# Patient Record
Sex: Female | Born: 1965 | Race: Black or African American | Hispanic: No | Marital: Single | Smoking: Former smoker
Health system: Southern US, Community
[De-identification: ages and names within clinical notes are randomized; demographics above are authoritative.]

## PROBLEM LIST (undated history)

## (undated) ENCOUNTER — Encounter

## (undated) ENCOUNTER — Ambulatory Visit

## (undated) ENCOUNTER — Encounter: Attending: Pulmonary Disease | Primary: Pulmonary Disease

## (undated) ENCOUNTER — Ambulatory Visit: Payer: MEDICARE

## (undated) ENCOUNTER — Telehealth

## (undated) ENCOUNTER — Telehealth: Attending: Family | Primary: Family

## (undated) ENCOUNTER — Ambulatory Visit: Attending: Pharmacist | Primary: Pharmacist

## (undated) ENCOUNTER — Encounter: Attending: Internal Medicine | Primary: Internal Medicine

## (undated) ENCOUNTER — Encounter: Attending: Family | Primary: Family

## (undated) ENCOUNTER — Ambulatory Visit: Payer: MEDICARE | Attending: Family | Primary: Family

## (undated) ENCOUNTER — Telehealth: Attending: Hospitalist | Primary: Hospitalist

## (undated) ENCOUNTER — Other Ambulatory Visit: Payer: MEDICARE

## (undated) ENCOUNTER — Encounter
Attending: Student in an Organized Health Care Education/Training Program | Primary: Student in an Organized Health Care Education/Training Program

## (undated) ENCOUNTER — Telehealth: Attending: Pulmonary Disease | Primary: Pulmonary Disease

## (undated) ENCOUNTER — Ambulatory Visit: Payer: MEDICARE | Attending: Registered" | Primary: Registered"

## (undated) ENCOUNTER — Ambulatory Visit: Payer: MEDICARE | Attending: Pulmonary Disease | Primary: Pulmonary Disease

## (undated) ENCOUNTER — Ambulatory Visit: Payer: MEDICARE | Attending: Hematology & Oncology | Primary: Hematology & Oncology

## (undated) ENCOUNTER — Encounter: Attending: Registered" | Primary: Registered"

## (undated) ENCOUNTER — Ambulatory Visit: Payer: MEDICARE | Attending: Vascular & Interventional Radiology | Primary: Vascular & Interventional Radiology

## (undated) ENCOUNTER — Telehealth: Payer: MEDICARE

## (undated) ENCOUNTER — Encounter
Attending: Rehabilitative and Restorative Service Providers" | Primary: Rehabilitative and Restorative Service Providers"

## (undated) ENCOUNTER — Ambulatory Visit
Payer: MEDICARE | Attending: Student in an Organized Health Care Education/Training Program | Primary: Student in an Organized Health Care Education/Training Program

## (undated) ENCOUNTER — Ambulatory Visit: Payer: MEDICARE | Attending: Nephrology | Primary: Nephrology

## (undated) ENCOUNTER — Ambulatory Visit: Payer: MEDICARE | Attending: Clinical | Primary: Clinical

## (undated) ENCOUNTER — Encounter: Attending: Anesthesiology | Primary: Anesthesiology

## (undated) ENCOUNTER — Inpatient Hospital Stay

## (undated) DIAGNOSIS — J449 Chronic obstructive pulmonary disease, unspecified: Secondary | ICD-10-CM

## (undated) DIAGNOSIS — D869 Sarcoidosis, unspecified: Secondary | ICD-10-CM

## (undated) DIAGNOSIS — E119 Type 2 diabetes mellitus without complications: Secondary | ICD-10-CM

## (undated) DIAGNOSIS — I10 Essential (primary) hypertension: Secondary | ICD-10-CM

## (undated) DIAGNOSIS — B449 Aspergillosis, unspecified: Secondary | ICD-10-CM

## (undated) HISTORY — PX: LUNG REMOVAL, PARTIAL: SHX233

## (undated) MED ORDER — ASPIRIN 81 MG TABLET,DELAYED RELEASE: Freq: Every day | ORAL | 0 days

## (undated) MED ORDER — HYDROCHLOROTHIAZIDE 25 MG TABLET
Freq: Every day | ORAL | 0.00000 days
Start: ? — End: 2020-03-01

## (undated) MED ORDER — TREPROSTINIL 1.74 MG/2.9 ML (0.6 MG/ML) SOLUTION FOR NEBULIZATION: Freq: Four times a day (QID) | RESPIRATORY_TRACT | 0.00000 days

## (undated) MED ORDER — AMLODIPINE 5 MG TABLET: Freq: Every day | ORAL | 0 days

## (undated) MED ORDER — CYCLOBENZAPRINE 10 MG TABLET: 10 mg | tablet | Freq: Three times a day (TID) | 0 refills | 30 days

---
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
## 2005-10-01 ENCOUNTER — Emergency Department: Payer: Self-pay | Admitting: Internal Medicine

## 2006-01-29 ENCOUNTER — Emergency Department: Payer: Self-pay | Admitting: Emergency Medicine

## 2006-11-27 ENCOUNTER — Other Ambulatory Visit: Payer: Self-pay

## 2006-11-27 ENCOUNTER — Emergency Department: Payer: Self-pay | Admitting: Emergency Medicine

## 2007-03-19 ENCOUNTER — Emergency Department: Payer: Self-pay | Admitting: Emergency Medicine

## 2007-12-26 ENCOUNTER — Other Ambulatory Visit: Payer: Self-pay

## 2007-12-26 ENCOUNTER — Inpatient Hospital Stay: Payer: Self-pay | Admitting: Specialist

## 2008-10-16 ENCOUNTER — Emergency Department: Payer: Self-pay | Admitting: Emergency Medicine

## 2009-02-14 ENCOUNTER — Emergency Department: Payer: Self-pay | Admitting: Emergency Medicine

## 2009-02-18 ENCOUNTER — Inpatient Hospital Stay: Payer: Self-pay | Admitting: Student

## 2009-03-26 ENCOUNTER — Ambulatory Visit: Payer: Self-pay | Admitting: Specialist

## 2009-05-17 ENCOUNTER — Ambulatory Visit: Payer: Self-pay | Admitting: Specialist

## 2009-07-30 ENCOUNTER — Other Ambulatory Visit: Payer: Self-pay | Admitting: Specialist

## 2009-08-09 ENCOUNTER — Ambulatory Visit: Payer: Self-pay | Admitting: Specialist

## 2009-11-19 ENCOUNTER — Emergency Department: Payer: Self-pay | Admitting: Unknown Physician Specialty

## 2011-03-02 ENCOUNTER — Ambulatory Visit: Payer: Self-pay

## 2013-11-13 ENCOUNTER — Emergency Department: Payer: Self-pay | Admitting: Emergency Medicine

## 2014-05-13 ENCOUNTER — Emergency Department: Payer: Self-pay | Admitting: Emergency Medicine

## 2014-05-29 ENCOUNTER — Emergency Department: Payer: Self-pay | Admitting: Emergency Medicine

## 2014-11-14 ENCOUNTER — Inpatient Hospital Stay
Admission: EM | Admit: 2014-11-14 | Discharge: 2014-11-15 | DRG: 204 | Disposition: A | Discharge status: Other Acute Inpatient Hospital | Payer: Medicare Other | Attending: Internal Medicine | Admitting: Internal Medicine

## 2014-11-14 ENCOUNTER — Emergency Department: Payer: Medicare Other

## 2014-11-14 ENCOUNTER — Encounter: Payer: Self-pay | Admitting: Emergency Medicine

## 2014-11-14 DIAGNOSIS — I10 Essential (primary) hypertension: Secondary | ICD-10-CM | POA: Diagnosis present

## 2014-11-14 DIAGNOSIS — R042 Hemoptysis: Secondary | ICD-10-CM | POA: Diagnosis not present

## 2014-11-14 DIAGNOSIS — E119 Type 2 diabetes mellitus without complications: Secondary | ICD-10-CM | POA: Diagnosis present

## 2014-11-14 DIAGNOSIS — J189 Pneumonia, unspecified organism: Secondary | ICD-10-CM | POA: Diagnosis present

## 2014-11-14 DIAGNOSIS — J449 Chronic obstructive pulmonary disease, unspecified: Secondary | ICD-10-CM | POA: Diagnosis present

## 2014-11-14 DIAGNOSIS — D860 Sarcoidosis of lung: Secondary | ICD-10-CM | POA: Diagnosis present

## 2014-11-14 HISTORY — DX: Sarcoidosis, unspecified: D86.9

## 2014-11-14 HISTORY — DX: Chronic obstructive pulmonary disease, unspecified: J44.9

## 2014-11-14 HISTORY — DX: Essential (primary) hypertension: I10

## 2014-11-14 HISTORY — DX: Type 2 diabetes mellitus without complications: E11.9

## 2014-11-14 HISTORY — DX: Aspergillosis, unspecified: B44.9

## 2014-11-14 LAB — COMPREHENSIVE METABOLIC PANEL
ALBUMIN: 3.9 g/dL (ref 3.5–5.0)
ALK PHOS: 66 U/L (ref 38–126)
ALT: 18 U/L (ref 14–54)
ANION GAP: 8 (ref 5–15)
AST: 21 U/L (ref 15–41)
BUN: 29 mg/dL — ABNORMAL HIGH (ref 6–20)
CALCIUM: 8.7 mg/dL — AB (ref 8.9–10.3)
CO2: 27 mmol/L (ref 22–32)
Chloride: 102 mmol/L (ref 101–111)
Creatinine, Ser: 1.4 mg/dL — ABNORMAL HIGH (ref 0.44–1.00)
GFR calc Af Amer: 49 mL/min — ABNORMAL LOW (ref >60)
GFR calc non Af Amer: 43 mL/min — ABNORMAL LOW (ref >60)
GLUCOSE: 168 mg/dL — AB (ref 65–99)
Potassium: 4.8 mmol/L (ref 3.5–5.1)
SODIUM: 137 mmol/L (ref 135–145)
Total Bilirubin: 0.6 mg/dL (ref 0.3–1.2)
Total Protein: 7.6 g/dL (ref 6.5–8.1)

## 2014-11-14 LAB — CBC WITH DIFFERENTIAL/PLATELET
BASOS PCT: 1 %
Basophils Absolute: 0 10*3/uL (ref 0–0.1)
EOS ABS: 0.2 10*3/uL (ref 0–0.7)
Eosinophils Relative: 5 %
HCT: 34.9 % — ABNORMAL LOW (ref 35.0–47.0)
HEMOGLOBIN: 11.4 g/dL — AB (ref 12.0–16.0)
Lymphocytes Relative: 31 %
Lymphs Abs: 1.4 10*3/uL (ref 1.0–3.6)
MCH: 26.5 pg (ref 26.0–34.0)
MCHC: 32.7 g/dL (ref 32.0–36.0)
MCV: 81.2 fL (ref 80.0–100.0)
Monocytes Absolute: 0.8 10*3/uL (ref 0.2–0.9)
Monocytes Relative: 17 %
NEUTROS PCT: 46 %
Neutro Abs: 2.1 10*3/uL (ref 1.4–6.5)
Platelets: 199 10*3/uL (ref 150–440)
RBC: 4.29 MIL/uL (ref 3.80–5.20)
RDW: 13.3 % (ref 11.5–14.5)
WBC: 4.5 10*3/uL (ref 3.6–11.0)

## 2014-11-14 MED ORDER — IOHEXOL 300 MG/ML  SOLN
60.0000 mL | Freq: Once | INTRAMUSCULAR | Status: AC | PRN
  Administered 2014-11-14: 60 mL via INTRAVENOUS

## 2014-11-14 MED ORDER — GLIPIZIDE ER 5 MG PO TB24
5.0000 mg | ORAL_TABLET | Freq: Every day | ORAL | Status: DC
  Administered 2014-11-15: 5 mg via ORAL
  Filled 2014-11-14 (×3): qty 1

## 2014-11-14 MED ORDER — HYDROCHLOROTHIAZIDE 25 MG PO TABS
25.0000 mg | ORAL_TABLET | Freq: Every day | ORAL | Status: DC
  Administered 2014-11-15: 25 mg via ORAL
  Filled 2014-11-14: qty 1

## 2014-11-14 MED ORDER — LISINOPRIL 5 MG PO TABS
5.0000 mg | ORAL_TABLET | Freq: Once | ORAL | Status: AC
  Administered 2014-11-15: 5 mg via ORAL

## 2014-11-14 MED ORDER — METFORMIN HCL 500 MG PO TABS
1000.0000 mg | ORAL_TABLET | Freq: Two times a day (BID) | ORAL | Status: DC
  Filled 2014-11-14: qty 2

## 2014-11-14 MED ORDER — MOMETASONE FURO-FORMOTEROL FUM 100-5 MCG/ACT IN AERO
2.0000 | INHALATION_SPRAY | Freq: Two times a day (BID) | RESPIRATORY_TRACT | Status: DC
  Administered 2014-11-15 (×2): 2 via RESPIRATORY_TRACT
  Filled 2014-11-14: qty 8.8

## 2014-11-14 MED ORDER — GABAPENTIN 300 MG PO CAPS
300.0000 mg | ORAL_CAPSULE | Freq: Three times a day (TID) | ORAL | Status: DC
  Administered 2014-11-15 (×3): 300 mg via ORAL
  Filled 2014-11-14 (×3): qty 1

## 2014-11-14 MED ORDER — IPRATROPIUM BROMIDE HFA 17 MCG/ACT IN AERS: 2.0000 | INHALATION_SPRAY | Freq: Four times a day (QID) | RESPIRATORY_TRACT | Status: DC

## 2014-11-14 MED ORDER — SIMVASTATIN 5 MG PO TABS
5.0000 mg | ORAL_TABLET | Freq: Every day | ORAL | Status: DC
  Administered 2014-11-15: 5 mg via ORAL
  Filled 2014-11-14 (×2): qty 1

## 2014-11-14 MED ORDER — IPRATROPIUM BROMIDE 0.02 % IN SOLN
0.5000 mg | Freq: Four times a day (QID) | RESPIRATORY_TRACT | Status: DC
  Administered 2014-11-15 (×3): 0.5 mg via RESPIRATORY_TRACT
  Filled 2014-11-14 (×3): qty 2.5

## 2014-11-14 NOTE — ED Notes (Signed)
States ate a salad with red and yellow peppers on it about 40 mins ago. Laid down to sleep and awoke with blood on pillow. Pt states she was crying and upset on seeing it and became nauseated and vomited once. Also has itching hands.

## 2014-11-14 NOTE — ED Notes (Signed)
Pharm called for meds  

## 2014-11-14 NOTE — ED Notes (Signed)
Dr Karma Greaser calling transfer center for update on transfer to [facility]

## 2014-11-14 NOTE — ED Notes (Signed)
Requested hospital bed for pt, but pt declined. Pt given bag lunch with ginger ale. Pt states she only takes her metformin at night = but it is on hold for 48 hrs after her ct.

## 2014-11-14 NOTE — ED Provider Notes (Addendum)
[facility] Emergency Department Provider Note  ____________________________________________  Time seen: Approximately 4:21 PM  I have reviewed the triage vital signs and the nursing notes.   HISTORY  Chief Complaint Allergic Reaction    HPI Charlotte Cross is a 49 y.o. female with a history of diabetes, hypertension, COPD, and sarcoidosis with all of her care at [facility] who presents with acute onset of hemoptysis and itching of her hands.  She states that within the last couple of hours she ate a salad with red and yellow peppers which were new foods for her.  She went down for a nap about 40 minutes ago and then awoke to find blood on her pillow.  She has been coughing hard and sees bright red blood when she coughs.  She is also having itching of her hands and thinks that this is an allergic reaction to the peppers.  Her main concern, however, is the hemoptysis.  She called EMS and they put in an IV and gave her Benadryl 50 mg.She is still scratching at her palms during my evaluation and has an emesis bag with bright red blood in it.  She states that she has not coughed up blood in the past so this is new and scary for her.  She adamantly states, however, that she is having no shortness of breath and has no pain "anywhere".  She had one episode of vomiting after she got scared when she saw the bright red blood but currently she is not nauseated.   Past Medical History  Diagnosis Date  . Diabetes mellitus without complication   . Hypertension   . Sarcoidosis   . COPD (chronic obstructive pulmonary disease)   . Aspergillosis     There are no active problems to display for this patient.   History reviewed. No pertinent past surgical history.  No current outpatient prescriptions on file.  Allergies Review of patient's allergies indicates no known allergies.  History reviewed. No pertinent family history.  Social History Social History  Substance Use  Topics  . Smoking status: Never Smoker   . Smokeless tobacco: None  . Alcohol Use: No    Review of Systems Constitutional: No fever/chills Eyes: No visual changes. ENT: No sore throat. Cardiovascular: Denies chest pain. Respiratory: Denies shortness of breath.  Coughing up bright red blood frequently Gastrointestinal: No abdominal pain.  No nausea, no vomiting.  No diarrhea.  No constipation. Genitourinary: Negative for dysuria. Musculoskeletal: Negative for back pain. Skin: Negative for rash. Neurological: Negative for headaches, focal weakness or numbness.  10-point ROS otherwise negative.  ____________________________________________   PHYSICAL EXAM:  ED Triage Vitals  Enc Vitals Group     BP 11/14/14 1621 168/97 mmHg     Pulse Rate 11/14/14 1621 83     Resp 11/14/14 1621 16     Temp 11/14/14 1621 98.2 F (36.8 C)     Temp Source 11/14/14 1621 Oral     SpO2 11/14/14 1621 97 %     Weight 11/14/14 1621 213 lb (96.616 kg)     Height 11/14/14 1621 6' (1.829 m)     Head Cir --      Peak Flow --      Pain Score 11/14/14 1638 0     Pain Loc --      Pain Edu? --      Excl. in [place]? --      Constitutional: Alert and oriented. Well appearing and in no acute distress. Eyes:  Conjunctivae are normal. PERRL. EOMI. Head: Atraumatic. Nose: No congestion/rhinnorhea. Mouth/Throat: Mucous membranes are moist.  Oropharynx non-erythematous. Neck: No stridor.   Cardiovascular: Normal rate, regular rhythm. Grossly normal heart sounds.  Good peripheral circulation. Respiratory: Normal respiratory effort.  No retractions. Lungs CTAB.  No wheezing rales or rhonchi.  Emesis bag is present at bedside with bright red blood Gastrointestinal: Soft and nontender. No distention. No abdominal bruits. No CVA tenderness. Musculoskeletal: No lower extremity tenderness nor edema.  No joint effusions. Neurologic:  Normal speech and language. No gross focal neurologic deficits are appreciated.   Skin:  Skin is warm, dry and intact. No rash noted. Psychiatric: Mood and affect are normal. Speech and behavior are normal.  ____________________________________________   LABS (all labs ordered are listed, but only abnormal results are displayed)  Labs Reviewed  CBC WITH DIFFERENTIAL/PLATELET - Abnormal; Notable for the following:    Hemoglobin 11.4 (*)    HCT 34.9 (*)    All other components within normal limits  COMPREHENSIVE METABOLIC PANEL - Abnormal; Notable for the following:    Glucose, Bld 168 (*)    BUN 29 (*)    Creatinine, Ser 1.40 (*)    Calcium 8.7 (*)    GFR calc non Af Amer 43 (*)    GFR calc Af Amer 49 (*)    All other components within normal limits   ____________________________________________  EKG  Not indicated ____________________________________________  RADIOLOGY   Dg Chest 2 View  11/14/2014   CLINICAL DATA:  Vomiting blood today. History of hypertension and COPD. Former smoker.  EXAM: CHEST  2 VIEW  COMPARISON:  05/29/2014.  FINDINGS: Cardiac silhouette is normal in size and configuration. No mediastinal or hilar masses.  There is prominence of the pulmonary arteries bilaterally, stable. There are areas of scarring, most evident in the left upper lobe associated pleural thickening. Additional coarse reticular scarring is noted in both lung bases and in the right apex. There changes of emphysema at the apices. No acute lung consolidation or evidence of edema. No pleural effusion or pneumothorax. Have skeletal structures are unremarkable.  IMPRESSION: 1. No acute cardiopulmonary disease. 2. Significant areas of lung scarring stable the prior study.   Electronically Signed   By: Lajean Manes M.D.   On: 11/14/2014 16:57   Ct Chest W Contrast  11/14/2014   CLINICAL DATA:  Allergic reaction to unknown source, a chain, coughing, hemoptysis, history of sarcoidosis, aspergillosis, hypertension, COPD, diabetes mellitus  EXAM: CT CHEST WITH CONTRAST  TECHNIQUE:  Multidetector CT imaging of the chest was performed during intravenous contrast administration. Sagittal and coronal MPR images reconstructed from axial data set.  CONTRAST:  37m OMNIPAQUE IOHEXOL 300 MG/ML  SOLN IV  COMPARISON:  08/09/2009 ; correlation chest radiograph 11/14/2014  FINDINGS: Aorta normal caliber with minimal atherosclerotic calcification.  Extensive mediastinal and hilar hilar adenopathy.  Prevascular node RIGHT superior mediastinum 2.7 cm short axis image 23 previously 2.1 cm.  Subcarinal node 2.9 cm short axis image 30 previously 2.6 cm.  LEFT hilar node 2.0 cm short axis image 26 previously 1.9 cm.  RIGHT hilar node 2.4 cm short axis image 27 previously 2.2 cm.  A few of the visualized lymph nodes now demonstrate central calcifications.  Visualized upper abdomen normal.  No acute osseous findings.  Severe parenchymal scarring and volume loss in both upper lobes with areas of honeycomb formation.  Large cavitary lesion RIGHT apex has increased in size now 6.0 x 5.8 x 4.2 cm image 12.  Large cavitary lesion LEFT apex has increased in size now 6.4 x 5.6 x 5.5 cm image 12, and now contains both an air-fluid level as well as a slightly larger area of mural nodularity inferiorly measuring 2.2 x 1.4 cm.  Mild scarring and honeycomb formation in RIGHT middle lobe and minimally in both lower lobes.  Scattered interstitial changes in lingula again identified.  No additional mass, nodule, infiltrate or effusion.  IMPRESSION: Mildly increased thoracic adenopathy consistent with history of sarcoidosis, with some of the nodes now demonstrating small parenchymal calcifications.  Severe parenchymal lung disease/scarring predominantly involving the upper lobes consistent with sarcoidosis but also seen in the RIGHT middle lobe and in the lower lobes to a lesser degree.  Significant increases in sizes of biapical cavitary foci with the cavitary focus at the LEFT apex containing an air-fluid level question  superinfection. And an area of increased  Mural nodularity within the LEFT apical cavitary focus has slightly increased as well question aspergilloma, less likely related to hemorrhage or debris.   Electronically Signed   By: Lavonia Dana M.D.   On: 11/14/2014 18:42    ____________________________________________   PROCEDURES  Procedure(s) performed: None  Critical Care performed: No ____________________________________________   INITIAL IMPRESSION / ASSESSMENT AND PLAN / ED COURSE  Pertinent labs & imaging results that were available during my care of the patient were reviewed by me and considered in my medical decision making (see chart for details).  Given the patient's history of sarcoidosis and aspergillosis, a first evaluated with a two-view chest x-ray and then proceeded with a CT of the chest with IV contrast for careful evaluation of her lungs.  There were notable for significant interval changes from her last CT scan with large cavitary lesions including a fluid-filled focus.  I doubt the possibility of superinfection as suggested by the radiology report given how well-appearing the patient is with normal vital signs, respiratory effort, and no leukocytosis.  However, she is complicated from a pulmonary standpoint and I contacted the pulmonologist at [facility].  I spoke by phone with Dr. Normajean Baxter, the on-call pulmonologist.  He felt that it would be in the patient's best interest to transfer her to [facility] for further management of her hemoptysis.  I discussed with [facility] transfer center and they confirmed that Dr. Normajean Baxter has accepted the patient directly to a floor bed and she will be transferred when a bed is signed.  I updated the patient and family of this.  She remains in no acute distress but is continuing to cough up a teaspoon-or-less sized amounts of blood each time she coughs.  Of note, the patient's itching completely resolved after a dose of Benadryl and that has ceased to be an  issue.  ----------------------------------------- 9:36 PM on 11/14/2014 -----------------------------------------  I was just informed that while she has been accepted to [facility], there is currently no bed, she is on a wait list, and she may have to be here until tomorrow.  I contacted the [facility] transfer center and discussed the issue.  They are checking to see if she can be taken to a different service given the importance of getting her to her destination facility.  I encouraged them to please do anything possible to find a bed for her on any given service including considering Med [facility] or [facility]/W.  ----------------------------------------- 10:22 PM on 11/14/2014 -----------------------------------------  The patient and the pharmacy technician verified all the patient's medications.  I have ordered all of her daily medications  except for her daily baby aspirin given her new hemoptysis.  She remains afebrile, does not have any tachycardia, and feels fine except for the persistent hemoptysis.  I am going to continue holding on any antibiosis because she does not appear to have any infection.  I did discuss this by phone with Dr. Normajean Baxter who did not feel strongly about starting antibiotics given the patient's well-appearance. ____________________________________________  FINAL CLINICAL IMPRESSION(S) / ED DIAGNOSES  Final diagnoses:  Hemoptysis  Sarcoidosis of lung      NEW MEDICATIONS STARTED DURING THIS VISIT:  New Prescriptions   No medications on file     Hinda Kehr, MD 11/14/14 [id number]  Hinda Kehr, MD 11/14/14 [id number]  Hinda Kehr, MD 11/14/14 2224

## 2014-11-15 DIAGNOSIS — D860 Sarcoidosis of lung: Secondary | ICD-10-CM | POA: Diagnosis present

## 2014-11-15 DIAGNOSIS — R042 Hemoptysis: Secondary | ICD-10-CM | POA: Diagnosis present

## 2014-11-15 DIAGNOSIS — J449 Chronic obstructive pulmonary disease, unspecified: Secondary | ICD-10-CM | POA: Diagnosis present

## 2014-11-15 DIAGNOSIS — J189 Pneumonia, unspecified organism: Secondary | ICD-10-CM | POA: Diagnosis present

## 2014-11-15 DIAGNOSIS — I10 Essential (primary) hypertension: Secondary | ICD-10-CM | POA: Diagnosis present

## 2014-11-15 DIAGNOSIS — E119 Type 2 diabetes mellitus without complications: Secondary | ICD-10-CM | POA: Diagnosis present

## 2014-11-15 LAB — BASIC METABOLIC PANEL
ANION GAP: 9 (ref 5–15)
BUN: 21 mg/dL — AB (ref 6–20)
CHLORIDE: 102 mmol/L (ref 101–111)
CO2: 28 mmol/L (ref 22–32)
Calcium: 9 mg/dL (ref 8.9–10.3)
Creatinine, Ser: 1.27 mg/dL — ABNORMAL HIGH (ref 0.44–1.00)
GFR calc Af Amer: 56 mL/min — ABNORMAL LOW (ref >60)
GFR calc non Af Amer: 48 mL/min — ABNORMAL LOW (ref >60)
GLUCOSE: 154 mg/dL — AB (ref 65–99)
POTASSIUM: 4.5 mmol/L (ref 3.5–5.1)
Sodium: 139 mmol/L (ref 135–145)

## 2014-11-15 LAB — CBC WITH DIFFERENTIAL/PLATELET
BASOS ABS: 0.1 10*3/uL (ref 0–0.1)
Basophils Relative: 1 %
EOS PCT: 6 %
Eosinophils Absolute: 0.3 10*3/uL (ref 0–0.7)
HEMATOCRIT: 35.1 % (ref 35.0–47.0)
Hemoglobin: 11.3 g/dL — ABNORMAL LOW (ref 12.0–16.0)
LYMPHS ABS: 1.2 10*3/uL (ref 1.0–3.6)
LYMPHS PCT: 22 %
MCH: 26.4 pg (ref 26.0–34.0)
MCHC: 32.2 g/dL (ref 32.0–36.0)
MCV: 82.1 fL (ref 80.0–100.0)
MONO ABS: 0.6 10*3/uL (ref 0.2–0.9)
Monocytes Relative: 12 %
NEUTROS ABS: 3.1 10*3/uL (ref 1.4–6.5)
Neutrophils Relative %: 59 %
PLATELETS: 199 10*3/uL (ref 150–440)
RBC: 4.27 MIL/uL (ref 3.80–5.20)
RDW: 13.3 % (ref 11.5–14.5)
WBC: 5.3 10*3/uL (ref 3.6–11.0)

## 2014-11-15 MED ORDER — LEVOFLOXACIN 750 MG PO TABS
750.0000 mg | ORAL_TABLET | Freq: Once | ORAL | Status: AC
  Administered 2014-11-15: 750 mg via ORAL
  Filled 2014-11-15: qty 1

## 2014-11-15 MED ORDER — LISINOPRIL 20 MG PO TABS
ORAL_TABLET | ORAL | Status: AC
  Filled 2014-11-15: qty 1

## 2014-11-15 MED ORDER — PREDNISONE 20 MG PO TABS
40.0000 mg | ORAL_TABLET | Freq: Once | ORAL | Status: AC
  Administered 2014-11-15: 40 mg via ORAL
  Filled 2014-11-15: qty 2

## 2014-11-15 NOTE — ED Notes (Signed)
Patient given bag lunch and ginger ale.  Currently still waiting on bed at [facility] for transfer

## 2014-11-15 NOTE — ED Provider Notes (Addendum)
I accepted care at 3 PM at shift change. Patient is awaiting bed placement at [facility] where her pulmonologist is located. I reviewed the chart from the original evaluating emergency physician Dr. Karma Greaser, as well as the workup and evaluation including the chest CT. Patient is here for persistent hemoptysis in the setting of known asperigillosis and sarcoidosis of the lung. She does wear home O2 2 L. She is not reporting any additional shortness of breath today.  Patient is continuing to cough up mucus with bloody clots throughout the night and today.  I had our ER secretary check with the admission/transfer coordinator at [facility] who was unable to say when a bed might be available, however was still stating there may be a bed available "today."   I send out a page to [facility]/accepting pulmonologist and awaiting a phone call for any additional recommendations.  I reviewed the patient's vital signs which have been stable overnight and today. I reviewed the patient's laboratory studies and have added a redraw of the CBC and met B.    ----------------------------------------- 4:07 PM on 11/15/2014 -----------------------------------------  I discussed with Dr. Normajean Baxter, [facility] pulmonologist fellow, who consulted yesterday on this case and accepted the patient in transfer, and although he is on on-call, the page did go to him at home and he is providing recommendations.  I updated him that the patient does have ongoing small but consistent hemoptysis. She's had stable vital signs. I am awaiting a repeat hemoglobin, and other labs. We again discussed that the labs and recommendation yesterday were less suspicious for new pulmonary infection, however given the patient is delayed until her bronchoscopy which would tell for certain about any additional infection, he is recommending starting her on antibiotic, Levaquin. He is also recommended treating sarcoid exacerbation with prednisone, 40 mg by mouth daily.  Dr. Normajean Baxter  stated he would call the on-call Pulmonologist and update them on this discussion and plan.  I will have our secretary call about a bed assignment before the end of the day.  ----------------------------------------- 6:21 PM on 11/15/2014 -----------------------------------------  Our secretary called again and there was no up-to-date on when a bed at [facility] might be available. Because of this delay until the patient needs to be admitted to the hospital here. She could receive care and a bronchoscopy and an ID consult if needed here in our hospital. Patient is agreeable to this plan. I discussed the case with the hospitalist Dr. Edwina Barth who will admit the patient.  I reviewed the labs and her white blood cell count is not elevated with comparison to yesterday, and her hemoglobin is stable.  ----------------------------------------- 7:51 PM on 11/15/2014 -----------------------------------------  I was updated that [facility] had a bed available for the patient for admission. I canceled the admission here in the hospital with Dr. Edwina Barth, and patient will be transferred on to [place], MD 11/15/14 Vernelle Emerald  Lisa Roca, MD 11/15/14 [phone]

## 2014-11-15 NOTE — Progress Notes (Signed)
Patient ID: Charlotte Cross, female   DOB: July 11, 1965, 49 y.o.   MRN: [id number]  Have been told by transfer center that patient now has a bed at [facility]. Patient has not yet left the ED after being admitted. Will d/c admission orders. Pt being transferred to [facility] for further treatment.

## 2014-11-15 NOTE — ED Notes (Signed)
Pt currently eating breakfast tray

## 2014-11-15 NOTE — ED Notes (Signed)
Pt updated on plan to transfer to [facility]. Pt informed that it should be today. Currently on [facility] waitlist. Call bell in reach

## 2014-11-15 NOTE — H&P (Signed)
Charlotte Cross is an 49 y.o. female.   Chief Complaint: Coughing up blood HPI: Presented to the ED yesterday with sudden onset hemoptysis. Described as frank blood and a cup full in amount. Has history of sarcoidosis ans aspergillosis. Evaluated in ED and was accepted for transfer by Dr Normajean Baxter at [facility]. Later the ED found out no beds available. Since then pt has been in ED for 25 hrs and [facility] says no beds available anytime soon. So hospitalist were asked to admit patient.  Past Medical History  Diagnosis Date  . Diabetes mellitus without complication   . Hypertension   . Sarcoidosis   . COPD (chronic obstructive pulmonary disease)   . Aspergillosis     History reviewed. No pertinent past surgical history.  Bucosal biopsy  History reviewed. No pertinent family history.  Positive for sarcodosis Social History:  reports that she has never smoked. She does not have any smokeless tobacco history on file. She reports that she does not drink alcohol. Her drug history is not on file.  Allergies: No Known Allergies   (Not in a hospital admission)  Results for orders placed or performed during the hospital encounter of 11/14/14 (from the past 48 hour(s))  CBC with Differential/Platelet     Status: Abnormal   Collection Time: 11/14/14  4:33 PM  Result Value Ref Range   WBC 4.5 3.6 - 11.0 K/uL   RBC 4.29 3.80 - 5.20 MIL/uL   Hemoglobin 11.4 (L) 12.0 - 16.0 g/dL   HCT 34.9 (L) 35.0 - 47.0 %   MCV 81.2 80.0 - 100.0 fL   MCH 26.5 26.0 - 34.0 pg   MCHC 32.7 32.0 - 36.0 g/dL   RDW 13.3 11.5 - 14.5 %   Platelets 199 150 - 440 K/uL   Neutrophils Relative % 46 %   Neutro Abs 2.1 1.4 - 6.5 K/uL   Lymphocytes Relative 31 %   Lymphs Abs 1.4 1.0 - 3.6 K/uL   Monocytes Relative 17 %   Monocytes Absolute 0.8 0.2 - 0.9 K/uL   Eosinophils Relative 5 %   Eosinophils Absolute 0.2 0 - 0.7 K/uL   Basophils Relative 1 %   Basophils Absolute 0.0 0 - 0.1 K/uL  Comprehensive metabolic panel      Status: Abnormal   Collection Time: 11/14/14  4:33 PM  Result Value Ref Range   Sodium 137 135 - 145 mmol/L   Potassium 4.8 3.5 - 5.1 mmol/L   Chloride 102 101 - 111 mmol/L   CO2 27 22 - 32 mmol/L   Glucose, Bld 168 (H) 65 - 99 mg/dL   BUN 29 (H) 6 - 20 mg/dL   Creatinine, Ser 1.40 (H) 0.44 - 1.00 mg/dL   Calcium 8.7 (L) 8.9 - 10.3 mg/dL   Total Protein 7.6 6.5 - 8.1 g/dL   Albumin 3.9 3.5 - 5.0 g/dL   AST 21 15 - 41 U/L   ALT 18 14 - 54 U/L   Alkaline Phosphatase 66 38 - 126 U/L   Total Bilirubin 0.6 0.3 - 1.2 mg/dL   GFR calc non Af Amer 43 (L) >60 mL/min   GFR calc Af Amer 49 (L) >60 mL/min    Comment: (NOTE) The eGFR has been calculated using the CKD EPI equation. This calculation has not been validated in all clinical situations. eGFR's persistently <60 mL/min signify possible Chronic Kidney Disease.    Anion gap 8 5 - 15  CBC with Differential  Status: Abnormal   Collection Time: 11/15/14  3:48 PM  Result Value Ref Range   WBC 5.3 3.6 - 11.0 K/uL   RBC 4.27 3.80 - 5.20 MIL/uL   Hemoglobin 11.3 (L) 12.0 - 16.0 g/dL   HCT 35.1 35.0 - 47.0 %   MCV 82.1 80.0 - 100.0 fL   MCH 26.4 26.0 - 34.0 pg   MCHC 32.2 32.0 - 36.0 g/dL   RDW 13.3 11.5 - 14.5 %   Platelets 199 150 - 440 K/uL   Neutrophils Relative % 59 %   Neutro Abs 3.1 1.4 - 6.5 K/uL   Lymphocytes Relative 22 %   Lymphs Abs 1.2 1.0 - 3.6 K/uL   Monocytes Relative 12 %   Monocytes Absolute 0.6 0.2 - 0.9 K/uL   Eosinophils Relative 6 %   Eosinophils Absolute 0.3 0 - 0.7 K/uL   Basophils Relative 1 %   Basophils Absolute 0.1 0 - 0.1 K/uL  Basic metabolic panel     Status: Abnormal   Collection Time: 11/15/14  3:48 PM  Result Value Ref Range   Sodium 139 135 - 145 mmol/L   Potassium 4.5 3.5 - 5.1 mmol/L   Chloride 102 101 - 111 mmol/L   CO2 28 22 - 32 mmol/L   Glucose, Bld 154 (H) 65 - 99 mg/dL   BUN 21 (H) 6 - 20 mg/dL   Creatinine, Ser 1.27 (H) 0.44 - 1.00 mg/dL   Calcium 9.0 8.9 - 10.3 mg/dL   GFR  calc non Af Amer 48 (L) >60 mL/min   GFR calc Af Amer 56 (L) >60 mL/min    Comment: (NOTE) The eGFR has been calculated using the CKD EPI equation. This calculation has not been validated in all clinical situations. eGFR's persistently <60 mL/min signify possible Chronic Kidney Disease.    Anion gap 9 5 - 15   Dg Chest 2 View  11/14/2014   CLINICAL DATA:  Vomiting blood today. History of hypertension and COPD. Former smoker.  EXAM: CHEST  2 VIEW  COMPARISON:  05/29/2014.  FINDINGS: Cardiac silhouette is normal in size and configuration. No mediastinal or hilar masses.  There is prominence of the pulmonary arteries bilaterally, stable. There are areas of scarring, most evident in the left upper lobe associated pleural thickening. Additional coarse reticular scarring is noted in both lung bases and in the right apex. There changes of emphysema at the apices. No acute lung consolidation or evidence of edema. No pleural effusion or pneumothorax. Have skeletal structures are unremarkable.  IMPRESSION: 1. No acute cardiopulmonary disease. 2. Significant areas of lung scarring stable the prior study.   Electronically Signed   By: Lajean Manes M.D.   On: 11/14/2014 16:57   Ct Chest W Contrast  11/14/2014   CLINICAL DATA:  Allergic reaction to unknown source, a chain, coughing, hemoptysis, history of sarcoidosis, aspergillosis, hypertension, COPD, diabetes mellitus  EXAM: CT CHEST WITH CONTRAST  TECHNIQUE: Multidetector CT imaging of the chest was performed during intravenous contrast administration. Sagittal and coronal MPR images reconstructed from axial data set.  CONTRAST:  32m OMNIPAQUE IOHEXOL 300 MG/ML  SOLN IV  COMPARISON:  08/09/2009 ; correlation chest radiograph 11/14/2014  FINDINGS: Aorta normal caliber with minimal atherosclerotic calcification.  Extensive mediastinal and hilar hilar adenopathy.  Prevascular node RIGHT superior mediastinum 2.7 cm short axis image 23 previously 2.1 cm.  Subcarinal  node 2.9 cm short axis image 30 previously 2.6 cm.  LEFT hilar node 2.0 cm short axis image  26 previously 1.9 cm.  RIGHT hilar node 2.4 cm short axis image 27 previously 2.2 cm.  A few of the visualized lymph nodes now demonstrate central calcifications.  Visualized upper abdomen normal.  No acute osseous findings.  Severe parenchymal scarring and volume loss in both upper lobes with areas of honeycomb formation.  Large cavitary lesion RIGHT apex has increased in size now 6.0 x 5.8 x 4.2 cm image 12.  Large cavitary lesion LEFT apex has increased in size now 6.4 x 5.6 x 5.5 cm image 12, and now contains both an air-fluid level as well as a slightly larger area of mural nodularity inferiorly measuring 2.2 x 1.4 cm.  Mild scarring and honeycomb formation in RIGHT middle lobe and minimally in both lower lobes.  Scattered interstitial changes in lingula again identified.  No additional mass, nodule, infiltrate or effusion.  IMPRESSION: Mildly increased thoracic adenopathy consistent with history of sarcoidosis, with some of the nodes now demonstrating small parenchymal calcifications.  Severe parenchymal lung disease/scarring predominantly involving the upper lobes consistent with sarcoidosis but also seen in the RIGHT middle lobe and in the lower lobes to a lesser degree.  Significant increases in sizes of biapical cavitary foci with the cavitary focus at the LEFT apex containing an air-fluid level question superinfection. And an area of increased  Mural nodularity within the LEFT apical cavitary focus has slightly increased as well question aspergilloma, less likely related to hemorrhage or debris.   Electronically Signed   By: Lavonia Dana M.D.   On: 11/14/2014 18:42    Review of Systems  Constitutional: Negative for fever and chills.  HENT: Negative for hearing loss.   Eyes: Negative for blurred vision.  Respiratory: Positive for cough and hemoptysis.   Cardiovascular: Negative for chest pain.   Gastrointestinal: Negative for nausea, vomiting and diarrhea.  Genitourinary: Negative for dysuria.  Musculoskeletal: Negative for back pain.  Skin: Negative for rash.  Neurological: Negative for dizziness and sensory change.  Endo/Heme/Allergies: Does not bruise/bleed easily.  Psychiatric/Behavioral: Negative for depression.    Blood pressure 114/66, pulse 85, temperature 98.2 F (36.8 C), temperature source Oral, resp. rate 18, height 6' (1.829 m), weight 96.616 kg (213 lb), last menstrual period 10/22/2014, SpO2 96 %. Physical Exam  Constitutional: She is oriented to person, place, and time. She appears well-developed and well-nourished. No distress.  HENT:  Head: Normocephalic and atraumatic.  Mouth/Throat: Oropharynx is clear and moist. No oropharyngeal exudate.  Eyes: EOM are normal. Pupils are equal, round, and reactive to light. No scleral icterus.  Neck: Neck supple. No JVD present. No tracheal deviation present. No thyromegaly present.  Cardiovascular: Normal rate.   No murmur heard. Respiratory: She has rales.  No dullness to percussion. No use of accessary muscles.  GI: Soft. Bowel sounds are normal. She exhibits no mass. There is no tenderness.  Musculoskeletal: She exhibits no edema or tenderness.  Lymphadenopathy:    She has no cervical adenopathy.  Neurological: She is alert and oriented to person, place, and time.  Cranial nerves 2-12 intact  Skin: Skin is warm and dry. No rash noted. No erythema.     Assessment/Plan 1. Hemoptysis: Improved some today from first presentation yesterday. More like blood tinged sputum. [facility] pulm has reccommended bronch for further dx, Will consult pulm.  2. Pneumonia: Chest CT shows possible superinfection in the lung. Treat with abx.  3. Sarcoidosis: Will treat with steriods.  4. DM: Hold metformin since just had contrasted CT. Start SSI.  Reviewed past medical records. Discussed case with Dr Reita Cliche and Dr Humphrey Rolls.  Time spent=  60 min  Baxter Hire 11/15/2014, 6:38 PM

## 2014-11-15 NOTE — ED Provider Notes (Signed)
-----------------------------------------   8:44 AM on 11/15/2014 -----------------------------------------  We have contacted the [facility] transfer center, the patient continues to be on a wait list for an available bed. They felt very confident that a bed would open up this morning and we would be able to transfer her. Patient continues to appear well, vitals continued to appear well. I discussed this with the patient who is agreeable, has finished eating her breakfast, no acute distress. We will continue to hold off on antibiotics given the patient's very well appearance, as per previous plan. We will continue to monitor the patient closely in the emergency department until a bed becomes available at [facility].  Harvest Dark, MD 11/15/14 [phone]

## 2014-11-15 NOTE — ED Notes (Signed)
Secretary spoke with [place] from [facility] who instructed [place] that the patient is still on the wait list.  Once they get a hold of house supervisor and get an update they will give [place] a call back.

## 2014-11-15 NOTE — ED Notes (Signed)
Admitting MD at bedside.

## 2014-12-24 ENCOUNTER — Observation Stay
Admission: EM | Admit: 2014-12-24 | Discharge: 2014-12-26 | Disposition: A | Discharge status: Home / Self Care | Payer: Medicare Other | Attending: Internal Medicine | Admitting: Internal Medicine

## 2014-12-24 ENCOUNTER — Emergency Department: Payer: Medicare Other

## 2014-12-24 ENCOUNTER — Encounter: Payer: Self-pay | Admitting: Emergency Medicine

## 2014-12-24 DIAGNOSIS — R0689 Other abnormalities of breathing: Secondary | ICD-10-CM

## 2014-12-24 DIAGNOSIS — Z79899 Other long term (current) drug therapy: Secondary | ICD-10-CM | POA: Insufficient documentation

## 2014-12-24 DIAGNOSIS — E11649 Type 2 diabetes mellitus with hypoglycemia without coma: Secondary | ICD-10-CM | POA: Diagnosis not present

## 2014-12-24 DIAGNOSIS — E162 Hypoglycemia, unspecified: Secondary | ICD-10-CM | POA: Diagnosis present

## 2014-12-24 DIAGNOSIS — A419 Sepsis, unspecified organism: Secondary | ICD-10-CM | POA: Diagnosis present

## 2014-12-24 DIAGNOSIS — J45909 Unspecified asthma, uncomplicated: Secondary | ICD-10-CM | POA: Diagnosis not present

## 2014-12-24 DIAGNOSIS — J449 Chronic obstructive pulmonary disease, unspecified: Secondary | ICD-10-CM | POA: Diagnosis not present

## 2014-12-24 DIAGNOSIS — X58XXXA Exposure to other specified factors, initial encounter: Secondary | ICD-10-CM | POA: Diagnosis not present

## 2014-12-24 DIAGNOSIS — Z833 Family history of diabetes mellitus: Secondary | ICD-10-CM | POA: Insufficient documentation

## 2014-12-24 DIAGNOSIS — D869 Sarcoidosis, unspecified: Secondary | ICD-10-CM | POA: Diagnosis not present

## 2014-12-24 DIAGNOSIS — Z7984 Long term (current) use of oral hypoglycemic drugs: Secondary | ICD-10-CM | POA: Diagnosis not present

## 2014-12-24 DIAGNOSIS — I10 Essential (primary) hypertension: Secondary | ICD-10-CM | POA: Diagnosis not present

## 2014-12-24 DIAGNOSIS — Z7901 Long term (current) use of anticoagulants: Secondary | ICD-10-CM | POA: Insufficient documentation

## 2014-12-24 DIAGNOSIS — Z7982 Long term (current) use of aspirin: Secondary | ICD-10-CM | POA: Insufficient documentation

## 2014-12-24 DIAGNOSIS — Z902 Acquired absence of lung [part of]: Secondary | ICD-10-CM | POA: Insufficient documentation

## 2014-12-24 DIAGNOSIS — T68XXXA Hypothermia, initial encounter: Secondary | ICD-10-CM | POA: Insufficient documentation

## 2014-12-24 DIAGNOSIS — E875 Hyperkalemia: Secondary | ICD-10-CM

## 2014-12-24 LAB — URINALYSIS COMPLETE WITH MICROSCOPIC (ARMC ONLY)
BILIRUBIN URINE: NEGATIVE
Bacteria, UA: NONE SEEN
GLUCOSE, UA: 150 mg/dL — AB
Ketones, ur: NEGATIVE mg/dL
LEUKOCYTES UA: NEGATIVE
NITRITE: NEGATIVE
PH: 5 (ref 5.0–8.0)
Protein, ur: NEGATIVE mg/dL
SPECIFIC GRAVITY, URINE: 1.013 (ref 1.005–1.030)

## 2014-12-24 LAB — BLOOD GAS, VENOUS
Acid-Base Excess: 0.5 mmol/L (ref 0.0–3.0)
Bicarbonate: 29.2 mEq/L — ABNORMAL HIGH (ref 21.0–28.0)
FIO2: 0.36
PCO2 VEN: 65 mmHg — AB (ref 44.0–60.0)
PO2 VEN: 28 mmHg — AB (ref 30.0–45.0)
Patient temperature: 37
pH, Ven: 7.26 — ABNORMAL LOW (ref 7.320–7.430)

## 2014-12-24 LAB — COMPREHENSIVE METABOLIC PANEL
ALBUMIN: 3.2 g/dL — AB (ref 3.5–5.0)
ALK PHOS: 103 U/L (ref 38–126)
ALT: 14 U/L (ref 14–54)
AST: 23 U/L (ref 15–41)
Anion gap: 7 (ref 5–15)
BILIRUBIN TOTAL: 0.4 mg/dL (ref 0.3–1.2)
BUN: 26 mg/dL — AB (ref 6–20)
CALCIUM: 9.3 mg/dL (ref 8.9–10.3)
CO2: 27 mmol/L (ref 22–32)
CREATININE: 0.97 mg/dL (ref 0.44–1.00)
Chloride: 103 mmol/L (ref 101–111)
GFR calc Af Amer: >60 mL/min (ref >60)
GFR calc non Af Amer: >60 mL/min (ref >60)
GLUCOSE: 56 mg/dL — AB (ref 65–99)
Potassium: 5.6 mmol/L — ABNORMAL HIGH (ref 3.5–5.1)
SODIUM: 137 mmol/L (ref 135–145)
TOTAL PROTEIN: 7.3 g/dL (ref 6.5–8.1)

## 2014-12-24 LAB — CBC WITH DIFFERENTIAL/PLATELET
BASOS ABS: 0 10*3/uL (ref 0–0.1)
BASOS PCT: 0 %
EOS ABS: 0.1 10*3/uL (ref 0–0.7)
Eosinophils Relative: 2 %
HEMATOCRIT: 29.3 % — AB (ref 35.0–47.0)
HEMOGLOBIN: 9.9 g/dL — AB (ref 12.0–16.0)
LYMPHS PCT: 7 %
Lymphs Abs: 0.5 10*3/uL — ABNORMAL LOW (ref 1.0–3.6)
MCH: 31.1 pg (ref 26.0–34.0)
MCHC: 33.7 g/dL (ref 32.0–36.0)
MCV: 92.3 fL (ref 80.0–100.0)
Monocytes Absolute: 0.4 10*3/uL (ref 0.2–0.9)
Monocytes Relative: 6 %
Neutro Abs: 6 10*3/uL (ref 1.4–6.5)
Neutrophils Relative %: 85 %
Platelets: 407 10*3/uL (ref 150–440)
RBC: 3.17 MIL/uL — ABNORMAL LOW (ref 3.80–5.20)
RDW: 14.9 % — AB (ref 11.5–14.5)
WBC: 7.1 10*3/uL (ref 3.6–11.0)

## 2014-12-24 LAB — GLUCOSE, CAPILLARY
GLUCOSE-CAPILLARY: 96 mg/dL (ref 65–99)
GLUCOSE-CAPILLARY: 69 mg/dL (ref 65–99)
GLUCOSE-CAPILLARY: 69 mg/dL (ref 65–99)
GLUCOSE-CAPILLARY: 40 mg/dL — AB (ref 65–99)
GLUCOSE-CAPILLARY: 71 mg/dL (ref 65–99)
Glucose-Capillary: 38 mg/dL — CL (ref 65–99)

## 2014-12-24 LAB — LACTIC ACID, PLASMA
Lactic Acid, Venous: 1.8 mmol/L (ref 0.5–2.0)
Lactic Acid, Venous: 1.6 mmol/L (ref 0.5–2.0)

## 2014-12-24 LAB — TROPONIN I: Troponin I: <0.03 ng/mL (ref <0.031)

## 2014-12-24 LAB — PROTIME-INR
INR: 0.89
PROTHROMBIN TIME: 12.3 s (ref 11.4–15.0)

## 2014-12-24 MED ORDER — SODIUM CHLORIDE 3 % IN NEBU
4.0000 mL | INHALATION_SOLUTION | Freq: Four times a day (QID) | RESPIRATORY_TRACT | Status: DC
  Administered 2014-12-25 – 2014-12-26 (×5): 4 mL via RESPIRATORY_TRACT
  Filled 2014-12-24 (×10): qty 4

## 2014-12-24 MED ORDER — PIPERACILLIN-TAZOBACTAM 4.5 G IVPB
4.5000 g | Freq: Once | INTRAVENOUS | Status: AC
  Administered 2014-12-24: 4.5 g via INTRAVENOUS
  Filled 2014-12-24: qty 100

## 2014-12-24 MED ORDER — VANCOMYCIN HCL 10 G IV SOLR
1500.0000 mg | Freq: Once | INTRAVENOUS | Status: DC
  Filled 2014-12-24 (×2): qty 1500

## 2014-12-24 MED ORDER — OXYCODONE HCL 5 MG PO TABS
10.0000 mg | ORAL_TABLET | Freq: Four times a day (QID) | ORAL | Status: DC | PRN
  Administered 2014-12-25 – 2014-12-26 (×3): 10 mg via ORAL
  Filled 2014-12-24 (×3): qty 2

## 2014-12-24 MED ORDER — ENOXAPARIN SODIUM 40 MG/0.4ML ~~LOC~~ SOLN
40.0000 mg | SUBCUTANEOUS | Status: DC
  Filled 2014-12-24 (×2): qty 0.4

## 2014-12-24 MED ORDER — DEXTROSE 50 % IV SOLN
1.0000 | Freq: Once | INTRAVENOUS | Status: AC
  Administered 2014-12-24: 50 mL via INTRAVENOUS
  Filled 2014-12-24: qty 50

## 2014-12-24 MED ORDER — VORICONAZOLE 50 MG PO TABS
300.0000 mg | ORAL_TABLET | Freq: Two times a day (BID) | ORAL | Status: DC
  Administered 2014-12-24 – 2014-12-25 (×3): 300 mg via ORAL
  Filled 2014-12-24 (×5): qty 2

## 2014-12-24 MED ORDER — ALBUTEROL SULFATE (2.5 MG/3ML) 0.083% IN NEBU: 2.5000 mg | INHALATION_SOLUTION | Freq: Four times a day (QID) | RESPIRATORY_TRACT | Status: DC | PRN

## 2014-12-24 MED ORDER — MINOCYCLINE HCL 100 MG PO CAPS
100.0000 mg | ORAL_CAPSULE | Freq: Two times a day (BID) | ORAL | Status: DC
  Administered 2014-12-24 – 2014-12-26 (×4): 100 mg via ORAL
  Filled 2014-12-24 (×5): qty 1

## 2014-12-24 MED ORDER — LISINOPRIL 5 MG PO TABS
5.0000 mg | ORAL_TABLET | Freq: Every day | ORAL | Status: DC
Start: 2014-12-24 — End: 2014-12-25
  Administered 2014-12-25: 5 mg via ORAL
  Filled 2014-12-24 (×2): qty 1

## 2014-12-24 MED ORDER — DOCUSATE SODIUM 100 MG PO CAPS
100.0000 mg | ORAL_CAPSULE | Freq: Two times a day (BID) | ORAL | Status: DC
  Administered 2014-12-25 – 2014-12-26 (×3): 100 mg via ORAL
  Filled 2014-12-24 (×4): qty 1

## 2014-12-24 MED ORDER — ASPIRIN EC 325 MG PO TBEC
325.0000 mg | DELAYED_RELEASE_TABLET | Freq: Every day | ORAL | Status: DC
  Administered 2014-12-25 – 2014-12-26 (×2): 325 mg via ORAL
  Filled 2014-12-24 (×3): qty 1

## 2014-12-24 MED ORDER — PRAVASTATIN SODIUM 40 MG PO TABS
40.0000 mg | ORAL_TABLET | Freq: Every day | ORAL | Status: DC
  Administered 2014-12-24 – 2014-12-25 (×2): 40 mg via ORAL
  Filled 2014-12-24 (×2): qty 1

## 2014-12-24 MED ORDER — GABAPENTIN 300 MG PO CAPS
300.0000 mg | ORAL_CAPSULE | Freq: Three times a day (TID) | ORAL | Status: DC
  Administered 2014-12-24 – 2014-12-26 (×5): 300 mg via ORAL
  Filled 2014-12-24 (×5): qty 1

## 2014-12-24 MED ORDER — SODIUM CHLORIDE 0.9 % IV BOLUS (SEPSIS)
1000.0000 mL | Freq: Once | INTRAVENOUS | Status: AC
  Administered 2014-12-24: 1000 mL via INTRAVENOUS

## 2014-12-24 MED ORDER — CYCLOBENZAPRINE HCL 10 MG PO TABS: 10.0000 mg | ORAL_TABLET | Freq: Three times a day (TID) | ORAL | Status: DC | PRN

## 2014-12-24 MED ORDER — BUDESONIDE 0.5 MG/2ML IN SUSP
0.5000 mg | Freq: Two times a day (BID) | RESPIRATORY_TRACT | Status: DC
  Administered 2014-12-24 – 2014-12-26 (×4): 0.5 mg via RESPIRATORY_TRACT
  Filled 2014-12-24 (×4): qty 2

## 2014-12-24 MED ORDER — HYDROCHLOROTHIAZIDE 25 MG PO TABS
25.0000 mg | ORAL_TABLET | Freq: Every day | ORAL | Status: DC
  Administered 2014-12-25 – 2014-12-26 (×2): 25 mg via ORAL
  Filled 2014-12-24 (×3): qty 1

## 2014-12-24 MED ORDER — MEROPENEM-SODIUM CHLORIDE 1 GM/50ML IV SOLR: 1.0000 mg | Freq: Three times a day (TID) | INTRAVENOUS | Status: DC

## 2014-12-24 MED ORDER — WARFARIN SODIUM 1 MG PO TABS
3.0000 mg | ORAL_TABLET | Freq: Every day | ORAL | Status: DC
  Filled 2014-12-24: qty 3

## 2014-12-24 MED ORDER — DEXTROSE-NACL 5-0.9 % IV SOLN
INTRAVENOUS | Status: DC
  Administered 2014-12-24: 22:00:00 via INTRAVENOUS

## 2014-12-24 MED ORDER — VANCOMYCIN HCL 10 G IV SOLR
1500.0000 mg | Freq: Once | INTRAVENOUS | Status: AC
  Administered 2014-12-24: 1500 mg via INTRAVENOUS
  Filled 2014-12-24: qty 1500

## 2014-12-24 MED ORDER — ALBUTEROL SULFATE (2.5 MG/3ML) 0.083% IN NEBU
2.5000 mg | INHALATION_SOLUTION | Freq: Four times a day (QID) | RESPIRATORY_TRACT | Status: DC
  Administered 2014-12-24 – 2014-12-26 (×6): 2.5 mg via RESPIRATORY_TRACT
  Filled 2014-12-24 (×6): qty 3

## 2014-12-24 MED ORDER — SODIUM CHLORIDE 0.9 % IV SOLN
1.0000 g | Freq: Three times a day (TID) | INTRAVENOUS | Status: DC
  Administered 2014-12-24 – 2014-12-26 (×6): 1 g via INTRAVENOUS
  Filled 2014-12-24 (×7): qty 1

## 2014-12-24 MED ORDER — DEXTROSE 50 % IV SOLN
1.0000 | Freq: Once | INTRAVENOUS | Status: AC
Start: 2014-12-24 — End: 2014-12-24
  Administered 2014-12-24: 50 mL via INTRAVENOUS
  Filled 2014-12-24: qty 50

## 2014-12-24 MED ORDER — ALBUTEROL SULFATE HFA 108 (90 BASE) MCG/ACT IN AERS: 2.0000 | INHALATION_SPRAY | Freq: Four times a day (QID) | RESPIRATORY_TRACT | Status: DC | PRN

## 2014-12-24 MED ORDER — IPRATROPIUM BROMIDE 0.02 % IN SOLN
2.5000 mL | Freq: Four times a day (QID) | RESPIRATORY_TRACT | Status: DC
  Administered 2014-12-24 – 2014-12-26 (×6): 0.5 mg via RESPIRATORY_TRACT
  Filled 2014-12-24 (×6): qty 2.5

## 2014-12-24 NOTE — Progress Notes (Signed)
Pt states that she had a severe coughing attack that left her with severe shortness of breath when her nebulizer medications were mixed together while a pt at [facility]. Pt was instructed by medical staff at [facility] to take nebulizer treatments individually and designate a nebulizer for each medication. I have setup 4 separate nebulizers and labeled each one with the designated medication. Pt tolerated treatments well and had no adverse reaction following nebulizer treatments.

## 2014-12-24 NOTE — H&P (Signed)
[place] at [place] NAME: Charlotte Cross    MR#:  [id number]  DATE OF BIRTH:  1965-07-24  DATE OF ADMISSION:  12/24/2014  PRIMARY CARE PHYSICIAN: Richrd Humbles, MD   REQUESTING/REFERRING PHYSICIAN: Mariea Clonts  CHIEF COMPLAINT:   Chief Complaint  Patient presents with  . Hypoglycemia    HISTORY OF PRESENT ILLNESS: Charlotte Cross  is a 49 y.o. female with a known history of diabetes, hypertension, sarcoidosis, aspergillosis of the lung and status post resection because of fungal ball at [facility] one month ago and since then she is on medial pain and IV file a PICC line at home. After surgery she again developed fluid around her lungs so she was admitted second time in [facility] and discharged 5 days ago from there after having a chest tube placement and fluid removal, chest tube was removed at the time of discharge. She lives alone and she does not have much appetite since he has this infection so at home she is not cooking much food and she is eating minimal amount of food. Today morning when she woke up she felt cold and excessive sweating so she called her brother she also check her sugar and it was 22 so they called ambulance EMS gave her some juice and her sugar came up so they had to leave, but again within 2 hours she started feeling same and her sugar was noted to be low again so she was brought to emergency room. In ER her temperature was also noted to be slightly on the lower side and patient was shaking so she was given multiple blankets to cover her while waiting for her heating air blanket to arrive but before that her temperature came back to normal and she did not require to go on a hot air blanket. After giving her some juice to drink in ER within an hour her sugar again went down up to 69 so ER physician decided to call [facility] thinking about some complications from her surgery, but [facility] told that they have many patients  waiting in ER and she might have a weight almost a day before she can get a bed upstairs and patient decided to get admitted over here rather than treating in ER over there.  PAST MEDICAL HISTORY:   Past Medical History  Diagnosis Date  . Diabetes mellitus without complication ([place])   . Hypertension   . Sarcoidosis ([place])   . COPD (chronic obstructive pulmonary disease) ([place])   . Aspergillosis ([place])   . Asthma     PAST SURGICAL HISTORY:  Past Surgical History  Procedure Laterality Date  . Lung removal, partial      left    SOCIAL HISTORY:  Social History  Substance Use Topics  . Smoking status: Never Smoker   . Smokeless tobacco: Not on file  . Alcohol Use: No    FAMILY HISTORY:  Family History  Problem Relation Age of Onset  . Diabetes Mother   . Diabetes Father     DRUG ALLERGIES: No Known Allergies  REVIEW OF SYSTEMS:   CONSTITUTIONAL: No fever, fatigue or weakness. Had cold sweat. EYES: No blurred or double vision.  EARS, NOSE, AND THROAT: No tinnitus or ear pain.  RESPIRATORY: No cough, shortness of breath, wheezing or hemoptysis.  CARDIOVASCULAR: No chest pain, orthopnea, edema.  GASTROINTESTINAL: No nausea, vomiting, diarrhea or abdominal pain.  GENITOURINARY: No dysuria, hematuria.  ENDOCRINE: No polyuria, nocturia,  HEMATOLOGY:  No anemia, easy bruising or bleeding SKIN: No rash or lesion. MUSCULOSKELETAL: No joint pain or arthritis.   NEUROLOGIC: No tingling, numbness, weakness.  PSYCHIATRY: No anxiety or depression.   MEDICATIONS AT HOME:  Prior to Admission medications   Medication Sig Start Date End Date Taking? Authorizing Provider  albuterol (PROVENTIL HFA;VENTOLIN HFA) 108 (90 BASE) MCG/ACT inhaler Inhale 2 puffs into the lungs every 6 (six) hours as needed for wheezing or shortness of breath.   Yes Historical Provider, MD  albuterol (PROVENTIL) (2.5 MG/3ML) 0.083% nebulizer solution Inhale 2.5 mg into the lungs every 6 (six) hours as needed for  wheezing or shortness of breath.    Yes Historical Provider, MD  albuterol (PROVENTIL) (5 MG/ML) 0.5% nebulizer solution Inhale 2.5 mg into the lungs 4 (four) times daily.   Yes Historical Provider, MD  aspirin EC 325 MG tablet Take 325 mg by mouth daily.   Yes Historical Provider, MD  budesonide (PULMICORT) 0.5 MG/2ML nebulizer solution Inhale 0.5 mg into the lungs every 12 (twelve) hours.   Yes Historical Provider, MD  cyclobenzaprine (FLEXERIL) 10 MG tablet Take 10 mg by mouth 3 (three) times daily as needed for muscle spasms.    Yes Historical Provider, MD  docusate sodium (COLACE) 100 MG capsule Take 100 mg by mouth 2 (two) times daily.   Yes Historical Provider, MD  gabapentin (NEURONTIN) 300 MG capsule Take 300 mg by mouth 3 (three) times daily.   Yes Historical Provider, MD  glipiZIDE (GLUCOTROL XL) 5 MG 24 hr tablet Take 5 mg by mouth daily with breakfast.   Yes Historical Provider, MD  hydrochlorothiazide (HYDRODIURIL) 25 MG tablet Take 25 mg by mouth daily.   Yes Historical Provider, MD  ipratropium (ATROVENT) 0.02 % nebulizer solution Inhale 2.5 mLs into the lungs 4 (four) times daily.   Yes Historical Provider, MD  lisinopril (PRINIVIL,ZESTRIL) 5 MG tablet Take 5 mg by mouth daily.   Yes Historical Provider, MD  Meropenem-Sodium Chloride 1 GM/50ML SOLR Inject 1 mg into the vein every 8 (eight) hours. For 11 days 12/19/14 12/30/14 Yes Historical Provider, MD  metFORMIN (GLUCOPHAGE) 1000 MG tablet Take 1,000 mg by mouth 2 (two) times daily with a meal.   Yes Historical Provider, MD  minocycline (MINOCIN,DYNACIN) 100 MG capsule Take 100 mg by mouth 2 (two) times daily. For 14 days 12/10/14 12/24/14 Yes Historical Provider, MD  Oxycodone HCl 10 MG TABS Take 10 mg by mouth every 6 (six) hours as needed (pain).    Yes Historical Provider, MD  pravastatin (PRAVACHOL) 40 MG tablet Take 40 mg by mouth at bedtime.   Yes Historical Provider, MD  Sodium Chloride, Inhalant, 7 % NEBU Inhale 4 mLs into  the lungs 4 (four) times daily.   Yes Historical Provider, MD  voriconazole (VFEND) 50 MG tablet Take 300 mg by mouth every 12 (twelve) hours. For 14 days 12/10/14 12/24/14 Yes Historical Provider, MD  warfarin (COUMADIN) 3 MG tablet Take 3 mg by mouth at bedtime.   Yes Historical Provider, MD      PHYSICAL EXAMINATION:   VITAL SIGNS: Blood pressure 121/86, pulse 73, temperature 97.3 F (36.3 C), temperature source Rectal, resp. rate 25, height _0  (1.803 m), weight 99.791 kg (220 lb), last menstrual period 11/24/2014, SpO2 100 %.  GENERAL:  49 y.o.-year-old patient lying in the bed with no acute distress.  EYES: Pupils equal, round, reactive to light and accommodation. No scleral icterus. Extraocular muscles intact.  HEENT: Head atraumatic, normocephalic. Oropharynx  and nasopharynx clear.  NECK:  Supple, no jugular venous distention. No thyroid enlargement, no tenderness.  LUNGS: Normal breath sounds bilaterally, no wheezing, rales,rhonchi or crepitation. No use of accessory muscles of respiration.  CARDIOVASCULAR: S1, S2 normal. No murmurs, rubs, or gallops.  ABDOMEN: Soft, nontender, nondistended. Bowel sounds present. No organomegaly or mass.  EXTREMITIES: No pedal edema, cyanosis, or clubbing.  NEUROLOGIC: Cranial nerves II through XII are intact. Muscle strength 5/5 in all extremities. Sensation intact. Gait not checked.  PSYCHIATRIC: The patient is alert and oriented x 3.  SKIN: No obvious rash, lesion, or ulcer.   LABORATORY PANEL:   CBC  Recent Labs Lab 12/24/14 1715  WBC 7.1  HGB 9.9*  HCT 29.3*  PLT 407  MCV 92.3  MCH 31.1  MCHC 33.7  RDW 14.9*  LYMPHSABS 0.5*  MONOABS 0.4  EOSABS 0.1  BASOSABS 0.0   ------------------------------------------------------------------------------------------------------------------  Chemistries   Recent Labs Lab 12/24/14 1715  NA 137  K 5.6*  CL 103  CO2 27  GLUCOSE 56*  BUN 26*  CREATININE 0.97  CALCIUM 9.3  AST 23   ALT 14  ALKPHOS 103  BILITOT 0.4   ------------------------------------------------------------------------------------------------------------------ estimated creatinine clearance is 89.3 mL/min (by C-G formula based on Cr of 0.97). ------------------------------------------------------------------------------------------------------------------ No results for input(s): TSH, T4TOTAL, T3FREE, THYROIDAB in the last 72 hours.  Invalid input(s): FREET3   Coagulation profile No results for input(s): INR, PROTIME in the last 168 hours. ------------------------------------------------------------------------------------------------------------------- No results for input(s): DDIMER in the last 72 hours. -------------------------------------------------------------------------------------------------------------------  Cardiac Enzymes  Recent Labs Lab 12/24/14 1715  TROPONINI <0.03   ------------------------------------------------------------------------------------------------------------------ Invalid input(s): POCBNP  ---------------------------------------------------------------------------------------------------------------  Urinalysis    Component Value Date/Time   COLORURINE YELLOW* 12/24/2014 1715   APPEARANCEUR CLEAR* 12/24/2014 1715   LABSPEC 1.013 12/24/2014 1715   PHURINE 5.0 12/24/2014 1715   GLUCOSEU 150* 12/24/2014 1715   HGBUR 1+* 12/24/2014 1715   BILIRUBINUR NEGATIVE 12/24/2014 1715   KETONESUR NEGATIVE 12/24/2014 1715   PROTEINUR NEGATIVE 12/24/2014 1715   NITRITE NEGATIVE 12/24/2014 1715   LEUKOCYTESUR NEGATIVE 12/24/2014 1715     RADIOLOGY: Dg Chest 2 View  12/24/2014   CLINICAL DATA:  Hypoglycemia, recent lung surgery  EXAM: CHEST - 2 VIEW  COMPARISON:  11/14/2014  FINDINGS: Cardiac shadow is stable. Elevation of the left hemidiaphragm is seen consistent with the recent surgical history. The bullous changes in the left apex are less well visualized  on the current study. A right tunneled central venous line is seen in satisfactory position. Chronic changes in the right lung base are noted. No acute abnormality is seen.  IMPRESSION: Postsurgical change on the left with volume loss. No other focal abnormality is noted.   Electronically Signed   By: Inez Catalina M.D.   On: 12/24/2014 18:26    IMPRESSION AND PLAN:  * Hypoglycemia  Patient has not been eating good for last 4 days since she discharged from hospital because of decreased appetite and laziness to [place] some good food. She continued taking glipizide and metformin.  Most likely this is the reason for hypoglycemia.  She does not look having sepsis or any infection, white cell count is normal, no fever or worsening cough or hemodialysis.  Currently her blood sugar is normal but because of repeated episodes of hypoglycemia I will keep on observation and will keep checking her blood sugar every 2 hours if any further problems noted with mitral to start her on D5 normal saline IV drip.  * Hypothermia  This was mild and resolved just by Blanket.  * Aspergillosis    S/p Partial Lung resection    On meropenem- cont same,    She has appointment next week.  * Diabetes  As presented with hypoglycemia I will hold medications.  * Hypertension  Blood pressure stable continue home medication.  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   All the records are reviewed and case discussed with ED provider. Management plans discussed with the patient, family and they are in agreement.  CODE STATUS: Full.   TOTAL TIME TAKING CARE OF THIS PATIENT: 50 minutes.    Vaughan Basta M.D on 12/24/2014   Between 7am to 6pm - Pager - [phone]  After 6pm go to [email or web address] - password [email or web address] [place] [facility]  Office  [phone]  CC: Primary care physician; Richrd Humbles, MD   Note: This dictation was prepared with Dragon  dictation along with smaller phrase technology. Any transcriptional errors that result from this process are unintentional.

## 2014-12-24 NOTE — ED Notes (Signed)
Pt to ED via EMS from home c/o hypoglycemia.  Per EMS pt CBG dropped below 20 twice today.  Pt given 1 amp D50 en route, CBG 174 after amp.  Pt CBG 96 in ED.  Pt recently hospitalized at [facility] after complication with lung surgery.  Pt also stating difficulty urinating, dark urine when she does go.  Pt has hx of COPD, asthma, HTN, and DBM.  Pt is A&Ox4 at this time, speaking in complete and coherent sentences.

## 2014-12-24 NOTE — Progress Notes (Signed)
On monitoing her blood sugar dropped to 40- so start on D5NS drip.

## 2014-12-24 NOTE — ED Provider Notes (Signed)
[facility] Emergency Department Provider Note  ____________________________________________  Time seen: Approximately 4:41 PM  I have reviewed the triage vital signs and the nursing notes.   HISTORY  Chief Complaint Hypoglycemia    HPI Charlotte Cross is a 49 y.o. 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 Patient was discharged from [facility] 5 days ago and has been feeling well until this morning when she developed sweaty feeling. She checked her blood sugar and it was too low to read. She was evaluated by EMS was able to improve her blood sugar with iv dextrose. 3 hours later she called EMS back for another episode of hypoglycemia and was transferred here for further evaluation. EMS states they were unable to obtain a temperature due to being too low to read.Patient does report that for the past 3 weeks she has had to strain in order to urinate, dark and foul-smelling urine. She denies any nausea, vomiting, abdominal pain, change in her baseline shortness of breath or cough, redness or pain around her PICC line site, fevers or chills.   Past Medical History  Diagnosis Date  . Diabetes mellitus without complication ([place])   . Hypertension   . Sarcoidosis ([place])   . COPD (chronic obstructive pulmonary disease) ([place])   . Aspergillosis ([place])   . Asthma     Patient Active Problem List   Diagnosis Date Noted  . Hemoptysis 11/15/2014    Past Surgical History  Procedure Laterality Date  . Lung removal, partial      left    Current Outpatient Rx  Name  Route  Sig  Dispense  Refill  . gabapentin (NEURONTIN) 300 MG capsule   Oral   Take 300 mg by mouth 3 (three) times daily.         Marland Kitchen glipiZIDE (GLUCOTROL XL) 5 MG 24 hr tablet   Oral   Take 5 mg by mouth  daily with breakfast.         . hydrochlorothiazide (HYDRODIURIL) 25 MG tablet   Oral   Take 25 mg by mouth daily.         Marland Kitchen ipratropium (ATROVENT HFA) 17 MCG/ACT inhaler   Inhalation   Inhale 2 puffs into the lungs every 6 (six) hours.         Marland Kitchen lisinopril (PRINIVIL,ZESTRIL) 5 MG tablet   Oral   Take 5 mg by mouth daily.         . metFORMIN (GLUCOPHAGE) 1000 MG tablet   Oral   Take 1,000 mg by mouth 2 (two) times daily with a meal.         . simvastatin (ZOCOR) 5 MG tablet   Oral   Take 5 mg by mouth daily.           Allergies Review of patient's allergies indicates no known allergies.  History reviewed. No pertinent family history.  Social History Social History  Substance Use Topics  . Smoking status: Never Smoker   . Smokeless tobacco: None  . Alcohol Use: No    Review of Systems Constitutional: No fever/chills. Plus sweaty feeling. No syncope. Eyes: No visual changes. ENT: No sore throat. No pain with swallowing. Cardiovascular: Denies chest pain, palpitations. No pain or erythema around the PICC line site. Respiratory: Baseline shortness of breath which is grossly unchanged. No cough. Gastrointestinal: No  abdominal pain.  No nausea, no vomiting.  No diarrhea.  No constipation. Genitourinary: Difficulty with urination. No pain or burning with urination. Musculoskeletal: Negative for back pain. Skin: Negative for rash. Neurological: Negative for headaches, focal weakness or numbness.  10-point ROS otherwise negative.  ____________________________________________   PHYSICAL EXAM:  VITAL SIGNS: ED Triage Vitals  Enc Vitals Group     BP 12/24/14 1638 121/86 mmHg     Pulse Rate 12/24/14 1638 73     Resp 12/24/14 1638 20     Temp --      Temp src --      SpO2 12/24/14 1638 100 %     Weight 12/24/14 1638 220 lb (99.791 kg)     Height 12/24/14 1638 _0  (1.803 m)     Head Cir --      Peak Flow --      Pain Score --      Pain Loc --       Pain Edu? --      Excl. in [place]? --     Constitutional: Alert and oriented. Speaks clearly, in full sentences, answering questions appropriately. Eyes: Conjunctivae are normal.  EOMI. Head: Atraumatic. Nose: No congestion/rhinnorhea. Mouth/Throat: Mucous membranes are mildly dry. Neck: No stridor.  Supple. Full range of motion without pain. No meningismus.  Cardiovascular: Normal rate, regular rhythm. No murmurs, rubs or gallops. PICC line in the right upper chest that is nontender, without surrounding erythema or swelling. Respiratory: Normal respiratory effort.  No retractions. Lungs CTAB.  Minimal Rales in the right lower lung. Gastrointestinal: Obese. Soft and nontender. No distention. No peritoneal signs. Musculoskeletal: No LE edema.  Neurologic:  Normal speech and language. No gross focal neurologic deficits are appreciated. Moves all extremities well. Face and smile are symmetric. Skin:  Skin is cool, and intact. No rash noted. Psychiatric: Mood and affect are normal. Speech and behavior are normal.  Normal judgement.  ____________________________________________   LABS (all labs ordered are listed, but only abnormal results are displayed)  Labs Reviewed  CBC WITH DIFFERENTIAL/PLATELET - Abnormal; Notable for the following:    RBC 3.17 (*)    Hemoglobin 9.9 (*)    HCT 29.3 (*)    RDW 14.9 (*)    Lymphs Abs 0.5 (*)    All other components within normal limits  BLOOD GAS, VENOUS - Abnormal; Notable for the following:    pH, Ven 7.26 (*)    pCO2, Ven 65 (*)    pO2, Ven 28.0 (*)    Bicarbonate 29.2 (*)    All other components within normal limits  URINALYSIS COMPLETEWITH MICROSCOPIC ([facility] ONLY) - Abnormal; Notable for the following:    Color, Urine YELLOW (*)    APPearance CLEAR (*)    Glucose, UA 150 (*)    Hgb urine dipstick 1+ (*)    Squamous Epithelial / LPF 0-5 (*)    All other components within normal limits  URINE CULTURE  CULTURE, BLOOD (ROUTINE X 2)   CULTURE, BLOOD (ROUTINE X 2)  LACTIC ACID, PLASMA  GLUCOSE, CAPILLARY  GLUCOSE, CAPILLARY  COMPREHENSIVE METABOLIC PANEL  LACTIC ACID, PLASMA  TROPONIN I   ____________________________________________  EKG  ED ECG REPORT I, Eula Listen, the attending physician, personally viewed and interpreted this ECG.   Date: 12/24/2014  EKG Time: 17.11   Rate: 76  Rhythm: normal sinus rhythm  Axis: leftward  Intervals:first-degree A-V block ; incomplete RBBB  ST&T Change: Nonspecific T-wave inversions in V1.  ____________________________________________  RADIOLOGY  Dg Chest 2 View  12/24/2014   CLINICAL DATA:  Hypoglycemia, recent lung surgery  EXAM: CHEST - 2 VIEW  COMPARISON:  11/14/2014  FINDINGS: Cardiac shadow is stable. Elevation of the left hemidiaphragm is seen consistent with the recent surgical history. The bullous changes in the left apex are less well visualized on the current study. A right tunneled central venous line is seen in satisfactory position. Chronic changes in the right lung base are noted. No acute abnormality is seen.  IMPRESSION: Postsurgical change on the left with volume loss. No other focal abnormality is noted.   Electronically Signed   By: Inez Catalina M.D.   On: 12/24/2014 18:26    ____________________________________________   PROCEDURES  Procedure(s) performed: None  Critical Care performed: Yes, see crit care notes  CRITICAL CARE Performed by: Eula Listen   Total critical care time: 35  Critical care time was exclusive of separately billable procedures and treating other patients.  Critical care was necessary to treat or prevent imminent or life-threatening deterioration.  Critical care was time spent personally by me on the following activities: development of treatment plan with patient and/or surrogate as well as nursing, discussions with consultants, evaluation of patient's response to treatment, examination of  patient, obtaining history from patient or surrogate, ordering and performing treatments and interventions, ordering and review of laboratory studies, ordering and review of radiographic studies, pulse oximetry and re-evaluation of patient's condition.  ____________________________________________   INITIAL IMPRESSION / ASSESSMENT AND PLAN / ED COURSE  Pertinent labs & imaging results that were available during my care of the patient were reviewed by me and considered in my medical decision making (see chart for details).  49 y.o. female with a history of sarcoidosis, pulmonary aspergillosis status post resection complicated by bacterial infection on meropenem with panic, presenting for hypoglycemia and hypothermia. At this time, the patient is mentating well but her low temperatures and blood sugars are concerning for sepsis. She did have her meropenem this morning, and I will add vancomycin and Zosyn. I am concerned for bacteremia, line infection, urosepsis, or worsening pulmonary infection. I will initiate her workup here, and plan transfer to [facility] for further treatment and evaluation given the patient's preference.  ----------------------------------------- 5:41 PM on 12/24/2014 -----------------------------------------  The patient continues to [facility] well and was able to tolerate by mouth without vomiting. She is hypothermic with a temperature of 95.5 and a bear hugger has been applied. Awaiting lab results. VBG does show mild acidemia with hypercarbia and hypoxia. At this time, she is stable on her home 2 L nasal cannula.  ----------------------------------------- 6:38 PM on 12/24/2014 -----------------------------------------  The patient continues to do well. Her temperature has normalized with warm blankets. She has been given clindamycin and Zosyn to cover empirically for infection. I called [facility] for possible transfer but at this time the hospitalist full. The patient has been  admitted to the hospitalist. ____________________________________________  FINAL CLINICAL IMPRESSION(S) / ED DIAGNOSES  Final diagnoses:  Sepsis, due to unspecified organism ([employer])  Hypoglycemia  Hypercarbia      NEW MEDICATIONS STARTED DURING THIS VISIT:  New Prescriptions   No medications on file     Eula Listen, MD 12/24/14 1839

## 2014-12-24 NOTE — ED Notes (Signed)
Patient transported to X-ray 

## 2014-12-25 DIAGNOSIS — E11649 Type 2 diabetes mellitus with hypoglycemia without coma: Secondary | ICD-10-CM | POA: Diagnosis not present

## 2014-12-25 LAB — GLUCOSE, CAPILLARY
GLUCOSE-CAPILLARY: 101 mg/dL — AB (ref 65–99)
GLUCOSE-CAPILLARY: 83 mg/dL (ref 65–99)
GLUCOSE-CAPILLARY: 71 mg/dL (ref 65–99)
GLUCOSE-CAPILLARY: 81 mg/dL (ref 65–99)
GLUCOSE-CAPILLARY: 194 mg/dL — AB (ref 65–99)
Glucose-Capillary: 111 mg/dL — ABNORMAL HIGH (ref 65–99)
Glucose-Capillary: 157 mg/dL — ABNORMAL HIGH (ref 65–99)
Glucose-Capillary: 174 mg/dL — ABNORMAL HIGH (ref 65–99)
Glucose-Capillary: 24 mg/dL — CL (ref 65–99)
Glucose-Capillary: 66 mg/dL (ref 65–99)
Glucose-Capillary: 67 mg/dL (ref 65–99)
Glucose-Capillary: 75 mg/dL (ref 65–99)

## 2014-12-25 LAB — CBC
HEMATOCRIT: 25.3 % — AB (ref 35.0–47.0)
Hemoglobin: 8.9 g/dL — ABNORMAL LOW (ref 12.0–16.0)
MCH: 32.5 pg (ref 26.0–34.0)
MCHC: 35.2 g/dL (ref 32.0–36.0)
MCV: 92.6 fL (ref 80.0–100.0)
Platelets: 387 10*3/uL (ref 150–440)
RBC: 2.74 MIL/uL — AB (ref 3.80–5.20)
RDW: 14.6 % — ABNORMAL HIGH (ref 11.5–14.5)
WBC: 8 10*3/uL (ref 3.6–11.0)

## 2014-12-25 LAB — BASIC METABOLIC PANEL
ANION GAP: 3 — AB (ref 5–15)
BUN: 19 mg/dL (ref 6–20)
CHLORIDE: 110 mmol/L (ref 101–111)
CO2: 26 mmol/L (ref 22–32)
Calcium: 9 mg/dL (ref 8.9–10.3)
Creatinine, Ser: 0.8 mg/dL (ref 0.44–1.00)
GFR calc non Af Amer: >60 mL/min (ref >60)
Glucose, Bld: 83 mg/dL (ref 65–99)
POTASSIUM: 6.1 mmol/L — AB (ref 3.5–5.1)
SODIUM: 139 mmol/L (ref 135–145)

## 2014-12-25 MED ORDER — DEXTROSE 10 % IV SOLN
INTRAVENOUS | Status: DC
Start: 2014-12-25 — End: 2014-12-26
  Administered 2014-12-25 – 2014-12-26 (×2): via INTRAVENOUS

## 2014-12-25 MED ORDER — DEXTROSE 50 % IV SOLN
25.0000 mL | Freq: Once | INTRAVENOUS | Status: AC
  Administered 2014-12-25: 25 mL via INTRAVENOUS
  Filled 2014-12-25: qty 50

## 2014-12-25 MED ORDER — SODIUM POLYSTYRENE SULFONATE 15 GM/60ML PO SUSP
30.0000 g | Freq: Once | ORAL | Status: DC
  Filled 2014-12-25: qty 120

## 2014-12-25 NOTE — Progress Notes (Signed)
Called Dr. Lavetta Nielsen regarding patient's low blood sugar- 24.  Doctor ordered 19m of D50 and changed fluids.  CChristene Slates 12/25/2014  12:54 AM

## 2014-12-25 NOTE — Progress Notes (Signed)
Inpatient Diabetes Program Recommendations  AACE/ADA: New Consensus Statement on Inpatient Glycemic Control (2015)  Target Ranges:  Prepandial:   less than 140 mg/dL      Peak postprandial:   less than 180 mg/dL (1-2 hours)      Critically ill patients:  140 - 180 mg/dL   Review of Glycemic Control  Results for Charlotte Cross, Charlotte Cross (MRN [id number]) as of 12/25/2014 11:22  Ref. Range 12/25/2014 04:03 12/25/2014 05:32 12/25/2014 06:58 12/25/2014 08:57 12/25/2014 11:10  Glucose-Capillary Latest Ref Range: 65-99 mg/dL 71 66 67 75 81    Diabetes history: Type 2 Outpatient Diabetes medications: Metformin 109m bid, Glipizide 581mwith breakfast Current orders for Inpatient glycemic control: none  Inpatient Diabetes Program Recommendations: Spoke with patient at the bedside- has taken Glipizide for many years without ever having hypoglycemia. Took last dose of Glypizide and Metformin yesterday am but admits to having a very poor appetite.   She had been off all oral diabetes medications while she was in the hospital but was told to restart them once she was discharged.   Last A1C on 10/28/14 was 6.6%.  Suggest she be discharged home on Metformin only. Follow up with family doctor.  Continue to check blood sugars at home.   JuGentry FitzRN, BA, MHA, CDE Diabetes Coordinator Inpatient Diabetes Program  [phone] Pager) [phone]/09/2014 11:29 AM

## 2014-12-25 NOTE — Progress Notes (Signed)
Called Dr. Lavetta Nielsen about patient's low blood sugar-38.  Doctor ordered ampule of D50.  [name]  12/25/2014  12:51 AM

## 2014-12-25 NOTE — Progress Notes (Signed)
Pt FSBS increasing and pt requesting less frequent CBG checks. MD called and orders to decrease D10 IVF to 50cc an hour and q4 FSBS checks. Will continue to assess.

## 2014-12-25 NOTE — Progress Notes (Signed)
Called Dr. Marcille Blanco regarding patient's decreasing blood sugars- 101, 83,71, now 66.  Doctor increased 10% dextrose to 23m/hr.  I have given patient applesauce, juice with sugar dissolved into it, peanut butter with graham crackers, and she is now drinking coca cola.  CChristene Slates  12/25/2014   5:44 AM

## 2014-12-25 NOTE — Progress Notes (Signed)
[place] [facility] [place] NAME: Charlotte Cross    MR#:  [id number]  DATE OF BIRTH:  [birth year]  SUBJECTIVE:  CHIEF COMPLAINT:   Chief Complaint  Patient presents with  . Hypoglycemia   sugar had been running low in 30s to 50s overnight, and IV fluids changed to dextrose 10% chance helped blood sugar running anywhere from 60s to 80s.  Had high potassium up to 6.1 and received Kayexalate overnight. REVIEW OF SYSTEMS:  Review of Systems  Constitutional: Negative for fever, weight loss, malaise/fatigue and diaphoresis.  HENT: Negative for ear discharge, ear pain, hearing loss, nosebleeds, sore throat and tinnitus.   Eyes: Negative for blurred vision and pain.  Respiratory: Negative for cough, hemoptysis, shortness of breath and wheezing.   Cardiovascular: Negative for chest pain, palpitations, orthopnea and leg swelling.  Gastrointestinal: Negative for heartburn, nausea, vomiting, abdominal pain, diarrhea, constipation and blood in stool.  Genitourinary: Negative for dysuria, urgency and frequency.  Musculoskeletal: Negative for myalgias and back pain.  Skin: Negative for itching and rash.  Neurological: Negative for dizziness, tingling, tremors, focal weakness, seizures, weakness and headaches.  Psychiatric/Behavioral: Negative for depression. The patient is not nervous/anxious.    DRUG ALLERGIES:  No Known Allergies VITALS:  Blood pressure 125/70, pulse 85, temperature 98.2 F (36.8 C), temperature source Oral, resp. rate 16, height _0  (1.803 m), weight 103.692 kg (228 lb 9.6 oz), last menstrual period 11/24/2014, SpO2 100 %. PHYSICAL EXAMINATION:  Physical Exam  Constitutional: She is oriented to person, place, and time and well-developed, well-nourished, and in no distress.  HENT:  Head: Normocephalic and atraumatic.  Eyes: Conjunctivae and EOM are normal. Pupils are equal, round, and reactive to light.  Neck: Normal range of  motion. Neck supple. No tracheal deviation present. No thyromegaly present.  Cardiovascular: Normal rate, regular rhythm and normal heart sounds.   Pulmonary/Chest: Effort normal and breath sounds normal. No respiratory distress. She has no wheezes. She exhibits no tenderness.  Abdominal: Soft. Bowel sounds are normal. She exhibits no distension. There is no tenderness.  Musculoskeletal: Normal range of motion.  Neurological: She is alert and oriented to person, place, and time. No cranial nerve deficit.  Skin: Skin is warm and dry. No rash noted.  Psychiatric: Mood and affect normal.   LABORATORY PANEL:   CBC  Recent Labs Lab 12/25/14 0444  WBC 8.0  HGB 8.9*  HCT 25.3*  PLT 387   ------------------------------------------------------------------------------------------------------------------ Chemistries   Recent Labs Lab 12/24/14 1715 12/25/14 0444  NA 137 139  K 5.6* 6.1*  CL 103 110  CO2 27 26  GLUCOSE 56* 83  BUN 26* 19  CREATININE 0.97 0.80  CALCIUM 9.3 9.0  AST 23  --   ALT 14  --   ALKPHOS 103  --   BILITOT 0.4  --    RADIOLOGY:  Dg Chest 2 View  12/24/2014   CLINICAL DATA:  Hypoglycemia, recent lung surgery  EXAM: CHEST - 2 VIEW  COMPARISON:  11/14/2014  FINDINGS: Cardiac shadow is stable. Elevation of the left hemidiaphragm is seen consistent with the recent surgical history. The bullous changes in the left apex are less well visualized on the current study. A right tunneled central venous line is seen in satisfactory position. Chronic changes in the right lung base are noted. No acute abnormality is seen.  IMPRESSION: Postsurgical change on the left with volume loss. No other focal abnormality is noted.   Electronically Signed  By: Inez Catalina M.D.   On: 12/24/2014 18:26   ASSESSMENT AND PLAN:  * Hypoglycemia likely due to decreased appetite and poor PO intake She does not look having sepsis or any infection, white cell count is normal, no fever or  worsening cough or hemodialysis. Sugars been running anywhere between 60-80s now while on D10 @ 75 ml/hr  * Hypothermia  This was mild and resolved just by Blanket.  * Aspergillosis  S/p Partial Lung resection  On meropenem, voriconazole - cont same,  She has appointment next week.  * Hyperkalemia: Of unknown etiology, given 1 dose of Kayexalate and recheck potassium now.  * Hypertension Blood pressure stable continue home medication.  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     All the records are reviewed and case discussed with Care Management/Social Worker. Management plans discussed with the patient, family and they are in agreement.  CODE STATUS: Full code  TOTAL TIME TAKING CARE OF THIS PATIENT: 35 minutes.   More than 50% of the time was spent in counseling/coordination of care: YES  POSSIBLE D/C IN 1-2 DAYS, DEPENDING ON CLINICAL CONDITION.   [facility], Casson Catena M.D on 12/25/2014 at 1:44 PM  Between 7am to 6pm - Pager - [phone]  After 6pm go to [email or web address] - password [email or web address] [place] [facility]  Office  [phone]  CC:  Primary care physician; Richrd Humbles, MD

## 2014-12-25 NOTE — Progress Notes (Signed)
Called Dr. Marcille Blanco in regards to patient's potassium level- 6.1.  Doctor ordered 30g of kayexalate po. [name] 12/25/2014  6:34 AM

## 2014-12-26 DIAGNOSIS — E11649 Type 2 diabetes mellitus with hypoglycemia without coma: Secondary | ICD-10-CM | POA: Diagnosis not present

## 2014-12-26 LAB — URINE CULTURE

## 2014-12-26 LAB — GLUCOSE, CAPILLARY
GLUCOSE-CAPILLARY: 139 mg/dL — AB (ref 65–99)
GLUCOSE-CAPILLARY: 154 mg/dL — AB (ref 65–99)
GLUCOSE-CAPILLARY: 164 mg/dL — AB (ref 65–99)
Glucose-Capillary: 126 mg/dL — ABNORMAL HIGH (ref 65–99)

## 2014-12-26 MED ORDER — GLIPIZIDE ER 2.5 MG PO TB24: 2.5000 mg | ORAL_TABLET | Freq: Every day | ORAL | Status: DC

## 2014-12-26 NOTE — Progress Notes (Signed)
Pt alert and oriented. Discharge summary given to patient. IV site removed. PICC line remained in place per MD order. Concerns addressed.

## 2014-12-26 NOTE — Discharge Instructions (Addendum)
DIET:  Diabetic diet  DISCHARGE CONDITION:  Stable  ACTIVITY:  Activity as tolerated  DISCHARGE LOCATION:  home   If you experience worsening of your admission symptoms, develop shortness of breath, life threatening emergency, suicidal or homicidal thoughts you must seek medical attention immediately by calling 911 or calling your MD immediately  if symptoms less severe.  You Must read complete instructions/literature along with all the possible adverse reactions/side effects for all the Medicines you take and that have been prescribed to you. Take any new Medicines after you have completely understood and accpet all the possible adverse reactions/side effects.   Please note  You were cared for by a hospitalist during your hospital stay. If you have any questions about your discharge medications or the care you received while you were in the hospital after you are discharged, you can call the unit and asked to speak with the hospitalist on call if the hospitalist that took care of you is not available. Once you are discharged, your primary care physician will handle any further medical issues. Please note that NO REFILLS for any discharge medications will be authorized once you are discharged, as it is imperative that you return to your primary care physician (or establish a relationship with a primary care physician if you do not have one) for your aftercare needs so that they can reassess your need for medications and monitor your lab values.  Instructions below as discussed  Continue IV abx as before.  Eat low carb, small portion 4 meals a day.  Continue to check blood sugars as before.  I have decreased dose of your glipizide. Restart this medication only if you see your blood sugars are consistently over 150.  Keep log of blood sugars and take to your doctor's appt.  Hypoglycemia Low blood sugar (hypoglycemia) means that the level of sugar in your blood is lower than it should  be. Signs of low blood sugar include:  Getting sweaty.  Feeling hungry.  Feeling dizzy or weak.  Feeling sleepier than normal.  Feeling nervous.  Headaches.  Having a fast heartbeat. Low blood sugar can happen fast and can be an emergency. Your doctor can do tests to check your blood sugar level. You can have low blood sugar and not have diabetes. HOME CARE  Check your blood sugar as told by your doctor. If it is less than 70 mg/dl or as told by your doctor, take 1 of the following:  3 to 4 glucose tablets.   cup clear juice.   cup soda pop, not diet.  1 cup milk.  5 to 6 hard candies.  Recheck blood sugar after 15 minutes. Repeat until it is at the right level.  Eat a snack if it is more than 1 hour until the next meal.  Only take medicine as told by your doctor.  Do not skip meals. Eat on time.  Do not drink alcohol except with meals.  Check your blood glucose before driving.  Check your blood glucose before and after exercise.  Always carry treatment with you, such as glucose pills.  Always wear a medical alert bracelet if you have diabetes. GET HELP RIGHT AWAY IF:   Your blood glucose goes below 70 mg/dl or as told by your doctor, and you:  Are confused.  Are not able to swallow.  Pass out (faint).  You cannot treat yourself. You may need someone to help you.  You have low blood sugar problems often.  You  have problems from your medicines.  You are not feeling better after 3 to 4 days.  You have vision changes. MAKE SURE YOU:   Understand these instructions.  Will watch this condition.  Will get help right away if you are not doing well or get worse.   This information is not intended to replace advice given to you by your health care provider. Make sure you discuss any questions you have with your health care provider.   Document Released: 05/31/2009 Document Revised: 03/27/2014 Document Reviewed: 11/10/2014 Elsevier Interactive  Patient Education [employer].

## 2014-12-28 NOTE — Discharge Summary (Signed)
[facility] [place] NAME: Charlotte Cross    MR#:  [id number]  DATE OF BIRTH:  Jan 17, 1966  DATE OF ADMISSION:  12/24/2014 ADMITTING PHYSICIAN: Vaughan Basta, MD  DATE OF DISCHARGE: 12/26/2014  2:18 PM  PRIMARY CARE PHYSICIAN: Richrd Humbles, MD    ADMISSION DIAGNOSIS:  Hypercarbia [R06.89] Hypoglycemia [E16.2] Sepsis, due to unspecified organism ([place]) [A41.9]  DISCHARGE DIAGNOSIS:  Principal Problem:   Hypoglycemia Active Problems:   Hyperkalemia   SECONDARY DIAGNOSIS:   Past Medical History  Diagnosis Date  . Diabetes mellitus without complication ([place])   . Hypertension   . Sarcoidosis ([place])   . COPD (chronic obstructive pulmonary disease) ([place])   . Aspergillosis ([place])   . Asthma      ADMITTING HISTORY  HISTORY OF PRESENT ILLNESS: Charlotte Cross is a 49 y.o. female with a known history of diabetes, hypertension, sarcoidosis, aspergillosis of the lung and status post resection because of fungal ball at [facility] one month ago and since then she is on medial pain and IV file a PICC line at home. After surgery she again developed fluid around her lungs so she was admitted second [facility] in [facility] and discharged 5 days ago from there after having a chest tube placement and fluid removal, chest tube was removed at the time of discharge. She lives alone and she does not have much appetite since he has this infection so at home she is not cooking much food and she is eating minimal amount of food. Today morning when she woke up she felt cold and excessive sweating so she called her brother she also check her sugar and it was 22 so they called ambulance EMS gave her some juice and her sugar came up so they had to leave, but again within 2 hours she started feeling same and her sugar was noted to be low again so she was brought to emergency room. In ER her temperature was also noted to be slightly on the lower side and  patient was shaking so she was given multiple blankets to cover her while waiting for her heating air blanket to arrive but before that her temperature came back to normal and she did not require to go on a hot air blanket. After giving her some juice to drink in ER within an hour her sugar again went down up to 69 so ER physician decided to call [facility] thinking about some complications from her surgery, but [facility] told that they have many patients waiting in ER and she might have a weight almost a day before she can get a bed upstairs and patient decided to get admitted over here rather than treating in ER over there.   HOSPITAL COURSE:   * Hypoglycemia likely due to decreased appetite and poor PO intake Was on D10 which was stopped. Glipizide dose halved at discharge and instructions given to restart it only blood sugars were found to be more than 150. Patient was counseled regarding diabetic diet and she has had poor oral intake due to loss of appetite from recent infection.  * Hypothermia  This was mild and resolved just by Blanket.  * Aspergillosis  S/p Partial Lung resection  On meropenem, voriconazole - cont same,  She has appointment next week.  * Hyperkalemia: Of unknown etiology, given 1 dose of Kayexalate and recheck potassium now.  * Hypertension Blood pressure stable continue home medication.  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  Stable for discharge home  CONSULTS OBTAINED:     DRUG ALLERGIES:  No Known Allergies  DISCHARGE MEDICATIONS:   Discharge Medication List as of 12/26/2014 11:55 AM    CONTINUE these medications which have CHANGED   Details  glipiZIDE (GLIPIZIDE XL) 2.5 MG 24 hr tablet Take 1 tablet (2.5 mg total) by mouth daily with breakfast., Starting 12/26/2014, Until Discontinued, Print      CONTINUE these medications which have NOT CHANGED   Details  albuterol (PROVENTIL HFA;VENTOLIN HFA) 108  (90 BASE) MCG/ACT inhaler Inhale 2 puffs into the lungs every 6 (six) hours as needed for wheezing or shortness of breath., Until Discontinued, Historical Med    albuterol (PROVENTIL) (2.5 MG/3ML) 0.083% nebulizer solution Inhale 2.5 mg into the lungs every 6 (six) hours as needed for wheezing or shortness of breath. , Until Discontinued, Historical Med    albuterol (PROVENTIL) (5 MG/ML) 0.5% nebulizer solution Inhale 2.5 mg into the lungs 4 (four) times daily., Until Discontinued, Historical Med    aspirin EC 325 MG tablet Take 325 mg by mouth daily., Until Discontinued, Historical Med    budesonide (PULMICORT) 0.5 MG/2ML nebulizer solution Inhale 0.5 mg into the lungs every 12 (twelve) hours., Until Discontinued, Historical Med    cyclobenzaprine (FLEXERIL) 10 MG tablet Take 10 mg by mouth 3 (three) times daily as needed for muscle spasms. , Until Discontinued, Historical Med    docusate sodium (COLACE) 100 MG capsule Take 100 mg by mouth 2 (two) times daily., Until Discontinued, Historical Med    gabapentin (NEURONTIN) 300 MG capsule Take 300 mg by mouth 3 (three) times daily., Until Discontinued, Historical Med    hydrochlorothiazide (HYDRODIURIL) 25 MG tablet Take 25 mg by mouth daily., Until Discontinued, Historical Med    ipratropium (ATROVENT) 0.02 % nebulizer solution Inhale 2.5 mLs into the lungs 4 (four) times daily., Until Discontinued, Historical Med    lisinopril (PRINIVIL,ZESTRIL) 5 MG tablet Take 5 mg by mouth daily., Until Discontinued, Historical Med    Meropenem-Sodium Chloride 1 GM/50ML SOLR Inject 1 mg into the vein every 8 (eight) hours. For 11 days, Starting 12/19/2014, Until Wed 12/30/14, Historical Med    metFORMIN (GLUCOPHAGE) 1000 MG tablet Take 1,000 mg by mouth 2 (two) times daily with a meal., Until Discontinued, Historical Med    Oxycodone HCl 10 MG TABS Take 10 mg by mouth every 6 (six) hours as needed (pain). , Until Discontinued, Historical Med     pravastatin (PRAVACHOL) 40 MG tablet Take 40 mg by mouth at bedtime., Until Discontinued, Historical Med    Sodium Chloride, Inhalant, 7 % NEBU Inhale 4 mLs into the lungs 4 (four) times daily., Until Discontinued, Historical Med    warfarin (COUMADIN) 3 MG tablet Take 3 mg by mouth at bedtime., Until Discontinued, Historical Med      STOP taking these medications     minocycline (MINOCIN,DYNACIN) 100 MG capsule      voriconazole (VFEND) 50 MG tablet          Today    VITAL SIGNS:  Blood pressure 121/72, pulse 72, temperature 97.8 F (36.6 C), temperature source Oral, resp. rate 18, height _0  (1.803 m), weight 103.692 kg (228 lb 9.6 oz), last menstrual period 11/24/2014, SpO2 99 %.  I/O:  No intake or output data in the 24 hours ending 12/28/14 1401  PHYSICAL EXAMINATION:  Physical Exam  GENERAL:  49 y.o.-year-old patient lying in the bed with no acute  distress.  LUNGS: Normal breath sounds bilaterally, no wheezing, rales,rhonchi or crepitation. No use of accessory muscles of respiration.  CARDIOVASCULAR: S1, S2 normal. No murmurs, rubs, or gallops.  ABDOMEN: Soft, non-tender, non-distended. Bowel sounds present. No organomegaly or mass.  NEUROLOGIC: Moves all 4 extremities. PSYCHIATRIC: The patient is alert and oriented x 3.  SKIN: No obvious rash, lesion, or ulcer.   DATA REVIEW:   CBC  Recent Labs Lab 12/25/14 0444  WBC 8.0  HGB 8.9*  HCT 25.3*  PLT 387    Chemistries   Recent Labs Lab 12/24/14 1715 12/25/14 0444  NA 137 139  K 5.6* 6.1*  CL 103 110  CO2 27 26  GLUCOSE 56* 83  BUN 26* 19  CREATININE 0.97 0.80  CALCIUM 9.3 9.0  AST 23  --   ALT 14  --   ALKPHOS 103  --   BILITOT 0.4  --     Cardiac Enzymes  Recent Labs Lab 12/24/14 1715  TROPONINI <0.03    Microbiology Results  Results for orders placed or performed during the hospital encounter of 12/24/14  Urine culture     Status: None   Collection Time: 12/24/14  5:15 PM   Result Value Ref Range Status   Specimen Description URINE, CATHETERIZED  Final   Special Requests Immunocompromised  Final   Culture INSIGNIFICANT GROWTH  Final   Report Status 12/26/2014 FINAL  Final  Blood culture (routine x 2)     Status: None (Preliminary result)   Collection Time: 12/24/14  5:15 PM  Result Value Ref Range Status   Specimen Description BLOOD LEFT AC  Final   Special Requests   Final    BOTTLES DRAWN AEROBIC AND ANAEROBIC  AER 5CC ANA 3CC   Culture NO GROWTH 4 DAYS  Final   Report Status PENDING  Incomplete  Blood culture (routine x 2)     Status: None (Preliminary result)   Collection Time: 12/24/14  6:13 PM  Result Value Ref Range Status   Specimen Description BLOOD LEFT FOREARM  Final   Special Requests   Final    BOTTLES DRAWN AEROBIC AND ANAEROBIC 1CC AEROBIC,1CC ANAEROBIC   Culture NO GROWTH 4 DAYS  Final   Report Status PENDING  Incomplete    RADIOLOGY:  No results found.    Follow up with PCP in 1 week.  Management plans discussed with the patient, family and they are in agreement.  CODE STATUS:   TOTAL TIME TAKING CARE OF THIS PATIENT ON DAY OF DISCHARGE: more than 30 minutes.    Hillary Bow R M.D on 12/28/2014 at 2:01 PM  Between 7am to 6pm - Pager - [phone]  After 6pm go to [email or web address] - password [email or web address] [facility]  Office  [phone]  CC: Primary care physician; Richrd Humbles, MD     Note: This dictation was prepared with Dragon dictation along with smaller phrase technology. Any transcriptional errors that result from this process are unintentional.

## 2014-12-30 LAB — CULTURE, BLOOD (ROUTINE X 2)
Culture: NO GROWTH
Culture: NO GROWTH

## 2015-02-02 ENCOUNTER — Ambulatory Visit: Payer: Medicare Other

## 2015-03-23 ENCOUNTER — Ambulatory Visit: Payer: Medicare Other

## 2015-05-04 ENCOUNTER — Ambulatory Visit: Payer: Medicare Other

## 2016-02-19 IMAGING — CR DG CHEST 2V
2 series · 2 of 2 positions shown · non-contrast
Comparison: 11/14/2014

CLINICAL DATA: Hypoglycemia, recent lung surgery

EXAM:
CHEST - 2 VIEW

[chest pa]
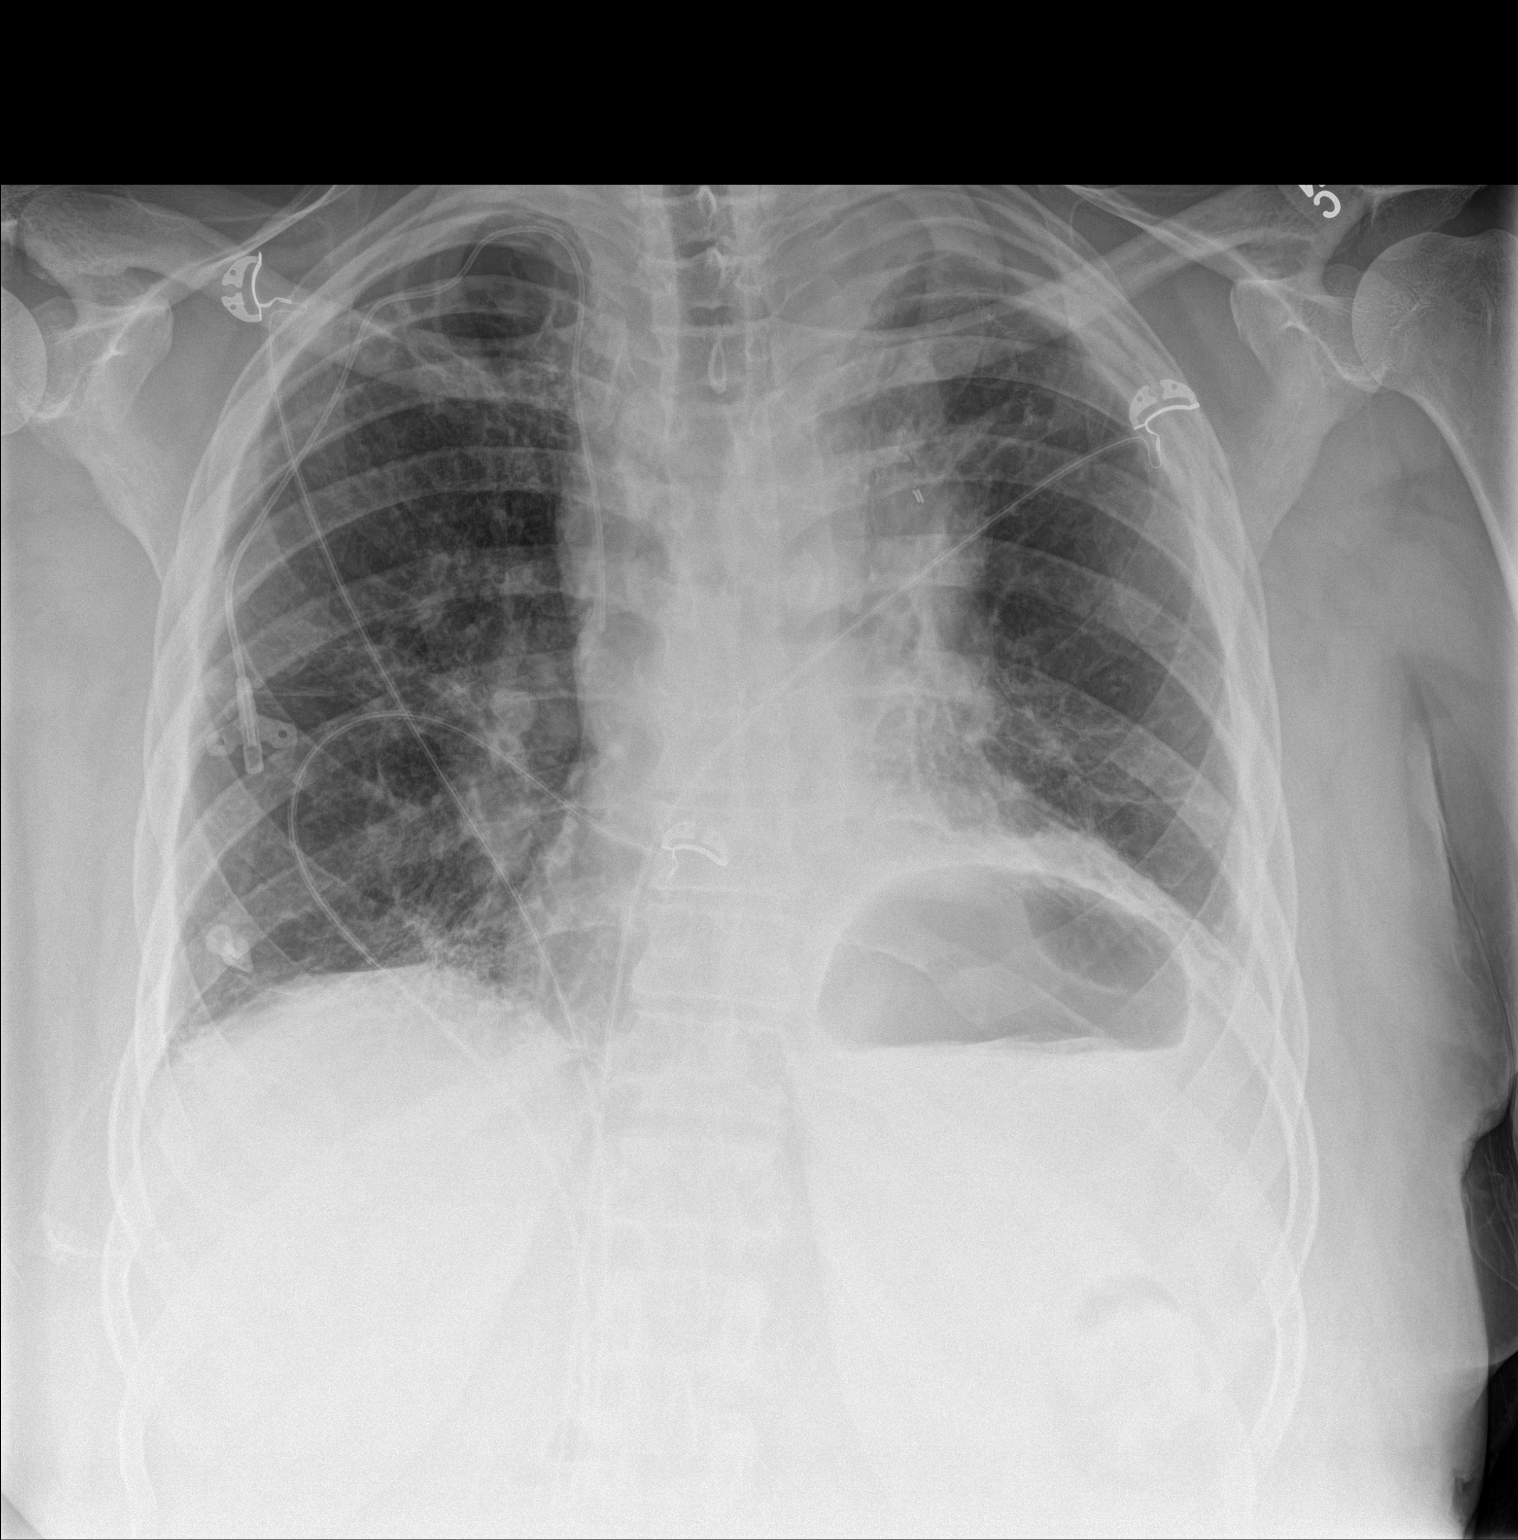

[chest lat]
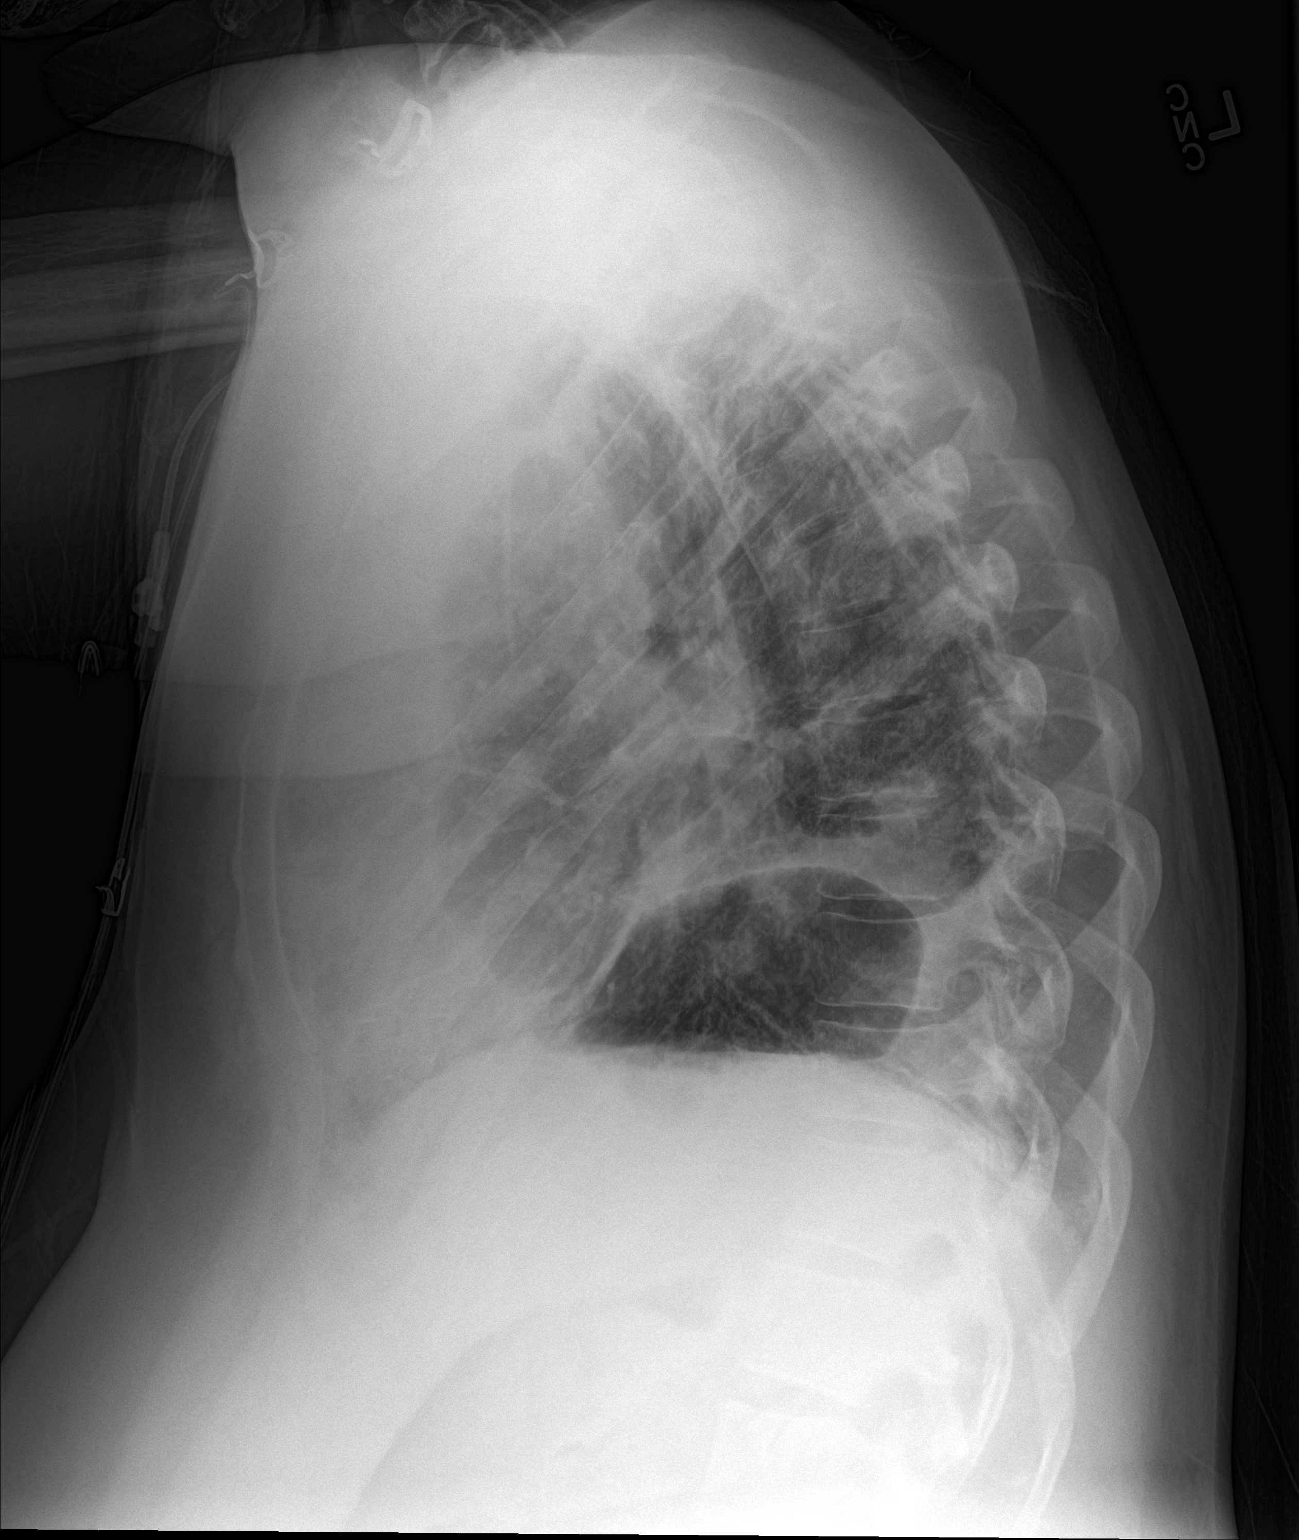

[2 of 2 positions shown; findings below may reference images not displayed]

FINDINGS: Cardiac shadow is stable. Elevation of the left hemidiaphragm is
seen consistent with the recent surgical history. The bullous
changes in the left apex are less well visualized on the current
study. A right tunneled central venous line is seen in satisfactory
position. Chronic changes in the right lung base are noted. No acute
abnormality is seen.
IMPRESSION: Postsurgical change on the left with volume loss. No other focal
abnormality is noted.

## 2016-08-02 ENCOUNTER — Other Ambulatory Visit: Payer: Self-pay | Admitting: Specialist

## 2016-08-02 DIAGNOSIS — D869 Sarcoidosis, unspecified: Secondary | ICD-10-CM

## 2016-08-02 DIAGNOSIS — R0602 Shortness of breath: Secondary | ICD-10-CM

## 2016-08-10 ENCOUNTER — Ambulatory Visit
Admission: RE | Admit: 2016-08-10 | Discharge: 2016-08-10 | Disposition: A | Discharge status: Home / Self Care | Payer: Medicare Other | Source: Ambulatory Visit | Attending: Specialist | Admitting: Specialist

## 2016-08-25 ENCOUNTER — Encounter: Payer: Self-pay | Admitting: Emergency Medicine

## 2016-08-25 ENCOUNTER — Emergency Department: Payer: Medicare Other

## 2016-08-25 ENCOUNTER — Inpatient Hospital Stay
Admission: EM | Admit: 2016-08-25 | Discharge: 2016-08-27 | DRG: 871 | Disposition: A | Discharge status: Home / Self Care | Payer: Medicare Other | Attending: Internal Medicine | Admitting: Internal Medicine

## 2016-08-25 DIAGNOSIS — J9621 Acute and chronic respiratory failure with hypoxia: Secondary | ICD-10-CM | POA: Diagnosis present

## 2016-08-25 DIAGNOSIS — E871 Hypo-osmolality and hyponatremia: Secondary | ICD-10-CM | POA: Diagnosis present

## 2016-08-25 DIAGNOSIS — A419 Sepsis, unspecified organism: Principal | ICD-10-CM | POA: Diagnosis present

## 2016-08-25 DIAGNOSIS — J189 Pneumonia, unspecified organism: Secondary | ICD-10-CM | POA: Diagnosis present

## 2016-08-25 DIAGNOSIS — J181 Lobar pneumonia, unspecified organism: Secondary | ICD-10-CM

## 2016-08-25 DIAGNOSIS — J441 Chronic obstructive pulmonary disease with (acute) exacerbation: Secondary | ICD-10-CM | POA: Diagnosis present

## 2016-08-25 DIAGNOSIS — E860 Dehydration: Secondary | ICD-10-CM | POA: Diagnosis present

## 2016-08-25 DIAGNOSIS — Z9981 Dependence on supplemental oxygen: Secondary | ICD-10-CM | POA: Diagnosis not present

## 2016-08-25 DIAGNOSIS — Z7982 Long term (current) use of aspirin: Secondary | ICD-10-CM | POA: Diagnosis not present

## 2016-08-25 DIAGNOSIS — B449 Aspergillosis, unspecified: Secondary | ICD-10-CM | POA: Diagnosis present

## 2016-08-25 DIAGNOSIS — Z833 Family history of diabetes mellitus: Secondary | ICD-10-CM | POA: Diagnosis not present

## 2016-08-25 DIAGNOSIS — N289 Disorder of kidney and ureter, unspecified: Secondary | ICD-10-CM

## 2016-08-25 DIAGNOSIS — E875 Hyperkalemia: Secondary | ICD-10-CM | POA: Diagnosis present

## 2016-08-25 DIAGNOSIS — I10 Essential (primary) hypertension: Secondary | ICD-10-CM | POA: Diagnosis present

## 2016-08-25 DIAGNOSIS — N179 Acute kidney failure, unspecified: Secondary | ICD-10-CM | POA: Diagnosis present

## 2016-08-25 DIAGNOSIS — Z79899 Other long term (current) drug therapy: Secondary | ICD-10-CM | POA: Diagnosis not present

## 2016-08-25 DIAGNOSIS — Z7951 Long term (current) use of inhaled steroids: Secondary | ICD-10-CM

## 2016-08-25 DIAGNOSIS — D860 Sarcoidosis of lung: Secondary | ICD-10-CM | POA: Diagnosis present

## 2016-08-25 DIAGNOSIS — E119 Type 2 diabetes mellitus without complications: Secondary | ICD-10-CM | POA: Diagnosis present

## 2016-08-25 DIAGNOSIS — R0600 Dyspnea, unspecified: Secondary | ICD-10-CM

## 2016-08-25 DIAGNOSIS — Z7984 Long term (current) use of oral hypoglycemic drugs: Secondary | ICD-10-CM

## 2016-08-25 DIAGNOSIS — J440 Chronic obstructive pulmonary disease with acute lower respiratory infection: Secondary | ICD-10-CM | POA: Diagnosis present

## 2016-08-25 LAB — CBC WITH DIFFERENTIAL/PLATELET
BASOS PCT: 1 %
Basophils Absolute: 0.1 10*3/uL (ref 0–0.1)
EOS ABS: 0.4 10*3/uL (ref 0–0.7)
EOS PCT: 3 %
HCT: 33 % — ABNORMAL LOW (ref 35.0–47.0)
Hemoglobin: 10.9 g/dL — ABNORMAL LOW (ref 12.0–16.0)
Lymphocytes Relative: 11 %
Lymphs Abs: 1.4 10*3/uL (ref 1.0–3.6)
MCH: 27.2 pg (ref 26.0–34.0)
MCHC: 33.1 g/dL (ref 32.0–36.0)
MCV: 82 fL (ref 80.0–100.0)
MONO ABS: 2 10*3/uL — AB (ref 0.2–0.9)
MONOS PCT: 16 %
NEUTROS PCT: 69 %
Neutro Abs: 8.8 10*3/uL — ABNORMAL HIGH (ref 1.4–6.5)
PLATELETS: 356 10*3/uL (ref 150–440)
RBC: 4.02 MIL/uL (ref 3.80–5.20)
RDW: 12.8 % (ref 11.5–14.5)
WBC: 12.8 10*3/uL — ABNORMAL HIGH (ref 3.6–11.0)

## 2016-08-25 LAB — EXPECTORATED SPUTUM ASSESSMENT W GRAM STAIN, RFLX TO RESP C

## 2016-08-25 LAB — COMPREHENSIVE METABOLIC PANEL
ALBUMIN: 3.2 g/dL — AB (ref 3.5–5.0)
ALK PHOS: 92 U/L (ref 38–126)
ALT: 14 U/L (ref 14–54)
ANION GAP: 9 (ref 5–15)
AST: 17 U/L (ref 15–41)
BILIRUBIN TOTAL: 0.7 mg/dL (ref 0.3–1.2)
BUN: 20 mg/dL (ref 6–20)
CALCIUM: 9.3 mg/dL (ref 8.9–10.3)
CO2: 30 mmol/L (ref 22–32)
CREATININE: 1.69 mg/dL — AB (ref 0.44–1.00)
Chloride: 89 mmol/L — ABNORMAL LOW (ref 101–111)
GFR calc Af Amer: 39 mL/min — ABNORMAL LOW (ref >60)
GFR calc non Af Amer: 33 mL/min — ABNORMAL LOW (ref >60)
GLUCOSE: 252 mg/dL — AB (ref 65–99)
Potassium: 5.8 mmol/L — ABNORMAL HIGH (ref 3.5–5.1)
Sodium: 128 mmol/L — ABNORMAL LOW (ref 135–145)
TOTAL PROTEIN: 8.5 g/dL — AB (ref 6.5–8.1)

## 2016-08-25 LAB — EXPECTORATED SPUTUM ASSESSMENT W REFEX TO RESP CULTURE

## 2016-08-25 LAB — GLUCOSE, CAPILLARY
Glucose-Capillary: 228 mg/dL — ABNORMAL HIGH (ref 65–99)
Glucose-Capillary: 225 mg/dL — ABNORMAL HIGH (ref 65–99)

## 2016-08-25 LAB — LIPASE, BLOOD: Lipase: 24 U/L (ref 11–51)

## 2016-08-25 LAB — TROPONIN I

## 2016-08-25 MED ORDER — SODIUM CHLORIDE 0.9 % IV SOLN
INTRAVENOUS | Status: DC
  Administered 2016-08-25 – 2016-08-26 (×2): via INTRAVENOUS

## 2016-08-25 MED ORDER — LEVOFLOXACIN 500 MG PO TABS
500.0000 mg | ORAL_TABLET | Freq: Once | ORAL | Status: AC
  Administered 2016-08-25: 500 mg via ORAL
  Filled 2016-08-25: qty 1

## 2016-08-25 MED ORDER — PRAVASTATIN SODIUM 40 MG PO TABS
40.0000 mg | ORAL_TABLET | Freq: Every day | ORAL | Status: DC
  Administered 2016-08-25 – 2016-08-26 (×2): 40 mg via ORAL
  Filled 2016-08-25 (×2): qty 1

## 2016-08-25 MED ORDER — BUDESONIDE 0.5 MG/2ML IN SUSP
0.5000 mg | Freq: Two times a day (BID) | RESPIRATORY_TRACT | Status: DC
  Administered 2016-08-26 – 2016-08-27 (×3): 0.5 mg via RESPIRATORY_TRACT
  Filled 2016-08-25 (×3): qty 2

## 2016-08-25 MED ORDER — SODIUM CHLORIDE 0.9 % IV BOLUS (SEPSIS)
500.0000 mL | Freq: Once | INTRAVENOUS | Status: AC
  Administered 2016-08-25: 500 mL via INTRAVENOUS

## 2016-08-25 MED ORDER — SODIUM CHLORIDE 0.9 % IV BOLUS (SEPSIS)
1000.0000 mL | Freq: Once | INTRAVENOUS | Status: AC
  Administered 2016-08-25: 1000 mL via INTRAVENOUS

## 2016-08-25 MED ORDER — IOPAMIDOL (ISOVUE-370) INJECTION 76%
60.0000 mL | Freq: Once | INTRAVENOUS | Status: AC | PRN
  Administered 2016-08-25: 60 mL via INTRAVENOUS

## 2016-08-25 MED ORDER — LEVOFLOXACIN 250 MG PO TABS
500.0000 mg | ORAL_TABLET | ORAL | Status: DC
  Administered 2016-08-26: 500 mg via ORAL
  Filled 2016-08-25: qty 2

## 2016-08-25 MED ORDER — CYCLOBENZAPRINE HCL 10 MG PO TABS: 10.0000 mg | ORAL_TABLET | Freq: Three times a day (TID) | ORAL | Status: DC | PRN

## 2016-08-25 MED ORDER — ASPIRIN EC 325 MG PO TBEC
325.0000 mg | DELAYED_RELEASE_TABLET | Freq: Every day | ORAL | Status: DC
  Administered 2016-08-25 – 2016-08-27 (×3): 325 mg via ORAL
  Filled 2016-08-25 (×3): qty 1

## 2016-08-25 MED ORDER — IPRATROPIUM-ALBUTEROL 0.5-2.5 (3) MG/3ML IN SOLN
3.0000 mL | Freq: Once | RESPIRATORY_TRACT | Status: AC
  Administered 2016-08-25: 3 mL via RESPIRATORY_TRACT
  Filled 2016-08-25: qty 3

## 2016-08-25 MED ORDER — GABAPENTIN 300 MG PO CAPS
300.0000 mg | ORAL_CAPSULE | Freq: Three times a day (TID) | ORAL | Status: DC
  Administered 2016-08-25 – 2016-08-27 (×6): 300 mg via ORAL
  Filled 2016-08-25 (×6): qty 1

## 2016-08-25 MED ORDER — INSULIN ASPART 100 UNIT/ML ~~LOC~~ SOLN
0.0000 [IU] | Freq: Every day | SUBCUTANEOUS | Status: DC
  Administered 2016-08-25: 2 [IU] via SUBCUTANEOUS
  Administered 2016-08-26: 6 [IU] via SUBCUTANEOUS
  Filled 2016-08-25: qty 6
  Filled 2016-08-25: qty 2

## 2016-08-25 MED ORDER — ALBUTEROL SULFATE HFA 108 (90 BASE) MCG/ACT IN AERS: 2.0000 | INHALATION_SPRAY | Freq: Four times a day (QID) | RESPIRATORY_TRACT | Status: DC | PRN

## 2016-08-25 MED ORDER — INSULIN ASPART 100 UNIT/ML ~~LOC~~ SOLN
0.0000 [IU] | Freq: Three times a day (TID) | SUBCUTANEOUS | Status: DC
  Administered 2016-08-25: 3 [IU] via SUBCUTANEOUS
  Administered 2016-08-26: 7 [IU] via SUBCUTANEOUS
  Administered 2016-08-26: 3 [IU] via SUBCUTANEOUS
  Administered 2016-08-26: 2 [IU] via SUBCUTANEOUS
  Administered 2016-08-27: 9 [IU] via SUBCUTANEOUS
  Filled 2016-08-25: qty 9
  Filled 2016-08-25: qty 3
  Filled 2016-08-25: qty 7
  Filled 2016-08-25: qty 2
  Filled 2016-08-25: qty 3

## 2016-08-25 MED ORDER — SODIUM POLYSTYRENE SULFONATE 15 GM/60ML PO SUSP
30.0000 g | ORAL | Status: AC
  Administered 2016-08-25: 30 g via ORAL
  Filled 2016-08-25: qty 120

## 2016-08-25 MED ORDER — OXYCODONE-ACETAMINOPHEN 5-325 MG PO TABS
1.0000 | ORAL_TABLET | Freq: Four times a day (QID) | ORAL | Status: DC | PRN
  Administered 2016-08-25: 1 via ORAL
  Filled 2016-08-25: qty 1

## 2016-08-25 MED ORDER — ENOXAPARIN SODIUM 40 MG/0.4ML ~~LOC~~ SOLN
40.0000 mg | SUBCUTANEOUS | Status: DC
  Administered 2016-08-25 – 2016-08-26 (×2): 40 mg via SUBCUTANEOUS
  Filled 2016-08-25 (×2): qty 0.4

## 2016-08-25 MED ORDER — LEVOFLOXACIN IN D5W 750 MG/150ML IV SOLN: 750.0000 mg | INTRAVENOUS | Status: DC

## 2016-08-25 MED ORDER — ALBUTEROL SULFATE (2.5 MG/3ML) 0.083% IN NEBU
2.5000 mg | INHALATION_SOLUTION | Freq: Four times a day (QID) | RESPIRATORY_TRACT | Status: DC | PRN
Start: 2016-08-25 — End: 2016-08-27
  Administered 2016-08-26: 2.5 mg via RESPIRATORY_TRACT
  Filled 2016-08-25 (×3): qty 3

## 2016-08-25 MED ORDER — LEVOFLOXACIN 500 MG PO TABS: 500.0000 mg | ORAL_TABLET | Freq: Every day | ORAL | Status: DC

## 2016-08-25 NOTE — Progress Notes (Signed)
Pharmacy Antibiotic Note  Charlotte Cross is a 51 y.o. female admitted on 08/25/2016 with pneumonia.  Pharmacy has been consulted for levofloxacin dosing.  Plan: Levofloxacin 500 mg PO x1 given in the ED.  Levofloxacin 500 mg PO Q24 hr ordered to start tomorrow.   Height: 5' 11" (180.3 cm) Weight: 235 lb (106.6 kg) IBW/kg (Calculated) : 70.8  Temp (24hrs), Avg:99.7 F (37.6 C), Min:99.1 F (37.3 C), Max:100.3 F (37.9 C)   Recent Labs Lab 08/25/16 1131  WBC 12.8*  CREATININE 1.69*    Estimated Creatinine Clearance: 51.7 mL/min (A) (by C-G formula based on SCr of 1.69 mg/dL (H)).    No Known Allergies  Antimicrobials this admission: Levofloxacin 08/25/16 >>   Thank you for allowing pharmacy to be a part of this patient's care.  Loree Fee, PharmD 08/25/2016 4:51 PM

## 2016-08-25 NOTE — H&P (Signed)
[facility] at [place] NAME: Charlotte Cross    MR#:  [id number]  DATE OF BIRTH:  05/14/1965  DATE OF ADMISSION:  08/25/2016  PRIMARY CARE PHYSICIAN: Richrd Humbles, MD   REQUESTING/REFERRING PHYSICIAN: Joni Fears  CHIEF COMPLAINT:  Shortness of breath and cough  HISTORY OF PRESENT ILLNESS:  Charlotte Cross  is a 51 y.o. female with a known history of Diabetes mellitus, hypertension, COPD, sarcoidosis is presenting to the ED with a chief complaint of shortness of breath which has been progressively getting worse for the past 2 weeks. Patient lives on 2 L of oxygen via nasal cannula and currently she is at 4 L of oxygen and reports nonproductive cough with chills but no fever. Patient was seen by her pulmonologist Dr. Raul Del 2 days ago who started her on levofloxacin. CT chest has revealed left upper lobe consolidation  PAST MEDICAL HISTORY:   Past Medical History:  Diagnosis Date  . 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 COPD (chronic obstructive pulmonary disease) ([place])   . Diabetes mellitus without complication ([place])   . Hypertension   . Sarcoidosis     PAST SURGICAL HISTOIRY:   Past Surgical History:  Procedure Laterality Date  . LUNG REMOVAL, PARTIAL     left    SOCIAL HISTORY:   Social History  Substance Use Topics  . Smoking status: Never Smoker  . Smokeless tobacco: Not on file  . Alcohol use No    FAMILY HISTORY:   Family History  Problem Relation Age of Onset  . Diabetes Mother   . Diabetes Father     DRUG ALLERGIES:  No Known Allergies  REVIEW OF SYSTEMS:  CONSTITUTIONAL: No fever, fatigue or weakness.  EYES: No blurred or double vision.  EARS, NOSE, AND THROAT: No tinnitus or ear pain.  RESPIRATORY: reports cough, shortness of breath,  No wheezing or hemoptysis.  CARDIOVASCULAR: No chest pain, orthopnea, edema.  GASTROINTESTINAL: No nausea, vomiting, diarrhea or abdominal pain.  GENITOURINARY: No dysuria,  hematuria.  ENDOCRINE: No polyuria, nocturia,  HEMATOLOGY: No anemia, easy bruising or bleeding SKIN: No rash or lesion. MUSCULOSKELETAL: No joint pain or arthritis.   NEUROLOGIC: No tingling, numbness, weakness.  PSYCHIATRY: No anxiety or depression.   MEDICATIONS AT HOME:   Prior to Admission medications   Medication Sig Start Date End Date Taking? Authorizing Provider  albuterol (PROVENTIL HFA;VENTOLIN HFA) 108 (90 BASE) MCG/ACT inhaler Inhale 2 puffs into the lungs every 6 (six) hours as needed for wheezing or shortness of breath.   Yes [provider]  albuterol (PROVENTIL) (2.5 MG/3ML) 0.083% nebulizer solution Inhale 2.5 mg into the lungs every 6 (six) hours as needed for wheezing or shortness of breath.    Yes [provider]  aspirin EC 325 MG tablet Take 325 mg by mouth daily.   Yes [provider]  budesonide (PULMICORT) 0.5 MG/2ML nebulizer solution Inhale 0.5 mg into the lungs every 12 (twelve) hours.   Yes [provider]  cyclobenzaprine (FLEXERIL) 10 MG tablet Take 10 mg by mouth 3 (three) times daily as needed for muscle spasms.    Yes [provider]  gabapentin (NEURONTIN) 300 MG capsule Take 300 mg by mouth 3 (three) times daily.   Yes [provider]  hydrochlorothiazide (HYDRODIURIL) 25 MG tablet Take 25 mg by mouth daily.   Yes [provider]  levofloxacin (LEVAQUIN) 500 MG tablet Take 500 mg by mouth daily. 08/23/16  Yes [provider]  lisinopril (PRINIVIL,ZESTRIL) 10 MG tablet Take 10 mg by mouth daily.    Yes [provider]  metFORMIN (GLUCOPHAGE) 1000 MG tablet Take 1,000 mg by mouth 2 (two) times daily with a meal.   Yes [provider]  pravastatin (PRAVACHOL) 40 MG tablet Take 40 mg by mouth at bedtime.   Yes [provider]  glipiZIDE (GLIPIZIDE XL) 2.5 MG 24 hr tablet Take 1 tablet (2.5 mg total) by mouth daily with breakfast. Patient not taking: Reported on  08/25/2016 12/26/14   Hillary Bow, MD      VITAL SIGNS:  Blood pressure 100/65, pulse 88, temperature 100.3 F (37.9 C), temperature source Oral, resp. rate 18, height _0  (1.803 m), weight 106.6 kg (235 lb), last menstrual period 11/24/2014, SpO2 100 %.  PHYSICAL EXAMINATION:  GENERAL:  51 y.o.-year-old patient lying in the bed with no acute distress.  EYES: Pupils equal, round, reactive to light and accommodation. No scleral icterus. Extraocular muscles intact.  HEENT: Head atraumatic, normocephalic. Oropharynx and nasopharynx clear.  NECK:  Supple, no jugular venous distention. No thyroid enlargement, no tenderness.  LUNGS: Normal breath sounds bilaterally except right sided crackles, no wheezing, rales,rhonchi or crepitation. No use of accessory muscles of respiration.  CARDIOVASCULAR: S1, S2 normal. No murmurs, rubs, or gallops.  ABDOMEN: Soft, nontender, nondistended. Bowel sounds present. No organomegaly or mass.  EXTREMITIES: No pedal edema, cyanosis, or clubbing.  NEUROLOGIC: Cranial nerves II through XII are intact. Muscle strength 5/5 in all extremities. Sensation intact. Gait not checked.  PSYCHIATRIC: The patient is alert and oriented x 3.  SKIN: No obvious rash, lesion, or ulcer.   LABORATORY PANEL:   CBC  Recent Labs Lab 08/25/16 1131  WBC 12.8*  HGB 10.9*  HCT 33.0*  PLT 356   ------------------------------------------------------------------------------------------------------------------  Chemistries   Recent Labs Lab 08/25/16 1131  NA 128*  K 5.8*  CL 89*  CO2 30  GLUCOSE 252*  BUN 20  CREATININE 1.69*  CALCIUM 9.3  AST 17  ALT 14  ALKPHOS 92  BILITOT 0.7   ------------------------------------------------------------------------------------------------------------------  Cardiac Enzymes  Recent Labs Lab 08/25/16 1131  TROPONINI <0.03    ------------------------------------------------------------------------------------------------------------------  RADIOLOGY:  Ct Angio Chest Pe W Or Wo Contrast  Addendum Date: 08/25/2016   ADDENDUM REPORT: 08/25/2016 14:54 ADDENDUM: The central pulmonary arteries are dilated, compatible with pulmonary arterial hypertension. Electronically Signed   By: Claudie Revering M.D.   On: 08/25/2016 14:54   Result Date: 08/25/2016 CLINICAL DATA:  Shortness of breath. Hypoxia. Dyspnea. Chest pain and myalgias. Coughing. History of sarcoidosis and aspergillosis. EXAM: CT ANGIOGRAPHY CHEST WITH CONTRAST TECHNIQUE: Multidetector CT imaging of the chest was performed using the standard protocol during bolus administration of intravenous contrast. Multiplanar CT image reconstructions and MIPs were obtained to evaluate the vascular anatomy. CONTRAST:  60 cc Isovue 370 COMPARISON:  Chest radiographs dated 12/24/2014 and chest CT dated 11/14/2014. FINDINGS: Cardiovascular: Normally opacified pulmonary arteries with no pulmonary arterial filling defects. The central pulmonary artery tree enlarged. The main pulmonary artery measures 4.0 cm in maximum diameter on axial image number 121 of series 6. Atheromatous arterial calcifications, including the aorta. Normal sized heart. Mediastinum/Nodes: Multiple enlarged mediastinal, bilateral hilar and bilateral supraclavicular lymph nodes are again demonstrated. A previously demonstrated 2.7 cm short axis prevascular node has a corresponding short axis diameter of 2.3 cm today on image number 38 of series 5. A previously demonstrated 2.0 cm short axis left hilar no has a corresponding diameter of 1.9 cm today  on image number 39 of series 5. A previously demonstrated 2.4 cm short axis right hilar node has a corresponding diameter of 2.7 cm on image number 45 of series 5. A previously demonstrated 2.9 cm short axis subcarinal node has a short axis diameter of 3.4 cm on image number 52 of  series 5, previously 3.6 cm measured at the same location. No discrete thyroid nodules are seen. Lungs/Pleura: Interval dense, confluent opacification in the left upper lobe with an interval decrease in size of the apical cavity containing fluid. The apical cavity previously measured 6.4 x 5.6 cm and currently measures 3.3 x 2.7 cm. The previously demonstrated 6.0 x 5.8 cm right apical cavity measures 6.5 x 6.0 cm today. Honeycombing and the lower lung zones has not changed significantly. No pleural fluid seen. Upper Abdomen: Unremarkable. Musculoskeletal: Mild thoracic and lower cervical spine degenerative changes. Review of the MIP images confirms the above findings. IMPRESSION: 1. Interval dense left upper lobe consolidation with air bronchograms, concerning for pneumonia. 2. No pulmonary emboli. 3. Interval decrease in size of the left apical cavity containing fluid. 4. Interval mild increase in size of the right apical cavity containing fluid. 5. Stable changes of honeycombing in both lungs compatible with the history of sarcoidosis. 6. No significant change in extensive mediastinal, bilateral hilar and bilateral supraclavicular adenopathy compatible with the history of sarcoidosis. 7. Aortic atherosclerosis. Electronically Signed: By: Claudie Revering M.D. On: 08/25/2016 13:51    EKG:   Orders placed or performed during the hospital encounter of 08/25/16  . EKG 12-Lead  . EKG 12-Lead    IMPRESSION AND PLAN:  Charlotte Cross  is a 51 y.o. female with a known history of Diabetes mellitus, hypertension, COPD, sarcoidosis is presenting to the ED with a chief complaint of shortness of breath which has been progressively getting worse for the past 2 weeks. Patient lives on 2 L of oxygen via nasal cannula and currently she is at 4 L of oxygen and reports nonproductive cough with chills but no fever.   # Sepsis secondary to pneumonia and acute on chronic hypoxic respiratory failure secondary to pneumonia  underlying COPD and sarcoidosis Admitted to MedSurg unit Meets septic criteria with hypotension and leukocytosis IV levofloxacin for community-acquired pneumonia Sputum culture and sensitivity if patient could expectorate some phlegm Nebulizer treatments as needed Continue oxygen 4 L and wean off as tolerated to 2 L of home oxygen  #Acute kidney injury from poor by mouth intake Gentle hydration with IV fluids Avoid nephrotoxins Holding metformin, lisinopril and hydrochlorothiazide  #Hyponatremia 2/2 dehydration from poor by mouth intake Hydrate with IV fluids Monitor BMP  #Hyperkalemia Holding lisinopril Kayexalate and repeat BMP  #Diabetes mellitus Check hemoglobin A1c, sliding scale insulin Holding metformin in view of aki  #History of sarcoidosis Currently not on any medications other than breathing treatments as needed  Provide GI and DVT prophylaxis  All the records are reviewed and case discussed with ED provider. Management plans discussed with the patient, family and they are in agreement.  CODE STATUS: fc, husband is HCPOA  TOTAL TIME TAKING CARE OF THIS PATIENT: 45 minutes.   Note: This dictation was prepared with Dragon dictation along with smaller phrase technology. Any transcriptional errors that result from this process are unintentional.  Nicholes Mango M.D on 08/25/2016 at 3:09 PM  Between 7am to 6pm - Pager - [phone]  After 6pm go to [email or web address] - password [email or web address]  [facility]  Office  [phone]  CC:  Primary care physician; Richrd Humbles, MD

## 2016-08-25 NOTE — ED Provider Notes (Signed)
[facility] Emergency Department Provider Note  ____________________________________________  Time seen: Approximately 11:26 AM  I have reviewed the triage vital signs and the nursing notes.   HISTORY  Chief Complaint Shortness of Breath    HPI Charlotte Cross is a 51 y.o. female who complains of shortness of breath for the past 2 weeks. She has chronic respiratory failure, on 2 L nasal cannula at all times. She reports she has increased it to 4 L recently for comfort. She is having worse nonproductive cough. Has chills but no frank fever.   Reports chest discomfort with deep breathing and coughing. Denies history of DVT or PE but states that she has essentially been staying in bed for the past week due to her worsening symptoms. Also reports poor oral intake over the past 2 weeks with occasional vomiting. Denies abdominal pain. Chest pain nonradiating, sharp, intermittent without alleviating factors.. Recently saw pulmonology 2 days ago who planned a CT chest with contrast.     Past Medical History:  Diagnosis Date  . 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 COPD (chronic obstructive pulmonary disease) ([place])   . Diabetes mellitus without complication ([place])   . Hypertension   . Sarcoidosis      Patient Active Problem List   Diagnosis Date Noted  . Hypoglycemia 12/24/2014  . Hyperkalemia 12/24/2014  . Hemoptysis 11/15/2014     Past Surgical History:  Procedure Laterality Date  . LUNG REMOVAL, PARTIAL     left     Prior to Admission medications   Medication Sig Start Date End Date Taking? Authorizing Provider  albuterol (PROVENTIL HFA;VENTOLIN HFA) 108 (90 BASE) MCG/ACT inhaler Inhale 2 puffs into the lungs every 6 (six) hours as needed for wheezing or shortness of breath.   Yes [provider]  albuterol (PROVENTIL) (2.5 MG/3ML) 0.083% nebulizer solution Inhale 2.5 mg into the lungs every 6 (six) hours as needed for wheezing or shortness of breath.     Yes [provider]  aspirin EC 325 MG tablet Take 325 mg by mouth daily.   Yes [provider]  budesonide (PULMICORT) 0.5 MG/2ML nebulizer solution Inhale 0.5 mg into the lungs every 12 (twelve) hours.   Yes [provider]  cyclobenzaprine (FLEXERIL) 10 MG tablet Take 10 mg by mouth 3 (three) times daily as needed for muscle spasms.    Yes [provider]  gabapentin (NEURONTIN) 300 MG capsule Take 300 mg by mouth 3 (three) times daily.   Yes [provider]  hydrochlorothiazide (HYDRODIURIL) 25 MG tablet Take 25 mg by mouth daily.   Yes [provider]  levofloxacin (LEVAQUIN) 500 MG tablet Take 500 mg by mouth daily. 08/23/16  Yes [provider]  lisinopril (PRINIVIL,ZESTRIL) 10 MG tablet Take 10 mg by mouth daily.    Yes [provider]  metFORMIN (GLUCOPHAGE) 1000 MG tablet Take 1,000 mg by mouth 2 (two) times daily with a meal.   Yes [provider]  pravastatin (PRAVACHOL) 40 MG tablet Take 40 mg by mouth at bedtime.   Yes [provider]  glipiZIDE (GLIPIZIDE XL) 2.5 MG 24 hr tablet Take 1 tablet (2.5 mg total) by mouth daily with breakfast. Patient not taking: Reported on 08/25/2016 12/26/14   Hillary Bow, MD     Allergies Patient has no known allergies.   Family History  Problem Relation Age of Onset  . Diabetes Mother   . Diabetes Father     Social History Social History  Substance  Use Topics  . Smoking status: Never Smoker  . Smokeless tobacco: Not on file  . Alcohol use No    Review of Systems  Constitutional:   No fever Positive chills.  ENT:   No sore throat. No rhinorrhea. Cardiovascular:   Positive chest pain as above without syncope. Respiratory:   Positive shortness of breath and nonproductive cough. Gastrointestinal:   Negative for abdominal pain, positive occasional vomiting. No diarrhea or constipation.  Musculoskeletal:   Negative for focal pain or swelling All  other systems reviewed and are negative except as documented above in ROS and HPI.  ____________________________________________   PHYSICAL EXAM:  VITAL SIGNS: ED Triage Vitals  Enc Vitals Group     BP 08/25/16 1048 103/70     Pulse Rate 08/25/16 1048 99     Resp 08/25/16 1048 (!) 22     Temp 08/25/16 1048 100.3 F (37.9 C)     Temp Source 08/25/16 1048 Oral     SpO2 08/25/16 1048 95 %     Weight 08/25/16 1048 235 lb (106.6 kg)     Height 08/25/16 1048 _0  (1.803 m)     Head Circumference --      Peak Flow --      Pain Score 08/25/16 1047 5     Pain Loc --      Pain Edu? --      Excl. in [place]? --     Vital signs reviewed, nursing assessments reviewed.   Constitutional:   Alert and oriented. Not in distress. Eyes:   No scleral icterus.  EOMI. No nystagmus. No conjunctival pallor. PERRL. ENT   Head:   Normocephalic and atraumatic.   Nose:   No congestion/rhinnorhea.    Mouth/Throat:   MMM, no pharyngeal erythema. No peritonsillar mass.    Neck:   No meningismus. Full ROM Hematological/Lymphatic/Immunilogical:   No cervical lymphadenopathy. Cardiovascular:   RRR. Symmetric bilateral radial and DP pulses.  No murmurs.  Respiratory:   Normal respiratory effort without tachypnea/retractions. Coarse breath sounds with diffuse expiratory wheezing. Good air entry in all lung fields, diminished air entry in left base as expected, status post left lower lobe resection. No focal crackles. Gastrointestinal:   Soft and nontender. Non distended. There is no CVA tenderness.  No rebound, rigidity, or guarding. Genitourinary:   deferred Musculoskeletal:   Normal range of motion in all extremities. No joint effusions.  No lower extremity tenderness.  No edema. Neurologic:   Normal speech and language.  Motor grossly intact. No gross focal neurologic deficits are appreciated.  Skin:    Skin is warm, dry and intact. No rash noted.  No petechiae, purpura, or  bullae.  ____________________________________________    LABS (pertinent positives/negatives) (all labs ordered are listed, but only abnormal results are displayed) Labs Reviewed  COMPREHENSIVE METABOLIC PANEL - Abnormal; Notable for the following:       Result Value   Sodium 128 (*)    Potassium 5.8 (*)    Chloride 89 (*)    Glucose, Bld 252 (*)    Creatinine, Ser 1.69 (*)    Total Protein 8.5 (*)    Albumin 3.2 (*)    GFR calc non Af Amer 33 (*)    GFR calc Af Amer 39 (*)    All other components within normal limits  CBC WITH DIFFERENTIAL/PLATELET - Abnormal; Notable for the following:    WBC 12.8 (*)    Hemoglobin 10.9 (*)    HCT 33.0 (*)  Neutro Abs 8.8 (*)    Monocytes Absolute 2.0 (*)    All other components within normal limits  LIPASE, BLOOD  TROPONIN I   ____________________________________________   EKG  Interpreted by me Sinus rhythm rate of 96, normal axis intervals and QRS. Normal ST segments. T wave inversions in V2 and V3. Anterior T-wave inversions appear to be new compared to September 2016.  ____________________________________________    RADIOLOGY  Ct Angio Chest Pe W Or Wo Contrast  Result Date: 08/25/2016 CLINICAL DATA:  Shortness of breath. Hypoxia. Dyspnea. Chest pain and myalgias. Coughing. History of sarcoidosis and aspergillosis. EXAM: CT ANGIOGRAPHY CHEST WITH CONTRAST TECHNIQUE: Multidetector CT imaging of the chest was performed using the standard protocol during bolus administration of intravenous contrast. Multiplanar CT image reconstructions and MIPs were obtained to evaluate the vascular anatomy. CONTRAST:  60 cc Isovue 370 COMPARISON:  Chest radiographs dated 12/24/2014 and chest CT dated 11/14/2014. FINDINGS: Cardiovascular: Normally opacified pulmonary arteries with no pulmonary arterial filling defects. The central pulmonary artery tree enlarged. The main pulmonary artery measures 4.0 cm in maximum diameter on axial image number 121  of series 6. Atheromatous arterial calcifications, including the aorta. Normal sized heart. Mediastinum/Nodes: Multiple enlarged mediastinal, bilateral hilar and bilateral supraclavicular lymph nodes are again demonstrated. A previously demonstrated 2.7 cm short axis prevascular node has a corresponding short axis diameter of 2.3 cm today on image number 38 of series 5. A previously demonstrated 2.0 cm short axis left hilar no has a corresponding diameter of 1.9 cm today on image number 39 of series 5. A previously demonstrated 2.4 cm short axis right hilar node has a corresponding diameter of 2.7 cm on image number 45 of series 5. A previously demonstrated 2.9 cm short axis subcarinal node has a short axis diameter of 3.4 cm on image number 52 of series 5, previously 3.6 cm measured at the same location. No discrete thyroid nodules are seen. Lungs/Pleura: Interval dense, confluent opacification in the left upper lobe with an interval decrease in size of the apical cavity containing fluid. The apical cavity previously measured 6.4 x 5.6 cm and currently measures 3.3 x 2.7 cm. The previously demonstrated 6.0 x 5.8 cm right apical cavity measures 6.5 x 6.0 cm today. Honeycombing and the lower lung zones has not changed significantly. No pleural fluid seen. Upper Abdomen: Unremarkable. Musculoskeletal: Mild thoracic and lower cervical spine degenerative changes. Review of the MIP images confirms the above findings. IMPRESSION: 1. Interval dense left upper lobe consolidation with air bronchograms, concerning for pneumonia. 2. No pulmonary emboli. 3. Interval decrease in size of the left apical cavity containing fluid. 4. Interval mild increase in size of the right apical cavity containing fluid. 5. Stable changes of honeycombing in both lungs compatible with the history of sarcoidosis. 6. No significant change in extensive mediastinal, bilateral hilar and bilateral supraclavicular adenopathy compatible with the history  of sarcoidosis. 7. Aortic atherosclerosis. Electronically Signed   By: Claudie Revering M.D.   On: 08/25/2016 13:51    ____________________________________________   PROCEDURES Procedures  ____________________________________________   INITIAL IMPRESSION / ASSESSMENT AND PLAN / ED COURSE  Pertinent labs & imaging results that were available during my care of the patient were reviewed by me and considered in my medical decision making (see chart for details).  Patient presents with shortness of breath, subacute in nature with low-grade fever. complicated medical history. We'll obtain CT angiogram of the chest to evaluate for PE, pneumonia, aspergillosis recurrence. Low suspicion for pneumothorax.  Has new T-wave inversions that occurred sometime in the last 2 years. Symptoms are currently not consistent with ACS, but we'll check a troponin for screening given this newly observed change. EKG is otherwise nonischemic.   Clinical Course as of Aug 25 1401  Fri Aug 25, 2016  1257 Chemistry reveals AKI with hyponatremia and mild hyperkalemia. Continue NS bolus hydration  [PS]  1400 CT c/w LUL pnemonia. Pt on levaquin. Will continue, plan to admit given AKI, electrolyte disturbance, and severe comorbidities  [PS]    Clinical Course User Index [PS] Carrie Mew, MD     ____________________________________________   FINAL CLINICAL IMPRESSION(S) / ED DIAGNOSES  Final diagnoses:  Dyspnea  Pneumonia of left upper lobe due to infectious organism [facility])  Acute renal insufficiency  Hyponatremia      New Prescriptions   No medications on file     Portions of this note were generated with dragon dictation software. Dictation errors may occur despite best attempts at proofreading.    Carrie Mew, MD 08/25/16 [phone]

## 2016-08-25 NOTE — ED Triage Notes (Signed)
Pt c/o shortness of breath hx of sarcodiosis and aspergilosis. States feels the same. Increased oxygen from 2L to 4L continuous at home. Pt also c/o body aches and chest pain worse when coughing. Pt able to speak in full sentences without running out of breath.

## 2016-08-25 NOTE — Progress Notes (Signed)
Patient reported pain with coughing, this RN notified prime doc, new orders to be placed.

## 2016-08-26 LAB — GLUCOSE, CAPILLARY
GLUCOSE-CAPILLARY: 440 mg/dL — AB (ref 65–99)
Glucose-Capillary: 195 mg/dL — ABNORMAL HIGH (ref 65–99)
Glucose-Capillary: 250 mg/dL — ABNORMAL HIGH (ref 65–99)
Glucose-Capillary: 332 mg/dL — ABNORMAL HIGH (ref 65–99)
Glucose-Capillary: 440 mg/dL — ABNORMAL HIGH (ref 65–99)
Glucose-Capillary: 396 mg/dL — ABNORMAL HIGH (ref 65–99)

## 2016-08-26 LAB — STREP PNEUMONIAE URINARY ANTIGEN: Strep Pneumo Urinary Antigen: NEGATIVE

## 2016-08-26 LAB — CBC WITH DIFFERENTIAL/PLATELET
BASOS PCT: 1 %
Basophils Absolute: 0.1 10*3/uL (ref 0–0.1)
EOS ABS: 0.5 10*3/uL (ref 0–0.7)
EOS PCT: 5 %
HCT: 32.5 % — ABNORMAL LOW (ref 35.0–47.0)
HEMOGLOBIN: 10.9 g/dL — AB (ref 12.0–16.0)
LYMPHS ABS: 1.3 10*3/uL (ref 1.0–3.6)
Lymphocytes Relative: 13 %
MCH: 28.1 pg (ref 26.0–34.0)
MCHC: 33.5 g/dL (ref 32.0–36.0)
MCV: 83.9 fL (ref 80.0–100.0)
MONO ABS: 1.5 10*3/uL — AB (ref 0.2–0.9)
MONOS PCT: 14 %
Neutro Abs: 6.8 10*3/uL — ABNORMAL HIGH (ref 1.4–6.5)
Neutrophils Relative %: 67 %
PLATELETS: 286 10*3/uL (ref 150–440)
RBC: 3.88 MIL/uL (ref 3.80–5.20)
RDW: 12.5 % (ref 11.5–14.5)
WBC: 10.2 10*3/uL (ref 3.6–11.0)

## 2016-08-26 LAB — BASIC METABOLIC PANEL
Anion gap: 8 (ref 5–15)
BUN: 15 mg/dL (ref 6–20)
CALCIUM: 9 mg/dL (ref 8.9–10.3)
CHLORIDE: 97 mmol/L — AB (ref 101–111)
CO2: 30 mmol/L (ref 22–32)
CREATININE: 1.19 mg/dL — AB (ref 0.44–1.00)
GFR calc Af Amer: 59 mL/min — ABNORMAL LOW (ref >60)
GFR calc non Af Amer: 51 mL/min — ABNORMAL LOW (ref >60)
GLUCOSE: 208 mg/dL — AB (ref 65–99)
Potassium: 4.5 mmol/L (ref 3.5–5.1)
Sodium: 135 mmol/L (ref 135–145)

## 2016-08-26 LAB — RAPID HIV SCREEN (HIV 1/2 AB+AG)
HIV 1/2 ANTIBODIES: NONREACTIVE
HIV-1 P24 ANTIGEN - HIV24: NONREACTIVE

## 2016-08-26 MED ORDER — ACETAMINOPHEN 325 MG PO TABS
650.0000 mg | ORAL_TABLET | Freq: Four times a day (QID) | ORAL | Status: DC | PRN
  Administered 2016-08-26: 650 mg via ORAL
  Filled 2016-08-26: qty 2

## 2016-08-26 MED ORDER — METHYLPREDNISOLONE SODIUM SUCC 125 MG IJ SOLR
60.0000 mg | Freq: Two times a day (BID) | INTRAMUSCULAR | Status: DC
  Administered 2016-08-26 (×2): 60 mg via INTRAVENOUS
  Filled 2016-08-26 (×2): qty 2

## 2016-08-26 NOTE — Progress Notes (Signed)
[place] [facility] [place] [facility] [place] NAME: Mabrey Howland    MR#:  [id number]  DATE OF BIRTH:  [birth year]  SUBJECTIVE:  CHIEF COMPLAINT:   Chief Complaint  Patient presents with  . Shortness of Breath   Feels better. Still has SOB and yellow sputum with cough. Wears 2 L O2 at home  Temp today 102 f  REVIEW OF SYSTEMS:    Review of Systems  Constitutional: Positive for malaise/fatigue. Negative for chills and fever.  HENT: Negative for sore throat.   Eyes: Negative for blurred vision, double vision and pain.  Respiratory: Negative for cough, hemoptysis, shortness of breath and wheezing.   Cardiovascular: Negative for chest pain, palpitations, orthopnea and leg swelling.  Gastrointestinal: Negative for abdominal pain, constipation, diarrhea, heartburn, nausea and vomiting.  Genitourinary: Negative for dysuria and hematuria.  Musculoskeletal: Negative for back pain and joint pain.  Skin: Negative for rash.  Neurological: Positive for weakness. Negative for sensory change, speech change, focal weakness and headaches.  Endo/Heme/Allergies: Does not bruise/bleed easily.  Psychiatric/Behavioral: Negative for depression. The patient is not nervous/anxious.    DRUG ALLERGIES:  No Known Allergies  VITALS:  Blood pressure 112/74, pulse 93, temperature 98.5 F (36.9 C), temperature source Oral, resp. rate 18, height _0  (1.803 m), weight 106.6 kg (235 lb), last menstrual period 11/24/2014, SpO2 98 %.  PHYSICAL EXAMINATION:   Physical Exam  GENERAL:  51 y.o.-year-old patient lying in the bed with no acute distress.  EYES: Pupils equal, round, reactive to light and accommodation. No scleral icterus. Extraocular muscles intact.  HEENT: Head atraumatic, normocephalic. Oropharynx and nasopharynx clear.  NECK:  Supple, no jugular venous distention. No thyroid enlargement, no tenderness.  LUNGS: Bilateral wheezing CARDIOVASCULAR: S1, S2 normal. No murmurs,  rubs, or gallops.  ABDOMEN: Soft, nontender, nondistended. Bowel sounds present. No organomegaly or mass.  EXTREMITIES: No cyanosis, clubbing or edema b/l.    NEUROLOGIC: Cranial nerves II through XII are intact. No focal Motor or sensory deficits b/l.   PSYCHIATRIC: The patient is alert and oriented x 3.  SKIN: No obvious rash, lesion, or ulcer.   LABORATORY PANEL:   CBC  Recent Labs Lab 08/26/16 0523  WBC 10.2  HGB 10.9*  HCT 32.5*  PLT 286   ------------------------------------------------------------------------------------------------------------------ Chemistries   Recent Labs Lab 08/25/16 1131 08/26/16 0523  NA 128* 135  K 5.8* 4.5  CL 89* 97*  CO2 30 30  GLUCOSE 252* 208*  BUN 20 15  CREATININE 1.69* 1.19*  CALCIUM 9.3 9.0  AST 17  --   ALT 14  --   ALKPHOS 92  --   BILITOT 0.7  --    ------------------------------------------------------------------------------------------------------------------  Cardiac Enzymes  Recent Labs Lab 08/25/16 1131  TROPONINI <0.03   ------------------------------------------------------------------------------------------------------------------  RADIOLOGY:  Ct Angio Chest Pe W Or Wo Contrast  Addendum Date: 08/25/2016   ADDENDUM REPORT: 08/25/2016 14:54 ADDENDUM: The central pulmonary arteries are dilated, compatible with pulmonary arterial hypertension. Electronically Signed   By: Claudie Revering M.D.   On: 08/25/2016 14:54   Result Date: 08/25/2016 CLINICAL DATA:  Shortness of breath. Hypoxia. Dyspnea. Chest pain and myalgias. Coughing. History of sarcoidosis and aspergillosis. EXAM: CT ANGIOGRAPHY CHEST WITH CONTRAST TECHNIQUE: Multidetector CT imaging of the chest was performed using the standard protocol during bolus administration of intravenous contrast. Multiplanar CT image reconstructions and MIPs were obtained to evaluate the vascular anatomy. CONTRAST:  60 cc Isovue 370 COMPARISON:  Chest radiographs dated 12/24/2014  and  chest CT dated 11/14/2014. FINDINGS: Cardiovascular: Normally opacified pulmonary arteries with no pulmonary arterial filling defects. The central pulmonary artery tree enlarged. The main pulmonary artery measures 4.0 cm in maximum diameter on axial image number 121 of series 6. Atheromatous arterial calcifications, including the aorta. Normal sized heart. Mediastinum/Nodes: Multiple enlarged mediastinal, bilateral hilar and bilateral supraclavicular lymph nodes are again demonstrated. A previously demonstrated 2.7 cm short axis prevascular node has a corresponding short axis diameter of 2.3 cm today on image number 38 of series 5. A previously demonstrated 2.0 cm short axis left hilar no has a corresponding diameter of 1.9 cm today on image number 39 of series 5. A previously demonstrated 2.4 cm short axis right hilar node has a corresponding diameter of 2.7 cm on image number 45 of series 5. A previously demonstrated 2.9 cm short axis subcarinal node has a short axis diameter of 3.4 cm on image number 52 of series 5, previously 3.6 cm measured at the same location. No discrete thyroid nodules are seen. Lungs/Pleura: Interval dense, confluent opacification in the left upper lobe with an interval decrease in size of the apical cavity containing fluid. The apical cavity previously measured 6.4 x 5.6 cm and currently measures 3.3 x 2.7 cm. The previously demonstrated 6.0 x 5.8 cm right apical cavity measures 6.5 x 6.0 cm today. Honeycombing and the lower lung zones has not changed significantly. No pleural fluid seen. Upper Abdomen: Unremarkable. Musculoskeletal: Mild thoracic and lower cervical spine degenerative changes. Review of the MIP images confirms the above findings. IMPRESSION: 1. Interval dense left upper lobe consolidation with air bronchograms, concerning for pneumonia. 2. No pulmonary emboli. 3. Interval decrease in size of the left apical cavity containing fluid. 4. Interval mild increase in size of  the right apical cavity containing fluid. 5. Stable changes of honeycombing in both lungs compatible with the history of sarcoidosis. 6. No significant change in extensive mediastinal, bilateral hilar and bilateral supraclavicular adenopathy compatible with the history of sarcoidosis. 7. Aortic atherosclerosis. Electronically Signed: By: Claudie Revering M.D. On: 08/25/2016 13:51     ASSESSMENT AND PLAN:   Omolola Mittman  is a 51 y.o. female with a known history of Diabetes mellitus, hypertension, COPD, sarcoidosis is presenting to the ED with a chief complaint of shortness of breath which has been progressively getting worse for the past 2 weeks  # LUL pneumonia with COPD exacerbation And acute on chronic respiratory failure with sepsis Still having fever -IV steroids, Antibiotics - Scheduled Nebulizers - Inhalers -Wean O2 as tolerated - Consult pulmonary if no improvement  #Acute kidney injury from poor by mouth intake Resolved with IVF Stop fluids  #Hyponatremia 2/2 dehydration from poor by mouth intake Resolved  #Hyperkalemia - resolved Holding lisinopril  #Diabetes mellitus SSI  #History of sarcoidosis Currently not on any medications other than breathing treatments as needed  All the records are reviewed and case discussed with Care Management/Social Workerr. Management plans discussed with the patient, family and they are in agreement.  CODE STATUS: FULL CODE  DVT Prophylaxis: SCDs  TOTAL TIME TAKING CARE OF THIS PATIENT: 30 minutes.   POSSIBLE D/C IN 1-2 DAYS, DEPENDING ON CLINICAL CONDITION.  Hillary Bow R M.D on 08/26/2016 at 12:45 PM  Between 7am to 6pm - Pager - [phone]  After 6pm go to [email or web address] - password [email or web address] [facility]  Office  [phone]  CC: Primary care physician; Richrd Humbles, MD  Note: This dictation was prepared with Viviann Spare  dictation along with smaller phrase technology. Any transcriptional errors  that result from this process are unintentional.

## 2016-08-27 LAB — HEMOGLOBIN A1C
Hgb A1c MFr Bld: 12.3 % — ABNORMAL HIGH (ref 4.8–5.6)
Mean Plasma Glucose: 306 mg/dL

## 2016-08-27 LAB — GLUCOSE, CAPILLARY: Glucose-Capillary: 379 mg/dL — ABNORMAL HIGH (ref 65–99)

## 2016-08-27 MED ORDER — BUDESONIDE 0.5 MG/2ML IN SUSP: 0.5000 mg | Freq: Two times a day (BID) | RESPIRATORY_TRACT | 0 refills | Status: AC

## 2016-08-27 MED ORDER — PREDNISONE 20 MG PO TABS: 40.0000 mg | ORAL_TABLET | Freq: Every day | ORAL | 0 refills | Status: DC

## 2016-08-27 MED ORDER — INSULIN GLARGINE 100 UNIT/ML ~~LOC~~ SOLN
12.0000 [IU] | Freq: Every day | SUBCUTANEOUS | Status: DC
  Administered 2016-08-27: 12 [IU] via SUBCUTANEOUS
  Filled 2016-08-27: qty 0.12

## 2016-08-27 NOTE — Progress Notes (Signed)
Dr. Darvin Neighbours ordered discharge home. Discharge instructions, prescriptions and instructions for follow up appointment was reviewed with patient. She states that she already has an appointment with Dr. Raul Del on Tuesday August 29, 2016. Ms. Charlotte Cross was encouraged to keep that appointment.  Questions were encouraged. Will call for a wheelchair for discharge when she is dressed and her transport home has arrived.

## 2016-08-27 NOTE — Discharge Instructions (Signed)
Resume diet and activity as before  Oxygen 2 liters/min

## 2016-08-28 LAB — CULTURE, RESPIRATORY

## 2016-08-28 LAB — CULTURE, RESPIRATORY W GRAM STAIN

## 2016-08-29 ENCOUNTER — Ambulatory Visit: Admission: RE | Admit: 2016-08-29 | Payer: Medicare Other | Source: Ambulatory Visit

## 2016-08-30 ENCOUNTER — Emergency Department: Payer: Medicare Other

## 2016-08-30 ENCOUNTER — Inpatient Hospital Stay
Admission: EM | Admit: 2016-08-30 | Discharge: 2016-09-03 | DRG: 193 | Disposition: A | Discharge status: Home / Self Care | Payer: Medicare Other | Attending: Internal Medicine | Admitting: Internal Medicine

## 2016-08-30 DIAGNOSIS — J8410 Pulmonary fibrosis, unspecified: Secondary | ICD-10-CM | POA: Diagnosis present

## 2016-08-30 DIAGNOSIS — I10 Essential (primary) hypertension: Secondary | ICD-10-CM | POA: Diagnosis present

## 2016-08-30 DIAGNOSIS — Z7951 Long term (current) use of inhaled steroids: Secondary | ICD-10-CM | POA: Diagnosis not present

## 2016-08-30 DIAGNOSIS — J189 Pneumonia, unspecified organism: Principal | ICD-10-CM | POA: Diagnosis present

## 2016-08-30 DIAGNOSIS — Z79899 Other long term (current) drug therapy: Secondary | ICD-10-CM

## 2016-08-30 DIAGNOSIS — J9621 Acute and chronic respiratory failure with hypoxia: Secondary | ICD-10-CM | POA: Diagnosis present

## 2016-08-30 DIAGNOSIS — Z7982 Long term (current) use of aspirin: Secondary | ICD-10-CM

## 2016-08-30 DIAGNOSIS — Z7984 Long term (current) use of oral hypoglycemic drugs: Secondary | ICD-10-CM

## 2016-08-30 DIAGNOSIS — J181 Lobar pneumonia, unspecified organism: Secondary | ICD-10-CM

## 2016-08-30 DIAGNOSIS — E785 Hyperlipidemia, unspecified: Secondary | ICD-10-CM | POA: Diagnosis present

## 2016-08-30 DIAGNOSIS — R0602 Shortness of breath: Secondary | ICD-10-CM | POA: Diagnosis present

## 2016-08-30 DIAGNOSIS — Z9981 Dependence on supplemental oxygen: Secondary | ICD-10-CM

## 2016-08-30 DIAGNOSIS — D860 Sarcoidosis of lung: Secondary | ICD-10-CM | POA: Diagnosis present

## 2016-08-30 DIAGNOSIS — Z87891 Personal history of nicotine dependence: Secondary | ICD-10-CM | POA: Diagnosis not present

## 2016-08-30 DIAGNOSIS — J441 Chronic obstructive pulmonary disease with (acute) exacerbation: Secondary | ICD-10-CM | POA: Diagnosis present

## 2016-08-30 DIAGNOSIS — Z833 Family history of diabetes mellitus: Secondary | ICD-10-CM | POA: Diagnosis not present

## 2016-08-30 DIAGNOSIS — E119 Type 2 diabetes mellitus without complications: Secondary | ICD-10-CM | POA: Diagnosis present

## 2016-08-30 DIAGNOSIS — Y95 Nosocomial condition: Secondary | ICD-10-CM | POA: Diagnosis present

## 2016-08-30 DIAGNOSIS — J440 Chronic obstructive pulmonary disease with acute lower respiratory infection: Secondary | ICD-10-CM | POA: Diagnosis present

## 2016-08-30 LAB — GLUCOSE, CAPILLARY
GLUCOSE-CAPILLARY: 266 mg/dL — AB (ref 65–99)
GLUCOSE-CAPILLARY: 435 mg/dL — AB (ref 65–99)
GLUCOSE-CAPILLARY: 442 mg/dL — AB (ref 65–99)
GLUCOSE-CAPILLARY: 394 mg/dL — AB (ref 65–99)
Glucose-Capillary: 418 mg/dL — ABNORMAL HIGH (ref 65–99)
Glucose-Capillary: 311 mg/dL — ABNORMAL HIGH (ref 65–99)

## 2016-08-30 LAB — COMPREHENSIVE METABOLIC PANEL
ALBUMIN: 3.4 g/dL — AB (ref 3.5–5.0)
ALT: 9 U/L — AB (ref 14–54)
AST: 13 U/L — AB (ref 15–41)
Alkaline Phosphatase: 77 U/L (ref 38–126)
Anion gap: 8 (ref 5–15)
BUN: 25 mg/dL — AB (ref 6–20)
CHLORIDE: 94 mmol/L — AB (ref 101–111)
CO2: 33 mmol/L — AB (ref 22–32)
Calcium: 8.9 mg/dL (ref 8.9–10.3)
Creatinine, Ser: 0.85 mg/dL (ref 0.44–1.00)
GFR calc Af Amer: >60 mL/min (ref >60)
GLUCOSE: 168 mg/dL — AB (ref 65–99)
POTASSIUM: 4.1 mmol/L (ref 3.5–5.1)
SODIUM: 135 mmol/L (ref 135–145)
Total Bilirubin: 0.5 mg/dL (ref 0.3–1.2)
Total Protein: 8 g/dL (ref 6.5–8.1)

## 2016-08-30 LAB — CULTURE, BLOOD (ROUTINE X 2)
CULTURE: NO GROWTH
Culture: NO GROWTH
Special Requests: ADEQUATE
Special Requests: ADEQUATE

## 2016-08-30 LAB — URINALYSIS, COMPLETE (UACMP) WITH MICROSCOPIC
Bilirubin Urine: NEGATIVE
Glucose, UA: >=500 mg/dL — AB
Hgb urine dipstick: NEGATIVE
Ketones, ur: 5 mg/dL — AB
Leukocytes, UA: NEGATIVE
NITRITE: NEGATIVE
PH: 6 (ref 5.0–8.0)
Protein, ur: NEGATIVE mg/dL
SPECIFIC GRAVITY, URINE: 1.02 (ref 1.005–1.030)

## 2016-08-30 LAB — CBC WITH DIFFERENTIAL/PLATELET
Basophils Absolute: 0.1 10*3/uL (ref 0–0.1)
Basophils Relative: 1 %
Eosinophils Absolute: 0.6 10*3/uL (ref 0–0.7)
Eosinophils Relative: 4 %
HCT: 33.3 % — ABNORMAL LOW (ref 35.0–47.0)
Hemoglobin: 11.1 g/dL — ABNORMAL LOW (ref 12.0–16.0)
LYMPHS ABS: 2.2 10*3/uL (ref 1.0–3.6)
LYMPHS PCT: 14 %
MCH: 27.8 pg (ref 26.0–34.0)
MCHC: 33.4 g/dL (ref 32.0–36.0)
MCV: 83.2 fL (ref 80.0–100.0)
MONOS PCT: 11 %
Monocytes Absolute: 1.7 10*3/uL — ABNORMAL HIGH (ref 0.2–0.9)
Neutro Abs: 11.1 10*3/uL — ABNORMAL HIGH (ref 1.4–6.5)
Neutrophils Relative %: 70 %
PLATELETS: 431 10*3/uL (ref 150–440)
RBC: 4.01 MIL/uL (ref 3.80–5.20)
RDW: 12.6 % (ref 11.5–14.5)
WBC: 15.7 10*3/uL — AB (ref 3.6–11.0)

## 2016-08-30 LAB — MRSA PCR SCREENING: MRSA BY PCR: NEGATIVE

## 2016-08-30 LAB — LACTIC ACID, PLASMA: Lactic Acid, Venous: 1.2 mmol/L (ref 0.5–1.9)

## 2016-08-30 MED ORDER — ONDANSETRON HCL 4 MG PO TABS
4.0000 mg | ORAL_TABLET | Freq: Four times a day (QID) | ORAL | Status: DC | PRN
  Administered 2016-09-01: 08:00:00 4 mg via ORAL
  Filled 2016-08-30: qty 1

## 2016-08-30 MED ORDER — VANCOMYCIN HCL IN DEXTROSE 1-5 GM/200ML-% IV SOLN
1000.0000 mg | Freq: Three times a day (TID) | INTRAVENOUS | Status: DC
  Administered 2016-08-30 – 2016-08-31 (×4): 1000 mg via INTRAVENOUS
  Filled 2016-08-30 (×5): qty 200

## 2016-08-30 MED ORDER — METHYLPREDNISOLONE SODIUM SUCC 125 MG IJ SOLR
125.0000 mg | INTRAMUSCULAR | Status: DC
  Administered 2016-08-30: 125 mg via INTRAVENOUS
  Filled 2016-08-30: qty 2

## 2016-08-30 MED ORDER — GABAPENTIN 300 MG PO CAPS
300.0000 mg | ORAL_CAPSULE | Freq: Three times a day (TID) | ORAL | Status: DC
Start: 2016-08-30 — End: 2016-09-03
  Administered 2016-08-30 – 2016-09-03 (×13): 300 mg via ORAL
  Filled 2016-08-30 (×13): qty 1

## 2016-08-30 MED ORDER — IPRATROPIUM-ALBUTEROL 0.5-2.5 (3) MG/3ML IN SOLN
3.0000 mL | Freq: Four times a day (QID) | RESPIRATORY_TRACT | Status: DC
  Administered 2016-08-30 – 2016-09-02 (×12): 3 mL via RESPIRATORY_TRACT
  Filled 2016-08-30 (×16): qty 3

## 2016-08-30 MED ORDER — OXYCODONE-ACETAMINOPHEN 5-325 MG PO TABS
1.0000 | ORAL_TABLET | Freq: Four times a day (QID) | ORAL | Status: DC | PRN
  Administered 2016-08-30 – 2016-09-03 (×11): 1 via ORAL
  Filled 2016-08-30 (×11): qty 1

## 2016-08-30 MED ORDER — PRAVASTATIN SODIUM 40 MG PO TABS
40.0000 mg | ORAL_TABLET | Freq: Every day | ORAL | Status: DC
  Administered 2016-08-30 – 2016-09-02 (×4): 40 mg via ORAL
  Filled 2016-08-30 (×4): qty 1

## 2016-08-30 MED ORDER — ENOXAPARIN SODIUM 40 MG/0.4ML ~~LOC~~ SOLN
40.0000 mg | SUBCUTANEOUS | Status: DC
  Administered 2016-08-30 – 2016-09-02 (×4): 40 mg via SUBCUTANEOUS
  Filled 2016-08-30 (×4): qty 0.4

## 2016-08-30 MED ORDER — LISINOPRIL 10 MG PO TABS
10.0000 mg | ORAL_TABLET | Freq: Every day | ORAL | Status: DC
  Administered 2016-08-30 – 2016-09-03 (×5): 10 mg via ORAL
  Filled 2016-08-30 (×5): qty 1

## 2016-08-30 MED ORDER — METFORMIN HCL 500 MG PO TABS
1000.0000 mg | ORAL_TABLET | Freq: Two times a day (BID) | ORAL | Status: DC
  Administered 2016-08-30: 1000 mg via ORAL
  Filled 2016-08-30: qty 2

## 2016-08-30 MED ORDER — ACETAMINOPHEN 325 MG PO TABS
650.0000 mg | ORAL_TABLET | Freq: Four times a day (QID) | ORAL | Status: DC | PRN
  Administered 2016-09-01: 650 mg via ORAL
  Filled 2016-08-30: qty 2

## 2016-08-30 MED ORDER — METHYLPREDNISOLONE SODIUM SUCC 40 MG IJ SOLR
40.0000 mg | INTRAMUSCULAR | Status: DC
  Administered 2016-08-31: 40 mg via INTRAVENOUS
  Filled 2016-08-30: qty 1

## 2016-08-30 MED ORDER — CHLORHEXIDINE GLUCONATE 0.12 % MT SOLN
15.0000 mL | Freq: Two times a day (BID) | OROMUCOSAL | Status: DC
Start: 2016-08-30 — End: 2016-09-03
  Administered 2016-08-30 – 2016-09-03 (×9): 15 mL via OROMUCOSAL
  Filled 2016-08-30 (×9): qty 15

## 2016-08-30 MED ORDER — ONDANSETRON HCL 4 MG/2ML IJ SOLN: 4.0000 mg | Freq: Four times a day (QID) | INTRAMUSCULAR | Status: DC | PRN

## 2016-08-30 MED ORDER — SENNOSIDES-DOCUSATE SODIUM 8.6-50 MG PO TABS
1.0000 | ORAL_TABLET | Freq: Every evening | ORAL | Status: DC | PRN
  Administered 2016-08-31 – 2016-09-01 (×2): 1 via ORAL
  Filled 2016-08-30 (×2): qty 1

## 2016-08-30 MED ORDER — INSULIN ASPART 100 UNIT/ML ~~LOC~~ SOLN
0.0000 [IU] | Freq: Every day | SUBCUTANEOUS | Status: DC
  Administered 2016-08-30: 4 [IU] via SUBCUTANEOUS
  Administered 2016-09-01: 2 [IU] via SUBCUTANEOUS
  Administered 2016-09-02: 22:00:00 3 [IU] via SUBCUTANEOUS
  Filled 2016-08-30: qty 4
  Filled 2016-08-30: qty 3
  Filled 2016-08-30: qty 2

## 2016-08-30 MED ORDER — CYCLOBENZAPRINE HCL 10 MG PO TABS: 10.0000 mg | ORAL_TABLET | Freq: Three times a day (TID) | ORAL | Status: DC | PRN

## 2016-08-30 MED ORDER — VANCOMYCIN HCL IN DEXTROSE 1-5 GM/200ML-% IV SOLN
1000.0000 mg | Freq: Once | INTRAVENOUS | Status: AC
  Administered 2016-08-30: 1000 mg via INTRAVENOUS
  Filled 2016-08-30: qty 200

## 2016-08-30 MED ORDER — SODIUM CHLORIDE 0.9 % IV SOLN
INTRAVENOUS | Status: DC
  Administered 2016-08-30 – 2016-09-01 (×5): via INTRAVENOUS

## 2016-08-30 MED ORDER — INSULIN ASPART 100 UNIT/ML ~~LOC~~ SOLN: 0.0000 [IU] | Freq: Three times a day (TID) | SUBCUTANEOUS | Status: DC

## 2016-08-30 MED ORDER — OXYCODONE HCL 5 MG PO TABS
5.0000 mg | ORAL_TABLET | Freq: Four times a day (QID) | ORAL | Status: DC | PRN
  Administered 2016-08-31 – 2016-09-03 (×10): 5 mg via ORAL
  Filled 2016-08-30 (×10): qty 1

## 2016-08-30 MED ORDER — ENSURE ENLIVE PO LIQD
237.0000 mL | ORAL | Status: DC
  Administered 2016-08-30 – 2016-09-03 (×4): 237 mL via ORAL

## 2016-08-30 MED ORDER — GLIPIZIDE ER 2.5 MG PO TB24
2.5000 mg | ORAL_TABLET | Freq: Every day | ORAL | Status: DC
  Filled 2016-08-30: qty 1

## 2016-08-30 MED ORDER — ORAL CARE MOUTH RINSE
15.0000 mL | Freq: Two times a day (BID) | OROMUCOSAL | Status: DC
  Administered 2016-08-30 – 2016-09-02 (×6): 15 mL via OROMUCOSAL

## 2016-08-30 MED ORDER — ACETAMINOPHEN 650 MG RE SUPP: 650.0000 mg | Freq: Four times a day (QID) | RECTAL | Status: DC | PRN

## 2016-08-30 MED ORDER — INSULIN ASPART 100 UNIT/ML ~~LOC~~ SOLN
0.0000 [IU] | Freq: Three times a day (TID) | SUBCUTANEOUS | Status: DC
  Administered 2016-08-30: 17:00:00 20 [IU] via SUBCUTANEOUS
  Administered 2016-08-31: 09:00:00 11 [IU] via SUBCUTANEOUS
  Administered 2016-08-31 (×2): 20 [IU] via SUBCUTANEOUS
  Administered 2016-09-01 (×2): 4 [IU] via SUBCUTANEOUS
  Administered 2016-09-01 – 2016-09-02 (×2): 7 [IU] via SUBCUTANEOUS
  Administered 2016-09-02: 08:00:00 4 [IU] via SUBCUTANEOUS
  Administered 2016-09-03: 08:00:00 7 [IU] via SUBCUTANEOUS
  Administered 2016-09-03: 11 [IU] via SUBCUTANEOUS
  Filled 2016-08-30: qty 7
  Filled 2016-08-30: qty 20
  Filled 2016-08-30: qty 7
  Filled 2016-08-30: qty 4
  Filled 2016-08-30: qty 20
  Filled 2016-08-30: qty 7
  Filled 2016-08-30: qty 4
  Filled 2016-08-30: qty 7
  Filled 2016-08-30: qty 4
  Filled 2016-08-30: qty 7
  Filled 2016-08-30: qty 20
  Filled 2016-08-30: qty 11

## 2016-08-30 MED ORDER — DEXTROSE 5 % IV SOLN
2.0000 g | Freq: Three times a day (TID) | INTRAVENOUS | Status: DC
  Administered 2016-08-30 – 2016-09-03 (×12): 2 g via INTRAVENOUS
  Filled 2016-08-30 (×16): qty 2

## 2016-08-30 MED ORDER — INSULIN ASPART 100 UNIT/ML ~~LOC~~ SOLN: 0.0000 [IU] | Freq: Every day | SUBCUTANEOUS | Status: DC

## 2016-08-30 MED ORDER — INSULIN ASPART 100 UNIT/ML ~~LOC~~ SOLN
20.0000 [IU] | Freq: Once | SUBCUTANEOUS | Status: AC
  Administered 2016-08-30: 20 [IU] via SUBCUTANEOUS
  Filled 2016-08-30: qty 20

## 2016-08-30 MED ORDER — DEXTROSE 5 % IV SOLN
2.0000 g | Freq: Once | INTRAVENOUS | Status: AC
  Administered 2016-08-30: 2 g via INTRAVENOUS
  Filled 2016-08-30: qty 2

## 2016-08-30 NOTE — Progress Notes (Signed)
Initial Nutrition Assessment  DOCUMENTATION CODES:   Obesity unspecified  INTERVENTION:  Provide Ensure Enlive po once daily, each supplement provides 350 kcal and 20 grams of protein. Discussed with patient that as soon as her appetite is back to normal she will no longer need to drink Ensure.  Encouraged adequate intake of protein at meals to prevent further unintentional weight loss.  Recommend liberalizing diet from Heart Healthy/Carbohydrate Modified to just Carbohydrate Modified.  NUTRITION DIAGNOSIS:   Inadequate oral intake related to poor appetite, other (see comment) (difficulty breathing, cough) as evidenced by per patient/family report.  GOAL:   Patient will meet greater than or equal to 90% of their needs  MONITOR:   PO intake, Supplement acceptance, Labs, Weight trends, I & O's  REASON FOR ASSESSMENT:   Malnutrition Screening Tool    ASSESSMENT:   51 year old female with PMHx of DM type 2, HTN, sarcoidosis, COPD, aspergillosis who presented with difficulty breathing, productive cough and was found to have HCAP.   Spoke with patient at bedside. She reports she has had a decreased appetite for the past 2 weeks related to her cough and difficulty breathing. She is still eating some, but it is less than her usual intake. She is able to eat a small breakfast such as oatmeal or one pancake with sausage. Other meals are small such as sandwiches. Patient reports she usually eats two "good" meals and then snacks throughout the day. Patient reports she is not going to be able to eat well here because she does not like the food. Reports it does not have enough seasoning. Also reports she is lactose-intolerant.  Patient reports her UBW is actually 220 lbs. She had been gaining weight recently up to 249 lbs at a recent appointment. She reports she then lost 16 lbs (6.4% body weight) over one week, which is significant for time frame. RD obtained bed scale weight of 235.7 lbs.    Medications reviewed and include: glipizide 2.5 mg daily with breakfast, Novolog 0-9 units TID, Novolog 0-5 units QHS, metformin 1000 mg BID, methylprednisolone 125 mg Q24hrs, NS @ 75 ml/hr, cefepime, vancomycin.  Labs reviewed: CBG 266, Chloride 94, CO2 33, BUN 25.   Nutrition-Focused physical exam completed. Findings are no fat depletion, no muscle depletion, and no edema.   Patient does not meet criteria for malnutrition at this time.  Diet Order:  Diet heart healthy/carb modified Room service appropriate? Yes; Fluid consistency: Thin  Skin:  Reviewed, no issues  Last BM:  PTA (08/27/2016 per chart)  Height:   Ht Readings from Last 1 Encounters:  08/30/16 _0  (1.803 m)    Weight:   Wt Readings from Last 1 Encounters:  08/30/16 235 lb 11.2 oz (106.9 kg)    Ideal Body Weight:  70.5 kg  BMI:  Body mass index is 32.87 kg/m.  Estimated Nutritional Needs:   Kcal:  2725-3664 (MSJ x 1.1-1.3)  Protein:  95-115 grams (0.9-1.1 grams/kg)  Fluid:  2.1 L/day (30 ml/kg IBW)  EDUCATION NEEDS:   No education needs identified at this time  Willey Blade, MS, RD, LDN Pager: [phone] After Hours Pager: [phone]

## 2016-08-30 NOTE — Progress Notes (Signed)
Dr Benjie Karvonen notified that patients blood sugar was 442 and 435. MD acknowledged and ordered 20 units novolog once.

## 2016-08-30 NOTE — ED Notes (Signed)
Patient transported to X-ray

## 2016-08-30 NOTE — ED Provider Notes (Signed)
[facility] Emergency Department Provider Note   First MD Initiated Contact with Patient 08/30/16 [phone]     (approximate)  I have reviewed the triage vital signs and the nursing notes.   HISTORY  Chief Complaint Shortness of Breath and Chest Pain   HPI Charlotte Cross is a 51 y.o. female bolus of chronic medical conditions including pulmonary sarcoidosis, pulmonary aspergillosis 2 and recent diagnosis of pneumonia presents to the emergency department with 2 week history of progressive dyspnea productive cough and chest discomfort. Patient states that she was seen by her pulmonologist diagnosed with bronchitis and then subsequently CT scan of the chest was performed on Friday which was consistent with pneumonia. Patient states that she's been taking Levaquin as prescribed as well as prednisone with progression of symptoms.   Past Medical History:  Diagnosis Date  . Aspergillosis ([place])   . COPD (chronic obstructive pulmonary disease) ([place])   . Diabetes mellitus without complication ([place])   . Hypertension   . Sarcoidosis     Patient Active Problem List   Diagnosis Date Noted  . Pneumonia 08/30/2016  . PNA (pneumonia) 08/25/2016  . Hypoglycemia 12/24/2014  . Hyperkalemia 12/24/2014  . Hemoptysis 11/15/2014    Past Surgical History:  Procedure Laterality Date  . LUNG REMOVAL, PARTIAL     left    Prior to Admission medications   Medication Sig Start Date End Date Taking? Authorizing Provider  albuterol (PROVENTIL HFA;VENTOLIN HFA) 108 (90 BASE) MCG/ACT inhaler Inhale 2 puffs into the lungs every 6 (six) hours as needed for wheezing or shortness of breath.   Yes [provider]  albuterol (PROVENTIL) (2.5 MG/3ML) 0.083% nebulizer solution Inhale 2.5 mg into the lungs every 6 (six) hours as needed for wheezing or shortness of breath.    Yes [provider]  aspirin EC 325 MG tablet Take 325 mg by mouth daily.   Yes [provider]  budesonide (PULMICORT) 0.5 MG/2ML nebulizer solution Inhale 2 mLs (0.5 mg total) into the lungs every 12 (twelve) hours. 08/27/16  Yes Sudini, Alveta Heimlich, MD  cyclobenzaprine (FLEXERIL) 10 MG tablet Take 10 mg by mouth 3 (three) times daily as needed for muscle spasms.    Yes [provider]  gabapentin (NEURONTIN) 300 MG capsule Take 300 mg by mouth 3 (three) times daily.   Yes [provider]  hydrochlorothiazide (HYDRODIURIL) 25 MG tablet Take 25 mg by mouth daily.   Yes [provider]  levofloxacin (LEVAQUIN) 500 MG tablet Take 500 mg by mouth daily. 08/23/16  Yes [provider]  lisinopril (PRINIVIL,ZESTRIL) 10 MG tablet Take 10 mg by mouth daily.    Yes [provider]  metFORMIN (GLUCOPHAGE) 1000 MG tablet Take 1,000 mg by mouth 2 (two) times daily with a meal.   Yes [provider]  pravastatin (PRAVACHOL) 40 MG tablet Take 40 mg by mouth at bedtime.   Yes [provider]  predniSONE (DELTASONE) 20 MG tablet Take 2 tablets (40 mg total) by mouth daily with breakfast. 08/27/16  Yes Hillary Bow, MD    Allergies No known drug allergies  Family History  Problem Relation Age of Onset  . Diabetes Mother   . Diabetes Father   . Diabetes Brother     Social History Social History  Substance Use Topics  . Smoking status: Never Smoker  . Smokeless tobacco: Never Used  . Alcohol use No    Review of Systems Constitutional: No fever/chills Eyes: No visual  changes. ENT: No sore throat. Cardiovascular: Positive for chest pain. Respiratory: Positive for shortness of breath. Gastrointestinal: No abdominal pain.  No nausea, no vomiting.  No diarrhea.  No constipation. Genitourinary: Negative for dysuria. Musculoskeletal: Negative for neck pain.  Negative for back pain. Integumentary: Negative for rash. Neurological: Negative for headaches, focal weakness or  numbness.  ____________________________________________   PHYSICAL EXAM:  VITAL SIGNS: ED Triage Vitals  Enc Vitals Group     BP 08/30/16 0447 139/83     Pulse Rate 08/30/16 0447 95     Resp 08/30/16 0447 20     Temp 08/30/16 0447 97.8 F (36.6 C)     Temp Source 08/30/16 0447 Oral     SpO2 08/30/16 0447 (!) 83 %     Weight 08/30/16 0448 104.3 kg (230 lb)     Height 08/30/16 0448 1.803 m (_0 )     Head Circumference --      Peak Flow --      Pain Score 08/30/16 0447 10     Pain Loc --      Pain Edu? --      Excl. in [place]? --     Constitutional: Alert and oriented. Ill-appearing  Eyes: Conjunctivae are normal.  Head: Atraumatic. Mouth/Throat: Mucous membranes are moist.  Oropharynx non-erythematous. Neck: No stridor.  Cardiovascular: Normal rate, regular rhythm. Good peripheral circulation. Grossly normal heart sounds. Respiratory: Normal respiratory effort.  No retractions. Diffuse rhonchi Gastrointestinal: Soft and nontender. No distention.  Musculoskeletal: No lower extremity tenderness nor edema. No gross deformities of extremities. Neurologic:  Normal speech and language. No gross focal neurologic deficits are appreciated.  Skin:  Skin is warm, dry and intact. No rash noted. Psychiatric: Mood and affect are normal. Speech and behavior are normal.  ____________________________________________   LABS (all labs ordered are listed, but only abnormal results are displayed)  Labs Reviewed  COMPREHENSIVE METABOLIC PANEL - Abnormal; Notable for the following:       Result Value   Chloride 94 (*)    CO2 33 (*)    Glucose, Bld 168 (*)    BUN 25 (*)    Albumin 3.4 (*)    AST 13 (*)    ALT 9 (*)    All other components within normal limits  CBC WITH DIFFERENTIAL/PLATELET - Abnormal; Notable for the following:    WBC 15.7 (*)    Hemoglobin 11.1 (*)    HCT 33.3 (*)    Neutro Abs 11.1 (*)    Monocytes Absolute 1.7 (*)    All other components within normal limits   CULTURE, BLOOD (ROUTINE X 2)  CULTURE, BLOOD (ROUTINE X 2)  LACTIC ACID, PLASMA  URINALYSIS, COMPLETE (UACMP) WITH MICROSCOPIC   ____________________________________________  EKG  ED ECG REPORT I, [place] N BROWN, the attending physician, personally viewed and interpreted this ECG.   Date: 08/30/2016  EKG Time: 4:53AM  Rate: 98  Rhythm: Normal sinus rhythm  Axis: Normal  Intervals: Normal  ST&T Change: None  ____________________________________________  RADIOLOGY I, [place] N BROWN, personally viewed and evaluated these images (plain radiographs) as part of my medical decision making, as well as reviewing the written report by the radiologist.  Dg Chest 2 View  Result Date: 08/30/2016 CLINICAL DATA:  Subacute onset of cough, shortness of breath and generalized chest pain. Initial encounter. EXAM: CHEST  2 VIEW COMPARISON:  Chest radiograph performed 12/24/2014, and CT of the chest performed 08/25/2016 FINDINGS: Underlying changes of sarcoidosis are again noted. There  is perhaps somewhat increased airspace opacity at the left midlung zone, which could reflect superimposed pneumonia. There is chronic elevation of the left hemidiaphragm. Underlying bilateral scarring is noted. No pleural effusion or pneumothorax is seen. The cardiomediastinal silhouette is normal in size. No acute osseous abnormalities are identified. IMPRESSION: 1. Somewhat increased airspace opacity at the left midlung zone, which could reflect superimposed pneumonia. 2. Underlying changes of pulmonary sarcoidosis again noted. Chronic elevation of the left hemidiaphragm, and bilateral scarring. Electronically Signed   By: Garald Balding M.D.   On: 08/30/2016 05:48     Procedures   ____________________________________________   INITIAL IMPRESSION / ASSESSMENT AND PLAN / ED COURSE  Pertinent labs & imaging results that were available during my care of the patient were reviewed by me and considered in my  medical decision making (see chart for details).  51 year old female presenting with history of physical exam consistent with failed outpatient management of pneumonia. Patient given IV vancomycin and cefepime in the emergency department. Patient discussed with Dr. Estanislado Pandy for hospital admission for further management.      ____________________________________________  FINAL CLINICAL IMPRESSION(S) / ED DIAGNOSES  Final diagnoses:  Community acquired pneumonia of left upper lobe of lung ([place])     MEDICATIONS GIVEN DURING THIS VISIT:  Medications  vancomycin (VANCOCIN) IVPB 1000 mg/200 mL premix (1,000 mg Intravenous New Bag/Given 08/30/16 0606)  methylPREDNISolone sodium succinate (SOLU-MEDROL) 125 mg/2 mL injection 125 mg (125 mg Intravenous Given 08/30/16 0611)  ipratropium-albuterol (DUONEB) 0.5-2.5 (3) MG/3ML nebulizer solution 3 mL (not administered)  ceFEPIme (MAXIPIME) 2 g in dextrose 5 % 50 mL IVPB (not administered)  vancomycin (VANCOCIN) IVPB 1000 mg/200 mL premix (not administered)  ceFEPIme (MAXIPIME) 2 g in dextrose 5 % 50 mL IVPB (0 g Intravenous Stopped 08/30/16 0553)     NEW OUTPATIENT MEDICATIONS STARTED DURING THIS VISIT:  New Prescriptions   No medications on file    Modified Medications   No medications on file    Discontinued Medications   GLIPIZIDE (GLIPIZIDE XL) 2.5 MG 24 HR TABLET    Take 1 tablet (2.5 mg total) by mouth daily with breakfast.     Note:  This document was prepared using Dragon voice recognition software and may include unintentional dictation errors.    Gregor Hams, MD 08/30/16 [phone]

## 2016-08-30 NOTE — H&P (Signed)
[facility] [place] NAME: Verlin Duke    MR#:  [id number]  DATE OF BIRTH:  02/03/1966  DATE OF ADMISSION:  08/30/2016  PRIMARY CARE PHYSICIAN: Richrd Humbles, MD   REQUESTING/REFERRING PHYSICIAN:   CHIEF COMPLAINT:   Chief Complaint  Patient presents with  . Shortness of Breath  . Chest Pain    HISTORY OF PRESENT ILLNESS: Aneesah Hernan  is a 51 y.o. female with a known history of Sarcoidosis, hypertension, type 2 diabetes mellitus, COPD, aspergillosis presented to the emergency room with difficulty breathing since last Wednesday. Patient has shortness of breath and cough which has worsened for the last few days. Cough is productive of phlegm. She was seen last Wednesday by pulmonologist and was given oral Levaquin antibiotic for bronchitis which worsened and she was admitted for pneumonia last Friday at our hospital and treated with antibiotics and discharged on oral Levaquin on Sunday. Patient again comes back to emergency room today with cough and shortness of breath. She uses home oxygen. X-ray of the chest showed left lung pneumonia and she was started on IV vancomycin and IV cefepime antibiotics emergency room. Patient was treated for aspergillosis in the past. Has chest discomfort whenever she coughs. Hospitalist service was consulted for further care of the patient.  PAST MEDICAL HISTORY:   Past Medical History:  Diagnosis Date  . Aspergillosis ([place])   . COPD (chronic obstructive pulmonary disease) ([place])   . Diabetes mellitus without complication ([place])   . Hypertension   . Sarcoidosis     PAST SURGICAL HISTORY: Past Surgical History:  Procedure Laterality Date  . LUNG REMOVAL, PARTIAL     left    SOCIAL HISTORY:  Social History  Substance Use Topics  . Smoking status: Never Smoker  . Smokeless tobacco: Never Used  . Alcohol use No    FAMILY HISTORY:  Family History  Problem Relation Age of Onset  .  Diabetes Mother   . Diabetes Father   . Diabetes Brother     DRUG ALLERGIES: No Known Allergies  REVIEW OF SYSTEMS:   CONSTITUTIONAL: No fever, has weakness.  EYES: No blurred or double vision.  EARS, NOSE, AND THROAT: No tinnitus or ear pain.  RESPIRATORY: Has cough, shortness of breath,  No wheezing or hemoptysis.  CARDIOVASCULAR: No chest pain, orthopnea, edema.  GASTROINTESTINAL: No nausea, vomiting, diarrhea or abdominal pain.  GENITOURINARY: No dysuria, hematuria.  ENDOCRINE: No polyuria, nocturia,  HEMATOLOGY: No anemia, easy bruising or bleeding SKIN: No rash or lesion. MUSCULOSKELETAL: No joint pain or arthritis.   NEUROLOGIC: No tingling, numbness, weakness.  PSYCHIATRY: No anxiety or depression.   MEDICATIONS AT HOME:  Prior to Admission medications   Medication Sig Start Date End Date Taking? Authorizing Provider  albuterol (PROVENTIL HFA;VENTOLIN HFA) 108 (90 BASE) MCG/ACT inhaler Inhale 2 puffs into the lungs every 6 (six) hours as needed for wheezing or shortness of breath.   Yes [provider]  albuterol (PROVENTIL) (2.5 MG/3ML) 0.083% nebulizer solution Inhale 2.5 mg into the lungs every 6 (six) hours as needed for wheezing or shortness of breath.    Yes [provider]  aspirin EC 325 MG tablet Take 325 mg by mouth daily.   Yes [provider]  budesonide (PULMICORT) 0.5 MG/2ML nebulizer solution Inhale 2 mLs (0.5 mg total) into the lungs every 12 (twelve) hours. 08/27/16  Yes Sudini, Alveta Heimlich, MD  cyclobenzaprine (FLEXERIL) 10 MG tablet Take 10 mg by mouth  3 (three) times daily as needed for muscle spasms.    Yes [provider]  gabapentin (NEURONTIN) 300 MG capsule Take 300 mg by mouth 3 (three) times daily.   Yes [provider]  hydrochlorothiazide (HYDRODIURIL) 25 MG tablet Take 25 mg by mouth daily.   Yes [provider]  levofloxacin (LEVAQUIN) 500 MG tablet Take 500 mg by mouth daily. 08/23/16  Yes  [provider]  lisinopril (PRINIVIL,ZESTRIL) 10 MG tablet Take 10 mg by mouth daily.    Yes [provider]  metFORMIN (GLUCOPHAGE) 1000 MG tablet Take 1,000 mg by mouth 2 (two) times daily with a meal.   Yes [provider]  pravastatin (PRAVACHOL) 40 MG tablet Take 40 mg by mouth at bedtime.   Yes [provider]  predniSONE (DELTASONE) 20 MG tablet Take 2 tablets (40 mg total) by mouth daily with breakfast. 08/27/16  Yes Sudini, Srikar, MD      PHYSICAL EXAMINATION:   VITAL SIGNS: Blood pressure 127/80, pulse 89, temperature 97.8 F (36.6 C), temperature source Oral, resp. rate (!) 21, height _0  (1.803 m), weight 104.3 kg (230 lb), last menstrual period 11/24/2014, SpO2 100 %.  GENERAL:  51 y.o.-year-old patient lying in the bed with no acute distress.  EYES: Pupils equal, round, reactive to light and accommodation. No scleral icterus. Extraocular muscles intact.  HEENT: Head atraumatic, normocephalic. Oropharynx and nasopharynx clear.  NECK:  Supple, no jugular venous distention. No thyroid enlargement, no tenderness.  LUNGS: Decreased breath sounds bilaterally, no wheezing, rales heard in left lung. No use of accessory muscles of respiration.  CARDIOVASCULAR: S1, S2 normal. No murmurs, rubs, or gallops.  ABDOMEN: Soft, nontender, nondistended. Bowel sounds present. No organomegaly or mass.  EXTREMITIES: No pedal edema, cyanosis, or clubbing.  NEUROLOGIC: Cranial nerves II through XII are intact. Muscle strength 5/5 in all extremities. Sensation intact. Gait not checked.  PSYCHIATRIC: The patient is alert and oriented x 3.  SKIN: No obvious rash, lesion, or ulcer.   LABORATORY PANEL:   CBC  Recent Labs Lab 08/25/16 1131 08/26/16 0523 08/30/16 0505  WBC 12.8* 10.2 15.7*  HGB 10.9* 10.9* 11.1*  HCT 33.0* 32.5* 33.3*  PLT 356 286 431  MCV 82.0 83.9 83.2  MCH 27.2 28.1 27.8  MCHC 33.1 33.5 33.4  RDW 12.8 12.5 12.6  LYMPHSABS 1.4 1.3  2.2  MONOABS 2.0* 1.5* 1.7*  EOSABS 0.4 0.5 0.6  BASOSABS 0.1 0.1 0.1   ------------------------------------------------------------------------------------------------------------------  Chemistries   Recent Labs Lab 08/25/16 1131 08/26/16 0523 08/30/16 0505  NA 128* 135 135  K 5.8* 4.5 4.1  CL 89* 97* 94*  CO2 30 30 33*  GLUCOSE 252* 208* 168*  BUN 20 15 25*  CREATININE 1.69* 1.19* 0.85  CALCIUM 9.3 9.0 8.9  AST 17  --  13*  ALT 14  --  9*  ALKPHOS 92  --  77  BILITOT 0.7  --  0.5   ------------------------------------------------------------------------------------------------------------------ estimated creatinine clearance is 101.7 mL/min (by C-G formula based on SCr of 0.85 mg/dL). ------------------------------------------------------------------------------------------------------------------ No results for input(s): TSH, T4TOTAL, T3FREE, THYROIDAB in the last 72 hours.  Invalid input(s): FREET3   Coagulation profile No results for input(s): INR, PROTIME in the last 168 hours. ------------------------------------------------------------------------------------------------------------------- No results for input(s): DDIMER in the last 72 hours. -------------------------------------------------------------------------------------------------------------------  Cardiac Enzymes  Recent Labs Lab 08/25/16 1131  TROPONINI <0.03   ------------------------------------------------------------------------------------------------------------------ Invalid input(s): POCBNP  ---------------------------------------------------------------------------------------------------------------  Urinalysis    Component Value Date/Time   COLORURINE  YELLOW (A) 12/24/2014 1715   APPEARANCEUR CLEAR (A) 12/24/2014 1715   LABSPEC 1.013 12/24/2014 1715   PHURINE 5.0 12/24/2014 1715   GLUCOSEU 150 (A) 12/24/2014 1715   HGBUR 1+ (A) 12/24/2014 1715   BILIRUBINUR NEGATIVE  12/24/2014 1715   KETONESUR NEGATIVE 12/24/2014 1715   PROTEINUR NEGATIVE 12/24/2014 1715   NITRITE NEGATIVE 12/24/2014 1715   LEUKOCYTESUR NEGATIVE 12/24/2014 1715     RADIOLOGY: Dg Chest 2 View  Result Date: 08/30/2016 CLINICAL DATA:  Subacute onset of cough, shortness of breath and generalized chest pain. Initial encounter. EXAM: CHEST  2 VIEW COMPARISON:  Chest radiograph performed 12/24/2014, and CT of the chest performed 08/25/2016 FINDINGS: Underlying changes of sarcoidosis are again noted. There is perhaps somewhat increased airspace opacity at the left midlung zone, which could reflect superimposed pneumonia. There is chronic elevation of the left hemidiaphragm. Underlying bilateral scarring is noted. No pleural effusion or pneumothorax is seen. The cardiomediastinal silhouette is normal in size. No acute osseous abnormalities are identified. IMPRESSION: 1. Somewhat increased airspace opacity at the left midlung zone, which could reflect superimposed pneumonia. 2. Underlying changes of pulmonary sarcoidosis again noted. Chronic elevation of the left hemidiaphragm, and bilateral scarring. Electronically Signed   By: Garald Balding M.D.   On: 08/30/2016 05:48    EKG: Orders placed or performed during the hospital encounter of 08/30/16  . EKG 12-Lead  . EKG 12-Lead  . ED EKG 12-Lead  . ED EKG 12-Lead    IMPRESSION AND PLAN: 51 year old female patient with history of sarcoidosis, hypertension, aspergillosis, COPD, type 2 diabetes mellitus presented to the emergency room with shortness of breath and cough. Admitting diagnosis 1. Healthcare associated pneumonia 2. Dyspnea 3. Sarcoidosis 4. Emphysema 5. Type 2 diabetes mellitus Treatment plan Mid patient to medical floor Start patient on IV vancomycin and IV cefepime antibiotics Nebulization treatments around-the-clock Pulmonary consultation Oxygen via nasal cannula IV Solu-Medrol 125 daily Supportive care  All the records  are reviewed and case discussed with ED provider. Management plans discussed with the patient, family and they are in agreement.  CODE STATUS:FULL CODE Code Status History    Date Active Date Inactive Code Status Order ID Comments User Context   08/25/2016  5:10 PM 08/27/2016  2:28 PM Full Code [id number]  Nicholes Mango, MD Inpatient   12/24/2014  8:55 PM 12/26/2014  5:19 PM Full Code [id number]  Vaughan Basta, MD Inpatient       TOTAL TIME TAKING CARE OF THIS PATIENT: 52 minutes.    Saundra Shelling M.D on 08/30/2016 at 6:26 AM  Between 7am to 6pm - Pager - [phone]  After 6pm go to [email or web address] - password [email or web address] [facility]  Office  [phone]  CC: Primary care physician; Richrd Humbles, MD

## 2016-08-30 NOTE — Progress Notes (Signed)
Date: 08/30/2016,   MRN# [id number] Charlotte Cross Jan 30, 1966 Code Status:     Code Status Orders        Start     Ordered   08/30/16 0726  Full code  Continuous     08/30/16 0725    Code Status History    Date Active Date Inactive Code Status Order ID Comments User Context   08/25/2016  5:10 PM 08/27/2016  2:28 PM Full Code [id number]  Nicholes Mango, MD Inpatient   12/24/2014  8:55 PM 12/26/2014  5:19 PM Full Code [id number]  Vaughan Basta, MD Inpatient     Hosp day:_0 @ Referring MD: _1 @       CC: left pneumonia.  HPI: This is a 51 yr old pleasant af am lady whom I have recently inherited. She is known to have sarcoidosis. Pulmonary fibrosis, obesity, suspect sleep apnea, sleep study ordered, on oxygen. 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 Her with left persistent pneumonia. She was here 08/26/15 back with fever, sob, cough and left infiltrate. No hx of aspiration. Prior to 08/25/16 she was treated for bronchitis with levaquin, she was d/c last week from [facility] on levaquin. Presently on vancomycin and cepexime. She denies hemoptysis, rashes, pleurisy or calf pain.   PMHX:   Past Medical History:  Diagnosis Date  . Aspergillosis ([place])   . COPD (chronic obstructive pulmonary disease) ([place])   . Diabetes mellitus without complication ([place])   . Hypertension   . Sarcoidosis    Surgical Hx:  Past Surgical History:  Procedure Laterality Date  . LUNG REMOVAL, PARTIAL     left   Family Hx:  Family History  Problem Relation Age of Onset  . Diabetes Mother   . Diabetes Father   . Diabetes Brother    Social Hx:   Social History  Substance Use Topics  . Smoking status: Never Smoker  . Smokeless tobacco: Never Used  . Alcohol use No   Medication:    Home Medication:    Current Medication: _2 @   Allergies:  Patient has no known allergies.  Review of Systems: Gen:  Denies  fever, sweats, chills HEENT: Denies blurred  vision, double vision, ear pain, eye pain, hearing loss, nose bleeds, sore throat Cvc:  No dizziness, chest pain or heaviness Resp:    Gi: Denies swallowing difficulty, stomach pain, nausea or vomiting, diarrhea, constipation, bowel incontinence Gu:  Denies bladder incontinence, burning urine Ext:   No Joint pain, stiffness or swelling Skin: No skin rash, easy bruising or bleeding or hives Endoc:  No polyuria, polydipsia , polyphagia or weight change Psych: No depression, insomnia or hallucinations  Other:  All other systems negative  Physical Examination:   VS: BP 133/75 (BP Location: Right Arm)   Pulse 99   Temp 97.9 F (36.6 C) (Oral)   Resp 18   Ht _3  (1.803 m)   Wt 235 lb 9.6 oz (106.9 kg)   LMP 11/24/2014   SpO2 99%   BMI 32.86 kg/m   General Appearance: No distress  Neuro: without focal findings, mental status, speech normal, alert and oriented, cranial nerves 2-12 intact, reflexes normal and symmetric, sensation grossly normal  HEENT: PERRLA, EOM intact, no ptosis, no other lesions noticed, Mallampati: Pulmonary:.No wheezing, No rales  Sputum Production:   Cardiovascular:  Normal S1,S2.  No m/r/g.  Abdominal aorta pulsation normal.    Abdomen:Benign, Soft, non-tender, No masses, hepatosplenomegaly, No lymphadenopathy Endoc: No evident thyromegaly, no signs of  acromegaly or Cushing features Skin:   warm, no rashes, no ecchymosis  Extremities: normal, no cyanosis, clubbing, no edema, warm with normal capillary refill. Other findings:   Labs results:   Recent Labs     08/30/16  0505  HGB  11.1*  HCT  33.3*  MCV  83.2  WBC  15.7*  BUN  25*  CREATININE  0.85  GLUCOSE  168*  CALCIUM  8.9  ,     Culture results:     HIV-1 P24 Antigen - HIV24 NON REACTIVE NON REACTIVE   HIV 1/2 Antibodies NON REACTIVE NON REACTIVE   Interpretation (HIV Ag Ab)     Strep Pneumo Urinary Antigen NEGATIVE NEGATIVE   Comment:        Rad results:   Dg Chest 2  View  Result Date: 08/30/2016 CLINICAL DATA:  Subacute onset of cough, shortness of breath and generalized chest pain. Initial encounter. EXAM: CHEST  2 VIEW COMPARISON:  Chest radiograph performed 12/24/2014, and CT of the chest performed 08/25/2016 FINDINGS: Underlying changes of sarcoidosis are again noted. There is perhaps somewhat increased airspace opacity at the left midlung zone, which could reflect superimposed pneumonia. There is chronic elevation of the left hemidiaphragm. Underlying bilateral scarring is noted. No pleural effusion or pneumothorax is seen. The cardiomediastinal silhouette is normal in size. No acute osseous abnormalities are identified. IMPRESSION: 1. Somewhat increased airspace opacity at the left midlung zone, which could reflect superimposed pneumonia. 2. Underlying changes of pulmonary sarcoidosis again noted. Chronic elevation of the left hemidiaphragm, and bilateral scarring. Electronically Signed   By: Garald Balding M.D.   On: 08/30/2016 05:48        Assessment and Plan: Left mid chest infiltrate, c/w pneumonia, doubt fungal. Agree with vanco, cefepime Sputum c/s, fungal Aspergillosis galactomannan  Sarcoidosis with background fibrosis -ace level  Copd, ex smoker -nebs -oxygen -dvt prophylaxis  Elevated left diaphragm post LLL resection. ? Paralyzed -consider sniff test  Suspect sleep apnea -out patient sleep study ordered  I have personally obtained a history, examined the patient, evaluated laboratory and imaging results, formulated the assessment and plan and placed orders.  The Patient requires high complexity decision making for assessment and support, frequent evaluation and titration of therapies, application of advanced monitoring technologies and extensive interpretation of multiple databases.   Herbon Fleming,M.D. Pulmonary & Critical care Medicine [facility]

## 2016-08-30 NOTE — ED Notes (Signed)
ED EKG handed to [place].

## 2016-08-30 NOTE — Progress Notes (Signed)
Patient seen and examined. Patient with pneumonia. Continue cefepime and vancomycin. If MRSA PCR is negative then discontinue vancomycin. Agree with pulmonary consult Agree with plan as outlined by admitting M.D.

## 2016-08-30 NOTE — Progress Notes (Signed)
Inpatient Diabetes Program Recommendations  AACE/ADA: New Consensus Statement on Inpatient Glycemic Control (2015)  Target Ranges:  Prepandial:   less than 140 mg/dL      Peak postprandial:   less than 180 mg/dL (1-2 hours)      Critically ill patients:  140 - 180 mg/dL   Results for Charlotte Cross, Charlotte Cross (MRN [id number]) as of 08/30/2016 13:43  Ref. Range 08/26/2016 05:23  Hemoglobin A1C Latest Ref Range: 4.8 - 5.6 % 12.3 (H)   Results for Charlotte Cross, Charlotte Cross (MRN [id number]) as of 08/30/2016 13:43  Ref. Range 08/30/2016 08:05 08/30/2016 11:48 08/30/2016 12:11  Glucose-Capillary Latest Ref Range: 65 - 99 mg/dL 266 (H) 442 (H) 435 (H)    Admit with: SOB  History: DM, Sarcoidosis, COPD  Home DM Meds: Metformin 1000 mg BID  Current Insulin Orders: Novolog Resistant Correction Scale/ SSI (0-20 units) TID AC + HS     Spoke with patient by phone today (DM Coordinator not physically present on [place] [facility] today).  Patient told me her PCP stopped the Glipizide she was taking over two years ago.  Has only been taking Metformin 1000 mg BID for the last 2 years.  Patient told me she took insulin over 10 years ago but had to stop b/c she left her PCP office and could never get any refills.  Not sure if patient has been going regularly to PCP appointments for DM management?  Based on her current A1c of 12.3%, it appears she has not been receiving regular follow-up care for blood sugar management.  Patient was unsure of the name of the insulin she took over 10 years ago.  Per patient, her CBG meter broke and she just received a new meter 3 days ago.  Has been checking 3-5 times per day but told me the highest CBG reading she has seen was just over 200 mg/dl.    Spoke with patient about her elevated A1c of 12.3%.  Explained what an A1c is and what it measures.  Reminded patient that her goal A1c is 7% or less per ADA standards to prevent both acute and long-term complications.  Explained to patient the extreme  importance of good glucose control at home.  Encouraged patient to check her CBGs at least bid at home (fasting and another check within the day) and to record all CBGs in a logbook for her PCP to review.  Also reviewed CBG goals for home (80-130 mg/dl pre- meal and <180 mg/dl post-meal).    MD- Note patient's CBGs extremely elevated today likely due (in part) to steroids.  Note patient received 125 mg Solumedrol early this AM and will start on Solumedrol 40 mg daily tomorrow.  Note Novolog SSI increased to Resistant scale this evening.  If you see that pt's CBG in the AM is elevated, please consider starting Lantus 15 units daily (~0.15 units/kg dosing based on weight of 107 kg)      --Will follow patient during hospitalization--  Wyn Quaker RN, MSN, CDE Diabetes Coordinator Inpatient Glycemic Control Team Team Pager: [phone] (8a-5p)

## 2016-08-30 NOTE — Progress Notes (Signed)
Pharmacy Antibiotic Note  Charlotte Cross is a 51 y.o. female admitted on 08/30/2016 with pneumonia.  Pharmacy has been consulted for vancomycin and cefepime dosing.  Plan: DW 84kg  Vd 59L kei 0.089 hr-1  T1/2 8 hours Vancomycin 1 gram q 8 hours ordered. Level before 5th dose. Goal trough 15-20.  Cefepime 2 grams q 8 hours ordered. Patient recently d/c home on abx with dx of pneumonia.  Height: _0  (180.3 cm) Weight: 230 lb (104.3 kg) IBW/kg (Calculated) : 70.8  Temp (24hrs), Avg:97.8 F (36.6 C), Min:97.8 F (36.6 C), Max:97.8 F (36.6 C)   Recent Labs Lab 08/25/16 1131 08/26/16 0523 08/30/16 0505  WBC 12.8* 10.2 15.7*  CREATININE 1.69* 1.19* 0.85  LATICACIDVEN  --   --  1.2    Estimated Creatinine Clearance: 101.7 mL/min (by C-G formula based on SCr of 0.85 mg/dL).    No Known Allergies  Antimicrobials this admission: Vancomycin cefepime 6/13  >>    >>   Dose adjustments this admission:   Microbiology results: 6/13 BCx: pending 6/8 Sputum: GPC, GPR, GN coccobacilli       6/13 UA: pending 6/13 CXR: Increased L midlung opacity Thank you for allowing pharmacy to be a part of this patient's care.  Simmie Garin S 08/30/2016 6:24 AM

## 2016-08-30 NOTE — ED Triage Notes (Addendum)
Pt presents to ED with c/o SHOB, cough, and CP x2 weeks. Pt was d/c'd on Sunday, told she had Pneumonia, and r/x'd ABX. Pt reports she is out of home O2, productive cough (tan sputum), and central/left sided CP. Pt taken immediately to Rm 17 for Triage; Dr Owens Shark at bedside upon pt's arrival to room.

## 2016-08-31 LAB — BASIC METABOLIC PANEL
ANION GAP: 6 (ref 5–15)
BUN: 21 mg/dL — ABNORMAL HIGH (ref 6–20)
CALCIUM: 8.7 mg/dL — AB (ref 8.9–10.3)
CO2: 34 mmol/L — AB (ref 22–32)
CREATININE: 0.91 mg/dL (ref 0.44–1.00)
Chloride: 97 mmol/L — ABNORMAL LOW (ref 101–111)
GFR calc Af Amer: >60 mL/min (ref >60)
GFR calc non Af Amer: >60 mL/min (ref >60)
Glucose, Bld: 112 mg/dL — ABNORMAL HIGH (ref 65–99)
Potassium: 3.9 mmol/L (ref 3.5–5.1)
Sodium: 137 mmol/L (ref 135–145)

## 2016-08-31 LAB — CBC
HCT: 32.5 % — ABNORMAL LOW (ref 35.0–47.0)
HEMOGLOBIN: 10.6 g/dL — AB (ref 12.0–16.0)
MCH: 27.7 pg (ref 26.0–34.0)
MCHC: 32.7 g/dL (ref 32.0–36.0)
MCV: 84.6 fL (ref 80.0–100.0)
PLATELETS: 409 10*3/uL (ref 150–440)
RBC: 3.84 MIL/uL (ref 3.80–5.20)
RDW: 12.5 % (ref 11.5–14.5)
WBC: 16.1 10*3/uL — AB (ref 3.6–11.0)

## 2016-08-31 LAB — GLUCOSE, CAPILLARY
GLUCOSE-CAPILLARY: 379 mg/dL — AB (ref 65–99)
GLUCOSE-CAPILLARY: 179 mg/dL — AB (ref 65–99)
Glucose-Capillary: 262 mg/dL — ABNORMAL HIGH (ref 65–99)
Glucose-Capillary: 355 mg/dL — ABNORMAL HIGH (ref 65–99)

## 2016-08-31 LAB — EXPECTORATED SPUTUM ASSESSMENT W REFEX TO RESP CULTURE

## 2016-08-31 LAB — VANCOMYCIN, TROUGH: VANCOMYCIN TR: 24 ug/mL — AB (ref 15–20)

## 2016-08-31 MED ORDER — INSULIN GLARGINE 100 UNIT/ML ~~LOC~~ SOLN
10.0000 [IU] | Freq: Every day | SUBCUTANEOUS | Status: DC
  Administered 2016-08-31: 11:00:00 10 [IU] via SUBCUTANEOUS
  Filled 2016-08-31 (×2): qty 0.1

## 2016-08-31 NOTE — Progress Notes (Signed)
Inpatient Diabetes Program Recommendations  AACE/ADA: New Consensus Statement on Inpatient Glycemic Control (2015)  Target Ranges:  Prepandial:   less than 140 mg/dL      Peak postprandial:   less than 180 mg/dL (1-2 hours)      Critically ill patients:  140 - 180 mg/dL   Lab Results  Component Value Date   GLUCAP 262 (H) 08/31/2016   HGBA1C 12.3 (H) 08/26/2016    Review of Glycemic Control  Results for Charlotte Cross, Charlotte Cross (MRN [id number]) as of 08/31/2016 11:14  Ref. Range 08/30/2016 12:11 08/30/2016 16:44 08/30/2016 17:00 08/30/2016 21:26 08/31/2016 07:38  Glucose-Capillary Latest Ref Range: 65 - 99 mg/dL 435 (H) 418 (H) 394 (H) 311 (H) 262 (H)    Home DM Meds: Metformin 1000 mg BID  Current Insulin Orders: Novolog Resistant Correction Scale/ SSI (0-20 units) TID, Novolog 0-5 units qhs, Lantus 10 units    Recommendations:  Please consider increasing Lantus 20 units qday starting tomorrow am.   Please consider adding mealtime Novolog 6 units tid (hold if patient eats less than 50%)  Gentry Fitz, RN, BA, [place], CDE Diabetes Coordinator Inpatient Diabetes Program  [phone] (Team Pager) [phone] ([place]) 08/31/2016 11:32 AM

## 2016-08-31 NOTE — Discharge Summary (Signed)
[place] at [place] NAME: Charlotte Cross    MR#:  [id number]  DATE OF BIRTH:  1965-12-09  DATE OF ADMISSION:  08/25/2016 ADMITTING PHYSICIAN: Nicholes Mango, MD  DATE OF DISCHARGE: 08/27/2016 11:23 AM  PRIMARY CARE PHYSICIAN: Richrd Humbles, MD   ADMISSION DIAGNOSIS:  Hyponatremia [E87.1] Dyspnea [R06.00] Acute renal insufficiency [N28.9] Pneumonia of left upper lobe due to infectious organism ([place]) [J18.1]  DISCHARGE DIAGNOSIS:  Active Problems:   PNA (pneumonia)   SECONDARY DIAGNOSIS:   Past Medical History:  Diagnosis Date  . Aspergillosis ([place])   . COPD (chronic obstructive pulmonary disease) ([place])   . Diabetes mellitus without complication ([place])   . Hypertension   . Sarcoidosis      ADMITTING HISTORY  HISTORY OF PRESENT ILLNESS:  Charlotte Cross  is a 51 y.o. female with a known history of Diabetes mellitus, hypertension, COPD, sarcoidosis is presenting to the ED with a chief complaint of shortness of breath which has been progressively getting worse for the past 2 weeks. Patient lives on 2 L of oxygen via nasal cannula and currently she is at 4 L of oxygen and reports nonproductive cough with chills but no fever. Patient was seen by her pulmonologist Dr. Raul Del 2 days ago who started her on levofloxacin. CT chest has revealed left upper lobe consolidation   HOSPITAL COURSE:   [place] Cross a 51 y.o. femalewith a known history of Diabetes mellitus, hypertension, COPD, sarcoidosis is presenting to the ED with a chief complaint of shortness of breath which has been progressively getting worse for the past 2 weeks  # LUL pneumonia with COPD exacerbation And acute on chronic respiratory failure with sepsis Treated with IV steroids, nebulizers. Patient does have A true lesions from prior aspergillosis. Seems to have pneumonia. This area. She was treated with IV steroids and antibiotics along with scheduled nebulizers. She  improved well. By the day of discharge she feels back to her baseline with breathing. Continues on 2 L oxygen that she normally wears. Patient will be discharged home on oral prednisone and Levaquin.  #Acute kidney injury from poor by mouth intake Resolved with IVF Stopped fluids  #Hyponatremia 2/2 dehydration from poor by mouth intake Resolved  #Hyperkalemia - resolved Hold lisinopril  #Diabetes mellitus SSI 1 dose of Lantus given due to elevated blood sugars on high-dose IV steroids. Patient will likely have elevated sugars for the next 3-4 days being on oral prednisone. Does not need insulin at discharge.  #History of sarcoidosis Currently not on any medications other than breathing treatments as needed  Needs close follow-up with her pulmonologist within a week.  CONSULTS OBTAINED:    DRUG ALLERGIES:  No Known Allergies  DISCHARGE MEDICATIONS:   Discharge Medication List as of 08/27/2016 10:29 AM    START taking these medications   Details  predniSONE (DELTASONE) 20 MG tablet Take 2 tablets (40 mg total) by mouth daily with breakfast., Starting Sun 08/27/2016, Normal      CONTINUE these medications which have CHANGED   Details  budesonide (PULMICORT) 0.5 MG/2ML nebulizer solution Inhale 2 mLs (0.5 mg total) into the lungs every 12 (twelve) hours., Starting Sun 08/27/2016, No Print      CONTINUE these medications which have NOT CHANGED   Details  albuterol (PROVENTIL HFA;VENTOLIN HFA) 108 (90 BASE) MCG/ACT inhaler Inhale 2 puffs into the lungs every 6 (six) hours as needed for wheezing or shortness of breath., Until Discontinued, Historical Med  albuterol (PROVENTIL) (2.5 MG/3ML) 0.083% nebulizer solution Inhale 2.5 mg into the lungs every 6 (six) hours as needed for wheezing or shortness of breath. , Until Discontinued, Historical Med    aspirin EC 325 MG tablet Take 325 mg by mouth daily., Until Discontinued, Historical Med    cyclobenzaprine (FLEXERIL) 10 MG  tablet Take 10 mg by mouth 3 (three) times daily as needed for muscle spasms. , Until Discontinued, Historical Med    gabapentin (NEURONTIN) 300 MG capsule Take 300 mg by mouth 3 (three) times daily., Until Discontinued, Historical Med    hydrochlorothiazide (HYDRODIURIL) 25 MG tablet Take 25 mg by mouth daily., Until Discontinued, Historical Med    levofloxacin (LEVAQUIN) 500 MG tablet Take 500 mg by mouth daily., Starting Wed 08/23/2016, Historical Med    lisinopril (PRINIVIL,ZESTRIL) 10 MG tablet Take 10 mg by mouth daily. , Historical Med    metFORMIN (GLUCOPHAGE) 1000 MG tablet Take 1,000 mg by mouth 2 (two) times daily with a meal., Until Discontinued, Historical Med    pravastatin (PRAVACHOL) 40 MG tablet Take 40 mg by mouth at bedtime., Until Discontinued, Historical Med    glipiZIDE (GLIPIZIDE XL) 2.5 MG 24 hr tablet Take 1 tablet (2.5 mg total) by mouth daily with breakfast., Starting Sat 12/26/2014, Print        Today   VITAL SIGNS:  Blood pressure 140/85, pulse 89, temperature 97.5 F (36.4 C), temperature source Oral, resp. rate 20, height _0  (1.803 m), weight 106.6 kg (235 lb), last menstrual period 11/24/2014, SpO2 99 %.  I/O:  No intake or output data in the 24 hours ending 08/31/16 1129  PHYSICAL EXAMINATION:  Physical Exam  GENERAL:  51 y.o.-year-old patient lying in the bed with no acute distress.  LUNGS: Normal breath sounds bilaterally, no wheezing, rales,rhonchi or crepitation. No use of accessory muscles of respiration.  CARDIOVASCULAR: S1, S2 normal. No murmurs, rubs, or gallops.  ABDOMEN: Soft, non-tender, non-distended. Bowel sounds present. No organomegaly or mass.  NEUROLOGIC: Moves all 4 extremities. PSYCHIATRIC: The patient is alert and oriented x 3.  SKIN: No obvious rash, lesion, or ulcer.   DATA REVIEW:   CBC  Recent Labs Lab 08/31/16 0305  WBC 16.1*  HGB 10.6*  HCT 32.5*  PLT 409    Chemistries   Recent Labs Lab 08/30/16 0505  08/31/16 0305  NA 135 137  K 4.1 3.9  CL 94* 97*  CO2 33* 34*  GLUCOSE 168* 112*  BUN 25* 21*  CREATININE 0.85 0.91  CALCIUM 8.9 8.7*  AST 13*  --   ALT 9*  --   ALKPHOS 77  --   BILITOT 0.5  --     Cardiac Enzymes  Recent Labs Lab 08/25/16 1131  TROPONINI <0.03    Microbiology Results  Results for orders placed or performed during the hospital encounter of 08/25/16  Culture, blood (routine x 2) Call MD if unable to obtain prior to antibiotics being given     Status: None   Collection Time: 08/25/16 11:25 AM  Result Value Ref Range Status   Specimen Description BLOOD RIGHT HAND  Final   Special Requests   Final    BOTTLES DRAWN AEROBIC AND ANAEROBIC Blood Culture adequate volume   Culture NO GROWTH 5 DAYS  Final   Report Status 08/30/2016 FINAL  Final  Culture, blood (routine x 2) Call MD if unable to obtain prior to antibiotics being given     Status: None   Collection Time: 08/25/16  11:31 AM  Result Value Ref Range Status   Specimen Description BLOOD LEFT ASSIST CONTROL  Final   Special Requests   Final    BOTTLES DRAWN AEROBIC AND ANAEROBIC Blood Culture adequate volume   Culture NO GROWTH 5 DAYS  Final   Report Status 08/30/2016 FINAL  Final  Culture, sputum-assessment     Status: None   Collection Time: 08/25/16  5:31 PM  Result Value Ref Range Status   Specimen Description SPUTUM  Final   Special Requests NONE  Final   Sputum evaluation THIS SPECIMEN IS ACCEPTABLE FOR SPUTUM CULTURE  Final   Report Status 08/25/2016 FINAL  Final  Culture, respiratory (NON-Expectorated)     Status: None   Collection Time: 08/25/16  5:31 PM  Result Value Ref Range Status   Specimen Description SPUTUM  Final   Special Requests NONE Reflexed from [id number]  Final   Gram Stain   Final    MODERATE WBC PRESENT, PREDOMINANTLY PMN FEW SQUAMOUS EPITHELIAL CELLS PRESENT ABUNDANT GRAM NEGATIVE COCCOBACILLI ABUNDANT GRAM POSITIVE RODS ABUNDANT GRAM POSITIVE COCCI IN PAIRS Performed  at [facility], [place] [address], [place], [place] [address]    Culture   Final    RARE FUNGUS (MOLD) ISOLATED, PROBABLE CONTAMINANT/COLONIZER (SAPROPHYTE). CONTACT MICROBIOLOGY IF FURTHER IDENTIFICATION REQUIRED [phone].   Report Status 08/28/2016 FINAL  Final    RADIOLOGY:  Dg Chest 2 View  Result Date: 08/30/2016 CLINICAL DATA:  Subacute onset of cough, shortness of breath and generalized chest pain. Initial encounter. EXAM: CHEST  2 VIEW COMPARISON:  Chest radiograph performed 12/24/2014, and CT of the chest performed 08/25/2016 FINDINGS: Underlying changes of sarcoidosis are again noted. There is perhaps somewhat increased airspace opacity at the left midlung zone, which could reflect superimposed pneumonia. There is chronic elevation of the left hemidiaphragm. Underlying bilateral scarring is noted. No pleural effusion or pneumothorax is seen. The cardiomediastinal silhouette is normal in size. No acute osseous abnormalities are identified. IMPRESSION: 1. Somewhat increased airspace opacity at the left midlung zone, which could reflect superimposed pneumonia. 2. Underlying changes of pulmonary sarcoidosis again noted. Chronic elevation of the left hemidiaphragm, and bilateral scarring. Electronically Signed   By: Garald Balding M.D.   On: 08/30/2016 05:48    Follow up with PCP in 1 week.  Management plans discussed with the patient, family and they are in agreement.  CODE STATUS:  Code Status History    Date Active Date Inactive Code Status Order ID Comments User Context   08/25/2016  5:10 PM 08/27/2016  2:28 PM Full Code [id number]  Nicholes Mango, MD Inpatient   12/24/2014  8:55 PM 12/26/2014  5:19 PM Full Code [id number]  Vaughan Basta, MD Inpatient      TOTAL TIME TAKING CARE OF THIS PATIENT ON DAY OF DISCHARGE: more than 30 minutes.   Hillary Bow R M.D on 08/31/2016 at 11:29 AM  Between 7am to 6pm - Pager - [phone]  After 6pm go to [email or web address] - password  [email or web address]  Office  [phone]  CC: Primary care physician; Richrd Humbles, MD  Note: This dictation was prepared with Dragon dictation along with smaller phrase technology. Any transcriptional errors that result from this process are unintentional.

## 2016-08-31 NOTE — Progress Notes (Addendum)
Pharmacy Antibiotic Note  Charlotte Cross is a 51 y.o. female admitted on 08/30/2016 with pneumonia.  Pharmacy has been consulted for vancomycin and cefepime dosing.  Plan: 6/14 vancomycin trough resulted at 24. Goal 15-20. Noon dose given before trough resulted. Will hold any additional doses and recheck tough at 0000 tomorrow (12 hours from last dose). If trough is <20, will redose vancomycin for 1574m q 12 hours at that time. New t1/2=10.5hr ke=0.66 vd=74.8  6/15 0000 vanc level 21. Recheck in 12 hours with serum creatinine. Patient does not appear to be clearing as quickly as anticipated.  Continue Cefepime 2 grams q 8 hours as ordered.  Height: _0  (180.3 cm) Weight: 235 lb 9.6 oz (106.9 kg) IBW/kg (Calculated) : 70.8  Temp (24hrs), Avg:98.4 F (36.9 C), Min:97.9 F (36.6 C), Max:98.9 F (37.2 C)   Recent Labs Lab 08/25/16 1131 08/26/16 0523 08/30/16 0505 08/31/16 0305 08/31/16 1117  WBC 12.8* 10.2 15.7* 16.1*  --   CREATININE 1.69* 1.19* 0.85 0.91  --   LATICACIDVEN  --   --  1.2  --   --   VANCOTROUGH  --   --   --   --  24*    Estimated Creatinine Clearance: 96.2 mL/min (by C-G formula based on SCr of 0.91 mg/dL).    No Known Allergies  Antimicrobials this admission: Vancomycin cefepime 6/13  >>    >>   Dose adjustments this admission:   Microbiology results: 6/13 BCx: pending 6/8 Sputum: GPC, GPR, GN coccobacilli  6/14 Sputum 6/13 UA: pending 6/13 CXR: Increased L midlung opacity Thank you for allowing pharmacy to be a part of this patient's care.  MRamond Dial Pharm.D, BCPS Clinical Pharmacist  08/31/2016 1:29 PM

## 2016-08-31 NOTE — Progress Notes (Signed)
Date: 08/31/2016,   MRN# [id number] COPELYN WIDMER 12-21-1965 Code Status:     Code Status Orders        Start     Ordered   08/30/16 0726  Full code  Continuous     08/30/16 0725    Code Status History    Date Active Date Inactive Code Status Order ID Comments User Context   08/25/2016  5:10 PM 08/27/2016  2:28 PM Full Code [id number]  Nicholes Mango, MD Inpatient   12/24/2014  8:55 PM 12/26/2014  5:19 PM Full Code [id number]  Vaughan Basta, MD Inpatient     Hosp day:_0 @ Referring MD: _1 @      HPI: feeling some better, less wheeze/coughing. Sputum dark, no blood.  PMHX:   Past Medical History:  Diagnosis Date  . Aspergillosis ([place])   . COPD (chronic obstructive pulmonary disease) ([place])   . Diabetes mellitus without complication ([place])   . Hypertension   . Sarcoidosis    Surgical Hx:  Past Surgical History:  Procedure Laterality Date  . LUNG REMOVAL, PARTIAL     left   Family Hx:  Family History  Problem Relation Age of Onset  . Diabetes Mother   . Diabetes Father   . Diabetes Brother    Social Hx:   Social History  Substance Use Topics  . Smoking status: Never Smoker  . Smokeless tobacco: Never Used  . Alcohol use No   Medication:    Home Medication:    Current Medication: _2 @   Allergies:  Patient has no known allergies.  Review of Systems: Gen:  Denies  fever, sweats, chills HEENT: Denies blurred vision, double vision, ear pain, eye pain, hearing loss, nose bleeds, sore throat Cvc:  No dizziness, chest pain or heaviness Resp:   Less sob, less cough, no blood Gi: Denies swallowing difficulty, stomach pain, nausea or vomiting, diarrhea, constipation, bowel incontinence Gu:  Denies bladder incontinence, burning urine Ext:   No Joint pain, stiffness or swelling Skin: No skin rash, easy bruising or bleeding or hives Endoc:  No polyuria, polydipsia , polyphagia or weight change Psych: No depression, insomnia or  hallucinations  Other:  All other systems negative  Physical Examination:   VS: BP (!) 141/81 (BP Location: Left Arm)   Pulse 85   Temp 97.9 F (36.6 C) (Oral)   Resp 20   Ht _3  (1.803 m)   Wt 235 lb 9.6 oz (106.9 kg)   LMP 11/24/2014   SpO2 100%   BMI 32.86 kg/m   General Appearance: No distress  Neuro: without focal findings, mental status, speech normal, alert and oriented, cranial nerves 2-12 intact, reflexes normal and symmetric, sensation grossly normal  HEENT: PERRLA, EOM intact, no ptosis, no other lesions noticed, Pulmonary:less rhonchi/ wheezing, No rales   Cardiovascular:  Normal S1,S2.  No m/r/g.  Abdominal aorta pulsation normal.    Abdomen:Benign, Soft, non-tender, No masses, hepatosplenomegaly, No lymphadenopathy Endoc: No evident thyromegaly, no signs of acromegaly or Cushing features Skin:   warm, no rashes, no ecchymosis  Extremities: normal, no cyanosis, clubbing, no edema, warm with normal capillary refill. Other findings:   Labs results:   Recent Labs     08/30/16  0505  08/31/16  0305  HGB  11.1*  10.6*  HCT  33.3*  32.5*  MCV  83.2  84.6  WBC  15.7*  16.1*  BUN  25*  21*  CREATININE  0.85  0.91  GLUCOSE  168*  112*  CALCIUM  8.9  8.7*  ,   mrsa pcr  - ve   Assessment and Plan: Left mid chest infiltrate, c/w pneumonia, doubt fungal. Slowly improving clinically Agree with vanco, cefepime Sputum c/s, fungal pending Aspergillosis galactomannan  Sarcoidosis with background fibrosis -ace level pending  Copd, ex smoker -nebs -oxygen -dvt prophylaxis  Elevated left diaphragm post LLL resection. ? Paralyzed -consider sniff test  Suspect sleep apnea -out patient sleep study ordered    I have personally obtained a history, examined the patient, evaluated laboratory and imaging results, formulated the assessment and plan and placed orders.  The Patient requires high complexity decision making for assessment and support,  frequent evaluation and titration of therapies, application of advanced monitoring technologies and extensive interpretation of multiple databases.   Ashley Bultema,M.D. Pulmonary & Critical care Medicine [facility]

## 2016-08-31 NOTE — Progress Notes (Signed)
[facility] [place] NAME: Charlotte Cross    MR#:  [id number]  DATE OF BIRTH:  05-06-1965  SUBJECTIVE:   Patient with increased sputum production this am   REVIEW OF SYSTEMS:    Review of Systems  Constitutional: Negative for fever, chills weight loss HENT: Negative for ear pain, nosebleeds, congestion, facial swelling, rhinorrhea, neck pain, neck stiffness and ear discharge.   Respiratory: ++ for cough, shortness of breath, NO wheezing  Cardiovascular: Negative for chest pain, palpitations and leg swelling.  Gastrointestinal: Negative for heartburn, abdominal pain, vomiting, diarrhea or consitpation Genitourinary: Negative for dysuria, urgency, frequency, hematuria Musculoskeletal: Negative for back pain or joint pain Neurological: Negative for dizziness, seizures, syncope, focal weakness,  numbness and headaches.  Hematological: Does not bruise/bleed easily.  Psychiatric/Behavioral: Negative for hallucinations, confusion, dysphoric mood    Tolerating Diet: yes      DRUG ALLERGIES:  No Known Allergies  VITALS:  Blood pressure 128/76, pulse 93, temperature 98.9 F (37.2 C), temperature source Oral, resp. rate 20, height _0  (1.803 m), weight 106.9 kg (235 lb 9.6 oz), last menstrual period 11/24/2014, SpO2 96 %.  PHYSICAL EXAMINATION:  Constitutional: Appears well-developed and well-nourished. No distress. HENT: Normocephalic. Marland Kitchen Oropharynx is clear and moist.  Eyes: Conjunctivae and EOM are normal. PERRLA, no scleral icterus.  Neck: Normal ROM. Neck supple. No JVD. No tracheal deviation. CVS: RRR, S1/S2 +, no murmurs, no gallops, no carotid bruit.  Pulmonary: Effort and breath sounds normal, no stridor, rhonchi, wheezes, rales.  Abdominal: Soft. BS +,  no distension, tenderness, rebound or guarding.  Musculoskeletal: Normal range of motion. No edema and no tenderness.  Neuro: Alert. CN 2-12 grossly intact. No focal  deficits. Skin: Skin is warm and dry. No rash noted. Psychiatric: Normal mood and affect.      LABORATORY PANEL:   CBC  Recent Labs Lab 08/31/16 0305  WBC 16.1*  HGB 10.6*  HCT 32.5*  PLT 409   ------------------------------------------------------------------------------------------------------------------  Chemistries   Recent Labs Lab 08/30/16 0505 08/31/16 0305  NA 135 137  K 4.1 3.9  CL 94* 97*  CO2 33* 34*  GLUCOSE 168* 112*  BUN 25* 21*  CREATININE 0.85 0.91  CALCIUM 8.9 8.7*  AST 13*  --   ALT 9*  --   ALKPHOS 77  --   BILITOT 0.5  --    ------------------------------------------------------------------------------------------------------------------  Cardiac Enzymes  Recent Labs Lab 08/25/16 1131  TROPONINI <0.03   ------------------------------------------------------------------------------------------------------------------  RADIOLOGY:  Dg Chest 2 View  Result Date: 08/30/2016 CLINICAL DATA:  Subacute onset of cough, shortness of breath and generalized chest pain. Initial encounter. EXAM: CHEST  2 VIEW COMPARISON:  Chest radiograph performed 12/24/2014, and CT of the chest performed 08/25/2016 FINDINGS: Underlying changes of sarcoidosis are again noted. There is perhaps somewhat increased airspace opacity at the left midlung zone, which could reflect superimposed pneumonia. There is chronic elevation of the left hemidiaphragm. Underlying bilateral scarring is noted. No pleural effusion or pneumothorax is seen. The cardiomediastinal silhouette is normal in size. No acute osseous abnormalities are identified. IMPRESSION: 1. Somewhat increased airspace opacity at the left midlung zone, which could reflect superimposed pneumonia. 2. Underlying changes of pulmonary sarcoidosis again noted. Chronic elevation of the left hemidiaphragm, and bilateral scarring. Electronically Signed   By: Garald Balding M.D.   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   1. Acute on chronic hypoxic respiratory failure in the setting of left lobe pneumonia (failed outpatient therapy) Continue cefepime and vancomycin. Appreciate pulmonary consultation Wean oxygen to baseline 2-3 L.  2. Left midlung zone pneumonia: Continue cefepime and vancomycin Follow up on sputum culture   3. COPD without exacerbation and chronic respiratory failure: Stop steroids  Continue neb treatments  4. Diabetes: Continue sliding scale insulin, ADA diet and add Lantus  5. Sarcoidosis with pulmonary fibrosis: Appreciate pulmonary consultation  6. Essential hypertension: Continue lisinopril  7. Hyperlipidemia: Continue pravastatin    Management plans discussed with the patient and she is in agreement.  CODE STATUS: full  TOTAL TIME TAKING CARE OF THIS PATIENT: 27 minutes.     POSSIBLE D/C 1-2 days, DEPENDING ON CLINICAL CONDITION.   Ashante Snelling M.D on 08/31/2016 at 7:16 AM  Between 7am to 6pm - Pager - [phone] After 6pm go to [email or web address] - password [email or web address] [facility]  Office  [phone]  CC: Primary care physician; Richrd Humbles, MD  Note: This dictation was prepared with Dragon dictation along with smaller phrase technology. Any transcriptional errors that result from this process are unintentional.

## 2016-09-01 ENCOUNTER — Inpatient Hospital Stay: Payer: Medicare Other

## 2016-09-01 LAB — VANCOMYCIN, TROUGH: Vancomycin Tr: 21 ug/mL (ref 15–20)

## 2016-09-01 LAB — GLUCOSE, CAPILLARY
GLUCOSE-CAPILLARY: 232 mg/dL — AB (ref 65–99)
GLUCOSE-CAPILLARY: 167 mg/dL — AB (ref 65–99)
Glucose-Capillary: 198 mg/dL — ABNORMAL HIGH (ref 65–99)
Glucose-Capillary: 229 mg/dL — ABNORMAL HIGH (ref 65–99)

## 2016-09-01 MED ORDER — BISACODYL 5 MG PO TBEC
5.0000 mg | DELAYED_RELEASE_TABLET | Freq: Every day | ORAL | Status: DC | PRN
  Administered 2016-09-01 – 2016-09-03 (×3): 5 mg via ORAL
  Filled 2016-09-01 (×3): qty 1

## 2016-09-01 MED ORDER — LIVING WELL WITH DIABETES BOOK
Freq: Once | Status: AC
  Administered 2016-09-01: 10:00:00
  Filled 2016-09-01: qty 1

## 2016-09-01 MED ORDER — INSULIN GLARGINE 100 UNIT/ML ~~LOC~~ SOLN
20.0000 [IU] | Freq: Every day | SUBCUTANEOUS | Status: DC
  Administered 2016-09-01 – 2016-09-02 (×2): 20 [IU] via SUBCUTANEOUS
  Filled 2016-09-01 (×3): qty 0.2

## 2016-09-01 MED ORDER — INSULIN STARTER KIT- PEN NEEDLES (ENGLISH)
1.0000 | Freq: Once | Status: AC
  Administered 2016-09-01: 15:00:00 1
  Filled 2016-09-01: qty 1

## 2016-09-01 NOTE — Progress Notes (Signed)
Inpatient Diabetes Program Recommendations  AACE/ADA: New Consensus Statement on Inpatient Glycemic Control (2015)  Target Ranges:  Prepandial:   less than 140 mg/dL      Peak postprandial:   less than 180 mg/dL (1-2 hours)      Critically ill patients:  140 - 180 mg/dL   Lab Results  Component Value Date   GLUCAP 198 (H) 09/01/2016   HGBA1C 12.3 (H) 08/26/2016    Review of Glycemic Control  Results for Charlotte, Cross (MRN [id number]) as of 09/01/2016 09:03  Ref. Range 08/31/2016 07:38 08/31/2016 11:44 08/31/2016 16:33 08/31/2016 20:51 09/01/2016 07:34  Glucose-Capillary Latest Ref Range: 65 - 99 mg/dL 262 (H) 379 (H) 355 (H) 179 (H) 198 (H)   Home DM Meds: Metformin 1000 mg BID  Current Insulin Orders: Novolog ResistantCorrection Scale/ SSI (0-20units) TID, Novolog 0-5 units qhs, Lantus 20 units qday   Recommendations:  If the patient is going to be discharged home with insulin, please begin insulin pen teaching with the patient.  I have ordered the insulin pen teaching kit. Please have patient give her own insulin at each opportunity and review the administration of insulin via syringe (she used it a few years ago)  Gentry Fitz, RN, [place], [place], CDE Diabetes Coordinator Inpatient Diabetes Program  [phone] (Team Pager) [phone] ([place]) 09/01/2016 9:04 AM

## 2016-09-01 NOTE — Progress Notes (Signed)
[facility] at [place] NAME: Charlotte Cross    MR#:  [id number]  DATE OF BIRTH:  26-Mar-1965  SUBJECTIVE:   Patient Feels about the same. Continues to have some left-sided pain where her pneumonia is. Shortness of breath is improved   REVIEW OF SYSTEMS:    Review of Systems  Constitutional: Negative for fever, chills weight loss HENT: Negative for ear pain, nosebleeds, congestion, facial swelling, rhinorrhea, neck pain, neck stiffness and ear discharge.   Respiratory: Cough and shortness of breath improved, NO wheezing  Cardiovascular: Negative for chest pain, palpitations and leg swelling.  Gastrointestinal: Negative for heartburn, abdominal pain, vomiting, diarrhea or consitpation Genitourinary: Negative for dysuria, urgency, frequency, hematuria Musculoskeletal: Negative for back pain or joint pain Neurological: Negative for dizziness, seizures, syncope, focal weakness,  numbness and headaches.  Hematological: Does not bruise/bleed easily.  Psychiatric/Behavioral: Negative for hallucinations, confusion, dysphoric mood    Tolerating Diet: yes      DRUG ALLERGIES:  No Known Allergies  VITALS:  Blood pressure 140/84, pulse 95, temperature 98.2 F (36.8 C), temperature source Oral, resp. rate 14, height _0  (1.803 m), weight 106.9 kg (235 lb 9.6 oz), last menstrual period 11/24/2014, SpO2 98 %.  PHYSICAL EXAMINATION:  Constitutional: Appears well-developed and well-nourished. No distress. HENT: Normocephalic. Marland Kitchen Oropharynx is clear and moist.  Eyes: Conjunctivae and EOM are normal. PERRLA, no scleral icterus.  Neck: Normal ROM. Neck supple. No JVD. No tracheal deviation. CVS: RRR, S1/S2 +, no murmurs, no gallops, no carotid bruit.  Pulmonary: Left upper lobe wheezing no rales or crackles normal respiratory effort Abdominal: Soft. BS +,  no distension, tenderness, rebound or guarding.  Musculoskeletal: Normal range of motion. No  edema and no tenderness.  Neuro: Alert. CN 2-12 grossly intact. No focal deficits. Skin: Skin is warm and dry. No rash noted. Psychiatric: Normal mood and affect.      LABORATORY PANEL:   CBC  Recent Labs Lab 08/31/16 0305  WBC 16.1*  HGB 10.6*  HCT 32.5*  PLT 409   ------------------------------------------------------------------------------------------------------------------  Chemistries   Recent Labs Lab 08/30/16 0505 08/31/16 0305  NA 135 137  K 4.1 3.9  CL 94* 97*  CO2 33* 34*  GLUCOSE 168* 112*  BUN 25* 21*  CREATININE 0.85 0.91  CALCIUM 8.9 8.7*  AST 13*  --   ALT 9*  --   ALKPHOS 77  --   BILITOT 0.5  --    ------------------------------------------------------------------------------------------------------------------  Cardiac Enzymes  Recent Labs Lab 08/25/16 1131  TROPONINI <0.03   ------------------------------------------------------------------------------------------------------------------  RADIOLOGY:  No results found.   ASSESSMENT AND PLAN:   51 year old female with a history of sarcoidosis, status post left lower lobe resection due to Aspergillus pneumonia pulmonary fibrosis, suspected sleep apnea and chronic respiratory failure on 2 to be liters of oxygen who failed outpatient treatment for pneumonia/bronchitis with Levaquin 2 presented with shortness of breath.   1. Acute on chronic hypoxic respiratory failure in the setting of left lobe pneumonia (failed outpatient therapy) Continue cefepime Sputum culture with gram-negative rods. I will discontinue vancomycin Appreciate pulmonary consultation Wean oxygen to baseline 2-3 L.  2. Left midlung zone pneumonia: Continue cefepime Follow-up sputum culture   3. COPD without exacerbation and chronic respiratory failure: Continue inhalers Continue neb treatments  4. Diabetes: Continue sliding scale insulin, ADA diet and increased dose of Lantus 5. Sarcoidosis with pulmonary  fibrosis: Appreciate pulmonary consultation  6. Essential hypertension: Continue lisinopril  7. Hyperlipidemia: Continue  pravastatin    Management plans discussed with the patient and she is in agreement.  CODE STATUS: full  TOTAL TIME TAKING CARE OF THIS PATIENT: 24 minutes.     POSSIBLE D/C tomorrow, DEPENDING ON CLINICAL CONDITION.   Dawnette Mione M.D on 09/01/2016 at 8:12 AM  Between 7am to 6pm - Pager - [phone] After 6pm go to [email or web address] - password [email or web address] [facility]  Office  [phone]  CC: Primary care physician; Richrd Humbles, MD  Note: This dictation was prepared with Dragon dictation along with smaller phrase technology. Any transcriptional errors that result from this process are unintentional.

## 2016-09-01 NOTE — Care Management (Signed)
Admitted to this facility with the diagnosis of pneumonia, Lives with husband, Charlotte Cross, Mother is Ahja Martello [phone]). Prescriptions are filled at [facility] and [facility] on [address]. Last seen Dr. Guy Sandifer 06/14/16. Last seen Dr, Raul Del 08/23/16. No home health. No skilled facility. Home oxygen per [facility] since 2000. Uses 2-3 liters per nasal cannula continuous. Got dizzy prior to this admission. Catch before hitting the floor. Decreased appetite x 3 weeks. Lost 14 pounds. Takes care of all basic activities of daily living herself, drives. Family will transport Shelbie Ammons RN MSN CCM Care Management [phone]

## 2016-09-02 LAB — GLUCOSE, CAPILLARY
GLUCOSE-CAPILLARY: 418 mg/dL — AB (ref 65–99)
Glucose-Capillary: 164 mg/dL — ABNORMAL HIGH (ref 65–99)
Glucose-Capillary: 239 mg/dL — ABNORMAL HIGH (ref 65–99)
Glucose-Capillary: 422 mg/dL — ABNORMAL HIGH (ref 65–99)
Glucose-Capillary: 292 mg/dL — ABNORMAL HIGH (ref 65–99)

## 2016-09-02 LAB — URINALYSIS, ROUTINE W REFLEX MICROSCOPIC
Bacteria, UA: NONE SEEN
Bilirubin Urine: NEGATIVE
Glucose, UA: >=500 mg/dL — AB
Hgb urine dipstick: NEGATIVE
Ketones, ur: NEGATIVE mg/dL
Leukocytes, UA: NEGATIVE
Nitrite: NEGATIVE
Protein, ur: NEGATIVE mg/dL
RBC / HPF: NONE SEEN RBC/hpf (ref 0–5)
Specific Gravity, Urine: 1.013 (ref 1.005–1.030)
pH: 5 (ref 5.0–8.0)

## 2016-09-02 LAB — GLUCOSE, RANDOM: Glucose, Bld: 424 mg/dL — ABNORMAL HIGH (ref 65–99)

## 2016-09-02 MED ORDER — BISACODYL 10 MG RE SUPP
10.0000 mg | Freq: Every day | RECTAL | Status: DC
  Administered 2016-09-02 – 2016-09-03 (×2): 10 mg via RECTAL
  Filled 2016-09-02 (×3): qty 1

## 2016-09-02 MED ORDER — INSULIN ASPART 100 UNIT/ML ~~LOC~~ SOLN
22.0000 [IU] | Freq: Once | SUBCUTANEOUS | Status: AC
  Administered 2016-09-02: 22 [IU] via SUBCUTANEOUS
  Filled 2016-09-02: qty 22

## 2016-09-02 MED ORDER — METHYLPREDNISOLONE SODIUM SUCC 40 MG IJ SOLR
40.0000 mg | Freq: Two times a day (BID) | INTRAMUSCULAR | Status: DC
  Administered 2016-09-02 (×2): 40 mg via INTRAVENOUS
  Filled 2016-09-02 (×2): qty 1

## 2016-09-02 MED ORDER — POLYETHYLENE GLYCOL 3350 17 G PO PACK
17.0000 g | PACK | Freq: Every day | ORAL | Status: DC
  Administered 2016-09-02 – 2016-09-03 (×2): 17 g via ORAL
  Filled 2016-09-02 (×2): qty 1

## 2016-09-02 MED ORDER — INSULIN GLARGINE 100 UNIT/ML ~~LOC~~ SOLN
22.0000 [IU] | Freq: Every day | SUBCUTANEOUS | Status: DC
  Administered 2016-09-03: 09:00:00 22 [IU] via SUBCUTANEOUS
  Filled 2016-09-02 (×2): qty 0.22

## 2016-09-02 NOTE — Plan of Care (Signed)
Problem: Education: Goal: Knowledge of [facility] Education information/materials will improve Outcome: Not Progressing Pt's Last Bowel Movement 08/28/2016, has active bowel sounds x 4; pt verbalized that her PO intake has not been good for a while; took PO Bisacodyl and Miralax; aware she has scheduled Bisacodyl supp ordered, refused this am's dose, wants to see if the other works first; also has [place] enema ordered if needed, will advise RN when/if she wants one

## 2016-09-02 NOTE — Plan of Care (Signed)
Problem: Education: Goal: Knowledge of [facility] Education information/materials will improve Outcome: Not Progressing Pt started on IV Solu-Medrol 48m q12 hr this shift  Problem: Pain Managment: Goal: General experience of comfort will improve Outcome: Progressing Requires PRN pain medication for c/o chronic back and upper chest pain with decrease in pain level

## 2016-09-02 NOTE — Progress Notes (Addendum)
[facility] at [place] NAME: Laurynn Mccorvey    MR#:  [id number]  DATE OF BIRTH:  [birth year]  SUBJECTIVE:   Patient Feeling better this morning. She had a fever last night.  REVIEW OF SYSTEMS:    Review of Systems  Constitutional: She had fever last night. HENT: Negative for ear pain, nosebleeds, congestion, facial swelling, rhinorrhea, neck pain, neck stiffness and ear discharge.   Respiratory: Cough and shortness of breathHave improved, NO wheezing  Cardiovascular: Negative for chest pain, palpitations and leg swelling.  Gastrointestinal: Negative for heartburn, abdominal pain, vomiting, diarrhea or consitpation Genitourinary: Negative for dysuria, urgency, frequency, hematuria Musculoskeletal: Negative for back pain or joint pain Neurological: Negative for dizziness, seizures, syncope, focal weakness,  numbness and headaches.  Hematological: Does not bruise/bleed easily.  Psychiatric/Behavioral: Negative for hallucinations, confusion, dysphoric mood    Tolerating Diet: yes      DRUG ALLERGIES:  No Known Allergies  VITALS:  Blood pressure 140/89, pulse 91, temperature 98.5 F (36.9 C), temperature source Oral, resp. rate 20, height _0  (1.803 m), weight 106.9 kg (235 lb 9.6 oz), last menstrual period 11/24/2014, SpO2 92 %.  PHYSICAL EXAMINATION:  Constitutional: Appears well-developed and well-nourished. No distress. HENT: Normocephalic. Marland Kitchen Oropharynx is clear and moist.  Eyes: Conjunctivae and EOM are normal. PERRLA, no scleral icterus.  Neck: Normal ROM. Neck supple. No JVD. No tracheal deviation. CVS: RRR, S1/S2 +, no murmurs, no gallops, no carotid bruit.  Pulmonary: b/l wheezing this am no rales or crackles normal respiratory effort Abdominal: Soft. BS +,  no distension, tenderness, rebound or guarding.  Musculoskeletal: Normal range of motion. No edema and no tenderness.  Neuro: Alert. CN 2-12 grossly intact. No focal  deficits. Skin: Skin is warm and dry. No rash noted. Psychiatric: Normal mood and affect.      LABORATORY PANEL:   CBC  Recent Labs Lab 08/31/16 0305  WBC 16.1*  HGB 10.6*  HCT 32.5*  PLT 409   ------------------------------------------------------------------------------------------------------------------  Chemistries   Recent Labs Lab 08/30/16 0505 08/31/16 0305  NA 135 137  K 4.1 3.9  CL 94* 97*  CO2 33* 34*  GLUCOSE 168* 112*  BUN 25* 21*  CREATININE 0.85 0.91  CALCIUM 8.9 8.7*  AST 13*  --   ALT 9*  --   ALKPHOS 77  --   BILITOT 0.5  --    ------------------------------------------------------------------------------------------------------------------  Cardiac Enzymes No results for input(s): TROPONINI in the last 168 hours. ------------------------------------------------------------------------------------------------------------------  RADIOLOGY:  Dg Chest 1 View  Result Date: 09/01/2016 CLINICAL DATA:  Pneumonia, cough, shortness of breath and left-sided chest pain. EXAM: CHEST 1 VIEW COMPARISON:  08/30/2016 and CT chest 08/25/2016. FINDINGS: Trachea is midline. Heart size is grossly stable. Opacification in the upper left hemithorax is unchanged. Left hemidiaphragm is elevated. Probable scarring at the right perihilar region and right lung base. No definite pleural fluid. IMPRESSION: Pulmonary parenchymal changes of sarcoid, as on 08/25/2016. Difficult to exclude superimposed pneumonia in the left lung. Electronically Signed   By: Lorin Picket M.D.   On: 09/01/2016 08:34     ASSESSMENT AND PLAN:   51 year old female with a history of sarcoidosis, status post left lower lobe resection due to Aspergillus pneumonia pulmonary fibrosis, suspected sleep apnea and chronic respiratory failure on 2 to be liters of oxygen who failed outpatient treatment for pneumonia/bronchitis with Levaquin 2 presented with shortness of breath.   1. Acute on chronic  hypoxic respiratory failure in the  setting of left lobe pneumonia (failed outpatient therapy) Continue cefepime Sputum culture with gram-negative rods.Appreciate pulmonary consultation Wean oxygen to baseline 2-3 L.  2. Left midlung zone pneumonia: Continue cefepime Follow-up sputum culture. Call lab this morning for results which are still pending Chest x-ray shows clearing of pneumonia  3. COPD with exacerbation and chronic respiratory failure: Patient with bilateral wheezing this morning. Start IV steroids  Continue inhalers Continue neb treatments  4. Diabetes: Continue sliding scale insulin, ADA diet and Lantus 5. Sarcoidosis with pulmonary fibrosis: Appreciate pulmonary consultation  6. Essential hypertension: Continue lisinopril  7. Hyperlipidemia: Continue pravastatin  8. Fever: Check UA and order ISS.  Management plans discussed with the patient and she is in agreement.  CODE STATUS: full  TOTAL TIME TAKING CARE OF THIS PATIENT: 25 minutes.     POSSIBLE D/C tomorrow, DEPENDING ON CLINICAL CONDITION.   Braylea Brancato M.D on 09/02/2016 at 7:51 AM  Between 7am to 6pm - Pager - [phone] After 6pm go to [email or web address] - password [email or web address] [facility]  Office  [phone]  CC: Primary care physician; Richrd Humbles, MD  Note: This dictation was prepared with Dragon dictation along with smaller phrase technology. Any transcriptional errors that result from this process are unintentional.

## 2016-09-03 LAB — GLUCOSE, CAPILLARY
Glucose-Capillary: 248 mg/dL — ABNORMAL HIGH (ref 65–99)
Glucose-Capillary: 289 mg/dL — ABNORMAL HIGH (ref 65–99)

## 2016-09-03 MED ORDER — LEVOFLOXACIN 750 MG PO TABS: 750.0000 mg | ORAL_TABLET | Freq: Every day | ORAL | 0 refills | Status: DC

## 2016-09-03 MED ORDER — ENSURE ENLIVE PO LIQD: 237.0000 mL | ORAL | 12 refills | Status: AC

## 2016-09-03 MED ORDER — PREDNISONE 50 MG PO TABS: 50.0000 mg | ORAL_TABLET | Freq: Every day | ORAL | 0 refills | Status: DC

## 2016-09-03 MED ORDER — SENNOSIDES-DOCUSATE SODIUM 8.6-50 MG PO TABS
2.0000 | ORAL_TABLET | Freq: Two times a day (BID) | ORAL | Status: DC
  Administered 2016-09-03: 2 via ORAL
  Filled 2016-09-03: qty 2

## 2016-09-03 MED ORDER — LEVOFLOXACIN 750 MG PO TABS: 750.0000 mg | ORAL_TABLET | Freq: Every day | ORAL | 0 refills | Status: AC

## 2016-09-03 MED ORDER — PREDNISONE 50 MG PO TABS: 50.0000 mg | ORAL_TABLET | Freq: Every day | ORAL | 0 refills | Status: AC

## 2016-09-03 MED ORDER — PREDNISONE 50 MG PO TABS
50.0000 mg | ORAL_TABLET | Freq: Every day | ORAL | Status: DC
  Administered 2016-09-03: 09:00:00 50 mg via ORAL
  Filled 2016-09-03: qty 1

## 2016-09-03 NOTE — Progress Notes (Signed)
MD order received to discharge pt home today; verbally reviewed AVS with pt, gave Rxs to pt for Levaquin and Prednisone; no questions voiced at this time; pt's discharge pending her eating lunch and reassessing pain from 1110 assessment

## 2016-09-03 NOTE — Progress Notes (Signed)
Pt reported she coughed up blood around 4am. On assessment, small amount of brown looking blood was seen on the sheets. Pt coughed up some more about 4:30am. Dr. Marcille Blanco is aware. No new orders were received. Pass information on to oncoming nurse.

## 2016-09-03 NOTE — Discharge Summary (Signed)
[facility] NAME: Charlotte Cross    MR#:  [id number]  DATE OF BIRTH:  12-03-1965  DATE OF ADMISSION:  08/30/2016 ADMITTING PHYSICIAN: Saundra Shelling, MD  DATE OF DISCHARGE: 09/03/2016  PRIMARY CARE PHYSICIAN: Richrd Humbles, MD    ADMISSION DIAGNOSIS:  Community acquired pneumonia of left upper lobe of lung ([place]) [J18.1]  DISCHARGE DIAGNOSIS:  Active Problems:   Pneumonia   SECONDARY DIAGNOSIS:   Past Medical History:  Diagnosis Date  . 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 COPD (chronic obstructive pulmonary disease) ([place])   . Diabetes mellitus without complication ([place])   . Hypertension   . 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   1. Acute on chronic hypoxic respiratory failure in the setting of left lobe pneumonia (failed outpatient therapy) She was initiated on cefepime and vancomycin. MRSA PCR was negative. Cefepime was continued.  Sputum culture showing gram-negative rods. She has been weaned  to baseline oxygen of  2-3 L.  she was evaluated by her pulmonologist. She will have follow-up with her pulmonologist on Tuesday.   2. Left midlung zone pneumonia: plan as outlined above. She will continue with Levaquin 750 mg for 3 more days. She will follow-up with pulmonary within that time frame.  She is having dark colored sputum however I did not appreciate great amount of blood in sputum..    3. COPD with exacerbation and chronic respiratory failure:  patient will continue on prednisone 50 mg for 3 days and stop. She will continue inhalers.    4. Diabetes: Patient may resume metformin with follow-up with  her PCP for diabetes. She'll continue on ADA diet   5. Sarcoidosis with pulmonary fibrosis: She'll follow-up with Dr. Raul Del on Tuesday.   6. Essential hypertension: Continue lisinopril  7. Hyperlipidemia: Continue pravastatin   DISCHARGE CONDITIONS AND DIET:   Patient is stable for discharge on diabetic diet  CONSULTS OBTAINED:  Treatment Team:  Erby Pian, MD  DRUG ALLERGIES:  No Known Allergies  DISCHARGE MEDICATIONS:   Current Discharge Medication List    START taking these medications   Details  feeding supplement, ENSURE ENLIVE, (ENSURE ENLIVE) LIQD Take 237 mLs by mouth daily. Qty: 237 mL, Refills: 12      CONTINUE these medications which have CHANGED   Details  levofloxacin (LEVAQUIN) 750 MG tablet Take 1 tablet (750 mg total) by mouth daily. Qty: 5 tablet, Refills: 0    predniSONE (DELTASONE) 50 MG tablet Take 1 tablet (50 mg total) by mouth daily with breakfast. Qty: 3 tablet, Refills: 0      CONTINUE these medications which have NOT CHANGED   Details  albuterol (PROVENTIL HFA;VENTOLIN HFA) 108 (90 BASE) MCG/ACT inhaler Inhale 2 puffs into the lungs every 6 (six) hours as needed for wheezing or shortness of breath.    albuterol (PROVENTIL) (2.5 MG/3ML) 0.083% nebulizer solution Inhale 2.5 mg into the lungs every 6 (six) hours as needed for wheezing or shortness of breath.     aspirin EC 325 MG tablet Take 325 mg by mouth daily.    budesonide (PULMICORT) 0.5 MG/2ML nebulizer solution Inhale 2 mLs (0.5 mg total) into the lungs every 12 (  twelve) hours. Qty: 120 mL, Refills: 0    cyclobenzaprine (FLEXERIL) 10 MG tablet Take 10 mg by mouth 3 (three) times daily as needed for muscle spasms.     gabapentin (NEURONTIN) 300 MG capsule Take 300 mg by mouth 3 (three) times daily.    hydrochlorothiazide (HYDRODIURIL) 25 MG tablet Take 25 mg by mouth daily.    lisinopril (PRINIVIL,ZESTRIL) 10 MG tablet Take 10 mg by mouth daily.     metFORMIN  (GLUCOPHAGE) 1000 MG tablet Take 1,000 mg by mouth 2 (two) times daily with a meal.    pravastatin (PRAVACHOL) 40 MG tablet Take 40 mg by mouth at bedtime.          Today   CHIEF COMPLAINT:  Patient had some dark sputum this morning. Was concerned there was blood. Sputum appears to be dark-colored however not streaky red blood   VITAL SIGNS:  Blood pressure 130/82, pulse 74, temperature 98.1 F (36.7 C), temperature source Oral, resp. rate 20, height _0  (1.803 m), weight 106.9 kg (235 lb 9.6 oz), last menstrual period 11/24/2014, SpO2 98 %.   REVIEW OF SYSTEMS:  Review of Systems  Constitutional: Negative.  Negative for chills, fever and malaise/fatigue.  HENT: Negative.  Negative for ear discharge, ear pain, hearing loss, nosebleeds and sore throat.   Eyes: Negative.  Negative for blurred vision and pain.  Respiratory: Positive for cough. Negative for hemoptysis, shortness of breath and wheezing.   Cardiovascular: Negative.  Negative for chest pain, palpitations and leg swelling.  Gastrointestinal: Negative.  Negative for abdominal pain, blood in stool, diarrhea, nausea and vomiting.  Genitourinary: Negative.  Negative for dysuria.  Musculoskeletal: Negative.  Negative for back pain.  Skin: Negative.   Neurological: Negative for dizziness, tremors, speech change, focal weakness, seizures and headaches.  Endo/Heme/Allergies: Negative.  Does not bruise/bleed easily.  Psychiatric/Behavioral: Negative.  Negative for depression, hallucinations and suicidal ideas.     PHYSICAL EXAMINATION:  GENERAL:  51 y.o.-year-old patient lying in the bed with no acute distress.  NECK:  Supple, no jugular venous distention. No thyroid enlargement, no tenderness.  LUNGS: Normal breath sounds bilaterally, no wheezing, rales,rhonchi  No use of accessory muscles of respiration.  CARDIOVASCULAR: S1, S2 normal. No murmurs, rubs, or gallops.  ABDOMEN: Soft, non-tender, non-distended. Bowel  sounds present. No organomegaly or mass.  EXTREMITIES: No pedal edema, cyanosis, or clubbing.  PSYCHIATRIC: The patient is alert and oriented x 3.  SKIN: No obvious rash, lesion, or ulcer.   DATA REVIEW:   CBC  Recent Labs Lab 08/31/16 0305  WBC 16.1*  HGB 10.6*  HCT 32.5*  PLT 409    Chemistries   Recent Labs Lab 08/30/16 0505 08/31/16 0305 09/02/16 1725  NA 135 137  --   K 4.1 3.9  --   CL 94* 97*  --   CO2 33* 34*  --   GLUCOSE 168* 112* 424*  BUN 25* 21*  --   CREATININE 0.85 0.91  --   CALCIUM 8.9 8.7*  --   AST 13*  --   --   ALT 9*  --   --   ALKPHOS 77  --   --   BILITOT 0.5  --   --     Cardiac Enzymes No results for input(s): TROPONINI in the last 168 hours.  Microbiology Results  _1 @  RADIOLOGY:  Dg Chest 1 View  Result Date: 09/01/2016 CLINICAL DATA:  Pneumonia, cough, shortness of breath and left-sided chest pain. EXAM:  CHEST 1 VIEW COMPARISON:  08/30/2016 and CT chest 08/25/2016. FINDINGS: Trachea is midline. Heart size is grossly stable. Opacification in the upper left hemithorax is unchanged. Left hemidiaphragm is elevated. Probable scarring at the right perihilar region and right lung base. No definite pleural fluid. IMPRESSION: Pulmonary parenchymal changes of sarcoid, as on 08/25/2016. Difficult to exclude superimposed pneumonia in the left lung. Electronically Signed   By: Lorin Picket M.D.   On: 09/01/2016 08:34      Current Discharge Medication List    START taking these medications   Details  feeding supplement, ENSURE ENLIVE, (ENSURE ENLIVE) LIQD Take 237 mLs by mouth daily. Qty: 237 mL, Refills: 12      CONTINUE these medications which have CHANGED   Details  levofloxacin (LEVAQUIN) 750 MG tablet Take 1 tablet (750 mg total) by mouth daily. Qty: 5 tablet, Refills: 0    predniSONE (DELTASONE) 50 MG tablet Take 1 tablet (50 mg total) by mouth daily with breakfast. Qty: 3 tablet, Refills: 0      CONTINUE these  medications which have NOT CHANGED   Details  albuterol (PROVENTIL HFA;VENTOLIN HFA) 108 (90 BASE) MCG/ACT inhaler Inhale 2 puffs into the lungs every 6 (six) hours as needed for wheezing or shortness of breath.    albuterol (PROVENTIL) (2.5 MG/3ML) 0.083% nebulizer solution Inhale 2.5 mg into the lungs every 6 (six) hours as needed for wheezing or shortness of breath.     aspirin EC 325 MG tablet Take 325 mg by mouth daily.    budesonide (PULMICORT) 0.5 MG/2ML nebulizer solution Inhale 2 mLs (0.5 mg total) into the lungs every 12 (twelve) hours. Qty: 120 mL, Refills: 0    cyclobenzaprine (FLEXERIL) 10 MG tablet Take 10 mg by mouth 3 (three) times daily as needed for muscle spasms.     gabapentin (NEURONTIN) 300 MG capsule Take 300 mg by mouth 3 (three) times daily.    hydrochlorothiazide (HYDRODIURIL) 25 MG tablet Take 25 mg by mouth daily.    lisinopril (PRINIVIL,ZESTRIL) 10 MG tablet Take 10 mg by mouth daily.     metFORMIN (GLUCOPHAGE) 1000 MG tablet Take 1,000 mg by mouth 2 (two) times daily with a meal.    pravastatin (PRAVACHOL) 40 MG tablet Take 40 mg by mouth at bedtime.          Management plans discussed with the patient and she is in agreement. Stable for discharge home  Patient should follow up with pcp pulmonary  CODE STATUS:     Code Status Orders        Start     Ordered   08/30/16 0726  Full code  Continuous     08/30/16 0725    Code Status History    Date Active Date Inactive Code Status Order ID Comments User Context   08/25/2016  5:10 PM 08/27/2016  2:28 PM Full Code [id number]  Nicholes Mango, MD Inpatient   12/24/2014  8:55 PM 12/26/2014  5:19 PM Full Code [id number]  Vaughan Basta, MD Inpatient      TOTAL TIME TAKING CARE OF THIS PATIENT: 38 minutes.    Note: This dictation was prepared with Dragon dictation along with smaller phrase technology. Any transcriptional errors that result from this process are unintentional.  Thompson Mckim M.D on  09/03/2016 at 8:07 AM  Between 7am to 6pm - Pager - [phone] After 6pm go to [email or web address] - password [email or web address] [facility]  Office  [phone]  CC: Primary care physician; [place],  Wynona Meals, MD

## 2016-09-03 NOTE — Progress Notes (Signed)
Pt's pain assessment documented in [facility]; pt ready for discharge; pt discharged via wheelchair on 3L O2 portable oxygen by nursing to the visitor's entrance

## 2016-09-04 LAB — CULTURE, BLOOD (ROUTINE X 2)
CULTURE: NO GROWTH
Culture: NO GROWTH
Special Requests: ADEQUATE

## 2016-09-04 LAB — CULTURE, RESPIRATORY W GRAM STAIN

## 2016-09-04 LAB — CULTURE, RESPIRATORY

## 2016-09-21 ENCOUNTER — Ambulatory Visit
Admission: RE | Admit: 2016-09-21 | Discharge: 2016-09-21 | Payer: MEDICARE | Attending: Family Medicine | Admitting: Family Medicine

## 2016-09-21 DIAGNOSIS — Z794 Long term (current) use of insulin: Secondary | ICD-10-CM

## 2016-09-21 DIAGNOSIS — B440 Invasive pulmonary aspergillosis: Secondary | ICD-10-CM

## 2016-09-21 DIAGNOSIS — E1165 Type 2 diabetes mellitus with hyperglycemia: Principal | ICD-10-CM

## 2016-09-21 MED ORDER — INSULIN GLARGINE (U-100) 100 UNIT/ML (3 ML) SUBCUTANEOUS PEN
PEN_INJECTOR | Freq: Every evening | SUBCUTANEOUS | 3 refills | 0 days | Status: SS
Start: 2016-09-21 — End: 2016-10-04

## 2016-09-21 MED ORDER — ALBUTEROL SULFATE CONCENTRATE 2.5 MG/0.5 ML SOLUTION FOR NEBULIZATION: 3 mg | each | Freq: Four times a day (QID) | 2 refills | 0 days | Status: AC

## 2016-09-21 MED ORDER — ALBUTEROL SULFATE HFA 90 MCG/ACTUATION AEROSOL INHALER
RESPIRATORY_TRACT | 4 refills | 0.00000 days | Status: CP | PRN
Start: 2016-09-21 — End: 2017-01-04

## 2016-09-21 MED ORDER — ALBUTEROL SULFATE CONCENTRATE 2.5 MG/0.5 ML SOLUTION FOR NEBULIZATION
Freq: Four times a day (QID) | RESPIRATORY_TRACT | 0 refills | 0.00000 days | Status: SS
Start: 2016-09-21 — End: 2016-09-26

## 2016-09-25 ENCOUNTER — Inpatient Hospital Stay
Admission: EM | Admit: 2016-09-25 | Discharge: 2016-10-04 | Disposition: A | Discharge status: Home Health | Payer: MEDICARE | Source: Intra-hospital

## 2016-09-25 ENCOUNTER — Inpatient Hospital Stay
Admission: EM | Admit: 2016-09-25 | Discharge: 2016-10-04 | Disposition: A | Discharge status: Home Health | Payer: MEDICARE | Source: Intra-hospital | Attending: Pulmonary Disease | Admitting: Pulmonary Disease

## 2016-09-25 DIAGNOSIS — N179 Acute kidney failure, unspecified: Principal | ICD-10-CM

## 2016-10-02 MED ORDER — MEROPENEM IVPB 1000 MG CONNECTOR BAG
Freq: Three times a day (TID) | INTRAVENOUS | 0 refills | 0 days | Status: CP
Start: 2016-10-02 — End: 2016-10-24

## 2016-10-02 MED ORDER — VORICONAZOLE 200 MG TABLET
ORAL_TABLET | Freq: Two times a day (BID) | ORAL | 3 refills | 0 days | Status: CP
Start: 2016-10-02 — End: 2016-10-25

## 2016-10-03 DIAGNOSIS — N179 Acute kidney failure, unspecified: Principal | ICD-10-CM

## 2016-10-04 MED ORDER — ALBUTEROL SULFATE 2.5 MG/3 ML (0.083 %) SOLUTION FOR NEBULIZATION
Freq: Four times a day (QID) | RESPIRATORY_TRACT | 0 refills | 0.00000 days | Status: CP | PRN
Start: 2016-10-04 — End: 2016-10-25

## 2016-10-04 MED ORDER — INSULIN GLARGINE (U-100) 100 UNIT/ML (3 ML) SUBCUTANEOUS PEN
PEN_INJECTOR | 0 refills | 0 days | Status: SS
Start: 2016-10-04 — End: 2017-02-19

## 2016-10-18 ENCOUNTER — Ambulatory Visit: Admission: RE | Admit: 2016-10-18 | Discharge: 2016-10-18 | Payer: MEDICARE | Attending: Family | Admitting: Family

## 2016-10-18 DIAGNOSIS — D869 Sarcoidosis, unspecified: Principal | ICD-10-CM

## 2016-10-18 DIAGNOSIS — B4489 Other forms of aspergillosis: Secondary | ICD-10-CM

## 2016-10-18 MED ORDER — IPRATROPIUM BROMIDE 0.02 % SOLUTION FOR INHALATION
Freq: Four times a day (QID) | RESPIRATORY_TRACT | 12 refills | 0 days | Status: CP
Start: 2016-10-18 — End: 2017-02-21

## 2016-10-18 MED ORDER — ALBUTEROL SULFATE 2.5 MG/3 ML (0.083 %) SOLUTION FOR NEBULIZATION
Freq: Four times a day (QID) | RESPIRATORY_TRACT | 2 refills | 0.00000 days | Status: CP | PRN
Start: 2016-10-18 — End: 2017-02-21

## 2016-10-25 ENCOUNTER — Ambulatory Visit
Admission: RE | Admit: 2016-10-25 | Discharge: 2016-10-25 | Payer: MEDICARE | Attending: Infectious Disease | Admitting: Infectious Disease

## 2016-10-25 DIAGNOSIS — J984 Other disorders of lung: Principal | ICD-10-CM

## 2016-10-25 DIAGNOSIS — B440 Invasive pulmonary aspergillosis: Secondary | ICD-10-CM

## 2016-10-25 MED ORDER — VORICONAZOLE 200 MG TABLET
ORAL_TABLET | Freq: Two times a day (BID) | ORAL | 3 refills | 0 days | Status: CP
Start: 2016-10-25 — End: 2017-04-19

## 2016-10-31 ENCOUNTER — Ambulatory Visit
Admission: RE | Admit: 2016-10-31 | Discharge: 2016-10-31 | Disposition: A | Discharge status: Home / Self Care | Payer: MEDICARE

## 2016-10-31 DIAGNOSIS — J189 Pneumonia, unspecified organism: Secondary | ICD-10-CM

## 2016-10-31 DIAGNOSIS — J984 Other disorders of lung: Principal | ICD-10-CM

## 2016-11-06 ENCOUNTER — Ambulatory Visit
Admission: RE | Admit: 2016-11-06 | Discharge: 2016-11-06 | Disposition: A | Discharge status: Home / Self Care | Payer: MEDICARE

## 2016-11-06 DIAGNOSIS — B440 Invasive pulmonary aspergillosis: Secondary | ICD-10-CM

## 2016-11-06 DIAGNOSIS — J984 Other disorders of lung: Principal | ICD-10-CM

## 2016-11-15 ENCOUNTER — Ambulatory Visit
Admission: RE | Admit: 2016-11-15 | Discharge: 2016-11-15 | Disposition: A | Discharge status: Home / Self Care | Payer: MEDICARE

## 2016-11-15 DIAGNOSIS — J984 Other disorders of lung: Principal | ICD-10-CM

## 2016-11-29 ENCOUNTER — Ambulatory Visit
Admission: RE | Admit: 2016-11-29 | Discharge: 2016-11-29 | Disposition: A | Discharge status: Home / Self Care | Payer: MEDICARE

## 2016-11-29 DIAGNOSIS — J984 Other disorders of lung: Principal | ICD-10-CM

## 2016-11-29 DIAGNOSIS — B440 Invasive pulmonary aspergillosis: Secondary | ICD-10-CM

## 2016-12-20 ENCOUNTER — Ambulatory Visit
Admission: RE | Admit: 2016-12-20 | Discharge: 2016-12-20 | Disposition: A | Discharge status: Home / Self Care | Payer: MEDICARE

## 2016-12-20 DIAGNOSIS — B440 Invasive pulmonary aspergillosis: Secondary | ICD-10-CM

## 2016-12-20 DIAGNOSIS — J984 Other disorders of lung: Principal | ICD-10-CM

## 2016-12-26 MED ORDER — LISINOPRIL 10 MG TABLET
ORAL_TABLET | Freq: Every day | ORAL | 1 refills | 0.00000 days | Status: SS
Start: 2016-12-26 — End: 2017-02-19

## 2017-01-03 ENCOUNTER — Ambulatory Visit
Admission: RE | Admit: 2017-01-03 | Discharge: 2017-01-03 | Disposition: A | Discharge status: Home / Self Care | Payer: MEDICARE

## 2017-01-03 DIAGNOSIS — B440 Invasive pulmonary aspergillosis: Secondary | ICD-10-CM

## 2017-01-03 DIAGNOSIS — J984 Other disorders of lung: Principal | ICD-10-CM

## 2017-01-04 ENCOUNTER — Ambulatory Visit: Admission: RE | Admit: 2017-01-04 | Discharge: 2017-01-04 | Disposition: A | Discharge status: Home / Self Care

## 2017-01-04 ENCOUNTER — Ambulatory Visit
Admission: RE | Admit: 2017-01-04 | Discharge: 2017-01-04 | Disposition: A | Discharge status: Home / Self Care | Payer: MEDICARE

## 2017-01-04 DIAGNOSIS — D869 Sarcoidosis, unspecified: Principal | ICD-10-CM

## 2017-01-04 DIAGNOSIS — D860 Sarcoidosis of lung: Principal | ICD-10-CM

## 2017-01-04 MED ORDER — ALBUTEROL SULFATE HFA 90 MCG/ACTUATION AEROSOL INHALER
RESPIRATORY_TRACT | 11 refills | 0 days | Status: CP | PRN
Start: 2017-01-04 — End: 2017-10-25

## 2017-01-30 MED ORDER — PRAVASTATIN 40 MG TABLET: tablet | 3 refills | 0 days

## 2017-01-30 MED ORDER — PRAVASTATIN 40 MG TABLET
ORAL_TABLET | Freq: Every day | ORAL | 3 refills | 0.00000 days | Status: CP
Start: 2017-01-30 — End: 2017-01-30

## 2017-01-31 ENCOUNTER — Ambulatory Visit
Admission: RE | Admit: 2017-01-31 | Discharge: 2017-01-31 | Disposition: A | Discharge status: Home / Self Care | Admitting: Infectious Disease

## 2017-01-31 DIAGNOSIS — I10 Essential (primary) hypertension: Secondary | ICD-10-CM

## 2017-01-31 DIAGNOSIS — E1165 Type 2 diabetes mellitus with hyperglycemia: Secondary | ICD-10-CM

## 2017-01-31 DIAGNOSIS — D869 Sarcoidosis, unspecified: Secondary | ICD-10-CM

## 2017-01-31 DIAGNOSIS — B440 Invasive pulmonary aspergillosis: Principal | ICD-10-CM

## 2017-01-31 DIAGNOSIS — B4489 Other forms of aspergillosis: Secondary | ICD-10-CM

## 2017-01-31 DIAGNOSIS — J984 Other disorders of lung: Secondary | ICD-10-CM

## 2017-01-31 MED ORDER — PRAVASTATIN 40 MG TABLET
ORAL_TABLET | Freq: Every day | ORAL | 3 refills | 0 days | Status: CP
Start: 2017-01-31 — End: 2017-02-21

## 2017-02-02 ENCOUNTER — Ambulatory Visit
Admission: RE | Admit: 2017-02-02 | Discharge: 2017-02-02 | Disposition: A | Discharge status: Home / Self Care | Payer: MEDICARE

## 2017-02-02 DIAGNOSIS — B440 Invasive pulmonary aspergillosis: Principal | ICD-10-CM

## 2017-02-06 ENCOUNTER — Ambulatory Visit: Admission: RE | Admit: 2017-02-06 | Discharge: 2017-02-06 | Payer: MEDICARE | Attending: Family | Admitting: Family

## 2017-02-06 DIAGNOSIS — J449 Chronic obstructive pulmonary disease, unspecified: Principal | ICD-10-CM

## 2017-02-06 DIAGNOSIS — B440 Invasive pulmonary aspergillosis: Secondary | ICD-10-CM

## 2017-02-06 DIAGNOSIS — I10 Essential (primary) hypertension: Secondary | ICD-10-CM

## 2017-02-06 DIAGNOSIS — E1165 Type 2 diabetes mellitus with hyperglycemia: Secondary | ICD-10-CM

## 2017-02-06 DIAGNOSIS — Z794 Long term (current) use of insulin: Secondary | ICD-10-CM

## 2017-02-19 ENCOUNTER — Observation Stay
Admission: EM | Admit: 2017-02-19 | Discharge: 2017-02-20 | Disposition: A | Discharge status: Home / Self Care | Payer: MEDICARE | Source: Intra-hospital

## 2017-02-19 DIAGNOSIS — R042 Hemoptysis: Principal | ICD-10-CM

## 2017-02-21 ENCOUNTER — Ambulatory Visit: Admission: RE | Admit: 2017-02-21 | Discharge: 2017-02-21

## 2017-02-21 DIAGNOSIS — D869 Sarcoidosis, unspecified: Principal | ICD-10-CM

## 2017-02-21 DIAGNOSIS — E1165 Type 2 diabetes mellitus with hyperglycemia: Secondary | ICD-10-CM

## 2017-02-21 DIAGNOSIS — I10 Essential (primary) hypertension: Secondary | ICD-10-CM

## 2017-02-21 DIAGNOSIS — J449 Chronic obstructive pulmonary disease, unspecified: Secondary | ICD-10-CM

## 2017-02-21 MED ORDER — HYDROCHLOROTHIAZIDE 25 MG TABLET
ORAL_TABLET | Freq: Every day | ORAL | 0 refills | 0.00000 days | Status: CP
Start: 2017-02-21 — End: 2017-05-03

## 2017-02-21 MED ORDER — ALBUTEROL SULFATE 2.5 MG/3 ML (0.083 %) SOLUTION FOR NEBULIZATION
Freq: Four times a day (QID) | RESPIRATORY_TRACT | 11 refills | 0 days | Status: CP | PRN
Start: 2017-02-21 — End: 2017-02-22

## 2017-02-21 MED ORDER — PRAVASTATIN 40 MG TABLET
ORAL_TABLET | Freq: Every day | ORAL | 1 refills | 0 days | Status: CP
Start: 2017-02-21 — End: 2017-10-25

## 2017-02-21 MED ORDER — BUDESONIDE 0.5 MG/2 ML SUSPENSION FOR NEBULIZATION
Freq: Two times a day (BID) | RESPIRATORY_TRACT | 2 refills | 0 days | Status: CP
Start: 2017-02-21 — End: 2017-12-06

## 2017-02-21 MED ORDER — IPRATROPIUM BROMIDE 0.02 % SOLUTION FOR INHALATION
Freq: Four times a day (QID) | RESPIRATORY_TRACT | 11 refills | 0 days | Status: CP
Start: 2017-02-21 — End: 2018-02-13

## 2017-02-22 MED ORDER — ALBUTEROL SULFATE 2.5 MG/3 ML (0.083 %) SOLUTION FOR NEBULIZATION
Freq: Four times a day (QID) | RESPIRATORY_TRACT | 5 refills | 0.00000 days | Status: CP | PRN
Start: 2017-02-22 — End: 2017-12-06

## 2017-03-06 ENCOUNTER — Encounter: Payer: Medicare Other | Attending: Internal Medicine

## 2017-03-06 ENCOUNTER — Other Ambulatory Visit: Payer: Self-pay

## 2017-03-06 VITALS — Ht 72.1 in | Wt 194.2 lb

## 2017-03-06 DIAGNOSIS — J449 Chronic obstructive pulmonary disease, unspecified: Secondary | ICD-10-CM | POA: Diagnosis present

## 2017-03-06 DIAGNOSIS — Z87891 Personal history of nicotine dependence: Secondary | ICD-10-CM | POA: Insufficient documentation

## 2017-03-06 DIAGNOSIS — D869 Sarcoidosis, unspecified: Secondary | ICD-10-CM | POA: Insufficient documentation

## 2017-03-06 DIAGNOSIS — D860 Sarcoidosis of lung: Secondary | ICD-10-CM

## 2017-03-06 NOTE — Patient Instructions (Signed)
Patient Instructions  Patient Details  Name: Charlotte Cross MRN: [id number] Date of Birth: 1965/12/22 Referring Provider:  Marisa Sprinkles, MD  Below are the personal goals you chose as well as exercise and nutrition goals. Our goal is to help you keep on track towards obtaining and maintaining your goals. We will be discussing your progress on these goals with you throughout the program.  Initial Exercise Prescription: Initial Exercise Prescription - 03/06/17 1600      Date of Initial Exercise RX and Referring Provider   Date  03/06/17    Referring Provider  Marlise Eves MD      Oxygen   Oxygen  Continuous    Liters  4      Treadmill   MPH  1.9    Grade  0.5    Minutes  15    METs  2.45      REL-XR   Level  2    Speed  50    Minutes  15    METs  3.2      T5 Nustep   Level  3    SPM  80    Minutes  15    METs  3.2      Prescription Details   Frequency (times per week)  3    Duration  Progress to 45 minutes of aerobic exercise without signs/symptoms of physical distress      Intensity   THRR 40-80% of Max Heartrate  12-153    Ratings of Perceived Exertion  11-13    Perceived Dyspnea  0-4      Progression   Progression  Continue to progress workloads to maintain intensity without signs/symptoms of physical distress.      Resistance Training   Training Prescription  Yes    Weight  3 lbs    Reps  10-15       Exercise Goals: Frequency: Be able to perform aerobic exercise three times per week working toward 3-5 days per week.  Intensity: Work with a perceived exertion of 11 (fairly light) - 15 (hard) as tolerated. Follow your new exercise prescription and watch for changes in prescription as you progress with the program. Changes will be reviewed with you when they are made.  Duration: You should be able to do 30 minutes of continuous aerobic exercise in addition to a 5 minute warm-up and a 5 minute cool-down routine.  Nutrition Goals: Your personal  nutrition goals will be established when you do your nutrition analysis with the dietician.  The following are nutrition guidelines to follow: Cholesterol < 235m/day Sodium < 15067mday Fiber: Women over 50 yrs - 21 grams per day  Personal Goals: Personal Goals and Risk Factors at Admission - 03/06/17 1515      Core Components/Risk Factors/Patient Goals on Admission    Weight Management  Weight Maintenance;Yes;Weight Loss she wants to tone    Intervention  Weight Management: Develop a combined nutrition and exercise program designed to reach desired caloric intake, while maintaining appropriate intake of nutrient and fiber, sodium and fats, and appropriate energy expenditure required for the weight goal.;Weight Management: Provide education and appropriate resources to help participant work on and attain dietary goals.    Admit Weight  194 lb 3.2 oz (88.1 kg)    Goal Weight: Short Term  190 lb (86.2 kg)    Goal Weight: Long Term  190 lb (86.2 kg)    Expected Outcomes  Short Term: Continue to assess and modify  interventions until short term weight is achieved;Long Term: Adherence to nutrition and physical activity/exercise program aimed toward attainment of established weight goal;Weight Loss: Understanding of general recommendations for a balanced deficit meal plan, which promotes 1-2 lb weight loss per week and includes a negative energy balance of [phone] kcal/d;Understanding of distribution of calorie intake throughout the day with the consumption of 4-5 meals/snacks;Understanding recommendations for meals to include 15-35% energy as protein, 25-35% energy from fat, 35-60% energy from carbohydrates, less than 286m of dietary cholesterol, 20-35 gm of total fiber daily    Improve shortness of breath with ADL's  Yes    Intervention  Provide education, individualized exercise plan and daily activity instruction to help decrease symptoms of SOB with activities of daily living.    Expected  Outcomes  Short Term: Achieves a reduction of symptoms when performing activities of daily living.    Diabetes  Yes    Intervention  Provide education about signs/symptoms and action to take for hypo/hyperglycemia.;Provide education about proper nutrition, including hydration, and aerobic/resistive exercise prescription along with prescribed medications to achieve blood glucose in normal ranges: Fasting glucose 65-99 mg/dL    Expected Outcomes  Short Term: Participant verbalizes understanding of the signs/symptoms and immediate care of hyper/hypoglycemia, proper foot care and importance of medication, aerobic/resistive exercise and nutrition plan for blood glucose control.;Long Term: Attainment of HbA1C < 7%.    Lipids  Yes    Intervention  Provide education and support for participant on nutrition & aerobic/resistive exercise along with prescribed medications to achieve LDL <723m HDL >4062m   Expected Outcomes  Short Term: Participant states understanding of desired cholesterol values and is compliant with medications prescribed. Participant is following exercise prescription and nutrition guidelines.;Long Term: Cholesterol controlled with medications as prescribed, with individualized exercise RX and with personalized nutrition plan. Value goals: LDL < 11m21mDL > 40 mg.       Tobacco Use Initial Evaluation: Social History   Tobacco Use  Smoking Status Former Smoker  . Packs/day: 0.25  . Years: 6.00  . Pack years: 1.50  . Types: Cigarettes  . Last attempt to quit: 03/21/1987  . Years since quitting: 29.9  Smokeless Tobacco Never Used    Exercise Goals and Review: Exercise Goals    Row Name 03/06/17 1614             Exercise Goals   Increase Physical Activity  Yes       Intervention  Provide advice, education, support and counseling about physical activity/exercise needs.;Develop an individualized exercise prescription for aerobic and resistive training based on initial evaluation  findings, risk stratification, comorbidities and participant's personal goals.       Expected Outcomes  Achievement of increased cardiorespiratory fitness and enhanced flexibility, muscular endurance and strength shown through measurements of functional capacity and personal statement of participant.       Increase Strength and Stamina  Yes       Intervention  Provide advice, education, support and counseling about physical activity/exercise needs.;Develop an individualized exercise prescription for aerobic and resistive training based on initial evaluation findings, risk stratification, comorbidities and participant's personal goals.       Expected Outcomes  Achievement of increased cardiorespiratory fitness and enhanced flexibility, muscular endurance and strength shown through measurements of functional capacity and personal statement of participant.       Able to understand and use rate of perceived exertion (RPE) scale  Yes       Intervention  Provide education and  explanation on how to use RPE scale       Expected Outcomes  Short Term: Able to use RPE daily in rehab to express subjective intensity level;Long Term:  Able to use RPE to guide intensity level when exercising independently       Able to understand and use Dyspnea scale  Yes       Intervention  Provide education and explanation on how to use Dyspnea scale       Expected Outcomes  Short Term: Able to use Dyspnea scale daily in rehab to express subjective sense of shortness of breath during exertion;Long Term: Able to use Dyspnea scale to guide intensity level when exercising independently       Knowledge and understanding of Target Heart Rate Range (THRR)  Yes       Intervention  Provide education and explanation of THRR including how the numbers were predicted and where they are located for reference       Expected Outcomes  Short Term: Able to state/look up THRR;Long Term: Able to use THRR to govern intensity when exercising  independently       Able to check pulse independently  Yes       Intervention  Provide education and demonstration on how to check pulse in carotid and radial arteries.;Review the importance of being able to check your own pulse for safety during independent exercise       Expected Outcomes  Short Term: Able to explain why pulse checking is important during independent exercise;Long Term: Able to check pulse independently and accurately       Understanding of Exercise Prescription  Yes       Intervention  Provide education, explanation, and written materials on patient's individual exercise prescription       Expected Outcomes  Short Term: Able to explain program exercise prescription;Long Term: Able to explain home exercise prescription to exercise independently          Copy of goals given to participant.

## 2017-03-06 NOTE — Progress Notes (Signed)
Pulmonary Individual Treatment Plan  Patient Details  Name: Charlotte Cross MRN: [id number] Date of Birth: Apr 16, 1965 Referring Provider:     Pulmonary Rehab from 03/06/2017 in [facility] Cardiac and Pulmonary Rehab  Referring Provider  Marlise Eves MD      Initial Encounter Date:    Pulmonary Rehab from 03/06/2017 in [facility] Cardiac and Pulmonary Rehab  Date  03/06/17  Referring Provider  Marlise Eves MD      Visit Diagnosis: Sarcoidosis of lung [facility])  Patient's Home Medications on Admission:  Current Outpatient Medications:  .  albuterol (PROVENTIL HFA;VENTOLIN HFA) 108 (90 BASE) MCG/ACT inhaler, Inhale 2 puffs into the lungs every 6 (six) hours as needed for wheezing or shortness of breath., Disp: , Rfl:  .  albuterol (PROVENTIL) (2.5 MG/3ML) 0.083% nebulizer solution, Inhale 2.5 mg into the lungs every 6 (six) hours as needed for wheezing or shortness of breath. , Disp: , Rfl:  .  aspirin EC 325 MG tablet, Take 325 mg by mouth daily., Disp: , Rfl:  .  budesonide (PULMICORT) 0.5 MG/2ML nebulizer solution, Inhale 2 mLs (0.5 mg total) into the lungs every 12 (twelve) hours., Disp: 120 mL, Rfl: 0 .  cyclobenzaprine (FLEXERIL) 10 MG tablet, Take 10 mg by mouth 3 (three) times daily as needed for muscle spasms. , Disp: , Rfl:  .  feeding supplement, ENSURE ENLIVE, (ENSURE ENLIVE) LIQD, Take 237 mLs by mouth daily., Disp: 237 mL, Rfl: 12 .  gabapentin (NEURONTIN) 300 MG capsule, Take 300 mg by mouth 3 (three) times daily., Disp: , Rfl:  .  hydrochlorothiazide (HYDRODIURIL) 25 MG tablet, Take 25 mg by mouth daily., Disp: , Rfl:  .  levofloxacin (LEVAQUIN) 750 MG tablet, Take 1 tablet (750 mg total) by mouth daily., Disp: 5 tablet, Rfl: 0 .  lisinopril (PRINIVIL,ZESTRIL) 10 MG tablet, Take 10 mg by mouth daily. , Disp: , Rfl:  .  metFORMIN (GLUCOPHAGE) 1000 MG tablet, Take 1,000 mg by mouth 2 (two) times daily with a meal., Disp: , Rfl:  .  pravastatin (PRAVACHOL) 40 MG tablet, Take 40 mg by  mouth at bedtime., Disp: , Rfl:   Past Medical History: Past Medical History:  Diagnosis Date  . 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 COPD (chronic obstructive pulmonary disease) ([place])   . Diabetes mellitus without complication ([place])   . Hypertension   . Sarcoidosis     Tobacco Use: Social History   Tobacco Use  Smoking Status Former Smoker  . Packs/day: 0.25  . Years: 6.00  . Pack years: 1.50  . Types: Cigarettes  . Last attempt to quit: 03/21/1987  . Years since quitting: 29.9  Smokeless Tobacco Never Used    Labs: Recent Review Scientist, physiological    Labs for ITP Cardiac and Pulmonary Rehab Latest Ref Rng & Units 12/24/2014 08/26/2016   Hemoglobin A1c 4.8 - 5.6 % - 12.3(H)   HCO3 21.0 - 28.0 mEq/L 29.2(H) -       Pulmonary Assessment Scores: Pulmonary Assessment Scores    Row Name 03/06/17 1503         ADL UCSD   ADL Phase  Entry     SOB Score total  56     Rest  0     Walk  2     Stairs  5     Bath  2     Dress  2     Shop  4       CAT Score   CAT Score  27       mMRC Score   mMRC Score  2        Pulmonary Function Assessment: Pulmonary Function Assessment - 03/06/17 1520      Breath   Bilateral Breath Sounds  Clear;Other LUL    Other  Right clear, left coarse.    Shortness of Breath  Yes;Limiting activity       Exercise Target Goals: Date: 03/06/17  Exercise Program Goal: Individual exercise prescription set with THRR, safety & activity barriers. Participant demonstrates ability to understand and report RPE using BORG scale, to self-measure pulse accurately, and to acknowledge the importance of the exercise prescription.  Exercise Prescription Goal: Starting with aerobic activity 30 plus minutes a day, 3 days per week for initial exercise prescription. Provide home exercise prescription and guidelines that participant acknowledges understanding prior to discharge.  Activity Barriers & Risk Stratification: Activity Barriers & Cardiac Risk Stratification -  03/06/17 1611      Activity Barriers & Cardiac Risk Stratification   Activity Barriers  Deconditioning;Muscular Weakness;Shortness of Breath       6 Minute Walk: 6 Minute Walk    Row Name 03/06/17 1603         6 Minute Walk   Phase  Initial     Distance  630 feet     Walk Time  3.6 minutes test stopped for desaturation     # of Rest Breaks  1 pt sat to rest at 3:36 and desaturated to 73%     MPH  1.99     METS  3.23     RPE  17     Perceived Dyspnea   3     VO2 Peak  11.29     Symptoms  Yes (comment)     Comments  SOB, fatigue     Resting HR  96 bpm     Resting BP  126/66     Resting Oxygen Saturation   91 %     Exercise Oxygen Saturation  during 6 min walk  73 %     Max Ex. HR  127 bpm     Max Ex. BP  146/84     2 Minute Post BP  134/64       Interval HR   1 Minute HR  115     2 Minute HR  125     3 Minute HR  127     4 Minute HR  123     5 Minute HR  112     6 Minute HR  98     2 Minute Post HR  93     Interval Heart Rate?  Yes       Interval Oxygen   Interval Oxygen?  Yes     Baseline Oxygen Saturation %  91 %     1 Minute Oxygen Saturation %  91 %     1 Minute Liters of Oxygen  3 L pulsed     2 Minute Oxygen Saturation %  82 % 2:34 80%     2 Minute Liters of Oxygen  3 L     3 Minute Oxygen Saturation %  86 % stopped at 3:36 81%     3 Minute Liters of Oxygen  3 L     4 Minute Oxygen Saturation %  73 % double checked with second pulse oximeter to verify     4 Minute Liters of Oxygen  3 L  5 Minute Oxygen Saturation %  73 %     5 Minute Liters of Oxygen  3 L     6 Minute Oxygen Saturation %  84 %     6 Minute Liters of Oxygen  3 L     2 Minute Post Oxygen Saturation %  88 % 9 min post 91%     2 Minute Post Liters of Oxygen  3 L       Oxygen Initial Assessment: Oxygen Initial Assessment - 03/06/17 1509      Home Oxygen   Home Oxygen Device  Home Concentrator;E-Tanks    Sleep Oxygen Prescription  Continuous    Liters per minute  2    Home  Exercise Oxygen Prescription  Continuous    Liters per minute  3    Home at Rest Exercise Oxygen Prescription  Continuous    Liters per minute  3    Compliance with Home Oxygen Use  Yes      Initial 6 min Walk   Oxygen Used  Pulsed    Liters per minute  3      Program Oxygen Prescription   Program Oxygen Prescription  Continuous    Liters per minute  3      Intervention   Short Term Goals  To learn and understand importance of monitoring SPO2 with pulse oximeter and demonstrate accurate use of the pulse oximeter.;To learn and demonstrate proper pursed lip breathing techniques or other breathing techniques.;To learn and demonstrate proper use of respiratory medications;To learn and understand importance of maintaining oxygen saturations>88%;To learn and exhibit compliance with exercise, home and travel O2 prescription Nebulizer at home, albuterol, atrovent. Pulmicort.    Long  Term Goals  Exhibits compliance with exercise, home and travel O2 prescription;Verbalizes importance of monitoring SPO2 with pulse oximeter and return demonstration;Maintenance of O2 saturations>88%;Compliance with respiratory medication;Exhibits proper breathing techniques, such as pursed lip breathing or other method taught during program session;Demonstrates proper use of MDI's       Oxygen Re-Evaluation:   Oxygen Discharge (Final Oxygen Re-Evaluation):   Initial Exercise Prescription: Initial Exercise Prescription - 03/06/17 1600      Date of Initial Exercise RX and Referring Provider   Date  03/06/17    Referring Provider  Marlise Eves MD      Oxygen   Oxygen  Continuous    Liters  4      Treadmill   MPH  1.9    Grade  0.5    Minutes  15    METs  2.45      REL-XR   Level  2    Speed  50    Minutes  15    METs  3.2      T5 Nustep   Level  3    SPM  80    Minutes  15    METs  3.2      Prescription Details   Frequency (times per week)  3    Duration  Progress to 45 minutes of aerobic  exercise without signs/symptoms of physical distress      Intensity   THRR 40-80% of Max Heartrate  12-153    Ratings of Perceived Exertion  11-13    Perceived Dyspnea  0-4      Progression   Progression  Continue to progress workloads to maintain intensity without signs/symptoms of physical distress.      Resistance Training   Training Prescription  Yes  Weight  3 lbs    Reps  10-15       Perform Capillary Blood Glucose checks as needed.  Exercise Prescription Changes: Exercise Prescription Changes    Row Name 03/06/17 1600             Response to Exercise   Blood Pressure (Admit)  126/66       Blood Pressure (Exercise)  146/84       Blood Pressure (Exit)  134/64       Heart Rate (Admit)  96 bpm       Heart Rate (Exercise)  127 bpm       Heart Rate (Exit)  94 bpm       Oxygen Saturation (Admit)  91 %       Oxygen Saturation (Exercise)  73 %       Oxygen Saturation (Exit)  91 %       Rating of Perceived Exertion (Exercise)  17       Perceived Dyspnea (Exercise)  3       Symptoms  SOB, fatigue       Comments  walk test results          Exercise Comments:   Exercise Goals and Review: Exercise Goals    Row Name 03/06/17 1614             Exercise Goals   Increase Physical Activity  Yes       Intervention  Provide advice, education, support and counseling about physical activity/exercise needs.;Develop an individualized exercise prescription for aerobic and resistive training based on initial evaluation findings, risk stratification, comorbidities and participant's personal goals.       Expected Outcomes  Achievement of increased cardiorespiratory fitness and enhanced flexibility, muscular endurance and strength shown through measurements of functional capacity and personal statement of participant.       Increase Strength and Stamina  Yes       Intervention  Provide advice, education, support and counseling about physical activity/exercise needs.;Develop an  individualized exercise prescription for aerobic and resistive training based on initial evaluation findings, risk stratification, comorbidities and participant's personal goals.       Expected Outcomes  Achievement of increased cardiorespiratory fitness and enhanced flexibility, muscular endurance and strength shown through measurements of functional capacity and personal statement of participant.       Able to understand and use rate of perceived exertion (RPE) scale  Yes       Intervention  Provide education and explanation on how to use RPE scale       Expected Outcomes  Short Term: Able to use RPE daily in rehab to express subjective intensity level;Long Term:  Able to use RPE to guide intensity level when exercising independently       Able to understand and use Dyspnea scale  Yes       Intervention  Provide education and explanation on how to use Dyspnea scale       Expected Outcomes  Short Term: Able to use Dyspnea scale daily in rehab to express subjective sense of shortness of breath during exertion;Long Term: Able to use Dyspnea scale to guide intensity level when exercising independently       Knowledge and understanding of Target Heart Rate Range (THRR)  Yes       Intervention  Provide education and explanation of THRR including how the numbers were predicted and where they are located for reference       Expected Outcomes  Short Term: Able to state/look up THRR;Long Term: Able to use THRR to govern intensity when exercising independently       Able to check pulse independently  Yes       Intervention  Provide education and demonstration on how to check pulse in carotid and radial arteries.;Review the importance of being able to check your own pulse for safety during independent exercise       Expected Outcomes  Short Term: Able to explain why pulse checking is important during independent exercise;Long Term: Able to check pulse independently and accurately       Understanding of Exercise  Prescription  Yes       Intervention  Provide education, explanation, and written materials on patient's individual exercise prescription       Expected Outcomes  Short Term: Able to explain program exercise prescription;Long Term: Able to explain home exercise prescription to exercise independently          Exercise Goals Re-Evaluation :   Discharge Exercise Prescription (Final Exercise Prescription Changes): Exercise Prescription Changes - 03/06/17 1600      Response to Exercise   Blood Pressure (Admit)  126/66    Blood Pressure (Exercise)  146/84    Blood Pressure (Exit)  134/64    Heart Rate (Admit)  96 bpm    Heart Rate (Exercise)  127 bpm    Heart Rate (Exit)  94 bpm    Oxygen Saturation (Admit)  91 %    Oxygen Saturation (Exercise)  73 %    Oxygen Saturation (Exit)  91 %    Rating of Perceived Exertion (Exercise)  17    Perceived Dyspnea (Exercise)  3    Symptoms  SOB, fatigue    Comments  walk test results       Nutrition:  Target Goals: Understanding of nutrition guidelines, daily intake of sodium <1548m, cholesterol <2047m calories 30% from fat and 7% or less from saturated fats, daily to have 5 or more servings of fruits and vegetables.  Biometrics: Pre Biometrics - 03/06/17 1615      Pre Biometrics   Height  6' 0.1" (1.831 m)    Weight  194 lb 3.2 oz (88.1 kg)    Waist Circumference  36.5 inches    Hip Circumference  39 inches    Waist to Hip Ratio  0.94 %    BMI (Calculated)  26.28        Nutrition Therapy Plan and Nutrition Goals: Nutrition Therapy & Goals - 03/06/17 1457      Nutrition Therapy   RD appointment defered  --      Personal Nutrition Goals   Comments  --      Intervention Plan   Intervention  Prescribe, educate and counsel regarding individualized specific dietary modifications aiming towards targeted core components such as weight, hypertension, lipid management, diabetes, heart failure and other comorbidities.;Nutrition handout(s)  given to patient.    Expected Outcomes  Short Term Goal: Understand basic principles of dietary content, such as calories, fat, sodium, cholesterol and nutrients.;Long Term Goal: Adherence to prescribed nutrition plan.       Nutrition Discharge: Rate Your Plate Scores: Nutrition Assessments - 03/06/17 1519      MEDFICTS Scores   Pre Score  57       Nutrition Goals Re-Evaluation:   Nutrition Goals Discharge (Final Nutrition Goals Re-Evaluation):   Psychosocial: Target Goals: Acknowledge presence or absence of significant depression and/or stress, maximize coping skills, provide positive support system.  Participant is able to verbalize types and ability to use techniques and skills needed for reducing stress and depression.   Initial Review & Psychosocial Screening: Initial Psych Review & Screening - 03/06/17 1456      Initial Review   Current issues with  Current Sleep Concerns      Family Dynamics   Good Support System?  Yes    Comments  Her brother, her mom and her boyfriend are good support systems.      Barriers   Psychosocial barriers to participate in program  The patient should benefit from training in stress management and relaxation.      Screening Interventions   Interventions  Encouraged to exercise;Program counselor consult;Provide feedback about the scores to participant;Yes;To provide support and resources with identified psychosocial needs    Expected Outcomes  Long Term goal: The participant improves quality of Life and PHQ9 Scores as seen by post scores and/or verbalization of changes;Short Term goal: Identification and review with participant of any Quality of Life or Depression concerns found by scoring the questionnaire.;Long Term Goal: Stressors or current issues are controlled or eliminated.;Short Term goal: Utilizing psychosocial counselor, staff and physician to assist with identification of specific Stressors or current issues interfering with healing  process. Setting desired goal for each stressor or current issue identified.       Quality of Life Scores:   PHQ-9: Recent Review Flowsheet Data    Depression screen [facility] 2/9 03/06/2017   Decreased Interest 0   Down, Depressed, Hopeless 0   PHQ - 2 Score 0   Altered sleeping 3   Tired, decreased energy 3   Change in appetite 2   Feeling bad or failure about yourself  0   Trouble concentrating 0   Moving slowly or fidgety/restless 0   Suicidal thoughts 0   PHQ-9 Score 8   Difficult doing work/chores Somewhat difficult     Interpretation of Total Score  Total Score Depression Severity:  1-4 = Minimal depression, 5-9 = Mild depression, 10-14 = Moderate depression, 15-19 = Moderately severe depression, 20-27 = Severe depression   Psychosocial Evaluation and Intervention:   Psychosocial Re-Evaluation:   Psychosocial Discharge (Final Psychosocial Re-Evaluation):   Education: Education Goals: Education classes will be provided on a weekly basis, covering required topics. Participant will state understanding/return demonstration of topics presented.  Learning Barriers/Preferences: Learning Barriers/Preferences - 03/06/17 1523      Learning Barriers/Preferences   Learning Barriers  None    Learning Preferences  None       Education Topics: Initial Evaluation Education: - Verbal, written and demonstration of respiratory meds, RPE/PD scales, oximetry and breathing techniques. Instruction on use of nebulizers and MDIs: cleaning and proper use, rinsing mouth with steroid doses and importance of monitoring MDI activations.   Pulmonary Rehab from 03/06/2017 in [facility] Cardiac and Pulmonary Rehab  Date  03/06/17  Educator  [facility]  Instruction Review Code  1- Verbalizes Understanding      General Nutrition Guidelines/Fats and Fiber: -Group instruction provided by verbal, written material, models and posters to present the general guidelines for heart healthy nutrition. Gives an  explanation and review of dietary fats and fiber.   Controlling Sodium/Reading Food Labels: -Group verbal and written material supporting the discussion of sodium use in heart healthy nutrition. Review and explanation with models, verbal and written materials for utilization of the food label.   Exercise Physiology & Risk Factors: - Group verbal and written instruction with models to review the exercise  physiology of the cardiovascular system and associated critical values. Details cardiovascular disease risk factors and the goals associated with each risk factor.   Aerobic Exercise & Resistance Training: - Gives group verbal and written discussion on the health impact of inactivity. On the components of aerobic and resistive training programs and the benefits of this training and how to safely progress through these programs.   Flexibility, Balance, General Exercise Guidelines: - Provides group verbal and written instruction on the benefits of flexibility and balance training programs. Provides general exercise guidelines with specific guidelines to those with heart or lung disease. Demonstration and skill practice provided.   Stress Management: - Provides group verbal and written instruction about the health risks of elevated stress, cause of high stress, and healthy ways to reduce stress.   Depression: - Provides group verbal and written instruction on the correlation between heart/lung disease and depressed mood, treatment options, and the stigmas associated with seeking treatment.   Exercise & Equipment Safety: - Individual verbal instruction and demonstration of equipment use and safety with use of the equipment.   Pulmonary Rehab from 03/06/2017 in [facility] Cardiac and Pulmonary Rehab  Date  03/06/17  Educator  [facility]  Instruction Review Code  1- Verbalizes Understanding      Infection Prevention: - Provides verbal and written material to individual with discussion of infection  control including proper hand washing and proper equipment cleaning during exercise session.   Pulmonary Rehab from 03/06/2017 in [facility] Cardiac and Pulmonary Rehab  Date  03/06/17  Educator  [facility]  Instruction Review Code  1- Verbalizes Understanding      Falls Prevention: - Provides verbal and written material to individual with discussion of falls prevention and safety.   Pulmonary Rehab from 03/06/2017 in [facility] Cardiac and Pulmonary Rehab  Date  03/06/17  Educator  [facility]  Instruction Review Code  1- Verbalizes Understanding      Diabetes: - Individual verbal and written instruction to review signs/symptoms of diabetes, desired ranges of glucose level fasting, after meals and with exercise. Advice that pre and post exercise glucose checks will be done for 3 sessions at entry of program.   Chronic Lung Diseases: - Group verbal and written instruction to review new updates, new respiratory medications, new advancements in procedures and treatments. Provide informative websites and "800" numbers of self-education.   Lung Procedures: - Group verbal and written instruction to describe testing methods done to diagnose lung disease. Review the outcome of test results. Describe the treatment choices: Pulmonary Function Tests, ABGs and oximetry.   Energy Conservation: - Provide group verbal and written instruction for methods to conserve energy, plan and organize activities. Instruct on pacing techniques, use of adaptive equipment and posture/positioning to relieve shortness of breath.   Triggers: - Group verbal and written instruction to review types of environmental controls: home humidity, furnaces, filters, dust mite/pet prevention, HEPA vacuums. To discuss weather changes, air quality and the benefits of nasal washing.   Exacerbations: - Group verbal and written instruction to provide: warning signs, infection symptoms, calling MD promptly, preventive modes, and value of vaccinations.  Review: effective airway clearance, coughing and/or vibration techniques. Create an Sports administrator.   Oxygen: - Individual and group verbal and written instruction on oxygen therapy. Includes supplement oxygen, available portable oxygen systems, continuous and intermittent flow rates, oxygen safety, concentrators, and Medicare reimbursement for oxygen.   Pulmonary Rehab from 03/06/2017 in [facility] Cardiac and Pulmonary Rehab  Date  03/06/17  Educator  [facility]  Instruction  Review Code  1- Verbalizes Understanding      Respiratory Medications: - Group verbal and written instruction to review medications for lung disease. Drug class, frequency, complications, importance of spacers, rinsing mouth after steroid MDI's, and proper cleaning methods for nebulizers.   AED/CPR: - Group verbal and written instruction with the use of models to demonstrate the basic use of the AED with the basic ABC's of resuscitation.   Breathing Retraining: - Provides individuals verbal and written instruction on purpose, frequency, and proper technique of diaphragmatic breathing and pursed-lipped breathing. Applies individual practice skills.   Pulmonary Rehab from 03/06/2017 in [facility] Cardiac and Pulmonary Rehab  Date  03/06/17  Educator  [facility]  Instruction Review Code  1- Verbalizes Understanding      Anatomy and Physiology of the Lungs: - Group verbal and written instruction with the use of models to provide basic lung anatomy and physiology related to function, structure and complications of lung disease.   Anatomy & Physiology of the Heart: - Group verbal and written instruction and models provide basic cardiac anatomy and physiology, with the coronary electrical and arterial systems. Review of: AMI, Angina, Valve disease, Heart Failure, Cardiac Arrhythmia, Pacemakers, and the ICD.   Heart Failure: - Group verbal and written instruction on the basics of heart failure: signs/symptoms, treatments, explanation of  ejection fraction, enlarged heart and cardiomyopathy.   Sleep Apnea: - Individual verbal and written instruction to review Obstructive Sleep Apnea. Review of risk factors, methods for diagnosing and types of masks and machines for OSA.   Anxiety: - Provides group, verbal and written instruction on the correlation between heart/lung disease and anxiety, treatment options, and management of anxiety.   Relaxation: - Provides group, verbal and written instruction about the benefits of relaxation for patients with heart/lung disease. Also provides patients with examples of relaxation techniques.   Cardiac Medications: - Group verbal and written instruction to review commonly prescribed medications for heart disease. Reviews the medication, class of the drug, and side effects.   Know Your Numbers: -Group verbal and written instruction about important numbers in your health.  Review of Cholesterol, Blood Pressure, Diabetes, and BMI and the role they play in your overall health.   Other: -Provides group and verbal instruction on various topics (see comments)    Knowledge Questionnaire Score: Knowledge Questionnaire Score - 03/06/17 1504      Knowledge Questionnaire Score   Pre Score  16/18 Reviewd with patient        Core Components/Risk Factors/Patient Goals at Admission: Personal Goals and Risk Factors at Admission - 03/06/17 1515      Core Components/Risk Factors/Patient Goals on Admission    Weight Management  Weight Maintenance;Yes;Weight Loss she wants to tone    Intervention  Weight Management: Develop a combined nutrition and exercise program designed to reach desired caloric intake, while maintaining appropriate intake of nutrient and fiber, sodium and fats, and appropriate energy expenditure required for the weight goal.;Weight Management: Provide education and appropriate resources to help participant work on and attain dietary goals.    Admit Weight  194 lb 3.2 oz (88.1  kg)    Goal Weight: Short Term  190 lb (86.2 kg)    Goal Weight: Long Term  190 lb (86.2 kg)    Expected Outcomes  Short Term: Continue to assess and modify interventions until short term weight is achieved;Long Term: Adherence to nutrition and physical activity/exercise program aimed toward attainment of established weight goal;Weight Loss: Understanding of general recommendations for  a balanced deficit meal plan, which promotes 1-2 lb weight loss per week and includes a negative energy balance of [phone] kcal/d;Understanding of distribution of calorie intake throughout the day with the consumption of 4-5 meals/snacks;Understanding recommendations for meals to include 15-35% energy as protein, 25-35% energy from fat, 35-60% energy from carbohydrates, less than 232m of dietary cholesterol, 20-35 gm of total fiber daily    Improve shortness of breath with ADL's  Yes    Intervention  Provide education, individualized exercise plan and daily activity instruction to help decrease symptoms of SOB with activities of daily living.    Expected Outcomes  Short Term: Achieves a reduction of symptoms when performing activities of daily living.    Diabetes  Yes    Intervention  Provide education about signs/symptoms and action to take for hypo/hyperglycemia.;Provide education about proper nutrition, including hydration, and aerobic/resistive exercise prescription along with prescribed medications to achieve blood glucose in normal ranges: Fasting glucose 65-99 mg/dL    Expected Outcomes  Short Term: Participant verbalizes understanding of the signs/symptoms and immediate care of hyper/hypoglycemia, proper foot care and importance of medication, aerobic/resistive exercise and nutrition plan for blood glucose control.;Long Term: Attainment of HbA1C < 7%.    Lipids  Yes    Intervention  Provide education and support for participant on nutrition & aerobic/resistive exercise along with prescribed medications to achieve  LDL <731m HDL >4062m   Expected Outcomes  Short Term: Participant states understanding of desired cholesterol values and is compliant with medications prescribed. Participant is following exercise prescription and nutrition guidelines.;Long Term: Cholesterol controlled with medications as prescribed, with individualized exercise RX and with personalized nutrition plan. Value goals: LDL < 28m36mDL > 40 mg.       Core Components/Risk Factors/Patient Goals Review:    Core Components/Risk Factors/Patient Goals at Discharge (Final Review):    ITP Comments: ITP Comments    Row Name 03/06/17 1435           ITP Comments  Medical Evaluation completed. Chart sent for review and changes to Dr. MarkEmily Filbertector [facility] [place] [facility]          Comments: Initial ITP

## 2017-03-07 ENCOUNTER — Ambulatory Visit
Admission: RE | Admit: 2017-03-07 | Discharge: 2017-03-07 | Disposition: A | Discharge status: Home / Self Care | Payer: MEDICARE

## 2017-03-07 DIAGNOSIS — B440 Invasive pulmonary aspergillosis: Secondary | ICD-10-CM

## 2017-03-07 DIAGNOSIS — J984 Other disorders of lung: Principal | ICD-10-CM

## 2017-03-16 ENCOUNTER — Ambulatory Visit: Payer: Medicare Other

## 2017-03-19 ENCOUNTER — Ambulatory Visit: Payer: Medicare Other

## 2017-03-21 ENCOUNTER — Ambulatory Visit: Payer: Medicare Other

## 2017-03-23 ENCOUNTER — Ambulatory Visit: Payer: Medicare Other

## 2017-03-26 ENCOUNTER — Telehealth: Payer: Self-pay

## 2017-03-26 ENCOUNTER — Ambulatory Visit: Payer: Medicare Other

## 2017-03-26 NOTE — Telephone Encounter (Signed)
Called Kennyth Lose to see if she was interested in resuming [facility]. Left message for patient

## 2017-03-28 ENCOUNTER — Ambulatory Visit: Payer: Medicare Other

## 2017-03-30 ENCOUNTER — Ambulatory Visit: Payer: Medicare Other

## 2017-04-02 ENCOUNTER — Ambulatory Visit: Payer: Medicare Other

## 2017-04-02 DIAGNOSIS — D860 Sarcoidosis of lung: Secondary | ICD-10-CM

## 2017-04-02 NOTE — Progress Notes (Signed)
Pulmonary Individual Treatment Plan  Patient Details  Name: Charlotte Cross MRN: [id number] Date of Birth: [birth year] Referring Provider:     Pulmonary Rehab from 03/06/2017 in [facility] Cardiac and Pulmonary Rehab  Referring Provider  Marlise Eves MD      Initial Encounter Date:    Pulmonary Rehab from 03/06/2017 in [facility] Cardiac and Pulmonary Rehab  Date  03/06/17  Referring Provider  Marlise Eves MD      Visit Diagnosis: Sarcoidosis of lung [facility])  Patient's Home Medications on Admission:  Current Outpatient Medications:  .  albuterol (PROVENTIL HFA;VENTOLIN HFA) 108 (90 BASE) MCG/ACT inhaler, Inhale 2 puffs into the lungs every 6 (six) hours as needed for wheezing or shortness of breath., Disp: , Rfl:  .  albuterol (PROVENTIL) (2.5 MG/3ML) 0.083% nebulizer solution, Inhale 2.5 mg into the lungs every 6 (six) hours as needed for wheezing or shortness of breath. , Disp: , Rfl:  .  aspirin EC 325 MG tablet, Take 325 mg by mouth daily., Disp: , Rfl:  .  budesonide (PULMICORT) 0.5 MG/2ML nebulizer solution, Inhale 2 mLs (0.5 mg total) into the lungs every 12 (twelve) hours., Disp: 120 mL, Rfl: 0 .  cyclobenzaprine (FLEXERIL) 10 MG tablet, Take 10 mg by mouth 3 (three) times daily as needed for muscle spasms. , Disp: , Rfl:  .  feeding supplement, ENSURE ENLIVE, (ENSURE ENLIVE) LIQD, Take 237 mLs by mouth daily., Disp: 237 mL, Rfl: 12 .  gabapentin (NEURONTIN) 300 MG capsule, Take 300 mg by mouth 3 (three) times daily., Disp: , Rfl:  .  hydrochlorothiazide (HYDRODIURIL) 25 MG tablet, Take 25 mg by mouth daily., Disp: , Rfl:  .  levofloxacin (LEVAQUIN) 750 MG tablet, Take 1 tablet (750 mg total) by mouth daily., Disp: 5 tablet, Rfl: 0 .  lisinopril (PRINIVIL,ZESTRIL) 10 MG tablet, Take 10 mg by mouth daily. , Disp: , Rfl:  .  metFORMIN (GLUCOPHAGE) 1000 MG tablet, Take 1,000 mg by mouth 2 (two) times daily with a meal., Disp: , Rfl:  .  pravastatin (PRAVACHOL) 40 MG tablet, Take 40 mg by  mouth at bedtime., Disp: , Rfl:   Past Medical History: Past Medical History:  Diagnosis Date  . 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 COPD (chronic obstructive pulmonary disease) ([place])   . Diabetes mellitus without complication ([place])   . Hypertension   . Sarcoidosis     Tobacco Use: Social History   Tobacco Use  Smoking Status Former Smoker  . Packs/day: 0.25  . Years: 6.00  . Pack years: 1.50  . Types: Cigarettes  . Last attempt to quit: 03/21/1987  . Years since quitting: 30.0  Smokeless Tobacco Never Used    Labs: Recent Review Flowsheet Data    Labs for ITP Cardiac and Pulmonary Rehab Latest Ref Rng & Units 12/24/2014 08/26/2016   Hemoglobin A1c 4.8 - 5.6 % - 12.3(H)   HCO3 21.0 - 28.0 mEq/L 29.2(H) -       Pulmonary Assessment Scores: Pulmonary Assessment Scores    Row Name 03/06/17 1503         ADL UCSD   ADL Phase  Entry     SOB Score total  56     Rest  0     Walk  2     Stairs  5     Bath  2     Dress  2     Shop  4       CAT Score   CAT Score  27       mMRC Score   mMRC Score  2        Pulmonary Function Assessment: Pulmonary Function Assessment - 03/06/17 1520      Breath   Bilateral Breath Sounds  Clear;Other LUL    Other  Right clear, left coarse.    Shortness of Breath  Yes;Limiting activity       Exercise Target Goals:    Exercise Program Goal: Individual exercise prescription set with THRR, safety & activity barriers. Participant demonstrates ability to understand and report RPE using BORG scale, to self-measure pulse accurately, and to acknowledge the importance of the exercise prescription.  Exercise Prescription Goal: Starting with aerobic activity 30 plus minutes a day, 3 days per week for initial exercise prescription. Provide home exercise prescription and guidelines that participant acknowledges understanding prior to discharge.  Activity Barriers & Risk Stratification: Activity Barriers & Cardiac Risk Stratification - 03/06/17 1611       Activity Barriers & Cardiac Risk Stratification   Activity Barriers  Deconditioning;Muscular Weakness;Shortness of Breath       6 Minute Walk: 6 Minute Walk    Row Name 03/06/17 1603         6 Minute Walk   Phase  Initial     Distance  630 feet     Walk Time  3.6 minutes test stopped for desaturation     # of Rest Breaks  1 pt sat to rest at 3:36 and desaturated to 73%     MPH  1.99     METS  3.23     RPE  17     Perceived Dyspnea   3     VO2 Peak  11.29     Symptoms  Yes (comment)     Comments  SOB, fatigue     Resting HR  96 bpm     Resting BP  126/66     Resting Oxygen Saturation   91 %     Exercise Oxygen Saturation  during 6 min walk  73 %     Max Ex. HR  127 bpm     Max Ex. BP  146/84     2 Minute Post BP  134/64       Interval HR   1 Minute HR  115     2 Minute HR  125     3 Minute HR  127     4 Minute HR  123     5 Minute HR  112     6 Minute HR  98     2 Minute Post HR  93     Interval Heart Rate?  Yes       Interval Oxygen   Interval Oxygen?  Yes     Baseline Oxygen Saturation %  91 %     1 Minute Oxygen Saturation %  91 %     1 Minute Liters of Oxygen  3 L pulsed     2 Minute Oxygen Saturation %  82 % 2:34 80%     2 Minute Liters of Oxygen  3 L     3 Minute Oxygen Saturation %  86 % stopped at 3:36 81%     3 Minute Liters of Oxygen  3 L     4 Minute Oxygen Saturation %  73 % double checked with second pulse oximeter to verify     4 Minute Liters of Oxygen  3 L  5 Minute Oxygen Saturation %  73 %     5 Minute Liters of Oxygen  3 L     6 Minute Oxygen Saturation %  84 %     6 Minute Liters of Oxygen  3 L     2 Minute Post Oxygen Saturation %  88 % 9 min post 91%     2 Minute Post Liters of Oxygen  3 L       Oxygen Initial Assessment: Oxygen Initial Assessment - 03/06/17 1509      Home Oxygen   Home Oxygen Device  Home Concentrator;E-Tanks    Sleep Oxygen Prescription  Continuous    Liters per minute  2    Home Exercise Oxygen  Prescription  Continuous    Liters per minute  3    Home at Rest Exercise Oxygen Prescription  Continuous    Liters per minute  3    Compliance with Home Oxygen Use  Yes      Initial 6 min Walk   Oxygen Used  Pulsed    Liters per minute  3      Program Oxygen Prescription   Program Oxygen Prescription  Continuous    Liters per minute  3      Intervention   Short Term Goals  To learn and understand importance of monitoring SPO2 with pulse oximeter and demonstrate accurate use of the pulse oximeter.;To learn and demonstrate proper pursed lip breathing techniques or other breathing techniques.;To learn and demonstrate proper use of respiratory medications;To learn and understand importance of maintaining oxygen saturations>88%;To learn and exhibit compliance with exercise, home and travel O2 prescription Nebulizer at home, albuterol, atrovent. Pulmicort.    Long  Term Goals  Exhibits compliance with exercise, home and travel O2 prescription;Verbalizes importance of monitoring SPO2 with pulse oximeter and return demonstration;Maintenance of O2 saturations>88%;Compliance with respiratory medication;Exhibits proper breathing techniques, such as pursed lip breathing or other method taught during program session;Demonstrates proper use of MDI's       Oxygen Re-Evaluation:   Oxygen Discharge (Final Oxygen Re-Evaluation):   Initial Exercise Prescription: Initial Exercise Prescription - 03/06/17 1600      Date of Initial Exercise RX and Referring Provider   Date  03/06/17    Referring Provider  Marlise Eves MD      Oxygen   Oxygen  Continuous    Liters  4      Treadmill   MPH  1.9    Grade  0.5    Minutes  15    METs  2.45      REL-XR   Level  2    Speed  50    Minutes  15    METs  3.2      T5 Nustep   Level  3    SPM  80    Minutes  15    METs  3.2      Prescription Details   Frequency (times per week)  3    Duration  Progress to 45 minutes of aerobic exercise  without signs/symptoms of physical distress      Intensity   THRR 40-80% of Max Heartrate  12-153    Ratings of Perceived Exertion  11-13    Perceived Dyspnea  0-4      Progression   Progression  Continue to progress workloads to maintain intensity without signs/symptoms of physical distress.      Resistance Training   Training Prescription  Yes  Weight  3 lbs    Reps  10-15       Perform Capillary Blood Glucose checks as needed.  Exercise Prescription Changes: Exercise Prescription Changes    Row Name 03/06/17 1600             Response to Exercise   Blood Pressure (Admit)  126/66       Blood Pressure (Exercise)  146/84       Blood Pressure (Exit)  134/64       Heart Rate (Admit)  96 bpm       Heart Rate (Exercise)  127 bpm       Heart Rate (Exit)  94 bpm       Oxygen Saturation (Admit)  91 %       Oxygen Saturation (Exercise)  73 %       Oxygen Saturation (Exit)  91 %       Rating of Perceived Exertion (Exercise)  17       Perceived Dyspnea (Exercise)  3       Symptoms  SOB, fatigue       Comments  walk test results          Exercise Comments:   Exercise Goals and Review: Exercise Goals    Row Name 03/06/17 1614             Exercise Goals   Increase Physical Activity  Yes       Intervention  Provide advice, education, support and counseling about physical activity/exercise needs.;Develop an individualized exercise prescription for aerobic and resistive training based on initial evaluation findings, risk stratification, comorbidities and participant's personal goals.       Expected Outcomes  Achievement of increased cardiorespiratory fitness and enhanced flexibility, muscular endurance and strength shown through measurements of functional capacity and personal statement of participant.       Increase Strength and Stamina  Yes       Intervention  Provide advice, education, support and counseling about physical activity/exercise needs.;Develop an  individualized exercise prescription for aerobic and resistive training based on initial evaluation findings, risk stratification, comorbidities and participant's personal goals.       Expected Outcomes  Achievement of increased cardiorespiratory fitness and enhanced flexibility, muscular endurance and strength shown through measurements of functional capacity and personal statement of participant.       Able to understand and use rate of perceived exertion (RPE) scale  Yes       Intervention  Provide education and explanation on how to use RPE scale       Expected Outcomes  Short Term: Able to use RPE daily in rehab to express subjective intensity level;Long Term:  Able to use RPE to guide intensity level when exercising independently       Able to understand and use Dyspnea scale  Yes       Intervention  Provide education and explanation on how to use Dyspnea scale       Expected Outcomes  Short Term: Able to use Dyspnea scale daily in rehab to express subjective sense of shortness of breath during exertion;Long Term: Able to use Dyspnea scale to guide intensity level when exercising independently       Knowledge and understanding of Target Heart Rate Range (THRR)  Yes       Intervention  Provide education and explanation of THRR including how the numbers were predicted and where they are located for reference       Expected Outcomes  Short Term: Able to state/look up THRR;Long Term: Able to use THRR to govern intensity when exercising independently       Able to check pulse independently  Yes       Intervention  Provide education and demonstration on how to check pulse in carotid and radial arteries.;Review the importance of being able to check your own pulse for safety during independent exercise       Expected Outcomes  Short Term: Able to explain why pulse checking is important during independent exercise;Long Term: Able to check pulse independently and accurately       Understanding of Exercise  Prescription  Yes       Intervention  Provide education, explanation, and written materials on patient's individual exercise prescription       Expected Outcomes  Short Term: Able to explain program exercise prescription;Long Term: Able to explain home exercise prescription to exercise independently          Exercise Goals Re-Evaluation :   Discharge Exercise Prescription (Final Exercise Prescription Changes): Exercise Prescription Changes - 03/06/17 1600      Response to Exercise   Blood Pressure (Admit)  126/66    Blood Pressure (Exercise)  146/84    Blood Pressure (Exit)  134/64    Heart Rate (Admit)  96 bpm    Heart Rate (Exercise)  127 bpm    Heart Rate (Exit)  94 bpm    Oxygen Saturation (Admit)  91 %    Oxygen Saturation (Exercise)  73 %    Oxygen Saturation (Exit)  91 %    Rating of Perceived Exertion (Exercise)  17    Perceived Dyspnea (Exercise)  3    Symptoms  SOB, fatigue    Comments  walk test results       Nutrition:  Target Goals: Understanding of nutrition guidelines, daily intake of sodium <1548m, cholesterol <2047m calories 30% from fat and 7% or less from saturated fats, daily to have 5 or more servings of fruits and vegetables.  Biometrics: Pre Biometrics - 03/06/17 1615      Pre Biometrics   Height  6' 0.1" (1.831 m)    Weight  194 lb 3.2 oz (88.1 kg)    Waist Circumference  36.5 inches    Hip Circumference  39 inches    Waist to Hip Ratio  0.94 %    BMI (Calculated)  26.28        Nutrition Therapy Plan and Nutrition Goals: Nutrition Therapy & Goals - 03/06/17 1457      Nutrition Therapy   RD appointment defered  --      Personal Nutrition Goals   Comments  --      Intervention Plan   Intervention  Prescribe, educate and counsel regarding individualized specific dietary modifications aiming towards targeted core components such as weight, hypertension, lipid management, diabetes, heart failure and other comorbidities.;Nutrition handout(s)  given to patient.    Expected Outcomes  Short Term Goal: Understand basic principles of dietary content, such as calories, fat, sodium, cholesterol and nutrients.;Long Term Goal: Adherence to prescribed nutrition plan.       Nutrition Discharge: Rate Your Plate Scores: Nutrition Assessments - 03/06/17 1519      MEDFICTS Scores   Pre Score  57       Nutrition Goals Re-Evaluation:   Nutrition Goals Discharge (Final Nutrition Goals Re-Evaluation):   Psychosocial: Target Goals: Acknowledge presence or absence of significant depression and/or stress, maximize coping skills, provide positive support system.  Participant is able to verbalize types and ability to use techniques and skills needed for reducing stress and depression.   Initial Review & Psychosocial Screening: Initial Psych Review & Screening - 03/06/17 1456      Initial Review   Current issues with  Current Sleep Concerns      Family Dynamics   Good Support System?  Yes    Comments  Her brother, her mom and her boyfriend are good support systems.      Barriers   Psychosocial barriers to participate in program  The patient should benefit from training in stress management and relaxation.      Screening Interventions   Interventions  Encouraged to exercise;Program counselor consult;Provide feedback about the scores to participant;Yes;To provide support and resources with identified psychosocial needs    Expected Outcomes  Long Term goal: The participant improves quality of Life and PHQ9 Scores as seen by post scores and/or verbalization of changes;Short Term goal: Identification and review with participant of any Quality of Life or Depression concerns found by scoring the questionnaire.;Long Term Goal: Stressors or current issues are controlled or eliminated.;Short Term goal: Utilizing psychosocial counselor, staff and physician to assist with identification of specific Stressors or current issues interfering with healing  process. Setting desired goal for each stressor or current issue identified.       Quality of Life Scores:   PHQ-9: Recent Review Flowsheet Data    Depression screen [facility] 2/9 03/06/2017   Decreased Interest 0   Down, Depressed, Hopeless 0   PHQ - 2 Score 0   Altered sleeping 3   Tired, decreased energy 3   Change in appetite 2   Feeling bad or failure about yourself  0   Trouble concentrating 0   Moving slowly or fidgety/restless 0   Suicidal thoughts 0   PHQ-9 Score 8   Difficult doing work/chores Somewhat difficult     Interpretation of Total Score  Total Score Depression Severity:  1-4 = Minimal depression, 5-9 = Mild depression, 10-14 = Moderate depression, 15-19 = Moderately severe depression, 20-27 = Severe depression   Psychosocial Evaluation and Intervention:   Psychosocial Re-Evaluation:   Psychosocial Discharge (Final Psychosocial Re-Evaluation):   Education: Education Goals: Education classes will be provided on a weekly basis, covering required topics. Participant will state understanding/return demonstration of topics presented.  Learning Barriers/Preferences: Learning Barriers/Preferences - 03/06/17 1523      Learning Barriers/Preferences   Learning Barriers  None    Learning Preferences  None       Education Topics: Initial Evaluation Education: - Verbal, written and demonstration of respiratory meds, RPE/PD scales, oximetry and breathing techniques. Instruction on use of nebulizers and MDIs: cleaning and proper use, rinsing mouth with steroid doses and importance of monitoring MDI activations.   Pulmonary Rehab from 03/06/2017 in [facility] Cardiac and Pulmonary Rehab  Date  03/06/17  Educator  [facility]  Instruction Review Code  1- Verbalizes Understanding      General Nutrition Guidelines/Fats and Fiber: -Group instruction provided by verbal, written material, models and posters to present the general guidelines for heart healthy nutrition. Gives an  explanation and review of dietary fats and fiber.   Controlling Sodium/Reading Food Labels: -Group verbal and written material supporting the discussion of sodium use in heart healthy nutrition. Review and explanation with models, verbal and written materials for utilization of the food label.   Exercise Physiology & Risk Factors: - Group verbal and written instruction with models to review the exercise  physiology of the cardiovascular system and associated critical values. Details cardiovascular disease risk factors and the goals associated with each risk factor.   Aerobic Exercise & Resistance Training: - Gives group verbal and written discussion on the health impact of inactivity. On the components of aerobic and resistive training programs and the benefits of this training and how to safely progress through these programs.   Flexibility, Balance, General Exercise Guidelines: - Provides group verbal and written instruction on the benefits of flexibility and balance training programs. Provides general exercise guidelines with specific guidelines to those with heart or lung disease. Demonstration and skill practice provided.   Stress Management: - Provides group verbal and written instruction about the health risks of elevated stress, cause of high stress, and healthy ways to reduce stress.   Depression: - Provides group verbal and written instruction on the correlation between heart/lung disease and depressed mood, treatment options, and the stigmas associated with seeking treatment.   Exercise & Equipment Safety: - Individual verbal instruction and demonstration of equipment use and safety with use of the equipment.   Pulmonary Rehab from 03/06/2017 in [facility] Cardiac and Pulmonary Rehab  Date  03/06/17  Educator  [facility]  Instruction Review Code  1- Verbalizes Understanding      Infection Prevention: - Provides verbal and written material to individual with discussion of infection  control including proper hand washing and proper equipment cleaning during exercise session.   Pulmonary Rehab from 03/06/2017 in [facility] Cardiac and Pulmonary Rehab  Date  03/06/17  Educator  [facility]  Instruction Review Code  1- Verbalizes Understanding      Falls Prevention: - Provides verbal and written material to individual with discussion of falls prevention and safety.   Pulmonary Rehab from 03/06/2017 in [facility] Cardiac and Pulmonary Rehab  Date  03/06/17  Educator  [facility]  Instruction Review Code  1- Verbalizes Understanding      Diabetes: - Individual verbal and written instruction to review signs/symptoms of diabetes, desired ranges of glucose level fasting, after meals and with exercise. Advice that pre and post exercise glucose checks will be done for 3 sessions at entry of program.   Chronic Lung Diseases: - Group verbal and written instruction to review new updates, new respiratory medications, new advancements in procedures and treatments. Provide informative websites and "800" numbers of self-education.   Lung Procedures: - Group verbal and written instruction to describe testing methods done to diagnose lung disease. Review the outcome of test results. Describe the treatment choices: Pulmonary Function Tests, ABGs and oximetry.   Energy Conservation: - Provide group verbal and written instruction for methods to conserve energy, plan and organize activities. Instruct on pacing techniques, use of adaptive equipment and posture/positioning to relieve shortness of breath.   Triggers: - Group verbal and written instruction to review types of environmental controls: home humidity, furnaces, filters, dust mite/pet prevention, HEPA vacuums. To discuss weather changes, air quality and the benefits of nasal washing.   Exacerbations: - Group verbal and written instruction to provide: warning signs, infection symptoms, calling MD promptly, preventive modes, and value of vaccinations.  Review: effective airway clearance, coughing and/or vibration techniques. Create an Sports administrator.   Oxygen: - Individual and group verbal and written instruction on oxygen therapy. Includes supplement oxygen, available portable oxygen systems, continuous and intermittent flow rates, oxygen safety, concentrators, and Medicare reimbursement for oxygen.   Pulmonary Rehab from 03/06/2017 in [facility] Cardiac and Pulmonary Rehab  Date  03/06/17  Educator  [facility]  Instruction  Review Code  1- Verbalizes Understanding      Respiratory Medications: - Group verbal and written instruction to review medications for lung disease. Drug class, frequency, complications, importance of spacers, rinsing mouth after steroid MDI's, and proper cleaning methods for nebulizers.   AED/CPR: - Group verbal and written instruction with the use of models to demonstrate the basic use of the AED with the basic ABC's of resuscitation.   Breathing Retraining: - Provides individuals verbal and written instruction on purpose, frequency, and proper technique of diaphragmatic breathing and pursed-lipped breathing. Applies individual practice skills.   Pulmonary Rehab from 03/06/2017 in [facility] Cardiac and Pulmonary Rehab  Date  03/06/17  Educator  [facility]  Instruction Review Code  1- Verbalizes Understanding      Anatomy and Physiology of the Lungs: - Group verbal and written instruction with the use of models to provide basic lung anatomy and physiology related to function, structure and complications of lung disease.   Anatomy & Physiology of the Heart: - Group verbal and written instruction and models provide basic cardiac anatomy and physiology, with the coronary electrical and arterial systems. Review of: AMI, Angina, Valve disease, Heart Failure, Cardiac Arrhythmia, Pacemakers, and the ICD.   Heart Failure: - Group verbal and written instruction on the basics of heart failure: signs/symptoms, treatments, explanation of  ejection fraction, enlarged heart and cardiomyopathy.   Sleep Apnea: - Individual verbal and written instruction to review Obstructive Sleep Apnea. Review of risk factors, methods for diagnosing and types of masks and machines for OSA.   Anxiety: - Provides group, verbal and written instruction on the correlation between heart/lung disease and anxiety, treatment options, and management of anxiety.   Relaxation: - Provides group, verbal and written instruction about the benefits of relaxation for patients with heart/lung disease. Also provides patients with examples of relaxation techniques.   Cardiac Medications: - Group verbal and written instruction to review commonly prescribed medications for heart disease. Reviews the medication, class of the drug, and side effects.   Know Your Numbers: -Group verbal and written instruction about important numbers in your health.  Review of Cholesterol, Blood Pressure, Diabetes, and BMI and the role they play in your overall health.   Other: -Provides group and verbal instruction on various topics (see comments)    Knowledge Questionnaire Score: Knowledge Questionnaire Score - 03/06/17 1504      Knowledge Questionnaire Score   Pre Score  16/18 Reviewd with patient        Core Components/Risk Factors/Patient Goals at Admission: Personal Goals and Risk Factors at Admission - 03/06/17 1515      Core Components/Risk Factors/Patient Goals on Admission    Weight Management  Weight Maintenance;Yes;Weight Loss she wants to tone    Intervention  Weight Management: Develop a combined nutrition and exercise program designed to reach desired caloric intake, while maintaining appropriate intake of nutrient and fiber, sodium and fats, and appropriate energy expenditure required for the weight goal.;Weight Management: Provide education and appropriate resources to help participant work on and attain dietary goals.    Admit Weight  194 lb 3.2 oz (88.1  kg)    Goal Weight: Short Term  190 lb (86.2 kg)    Goal Weight: Long Term  190 lb (86.2 kg)    Expected Outcomes  Short Term: Continue to assess and modify interventions until short term weight is achieved;Long Term: Adherence to nutrition and physical activity/exercise program aimed toward attainment of established weight goal;Weight Loss: Understanding of general recommendations for  a balanced deficit meal plan, which promotes 1-2 lb weight loss per week and includes a negative energy balance of [phone] kcal/d;Understanding of distribution of calorie intake throughout the day with the consumption of 4-5 meals/snacks;Understanding recommendations for meals to include 15-35% energy as protein, 25-35% energy from fat, 35-60% energy from carbohydrates, less than 261m of dietary cholesterol, 20-35 gm of total fiber daily    Improve shortness of breath with ADL's  Yes    Intervention  Provide education, individualized exercise plan and daily activity instruction to help decrease symptoms of SOB with activities of daily living.    Expected Outcomes  Short Term: Achieves a reduction of symptoms when performing activities of daily living.    Diabetes  Yes    Intervention  Provide education about signs/symptoms and action to take for hypo/hyperglycemia.;Provide education about proper nutrition, including hydration, and aerobic/resistive exercise prescription along with prescribed medications to achieve blood glucose in normal ranges: Fasting glucose 65-99 mg/dL    Expected Outcomes  Short Term: Participant verbalizes understanding of the signs/symptoms and immediate care of hyper/hypoglycemia, proper foot care and importance of medication, aerobic/resistive exercise and nutrition plan for blood glucose control.;Long Term: Attainment of HbA1C < 7%.    Lipids  Yes    Intervention  Provide education and support for participant on nutrition & aerobic/resistive exercise along with prescribed medications to achieve  LDL <74m HDL >4064m   Expected Outcomes  Short Term: Participant states understanding of desired cholesterol values and is compliant with medications prescribed. Participant is following exercise prescription and nutrition guidelines.;Long Term: Cholesterol controlled with medications as prescribed, with individualized exercise RX and with personalized nutrition plan. Value goals: LDL < 4m42mDL > 40 mg.       Core Components/Risk Factors/Patient Goals Review:    Core Components/Risk Factors/Patient Goals at Discharge (Final Review):    ITP Comments: ITP Comments    Row Name 03/06/17 1435 04/02/17 0813 04/02/17 0814       ITP Comments  Medical Evaluation completed. Chart sent for review and changes to [place]. [facility] [place]  patient has not attended since evaluation  30 day review completed. ITP sent to Dr. [facility]ily [facility] unless changes are made by physician.        Comments: 30 day review

## 2017-04-04 ENCOUNTER — Ambulatory Visit: Payer: Medicare Other

## 2017-04-06 ENCOUNTER — Ambulatory Visit: Payer: Medicare Other

## 2017-04-09 ENCOUNTER — Telehealth: Payer: Self-pay

## 2017-04-09 ENCOUNTER — Ambulatory Visit: Payer: Medicare Other

## 2017-04-09 DIAGNOSIS — D860 Sarcoidosis of lung: Secondary | ICD-10-CM

## 2017-04-09 NOTE — Telephone Encounter (Signed)
Charlotte Cross stated she has not been in class due to the flu. She states she will be back on Wednesday the 23rd.

## 2017-04-11 ENCOUNTER — Ambulatory Visit: Payer: Medicare Other

## 2017-04-13 ENCOUNTER — Ambulatory Visit: Payer: Medicare Other

## 2017-04-16 ENCOUNTER — Ambulatory Visit: Payer: Medicare Other

## 2017-04-18 ENCOUNTER — Other Ambulatory Visit: Admit: 2017-04-18 | Discharge: 2017-04-19 | Payer: MEDICARE

## 2017-04-18 ENCOUNTER — Ambulatory Visit: Payer: Medicare Other

## 2017-04-18 DIAGNOSIS — B440 Invasive pulmonary aspergillosis: Principal | ICD-10-CM

## 2017-04-19 DIAGNOSIS — D860 Sarcoidosis of lung: Secondary | ICD-10-CM

## 2017-04-19 MED ORDER — VORICONAZOLE 200 MG TABLET
ORAL_TABLET | Freq: Two times a day (BID) | ORAL | 3 refills | 0.00000 days | Status: CP
Start: 2017-04-19 — End: 2017-10-25

## 2017-04-19 NOTE — Progress Notes (Signed)
Pulmonary Individual Treatment Plan  Patient Details  Name: Charlotte Cross MRN: [id number] Date of Birth: [birth year] Referring Provider:     Pulmonary Rehab from 03/06/2017 in [facility] Cardiac and Pulmonary Rehab  Referring Provider  Marlise Eves MD      Initial Encounter Date:    Pulmonary Rehab from 03/06/2017 in [facility] Cardiac and Pulmonary Rehab  Date  03/06/17  Referring Provider  Marlise Eves MD      Visit Diagnosis: Sarcoidosis of lung [facility])  Patient's Home Medications on Admission:  Current Outpatient Medications:  .  albuterol (PROVENTIL HFA;VENTOLIN HFA) 108 (90 BASE) MCG/ACT inhaler, Inhale 2 puffs into the lungs every 6 (six) hours as needed for wheezing or shortness of breath., Disp: , Rfl:  .  albuterol (PROVENTIL) (2.5 MG/3ML) 0.083% nebulizer solution, Inhale 2.5 mg into the lungs every 6 (six) hours as needed for wheezing or shortness of breath. , Disp: , Rfl:  .  aspirin EC 325 MG tablet, Take 325 mg by mouth daily., Disp: , Rfl:  .  budesonide (PULMICORT) 0.5 MG/2ML nebulizer solution, Inhale 2 mLs (0.5 mg total) into the lungs every 12 (twelve) hours., Disp: 120 mL, Rfl: 0 .  cyclobenzaprine (FLEXERIL) 10 MG tablet, Take 10 mg by mouth 3 (three) times daily as needed for muscle spasms. , Disp: , Rfl:  .  feeding supplement, ENSURE ENLIVE, (ENSURE ENLIVE) LIQD, Take 237 mLs by mouth daily., Disp: 237 mL, Rfl: 12 .  gabapentin (NEURONTIN) 300 MG capsule, Take 300 mg by mouth 3 (three) times daily., Disp: , Rfl:  .  hydrochlorothiazide (HYDRODIURIL) 25 MG tablet, Take 25 mg by mouth daily., Disp: , Rfl:  .  levofloxacin (LEVAQUIN) 750 MG tablet, Take 1 tablet (750 mg total) by mouth daily., Disp: 5 tablet, Rfl: 0 .  lisinopril (PRINIVIL,ZESTRIL) 10 MG tablet, Take 10 mg by mouth daily. , Disp: , Rfl:  .  metFORMIN (GLUCOPHAGE) 1000 MG tablet, Take 1,000 mg by mouth 2 (two) times daily with a meal., Disp: , Rfl:  .  pravastatin (PRAVACHOL) 40 MG tablet, Take 40 mg by  mouth at bedtime., Disp: , Rfl:   Past Medical History: Past Medical History:  Diagnosis Date  . 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 COPD (chronic obstructive pulmonary disease) ([place])   . Diabetes mellitus without complication ([place])   . Hypertension   . Sarcoidosis     Tobacco Use: Social History   Tobacco Use  Smoking Status Former Smoker  . Packs/day: 0.25  . Years: 6.00  . Pack years: 1.50  . Types: Cigarettes  . Last attempt to quit: 03/21/1987  . Years since quitting: 30.1  Smokeless Tobacco Never Used    Labs: Recent Review Scientist, physiological    Labs for ITP Cardiac and Pulmonary Rehab Latest Ref Rng & Units 12/24/2014 08/26/2016   Hemoglobin A1c 4.8 - 5.6 % - 12.3(H)   HCO3 21.0 - 28.0 mEq/L 29.2(H) -       Pulmonary Assessment Scores: Pulmonary Assessment Scores    Row Name 03/06/17 1503         ADL UCSD   ADL Phase  Entry     SOB Score total  56     Rest  0     Walk  2     Stairs  5     Bath  2     Dress  2     Shop  4       CAT Score   CAT Score  27       mMRC Score   mMRC Score  2        Pulmonary Function Assessment: Pulmonary Function Assessment - 03/06/17 1520      Breath   Bilateral Breath Sounds  Clear;Other LUL    Other  Right clear, left coarse.    Shortness of Breath  Yes;Limiting activity       Exercise Target Goals:    Exercise Program Goal: Individual exercise prescription set using results from initial 6 min walk test and THRR while considering  patient's activity barriers and safety.    Exercise Prescription Goal: Initial exercise prescription builds to 30-45 minutes a day of aerobic activity, 2-3 days per week.  Home exercise guidelines will be given to patient during program as part of exercise prescription that the participant will acknowledge.  Activity Barriers & Risk Stratification: Activity Barriers & Cardiac Risk Stratification - 03/06/17 1611      Activity Barriers & Cardiac Risk Stratification   Activity Barriers   Deconditioning;Muscular Weakness;Shortness of Breath       6 Minute Walk: 6 Minute Walk    Row Name 03/06/17 1603         6 Minute Walk   Phase  Initial     Distance  630 feet     Walk Time  3.6 minutes test stopped for desaturation     # of Rest Breaks  1 pt sat to rest at 3:36 and desaturated to 73%     MPH  1.99     METS  3.23     RPE  17     Perceived Dyspnea   3     VO2 Peak  11.29     Symptoms  Yes (comment)     Comments  SOB, fatigue     Resting HR  96 bpm     Resting BP  126/66     Resting Oxygen Saturation   91 %     Exercise Oxygen Saturation  during 6 min walk  73 %     Max Ex. HR  127 bpm     Max Ex. BP  146/84     2 Minute Post BP  134/64       Interval HR   1 Minute HR  115     2 Minute HR  125     3 Minute HR  127     4 Minute HR  123     5 Minute HR  112     6 Minute HR  98     2 Minute Post HR  93     Interval Heart Rate?  Yes       Interval Oxygen   Interval Oxygen?  Yes     Baseline Oxygen Saturation %  91 %     1 Minute Oxygen Saturation %  91 %     1 Minute Liters of Oxygen  3 L pulsed     2 Minute Oxygen Saturation %  82 % 2:34 80%     2 Minute Liters of Oxygen  3 L     3 Minute Oxygen Saturation %  86 % stopped at 3:36 81%     3 Minute Liters of Oxygen  3 L     4 Minute Oxygen Saturation %  73 % double checked with second pulse oximeter to verify     4 Minute Liters of Oxygen  3 L  5 Minute Oxygen Saturation %  73 %     5 Minute Liters of Oxygen  3 L     6 Minute Oxygen Saturation %  84 %     6 Minute Liters of Oxygen  3 L     2 Minute Post Oxygen Saturation %  88 % 9 min post 91%     2 Minute Post Liters of Oxygen  3 L       Oxygen Initial Assessment: Oxygen Initial Assessment - 03/06/17 1509      Home Oxygen   Home Oxygen Device  Home Concentrator;E-Tanks    Sleep Oxygen Prescription  Continuous    Liters per minute  2    Home Exercise Oxygen Prescription  Continuous    Liters per minute  3    Home at Rest Exercise Oxygen  Prescription  Continuous    Liters per minute  3    Compliance with Home Oxygen Use  Yes      Initial 6 min Walk   Oxygen Used  Pulsed    Liters per minute  3      Program Oxygen Prescription   Program Oxygen Prescription  Continuous    Liters per minute  3      Intervention   Short Term Goals  To learn and understand importance of monitoring SPO2 with pulse oximeter and demonstrate accurate use of the pulse oximeter.;To learn and demonstrate proper pursed lip breathing techniques or other breathing techniques.;To learn and demonstrate proper use of respiratory medications;To learn and understand importance of maintaining oxygen saturations>88%;To learn and exhibit compliance with exercise, home and travel O2 prescription Nebulizer at home, albuterol, atrovent. Pulmicort.    Long  Term Goals  Exhibits compliance with exercise, home and travel O2 prescription;Verbalizes importance of monitoring SPO2 with pulse oximeter and return demonstration;Maintenance of O2 saturations>88%;Compliance with respiratory medication;Exhibits proper breathing techniques, such as pursed lip breathing or other method taught during program session;Demonstrates proper use of MDI's       Oxygen Re-Evaluation:   Oxygen Discharge (Final Oxygen Re-Evaluation):   Initial Exercise Prescription: Initial Exercise Prescription - 03/06/17 1600      Date of Initial Exercise RX and Referring Provider   Date  03/06/17    Referring Provider  Marlise Eves MD      Oxygen   Oxygen  Continuous    Liters  4      Treadmill   MPH  1.9    Grade  0.5    Minutes  15    METs  2.45      REL-XR   Level  2    Speed  50    Minutes  15    METs  3.2      T5 Nustep   Level  3    SPM  80    Minutes  15    METs  3.2      Prescription Details   Frequency (times per week)  3    Duration  Progress to 45 minutes of aerobic exercise without signs/symptoms of physical distress      Intensity   THRR 40-80% of Max  Heartrate  12-153    Ratings of Perceived Exertion  11-13    Perceived Dyspnea  0-4      Progression   Progression  Continue to progress workloads to maintain intensity without signs/symptoms of physical distress.      Resistance Training   Training Prescription  Yes  Weight  3 lbs    Reps  10-15       Perform Capillary Blood Glucose checks as needed.  Exercise Prescription Changes: Exercise Prescription Changes    Row Name 03/06/17 1600             Response to Exercise   Blood Pressure (Admit)  126/66       Blood Pressure (Exercise)  146/84       Blood Pressure (Exit)  134/64       Heart Rate (Admit)  96 bpm       Heart Rate (Exercise)  127 bpm       Heart Rate (Exit)  94 bpm       Oxygen Saturation (Admit)  91 %       Oxygen Saturation (Exercise)  73 %       Oxygen Saturation (Exit)  91 %       Rating of Perceived Exertion (Exercise)  17       Perceived Dyspnea (Exercise)  3       Symptoms  SOB, fatigue       Comments  walk test results          Exercise Comments:   Exercise Goals and Review: Exercise Goals    Row Name 03/06/17 1614             Exercise Goals   Increase Physical Activity  Yes       Intervention  Provide advice, education, support and counseling about physical activity/exercise needs.;Develop an individualized exercise prescription for aerobic and resistive training based on initial evaluation findings, risk stratification, comorbidities and participant's personal goals.       Expected Outcomes  Achievement of increased cardiorespiratory fitness and enhanced flexibility, muscular endurance and strength shown through measurements of functional capacity and personal statement of participant.       Increase Strength and Stamina  Yes       Intervention  Provide advice, education, support and counseling about physical activity/exercise needs.;Develop an individualized exercise prescription for aerobic and resistive training based on initial  evaluation findings, risk stratification, comorbidities and participant's personal goals.       Expected Outcomes  Achievement of increased cardiorespiratory fitness and enhanced flexibility, muscular endurance and strength shown through measurements of functional capacity and personal statement of participant.       Able to understand and use rate of perceived exertion (RPE) scale  Yes       Intervention  Provide education and explanation on how to use RPE scale       Expected Outcomes  Short Term: Able to use RPE daily in rehab to express subjective intensity level;Long Term:  Able to use RPE to guide intensity level when exercising independently       Able to understand and use Dyspnea scale  Yes       Intervention  Provide education and explanation on how to use Dyspnea scale       Expected Outcomes  Short Term: Able to use Dyspnea scale daily in rehab to express subjective sense of shortness of breath during exertion;Long Term: Able to use Dyspnea scale to guide intensity level when exercising independently       Knowledge and understanding of Target Heart Rate Range (THRR)  Yes       Intervention  Provide education and explanation of THRR including how the numbers were predicted and where they are located for reference       Expected Outcomes  Short Term: Able to state/look up THRR;Long Term: Able to use THRR to govern intensity when exercising independently       Able to check pulse independently  Yes       Intervention  Provide education and demonstration on how to check pulse in carotid and radial arteries.;Review the importance of being able to check your own pulse for safety during independent exercise       Expected Outcomes  Short Term: Able to explain why pulse checking is important during independent exercise;Long Term: Able to check pulse independently and accurately       Understanding of Exercise Prescription  Yes       Intervention  Provide education, explanation, and written  materials on patient's individual exercise prescription       Expected Outcomes  Short Term: Able to explain program exercise prescription;Long Term: Able to explain home exercise prescription to exercise independently          Exercise Goals Re-Evaluation :   Discharge Exercise Prescription (Final Exercise Prescription Changes): Exercise Prescription Changes - 03/06/17 1600      Response to Exercise   Blood Pressure (Admit)  126/66    Blood Pressure (Exercise)  146/84    Blood Pressure (Exit)  134/64    Heart Rate (Admit)  96 bpm    Heart Rate (Exercise)  127 bpm    Heart Rate (Exit)  94 bpm    Oxygen Saturation (Admit)  91 %    Oxygen Saturation (Exercise)  73 %    Oxygen Saturation (Exit)  91 %    Rating of Perceived Exertion (Exercise)  17    Perceived Dyspnea (Exercise)  3    Symptoms  SOB, fatigue    Comments  walk test results       Nutrition:  Target Goals: Understanding of nutrition guidelines, daily intake of sodium <1571m, cholesterol <2060m calories 30% from fat and 7% or less from saturated fats, daily to have 5 or more servings of fruits and vegetables.  Biometrics: Pre Biometrics - 03/06/17 1615      Pre Biometrics   Height  6' 0.1" (1.831 m)    Weight  194 lb 3.2 oz (88.1 kg)    Waist Circumference  36.5 inches    Hip Circumference  39 inches    Waist to Hip Ratio  0.94 %    BMI (Calculated)  26.28        Nutrition Therapy Plan and Nutrition Goals: Nutrition Therapy & Goals - 03/06/17 1457      Nutrition Therapy   RD appointment deferred  --      Personal Nutrition Goals   Comments  --      Intervention Plan   Intervention  Prescribe, educate and counsel regarding individualized specific dietary modifications aiming towards targeted core components such as weight, hypertension, lipid management, diabetes, heart failure and other comorbidities.;Nutrition handout(s) given to patient.    Expected Outcomes  Short Term Goal: Understand basic  principles of dietary content, such as calories, fat, sodium, cholesterol and nutrients.;Long Term Goal: Adherence to prescribed nutrition plan.       Nutrition Assessments: Nutrition Assessments - 03/06/17 1519      MEDFICTS Scores   Pre Score  57       Nutrition Goals Re-Evaluation:   Nutrition Goals Discharge (Final Nutrition Goals Re-Evaluation):   Psychosocial: Target Goals: Acknowledge presence or absence of significant depression and/or stress, maximize coping skills, provide positive support system. Participant is able to  verbalize types and ability to use techniques and skills needed for reducing stress and depression.   Initial Review & Psychosocial Screening: Initial Psych Review & Screening - 03/06/17 1456      Initial Review   Current issues with  Current Sleep Concerns      Family Dynamics   Good Support System?  Yes    Comments  Her brother, her mom and her boyfriend are good support systems.      Barriers   Psychosocial barriers to participate in program  The patient should benefit from training in stress management and relaxation.      Screening Interventions   Interventions  Encouraged to exercise;Program counselor consult;Provide feedback about the scores to participant;Yes;To provide support and resources with identified psychosocial needs    Expected Outcomes  Long Term goal: The participant improves quality of Life and PHQ9 Scores as seen by post scores and/or verbalization of changes;Short Term goal: Identification and review with participant of any Quality of Life or Depression concerns found by scoring the questionnaire.;Long Term Goal: Stressors or current issues are controlled or eliminated.;Short Term goal: Utilizing psychosocial counselor, staff and physician to assist with identification of specific Stressors or current issues interfering with healing process. Setting desired goal for each stressor or current issue identified.       Quality of  Life Scores:  Scores of 19 and below usually indicate a poorer quality of life in these areas.  A difference of  2-3 points is a clinically meaningful difference.  A difference of 2-3 points in the total score of the Quality of Life Index has been associated with significant improvement in overall quality of life, self-image, physical symptoms, and general health in studies assessing change in quality of life.  PHQ-9: Recent Review Flowsheet Data    Depression screen [facility] 2/9 03/06/2017   Decreased Interest 0   Down, Depressed, Hopeless 0   PHQ - 2 Score 0   Altered sleeping 3   Tired, decreased energy 3   Change in appetite 2   Feeling bad or failure about yourself  0   Trouble concentrating 0   Moving slowly or fidgety/restless 0   Suicidal thoughts 0   PHQ-9 Score 8   Difficult doing work/chores Somewhat difficult     Interpretation of Total Score  Total Score Depression Severity:  1-4 = Minimal depression, 5-9 = Mild depression, 10-14 = Moderate depression, 15-19 = Moderately severe depression, 20-27 = Severe depression   Psychosocial Evaluation and Intervention:   Psychosocial Re-Evaluation:   Psychosocial Discharge (Final Psychosocial Re-Evaluation):   Education: Education Goals: Education classes will be provided on a weekly basis, covering required topics. Participant will state understanding/return demonstration of topics presented.  Learning Barriers/Preferences: Learning Barriers/Preferences - 03/06/17 1523      Learning Barriers/Preferences   Learning Barriers  None    Learning Preferences  None       Education Topics:  Initial Evaluation Education: - Verbal, written and demonstration of respiratory meds, oximetry and breathing techniques. Instruction on use of nebulizers and MDIs and importance of monitoring MDI activations.   Pulmonary Rehab from 03/06/2017 in [facility] Cardiac and Pulmonary Rehab  Date  03/06/17  Educator  [facility]  Instruction Review Code  1-  Verbalizes Understanding      General Nutrition Guidelines/Fats and Fiber: -Group instruction provided by verbal, written material, models and posters to present the general guidelines for heart healthy nutrition. Gives an explanation and review of dietary fats and fiber.  Controlling Sodium/Reading Food Labels: -Group verbal and written material supporting the discussion of sodium use in heart healthy nutrition. Review and explanation with models, verbal and written materials for utilization of the food label.   Exercise Physiology & General Exercise Guidelines: - Group verbal and written instruction with models to review the exercise physiology of the cardiovascular system and associated critical values. Provides general exercise guidelines with specific guidelines to those with heart or lung disease.    Aerobic Exercise & Resistance Training: - Gives group verbal and written instruction on the various components of exercise. Focuses on aerobic and resistive training programs and the benefits of this training and how to safely progress through these programs.   Flexibility, Balance, Mind/Body Relaxation: Provides group verbal/written instruction on the benefits of flexibility and balance training, including mind/body exercise modes such as yoga, pilates and tai chi.  Demonstration and skill practice provided.   Stress and Anxiety: - Provides group verbal and written instruction about the health risks of elevated stress and causes of high stress.  Discuss the correlation between heart/lung disease and anxiety and treatment options. Review healthy ways to manage with stress and anxiety.   Depression: - Provides group verbal and written instruction on the correlation between heart/lung disease and depressed mood, treatment options, and the stigmas associated with seeking treatment.   Exercise & Equipment Safety: - Individual verbal instruction and demonstration of equipment use and  safety with use of the equipment.   Pulmonary Rehab from 03/06/2017 in [facility] Cardiac and Pulmonary Rehab  Date  03/06/17  Educator  [facility]  Instruction Review Code  1- Verbalizes Understanding      Infection Prevention: - Provides verbal and written material to individual with discussion of infection control including proper hand washing and proper equipment cleaning during exercise session.   Pulmonary Rehab from 03/06/2017 in [facility] Cardiac and Pulmonary Rehab  Date  03/06/17  Educator  [facility]  Instruction Review Code  1- Verbalizes Understanding      Falls Prevention: - Provides verbal and written material to individual with discussion of falls prevention and safety.   Pulmonary Rehab from 03/06/2017 in [facility] Cardiac and Pulmonary Rehab  Date  03/06/17  Educator  [facility]  Instruction Review Code  1- Verbalizes Understanding      Diabetes: - Individual verbal and written instruction to review signs/symptoms of diabetes, desired ranges of glucose level fasting, after meals and with exercise. Advice that pre and post exercise glucose checks will be done for 3 sessions at entry of program.   Chronic Lung Diseases: - Group verbal and written instruction to review updates, respiratory medications, advancements in procedures and treatments. Discuss use of supplemental oxygen including available portable oxygen systems, continuous and intermittent flow rates, concentrators, personal use and safety guidelines. Review proper use of inhaler and spacers. Provide informative websites for self-education.    Energy Conservation: - Provide group verbal and written instruction for methods to conserve energy, plan and organize activities. Instruct on pacing techniques, use of adaptive equipment and posture/positioning to relieve shortness of breath.   Triggers and Exacerbations: - Group verbal and written instruction to review types of environmental triggers and ways to prevent exacerbations. Discuss weather  changes, air quality and the benefits of nasal washing. Review warning signs and symptoms to help prevent infections. Discuss techniques for effective airway clearance, coughing, and vibrations.   AED/CPR: - Group verbal and written instruction with the use of models to demonstrate the basic use of the AED with the basic  ABC's of resuscitation.   Anatomy and Physiology of the Lungs: - Group verbal and written instruction with the use of models to provide basic lung anatomy and physiology related to function, structure and complications of lung disease.   Anatomy & Physiology of the Heart: - Group verbal and written instruction and models provide basic cardiac anatomy and physiology, with the coronary electrical and arterial systems. Review of Valvular disease and Heart Failure   Cardiac Medications: - Group verbal and written instruction to review commonly prescribed medications for heart disease. Reviews the medication, class of the drug, and side effects.   Know Your Numbers and Risk Factors: -Group verbal and written instruction about important numbers in your health.  Discussion of what are risk factors and how they play a role in the disease process.  Review of Cholesterol, Blood Pressure, Diabetes, and BMI and the role they play in your overall health.   Sleep Hygiene: -Provides group verbal and written instruction about how sleep can affect your health.  Define sleep hygiene, discuss sleep cycles and impact of sleep habits. Review good sleep hygiene tips.    Other: -Provides group and verbal instruction on various topics (see comments)    Knowledge Questionnaire Score: Knowledge Questionnaire Score - 03/06/17 1504      Knowledge Questionnaire Score   Pre Score  16/18 Reviewd with patient        Core Components/Risk Factors/Patient Goals at Admission: Personal Goals and Risk Factors at Admission - 03/06/17 1515      Core Components/Risk Factors/Patient Goals on  Admission    Weight Management  Weight Maintenance;Yes;Weight Loss she wants to tone    Intervention  Weight Management: Develop a combined nutrition and exercise program designed to reach desired caloric intake, while maintaining appropriate intake of nutrient and fiber, sodium and fats, and appropriate energy expenditure required for the weight goal.;Weight Management: Provide education and appropriate resources to help participant work on and attain dietary goals.    Admit Weight  194 lb 3.2 oz (88.1 kg)    Goal Weight: Short Term  190 lb (86.2 kg)    Goal Weight: Long Term  190 lb (86.2 kg)    Expected Outcomes  Short Term: Continue to assess and modify interventions until short term weight is achieved;Long Term: Adherence to nutrition and physical activity/exercise program aimed toward attainment of established weight goal;Weight Loss: Understanding of general recommendations for a balanced deficit meal plan, which promotes 1-2 lb weight loss per week and includes a negative energy balance of [phone] kcal/d;Understanding of distribution of calorie intake throughout the day with the consumption of 4-5 meals/snacks;Understanding recommendations for meals to include 15-35% energy as protein, 25-35% energy from fat, 35-60% energy from carbohydrates, less than 262m of dietary cholesterol, 20-35 gm of total fiber daily    Improve shortness of breath with ADL's  Yes    Intervention  Provide education, individualized exercise plan and daily activity instruction to help decrease symptoms of SOB with activities of daily living.    Expected Outcomes  Short Term: Achieves a reduction of symptoms when performing activities of daily living.    Diabetes  Yes    Intervention  Provide education about signs/symptoms and action to take for hypo/hyperglycemia.;Provide education about proper nutrition, including hydration, and aerobic/resistive exercise prescription along with prescribed medications to achieve blood  glucose in normal ranges: Fasting glucose 65-99 mg/dL    Expected Outcomes  Short Term: Participant verbalizes understanding of the signs/symptoms and immediate care of hyper/hypoglycemia, proper  foot care and importance of medication, aerobic/resistive exercise and nutrition plan for blood glucose control.;Long Term: Attainment of HbA1C < 7%.    Lipids  Yes    Intervention  Provide education and support for participant on nutrition & aerobic/resistive exercise along with prescribed medications to achieve LDL <47m, HDL >458m    Expected Outcomes  Short Term: Participant states understanding of desired cholesterol values and is compliant with medications prescribed. Participant is following exercise prescription and nutrition guidelines.;Long Term: Cholesterol controlled with medications as prescribed, with individualized exercise RX and with personalized nutrition plan. Value goals: LDL < 702mHDL > 40 mg.       Core Components/Risk Factors/Patient Goals Review:    Core Components/Risk Factors/Patient Goals at Discharge (Final Review):    ITP Comments: ITP Comments    Row Name 03/06/17 1435 04/02/17 0813 04/02/17 0814 04/09/17 0819 04/19/17 1235   ITP Comments  Medical Evaluation completed. Chart sent for review and changes to Dr. MarEmily [place]ctor [facility] [place]  patient has not attended since evaluation  30 day review completed. ITP sent to Dr. MarEmily Filbertrector of [place] with ITP unless changes are made by physician.  JacKennyth Loseated she has not been in class due to the flu. She states she will be back on Wednesday the 23rd.  patient has not been in class since 03/06/18. Patient was sent a discharge letter. 04/19/17   Row Name 04/19/17 1238           ITP Comments  Discharge ITP sent and signed by Dr. MilSabra HeckDischarge Summary routed to PCP and pulmonologist.          Comments: Discharge ITP

## 2017-04-19 NOTE — Progress Notes (Signed)
Discharge Progress Report  Patient Details  Name: Charlotte Cross MRN: [id number] Date of Birth: 1965-06-23 Referring Provider:     Pulmonary Rehab from 03/06/2017 in [facility] [place] [facility]  Referring Provider  Marlise Eves MD       Number of Visits: 1/36  Reason for Discharge:  Early Exit:  Lack of attendance  Smoking History:  Social History   Tobacco Use  Smoking Status Former Smoker  . Packs/day: 0.25  . Years: 6.00  . Pack years: 1.50  . Types: Cigarettes  . Last attempt to quit: 03/21/1987  . Years since quitting: 30.1  Smokeless Tobacco Never Used    Diagnosis:  Sarcoidosis of lung ([place])  ADL UCSD: Pulmonary Assessment Scores    Row Name 03/06/17 1503         ADL UCSD   ADL Phase  Entry     SOB Score total  56     Rest  0     Walk  2     Stairs  5     Bath  2     Dress  2     Shop  4       CAT Score   CAT Score  27       mMRC Score   mMRC Score  2        Initial Exercise Prescription: Initial Exercise Prescription - 03/06/17 1600      Date of Initial Exercise RX and Referring Provider   Date  03/06/17    Referring Provider  Marlise Eves MD      Oxygen   Oxygen  Continuous    Liters  4      Treadmill   MPH  1.9    Grade  0.5    Minutes  15    METs  2.45      REL-XR   Level  2    Speed  50    Minutes  15    METs  3.2      T5 Nustep   Level  3    SPM  80    Minutes  15    METs  3.2      Prescription Details   Frequency (times per week)  3    Duration  Progress to 45 minutes of aerobic exercise without signs/symptoms of physical distress      Intensity   THRR 40-80% of Max Heartrate  12-153    Ratings of Perceived Exertion  11-13    Perceived Dyspnea  0-4      Progression   Progression  Continue to progress workloads to maintain intensity without signs/symptoms of physical distress.      Resistance Training   Training Prescription  Yes    Weight  3 lbs    Reps  10-15       Discharge Exercise  Prescription (Final Exercise Prescription Changes): Exercise Prescription Changes - 03/06/17 1600      Response to Exercise   Blood Pressure (Admit)  126/66    Blood Pressure (Exercise)  146/84    Blood Pressure (Exit)  134/64    Heart Rate (Admit)  96 bpm    Heart Rate (Exercise)  127 bpm    Heart Rate (Exit)  94 bpm    Oxygen Saturation (Admit)  91 %    Oxygen Saturation (Exercise)  73 %    Oxygen Saturation (Exit)  91 %    Rating of Perceived Exertion (Exercise)  17    Perceived Dyspnea (Exercise)  3    Symptoms  SOB, fatigue    Comments  walk test results       Functional Capacity: 6 Minute Walk    Row Name 03/06/17 1603         6 Minute Walk   Phase  Initial     Distance  630 feet     Walk Time  3.6 minutes test stopped for desaturation     # of Rest Breaks  1 pt sat to rest at 3:36 and desaturated to 73%     MPH  1.99     METS  3.23     RPE  17     Perceived Dyspnea   3     VO2 Peak  11.29     Symptoms  Yes (comment)     Comments  SOB, fatigue     Resting HR  96 bpm     Resting BP  126/66     Resting Oxygen Saturation   91 %     Exercise Oxygen Saturation  during 6 min walk  73 %     Max Ex. HR  127 bpm     Max Ex. BP  146/84     2 Minute Post BP  134/64       Interval HR   1 Minute HR  115     2 Minute HR  125     3 Minute HR  127     4 Minute HR  123     5 Minute HR  112     6 Minute HR  98     2 Minute Post HR  93     Interval Heart Rate?  Yes       Interval Oxygen   Interval Oxygen?  Yes     Baseline Oxygen Saturation %  91 %     1 Minute Oxygen Saturation %  91 %     1 Minute Liters of Oxygen  3 L pulsed     2 Minute Oxygen Saturation %  82 % 2:34 80%     2 Minute Liters of Oxygen  3 L     3 Minute Oxygen Saturation %  86 % stopped at 3:36 81%     3 Minute Liters of Oxygen  3 L     4 Minute Oxygen Saturation %  73 % double checked with second pulse oximeter to verify     4 Minute Liters of Oxygen  3 L     5 Minute Oxygen Saturation %  73 %      5 Minute Liters of Oxygen  3 L     6 Minute Oxygen Saturation %  84 %     6 Minute Liters of Oxygen  3 L     2 Minute Post Oxygen Saturation %  88 % 9 min post 91%     2 Minute Post Liters of Oxygen  3 L        Psychological, QOL, Others - Outcomes: PHQ 2/9: Depression screen PHQ 2/9 03/06/2017  Decreased Interest 0  Down, Depressed, Hopeless 0  PHQ - 2 Score 0  Altered sleeping 3  Tired, decreased energy 3  Change in appetite 2  Feeling bad or failure about yourself  0  Trouble concentrating 0  Moving slowly or fidgety/restless 0  Suicidal thoughts 0  PHQ-9 Score 8  Difficult doing work/chores Somewhat difficult  Quality of Life:   Personal Goals: Goals established at orientation with interventions provided to work toward goal. Personal Goals and Risk Factors at Admission - 03/06/17 1515      Core Components/Risk Factors/Patient Goals on Admission    Weight Management  Weight Maintenance;Yes;Weight Loss she wants to tone    Intervention  Weight Management: Develop a combined nutrition and exercise program designed to reach desired caloric intake, while maintaining appropriate intake of nutrient and fiber, sodium and fats, and appropriate energy expenditure required for the weight goal.;Weight Management: Provide education and appropriate resources to help participant work on and attain dietary goals.    Admit Weight  194 lb 3.2 oz (88.1 kg)    Goal Weight: Short Term  190 lb (86.2 kg)    Goal Weight: Long Term  190 lb (86.2 kg)    Expected Outcomes  Short Term: Continue to assess and modify interventions until short term weight is achieved;Long Term: Adherence to nutrition and physical activity/exercise program aimed toward attainment of established weight goal;Weight Loss: Understanding of general recommendations for a balanced deficit meal plan, which promotes 1-2 lb weight loss per week and includes a negative energy balance of [phone] kcal/d;Understanding of  distribution of calorie intake throughout the day with the consumption of 4-5 meals/snacks;Understanding recommendations for meals to include 15-35% energy as protein, 25-35% energy from fat, 35-60% energy from carbohydrates, less than 255m of dietary cholesterol, 20-35 gm of total fiber daily    Improve shortness of breath with ADL's  Yes    Intervention  Provide education, individualized exercise plan and daily activity instruction to help decrease symptoms of SOB with activities of daily living.    Expected Outcomes  Short Term: Achieves a reduction of symptoms when performing activities of daily living.    Diabetes  Yes    Intervention  Provide education about signs/symptoms and action to take for hypo/hyperglycemia.;Provide education about proper nutrition, including hydration, and aerobic/resistive exercise prescription along with prescribed medications to achieve blood glucose in normal ranges: Fasting glucose 65-99 mg/dL    Expected Outcomes  Short Term: Participant verbalizes understanding of the signs/symptoms and immediate care of hyper/hypoglycemia, proper foot care and importance of medication, aerobic/resistive exercise and nutrition plan for blood glucose control.;Long Term: Attainment of HbA1C < 7%.    Lipids  Yes    Intervention  Provide education and support for participant on nutrition & aerobic/resistive exercise along with prescribed medications to achieve LDL <791m HDL >4026m   Expected Outcomes  Short Term: Participant states understanding of desired cholesterol values and is compliant with medications prescribed. Participant is following exercise prescription and nutrition guidelines.;Long Term: Cholesterol controlled with medications as prescribed, with individualized exercise RX and with personalized nutrition plan. Value goals: LDL < 70m5mDL > 40 mg.        Personal Goals Discharge:   Exercise Goals and Review: Exercise Goals    Row Name 03/06/17 1614              Exercise Goals   Increase Physical Activity  Yes       Intervention  Provide advice, education, support and counseling about physical activity/exercise needs.;Develop an individualized exercise prescription for aerobic and resistive training based on initial evaluation findings, risk stratification, comorbidities and participant's personal goals.       Expected Outcomes  Achievement of increased cardiorespiratory fitness and enhanced flexibility, muscular endurance and strength shown through measurements of functional capacity and personal statement of participant.  Increase Strength and Stamina  Yes       Intervention  Provide advice, education, support and counseling about physical activity/exercise needs.;Develop an individualized exercise prescription for aerobic and resistive training based on initial evaluation findings, risk stratification, comorbidities and participant's personal goals.       Expected Outcomes  Achievement of increased cardiorespiratory fitness and enhanced flexibility, muscular endurance and strength shown through measurements of functional capacity and personal statement of participant.       Able to understand and use rate of perceived exertion (RPE) scale  Yes       Intervention  Provide education and explanation on how to use RPE scale       Expected Outcomes  Short Term: Able to use RPE daily in rehab to express subjective intensity level;Long Term:  Able to use RPE to guide intensity level when exercising independently       Able to understand and use Dyspnea scale  Yes       Intervention  Provide education and explanation on how to use Dyspnea scale       Expected Outcomes  Short Term: Able to use Dyspnea scale daily in rehab to express subjective sense of shortness of breath during exertion;Long Term: Able to use Dyspnea scale to guide intensity level when exercising independently       Knowledge and understanding of Target Heart Rate Range (THRR)  Yes        Intervention  Provide education and explanation of THRR including how the numbers were predicted and where they are located for reference       Expected Outcomes  Short Term: Able to state/look up THRR;Long Term: Able to use THRR to govern intensity when exercising independently       Able to check pulse independently  Yes       Intervention  Provide education and demonstration on how to check pulse in carotid and radial arteries.;Review the importance of being able to check your own pulse for safety during independent exercise       Expected Outcomes  Short Term: Able to explain why pulse checking is important during independent exercise;Long Term: Able to check pulse independently and accurately       Understanding of Exercise Prescription  Yes       Intervention  Provide education, explanation, and written materials on patient's individual exercise prescription       Expected Outcomes  Short Term: Able to explain program exercise prescription;Long Term: Able to explain home exercise prescription to exercise independently          Nutrition & Weight - Outcomes: Pre Biometrics - 03/06/17 1615      Pre Biometrics   Height  6' 0.1" (1.831 m)    Weight  194 lb 3.2 oz (88.1 kg)    Waist Circumference  36.5 inches    Hip Circumference  39 inches    Waist to Hip Ratio  0.94 %    BMI (Calculated)  26.28        Nutrition: Nutrition Therapy & Goals - 03/06/17 1457      Nutrition Therapy   RD appointment deferred  --      Personal Nutrition Goals   Comments  --      Intervention Plan   Intervention  Prescribe, educate and counsel regarding individualized specific dietary modifications aiming towards targeted core components such as weight, hypertension, lipid management, diabetes, heart failure and other comorbidities.;Nutrition handout(s) given to patient.    Expected  Outcomes  Short Term Goal: Understand basic principles of dietary content, such as calories, fat, sodium, cholesterol and  nutrients.;Long Term Goal: Adherence to prescribed nutrition plan.       Nutrition Discharge: Nutrition Assessments - 03/06/17 1519      MEDFICTS Scores   Pre Score  57       Education Questionnaire Score: Knowledge Questionnaire Score - 03/06/17 1504      Knowledge Questionnaire Score   Pre Score  16/18 Reviewd with patient       Goals reviewed with patient; copy given to patient.

## 2017-04-20 ENCOUNTER — Ambulatory Visit: Payer: Medicare Other

## 2017-04-23 ENCOUNTER — Ambulatory Visit: Payer: Medicare Other

## 2017-04-25 ENCOUNTER — Ambulatory Visit: Payer: Medicare Other

## 2017-04-27 ENCOUNTER — Ambulatory Visit: Payer: Medicare Other

## 2017-04-30 ENCOUNTER — Ambulatory Visit: Payer: Medicare Other

## 2017-05-02 ENCOUNTER — Other Ambulatory Visit: Admit: 2017-05-02 | Discharge: 2017-05-03 | Payer: MEDICARE

## 2017-05-02 ENCOUNTER — Ambulatory Visit: Payer: Medicare Other

## 2017-05-02 DIAGNOSIS — B440 Invasive pulmonary aspergillosis: Principal | ICD-10-CM

## 2017-05-02 MED ORDER — GABAPENTIN 300 MG CAPSULE
ORAL_CAPSULE | Freq: Three times a day (TID) | ORAL | 1 refills | 0 days | Status: CP
Start: 2017-05-02 — End: 2017-11-02

## 2017-05-02 MED ORDER — METFORMIN 1,000 MG TABLET
ORAL_TABLET | Freq: Two times a day (BID) | ORAL | 1 refills | 0.00000 days | Status: CP
Start: 2017-05-02 — End: 2018-08-09

## 2017-05-02 MED ORDER — CYCLOBENZAPRINE 10 MG TABLET
ORAL_TABLET | Freq: Three times a day (TID) | ORAL | 0 refills | 0 days | Status: CP | PRN
Start: 2017-05-02 — End: 2018-03-21

## 2017-05-03 MED ORDER — HYDROCHLOROTHIAZIDE 25 MG TABLET
ORAL_TABLET | Freq: Every day | ORAL | 1 refills | 0.00000 days | Status: SS
Start: 2017-05-03 — End: 2018-02-18

## 2017-05-04 ENCOUNTER — Ambulatory Visit: Payer: Medicare Other

## 2017-05-07 ENCOUNTER — Ambulatory Visit: Payer: Medicare Other

## 2017-05-09 ENCOUNTER — Ambulatory Visit: Payer: Medicare Other

## 2017-05-11 ENCOUNTER — Ambulatory Visit: Admit: 2017-05-11 | Discharge: 2017-05-12 | Payer: MEDICARE | Attending: Clinical | Primary: Clinical

## 2017-05-11 ENCOUNTER — Ambulatory Visit: Payer: Medicare Other

## 2017-05-11 DIAGNOSIS — Z0000 Encounter for general adult medical examination without abnormal findings: Principal | ICD-10-CM

## 2017-05-11 DIAGNOSIS — Z1239 Encounter for other screening for malignant neoplasm of breast: Secondary | ICD-10-CM

## 2017-05-14 ENCOUNTER — Ambulatory Visit: Payer: Medicare Other

## 2017-05-16 ENCOUNTER — Ambulatory Visit: Payer: Medicare Other

## 2017-05-18 ENCOUNTER — Ambulatory Visit: Payer: Medicare Other

## 2017-05-21 ENCOUNTER — Ambulatory Visit: Payer: Medicare Other

## 2017-05-23 ENCOUNTER — Ambulatory Visit: Payer: Medicare Other

## 2017-05-25 ENCOUNTER — Ambulatory Visit: Payer: Medicare Other

## 2017-05-28 ENCOUNTER — Ambulatory Visit: Payer: Medicare Other

## 2017-05-30 ENCOUNTER — Ambulatory Visit: Payer: Medicare Other

## 2017-06-01 ENCOUNTER — Ambulatory Visit: Payer: Medicare Other

## 2017-06-04 ENCOUNTER — Ambulatory Visit: Payer: Medicare Other

## 2017-06-12 ENCOUNTER — Other Ambulatory Visit: Admit: 2017-06-12 | Discharge: 2017-06-12 | Payer: MEDICARE

## 2017-06-12 DIAGNOSIS — B440 Invasive pulmonary aspergillosis: Principal | ICD-10-CM

## 2017-07-03 ENCOUNTER — Other Ambulatory Visit: Admit: 2017-07-03 | Discharge: 2017-07-04 | Payer: MEDICARE

## 2017-07-03 DIAGNOSIS — B440 Invasive pulmonary aspergillosis: Secondary | ICD-10-CM

## 2017-07-03 DIAGNOSIS — E118 Type 2 diabetes mellitus with unspecified complications: Principal | ICD-10-CM

## 2017-07-03 DIAGNOSIS — R042 Hemoptysis: Secondary | ICD-10-CM

## 2017-07-17 ENCOUNTER — Other Ambulatory Visit: Admit: 2017-07-17 | Discharge: 2017-07-17 | Payer: MEDICARE

## 2017-07-17 DIAGNOSIS — B440 Invasive pulmonary aspergillosis: Principal | ICD-10-CM

## 2017-09-11 ENCOUNTER — Other Ambulatory Visit: Admit: 2017-09-11 | Discharge: 2017-09-12 | Payer: MEDICARE

## 2017-09-11 DIAGNOSIS — B440 Invasive pulmonary aspergillosis: Secondary | ICD-10-CM

## 2017-09-11 DIAGNOSIS — R042 Hemoptysis: Principal | ICD-10-CM

## 2017-09-25 ENCOUNTER — Other Ambulatory Visit: Admit: 2017-09-25 | Discharge: 2017-09-26 | Payer: MEDICARE

## 2017-09-25 DIAGNOSIS — B440 Invasive pulmonary aspergillosis: Principal | ICD-10-CM

## 2017-10-16 ENCOUNTER — Other Ambulatory Visit: Admit: 2017-10-16 | Discharge: 2017-10-17 | Payer: MEDICARE

## 2017-10-16 DIAGNOSIS — B440 Invasive pulmonary aspergillosis: Principal | ICD-10-CM

## 2017-10-17 ENCOUNTER — Encounter: Payer: Self-pay | Admitting: *Deleted

## 2017-10-17 ENCOUNTER — Other Ambulatory Visit: Payer: Self-pay | Admitting: *Deleted

## 2017-10-17 NOTE — Telephone Encounter (Signed)
My chart message sent. Patient needs Consult (referral from [facility]).

## 2017-10-25 MED ORDER — ALBUTEROL SULFATE HFA 90 MCG/ACTUATION AEROSOL INHALER
RESPIRATORY_TRACT | 11 refills | 0.00000 days | Status: CP | PRN
Start: 2017-10-25 — End: 2018-11-18

## 2017-10-25 MED ORDER — PRAVASTATIN 40 MG TABLET
ORAL_TABLET | Freq: Every day | ORAL | 1 refills | 0 days | Status: CP
Start: 2017-10-25 — End: 2018-08-09

## 2017-10-26 MED ORDER — VORICONAZOLE 200 MG TABLET
ORAL_TABLET | 3 refills | 0 days | Status: CP
Start: 2017-10-26 — End: 2017-12-14

## 2017-11-02 MED ORDER — GABAPENTIN 300 MG CAPSULE
ORAL_CAPSULE | 1 refills | 0 days | Status: CP
Start: 2017-11-02 — End: 2018-04-22

## 2017-11-05 ENCOUNTER — Ambulatory Visit: Admit: 2017-11-05 | Discharge: 2017-11-06 | Payer: MEDICARE | Attending: Family | Primary: Family

## 2017-11-05 DIAGNOSIS — I10 Essential (primary) hypertension: Secondary | ICD-10-CM

## 2017-11-05 DIAGNOSIS — M79601 Pain in right arm: Secondary | ICD-10-CM

## 2017-11-05 DIAGNOSIS — B440 Invasive pulmonary aspergillosis: Secondary | ICD-10-CM

## 2017-11-05 DIAGNOSIS — N941 Unspecified dyspareunia: Secondary | ICD-10-CM

## 2017-11-05 DIAGNOSIS — Z794 Long term (current) use of insulin: Secondary | ICD-10-CM

## 2017-11-05 DIAGNOSIS — J449 Chronic obstructive pulmonary disease, unspecified: Secondary | ICD-10-CM

## 2017-11-05 DIAGNOSIS — Z1239 Encounter for other screening for malignant neoplasm of breast: Secondary | ICD-10-CM

## 2017-11-05 DIAGNOSIS — E1165 Type 2 diabetes mellitus with hyperglycemia: Principal | ICD-10-CM

## 2017-11-05 DIAGNOSIS — Z1211 Encounter for screening for malignant neoplasm of colon: Secondary | ICD-10-CM

## 2017-11-05 MED ORDER — CONJUGATED ESTROGENS 0.625 MG/GRAM VAGINAL CREAM
VAGINAL | 11 refills | 0.00000 days | Status: CP
Start: 2017-11-05 — End: 2017-12-14

## 2017-12-06 ENCOUNTER — Ambulatory Visit: Admit: 2017-12-06 | Discharge: 2017-12-07 | Payer: MEDICARE

## 2017-12-06 DIAGNOSIS — J449 Chronic obstructive pulmonary disease, unspecified: Secondary | ICD-10-CM

## 2017-12-06 DIAGNOSIS — D869 Sarcoidosis, unspecified: Secondary | ICD-10-CM

## 2017-12-06 DIAGNOSIS — J479 Bronchiectasis, uncomplicated: Principal | ICD-10-CM

## 2017-12-06 DIAGNOSIS — J418 Mixed simple and mucopurulent chronic bronchitis: Secondary | ICD-10-CM

## 2017-12-06 DIAGNOSIS — B440 Invasive pulmonary aspergillosis: Secondary | ICD-10-CM

## 2017-12-06 MED ORDER — ALBUTEROL SULFATE 2.5 MG/3 ML (0.083 %) SOLUTION FOR NEBULIZATION
Freq: Four times a day (QID) | RESPIRATORY_TRACT | 11 refills | 0.00000 days | Status: CP | PRN
Start: 2017-12-06 — End: 2018-06-10

## 2017-12-06 MED ORDER — SODIUM CHLORIDE 3 % FOR NEBULIZATION
Freq: Two times a day (BID) | RESPIRATORY_TRACT | 11 refills | 0 days | Status: CP
Start: 2017-12-06 — End: 2018-10-10

## 2017-12-06 MED ORDER — BUDESONIDE-FORMOTEROL HFA 160 MCG-4.5 MCG/ACTUATION AEROSOL INHALER
Freq: Two times a day (BID) | RESPIRATORY_TRACT | 3 refills | 0 days | Status: CP
Start: 2017-12-06 — End: 2018-03-14

## 2017-12-12 ENCOUNTER — Other Ambulatory Visit: Admit: 2017-12-12 | Discharge: 2017-12-13 | Payer: MEDICARE

## 2017-12-12 DIAGNOSIS — B440 Invasive pulmonary aspergillosis: Principal | ICD-10-CM

## 2017-12-14 ENCOUNTER — Ambulatory Visit: Admit: 2017-12-14 | Discharge: 2017-12-14 | Payer: MEDICARE

## 2017-12-14 DIAGNOSIS — B440 Invasive pulmonary aspergillosis: Principal | ICD-10-CM

## 2017-12-14 DIAGNOSIS — R845 Abnormal microbiological findings in specimens from respiratory organs and thorax: Secondary | ICD-10-CM

## 2017-12-14 MED ORDER — VORICONAZOLE 200 MG TABLET
ORAL_TABLET | Freq: Two times a day (BID) | ORAL | 3 refills | 0 days | Status: CP
Start: 2017-12-14 — End: 2018-04-05

## 2018-01-08 ENCOUNTER — Other Ambulatory Visit: Admit: 2018-01-08 | Discharge: 2018-01-08 | Payer: MEDICARE

## 2018-01-08 ENCOUNTER — Ambulatory Visit: Admit: 2018-01-08 | Discharge: 2018-01-08 | Payer: MEDICARE

## 2018-01-08 DIAGNOSIS — B440 Invasive pulmonary aspergillosis: Principal | ICD-10-CM

## 2018-02-11 ENCOUNTER — Ambulatory Visit: Admit: 2018-02-11 | Discharge: 2018-02-12 | Payer: MEDICARE | Attending: Dermatology | Primary: Dermatology

## 2018-02-11 DIAGNOSIS — Z79899 Other long term (current) drug therapy: Secondary | ICD-10-CM

## 2018-02-11 DIAGNOSIS — D229 Melanocytic nevi, unspecified: Principal | ICD-10-CM

## 2018-02-13 ENCOUNTER — Ambulatory Visit
Admit: 2018-02-13 | Discharge: 2018-02-18 | Disposition: A | Discharge status: Home / Self Care | Payer: MEDICARE | Admitting: Pulmonary Disease

## 2018-02-13 DIAGNOSIS — D860 Sarcoidosis of lung: Principal | ICD-10-CM

## 2018-02-14 DIAGNOSIS — D860 Sarcoidosis of lung: Principal | ICD-10-CM

## 2018-02-18 MED ORDER — PEN NEEDLE, DIABETIC 32 GAUGE X 5/32" (4 MM)
Freq: Four times a day (QID) | 0 refills | 0.00000 days | Status: CP
Start: 2018-02-18 — End: 2018-03-20

## 2018-02-18 MED ORDER — LANCETS
Freq: Four times a day (QID) | 0 refills | 0.00000 days | Status: CP
Start: 2018-02-18 — End: 2018-03-14

## 2018-02-18 MED ORDER — INSULIN LISPRO (U-100) 100 UNIT/ML SUBCUTANEOUS SOLUTION
Freq: Four times a day (QID) | SUBCUTANEOUS | 0 refills | 0.00000 days | Status: CP
Start: 2018-02-18 — End: 2018-11-01

## 2018-02-18 MED ORDER — BLOOD SUGAR DIAGNOSTIC STRIPS
ORAL_STRIP | Freq: Four times a day (QID) | 0 refills | 0 days | Status: CP
Start: 2018-02-18 — End: 2018-03-04

## 2018-02-18 MED ORDER — INSULIN GLARGINE (U-100) 100 UNIT/ML (3 ML) SUBCUTANEOUS PEN
PEN_INJECTOR | 3 refills | 0 days | Status: CP
Start: 2018-02-18 — End: 2018-10-21

## 2018-02-18 MED ORDER — INSULIN LISPRO (U-100) 100 UNIT/ML SUBCUTANEOUS PEN
Freq: Three times a day (TID) | SUBCUTANEOUS | 0 refills | 0.00000 days | Status: CP
Start: 2018-02-18 — End: 2018-03-20

## 2018-02-18 MED ORDER — HYDROCHLOROTHIAZIDE 25 MG TABLET
ORAL_TABLET | Freq: Every day | ORAL | 1 refills | 0.00000 days | Status: CP
Start: 2018-02-18 — End: 2018-10-22

## 2018-02-19 ENCOUNTER — Other Ambulatory Visit: Admit: 2018-02-19 | Discharge: 2018-02-20 | Payer: MEDICARE

## 2018-02-19 DIAGNOSIS — B440 Invasive pulmonary aspergillosis: Principal | ICD-10-CM

## 2018-02-19 MED ORDER — PREDNISONE 20 MG TABLET
ORAL_TABLET | Freq: Every day | ORAL | 0 refills | 0 days | Status: CP
Start: 2018-02-19 — End: 2018-03-19

## 2018-02-19 MED ORDER — AMOXICILLIN 875 MG-POTASSIUM CLAVULANATE 125 MG TABLET
ORAL_TABLET | Freq: Two times a day (BID) | ORAL | 0 refills | 0 days | Status: CP
Start: 2018-02-19 — End: 2018-02-23

## 2018-02-27 ENCOUNTER — Ambulatory Visit: Admit: 2018-02-27 | Discharge: 2018-02-28 | Payer: MEDICARE

## 2018-02-27 DIAGNOSIS — J479 Bronchiectasis, uncomplicated: Secondary | ICD-10-CM

## 2018-02-27 DIAGNOSIS — D869 Sarcoidosis, unspecified: Principal | ICD-10-CM

## 2018-02-27 DIAGNOSIS — R0683 Snoring: Secondary | ICD-10-CM

## 2018-02-27 DIAGNOSIS — R0902 Hypoxemia: Secondary | ICD-10-CM

## 2018-02-27 DIAGNOSIS — J8417 Other interstitial pulmonary diseases with fibrosis in diseases classified elsewhere: Secondary | ICD-10-CM

## 2018-02-27 MED ORDER — PREDNISONE 10 MG TABLET
ORAL_TABLET | 0 refills | 0 days | Status: CP
Start: 2018-02-27 — End: 2018-04-05

## 2018-02-27 MED ORDER — SODIUM CHLORIDE 3 % FOR NEBULIZATION
Freq: Two times a day (BID) | RESPIRATORY_TRACT | 11 refills | 0 days | Status: CP
Start: 2018-02-27 — End: 2018-04-25
  Filled 2018-03-18: qty 240, 30d supply, fill #0

## 2018-02-28 ENCOUNTER — Ambulatory Visit: Admit: 2018-02-28 | Discharge: 2018-03-01 | Payer: MEDICARE

## 2018-02-28 DIAGNOSIS — M25511 Pain in right shoulder: Secondary | ICD-10-CM

## 2018-02-28 DIAGNOSIS — M79601 Pain in right arm: Principal | ICD-10-CM

## 2018-02-28 DIAGNOSIS — Z1231 Encounter for screening mammogram for malignant neoplasm of breast: Principal | ICD-10-CM

## 2018-02-28 DIAGNOSIS — J8417 Other interstitial pulmonary diseases with fibrosis in diseases classified elsewhere: Principal | ICD-10-CM

## 2018-02-28 DIAGNOSIS — D869 Sarcoidosis, unspecified: Secondary | ICD-10-CM

## 2018-03-04 MED ORDER — BLOOD SUGAR DIAGNOSTIC STRIPS
ORAL_STRIP | Freq: Four times a day (QID) | 1 refills | 0 days | Status: CP
Start: 2018-03-04 — End: 2019-03-05

## 2018-03-05 ENCOUNTER — Other Ambulatory Visit: Admit: 2018-03-05 | Discharge: 2018-03-06 | Payer: MEDICARE

## 2018-03-05 DIAGNOSIS — B440 Invasive pulmonary aspergillosis: Secondary | ICD-10-CM

## 2018-03-05 DIAGNOSIS — Z794 Long term (current) use of insulin: Secondary | ICD-10-CM

## 2018-03-05 DIAGNOSIS — E1165 Type 2 diabetes mellitus with hyperglycemia: Principal | ICD-10-CM

## 2018-03-14 MED ORDER — BUDESONIDE-FORMOTEROL HFA 160 MCG-4.5 MCG/ACTUATION AEROSOL INHALER
Freq: Two times a day (BID) | RESPIRATORY_TRACT | 2 refills | 0.00000 days | Status: CP
Start: 2018-03-14 — End: 2018-11-18

## 2018-03-14 MED ORDER — LANCETS
Freq: Four times a day (QID) | 1 refills | 0.00000 days | Status: CP
Start: 2018-03-14 — End: 2018-04-25

## 2018-03-18 MED FILL — SODIUM CHLORIDE 3 % FOR NEBULIZATION: 30 days supply | Qty: 240 | Fill #0 | Status: AC

## 2018-03-19 ENCOUNTER — Other Ambulatory Visit: Admit: 2018-03-19 | Discharge: 2018-03-20 | Payer: MEDICARE

## 2018-03-19 DIAGNOSIS — B440 Invasive pulmonary aspergillosis: Principal | ICD-10-CM

## 2018-03-21 MED ORDER — CYCLOBENZAPRINE 10 MG TABLET
0 refills | 0 days | Status: CP
Start: 2018-03-21 — End: 2018-06-10

## 2018-04-05 ENCOUNTER — Ambulatory Visit: Admit: 2018-04-05 | Discharge: 2018-04-06 | Payer: MEDICARE

## 2018-04-05 DIAGNOSIS — J984 Other disorders of lung: Secondary | ICD-10-CM

## 2018-04-05 DIAGNOSIS — B441 Other pulmonary aspergillosis: Principal | ICD-10-CM

## 2018-04-05 DIAGNOSIS — D869 Sarcoidosis, unspecified: Secondary | ICD-10-CM

## 2018-04-05 MED ORDER — POSACONAZOLE 100 MG TABLET,DELAYED RELEASE
ORAL_TABLET | 2 refills | 0 days | Status: CP
Start: 2018-04-05 — End: 2018-05-15

## 2018-04-23 MED ORDER — GABAPENTIN 300 MG CAPSULE
ORAL_CAPSULE | Freq: Three times a day (TID) | ORAL | 1 refills | 0 days | Status: CP
Start: 2018-04-23 — End: 2018-08-09

## 2018-04-25 ENCOUNTER — Ambulatory Visit: Admit: 2018-04-25 | Discharge: 2018-04-25 | Payer: MEDICARE

## 2018-04-25 DIAGNOSIS — D869 Sarcoidosis, unspecified: Secondary | ICD-10-CM

## 2018-04-25 DIAGNOSIS — J479 Bronchiectasis, uncomplicated: Principal | ICD-10-CM

## 2018-04-25 DIAGNOSIS — R0602 Shortness of breath: Secondary | ICD-10-CM

## 2018-04-25 MED ORDER — SODIUM CHLORIDE 7 % FOR NEBULIZATION
Freq: Two times a day (BID) | RESPIRATORY_TRACT | 3 refills | 0 days | Status: CP
Start: 2018-04-25 — End: 2018-08-06
  Filled 2018-05-08: qty 720, 90d supply, fill #0

## 2018-05-08 MED FILL — SODIUM CHLORIDE 7 % FOR NEBULIZATION: 90 days supply | Qty: 720 | Fill #0 | Status: AC

## 2018-05-13 MED ORDER — MOXIFLOXACIN 400 MG TABLET
ORAL_TABLET | Freq: Every day | ORAL | 0 refills | 0.00000 days | Status: CP
Start: 2018-05-13 — End: 2018-07-01

## 2018-05-13 MED ORDER — SULFAMETHOXAZOLE 400 MG-TRIMETHOPRIM 80 MG TABLET
ORAL_TABLET | Freq: Two times a day (BID) | ORAL | 0 refills | 0 days | Status: CP
Start: 2018-05-13 — End: 2018-07-01

## 2018-05-15 MED ORDER — POSACONAZOLE 100 MG TABLET,DELAYED RELEASE
ORAL_TABLET | 2 refills | 0 days | Status: CP
Start: 2018-05-15 — End: 2018-06-10

## 2018-05-22 ENCOUNTER — Other Ambulatory Visit: Admit: 2018-05-22 | Discharge: 2018-05-23 | Payer: MEDICARE

## 2018-05-22 DIAGNOSIS — B440 Invasive pulmonary aspergillosis: Principal | ICD-10-CM

## 2018-05-22 DIAGNOSIS — A310 Pulmonary mycobacterial infection: Principal | ICD-10-CM

## 2018-06-04 DIAGNOSIS — B440 Invasive pulmonary aspergillosis: Principal | ICD-10-CM

## 2018-06-04 DIAGNOSIS — B441 Other pulmonary aspergillosis: Principal | ICD-10-CM

## 2018-06-10 DIAGNOSIS — J449 Chronic obstructive pulmonary disease, unspecified: Principal | ICD-10-CM

## 2018-06-10 DIAGNOSIS — D869 Sarcoidosis, unspecified: Principal | ICD-10-CM

## 2018-06-10 MED ORDER — POSACONAZOLE 100 MG TABLET,DELAYED RELEASE
ORAL_TABLET | Freq: Every day | ORAL | 2 refills | 0 days | Status: CP
Start: 2018-06-10 — End: 2018-07-01

## 2018-06-10 MED ORDER — CYCLOBENZAPRINE 10 MG TABLET
Freq: Three times a day (TID) | ORAL | 2 refills | 0 days | Status: CP | PRN
Start: 2018-06-10 — End: 2019-06-10

## 2018-06-10 MED ORDER — IPRATROPIUM BROMIDE 0.02 % SOLUTION FOR INHALATION
0 refills | 0 days | Status: CP
Start: 2018-06-10 — End: 2018-10-23

## 2018-06-10 MED ORDER — ALBUTEROL SULFATE 2.5 MG/3 ML (0.083 %) SOLUTION FOR NEBULIZATION
Freq: Four times a day (QID) | RESPIRATORY_TRACT | 11 refills | 0 days | Status: CP | PRN
Start: 2018-06-10 — End: ?

## 2018-06-25 ENCOUNTER — Ambulatory Visit: Admit: 2018-06-25 | Discharge: 2018-06-26 | Payer: MEDICARE | Attending: Pulmonary Disease | Primary: Pulmonary Disease

## 2018-06-25 DIAGNOSIS — D869 Sarcoidosis, unspecified: Principal | ICD-10-CM

## 2018-06-25 MED ORDER — MEDICAL SUPPLY ITEM
PACK | 0 refills | 0 days | Status: CP
Start: 2018-06-25 — End: ?

## 2018-06-25 MED ORDER — PREDNISONE 10 MG TABLET
ORAL_TABLET | 1 refills | 0 days | Status: CP
Start: 2018-06-25 — End: 2018-07-01

## 2018-06-25 MED ORDER — ACCU-CHEK GUIDE GLUCOSE METER
PACK | Freq: Four times a day (QID) | 0 refills | 0.00000 days | Status: CP
Start: 2018-06-25 — End: ?
  Filled 2018-09-10: qty 240, 30d supply, fill #0

## 2018-07-01 MED ORDER — POSACONAZOLE 100 MG TABLET,DELAYED RELEASE
ORAL_TABLET | Freq: Every day | ORAL | 2 refills | 0 days | Status: CP
Start: 2018-07-01 — End: ?

## 2018-07-01 MED ORDER — EPINEPHRINE 0.3 MG/0.3 ML INJECTION, AUTO-INJECTOR
Freq: Once | INTRAMUSCULAR | 1 refills | 0.00000 days | Status: CP
Start: 2018-07-01 — End: 2018-07-01

## 2018-07-01 MED ORDER — MEPOLIZUMAB 100 MG/ML SUBCUTANEOUS AUTO-INJECTOR
SUBCUTANEOUS | 12 refills | 28 days | Status: CP
Start: 2018-07-01 — End: ?
  Filled 2018-07-11: qty 1, 28d supply, fill #0

## 2018-07-01 MED ORDER — PREDNISONE 10 MG TABLET
ORAL_TABLET | Freq: Every day | ORAL | 0 refills | 0.00000 days | Status: CP
Start: 2018-07-01 — End: 2018-10-24

## 2018-07-04 MED ORDER — EMPTY CONTAINER
2 refills | 0 days
Start: 2018-07-04 — End: ?

## 2018-07-04 NOTE — Unmapped (Signed)
[facility] Medication Referral: [place] Approved      Medication (Brand/Generic): Nucala    Final Test Claim completed with resulted information below:    Patient ABLE to fill at [facility]  Insurance Company:  [employer]  Anticipated Copay: $0  Is anticipated copay with a copay card or grant? No, there is no need for grant or copay assistance.     Does patient's insurance plan only allow a 15 day supply for the first 6 fills in the Split Fill Program? No  If yes, inform patient they can request to dis-enroll from the [facility] by calling the patient help desk at N/A.      If the copay is under the $25 defined limit, per policy there will be no further investigation of need for financial assistance at this time unless patient requests. This referral has been communicated to the provider and handed off to the [facility] team for further processing and filling of prescribed medication.   ______________________________________________________________________  Please utilize this referral for viewing purposes as it will serve as the central location for all relevant documentation and updates.

## 2018-07-04 NOTE — Unmapped (Signed)
[facility]   Patient Onboarding/Medication Counseling    Karen Barber is a 53 y.o. female with severe persistent asthma who I am counseling today on initiation of therapy.  I am speaking to the patient.    Verified patient's date of birth / HIPAA.    Specialty medication(s) to be sent: General Specialty: Nucala      Non-specialty medications/supplies to be sent: SHARPS CONTAINER      Medications not needed at this time: n/a           Nucala (mepolizumab)    Medication & Administration     Dosage: Nucala 100mg /ml Auto-injector: Inject 1 ml (100mg ) SQ once every 4 weeks      Administration:     Auto-Injector:  ? Gather all supplies needed for injection on a clean, flat working surface: medication syringe(s) removed from packaging, alcohol swab, sharps container, etc.  ? Look at the medication label ??? look for correct medication, correct dose, and check the expiration date  ? Look at the medication ??? the liquid in the syringe should appear clear and colorless to slightly yellow. Do not use if the solution is cloudy, leaking, or has particles  ? Lay the syringe on a flat surface and allow it to warm up to room temperature for at least 30 minutes  ? Select injection site ??? you can use the front of your thigh or your belly (but not the area 2 inches around your belly button); if someone else is giving you the injection you can also use your upper arm in the skin covering your triceps muscle  ? Prepare injection site ??? wash your hands and clean the skin at the injection site with an alcohol swab and let it air dry, do not touch the injection site again before the injection  ? Pull off the needle safety cap, do not remove until immediately prior to injection; Inject within 5 minutes of removing the clear needle cap. Do not touch the yellow needle guard or put the cap back on.   ? With the inspection window facing you, press auto-injector straight on injection site. Hold it down and push it down against skin. This will make yellow needle guard slide up   ? You should hear a click to let you know injection has started   ? The yellow indicator will move down through inspection window as you are completing your dose   ? It may take 15 seconds to get the full dose   ? Continue holding until you hear the second click. The inspection window should be filled with the yellow indicator   ? Continue to hold for another 5 seconds after you hear the 2nd click   ? Dispose of the used syringe immediately in your sharps disposal container, do not attempt to recap the needle prior to disposing  ? If you see any blood at the injection site, press a cotton ball or gauze on the site and maintain pressure until the bleeding stops, do not rub the injection site      Adherence/Missed dose instructions:  Take missed dose as soon as you remember. If it is close to the time of your next dose, skip the dose and resume with your next scheduled dose.    Goals of Therapy     Control asthma and help reduce frequency of exacerbations and use of oral steroids    Side Effects & Monitoring Parameters     ? Headache   ?  Back pain   ? Feeling tired or weak   ? Redness or swelling at injection site   ? Pain or burning where the drug was used    The following side effects should be reported to the provider:  ? Signs of allergic reaction such as rash, hives, itching, red, swollen, blistered, or peeling skin, trouble breathing or swallowing, swelling of the mouth, face, lips, tongue, or throat   ? Dizziness or passing out   ? Very bad headache   ? Flushing   ? Feeling hot or cold   ? Shortness of breath      Contraindications, Warnings, & Precautions     ? Hypersensitivity to mepolizumab or any components of the formualy    Drug/Food Interactions     ? Medication list reviewed in Epic. The patient was instructed to inform the care team before taking any new medications or supplements. No drug interactions identified.   .     Storage, Handling Precautions, & Disposal   ? Prefilled autoinjector/syringe: Store at 2??C to 8??C (36??F to 46??F). Do not freeze; protect from light. Do not shake. An unopened carton may be stored at ?30??C (86??F) for ?7 days; discard if kept at room temperature for >7 days. Once autoinjector/syringe is removed from carton must administer within 8 hours; otherwise discard.      Current Medications (including OTC/herbals), Comorbidities and Allergies     Current Outpatient Medications   Medication Sig Dispense Refill   ??? ACCU-CHEK FASTCLIX LANCET DRUM Misc USE TO CHECK GLUCOSE 4 TIMES DAILY BEFORE MEAL(S) AND NIGHTLY     ??? ACCU-CHEK GUIDE GLUCOSE METER Misc 1 Device by Other route Four (4) times a day (before meals and nightly). AS DIRECTED 1 kit 0   ??? albuterol 2.5 mg /3 mL (0.083 %) nebulizer solution Inhale 3 mL (2.5 mg total) by nebulization every six (6) hours as needed for wheezing or shortness of breath (airway clearance/cough). 120 vial 11   ??? albuterol HFA 90 mcg/actuation inhaler Inhale 2 puffs every four (4) hours as needed for wheezing or shortness of breath. 2 Inhaler 11   ??? blood sugar diagnostic Strp by Other route Four (4) times a day (before meals and nightly). 400 strip 1   ??? budesonide-formoterol (SYMBICORT) 160-4.5 mcg/actuation inhaler Inhale 2 puffs Two (2) times a day. 3 Inhaler 2   ??? cyclobenzaprine (FLEXERIL) 10 MG tablet Take 1 tablet (10 mg total) by mouth Three (3) times a day as needed. 90 each 2   ??? EPINEPHrine (EPIPEN 2-PAK) 0.3 mg/0.3 mL injection Inject 0.3 mL (0.3 mg total) into the muscle once for 1 dose. 1 Device 1   ??? gabapentin (NEURONTIN) 300 MG capsule Take 1 capsule (300 mg total) by mouth Three (3) times a day. 90 capsule 1   ??? hydroCHLOROthiazide (HYDRODIURIL) 25 MG tablet Take 1 tablet (25 mg total) by mouth daily. TAKE ONE (1) TABLET BY MOUTH EVERY DAY 90 tablet 1   ??? insulin glargine (BASAGLAR, LANTUS) 100 unit/mL (3 mL) injection pen 18 units (Patient not taking: Reported on 04/05/2018) 30 pen 3   ??? insulin lispro (HUMALOG) 100 unit/mL injection pen Inject 6 Units under the skin Three (3) times a day before meals. 5.4 mL 0   ??? insulin lispro (HUMALOG) 100 unit/mL injection Inject 0.01-0.2 mL (1-20 Units total) under the skin Four (4) times a day (before meals and nightly). 30 mL 0   ??? ipratropium (ATROVENT) 0.02 % nebulizer solution INHALE 1  VIAL IN NEBULIZER 4 TIMES DAILY 300 mL 0   ??? MEDICAL SUPPLY ITEM Accucheck Glide Glocumeter for home glucose checks. Check blood glucose ACHS. 1 Package 0   ??? mepolizumab 100 mg/mL AtIn Inject 100 mg under the skin every twenty-eight (28) days. 1 mL 12   ??? metFORMIN (GLUCOPHAGE) 1000 MG tablet Take 1 tablet (1,000 mg total) by mouth 2 (two) times a day with meals. 180 tablet 1   ??? miscellaneous medical supply Misc Nebulizer machine and tubing. Patients machine is 55 years old. 1 each 0   ??? NOVOLOG U-100 INSULIN ASPART 100 unit/mL injection      ??? OXYGEN-AIR DELIVERY SYSTEMS MISC Dose: 2-3 LPM     ??? posaconazole (NOXAFIL) 100 mg TbEC delayed released tablet Take 200 mg by mouth daily. 60 tablet 2   ??? pravastatin (PRAVACHOL) 40 MG tablet Take 1 tablet (40 mg total) by mouth daily with evening meal. 90 tablet 1   ??? predniSONE (DELTASONE) 10 MG tablet Take 1 tablet (10 mg total) by mouth daily. 90 tablet 0   ??? sodium chloride 7% 7 % Nebu Inhale 1 vial (4 mL) by nebulization Two (2) times a day. 720 mL 3     No current facility-administered medications for this visit.        No Known Allergies    Patient Active Problem List   Diagnosis   ??? Pulmonary sarcoidosis (CMS-HCC)   ??? HTN (hypertension)   ??? Aspergillus fumigatus (CMS-HCC)   ??? Wheezing   ??? Type 2 diabetes mellitus with hyperglycemia (CMS-HCC)   ??? Hemoptysis   ??? Bronchiectasis type 1 (CMS-HCC)   ??? Cavitary lesion of lung   ??? Invasive pulmonary aspergillosis (CMS-HCC)   ??? COPD (chronic obstructive pulmonary disease) (CMS-HCC)   ??? Dyspnea on exertion   ??? Hypoxia   ??? Chronic pulmonary aspergillosis (CMS-HCC)   ??? Eosinophilic asthma (CMS-HCC)   ??? 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 Eosinophilia       Reviewed and up to date in Epic.    Appropriateness of Therapy     Is medication and dose appropriate based on diagnosis? Yes    Baseline Quality of Life Assessment      How many days over the past month did your severe persistent asthma keep you from your normal activities? 3 weeks    Financial Information     Medication Assistance provided: Prior Authorization    Anticipated copay of $0 reviewed with patient. Verified delivery address.    Delivery Information     Scheduled delivery date: 07/10/2018    Expected start date: 07/10/2018    Medication will be delivered via UPS to the home address in [facility].  This shipment will not require a signature.      Explained the services we provide at [facility] and that each month we would call to set up refills.  Stressed importance of returning phone calls so that we could ensure they receive their medications in time each month.  Informed patient that we should be setting up refills 7-10 days prior to when they will run out of medication.  A pharmacist will reach out to perform a clinical assessment periodically.  Informed patient that a welcome packet and a drug information handout will be sent.      Patient verbalized understanding of the above information as well as how to contact the pharmacy at [phone] option 4 with any questions/concerns.  The  pharmacy is open Monday through Friday 8:30am-4:30pm.  A pharmacist is available 24/7 via pager to answer any clinical questions they may have.    Patient Specific Needs     ? Does the patient have any physical, cognitive, or cultural barriers? No    ? Patient prefers to have medications discussed with  Patient     ? Is the patient able to read and understand education materials at a high school level or above? No    ? Patient's primary language is  English     ? Is the patient high risk? No     ? Does the patient require a Care Management Plan? No     ? Does the patient require physician intervention or other additional services (i.e. nutrition, smoking cessation, social work)? No      Julianne Rice  [facility] Specialty Pharmacist

## 2018-07-08 NOTE — Unmapped (Signed)
Phoned patient to discuss next steps for starting biologic (Nucala) and appt for teaching.     Left her number to clinic nurse line. Will also try and reach her again tomorrow.

## 2018-07-11 MED FILL — EMPTY CONTAINER: 120 days supply | Qty: 1 | Fill #0

## 2018-07-11 MED FILL — EMPTY CONTAINER: 120 days supply | Qty: 1 | Fill #0 | Status: AC

## 2018-07-11 MED FILL — NUCALA 100 MG/ML SUBCUTANEOUS AUTO-INJECTOR: 28 days supply | Qty: 1 | Fill #0 | Status: AC

## 2018-07-29 NOTE — Unmapped (Signed)
Jul 29, 2018 1:54 PM     Spoke with patient regarding ongoing symptoms of shortness of breath. Started Nucala aproximately 2.5 weeks ago and has been on prolonged steroid taper. She is now down to 5 mg of Prednisone daily. She is feeling well and shortness of breath has resolved. No fevers, cough, or sputum production. No dyspnea on exertion. Next dose of Nucala is in a couple of weeks.     Plan:  - Continue Pred 5 mg daily through Sunday May 17 then taper to Pred 5 mg every other day until next Nucala injection  - Stop Pred at next Nucala injection  - If symptoms worsen at any time, go back to previous dosing regimen from most recent taper (daily from every-other-day, 10 mg from 5 mg, etc.)      Morene Antu, MD  Pulmonary & Critical Care Fellow  Pager [phone]   Jul 29, 2018

## 2018-08-06 NOTE — Unmapped (Signed)
E-faxed requested clinic note to [facility].

## 2018-08-08 NOTE — Unmapped (Signed)
Called pt to do pre-visit telephone rooming for 08/09/18  Phone / Video visit. Left VM requesting a call back.

## 2018-08-09 ENCOUNTER — Institutional Professional Consult (permissible substitution): Admit: 2018-08-09 | Discharge: 2018-08-10 | Payer: MEDICARE

## 2018-08-09 DIAGNOSIS — J82 Pulmonary eosinophilia, not elsewhere classified: Secondary | ICD-10-CM

## 2018-08-09 DIAGNOSIS — D860 Sarcoidosis of lung: Principal | ICD-10-CM

## 2018-08-09 DIAGNOSIS — B441 Other pulmonary aspergillosis: Secondary | ICD-10-CM

## 2018-08-09 MED ORDER — METFORMIN 1,000 MG TABLET
ORAL_TABLET | Freq: Two times a day (BID) | ORAL | 1 refills | 0.00000 days | Status: CP
Start: 2018-08-09 — End: 2019-08-09

## 2018-08-09 MED ORDER — GABAPENTIN 300 MG CAPSULE
ORAL_CAPSULE | Freq: Three times a day (TID) | ORAL | 1 refills | 0 days | Status: CP
Start: 2018-08-09 — End: 2019-08-10

## 2018-08-09 MED ORDER — PRAVASTATIN 40 MG TABLET
ORAL_TABLET | Freq: Every day | ORAL | 1 refills | 0 days | Status: CP
Start: 2018-08-09 — End: 2018-11-18

## 2018-08-09 MED FILL — NUCALA 100 MG/ML SUBCUTANEOUS AUTO-INJECTOR: 28 days supply | Qty: 1 | Fill #1 | Status: AC

## 2018-08-09 MED FILL — NUCALA 100 MG/ML SUBCUTANEOUS AUTO-INJECTOR: SUBCUTANEOUS | 28 days supply | Qty: 1 | Fill #1

## 2018-08-09 NOTE — Unmapped (Signed)
[facility] Clinical Assessment & Refill Coordination Note    Karen Barber, DOB: 1965-04-08  Phone: [phone] (home)     All above HIPAA information was verified with patient.    Specialty Medication(s):   CF/Pulmonary: -Nucala 100mg /ml     Current Outpatient Medications   Medication Sig Dispense Refill   ??? ACCU-CHEK FASTCLIX LANCET DRUM Misc USE TO CHECK GLUCOSE 4 TIMES DAILY BEFORE MEAL(S) AND NIGHTLY     ??? ACCU-CHEK GUIDE GLUCOSE METER Misc 1 Device by Other route Four (4) times a day (before meals and nightly). AS DIRECTED 1 kit 0   ??? albuterol 2.5 mg /3 mL (0.083 %) nebulizer solution Inhale 3 mL (2.5 mg total) by nebulization every six (6) hours as needed for wheezing or shortness of breath (airway clearance/cough). 120 vial 11   ??? albuterol HFA 90 mcg/actuation inhaler Inhale 2 puffs every four (4) hours as needed for wheezing or shortness of breath. 2 Inhaler 11   ??? blood sugar diagnostic Strp by Other route Four (4) times a day (before meals and nightly). 400 strip 1   ??? budesonide-formoterol (SYMBICORT) 160-4.5 mcg/actuation inhaler Inhale 2 puffs Two (2) times a day. 3 Inhaler 2   ??? cyclobenzaprine (FLEXERIL) 10 MG tablet Take 1 tablet (10 mg total) by mouth Three (3) times a day as needed. 90 each 2   ??? empty container Misc USE AS DIRECTED 1 each 2   ??? EPINEPHrine (EPIPEN 2-PAK) 0.3 mg/0.3 mL injection Inject 0.3 mL (0.3 mg total) into the muscle once for 1 dose. 1 Device 1   ??? gabapentin (NEURONTIN) 300 MG capsule Take 1 capsule (300 mg total) by mouth Three (3) times a day. 90 capsule 1   ??? hydroCHLOROthiazide (HYDRODIURIL) 25 MG tablet Take 1 tablet (25 mg total) by mouth daily. TAKE ONE (1) TABLET BY MOUTH EVERY DAY 90 tablet 1   ??? insulin glargine (BASAGLAR, LANTUS) 100 unit/mL (3 mL) injection pen 18 units (Patient not taking: Reported on 04/05/2018) 30 pen 3   ??? insulin lispro (HUMALOG) 100 unit/mL injection pen Inject 6 Units under the skin Three (3) times a day before meals. 5.4 mL 0   ??? insulin lispro (HUMALOG) 100 unit/mL injection Inject 0.01-0.2 mL (1-20 Units total) under the skin Four (4) times a day (before meals and nightly). 30 mL 0   ??? ipratropium (ATROVENT) 0.02 % nebulizer solution INHALE 1 VIAL IN NEBULIZER 4 TIMES DAILY 300 mL 0   ??? MEDICAL SUPPLY ITEM Accucheck Glide Glocumeter for home glucose checks. Check blood glucose ACHS. 1 Package 0   ??? mepolizumab 100 mg/mL AtIn Inject 100 mg under the skin every twenty-eight (28) days. 1 mL 12   ??? metFORMIN (GLUCOPHAGE) 1000 MG tablet Take 1 tablet (1,000 mg total) by mouth 2 (two) times a day with meals. 180 tablet 1   ??? miscellaneous medical supply Misc Nebulizer machine and tubing. Patients machine is 55 years old. 1 each 0   ??? NOVOLOG U-100 INSULIN ASPART 100 unit/mL injection      ??? OXYGEN-AIR DELIVERY SYSTEMS MISC Dose: 2-3 LPM     ??? posaconazole (NOXAFIL) 100 mg TbEC delayed released tablet Take 200 mg by mouth daily. 60 tablet 2   ??? pravastatin (PRAVACHOL) 40 MG tablet Take 1 tablet (40 mg total) by mouth daily with evening meal. 90 tablet 1   ??? predniSONE (DELTASONE) 10 MG tablet Take 1 tablet (10 mg total) by mouth daily. 90 tablet  0     No current facility-administered medications for this visit.         Changes to medications: now takes prednisone 5mg  every other day    No Known Allergies    Changes to allergies: No    SPECIALTY MEDICATION ADHERENCE     Nucala 100 mg/ml: 0 days of medicine on hand             Specialty medication(s) dose(s) confirmed: Regimen is correct and unchanged.     Are there any concerns with adherence? No    Adherence counseling provided? Not needed    CLINICAL MANAGEMENT AND INTERVENTION      Clinical Benefit Assessment:    Do you feel the medicine is effective or helping your condition? Yes. She has been able to do more this past month    Clinical Benefit counseling provided? Not needed    Adverse Effects Assessment:    Are you experiencing any side effects? No    Are you experiencing difficulty administering your medicine? No    Quality of Life Assessment:    How many days over the past month did your asthma  keep you from your normal activities? For example, brushing your teeth or getting up in the morning. 0    Have you discussed this with your provider? Not needed    Therapy Appropriateness:    Is therapy appropriate? Yes, therapy is appropriate and should be continued    DISEASE/MEDICATION-SPECIFIC INFORMATION      N/A    PATIENT SPECIFIC NEEDS     ? Does the patient have any physical, cognitive, or cultural barriers? No    ? Is the patient high risk? No     ? Does the patient require a Care Management Plan? No     ? Does the patient require physician intervention or other additional services (i.e. nutrition, smoking cessation, social work)? No      SHIPPING     Specialty Medication(s) to be Shipped:   CF/Pulmonary: -Nucala 100/ml    Other medication(s) to be shipped: n/a     Changes to insurance: No    Delivery Scheduled: Yes, Expected medication delivery date: 08/09/18.     Medication will be delivered via Same Day Courier to the confirmed home address in [facility].    The patient will receive a drug information handout for each medication shipped and additional FDA Medication Guides as required.  Verified that patient has previously received a Conservation officer, historic buildings.    All of the patient's questions and concerns have been addressed.    Julianne Rice   [facility] Specialty Pharmacist

## 2018-08-09 NOTE — Unmapped (Signed)
ID Clinic Note - Follow-up Visit    ASSESSMENT AND PLAN:  53yoF w/ sarcoidosis and h/o chronic cavitary pulmonary aspergillosis in 2010 s/p LUL lobectomy w/ VATS on 12/03/14 who had frequent hospitalizations in 2018 for respiratory issues/infections, including in 09/2016 when she was found to have a L apical thick-walled cavitary lesion w/ adjacent lung parenchymal abnormalities and sputum culture positive for Aspergillus [place] and Achromobacter spp. Treated with voriconazole for chronic pulmonary aspergillosis, transitioned to posaconazole to reduce potential toxicities after discussing risks and benefits of stopping therapy completely as it was unlikely to be curative and she is not a transplant candidate due to anatomical concerns.  ??  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 She continues to do well now on Nucala despite tapering steroids. Will continue posaconazole for now and re-address risks and benefits in ~2 months.    Today:  - CONTINUE posaconazole 200mg  daily while on steroids  - Patient to get safety/monitoring labs next week at PCP  - No further M. fortuitum treatment.  ??  ID Problem List:  Sarcoidosis 1995  - Only immunomodulator: intermittent systemic steroid bursts/tapers, but none recently  - On 2L Home O2, 3L w/ activity (her baseline)  - Worsening respiratory symptoms w/ mediastinal lymphadenopathy and fibrosis progressive on 01/2018 CT concerning for progression of sarcoidosis  - Follows with Pulmonology, Dr. Kearney Hard  - Not a transplant candidate 2/2 anatomy after past surgery  ??  Chronic cavitary pulmonary aspergillosis, first diagnosed 2010  - previously treated with multiple courses of voriconazole  - 2016 cultures with Aspergillus fumigatus  - s/p LUL lobectomy/VATS 12/03/14 after episodes of hemoptysis  - s/p voriconazole 11/20/14-04/2015   - New L apical thick walled cavitary lesion and adjacent lung parenchymal abnormalities 09/08/16 in setting of 1 month of worsening pulmonary??infectious??symptoms, leukocytosis (on steroids)  - 6/22 and 6/23 sputum cultures: Aspergillus [place], OPF.  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  - 02/13/2018 CT: decreased size of L apical cavity soft tissue density  - Posaconazole 03/2018 - (dose decreased based on levels to 200mg  daily  ??  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  - S/p 7 weeks of TMP/sulf + moxifloxacin from 04/2018 - 06/2018 without improvement  ??  Pertinent past Infections  - H/o Achromobacter pneumonia 11/18/14, treated w meropenem x 14 d; 09/25/16, treated with 4 weeks meropenem  - H/o Stenotrophomonas (S: minocycline, I: levoflox R: bactrim, ceftaz)??PNA 12/03/14 treated with minocycline x 14d  - Hepatitis C Ab-positive, VL neg  - Nutritionally variant Strep bacteremia, 06/2014  ??  ??  Education and counseling took 10 minutes of today's visit.  ??  Duanne Limerick, MD, MHS  Fellow, [facility] Division of Infectious Diseases  ??  [facility]   1st floor [facility]  [address]  [place], [place]. [address]   Phone: [phone]   Fax: [phone]   ??  _____________________________________________________________________  ??  ??  CC: F/u of mycetoma, Achromobacter PNA.  ??  ??  HPI: ??53yoF w/ h/o chronic cavitary pulmonary aspergillosis. s/p resection in 2016 who was admitted in  with increasing sputum production, fever, and cough??after being recently hospitalized and restarted on voriconazole for new Aspergillus [place]??infection. Found to have a likely bacterial PNA superimposed on pulmonary aspergillosis with consolidation and likely mycetoma on chest CT. LRCx grew Achromobacter and Asperigillus [place].??Not a surgical candidate as her pulmonary function is not sufficient to allow for the required pneumonectomy. Discharged on meropenem and voriconazole. Completed 4 weeks of meropenem, has continued on voriconazole since July 2018. Trough checks remained therapeutic to slightly sub therapeutic. Has never had any vision changes, headaches, liver dysfunction.    Developed worsening SOB and increased O2 and was hospitalized in Nov 2019. CT scan showed improvement in soft tissue cavitary density but worsening fibrosis and mediastinal LAD. Treated with steroids and amox/clav. While she was on the steroids, states she felt great, best she's felt in 2 years, able to be more more active. However, after stopping the antibiotics + steroids, she went back to being winded and feeling like her heart is racing with activity.    At last visit, we switched from voriconazole to posaconazole to decrease risk of adverse events and need for monitoring.    Since last visit:  - Followed closely with pulmonology. Because of increasing SOB and repeat sputum cx from 04/25/18 again positive for mycobacterium fortuitum, started on TMP/sulfa + moxifloxacin 05/13/18 for a planned 3 month trial to see if there was clinical or imaging improvement.  -  No improvement with antibiotic therapy and in fact had worsening hypoxia and SOB. Started on prednisone 06/25/18, and had quick improvement in symptoms. Therefore, stopped TMP/sulfa and moxi after ~7 weeks in April  - Started on [place] by primary pulmonologist at end of April and has continued to taper steroids. Since then, SOB has resolved, and she is feeling well. Currently on 5mg  every other day of steroids. Next injection at the end of this month.    Currently reports doing well. On her baseline 3L and up to 4L with activity. Able to go out and garden, move around without limitation. Staying at home for the most part to avoid coronavirus exposure. Energy is much better. No fevers, night sweats/chills, weight loss.      Past Medical History:   Diagnosis Date   ??? 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 Breast injury     lung surg on left incision under breast sept 2016   ??? Hepatitis C antibody test positive 2016    repeatedly negative HCV RNA, indicating clearance of infection w/o treatment   ??? Hyperlipidemia    ??? Hypertension    ??? 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 On home O2 2008    per Dr Mal Amabile note from 02/21/2017   ??? Pulmonary aspergillosis (CMS-HCC) 2016    A.fumigatus in 2016, prior to LUL lobectomy. A.niger from sputum 08/2016-09/2016.   ??? 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 Sarcoidosis 1995   ??? Type 2 diabetes mellitus with hyperglycemia (CMS-HCC) 07/27/2014       Past Surgical History:   Procedure Laterality Date   ??? LUNG LOBECTOMY     ??? PR THORACOSCOPY SURG TOT PULM DECORT Left 12/14/2014    Procedure: THORACOSCOPY  SURG; W/TOT PULM DECORTIC/PNEUMOLYS;  Surgeon: Evert Kohl, MD;  Location: MAIN OR [facility];  Service: Cardiothoracic   ??? PR THORACOSCOPY W/THERA WEDGE RESEXN INITIAL UNILAT Left 12/03/2014    Procedure: THORACOSCOPY, SURGICAL; WITH THERAPEUTIC WEDGE RESECTION (EG, MASS, NODULE) INITIAL UNILATERAL;  Surgeon: Evert Kohl, MD;  Location: MAIN OR [facility];  Service: Cardiothoracic       No Known Allergies    Current Outpatient Medications   Medication Sig Dispense Refill   ??? ACCU-CHEK FASTCLIX LANCET DRUM Misc USE TO CHECK GLUCOSE 4 TIMES DAILY BEFORE MEAL(S) AND NIGHTLY     ??? ACCU-CHEK GUIDE GLUCOSE METER Misc 1 Device by Other route Four (4) times a day (before meals and nightly). AS DIRECTED 1 kit 0   ??? albuterol 2.5 mg /3 mL (0.083 %) nebulizer solution Inhale 3 mL (2.5 mg total) by nebulization every six (6) hours as needed for wheezing or shortness of breath (airway clearance/cough). 120 vial 11   ??? albuterol HFA 90 mcg/actuation inhaler Inhale 2 puffs every four (4) hours as needed for wheezing or shortness of breath. 2 Inhaler 11   ??? blood sugar diagnostic Strp by Other route Four (4) times a day (before meals and nightly). 400 strip 1   ??? budesonide-formoterol (SYMBICORT) 160-4.5 mcg/actuation inhaler Inhale 2 puffs Two (2) times a day. 3 Inhaler 2   ??? cyclobenzaprine (FLEXERIL) 10 MG tablet Take 1 tablet (10 mg total) by mouth Three (3) times a day as needed. 90 each 2   ??? empty container Misc USE AS DIRECTED 1 each 2   ??? EPINEPHrine (EPIPEN 2-PAK) 0.3 mg/0.3 mL injection Inject 0.3 mL (0.3 mg total) into the muscle once for 1 dose. 1 Device 1   ??? gabapentin (NEURONTIN) 300 MG capsule Take 1 capsule (300 mg total) by mouth Three (3) times a day. 90 capsule 1   ??? hydroCHLOROthiazide (HYDRODIURIL) 25 MG tablet Take 1 tablet (25 mg total) by mouth daily. TAKE ONE (1) TABLET BY MOUTH EVERY DAY 90 tablet 1   ??? insulin glargine (BASAGLAR, LANTUS) 100 unit/mL (3 mL) injection pen 18 units (Patient not taking: Reported on 04/05/2018) 30 pen 3   ??? insulin lispro (HUMALOG) 100 unit/mL injection pen Inject 6 Units under the skin Three (3) times a day before meals. 5.4 mL 0   ??? insulin lispro (HUMALOG) 100 unit/mL injection Inject 0.01-0.2 mL (1-20 Units total) under the skin Four (4) times a day (before meals and nightly). 30 mL 0   ??? ipratropium (ATROVENT) 0.02 % nebulizer solution INHALE 1 VIAL IN NEBULIZER 4 TIMES DAILY 300 mL 0   ??? MEDICAL SUPPLY ITEM Accucheck Glide Glocumeter for home glucose checks. Check blood glucose ACHS. 1 Package 0   ??? mepolizumab 100 mg/mL AtIn Inject 100 mg under the skin every twenty-eight (28) days. 1 mL 12   ??? metFORMIN (GLUCOPHAGE) 1000 MG tablet Take 1 tablet (1,000 mg total) by mouth 2 (two) times a day with meals. 180 tablet 1   ??? miscellaneous medical supply Misc Nebulizer machine and tubing. Patients machine is 55 years old. 1 each 0   ??? NOVOLOG U-100 INSULIN ASPART 100 unit/mL injection      ??? OXYGEN-AIR DELIVERY SYSTEMS MISC Dose: 2-3 LPM     ??? posaconazole (NOXAFIL) 100 mg TbEC delayed released tablet Take 200 mg by mouth daily. 60 tablet 2   ??? pravastatin (PRAVACHOL) 40 MG tablet Take 1 tablet (40 mg total) by mouth daily with evening meal. 90  tablet 1   ??? predniSONE (DELTASONE) 10 MG tablet Take 1 tablet (10 mg total) by mouth daily. 90 tablet 0     No current facility-administered medications for this visit.        Social history:  Reviewed and unchanged except as noted in the HPI.    Family History   Problem Relation Age of Onset   ??? Diabetes Father    ??? Diabetes Brother    ??? Diabetes Maternal Aunt    ??? Sarcoidosis Maternal Aunt    ??? Diabetes Maternal Uncle    ??? Diabetes Paternal Aunt    ??? Diabetes Paternal Uncle        ROS: 12 systems reviewed and negative except as per HPI.     PE:  No physical exam as this was a telephone visit.    Labs:    Lab Results   Component Value Date    ALKPHOS 108 05/22/2018    BILITOT 0.6 05/22/2018    BILIDIR <0.10 12/14/2017    PROT 8.7 (H) 05/22/2018    ALBUMIN 4.2 05/22/2018    ALT 10 05/22/2018    AST 18 05/22/2018     Lab Results   Component Value Date    WBC 9.1 05/22/2018    HGB 11.2 (L) 05/22/2018    HCT 37.2 05/22/2018    MCV 85.2 05/22/2018    RDW 13.3 05/22/2018    PLT 463 (H) 05/22/2018    NEUTROPCT 55.0 05/22/2018    LYMPHOPCT 22.2 05/22/2018    MONOPCT 12.2 05/22/2018    EOSPCT 6.0 05/22/2018    BASOPCT 0.7 05/22/2018         Drug monitoring:  [place] level 05/22/18: 2,552 --> dose decreased to 200mg  daily      Microbiology:  2020:  04/25/18: sputum AFB cx +mycobacterium fortuitum    2019:  02/14/18: OPF, fungal neg, AFB NGTD; galactomannan <0.5, fungitell neg  9/19, 9/29: AFB cx +mycobacterium fortuitum  Mycobacterium fortuitum group       MIC SUSCEPTIBILITY RESULT     Amikacin 2  Susceptible     Cefoxitin 16  Susceptible     Ciprofloxacin 0.5  Susceptible     Clarithromycin >16  Resistant1     Doxycycline 4  Intermediate     Imipenem 8  Intermediate2     Linezolid 2  Susceptible     Minocycline 2  Intermediate     Moxifloxacin 0.25  Susceptible     Tigecycline 0.03  No Interpretation     Trimethoprim + Sulfamethoxazole 0.25  Susceptible      2018:  02/19/17: OPF, fungal neg, AFB neg  7/12 LRCx: Aspergillus [place], 3+ Achromobacter species, 3+ Enterococcus faecium  7/10 Sputum fungal culture: Aspergillus [place]  7/9 LRCx: Aspergillus [place], OP flora, Achromobacter spp.    AFB smear x3 negative; cx NGTD      Imaging:   No recent imaging.    I spent 15 minutes on the phone with the patient. I spent an additional 15 minutes on pre- and post-visit activities.     The patient was physically located in [place] or a state in which I am permitted to provide care. The patient and/or parent/gauardian understood that s/he may incur co-pays and cost sharing, and agreed to the telemedicine visit. The visit was completed via phone and/or video, which was appropriate and reasonable under the circumstances given the patient's presentation at the time.    The patient and/or parent/guardian has been advised of  the potential risks and limitations of this mode of treatment (including, but not limited to, the absence of in-person examination) and has agreed to be treated using telemedicine. The patient's/patient's family's questions regarding telemedicine have been answered.     If the phone/video visit was completed in an ambulatory setting, the patient and/or parent/guardian has also been advised to contact their provider???s office for worsening conditions, and seek emergency medical treatment and/or call 911 if the patient deems either necessary.

## 2018-08-09 NOTE — Unmapped (Signed)
It was good to talk to you today! I'm glad you're doing well. Please go to your primary care doctor first thing in the AM (before your AM posaconazole dose) to get labs done sometime next week. I will call you if any results are abnormal.

## 2018-08-16 MED ORDER — PRAVASTATIN 40 MG TABLET
ORAL_TABLET | Freq: Every day | ORAL | 1 refills | 0.00000 days | Status: CP
Start: 2018-08-16 — End: ?

## 2018-08-26 NOTE — Unmapped (Signed)
August 26, 2018 12:00 PM     Spoke with patient for follow up re: Nucala and ongoing Prednisone use. She reports symptoms have entirely resolved since starting Nucala in April 2020 despite continued weaning of Prednisone. Currently is taking Prednisone 5 mg every other day and has no symptoms on days when she does not take Prednisone. I advised her to stop Prednisone completely to assess if symptoms return, but expect they should be well-controlled with the Nucala only as well as suppression of her chronic aspergillosis with Posaconazole and continued use of her supplemental oxygen. I also stated that if her symptoms were to recur (cough, shortness of breath, severe fatigue, oxygen desaturations), she could resume her Prednisone at 5 mg daily and reach out to our clinic for further advice. She is in agreement with this plan.    Severe Eosinophilic Asthma and Sarcoidosis:  - Continue home Nucala injections  - Continue current inhaled therapies  - Continue supplemental oxygen with portable NIV  - Stop Prednisone 5 mg every other day. Can resume at 5 mg daily if symptoms recur.    Follow up in 2-3 months.    Morene Antu, MD  Pulmonary and Critical Care Fellow  [address]  [place], [place] [address]  Phone:  [phone]  Fax:  [phone]  08/26/18

## 2018-08-28 ENCOUNTER — Other Ambulatory Visit: Admit: 2018-08-28 | Discharge: 2018-08-29 | Payer: MEDICARE

## 2018-08-28 DIAGNOSIS — B441 Other pulmonary aspergillosis: Principal | ICD-10-CM

## 2018-08-28 LAB — COMPREHENSIVE METABOLIC PANEL
ALKALINE PHOSPHATASE: 73 U/L (ref 38–126)
ALT (SGPT): 11 U/L (ref <35)
ANION GAP: 13 mmol/L (ref 7–15)
AST (SGOT): 15 U/L (ref 14–38)
BLOOD UREA NITROGEN: 24 mg/dL — ABNORMAL HIGH (ref 7–21)
BUN / CREAT RATIO: 19
CALCIUM: 9.7 mg/dL (ref 8.5–10.2)
CHLORIDE: 101 mmol/L (ref 98–107)
CO2: 25 mmol/L (ref 22.0–30.0)
CREATININE: 1.25 mg/dL — ABNORMAL HIGH (ref 0.60–1.00)
EGFR CKD-EPI AA FEMALE: 56 mL/min/{1.73_m2} — ABNORMAL LOW (ref >=60)
EGFR CKD-EPI NON-AA FEMALE: 49 mL/min/{1.73_m2} — ABNORMAL LOW (ref >=60)
GLUCOSE RANDOM: 136 mg/dL (ref 70–179)
POTASSIUM: 5 mmol/L (ref 3.5–5.0)
PROTEIN TOTAL: 7.8 g/dL (ref 6.5–8.3)
SODIUM: 139 mmol/L (ref 135–145)

## 2018-08-28 LAB — MEAN CORPUSCULAR VOLUME: Lab: 84.3

## 2018-08-28 LAB — MAGNESIUM: Magnesium:MCnc:Pt:Ser/Plas:Qn:: 1.5 — ABNORMAL LOW

## 2018-08-28 LAB — CBC
HEMOGLOBIN: 11.6 g/dL — ABNORMAL LOW (ref 12.0–16.0)
MEAN CORPUSCULAR HEMOGLOBIN CONC: 31.5 g/dL (ref 31.0–37.0)
MEAN CORPUSCULAR VOLUME: 84.3 fL (ref 80.0–100.0)
PLATELET COUNT: 304 10*9/L (ref 150–440)
RED BLOOD CELL COUNT: 4.37 10*12/L (ref 4.00–5.20)
RED CELL DISTRIBUTION WIDTH: 15.9 % — ABNORMAL HIGH (ref 12.0–15.0)
WBC ADJUSTED: 6.5 10*9/L (ref 4.5–11.0)

## 2018-08-28 LAB — CREATININE: Creatinine:MCnc:Pt:Ser/Plas:Qn:: 1.25 — ABNORMAL HIGH

## 2018-08-28 LAB — PHOSPHORUS: Phosphate:MCnc:Pt:Ser/Plas:Qn:: 4.3

## 2018-08-29 LAB — POSACONAZOLE LEVEL: Lab: 1636

## 2018-09-09 NOTE — Unmapped (Signed)
[facility] Refill Coordination Note2    Specialty Medication(s) to be Shipped:   CF/Pulmonary: -Nucala 100mg /ml    Other medication(s) to be shipped: sodium chloride 3% nebulizer solution     Karen Barber, DOB: [birth year]  Phone: [phone] (home)       All above HIPAA information was verified with patient.     Completed refill call assessment today to schedule patient's medication shipment from the [facility] [phone]).       Specialty medication(s) and dose(s) confirmed: Regimen is correct and unchanged.   Changes to medications: Karen Barber reports no changes at this time.  Changes to insurance: No  Questions for the pharmacist: No    Confirmed patient received Welcome Packet with first shipment. The patient will receive a drug information handout for each medication shipped and additional FDA Medication Guides as required.       DISEASE/MEDICATION-SPECIFIC INFORMATION        For patients on injectable medications: Patient currently has 0 doses left.  Next injection is scheduled for 09/10/18.    SPECIALTY MEDICATION ADHERENCE2                    SHIPPING     Shipping address confirmed in Epic.     Delivery Scheduled: Yes, Expected medication delivery date: 09/10/18.     Medication will be delivered via Same Day Courier to the home address in Epic [place].    Karen Barber   [facility] Specialty Technician

## 2018-09-09 NOTE — Unmapped (Signed)
Error, see most recent refill note

## 2018-09-10 MED FILL — SODIUM CHLORIDE 3 % FOR NEBULIZATION: 30 days supply | Qty: 240 | Fill #0 | Status: AC

## 2018-09-10 MED FILL — NUCALA 100 MG/ML SUBCUTANEOUS AUTO-INJECTOR: 28 days supply | Qty: 1 | Fill #2 | Status: AC

## 2018-09-10 MED FILL — NUCALA 100 MG/ML SUBCUTANEOUS AUTO-INJECTOR: SUBCUTANEOUS | 28 days supply | Qty: 1 | Fill #2

## 2018-09-12 NOTE — Unmapped (Signed)
Per April 2020 note by primary pulmonologist, bactrim therapy stopped after discussion with infectious disease

## 2018-09-12 NOTE — Unmapped (Signed)
Pharmacy Refill Request  Bactrim    Last OV:   4.7.2020

## 2018-09-18 NOTE — Unmapped (Signed)
Pre-chart, verbal GCT

## 2018-09-19 ENCOUNTER — Telehealth: Admit: 2018-09-19 | Discharge: 2018-09-20 | Payer: MEDICARE

## 2018-09-19 DIAGNOSIS — D860 Sarcoidosis of lung: Secondary | ICD-10-CM

## 2018-09-19 DIAGNOSIS — B4489 Other forms of aspergillosis: Secondary | ICD-10-CM

## 2018-09-19 DIAGNOSIS — B441 Other pulmonary aspergillosis: Secondary | ICD-10-CM

## 2018-09-19 DIAGNOSIS — R0602 Shortness of breath: Principal | ICD-10-CM

## 2018-09-19 DIAGNOSIS — J82 Pulmonary eosinophilia, not elsewhere classified: Secondary | ICD-10-CM

## 2018-09-19 DIAGNOSIS — R0609 Other forms of dyspnea: Secondary | ICD-10-CM

## 2018-09-19 MED ORDER — AMOXICILLIN 875 MG-POTASSIUM CLAVULANATE 125 MG TABLET
ORAL_TABLET | Freq: Two times a day (BID) | ORAL | 0 refills | 0.00000 days | Status: CP
Start: 2018-09-19 — End: 2018-09-26

## 2018-09-19 NOTE — Unmapped (Signed)
ID Clinic Note - Follow-up Visit    ASSESSMENT AND PLAN:  53yoF w/ sarcoidosis and h/o chronic cavitary pulmonary aspergillosis in 2010 s/p LUL lobectomy w/ VATS on 12/03/14 who had frequent hospitalizations in 2018 for respiratory issues/infections, including in 09/2016 when she was found to have a L apical thick-walled cavitary lesion w/ adjacent lung parenchymal abnormalities and sputum culture positive for Aspergillus [place] and Achromobacter spp. Treated with voriconazole for chronic pulmonary aspergillosis, transitioned to posaconazole to reduce potential toxicities after discussing risks and benefits of stopping therapy completely as it was unlikely to be curative and she is not a transplant candidate due to anatomical concerns.  ??  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     With stopping steroids completely last month, had worsening symptoms that improved when she started herself back on steroids and then low-dose amox/clav (a quarter of the treatment dose). Favor that restarting steroids was what let to her partial recovery, but can't exclude CAP as well with increasing O2 requirement. Will provide a treatment course for CAP and work taper steroids back to 5mg  while working to get her back in to pulmonology clinic to discuss further treatment options for eosinophilic asthma and sarcoidosis.    Today:  - CONTINUE posaconazole 200mg  daily while on steroids. Obtaining safety labs through PCP ~monthly  - Amox/clav 875-125mg  BID x 7 days, then stop  - Continue prednisone taper - will go down to 20mg  daily July 3, then 10mg  daily July 10, then 5mg  daily July 17 and hold at that does until she sees Pulmonology.    ??  ID Problem List:  Sarcoidosis 1995; Severe Eosinophilic Asthma  - Only immunomodulator: intermittent systemic steroid bursts/tapers, more frequent recently  - On 3L Home O2, 4L w/ activity (her baseline)  - Worsening respiratory symptoms w/ mediastinal lymphadenopathy and fibrosis progressive on 01/2018 CT concerning for progression of sarcoidosis  - Follows with Pulmonology, Dr. Kearney Hard  - Not a transplant candidate 2/2 anatomy after past surgery  ??  Chronic cavitary pulmonary aspergillosis, first diagnosed 2010  - previously treated with multiple courses of voriconazole  - 2016 cultures with Aspergillus fumigatus  - s/p LUL lobectomy/VATS 12/03/14 after episodes of hemoptysis  - s/p voriconazole 11/20/14-04/2015   - New L apical thick walled cavitary lesion and adjacent lung parenchymal abnormalities 09/08/16 in setting of 1 month of worsening pulmonary??infectious??symptoms, leukocytosis (on steroids)  - 6/22 and 6/23 sputum cultures: Aspergillus Luxem[place], OPF.  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  - 02/13/2018 CT: decreased size of L apical cavity soft tissue density  - Posaconazole 03/2018 - (dose decreased based on levels to 200mg  daily)  ??  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  - S/p 7 weeks of TMP/sulf + moxifloxacin from 04/2018 - 06/2018 without improvement  ??  Pertinent past Infections  -  H/o Achromobacter pneumonia 11/18/14, treated w meropenem x 14 d; 09/25/16, treated with 4 weeks meropenem  - H/o Stenotrophomonas (S: minocycline, I: levoflox R: bactrim, ceftaz)??PNA 12/03/14 treated with minocycline x 14d  - Hepatitis C Ab-positive, VL neg  - Nutritionally variant Strep bacteremia, 06/2014  ??  ??  Education and counseling took 10 minutes of today's visit.  ??  Duanne Limerick, MD, MHS  Fellow, [facility] Division of Infectious Diseases  ??  [facility]   1st floor [facility]  [address]  [place], [place]. [address]   Phone: [phone]   Fax: [phone]   ??  _____________________________________________________________________  ??  ??  CC: F/u of mycetoma, chronic cavitary pulmonary aspergillosis  ??  HPI: ??53yoF w/ h/o sarcoidosis, severe eosinophilic asthma, chronic cavitary pulmonary aspergillosis s/p resection in 2016 currently on posaconazole for suppressive therapy, and positive M.fortuitum sputum cultures. At last visit was doing well on Nucala per pulmonologist for treatment of severe eosinophilic asthma and prednisone.    Since last visit:  - Weaned off prednisone ~08/26/18. Continued on Nucala.  - Initially did well for about a week, but at the end of June reported increasing side/lung pain started on the L and moving to the R. O2 sats dropped down to 80s with activity, pain with coughing and deep breathing. No fevers or increased cough or more productive cough. Started back taking the prednisone at 30mg  daily about 2 weeks ago and Augmentin about 1 week ago but a half tablet once or twice daily bc she didn't have much left and wanted to make it last.    Currently taking 20mg  of prednisone in the AM and 10mg  at night. Feels back to about 75% of her baseline. Still has twinges of pain. Denies any fevers and cough remains at baseline.      Past Medical History:   Diagnosis Date   ??? 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 Breast injury     lung surg on left incision under breast sept 2016   ??? Hepatitis C antibody test positive 2016 repeatedly negative HCV RNA, indicating clearance of infection w/o treatment   ??? Hyperlipidemia    ??? Hypertension    ??? 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 On home O2 2008    per Dr Mal Amabile note from 02/21/2017   ??? Pulmonary aspergillosis (CMS-HCC) 2016    A.fumigatus in 2016, prior to LUL lobectomy. A.niger from sputum 08/2016-09/2016.   ??? 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 Sarcoidosis 1995   ??? Type 2 diabetes mellitus with hyperglycemia (CMS-HCC) 07/27/2014       Past Surgical History:   Procedure Laterality Date   ??? LUNG LOBECTOMY     ??? PR THORACOSCOPY SURG TOT PULM DECORT Left 12/14/2014    Procedure: THORACOSCOPY SURG; W/TOT PULM DECORTIC/PNEUMOLYS;  Surgeon: Evert Kohl, MD;  Location: MAIN OR [facility];  Service: Cardiothoracic   ??? PR THORACOSCOPY W/THERA WEDGE RESEXN INITIAL UNILAT Left 12/03/2014    Procedure: THORACOSCOPY, SURGICAL; WITH THERAPEUTIC WEDGE RESECTION (EG, MASS, NODULE) INITIAL UNILATERAL;  Surgeon: Evert Kohl, MD;  Location: MA[facility]  Service: Cardiothoracic       No Known Allergies    Current Outpatient Medications   Medication Sig Dispense Refill   ??? ACCU-CHEK FASTCLIX LANCET DRUM Misc USE TO CHECK GLUCOSE 4 TIMES DAILY BEFORE  MEAL(S) AND NIGHTLY     ??? ACCU-CHEK GUIDE GLUCOSE METER Misc 1 Device by Other route Four (4) times a day (before meals and nightly). AS DIRECTED 1 kit 0   ??? albuterol 2.5 mg /3 mL (0.083 %) nebulizer solution Inhale 3 mL (2.5 mg total) by nebulization every six (6) hours as needed for wheezing or shortness of breath (airway clearance/cough). 120 vial 11   ??? albuterol HFA 90 mcg/actuation inhaler Inhale 2 puffs every four (4) hours as needed for wheezing or shortness of breath. 2 Inhaler 11   ??? blood sugar diagnostic Strp by Other route Four (4) times a day (before meals and nightly). 400 strip 1   ??? cyclobenzaprine (FLEXERIL) 10 MG tablet Take 1 tablet (10 mg total) by mouth Three (3) times a day as needed. 90 each 2   ??? empty container Misc USE AS DIRECTED 1 each 2   ??? gabapentin (NEURONTIN) 300 MG capsule Take 1 capsule (300 mg total) by mouth Three (3) times a day. 270 capsule 1   ??? insulin glargine (BASAGLAR, LANTUS) 100 unit/mL (3 mL) injection pen 18 units 30 pen 3   ??? ipratropium (ATROVENT) 0.02 % nebulizer solution INHALE 1 VIAL IN NEBULIZER 4 TIMES DAILY 300 mL 0   ??? MEDICAL SUPPLY ITEM Accucheck Glide Glocumeter for home glucose checks. Check blood glucose ACHS. 1 Package 0   ??? mepolizumab 100 mg/mL AtIn Inject 100 mg under the skin every twenty-eight (28) days. 1 mL 12   ??? metFORMIN (GLUCOPHAGE) 1000 MG tablet Take 1 tablet (1,000 mg total) by mouth 2 (two) times a day with meals. 180 tablet 1   ??? miscellaneous medical supply Misc Nebulizer machine and tubing. Patients machine is 55 years old. 1 each 0   ??? NOVOLOG U-100 INSULIN ASPART 100 unit/mL injection      ??? OXYGEN-AIR DELIVERY SYSTEMS MISC Dose: 2-3 LPM     ??? posaconazole (NOXAFIL) 100 mg TbEC delayed released tablet Take 200 mg by mouth daily. 60 tablet 2   ??? pravastatin (PRAVACHOL) 40 MG tablet Take 1 tablet (40 mg total) by mouth daily with evening meal. 90 tablet 1   ??? pravastatin (PRAVACHOL) 40 MG tablet Take 1 tablet (40 mg total) by mouth daily with evening meal. 90 tablet 1   ??? predniSONE (DELTASONE) 10 MG tablet Take 1 tablet (10 mg total) by mouth daily. (Patient taking differently: Take 10 mg by mouth daily. Now takes 5mg  QOD) 90 tablet 0   ??? sodium chloride 3 % nebulizer solution Inhale 4 mL by nebulization Two (2) times a day. 240 mL 11   ??? budesonide-formoterol (SYMBICORT) 160-4.5 mcg/actuation inhaler Inhale 2 puffs Two (2) times a day. 3 Inhaler 2   ??? EPINEPHrine (EPIPEN 2-PAK) 0.3 mg/0.3 mL injection Inject 0.3 mL (0.3 mg total) into the muscle once for 1 dose. 1 Device 1   ??? hydroCHLOROthiazide (HYDRODIURIL) 25 MG tablet Take 1 tablet (25 mg total) by mouth daily. TAKE ONE (1) TABLET BY MOUTH EVERY DAY 90 tablet 1   ??? insulin lispro (HUMALOG) 100 unit/mL injection pen Inject 6 Units under the skin Three (3) times a day before meals. 5.4 mL 0   ??? insulin lispro (HUMALOG) 100 unit/mL injection Inject 0.01-0.2 mL (1-20 Units total) under the skin Four (4) times a day (before meals and nightly). 30 mL 0     No current facility-administered medications for this visit.        Social history:  Reviewed  and unchanged except as noted in the HPI.    Family History   Problem Relation Age of Onset   ??? Diabetes Father    ??? Diabetes Brother    ??? Diabetes Maternal Aunt    ??? Sarcoidosis Maternal Aunt    ??? Diabetes Maternal Uncle    ??? Diabetes Paternal Aunt    ??? Diabetes Paternal Uncle        ROS: 12 systems reviewed and negative except as per HPI.     PE:  No physical exam as this was a telephone visit.    Labs:    Lab Results   Component Value Date    ALKPHOS 73 08/28/2018    BILITOT 0.4 08/28/2018    BILIDIR <0.10 12/14/2017    PROT 7.8 08/28/2018    ALBUMIN 4.0 08/28/2018    ALT 11 08/28/2018    AST 15 08/28/2018     Lab Results   Component Value Date    WBC 6.5 08/28/2018    HGB 11.6 (L) 08/28/2018    HCT 36.8 08/28/2018    MCV 84.3 08/28/2018    RDW 15.9 (H) 08/28/2018    PLT 304 08/28/2018    NEUTROPCT 55.0 05/22/2018    LYMPHOPCT 22.2 05/22/2018    MONOPCT 12.2 05/22/2018    EOSPCT 6.0 05/22/2018    BASOPCT 0.7 05/22/2018         Drug monitoring:  Posa level 05/22/18: 2,552 --> dose decreased to 200mg  daily  Posa level 08/28/18: 1,636 --> continue 200mg  daily    Lab Results   Component Value Date    ALKPHOS 73 08/28/2018    BILITOT 0.4 08/28/2018    BILIDIR <0.10 12/14/2017    PROT 7.8 08/28/2018    ALBUMIN 4.0 08/28/2018    ALT 11 08/28/2018    AST 15 08/28/2018       Microbiology:  2020:  04/25/18: sputum AFB cx +mycobacterium fortuitum    2019:  02/14/18: OPF, fungal neg, AFB NGTD; galactomannan <0.5, fungitell neg  9/19, 9/29: AFB cx +mycobacterium fortuitum  Mycobacterium fortuitum group       MIC SUSCEPTIBILITY RESULT     Amikacin 2 Susceptible     Cefoxitin 16  Susceptible     Ciprofloxacin 0.5  Susceptible     Clarithromycin >16  Resistant1     Doxycycline 4  Intermediate     Imipenem 8  Intermediate2     Linezolid 2  Susceptible     Minocycline 2  Intermediate     Moxifloxacin 0.25  Susceptible     Tigecycline 0.03  No Interpretation     Trimethoprim + Sulfamethoxazole 0.25  Susceptible      2018:  02/19/17: OPF, fungal neg, AFB neg  7/12 LRCx: Aspergillus [place], 3+ Achromobacter species, 3+ Enterococcus faecium  7/10 Sputum fungal culture: Aspergillus [place]  7/9 LRCx: Aspergillus [place], OP flora, Achromobacter spp.    AFB smear x3 negative; cx NGTD      Imaging:   No recent imaging.    I spent 30 minutes on the phone with the patient. I spent an additional 20 minutes on pre- and post-visit activities.     The patient was physically located in [place] or a state in which I am permitted to provide care. The patient and/or parent/guardian understood that s/he may incur co-pays and cost sharing, and agreed to the telemedicine visit. The visit was reasonable and appropriate under the circumstances given the patient's presentation at the time.  The patient and/or parent/guardian has been advised of the potential risks and limitations of this mode of treatment (including, but not limited to, the absence of in-person examination) and has agreed to be treated using telemedicine. The patient's/patient's family's questions regarding telemedicine have been answered.     If the visit was completed in an ambulatory setting, the patient and/or parent/guardian has also been advised to contact their provider???s office for worsening conditions, and seek emergency medical treatment and/or call 911 if the patient deems either necessary.

## 2018-09-19 NOTE — Unmapped (Signed)
It was good to talk to you today.  I have sent in a new prescription for amoxicillin/clavulanate for you to take 1 tablet twice daily for 7 days. No antibiotics needed after that  Continue the posaconazole at 200mg  daily  Decrease the steroid dose slowly as we discussed:  - 20mg  a day starting tmrw July 3  - 10mg  a day starting July 10  - 5 mg a day starting July 17 and continue at this dose until you see pulmonology.

## 2018-10-08 NOTE — Unmapped (Signed)
[facility] Refill Coordination Note    Specialty Medication(s) to be Shipped:   CF/Pulmonary: -Nucala 100mg /ml    Other medication(s) to be shipped: n/a     Karen Barber, DOB: March 13, 1966  Phone: [phone] (home)       All above HIPAA information was verified with patient.     Completed refill call assessment today to schedule patient's medication shipment from the [facility] [phone]).       Specialty medication(s) and dose(s) confirmed: Regimen is correct and unchanged.   Changes to medications: Karen Barber reports no changes at this time.  Changes to insurance: No  Questions for the pharmacist: No    Confirmed patient received Welcome Packet with first shipment. The patient will receive a drug information handout for each medication shipped and additional FDA Medication Guides as required.       DISEASE/MEDICATION-SPECIFIC INFORMATION        For patients on injectable medications: Patient currently has 0 doses left.  Next injection is scheduled for 7/24 or 7/28, not sure.    SPECIALTY MEDICATION ADHERENCE                Nucala 100 mg/ml: 0 days of medicine on hand         SHIPPING     Shipping address confirmed in Epic.     Delivery Scheduled: Yes, Expected medication delivery date: 10/10/2018.     Medication will be delivered via Same Day Courier to the home address in Epic WAM.    Julianne Rice   [facility] Specialty Pharmacist

## 2018-10-10 MED FILL — NUCALA 100 MG/ML SUBCUTANEOUS AUTO-INJECTOR: 28 days supply | Qty: 1 | Fill #3 | Status: AC

## 2018-10-10 MED FILL — NUCALA 100 MG/ML SUBCUTANEOUS AUTO-INJECTOR: SUBCUTANEOUS | 28 days supply | Qty: 1 | Fill #3

## 2018-10-18 NOTE — Unmapped (Signed)
[facility] calling needs a refill on Hydrochlorothiazise sent to [employer].

## 2018-10-18 NOTE — Unmapped (Signed)
Pharmacy Refill Request    pravastatin    Last OV: 06/25/2018    Last written Rx: 08/16/2018    Last dispensed: n/a      Upcoming appointment: 11/12/2018

## 2018-10-21 NOTE — Unmapped (Signed)
Not a pt @ [facility]

## 2018-10-22 MED ORDER — HYDROCHLOROTHIAZIDE 25 MG TABLET
ORAL_TABLET | Freq: Every day | ORAL | 1 refills | 90.00000 days | Status: CP
Start: 2018-10-22 — End: 2018-11-21

## 2018-10-22 MED ORDER — INSULIN GLARGINE (U-100) 100 UNIT/ML (3 ML) SUBCUTANEOUS PEN
PEN_INJECTOR | 3 refills | 0 days | Status: CP
Start: 2018-10-22 — End: ?

## 2018-10-23 MED ORDER — IPRATROPIUM BROMIDE 0.02 % SOLUTION FOR INHALATION
Freq: Four times a day (QID) | RESPIRATORY_TRACT | 0 refills | 30 days | Status: CP
Start: 2018-10-23 — End: 2018-11-01

## 2018-10-23 MED ORDER — AMOXICILLIN 875 MG-POTASSIUM CLAVULANATE 125 MG TABLET
ORAL_TABLET | Freq: Two times a day (BID) | ORAL | 0 refills | 7 days | Status: CP
Start: 2018-10-23 — End: 2018-10-30

## 2018-10-23 NOTE — Unmapped (Signed)
Pharmacy Refill Request    Ipratropium     Last OV: 06/25/2018    Last written Rx: 06/10/2018    Last dispensed: n/a      Upcoming appointment: 11/12/2018

## 2018-10-24 MED ORDER — PREDNISONE 10 MG TABLET
ORAL_TABLET | ORAL | 0 refills | 40 days | Status: CP
Start: 2018-10-24 — End: 2018-12-03

## 2018-10-24 NOTE — Unmapped (Signed)
Received MyChart message from Ms. Admire about not feeling well. Over the last few weeks has noticed that although her O2 sat is 95% at rest, it will drop into the high 70s with activity. Feeling more SOB with exertion. Denies any fevers and has been living like a hermit to avoid exposure to COVID-19. Feels similar to past flares of sarcoidosis. Continues on Nucala injections for severe asthma and 5mg  of prednisone daily with planned pulm followup in 2-3 weeks.    Will prescribe prednisone burst - 20mg  daily x 5 days, 10mg  daily x 5 days, then back to 5mg  daily until pulm appt. Also prescribed amoxicillin-clavulanate, which she will start if she she doesn't feel better after a couple of days of the increased dose of prednisone. Knows to go to the ED if her O2 does not recover with rest or if dropping even lower with exertion or if she develops new fever.    Duanne Limerick, MD, MHS  Clinical Instructor, [place] Division of Infectious Diseases  Pager: [phone]

## 2018-10-24 NOTE — Unmapped (Signed)
Rx sent 10/22/18

## 2018-10-28 NOTE — Unmapped (Signed)
Refilled short-acting insulin to get patient to PCP visit at the end of the month as she is on an increased dose of steroids

## 2018-10-31 NOTE — Unmapped (Signed)
Assessment and Plan:     Karen Barber was seen today for diabetes.    Diagnoses and all orders for this visit:    Type 2 diabetes mellitus with hyperglycemia, with long-term current use of insulin (CMS-HCC)  HGB A1c 8.2 eight months ago. Will recheck today and contact patient with results.  Continue Humalog with sliding scale dosage.   Resume metformin 1000 mg BID.  Encouraged patient to continue carb controlled diet.  Diabetic foot exam completed today - Left foot sensation 10/10 intact; Right foot sensation 9/10 intact (unable to feel heel).  Albumin/creatinine urine ratio collected in clinic. Will contact patient with results.   -     HM DIABETES FOOT EXAM  -     Albumin/creatinine urine ratio  -     Hemoglobin A1c  -     insulin lispro (HUMALOG) 100 unit/mL injection; Inject 0.01-0.2 mL (1-20 Units total) under the skin Four (4) times a day (before meals and nightly).    Tinea pedis  Recommended OTC antifungal cream. Counseled on keeping foot cool and dry. Contact clinic if symptoms persist or worsen.    Essential hypertension  BP at goal (110/80 in clinic today). Continue HCTZ 25 mg daily. Counseling provided on low sodium diet and regular exercise. Advised patient to continue to monitor and log at-home BP readings.    Other orders  RF atrovent, per patient request.   -     ipratropium (ATROVENT) 0.02 % nebulizer solution; Inhale 2.5 mL (500 mcg total) by nebulization Four (4) times a day.    HPI:      Karen Barber  is here for   Chief Complaint   Patient presents with   ??? Diabetes     Diabetes: Patient presents for follow up of diabetes.  A1C goal is <8.  Diabetes has customarily not been at goal (complicated by: prednisone, not taking metformin).  Current symptoms include: none. Symptoms have been well-controlled. Patient denies hyperglycemia, hypoglycemia , increased appetite, nausea, paresthesia of the feet, polydipsia, polyuria, visual disturbances, vomiting and weight loss. Evaluation to date has included: hemoglobin A1C.  Home sugars: BGs are running high, sometimes in 400s. Current treatment: Restarted insulin which has been somewhat effective. She self-discontinued metformin 9 days ago because she read it could cause dementia. She also notes it causes her to have diarrhea. Patient reports BGs have been elevated since stopping metformin. Doing regular exercise: no.      Patient reports recent sarcoidosis flare approximately 2 weeks ago. Specialist increased prednisone dosage as a result (was previously weaning off prednisone - has been taking for past 4 months). Patient was also started on insulin due to elevated BS associated with prednisone.     Patient presents with sore on right 5th toe for past 2-3 months. She states she applied OTC antibiotic, which initially resolved symptoms, but sore returned after starting prednisone. She has applied OTC antibiotic treatment, but that the affected area now appears dark and wrinkly.    Hypertension: Patient presents for follow-up of hypertension. Blood pressure goal < 140/90.  Hypertension has customarily been at goal.  Home blood pressure readings: did not bring log. Salt intake and diet: salt added to cooking and salt shaker not on table. Associated signs and symptoms: none. Patient denies: blurred vision, chest pain, headache, neck aches, orthopnea, palpitations, paroxysmal nocturnal dyspnea, peripheral edema, pulsating in the ears and tiredness/fatigue. Medication compliance: taking as prescribed. She is not doing regular exercise.      [facility]  Components:     Goals     ??? Get out more often and participate in more activities      Pt wanting to get my energy level back by getting out more and doing more to prevent feeling depressed.            Medication adherence and barriers to the treatment plan have been addressed. Opportunities to optimize healthy behaviors have been discussed. Patient / caregiver voiced understanding.      Past Medical/Surgical History:     Past Medical History:   Diagnosis Date   ??? 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 Breast injury     lung surg on left incision under breast sept 2016   ??? Hepatitis C antibody test positive 2016    repeatedly negative HCV RNA, indicating clearance of infection w/o treatment   ??? Hyperlipidemia    ??? Hypertension    ??? 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 On home O2 2008    per Dr Mal Amabile note from 02/21/2017   ??? Pulmonary aspergillosis (CMS-HCC) 2016    A.fumigatus in 2016, prior to LUL lobectomy. A.niger from sputum 08/2016-09/2016.   ??? 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 Sarcoidosis 1995   ??? Type 2 diabetes mellitus with hyperglycemia (CMS-HCC) 07/27/2014     Past Surgical History:   Procedure Laterality Date   ??? LUNG LOBECTOMY     ??? PR THORACOSCOPY SURG TOT PULM DECORT Left 12/14/2014    Procedure: THORACOSCOPY SURG; W/TOT PULM DECORTIC/PNEUMOLYS;  Surgeon: Evert Kohl, MD;  Location: MAIN OR [facility];  Service: Cardiothoracic   ??? PR THORACOSCOPY W/THERA WEDGE RESEXN INITIAL UNILAT Left 12/03/2014    Procedure: THORACOSCOPY, SURGICAL; WITH THERAPEUTIC WEDGE RESECTION (EG, MASS, NODULE) INITIAL UNILATERAL;  Surgeon: Evert Kohl, MD;  Location: MAIN OR [facility];  Service: Cardiothoracic       Family History:     Family History   Problem Relation Age of Onset   ??? Diabetes Father    ??? Diabetes Brother    ??? Diabetes Maternal Aunt    ??? Sarcoidosis Maternal Aunt    ??? Diabetes Maternal Uncle    ??? Diabetes Paternal Aunt    ??? Diabetes Paternal Uncle        Social History:     Social History     Socioeconomic History   ??? Marital status: Single     Spouse name: None   ??? Number of children: None   ??? Years of education: None   ??? Highest education level: None   Occupational History   ??? None   Social Needs   ??? Financial resource strain: None   ??? Food insecurity     Worry: None Inability: None   ??? Transportation needs     Medical: None     Non-medical: None   Tobacco Use   ??? Smoking status: Former Smoker     Packs/day: 0.50     Years: 9.00     Pack years: 4.50     Types: Cigarettes     Quit date: 05/03/1987     Years since quitting: 31.5   ??? Smokeless tobacco: Never Used   Substance and Sexual Activity   ??? Alcohol use: No     Alcohol/week: 0.0 standard drinks   ??? Drug use: No   ??? Sexual activity: Yes  Lifestyle   ??? Physical activity     Days per week: None     Minutes per session: None   ??? Stress: None   Relationships   ??? Social Wellsite geologist on phone: None     Gets together: None     Attends religious service: None     Active member of club or organization: None     Attends meetings of clubs or organizations: None     Relationship status: None   Other Topics Concern   ??? Do you use sunscreen? No   ??? Tanning bed use? No   ??? Are you easily burned? No   ??? Excessive sun exposure? No   ??? Blistering sunburns? No   Social History Narrative   ??? None       Allergies:     Patient has no known allergies.    Current Medications:     Current Outpatient Medications   Medication Sig Dispense Refill   ??? ACCU-CHEK FASTCLIX LANCET DRUM Misc USE TO CHECK GLUCOSE 4 TIMES DAILY BEFORE MEAL(S) AND NIGHTLY     ??? ACCU-CHEK GUIDE GLUCOSE METER Misc 1 Device by Other route Four (4) times a day (before meals and nightly). AS DIRECTED 1 kit 0   ??? albuterol 2.5 mg /3 mL (0.083 %) nebulizer solution Inhale 3 mL (2.5 mg total) by nebulization every six (6) hours as needed for wheezing or shortness of breath (airway clearance/cough). 120 vial 11   ??? blood sugar diagnostic Strp by Other route Four (4) times a day (before meals and nightly). 400 strip 1   ??? cyclobenzaprine (FLEXERIL) 10 MG tablet Take 1 tablet (10 mg total) by mouth Three (3) times a day as needed. 90 each 2   ??? empty container Misc USE AS DIRECTED 1 each 2   ??? gabapentin (NEURONTIN) 300 MG capsule Take 1 capsule (300 mg total) by mouth Three (3) times a day. 270 capsule 1   ??? hydroCHLOROthiazide (HYDRODIURIL) 25 MG tablet Take 1 tablet (25 mg total) by mouth daily. TAKE ONE (1) TABLET BY MOUTH EVERY DAY 90 tablet 1   ??? insulin glargine (BASAGLAR, LANTUS) 100 unit/mL (3 mL) injection pen 18 units 30 pen 3   ??? MEDICAL SUPPLY ITEM Accucheck Glide Glocumeter for home glucose checks. Check blood glucose ACHS. 1 Package 0   ??? mepolizumab 100 mg/mL AtIn Inject 100 mg under the skin every twenty-eight (28) days. 1 mL 12   ??? metFORMIN (GLUCOPHAGE) 1000 MG tablet Take 1 tablet (1,000 mg total) by mouth 2 (two) times a day with meals. 180 tablet 1   ??? miscellaneous medical supply Misc Nebulizer machine and tubing. Patients machine is 55 years old. 1 each 0   ??? OXYGEN-AIR DELIVERY SYSTEMS MISC Dose: 2-3 LPM     ??? posaconazole (NOXAFIL) 100 mg TbEC delayed released tablet Take 200 mg by mouth daily. 60 tablet 2   ??? pravastatin (PRAVACHOL) 40 MG tablet Take 1 tablet (40 mg total) by mouth daily with evening meal. 90 tablet 1   ??? pravastatin (PRAVACHOL) 40 MG tablet Take 1 tablet (40 mg total) by mouth daily with evening meal. 90 tablet 1   ??? predniSONE (DELTASONE) 10 MG tablet Take 2 tablets (20 mg total) by mouth daily for 5 days, THEN 1 tablet (10 mg total) daily for 5 days, THEN 0.5 tablets (5 mg total) daily. 30 tablet 0   ??? albuterol HFA 90 mcg/actuation inhaler Inhale 2  puffs every four (4) hours as needed for wheezing or shortness of breath. 2 Inhaler 11   ??? budesonide-formoterol (SYMBICORT) 160-4.5 mcg/actuation inhaler Inhale 2 puffs Two (2) times a day. 3 Inhaler 2   ??? EPINEPHrine (EPIPEN 2-PAK) 0.3 mg/0.3 mL injection Inject 0.3 mL (0.3 mg total) into the muscle once for 1 dose. 1 Device 1   ??? insulin lispro (HUMALOG) 100 unit/mL injection pen Inject 6 Units under the skin Three (3) times a day before meals. 5.4 mL 0   ??? insulin lispro (HUMALOG) 100 unit/mL injection Inject 0.01-0.2 mL (1-20 Units total) under the skin Four (4) times a day (before meals and nightly). 30 mL 6   ??? ipratropium (ATROVENT) 0.02 % nebulizer solution Inhale 2.5 mL (500 mcg total) by nebulization Four (4) times a day. 300 mL 3     No current facility-administered medications for this visit.        Health Maintenance:     Health Maintenance Summary w/Most Recent Date       Status Date      HPV Cotest with Pap Smear (21-65) Overdue 06/15/1984     Pap Smear with Cotest HPV (21-65) Overdue 02/27/2001      Done 02/28/1996 Registry Metric: Pap Smear date     Done 02/28/1996 PAP SMEAR (HISTORICAL RESULT)    Zoster Vaccines Overdue 06/15/2013     Retinal Eye Exam Overdue 04/12/2017      Done 04/12/2016 HM DIABETES EYE EXAM HM Diabetic Eye Exam           Done 06/18/2014 SmartData: OPHTH FUNDUS OD PERIPHERY     Done 06/18/2014 SmartData: OPHTH FUNDUS OS PERIPHERY     Done 06/18/2014 SmartData: FINDINGS - PE - EYES - FUNDUSCOPIC - PERIPHERY - LEFT PERIPHERY NORMAL    DTaP/Tdap/Td Vaccines Overdue 07/07/2017      Done 07/08/2007 Imm Admin: TdaP    FOBT/FIT Overdue 07/18/2017      Done 07/18/2016 POCT OCCULT BLOOD, STOOL 1-3    Hemoglobin A1c Overdue 06/04/2018      Done 03/05/2018 Registry Metric: Last Hemoglobin A1c Date     Done 03/05/2018 HEMOGLOBIN A1C Hemoglobin A1C           Done 11/05/2017 HEMOGLOBIN A1C Hemoglobin A1C           Done 07/03/2017 POCT GLYCOSYLATED HEMOGLOBIN (HGB A1C) HGB A1C, RAP/PDS           Done 01/31/2017 HEMOGLOBIN A1C Hemoglobin A1C           Patient has more history with this topic...    Foot Exam Next Due 11/06/2018      Done 11/05/2017 HM DIABETES FOOT EXAM     Done 06/14/2016 HM DIABETES FOOT EXAM     Done 11/05/2012 HM DIABETES FOOT EXAM    Urine Albumin/Creatinine Ratio Next Due 11/06/2018      Done 11/05/2017 Registry Metric: [facility] DM AMB LAST URINE MICROALBUMIN TO CREATININE RATIO     Done 11/05/2017 ALBUMIN / CREATININE URINE RATIO Albumin/Creatinine Ratio           Done 06/14/2016 ALBUMIN / CREATININE URINE RATIO Albumin/Creatinine Ratio          Influenza Vaccine Next Due 11/19/2018 Done 12/06/2017 Imm Admin: Influenza Vaccine Quad (IIV4 PF) 76mo+ injectable     Done 01/03/2017 Imm Admin: Influenza Vaccine Quad (IIV4 PF) 2mo+ injectable     Done 12/16/2015 Imm Admin: Influenza Vaccine Quad (IIV4 PF) 80mo+ injectable     Done 12/19/2014 Imm  Admin: Influenza Vaccine Quad (IIV4 PF) 18mo+ injectable     Done 11/28/2012 Imm Admin: Influenza Vaccine Quad (IIV4 PF) 63mo+ injectable     Patient has more history with this topic...    Serum Creatinine Monitoring Next Due 08/28/2019      Done 08/28/2018 Registry Metric: Serum creatinine     Done 08/02/2016 Ext Proc: CHG BASIC METABOLIC PANEL CALCIUM TOTAL     Done 08/28/2018 COMPREHENSIVE METABOLIC PANEL Creatinine           Done 05/22/2018 COMPREHENSIVE METABOLIC PANEL Creatinine           Done 04/25/2018 COMPREHENSIVE METABOLIC PANEL Creatinine           Patient has more history with this topic...    Potassium Monitoring Next Due 08/28/2019      Done 08/28/2018 Registry Metric: Potassium     Done 08/02/2016 Ext Proc: CHG BASIC METABOLIC PANEL CALCIUM TOTAL     Done 08/28/2018 COMPREHENSIVE METABOLIC PANEL Potassium           Done 05/22/2018 COMPREHENSIVE METABOLIC PANEL Potassium           Done 04/25/2018 COMPREHENSIVE METABOLIC PANEL Potassium           Patient has more history with this topic...    Mammogram Start Age 40 Next Due 02/29/2020      Done 02/28/2018 MAMMO SCREENING BILATERAL     Done 03/16/2015 MAMMO SCREENING BILATERAL    COPD Spirometry Next Due 01/04/2022      Done 01/04/2017 FLOW VOLUME LOOP     Done 12/16/2015 SPIROMETRY PRE / POST     Done 11/20/2014 FLOW VOLUME LOOP     Done 06/25/2013 SPIROMETRY    Hepatitis C Screen This plan is no longer active.      Done 11/24/2014 HEPATITIS C ANTIBODY Hepatitis C Ab           Done 11/18/2014 HEPATITIS C RNA, QUANTITATIVE, PCR     Done 11/17/2014 HEPATITIS C ANTIBODY Hepatitis C Ab           Done 11/02/2014 HEPATITIS C RNA, QUANTITATIVE, PCR     Done 10/28/2014 HEPATITIS C ANTIBODY Hepatitis C Ab          Pneumococcal Vaccine This plan is no longer active.      Done 01/03/2017 Imm Admin: PNEUMOCOCCAL POLYSACCHARIDE 23     Done 07/08/2007 Imm Admin: PNEUMOCOCCAL POLYSACCHARIDE 23          Immunizations:     Immunization History   Administered Date(s) Administered   ??? INFLUENZA TIV (TRI) PF (IM) 01/02/2005, 12/14/2008   ??? Influenza Vaccine Quad (IIV4 PF) 31mo+ injectable 11/28/2012, 12/19/2014, 12/16/2015, 01/03/2017, 12/06/2017   ??? PNEUMOCOCCAL POLYSACCHARIDE 23 07/08/2007, 01/03/2017   ??? PPD Test 01/19/2015   ??? TdaP 07/08/2007       I have reviewed and (if needed) updated the patient's problem list, medications, allergies, past medical and surgical history, social and family history.    ROS:      ROS  Comprehensive 10 point ROS negative unless otherwise stated in the HPI.       Vital Signs:     Wt Readings from Last 3 Encounters:   11/01/18 99.8 kg (220 lb)   06/25/18 90.7 kg (200 lb)   04/25/18 92.8 kg (204 lb 9.6 oz)     Temp Readings from Last 3 Encounters:   11/01/18 36.1 ??C (97 ??F) (Temporal)   06/25/18 36.7 ??C (98.1 ??F) (Oral)   04/25/18 36.9 ??  C (98.4 ??F)     BP Readings from Last 3 Encounters:   11/01/18 110/80   06/25/18 152/83   04/25/18 132/69     Pulse Readings from Last 3 Encounters:   11/01/18 93   06/25/18 94   04/25/18 89     Estimated body mass index is 30.7 kg/m?? as calculated from the following:    Height as of 04/05/18: 180.3 cm (5' 10.98).    Weight as of this encounter: 99.8 kg (220 lb).  Facility age limit for growth percentiles is 20 years.    Objective:      General: Alert and oriented x3. Well-appearing. No acute distress.   HEENT:  Normocephalic.  Atraumatic. Conjunctiva and sclera normal. OP MMM without lesions.   Neck:  Supple. No thyroid enlargement. No adenopathy.   Heart:  Regular rate and rhythm . Normal S1, S2.  No murmurs, rubs or gallops.   Lungs:  No respiratory distress.  Lungs clear to auscultation. No wheezes, rhonchi, or rales. Wearing portable oxygen.   GI/GU:  Soft, +BS, nondistended, non-TTP. No palpable masses or organomegaly.   MSK: Gait and station unremarkable. Normal ROM major joints. Normal strength and tone of proximal muscles.  Extremities:  No edema. Peripheral pulses normal.   Skin:  Warm, dry. No rash. Closed ulcerated lesion between tight 4th and 5th toes with hyperpigmentation and peeling.   Neuro:  Non-focal. No obvious weakness.   Psych:  Affect normal, eye contact good, speech clear and coherent.  Foot Exam:  No pedal edema. Feet with normal appearance. Monofilament testing: left foot with 10/10 sensation; right foot with 9/10 sensation (could not feel heel)     I attest that I, Bayard Hugger, personally documented this note while acting as scribe for Noralyn Pick, FNP.      Bayard Hugger, Scribe.  11/01/2018     The documentation recorded by the scribe accurately reflects the service I personally performed and the decisions made by me.    Noralyn Pick, FNP

## 2018-11-01 ENCOUNTER — Ambulatory Visit: Admit: 2018-11-01 | Discharge: 2018-11-02 | Payer: MEDICARE | Attending: Family | Primary: Family

## 2018-11-01 DIAGNOSIS — B353 Tinea pedis: Secondary | ICD-10-CM

## 2018-11-01 DIAGNOSIS — E1165 Type 2 diabetes mellitus with hyperglycemia: Principal | ICD-10-CM

## 2018-11-01 DIAGNOSIS — I10 Essential (primary) hypertension: Secondary | ICD-10-CM

## 2018-11-01 DIAGNOSIS — Z794 Long term (current) use of insulin: Secondary | ICD-10-CM

## 2018-11-01 MED ORDER — IPRATROPIUM BROMIDE 0.02 % SOLUTION FOR INHALATION: 500 ug | mL | Freq: Four times a day (QID) | 3 refills | 30 days | Status: AC

## 2018-11-01 MED ORDER — IPRATROPIUM BROMIDE 0.02 % SOLUTION FOR INHALATION
Freq: Four times a day (QID) | RESPIRATORY_TRACT | 3 refills | 30.00000 days | Status: CP
Start: 2018-11-01 — End: 2018-11-01

## 2018-11-01 MED ORDER — INSULIN LISPRO (U-100) 100 UNIT/ML SUBCUTANEOUS SOLUTION
Freq: Four times a day (QID) | SUBCUTANEOUS | 6 refills | 38.00000 days | Status: CP
Start: 2018-11-01 — End: 2018-12-01

## 2018-11-02 LAB — ESTIMATED AVERAGE GLUCOSE: Estimated average glucose:MCnc:Pt:Bld:Qn:Estimated from glycated hemoglobin: 235

## 2018-11-02 LAB — CREATININE, URINE: Lab: 125

## 2018-11-02 LAB — HEMOGLOBIN A1C: HEMOGLOBIN A1C: 9.8 % — ABNORMAL HIGH (ref 4.8–5.6)

## 2018-11-02 LAB — ALBUMIN / CREATININE URINE RATIO: ALBUMIN/CREATININE RATIO: 79.2 ug/mg — ABNORMAL HIGH (ref 0.0–30.0)

## 2018-11-04 NOTE — Unmapped (Signed)
HgA1c is up to 9. 8 fro 8.2

## 2018-11-04 NOTE — Unmapped (Signed)
[facility] Refill Coordination Note    Specialty Medication(s) to be Shipped:   CF/Pulmonary: -Nucala 100mg /ml    Other medication(s) to be shipped: n/a     Deniece Ree, DOB: Apr 21, 1965  Phone: [phone] (home) [phone] (work)      All above HIPAA information was verified with patient.     Completed refill call assessment today to schedule patient's medication shipment from the [facility] [phone]).       Specialty medication(s) and dose(s) confirmed: Regimen is correct and unchanged.   Changes to medications: [name] reports no changes at this time.  Changes to insurance: No  Questions for the pharmacist: No    Confirmed patient received Welcome Packet with first shipment. The patient will receive a drug information handout for each medication shipped and additional FDA Medication Guides as required.       DISEASE/MEDICATION-SPECIFIC INFORMATION        For patients on injectable medications: Patient currently has 0 doses left.  Next injection is scheduled for 8/28.    SPECIALTY MEDICATION ADHERENCE     Medication Adherence    Patient reported X missed doses in the last month: 0  Specialty Medication: Nucala 100mg /ml  Informant: patient                Nucala 100 mg/ml: 0 days of medicine on hand       SHIPPING     Shipping address confirmed in Epic.     Delivery Scheduled: Yes, Expected medication delivery date: 11/11/2018.     Medication will be delivered via Same Day Courier to the home address in Epic WAM.    Julianne Rice   [facility] Specialty Pharmacist

## 2018-11-08 NOTE — Unmapped (Deleted)
RETURN PULMONARY CLINIC VISIT:    Patient: Karen Barber(10/18/1965)  Reason for visit: Hospital discharge follow up; pulmonary sarcoidosis    HISTORY OF PRESENT ILLNESS:     Karen Barber is a pleasant 53 y.o. African-American woman with history of sarcoidosis, it was diagnosed by lip biopsy in 1995. She has been on 2 lpm [place] continuous oxygen for over 10 years now.  She also has a history of Aspergillus pneumonia and she was treated with voriconazole and left upper lobectromy in 11/2014.  She is currently not on any immunomodulating agents.      Recently admitted to [facility] for acute on chronic hypoxic respiratory failure and increasing dyspnea. Discharged on 02/18/18. Patient needed up to 5 LPM with exertion and 3 LPM at rest from her usual baseline of 2-3 LPM. CTA at admission showed increasing lymphadenopathy, but no PE or increasing parenchymal changes. Echo showed normal RV and LV function. She was treated with Augmentin, airway clearance and eventually the addition of Prednisone 60 mg daily, tapered to 40 mg daily at discharge. ID was consulted given her history of chronic invasive aspergillosis and recent NTM AFB growth from sputum of M. [employer]. Repeat AFB cultures from hospitalization have shown no growth to date. She has since seen her primary ID physician who changed her from Voriconazole to Posaconazole. At her last visit with me in 02/2018, she was referred for consideration of lung transplant, however, was deemed not a candidate due to anatomic abnormalities that would not allow the surgery to be performed. Follow up PFTs were also ordered to monitor with her Prednisone taper, however, these were never completed.    She reports increasing shortness of breath when weaning off of her prednisone.       REVIEW OF SYSTEMS: A comprehensive ROS was performed was reviewed and found to be negative except as above in the HPI.    INTERVAL HPI 11/07/18:  Patient was not feeling well and more short of breath over the last few weeks with episodes of desaturation into the high 70s with activity. These symptoms were similar to previous flares of sarcoidosis. She was treated with prednisone burst (20mg  daily x 5 days, 10mg  daily x 5 days, then back to 5mg  daily). Also treated with a course of amoxicillin-clavulanate. Currently, she is on Nucala injections for severe asthma and 5mg  of prednisone daily.    She denies any fevers, chills, lower extremity swelling or edema, orthopnea, or PND.  She denies hemoptysis.  She continues to remain adherent to her prescribed medications of Symbicort and hypertonic saline 3%.  She continues to use duo nebs as needed although has not noticed any benefit in her shortness of breath or cough using this currently.  She states she felt her best when she was on prednisone.  She has gained approximately 15 pounds since December at her last appointment but overall remains down from her highest weight of 264 pounds.      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  She is a lifelong nonsmoker.  MEDICATIONS AND ALLERGIES:     CURRENT MEDICATIONS:  Outpatient Encounter Medications as of 11/08/2018   Medication Sig Dispense Refill   ??? ACCU-CHEK FASTCLIX LANCET DRUM Misc USE TO CHECK GLUCOSE 4 TIMES DAILY BEFORE MEAL(S) AND NIGHTLY     ??? ACCU-CHEK GUIDE GLUCOSE METER Misc 1 Device by Other route Four (4) times a day (before meals and nightly). AS DIRECTED 1 kit 0   ??? albuterol 2.5 mg /3 mL (0.083 %) nebulizer solution Inhale 3 mL (2.5 mg total) by nebulization every six (6) hours as needed for wheezing or shortness of breath (airway clearance/cough). 120 vial 11   ??? albuterol HFA 90 mcg/actuation inhaler Inhale 2 puffs every four (4) hours as needed for wheezing or shortness of breath. 2 Inhaler 11   ??? [EXPIRED] amoxicillin-clavulanate (AUGMENTIN) 875-125 mg per tablet Take 1 tablet by mouth Two (2) times a day for 7 days. 14 tablet 0   ??? blood sugar diagnostic Strp by Other route Four (4) times a day (before meals and nightly). 400 strip 1   ??? budesonide-formoterol (SYMBICORT) 160-4.5 mcg/actuation inhaler Inhale 2 puffs Two (2) times a day. 3 Inhaler 2   ??? cyclobenzaprine (FLEXERIL) 10 MG tablet Take 1 tablet (10 mg total) by mouth Three (3) times a day as needed. 90 each 2   ??? empty container Misc USE AS DIRECTED 1 each 2   ??? EPINEPHrine (EPIPEN 2-PAK) 0.3 mg/0.3 mL injection Inject 0.3 mL (0.3 mg total) into the muscle once for 1 dose. 1 Device 1   ??? gabapentin (NEURONTIN) 300 MG capsule Take 1 capsule (300 mg total) by mouth Three (3) times a day. 270 capsule 1   ??? hydroCHLOROthiazide (HYDRODIURIL) 25 MG tablet Take 1 tablet (25 mg total) by mouth daily. TAKE ONE (1) TABLET BY MOUTH EVERY DAY 90 tablet 1   ??? insulin glargine (BASAGLAR, LANTUS) 100 unit/mL (3 mL) injection pen 18 units 30 pen 3   ??? insulin lispro (HUMALOG) 100 unit/mL injection pen Inject 6 Units under the skin Three (3) times a day before meals. 5.4 mL 0   ??? insulin lispro (HUMALOG) 100 unit/mL injection Inject 0.01-0.2 mL (1-20 Units total) under the skin Four (4) times a day (before meals and nightly). 30 mL 6   ??? ipratropium (ATROVENT) 0.02 % nebulizer solution Inhale 2.5 mL (500 mcg total) by nebulization Four (4) times a day. 300 mL 3   ??? MEDICAL SUPPLY ITEM Accucheck Glide Glocumeter for home glucose checks. Check blood glucose ACHS. 1 Package 0   ??? mepolizumab 100 mg/mL AtIn Inject 100 mg under the skin every twenty-eight (28) days. 1 mL 12   ??? metFORMIN (GLUCOPHAGE) 1000 MG tablet Take 1 tablet (1,000 mg total) by mouth 2 (two) times a day with meals. 180 tablet 1   ??? miscellaneous medical supply Misc Nebulizer machine and tubing. Patients machine is 55 years old. 1 each 0   ??? OXYGEN-AIR DELIVERY SYSTEMS MISC Dose: 2-3 LPM     ??? posaconazole (NOXAFIL) 100 mg TbEC delayed released tablet Take 200 mg by mouth daily. 60 tablet 2   ??? pravastatin (PRAVACHOL) 40 MG tablet Take 1 tablet (40 mg total) by mouth daily with evening meal. 90 tablet 1   ??? pravastatin (PRAVACHOL) 40 MG tablet Take 1 tablet (40 mg total) by mouth daily with evening meal. 90 tablet 1   ??? predniSONE (DELTASONE) 10 MG tablet Take 2 tablets (20 mg total) by mouth daily for 5 days, THEN  1 tablet (10 mg total) daily for 5 days, THEN 0.5 tablets (5 mg total) daily. 30 tablet 0   ??? [EXPIRED] sodium chloride 3 % nebulizer solution Inhale 4 mL by nebulization Two (2) times a day. 240 mL 11     No facility-administered encounter medications on file as of 11/08/2018.        ALLERGIES:  Allergies as of 11/08/2018   ??? (No Known Allergies)       PHYSICAL EXAM:     There were no vitals filed for this visit. There is no height or weight on file to calculate BMI.  GENERAL: cooperative, no acute distress, appears younger than stated age, congested voice  EYES: anicteric, noninjected, EOMI  HEENT: Neck supple, trachea midline, moist mucus membranes, [place] in place  CV: RRR, no murmurs/rubs/gallops, no JVD.   PULM: Diminished breath sounds throughout, worse on left. No wheezing. Normal excursion. Normal work of breathing.   ABD: Soft, nontender, nondistended. Normoactive bowel sounds.  EXTREMITIES: Mild finger clubbin. No edema.  SKIN: No rashes, lesions, or skin breakdown. Warm and well perfused.  NEURO: No focal neurologic deficits. Moves all extremities and follows commands.   PSYCH: Well groomed, appropriate mood and affect, good eye contact.      Wt Readings from Last 6 Encounters:   11/01/18 99.8 kg (220 lb)   06/25/18 90.7 kg (200 lb)   04/25/18 92.8 kg (204 lb 9.6 oz)   04/05/18 93.6 kg (206 lb 6.4 oz)   02/27/18 84.8 kg (187 lb)   02/14/18 87 kg (191 lb 12.8 oz)     ANCILLARY DATA:     All imaging and labs were reviewed personally.      Echo 02/14/18:  ?? Left ventricular hypertrophy - mild  ?? Normal left ventricular systolic function, ejection fraction > 55%  ?? Aortic sclerosis  ?? Dilated ascending aorta  ?? Normal right ventricular systolic function      CTA PE Protocol 02/13/18:  IMPRESSION:  No pulmonary embolism or acute airspace disease.    Since prior study dated 09/26/2016, there has been progressive mediastinal lymphadenopathy, and pulmonary fibrosis, related to end-stage sarcoidosis.   ??  Dilated main pulmonary artery  as can be seen with pulmonary hypertension.      EKG 02/13/18  SINUS TACHYCARDIA WITH 1ST DEGREE AV BLOCK  PULMONARY DISEASE PATTERN  LEFT ANTERIOR FASCICULAR BLOCK  NONSPECIFIC T WAVE ABNORMALITY  ABNORMAL ECG  WHEN COMPARED WITH ECG OF 14-Dec-2017 13:05,  PREMATURE VENTRICULAR BEATS ARE NO LONGER PRESENT  QRS AXIS SHIFTED LEFT  Confirmed by Warnell Forester ([id number]) on 02/13/2018 10:03:18 PM    LFTs  Lab Results   Component Value Date    ALKPHOS 73 08/28/2018    BILITOT 0.4 08/28/2018    BILIDIR <0.10 12/14/2017    PROT 7.8 08/28/2018    ALBUMIN 4.0 08/28/2018    ALT 11 08/28/2018    AST 15 08/28/2018      Calcium   Date Value Ref Range Status   08/28/2018 9.7 8.5 - 10.2 mg/dL Final           ASSESSMENT AND PLAN:       Karen Barber is a pleasant 53 y.o. African-American woman with history of sarcoidosis with obstructive and restrictive lung disease and aspergillus s/p LUL lobectomy with recurrence in 08/2016, now on chronic voriconazole who is presenting today for follow-up management of the same. Off Prednisone since 05/2015. But recently placed back on Prednisone during hospitalization from 02/13/18-02/18/18.  With recent hospitalization at [facility] from 02/13/18-02/18/18 for dyspnea and worsening hypoxia. Symptoms improved with Augmentin, Airway Clearance and Prednisone. CTA negative for PE but increased lymphadenopathy and potentially worsening parenchymal disease from prior although interpretation is limited by artifact and PE protocol study. Echo with normal cardiac function and no evidence of elevated PASP or right heart disease.  Having increasing symptoms of dyspnea on exertion as well as cough and right-sided pleuritic type pain since coming off of her prednisone in January.  Etiology of her symptoms are unclear currently include represent recurrence of her pulmonary sarcoidosis given prior CT findings, infectious giving her chronic scarring and bronchiectasis from damage as a result of her sarcoid, or possibly related to her underlying reactive airway disease.  Pulmonary hypertension is also a consideration with a potential pulmonary artery stenosis from prior PA plasty during her left upper lobectomy in 2016, although no evidence of this on prior echocardiogram. Tried HTS 7% nebs today with improved sputum production in clinic today.    NTM AFB from prior sputum (M. [employer]): Has grown twice previously from expectorated sputum. Unlikely to represent pathogen currently given she has largely been asymptomatic and current symptoms improved with Prednisone. Repeat AFB from recently hospitalization remain NGTD although are not finalized currently.      Sarcoidosis:  -- Whole body PET scan to evaluate for thoracic sarcoidosis activity; may consider CTA to evaluate for pulmonary vascular stenosis/compression by mediastinal/hilar LNs which can be amenable to stenting  -- Continue supplemental oxygen at 3 L.  We will work to obtain portable oxygen concentrator versus portable noninvasive oxygen supplementation and positive airway pressure.  -- Continue pulmonary rehab  -- CBC w diff, Ca, vitamin D (25 hydroxy vitamin D and 1,25 hydroxy vitamin D), LFTs, viral hepatitis serology  -- Annual spirometry, DLCO (if FVC/DLCO >1.5, will get echo to evaluate for pulmonary hypertension)  -- Annual ophthalmology and EKG (if abnormal EKG or cardiac symptoms, will proceed to cardiac MRI)  -- She has been referred for evaluation of pulmonary transplant in the past however is not a candidate due to prior PA plasty which precludes her ability to undergo the surgery for lung transplantation her thoracic surgery      Bronchiectasis:  -- Continue use of airway clearance device, aiming for twice daily and can pre-treat with Albuterol to improve clearance  -- Continue  albuterol, ipratropium, and hypertonic saline 7% twice daily for airway clearance    Chronic Invasive Aspergillosis: s/p LUL lobectomy. Recurrence in 08/2016.  -- Currently on posaconazole (changed in January 2020 from voriconazole per ID recommendations).   -- Continue to follow with ID.  -- Fungal sputum culture, lower respiratory culture and AFB today  -- If PET scan suggests active disease, will discuss with ID immunosuppressive therapy in the setting of invasive aspergillosis    Asthma:  -- Continue Nucala and Symbicort inh    NTM AFB from prior sputum (M. [employer]), likely represents colonization/contamination. Has not grown on most recent AFB cultures. No indication for treatment currently.  --Repeat AFB sputum culture every visit    OSA symptoms:  -- Sleep study     Health maintenance:  -- Vaccines: up-to-date/will get through PCP  -- Smoking cessation: discussed at length/N/A  -- Lung cancer screening: N/A      RTC as previously scheduled in 3 months     Kena Limon O. Wellington Hampshire, MD   Assistant Professor of Medicine  Pulmonary and Critical Care Medicine  [facility] Pulmonary Hypertension [facility]

## 2018-11-11 MED FILL — NUCALA 100 MG/ML SUBCUTANEOUS AUTO-INJECTOR: SUBCUTANEOUS | 28 days supply | Qty: 1 | Fill #4

## 2018-11-11 MED FILL — NUCALA 100 MG/ML SUBCUTANEOUS AUTO-INJECTOR: 28 days supply | Qty: 1 | Fill #4 | Status: AC

## 2018-11-18 NOTE — Unmapped (Addendum)
RETURN PULMONARY CLINIC VISIT:    Patient: Karen Barber(1965/04/16)  Reason for visit: Hospital discharge follow up; pulmonary sarcoidosis    HISTORY OF PRESENT ILLNESS:     Karen Barber is a pleasant 53 y.o. African-American woman with history of sarcoidosis, it was diagnosed by lip biopsy in 1995. She has been on 2 lpm [place] continuous oxygen for over 10 years now.  She also has a history of Aspergillus pneumonia and she was treated with voriconazole and left upper lobectromy in 11/2014.  She is currently not on any immunomodulating agents.      Recently admitted to [facility] for acute on chronic hypoxic respiratory failure and increasing dyspnea. Discharged on 02/18/18. Patient needed up to 5 LPM with exertion and 3 LPM at rest from her usual baseline of 2-3 LPM. CTA at admission showed increasing lymphadenopathy, but no PE or increasing parenchymal changes. Echo showed normal RV and LV function. She was treated with Augmentin, airway clearance and eventually the addition of Prednisone 60 mg daily, tapered to 40 mg daily at discharge. ID was consulted given her history of chronic invasive aspergillosis and recent NTM AFB growth from sputum of M. Fortuitum. Repeat AFB cultures from hospitalization have shown no growth to date. She has since seen her primary ID physician who changed her from Voriconazole to Posaconazole. At her last visit with me in 02/2018, she was referred for consideration of lung transplant, however, was deemed not a candidate due to anatomic abnormalities that would not allow the surgery to be performed. Follow up PFTs were also ordered to monitor with her Prednisone taper, however, these were never completed.    She reports increasing shortness of breath when weaning off of her prednisone.       REVIEW OF SYSTEMS: A comprehensive ROS was performed was reviewed and found to be negative except as above in the HPI.    INTERVAL HPI 11/19/18:  Patient was not feeling well and more short of breath over the last few weeks with episodes of desaturation into the high 70s with activity. These symptoms were similar to previous flares of sarcoidosis. She was treated with prednisone burst (20mg  daily x 5 days, 10mg  daily x 5 days, and now is back to 5mg  daily). Also treated with a course of amoxicillin-clavulanate. Currently, she is on Symbicort (with spacer) and Nucala injections for severe asthma (although never felt a difference) and 5mg  of prednisone daily for sarcoidosis.    She denies any fevers, chills, lower extremity swelling or edema, orthopnea, or PND.  Also denies hemoptysis.  .  She reports feeling her best when on prednisone.  Also notes that Augmentin made her breathing significantly better. No palpitation or eye symptoms (was seen by ophthalmology yesterday). She is currently on 3-4 L O2. 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  She is a lifelong nonsmoker.    MEDICATIONS AND ALLERGIES:     CURRENT MEDICATIONS:  Outpatient Encounter Medications as of 11/19/2018  Medication Sig Dispense Refill   ??? ACCU-CHEK FASTCLIX LANCET DRUM Misc USE TO CHECK GLUCOSE 4 TIMES DAILY BEFORE MEAL(S) AND NIGHTLY     ??? ACCU-CHEK GUIDE GLUCOSE METER Misc 1 Device by Other route Four (4) times a day (before meals and nightly). AS DIRECTED 1 kit 0   ??? albuterol 2.5 mg /3 mL (0.083 %) nebulizer solution Inhale 3 mL (2.5 mg total) by nebulization every six (6) hours as needed for wheezing or shortness of breath (airway clearance/cough). 120 vial 11   ??? albuterol HFA 90 mcg/actuation inhaler Inhale 2 puffs every four (4) hours as needed for wheezing or shortness of breath. 2 Inhaler 11   ??? blood sugar diagnostic Strp by Other route Four (4) times a day (before meals and nightly). 400 strip 1   ??? budesonide-formoterol (SYMBICORT) 160-4.5 mcg/actuation inhaler Inhale 2 puffs Two (2) times a day. 3 Inhaler 2   ??? cyclobenzaprine (FLEXERIL) 10 MG tablet Take 1 tablet (10 mg total) by mouth Three (3) times a day as needed. 90 each 2   ??? empty container Misc USE AS DIRECTED 1 each 2   ??? gabapentin (NEURONTIN) 300 MG capsule Take 1 capsule (300 mg total) by mouth Three (3) times a day. 270 capsule 1   ??? hydroCHLOROthiazide (HYDRODIURIL) 25 MG tablet Take 1 tablet (25 mg total) by mouth daily. TAKE ONE (1) TABLET BY MOUTH EVERY DAY 90 tablet 1   ??? insulin glargine (BASAGLAR, LANTUS) 100 unit/mL (3 mL) injection pen 18 units 30 pen 3   ??? ipratropium (ATROVENT) 0.02 % nebulizer solution Inhale 2.5 mL (500 mcg total) by nebulization Four (4) times a day. 300 mL 3   ??? MEDICAL SUPPLY ITEM Accucheck Glide Glocumeter for home glucose checks. Check blood glucose ACHS. 1 Package 0   ??? mepolizumab 100 mg/mL AtIn Inject 100 mg under the skin every twenty-eight (28) days. 1 mL 12   ??? metFORMIN (GLUCOPHAGE) 1000 MG tablet Take 1 tablet (1,000 mg total) by mouth 2 (two) times a day with meals. 180 tablet 1   ??? miscellaneous medical supply Misc Nebulizer machine and tubing. Patients machine is 55 years old. 1 each 0   ??? OXYGEN-AIR DELIVERY SYSTEMS MISC Dose: 2-3 LPM     ??? posaconazole (NOXAFIL) 100 mg TbEC delayed released tablet Take 200 mg by mouth daily. 60 tablet 2   ??? pravastatin (PRAVACHOL) 40 MG tablet Take 1 tablet (40 mg total) by mouth daily with evening meal. 90 tablet 1   ??? predniSONE (DELTASONE) 10 MG tablet Take 2 tablets (20 mg total) by mouth daily for 5 days, THEN 1 tablet (10 mg total) daily for 5 days, THEN 0.5 tablets (5 mg total) daily. 30 tablet 0   ??? [EXPIRED] amoxicillin-clavulanate (AUGMENTIN) 875-125 mg per tablet Take 1 tablet by mouth Two (2) times a day for 7 days. 14 tablet 0   ??? EPINEPHrine (EPIPEN 2-PAK) 0.3 mg/0.3 mL injection Inject 0.3 mL (0.3 mg total) into the muscle once for 1 dose. 1 Device 1   ??? insulin lispro (HUMALOG) 100 unit/mL injection pen Inject 6 Units under the skin Three (3) times a day before meals. 5.4 mL 0   ??? insulin lispro (HUMALOG) 100 unit/mL injection Inject 0.01-0.2 mL (1-20 Units total) under the skin Four (4) times a day (before meals and nightly). (Patient not taking: Reported on 11/18/2018) 30 mL 6   ??? [DISCONTINUED] pravastatin (PRAVACHOL) 40 MG tablet Take 1 tablet (40 mg total) by  mouth daily with evening meal. 90 tablet 1     No facility-administered encounter medications on file as of 11/19/2018.        ALLERGIES:  Allergies as of 11/19/2018   ??? (No Known Allergies)       PHYSICAL EXAM:     N/A    Wt Readings from Last 6 Encounters:   11/18/18 95.3 kg (210 lb)   11/01/18 99.8 kg (220 lb)   06/25/18 90.7 kg (200 lb)   04/25/18 92.8 kg (204 lb 9.6 oz)   04/05/18 93.6 kg (206 lb 6.4 oz)   02/27/18 84.8 kg (187 lb)     ANCILLARY DATA:     All imaging and labs were reviewed personally.      Echo 02/14/18:  ?? Left ventricular hypertrophy - mild  ?? Normal left ventricular systolic function, ejection fraction > 55%  ?? Aortic sclerosis  ?? Dilated ascending aorta  ?? Normal right ventricular systolic function      CTA PE Protocol 02/13/18:  IMPRESSION:  No pulmonary embolism or acute airspace disease.    Since prior study dated 09/26/2016, there has been progressive mediastinal lymphadenopathy, and pulmonary fibrosis, related to end-stage sarcoidosis.   ??  Dilated main pulmonary artery  as can be seen with pulmonary hypertension.      EKG 02/13/18  SINUS TACHYCARDIA WITH 1ST DEGREE AV BLOCK  PULMONARY DISEASE PATTERN  LEFT ANTERIOR FASCICULAR BLOCK  NONSPECIFIC T WAVE ABNORMALITY  ABNORMAL ECG  WHEN COMPARED WITH ECG OF 14-Dec-2017 13:05,  PREMATURE VENTRICULAR BEATS ARE NO LONGER PRESENT  QRS AXIS SHIFTED LEFT  Confirmed by Warnell Forester ([id number]) on 02/13/2018 10:03:18 PM    LFTs  Lab Results   Component Value Date    ALKPHOS 73 08/28/2018    BILITOT 0.4 08/28/2018    BILIDIR <0.10 12/14/2017    PROT 7.8 08/28/2018    ALBUMIN 4.0 08/28/2018    ALT 11 08/28/2018    AST 15 08/28/2018      Calcium   Date Value Ref Range Status   08/28/2018 9.7 8.5 - 10.2 mg/dL Final           ASSESSMENT AND PLAN:     Karen Barber is a pleasant 53 y.o. African-American woman with history of sarcoidosis with obstructive and restrictive lung disease and aspergillus s/p LUL lobectomy with recurrence in 08/2016, now on chronic posaconazole whom we are seeing virtually for follow-up management of the same. Currently, on prednisone 5 mg since her last hospitalization 02/13/18-02/18/18.    Her symptoms improved previously with Augmentin, airway clearance and prednisone. CTA negative for PE but increased lymphadenopathy and potentially worsening parenchymal disease from prior although interpretation is limited by artifact and PE protocol study. Echo with normal cardiac function and no evidence of elevated PASP or right heart disease.  Having increasing symptoms of dyspnea on exertion as well as cough and right-sided pleuritic type pain since coming off of her prednisone in January.  Etiology of her symptoms are unclear currently include represent recurrence of her pulmonary sarcoidosis given prior CT findings, infectious giving her chronic scarring and bronchiectasis from damage as a result of her sarcoid, or possibly related to her underlying reactive airway disease.  Pulmonary hypertension is also a consideration with a potential pulmonary artery stenosis from prior PA plasty during her left upper lobectomy in 2016, although no evidence of this on prior echocardiogram.     NTM AFB from prior sputum grew M. Fortuitum. This has grown twice previously  from expectorated sputum. Unlikely to represent pathogen currently given that current symptoms improved with Prednisone and abx. Repeat AFB from recently hospitalization remain NGTD although are not finalized currently.     I suspect bronchiectasis has a central role in her symptoms especially that she is not using any airway clearance measure at this time. Improvement previously with airway clearance and abx (Augmentin) further supports this (ie, suggests contribution from bacterial infection). We discussed at length the importance of using albuterol, HTS, and her airway clearance device. I favor treating her with aggressive airway clearance and abx in the future before increasing her prednisone dose to see if her symptoms are better without steroids. I doubt asthma is contributing given lack of benefit with Nucala. Also, not sure how much Symbicort is adding while on albuterol neb q6 hrs and systemic prednisone. However, this will be important if we start weaning her off systemic corticosteroids again.     Her pulmonary sarcoidosis is challenging. I am not sure how much of her parenchymal sarcoidosis is related to active granulomatous inflammation vs burnt out sarcoidosis. PET scan will be helpful in this setting. Even if she has active disease, intensifying immunosuppressive therapy will be challenging in her case given active infection (invasive aspergillosis). There will be a risk of activating mycobacterial disease as well. If we decide to increase her steroids or add a steroid-sparing therapy, this will have to be well coordinated with our ID colleagues. Finally, will review CTA with radiology to see if there is any component of pulmonary vascular compression by hilar and mediastinal LNs that could be intervened on with stenting. This seems less likely in the absence of elevated RVSP on echo.      PLAN:    Pulmonary sarcoidosis complicated by chronic hypoxemic and hypercapnic respiratory failure:  -- Whole body PET scan to evaluate for thoracic sarcoidosis activity; will review CTA with radiology to evaluate for pulmonary vascular stenosis/compression by mediastinal/hilar LNs that would be amenable to stenting  -- Continue supplemental oxygen at 3-4 L.  We will work to obtain portable oxygen concentrator versus portable noninvasive oxygen supplementation and positive airway pressure.  -- Pulmonary rehab referral  -- CBC w diff, Ca, vitamin D (25 hydroxy vitamin D and 1,25 hydroxy vitamin D), LFTs, viral hepatitis serology  -- Annual spirometry, DLCO   -- Echo to evaluate for pulmonary hypertension  -- Annual ophthalmology and EKG (if abnormal EKG or cardiac symptoms, will proceed to cardiac MRI)  -- She has been referred for evaluation of pulmonary transplant in the past however is not a candidate due to prior PA plasty which precludes her ability to undergo the surgery for lung transplantation her thoracic surgery  -- Continue Life 2000 home ventilator.      Bronchiectasis:  -- Encouraged to use of airway clearance device, aiming for twice daily and can pre-treat with Albuterol to improve clearance  -- Continue  albuterol, ipratropium, and hypertonic saline 7% twice daily for airway clearance    Chronic Invasive Aspergillosis: s/p LUL lobectomy. Recurrence in 08/2016  -- Currently on posaconazole (changed in January 2020 from voriconazole per ID recommendations).   -- Continue to follow with ID.  -- Fungal sputum culture, lower respiratory culture and AFB today  -- If PET scan suggests active disease, will discuss with ID immunosuppressive therapy in the setting of invasive aspergillosis    Asthma:  -- Continue Nucala and Symbicort inh    NTM AFB from prior sputum (M. Fortuitum), likely represents colonization/contamination. Has  not grown on most recent AFB cultures. No indication for treatment currently.  --Repeat AFB sputum culture every visit    OSA symptoms:  -- Sleep study     Health maintenance:  -- Vaccines: up-to-date; annual flu  -- Smoking cessation: discussed at length/N/A  -- Lung cancer screening: N/A      RTC as previously scheduled in 3 months     Virjean Boman O. Wellington Hampshire, MD   Assistant Professor of Medicine  Pulmonary and Critical Care Medicine  [facility] Pulmonary Hypertension Program          I spent 35 minutes on the video call with the patient. I spent an additional 35 minutes on pre- and post-visit activities.      The patient was physically located in [place] or a state in which I am permitted to provide care. The patient  understood that s/he may incur co-pays and cost sharing, and agreed to the telemedicine visit. The visit was completed viavideo, which was appropriate and reasonable under the circumstances given the patient's presentation at the time.     The patient has been advised of the potential risks and limitations of this mode of treatment (including, but not limited to, the absence of in-person examination) and has agreed to be treated using telemedicine. The patient's questions regarding telemedicine have been answered.      If the video visit was completed in an ambulatory setting, the patient has also been advised to contact their provider???s office for worsening conditions, and seek emergency medical treatment and/or call 911 if the patient deems either necessary.

## 2018-11-19 ENCOUNTER — Telehealth: Admit: 2018-11-19 | Discharge: 2018-11-20 | Payer: MEDICARE | Attending: Pulmonary Disease | Primary: Pulmonary Disease

## 2018-11-19 DIAGNOSIS — R0609 Other forms of dyspnea: Secondary | ICD-10-CM

## 2018-11-19 DIAGNOSIS — D860 Sarcoidosis of lung: Secondary | ICD-10-CM

## 2018-11-19 DIAGNOSIS — R590 Localized enlarged lymph nodes: Secondary | ICD-10-CM

## 2018-11-19 DIAGNOSIS — R911 Solitary pulmonary nodule: Secondary | ICD-10-CM

## 2018-11-19 NOTE — Unmapped (Addendum)
PLAN  -- Echocardiography (someone will call you to schedule)  -- PET scan (someone will call you to schedule)  -- Continue prednisone 5 mg, Symbicort, Nucala, and posaconazole   -- Continue Albuterol,  ipratropium, and hypertonic saline 7% twice daily for airway clearance  -- Continue Airway clearance device twice a day  -- Call [facility] pulmonary clinic to schedule lung function test and when you come, stop by the lab for blood test      Thank you for allowing me to be a part of your care.  Please call the clinic with any questions.  ??  Christen Butter, MD  Pulmonary and Critical Care Medicine  [address]  [id number]  [place], [place] [address]  ??  Thank you for your visit to the [facility].   ??  Between appointments, you can reach [place] at these numbers:  ??  For appointments or the Pulmonary Nurse: [phone]  Fax: [phone]  ??  For urgent issues after hours:  Hospital Operator: [phone], ask for Pulmonary Fellow on call    Ramata Strothman O. Wellington Hampshire, MD   Assistant Professor of Medicine  Pulmonary and Critical Care Medicine  [facility] Pulmonary Hypertension Program

## 2018-12-05 NOTE — Unmapped (Signed)
[facility] Refill Coordination Note    Specialty Medication(s) to be Shipped:   CF/Pulmonary: -Nucala 100mg /ml       Karen Barber, DOB: 1965/08/22  Phone: [phone] (home) [phone] (work)    All above HIPAA information was verified with patient.     Completed refill call assessment today to schedule patient's medication shipment from the [facility] [phone]).       Specialty medication(s) and dose(s) confirmed: Regimen is correct and unchanged.   Changes to medications: [name] reports no changes at this time.  Changes to insurance: No  Questions for the pharmacist: No    Confirmed patient received Welcome Packet with first shipment. The patient will receive a drug information handout for each medication shipped and additional FDA Medication Guides as required.       DISEASE/MEDICATION-SPECIFIC INFORMATION        For patients on injectable medications: Patient currently has 0 doses left.  Next injection is scheduled for 12/12/2018. Patient Not sure on the exact date.        For CF patients: CF [facility] Active? No-not enrolled    SPECIALTY MEDICATION ADHERENCE     Medication Adherence    Patient reported X missed doses in the last month: 0  Specialty Medication: Nucala 100mg /ml  Patient is on additional specialty medications: No        Nucala 179mh/ml : 0 days of medicine on hand     SHIPPING     Shipping address confirmed in Epic.     Delivery Scheduled: Yes, Expected medication delivery date: 12/09/2018.     Medication will be delivered via Same Day Courier to the home address in Epic [place].    Amazing Cowman P Allena Katz   [facility] Specialty Technician

## 2018-12-09 ENCOUNTER — Ambulatory Visit: Admit: 2018-12-09 | Discharge: 2018-12-10 | Payer: MEDICARE

## 2018-12-09 DIAGNOSIS — R911 Solitary pulmonary nodule: Secondary | ICD-10-CM

## 2018-12-09 MED FILL — NUCALA 100 MG/ML SUBCUTANEOUS AUTO-INJECTOR: SUBCUTANEOUS | 28 days supply | Qty: 1 | Fill #5

## 2018-12-09 MED FILL — NUCALA 100 MG/ML SUBCUTANEOUS AUTO-INJECTOR: 28 days supply | Qty: 1 | Fill #5 | Status: AC

## 2018-12-09 NOTE — Unmapped (Signed)
Tried to contact patient a couple of times to discuss results of PET scan. She didn't answer the phone. Left her a message.

## 2018-12-12 NOTE — Unmapped (Signed)
[place] Assessment of Medications Program (CAMP)                        RECRUITMENT SUMMARY NOTE     The following patient was outreached for CAMP services. Letter sent    Eriel Dunckel [place]  Clinical Operations Specialist/CPhT  [employer] of [facility] (CAMP)  (P) [phone]

## 2018-12-17 DIAGNOSIS — J471 Bronchiectasis with (acute) exacerbation: Secondary | ICD-10-CM

## 2018-12-17 MED ORDER — LEVOFLOXACIN 750 MG TABLET
ORAL_TABLET | Freq: Every day | ORAL | 0 refills | 7 days | Status: CP
Start: 2018-12-17 — End: 2018-12-20

## 2018-12-17 NOTE — Unmapped (Signed)
Latest clinic notes e-routed to [facility] Phone: Phone: [phone] for Life 2000.  Of note, the L2K is not compatable with POC's.

## 2018-12-17 NOTE — Unmapped (Signed)
Spoke to patient on the phone and discussed results of PET scan. She is having more shortness of breath than usual. Also, cough is worse and she feels that she has more sputum but she is unable to cough it up. She states that her SpO2 is 96% now. No fever, hemoptysis, chest pain, or LE swelling. Will treat with aggressive airway clearance (albuterol, HTS, and device) and abx (levofloxacin) for possible acute exacerbation of bronchiectasis. If no improvement in 1 week, will start her on steroid burst for sarcoid flare. At that point, will discuss with ID re: initiation of methotrexate.

## 2018-12-19 DIAGNOSIS — B441 Other pulmonary aspergillosis: Secondary | ICD-10-CM

## 2018-12-20 DIAGNOSIS — J471 Bronchiectasis with (acute) exacerbation: Secondary | ICD-10-CM

## 2018-12-20 MED ORDER — POSACONAZOLE 100 MG TABLET,DELAYED RELEASE
ORAL_TABLET | Freq: Every day | ORAL | 2 refills | 30 days | Status: CP
Start: 2018-12-20 — End: ?

## 2018-12-20 MED ORDER — AMOXICILLIN 875 MG-POTASSIUM CLAVULANATE 125 MG TABLET
ORAL_TABLET | Freq: Two times a day (BID) | ORAL | 0 refills | 14.00000 days | Status: CP
Start: 2018-12-20 — End: 2018-12-30

## 2018-12-20 NOTE — Unmapped (Signed)
Spoke to patient re: side effects of levofloxacine. Asked her to stop it and start Augmentin which was previously tolerated.

## 2018-12-23 NOTE — Unmapped (Signed)
[place] Assessment of Medications Program (CAMP)                        RECRUITMENT SUMMARY NOTE     The following patient was outreached for CAMP services. Patient declined/withdrawn    Patient declined CAMP services.  Declined reason: Patient feels they would not benefit from service at this time     Karen Barber  Clinical Operations Specialist/CPhT  [employer] [facility] (CAMP)  (P) [phone]

## 2018-12-23 NOTE — Unmapped (Signed)
PCP is not at IM clinic.

## 2018-12-23 NOTE — Unmapped (Signed)
Re: L2k MyChart message:    Hi Dwyne Hasegawa, she called me back, and was asking for another interface.  Placed an order for her and it will ship today.    Elon Spanner, BS RCP  Clinical Sales Specialist- [facility]    Cell:       [phone]                     [phone]  Office:  [phone]  Fax:       [phone]  Email:   [email or web address]

## 2018-12-24 DIAGNOSIS — I10 Essential (primary) hypertension: Secondary | ICD-10-CM

## 2018-12-24 DIAGNOSIS — E1165 Type 2 diabetes mellitus with hyperglycemia: Secondary | ICD-10-CM

## 2018-12-24 NOTE — Unmapped (Signed)
Pharmacy Refill Request  Pravastatin 40mg     Last OV:   12/20/2018    Last written Rx:   08/16/2018    Upcoming appointment:   N/A

## 2018-12-25 MED ORDER — INSULIN ASPART U-100  100 UNIT/ML SUBCUTANEOUS SOLUTION
1 refills | 0 days | Status: CP
Start: 2018-12-25 — End: 2019-12-25

## 2018-12-25 MED ORDER — INSULIN LISPRO (U-100) 100 UNIT/ML SUBCUTANEOUS SOLUTION
Freq: Four times a day (QID) | SUBCUTANEOUS | 1 refills | 100.00000 days | Status: CP
Start: 2018-12-25 — End: 2018-12-25

## 2018-12-27 ENCOUNTER — Telehealth: Admit: 2018-12-27 | Discharge: 2018-12-28 | Payer: MEDICARE

## 2018-12-27 DIAGNOSIS — B441 Other pulmonary aspergillosis: Secondary | ICD-10-CM

## 2018-12-27 DIAGNOSIS — E1165 Type 2 diabetes mellitus with hyperglycemia: Secondary | ICD-10-CM

## 2018-12-27 DIAGNOSIS — J984 Other disorders of lung: Secondary | ICD-10-CM

## 2018-12-27 DIAGNOSIS — R0609 Other forms of dyspnea: Secondary | ICD-10-CM

## 2018-12-27 DIAGNOSIS — Z794 Long term (current) use of insulin: Secondary | ICD-10-CM

## 2018-12-27 NOTE — Unmapped (Signed)
ID Clinic Note - Follow-up Visit    ASSESSMENT AND PLAN:  53yoF w/ sarcoidosis and h/o chronic cavitary pulmonary aspergillosis in 2010 s/p LUL lobectomy w/ VATS on 12/03/14 who had frequent hospitalizations in 2018 for respiratory issues/infections, including in 09/2016 when she was found to have a L apical thick-walled cavitary lesion w/ adjacent lung parenchymal abnormalities and sputum culture positive for Aspergillus [place] and Achromobacter spp. Treated with voriconazole for chronic pulmonary aspergillosis, transitioned to posaconazole to reduce potential toxicities after discussing risks and benefits of stopping therapy completely as it was unlikely to be curative and she is not a transplant candidate due to anatomical concerns.  ??  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     Continues to have intermittent, relatively frequent flares of pulmonary symptoms, particularly SOB/DOE and productive cough, that improve with abx + steroids. Likely multifactorial - per pulmonology concerns for bronchiectasis v sarcoidosis being primary drivers, with possible pulmonary HTN as well. As she did not have a durable response to Nucala, asthma thought to be a less likely contributor. PET CT c/w metabolocally active sarcoid, so increased immunosuppression may be something that is needed in the future. Given known chronic aspergillosis, she would certainly be at risk for worsening infection, but is doing well on posaconazole tx. It may be worth the risk to decrease frequency of flares of respiratory sxs - will discuss with Ms. Cumbo and Dr. Wellington Hampshire to determine best course of therapy, should the need for further immunosuppression arise.    Today:  - CONTINUE posaconazole 200mg  daily while on steroids. Obtaining safety labs through PCP ~monthly - will have them done 9/12 when she comes for TTE.  - Complete Amox/clav course as prescribed by Pulmonology.    ??  ID Problem List:  Sarcoidosis 1995; Severe Eosinophilic Asthma  - Only immunomodulator: intermittent systemic steroid bursts/tapers, more frequent recently  - On 3L Home O2, 4L w/ activity (her baseline)  - Worsening respiratory symptoms w/ mediastinal lymphadenopathy and fibrosis progressive on 01/2018 CT concerning for progression of sarcoidosis  - PET CT 08/2018 with metabolically active pulm fibrosis and hilar/mediastional LNs  - Follows with Pulmonology, Dr. Kearney Hard --> Dr. Wellington Hampshire  - Not a transplant candidate 2/2 anatomy after past surgery  ??  Chronic cavitary pulmonary aspergillosis, first diagnosed 2010  - previously treated with multiple courses of voriconazole  - 2016 cultures with Aspergillus fumigatus  - s/p LUL lobectomy/VATS 12/03/14 after episodes of hemoptysis  - s/p voriconazole 11/20/14-04/2015   - New L apical thick walled cavitary lesion and adjacent lung parenchymal abnormalities 09/08/16 in setting of 1 month of worsening pulmonary??infectious??symptoms, leukocytosis (on steroids)  - 6/22 and 6/23 sputum cultures: Aspergillus Luxem[place], OPF.  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  - 02/13/2018 CT: decreased size of L apical cavity soft tissue density  - Posaconazole 03/2018 - (dose decreased based on levels to 200mg  daily)  ??  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  - S/p 7 weeks of TMP/sulf + moxifloxacin from 04/2018 - 06/2018 without improvement  ??  Pertinent past Infections  - H/o Achromobacter pneumonia 11/18/14, treated w meropenem x 14 d; 09/25/16, treated with 4 weeks meropenem  - H/o Stenotrophomonas (S: minocycline, I: levoflox R: bactrim, ceftaz)??PNA 12/03/14 treated with minocycline x 14d  - Hepatitis C Ab-positive, VL neg  - Nutritionally variant Strep bacteremia, 06/2014  ??  ??  Education and counseling took 15 minutes of today's visit.  ??  Duanne Limerick, MD, MHS  Fellow, [facility] Division of Infectious Diseases  ??  [facility]   1st floor [facility]  [address]  [place], [place]. [address]   Phone: [phone]   Fax: [phone]   ??  _____________________________________________________________________  ??  ??  CC: F/u of mycetoma, chronic cavitary pulmonary aspergillosis  ??  HPI: ??53yoF w/ h/o sarcoidosis, severe eosinophilic asthma, chronic cavitary pulmonary aspergillosis s/p resection in 2016 currently on posaconazole for suppressive therapy, and positive M.fortuitum sputum cultures. At last visit was doing well on Nucala per pulmonologist for treatment of severe eosinophilic asthma and prednisone.    Since last visit:  - Established care with new pulmonary provider, Dr. Wellington Hampshire. Concern for bronchiectasis being a primary driver of symptoms, so increasing airway clearance. Also evaluating for active inflammation from sarcoidosis - PET CT with metabolically active fibrosis and mediastinal/supraclavicular nodes. TTE planned for tmrw to evaluate for pulmonary hypertension as contributing cause to her frequent recurrences of DOE.  - At the end of Sept, developed more SOB that usual with cough and increased sputum. Treated with aggressive airway clearance and abx (she did not tolerated levofloxacin so switched to Augmentin).    Today, reports she is getting better, albeit more slowly than usual, which she attributes to being on antibiotics + her baseline prednisone dose only (5mg ). On baseline home O2. Continues chronic posaconazole without issue. Had gained lots of weight with frequent prednisone bursts over the last ~year, so now trying to lose weight by only eating one meal a day of vegetables and cutting out sweets and sodas.     Past Medical History:   Diagnosis Date   ??? 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 Breast injury     lung surg on left incision under breast sept 2016   ??? Hepatitis C antibody test positive 2016    repeatedly negative HCV RNA, indicating clearance of infection w/o treatment   ??? Hyperlipidemia    ??? Hypertension    ??? 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 On home O2 2008    per Dr Mal Amabile note from 02/21/2017   ??? Pulmonary aspergillosis (CMS-HCC) 2016    A.fumigatus in 2016, prior to LUL lobectomy. A.niger from sputum 08/2016-09/2016.   ??? 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 Sarcoidosis 1995   ??? Type 2 diabetes mellitus with hyperglycemia (CMS-HCC) 07/27/2014       Past Surgical History:   Procedure Laterality Date   ??? LUNG LOBECTOMY     ??? PR THORACOSCOPY SURG TOT PULM DECORT Left 12/14/2014    Procedure: THORACOSCOPY SURG; W/TOT PULM DECORTIC/PNEUMOLYS;  Surgeon: Evert Kohl, MD;  Location: MAIN OR [facility];  Service: Cardiothoracic   ??? PR THORACOSCOPY W/THERA  WEDGE RESEXN INITIAL UNILAT Left 12/03/2014    Procedure: THORACOSCOPY, SURGICAL; WITH THERAPEUTIC WEDGE RESECTION (EG, MASS, NODULE) INITIAL UNILATERAL;  Surgeon: Evert Kohl, MD;  Location: MAIN OR [facility];  Service: Cardiothoracic       No Known Allergies    Current Outpatient Medications   Medication Sig Dispense Refill   ??? ACCU-CHEK FASTCLIX LANCET DRUM Misc USE TO CHECK GLUCOSE 4 TIMES DAILY BEFORE MEAL(S) AND NIGHTLY     ??? ACCU-CHEK GUIDE GLUCOSE METER Misc 1 Device by Other route Four (4) times a day (before meals and nightly). AS DIRECTED 1 kit 0   ??? albuterol 2.5 mg /3 mL (0.083 %) nebulizer solution Inhale 3 mL (2.5 mg total) by nebulization every six (6) hours as needed for wheezing or shortness of breath (airway clearance/cough). 120 vial 11   ??? amoxicillin-clavulanate (AUGMENTIN) 875-125 mg per tablet Take 1 tablet by mouth Two (2) times a day for 10 days. 28 tablet 0   ??? blood sugar diagnostic Strp by Other route Four (4) times a day (before meals and nightly). 400 strip 1   ??? cyclobenzaprine (FLEXERIL) 10 MG tablet Take 1 tablet (10 mg total) by mouth Three (3) times a day as needed. 90 each 2   ??? empty container Misc USE AS DIRECTED 1 each 2   ??? gabapentin (NEURONTIN) 300 MG capsule Take 1 capsule (300 mg total) by mouth Three (3) times a day. 270 capsule 1   ??? insulin ASPART (NOVOLOG U-100 INSULIN ASPART) 100 unit/mL injection Take 1-20 units SQ QID AC and HS as directed 80 mL 1   ??? insulin glargine (BASAGLAR, LANTUS) 100 unit/mL (3 mL) injection pen 18 units 30 pen 3   ??? ipratropium (ATROVENT) 0.02 % nebulizer solution Inhale 2.5 mL (500 mcg total) by nebulization Four (4) times a day. 300 mL 3   ??? MEDICAL SUPPLY ITEM Accucheck Glide Glocumeter for home glucose checks. Check blood glucose ACHS. 1 Package 0   ??? mepolizumab 100 mg/mL AtIn Inject 100 mg under the skin every twenty-eight (28) days. 1 mL 12   ??? metFORMIN (GLUCOPHAGE) 1000 MG tablet Take 1 tablet (1,000 mg total) by mouth 2 (two) times a day with meals. 180 tablet 1   ??? miscellaneous medical supply Misc Nebulizer machine and tubing. Patients machine is 55 years old. 1 each 0   ??? OXYGEN-AIR DELIVERY SYSTEMS MISC Dose: 2-3 LPM     ??? posaconazole (NOXAFIL) 100 mg TbEC delayed released tablet Take 200 mg by mouth daily. 60 tablet 2   ??? pravastatin (PRAVACHOL) 40 MG tablet Take 1 tablet (40 mg total) by mouth daily with evening meal. 90 tablet 1   ??? albuterol HFA 90 mcg/actuation inhaler Inhale 2 puffs every four (4) hours as needed for wheezing or shortness of breath. 2 Inhaler 11   ??? budesonide-formoterol (SYMBICORT) 160-4.5 mcg/actuation inhaler Inhale 2 puffs Two (2) times a day. 3 Inhaler 2   ??? EPINEPHrine (EPIPEN 2-PAK) 0.3 mg/0.3 mL injection Inject 0.3 mL (0.3 mg total) into the muscle once for 1 dose. 1 Device 1   ??? hydroCHLOROthiazide (HYDRODIURIL) 25 MG tablet Take 1 tablet (25 mg total) by mouth daily. TAKE ONE (1) TABLET BY MOUTH EVERY DAY 90 tablet 1     No current facility-administered medications for this visit.        Social history:  Reviewed and unchanged except as noted in the HPI.    Family History   Problem Relation Age of Onset   ???  Diabetes Father    ??? Diabetes Brother    ??? Diabetes Maternal Aunt    ??? Sarcoidosis Maternal Aunt    ??? Diabetes Maternal Uncle    ??? Diabetes Paternal Aunt    ??? Diabetes Paternal Uncle        ROS: 12 systems reviewed and negative except as per HPI.     PE:  No vitals done because this was video visit.  GEN: alert, NAD, speaking in full sentences  Eyes: sclera anicteric  ENT: nasal cannula in place  RESP: normal WOB  PSYCH: normal affect and mood, pleasant and interactive.    Labs:    Lab Results   Component Value Date    ALKPHOS 73 08/28/2018    BILITOT 0.4 08/28/2018    BILIDIR <0.10 12/14/2017    PROT 7.8 08/28/2018    ALBUMIN 4.0 08/28/2018    ALT 11 08/28/2018    AST 15 08/28/2018     Lab Results   Component Value Date    WBC 6.5 08/28/2018    HGB 11.6 (L) 08/28/2018    HCT 36.8 08/28/2018    MCV 84.3 08/28/2018    RDW 15.9 (H) 08/28/2018    PLT 304 08/28/2018    NEUTROPCT 55.0 05/22/2018    LYMPHOPCT 22.2 05/22/2018    MONOPCT 12.2 05/22/2018    EOSPCT 6.0 05/22/2018    BASOPCT 0.7 05/22/2018         Drug monitoring:  Posa level 05/22/18: 2,552 --> dose decreased to 200mg  daily  Posa level 08/28/18: 1,636 --> continue 200mg  daily    Lab Results   Component Value Date    ALKPHOS 73 08/28/2018    BILITOT 0.4 08/28/2018    BILIDIR <0.10 12/14/2017    PROT 7.8 08/28/2018    ALBUMIN 4.0 08/28/2018    ALT 11 08/28/2018    AST 15 08/28/2018       Microbiology:  2020:  04/25/18: sputum AFB cx +mycobacterium fortuitum    2019:  02/14/18: OPF, fungal neg, AFB NGTD; galactomannan <0.5, fungitell neg  9/19, 9/29: AFB cx +mycobacterium fortuitum  Mycobacterium fortuitum group       MIC SUSCEPTIBILITY RESULT     Amikacin 2  Susceptible     Cefoxitin 16  Susceptible     Ciprofloxacin 0.5  Susceptible     Clarithromycin >16  Resistant1     Doxycycline 4  Intermediate     Imipenem 8  Intermediate2     Linezolid 2  Susceptible     Minocycline 2  Intermediate     Moxifloxacin 0.25  Susceptible     Tigecycline 0.03  No Interpretation     Trimethoprim + Sulfamethoxazole 0.25  Susceptible      2018:  02/19/17: OPF, fungal neg, AFB neg  7/12 LRCx: Aspergillus [place], 3+ Achromobacter species, 3+ Enterococcus faecium  7/10 Sputum fungal culture: Aspergillus [place]  7/9 LRCx: Aspergillus [place], OP flora, Achromobacter spp.    AFB smear x3 negative; cx NGTD      Imaging:   No recent imaging.    I spent 20 minutes on the real-time audio and video with the patient. I spent an additional 15 minutes on pre- and post-visit activities.     The patient was physically located in [place] or a state in which I am permitted to provide care. The patient and/or parent/guardian understood that s/he may incur co-pays and cost sharing, and agreed to the telemedicine visit. The visit was reasonable and appropriate under  the circumstances given the patient's presentation at the time.    The patient and/or parent/guardian has been advised of the potential risks and limitations of this mode of treatment (including, but not limited to, the absence of in-person examination) and has agreed to be treated using telemedicine. The patient's/patient's family's questions regarding telemedicine have been answered.     If the visit was completed in an ambulatory setting, the patient and/or parent/guardian has also been advised to contact their provider???s office for worsening conditions, and seek emergency medical treatment and/or call 911 if the patient deems either necessary.

## 2018-12-30 ENCOUNTER — Ambulatory Visit: Admit: 2018-12-30 | Discharge: 2018-12-30 | Payer: MEDICARE

## 2018-12-30 DIAGNOSIS — I10 Essential (primary) hypertension: Principal | ICD-10-CM

## 2018-12-30 DIAGNOSIS — D869 Sarcoidosis, unspecified: Principal | ICD-10-CM

## 2018-12-30 DIAGNOSIS — E1165 Type 2 diabetes mellitus with hyperglycemia: Principal | ICD-10-CM

## 2018-12-30 DIAGNOSIS — J449 Chronic obstructive pulmonary disease, unspecified: Principal | ICD-10-CM

## 2018-12-30 NOTE — Unmapped (Signed)
[facility] Clinical Assessment & Refill Coordination Note    Karen Barber, DOB: [birth year]  Phone: [phone] (home) [phone] (work)    All above HIPAA information was verified with patient.     Patient requested 7% HTS as she states she has a hard time coughing things up and wants the higher strength     Specialty Medication(s):   CF/Pulmonary: -Nucala 100mg /ml     Current Outpatient Medications   Medication Sig Dispense Refill   ??? ACCU-CHEK FASTCLIX LANCET DRUM Misc USE TO CHECK GLUCOSE 4 TIMES DAILY BEFORE MEAL(S) AND NIGHTLY     ??? ACCU-CHEK GUIDE GLUCOSE METER Misc 1 Device by Other route Four (4) times a day (before meals and nightly). AS DIRECTED 1 kit 0   ??? albuterol 2.5 mg /3 mL (0.083 %) nebulizer solution Inhale 3 mL (2.5 mg total) by nebulization every six (6) hours as needed for wheezing or shortness of breath (airway clearance/cough). 120 vial 11   ??? albuterol HFA 90 mcg/actuation inhaler Inhale 2 puffs every four (4) hours as needed for wheezing or shortness of breath. 2 Inhaler 11   ??? amoxicillin-clavulanate (AUGMENTIN) 875-125 mg per tablet Take 1 tablet by mouth Two (2) times a day for 10 days. 28 tablet 0   ??? blood sugar diagnostic Strp by Other route Four (4) times a day (before meals and nightly). 400 strip 1   ??? budesonide-formoterol (SYMBICORT) 160-4.5 mcg/actuation inhaler Inhale 2 puffs Two (2) times a day. 3 Inhaler 2   ??? cyclobenzaprine (FLEXERIL) 10 MG tablet Take 1 tablet (10 mg total) by mouth Three (3) times a day as needed. 90 each 2   ??? empty container Misc USE AS DIRECTED 1 each 2   ??? EPINEPHrine (EPIPEN 2-PAK) 0.3 mg/0.3 mL injection Inject 0.3 mL (0.3 mg total) into the muscle once for 1 dose. 1 Device 1   ??? gabapentin (NEURONTIN) 300 MG capsule Take 1 capsule (300 mg total) by mouth Three (3) times a day. 270 capsule 1 ??? hydroCHLOROthiazide (HYDRODIURIL) 25 MG tablet Take 1 tablet (25 mg total) by mouth daily. TAKE ONE (1) TABLET BY MOUTH EVERY DAY 90 tablet 1   ??? insulin ASPART (NOVOLOG U-100 INSULIN ASPART) 100 unit/mL injection Take 1-20 units SQ QID AC and HS as directed 80 mL 1   ??? insulin glargine (BASAGLAR, LANTUS) 100 unit/mL (3 mL) injection pen 18 units 30 pen 3   ??? ipratropium (ATROVENT) 0.02 % nebulizer solution Inhale 2.5 mL (500 mcg total) by nebulization Four (4) times a day. 300 mL 3   ??? MEDICAL SUPPLY ITEM Accucheck Glide Glocumeter for home glucose checks. Check blood glucose ACHS. 1 Package 0   ??? mepolizumab 100 mg/mL AtIn Inject 100 mg under the skin every twenty-eight (28) days. 1 mL 12   ??? metFORMIN (GLUCOPHAGE) 1000 MG tablet Take 1 tablet (1,000 mg total) by mouth 2 (two) times a day with meals. 180 tablet 1   ??? miscellaneous medical supply Misc Nebulizer machine and tubing. Patients machine is 55 years old. 1 each 0   ??? OXYGEN-AIR DELIVERY SYSTEMS MISC Dose: 2-3 LPM     ??? posaconazole (NOXAFIL) 100 mg TbEC delayed released tablet Take 200 mg by mouth daily. 60 tablet 2   ??? pravastatin (PRAVACHOL) 40 MG tablet Take 1 tablet (40 mg total) by mouth daily with evening meal. 90 tablet 1     No current facility-administered medications for this visit.  Changes to medications: [name] reports no changes at this time.    No Known Allergies    Changes to allergies: No    SPECIALTY MEDICATION ADHERENCE     Nucala 100 mg/ml: 0 days of medicine on hand       Medication Adherence    Patient reported X missed doses in the last month: 0  Specialty Medication: Nucala 100mg /ml  Patient is on additional specialty medications: No  Informant: patient          Specialty medication(s) dose(s) confirmed: Regimen is correct and unchanged.     Are there any concerns with adherence? No    Adherence counseling provided? Not needed    CLINICAL MANAGEMENT AND INTERVENTION      Clinical Benefit Assessment: Do you feel the medicine is effective or helping your condition? Yes does not wheeze anymore so thinks it is working     Clinical Benefit counseling provided? Not needed    Adverse Effects Assessment:    Are you experiencing any side effects? No    Are you experiencing difficulty administering your medicine? No    Quality of Life Assessment:    How many days over the past month did your asthma  keep you from your normal activities? For example, brushing your teeth or getting up in the morning. Patient declined to answer    Have you discussed this with your provider? Not needed    Therapy Appropriateness:    Is therapy appropriate? Yes, therapy is appropriate and should be continued    DISEASE/MEDICATION-SPECIFIC INFORMATION      For patients on injectable medications: Patient currently has 0 doses left.  Next injection is scheduled for 10/27.    PATIENT SPECIFIC NEEDS     ? Does the patient have any physical, cognitive, or cultural barriers? No    ? Is the patient high risk? No     ? Does the patient require a Care Management Plan? No     ? Does the patient require physician intervention or other additional services (i.e. nutrition, smoking cessation, social work)? No      SHIPPING     Specialty Medication(s) to be Shipped:   CF/Pulmonary: -Nucala 100mg /ml    Other medication(s) to be shipped: HTs 7%      Changes to insurance: No    Delivery Scheduled: Yes, Expected medication delivery date: 01/09/19.     Medication will be delivered via Same Day Courier to the confirmed home address in [facility].    The patient will receive a drug information handout for each medication shipped and additional FDA Medication Guides as required.  Verified that patient has previously received a Conservation officer, historic buildings.    All of the patient's questions and concerns have been addressed.    Julianne Rice   [facility] Specialty Pharmacist

## 2018-12-31 MED ORDER — PRAVASTATIN 40 MG TABLET: 40 mg | tablet | Freq: Every day | 1 refills | 90 days | Status: AC

## 2018-12-31 MED ORDER — ALBUTEROL SULFATE 2.5 MG/3 ML (0.083 %) SOLUTION FOR NEBULIZATION: 3 mg | vial | Freq: Four times a day (QID) | 11 refills | 30 days | Status: AC

## 2018-12-31 NOTE — Unmapped (Signed)
Pharmacy Refill (Patient) Request    Albuterol neb  Pravachol    Last OV: 11/19/2018    Last written Rx:   Albuterol neb-06/10/2018  Pravachol-08/16/2018      Upcoming appointment: None seen at this time.     Thank you

## 2018-12-31 NOTE — Unmapped (Signed)
Spoke to patient and discussed results of echo. Plan remains the same.

## 2019-01-02 MED ORDER — CYCLOBENZAPRINE 10 MG TABLET: tablet | 0 refills | 0 days | Status: AC

## 2019-01-08 ENCOUNTER — Ambulatory Visit: Admit: 2019-01-08 | Discharge: 2019-01-09 | Payer: MEDICARE | Attending: Family | Primary: Family

## 2019-01-08 DIAGNOSIS — Z23 Encounter for immunization: Principal | ICD-10-CM

## 2019-01-08 DIAGNOSIS — R768 Other specified abnormal immunological findings in serum: Principal | ICD-10-CM

## 2019-01-08 NOTE — Unmapped (Signed)
Assessment and Plan:     Karen Barber was seen today for hepatitis c.    Diagnoses and all orders for this visit:    Hepatitis C antibody test positive  Hep C antibody positive in 2016.   Last Hep C RNA non reactive 11/18/14.  Recheck RNA today. Will contact patient with results.   -     Hepatitis C RNA, Quantitative, PCR    Need for influenza vaccination  Influenza vaccine administered in clinic today.  -     INFLUENZA VACCINE (QUAD) IM - 6 MO-ADULT - PF    HPI:      Karen Barber  is here for   Chief Complaint   Patient presents with   ??? Hepatitis C     Patient presents for follow up of positive Hep C antibody testing. She states she was notified of positive Hep C 4 years ago, but that her PCP left the practice before they were able to discuss result. Review of labs show positive Hep C antibody on 10/28/14, 11/17/14, and 11/24/14. Hep C RNA non reactive on 11/02/14 and 11/18/18.    She reports sarcoidosis flare, noting severe SOB for 1 month. She states O2 sat will drop to 59-70% with HR of 102 when walking short distances. Patient reports specialist prescribed levofloxacin, but that she experienced intolerable side effects. Specialist discontinued levofloxacin and initiated Augmentin, which patient also self-discontinued due to side effects of diarrhea. She is scheduled to see pulmonology tomorrow to discuss.      [facility] Components:     Goals     ??? Get out more often and participate in more activities      Pt wanting to get my energy level back by getting out more and doing more to prevent feeling depressed.            Medication adherence and barriers to the treatment plan have been addressed. Opportunities to optimize healthy behaviors have been discussed. Patient / caregiver voiced understanding.      Past Medical/Surgical History:     Past Medical History:   Diagnosis Date   ??? 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 Breast injury     lung surg on left incision under breast sept 2016   ??? Hepatitis C antibody test positive 2016    repeatedly negative HCV RNA, indicating clearance of infection w/o treatment   ??? Hyperlipidemia    ??? Hypertension    ??? 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 On home O2 2008    per Dr Mal Amabile note from 02/21/2017   ??? Pulmonary aspergillosis (CMS-HCC) 2016    A.fumigatus in 2016, prior to LUL lobectomy. A.niger from sputum 08/2016-09/2016.   ??? 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 Sarcoidosis 1995   ??? Type 2 diabetes mellitus with hyperglycemia (CMS-HCC) 07/27/2014     Past Surgical History:   Procedure Laterality Date   ??? LUNG LOBECTOMY     ??? PR THORACOSCOPY SURG TOT PULM DECORT Left 12/14/2014    Procedure: THORACOSCOPY SURG; W/TOT PULM DECORTIC/PNEUMOLYS;  Surgeon: Evert Kohl, MD;  Location: MAIN OR [facility];  Service: Cardiothoracic   ??? PR THORACOSCOPY W/THERA WEDGE RESEXN INITIAL UNILAT Left 12/03/2014    Procedure: THORACOSCOPY, SURGICAL; WITH THERAPEUTIC WEDGE RESECTION (EG, MASS, NODULE) INITIAL UNILATERAL;  Surgeon: Evert Kohl, MD;  Location: MAIN OR [facility];  Service: Cardiothoracic  Family History:     Family History   Problem Relation Age of Onset   ??? Diabetes Father    ??? Diabetes Brother    ??? Diabetes Maternal Aunt    ??? Sarcoidosis Maternal Aunt    ??? Diabetes Maternal Uncle    ??? Diabetes Paternal Aunt    ??? Diabetes Paternal Uncle        Social History:     Social History     Socioeconomic History   ??? Marital status: Single     Spouse name: None   ??? Number of children: None   ??? Years of education: None   ??? Highest education level: None   Occupational History   ??? None   Social Needs   ??? Financial resource strain: None   ??? Food insecurity     Worry: None     Inability: None   ??? Transportation needs     Medical: None     Non-medical: None   Tobacco Use   ??? Smoking status: Former Smoker     Packs/day: 0.50     Years: 9.00 Pack years: 4.50     Types: Cigarettes     Quit date: 05/03/1987     Years since quitting: 31.7   ??? Smokeless tobacco: Never Used   Substance and Sexual Activity   ??? Alcohol use: No     Alcohol/week: 0.0 standard drinks   ??? Drug use: No   ??? Sexual activity: Yes   Lifestyle   ??? Physical activity     Days per week: None     Minutes per session: None   ??? Stress: None   Relationships   ??? Social Wellsite geologist on phone: None     Gets together: None     Attends religious service: None     Active member of club or organization: None     Attends meetings of clubs or organizations: None     Relationship status: None   Other Topics Concern   ??? Do you use sunscreen? No   ??? Tanning bed use? No   ??? Are you easily burned? No   ??? Excessive sun exposure? No   ??? Blistering sunburns? No   Social History Narrative   ??? None       Allergies:     Patient has no known allergies.    Current Medications:     Current Outpatient Medications   Medication Sig Dispense Refill   ??? ACCU-CHEK FASTCLIX LANCET DRUM Misc USE TO CHECK GLUCOSE 4 TIMES DAILY BEFORE MEAL(S) AND NIGHTLY     ??? ACCU-CHEK GUIDE GLUCOSE METER Misc 1 Device by Other route Four (4) times a day (before meals and nightly). AS DIRECTED 1 kit 0   ??? albuterol 2.5 mg /3 mL (0.083 %) nebulizer solution Inhale 3 mL (2.5 mg total) by nebulization every six (6) hours as needed for wheezing or shortness of breath (airway clearance/cough). 120 vial 11   ??? albuterol HFA 90 mcg/actuation inhaler Inhale 2 puffs every four (4) hours as needed for wheezing or shortness of breath. 2 Inhaler 11   ??? blood sugar diagnostic Strp by Other route Four (4) times a day (before meals and nightly). 400 strip 1   ??? budesonide-formoterol (SYMBICORT) 160-4.5 mcg/actuation inhaler Inhale 2 puffs Two (2) times a day. 3 Inhaler 2   ??? cyclobenzaprine (FLEXERIL) 10 MG tablet Take 1 tablet by mouth three times daily as needed 90 tablet  0   ??? empty container Misc USE AS DIRECTED 1 each 2 ??? EPINEPHrine (EPIPEN 2-PAK) 0.3 mg/0.3 mL injection Inject 0.3 mL (0.3 mg total) into the muscle once for 1 dose. 1 Device 1   ??? gabapentin (NEURONTIN) 300 MG capsule Take 1 capsule (300 mg total) by mouth Three (3) times a day. 270 capsule 1   ??? hydroCHLOROthiazide (HYDRODIURIL) 25 MG tablet Take 1 tablet (25 mg total) by mouth daily. TAKE ONE (1) TABLET BY MOUTH EVERY DAY 90 tablet 1   ??? insulin ASPART (NOVOLOG U-100 INSULIN ASPART) 100 unit/mL injection Take 1-20 units SQ QID AC and HS as directed 80 mL 1   ??? insulin glargine (BASAGLAR, LANTUS) 100 unit/mL (3 mL) injection pen 18 units 30 pen 3   ??? ipratropium (ATROVENT) 0.02 % nebulizer solution Inhale 2.5 mL (500 mcg total) by nebulization Four (4) times a day. 300 mL 3   ??? MEDICAL SUPPLY ITEM Accucheck Glide Glocumeter for home glucose checks. Check blood glucose ACHS. 1 Package 0   ??? mepolizumab 100 mg/mL AtIn Inject 100 mg under the skin every twenty-eight (28) days. 1 mL 12   ??? metFORMIN (GLUCOPHAGE) 1000 MG tablet Take 1 tablet (1,000 mg total) by mouth 2 (two) times a day with meals. 180 tablet 1   ??? miscellaneous medical supply Misc Nebulizer machine and tubing. Patients machine is 55 years old. 1 each 0   ??? OXYGEN-AIR DELIVERY SYSTEMS MISC Dose: 2-3 LPM     ??? posaconazole (NOXAFIL) 100 mg TbEC delayed released tablet Take 200 mg by mouth daily. 60 tablet 2   ??? pravastatin (PRAVACHOL) 40 MG tablet Take 1 tablet (40 mg total) by mouth daily with evening meal. 90 tablet 1   ??? predniSONE (DELTASONE) 5 MG tablet Take 5 mg by mouth daily.     ??? sodium chloride 7% 7 % Nebu Inhale 1 vial (4 mL) by nebulization Two (2) times a day. 720 mL 3     No current facility-administered medications for this visit.        Health Maintenance:     Health Maintenance Summary w/Most Recent Date       Status Date      HPV Cotest with Pap Smear (21-65) Overdue 06/15/1984     Pap Smear with Cotest HPV (21-65) Overdue 02/27/2001 Done 02/28/1996 Registry Metric: Pap Smear date     Done 02/28/1996 PAP SMEAR (HISTORICAL RESULT)    Zoster Vaccines Overdue 06/15/2013     DTaP/Tdap/Td Vaccines Overdue 07/07/2017      Done 07/08/2007 Imm Admin: TdaP    FOBT/FIT Overdue 07/18/2017      Done 07/18/2016 POCT OCCULT BLOOD, STOOL 1-3    Hemoglobin A1c Next Due 02/01/2019      Done 11/01/2018 Registry Metric: Last Hemoglobin A1c Date     Done 11/01/2018 HEMOGLOBIN A1C Hemoglobin A1C           Done 03/05/2018 HEMOGLOBIN A1C Hemoglobin A1C           Done 11/05/2017 HEMOGLOBIN A1C Hemoglobin A1C           Done 07/03/2017 POCT GLYCOSYLATED HEMOGLOBIN (HGB A1C) HGB A1C, RAP/PDS           Patient has more history with this topic...    Serum Creatinine Monitoring Next Due 08/28/2019      Done 08/28/2018 Registry Metric: Serum creatinine     Done 08/02/2016 Ext Proc: CHG BASIC METABOLIC PANEL CALCIUM TOTAL     Done  08/28/2018 COMPREHENSIVE METABOLIC PANEL Creatinine           Done 05/22/2018 COMPREHENSIVE METABOLIC PANEL Creatinine           Done 04/25/2018 COMPREHENSIVE METABOLIC PANEL Creatinine           Patient has more history with this topic...    Potassium Monitoring Next Due 08/28/2019      Done 08/28/2018 Registry Metric: Potassium     Done 08/02/2016 Ext Proc: CHG BASIC METABOLIC PANEL CALCIUM TOTAL     Done 08/28/2018 COMPREHENSIVE METABOLIC PANEL Potassium           Done 05/22/2018 COMPREHENSIVE METABOLIC PANEL Potassium           Done 04/25/2018 COMPREHENSIVE METABOLIC PANEL Potassium           Patient has more history with this topic...    Foot Exam Next Due 11/01/2019      Done 11/01/2018 HM DIABETES FOOT EXAM     Done 11/05/2017 HM DIABETES FOOT EXAM     Done 06/14/2016 HM DIABETES FOOT EXAM     Done 11/05/2012 HM DIABETES FOOT EXAM    Urine Albumin/Creatinine Ratio Next Due 11/01/2019      Done 11/01/2018 ALBUMIN / CREATININE URINE RATIO Albumin/Creatinine Ratio           Done 11/05/2017 ALBUMIN / CREATININE URINE RATIO Albumin/Creatinine Ratio Done 06/14/2016 ALBUMIN / CREATININE URINE RATIO Albumin/Creatinine Ratio          Retinal Eye Exam Next Due 11/15/2019      Done 11/15/2018 HM DIABETES EYE EXAM HM Diabetic Eye Exam           Done 04/12/2016 HM DIABETES EYE EXAM HM Diabetic Eye Exam           Done 06/18/2014 SmartData: OPHTH FUNDUS OD PERIPHERY     Done 06/18/2014 SmartData: OPHTH FUNDUS OS PERIPHERY     Done 06/18/2014 SmartData: FINDINGS - PE - EYES - FUNDUSCOPIC - PERIPHERY - LEFT PERIPHERY NORMAL    Mammogram Start Age 63 Next Due 02/29/2020      Done 02/28/2018 MAMMO SCREENING BILATERAL     Done 03/16/2015 MAMMO SCREENING BILATERAL    COPD Spirometry Next Due 01/04/2022      Done 01/04/2017 FLOW VOLUME LOOP     Done 12/16/2015 SPIROMETRY PRE / POST     Done 11/20/2014 FLOW VOLUME LOOP     Done 06/25/2013 SPIROMETRY    Hepatitis C Screen This plan is no longer active.      Done 11/24/2014 HEPATITIS C ANTIBODY Hepatitis C Ab           Done 11/18/2014 HEPATITIS C RNA, QUANTITATIVE, PCR     Done 11/17/2014 HEPATITIS C ANTIBODY Hepatitis C Ab           Done 11/02/2014 HEPATITIS C RNA, QUANTITATIVE, PCR     Done 10/28/2014 HEPATITIS C ANTIBODY Hepatitis C Ab          Pneumococcal Vaccine This plan is no longer active.      Done 01/03/2017 Imm Admin: PNEUMOCOCCAL POLYSACCHARIDE 23     Done 07/08/2007 Imm Admin: PNEUMOCOCCAL POLYSACCHARIDE 23    Influenza Vaccine This plan is no longer active.      Done 01/08/2019 Imm Admin: Influenza Vaccine Quad (IIV4 PF) 41mo+ injectable     Done 12/06/2017 Imm Admin: Influenza Vaccine Quad (IIV4 PF) 2mo+ injectable     Done 01/03/2017 Imm Admin: Influenza Vaccine Quad (IIV4 PF) 45mo+ injectable  Done 12/16/2015 Imm Admin: Influenza Vaccine Quad (IIV4 PF) 70mo+ injectable     Done 12/19/2014 Imm Admin: Influenza Vaccine Quad (IIV4 PF) 9mo+ injectable     Patient has more history with this topic...          Immunizations:     Immunization History   Administered Date(s) Administered ??? INFLUENZA TIV (TRI) PF (IM) 01/02/2005, 12/14/2008   ??? Influenza Vaccine Quad (IIV4 PF) 44mo+ injectable 11/28/2012, 12/19/2014, 12/16/2015, 01/03/2017, 12/06/2017, 01/08/2019   ??? PNEUMOCOCCAL POLYSACCHARIDE 23 07/08/2007, 01/03/2017   ??? PPD Test 01/19/2015   ??? TdaP 07/08/2007       I have reviewed and (if needed) updated the patient's problem list, medications, allergies, past medical and surgical history, social and family history.    ROS:      ROS  Comprehensive 10 point ROS negative unless otherwise stated in the HPI.       Vital Signs:     Wt Readings from Last 3 Encounters:   01/08/19 (!) 103.4 kg (228 lb)   11/18/18 95.3 kg (210 lb)   11/01/18 99.8 kg (220 lb)     Temp Readings from Last 3 Encounters:   01/08/19 36.8 ??C (98.2 ??F) (Oral)   11/01/18 36.1 ??C (97 ??F) (Temporal)   06/25/18 36.7 ??C (98.1 ??F) (Oral)     BP Readings from Last 3 Encounters:   01/08/19 124/82   11/01/18 110/80   06/25/18 152/83     Pulse Readings from Last 3 Encounters:   01/08/19 80   11/01/18 93   06/25/18 94     Estimated body mass index is 31.81 kg/m?? as calculated from the following:    Height as of this encounter: 180.3 cm (5' 10.98).    Weight as of this encounter: 103.4 kg (228 lb).  Facility age limit for growth percentiles is 20 years.        Objective:      General: Alert and oriented x3. Well-appearing. No acute distress.   HEENT:  Normocephalic.  Atraumatic. Conjunctiva and sclera normal. OP MMM without lesions.   Neck:  Supple. No thyroid enlargement. No adenopathy.   Heart:  Regular rate and rhythm . Normal S1, S2.  No murmurs, rubs or gallops.   Lungs:  No respiratory distress.  Lungs clear to auscultation. No wheezes, rhonchi, or rales. Wearing portable oxygen.  GI/GU:  Soft, +BS, nondistended, non-TTP. No palpable masses or organomegaly.   Extremities:  No edema. Peripheral pulses normal.   Skin:  Warm, dry. No rash or lesions present.   Neuro:  Non-focal. No obvious weakness. Psych:  Affect normal, eye contact good, speech clear and coherent.       HPI obtained and examination performed by Brooke Bonito, Nurse Practitioner Student. I was present during the visit, participating in the key components of the service and supervising the time spent by the NP student in work on the day of the clinic visit.    Noralyn Pick, FNP    I attest that I, Bayard Hugger, personally documented this note while acting as scribe for Noralyn Pick, FNP.      Bayard Hugger, Scribe.  01/08/2019     The documentation recorded by the scribe accurately reflects the service I personally performed and the decisions made by me.    Noralyn Pick, FNP

## 2019-01-09 ENCOUNTER — Ambulatory Visit: Admit: 2019-01-09 | Discharge: 2019-01-10 | Payer: MEDICARE

## 2019-01-09 LAB — HCV RNA COMMENT: Lab: 0

## 2019-01-09 LAB — HEPATITIS C RNA, QUANTITATIVE, PCR: HCV RNA: NOT DETECTED

## 2019-01-09 MED FILL — SODIUM CHLORIDE 7 % FOR NEBULIZATION: RESPIRATORY_TRACT | 90 days supply | Qty: 720 | Fill #1

## 2019-01-09 MED FILL — NUCALA 100 MG/ML SUBCUTANEOUS AUTO-INJECTOR: SUBCUTANEOUS | 28 days supply | Qty: 1 | Fill #6

## 2019-01-09 MED FILL — SODIUM CHLORIDE 7 % FOR NEBULIZATION: 90 days supply | Qty: 720 | Fill #1 | Status: AC

## 2019-01-09 MED FILL — NUCALA 100 MG/ML SUBCUTANEOUS AUTO-INJECTOR: 28 days supply | Qty: 1 | Fill #6 | Status: AC

## 2019-01-09 NOTE — Unmapped (Signed)
Tried to contact patient but she didn't answer the phone. Left her a voice message.

## 2019-01-09 NOTE — Unmapped (Signed)
Pt was seen in clinic today for PFT's. After completed pt had concerns about her breathing. She has been having more issues with being able to walk around without SOB. She feels that at this time the infection in her lungs is getting worse. During her walk test she was told to turn her oxygen up from 2L to 5L and still finds that she is SOB. When up and walking around at home doing her ADL's she states that her oxygen has dropped to the 60's on 5L. She states that she sits down and is able to recover within a couple of minutes. I instructed her to go to the [facility] ED if her breathing continues to get worse since that is the closest facility to her.     She also stated that Dr. Ples Specter her infectious disease doctor has been trying to get in touch with Dr. Wellington Hampshire and has been unsuccessful. She states that she would like for Al-Qadi to discuss care with her ID doctor.     I informed her that I would pass this information along to Dr. Wellington Hampshire and would get in touch with her with any updated information.     She currently has an appointment on 11/6 at 8am.

## 2019-01-10 DIAGNOSIS — R0609 Other forms of dyspnea: Principal | ICD-10-CM

## 2019-01-10 DIAGNOSIS — R0602 Shortness of breath: Principal | ICD-10-CM

## 2019-01-10 MED ORDER — PREDNISONE 5 MG TABLET: tablet | 0 refills | 40 days | Status: AC

## 2019-01-10 NOTE — Unmapped (Signed)
Received MyChart message from Ms. Rickels re: not feeling well despite treatment with antibiotics. Still requiring 5L O2 and sats will drop with any activity. Discussed with Dr. Wellington Hampshire and we will prescribe a steroid burst. She has pulm f/u on 11/6 to discuss next steps for treatment of sarcoidosis - either further immunosuppression v a clinical trial. She knows to go to the ED if she develops worsening symptoms despite steroids and will contact me on Monday for a clinic appt for sputum cx +/- chest imaging if symptoms are stable but not improved with steroids.    Duanne Limerick, MD, MHS  Clinical Instructor, [place] Division of Infectious Diseases  Pager: [phone]

## 2019-01-14 MED ORDER — INSULIN GLARGINE (U-100) 100 UNIT/ML (3 ML) SUBCUTANEOUS PEN
PEN_INJECTOR | 3 refills | 0 days | Status: CP
Start: 2019-01-14 — End: ?

## 2019-01-20 DIAGNOSIS — B441 Other pulmonary aspergillosis: Principal | ICD-10-CM

## 2019-01-20 DIAGNOSIS — D869 Sarcoidosis, unspecified: Principal | ICD-10-CM

## 2019-01-20 MED ORDER — ALBUTEROL SULFATE HFA 90 MCG/ACTUATION AEROSOL INHALER
RESPIRATORY_TRACT | 11 refills | 0 days | Status: CP | PRN
Start: 2019-01-20 — End: 2020-01-20

## 2019-01-20 MED ORDER — POSACONAZOLE 100 MG TABLET,DELAYED RELEASE
ORAL_TABLET | Freq: Every day | ORAL | 2 refills | 30 days | Status: CP
Start: 2019-01-20 — End: ?

## 2019-01-23 DIAGNOSIS — Z1211 Encounter for screening for malignant neoplasm of colon: Principal | ICD-10-CM

## 2019-01-23 NOTE — Unmapped (Signed)
Pt would like to do the cologuard is this appopriate?  Please advise?

## 2019-01-23 NOTE — Unmapped (Signed)
Yes, this would be appropriate colon cancer screening for her.

## 2019-01-23 NOTE — Unmapped (Signed)
Ordered has been faxed over to [facility].

## 2019-01-24 ENCOUNTER — Ambulatory Visit: Admit: 2019-01-24 | Discharge: 2019-01-25 | Payer: MEDICARE | Attending: Pulmonary Disease | Primary: Pulmonary Disease

## 2019-01-24 DIAGNOSIS — D860 Sarcoidosis of lung: Principal | ICD-10-CM

## 2019-01-24 DIAGNOSIS — J479 Bronchiectasis, uncomplicated: Principal | ICD-10-CM

## 2019-01-24 DIAGNOSIS — B440 Invasive pulmonary aspergillosis: Principal | ICD-10-CM

## 2019-01-24 NOTE — Unmapped (Addendum)
[facility] Refill Coordination Note    Specialty Medication(s) to be Shipped:   CF/Pulmonary: -Nucala 100mg /ml     Karen Barber, DOB: 11/25/1965  Phone: [phone] (home) [phone] (work)    All above HIPAA information was verified with patient.     Completed refill call assessment today to schedule patient's medication shipment from the [facility] [phone]).       Specialty medication(s) and dose(s) confirmed: Regimen is correct and unchanged.   Changes to medications: [name] reports no changes at this time.  Changes to insurance: No  Questions for the pharmacist: No    Confirmed patient received Welcome Packet with first shipment. The patient will receive a drug information handout for each medication shipped and additional FDA Medication Guides as required.       DISEASE/MEDICATION-SPECIFIC INFORMATION        For CF patients: CF [facility] Active? No-not enrolled. Next Injection is scheduled for 02/13/19    SPECIALTY MEDICATION ADHERENCE     Medication Adherence    Patient reported X missed doses in the last month: 0  Specialty Medication: Nucala 100mg /ml  Patient is on additional specialty medications: No  Informant: patient  Reliability of informant: reliable        Nucala 100mg /ml : 14 days of medicine on hand     SHIPPING     Shipping address confirmed in Epic.     Delivery Scheduled: Yes, Expected medication delivery date: 02/06/2019.     Medication will be delivered via Same Day Courier to the home address in Epic [place].    Keysean Savino P Allena Katz   [facility] Specialty Technician

## 2019-01-24 NOTE — Unmapped (Signed)
Thank you for allowing me to be a part of your care.  Please call the clinic with any questions.  ??  Christen Butter, MD  Pulmonary and Critical Care Medicine  [address]  [id number]  [place], [place] [address]  ??  Thank you for your visit to the [facility].   ??  Between appointments, you can reach [place] at these numbers:  ??  For appointments or the Pulmonary Nurse: [phone]  Fax: [phone]  ??  For urgent issues after hours:  Hospital Operator: [phone], ask for Pulmonary Fellow on call

## 2019-01-24 NOTE — Unmapped (Signed)
RETURN PULMONARY CLINIC VISIT:    Patient: Karen Barber([birth year])  Reason for visit: Hospital discharge follow up; pulmonary sarcoidosis    HISTORY OF PRESENT ILLNESS:     Karen Barber is a pleasant 53 y.o. African-American woman with history of sarcoidosis, it was diagnosed by lip biopsy in 1995. She has been on 2 lpm [place] continuous oxygen for over 10 years now.  She also has a history of Aspergillus pneumonia and she was treated with voriconazole and left upper lobectromy in 11/2014.  She is currently not on any immunomodulating agents.      Recently admitted to [facility] for acute on chronic hypoxic respiratory failure and increasing dyspnea. Discharged on 02/18/18. Patient needed up to 5 LPM with exertion and 3 LPM at rest from her usual baseline of 2-3 LPM. CTA at admission showed increasing lymphadenopathy, but no PE or increasing parenchymal changes. Echo showed normal RV and LV function. She was treated with Augmentin, airway clearance and eventually the addition of Prednisone 60 mg daily, tapered to 40 mg daily at discharge. ID was consulted given her history of chronic invasive aspergillosis and recent NTM AFB growth from sputum of M. Fortuitum. Repeat AFB cultures from hospitalization have shown no growth to date. She has since seen her primary ID physician who changed her from Voriconazole to Posaconazole. At her last visit with me in 02/2018, she was referred for consideration of lung transplant, however, was deemed not a candidate due to anatomic abnormalities that would not allow the surgery to be performed. Follow up PFTs were also ordered to monitor with her Prednisone taper, however, these were never completed.    She reports increasing shortness of breath when weaning off of her prednisone.       REVIEW OF SYSTEMS: A comprehensive ROS was performed was reviewed and found to be negative except as above in the HPI.      INTERVAL HPI 01/24/19  She had recent worsening cough and shortness of breath from baseline. She refused to go to ED for assessment. Treated with abx (Augmentin) and aggressive airway clearance with no significant improvement. Then started on prednisone 40 mg for 1 week with quick taper (now on 10 mg daily) with some improvement. Cough and exertional dyspnea stable today. She is currently on 4 L O2 but asked to increase it to 6L based on most recent . She is adherent to airway clearance (albuterol/HTS 7% and acapella).    No eye, skin, or joint symptoms. Also denies palpitation, chest pain, lightheadedness or syncope.     Echocardiography from 2 weeks ago with grade I diastolic dysfunction and normal RV size and function.    PET scan with some activity in the mediastinal LNs and parenchyma.     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  She is a lifelong nonsmoker.    MEDICATIONS AND ALLERGIES:     CURRENT MEDICATIONS:  Outpatient Encounter Medications as of 01/24/2019   Medication Sig Dispense Refill   ??? ACCU-CHEK FASTCLIX LANCET DRUM Misc USE TO CHECK GLUCOSE  4 TIMES DAILY BEFORE MEAL(S) AND NIGHTLY     ??? ACCU-CHEK GUIDE GLUCOSE METER Misc 1 Device by Other route Four (4) times a day (before meals and nightly). AS DIRECTED 1 kit 0   ??? albuterol 2.5 mg /3 mL (0.083 %) nebulizer solution Inhale 3 mL (2.5 mg total) by nebulization every six (6) hours as needed for wheezing or shortness of breath (airway clearance/cough). 120 vial 11   ??? albuterol HFA 90 mcg/actuation inhaler Inhale 2 puffs every four (4) hours as needed for wheezing or shortness of breath. 2 Inhaler 11   ??? blood sugar diagnostic Strp by Other route Four (4) times a day (before meals and nightly). 400 strip 1   ??? budesonide-formoterol (SYMBICORT) 160-4.5 mcg/actuation inhaler Inhale 2 puffs Two (2) times a day. 3 Inhaler 2   ??? cyclobenzaprine (FLEXERIL) 10 MG tablet Take 1 tablet by mouth three times daily as needed 90 tablet 0   ??? empty container Misc USE AS DIRECTED 1 each 2   ??? EPINEPHrine (EPIPEN 2-PAK) 0.3 mg/0.3 mL injection Inject 0.3 mL (0.3 mg total) into the muscle once for 1 dose. 1 Device 1   ??? gabapentin (NEURONTIN) 300 MG capsule Take 1 capsule (300 mg total) by mouth Three (3) times a day. 270 capsule 1   ??? hydroCHLOROthiazide (HYDRODIURIL) 25 MG tablet Take 1 tablet (25 mg total) by mouth daily. TAKE ONE (1) TABLET BY MOUTH EVERY DAY 90 tablet 1   ??? insulin ASPART (NOVOLOG U-100 INSULIN ASPART) 100 unit/mL injection Take 1-20 units SQ QID AC and HS as directed 80 mL 1   ??? insulin glargine (BASAGLAR, LANTUS) 100 unit/mL (3 mL) injection pen 18 units 30 pen 3   ??? ipratropium (ATROVENT) 0.02 % nebulizer solution Inhale 2.5 mL (500 mcg total) by nebulization Four (4) times a day. 300 mL 3   ??? MEDICAL SUPPLY ITEM Accucheck Glide Glocumeter for home glucose checks. Check blood glucose ACHS. 1 Package 0   ??? mepolizumab 100 mg/mL AtIn Inject 100 mg under the skin every twenty-eight (28) days. 1 mL 12   ??? metFORMIN (GLUCOPHAGE) 1000 MG tablet Take 1 tablet (1,000 mg total) by mouth 2 (two) times a day with meals. 180 tablet 1   ??? miscellaneous medical supply Misc Nebulizer machine and tubing. Patients machine is 55 years old. 1 each 0   ??? OXYGEN-AIR DELIVERY SYSTEMS MISC Dose: 2-3 LPM     ??? posaconazole (NOXAFIL) 100 mg TbEC delayed released tablet Take 200 mg by mouth daily. 60 tablet 2   ??? pravastatin (PRAVACHOL) 40 MG tablet Take 1 tablet (40 mg total) by mouth daily with evening meal. 90 tablet 1   ??? predniSONE (DELTASONE) 5 MG tablet Take 5 mg by mouth daily.     ??? predniSONE (DELTASONE) 5 MG tablet Take 4 tablets (20 mg total) by mouth daily for 5 days, THEN 2 tablets (10 mg total) daily for 5 days, THEN 1 tablet (5 mg total) daily. 60 tablet 0   ??? sodium chloride 7% 7 % Nebu Inhale 1 vial (4 mL) by nebulization Two (2) times a day. 720 mL 3     No facility-administered encounter medications on file as of 01/24/2019.        ALLERGIES:  Allergies as of 01/24/2019   ??? (No Known Allergies)       PHYSICAL EXAM:         ANCILLARY DATA:     Vitals:    01/24/19 0757  BP: 152/107   Pulse: 106   Resp: 18   Temp: 36.6 ??C (97.9 ??F)   SpO2: 98%   General appearance - comfortable, in no respiratory distress  Psych-awake, alert, and oriented X3  Eyes - Sclera anicteric, conjunctiva pink  Mouth - moist  Neck/EENT - Trachea supple and midline, (-) JVD   Lymphatics/Hem - No enlarged LNs  CV- Normal S1 and S2, no murmur or gallop  Resp- good air movement bilaterally  GI- soft, non-tender abdomen, no organomegaly  MSK-- No pedal edema, (+) clubbing   Skin - no rash  Neuro-non-focal        All imaging and labs were reviewed personally.      Echo 12/30/18:  1. The left ventricle is normal in size with mildly increased wall  thickness.    2. Normal left ventricular systolic function, ejection fraction 60-65%.    3. Diastolic dysfunction - grade I (normal filling pressures).    4. Aortic sclerosis.    5. Normal right ventricular size and systolic function.    6. Pulmonary systolic pressure cannot be estimated due to insufficient TR  jet.    PET scan 12/09/18  - Metabolically active fibrosis and mediastinal and supraclavicular nodes likely reflect active disease; overall level of avidity is moderate, with uptake similar to liver. Appearance of CT is mildly improved from prior, although substantial fibrosis remains.    CTA PE Protocol 02/13/18:  No pulmonary embolism or acute airspace disease.    Since prior study dated 09/26/2016, there has been progressive mediastinal lymphadenopathy, and pulmonary fibrosis, related to end-stage sarcoidosis.   ??  Dilated main pulmonary artery  as can be seen with pulmonary hypertension.      EKG 02/13/18  SINUS TACHYCARDIA WITH 1ST DEGREE AV BLOCK  PULMONARY DISEASE PATTERN  LEFT ANTERIOR FASCICULAR BLOCK  NONSPECIFIC T WAVE ABNORMALITY  ABNORMAL ECG  WHEN COMPARED WITH ECG OF 14-Dec-2017 13:05,  PREMATURE VENTRICULAR BEATS ARE NO LONGER PRESENT  QRS AXIS SHIFTED LEFT  Confirmed by Warnell Forester ([id number]) on 02/13/2018 10:03:18 PM    LFTs  Lab Results   Component Value Date    ALKPHOS 73 08/28/2018    BILITOT 0.4 08/28/2018    BILIDIR <0.10 12/14/2017    PROT 7.8 08/28/2018    ALBUMIN 4.0 08/28/2018    ALT 11 08/28/2018    AST 15 08/28/2018      Calcium   Date Value Ref Range Status   08/28/2018 9.7 8.5 - 10.2 mg/dL Final           ASSESSMENT AND PLAN:     Karen Barber is a pleasant 53 y.o. African-American woman with history of sarcoidosis with obstructive and restrictive lung disease and aspergillus s/p LUL lobectomy with recurrence in 08/2016, now on chronic posaconazole whom we are seeing virtually for follow-up management of the same. Currently, on prednisone 5 mg since her last hospitalization 02/13/18-02/18/18.    Her symptoms improved previously with Augmentin, airway clearance and prednisone. CTA negative for PE but increased lymphadenopathy and potentially worsening parenchymal disease from prior although interpretation is limited by artifact and PE protocol study. Echo with normal cardiac function and no evidence of elevated PASP or right heart disease.  Having increasing symptoms of dyspnea on exertion as well as cough since coming off of her prednisone in January.  Etiology of her symptoms is likely active pulmonary sarcoidosis given prior CT findings and PET scan. Other potential causes include infection or possibly related to her underlying reactive  airway disease.  Pulmonary hypertension is also a consideration with a potential pulmonary artery stenosis from prior PA plasty during her left upper lobectomy in 2016.     NTM AFB from prior sputum grew M. Fortuitum. This has grown twice previously from expectorated sputum. Unlikely to represent pathogen currently given that current symptoms improved with Prednisone and abx. Repeat AFB from recently hospitalization remain NGTD although are not finalized currently.     I suspect bronchiectasis has a central role in her symptoms especially that she was not using any airway clearance measure until a couple of weeks ago. Improvement previously with airway clearance and abx (Augmentin) further supports this (ie, suggests contribution from bacterial infection). We discussed at length the importance of using albuterol, HTS, and her airway clearance device.    I encouraged her to continue aggressive airway clearance. I doubt asthma is contributing given lack of benefit with Nucala. Also, not sure how much Symbicort is adding while on albuterol neb q6 hrs and systemic prednisone. However, this will be important if we start weaning her off systemic corticosteroids again.     Her pulmonary sarcoidosis is challenging. There is evidence of a ctive parenchymal sarcoidosis on most recent PET scan. However, intensifying immunosuppressive therapy will be challenging given active infection (invasive aspergillosis). There will be a risk of activating mycobacterial disease as well. If we decide to increase her steroids or add a steroid-sparing therapy, this will have to be well coordinated with our ID colleagues. I discussed enrolling her in the CLEAR therapy trial at [facility] (anti-mycobacterial therapy) as there is some evidence that treating mycobacterial Ags may restore T cell function and improve sarcoidosis.    Finally, no evidence of pulmonary vascular compression by hilar and mediastinal LNs that could be intervened on with stenting.      PLAN:    Pulmonary sarcoidosis complicated by chronic hypoxemic and hypercapnic respiratory failure:  -- Continue supplemental oxygen, asked to increase to 6 L based on .  We will work to obtain portable oxygen concentrator versus portable noninvasive oxygen supplementation and positive airway pressure.  -- Discussed CLEAR therapy, she will get back to [place] next week  -- Annual spirometry, DLCO   -- Annual ophthalmology and EKG (if abnormal EKG or cardiac symptoms, will proceed to cardiac MRI)  -- She has been referred for evaluation of pulmonary transplant in the past however is not a candidate due to prior PA plasty which precludes her ability to undergo the surgery for lung transplantation her thoracic surgery  -- Continue Life 2000 home ventilator.      Bronchiectasis:  -- Encouraged to use of airway clearance device, aiming for twice daily and can pre-treat with Albuterol to improve clearance  -- Continue  albuterol, ipratropium, and hypertonic saline 7% twice daily for airway clearance    Chronic Invasive Aspergillosis: s/p LUL lobectomy. Recurrence in 08/2016  -- Currently on posaconazole (changed in January 2020 from voriconazole per ID recommendations).   -- Continue to follow with ID.  -- Fungal sputum culture, lower respiratory culture and AFB today    Asthma:  -- Continue Nucala and Symbicort inh    NTM AFB from prior sputum (M. Fortuitum), likely represents colonization/contamination. Has not grown on most recent AFB cultures. No indication for treatment currently.  --Repeat AFB sputum culture every visit    OSA symptoms:  -- Sleep study     Health maintenance:  -- Vaccines: up-to-date  -- Smoking cessation: N/A (lifelong non-smoker)  -- Lung cancer  screening: N/A      RTC as previously scheduled in 3 months     Iley Deignan O. Wellington Hampshire, MD   Assistant Professor of Medicine  Pulmonary and Critical Care Medicine  [facility]

## 2019-02-06 MED FILL — NUCALA 100 MG/ML SUBCUTANEOUS AUTO-INJECTOR: SUBCUTANEOUS | 28 days supply | Qty: 1 | Fill #7

## 2019-02-06 MED FILL — NUCALA 100 MG/ML SUBCUTANEOUS AUTO-INJECTOR: 28 days supply | Qty: 1 | Fill #7 | Status: AC

## 2019-02-21 NOTE — Unmapped (Signed)
[facility] Refill Coordination Note    Specialty Medication(s) to be Shipped:   CF/Pulmonary: -Nucala 100mg /ml     Karen Barber, DOB: 03-Feb-1966  Phone: [phone] (home) [phone] (work)    All above HIPAA information was verified with patient.     Was a Nurse, learning disability used for this call? No    Completed refill call assessment today to schedule patient's medication shipment from the [facility] [phone]).       Specialty medication(s) and dose(s) confirmed: Regimen is correct and unchanged.   Changes to medications: Karen Barber reports no changes at this time.  Changes to insurance: No  Questions for the pharmacist: No    Confirmed patient received Welcome Packet with first shipment. The patient will receive a drug information handout for each medication shipped and additional FDA Medication Guides as required.       DISEASE/MEDICATION-SPECIFIC INFORMATION        For CF patients: CF [employer] [facility] Active? No-not enrolled    SPECIALTY MEDICATION ADHERENCE     Medication Adherence    Patient reported X missed doses in the last month: 0  Specialty Medication: Nucala 100mg /ml  Patient is on additional specialty medications: No  Informant: patient  Reliability of informant: reliable       Nucala 100mg /ml : 0 days of medicine on hand (Next injection due Dec 27th, 2020)    [facility]     Shipping address confirmed in Epic.     Delivery Scheduled: Yes, Expected medication delivery date: 03/05/2019.     Medication will be delivered via Same Day Courier to the prescription address in Epic WAM.    Tonnya Garbett P Allena Katz   [facility] Specialty Technician

## 2019-03-05 MED FILL — NUCALA 100 MG/ML SUBCUTANEOUS AUTO-INJECTOR: 28 days supply | Qty: 1 | Fill #8 | Status: AC

## 2019-03-05 MED FILL — NUCALA 100 MG/ML SUBCUTANEOUS AUTO-INJECTOR: SUBCUTANEOUS | 28 days supply | Qty: 1 | Fill #8

## 2019-03-24 NOTE — Unmapped (Signed)
[facility] Refill Coordination Note    Specialty Medication(s) to be Shipped:   CF/Pulmonary: -Nucala 100mg /ml  Other medication(s) to be shipped: Empty container & Sodium Chloride 7%     Karen Barber, DOB: 1965-05-15  Phone: [phone] (home) [phone] (work)    All above HIPAA information was verified with patient.     Was a Nurse, learning disability used for this call? No    Completed refill call assessment today to schedule patient's medication shipment from the [facility] [phone]).       Specialty medication(s) and dose(s) confirmed: Regimen is correct and unchanged.   Changes to medications: Karen Barber reports no changes at this time.  Changes to insurance: No  Questions for the pharmacist: No    Confirmed patient received Welcome Packet with first shipment. The patient will receive a drug information handout for each medication shipped and additional FDA Medication Guides as required.       DISEASE/MEDICATION-SPECIFIC INFORMATION        For CF patients: CF [employer] Active? No-not enrolled    SPECIALTY MEDICATION ADHERENCE     Medication Adherence    Patient reported X missed doses in the last month: 0  Specialty Medication: Nucala 100mg /ml  Patient is on additional specialty medications: No  Informant: patient  Reliability of informant: reliable        Nucala 100mg /ml : 0 days of medicine on hand (Next injection is due on the 28th of Jan    SHIPPING     Shipping address confirmed in Epic.     Delivery Scheduled: Yes, Expected medication delivery date: 04/07/2019.     Medication will be delivered via Same Day Courier to the prescription address in Epic WAM.    Karen Barber Allena Katz   [facility] Specialty Technician

## 2019-03-26 NOTE — Unmapped (Signed)
Faxed back requested notes to [facility] concerning the Life 2000.

## 2019-03-28 DIAGNOSIS — B441 Other pulmonary aspergillosis: Principal | ICD-10-CM

## 2019-03-28 DIAGNOSIS — I10 Essential (primary) hypertension: Principal | ICD-10-CM

## 2019-03-28 MED ORDER — METFORMIN 1,000 MG TABLET
ORAL_TABLET | Freq: Two times a day (BID) | ORAL | 1 refills | 90 days | Status: CP
Start: 2019-03-28 — End: 2020-03-27

## 2019-03-28 MED ORDER — POSACONAZOLE 100 MG TABLET,DELAYED RELEASE
ORAL_TABLET | Freq: Every day | ORAL | 2 refills | 30 days | Status: CP
Start: 2019-03-28 — End: ?

## 2019-03-28 MED ORDER — HYDROCHLOROTHIAZIDE 25 MG TABLET
ORAL_TABLET | Freq: Every day | ORAL | 1 refills | 90 days | Status: CP
Start: 2019-03-28 — End: 2019-04-27

## 2019-03-28 MED ORDER — CYCLOBENZAPRINE 10 MG TABLET
ORAL_TABLET | Freq: Three times a day (TID) | ORAL | 0 refills | 30 days | Status: CP | PRN
Start: 2019-03-28 — End: 2020-03-27

## 2019-03-28 MED ORDER — GABAPENTIN 300 MG CAPSULE
ORAL_CAPSULE | Freq: Three times a day (TID) | ORAL | 1 refills | 90.00000 days | Status: CP
Start: 2019-03-28 — End: 2020-03-28

## 2019-03-28 NOTE — Unmapped (Signed)
P/C requesting refill on cyclobenzaprine , last ordered on [id number], last visit 01/08/2019

## 2019-03-28 NOTE — Unmapped (Signed)
RETURN PULMONARY CLINIC VISIT:    Patient: Karen Barber(1965-08-02)  Reason for visit: Hospital discharge follow up; pulmonary sarcoidosis    HISTORY OF PRESENT ILLNESS:     Karen Barber is a pleasant 54 y.o. African-American woman with history of sarcoidosis, it was diagnosed by lip biopsy in 1995. She has been on 2 lpm [place] continuous oxygen for over 10 years now.  She also has a history of Aspergillus pneumonia and she was treated with voriconazole and left upper lobectromy in 11/2014.  She is currently not on any immunomodulating agents.      Recently admitted to [facility] for acute on chronic hypoxic respiratory failure and increasing dyspnea. Discharged on 02/18/18. Patient needed up to 5 LPM with exertion and 3 LPM at rest from her usual baseline of 2-3 LPM. CTA at admission showed increasing lymphadenopathy, but no PE or increasing parenchymal changes. Echo showed normal RV and LV function. She was treated with Augmentin, airway clearance and eventually the addition of Prednisone 60 mg daily, tapered to 40 mg daily at discharge. ID was consulted given her history of chronic invasive aspergillosis and recent NTM AFB growth from sputum of M. Fortuitum. Repeat AFB cultures from hospitalization have shown no growth to date. She has since seen her primary ID physician who changed her from Voriconazole to Posaconazole. At her last visit with me in 02/2018, she was referred for consideration of lung transplant, however, was deemed not a candidate due to anatomic abnormalities that would not allow the surgery to be performed. Follow up PFTs were also ordered to monitor with her Prednisone taper, however, these were never completed.    She reports increasing shortness of breath when weaning off of her prednisone.       REVIEW OF SYSTEMS: A comprehensive ROS was performed was reviewed and found to be negative except as above in the HPI.      INTERVAL HPI 03/28/19  Since last visit, patient feels about the same from breathing standpoint.  She continues to have shortness of breath with exertion.  She is only able to walk less than 100 feet before she has to stop to catch her breath.  Denies orthopnea, lower extremity swelling, dizziness lightheadedness or syncope.  She continues to report easy fatigability.  She is currently on 10 mg prednisone daily, uses Advair on as needed basis, and voriconazole for invasive pulmonary aspergillosis.  She is also adherent to airway clearance with albuterol and hypertonic saline saline 7% but does not use the Acapella.  Currently on supplemental oxygen 5 L nasal cannula.      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  She is a lifelong nonsmoker.    MEDICATIONS AND ALLERGIES:     CURRENT MEDICATIONS:  Outpatient Encounter Medications as of 04/03/2019   Medication Sig Dispense Refill   ??? ACCU-CHEK FASTCLIX LANCET DRUM Misc USE TO CHECK GLUCOSE 4 TIMES DAILY BEFORE MEAL(S) AND NIGHTLY     ??? ACCU-CHEK GUIDE GLUCOSE METER Misc 1 Device by Other route Four (4) times a day (  before meals and nightly). AS DIRECTED 1 kit 0   ??? albuterol 2.5 mg /3 mL (0.083 %) nebulizer solution Inhale 3 mL (2.5 mg total) by nebulization every six (6) hours as needed for wheezing or shortness of breath (airway clearance/cough). 120 vial 11   ??? albuterol HFA 90 mcg/actuation inhaler Inhale 2 puffs every four (4) hours as needed for wheezing or shortness of breath. 2 Inhaler 11   ??? blood sugar diagnostic Strp by Other route Four (4) times a day (before meals and nightly). 400 strip 1   ??? budesonide-formoterol (SYMBICORT) 160-4.5 mcg/actuation inhaler Inhale 2 puffs Two (2) times a day. 3 Inhaler 2   ??? cyclobenzaprine (FLEXERIL) 10 MG tablet Take 1 tablet (10 mg total) by mouth Three (3) times a day as needed. 90 tablet 0   ??? empty container Misc USE AS DIRECTED 1 each 2   ??? EPINEPHrine (EPIPEN 2-PAK) 0.3 mg/0.3 mL injection Inject 0.3 mL (0.3 mg total) into the muscle once for 1 dose. 1 Device 1   ??? gabapentin (NEURONTIN) 300 MG capsule Take 1 capsule (300 mg total) by mouth Three (3) times a day. 270 capsule 1   ??? hydroCHLOROthiazide (HYDRODIURIL) 25 MG tablet Take 1 tablet (25 mg total) by mouth daily. TAKE ONE (1) TABLET BY MOUTH EVERY DAY 90 tablet 1   ??? insulin ASPART (NOVOLOG U-100 INSULIN ASPART) 100 unit/mL injection Take 1-20 units SQ QID AC and HS as directed 80 mL 1   ??? insulin glargine (BASAGLAR, LANTUS) 100 unit/mL (3 mL) injection pen 18 units 30 pen 3   ??? ipratropium (ATROVENT) 0.02 % nebulizer solution Inhale 2.5 mL (500 mcg total) by nebulization Four (4) times a day. 300 mL 3   ??? MEDICAL SUPPLY ITEM Accucheck Glide Glocumeter for home glucose checks. Check blood glucose ACHS. 1 Package 0   ??? mepolizumab 100 mg/mL AtIn Inject 100 mg under the skin every twenty-eight (28) days. 1 mL 12   ??? metFORMIN (GLUCOPHAGE) 1000 MG tablet Take 1 tablet (1,000 mg total) by mouth 2 (two) times a day with meals. 180 tablet 1   ??? miscellaneous medical supply Misc Nebulizer machine and tubing. Patients machine is 56 years old. 1 each 0   ??? OXYGEN-AIR DELIVERY SYSTEMS MISC Dose: 2-3 LPM     ??? posaconazole (NOXAFIL) 100 mg TbEC delayed released tablet Take 200 mg by mouth daily. 60 tablet 2   ??? pravastatin (PRAVACHOL) 40 MG tablet Take 1 tablet (40 mg total) by mouth daily with evening meal. 90 tablet 1   ??? predniSONE (DELTASONE) 5 MG tablet Take 5 mg by mouth daily.     ??? sodium chloride 7% 7 % Nebu Inhale 1 vial (4 mL) by nebulization Two (2) times a day. 720 mL 3   ??? [DISCONTINUED] cyclobenzaprine (FLEXERIL) 10 MG tablet Take 1 tablet by mouth three times daily as needed 90 tablet 0   ??? [DISCONTINUED] gabapentin (NEURONTIN) 300 MG capsule Take 1 capsule (300 mg total) by mouth Three (3) times a day. 270 capsule 1   ??? [DISCONTINUED] hydroCHLOROthiazide (HYDRODIURIL) 25 MG tablet Take 1 tablet (25 mg total) by mouth daily. TAKE ONE (1) TABLET BY MOUTH EVERY DAY 90 tablet 1   ??? [DISCONTINUED] metFORMIN (GLUCOPHAGE) 1000 MG tablet Take 1 tablet (1,000 mg total) by mouth 2 (two) times a day with meals. 180 tablet 1     No facility-administered encounter medications on file as of 04/03/2019.  ALLERGIES:  Allergies as of 04/03/2019   ??? (No Known Allergies)       PHYSICAL EXAM:     N/A      DIAGNOSTIC REVIEW:     All imaging and labs were reviewed personally.      Echo 12/30/18:  1. The left ventricle is normal in size with mildly increased wall  thickness.    2. Normal left ventricular systolic function, ejection fraction 60-65%.    3. Diastolic dysfunction - grade I (normal filling pressures).    4. Aortic sclerosis.    5. Normal right ventricular size and systolic function.    6. Pulmonary systolic pressure cannot be estimated due to insufficient TR  jet.    PET scan 12/09/18  - Metabolically active fibrosis and mediastinal and supraclavicular nodes likely reflect active disease; overall level of avidity is moderate, with uptake similar to liver. Appearance of CT is mildly improved from prior, although substantial fibrosis remains.    CTA PE Protocol 02/13/18:  No pulmonary embolism or acute airspace disease.    Since prior study dated 09/26/2016, there has been progressive mediastinal lymphadenopathy, and pulmonary fibrosis, related to end-stage sarcoidosis.   ??  Dilated main pulmonary artery  as can be seen with pulmonary hypertension.      EKG 02/13/18  SINUS TACHYCARDIA WITH 1ST DEGREE AV BLOCK  PULMONARY DISEASE PATTERN  LEFT ANTERIOR FASCICULAR BLOCK  NONSPECIFIC T WAVE ABNORMALITY ABNORMAL ECG  WHEN COMPARED WITH ECG OF 14-Dec-2017 13:05,  PREMATURE VENTRICULAR BEATS ARE NO LONGER PRESENT  QRS AXIS SHIFTED LEFT  Confirmed by Warnell Forester ([id number]) on 02/13/2018 10:03:18 PM    LFTs  Lab Results   Component Value Date    ALKPHOS 73 08/28/2018    BILITOT 0.4 08/28/2018    BILIDIR <0.10 12/14/2017    PROT 7.8 08/28/2018    ALBUMIN 4.0 08/28/2018    ALT 11 08/28/2018    AST 15 08/28/2018      Calcium   Date Value Ref Range Status   08/28/2018 9.7 8.5 - 10.2 mg/dL Final      Echocardiography 12/30/2018    1. The left ventricle is normal in size with mildly increased wall  thickness.    2. Normal left ventricular systolic function, ejection fraction 60-65%.    3. Diastolic dysfunction - grade I (normal filling pressures).    4. Aortic sclerosis.    5. Normal right ventricular size and systolic function.    6. Pulmonary systolic pressure cannot be estimated due to insufficient TR  jet.    ASSESSMENT AND PLAN:     Karen Barber is a pleasant 54 y.o. African-American woman with history of sarcoidosis with obstructive and restrictive lung disease and aspergillus s/p LUL lobectomy with recurrence in 08/2016, now on chronic posaconazole whom we are seeing virtually for follow-up management of the same. Currently, on prednisone 10 mg since her last hospitalization 02/13/18-02/18/18.    Her symptoms improved previously with Augmentin, airway clearance and prednisone. CTA negative for PE but increased lymphadenopathy and potentially worsening parenchymal disease from prior although interpretation is limited by artifact and PE protocol study. Echo with normal cardiac function and no evidence of elevated PASP or right heart disease but grade 1 diastolic dysfunction.  Continues to have dyspnea on exertion despite being on prednisone for 8 weeks.  Etiology of her symptoms is likely active pulmonary sarcoidosis given prior CT findings and PET scan. Other potential causes include infection or possibly related to her underlying reactive airway  disease.  Pulmonary hypertension is also a consideration with a potential pulmonary artery stenosis from prior PA plasty during her left upper lobectomy in 2016 or extrinsic compression by enlarged LNs.     She also had NTM AFB in prior sputum (M. Fortuitum). This has grown twice previously from expectorated sputum. Unlikely to represent pathogen currently given that current symptoms improved with Prednisone and abx. Repeat AFB from recently hospitalization remain NGTD although are not finalized currently. Will consider treating this if symptoms persist.    I suspect bronchiectasis has a central role in her symptoms especially that she was not using any airway clearance measure until recently. Improvement previously with airway clearance and abx (Augmentin) further supports this (ie, suggests contribution from bacterial infection). We discussed at length the importance of using albuterol, HTS, and her airway clearance device.    I encouraged her to continue aggressive airway clearance. IAsthma possibly contributing as well and she previously was on Nucala. She is currently using Advair as needed while on albuterol neb q6 hrs. We discussed using Advair regularily to facilitate weaning off prednisone..     Her pulmonary sarcoidosis is challenging. There is evidence of a ctive parenchymal sarcoidosis on most recent PET scan. However, intensifying immunosuppressive therapy will be challenging given active infection (invasive aspergillosis). There will be a risk of activating mycobacterial disease as well. If we decide to increase her steroids or add a steroid-sparing therapy, this will have to be well coordinated with our ID colleagues. I discussed enrolling her in the CLEAR therapy trial at [facility] (anti-mycobacterial therapy) as there is some evidence that treating mycobacterial Ags may restore T cell function and improve sarcoidosis. She is agreeing to this and I contacted the PI at [facility]. Unfortunately, they completed enrolling patients.     At this time, we discussed getting CPET to further investigate her DOE. I also still worry about PH given enlarged PA on CT and suggested RHC. She agreed to both.      PLAN:    Pulmonary sarcoidosis complicated by chronic hypoxemic and hypercapnic respiratory failure:  -- Continue prednisone 10 mg daily for now  -- Continue supplemental oxygen (6 L based on ).  We will work to obtain portable oxygen concentrator versus portable noninvasive oxygen supplementation and positive airway pressure.  -- Annual spirometry, DLCO   -- Annual ophthalmology and EKG (if abnormal EKG or cardiac symptoms, will proceed to cardiac MRI)  -- She has been referred for evaluation of pulmonary transplant in the past however is not a candidate due to prior PA plasty which precludes her ability to undergo the surgery for lung transplantation her thoracic surgery  -- Continue Life 2000 home ventilator.  -- Will obtain CPET and arrange for RHC      Bronchiectasis:  -- Encouraged to use of airway clearance device, aiming for twice daily and can pre-treat with Albuterol to improve clearance  -- Continue  albuterol, ipratropium, and hypertonic saline 7% twice daily for airway clearance    Chronic Invasive Aspergillosis: s/p LUL lobectomy. Recurrence in 08/2016  -- Currently on posaconazole (changed in January 2020 from voriconazole per ID recommendations).   -- Continue to follow with ID.  -- Fungal sputum culture, lower respiratory culture and AFB today    Asthma:  -- Continue Advair inh (asked to use it twice daily)    NTM AFB from prior sputum (M. Fortuitum), likely represents colonization/contamination. Has not grown on most recent AFB cultures. No indication for treatment currently.  --Repeat  AFB sputum culture every visit    OSA symptoms:  -- Sleep study     Health maintenance:  -- Vaccines: up-to-date  -- Smoking cessation: N/A (lifelong non-smoker)  -- Lung cancer screening: N/A      RTC as previously scheduled in 3 months     Tashai Catino O. Wellington Hampshire, MD   Assistant Professor of Medicine  Pulmonary and Critical Care Medicine  [facility] Pulmonary Hypertension Program    I spent 35 minutes on the phone visit with the patient. I spent an additional 18 minutes on pre- and post-visit activities.     The patient was not located and I was located within 250 yards of a hospital based location during the phone visit. The patient was physically located in [place] or a state in which I am permitted to provide care. The patient and/or parent/guardian understood that s/he may incur co-pays and cost sharing, and agreed to the telemedicine visit. The visit was reasonable and appropriate under the circumstances given the patient's presentation at the time.    The patient and/or parent/guardian has been advised of the potential risks and limitations of this mode of treatment (including, but not limited to, the absence of in-person examination) and has agreed to be treated using telemedicine. The patient's/patient's family's questions regarding telemedicine have been answered.    If the visit was completed in an ambulatory setting, the patient and/or parent/guardian has also been advised to contact their provider???s office for worsening conditions, and seek emergency medical treatment and/or call 911 if the patient deems either necessary.

## 2019-04-03 ENCOUNTER — Telehealth: Admit: 2019-04-03 | Discharge: 2019-04-04 | Payer: MEDICARE | Attending: Pulmonary Disease | Primary: Pulmonary Disease

## 2019-04-07 NOTE — Unmapped (Signed)
Thank you for allowing me to be a part of your care.  Please call the clinic with any questions.  ??  Christen Butter, MD  Pulmonary and Critical Care Medicine  [address]  [id number]  [place], [place] [address]  ??  Thank you for your visit to the [facility].   ??  Between appointments, you can reach [place] at these numbers:  ??  For appointments or the Pulmonary Nurse: [phone]  Fax: [phone]  ??  For urgent issues after hours:  Hospital Operator: [phone], ask for Pulmonary Fellow on call

## 2019-04-08 DIAGNOSIS — R0609 Other forms of dyspnea: Principal | ICD-10-CM

## 2019-04-08 MED FILL — EMPTY CONTAINER: 120 days supply | Qty: 1 | Fill #1 | Status: AC

## 2019-04-08 MED FILL — SODIUM CHLORIDE 7 % FOR NEBULIZATION: RESPIRATORY_TRACT | 90 days supply | Qty: 720 | Fill #2

## 2019-04-08 MED FILL — NUCALA 100 MG/ML SUBCUTANEOUS AUTO-INJECTOR: 28 days supply | Qty: 1 | Fill #9 | Status: AC

## 2019-04-08 MED FILL — EMPTY CONTAINER: 120 days supply | Qty: 1 | Fill #1

## 2019-04-08 MED FILL — SODIUM CHLORIDE 7 % FOR NEBULIZATION: 90 days supply | Qty: 720 | Fill #2 | Status: AC

## 2019-04-08 MED FILL — NUCALA 100 MG/ML SUBCUTANEOUS AUTO-INJECTOR: SUBCUTANEOUS | 28 days supply | Qty: 1 | Fill #9

## 2019-04-11 DIAGNOSIS — Z7952 Long term (current) use of systemic steroids: Principal | ICD-10-CM

## 2019-04-11 DIAGNOSIS — J4550 Severe persistent asthma, uncomplicated: Principal | ICD-10-CM

## 2019-04-29 ENCOUNTER — Ambulatory Visit: Admit: 2019-04-29 | Discharge: 2019-04-29 | Payer: MEDICARE

## 2019-04-29 LAB — BASIC METABOLIC PANEL
ANION GAP: 6 mmol/L — ABNORMAL LOW (ref 7–15)
BLOOD UREA NITROGEN: 27 mg/dL — ABNORMAL HIGH (ref 7–21)
BUN / CREAT RATIO: 21
CHLORIDE: 95 mmol/L — ABNORMAL LOW (ref 98–107)
CO2: 33 mmol/L — ABNORMAL HIGH (ref 22.0–30.0)
CREATININE: 1.31 mg/dL — ABNORMAL HIGH (ref 0.60–1.00)
EGFR CKD-EPI AA FEMALE: 53 mL/min/{1.73_m2} — ABNORMAL LOW (ref >=60)
EGFR CKD-EPI NON-AA FEMALE: 46 mL/min/{1.73_m2} — ABNORMAL LOW (ref >=60)
GLUCOSE RANDOM: 238 mg/dL — ABNORMAL HIGH (ref 70–179)
POTASSIUM: 4.9 mmol/L (ref 3.5–5.0)
SODIUM: 134 mmol/L — ABNORMAL LOW (ref 135–145)

## 2019-04-29 LAB — CALCIUM: Calcium:MCnc:Pt:Ser/Plas:Qn:: 9.4

## 2019-04-29 LAB — CBC
HEMATOCRIT: 39.4 % (ref 36.0–46.0)
MEAN CORPUSCULAR HEMOGLOBIN CONC: 31.7 g/dL (ref 31.0–37.0)
MEAN CORPUSCULAR HEMOGLOBIN: 27.4 pg (ref 26.0–34.0)
MEAN CORPUSCULAR VOLUME: 86.6 fL (ref 80.0–100.0)
MEAN PLATELET VOLUME: 9.5 fL (ref 7.0–10.0)
PLATELET COUNT: 243 10*9/L (ref 150–440)
RED BLOOD CELL COUNT: 4.56 10*12/L (ref 4.00–5.20)
RED CELL DISTRIBUTION WIDTH: 14.2 % (ref 12.0–15.0)
WBC ADJUSTED: 7.1 10*9/L (ref 4.5–11.0)

## 2019-04-29 LAB — HEMOGLOBIN: Hemoglobin:MCnc:Pt:Bld:Qn:: 12.5

## 2019-04-29 NOTE — Unmapped (Signed)
Cardiac Catheterization Laboratory  [facility], [place]  Tel: [phone]     Fax: [phone]    PRELIMINARY CARDIAC CATHETERIZATION REPORT  (Full Report to Follow within 72 hours)  ____________________________________________________________________________  PCP:  Noralyn Pick, FNP  Phone:  [phone]  Fax:  [phone]    Referring Physicians:  Rosana Hoes, Md  [address]  [id number]  Dept Of Med  [place],  [place] [address]     Indication:  Karen Barber is a 54 y.o. female with sarcoidosis who was referred for cardiac catheterization for evaluation.    Findings:    Recommendations:  Medical Management per pulmonary team    ____________________________________________________________________________     Performing Attending:  Janey Genta, MD, MPH  Diagnostic Fellow:  Vernona Rieger, MD  Procedures performed: Right heart Catheterization  Arterial Access Site:   None  Arterial Closure:  None  Venous Access Site:   Right Internal Jugular Vein  Contrast Volume:  0 cc  Specimens:   0  Estimated Blood Loss  <25 ml      Right Heart Catheterization     RA mean: 5  mmHg  RV: 57/8 mmHg  PA (mean): 55/17 (33) mmHg  PCWP mean: 12 mmHg  PA Sat:  50 %  Art Sat: 89 % (oximetry)  Fick Cardiac output: 4.9 L/min  Fick Cardiac index: 2.1 L/min/m2  TD Cardiac output: 5.1 L/min  TD Cardiac index: 2.3 L/min/m2    Ameer Sanden A. Hessie Dibble, MD, MPH  Interventional Cardiology  Assistant Professor of Medicine  [place] [facility] [place] at [facility]  [email or web address]   -    Office [phone]

## 2019-05-01 DIAGNOSIS — D869 Sarcoidosis, unspecified: Principal | ICD-10-CM

## 2019-05-01 DIAGNOSIS — J449 Chronic obstructive pulmonary disease, unspecified: Principal | ICD-10-CM

## 2019-05-01 DIAGNOSIS — B441 Other pulmonary aspergillosis: Principal | ICD-10-CM

## 2019-05-01 MED ORDER — ALBUTEROL SULFATE 2.5 MG/3 ML (0.083 %) SOLUTION FOR NEBULIZATION
Freq: Four times a day (QID) | RESPIRATORY_TRACT | 0 refills | 10 days | Status: CP | PRN
Start: 2019-05-01 — End: ?

## 2019-05-01 MED ORDER — IPRATROPIUM BROMIDE 0.02 % SOLUTION FOR INHALATION
Freq: Four times a day (QID) | RESPIRATORY_TRACT | 3 refills | 30.00000 days | Status: CP
Start: 2019-05-01 — End: ?

## 2019-05-01 NOTE — Unmapped (Signed)
[facility] Refill Coordination Note    Specialty Medication(s) to be Shipped:   CF/Pulmonary: -Nucala 100mg /ml     Karen Barber, DOB: 1965/10/21  Phone: [phone] (home) [phone] (work)    All above HIPAA information was verified with patient.     Was a Nurse, learning disability used for this call? No    Completed refill call assessment today to schedule patient's medication shipment from the [facility] [phone]).       Specialty medication(s) and dose(s) confirmed: Regimen is correct and unchanged.   Changes to medications: Karen Barber reports no changes at this time.  Changes to insurance: No  Questions for the pharmacist: No    Confirmed patient received Welcome Packet with first shipment. The patient will receive a drug information handout for each medication shipped and additional FDA Medication Guides as required.       DISEASE/MEDICATION-SPECIFIC INFORMATION        For CF patients: CF [facility] Active? No-not enrolled    SPECIALTY MEDICATION ADHERENCE     Medication Adherence    Patient reported X missed doses in the last month: 0  Specialty Medication: Nucala 100mg /ml  Patient is on additional specialty medications: No  Informant: patient  Reliability of informant: reliable        Nucala 100mg /ml : 0 days of medicine on hand. Next injection is scheduled for 28th of Feb.    SHIPPING     Shipping address confirmed in Epic.     Delivery Scheduled: Yes, Expected medication delivery date: 05/15/2019.     Medication will be delivered via Same Day Courier to the prescription address in Epic WAM.    Karen Barber   [facility] Specialty Technician

## 2019-05-01 NOTE — Unmapped (Signed)
P/c requesting refill atrovent  Last office visit 01/08/2019  Last refill date 11/01/2018  Dispense number   Refill number 3  Future visit none

## 2019-05-02 DIAGNOSIS — B441 Other pulmonary aspergillosis: Principal | ICD-10-CM

## 2019-05-02 DIAGNOSIS — D860 Sarcoidosis of lung: Principal | ICD-10-CM

## 2019-05-02 MED ORDER — ALBUTEROL SULFATE 2.5 MG/3 ML (0.083 %) SOLUTION FOR NEBULIZATION
Freq: Four times a day (QID) | RESPIRATORY_TRACT | 0 refills | 10 days | Status: CP | PRN
Start: 2019-05-02 — End: ?

## 2019-05-02 MED ORDER — POSACONAZOLE 100 MG TABLET,DELAYED RELEASE
ORAL_TABLET | Freq: Every day | ORAL | 2 refills | 30.00000 days | Status: CP
Start: 2019-05-02 — End: ?

## 2019-05-02 MED ORDER — PREDNISONE 5 MG TABLET
ORAL_TABLET | Freq: Every day | ORAL | 1 refills | 30 days | Status: CP
Start: 2019-05-02 — End: ?

## 2019-05-07 DIAGNOSIS — D869 Sarcoidosis, unspecified: Principal | ICD-10-CM

## 2019-05-07 DIAGNOSIS — J418 Mixed simple and mucopurulent chronic bronchitis: Principal | ICD-10-CM

## 2019-05-07 MED ORDER — BUDESONIDE-FORMOTEROL HFA 160 MCG-4.5 MCG/ACTUATION AEROSOL INHALER
Freq: Two times a day (BID) | RESPIRATORY_TRACT | 2 refills | 30.00000 days | Status: CP
Start: 2019-05-07 — End: 2019-08-05

## 2019-05-07 MED ORDER — ALBUTEROL SULFATE HFA 90 MCG/ACTUATION AEROSOL INHALER
RESPIRATORY_TRACT | 0 refills | 0 days | Status: CP | PRN
Start: 2019-05-07 — End: 2020-05-06

## 2019-05-07 NOTE — Unmapped (Signed)
Pharmacy Refill Request  Symbicort Inhaler    Last OV:   04/03/2019    Upcoming appointment:   05/13/2019

## 2019-05-08 NOTE — Unmapped (Deleted)
Assessment and Plan:     There are no diagnoses linked to this encounter.    HPI:      Karen Barber  is here for No chief complaint on file.    {kmchronicdx:61999}    {kmacutedx:61998}     [facility] Components:     Goals     ??? Get out more often and participate in more activities      Pt wanting to get my energy level back by getting out more and doing more to prevent feeling depressed.            Medication adherence and barriers to the treatment plan have been addressed. Opportunities to optimize healthy behaviors have been discussed. Patient / caregiver voiced understanding.      Past Medical/Surgical History:     Past Medical History:   Diagnosis Date   ??? 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 Breast injury     lung surg on left incision under breast sept 2016   ??? Hepatitis C antibody test positive 2016    repeatedly negative HCV RNA, indicating clearance of infection w/o treatment   ??? Hyperlipidemia    ??? Hypertension    ??? 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 On home O2 2008    per Dr Mal Amabile note from 02/21/2017   ??? Pulmonary aspergillosis (CMS-HCC) 2016    A.fumigatus in 2016, prior to LUL lobectomy. A.niger from sputum 08/2016-09/2016.   ??? 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 Sarcoidosis 1995   ??? Type 2 diabetes mellitus with hyperglycemia (CMS-HCC) 07/27/2014     Past Surgical History:   Procedure Laterality Date   ??? LUNG LOBECTOMY     ??? PR RIGHT HEART CATH O2 SATURATION & CARDIAC OUTPUT N/A 04/29/2019    Procedure: Right Heart Catheterization;  Surgeon: Rosana Hoes, MD;  Location: [facility] CATH;  Service: Cardiology   ??? PR THORACOSCOPY SURG TOT PULM DECORT Left 12/14/2014    Procedure: THORACOSCOPY SURG; W/TOT PULM DECORTIC/PNEUMOLYS;  Surgeon: Evert Kohl, MD;  Location: MAIN OR [facility];  Service: Cardiothoracic   ??? PR THORACOSCOPY W/THERA WEDGE RESEXN INITIAL UNILAT Left 12/03/2014    Procedure: THORACOSCOPY, SURGICAL; WITH THERAPEUTIC WEDGE RESECTION (EG, MASS, NODULE) INITIAL UNILATERAL;  Surgeon: Evert Kohl, MD;  Location: MAIN OR [facility];  Service: Cardiothoracic       Family History:     Family History   Problem Relation Age of Onset   ??? Diabetes Father    ??? Diabetes Brother    ??? Diabetes Maternal Aunt    ??? Sarcoidosis Maternal Aunt    ??? Diabetes Maternal Uncle    ??? Diabetes Paternal Aunt    ??? Diabetes Paternal Uncle        Social History:     Social History     Socioeconomic History   ??? Marital status: Single     Spouse name: Not on file   ??? Number of children: Not on file   ??? Years of education: Not on file   ??? Highest education level: Not on file   Occupational History   ??? Not on file   Social Needs   ??? Financial resource strain: Not on file   ??? Food insecurity     Worry: Not on file     Inability: Not on file   ??? Transportation  needs     Medical: Not on file     Non-medical: Not on file   Tobacco Use   ??? Smoking status: Former Smoker     Packs/day: 0.50     Years: 9.00     Pack years: 4.50     Types: Cigarettes     Quit date: 05/03/1987     Years since quitting: 32.0   ??? Smokeless tobacco: Never Used   Substance and Sexual Activity   ??? Alcohol use: No     Alcohol/week: 0.0 standard drinks   ??? Drug use: No   ??? Sexual activity: Yes   Lifestyle   ??? Physical activity     Days per week: Not on file     Minutes per session: Not on file   ??? Stress: Not on file   Relationships   ??? Social Wellsite geologist on phone: Not on file     Gets together: Not on file     Attends religious service: Not on file     Active member of club or organization: Not on file     Attends meetings of clubs or organizations: Not on file     Relationship status: Not on file   Other Topics Concern   ??? Do you use sunscreen? No   ??? Tanning bed use? No   ??? Are you easily burned? No   ??? Excessive sun exposure? No   ??? Blistering sunburns? No   Social History Narrative   ??? Not on file Allergies:     Patient has no known allergies.    Current Medications:     Current Outpatient Medications   Medication Sig Dispense Refill   ??? ACCU-CHEK FASTCLIX LANCET DRUM Misc USE TO CHECK GLUCOSE 4 TIMES DAILY BEFORE MEAL(S) AND NIGHTLY     ??? ACCU-CHEK GUIDE GLUCOSE METER Misc 1 Device by Other route Four (4) times a day (before meals and nightly). AS DIRECTED 1 kit 0   ??? albuterol 2.5 mg /3 mL (0.083 %) nebulizer solution Inhale 3 mL (2.5 mg total) by nebulization every six (6) hours as needed for wheezing or shortness of breath (airway clearance/cough). 120 mL 0   ??? albuterol HFA 90 mcg/actuation inhaler Inhale 2 puffs every four (4) hours as needed for wheezing or shortness of breath. 8.5 g 0   ??? blood sugar diagnostic Strp by Other route Four (4) times a day (before meals and nightly). 400 strip 1   ??? budesonide-formoteroL (SYMBICORT) 160-4.5 mcg/actuation inhaler Inhale 2 puffs Two (2) times a day. 20 g 2   ??? cyclobenzaprine (FLEXERIL) 10 MG tablet Take 1 tablet (10 mg total) by mouth Three (3) times a day as needed. 90 tablet 0   ??? empty container Misc USE AS DIRECTED 1 each 2   ??? EPINEPHrine (EPIPEN 2-PAK) 0.3 mg/0.3 mL injection Inject 0.3 mL (0.3 mg total) into the muscle once for 1 dose. 1 Device 1   ??? gabapentin (NEURONTIN) 300 MG capsule Take 1 capsule (300 mg total) by mouth Three (3) times a day. 270 capsule 1   ??? hydroCHLOROthiazide (HYDRODIURIL) 25 MG tablet Take 1 tablet (25 mg total) by mouth daily. TAKE ONE (1) TABLET BY MOUTH EVERY DAY 90 tablet 1   ??? insulin ASPART (NOVOLOG U-100 INSULIN ASPART) 100 unit/mL injection Take 1-20 units SQ QID AC and HS as directed 80 mL 1   ??? insulin glargine (BASAGLAR, LANTUS) 100 unit/mL (3 mL) injection pen  18 units 30 pen 3   ??? ipratropium (ATROVENT) 0.02 % nebulizer solution Inhale 2.5 mL (500 mcg total) by nebulization Four (4) times a day. 300 mL 3   ??? MEDICAL SUPPLY ITEM Accucheck Glide Glocumeter for home glucose checks. Check blood glucose ACHS. 1 Package 0   ??? mepolizumab 100 mg/mL AtIn Inject 100 mg under the skin every twenty-eight (28) days. 1 mL 12   ??? metFORMIN (GLUCOPHAGE) 1000 MG tablet Take 1 tablet (1,000 mg total) by mouth 2 (two) times a day with meals. 180 tablet 1   ??? miscellaneous medical supply Misc Nebulizer machine and tubing. Patients machine is 56 years old. 1 each 0   ??? OXYGEN-AIR DELIVERY SYSTEMS MISC Dose: 2-3 LPM     ??? posaconazole (NOXAFIL) 100 mg TbEC delayed released tablet Take 200 mg by mouth daily. 60 tablet 2   ??? pravastatin (PRAVACHOL) 40 MG tablet Take 1 tablet (40 mg total) by mouth daily with evening meal. 90 tablet 1   ??? predniSONE (DELTASONE) 5 MG tablet Take 2 tablets (10 mg total) by mouth daily. 60 tablet 1   ??? sodium chloride 7% 7 % Nebu Inhale 1 vial (4 mL) by nebulization Two (2) times a day. 720 mL 3     No current facility-administered medications for this visit.        Health Maintenance:     Health Maintenance Summary w/Most Recent Date       Status Date      HPV Cotest with Pap Smear (21-65) Overdue 06/15/1984     Pap Smear with Cotest HPV (21-65) Overdue 02/27/2001      Done 02/28/1996 Registry Metric: Pap Smear date     Done 02/28/1996 PAP SMEAR (HISTORICAL RESULT)    FIT-DNA Stool Test Overdue 06/15/2013     Zoster Vaccines Overdue 06/15/2013     DTaP/Tdap/Td Vaccines Overdue 07/07/2017      Done 07/08/2007 Imm Admin: TdaP    Hemoglobin A1c Overdue 02/01/2019      Done 11/01/2018 Registry Metric: Last Hemoglobin A1c Date     Done 11/01/2018 HEMOGLOBIN A1C Hemoglobin A1C           Done 03/05/2018 HEMOGLOBIN A1C Hemoglobin A1C           Done 11/05/2017 HEMOGLOBIN A1C Hemoglobin A1C           Done 07/03/2017 POCT GLYCOSYLATED HEMOGLOBIN (HGB A1C) HGB A1C, RAP/PDS           Patient has more history with this topic...    Foot Exam Next Due 11/01/2019      Done 11/01/2018 HM DIABETES FOOT EXAM     Done 11/05/2017 HM DIABETES FOOT EXAM     Done 06/14/2016 HM DIABETES FOOT EXAM     Done 11/05/2012 HM DIABETES FOOT EXAM    Urine Albumin/Creatinine Ratio Next Due 11/01/2019      Done 11/01/2018 ALBUMIN / CREATININE URINE RATIO Albumin/Creatinine Ratio           Done 11/05/2017 ALBUMIN / CREATININE URINE RATIO Albumin/Creatinine Ratio           Done 06/14/2016 ALBUMIN / CREATININE URINE RATIO Albumin/Creatinine Ratio          Retinal Eye Exam Next Due 11/15/2019      Done 11/15/2018 HM DIABETES EYE EXAM HM Diabetic Eye Exam           Done 04/12/2016 HM DIABETES EYE EXAM HM Diabetic Eye Exam  Done 06/18/2014 SmartData: OPHTH FUNDUS OD PERIPHERY     Done 06/18/2014 SmartData: OPHTH FUNDUS OS PERIPHERY     Done 06/18/2014 SmartData: FINDINGS - PE - EYES - FUNDUSCOPIC - PERIPHERY - LEFT PERIPHERY NORMAL    Mammogram Start Age 67 Next Due 02/29/2020      Done 02/28/2018 MAMMO SCREENING BILATERAL     Done 03/16/2015 MAMMO SCREENING BILATERAL    Serum Creatinine Monitoring Next Due 04/28/2020      Done 04/29/2019 Registry Metric: Serum creatinine     Done 08/02/2016 Ext Proc: CHG BASIC METABOLIC PANEL CALCIUM TOTAL     Done 04/29/2019 BASIC METABOLIC PANEL Creatinine           Done 08/28/2018 COMPREHENSIVE METABOLIC PANEL Creatinine           Done 05/22/2018 COMPREHENSIVE METABOLIC PANEL Creatinine           Patient has more history with this topic...    Potassium Monitoring Next Due 04/28/2020      Done 04/29/2019 Registry Metric: Potassium     Done 08/02/2016 Ext Proc: CHG BASIC METABOLIC PANEL CALCIUM TOTAL     Done 04/29/2019 BASIC METABOLIC PANEL Potassium           Done 08/28/2018 COMPREHENSIVE METABOLIC PANEL Potassium           Done 05/22/2018 COMPREHENSIVE METABOLIC PANEL Potassium           Patient has more history with this topic...    COPD Spirometry Next Due 01/04/2022      Done 01/04/2017 FLOW VOLUME LOOP     Done 12/16/2015 SPIROMETRY PRE / POST     Done 11/20/2014 FLOW VOLUME LOOP     Done 06/25/2013 SPIROMETRY    Pneumococcal Vaccine This plan is no longer active.      Done 01/03/2017 Imm Admin: PNEUMOCOCCAL POLYSACCHARIDE 23     Done 07/08/2007 Imm Admin: PNEUMOCOCCAL POLYSACCHARIDE 23    Hepatitis C Screen This plan is no longer active.      Done 01/08/2019 HEPATITIS C RNA, QUANTITATIVE, PCR     Done 11/24/2014 HEPATITIS C ANTIBODY Hepatitis C Ab           Done 11/18/2014 HEPATITIS C RNA, QUANTITATIVE, PCR     Done 11/17/2014 HEPATITIS C ANTIBODY Hepatitis C Ab           Done 11/02/2014 HEPATITIS C RNA, QUANTITATIVE, PCR     Patient has more history with this topic...    Influenza Vaccine This plan is no longer active.      Done 01/08/2019 Imm Admin: Influenza Vaccine Quad (IIV4 PF) 34mo+ injectable     Done 12/06/2017 Imm Admin: Influenza Vaccine Quad (IIV4 PF) 72mo+ injectable     Done 01/03/2017 Imm Admin: Influenza Vaccine Quad (IIV4 PF) 74mo+ injectable     Done 12/16/2015 Imm Admin: Influenza Vaccine Quad (IIV4 PF) 67mo+ injectable     Done 12/19/2014 Imm Admin: Influenza Vaccine Quad (IIV4 PF) 4mo+ injectable     Patient has more history with this topic...          Immunizations:     Immunization History   Administered Date(s) Administered   ??? INFLUENZA TIV (TRI) PF (IM) 01/02/2005, 12/14/2008   ??? Influenza Vaccine Quad (IIV4 PF) 50mo+ injectable 11/28/2012, 12/19/2014, 12/16/2015, 01/03/2017, 12/06/2017, 01/08/2019   ??? PNEUMOCOCCAL POLYSACCHARIDE 23 07/08/2007, 01/03/2017   ??? PPD Test 01/19/2015   ??? TdaP 07/08/2007       I have reviewed and (if needed) updated the  patient's problem list, medications, allergies, past medical and surgical history, social and family history.    ROS:      ROS  Comprehensive 10 point ROS negative unless otherwise stated in the HPI.       Vital Signs:     Wt Readings from Last 3 Encounters:   04/29/19 (!) 107 kg (236 lb)   01/24/19 (!) 103.4 kg (228 lb)   01/08/19 (!) 103.4 kg (228 lb)     Temp Readings from Last 3 Encounters:   04/29/19 36.7 ??C (98 ??F) (Oral)   01/24/19 36.6 ??C (97.9 ??F) (Oral)   01/08/19 36.8 ??C (98.2 ??F) (Oral)     BP Readings from Last 3 Encounters:   04/29/19 141/90   01/24/19 152/107   01/08/19 124/82     Pulse Readings from Last 3 Encounters:   04/29/19 90   01/24/19 106   01/08/19 80     Estimated body mass index is 33.86 kg/m?? as calculated from the following:    Height as of 01/24/19: 177.8 cm (5' 10).    Weight as of 04/29/19: 107 kg (236 lb).  No height and weight on file for this encounter.        Objective:      General: Alert and oriented x3. Well-appearing. No acute distress. ***  HEENT:  Normocephalic.  Atraumatic. Conjunctiva and sclera normal. OP MMM without lesions. ***  Neck:  Supple. No thyroid enlargement. No adenopathy. ***  Heart:  Regular rate and rhythm . Normal S1, S2.  No murmurs, rubs or gallops. ***  Lungs:  No respiratory distress.  Lungs clear to auscultation. No wheezes, rhonchi, or rales. ***  GI/GU:  Soft, +BS, nondistended, non-TTP. No palpable masses or organomegaly. ***  MSK: Gait and station unremarkable. Normal ROM major joints. Normal strength and tone of proximal muscles.***   Extremities:  No edema. Peripheral pulses normal. ***  Skin:  Warm, dry. No rash or lesions present. ***  Neuro:  Non-focal. No obvious weakness. ***  Psych:  Affect normal, eye contact good, speech clear and coherent. ***     I attest that I, Bayard Hugger, personally documented this note while acting as scribe for Noralyn Pick, FNP.      Bayard Hugger, Scribe.  05/12/2019     The documentation recorded by the scribe accurately reflects the service I personally performed and the decisions made by me.    Noralyn Pick, FNP

## 2019-05-13 ENCOUNTER — Ambulatory Visit: Admit: 2019-05-13 | Discharge: 2019-05-14 | Payer: MEDICARE | Attending: Family | Primary: Family

## 2019-05-13 ENCOUNTER — Telehealth: Admit: 2019-05-13 | Discharge: 2019-05-14 | Payer: MEDICARE | Attending: Pulmonary Disease | Primary: Pulmonary Disease

## 2019-05-13 DIAGNOSIS — J471 Bronchiectasis with (acute) exacerbation: Principal | ICD-10-CM

## 2019-05-13 DIAGNOSIS — R0609 Other forms of dyspnea: Principal | ICD-10-CM

## 2019-05-13 MED ORDER — AMOXICILLIN 875 MG-POTASSIUM CLAVULANATE 125 MG TABLET
ORAL_TABLET | Freq: Two times a day (BID) | ORAL | 0 refills | 14 days | Status: CP
Start: 2019-05-13 — End: 2019-05-27

## 2019-05-13 NOTE — Unmapped (Signed)
Thank you for visiting the [facility] Pulmonary Hypertension [facility].       If you have any questions or concerns, please call [phone]  To reach:    Marva Panda, RN, Pulmonary Hypertension Nurse Coordinator  Claretha Cooper, RN, Pulmonary Hypertension Nurse Coordinator    Thank you,       Elsy Chiang O. Wellington Hampshire, MD   Assistant Professor of Medicine  Pulmonary and Critical Care Medicine  [facility] Pulmonary Hypertension Program                  Important phone numbers:  Please call us with any questions or concerns, and before starting or stopping new medications.   During normal business hours M-F: Call [facility] coordinators Marva Panda and Claretha Cooper) at [phone]  To make an appointment: Call the Pulmonary Clinic at [phone].  For questions on evenings or weekends please call the hospital operator and ask for the pulmonary fellow on call: [phone]             ------------------------------------------  Other helpful information:  1. Know where you get your medicines. Most pharmacies require a monthly phone call in order to get consent from you to ship. If they cannot reach you, they send [place] a letter and let us know that you can't be reached. If you have enough medication and don't need a shipment, still answer the call and let the pharmacy know. They can reschedule the call when you will need it.   2. When to call the [facility] coordinators  -When you are not feeling well or have a question related to your care of PH  -When you notice you are out of medication/need refills  -If you are confused about financial paperwork  -When you are told you have a high medication copay and need help  - When you are planning to have any elective procedure (dental extraction, colonoscopy or any other procedure that requires any type of sedation)  -If you need a MRI and are currently on Remodulin or Veletri  -when you go to the ED or are admitted to another hospital  -if your phone number changes  -if your insurance changes  -weight gain (see below)  -if you are having side effects of PAH medications.  See number 12 below.  6. Use a pill box to organize your medications so you will know ahead of time when you are close to running out of medication.   7. Try to stick to a low salt (2 grams or less daily) diet with a 2 liter fluid restriction.  8. Please try to answer all calls and if you cannot, please have a voicemail set up on your phone so that we can leave a message.   9. Re-enroll annually for any patient assistance funding programs. Just like insurance, they require re-enrollment annually to verify that you are still eligible.   10. Weigh yourself daily and write it down. If your weight increases by 2 lbs overnight or more than 5 lbs in a week, please call our office.   11. Participate in a local Support Group. Ask about one in your area.     Please call our office before discontinuing any medication. Many medications cause expected effects after therapy is started but these typically resolve with continuation of therapy or are manageable with treatment of the side effects.

## 2019-05-13 NOTE — Unmapped (Signed)
RETURN PULMONARY CLINIC VISIT:    Patient: Karen Barber([birth year])  Reason for visit: Hospital discharge follow up; pulmonary sarcoidosis    HISTORY OF PRESENT ILLNESS:     [name] is a pleasant 54 y.o. African-American woman with history of sarcoidosis, it was diagnosed by lip biopsy in 1995. She has been on 2 lpm [place] continuous oxygen for over 10 years now.  She also has a history of Aspergillus pneumonia and she was treated with voriconazole and left upper lobectromy in 11/2014.  She is currently not on any immunomodulating agents.      Recently admitted to [facility] for acute on chronic hypoxic respiratory failure and increasing dyspnea. Discharged on 02/18/18. Patient needed up to 5 LPM with exertion and 3 LPM at rest from her usual baseline of 2-3 LPM. CTA at admission showed increasing lymphadenopathy, but no PE or increasing parenchymal changes. Echo showed normal RV and LV function. She was treated with Augmentin, airway clearance and eventually the addition of Prednisone 60 mg daily, tapered to 40 mg daily at discharge. ID was consulted given her history of chronic invasive aspergillosis and recent NTM AFB growth from sputum of M. Fortuitum. Repeat AFB cultures from hospitalization have shown no growth to date. She has since seen her primary ID physician who changed her from Voriconazole to Posaconazole. At her last visit with me in 02/2018, she was referred for consideration of lung transplant, however, was deemed not a candidate due to anatomic abnormalities that would not allow the surgery to be performed. Follow up PFTs were also ordered to monitor with her Prednisone taper, however, these were never completed.    She reports increasing shortness of breath when weaning off of her prednisone.       REVIEW OF SYSTEMS: A comprehensive ROS was performed was reviewed and found to be negative except as above in the HPI.      INTERVAL HPI 05/13/19  Since last visit, patient feels stable from breathing standpoint.  She was having episodes of desaturation with exertion.  She started taking Augmentin on her own and noted significant improvement in her oxygen saturation.  In addition, she was able to walk more than her usual.    Denies orthopnea, lower extremity swelling, dizziness lightheadedness or syncope.  She remains on 10 mg prednisone daily, uses Symbicort, and posaconazole for invasive pulmonary aspergillosis.  She is also adherent to airway clearance with albuterol and hypertonic saline saline 7% but does not use the Acapella.  Currently on supplemental oxygen 5 L nasal cannula.    She recently underwent a right heart catheterization that revealed pulmonary hypertension with mean PAP of 33, PAWP 12, borderline cardiac output, and PVR of 4.3 Wood units.    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  She is a lifelong nonsmoker.    MEDICATIONS AND ALLERGIES:     CURRENT MEDICATIONS:  Outpatient Encounter Medications as of 05/13/2019   Medication Sig Dispense Refill   ??? ACCU-CHEK FASTCLIX LANCET DRUM Misc USE TO CHECK GLUCOSE 4 TIMES DAILY BEFORE  MEAL(S) AND NIGHTLY     ??? ACCU-CHEK GUIDE GLUCOSE METER Misc 1 Device by Other route Four (4) times a day (before meals and nightly). AS DIRECTED 1 kit 0   ??? albuterol 2.5 mg /3 mL (0.083 %) nebulizer solution Inhale 3 mL (2.5 mg total) by nebulization every six (6) hours as needed for wheezing or shortness of breath (airway clearance/cough). 120 mL 0   ??? albuterol HFA 90 mcg/actuation inhaler Inhale 2 puffs every four (4) hours as needed for wheezing or shortness of breath. 8.5 g 0   ??? blood sugar diagnostic Strp by Other route Four (4) times a day (before meals and nightly). 400 strip 1   ??? budesonide-formoteroL (SYMBICORT) 160-4.5 mcg/actuation inhaler Inhale 2 puffs Two (2) times a day. 20 g 2   ??? cyclobenzaprine (FLEXERIL) 10 MG tablet Take 1 tablet (10 mg total) by mouth Three (3) times a day as needed. 90 tablet 0   ??? empty container Misc USE AS DIRECTED 1 each 2   ??? EPINEPHrine (EPIPEN 2-PAK) 0.3 mg/0.3 mL injection Inject 0.3 mL (0.3 mg total) into the muscle once for 1 dose. 1 Device 1   ??? gabapentin (NEURONTIN) 300 MG capsule Take 1 capsule (300 mg total) by mouth Three (3) times a day. 270 capsule 1   ??? hydroCHLOROthiazide (HYDRODIURIL) 25 MG tablet Take 1 tablet (25 mg total) by mouth daily. TAKE ONE (1) TABLET BY MOUTH EVERY DAY 90 tablet 1   ??? insulin ASPART (NOVOLOG U-100 INSULIN ASPART) 100 unit/mL injection Take 1-20 units SQ QID AC and HS as directed 80 mL 1   ??? insulin glargine (BASAGLAR, LANTUS) 100 unit/mL (3 mL) injection pen 18 units 30 pen 3   ??? ipratropium (ATROVENT) 0.02 % nebulizer solution Inhale 2.5 mL (500 mcg total) by nebulization Four (4) times a day. 300 mL 3   ??? MEDICAL SUPPLY ITEM Accucheck Glide Glocumeter for home glucose checks. Check blood glucose ACHS. 1 Package 0   ??? mepolizumab 100 mg/mL AtIn Inject 100 mg under the skin every twenty-eight (28) days. 1 mL 12   ??? metFORMIN (GLUCOPHAGE) 1000 MG tablet Take 1 tablet (1,000 mg total) by mouth 2 (two) times a day with meals. 180 tablet 1   ??? miscellaneous medical supply Misc Nebulizer machine and tubing. Patients machine is 56 years old. 1 each 0   ??? OXYGEN-AIR DELIVERY SYSTEMS MISC Dose: 2-3 LPM     ??? posaconazole (NOXAFIL) 100 mg TbEC delayed released tablet Take 200 mg by mouth daily. 60 tablet 2   ??? pravastatin (PRAVACHOL) 40 MG tablet Take 1 tablet (40 mg total) by mouth daily with evening meal. 90 tablet 1   ??? predniSONE (DELTASONE) 5 MG tablet Take 2 tablets (10 mg total) by mouth daily. 60 tablet 1   ??? sodium chloride 7% 7 % Nebu Inhale 1 vial (4 mL) by nebulization Two (2) times a day. 720 mL 3     No facility-administered encounter medications on file as of 05/13/2019.        ALLERGIES:  Allergies as of 05/13/2019   ??? (No Known Allergies)       PHYSICAL EXAM:     N/A      DIAGNOSTIC REVIEW:     All imaging and labs were reviewed personally.      Echo 12/30/18:  1. The left ventricle is normal in size with mildly increased wall  thickness.    2. Normal left ventricular systolic function, ejection  fraction 60-65%.    3. Diastolic dysfunction - grade I (normal filling pressures).    4. Aortic sclerosis.    5. Normal right ventricular size and systolic function.    6. Pulmonary systolic pressure cannot be estimated due to insufficient TR  jet.    PET scan 12/09/18  - Metabolically active fibrosis and mediastinal and supraclavicular nodes likely reflect active disease; overall level of avidity is moderate, with uptake similar to liver. Appearance of CT is mildly improved from prior, although substantial fibrosis remains.    CTA PE Protocol 02/13/18:  No pulmonary embolism or acute airspace disease.    Since prior study dated 09/26/2016, there has been progressive mediastinal lymphadenopathy, and pulmonary fibrosis, related to end-stage sarcoidosis.   ??  Dilated main pulmonary artery  as can be seen with pulmonary hypertension.      EKG 02/13/18  SINUS TACHYCARDIA WITH 1ST DEGREE AV BLOCK  PULMONARY DISEASE PATTERN  LEFT ANTERIOR FASCICULAR BLOCK  NONSPECIFIC T WAVE ABNORMALITY  ABNORMAL ECG  WHEN COMPARED WITH ECG OF 14-Dec-2017 13:05,  PREMATURE VENTRICULAR BEATS ARE NO LONGER PRESENT  QRS AXIS SHIFTED LEFT  Confirmed by Warnell Forester ([id number]) on 02/13/2018 10:03:18 PM    LFTs  Lab Results   Component Value Date    ALKPHOS 73 08/28/2018    BILITOT 0.4 08/28/2018    BILIDIR <0.10 12/14/2017    PROT 7.8 08/28/2018    ALBUMIN 4.0 08/28/2018    ALT 11 08/28/2018 AST 15 08/28/2018      Calcium   Date Value Ref Range Status   04/29/2019 9.4 8.5 - 10.2 mg/dL Final      Echocardiography 12/30/2018    1. The left ventricle is normal in size with mildly increased wall  thickness.    2. Normal left ventricular systolic function, ejection fraction 60-65%.    3. Diastolic dysfunction - grade I (normal filling pressures).    4. Aortic sclerosis.    5. Normal right ventricular size and systolic function.    6. Pulmonary systolic pressure cannot be estimated due to insufficient TR  Jet.    Right Heart Catheterization 04/29/19  ??  ?? Pressures   Right atrium Mean 5 mm Hg   Right ventricle Systolic 57 mm Hg, End-diastolic 8 mm Hg   Pulmonary artery  Systolic 55 mm Hg, Diastolic 17 mm Hg, Mean 33 mm Hg   Pulmonary capillary wedge Mean 12 mm Hg   ??  Arterial saturation: 89% (pulse oximetry)  Mixed venous saturation: 50%  ??  Fick cardiac output: 4.9 L/min  Fick cardiac index: 2.1 L/min/m^2  ??  Thermal cardiac output: 5.1 L/min  Thermal cardiac index: 2.3 L/min/m^2  ??  Systemic vascular resistance (SVR): 1616.33 dynes*s/cm^5  Pulmonary vascular resistance (PVR): 4.29 Wood units      ASSESSMENT AND PLAN:     [name] is a pleasant 54 y.o. African-American woman with history of sarcoidosis with obstructive and restrictive lung disease and aspergillus s/p LUL lobectomy with recurrence in 08/2016, now on chronic posaconazole whom we are seeing virtually for follow-up management of the same. Currently, on prednisone 10 mg since her last hospitalization 02/13/18-02/18/18.    Her symptoms improved previously with Augmentin, airway clearance and prednisone. CTA negative for PE but increased lymphadenopathy and potentially worsening parenchymal disease from prior although interpretation is limited by artifact and PE protocol study. Echo with normal cardiac function and no evidence of elevated PASP or right heart disease but grade 1 diastolic dysfunction.  Continues  to have dyspnea on exertion despite being on prednisone for 8 weeks.  Etiology of her symptoms is likely active pulmonary sarcoidosis given prior CT findings and PET scan. Other potential causes include infection or possibly related to her underlying reactive airway disease.  Pulmonary hypertension is also a consideration with a potential pulmonary artery stenosis from prior PA plasty during her left upper lobectomy in 2016 or extrinsic compression by enlarged LNs.     She also had NTM AFB in prior sputum (M. Fortuitum). This has grown twice previously from expectorated sputum. Unlikely to represent pathogen currently given that current symptoms improved with Prednisone and abx. Repeat AFB from recently hospitalization remain NGTD although are not finalized currently. Will consider treating this if symptoms persist.    I suspect bronchiectasis has a central role in her symptoms especially that she was not using any airway clearance measure until recently. Improvement previously with airway clearance and abx (Augmentin) further supports this (ie, suggests contribution from bacterial infection). We discussed at length the importance of using albuterol, HTS, and her airway clearance device.    I encouraged her to continue aggressive airway clearance. IAsthma possibly contributing as well and she previously was on Nucala. She is currently using Advair as needed while on albuterol neb q6 hrs. We discussed using Advair regularily to facilitate weaning off prednisone..     Her pulmonary sarcoidosis is challenging. There is evidence of a ctive parenchymal sarcoidosis on most recent PET scan. However, intensifying immunosuppressive therapy will be challenging given active infection (invasive aspergillosis). There will be a risk of activating mycobacterial disease as well. If we decide to increase her steroids or add a steroid-sparing therapy, this will have to be well coordinated with our ID colleagues. I discussed enrolling her in the CLEAR therapy trial at [facility] (anti-mycobacterial therapy) as there is some evidence that treating mycobacterial Ags may restore T cell function and improve sarcoidosis. She is agreeing to this and I contacted the PI at [facility]. Unfortunately, they completed enrolling patients.     At this time, we discussed getting CPET to further investigate her DOE. I also still worry about PH given enlarged PA on CT and suggested RHC. She agreed to both.      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  We will work to obtain portable oxygen concentrator versus portable noninvasive oxygen supplementation and positive airway pressure.  -- Annual spirometry, DLCO   -- Annual ophthalmology and EKG (if abnormal EKG or cardiac symptoms, will proceed to cardiac MRI)  -- She has been referred for evaluation of pulmonary transplant in the past however is not a candidate due to prior PA plasty which precludes her ability to undergo the surgery for lung transplantation her thoracic surgery  -- Continue Life 2000 home ventilator.  -- Will obtain CPET     Bronchiectasis:  -- Encouraged to use of airway clearance device, aiming for twice daily and can pre-treat with Albuterol to improve clearance  -- Continue  albuterol, ipratropium, and hypertonic saline 7% twice daily for airway clearance    Chronic Invasive Aspergillosis: s/p LUL lobectomy. Recurrence in 08/2016  -- Currently on posaconazole (changed in January 2020 from voriconazole per ID  recommendations).   -- Continue to follow with ID.  -- Fungal sputum culture, lower respiratory culture and AFB today    Asthma:  -- Continue Symbicort inh (asked to use it twice daily) and Nucala    NTM AFB from prior sputum (M. Fortuitum), likely represents colonization/contamination. Has not grown on most recent AFB cultures. No indication for treatment currently.  --Repeat AFB sputum culture every visit    OSA symptoms:  -- Sleep study     Health maintenance:  -- Vaccines: up-to-date  -- Smoking cessation: N/A (lifelong non-smoker)  -- Lung cancer screening: N/A      RTC as previously scheduled in 3 months     Alaina Donati O. Wellington Hampshire, MD   Assistant Professor of Medicine  Pulmonary and Critical Care Medicine  [facility]monary Hypertension [facility]    I spent 27 minutes on the phone visit with the patient. I spent an additional 10 minutes on pre- and post-visit activities.     The patient was not located and I was located within 250 yards of a hospital based location during the phone visit. The patient was physically located in [place] or a state in which I am permitted to provide care. The patient and/or parent/guardian understood that s/he may incur co-pays and cost sharing, and agreed to the telemedicine visit. The visit was reasonable and appropriate under the circumstances given the patient's presentation at the time.    The patient and/or parent/guardian has been advised of the potential risks and limitations of this mode of treatment (including, but not limited to, the absence of in-person examination) and has agreed to be treated using telemedicine. The patient's/patient's family's questions regarding telemedicine have been answered.    If the visit was completed in an ambulatory setting, the patient and/or parent/guardian has also been advised to contact their provider???s office for worsening conditions, and seek emergency medical treatment and/or call 911 if the patient deems either necessary.

## 2019-05-15 MED FILL — NUCALA 100 MG/ML SUBCUTANEOUS AUTO-INJECTOR: SUBCUTANEOUS | 28 days supply | Qty: 1 | Fill #10

## 2019-05-15 MED FILL — NUCALA 100 MG/ML SUBCUTANEOUS AUTO-INJECTOR: 28 days supply | Qty: 1 | Fill #10 | Status: AC

## 2019-05-15 NOTE — Unmapped (Deleted)
Assessment and Plan:     There are no diagnoses linked to this encounter.    HPI:      Karen Barber  is here for No chief complaint on file.    {kmchronicdx:61999}    {kmacutedx:61998}     [facility] Components:     Goals     ??? Get out more often and participate in more activities      Pt wanting to get my energy level back by getting out more and doing more to prevent feeling depressed.            Medication adherence and barriers to the treatment plan have been addressed. Opportunities to optimize healthy behaviors have been discussed. Patient / caregiver voiced understanding.      Past Medical/Surgical History:     Past Medical History:   Diagnosis Date   ??? 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 Breast injury     lung surg on left incision under breast sept 2016   ??? Hepatitis C antibody test positive 2016    repeatedly negative HCV RNA, indicating clearance of infection w/o treatment   ??? Hyperlipidemia    ??? Hypertension    ??? 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 On home O2 2008    per Dr Mal Amabile note from 02/21/2017   ??? Pulmonary aspergillosis (CMS-HCC) 2016    A.fumigatus in 2016, prior to LUL lobectomy. A.niger from sputum 08/2016-09/2016.   ??? 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 Sarcoidosis 1995   ??? Type 2 diabetes mellitus with hyperglycemia (CMS-HCC) 07/27/2014     Past Surgical History:   Procedure Laterality Date   ??? LUNG LOBECTOMY     ??? PR RIGHT HEART CATH O2 SATURATION & CARDIAC OUTPUT N/A 04/29/2019    Procedure: Right Heart Catheterization;  Surgeon: Rosana Hoes, MD;  Location: [facility] CATH;  Service: Cardiology   ??? PR THORACOSCOPY SURG TOT PULM DECORT Left 12/14/2014    Procedure: THORACOSCOPY SURG; W/TOT PULM DECORTIC/PNEUMOLYS;  Surgeon: Evert Kohl, MD;  Location: MAIN OR [facility];  Service: Cardiothoracic   ??? PR THORACOSCOPY W/THERA WEDGE RESEXN INITIAL UNILAT Left 12/03/2014    Procedure: THORACOSCOPY, SURGICAL; WITH THERAPEUTIC WEDGE RESECTION (EG, MASS, NODULE) INITIAL UNILATERAL;  Surgeon: Evert Kohl, MD;  Location: MAIN OR [facility];  Service: Cardiothoracic       Family History:     Family History   Problem Relation Age of Onset   ??? Diabetes Father    ??? Diabetes Brother    ??? Diabetes Maternal Aunt    ??? Sarcoidosis Maternal Aunt    ??? Diabetes Maternal Uncle    ??? Diabetes Paternal Aunt    ??? Diabetes Paternal Uncle        Social History:     Social History     Socioeconomic History   ??? Marital status: Single     Spouse name: Not on file   ??? Number of children: Not on file   ??? Years of education: Not on file   ??? Highest education level: Not on file   Occupational History   ??? Not on file   Social Needs   ??? Financial resource strain: Not on file   ??? Food insecurity     Worry: Not on file     Inability: Not on file   ??? Transportation  needs     Medical: Not on file     Non-medical: Not on file   Tobacco Use   ??? Smoking status: Former Smoker     Packs/day: 0.50     Years: 9.00     Pack years: 4.50     Types: Cigarettes     Quit date: 05/03/1987     Years since quitting: 32.0   ??? Smokeless tobacco: Never Used   Substance and Sexual Activity   ??? Alcohol use: No     Alcohol/week: 0.0 standard drinks   ??? Drug use: No   ??? Sexual activity: Yes   Lifestyle   ??? Physical activity     Days per week: Not on file     Minutes per session: Not on file   ??? Stress: Not on file   Relationships   ??? Social Wellsite geologist on phone: Not on file     Gets together: Not on file     Attends religious service: Not on file     Active member of club or organization: Not on file     Attends meetings of clubs or organizations: Not on file     Relationship status: Not on file   Other Topics Concern   ??? Do you use sunscreen? No   ??? Tanning bed use? No   ??? Are you easily burned? No   ??? Excessive sun exposure? No   ??? Blistering sunburns? No   Social History Narrative   ??? Not on file Allergies:     Patient has no known allergies.    Current Medications:     Current Outpatient Medications   Medication Sig Dispense Refill   ??? ACCU-CHEK FASTCLIX LANCET DRUM Misc USE TO CHECK GLUCOSE 4 TIMES DAILY BEFORE MEAL(S) AND NIGHTLY     ??? ACCU-CHEK GUIDE GLUCOSE METER Misc 1 Device by Other route Four (4) times a day (before meals and nightly). AS DIRECTED 1 kit 0   ??? albuterol 2.5 mg /3 mL (0.083 %) nebulizer solution Inhale 3 mL (2.5 mg total) by nebulization every six (6) hours as needed for wheezing or shortness of breath (airway clearance/cough). 120 mL 0   ??? albuterol HFA 90 mcg/actuation inhaler Inhale 2 puffs every four (4) hours as needed for wheezing or shortness of breath. 8.5 g 0   ??? amoxicillin-clavulanate (AUGMENTIN) 875-125 mg per tablet Take 1 tablet by mouth Two (2) times a day for 14 days. 28 tablet 0   ??? blood sugar diagnostic Strp by Other route Four (4) times a day (before meals and nightly). 400 strip 1   ??? budesonide-formoteroL (SYMBICORT) 160-4.5 mcg/actuation inhaler Inhale 2 puffs Two (2) times a day. 20 g 2   ??? cyclobenzaprine (FLEXERIL) 10 MG tablet Take 1 tablet (10 mg total) by mouth Three (3) times a day as needed. 90 tablet 0   ??? empty container Misc USE AS DIRECTED 1 each 2   ??? EPINEPHrine (EPIPEN 2-PAK) 0.3 mg/0.3 mL injection Inject 0.3 mL (0.3 mg total) into the muscle once for 1 dose. 1 Device 1   ??? gabapentin (NEURONTIN) 300 MG capsule Take 1 capsule (300 mg total) by mouth Three (3) times a day. 270 capsule 1   ??? hydroCHLOROthiazide (HYDRODIURIL) 25 MG tablet Take 1 tablet (25 mg total) by mouth daily. TAKE ONE (1) TABLET BY MOUTH EVERY DAY 90 tablet 1   ??? insulin ASPART (NOVOLOG U-100 INSULIN ASPART) 100 unit/mL injection Take  1-20 units SQ QID AC and HS as directed 80 mL 1   ??? insulin glargine (BASAGLAR, LANTUS) 100 unit/mL (3 mL) injection pen 18 units 30 pen 3   ??? ipratropium (ATROVENT) 0.02 % nebulizer solution Inhale 2.5 mL (500 mcg total) by nebulization Four (4) times a day. 300 mL 3   ??? MEDICAL SUPPLY ITEM Accucheck Glide Glocumeter for home glucose checks. Check blood glucose ACHS. 1 Package 0   ??? mepolizumab 100 mg/mL AtIn Inject 100 mg under the skin every twenty-eight (28) days. 1 mL 12   ??? metFORMIN (GLUCOPHAGE) 1000 MG tablet Take 1 tablet (1,000 mg total) by mouth 2 (two) times a day with meals. 180 tablet 1   ??? miscellaneous medical supply Misc Nebulizer machine and tubing. Patients machine is 56 years old. 1 each 0   ??? OXYGEN-AIR DELIVERY SYSTEMS MISC Dose: 2-3 LPM     ??? posaconazole (NOXAFIL) 100 mg TbEC delayed released tablet Take 200 mg by mouth daily. 60 tablet 2   ??? pravastatin (PRAVACHOL) 40 MG tablet Take 1 tablet (40 mg total) by mouth daily with evening meal. 90 tablet 1   ??? predniSONE (DELTASONE) 5 MG tablet Take 2 tablets (10 mg total) by mouth daily. 60 tablet 1   ??? sodium chloride 7% 7 % Nebu Inhale 1 vial (4 mL) by nebulization Two (2) times a day. 720 mL 3     No current facility-administered medications for this visit.        Health Maintenance:     Health Maintenance Summary w/Most Recent Date       Status Date      HPV Cotest with Pap Smear (21-65) Overdue 06/15/1984     Pap Smear with Cotest HPV (21-65) Overdue 02/27/2001      Done 02/28/1996 Registry Metric: Pap Smear date     Done 02/28/1996 PAP SMEAR (HISTORICAL RESULT)    FIT-DNA Stool Test Overdue 06/15/2013     Zoster Vaccines Overdue 06/15/2013     DTaP/Tdap/Td Vaccines Overdue 07/07/2017      Done 07/08/2007 Imm Admin: TdaP    Hemoglobin A1c Overdue 02/01/2019      Done 11/01/2018 Registry Metric: Last Hemoglobin A1c Date     Done 11/01/2018 HEMOGLOBIN A1C Hemoglobin A1C           Done 03/05/2018 HEMOGLOBIN A1C Hemoglobin A1C           Done 11/05/2017 HEMOGLOBIN A1C Hemoglobin A1C           Done 07/03/2017 POCT GLYCOSYLATED HEMOGLOBIN (HGB A1C) HGB A1C, RAP/PDS           Patient has more history with this topic...    Foot Exam Next Due 11/01/2019      Done 11/01/2018 HM DIABETES FOOT EXAM     Done 11/05/2017 HM DIABETES FOOT EXAM     Done 06/14/2016 HM DIABETES FOOT EXAM     Done 11/05/2012 HM DIABETES FOOT EXAM    Urine Albumin/Creatinine Ratio Next Due 11/01/2019      Done 11/01/2018 ALBUMIN / CREATININE URINE RATIO Albumin/Creatinine Ratio           Done 11/05/2017 ALBUMIN / CREATININE URINE RATIO Albumin/Creatinine Ratio           Done 06/14/2016 ALBUMIN / CREATININE URINE RATIO Albumin/Creatinine Ratio          Retinal Eye Exam Next Due 11/15/2019      Done 11/15/2018 HM DIABETES EYE EXAM HM Diabetic Eye Exam  Done 04/12/2016 HM DIABETES EYE EXAM HM Diabetic Eye Exam           Done 06/18/2014 SmartData: OPHTH FUNDUS OD PERIPHERY     Done 06/18/2014 SmartData: OPHTH FUNDUS OS PERIPHERY     Done 06/18/2014 SmartData: FINDINGS - PE - EYES - FUNDUSCOPIC - PERIPHERY - LEFT PERIPHERY NORMAL    Mammogram Start Age 29 Next Due 02/29/2020      Done 02/28/2018 MAMMO SCREENING BILATERAL     Done 03/16/2015 MAMMO SCREENING BILATERAL    Serum Creatinine Monitoring Next Due 04/28/2020      Done 04/29/2019 Registry Metric: Serum creatinine     Done 08/02/2016 Ext Proc: CHG BASIC METABOLIC PANEL CALCIUM TOTAL     Done 04/29/2019 BASIC METABOLIC PANEL Creatinine           Done 08/28/2018 COMPREHENSIVE METABOLIC PANEL Creatinine           Done 05/22/2018 COMPREHENSIVE METABOLIC PANEL Creatinine           Patient has more history with this topic...    Potassium Monitoring Next Due 04/28/2020      Done 04/29/2019 Registry Metric: Potassium     Done 08/02/2016 Ext Proc: CHG BASIC METABOLIC PANEL CALCIUM TOTAL     Done 04/29/2019 BASIC METABOLIC PANEL Potassium           Done 08/28/2018 COMPREHENSIVE METABOLIC PANEL Potassium           Done 05/22/2018 COMPREHENSIVE METABOLIC PANEL Potassium           Patient has more history with this topic...    COPD Spirometry Next Due 01/04/2022      Done 01/04/2017 FLOW VOLUME LOOP     Done 12/16/2015 SPIROMETRY PRE / POST     Done 11/20/2014 FLOW VOLUME LOOP     Done 06/25/2013 SPIROMETRY    Pneumococcal Vaccine This plan is no longer active.      Done 01/03/2017 Imm Admin: PNEUMOCOCCAL POLYSACCHARIDE 23     Done 07/08/2007 Imm Admin: PNEUMOCOCCAL POLYSACCHARIDE 23    Hepatitis C Screen This plan is no longer active.      Done 01/08/2019 HEPATITIS C RNA, QUANTITATIVE, PCR     Done 11/24/2014 HEPATITIS C ANTIBODY Hepatitis C Ab           Done 11/18/2014 HEPATITIS C RNA, QUANTITATIVE, PCR     Done 11/17/2014 HEPATITIS C ANTIBODY Hepatitis C Ab           Done 11/02/2014 HEPATITIS C RNA, QUANTITATIVE, PCR     Patient has more history with this topic...    Influenza Vaccine This plan is no longer active.      Done 01/08/2019 Imm Admin: Influenza Vaccine Quad (IIV4 PF) 41mo+ injectable     Done 12/06/2017 Imm Admin: Influenza Vaccine Quad (IIV4 PF) 78mo+ injectable     Done 01/03/2017 Imm Admin: Influenza Vaccine Quad (IIV4 PF) 89mo+ injectable     Done 12/16/2015 Imm Admin: Influenza Vaccine Quad (IIV4 PF) 25mo+ injectable     Done 12/19/2014 Imm Admin: Influenza Vaccine Quad (IIV4 PF) 31mo+ injectable     Patient has more history with this topic...          Immunizations:     Immunization History   Administered Date(s) Administered   ??? INFLUENZA TIV (TRI) PF (IM) 01/02/2005, 12/14/2008   ??? Influenza Vaccine Quad (IIV4 PF) 6mo+ injectable 11/28/2012, 12/19/2014, 12/16/2015, 01/03/2017, 12/06/2017, 01/08/2019   ??? PNEUMOCOCCAL POLYSACCHARIDE 23 07/08/2007, 01/03/2017   ??? PPD Test  01/19/2015   ??? TdaP 07/08/2007       I have reviewed and (if needed) updated the patient's problem list, medications, allergies, past medical and surgical history, social and family history.    ROS:      ROS  Comprehensive 10 point ROS negative unless otherwise stated in the HPI.       Vital Signs:     Wt Readings from Last 3 Encounters:   05/13/19 (!) 102.1 kg (225 lb)   04/29/19 (!) 107 kg (236 lb)   01/24/19 (!) 103.4 kg (228 lb)     Temp Readings from Last 3 Encounters:   04/29/19 36.7 ??C (98 ??F) (Oral)   01/24/19 36.6 ??C (97.9 ??F) (Oral)   01/08/19 36.8 ??C (98.2 ??F) (Oral)     BP Readings from Last 3 Encounters:   04/29/19 141/90   01/24/19 152/107   01/08/19 124/82     Pulse Readings from Last 3 Encounters:   04/29/19 90   01/24/19 106   01/08/19 80     Estimated body mass index is 31.38 kg/m?? as calculated from the following:    Height as of 05/13/19: 180.3 cm (5' 11).    Weight as of 05/13/19: 102.1 kg (225 lb).  No height and weight on file for this encounter.        Objective:      General: Alert and oriented x3. Well-appearing. No acute distress. ***  HEENT:  Normocephalic.  Atraumatic. Conjunctiva and sclera normal. OP MMM without lesions. ***  Neck:  Supple. No thyroid enlargement. No adenopathy. ***  Heart:  Regular rate and rhythm . Normal S1, S2.  No murmurs, rubs or gallops. ***  Lungs:  No respiratory distress.  Lungs clear to auscultation. No wheezes, rhonchi, or rales. ***  GI/GU:  Soft, +BS, nondistended, non-TTP. No palpable masses or organomegaly. ***  MSK: Gait and station unremarkable. Normal ROM major joints. Normal strength and tone of proximal muscles.***   Extremities:  No edema. Peripheral pulses normal. ***  Skin:  Warm, dry. No rash or lesions present. ***  Neuro:  Non-focal. No obvious weakness. ***  Psych:  Affect normal, eye contact good, speech clear and coherent. ***     I attest that I, Bayard Hugger, personally documented this note while acting as scribe for Noralyn Pick, FNP.      Bayard Hugger, Scribe.  05/19/2019     The documentation recorded by the scribe accurately reflects the service I personally performed and the decisions made by me.    Noralyn Pick, FNP

## 2019-05-16 MED ORDER — SILDENAFIL (PULMONARY HYPERTENSION) 20 MG TABLET
ORAL_TABLET | Freq: Three times a day (TID) | ORAL | 11 refills | 30.00000 days | Status: CP
Start: 2019-05-16 — End: ?

## 2019-05-16 NOTE — Unmapped (Signed)
Sent escript to [facility] for new start Sildenafil 20 mg TID. Demographics sent to [place]. Notified her that new script was sent.

## 2019-05-20 NOTE — Unmapped (Signed)
I received notification on CMM of sildenafil approval from 03/21/2019-05/18/2020. [id number]. I notified [facility] SP.

## 2019-05-20 NOTE — Unmapped (Signed)
I completed the sildenafil PA on CMM.

## 2019-05-23 NOTE — Unmapped (Signed)
Assessment and Plan:     Karen Barber was seen today for diabetes.    Diagnoses and all orders for this visit:    Type 2 diabetes mellitus with hyperglycemia, with long-term current use of insulin (CMS-HCC)  HGB A1c 9.7 (slightly down from 9.8 six months ago).   DM not well controlled, complicated by prednisone.   Recommend increasing insulin. Patient declines.   Continue insulin aspart with sliding scale dosage (1-20 units) QID AC and HS, insulin glargine 18 units daily, and metformin 1000 mg BID.  Reinforced carb controlled diet.   Patient has follow up with ID specialist tomorrow. Anticipate increasing insulin dosage if patient remains on prednisone.   -     POCT glycosylated hemoglobin (Hb A1C)    Tinea pedis of right foot  Rx ketoconazole BID for 2-3 weeks. Expect posaconazole to cover as well.   -     ketoconazole (NIZORAL) 2 % cream; Apply 1 application topically Two (2) times a day. Use for 2-3 weeks.     Counseled on strategies for stress management/reduction.     HPI:      Karen Barber  is here for   Chief Complaint   Patient presents with   ??? Diabetes     f/u     Diabetes: Patient presents for follow up of diabetes.  A1C goal is <8.  Diabetes has customarily not been at goal (complicated by: prednisone).  Current symptoms include: headache, fatigue. Symptoms have gradually improved. Patient denies foot ulcerations, hyperglycemia, hypoglycemia , increased appetite, nausea, paresthesia of the feet, polydipsia, polyuria, visual disturbances, vomiting and weight loss. Evaluation to date has included: hemoglobin A1C.  Home sugars: BGs have been as high as 500 when taking prednisone; BGs well controlled, 140s, when she is not on prednisone. Current treatment: Continued metformin and insulin  which has been not very effective. Patient states she tried to take medications regularly.  Doing regular exercise: no.     Patient is tearful, stating she is currently under a great deal of stress and is not sleeping well. She is taking care of her parents nearly on her own. Her parents both struggle with memory loss. She states I have to watch them like they are children. Mother has dementia and has frequent mood swings, as well as changes in her personality. She also notes her brother recently died of COVID in 03/21/2019.     She reports intermittent raw patch between her right 4th and 5th toes. Symptoms started 6 months ago. She states when I sweat it looks like a hole. Therapies tried: OTC antibiotic ointment and hydrogen peroxide, without significant improvement.     Sarcoidosis/Pulmonary HTN: Patient has continued following with pulmonology. Last visit 05/13/19. Pulmonologist recently initiated sildenafil 10 mg TID for pulmonary hypertension. She has continued prednisone (for past 1 month), home oxygen (6L, per chart review), Symbicort, Atrovent, and albuterol.     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 She was started on posaconazole in 03/2018. She has follow up with specialist tomorrow. Patient wishes to defer labs until she sees specialist (future lab orders in chart from February).        [facility] Components:     Goals     ??? Get out more often and participate in more activities      Pt wanting to get my energy level back by getting out more and doing more to prevent feeling depressed.  Medication adherence and barriers to the treatment plan have been addressed. Opportunities to optimize healthy behaviors have been discussed. Patient / caregiver voiced understanding.      Past Medical/Surgical History:     Past Medical History:   Diagnosis Date   ??? 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 Breast injury     lung surg on left incision under breast sept 2016   ??? Hepatitis C antibody test positive 2016    repeatedly negative HCV RNA, indicating clearance of infection w/o treatment   ??? Hyperlipidemia    ??? Hypertension    ??? 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 On home O2 2008    per Dr Mal Amabile note from 02/21/2017   ??? Pulmonary aspergillosis (CMS-HCC) 2016    A.fumigatus in 2016, prior to LUL lobectomy. A.niger from sputum 08/2016-09/2016.   ??? 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 Sarcoidosis 1995   ??? Type 2 diabetes mellitus with hyperglycemia (CMS-HCC) 07/27/2014     Past Surgical History:   Procedure Laterality Date   ??? LUNG LOBECTOMY     ??? PR RIGHT HEART CATH O2 SATURATION & CARDIAC OUTPUT N/A 04/29/2019    Procedure: Right Heart Catheterization;  Surgeon: Rosana Hoes, MD;  Location: [facility] C[facility];  Service: Cardiology   ??? PR THORACOSCOPY SURG TOT PULM DECORT Left 12/14/2014    Procedure: THORACOSCOPY SURG; W/TOT PULM DECORTIC/PNEUMOLYS;  Surgeon: Evert Kohl, MD;  Location: MAIN OR [facility];  Service: Cardiothoracic   ??? PR THORACOSCOPY W/THERA WEDGE RESEXN INITIAL UNILAT Left 12/03/2014    Procedure: THORACOSCOPY, SURGICAL; WITH THERAPEUTIC WEDGE RESECTION (EG, MASS, NODULE) INITIAL UNILATERAL;  Surgeon: Evert Kohl, MD;  Location: MAIN OR [facility];  Service: Cardiothoracic       Family History:     Family History   Problem Relation Age of Onset   ??? Diabetes Father    ??? Diabetes Brother    ??? Diabetes Maternal Aunt    ??? Sarcoidosis Maternal Aunt    ??? Diabetes Maternal Uncle    ??? Diabetes Paternal Aunt    ??? Diabetes Paternal Uncle        Social History:     Social History     Socioeconomic History   ??? Marital status: Single     Spouse name: None   ??? Number of children: None   ??? Years of education: None   ??? Highest education level: None   Occupational History   ??? None   Social Needs   ??? Financial resource strain: None   ??? Food insecurity     Worry: None     Inability: None   ??? Transportation needs     Medical: None     Non-medical: None   Tobacco Use   ??? Smoking status: Former Smoker     Packs/day: 0.50     Years: 9.00     Pack years: 4.50     Types: Cigarettes     Quit date: 05/03/1987     Years since quitting: 32.0   ??? Smokeless tobacco: Never Used   Substance and Sexual Activity   ??? Alcohol use: No     Alcohol/week: 0.0 standard drinks   ??? Drug use: No   ??? Sexual activity: Yes   Lifestyle   ??? Physical activity     Days per week: None  Minutes per session: None   ??? Stress: None   Relationships   ??? Social Wellsite geologist on phone: None     Gets together: None     Attends religious service: None     Active member of club or organization: None     Attends meetings of clubs or organizations: None     Relationship status: None   Other Topics Concern   ??? Do you use sunscreen? No   ??? Tanning bed use? No   ??? Are you easily burned? No   ??? Excessive sun exposure? No   ??? Blistering sunburns? No   Social History Narrative   ??? None       Allergies:     Patient has no known allergies.    Current Medications:     Current Outpatient Medications   Medication Sig Dispense Refill   ??? ACCU-CHEK FASTCLIX LANCET DRUM Misc USE TO CHECK GLUCOSE 4 TIMES DAILY BEFORE MEAL(S) AND NIGHTLY     ??? ACCU-CHEK GUIDE GLUCOSE METER Misc 1 Device by Other route Four (4) times a day (before meals and nightly). AS DIRECTED 1 kit 0   ??? albuterol 2.5 mg /3 mL (0.083 %) nebulizer solution Inhale 3 mL (2.5 mg total) by nebulization every six (6) hours as needed for wheezing or shortness of breath (airway clearance/cough). 120 mL 0   ??? albuterol HFA 90 mcg/actuation inhaler Inhale 2 puffs every four (4) hours as needed for wheezing or shortness of breath. 8.5 g 0   ??? blood sugar diagnostic Strp by Other route Four (4) times a day (before meals and nightly). 400 strip 1   ??? budesonide-formoteroL (SYMBICORT) 160-4.5 mcg/actuation inhaler Inhale 2 puffs Two (2) times a day. 20 g 2   ??? cyclobenzaprine (FLEXERIL) 10 MG tablet Take 1 tablet (10 mg total) by mouth Three (3) times a day as needed. 90 tablet 0   ??? empty container Misc USE AS DIRECTED 1 each 2   ??? EPINEPHrine (EPIPEN 2-PAK) 0.3 mg/0.3 mL injection Inject 0.3 mL (0.3 mg total) into the muscle once for 1 dose. 1 Device 1   ??? gabapentin (NEURONTIN) 300 MG capsule Take 1 capsule (300 mg total) by mouth Three (3) times a day. 270 capsule 1   ??? hydroCHLOROthiazide (HYDRODIURIL) 25 MG tablet Take 1 tablet (25 mg total) by mouth daily. TAKE ONE (1) TABLET BY MOUTH EVERY DAY 90 tablet 1   ??? insulin ASPART (NOVOLOG U-100 INSULIN ASPART) 100 unit/mL injection Take 1-20 units SQ QID AC and HS as directed 80 mL 1   ??? insulin glargine (BASAGLAR, LANTUS) 100 unit/mL (3 mL) injection pen 18 units 30 pen 3   ??? ipratropium (ATROVENT) 0.02 % nebulizer solution Inhale 2.5 mL (500 mcg total) by nebulization Four (4) times a day. 300 mL 3   ??? MEDICAL SUPPLY ITEM Accucheck Glide Glocumeter for home glucose checks. Check blood glucose ACHS. 1 Package 0   ??? mepolizumab 100 mg/mL AtIn Inject 100 mg under the skin every twenty-eight (28) days. 1 mL 12   ??? metFORMIN (GLUCOPHAGE) 1000 MG tablet Take 1 tablet (1,000 mg total) by mouth 2 (two) times a day with meals. 180 tablet 1   ??? miscellaneous medical supply Misc Nebulizer machine and tubing. Patients machine is 56 years old. 1 each 0   ??? OXYGEN-AIR DELIVERY SYSTEMS MISC Dose: 2-3 LPM     ??? posaconazole (NOXAFIL) 100 mg TbEC delayed released tablet Take 200  mg by mouth daily. 60 tablet 2   ??? pravastatin (PRAVACHOL) 40 MG tablet Take 1 tablet (40 mg total) by mouth daily with evening meal. 90 tablet 1   ??? predniSONE (DELTASONE) 5 MG tablet Take 2 tablets (10 mg total) by mouth daily. 60 tablet 1   ??? sodium chloride 7% 7 % Nebu Inhale 1 vial (4 mL) by nebulization Two (2) times a day. 720 mL 3   ??? ketoconazole (NIZORAL) 2 % cream Apply 1 application topically Two (2) times a day. Use for 2-3 weeks. 30 g 11   ??? sildenafiL, pulm.hypertension, (REVATIO) 20 mg tablet Take 1 tablet (20 mg total) by mouth Three (3) times a day. 90 tablet 11     No current facility-administered medications for this visit. Health Maintenance:     Health Maintenance Summary w/Most Recent Date       Status Date      HPV Cotest with Pap Smear (21-65) Overdue 06/15/1984     Pap Smear with Cotest HPV (21-65) Overdue 02/27/2001      Done 02/28/1996 Registry Metric: Pap Smear date     Done 02/28/1996 PAP SMEAR (HISTORICAL RESULT)    FIT-DNA Stool Test Overdue 06/15/2013     Zoster Vaccines Overdue 06/15/2013     DTaP/Tdap/Td Vaccines Overdue 07/07/2017      Done 07/08/2007 Imm Admin: TdaP    Hemoglobin A1c Next Due 08/29/2019      Done 05/29/2019 Registry Metric: Last Hemoglobin A1c Date     Done 05/29/2019 POCT GLYCOSYLATED HEMOGLOBIN (HGB A1C) HGB A1C, RAP/PDS           Done 11/01/2018 HEMOGLOBIN A1C Hemoglobin A1C           Done 03/05/2018 HEMOGLOBIN A1C Hemoglobin A1C           Done 11/05/2017 HEMOGLOBIN A1C Hemoglobin A1C           Patient has more history with this topic...    Foot Exam Next Due 11/01/2019      Done 11/01/2018 HM DIABETES FOOT EXAM     Done 11/05/2017 HM DIABETES FOOT EXAM     Done 06/14/2016 HM DIABETES FOOT EXAM     Done 11/05/2012 HM DIABETES FOOT EXAM    Urine Albumin/Creatinine Ratio Next Due 11/01/2019      Done 11/01/2018 ALBUMIN / CREATININE URINE RATIO Albumin/Creatinine Ratio           Done 11/05/2017 ALBUMIN / CREATININE URINE RATIO Albumin/Creatinine Ratio           Done 06/14/2016 ALBUMIN / CREATININE URINE RATIO Albumin/Creatinine Ratio          Retinal Eye Exam Next Due 11/15/2019      Done 11/15/2018 HM DIABETES EYE EXAM HM Diabetic Eye Exam           Done 04/12/2016 HM DIABETES EYE EXAM HM Diabetic Eye Exam           Done 06/18/2014 SmartData: OPHTH FUNDUS OD PERIPHERY     Done 06/18/2014 SmartData: OPHTH FUNDUS OS PERIPHERY     Done 06/18/2014 SmartData: FINDINGS - PE - EYES - FUNDUSCOPIC - PERIPHERY - LEFT PERIPHERY NORMAL    Mammogram Start Age 28 Next Due 02/29/2020      Done 02/28/2018 MAMMO SCREENING BILATERAL     Done 03/16/2015 MAMMO SCREENING BILATERAL    Serum Creatinine Monitoring Next Due 04/28/2020      Done 04/29/2019 Registry Metric: Serum creatinine     Done 08/02/2016 Ext Proc:  CHG BASIC METABOLIC PANEL CALCIUM TOTAL     Done 04/29/2019 BASIC METABOLIC PANEL Creatinine           Done 08/28/2018 COMPREHENSIVE METABOLIC PANEL Creatinine           Done 05/22/2018 COMPREHENSIVE METABOLIC PANEL Creatinine           Patient has more history with this topic...    Potassium Monitoring Next Due 04/28/2020      Done 04/29/2019 Registry Metric: Potassium     Done 08/02/2016 Ext Proc: CHG BASIC METABOLIC PANEL CALCIUM TOTAL     Done 04/29/2019 BASIC METABOLIC PANEL Potassium           Done 08/28/2018 COMPREHENSIVE METABOLIC PANEL Potassium           Done 05/22/2018 COMPREHENSIVE METABOLIC PANEL Potassium           Patient has more history with this topic...    COPD Spirometry Next Due 01/04/2022      Done 01/04/2017 FLOW VOLUME LOOP     Done 12/16/2015 SPIROMETRY PRE / POST     Done 11/20/2014 FLOW VOLUME LOOP     Done 06/25/2013 SPIROMETRY    Pneumococcal Vaccine This plan is no longer active.      Done 01/03/2017 Imm Admin: PNEUMOCOCCAL POLYSACCHARIDE 23     Done 07/08/2007 Imm Admin: PNEUMOCOCCAL POLYSACCHARIDE 23    Hepatitis C Screen This plan is no longer active.      Done 01/08/2019 HEPATITIS C RNA, QUANTITATIVE, PCR     Done 11/24/2014 HEPATITIS C ANTIBODY Hepatitis C Ab           Done 11/18/2014 HEPATITIS C RNA, QUANTITATIVE, PCR     Done 11/17/2014 HEPATITIS C ANTIBODY Hepatitis C Ab           Done 11/02/2014 HEPATITIS C RNA, QUANTITATIVE, PCR     Patient has more history with this topic...    Influenza Vaccine This plan is no longer active.      Done 01/08/2019 Imm Admin: Influenza Vaccine Quad (IIV4 PF) 20mo+ injectable     Done 12/06/2017 Imm Admin: Influenza Vaccine Quad (IIV4 PF) 15mo+ injectable     Done 01/03/2017 Imm Admin: Influenza Vaccine Quad (IIV4 PF) 52mo+ injectable     Done 12/16/2015 Imm Admin: Influenza Vaccine Quad (IIV4 PF) 26mo+ injectable     Done 12/19/2014 Imm Admin: Influenza Vaccine Quad (IIV4 PF) 40mo+ injectable     Patient has more history with this topic...          Immunizations:     Immunization History   Administered Date(s) Administered   ??? INFLUENZA TIV (TRI) PF (IM) 01/02/2005, 12/14/2008   ??? Influenza Vaccine Quad (IIV4 PF) 60mo+ injectable 11/28/2012, 12/19/2014, 12/16/2015, 01/03/2017, 12/06/2017, 01/08/2019   ??? PNEUMOCOCCAL POLYSACCHARIDE 23 07/08/2007, 01/03/2017   ??? PPD Test 01/19/2015   ??? TdaP 07/08/2007       I have reviewed and (if needed) updated the patient's problem list, medications, allergies, past medical and surgical history, social and family history.    ROS:      ROS  Comprehensive 10 point ROS negative unless otherwise stated in the HPI.       Vital Signs:     Wt Readings from Last 3 Encounters:   05/29/19 (!) 109.8 kg (242 lb)   05/13/19 (!) 102.1 kg (225 lb)   04/29/19 (!) 107 kg (236 lb)     Temp Readings from Last 3 Encounters:   05/29/19 36.9 ??C (98.4 ??F) (  Oral)   04/29/19 36.7 ??C (98 ??F) (Oral)   01/24/19 36.6 ??C (97.9 ??F) (Oral)     BP Readings from Last 3 Encounters:   05/29/19 116/74   04/29/19 141/90   01/24/19 152/107     Pulse Readings from Last 3 Encounters:   05/29/19 75   04/29/19 90   01/24/19 106     Estimated body mass index is 33.77 kg/m?? as calculated from the following:    Height as of this encounter: 180.3 cm (5' 10.98).    Weight as of this encounter: 109.8 kg (242 lb).  Facility age limit for growth percentiles is 20 years.        Objective:      General: Alert and oriented x3. Well-appearing. No acute distress. Using portable oxygen.   HEENT:  Normocephalic.  Atraumatic. Conjunctiva and sclera normal. OP MMM without lesions.   Neck:  Supple. No thyroid enlargement. No adenopathy.   Heart:  Regular rate and rhythm . Normal S1, S2.  No murmurs, rubs or gallops.   Lungs:  No respiratory distress.  Lungs clear to auscultation. No wheezes, rhonchi, or rales.   GI/GU:  Soft, +BS, nondistended, non-TTP.   Extremities:  No edema. Peripheral pulses normal.   Skin:  Warm, dry. Mild erythema and cracked skin interdigit between right 4th and 5th toes. No drainage or ulceration.   Neuro:  Non-focal. No obvious weakness.   Psych:  Affect tearful at times, eye contact good, speech clear and coherent.      I attest that I, Bayard Hugger, personally documented this note while acting as scribe for Noralyn Pick, FNP.      Bayard Hugger, Scribe.  05/29/2019     The documentation recorded by the scribe accurately reflects the service I personally performed and the decisions made by me.    Noralyn Pick, FNP

## 2019-05-26 NOTE — Unmapped (Signed)
[facility] left a message on the clinic nurse line stating there is a severe interaction between the Sildenafil and another medication the patient is currently taking. They are requesting a call back on how to proceed.    [phone] option 2 then option 1

## 2019-05-27 NOTE — Unmapped (Signed)
Received a message that Posaconazole and Sildenafil could have a severe interaction. Notified Dr. Wellington Hampshire and we will change starting dose to Sildenafil 10 mg TID. Notified [facility].

## 2019-05-29 ENCOUNTER — Ambulatory Visit: Admit: 2019-05-29 | Discharge: 2019-05-30 | Payer: MEDICARE | Attending: Family | Primary: Family

## 2019-05-29 DIAGNOSIS — E1165 Type 2 diabetes mellitus with hyperglycemia: Principal | ICD-10-CM

## 2019-05-29 DIAGNOSIS — B353 Tinea pedis: Principal | ICD-10-CM

## 2019-05-29 DIAGNOSIS — Z794 Long term (current) use of insulin: Secondary | ICD-10-CM

## 2019-05-29 MED ORDER — KETOCONAZOLE 2 % TOPICAL CREAM
Freq: Two times a day (BID) | TOPICAL | 11 refills | 30 days | Status: CP
Start: 2019-05-29 — End: 2020-05-28

## 2019-05-29 NOTE — Unmapped (Signed)
Here is your personalized prevention plan based on your Annual Wellness Visit today.    Medicare Screening & Prevention Guidelines Recommendations Last Date Completed HM Status and Next Due Follow-Up   Colorectal Cancer Screening Patients 50 to 75: stool cards annually OR colonoscopy every 10 years (or more frequently if high risk) OR FIT-DNA every 3 years.  Colonoscopy date: Not Found  FOBT/FIT date: 07/18/2016  Sigmoidoscopy date: Not Found  FIT-DNA date: Not Found Health Maintenance Summary       Status Date      FIT-DNA Stool Test Overdue 06/15/2013        DUE   DEXA Bone Density Measurement Patients age 5-85 to have a DEXA every 5 years in postmenopausal women, males will defer to PCP. DEXA date: Not Found Health Maintenance Summary     Patient has no health maintenance due at this time       Not within age range   Diabetes Eye Exam Annually if Diabetic. Eye Exam date: 11/15/2018 Health Maintenance Summary       Status Date      Retinal Eye Exam Next Due 11/15/2019      Done 11/15/2018 HM DIABETES EYE EXAM HM Diabetic Eye Exam           Patient has more history with this topic...     Up to date - next due 10/2019   Diabetes Foot Exam Annually if Diabetic. Most Recent Foot Exam Date: 11/01/2018 Health Maintenance Summary       Status Date      Foot Exam Next Due 11/01/2019      Done 11/01/2018 HM DIABETES FOOT EXAM     Patient has more history with this topic...     Up to date - next due 10/2019   Diabetes Urine Albumin/Creatinine Ratio Annually if Diabetic UACR Date: 11/01/2018   Health Maintenance Summary       Status Date      Urine Albumin/Creatinine Ratio Next Due 11/01/2019      Done 11/01/2018 ALBUMIN / CREATININE URINE RATIO Albumin/Creatinine Ratio             Patient has more history with this topic...       Up to date - next due 10/2019   Diabetes Hemoglobin A1c Every 3 or 6 months depending on last result Last Hemoglobin A1c Date: 05/29/2019 Health Maintenance Summary       Status Date      Hemoglobin A1c Next Due 08/29/2019      Done 05/29/2019 Registry Metric: Last Hemoglobin A1c Date     Patient has more history with this topic...     Up to date - next due 08/2019   Heart Disease Screening (fasting lipid panel) Minimum of every 5 years, patients age 49-75,  if no apparent signs or symptoms of heart disease. LDL date: Not Found  Total choleseterol date: Not Found  HDL date: Not Found  Triglycerides date: Not Found Health Maintenance Summary     Patient has no health maintenance due at this time       DUE   Mammogram Screening Age 75-74 every 2 years.  Mammogram date: 02/28/2018 Health Maintenance Summary       Status Date      Mammogram Start Age 11 Next Due 02/29/2020      Done 02/28/2018 MAMMO SCREENING BILATERAL     Patient has more history with this topic...     Up to date - next due 02/2020  Pelvic Exam & Pap Smear Women ages 84 to 28 every 3 years with negative cytology (pap smear)  OR,   women ages 48 to 68 every 5 years if they have had both a negative pap and human papillomavirus (HPV) OR,  Every 3 years if they had a positive HPV result Pap Smear date: 02/28/1996  HPV date: Not Found Health Maintenance Summary       Status Date      HPV Cotest with Pap Smear (21-65) Overdue 06/15/1984     Pap Smear with Cotest HPV (21-65) Overdue 02/27/2001      Done 02/28/1996 Registry Metric: Pap Smear date     Patient has more history with this topic...     DUE   Hepatitis C Screening A one-time screening for HCV infection for adults born between 75 & 1965. HCV screening date: 01/08/2019 Health Maintenance Summary       Status Date      Hepatitis C Screen This plan is no longer active.      Done 01/08/2019 HEPATITIS C RNA, QUANTITATIVE, PCR     Patient has more history with this topic...       Complete   Tdap Every 10 years (will not be covered by Medicare) DTap/Tdap/TD vaccination: 07/08/2007 Health Maintenance Summary       Status Date      DTaP/Tdap/Td Vaccines Overdue 07/07/2017      Done 07/08/2007 Imm Admin: TdaP Provided information on how to obtain at pharmacy(tdap)   Influenza Vaccine Annually  Influenza Vaccination: 01/08/2019   Health Maintenance Summary       Status Date      Influenza Vaccine This plan is no longer active.      Done 01/08/2019 Imm Admin: Influenza Vaccine Quad (IIV4 PF) 17mo+   injectable     Patient has more history with this topic...       Up to date   Prevnar and Pneumovax Vaccines Prevnar given at age 53 and Pneumovax given one year later. These vaccines may be given in a different sequence depending on chronic conditions. (utilize BPA for dosing & administration) Pneumonia vaccination: 01/03/2017   Health Maintenance Summary       Status Date      Pneumococcal Vaccine This plan is no longer active.      Done 01/03/2017 Imm Admin: PNEUMOCOCCAL POLYSACCHARIDE 23     Patient has more history with this topic...       Up to date   Zoster Vaccine Healthy adults 50 years and older receive 2 doses of recombinant zoster vaccine two to six months apart (may not be covered by Medicare).  Zoster vaccination: Not Found Health Maintenance Summary       Status Date      Zoster Vaccines Overdue 06/15/2013      Provided information on how to obtain at pharmacy (shingrix)

## 2019-05-29 NOTE — Unmapped (Signed)
Patient Education        Athlete's Foot: Care Instructions  Your Care Instructions     Athlete's foot is an itchy rash on the foot caused by an infection with a fungus. You can get it by going barefoot in wet public areas, such as swimming pools or locker rooms. Many times there is no clear reason why you get athlete's foot. You can easily treat athlete's foot by putting medicine on your feet for 1 to 6 weeks. In some cases, a doctor may prescribe pills to kill the fungus.  Follow-up care is a key part of your treatment and safety. Be sure to make and go to all appointments, and call your doctor if you are having problems. It's also a good idea to know your test results and keep a list of the medicines you take.  How can you care for yourself at home?  ?? Your doctor may suggest an over-the counter lotion or spray or may prescribe a medicine. Take your medicines exactly as prescribed. Call your doctor if you think you are having a problem with your medicine.  ?? Keep your feet clean and dry.  ?? When you get dressed, put your socks on before your underwear. This can prevent the fungus from spreading from your feet to your groin.  To prevent athlete's foot  ?? Wear flip-flops or other shower sandals in public locker rooms and showers and by the pool.  ?? Dry between your toes after swimming or bathing.  ?? Wear leather shoes or sandals, which let air get to your feet.  ?? Change your socks as needed so your feet stay as dry as possible.  ?? Use antifungal powder on your feet.  When should you call for help?  Watch closely for changes in your health, and be sure to contact your doctor if:  ?? ?? You do not get better as expected.   Where can you learn more?  Go to [facility] at https://myuncchart.org  Select Patient Education under [employer]. Enter M498 in the search box to learn more about Athlete's Foot: Care Instructions.  Current as of: September 19, 2018??????????????????????????????Content Version: 12.8  ?? 2006-2021 [employer].   Care instructions adapted under license by [facility]. If you have questions about a medical condition or this instruction, always ask your healthcare professional. Healthwise, Inc[employer] disclaims any warranty or liability for your use of this information.

## 2019-05-30 ENCOUNTER — Ambulatory Visit: Admit: 2019-05-30 | Discharge: 2019-05-31 | Payer: MEDICARE

## 2019-05-30 DIAGNOSIS — B441 Other pulmonary aspergillosis: Principal | ICD-10-CM

## 2019-05-30 DIAGNOSIS — D860 Sarcoidosis of lung: Principal | ICD-10-CM

## 2019-05-30 LAB — COMPREHENSIVE METABOLIC PANEL
ALBUMIN: 4 g/dL (ref 3.5–5.0)
ALKALINE PHOSPHATASE: 80 U/L (ref 38–126)
ALT (SGPT): 16 U/L (ref <35)
AST (SGOT): 18 U/L (ref 14–38)
BILIRUBIN TOTAL: 0.9 mg/dL (ref 0.0–1.2)
BUN / CREAT RATIO: 20
CALCIUM: 9.4 mg/dL (ref 8.5–10.2)
CO2: 34 mmol/L — ABNORMAL HIGH (ref 22.0–30.0)
CREATININE: 1.2 mg/dL — ABNORMAL HIGH (ref 0.60–1.00)
EGFR CKD-EPI AA FEMALE: 59 mL/min/{1.73_m2} — ABNORMAL LOW (ref >=60)
EGFR CKD-EPI NON-AA FEMALE: 51 mL/min/{1.73_m2} — ABNORMAL LOW (ref >=60)
GLUCOSE RANDOM: 129 mg/dL — ABNORMAL HIGH (ref 70–99)
POTASSIUM: 4.8 mmol/L (ref 3.5–5.0)
PROTEIN TOTAL: 7.2 g/dL (ref 6.5–8.3)
SODIUM: 138 mmol/L (ref 135–145)

## 2019-05-30 LAB — CBC W/ AUTO DIFF
BASOPHILS ABSOLUTE COUNT: 0 10*9/L (ref 0.0–0.1)
BASOPHILS RELATIVE PERCENT: 0.6 %
EOSINOPHILS ABSOLUTE COUNT: 0.1 10*9/L (ref 0.0–0.4)
EOSINOPHILS RELATIVE PERCENT: 1.5 %
HEMATOCRIT: 40.9 % (ref 36.0–46.0)
HEMOGLOBIN: 12.6 g/dL (ref 12.0–16.0)
LARGE UNSTAINED CELLS: 4 % (ref 0–4)
LYMPHOCYTES RELATIVE PERCENT: 20.5 %
MEAN CORPUSCULAR HEMOGLOBIN CONC: 31 g/dL (ref 31.0–37.0)
MEAN CORPUSCULAR HEMOGLOBIN: 26.7 pg (ref 26.0–34.0)
MEAN CORPUSCULAR VOLUME: 86.3 fL (ref 80.0–100.0)
MEAN PLATELET VOLUME: 9.1 fL (ref 7.0–10.0)
MONOCYTES ABSOLUTE COUNT: 0.7 10*9/L (ref 0.2–0.8)
MONOCYTES RELATIVE PERCENT: 10.9 %
NEUTROPHILS ABSOLUTE COUNT: 4.1 10*9/L (ref 2.0–7.5)
NEUTROPHILS RELATIVE PERCENT: 62.9 %
PLATELET COUNT: 256 10*9/L (ref 150–440)
RED CELL DISTRIBUTION WIDTH: 14.1 % (ref 12.0–15.0)
WBC ADJUSTED: 6.5 10*9/L (ref 4.5–11.0)

## 2019-05-30 LAB — BILIRUBIN DIRECT: Bilirubin.glucuronidated+Bilirubin.albumin bound:MCnc:Pt:Ser/Plas:Qn:: <0.1

## 2019-05-30 LAB — LYMPHOCYTES RELATIVE PERCENT: Lymphocytes/100 leukocytes:NFr:Pt:Bld:Qn:Automated count: 20.5

## 2019-05-30 LAB — ANION GAP: Anion gap 3:SCnc:Pt:Ser/Plas:Qn:: 8

## 2019-05-31 NOTE — Unmapped (Signed)
ID Clinic Note - Follow-up Visit    ASSESSMENT AND PLAN:  54yoF w/ sarcoidosis and h/o chronic cavitary pulmonary aspergillosis in 2010 s/p LUL lobectomy w/ VATS on 12/03/14 who had frequent hospitalizations in 2018 for respiratory issues/infections, including in 09/2016 when she was found to have a L apical thick-walled cavitary lesion w/ adjacent lung parenchymal abnormalities and sputum culture positive for Aspergillus [place] and Achromobacter spp. Initially treated with voriconazole for chronic pulmonary aspergillosis. This was transitioned to posaconazole to reduce potential toxicities after discussing risks and benefits of stopping therapy completely as it was unlikely to be curative and she is not a transplant candidate due to anatomical concerns.    Continues to have chronic DOE, cough, and increased baseline O2 requirement. As she did not have a durable response to Nucala, asthma thought to be a less likely contributor. Also found to have pulm HTN on recent RHC - just started sildenafil. PET CT c/w metabolocally active sarcoid, so increased immunosuppression may be something that is needed in the future. Given known chronic aspergillosis, she would certainly be at risk for worsening infection, but is doing well on posaconazole tx. It may be worth the risk to decrease frequency of flares of respiratory sxs - will discuss with Ms. Luz and Dr. Wellington Hampshire should the need for further immunosuppression arise.     Sildenafil dose decreased given interaction with posaconazole. She is due for posaconazole level - will get this at PCP on Monday. Will check LFTs today for monitoring.    Today:  - CONTINUE posaconazole 200mg  daily. Discuss at each visit risks and benefits of trial off of this medication, but elected to continue today as she is starting sildenafil so would like to keep all other variables the same.   - Will check LFTs today  - She will go to PCP office Monday AM to have posaconazole level drawn - ordered today.  - Recommended COVID-19 vaccine - she will get it when she becomes eligible later this month. Her partner who she lives with plans to get it as well.      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 Kearney Hard --> Dr. Wellington Hampshire  - Not a lung transplant candidate 2/2 anatomy after past surgery  - Started sildenafil for [facility] 05/2019  ??  Chronic cavitary pulmonary aspergillosis, first diagnosed 2010  - previously treated with multiple courses of voriconazole  - 2016 cultures with Aspergillus fumigatus  - s/p LUL lobectomy/VATS 12/03/14 after episodes of hemoptysis  - s/p voriconazole 11/20/14-04/2015   - New L apical thick walled cavitary lesion and adjacent lung parenchymal abnormalities 09/08/16 in setting of 1 month of worsening pulmonary??infectious??symptoms, leukocytosis (on steroids)  - 6/22 and 6/23 sputum cultures: Aspergillus Luxem[place], OPF.  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  - 02/13/2018 CT: decreased size of L apical cavity soft tissue density  -  Posaconazole 03/2018- (dose decreased based on levels to 200mg  daily)  ??  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  - S/p 7 week trial of TMP/sulfa + moxifloxacin from 04/2018 - 06/2018 without improvement  ??  Pertinent past Infections  - H/o Achromobacter pneumonia 11/18/14, treated w meropenem x 14 d; 09/25/16, treated with 4 weeks meropenem  - H/o Stenotrophomonas (S: minocycline, I: levoflox R: bactrim, ceftaz)??PNA 12/03/14 treated with minocycline x 14d  - Hepatitis C Ab-positive, VL neg  - Nutritionally variant Strep bacteremia, 06/2014  ??  ??  Education and counseling took 30 minutes of today's visit.  ??  Duanne Limerick, MD, MHS  Fellow, [facility] Division of Infectious Diseases  ??  [facility]   1st floor [facility]  [address]  [place], [place]. [address]   Phone: [phone]   Fax: [phone]   ??  _____________________________________________________________________  ??  ??  CC: F/u of mycetoma, chronic cavitary pulmonary aspergillosis  ??  HPI: ??51yoF w/ h/o sarcoidosis, severe eosinophilic asthma, bronchiectasis, chronic cavitary pulmonary aspergillosis s/p resection in 2016 currently on posaconazole for suppressive therapy, and positive M.fortuitum sputum cultures. Initially had response to Nucala, but only temporarily, so asthma not thought to be driving cause of her chronic symptoms. Based on CT and PET scan findings in 2020, active pulm sarcoid thought to be primary driver of symptoms.    Since last visit in Oct 2020:  - In Oct-Nov had worsening of cough and SOB. Didn't respond to Augmentin + aggressive airway clearance, did have some response to prednisone burst. Has remained on 10mg  daily since then.  - TTE showed grade 1 diastolic dysfunction and normal RV size and function  - Discussion of enrollment in CLEAR study at [facility] (anti-mycobacterial therapy for treatment of sarcoidosis), but it was closed to enrollment.   - baseline O2 now up to 5-6L after with pulm  - R heart cath 04/29/19: pulmonary hypertension with mean PAP of 33, PAWP 12, borderline cardiac output, and PVR of 4.3 Wood units. Today, reports she continues to feel tired all the time with low energy. Gets short of breath walking short distances. Would really like to be able to go out to do shopping and to climb stairs in her home without feeling SOB. She started sildenafil a few days ago and has noticed some small improvements, so is hoping this will work for her long term.    Brother recently died of COVID in his home - she was the one who had been checking in on him while he was sick, and she is the one who found him dead. She has a lot of guilt about this, and is also now tasked with taking care of his estate while also taking care of her parents who both have dementia. She is not sleeping much due to the stress and grief.      Past Medical History:   Diagnosis Date   ??? 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 Breast injury     lung surg on left incision under breast sept 2016   ??? Hepatitis C antibody test positive 2016    repeatedly negative HCV RNA, indicating clearance of infection w/o treatment   ??? Hyperlipidemia    ??? Hypertension    ??? 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 On home O2 2008    per Dr Mal Amabile note from 02/21/2017   ??? Pulmonary aspergillosis (CMS-HCC) 2016    A.fumigatus in 2016, prior to LUL lobectomy. A.niger from sputum 08/2016-09/2016.   ??? 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 Sarcoidosis 1995   ??? Type 2 diabetes mellitus with hyperglycemia (CMS-HCC) 07/27/2014       Past Surgical History:   Procedure Laterality Date   ??? LUNG LOBECTOMY     ??? PR RIGHT HEART CATH O2 SATURATION & CARDIAC OUTPUT N/A 04/29/2019    Procedure: Right Heart Catheterization;  Surgeon: Rosana Hoes, MD;  Location: [facility] CATH;  Service: Cardiology   ??? PR THORACOSCOPY SURG TOT PULM DECORT Left 12/14/2014    Procedure: THORACOSCOPY SURG; W/TOT PULM DECORTIC/PNEUMOLYS;  Surgeon: Evert Kohl, MD;  Location: MA[facility];  Service: Cardiothoracic   ??? PR THORACOSCOPY W/THERA WEDGE RESEXN INITIAL UNILAT Left 12/03/2014    Procedure: THORACOSCOPY, SURGICAL; WITH THERAPEUTIC WEDGE RESECTION (EG, MASS, NODULE) INITIAL UNILATERAL;  Surgeon: Evert Kohl, MD;  Location: MAIN OR [facility];  Service: Cardiothoracic       No Known Allergies    Current Outpatient Medications   Medication Sig Dispense Refill   ??? ACCU-CHEK FASTCLIX LANCET DRUM Misc USE TO CHECK GLUCOSE 4 TIMES DAILY BEFORE MEAL(S) AND NIGHTLY     ??? ACCU-CHEK GUIDE GLUCOSE METER Misc 1 Device by Other route Four (4) times a day (before meals and nightly). AS DIRECTED 1 kit 0   ??? albuterol 2.5 mg /3 mL (0.083 %) nebulizer solution Inhale 3 mL (2.5 mg total) by nebulization every six (6) hours as needed for wheezing or shortness of breath (airway clearance/cough). 120 mL 0   ??? albuterol HFA 90 mcg/actuation inhaler Inhale 2 puffs every four (4) hours as needed for wheezing or shortness of breath. 8.5 g 0   ??? budesonide-formoteroL (SYMBICORT) 160-4.5 mcg/actuation inhaler Inhale 2 puffs Two (2) times a day. 20 g 2   ??? cyclobenzaprine (FLEXERIL) 10 MG tablet Take 1 tablet (10 mg total) by mouth Three (3) times a day as needed. 90 tablet 0   ??? empty container Misc USE AS DIRECTED 1 each 2   ??? gabapentin (NEURONTIN) 300 MG capsule Take 1 capsule (300 mg total) by mouth Three (3) times a day. 270 capsule 1   ??? insulin ASPART (NOVOLOG U-100 INSULIN ASPART) 100 unit/mL injection Take 1-20 units SQ QID AC and HS as directed 80 mL 1   ??? insulin glargine (BASAGLAR, LANTUS) 100 unit/mL (3 mL) injection pen 18 units 30 pen 3   ??? ipratropium (ATROVENT) 0.02 % nebulizer solution Inhale 2.5 mL (500 mcg total) by nebulization Four (4) times a day. 300 mL 3   ??? ketoconazole (NIZORAL) 2 % cream Apply 1 application topically Two (2) times a day. Use for 2-3 weeks. 30 g 11   ??? MEDICAL SUPPLY ITEM Accucheck Glide Glocumeter for home glucose checks. Check blood glucose ACHS. 1 Package 0   ??? mepolizumab 100 mg/mL AtIn Inject 100 mg under the skin every twenty-eight (28) days. 1 mL 12   ??? metFORMIN (GLUCOPHAGE) 1000 MG tablet Take 1 tablet (1,000 mg total) by mouth 2 (two) times a day with meals. 180 tablet 1   ??? miscellaneous medical supply Misc Nebulizer machine and tubing. Patients machine is 56 years old. 1 each 0   ??? OXYGEN-AIR DELIVERY SYSTEMS MISC Dose: 2-3 LPM     ???  posaconazole (NOXAFIL) 100 mg TbEC delayed released tablet Take 200 mg by mouth daily. 60 tablet 2   ??? pravastatin (PRAVACHOL) 40 MG tablet Take 1 tablet (40 mg total) by mouth daily with evening meal. 90 tablet 1   ??? predniSONE (DELTASONE) 5 MG tablet Take 2 tablets (10 mg total) by mouth daily. 60 tablet 1   ??? sildenafiL, pulm.hypertension, (REVATIO) 20 mg tablet Take 1 tablet (20 mg total) by mouth Three (3) times a day. 90 tablet 11   ??? sodium chloride 7% 7 % Nebu Inhale 1 vial (4 mL) by nebulization Two (2) times a day. 720 mL 3   ??? blood sugar diagnostic Strp by Other route Four (4) times a day (before meals and nightly). 400 strip 1   ??? EPINEPHrine (EPIPEN 2-PAK) 0.3 mg/0.3 mL injection Inject 0.3 mL (0.3 mg total) into the muscle once for 1 dose. 1 Device 1   ??? hydroCHLOROthiazide (HYDRODIURIL) 25 MG tablet Take 1 tablet (25 mg total) by mouth daily. TAKE ONE (1) TABLET BY MOUTH EVERY DAY 90 tablet 1     No current facility-administered medications for this visit.        Social history:  Reviewed and unchanged except as noted in the HPI.    Family History   Problem Relation Age of Onset   ??? Diabetes Father    ??? Diabetes Brother    ??? Diabetes Maternal Aunt    ??? Sarcoidosis Maternal Aunt    ??? Diabetes Maternal Uncle    ??? Diabetes Paternal Aunt    ??? Diabetes Paternal Uncle        ROS: 12 systems reviewed and negative except as per HPI.     PE:  BP 119/89  - Pulse 98  - Temp 36.4 ??C (97.6 ??F)  - Ht 182.9 cm (6')  - Wt (!) 107.6 kg (237 lb 4.8 oz)  - LMP  (LMP Unknown)  - BMI 32.18 kg/m??   GEN: alert, NAD, speaking in full sentences  Eyes: sclera anicteric  ENT: nasal cannula in place  CV: RRR, no murmur  RESP: normal WOB at rest. Decreased breath sounds over L mid lung field. CTAB on the right without wheezes or crackles  PSYCH: somewhat depressed mood, but appropriate affect, good insight, interactive.    Labs:    Lab Results   Component Value Date    ALKPHOS 73 08/28/2018    BILITOT 0.4 08/28/2018    BILIDIR <0.10 12/14/2017    PROT 7.8 08/28/2018    ALBUMIN 4.0 08/28/2018    ALT 11 08/28/2018    AST 15 08/28/2018     Lab Results   Component Value Date    WBC 7.1 04/29/2019    HGB 11.5 (L) 04/29/2019    HCT 39.4 04/29/2019    MCV 86.6 04/29/2019    RDW 14.2 04/29/2019    PLT 243 04/29/2019    NEUTROPCT 55.0 05/22/2018    LYMPHOPCT 22.2 05/22/2018    MONOPCT 12.2 05/22/2018    EOSPCT 6.0 05/22/2018    BASOPCT 0.7 05/22/2018         Drug monitoring:  Posa level 05/22/18: 2,552 --> dose decreased to 200mg  daily  Posa level 08/28/18: 1,636 --> continue 200mg  daily    Lab Results   Component Value Date    ALKPHOS 73 08/28/2018    BILITOT 0.4 08/28/2018    BILIDIR <0.10 12/14/2017    PROT 7.8 08/28/2018    ALBUMIN 4.0 08/28/2018  ALT 11 08/28/2018    AST 15 08/28/2018     Posaconazole levels  - 05/22/18: 2,552  - 08/28/18: 1,636    Microbiology:  2020:  04/25/18: sputum AFB cx +mycobacterium fortuitum    2019:  02/14/18: OPF, fungal neg, AFB NGTD; galactomannan <0.5, fungitell neg  9/19, 9/29: AFB cx +mycobacterium fortuitum  Mycobacterium fortuitum group       MIC SUSCEPTIBILITY RESULT     Amikacin 2  Susceptible     Cefoxitin 16  Susceptible     Ciprofloxacin 0.5  Susceptible     Clarithromycin >16  Resistant1     Doxycycline 4  Intermediate     Imipenem 8  Intermediate2     Linezolid 2  Susceptible     Minocycline 2  Intermediate     Moxifloxacin 0.25  Susceptible     Tigecycline 0.03  No Interpretation     Trimethoprim + Sulfamethoxazole 0.25  Susceptible      2018:  02/19/17: OPF, fungal neg, AFB neg  7/12 LRCx: Aspergillus [place], 3+ Achromobacter species, 3+ Enterococcus faecium  7/10 Sputum fungal culture: Aspergillus [place]  7/9 LRCx: Aspergillus [place], OP flora, Achromobacter spp.    AFB smear x3 negative; cx NGTD      Imaging:   No recent imaging.

## 2019-06-01 NOTE — Unmapped (Deleted)
ID Clinic Note - Follow-up Visit    ASSESSMENT AND PLAN:  54yoF w/ sarcoidosis and h/o chronic cavitary pulmonary aspergillosis in 2010 s/p LUL lobectomy w/ VATS on 12/03/14 who had frequent hospitalizations in 2018 for respiratory issues/infections, including in 09/2016 when she was found to have a L apical thick-walled cavitary lesion w/ adjacent lung parenchymal abnormalities and sputum culture positive for Aspergillus Luxembourg and Achromobacter spp. Initially treated with voriconazole for chronic pulmonary aspergillosis. This was transitioned to posaconazole to reduce potential toxicities after discussing risks and benefits of stopping therapy completely as it was unlikely to be curative and she is not a transplant candidate due to anatomical concerns.    Continues to have chronic DOE, cough, and increased baseline O2 requirement. As she did not have a durable response to Nucala, asthma thought to be a less likely contributor. Also found to have pulm HTN on recent RHC - just started sildenafil. PET CT c/w metabolocally active sarcoid, so increased immunosuppression may be something that is needed in the future. Given known chronic aspergillosis, she would certainly be at risk for worsening infection, but is doing well on posaconazole tx. It may be worth the risk to decrease frequency of flares of respiratory sxs - will discuss with Ms. Oestreicher and Dr. Wellington Hampshire should the need for further immunosuppression arise.     Sildenafil dose decreased given interaction with posaconazole. She is due for posaconazole level - will get this at PCP on Monday. Will check LFTs today for monitoring.    Today:  - CONTINUE posaconazole 200mg  daily. Discuss at each visit risks and benefits of trial off of this medication, but elected to continue today as she is starting sildenafil so would like to keep all other variables the same.   - Will check LFTs today  - She will go to PCP office Monday AM to have posaconazole level drawn - ordered today.  - Recommended COVID-19 vaccine - she will get it when she becomes eligible later this month.      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 Kearney Hard --> Dr. Wellington Hampshire  - Not a lung transplant candidate 2/2 anatomy after past surgery  - Started sildenafil for [facility] 05/2019  ??  Chronic cavitary pulmonary aspergillosis, first diagnosed 2010  - previously treated with multiple courses of voriconazole  - 2016 cultures with Aspergillus fumigatus  - s/p LUL lobectomy/VATS 12/03/14 after episodes of hemoptysis  - s/p voriconazole 11/20/14-04/2015   - New L apical thick walled cavitary lesion and adjacent lung parenchymal abnormalities 09/08/16 in setting of 1 month of worsening pulmonary??infectious??symptoms, leukocytosis (on steroids)  - 6/22 and 6/23 sputum cultures: Aspergillus Luxembourg, OPF.  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  - 02/13/2018 CT: decreased size of L apical cavity soft tissue density  - Posaconazole 03/2018- (dose decreased based on levels to 200mg  daily)  ??  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  - S/p 7 week trial of TMP/sulfa + moxifloxacin from 04/2018 - 06/2018 without improvement  ??  Pertinent past Infections  - H/o Achromobacter pneumonia 11/18/14, treated w meropenem x 14 d; 09/25/16, treated with 4 weeks meropenem  - H/o Stenotrophomonas (S: minocycline, I: levoflox R: bactrim, ceftaz)??PNA 12/03/14 treated with minocycline x 14d  - Hepatitis C Ab-positive, VL neg  - Nutritionally variant Strep bacteremia, 06/2014  ??  ??  Education and counseling took 15 minutes of today's visit.  ??  Duanne Limerick, MD, MHS  Fellow, [facility] Division of Infectious Diseases  ??  [facility]   1st floor [facility]  [address]  [place], [place]. [address]   Phone: [phone]   Fax: [phone]   ??  _____________________________________________________________________  ??  ??  CC: F/u of mycetoma, chronic cavitary pulmonary aspergillosis  ??  HPI: ??54yoF w/ h/o sarcoidosis, severe eosinophilic asthma, bronchiectasis, chronic cavitary pulmonary aspergillosis s/p resection in 2016 currently on posaconazole for suppressive therapy, and positive M.fortuitum sputum cultures. Initially had response to Nucala, but only temporarily, so asthma not thought to be driving cause of her chronic symptoms. Based on CT and PET scan findings in 2020, active pulm sarcoid thought to be primary driver of symptoms.    Since last visit in Oct 2020:  - In Oct-Nov had worsening of cough and SOB. Didn't respond to Augmentin + aggressive airway clearance, did have some response to prednisone burst. Has remained on 10mg  daily since then.  - TTE showed grade 1 diastolic dysfunction and normal RV size and function  - Discussion of enrollment in CLEAR study at [facility] (anti-mycobacterial therapy for treatment of sarcoidosis), but it was closed to enrollment.   - baseline O2 now up to 5-6L after with pulm  - R heart cath 04/29/19: pulmonary hypertension with mean PAP of 33, PAWP 12, borderline cardiac output, and PVR of 4.3 Wood units.    Today, reports she continues to feel tired all the time with low energy. Gets short of breath walking short distances. Would really like to be able to go out to do shopping and to climb stairs in her home without feeling SOB. She started sildenafil a few days ago and has noticed some small improvements, so is hoping this will work for her long term.    Brother recently died of COVID in his home - she was the one who had been checking in on him while he was sick, and she is the one who found him dead. She has a lot of guilt about this, and is also now tasked with taking care of his estate while also taking care of her parents who both have dementia. She is not sleeping much due to the stress and grief.      Past Medical History:   Diagnosis Date   ??? 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 Breast injury     lung surg on left incision under breast sept 2016   ??? Hepatitis C antibody test positive 2016    repeatedly negative HCV RNA, indicating clearance of infection w/o treatment   ??? Hyperlipidemia    ??? Hypertension    ??? 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  On home O2 2008    per Dr Mal Amabile note from 02/21/2017   ??? Pulmonary aspergillosis (CMS-HCC) 2016    A.fumigatus in 2016, prior to LUL lobectomy. A.niger from sputum 08/2016-09/2016.   ??? 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 Sarcoidosis 1995   ??? Type 2 diabetes mellitus with hyperglycemia (CMS-HCC) 07/27/2014       Past Surgical History:   Procedure Laterality Date   ??? LUNG LOBECTOMY     ??? PR RIGHT HEART CATH O2 SATURATION & CARDIAC OUTPUT N/A 04/29/2019    Procedure: Right Heart Catheterization;  Surgeon: Rosana Hoes, MD;  Location: [facility] CATH;  Service: Cardiology   ??? PR THORACOSCOPY SURG TOT PULM DECORT Left 12/14/2014    Procedure: THORACOSCOPY SURG; W/TOT PULM DECORTIC/PNEUMOLYS;  Surgeon: Evert Kohl, MD;  Location: MAIN OR [facility];  Service: Cardiothoracic   ??? PR THORACOSCOPY W/THERA WEDGE RESEXN INITIAL UNILAT Left 12/03/2014    Procedure: THORACOSCOPY, SURGICAL; WITH THERAPEUTIC WEDGE RESECTION (EG, MASS, NODULE) INITIAL UNILATERAL;  Surgeon: Evert Kohl, MD;  Location: MAIN OR [facility];  Service: Cardiothoracic       No Known Allergies    Current Outpatient Medications   Medication Sig Dispense Refill   ??? ACCU-CHEK FASTCLIX LANCET DRUM Misc USE TO CHECK GLUCOSE 4 TIMES DAILY BEFORE MEAL(S) AND NIGHTLY     ??? ACCU-CHEK GUIDE GLUCOSE METER Misc 1 Device by Other route Four (4) times a day (before meals and nightly). AS DIRECTED 1 kit 0   ??? albuterol 2.5 mg /3 mL (0.083 %) nebulizer solution Inhale 3 mL (2.5 mg total) by nebulization every six (6) hours as needed for wheezing or shortness of breath (airway clearance/cough). 120 mL 0   ??? albuterol HFA 90 mcg/actuation inhaler Inhale 2 puffs every four (4) hours as needed for wheezing or shortness of breath. 8.5 g 0   ??? budesonide-formoteroL (SYMBICORT) 160-4.5 mcg/actuation inhaler Inhale 2 puffs Two (2) times a day. 20 g 2   ??? cyclobenzaprine (FLEXERIL) 10 MG tablet Take 1 tablet (10 mg total) by mouth Three (3) times a day as needed. 90 tablet 0   ??? empty container Misc USE AS DIRECTED 1 each 2   ??? gabapentin (NEURONTIN) 300 MG capsule Take 1 capsule (300 mg total) by mouth Three (3) times a day. 270 capsule 1   ??? insulin ASPART (NOVOLOG U-100 INSULIN ASPART) 100 unit/mL injection Take 1-20 units SQ QID AC and HS as directed 80 mL 1   ??? insulin glargine (BASAGLAR, LANTUS) 100 unit/mL (3 mL) injection pen 18 units 30 pen 3   ??? ipratropium (ATROVENT) 0.02 % nebulizer solution Inhale 2.5 mL (500 mcg total) by nebulization Four (4) times a day. 300 mL 3   ??? ketoconazole (NIZORAL) 2 % cream Apply 1 application topically Two (2) times a day. Use for 2-3 weeks. 30 g 11   ??? MEDICAL SUPPLY ITEM Accucheck Glide Glocumeter for home glucose checks. Check blood glucose ACHS. 1 Package 0   ??? mepolizumab 100 mg/mL AtIn Inject 100 mg under the skin every twenty-eight (28) days. 1 mL 12   ??? metFORMIN (GLUCOPHAGE) 1000 MG tablet Take 1 tablet (1,000 mg total) by mouth 2 (two) times a day with meals. 180 tablet 1   ??? miscellaneous medical supply Misc Nebulizer machine and tubing. Patients machine is 56 years old. 1 each 0   ??? OXYGEN-AIR DELIVERY SYSTEMS MISC Dose: 2-3 LPM     ??? posaconazole (NOXAFIL) 100 mg TbEC  delayed released tablet Take 200 mg by mouth daily. 60 tablet 2   ??? pravastatin (PRAVACHOL) 40 MG tablet Take 1 tablet (40 mg total) by mouth daily with evening meal. 90 tablet 1   ??? predniSONE (DELTASONE) 5 MG tablet Take 2 tablets (10 mg total) by mouth daily. 60 tablet 1   ??? sildenafiL, pulm.hypertension, (REVATIO) 20 mg tablet Take 1 tablet (20 mg total) by mouth Three (3) times a day. 90 tablet 11   ??? sodium chloride 7% 7 % Nebu Inhale 1 vial (4 mL) by nebulization Two (2) times a day. 720 mL 3   ??? blood sugar diagnostic Strp by Other route Four (4) times a day (before meals and nightly). 400 strip 1   ??? EPINEPHrine (EPIPEN 2-PAK) 0.3 mg/0.3 mL injection Inject 0.3 mL (0.3 mg total) into the muscle once for 1 dose. 1 Device 1   ??? hydroCHLOROthiazide (HYDRODIURIL) 25 MG tablet Take 1 tablet (25 mg total) by mouth daily. TAKE ONE (1) TABLET BY MOUTH EVERY DAY 90 tablet 1     No current facility-administered medications for this visit.        Social history:  Reviewed and unchanged except as noted in the HPI.    Family History   Problem Relation Age of Onset   ??? Diabetes Father    ??? Diabetes Brother    ??? Diabetes Maternal Aunt    ??? Sarcoidosis Maternal Aunt    ??? Diabetes Maternal Uncle    ??? Diabetes Paternal Aunt    ??? Diabetes Paternal Uncle        ROS: 12 systems reviewed and negative except as per HPI.     PE:  No vitals done because this was video visit.  GEN: alert, NAD, speaking in full sentences  Eyes: sclera anicteric  ENT: nasal cannula in place  RESP: normal WOB  PSYCH: normal affect and mood, pleasant and interactive.    Labs: Lab Results   Component Value Date    ALKPHOS 73 08/28/2018    BILITOT 0.4 08/28/2018    BILIDIR <0.10 12/14/2017    PROT 7.8 08/28/2018    ALBUMIN 4.0 08/28/2018    ALT 11 08/28/2018    AST 15 08/28/2018     Lab Results   Component Value Date    WBC 7.1 04/29/2019    HGB 11.5 (L) 04/29/2019    HCT 39.4 04/29/2019    MCV 86.6 04/29/2019    RDW 14.2 04/29/2019    PLT 243 04/29/2019    NEUTROPCT 55.0 05/22/2018    LYMPHOPCT 22.2 05/22/2018    MONOPCT 12.2 05/22/2018    EOSPCT 6.0 05/22/2018    BASOPCT 0.7 05/22/2018         Drug monitoring:  Posa level 05/22/18: 2,552 --> dose decreased to 200mg  daily  Posa level 08/28/18: 1,636 --> continue 200mg  daily    Lab Results   Component Value Date    ALKPHOS 73 08/28/2018    BILITOT 0.4 08/28/2018    BILIDIR <0.10 12/14/2017    PROT 7.8 08/28/2018    ALBUMIN 4.0 08/28/2018    ALT 11 08/28/2018    AST 15 08/28/2018       Microbiology:  2020:  04/25/18: sputum AFB cx +mycobacterium fortuitum    2019:  02/14/18: OPF, fungal neg, AFB NGTD; galactomannan <0.5, fungitell neg  9/19, 9/29: AFB cx +mycobacterium fortuitum  Mycobacterium fortuitum group       MIC SUSCEPTIBILITY RESULT     Amikacin 2  Susceptible     Cefoxitin 16  Susceptible     Ciprofloxacin 0.5  Susceptible     Clarithromycin >16  Resistant1     Doxycycline 4  Intermediate     Imipenem 8  Intermediate2     Linezolid 2  Susceptible     Minocycline 2  Intermediate     Moxifloxacin 0.25  Susceptible     Tigecycline 0.03  No Interpretation     Trimethoprim + Sulfamethoxazole 0.25  Susceptible      2018:  02/19/17: OPF, fungal neg, AFB neg  7/12 LRCx: Aspergillus Luxembourg, 3+ Achromobacter species, 3+ Enterococcus faecium  7/10 Sputum fungal culture: Aspergillus Luxem[place]  7/9 LRCx: Aspergillus Luxem[place], OP flora, Achromobacter spp.  -  AFB smear x3 negative; cx NGTD      Imaging:   No recent imaging.

## 2019-06-01 NOTE — Unmapped (Signed)
It was good to see you today. I'm sorry to hear about the loss of your brother.     I will call you with the result of the liver tests we did today. Please have a posaconazole level drawn at your PCP's office prior to your morning dose on Monday.

## 2019-06-02 LAB — VITAMIN D, TOTAL (25OH): Lab: 12.9 — ABNORMAL LOW

## 2019-06-02 LAB — POSACONAZOLE LEVEL: Lab: 1874

## 2019-06-02 NOTE — Unmapped (Signed)
[facility] Clinical Assessment & Refill Coordination Note    Karen Barber, DOB: Aug 07, 1965  Phone: [phone] (home) [phone] (work)    All above HIPAA information was verified with patient.     Was a Nurse, learning disability used for this call? No    Specialty Medication(s):   CF/Pulmonary: -Nucala 100mg /ml     Current Outpatient Medications   Medication Sig Dispense Refill   ??? ACCU-CHEK FASTCLIX LANCET DRUM Misc USE TO CHECK GLUCOSE 4 TIMES DAILY BEFORE MEAL(S) AND NIGHTLY     ??? ACCU-CHEK GUIDE GLUCOSE METER Misc 1 Device by Other route Four (4) times a day (before meals and nightly). AS DIRECTED 1 kit 0   ??? albuterol 2.5 mg /3 mL (0.083 %) nebulizer solution Inhale 3 mL (2.5 mg total) by nebulization every six (6) hours as needed for wheezing or shortness of breath (airway clearance/cough). 120 mL 0   ??? albuterol HFA 90 mcg/actuation inhaler Inhale 2 puffs every four (4) hours as needed for wheezing or shortness of breath. 8.5 g 0   ??? blood sugar diagnostic Strp by Other route Four (4) times a day (before meals and nightly). 400 strip 1   ??? budesonide-formoteroL (SYMBICORT) 160-4.5 mcg/actuation inhaler Inhale 2 puffs Two (2) times a day. 20 g 2   ??? cyclobenzaprine (FLEXERIL) 10 MG tablet Take 1 tablet (10 mg total) by mouth Three (3) times a day as needed. 90 tablet 0   ??? empty container Misc USE AS DIRECTED 1 each 2   ??? EPINEPHrine (EPIPEN 2-PAK) 0.3 mg/0.3 mL injection Inject 0.3 mL (0.3 mg total) into the muscle once for 1 dose. 1 Device 1   ??? gabapentin (NEURONTIN) 300 MG capsule Take 1 capsule (300 mg total) by mouth Three (3) times a day. 270 capsule 1   ??? hydroCHLOROthiazide (HYDRODIURIL) 25 MG tablet Take 1 tablet (25 mg total) by mouth daily. TAKE ONE (1) TABLET BY MOUTH EVERY DAY 90 tablet 1   ??? insulin ASPART (NOVOLOG U-100 INSULIN ASPART) 100 unit/mL injection Take 1-20 units SQ QID AC and HS as directed 80 mL 1   ??? insulin glargine (BASAGLAR, LANTUS) 100 unit/mL (3 mL) injection pen 18 units 30 pen 3   ??? ipratropium (ATROVENT) 0.02 % nebulizer solution Inhale 2.5 mL (500 mcg total) by nebulization Four (4) times a day. 300 mL 3   ??? ketoconazole (NIZORAL) 2 % cream Apply 1 application topically Two (2) times a day. Use for 2-3 weeks. 30 g 11   ??? MEDICAL SUPPLY ITEM Accucheck Glide Glocumeter for home glucose checks. Check blood glucose ACHS. 1 Package 0   ??? mepolizumab 100 mg/mL AtIn Inject 100 mg under the skin every twenty-eight (28) days. 1 mL 12   ??? metFORMIN (GLUCOPHAGE) 1000 MG tablet Take 1 tablet (1,000 mg total) by mouth 2 (two) times a day with meals. 180 tablet 1   ??? miscellaneous medical supply Misc Nebulizer machine and tubing. Patients machine is 56 years old. 1 each 0   ??? OXYGEN-AIR DELIVERY SYSTEMS MISC Dose: 2-3 LPM     ??? posaconazole (NOXAFIL) 100 mg TbEC delayed released tablet Take 200 mg by mouth daily. 60 tablet 2   ??? pravastatin (PRAVACHOL) 40 MG tablet Take 1 tablet (40 mg total) by mouth daily with evening meal. 90 tablet 1   ??? predniSONE (DELTASONE) 5 MG tablet Take 2 tablets (10 mg total) by mouth daily. 60 tablet 1   ??? sildenafiL, pulm.hypertension, (REVATIO) 20  mg tablet Take 1 tablet (20 mg total) by mouth Three (3) times a day. 90 tablet 11   ??? sodium chloride 7% 7 % Nebu Inhale 1 vial (4 mL) by nebulization Two (2) times a day. 720 mL 3     No current facility-administered medications for this visit.         Changes to medications: [name] reports no changes at this time.    No Known Allergies    Changes to allergies: No    SPECIALTY MEDICATION ADHERENCE     Nucala  100 mg/ml: 0 days of medicine on hand       Medication Adherence    Patient reported X missed doses in the last month: 0  Specialty Medication: Nucala 100mg /ml  Patient is on additional specialty medications: No  Informant: patient          Specialty medication(s) dose(s) confirmed: Regimen is correct and unchanged.     Are there any concerns with adherence? No    Adherence counseling provided? Not needed    CLINICAL MANAGEMENT AND INTERVENTION      Clinical Benefit Assessment:    Do you feel the medicine is effective or helping your condition? Patient declined to answer    Clinical Benefit counseling provided? Patient will consult with provider at next appt. She is not sure if this is beneficial    Adverse Effects Assessment:    Are you experiencing any side effects? No    Are you experiencing difficulty administering your medicine? No    Quality of Life Assessment:    How many days over the past month did your aslthma  keep you from your normal activities? For example, brushing your teeth or getting up in the morning. 0    Have you discussed this with your provider? Not needed    Therapy Appropriateness:    Is therapy appropriate? Yes, therapy is appropriate and should be continued    DISEASE/MEDICATION-SPECIFIC INFORMATION      For patients on injectable medications: Patient currently has 0 doses left.  Next injection is scheduled for 3/28.    PATIENT SPECIFIC NEEDS     ? Does the patient have any physical, cognitive, or cultural barriers? No, on oxygen    ? Is the patient high risk? No     ? Does the patient require a Care Management Plan? No     ? Does the patient require physician intervention or other additional services (i.e. nutrition, smoking cessation, social work)? No      SHIPPING     Specialty Medication(s) to be Shipped:   CF/Pulmonary: -Nucala 100mg /ml    Other medication(s) to be shipped: n/a     Changes to insurance: No    Delivery Scheduled: Yes, Expected medication delivery date: 3/24.     Medication will be delivered via Same Day Courier to the confirmed prescription address in [facility].    The patient will receive a drug information handout for each medication shipped and additional FDA Medication Guides as required.  Verified that patient has previously received a Conservation officer, historic buildings.    All of the patient's questions and concerns have been addressed.    Julianne Rice   [facility] Specialty Pharmacist

## 2019-06-04 NOTE — Unmapped (Signed)
Attempted to contact pt for scheduled telephone AWV. Family member answered the phone and stated that patient was sleeping. Did not receive call back for AWV within 45 minutes of scheduled appt time. Will call patient later in the day to reschedule.     Elesa Garman Toya Smothers, RN

## 2019-06-11 MED FILL — NUCALA 100 MG/ML SUBCUTANEOUS AUTO-INJECTOR: SUBCUTANEOUS | 28 days supply | Qty: 1 | Fill #11

## 2019-06-11 MED FILL — NUCALA 100 MG/ML SUBCUTANEOUS AUTO-INJECTOR: 28 days supply | Qty: 1 | Fill #11 | Status: AC

## 2019-06-13 NOTE — Unmapped (Signed)
Requested notes efaxed to [facility] regarding the Life [facility].

## 2019-06-26 NOTE — Unmapped (Signed)
Orders faxed to [facility] for POC.

## 2019-07-03 DIAGNOSIS — J4550 Severe persistent asthma, uncomplicated: Principal | ICD-10-CM

## 2019-07-03 DIAGNOSIS — D7219 Eosinophilia: Principal | ICD-10-CM

## 2019-07-03 DIAGNOSIS — Z7952 Long term (current) use of systemic steroids: Principal | ICD-10-CM

## 2019-07-03 MED ORDER — NUCALA 100 MG/ML SUBCUTANEOUS AUTO-INJECTOR
12 refills | 0 days | Status: CP
Start: 2019-07-03 — End: ?
  Filled 2019-07-14: qty 1, 28d supply, fill #0

## 2019-07-03 NOTE — Unmapped (Signed)
[facility] Refill Coordination Note    Specialty Medication(s) to be Shipped:   CF/Pulmonary: -Nucala 100mg /ml     Karen Barber, DOB: 1965/04/30  Phone: [phone] (home) [phone] (work)    All above HIPAA information was verified with patient.     Was a Nurse, learning disability used for this call? No    Completed refill call assessment today to schedule patient's medication shipment from the [facility] [phone]).       Specialty medication(s) and dose(s) confirmed: Regimen is correct and unchanged.   Changes to medications: [name] reports no changes at this time.  Changes to insurance: No  Questions for the pharmacist: No    Confirmed patient received Welcome Packet with first shipment. The patient will receive a drug information handout for each medication shipped and additional FDA Medication Guides as required.       DISEASE/MEDICATION-SPECIFIC INFORMATION        For CF patients: CF [facility] Active? No-not enrolled. Next injection is schedule for 28th of April. 2021    SPECIALTY MEDICATION ADHERENCE     Medication Adherence    Patient reported X missed doses in the last month: 0  Specialty Medication: Nucala 100mg /ml  Patient is on additional specialty medications: No  Informant: patient  Reliability of informant: reliable        Nucala 100mg /ml : 0 days of medicine on hand     SHIPPING     Shipping address confirmed in Epic.     Delivery Scheduled: Yes, Expected medication delivery date: 07/14/2019.     Medication will be delivered via Same Day Courier to the prescription address in Epic [facility].    Karen Barber   [facility] Specialty Technician

## 2019-07-03 NOTE — Unmapped (Signed)
Pharmacy Refill Request  Nucala Injection    Last OV:   05/13/2019    Last written Rx:   N/A    Upcoming appointment:   N/A

## 2019-07-10 ENCOUNTER — Other Ambulatory Visit: Admit: 2019-07-10 | Discharge: 2019-07-11 | Payer: MEDICARE

## 2019-07-10 NOTE — Unmapped (Signed)
Lab visit

## 2019-07-11 LAB — POSACONAZOLE LEVEL: Lab: 2058

## 2019-07-14 MED FILL — NUCALA 100 MG/ML SUBCUTANEOUS AUTO-INJECTOR: 28 days supply | Qty: 1 | Fill #0 | Status: AC

## 2019-07-29 NOTE — Unmapped (Signed)
Abstraction Result Flowsheet Data    This patient's last AWV date: [facility] Last Medicare Wellness Visit Date: 05/11/2017  This patients last WCC/CPE date: : Not Found      Reason for Encounter  Reason for Encounter: Outreach  Primary Reason for Call: AWV  Outreach Call Outcome: Scheduled (Phone 5/20 @9 :00am)  Text Message: No

## 2019-08-01 NOTE — Unmapped (Signed)
I spent 30 minutes on the phone with the patient on the date of service. I spent an additional 15 minutes on pre- and post-visit activities on the date of service.     The patient was physically located in [place] or a state in which I am permitted to provide care. The patient and/or parent/guardian understood that s/he may incur co-pays and cost sharing, and agreed to the telemedicine visit. The visit was reasonable and appropriate under the circumstances given the patient's presentation at the time.    The patient and/or parent/guardian has been advised of the potential risks and limitations of this mode of treatment (including, but not limited to, the absence of in-person examination) and has agreed to be treated using telemedicine. The patient's/patient's family's questions regarding telemedicine have been answered.     If the visit was completed in an ambulatory setting, the patient and/or parent/guardian has also been advised to contact their provider???s office for worsening conditions, and seek emergency medical treatment and/or call 911 if the patient deems either necessary.    This patient is currently receiving Embedded Case Management through [facility] in their PCP office.     Primary Case Manager: Beatriz Stallion, RN   Primary Case Manager Phone #: [phone]    Please contact CM for care plan changes, updates or recent discharges.    High Risk Drivers: Multiple Complex Diagnoses  Primary Disease Process: COPD and DM  Current Residence: Home alone  Primary Medical Home: Noralyn Pick, FNP???s office  @ [facility] [place]   Current services: none  Patient's Primary Concern is/goals are: Maintain Health and Improve chronic condition management  Barriers: Caregiver Stress  Strengths: Self-advocacy, Family connection, Spiritual/faith connection, Positive relationship with PCP and Positive relationship with specialists  Supports: Spouse/Partner and Siblings  Interventions provided: Contact Information Provided and Medication Reconciliation  Follow up: with PCP      This patient has been reviewed and is eligible for Intensive Case Management services but has not been able to be contacted. Please obtain best contact information.    To have this patient reevaluated for Intensive Case Management please send an AMB Referral to Case Management to the Personal Health Advocate Department.          This auto-generated note displays abnormal results identified during the AWV Assessments. For full results, please see the Flowsheet Links under the Additional Documentation section of this encounter in Chart Review.      Risks Identified:  The following risks were identified and addressed this visit. Refer to progress note below for specifics on risks identified and interventions provided.   BMI Abnormal and/or patient would benefit from meeting with an RD for Chronic Condition Management and caregiver stress  -BMI 30: Reports some light exercise as able. Requesting RD referral today - referral placed.   -Caregiver stress: Shares responsibility of caring for her elderly parents (mother with dementia) with her brother. Oldest brother who was the primary caregiver just passed away from COVID.        Social Determinants of Health:  Social Determinants of Health Screened today. Interventions Provided: N/A; No issues identified.    PCP notified of above risks by  N/a    The following list of current providers and suppliers reviewed and updated this visit.  Patient Care Team:  Loran Senters, FNP as PCP - General (Family Medicine)  Keri Rosita Fire, FNP as PCP - Rande Brunt  Ria Comment, MD as Fellow (Pulmonary Disease)  Melanee Left, MD as Resident (Infectious Diseases)  Rada Zegers Toya Smothers, RN as Case Manager    Medications and supplements were reviewed and updated this visit. See medication list in encounter summary.     Recent Hospitalizations reviewed:  No recent hospitalizations     General Health:  Patient answered (!) Fair (realted to breathing) to self health assessment  discussed setting goals related to self-health and wellness information provided in AVS    Patients Body mass index is 30.82 kg/m??. Patient answered (!) Yes to nutrition services referral: Provided information in AVS on: nutrition, exercise      Pain identified during today's visit        08/07/19 0911   PainSc: 0-No pain   Patient was unable to report the following vital signs today due to visit being performed by telehealth and patient lack of required equipment to obtain: Blood pressure, Pulse and Temperature.  Please see flowsheets for any vital signs that were reported this visit.        Safety:  Patient answered (!) 0 Days to moderate or strenuous exercise and 3 Days to light exercise per week.  Exercise information provided in the AVS    Falls Risk Assessment    Have you fallen in the past year? No (0)    Are you worried about falling?  No (0)    Do you feel unsteady when you am walking? No (0)    Do you use or have you been advised to use a cane or walker to get around safely? No (0)    Do you steady yourself by holding onto furniture when walking at home? No (0)    Do you need to push with your hands to stand up from a chair? No (0)    Do you have some trouble stepping up onto a curb? No (0)    Do you often have to rush to the toilet? No (0)    Have you lost some feeling in your feet? Yes (1)  - tingling in ankles/feet sometimes     Do you take medications that sometimes make you feel light-headed or more tired than usual?  No (0)    Do you take medicine to help you sleep or improve your mood? No (0)    Do you often feel sad or depressed?  No (0)    Total score 1  (add 1 point to each yes answer, 4 or more points indicates risk for falls)      Psychosocial Assessment:  Patient answered one or more psychosocial assessments abnormally  Do you feel stress - tense, restless, nervous, or anxious, or unable to sleep at night because your mind is troubled all the time? Occasionally   Do you feel that you have family and friends that can support you?      Do you express feelings of anger and frustration in ways that are hurtful to yourself or others? Never or Almost Never    Do you feel generally tired or fatigue? (!) Very Often - related to physical condition      Informant's name: Wonda Horner    Informant's relationship to patient: Patient    These six questions ask how the patient is now compared to a few years ago.      Does the patient have more trouble remember things that have happened recently than s/he used to? No    Does he or she have more trouble recalling conversations a few days  later? No    When speaking, does the patient have more difficulty in finding the right or tend to use the wrong words more often? No    Is the patient less able to manage money and financial affairs (e.g. paying bills, budgeting)? No    Is the patient less able to manage his or her medication independently? No    Does the patient need more assistance with transport (either private or public)? No    Total Score: 6    (To calculate score give one point for each No, Don't know, N/A answer given, 0 points for each Yes answer given.  Score of 0-3 is abnormal).           Barriers to Care From History:  Caregiver burden Yes   Cognitive Impairment No   Falls Risk No   Financial difficulty No   Frail Elderly No   Hearing impairment/loss No   Homeless No   Impaired mobility No   Inadequate social/family support No   Ineffective family coping No   Low Literacy No   Nonadherence to medication No   Non-english speaking No   Terminal Illness/Hospice No   Transportation barriers No   Visual impairment Yes     Here is your personalized prevention plan based on your Annual Wellness Visit today.    Medicare Screening & Prevention Guidelines Recommendations Last Date Completed HM Status and Next Due Follow-Up   Colorectal Cancer Screening Patients 50 to 75: stool cards annually OR colonoscopy every 10 years (or more frequently if high risk) OR FIT-DNA every 3 years.  Colonoscopy date: Not Found  FOBT/FIT date: Not Found  Sigmoidoscopy date: Not Found  FIT-DNA date: Not Found Health Maintenance Summary       Status Date      FIT-DNA Stool Test Overdue 06/15/2013        DUE - pt has it at home to complete    DEXA Bone Density Measurement Patients age 81-85 to have a DEXA every 5 years in postmenopausal women, males will defer to PCP. DEXA date: Not Found Health Maintenance Summary    Patient has no health maintenance due at this time      Not within age range   Diabetes Eye Exam Annually if Diabetic. Eye Exam date: 11/15/2018 Health Maintenance Summary       Status Date      Retinal Eye Exam Next Due 11/15/2019      Done 11/15/2018 HM DIABETES EYE EXAM HM Diabetic Eye Exam           Patient has more history with this topic...     Up to date - next due 11/15/2019   Diabetes Foot Exam Annually if Diabetic. Most Recent Foot Exam Date: 11/01/2018 Health Maintenance Summary       Status Date      Foot Exam Next Due 11/01/2019      Done 11/01/2018 HM DIABETES FOOT EXAM     Patient has more history with this topic...     Up to date - next due 11/01/2019   Diabetes Urine Albumin/Creatinine Ratio Annually if Diabetic UACR Date: 11/01/2018   Health Maintenance Summary       Status Date      Urine Albumin/Creatinine Ratio Next Due 11/01/2019      Done 11/01/2018 ALBUMIN / CREATININE URINE RATIO Albumin/Creatinine Ratio             Patient has more history with this topic.Marland KitchenMarland Kitchen  Up to date   Diabetes Hemoglobin A1c Every 3 or 6 months depending on last result Last Hemoglobin A1c Date: 05/29/2019 Health Maintenance Summary       Status Date      Hemoglobin A1c Next Due 08/29/2019      Done 05/29/2019 Registry Metric: Last Hemoglobin A1c Date     Patient has more history with this topic...     Up to date   Heart Disease Screening (fasting lipid panel) Minimum of every 5 years, patients age 67-75,  if no apparent signs or symptoms of heart disease. LDL date: Not Found  Total choleseterol date: Not Found  HDL date: Not Found  Triglycerides date: Not Found Health Maintenance Summary    Patient has no health maintenance due at this time      Due at next visit   Mammogram Screening Age 62-74 every 2 years.  Mammogram date: 02/28/2018 Health Maintenance Summary       Status Date      Mammogram Start Age 1 Next Due 02/29/2020      Done 02/28/2018 MAMMO SCREENING BILATERAL     Patient has more history with this topic...     Up to date - next due 02/29/2020   Pelvic Exam & Pap Smear Women ages 69 to 58 every 3 years with negative cytology (pap smear)  OR,   women ages 23 to 19 every 5 years if they have had both a negative pap and human papillomavirus (HPV) OR,  Every 3 years if they had a positive HPV result Pap Smear date: 02/28/1996  HPV date: Not Found Health Maintenance Summary       Status Date      HPV Cotest with Pap Smear (21-65) Overdue 06/15/1984     Pap Smear with Cotest HPV (21-65) Overdue 02/27/2001      Done 02/28/1996 Registry Metric: Pap Smear date     Patient has more history with this topic...     Due at next visit    Hepatitis C Screening A one-time screening for HCV infection for adults born between [birth year] & 1965. HCV screening date: 01/08/2019 Health Maintenance Summary       Status Date      Hepatitis C Screen This plan is no longer active.      Done 01/08/2019 HEPATITIS C RNA, QUANTITATIVE, PCR     Patient has more history with this topic...       Complete   Tdap Every 10 years (will not be covered by Medicare) DTap/Tdap/TD vaccination: 07/08/2007 Health Maintenance Summary       Status Date      DTaP/Tdap/Td Vaccines Overdue 07/07/2017      Done 07/08/2007 Imm Admin: TdaP     Provided information on how to obtain at pharmacy (tdap)   Influenza Vaccine Annually  Influenza Vaccination: 01/08/2019   Health Maintenance Summary       Status Date      Influenza Vaccine This plan is no longer active.      Done 01/08/2019 Imm Admin: Influenza Vaccine Quad (IIV4 PF) 45mo+   injectable     Patient has more history with this topic...       Up to date - next due around 11/2019   Prevnar and Pneumovax Vaccines Prevnar given at age 30 and Pneumovax given one year later. These vaccines may be given in a different sequence depending on chronic conditions. (utilize BPA for dosing & administration) Pneumonia vaccination: 01/03/2017   Health  Maintenance Summary       Status Date      Pneumococcal Vaccine This plan is no longer active.      Done 01/03/2017 Imm Admin: PNEUMOCOCCAL POLYSACCHARIDE 23     Patient has more history with this topic...       Complete   Zoster Vaccine Healthy adults 50 years and older receive 2 doses of recombinant zoster vaccine two to six months apart (may not be covered by Medicare).  Zoster vaccination: Not Found Health Maintenance Summary       Status Date      Zoster Vaccines Overdue 06/15/2013      Provided information on how to obtain at pharmacy (shingrix)

## 2019-08-02 DIAGNOSIS — D860 Sarcoidosis of lung: Principal | ICD-10-CM

## 2019-08-02 MED ORDER — PREDNISONE 5 MG TABLET
ORAL_TABLET | 0 refills | 0 days
Start: 2019-08-02 — End: ?

## 2019-08-02 MED ORDER — CYCLOBENZAPRINE 10 MG TABLET
ORAL_TABLET | 0 refills | 0 days
Start: 2019-08-02 — End: ?

## 2019-08-06 DIAGNOSIS — D860 Sarcoidosis of lung: Principal | ICD-10-CM

## 2019-08-06 DIAGNOSIS — D869 Sarcoidosis, unspecified: Principal | ICD-10-CM

## 2019-08-06 MED ORDER — ALBUTEROL SULFATE HFA 90 MCG/ACTUATION AEROSOL INHALER
RESPIRATORY_TRACT | 0 refills | 0.00000 days | PRN
Start: 2019-08-06 — End: 2020-08-05

## 2019-08-06 MED ORDER — PREDNISONE 5 MG TABLET
ORAL_TABLET | Freq: Every day | ORAL | 1 refills | 30 days
Start: 2019-08-06 — End: ?

## 2019-08-07 ENCOUNTER — Ambulatory Visit: Admit: 2019-08-07 | Discharge: 2019-08-07 | Payer: MEDICARE | Attending: Family | Primary: Family

## 2019-08-07 ENCOUNTER — Institutional Professional Consult (permissible substitution): Admit: 2019-08-07 | Discharge: 2019-08-07 | Payer: MEDICARE

## 2019-08-07 DIAGNOSIS — J418 Mixed simple and mucopurulent chronic bronchitis: Principal | ICD-10-CM

## 2019-08-07 DIAGNOSIS — Z0000 Encounter for general adult medical examination without abnormal findings: Principal | ICD-10-CM

## 2019-08-07 DIAGNOSIS — Z6830 Body mass index (BMI) 30.0-30.9, adult: Principal | ICD-10-CM

## 2019-08-07 MED ORDER — ALBUTEROL SULFATE HFA 90 MCG/ACTUATION AEROSOL INHALER
RESPIRATORY_TRACT | 3 refills | 0 days | Status: CP | PRN
Start: 2019-08-07 — End: 2020-08-06

## 2019-08-07 MED ORDER — PREDNISONE 5 MG TABLET
ORAL_TABLET | Freq: Every day | ORAL | 1 refills | 30 days
Start: 2019-08-07 — End: ?

## 2019-08-07 MED ORDER — BUDESONIDE-FORMOTEROL HFA 160 MCG-4.5 MCG/ACTUATION AEROSOL INHALER
Freq: Two times a day (BID) | RESPIRATORY_TRACT | 4 refills | 30 days | Status: CP
Start: 2019-08-07 — End: 2019-11-05

## 2019-08-07 NOTE — Unmapped (Unsigned)
Assessment and Plan:     There are no diagnoses linked to this encounter.    HPI:      Karen Barber  is here for No chief complaint on file.    {kerichronicdx:74702}    {keriacutedx:74703}       [facility] Components:     Goals     ??? Get out more often and participate in more activities      Reviewed LS 08/07/19    Things to think about to help me reach my goal:     What are you going to do? Getting out more, exercise   How and how much? Treadmill   How frequent? daily   Barriers to success? breathing   Solutions to barriers?                Medication adherence and barriers to the treatment plan have been addressed. Opportunities to optimize healthy behaviors have been discussed. Patient / caregiver voiced understanding.      Past Medical/Surgical History:     Past Medical History:   Diagnosis Date   ??? 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 Breast injury     lung surg on left incision under breast sept 2016   ??? Caregiver burden     parents    ??? Hepatitis C antibody test positive 2016    repeatedly negative HCV RNA, indicating clearance of infection w/o treatment   ??? Hyperlipidemia    ??? Hypertension    ??? 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 On home O2 2008    per Dr Mal Amabile note from 02/21/2017   ??? Pulmonary aspergillosis (CMS-HCC) 2016    A.fumigatus in 2016, prior to LUL lobectomy. A.niger from sputum 08/2016-09/2016.   ??? 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 Sarcoidosis 1995   ??? Type 2 diabetes mellitus with hyperglycemia (CMS-HCC) 07/27/2014   ??? Visual impairment     glasses     Past Surgical History:   Procedure Laterality Date   ??? LUNG LOBECTOMY     ??? PR RIGHT HEART CATH O2 SATURATION & CARDIAC OUTPUT N/A 04/29/2019    Procedure: Right Heart Catheterization;  Surgeon: Rosana Hoes, MD;  Location: [facility] CATH;  Service: Cardiology   ??? PR THORACOSCOPY SURG TOT PULM DECORT Left 12/14/2014    Procedure: THORACOSCOPY SURG; W/TOT PULM DECORTIC/PNEUMOLYS;  Surgeon: Evert Kohl, MD;  Location: MAIN OR [facility];  Service: Cardiothoracic   ??? PR THORACOSCOPY W/THERA WEDGE RESEXN INITIAL UNILAT Left 12/03/2014    Procedure: THORACOSCOPY, SURGICAL; WITH THERAPEUTIC WEDGE RESECTION (EG, MASS, NODULE) INITIAL UNILATERAL;  Surgeon: Evert Kohl, MD;  Location: MAIN OR [facility];  Service: Cardiothoracic       Family History:     Family History   Problem Relation Age of Onset   ??? Diabetes Father    ??? Diabetes Brother    ??? Diabetes Maternal Aunt    ??? Sarcoidosis Maternal Aunt    ??? Diabetes Maternal Uncle    ??? Diabetes Paternal Aunt    ??? Diabetes Paternal Uncle        Social History:     Social History     Socioeconomic History   ??? Marital status: Single     Spouse name: Not on file   ??? Number of children: Not on file   ??? Years of  education: Not on file   ??? Highest education level: Not on file   Occupational History   ??? Not on file   Tobacco Use   ??? Smoking status: Former Smoker     Packs/day: 0.50     Years: 9.00     Pack years: 4.50     Types: Cigarettes     Quit date: 05/03/1987     Years since quitting: 32.2   ??? Smokeless tobacco: Never Used   Vaping Use   ??? Vaping Use: Never used   Substance and Sexual Activity   ??? Alcohol use: No     Alcohol/week: 0.0 standard drinks   ??? Drug use: No   ??? Sexual activity: Yes   Other Topics Concern   ??? Do you use sunscreen? No   ??? Tanning bed use? No   ??? Are you easily burned? No   ??? Excessive sun exposure? No   ??? Blistering sunburns? No   Social History Narrative   ??? Not on file     Social Determinants of Health     Financial Resource Strain: Low Risk    ??? Difficulty of Paying Living Expenses: Not hard at all   Food Insecurity: No Food Insecurity   ??? Worried About Programme researcher, broadcasting/film/video in the Last Year: Never true   ??? Ran Out of Food in the Last Year: Never true   Transportation Needs: No Transportation Needs   ??? Lack of Transportation (Medical): No   ??? Lack of Transportation (Non-Medical): No   Physical Activity:    ??? Days of Exercise per Week:    ??? Minutes of Exercise per Session:    Stress:    ??? Feeling of Stress :    Social Connections:    ??? Frequency of Communication with Friends and Family:    ??? Frequency of Social Gatherings with Friends and Family:    ??? Attends Religious Services:    ??? Database administrator or Organizations:    ??? Attends Banker Meetings:    ??? Marital Status:        Allergies:     Patient has no known allergies.    Current Medications:     Current Outpatient Medications   Medication Sig Dispense Refill   ??? ACCU-CHEK FASTCLIX LANCET DRUM Misc USE TO CHECK GLUCOSE 4 TIMES DAILY BEFORE MEAL(S) AND NIGHTLY     ??? ACCU-CHEK GUIDE GLUCOSE METER Misc 1 Device by Other route Four (4) times a day (before meals and nightly). AS DIRECTED 1 kit 0   ??? albuterol 2.5 mg /3 mL (0.083 %) nebulizer solution Inhale 3 mL (2.5 mg total) by nebulization every six (6) hours as needed for wheezing or shortness of breath (airway clearance/cough). 120 mL 0   ??? albuterol HFA 90 mcg/actuation inhaler Inhale 2 puffs every four (4) hours as needed for wheezing or shortness of breath. 8.5 g 3   ??? blood sugar diagnostic Strp by Other route Four (4) times a day (before meals and nightly). 400 strip 1   ??? budesonide-formoteroL (SYMBICORT) 160-4.5 mcg/actuation inhaler Inhale 2 puffs Two (2) times a day. 20 g 4   ??? cyclobenzaprine (FLEXERIL) 10 MG tablet Take 1 tablet (10 mg total) by mouth Three (3) times a day as needed. 90 tablet 0   ??? empty container Misc USE AS DIRECTED 1 each 2   ??? EPINEPHrine (EPIPEN 2-PAK) 0.3 mg/0.3 mL injection Inject 0.3 mL (0.3 mg total) into  the muscle once for 1 dose. (Patient not taking: Reported on 08/07/2019) 1 Device 1   ??? gabapentin (NEURONTIN) 300 MG capsule Take 1 capsule (300 mg total) by mouth Three (3) times a day. 270 capsule 1   ??? hydroCHLOROthiazide (HYDRODIURIL) 25 MG tablet Take 1 tablet (25 mg total) by mouth daily. TAKE ONE (1) TABLET BY MOUTH EVERY DAY 90 tablet 1   ??? insulin ASPART (NOVOLOG U-100 INSULIN ASPART) 100 unit/mL injection Take 1-20 units SQ QID AC and HS as directed 80 mL 1   ??? insulin glargine (BASAGLAR, LANTUS) 100 unit/mL (3 mL) injection pen 18 units 30 pen 3   ??? ipratropium (ATROVENT) 0.02 % nebulizer solution Inhale 2.5 mL (500 mcg total) by nebulization Four (4) times a day. 300 mL 3   ??? ketoconazole (NIZORAL) 2 % cream Apply 1 application topically Two (2) times a day. Use for 2-3 weeks. 30 g 11   ??? MEDICAL SUPPLY ITEM Accucheck Glide Glocumeter for home glucose checks. Check blood glucose ACHS. 1 Package 0   ??? mepolizumab (NUCALA) 100 mg/mL AtIn Inject the contents of 1 syringe (100 mg) under the skin every twenty-eight (28) days. 1 mL 12   ??? metFORMIN (GLUCOPHAGE) 1000 MG tablet Take 1 tablet (1,000 mg total) by mouth 2 (two) times a day with meals. 180 tablet 1   ??? miscellaneous medical supply Misc Nebulizer machine and tubing. Patients machine is 56 years old. 1 each 0   ??? OXYGEN-AIR DELIVERY SYSTEMS MISC Dose: 2-3 LPM     ??? posaconazole (NOXAFIL) 100 mg TbEC delayed released tablet Take 200 mg by mouth daily. 60 tablet 2   ??? pravastatin (PRAVACHOL) 40 MG tablet Take 1 tablet (40 mg total) by mouth daily with evening meal. 90 tablet 1   ??? predniSONE (DELTASONE) 5 MG tablet Take 2 tablets (10 mg total) by mouth daily. 60 tablet 1   ??? sildenafiL, pulm.hypertension, (REVATIO) 20 mg tablet Take 1 tablet (20 mg total) by mouth Three (3) times a day. 90 tablet 11   ??? sodium chloride 7% 7 % Nebu Inhale 1 vial (4 mL) by nebulization Two (2) times a day. 720 mL 3     No current facility-administered medications for this visit.       Health Maintenance:     Health Maintenance Summary w/Most Recent Date       Status Date      HPV Cotest with Pap Smear (21-65) Overdue 06/15/1984     Pap Smear with Cotest HPV (21-65) Overdue 02/27/2001      Done 02/28/1996 Registry Metric: Pap Smear date     Done 02/28/1996 PAP SMEAR (HISTORICAL RESULT)    FIT-DNA Stool Test Overdue 06/15/2013     Zoster Vaccines Overdue 06/15/2013     DTaP/Tdap/Td Vaccines Overdue 07/07/2017      Done 07/08/2007 Imm Admin: TdaP    Hemoglobin A1c Next Due 08/29/2019      Done 05/29/2019 Registry Metric: Last Hemoglobin A1c Date     Done 05/29/2019 POCT GLYCOSYLATED HEMOGLOBIN (HGB A1C) HGB A1C, RAP/PDS           Done 11/01/2018 HEMOGLOBIN A1C Hemoglobin A1C           Done 03/05/2018 HEMOGLOBIN A1C Hemoglobin A1C           Done 11/05/2017 HEMOGLOBIN A1C Hemoglobin A1C           Patient has more history with this topic...    Foot Exam Next Due  11/01/2019      Done 11/01/2018 [facility] DIABETES FOOT EXAM     Done 11/05/2017 HM DIABETES FOOT EXAM     Done 06/14/2016 HM DIABETES FOOT EXAM     Done 11/05/2012 HM DIABETES FOOT EXAM    Urine Albumin/Creatinine Ratio Next Due 11/01/2019      Done 11/01/2018 ALBUMIN / CREATININE URINE RATIO Albumin/Creatinine Ratio           Done 11/05/2017 ALBUMIN / CREATININE URINE RATIO Albumin/Creatinine Ratio           Done 06/14/2016 ALBUMIN / CREATININE URINE RATIO Albumin/Creatinine Ratio          Retinal Eye Exam Next Due 11/15/2019      Done 11/15/2018 HM DIABETES EYE EXAM HM Diabetic Eye Exam           Done 04/12/2016 HM DIABETES EYE EXAM HM Diabetic Eye Exam           Done 06/18/2014 SmartData: OPHTH FUNDUS OD PERIPHERY     Done 06/18/2014 SmartData: OPHTH FUNDUS OS PERIPHERY     Done 06/18/2014 SmartData: FINDINGS - PE - EYES - FUNDUSCOPIC - PERIPHERY - LEFT PERIPHERY NORMAL    Mammogram Start Age 35 Next Due 02/29/2020      Done 02/28/2018 MAMMO SCREENING BILATERAL     Done 03/16/2015 MAMMO SCREENING BILATERAL    Serum Creatinine Monitoring Next Due 05/29/2020      Done 05/30/2019 Registry Metric: Serum creatinine     Done 08/02/2016 Ext Proc: CHG BASIC METABOLIC PANEL CALCIUM TOTAL     Done 05/30/2019 COMPREHENSIVE METABOLIC PANEL Creatinine           Done 04/29/2019 BASIC METABOLIC PANEL Creatinine           Done 08/28/2018 COMPREHENSIVE METABOLIC PANEL Creatinine           Patient has more history with this topic...    Potassium Monitoring Next Due 05/29/2020      Done 05/30/2019 Registry Metric: Potassium     Done 08/02/2016 Ext Proc: CHG BASIC METABOLIC PANEL CALCIUM TOTAL     Done 05/30/2019 COMPREHENSIVE METABOLIC PANEL Potassium           Done 04/29/2019 BASIC METABOLIC PANEL Potassium           Done 08/28/2018 COMPREHENSIVE METABOLIC PANEL Potassium           Patient has more history with this topic...    COPD Spirometry Next Due 01/04/2022      Done 01/04/2017 FLOW VOLUME LOOP     Done 12/16/2015 SPIROMETRY PRE / POST     Done 11/20/2014 FLOW VOLUME LOOP     Done 06/25/2013 SPIROMETRY    Pneumococcal Vaccine This plan is no longer active.      Done 01/03/2017 Imm Admin: PNEUMOCOCCAL POLYSACCHARIDE 23     Done 07/08/2007 Imm Admin: PNEUMOCOCCAL POLYSACCHARIDE 23    Hepatitis C Screen This plan is no longer active.      Done 01/08/2019 HEPATITIS C RNA, QUANTITATIVE, PCR     Done 11/24/2014 HEPATITIS C ANTIBODY Hepatitis C Ab           Done 11/18/2014 HEPATITIS C RNA, QUANTITATIVE, PCR     Done 11/17/2014 HEPATITIS C ANTIBODY Hepatitis C Ab           Done 11/02/2014 HEPATITIS C RNA, QUANTITATIVE, PCR     Patient has more history with this topic...    Influenza Vaccine This plan is no longer active.  Done 01/08/2019 Imm Admin: Influenza Vaccine Quad (IIV4 PF) 1mo+ injectable     Done 12/06/2017 Imm Admin: Influenza Vaccine Quad (IIV4 PF) 42mo+ injectable     Done 01/03/2017 Imm Admin: Influenza Vaccine Quad (IIV4 PF) 67mo+ injectable     Done 12/16/2015 Imm Admin: Influenza Vaccine Quad (IIV4 PF) 25mo+ injectable     Done 12/19/2014 Imm Admin: Influenza Vaccine Quad (IIV4 PF) 9mo+ injectable     Patient has more history with this topic...    COVID-19 Vaccine This plan is no longer active.      Done 07/02/2019 Imm Admin: COVID-19 VACC,MRNA,(PFIZER)(PF)(IM)     Done 06/11/2019 Imm Admin: COVID-19 VACC,MRNA,(PFIZER)(PF)(IM)          Immunizations:     Immunization History Administered Date(s) Administered   ??? COVID-19 VACC,MRNA,(PFIZER)(PF)(IM) 06/11/2019, 07/02/2019   ??? INFLUENZA TIV (TRI) PF (IM) 01/02/2005, 12/14/2008   ??? Influenza Vaccine Quad (IIV4 PF) 27mo+ injectable 11/28/2012, 12/19/2014, 12/16/2015, 01/03/2017, 12/06/2017, 01/08/2019   ??? PNEUMOCOCCAL POLYSACCHARIDE 23 07/08/2007, 01/03/2017   ??? PPD Test 01/19/2015   ??? TdaP 07/08/2007       I have reviewed and (if needed) updated the patient's problem list, medications, allergies, past medical and surgical history, social and family history.    ROS:      ROS  Comprehensive 10 point ROS negative unless otherwise stated in the HPI.       Vital Signs:     Wt Readings from Last 3 Encounters:   08/07/19 100.2 kg (221 lb)   05/30/19 (!) 107.6 kg (237 lb 4.8 oz)   05/29/19 (!) 109.8 kg (242 lb)     Temp Readings from Last 3 Encounters:   05/30/19 36.4 ??C (97.6 ??F)   05/29/19 36.9 ??C (98.4 ??F) (Oral)   04/29/19 36.7 ??C (98 ??F) (Oral)     BP Readings from Last 3 Encounters:   05/30/19 119/89   05/29/19 116/74   04/29/19 141/90     Pulse Readings from Last 3 Encounters:   05/30/19 98   05/29/19 75   04/29/19 90     Estimated body mass index is 30.82 kg/m?? as calculated from the following:    Height as of 08/07/19: 180.3 cm (5' 11).    Weight as of 08/07/19: 100.2 kg (221 lb).  No height and weight on file for this encounter.        Objective:      General: Alert and oriented x3. Well-appearing. No acute distress. ***  HEENT:  Normocephalic.  Atraumatic. Conjunctiva and sclera normal. OP MMM without lesions. ***  Neck:  Supple. No thyroid enlargement. No adenopathy. ***  Heart:  Regular rate and rhythm . Normal S1, S2.  No murmurs, rubs or gallops. ***  Lungs:  No respiratory distress.  Lungs clear to auscultation. No wheezes, rhonchi, or rales. ***  GI/GU:  Soft, +BS, nondistended, non-TTP. No palpable masses or organomegaly. ***  Extremities:  No edema. Peripheral pulses normal. ***  Skin:  Warm, dry. No rash or lesions present. *** Neuro:  Non-focal. No obvious weakness. ***  Psych:  Affect normal, eye contact good, speech clear and coherent. ***     I attest that I, Bayard Hugger, personally documented this note while acting as scribe for Noralyn Pick, FNP.      Bayard Hugger, Scribe.  08/08/2019     The documentation recorded by the scribe accurately reflects the service I personally performed and the decisions made by me.    Ernst Spell  Ninetta Lights, FNP

## 2019-08-07 NOTE — Unmapped (Addendum)
Thank you for completing your Medicare Annual Wellness Visit today. Please see below educational materials on health topics specific to you as well as resources discussed during today's call. If you have any questions or concerns, please reach out to our care manager, Beatriz Stallion, RN CM, via myChart or at [phone].     Here is your personalized prevention plan based on your Annual Wellness Visit today.    Medicare Screening & Prevention Guidelines Recommendations Last Date Completed HM Status and Next Due Follow-Up   Colorectal Cancer Screening Patients 50 to 75: stool cards annually OR colonoscopy every 10 years (or more frequently if high risk) OR FIT-DNA every 3 years.  Colonoscopy date: Not Found  FOBT/FIT date: Not Found  Sigmoidoscopy date: Not Found  FIT-DNA date: Not Found Health Maintenance Summary       Status Date      FIT-DNA Stool Test Overdue 06/15/2013        DUE - pt has it at home to complete    DEXA Bone Density Measurement Patients age 63-85 to have a DEXA every 5 years in postmenopausal women, males will defer to PCP. DEXA date: Not Found Health Maintenance Summary    Patient has no health maintenance due at this time      Not within age range   Diabetes Eye Exam Annually if Diabetic. Eye Exam date: 11/15/2018 Health Maintenance Summary       Status Date      Retinal Eye Exam Next Due 11/15/2019      Done 11/15/2018 HM DIABETES EYE EXAM HM Diabetic Eye Exam           Patient has more history with this topic...     Up to date - next due 11/15/2019   Diabetes Foot Exam Annually if Diabetic. Most Recent Foot Exam Date: 11/01/2018 Health Maintenance Summary       Status Date      Foot Exam Next Due 11/01/2019      Done 11/01/2018 HM DIABETES FOOT EXAM     Patient has more history with this topic...     Up to date - next due 11/01/2019   Diabetes Urine Albumin/Creatinine Ratio Annually if Diabetic UACR Date: 11/01/2018   Health Maintenance Summary       Status Date      Urine Albumin/Creatinine Ratio Next Due 11/01/2019      Done 11/01/2018 ALBUMIN / CREATININE URINE RATIO Albumin/Creatinine Ratio             Patient has more history with this topic...       Up to date   Diabetes Hemoglobin A1c Every 3 or 6 months depending on last result Last Hemoglobin A1c Date: 05/29/2019 Health Maintenance Summary       Status Date      Hemoglobin A1c Next Due 08/29/2019      Done 05/29/2019 Registry Metric: Last Hemoglobin A1c Date     Patient has more history with this topic...     Up to date   Heart Disease Screening (fasting lipid panel) Minimum of every 5 years, patients age 69-75,  if no apparent signs or symptoms of heart disease. LDL date: Not Found  Total choleseterol date: Not Found  HDL date: Not Found  Triglycerides date: Not Found Health Maintenance Summary    Patient has no health maintenance due at this time      Due at next visit   Mammogram Screening Age 63-74 every 2 years.  Mammogram  date: 02/28/2018 Health Maintenance Summary       Status Date      Mammogram Start Age 58 Next Due 02/29/2020      Done 02/28/2018 MAMMO SCREENING BILATERAL     Patient has more history with this topic...     Up to date - next due 02/29/2020   Pelvic Exam & Pap Smear Women ages 85 to 50 every 3 years with negative cytology (pap smear)  OR,   women ages 5 to 88 every 5 years if they have had both a negative pap and human papillomavirus (HPV) OR,  Every 3 years if they had a positive HPV result Pap Smear date: 02/28/1996  HPV date: Not Found Health Maintenance Summary       Status Date      HPV Cotest with Pap Smear (21-65) Overdue 06/15/1984     Pap Smear with Cotest HPV (21-65) Overdue 02/27/2001      Done 02/28/1996 Registry Metric: Pap Smear date     Patient has more history with this topic...     Due at next visit    Hepatitis C Screening A one-time screening for HCV infection for adults born between [birth year] & 1965. HCV screening date: 01/08/2019 Health Maintenance Summary       Status Date      Hepatitis C Screen This plan is no longer active.      Done 01/08/2019 HEPATITIS C RNA, QUANTITATIVE, PCR     Patient has more history with this topic...       Complete   Tdap Every 10 years (will not be covered by Medicare) DTap/Tdap/TD vaccination: 07/08/2007 Health Maintenance Summary       Status Date      DTaP/Tdap/Td Vaccines Overdue 07/07/2017      Done 07/08/2007 Imm Admin: TdaP     Provided information on how to obtain at pharmacy (tdap)   Influenza Vaccine Annually  Influenza Vaccination: 01/08/2019   Health Maintenance Summary       Status Date      Influenza Vaccine This plan is no longer active.      Done 01/08/2019 Imm Admin: Influenza Vaccine Quad (IIV4 PF) 1mo+   injectable     Patient has more history with this topic...       Up to date - next due around 11/2019   Prevnar and Pneumovax Vaccines Prevnar given at age 63 and Pneumovax given one year later. These vaccines may be given in a different sequence depending on chronic conditions. (utilize BPA for dosing & administration) Pneumonia vaccination: 01/03/2017   Health Maintenance Summary       Status Date      Pneumococcal Vaccine This plan is no longer active.      Done 01/03/2017 Imm Admin: PNEUMOCOCCAL POLYSACCHARIDE 23     Patient has more history with this topic...       Complete   Zoster Vaccine Healthy adults 50 years and older receive 2 doses of recombinant zoster vaccine two to six months apart (may not be covered by Medicare).  Zoster vaccination: Not Found Health Maintenance Summary       Status Date      Zoster Vaccines Overdue 06/15/2013      Provided information on how to obtain at pharmacy (shingrix)      Patient Education        Well Visit, Women 50 to 91: Care Instructions  Overview     Well visits can help you stay healthy.  Your doctor has checked your overall health and may have suggested ways to take good care of yourself. Your doctor also may have recommended tests. At home, you can help prevent illness with healthy eating, regular exercise, and other steps. Follow-up care is a key part of your treatment and safety. Be sure to make and go to all appointments, and call your doctor if you are having problems. It's also a good idea to know your test results and keep a list of the medicines you take.  How can you care for yourself at home?  ?? Get screening tests that you and your doctor decide on. Screening helps find diseases before any symptoms appear.  ?? Eat healthy foods. Choose fruits, vegetables, whole grains, protein, and low-fat dairy foods. Limit fat, especially saturated fat. Reduce salt in your diet.  ?? Limit alcohol. Have no more than 1 drink a day or 7 drinks a week.  ?? Get at least 30 minutes of exercise on most days of the week. Walking is a good choice. You also may want to do other activities, such as running, swimming, cycling, or playing tennis or team sports.  ?? Reach and stay at a healthy weight. This will lower your risk for many problems, such as obesity, diabetes, heart disease, and high blood pressure.  ?? Do not smoke. Smoking can make health problems worse. If you need help quitting, talk to your doctor about stop-smoking programs and medicines. These can increase your chances of quitting for good.  ?? Care for your mental health. It is easy to get weighed down by worry and stress. Learn strategies to manage stress, like deep breathing and mindfulness, and stay connected with your family and community. If you find you often feel sad or hopeless, talk with your doctor. Treatment can help.  ?? Talk to your doctor about whether you have any risk factors for sexually transmitted infections (STIs). You can help prevent STIs if you wait to have sex with a new partner (or partners) until you've each been tested for STIs. It also helps if you use condoms (female or female condoms) and if you limit your sex partners to one person who only has sex with you. Vaccines are available for some STIs.  ?? If you think you may have a problem with alcohol or drug use, talk to your doctor. This includes prescription medicines (such as amphetamines and opioids) and illegal drugs (such as cocaine and methamphetamine). Your doctor can help you figure out what type of treatment is best for you.  ?? Protect your skin from too much sun. When you're outdoors from 10 a.m. to 4 p.m., stay in the shade or cover up with clothing and a hat with a wide brim. Wear sunglasses that block UV rays. Even when it's cloudy, put broad-spectrum sunscreen (SPF 30 or higher) on any exposed skin.  ?? See a dentist one or two times a year for checkups and to have your teeth cleaned.  ?? Wear a seat belt in the car.  When should you call for help?  Watch closely for changes in your health, and be sure to contact your doctor if you have any problems or symptoms that concern you.  Where can you learn more?  Go to [facility] at https://myuncchart.org  Select Patient Education under [employer]. Enter [phone] in the search box to learn more about Well Visit, Women 50 to 70: Care Instructions.  Current as of: Aug 14, 2018??????????????????????????????Content Version: 12.8  ??  2006-2021 Healthwise, Incorporated.   Care instructions adapted under license by [facility] If you have questions about a medical condition or this instruction, always ask your healthcare professional. Healthwise, Incorporated disclaims any warranty or liability for your use of this information.       Patient Education        Food as Fuel: Care Instructions  Your Care Instructions     A healthy, balanced diet gives your body nutrients. Nutrients are like fuel for your body. They give you energy. And they keep your heart beating, your brain active, and your muscles working. They also help to build and strengthen bones, muscles, and other body tissues.  Your body needs three major nutrients for energy. These are carbohydrate, protein, and fat.  ?? Carbohydrate provides energy for your brain, muscles, heart, and lungs. It is found in bread, cereal, rice, pasta, fruits, vegetables, milk, yogurt, and sugar.  ?? Protein provides energy and helps build and repair your body's cells. It is found in meat, poultry, fish, cooked dry beans, cheese, tofu and other soy products, nuts and seeds, and milk and milk products.  ?? Fat provides energy, helps build the covering around nerves in your body, and helps make hormones. Fat is found in butter, margarine, oil, mayonnaise, salad dressing, and nuts. It is also found in most foods that come from animals, such as meat and milk products. Many foods also have fat added to them.  Your body needs all three of these nutrients to be healthy. If you choose a good mix of foods, you can help your body get the right amounts of carbohydrate, protein, and fat. It can also keep you at a healthy weight.  Follow-up care is a key part of your treatment and safety. Be sure to make and go to all appointments, and call your doctor if you are having problems. It's also a good idea to know your test results and keep a list of the medicines you take.  How can you care for yourself at home?  Eat a balanced diet  ?? Try to eat variety of healthy foods every day. These include:  ? 5 to 8 ounces of bread, cereal, crackers, rice, or pasta. An ounce is about 1 slice of bread, 1 cup of breakfast cereal, or ?? cup of cooked rice, cereal, or pasta. Choose whole-grain products for at least half of your grain servings.  ? 2 to 3 cups of vegetables. Be sure to include:  ?? Dark green vegetables such as broccoli and spinach.  ?? Orange vegetables such as carrots and sweet potatoes.  ? 1?? to 2 cups of fresh, frozen, or canned fruit. A large banana, a large orange, or a small apple equals 1 cup.  ? 3 cups of nonfat or low-fat milk, yogurt, or other milk products.  ? 5 to 6?? ounces of protein foods. These include chicken, fish, lean meat, beans, nuts, and seeds. One egg equals 1 ounce of meat, poultry, or fish. A ?? cup of cooked beans equals 2 ounces of protein.  Stay fueled all day  ?? Start your day with breakfast. If you don't have time to sit down for a bowl of cereal in the morning, try something that you can eat on the go. Try a piece of whole wheat bread with peanut butter or a container of yogurt with frozen berries mixed in.  ?? Eat regularly scheduled meals and snacks. If you miss a meal, you may overeat at the next  meal. Or you may choose a less healthy snack.  ?? Drink enough water. (If you have kidney, heart, or liver disease and have to limit fluids, talk with your doctor before you increase your fluid intake.)  Where can you learn more?  Go to [facility] at https://myuncchart.org  Select Patient Education under [employer]. Enter [id number] in the search box to learn more about Food as Fuel: Care Instructions.  Current as of: March 06, 2019??????????????????????????????Content Version: 12.8  ?? 2006-2021 Healthwise, Incorporated.   Care instructions adapted under license by [facility]. If you have questions about a medical condition or this instruction, always ask your healthcare professional. Healthwise, Incorporated disclaims any warranty or liability for your use of this information.         Patient Education        Learning About Being Physically Active  What is physical activity?     Being physically active means doing any kind of activity that gets your body moving.  The types of physical activity that can help you get fit and stay healthy include:  ?? Aerobic or cardio activities. These make your heart beat faster and make you breathe harder, such as brisk walking, riding a bike, or running. They strengthen your heart and lungs and build up your endurance.  ?? Strength training activities. These make your muscles work against, or resist, something. Examples include lifting weights or doing push-ups. These activities help tone and strengthen your muscles and bones.  ?? Stretches. These let you move your joints and muscles through their full range of motion. Stretching helps you be more flexible.  What are the benefits of being active?  Being active is one of the best things you can do for your health. It helps you to:  ?? Feel stronger and have more energy to do all the things you like to do.  ?? Focus better at school or work.  ?? Feel, think, and sleep better.  ?? Reach and stay at a healthy weight.  ?? Lose fat and build lean muscle.  ?? Lower your risk for serious health problems, including diabetes, heart attack, high blood pressure, and some cancers.  ?? Keep your heart, lungs, bones, muscles, and joints strong and healthy.  How can you make being active part of your life?  Start slowly. Make it your long-term goal to get at least 30 minutes of exercise on most days of the week. Walking is a good choice. You also may want to do other activities, such as running, swimming, cycling, or playing tennis or team sports.  Pick activities that you like???ones that make your heart beat faster, your muscles stronger, and your muscles and joints more flexible. If you find more than one thing you like doing, do them all. You don't have to do the same thing every day.  Get your heart pumping every day. Any activity that makes your heart beat faster and keeps it at that rate for a while counts.  Here are some great ways to get your heart beating faster:  ?? Go for a brisk walk, run, or bike ride.  ?? Go for a hike or swim.  ?? Go in-line skating.  ?? Play a game of touch football, basketball, or soccer.  ?? Ride a bike.  ?? Play tennis or racquetball.  ?? Climb stairs.  Even some household chores can be aerobic???just do them at a faster pace. Vacuuming, raking or mowing the lawn, sweeping the garage, and washing and  waxing the car all can help get your heart rate up.  Strengthen your muscles during the week. You don't have to lift heavy weights or grow big, bulky muscles to get stronger. Doing a few simple activities that make your muscles work against, or resist, something can help you get stronger.  For example, you can:  ?? Do push-ups or sit-ups, which use your own body weight as resistance.  ?? Lift weights or dumbbells or use stretch bands at home or in a gym or community center.  Stretch your muscles often. Stretching will help you as you become more active. It can help you stay flexible, loosen tight muscles, and avoid injury. It can also help improve your balance and posture and can be a great way to relax.  Be sure to stretch the muscles you'll be using when you work out. It's best to warm your muscles slightly before you stretch them. Walk or do some other light aerobic activity for a few minutes, and then start stretching.  When you stretch your muscles:  ?? Do it slowly. Stretching is not about going fast or making sudden movements.  ?? Don't push or bounce during a stretch.  ?? Hold each stretch for at least 15 to 30 seconds, if you can. You should feel a stretch in the muscle, but not pain.  ?? Breathe out as you do the stretch. Then breathe in as you hold the stretch. Don't hold your breath.  If you're worried about how more activity might affect your health, have a checkup before you start. Follow any special advice your doctor gives you for getting a smart start.  Where can you learn more?  Go to [facility] at [email or web address]  Select Patient Education under [employer]. Enter [phone] in the search box to learn more about Learning About Being Physically Active.  Current as of: November 28, 2018??????????????????????????????Content Version: 12.8  ?? 2006-2021 Healthwise, Incorporated.   Care instructions adapted under license by [facility]. If you have questions about a medical condition or this instruction, always ask your healthcare professional. Healthwise, Incorporated disclaims any warranty or liability for your use of this information.

## 2019-08-07 NOTE — Unmapped (Signed)
ADVANCE CARE PLANNING NOTE    Discussion Date:  Aug 07, 2019    Patient has decisional capacity:  Yes    Patient has selected a Health Care Decision-Maker if loses capacity: No    Health Care Decision Maker as of 08/07/2019      Discussion Participants:  Patient, CM    Communication of Medical Status/Prognosis:   Patient states health is fair related to her COPD/breathing issues.     Communication of Treatment Goals/Options:   ACP discussion deferred due to pt's age.     Treatment Decisions:   08/07/2019    Noralyn Pick, FNP was present and immediately available in office suite.    The patient reports Patient is under the age of 63. .              I spent between 1-15 minutes providing voluntary advance care planning services for this patient.

## 2019-08-08 NOTE — Unmapped (Signed)
[facility] Refill Coordination Note    Specialty Medication(s) to be Shipped:   CF/Pulmonary: -Nucala 100mg /ml     Karen Barber, DOB: Sep 03, 1965  Phone: [phone] (home) [phone] (work)    All above HIPAA information was verified with patient.     Was a Nurse, learning disability used for this call? No    Completed refill call assessment today to schedule patient's medication shipment from the [facility] [phone]).       Specialty medication(s) and dose(s) confirmed: Regimen is correct and unchanged.   Changes to medications: Karen Barber reports no changes at this time.  Changes to insurance: No  Questions for the pharmacist: No    Confirmed patient received Welcome Packet with first shipment. The patient will receive a drug information handout for each medication shipped and additional FDA Medication Guides as required.       DISEASE/MEDICATION-SPECIFIC INFORMATION        For CF patients: CF [facility] Active? No-not enrolled    SPECIALTY MEDICATION ADHERENCE     Medication Adherence    Patient reported X missed doses in the last month: 0  Specialty Medication: Nucala 100mg /ml  Patient is on additional specialty medications: No  Informant: patient  Reliability of informant: reliable        Nucala 100mg /ml : 0 days of medicine on hand     SHIPPING     Shipping address confirmed in Epic.     Delivery Scheduled: Yes, Expected medication delivery date: 08/12/2019.     Medication will be delivered via Same Day Courier to the prescription address in Epic WAM.    Karen Barber   [facility] Specialty Technician

## 2019-08-12 MED FILL — NUCALA 100 MG/ML SUBCUTANEOUS AUTO-INJECTOR: 28 days supply | Qty: 1 | Fill #1 | Status: AC

## 2019-08-12 MED FILL — NUCALA 100 MG/ML SUBCUTANEOUS AUTO-INJECTOR: ORAL | 28 days supply | Qty: 1 | Fill #1

## 2019-08-14 DIAGNOSIS — D860 Sarcoidosis of lung: Principal | ICD-10-CM

## 2019-08-14 MED ORDER — PREDNISONE 10 MG TABLET
ORAL_TABLET | ORAL | 0 refills | 0.00000 days | Status: CP
Start: 2019-08-14 — End: ?

## 2019-08-14 MED ORDER — PREDNISONE 5 MG TABLET
ORAL_TABLET | Freq: Every day | ORAL | 1 refills | 30.00000 days
Start: 2019-08-14 — End: ?

## 2019-08-14 NOTE — Unmapped (Signed)
Returned pt message from nurse line. Pt states that she has been feeling very tired as of late especially after exertion. Stated her stats drop to 70's-60's after walking to places such as her car or down the driveway. Stated she does use her POC when moving around and that at rest she maintains around 98% spo2. She does have and uses her rx/ inhalers.   Stated her tubing for o2 is getting old and that [facility] will not send her new tubing. She is requesting  To have her supplies through a new company.She is also requesting a rx of prednisone to possibly help.  I did advise that if she continues to feel tired and her stats remain/ continue to drop to go to nearest ED to be seen. Pt verbalized understanding and had no further questions/ concerns at this time.    Routing to provider for further advising and med refill if needed.

## 2019-08-15 MED ORDER — PREDNISONE 10 MG TABLET: tablet | 3 refills | 0 days | Status: AC

## 2019-08-15 MED ORDER — PREDNISONE 10 MG TABLET
ORAL_TABLET | ORAL | 3 refills | 0.00000 days | Status: CP
Start: 2019-08-15 — End: ?

## 2019-08-16 DIAGNOSIS — D860 Sarcoidosis of lung: Principal | ICD-10-CM

## 2019-08-16 MED ORDER — PREDNISONE 10 MG TABLET
ORAL_TABLET | 3 refills | 0 days | Status: CP
Start: 2019-08-16 — End: ?

## 2019-08-16 NOTE — Unmapped (Signed)
Pulmonology On-Call Telephone Note    Returned phone call placed to hospital operator.  Spoke with [name].    In brief, [name] is a 54 y.o. female who follows with Dr. 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     Patient reported that she was recently prescribed prednisone taper but she is unable to pick it up from the pharmacy for some reason.  On review of epic, Rx sent as print rather than as E-prescribe.  Instructed patient I would electronically signed prescription at this time.  Confirmed preferred pharmacy She had no other concerns.    All questions answered. This note will be routed to their primary pulmonologist.    Angelena Form, MD  Pulmonary & Critical Care Fellow, PGY4  08/16/2019

## 2019-08-19 MED ORDER — PREDNISONE 10 MG TABLET
ORAL_TABLET | 0 refills | 0 days
Start: 2019-08-19 — End: ?

## 2019-08-19 NOTE — Unmapped (Signed)
Prednisone refill completed by other means.

## 2019-08-21 NOTE — Unmapped (Signed)
ID Clinic Note - Follow-up Visit    ASSESSMENT AND PLAN:  54yoF w/ sarcoidosis and h/o chronic cavitary pulmonary aspergillosis in 2010 s/p LUL lobectomy w/ VATS on 12/03/14 who had frequent hospitalizations in 2018 for respiratory issues/infections, including in 09/2016 when she was found to have a L apical thick-walled cavitary lesion w/ adjacent lung parenchymal abnormalities and sputum culture positive for Aspergillus [place] and Achromobacter spp. Initially treated with voriconazole for chronic pulmonary aspergillosis. This was transitioned to posaconazole to reduce potential toxicities after discussing risks and benefits of stopping therapy completely as it was unlikely to be curative and she is not a transplant candidate due to anatomical concerns.    Continues to have easy fatigability, chronic DOE, cough, and increased O2 requirement from previous that has been stable over the last 8 months. She is understandably very frustrated with how poor she feels and the lack of improvement with this recent steroid burst. As she did not have a durable response to Nucala, asthma thought to be a less likely contributor (although she is still taking this medication). Also found to have mild pulm HTN on recent RHC - did not tolerate sildenafil due to vision changes. PET CT c/w metabolocally active sarcoid, so increased immunosuppression may be something that is needed in the future. Given known chronic aspergillosis, she would certainly be at risk for worsening infection, but is doing well on posaconazole tx. It may be worth the risk to decrease frequency of flares of respiratory sxs - will discuss with Ms. Flynt and Dr. Wellington Hampshire should the need for further immunosuppression arise.     Today:  - CONTINUE posaconazole 200mg  daily. Levels have been more than therapeutic, but she is not experiencing side effects. Discuss at each visit risks and benefits of trial off of this medication, elected to continue for now.   - Will check LFTs today for monitoring  - Repeat induced AFB and fungal sputum cultures today - could consider re-trial of treatment for M. fortuitum if again present in culture (although she did not notice significant improvement in symptoms with 3 month trail of antibiotics last year).  - Interested in 2nd opinion from [facility] re: any options to improve her chronic symptoms - have placed this referral today.      ID Problem List:  Sarcoidosis 1995 (lip biopsy); PAH; Severe Eosinophilic Asthma  - Only immunomodulator: prednisone 5-10mg  daily (currently on steroid burst)  - On home O2, now at 4L at rest but up to as much as 8L with minimal activity.  - Worsening respiratory symptoms w/ mediastinal lymphadenopathy and fibrosis progressive on 01/2018 CT concerning for progression of sarcoidosis  - PET CT 08/2018 with metabolically active pulm fibrosis and hilar/mediastional LNs  - Mild PAH on RHC 04/2019, but did not tolerate sildenafil  - Follows with Pulmonology, Dr. Kearney Hard --> Dr. Wellington Hampshire  - Not a lung transplant candidate 2/2 anatomy after past surgery  ??  Chronic cavitary pulmonary aspergillosis, first diagnosed 2010  - previously treated with multiple courses of voriconazole  - 2016 cultures with Aspergillus fumigatus  - s/p LUL lobectomy/VATS 12/03/14 after episodes of hemoptysis followed by voriconazole 11/20/14-04/2015   - New L apical thick walled cavitary lesion and adjacent lung parenchymal abnormalities 09/08/16 in setting of 1 month of worsening pulmonary??infectious??symptoms, leukocytosis (on steroids). Sputum cultures: Aspergillus [place], OPF. Treated with vanc/cefepime/flagyl --> Augmentin x 2 weeks, voriconazole  - Re-presented 09/25/16 w/ increasing cough, sputum production, and fatigue  - CT 09/25/16 with new mycetoma  in remaining left lung and worsening consolidation. LRCx Aspergillus [place]  - Continued on voriconazole since July 2018, troughs mostly therapeutic or just slightly subtherapeutic.  - 02/13/2018 CT: decreased size of L apical cavity soft tissue density  - Posaconazole 03/2018-present (dose decreased based on levels to 200mg  daily)  ??  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  - S/p 7 week trial of TMP/sulfa + moxifloxacin from 04/2018 - 06/2018 without improvement  ??  Pertinent past Infections  - H/o Achromobacter pneumonia 11/18/14, treated w meropenem x 14 d; 09/25/16, treated with 4 weeks meropenem  - H/o Stenotrophomonas (S: minocycline, I: levoflox R: bactrim, ceftaz)??PNA 12/03/14 treated with minocycline x 14d  - Hepatitis C Ab-positive, VL neg  - Nutritionally variant Strep bacteremia, 06/2014  ??  ??  Education and counseling took 30 minutes of today's visit.  ??  Duanne Limerick, MD, MHS  Fellow, [facility] Division of Infectious Diseases  ??  [facility]   1st floor [facility]  [address]  [place], [place]. [address]   Phone: [phone]   Fax: [phone]   ??  _____________________________________________________________________  ??  ??  CC: F/u of mycetoma, chronic cavitary pulmonary aspergillosis  ??  HPI: ??54yoF w/ h/o sarcoidosis, severe eosinophilic asthma, bronchiectasis, chronic cavitary pulmonary aspergillosis s/p resection in 2016 currently on posaconazole for suppressive therapy, and positive M.fortuitum sputum cultures. Initially had response to Nucala, but only temporarily, so asthma not thought to be driving cause of her chronic symptoms. Based on CT and PET scan findings in 2020, active pulm sarcoid thought to be primary driver of symptoms.    Since last visit in Mar 2021:  - Has continued on Nucala and sildenafil  - AWV with PCP in 07/2019  - 5/27/21L called pulm clinic reporting fatigue and low O2 sats after minimal exertion. Having issus with old O2 tubing and home medical supply company. Prescribed prednisone taper but had difficulty obtaining it because of a rxn issue so got it on 08/17/19.    Since starting prednisone, has not noted a significant change in her symptoms and has been on it for about 4 days, so did expect some improvement. She is understandably frustrated with how her symptoms have plateaued/slowly progressed over the last 8 months. She gets very tired and SOB with minimal exertion, which prevents her from being able to do the activities she likes to do. No fevers or increase in productive cough. Requires up to 8L O2 to recover from minimal exertion and associated drop in O2 sats.    Received both doses of COVID vaccine since our last visit, as did her partner.      Past Medical History:   Diagnosis Date   ??? 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 Breast injury     lung surg on left incision under breast sept 2016   ??? Caregiver burden     parents    ??? Hepatitis C antibody test positive 2016    repeatedly negative HCV RNA, indicating clearance of infection w/o treatment   ??? Hyperlipidemia    ??? Hypertension    ??? 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 On home O2 2008    per Dr Mal Amabile note from 02/21/2017   ??? Pulmonary aspergillosis (  CMS-HCC) 2016    A.fumigatus in 2016, prior to LUL lobectomy. A.niger from sputum 08/2016-09/2016.   ??? 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 Sarcoidosis 1995   ??? Type 2 diabetes mellitus with hyperglycemia (CMS-HCC) 07/27/2014   ??? Visual impairment     glasses       Past Surgical History:   Procedure Laterality Date   ??? LUNG LOBECTOMY     ??? PR RIGHT HEART CATH O2 SATURATION & CARDIAC OUTPUT N/A 04/29/2019    Procedure: Right Heart Catheterization;  Surgeon: Rosana Hoes, MD;  Location: [facility] CATH;  Service: Cardiology   ??? PR THORACOSCOPY SURG TOT PULM DECORT Left 12/14/2014    Procedure: THORACOSCOPY SURG; W/TOT PULM DECORTIC/PNEUMOLYS;  Surgeon: Evert Kohl, MD; Location: MAIN [facility];  Service: Cardiothoracic   ??? PR THORACOSCOPY W/THERA WEDGE RESEXN INITIAL UNILAT Left 12/03/2014    Procedure: THORACOSCOPY, SURGICAL; WITH THERAPEUTIC WEDGE RESECTION (EG, MASS, NODULE) INITIAL UNILATERAL;  Surgeon: Evert Kohl, MD;  Location: MAIN OR [facility];  Service: Cardiothoracic       No Known Allergies    Current Outpatient Medications   Medication Sig Dispense Refill   ??? ACCU-CHEK FASTCLIX LANCET DRUM Misc USE TO CHECK GLUCOSE 4 TIMES DAILY BEFORE MEAL(S) AND NIGHTLY     ??? ACCU-CHEK GUIDE GLUCOSE METER Misc 1 Device by Other route Four (4) times a day (before meals and nightly). AS DIRECTED 1 kit 0   ??? albuterol 2.5 mg /3 mL (0.083 %) nebulizer solution Inhale 3 mL (2.5 mg total) by nebulization every six (6) hours as needed for wheezing or shortness of breath (airway clearance/cough). 120 mL 0   ??? albuterol HFA 90 mcg/actuation inhaler Inhale 2 puffs every four (4) hours as needed for wheezing or shortness of breath. 8.5 g 3   ??? budesonide-formoteroL (SYMBICORT) 160-4.5 mcg/actuation inhaler Inhale 2 puffs Two (2) times a day. 20 g 4   ??? empty container Misc USE AS DIRECTED 1 each 2   ??? gabapentin (NEURONTIN) 300 MG capsule Take 1 capsule (300 mg total) by mouth Three (3) times a day. 270 capsule 1   ??? insulin ASPART (NOVOLOG U-100 INSULIN ASPART) 100 unit/mL injection Take 1-20 units SQ QID AC and HS as directed 80 mL 1   ??? insulin glargine (BASAGLAR, LANTUS) 100 unit/mL (3 mL) injection pen 18 units 30 pen 3   ??? ipratropium (ATROVENT) 0.02 % nebulizer solution Inhale 2.5 mL (500 mcg total) by nebulization Four (4) times a day. 300 mL 3   ??? ketoconazole (NIZORAL) 2 % cream Apply 1 application topically Two (2) times a day. Use for 2-3 weeks. 30 g 11   ??? MEDICAL SUPPLY ITEM Accucheck Glide Glocumeter for home glucose checks. Check blood glucose ACHS. 1 Package 0   ??? mepolizumab (NUCALA) 100 mg/mL AtIn Inject the contents of 1 syringe (100 mg) under the skin every twenty-eight (28) days. 1 mL 12   ??? metFORMIN (GLUCOPHAGE) 1000 MG tablet Take 1 tablet (1,000 mg total) by mouth 2 (two) times a day with meals. 180 tablet 1   ??? miscellaneous medical supply Misc Nebulizer machine and tubing. Patients machine is 56 years old. 1 each 0   ??? OXYGEN-AIR DELIVERY SYSTEMS MISC Dose: 2-3 LPM     ??? posaconazole (NOXAFIL) 100 mg TbEC delayed released tablet Take 200 mg by mouth daily. 60 tablet 2   ??? pravastatin (PRAVACHOL) 40 MG tablet Take 1 tablet (40 mg total) by mouth daily  with evening meal. 90 tablet 1   ??? predniSONE (DELTASONE) 10 MG tablet Take 4 tabs (40 mg) for 5 days, then take 3 tabs (30 mg) for 5 days, then take 2 tabs (20 mg) for 5 days, then take 1 tab (10 mg) daily until seen by Dr. Wellington Hampshire 200 tablet 3   ??? sildenafiL, pulm.hypertension, (REVATIO) 20 mg tablet Take 1 tablet (20 mg total) by mouth Three (3) times a day. 90 tablet 11   ??? blood sugar diagnostic Strp by Other route Four (4) times a day (before meals and nightly). 400 strip 1   ??? cyclobenzaprine (FLEXERIL) 10 MG tablet Take 1 tablet (10 mg total) by mouth Three (3) times a day as needed. 90 tablet 0   ??? EPINEPHrine (EPIPEN 2-PAK) 0.3 mg/0.3 mL injection Inject 0.3 mL (0.3 mg total) into the muscle once for 1 dose. (Patient not taking: Reported on 08/07/2019) 1 Device 1   ??? hydroCHLOROthiazide (HYDRODIURIL) 25 MG tablet Take 1 tablet (25 mg total) by mouth daily. TAKE ONE (1) TABLET BY MOUTH EVERY DAY 90 tablet 1     Current Facility-Administered Medications   Medication Dose Route Frequency Provider Last Rate Last Admin   ??? sodium chloride 3 % nebulizer solution 4 mL  4 mL Nebulization Once Melanee Left, MD           Social history:  Reviewed and unchanged except as noted in the HPI.    Family History   Problem Relation Age of Onset   ??? Diabetes Father    ??? Diabetes Brother    ??? Diabetes Maternal Aunt    ??? Sarcoidosis Maternal Aunt    ??? Diabetes Maternal Uncle    ??? Diabetes Paternal Aunt    ??? Diabetes Paternal Uncle        ROS: 12 systems reviewed and negative except as per HPI.     PE:  BP 153/91  - Pulse 111  - Temp 35.9 ??C (96.7 ??F) (Temporal)  - Ht 180.3 cm (5' 11)  - Wt (!) 104.3 kg (230 lb)  - LMP  (LMP Unknown)  - BMI 32.08 kg/m??   GEN: alert, NAD, speaking in full sentences  Eyes: sclera anicteric  ENT: nasal cannula in place  CV: RRR, no murmur  RESP: normal WOB at rest. Decreased breath sounds over L mid lung field. CTAB on the right without wheezes or crackles  PSYCH: somewhat depressed mood, but appropriate affect, good insight, interactive.    Labs:    Lab Results   Component Value Date    ALKPHOS 80 05/30/2019    BILITOT 0.5 08/22/2019    BILIDIR <0.10 05/30/2019    PROT 7.2 05/30/2019    ALBUMIN 4.0 05/30/2019    ALT 12 08/22/2019    AST 8 08/22/2019     Lab Results   Component Value Date    WBC 6.5 05/30/2019    HGB 12.6 05/30/2019    HCT 40.9 05/30/2019    MCV 86.3 05/30/2019    RDW 14.1 05/30/2019    PLT 256 05/30/2019    NEUTROPCT 62.9 05/30/2019    LYMPHOPCT 20.5 05/30/2019    MONOPCT 10.9 05/30/2019    EOSPCT 1.5 05/30/2019    BASOPCT 0.6 05/30/2019         Drug monitoring:  Posa levels   - 05/22/18: 2,552 --> dose decreased to 200mg  daily  - 08/28/18: 1,636 --> continue 200mg  daily  - 05/30/19 (random): 1,874 --> no change  -  07/10/19 (trough): 2,058 ---> no change as she is tolerating well.    Lab Results   Component Value Date    ALKPHOS 80 05/30/2019    BILITOT 0.5 08/22/2019    BILIDIR <0.10 05/30/2019    PROT 7.2 05/30/2019    ALBUMIN 4.0 05/30/2019    ALT 12 08/22/2019    AST 8 08/22/2019       Microbiology:  2020:  04/25/18: sputum AFB cx +mycobacterium fortuitum    2019:  02/14/18: OPF, fungal neg, AFB NGTD; galactomannan <0.5, fungitell neg  9/19, 9/29: AFB cx +mycobacterium fortuitum  Mycobacterium fortuitum group       MIC SUSCEPTIBILITY RESULT     Amikacin 2  Susceptible     Cefoxitin 16  Susceptible     Ciprofloxacin 0.5  Susceptible     Clarithromycin >16  Resistant1     Doxycycline 4  Intermediate     Imipenem 8  Intermediate2     Linezolid 2  Susceptible     Minocycline 2  Intermediate     Moxifloxacin 0.25  Susceptible     Tigecycline 0.03  No Interpretation     Trimethoprim + Sulfamethoxazole 0.25  Susceptible      2018:  02/19/17: OPF, fungal neg, AFB neg  7/12 LRCx: Aspergillus [place], 3+ Achromobacter species, 3+ Enterococcus faecium  7/10 Sputum fungal culture: Aspergillus [place]  7/9 LRCx: Aspergillus [place], OP flora, Achromobacter spp.    AFB smear x3 negative; cx NGTD      Imaging:   No recent imaging.

## 2019-08-22 ENCOUNTER — Ambulatory Visit: Admit: 2019-08-22 | Discharge: 2019-08-23 | Payer: MEDICARE

## 2019-08-22 DIAGNOSIS — R0600 Dyspnea, unspecified: Principal | ICD-10-CM

## 2019-08-22 DIAGNOSIS — D860 Sarcoidosis of lung: Principal | ICD-10-CM

## 2019-08-22 DIAGNOSIS — B4489 Other forms of aspergillosis: Principal | ICD-10-CM

## 2019-08-22 DIAGNOSIS — E1165 Type 2 diabetes mellitus with hyperglycemia: Principal | ICD-10-CM

## 2019-08-22 DIAGNOSIS — J984 Other disorders of lung: Principal | ICD-10-CM

## 2019-08-22 DIAGNOSIS — J4550 Severe persistent asthma, uncomplicated: Principal | ICD-10-CM

## 2019-08-22 DIAGNOSIS — Z7952 Long term (current) use of systemic steroids: Principal | ICD-10-CM

## 2019-08-22 DIAGNOSIS — Z794 Long term (current) use of insulin: Principal | ICD-10-CM

## 2019-08-22 DIAGNOSIS — B441 Other pulmonary aspergillosis: Principal | ICD-10-CM

## 2019-08-22 LAB — BILIRUBIN TOTAL: Bilirubin:MCnc:Pt:Ser/Plas:Qn:: 0.5

## 2019-08-22 LAB — AST (SGOT): Aspartate aminotransferase:CCnc:Pt:Ser/Plas:Qn:: 8

## 2019-08-22 LAB — ALT (SGPT): Alanine aminotransferase:CCnc:Pt:Ser/Plas:Qn:: 12

## 2019-08-25 MED ORDER — CYCLOBENZAPRINE 10 MG TABLET
ORAL_TABLET | Freq: Three times a day (TID) | ORAL | 0 refills | 30 days | Status: CP | PRN
Start: 2019-08-25 — End: 2020-08-24

## 2019-08-25 NOTE — Unmapped (Signed)
See Encounter from 08/08/2019

## 2019-08-25 NOTE — Unmapped (Signed)
P/C requesting refill on Flexeril , last ordered on 03/28/19, last visit 05/29/19.

## 2019-08-27 DIAGNOSIS — B441 Other pulmonary aspergillosis: Principal | ICD-10-CM

## 2019-08-27 DIAGNOSIS — D869 Sarcoidosis, unspecified: Principal | ICD-10-CM

## 2019-08-27 DIAGNOSIS — B353 Tinea pedis: Principal | ICD-10-CM

## 2019-08-27 MED ORDER — CYCLOBENZAPRINE 10 MG TABLET
ORAL_TABLET | Freq: Three times a day (TID) | ORAL | 0 refills | 30.00000 days | PRN
Start: 2019-08-27 — End: 2020-08-26

## 2019-08-27 MED ORDER — POSACONAZOLE 100 MG TABLET,DELAYED RELEASE
ORAL_TABLET | Freq: Every day | ORAL | 2 refills | 30.00000 days | Status: CP
Start: 2019-08-27 — End: ?

## 2019-08-27 MED ORDER — ALBUTEROL SULFATE HFA 90 MCG/ACTUATION AEROSOL INHALER
RESPIRATORY_TRACT | 3 refills | 0.00000 days | Status: CP | PRN
Start: 2019-08-27 — End: 2020-08-26

## 2019-08-27 MED ORDER — INSULIN ASPART U-100  100 UNIT/ML SUBCUTANEOUS SOLUTION
1 refills | 0 days | Status: CP
Start: 2019-08-27 — End: 2020-08-26

## 2019-08-27 MED ORDER — KETOCONAZOLE 2 % TOPICAL CREAM
Freq: Two times a day (BID) | TOPICAL | 11 refills | 30.00000 days
Start: 2019-08-27 — End: 2020-08-26

## 2019-08-28 MED ORDER — POSACONAZOLE 100 MG TABLET,DELAYED RELEASE
ORAL_TABLET | Freq: Every day | ORAL | 2 refills | 30.00000 days
Start: 2019-08-28 — End: ?

## 2019-08-28 MED ORDER — INSULIN GLARGINE (U-100) 100 UNIT/ML (3 ML) SUBCUTANEOUS PEN
0 refills | 0 days
Start: 2019-08-28 — End: ?

## 2019-08-28 MED ORDER — CYCLOBENZAPRINE 10 MG TABLET
ORAL_TABLET | Freq: Three times a day (TID) | ORAL | 0 refills | 30 days | PRN
Start: 2019-08-28 — End: 2020-08-27

## 2019-08-28 NOTE — Unmapped (Signed)
Sent several days ago by [employer]

## 2019-08-28 NOTE — Unmapped (Unsigned)
Assessment and Plan:     There are no diagnoses linked to this encounter.     HGB A1c *** (*** from 9.7 three months ago).   DM well controlled. Continue insulin aspart with sliding scale dosage (1-20 units) QID AC and HS, insulin glargine 18 units daily, and metformin 1000 mg BID.  Encouraged patient to continue carb controlled diet and regular exercise.     HPI:      Karen Barber  is here for No chief complaint on file.    Diabetes: Patient presents for follow up of diabetes.  A1C goal is <8.  Diabetes has customarily {been/not been:38678} at goal (complicated by: ***).  Current symptoms include: {dm sx:14075}. Symptoms have {symptom progression:19445}. Patient denies {dm sx:19199}. Evaluation to date has included: {dm labs:[phone]}.  Home sugars: {dm home sugars:14018}. Current treatment: Continued metformin and insulin  which has been {effective/ineffective:14021}.  Doing regular exercise: {yes no:22180}.     {kerichronicdx:74702}    {keriacutedx:74703}       [facility] Components:     Goals     ??? Get out more often and participate in more activities      Reviewed LS 08/07/19    Things to think about to help me reach my goal:     What are you going to do? Getting out more, exercise   How and how much? Treadmill   How frequent? daily   Barriers to success? breathing   Solutions to barriers?                Medication adherence and barriers to the treatment plan have been addressed. Opportunities to optimize healthy behaviors have been discussed. Patient / caregiver voiced understanding.      Past Medical/Surgical History:     Past Medical History:   Diagnosis Date   ??? 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 Breast injury     lung surg on left incision under breast sept 2016   ??? Caregiver burden     parents    ??? Hepatitis C antibody test positive 2016    repeatedly negative HCV RNA, indicating clearance of infection w/o treatment ??? Hyperlipidemia    ??? Hypertension    ??? 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 On home O2 2008    per Dr Mal Amabile note from 02/21/2017   ??? Pulmonary aspergillosis (CMS-HCC) 2016    A.fumigatus in 2016, prior to LUL lobectomy. A.niger from sputum 08/2016-09/2016.   ??? 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 Sarcoidosis 1995   ??? Type 2 diabetes mellitus with hyperglycemia (CMS-HCC) 07/27/2014   ??? Visual impairment     glasses     Past Surgical History:   Procedure Laterality Date   ??? LUNG LOBECTOMY     ??? PR RIGHT HEART CATH O2 SATURATION & CARDIAC OUTPUT N/A 04/29/2019    Procedure: Right Heart Catheterization;  Surgeon: Rosana Hoes, MD;  Location: [facility] CATH;  Service: Cardiology   ??? PR THORACOSCOPY SURG TOT PULM DECORT Left 12/14/2014    Procedure: THORACOSCOPY SURG; W/TOT PULM DECORTIC/PNEUMOLYS;  Surgeon: Evert Kohl, MD;  Location: MAIN OR [facility];  Service: Cardiothoracic   ??? PR THORACOSCOPY W/THERA WEDGE RESEXN INITIAL UNILAT Left 12/03/2014    Procedure: THORACOSCOPY, SURGICAL; WITH THERAPEUTIC WEDGE RESECTION (EG, MASS, NODULE) INITIAL UNILATERAL;  Surgeon: Evert Kohl, MD;  Location:  MAIN OR [facility];  Service: Cardiothoracic       Family History:     Family History   Problem Relation Age of Onset   ??? Diabetes Father    ??? Diabetes Brother    ??? Diabetes Maternal Aunt    ??? Sarcoidosis Maternal Aunt    ??? Diabetes Maternal Uncle    ??? Diabetes Paternal Aunt    ??? Diabetes Paternal Uncle        Social History:     Social History     Socioeconomic History   ??? Marital status: Single     Spouse name: Not on file   ??? Number of children: Not on file   ??? Years of education: Not on file   ??? Highest education level: Not on file   Occupational History   ??? Not on file   Tobacco Use   ??? Smoking status: Former Smoker     Packs/day: 0.50     Years: 9.00     Pack years: 4.50     Types: Cigarettes     Quit date: 05/03/1987     Years since quitting: 32.3   ??? Smokeless tobacco: Never Used   Vaping Use   ??? Vaping Use: Never used   Substance and Sexual Activity   ??? Alcohol use: No     Alcohol/week: 0.0 standard drinks   ??? Drug use: No   ??? Sexual activity: Yes   Other Topics Concern   ??? Do you use sunscreen? No   ??? Tanning bed use? No   ??? Are you easily burned? No   ??? Excessive sun exposure? No   ??? Blistering sunburns? No   Social History Narrative   ??? Not on file     Social Determinants of Health     Financial Resource Strain: Low Risk    ??? Difficulty of Paying Living Expenses: Not hard at all   Food Insecurity: No Food Insecurity   ??? Worried About Programme researcher, broadcasting/film/video in the Last Year: Never true   ??? Ran Out of Food in the Last Year: Never true   Transportation Needs: No Transportation Needs   ??? Lack of Transportation (Medical): No   ??? Lack of Transportation (Non-Medical): No   Physical Activity:    ??? Days of Exercise per Week:    ??? Minutes of Exercise per Session:    Stress:    ??? Feeling of Stress :    Social Connections:    ??? Frequency of Communication with Friends and Family:    ??? Frequency of Social Gatherings with Friends and Family:    ??? Attends Religious Services:    ??? Database administrator or Organizations:    ??? Attends Banker Meetings:    ??? Marital Status:        Allergies:     Patient has no known allergies.    Current Medications:     Current Outpatient Medications   Medication Sig Dispense Refill   ??? ACCU-CHEK FASTCLIX LANCET DRUM Misc USE TO CHECK GLUCOSE 4 TIMES DAILY BEFORE MEAL(S) AND NIGHTLY     ??? ACCU-CHEK GUIDE GLUCOSE METER Misc 1 Device by Other route Four (4) times a day (before meals and nightly). AS DIRECTED 1 kit 0   ??? albuterol 2.5 mg /3 mL (0.083 %) nebulizer solution Inhale 3 mL (2.5 mg total) by nebulization every six (6) hours as needed for wheezing or shortness of breath (airway clearance/cough). 120 mL  0   ??? albuterol HFA 90 mcg/actuation inhaler Inhale 2 puffs every four (4) hours as needed for wheezing or shortness of breath. 8.5 g 3 ??? blood sugar diagnostic Strp by Other route Four (4) times a day (before meals and nightly). 400 strip 1   ??? budesonide-formoteroL (SYMBICORT) 160-4.5 mcg/actuation inhaler Inhale 2 puffs Two (2) times a day. 20 g 4   ??? cyclobenzaprine (FLEXERIL) 10 MG tablet Take 1 tablet (10 mg total) by mouth Three (3) times a day as needed. 90 tablet 0   ??? empty container Misc USE AS DIRECTED 1 each 2   ??? EPINEPHrine (EPIPEN 2-PAK) 0.3 mg/0.3 mL injection Inject 0.3 mL (0.3 mg total) into the muscle once for 1 dose. (Patient not taking: Reported on 08/07/2019) 1 Device 1   ??? gabapentin (NEURONTIN) 300 MG capsule Take 1 capsule (300 mg total) by mouth Three (3) times a day. 270 capsule 1   ??? hydroCHLOROthiazide (HYDRODIURIL) 25 MG tablet Take 1 tablet (25 mg total) by mouth daily. TAKE ONE (1) TABLET BY MOUTH EVERY DAY 90 tablet 1   ??? insulin ASPART (NOVOLOG U-100 INSULIN ASPART) 100 unit/mL injection Take 1-20 units SQ QID AC and HS as directed 80 mL 1   ??? insulin glargine (BASAGLAR, LANTUS) 100 unit/mL (3 mL) injection pen 18 units 30 pen 3   ??? ipratropium (ATROVENT) 0.02 % nebulizer solution Inhale 2.5 mL (500 mcg total) by nebulization Four (4) times a day. 300 mL 3   ??? ketoconazole (NIZORAL) 2 % cream Apply 1 application topically Two (2) times a day. Use for 2-3 weeks. 30 g 11   ??? MEDICAL SUPPLY ITEM Accucheck Glide Glocumeter for home glucose checks. Check blood glucose ACHS. 1 Package 0   ??? mepolizumab (NUCALA) 100 mg/mL AtIn Inject the contents of 1 syringe (100 mg) under the skin every twenty-eight (28) days. 1 mL 12   ??? metFORMIN (GLUCOPHAGE) 1000 MG tablet Take 1 tablet (1,000 mg total) by mouth 2 (two) times a day with meals. 180 tablet 1   ??? miscellaneous medical supply Misc Nebulizer machine and tubing. Patients machine is 56 years old. 1 each 0   ??? OXYGEN-AIR DELIVERY SYSTEMS MISC Dose: 2-3 LPM     ??? posaconazole (NOXAFIL) 100 mg TbEC delayed released tablet Take 200 mg by mouth daily. 60 tablet 2   ??? pravastatin (PRAVACHOL) 40 MG tablet Take 1 tablet (40 mg total) by mouth daily with evening meal. 90 tablet 1   ??? predniSONE (DELTASONE) 10 MG tablet Take 4 tabs (40 mg) for 5 days, then take 3 tabs (30 mg) for 5 days, then take 2 tabs (20 mg) for 5 days, then take 1 tab (10 mg) daily until seen by Dr. Wellington Hampshire 200 tablet 3   ??? sildenafiL, pulm.hypertension, (REVATIO) 20 mg tablet Take 1 tablet (20 mg total) by mouth Three (3) times a day. 90 tablet 11     Current Facility-Administered Medications   Medication Dose Route Frequency Provider Last Rate Last Admin   ??? sodium chloride 3 % nebulizer solution 4 mL  4 mL Nebulization Once Melanee Left, MD           Health Maintenance:     Health Maintenance Summary w/Most Recent Date       Status Date      HPV Cotest with Pap Smear (21-65) Overdue 06/15/1984     Pap Smear with Cotest HPV (21-65) Overdue 02/27/2001      Done  02/28/1996 Registry Metric: Pap Smear date     Done 02/28/1996 PAP SMEAR (HISTORICAL RESULT)    FIT-DNA Stool Test Overdue 06/15/2013     Zoster Vaccines Overdue 06/15/2013     DTaP/Tdap/Td Vaccines Overdue 07/07/2017      Done 07/08/2007 Imm Admin: TdaP    Hemoglobin A1c Next Due 08/29/2019      Done 05/29/2019 Registry Metric: Last Hemoglobin A1c Date     Done 05/29/2019 POCT GLYCOSYLATED HEMOGLOBIN (HGB A1C) HGB A1C, RAP/PDS           Done 11/01/2018 HEMOGLOBIN A1C Hemoglobin A1C           Done 03/05/2018 HEMOGLOBIN A1C Hemoglobin A1C           Done 11/05/2017 HEMOGLOBIN A1C Hemoglobin A1C           Patient has more history with this topic...    Foot Exam Next Due 11/01/2019      Done 11/01/2018 HM DIABETES FOOT EXAM     Done 11/05/2017 HM DIABETES FOOT EXAM     Done 06/14/2016 HM DIABETES FOOT EXAM     Done 11/05/2012 HM DIABETES FOOT EXAM    Urine Albumin/Creatinine Ratio Next Due 11/01/2019      Done 11/01/2018 Registry Metric: [facility] DM LAST ALBQTUR     Done 11/01/2018 ALBUMIN / CREATININE URINE RATIO Albumin/Creatinine Ratio           Done 11/05/2017 ALBUMIN / CREATININE URINE RATIO Albumin/Creatinine Ratio           Done 06/14/2016 ALBUMIN / CREATININE URINE RATIO Albumin/Creatinine Ratio          Retinal Eye Exam Next Due 11/15/2019      Done 11/15/2018 HM DIABETES EYE EXAM HM Diabetic Eye Exam           Done 04/12/2016 HM DIABETES EYE EXAM HM Diabetic Eye Exam           Done 06/18/2014 SmartData: OPHTH FUNDUS OD PERIPHERY     Done 06/18/2014 SmartData: OPHTH FUNDUS OS PERIPHERY     Done 06/18/2014 SmartData: FINDINGS - PE - EYES - FUNDUSCOPIC - PERIPHERY - LEFT PERIPHERY NORMAL    Mammogram Start Age 55 Next Due 02/29/2020      Done 02/28/2018 MAMMO SCREENING BILATERAL     Done 03/16/2015 MAMMO SCREENING BILATERAL    Serum Creatinine Monitoring Next Due 05/29/2020      Done 05/30/2019 Registry Metric: Serum creatinine     Done 08/02/2016 Ext Proc: CHG BASIC METABOLIC PANEL CALCIUM TOTAL     Done 05/30/2019 COMPREHENSIVE METABOLIC PANEL Creatinine           Done 04/29/2019 BASIC METABOLIC PANEL Creatinine           Done 08/28/2018 COMPREHENSIVE METABOLIC PANEL Creatinine           Patient has more history with this topic...    Potassium Monitoring Next Due 05/29/2020      Done 05/30/2019 Registry Metric: Potassium     Done 08/02/2016 Ext Proc: CHG BASIC METABOLIC PANEL CALCIUM TOTAL     Done 05/30/2019 COMPREHENSIVE METABOLIC PANEL Potassium           Done 04/29/2019 BASIC METABOLIC PANEL Potassium           Done 08/28/2018 COMPREHENSIVE METABOLIC PANEL Potassium           Patient has more history with this topic...    COPD Spirometry Next Due 01/04/2022      Done 01/04/2017 FLOW VOLUME  LOOP     Done 12/16/2015 SPIROMETRY PRE / POST     Done 11/20/2014 FLOW VOLUME LOOP     Done 06/25/2013 SPIROMETRY    Pneumococcal Vaccine This plan is no longer active.      Done 01/03/2017 Imm Admin: PNEUMOCOCCAL POLYSACCHARIDE 23     Done 07/08/2007 Imm Admin: PNEUMOCOCCAL POLYSACCHARIDE 23    Hepatitis C Screen This plan is no longer active.      Done 01/08/2019 HEPATITIS C RNA, QUANTITATIVE, PCR     Done 11/24/2014 HEPATITIS C ANTIBODY Hepatitis C Ab           Done 11/18/2014 HEPATITIS C RNA, QUANTITATIVE, PCR     Done 11/17/2014 HEPATITIS C ANTIBODY Hepatitis C Ab           Done 11/02/2014 HEPATITIS C RNA, QUANTITATIVE, PCR     Patient has more history with this topic...    Influenza Vaccine This plan is no longer active.      Done 01/08/2019 Imm Admin: Influenza Vaccine Quad (IIV4 PF) 14mo+ injectable     Done 12/06/2017 Imm Admin: Influenza Vaccine Quad (IIV4 PF) 62mo+ injectable     Done 01/03/2017 Imm Admin: Influenza Vaccine Quad (IIV4 PF) 12mo+ injectable     Done 12/16/2015 Imm Admin: Influenza Vaccine Quad (IIV4 PF) 24mo+ injectable     Done 12/19/2014 Imm Admin: Influenza Vaccine Quad (IIV4 PF) 72mo+ injectable     Patient has more history with this topic...    COVID-19 Vaccine This plan is no longer active.      Done 07/02/2019 Imm Admin: COVID-19 VACC,MRNA,(PFIZER)(PF)(IM)     Done 06/11/2019 Imm Admin: COVID-19 VACC,MRNA,(PFIZER)(PF)(IM)          Immunizations:     Immunization History   Administered Date(s) Administered   ??? COVID-19 VACC,MRNA,(PFIZER)(PF)(IM) 06/11/2019, 07/02/2019   ??? INFLUENZA TIV (TRI) PF (IM) 01/02/2005, 12/14/2008   ??? Influenza Vaccine Quad (IIV4 PF) 27mo+ injectable 11/28/2012, 12/19/2014, 12/16/2015, 01/03/2017, 12/06/2017, 01/08/2019   ??? PNEUMOCOCCAL POLYSACCHARIDE 23 07/08/2007, 01/03/2017   ??? PPD Test 01/19/2015   ??? TdaP 07/08/2007       I have reviewed and (if needed) updated the patient's problem list, medications, allergies, past medical and surgical history, social and family history.    ROS:      ROS  Comprehensive 10 point ROS negative unless otherwise stated in the HPI.       Vital Signs:     Wt Readings from Last 3 Encounters:   08/22/19 (!) 104.3 kg (230 lb)   08/07/19 100.2 kg (221 lb)   05/30/19 (!) 107.6 kg (237 lb 4.8 oz)     Temp Readings from Last 3 Encounters:   08/22/19 35.9 ??C (96.7 ??F) (Temporal)   05/30/19 36.4 ??C (97.6 ??F)   05/29/19 36.9 ??C (98.4 ??F) (Oral)     BP Readings from Last 3 Encounters:   08/22/19 153/91   05/30/19 119/89   05/29/19 116/74     Pulse Readings from Last 3 Encounters:   08/22/19 111   05/30/19 98   05/29/19 75     Estimated body mass index is 32.08 kg/m?? as calculated from the following:    Height as of 08/22/19: 180.3 cm (5' 11).    Weight as of 08/22/19: 104.3 kg (230 lb).  No height and weight on file for this encounter.        Objective:      General: Alert and oriented x3. Well-appearing. No acute distress. ***  HEENT:  Normocephalic.  Atraumatic. Conjunctiva and sclera normal. OP MMM without lesions. ***  Neck:  Supple. No thyroid enlargement. No adenopathy. ***  Heart:  Regular rate and rhythm . Normal S1, S2.  No murmurs, rubs or gallops. ***  Lungs:  No respiratory distress.  Lungs clear to auscultation. No wheezes, rhonchi, or rales. ***  GI/GU:  Soft, +BS, nondistended, non-TTP. No palpable masses or organomegaly. ***  Extremities:  No edema. Peripheral pulses normal. ***  Skin:  Warm, dry. No rash or lesions present. ***  Neuro:  Non-focal. No obvious weakness. ***  Psych:  Affect normal, eye contact good, speech clear and coherent. ***     I attest that I, Bayard Hugger, personally documented this note while acting as scribe for Noralyn Pick, FNP.      Bayard Hugger, Scribe.  08/29/2019     The documentation recorded by the scribe accurately reflects the service I personally performed and the decisions made by me.    Noralyn Pick, FNP

## 2019-08-28 NOTE — Unmapped (Signed)
Not an [facility] patient.

## 2019-09-01 NOTE — Unmapped (Signed)
[place] [facility] ([facility])                   RECRUITMENT SUMMARY NOTE       Patient was identified for CAMP services. In-basket message sent to embedded CPP.         Tenna Delaine, CPhT  Clinical Operations Specialist  [place] [facility] (CAMP)  p [phone]  -  f [phone]

## 2019-09-04 NOTE — Unmapped (Signed)
[facility] Refill Coordination Note    Specialty Medication(s) to be Shipped:   CF/Pulmonary: -Nucala 100mg /ml     Karen Barber, DOB: April 25, 1965  Phone: [phone] (home) [phone] (work)    All above HIPAA information was verified with patient.     Was a Nurse, learning disability used for this call? No    Completed refill call assessment today to schedule patient's medication shipment from the [facility] [phone]).       Specialty medication(s) and dose(s) confirmed: Regimen is correct and unchanged.   Changes to medications: Karen Barber reports no changes at this time.  Changes to insurance: No  Questions for the pharmacist: No    Confirmed patient received Welcome Packet with first shipment. The patient will receive a drug information handout for each medication shipped and additional FDA Medication Guides as required.       DISEASE/MEDICATION-SPECIFIC INFORMATION        For CF patients: CF [facility] Active? No-not enrolled      Next injection date is: 09/15/2019    SPECIALTY MEDICATION ADHERENCE     Medication Adherence    Patient reported X missed doses in the last month: 0  Specialty Medication: Nucala 100mg /ml  Patient is on additional specialty medications: No  Informant: patient  Reliability of informant: reliable        Nucala 100mg /ml : 0 days of medicine on hand     SHIPPING     Shipping address confirmed in Epic.     Delivery Scheduled: Yes, Expected medication delivery date: 09/10/2019.     Medication will be delivered via Same Day Courier to the prescription address in Epic WAM.    Karen Barber Karen Barber   [facility] Shared [facility] Specialty Technician

## 2019-09-04 NOTE — Unmapped (Unsigned)
Assessment and Plan:     There are no diagnoses linked to this encounter.     HGB A1c *** (*** from 9.7 three months ago).   DM well controlled. Continue insulin aspart with sliding scale dosage (1-20 units)??QID AC and HS,??insulin glargine??18 units daily, and metformin 1000 mg BID.  Encouraged patient to continue carb controlled diet and regular exercise.     BP at goal (***/*** in clinic today). Continue HCTZ 25 mg daily. Reviewed low sodium diet and encouraged regular exercise. Advised to continue to monitor and log at-home BP readings.    HPI:      Karen Barber  is here for No chief complaint on file.    Diabetes: Patient presents for follow up of diabetes.  A1C goal is <8.  Diabetes has customarily {been/not been:38678} at goal (complicated by: ***).  Current symptoms include: {dm sx:14075}. Symptoms have {symptom progression:19445}. Patient denies {dm sx:19199}. Evaluation to date has included: {dm labs:3673868297}.  Home sugars: {dm home sugars:14018}. Current treatment: {km dm tx:73344} which has been {effective/ineffective:14021}.  Doing regular exercise: {yes no:22180}.     Hypertension: Patient presents for follow-up of hypertension. Blood pressure goal < 140/90.  Hypertension has customarily {been/notbeen:38678} at goal complicated by ***.  Home blood pressure readings: {home bp readings:17448}. Salt intake and diet: {salt intake/diet:17449}. Associated signs and symptoms: {symptoms hypertension:17452}. Patient denies: {symptoms hypertension:19611}. Medication compliance: {med compliance:10573}. She {is/is not:23060} doing regular exercise.      {kerichronicdx:74702}    {keriacutedx:74703}       [facility] Components:     Goals     ??? Get out more often and participate in more activities      Reviewed LS 08/07/19    Things to think about to help me reach my goal:     What are you going to do? Getting out more, exercise   How and how much? Treadmill   How frequent? daily   Barriers to success? breathing Solutions to barriers?                Medication adherence and barriers to the treatment plan have been addressed. Opportunities to optimize healthy behaviors have been discussed. Patient / caregiver voiced understanding.      Past Medical/Surgical History:     Past Medical History:   Diagnosis Date   ??? 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 Breast injury     lung surg on left incision under breast sept 2016   ??? Caregiver burden     parents    ??? Hepatitis C antibody test positive 2016    repeatedly negative HCV RNA, indicating clearance of infection w/o treatment   ??? Hyperlipidemia    ??? Hypertension    ??? 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 On home O2 2008    per Dr Mal Amabile note from 02/21/2017   ??? Pulmonary aspergillosis (CMS-HCC) 2016    A.fumigatus in 2016, prior to LUL lobectomy. A.niger from sputum 08/2016-09/2016.   ??? 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 Sarcoidosis 1995   ??? Type 2 diabetes mellitus with hyperglycemia (CMS-HCC) 07/27/2014   ??? Visual impairment     glasses     Past Surgical History:   Procedure Laterality Date   ??? LUNG LOBECTOMY     ??? PR RIGHT HEART CATH O2 SATURATION & CARDIAC OUTPUT N/A 04/29/2019  Procedure: Right Heart Catheterization;  Surgeon: Rosana Hoes, MD;  Location: [facility] CATH;  Service: Cardiology   ??? PR THORACOSCOPY SURG TOT PULM DECORT Left 12/14/2014    Procedure: THORACOSCOPY SURG; W/TOT PULM DECORTIC/PNEUMOLYS;  Surgeon: Evert Kohl, MD;  Location: MAIN OR [facility];  Service: Cardiothoracic   ??? PR THORACOSCOPY W/THERA WEDGE RESEXN INITIAL UNILAT Left 12/03/2014    Procedure: THORACOSCOPY, SURGICAL; WITH THERAPEUTIC WEDGE RESECTION (EG, MASS, NODULE) INITIAL UNILATERAL;  Surgeon: Evert Kohl, MD;  Location: MAIN OR [facility];  Service: Cardiothoracic       Family History:     Family History   Problem Relation Age of Onset   ??? Diabetes Father    ??? Diabetes Brother    ??? Diabetes Maternal Aunt    ??? Sarcoidosis Maternal Aunt    ??? Diabetes Maternal Uncle    ??? Diabetes Paternal Aunt    ??? Diabetes Paternal Uncle        Social History:     Social History     Socioeconomic History   ??? Marital status: Single     Spouse name: Not on file   ??? Number of children: Not on file   ??? Years of education: Not on file   ??? Highest education level: Not on file   Occupational History   ??? Not on file   Tobacco Use   ??? Smoking status: Former Smoker     Packs/day: 0.50     Years: 9.00     Pack years: 4.50     Types: Cigarettes     Quit date: 05/03/1987     Years since quitting: 32.3   ??? Smokeless tobacco: Never Used   Vaping Use   ??? Vaping Use: Never used   Substance and Sexual Activity   ??? Alcohol use: No     Alcohol/week: 0.0 standard drinks   ??? Drug use: No   ??? Sexual activity: Yes   Other Topics Concern   ??? Do you use sunscreen? No   ??? Tanning bed use? No   ??? Are you easily burned? No   ??? Excessive sun exposure? No   ??? Blistering sunburns? No   Social History Narrative   ??? Not on file     Social Determinants of Health     Financial Resource Strain: Low Risk    ??? Difficulty of Paying Living Expenses: Not hard at all   Food Insecurity: No Food Insecurity   ??? Worried About Programme researcher, broadcasting/film/video in the Last Year: Never true   ??? Ran Out of Food in the Last Year: Never true   Transportation Needs: No Transportation Needs   ??? Lack of Transportation (Medical): No   ??? Lack of Transportation (Non-Medical): No   Physical Activity:    ??? Days of Exercise per Week:    ??? Minutes of Exercise per Session:    Stress:    ??? Feeling of Stress :    Social Connections:    ??? Frequency of Communication with Friends and Family:    ??? Frequency of Social Gatherings with Friends and Family:    ??? Attends Religious Services:    ??? Database administrator or Organizations:    ??? Attends Banker Meetings:    ??? Marital Status:        Allergies:     Patient has no known allergies. Current Medications:     Current Outpatient Medications   Medication Sig Dispense Refill   ???  ACCU-CHEK FASTCLIX LANCET DRUM Misc USE TO CHECK GLUCOSE 4 TIMES DAILY BEFORE MEAL(S) AND NIGHTLY     ??? ACCU-CHEK GUIDE GLUCOSE METER Misc 1 Device by Other route Four (4) times a day (before meals and nightly). AS DIRECTED 1 kit 0   ??? albuterol 2.5 mg /3 mL (0.083 %) nebulizer solution Inhale 3 mL (2.5 mg total) by nebulization every six (6) hours as needed for wheezing or shortness of breath (airway clearance/cough). 120 mL 0   ??? albuterol HFA 90 mcg/actuation inhaler Inhale 2 puffs every four (4) hours as needed for wheezing or shortness of breath. 8.5 g 3   ??? blood sugar diagnostic Strp by Other route Four (4) times a day (before meals and nightly). 400 strip 1   ??? budesonide-formoteroL (SYMBICORT) 160-4.5 mcg/actuation inhaler Inhale 2 puffs Two (2) times a day. 20 g 4   ??? cyclobenzaprine (FLEXERIL) 10 MG tablet Take 1 tablet (10 mg total) by mouth Three (3) times a day as needed. 90 tablet 0   ??? empty container Misc USE AS DIRECTED 1 each 2   ??? EPINEPHrine (EPIPEN 2-PAK) 0.3 mg/0.3 mL injection Inject 0.3 mL (0.3 mg total) into the muscle once for 1 dose. (Patient not taking: Reported on 08/07/2019) 1 Device 1   ??? gabapentin (NEURONTIN) 300 MG capsule Take 1 capsule (300 mg total) by mouth Three (3) times a day. 270 capsule 1   ??? hydroCHLOROthiazide (HYDRODIURIL) 25 MG tablet Take 1 tablet (25 mg total) by mouth daily. TAKE ONE (1) TABLET BY MOUTH EVERY DAY 90 tablet 1   ??? insulin ASPART (NOVOLOG U-100 INSULIN ASPART) 100 unit/mL injection Take 1-20 units SQ QID AC and HS as directed 80 mL 1   ??? insulin glargine (BASAGLAR, LANTUS) 100 unit/mL (3 mL) injection pen 18 units 30 pen 3   ??? ipratropium (ATROVENT) 0.02 % nebulizer solution Inhale 2.5 mL (500 mcg total) by nebulization Four (4) times a day. 300 mL 3   ??? ketoconazole (NIZORAL) 2 % cream Apply 1 application topically Two (2) times a day. Use for 2-3 weeks. 30 g 11   ??? MEDICAL SUPPLY ITEM Accucheck Glide Glocumeter for home glucose checks. Check blood glucose ACHS. 1 Package 0   ??? mepolizumab (NUCALA) 100 mg/mL AtIn Inject the contents of 1 syringe (100 mg) under the skin every twenty-eight (28) days. 1 mL 12   ??? metFORMIN (GLUCOPHAGE) 1000 MG tablet Take 1 tablet (1,000 mg total) by mouth 2 (two) times a day with meals. 180 tablet 1   ??? miscellaneous medical supply Misc Nebulizer machine and tubing. Patients machine is 56 years old. 1 each 0   ??? OXYGEN-AIR DELIVERY SYSTEMS MISC Dose: 2-3 LPM     ??? posaconazole (NOXAFIL) 100 mg TbEC delayed released tablet Take 200 mg by mouth daily. 60 tablet 2   ??? pravastatin (PRAVACHOL) 40 MG tablet Take 1 tablet (40 mg total) by mouth daily with evening meal. 90 tablet 1   ??? predniSONE (DELTASONE) 10 MG tablet Take 4 tabs (40 mg) for 5 days, then take 3 tabs (30 mg) for 5 days, then take 2 tabs (20 mg) for 5 days, then take 1 tab (10 mg) daily until seen by Dr. Wellington Hampshire 200 tablet 3   ??? sildenafiL, pulm.hypertension, (REVATIO) 20 mg tablet Take 1 tablet (20 mg total) by mouth Three (3) times a day. 90 tablet 11     Current Facility-Administered Medications   Medication Dose Route Frequency Provider Last Rate  Last Admin   ??? sodium chloride 3 % nebulizer solution 4 mL  4 mL Nebulization Once Melanee Left, MD           Health Maintenance:     Health Maintenance Summary w/Most Recent Date       Status Date      HPV Cotest with Pap Smear (21-65) Overdue 06/15/1984     Pap Smear with Cotest HPV (21-65) Overdue 02/27/2001      Done 02/28/1996 Registry Metric: Pap Smear date     Done 02/28/1996 PAP SMEAR (HISTORICAL RESULT)    FIT-DNA Stool Test Overdue 06/15/2013     Zoster Vaccines Overdue 06/15/2013     DTaP/Tdap/Td Vaccines Overdue 07/07/2017      Done 07/08/2007 Imm Admin: TdaP    Hemoglobin A1c Next Due 08/29/2019      Done 05/29/2019 Registry Metric: Last Hemoglobin A1c Date     Done 05/29/2019 POCT GLYCOSYLATED HEMOGLOBIN (HGB A1C) HGB A1C, RAP/PDS           Done 11/01/2018 HEMOGLOBIN A1C Hemoglobin A1C           Done 03/05/2018 HEMOGLOBIN A1C Hemoglobin A1C           Done 11/05/2017 HEMOGLOBIN A1C Hemoglobin A1C           Patient has more history with this topic...    Foot Exam Next Due 11/01/2019      Done 11/01/2018 [facility] DIABETES FOOT EXAM     Done 11/05/2017 HM DIABETES FOOT EXAM     Done 06/14/2016 HM DIABETES FOOT EXAM     Done 11/05/2012 HM DIABETES FOOT EXAM    Urine Albumin/Creatinine Ratio Next Due 11/01/2019      Done 11/01/2018 Registry Metric: [facility] DM LAST ALBQTUR     Done 11/01/2018 ALBUMIN / CREATININE URINE RATIO Albumin/Creatinine Ratio           Done 11/05/2017 ALBUMIN / CREATININE URINE RATIO Albumin/Creatinine Ratio           Done 06/14/2016 ALBUMIN / CREATININE URINE RATIO Albumin/Creatinine Ratio          Retinal Eye Exam Next Due 11/15/2019      Done 11/15/2018 HM DIABETES EYE EXAM HM Diabetic Eye Exam           Done 04/12/2016 HM DIABETES EYE EXAM HM Diabetic Eye Exam           Done 06/18/2014 SmartData: OPHTH FUNDUS OD PERIPHERY     Done 06/18/2014 SmartData: OPHTH FUNDUS OS PERIPHERY     Done 06/18/2014 SmartData: FINDINGS - PE - EYES - FUNDUSCOPIC - PERIPHERY - LEFT PERIPHERY NORMAL    Mammogram Start Age 41 Next Due 02/29/2020      Done 02/28/2018 MAMMO SCREENING BILATERAL     Done 03/16/2015 MAMMO SCREENING BILATERAL    Serum Creatinine Monitoring Next Due 05/29/2020      Done 05/30/2019 Registry Metric: Serum creatinine     Done 08/02/2016 Ext Proc: CHG BASIC METABOLIC PANEL CALCIUM TOTAL     Done 05/30/2019 COMPREHENSIVE METABOLIC PANEL Creatinine           Done 04/29/2019 BASIC METABOLIC PANEL Creatinine           Done 08/28/2018 COMPREHENSIVE METABOLIC PANEL Creatinine           Patient has more history with this topic...    Potassium Monitoring Next Due 05/29/2020      Done 05/30/2019 Registry Metric: Potassium     Done 08/02/2016 Ext  Proc: CHG BASIC METABOLIC PANEL CALCIUM TOTAL     Done 05/30/2019 COMPREHENSIVE METABOLIC PANEL Potassium Done 03/25/1094 BASIC METABOLIC PANEL Potassium           Done 08/28/2018 COMPREHENSIVE METABOLIC PANEL Potassium           Patient has more history with this topic...    COPD Spirometry Next Due 01/04/2022      Done 01/04/2017 FLOW VOLUME LOOP     Done 12/16/2015 SPIROMETRY PRE / POST     Done 11/20/2014 FLOW VOLUME LOOP     Done 06/25/2013 SPIROMETRY    Pneumococcal Vaccine This plan is no longer active.      Done 01/03/2017 Imm Admin: PNEUMOCOCCAL POLYSACCHARIDE 23     Done 07/08/2007 Imm Admin: PNEUMOCOCCAL POLYSACCHARIDE 23    Hepatitis C Screen This plan is no longer active.      Done 01/08/2019 HEPATITIS C RNA, QUANTITATIVE, PCR     Done 11/24/2014 HEPATITIS C ANTIBODY Hepatitis C Ab           Done 11/18/2014 HEPATITIS C RNA, QUANTITATIVE, PCR     Done 11/17/2014 HEPATITIS C ANTIBODY Hepatitis C Ab           Done 11/02/2014 HEPATITIS C RNA, QUANTITATIVE, PCR     Patient has more history with this topic...    Influenza Vaccine This plan is no longer active.      Done 01/08/2019 Imm Admin: Influenza Vaccine Quad (IIV4 PF) 31mo+ injectable     Done 12/06/2017 Imm Admin: Influenza Vaccine Quad (IIV4 PF) 16mo+ injectable     Done 01/03/2017 Imm Admin: Influenza Vaccine Quad (IIV4 PF) 30mo+ injectable     Done 12/16/2015 Imm Admin: Influenza Vaccine Quad (IIV4 PF) 72mo+ injectable     Done 12/19/2014 Imm Admin: Influenza Vaccine Quad (IIV4 PF) 62mo+ injectable     Patient has more history with this topic...    COVID-19 Vaccine This plan is no longer active.      Done 07/02/2019 Imm Admin: COVID-19 VACC,MRNA,(PFIZER)(PF)(IM)     Done 06/11/2019 Imm Admin: COVID-19 VACC,MRNA,(PFIZER)(PF)(IM)          Immunizations:     Immunization History   Administered Date(s) Administered   ??? COVID-19 VACC,MRNA,(PFIZER)(PF)(IM) 06/11/2019, 07/02/2019   ??? INFLUENZA TIV (TRI) PF (IM) 01/02/2005, 12/14/2008   ??? Influenza Vaccine Quad (IIV4 PF) 66mo+ injectable 11/28/2012, 12/19/2014, 12/16/2015, 01/03/2017, 12/06/2017, 01/08/2019   ??? PNEUMOCOCCAL POLYSACCHARIDE 23 07/08/2007, 01/03/2017   ??? PPD Test 01/19/2015   ??? TdaP 07/08/2007       I have reviewed and (if needed) updated the patient's problem list, medications, allergies, past medical and surgical history, social and family history.    ROS:      ROS  Comprehensive 10 point ROS negative unless otherwise stated in the HPI.       Vital Signs:     Wt Readings from Last 3 Encounters:   08/22/19 (!) 104.3 kg (230 lb)   08/07/19 100.2 kg (221 lb)   05/30/19 (!) 107.6 kg (237 lb 4.8 oz)     Temp Readings from Last 3 Encounters:   08/22/19 35.9 ??C (96.7 ??F) (Temporal)   05/30/19 36.4 ??C (97.6 ??F)   05/29/19 36.9 ??C (98.4 ??F) (Oral)     BP Readings from Last 3 Encounters:   08/22/19 153/91   05/30/19 119/89   05/29/19 116/74     Pulse Readings from Last 3 Encounters:   08/22/19 111   05/30/19 98   05/29/19 75  Estimated body mass index is 32.08 kg/m?? as calculated from the following:    Height as of 08/22/19: 180.3 cm (5' 11).    Weight as of 08/22/19: 104.3 kg (230 lb).  No height and weight on file for this encounter.        Objective:      General: Alert and oriented x3. Well-appearing. No acute distress. ***  HEENT:  Normocephalic.  Atraumatic. Conjunctiva and sclera normal. OP MMM without lesions. ***  Neck:  Supple. No thyroid enlargement. No adenopathy. ***  Heart:  Regular rate and rhythm . Normal S1, S2.  No murmurs, rubs or gallops. ***  Lungs:  No respiratory distress.  Lungs clear to auscultation. No wheezes, rhonchi, or rales. ***  GI/GU:  Soft, +BS, nondistended, non-TTP. No palpable masses or organomegaly. ***  Extremities:  No edema. Peripheral pulses normal. ***  Skin:  Warm, dry. No rash or lesions present. ***  Neuro:  Non-focal. No obvious weakness. ***  Psych:  Affect normal, eye contact good, speech clear and coherent. ***     I attest that I, Bayard Hugger, personally documented this note while acting as scribe for Noralyn Pick, FNP.      Bayard Hugger, Scribe.  09/08/2019     The documentation recorded by the scribe accurately reflects the service I personally performed and the decisions made by me.    Noralyn Pick, FNP

## 2019-09-09 MED FILL — NUCALA 100 MG/ML SUBCUTANEOUS AUTO-INJECTOR: ORAL | 28 days supply | Qty: 1 | Fill #2

## 2019-09-09 MED FILL — NUCALA 100 MG/ML SUBCUTANEOUS AUTO-INJECTOR: 28 days supply | Qty: 1 | Fill #2 | Status: AC

## 2019-09-09 NOTE — Unmapped (Signed)
Good Morning,      I faxed this to [facility] and verified receipt.    Thanks!

## 2019-09-11 NOTE — Unmapped (Unsigned)
Assessment and Plan:     There are no diagnoses linked to this encounter.     HGB A1c *** (*** from??9.7 three??months ago).   DM well controlled. Continue??insulin aspart with sliding scale dosage (1-20 units)??QID AC and HS,??insulin glargine??18 units daily, and metformin 1000 mg BID.  Encouraged patient to continue carb controlled diet and regular exercise.??  ??  BP at goal (***/*** in clinic today). Continue HCTZ 25 mg daily. Reviewed low sodium diet and encouraged regular exercise. Advised to continue to monitor and log at-home BP readings.    HPI:      Karen Barber  is here for No chief complaint on file.    Diabetes: Patient presents for follow up of diabetes.  A1C goal is <8.  Diabetes has customarily {been/not been:38678} at goal (complicated by: ***).  Current symptoms include: {dm sx:14075}. Symptoms have {symptom progression:19445}. Patient denies {dm sx:19199}. Evaluation to date has included: {dm labs:959 373 0793}.  Home sugars: {dm home sugars:14018}. Current treatment: {km dm tx:73344} which has been {effective/ineffective:14021}.  Doing regular exercise: {yes no:22180}.   ??  Hypertension: Patient presents for follow-up of hypertension. Blood pressure goal < 140/90.  Hypertension has customarily {been/notbeen:38678} at goal complicated by ***.  Home blood pressure readings: {home bp readings:17448}. Salt intake and diet: {salt intake/diet:17449}. Associated signs and symptoms: {symptoms hypertension:17452}. Patient denies: {symptoms hypertension:19611}. Medication compliance: {med compliance:10573}. She {is/is not:23060} doing regular exercise.      {kerichronicdx:74702}    {keriacutedx:74703}       [facility] Components:     Goals     ??? Get out more often and participate in more activities      Reviewed LS 08/07/19    Things to think about to help me reach my goal:     What are you going to do? Getting out more, exercise   How and how much? Treadmill   How frequent? daily   Barriers to success? breathing Solutions to barriers?                Medication adherence and barriers to the treatment plan have been addressed. Opportunities to optimize healthy behaviors have been discussed. Patient / caregiver voiced understanding.      Past Medical/Surgical History:     Past Medical History:   Diagnosis Date   ??? 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 Breast injury     lung surg on left incision under breast sept 2016   ??? Caregiver burden     parents    ??? Hepatitis C antibody test positive 2016    repeatedly negative HCV RNA, indicating clearance of infection w/o treatment   ??? Hyperlipidemia    ??? Hypertension    ??? 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 On home O2 2008    per Dr Mal Amabile note from 02/21/2017   ??? Pulmonary aspergillosis (CMS-HCC) 2016    A.fumigatus in 2016, prior to LUL lobectomy. A.niger from sputum 08/2016-09/2016.   ??? 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 Sarcoidosis 1995   ??? Type 2 diabetes mellitus with hyperglycemia (CMS-HCC) 07/27/2014   ??? Visual impairment     glasses     Past Surgical History:   Procedure Laterality Date   ??? LUNG LOBECTOMY     ??? PR RIGHT HEART CATH O2 SATURATION & CARDIAC OUTPUT N/A 04/29/2019    Procedure:  Right Heart Catheterization;  Surgeon: Rosana Hoes, MD;  Location: [facility] CATH;  Service: Cardiology   ??? PR THORACOSCOPY SURG TOT PULM DECORT Left 12/14/2014    Procedure: THORACOSCOPY SURG; W/TOT PULM DECORTIC/PNEUMOLYS;  Surgeon: Evert Kohl, MD;  Location: MAIN OR [facility];  Service: Cardiothoracic   ??? PR THORACOSCOPY W/THERA WEDGE RESEXN INITIAL UNILAT Left 12/03/2014    Procedure: THORACOSCOPY, SURGICAL; WITH THERAPEUTIC WEDGE RESECTION (EG, MASS, NODULE) INITIAL UNILATERAL;  Surgeon: Evert Kohl, MD;  Location: MAIN OR [facility];  Service: Cardiothoracic       Family History:     Family History   Problem Relation Age of Onset   ??? Diabetes Father    ??? Diabetes Brother    ??? Diabetes Maternal Aunt    ??? Sarcoidosis Maternal Aunt    ??? Diabetes Maternal Uncle    ??? Diabetes Paternal Aunt    ??? Diabetes Paternal Uncle        Social History:     Social History     Socioeconomic History   ??? Marital status: Single     Spouse name: Not on file   ??? Number of children: Not on file   ??? Years of education: Not on file   ??? Highest education level: Not on file   Occupational History   ??? Not on file   Tobacco Use   ??? Smoking status: Former Smoker     Packs/day: 0.50     Years: 9.00     Pack years: 4.50     Types: Cigarettes     Quit date: 05/03/1987     Years since quitting: 32.3   ??? Smokeless tobacco: Never Used   Vaping Use   ??? Vaping Use: Never used   Substance and Sexual Activity   ??? Alcohol use: No     Alcohol/week: 0.0 standard drinks   ??? Drug use: No   ??? Sexual activity: Yes   Other Topics Concern   ??? Do you use sunscreen? No   ??? Tanning bed use? No   ??? Are you easily burned? No   ??? Excessive sun exposure? No   ??? Blistering sunburns? No   Social History Narrative   ??? Not on file     Social Determinants of Health     Financial Resource Strain: Low Risk    ??? Difficulty of Paying Living Expenses: Not hard at all   Food Insecurity: No Food Insecurity   ??? Worried About Programme researcher, broadcasting/film/video in the Last Year: Never true   ??? Ran Out of Food in the Last Year: Never true   Transportation Needs: No Transportation Needs   ??? Lack of Transportation (Medical): No   ??? Lack of Transportation (Non-Medical): No   Physical Activity:    ??? Days of Exercise per Week:    ??? Minutes of Exercise per Session:    Stress:    ??? Feeling of Stress :    Social Connections:    ??? Frequency of Communication with Friends and Family:    ??? Frequency of Social Gatherings with Friends and Family:    ??? Attends Religious Services:    ??? Database administrator or Organizations:    ??? Attends Banker Meetings:    ??? Marital Status:        Allergies:     Patient has no known allergies. Current Medications:     Current Outpatient Medications   Medication Sig Dispense Refill   ???  ACCU-CHEK FASTCLIX LANCET DRUM Misc USE TO CHECK GLUCOSE 4 TIMES DAILY BEFORE MEAL(S) AND NIGHTLY     ??? ACCU-CHEK GUIDE GLUCOSE METER Misc 1 Device by Other route Four (4) times a day (before meals and nightly). AS DIRECTED 1 kit 0   ??? albuterol 2.5 mg /3 mL (0.083 %) nebulizer solution Inhale 3 mL (2.5 mg total) by nebulization every six (6) hours as needed for wheezing or shortness of breath (airway clearance/cough). 120 mL 0   ??? albuterol HFA 90 mcg/actuation inhaler Inhale 2 puffs every four (4) hours as needed for wheezing or shortness of breath. 8.5 g 3   ??? blood sugar diagnostic Strp by Other route Four (4) times a day (before meals and nightly). 400 strip 1   ??? budesonide-formoteroL (SYMBICORT) 160-4.5 mcg/actuation inhaler Inhale 2 puffs Two (2) times a day. 20 g 4   ??? cyclobenzaprine (FLEXERIL) 10 MG tablet Take 1 tablet (10 mg total) by mouth Three (3) times a day as needed. 90 tablet 0   ??? empty container Misc USE AS DIRECTED 1 each 2   ??? EPINEPHrine (EPIPEN 2-PAK) 0.3 mg/0.3 mL injection Inject 0.3 mL (0.3 mg total) into the muscle once for 1 dose. (Patient not taking: Reported on 08/07/2019) 1 Device 1   ??? gabapentin (NEURONTIN) 300 MG capsule Take 1 capsule (300 mg total) by mouth Three (3) times a day. 270 capsule 1   ??? hydroCHLOROthiazide (HYDRODIURIL) 25 MG tablet Take 1 tablet (25 mg total) by mouth daily. TAKE ONE (1) TABLET BY MOUTH EVERY DAY 90 tablet 1   ??? insulin ASPART (NOVOLOG U-100 INSULIN ASPART) 100 unit/mL injection Take 1-20 units SQ QID AC and HS as directed 80 mL 1   ??? insulin glargine (BASAGLAR, LANTUS) 100 unit/mL (3 mL) injection pen 18 units 30 pen 3   ??? ipratropium (ATROVENT) 0.02 % nebulizer solution Inhale 2.5 mL (500 mcg total) by nebulization Four (4) times a day. 300 mL 3   ??? ketoconazole (NIZORAL) 2 % cream Apply 1 application topically Two (2) times a day. Use for 2-3 weeks. 30 g 11   ??? MEDICAL SUPPLY ITEM Accucheck Glide Glocumeter for home glucose checks. Check blood glucose ACHS. 1 Package 0   ??? mepolizumab (NUCALA) 100 mg/mL AtIn Inject the contents of 1 syringe (100 mg) under the skin every twenty-eight (28) days. 1 mL 12   ??? metFORMIN (GLUCOPHAGE) 1000 MG tablet Take 1 tablet (1,000 mg total) by mouth 2 (two) times a day with meals. 180 tablet 1   ??? miscellaneous medical supply Misc Nebulizer machine and tubing. Patients machine is 56 years old. 1 each 0   ??? OXYGEN-AIR DELIVERY SYSTEMS MISC Dose: 2-3 LPM     ??? posaconazole (NOXAFIL) 100 mg TbEC delayed released tablet Take 200 mg by mouth daily. 60 tablet 2   ??? pravastatin (PRAVACHOL) 40 MG tablet Take 1 tablet (40 mg total) by mouth daily with evening meal. 90 tablet 1   ??? predniSONE (DELTASONE) 10 MG tablet Take 4 tabs (40 mg) for 5 days, then take 3 tabs (30 mg) for 5 days, then take 2 tabs (20 mg) for 5 days, then take 1 tab (10 mg) daily until seen by Dr. Wellington Hampshire 200 tablet 3   ??? sildenafiL, pulm.hypertension, (REVATIO) 20 mg tablet Take 1 tablet (20 mg total) by mouth Three (3) times a day. 90 tablet 11     Current Facility-Administered Medications   Medication Dose Route Frequency Provider Last Rate  Last Admin   ??? sodium chloride 3 % nebulizer solution 4 mL  4 mL Nebulization Once Melanee Left, MD           Health Maintenance:     Health Maintenance Summary w/Most Recent Date       Status Date      HPV Cotest with Pap Smear (21-65) Overdue 06/15/1984     Pap Smear with Cotest HPV (21-65) Overdue 02/27/2001      Done 02/28/1996 Registry Metric: Pap Smear date     Done 02/28/1996 PAP SMEAR (HISTORICAL RESULT)    FIT-DNA Stool Test Overdue 06/15/2013     Zoster Vaccines Overdue 06/15/2013     DTaP/Tdap/Td Vaccines Overdue 07/07/2017      Done 07/08/2007 Imm Admin: TdaP    Hemoglobin A1c Next Due 08/29/2019      Done 05/29/2019 Registry Metric: Last Hemoglobin A1c Date     Done 05/29/2019 POCT GLYCOSYLATED HEMOGLOBIN (HGB A1C) HGB A1C, RAP/PDS           Done 11/01/2018 HEMOGLOBIN A1C Hemoglobin A1c           Done 03/05/2018 HEMOGLOBIN A1C Hemoglobin A1c           Done 11/05/2017 HEMOGLOBIN A1C Hemoglobin A1c           Patient has more history with this topic...    Foot Exam Next Due 11/01/2019      Done 11/01/2018 HM DIABETES FOOT EXAM     Done 11/05/2017 HM DIABETES FOOT EXAM     Done 06/14/2016 HM DIABETES FOOT EXAM     Done 11/05/2012 HM DIABETES FOOT EXAM    Urine Albumin/Creatinine Ratio Next Due 11/01/2019      Done 11/01/2018 Registry Metric: [facility] DM LAST ALBQTUR     Done 11/01/2018 ALBUMIN / CREATININE URINE RATIO Albumin/Creatinine Ratio           Done 11/05/2017 ALBUMIN / CREATININE URINE RATIO Albumin/Creatinine Ratio           Done 06/14/2016 ALBUMIN / CREATININE URINE RATIO Albumin/Creatinine Ratio          Retinal Eye Exam Next Due 11/15/2019      Done 11/15/2018 HM DIABETES EYE EXAM HM Diabetic Eye Exam           Done 04/12/2016 HM DIABETES EYE EXAM HM Diabetic Eye Exam           Done 06/18/2014 SmartData: OPHTH FUNDUS OD PERIPHERY     Done 06/18/2014 SmartData: OPHTH FUNDUS OS PERIPHERY     Done 06/18/2014 SmartData: FINDINGS - PE - EYES - FUNDUSCOPIC - PERIPHERY - LEFT PERIPHERY NORMAL    Mammogram Start Age 46 Next Due 02/29/2020      Done 02/28/2018 MAMMO SCREENING BILATERAL     Done 03/16/2015 MAMMO SCREENING BILATERAL    Serum Creatinine Monitoring Next Due 05/29/2020      Done 05/30/2019 Registry Metric: Serum creatinine     Done 08/02/2016 Ext Proc: CHG BASIC METABOLIC PANEL CALCIUM TOTAL     Done 05/30/2019 COMPREHENSIVE METABOLIC PANEL Creatinine           Done 04/29/2019 BASIC METABOLIC PANEL Creatinine           Done 08/28/2018 COMPREHENSIVE METABOLIC PANEL Creatinine           Patient has more history with this topic...    Potassium Monitoring Next Due 05/29/2020      Done 05/30/2019 Registry Metric: Potassium     Done 08/02/2016 Ext  Proc: CHG BASIC METABOLIC PANEL CALCIUM TOTAL     Done 05/30/2019 COMPREHENSIVE METABOLIC PANEL Potassium Done 04/27/4130 BASIC METABOLIC PANEL Potassium           Done 08/28/2018 COMPREHENSIVE METABOLIC PANEL Potassium           Patient has more history with this topic...    COPD Spirometry Next Due 01/04/2022      Done 01/04/2017 FLOW VOLUME LOOP     Done 12/16/2015 SPIROMETRY PRE / POST     Done 11/20/2014 FLOW VOLUME LOOP     Done 06/25/2013 SPIROMETRY    Pneumococcal Vaccine This plan is no longer active.      Done 01/03/2017 Imm Admin: PNEUMOCOCCAL POLYSACCHARIDE 23     Done 07/08/2007 Imm Admin: PNEUMOCOCCAL POLYSACCHARIDE 23    Hepatitis C Screen This plan is no longer active.      Done 01/08/2019 HEPATITIS C RNA, QUANTITATIVE, PCR     Done 11/24/2014 HEPATITIS C ANTIBODY Hepatitis C Ab           Done 11/18/2014 HEPATITIS C RNA, QUANTITATIVE, PCR     Done 11/17/2014 HEPATITIS C ANTIBODY Hepatitis C Ab           Done 11/02/2014 HEPATITIS C RNA, QUANTITATIVE, PCR     Patient has more history with this topic...    Influenza Vaccine This plan is no longer active.      Done 01/08/2019 Imm Admin: Influenza Vaccine Quad (IIV4 PF) 59mo+ injectable     Done 12/06/2017 Imm Admin: Influenza Vaccine Quad (IIV4 PF) 11mo+ injectable     Done 01/03/2017 Imm Admin: Influenza Vaccine Quad (IIV4 PF) 67mo+ injectable     Done 12/16/2015 Imm Admin: Influenza Vaccine Quad (IIV4 PF) 69mo+ injectable     Done 12/19/2014 Imm Admin: Influenza Vaccine Quad (IIV4 PF) 72mo+ injectable     Patient has more history with this topic...    COVID-19 Vaccine This plan is no longer active.      Done 07/02/2019 Imm Admin: COVID-19 VACC,MRNA,(PFIZER)(PF)(IM)     Done 06/11/2019 Imm Admin: COVID-19 VACC,MRNA,(PFIZER)(PF)(IM)          Immunizations:     Immunization History   Administered Date(s) Administered   ??? COVID-19 VACC,MRNA,(PFIZER)(PF)(IM) 06/11/2019, 07/02/2019   ??? INFLUENZA TIV (TRI) PF (IM) 01/02/2005, 12/14/2008   ??? Influenza Vaccine Quad (IIV4 PF) 29mo+ injectable 11/28/2012, 12/19/2014, 12/16/2015, 01/03/2017, 12/06/2017, 01/08/2019   ??? PNEUMOCOCCAL POLYSACCHARIDE 23 07/08/2007, 01/03/2017   ??? PPD Test 01/19/2015   ??? TdaP 07/08/2007       I have reviewed and (if needed) updated the patient's problem list, medications, allergies, past medical and surgical history, social and family history.    ROS:      ROS  Comprehensive 10 point ROS negative unless otherwise stated in the HPI.       Vital Signs:     Wt Readings from Last 3 Encounters:   08/22/19 (!) 104.3 kg (230 lb)   08/07/19 100.2 kg (221 lb)   05/30/19 (!) 107.6 kg (237 lb 4.8 oz)     Temp Readings from Last 3 Encounters:   08/22/19 35.9 ??C (96.7 ??F) (Temporal)   05/30/19 36.4 ??C (97.6 ??F)   05/29/19 36.9 ??C (98.4 ??F) (Oral)     BP Readings from Last 3 Encounters:   08/22/19 153/91   05/30/19 119/89   05/29/19 116/74     Pulse Readings from Last 3 Encounters:   08/22/19 111   05/30/19 98   05/29/19 75  Estimated body mass index is 32.08 kg/m?? as calculated from the following:    Height as of 08/22/19: 180.3 cm (5' 11).    Weight as of 08/22/19: 104.3 kg (230 lb).  No height and weight on file for this encounter.        Objective:      General: Alert and oriented x3. Well-appearing. No acute distress. ***  HEENT:  Normocephalic.  Atraumatic. Conjunctiva and sclera normal. OP MMM without lesions. ***  Neck:  Supple. No thyroid enlargement. No adenopathy. ***  Heart:  Regular rate and rhythm . Normal S1, S2.  No murmurs, rubs or gallops. ***  Lungs:  No respiratory distress.  Lungs clear to auscultation. No wheezes, rhonchi, or rales. ***  GI/GU:  Soft, +BS, nondistended, non-TTP. No palpable masses or organomegaly. ***  Extremities:  No edema. Peripheral pulses normal. ***  Skin:  Warm, dry. No rash or lesions present. ***  Neuro:  Non-focal. No obvious weakness. ***  Psych:  Affect normal, eye contact good, speech clear and coherent. ***     I attest that I, Bayard Hugger, personally documented this note while acting as scribe for Noralyn Pick, FNP.      Bayard Hugger, Scribe.  09/12/2019     The documentation recorded by the scribe accurately reflects the service I personally performed and the decisions made by me.    Noralyn Pick, FNP

## 2019-09-12 ENCOUNTER — Ambulatory Visit: Admit: 2019-09-12 | Payer: MEDICARE | Attending: Family | Primary: Family

## 2019-09-29 NOTE — Unmapped (Unsigned)
Nutrition Consult Note    Referring Provider:  Loran Senters, *    Reason for Referral:  No chief complaint on file.    Medical History:  Patient Active Problem List   Diagnosis   ??? Pulmonary sarcoidosis (CMS-HCC)   ??? HTN (hypertension)   ??? Aspergillus fumigatus (CMS-HCC)   ??? Type 2 diabetes mellitus with hyperglycemia (CMS-HCC)   ??? Bronchiectasis type 1 (CMS-HCC)   ??? Cavitary lesion of lung   ??? Invasive pulmonary aspergillosis (CMS-HCC)   ??? COPD (chronic obstructive pulmonary disease) (CMS-HCC)   ??? Dyspnea on exertion   ??? Hypoxia   ??? Chronic pulmonary aspergillosis (CMS-HCC)   ??? Eosinophilic asthma   ??? 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 Eosinophilia   ??? DOE (dyspnea on exertion)       Barriers to Care:  {BARRIERS TO ZOXW:96045}    Present height    Present weight    Current BMI      Weight Status Categories:  Underweight:  BMI < 18.5  Normal Weight:  BMI 18.5 - 24.9  Overweight:  BMI 25 - 29.9  Obesity Class I:  BMI 30 - 34.9  Obesity Class II:  BMI 35 - 39.9  Obesity Class III:  BMI ? 40    Waist Circumference (for BMI >25 and <35 without diabetes):   09/29/2019 *** in    Weight History:  Wt Readings from Last 6 Encounters:   08/22/19 (!) 104.3 kg (230 lb)   08/07/19 100.2 kg (221 lb)   05/30/19 (!) 107.6 kg (237 lb 4.8 oz)   05/29/19 (!) 109.8 kg (242 lb)   05/13/19 (!) 102.1 kg (225 lb)   04/29/19 (!) 107 kg (236 lb)       Allergies:   No Known Allergies    Relevant Medications, Herbs, Supplements:    Current Outpatient Medications:   ???  ACCU-CHEK FASTCLIX LANCET DRUM Misc, USE TO CHECK GLUCOSE 4 TIMES DAILY BEFORE MEAL(S) AND NIGHTLY, Disp: , Rfl:   ???  ACCU-CHEK GUIDE GLUCOSE METER Misc, 1 Device by Other route Four (4) times a day (before meals and nightly). AS DIRECTED, Disp: 1 kit, Rfl: 0  ???  albuterol 2.5 mg /3 mL (0.083 %) nebulizer solution, Inhale 3 mL (2.5 mg total) by nebulization every six (6) hours as needed for wheezing or shortness of breath (airway clearance/cough)., Disp: 120 mL, Rfl: 0  ???  albuterol HFA 90 mcg/actuation inhaler, Inhale 2 puffs every four (4) hours as needed for wheezing or shortness of breath., Disp: 8.5 g, Rfl: 3  ???  blood sugar diagnostic Strp, by Other route Four (4) times a day (before meals and nightly)., Disp: 400 strip, Rfl: 1  ???  budesonide-formoteroL (SYMBICORT) 160-4.5 mcg/actuation inhaler, Inhale 2 puffs Two (2) times a day., Disp: 20 g, Rfl: 4  ???  cyclobenzaprine (FLEXERIL) 10 MG tablet, Take 1 tablet (10 mg total) by mouth Three (3) times a day as needed., Disp: 90 tablet, Rfl: 0  ???  empty container Misc, USE AS DIRECTED, Disp: 1 each, Rfl: 2  ???  EPINEPHrine (EPIPEN 2-PAK) 0.3 mg/0.3 mL injection, Inject 0.3 mL (0.3 mg total) into the muscle once for 1 dose. (Patient not taking: Reported on 08/07/2019), Disp: 1 Device, Rfl: 1  ???  gabapentin (NEURONTIN) 300 MG capsule, Take 1 capsule (300 mg total) by mouth Three (3) times a day., Disp: 270 capsule, Rfl: 1  ???  hydroCHLOROthiazide (HYDRODIURIL) 25 MG tablet, Take  1 tablet (25 mg total) by mouth daily. TAKE ONE (1) TABLET BY MOUTH EVERY DAY, Disp: 90 tablet, Rfl: 1  ???  insulin ASPART (NOVOLOG U-100 INSULIN ASPART) 100 unit/mL injection, Take 1-20 units SQ QID AC and HS as directed, Disp: 80 mL, Rfl: 1  ???  insulin glargine (BASAGLAR, LANTUS) 100 unit/mL (3 mL) injection pen, 18 units, Disp: 30 pen, Rfl: 3  ???  ipratropium (ATROVENT) 0.02 % nebulizer solution, Inhale 2.5 mL (500 mcg total) by nebulization Four (4) times a day., Disp: 300 mL, Rfl: 3  ???  ketoconazole (NIZORAL) 2 % cream, Apply 1 application topically Two (2) times a day. Use for 2-3 weeks., Disp: 30 g, Rfl: 11  ???  MEDICAL SUPPLY ITEM, Accucheck Glide Glocumeter for home glucose checks. Check blood glucose ACHS., Disp: 1 Package, Rfl: 0  ???  mepolizumab (NUCALA) 100 mg/mL AtIn, Inject the contents of 1 syringe (100 mg) under the skin every twenty-eight (28) days., Disp: 1 mL, Rfl: 12  ???  metFORMIN (GLUCOPHAGE) 1000 MG tablet, Take 1 tablet (1,000 mg total) by mouth 2 (two) times a day with meals., Disp: 180 tablet, Rfl: 1  ???  miscellaneous medical supply Misc, Nebulizer machine and tubing. Patients machine is 56 years old., Disp: 1 each, Rfl: 0  ???  OXYGEN-AIR DELIVERY SYSTEMS MISC, Dose: 2-3 LPM, Disp: , Rfl:   ???  posaconazole (NOXAFIL) 100 mg TbEC delayed released tablet, Take 200 mg by mouth daily., Disp: 60 tablet, Rfl: 2  ???  pravastatin (PRAVACHOL) 40 MG tablet, Take 1 tablet (40 mg total) by mouth daily with evening meal., Disp: 90 tablet, Rfl: 1  ???  predniSONE (DELTASONE) 10 MG tablet, Take 4 tabs (40 mg) for 5 days, then take 3 tabs (30 mg) for 5 days, then take 2 tabs (20 mg) for 5 days, then take 1 tab (10 mg) daily until seen by Dr. Wellington Hampshire, Disp: 200 tablet, Rfl: 3  ???  sildenafiL, pulm.hypertension, (REVATIO) 20 mg tablet, Take 1 tablet (20 mg total) by mouth Three (3) times a day., Disp: 90 tablet, Rfl: 11    Current Facility-Administered Medications:   ???  sodium chloride 3 % nebulizer solution 4 mL, 4 mL, Nebulization, Once, Melanee Left, MD    Do you have any concerns about taking or affording your medications?  ***    Relevant Labs:  Hemoglobin A1C (%)   Date Value   05/29/2019 9.7 (A)   11/01/2018 9.8 (H)   03/05/2018 8.2 (H)   11/05/2017 7.8 (H)   07/03/2017 7.6 (A)   06/20/2014 10.0 (H)   11/05/2012 7.5 (H)      No components found for: LDLCALC   BP Readings from Last 3 Encounters:   08/22/19 153/91   05/30/19 119/89   05/29/19 116/74     Lab Results   Component Value Date    CHOL 150 11/05/2012     Lab Results   Component Value Date    HDL 42 11/05/2012     No results found for: LDL  No results found for: VLDL  No results found for: CHOLHDLRATIO  No results found for: TRIG    Lab Results   Component Value Date    VITDTOTAL 12.9 (L) 05/30/2019     No results found for: VITAMINB12    Blood Glucose Readings   Fasting Lunch Pre-Supper Bedtime   Range (mg/dL) ***      Average (mg/dL)       Meter/Log book present: {  yes:28281}  Hypoglycemia: {Yes/No:22953}    Physical Activity:  Type: {misc; exercise types:16438}   Frequency: *** days per week  Duration: *** minutes per day    24-Hour recall/usual intake:    Time Intake   Breakfast ***   Snack (AM) ***   Lunch ***   Snack (PM) ***   Dinner ***   Snack (HS) ***     Other Nutrition Information:  ***    Estimated Daily Needs:  {Energy Needs:57594}  *** g protein  {Nutrient Needs:58116:p}    Nutrition Diagnosis:  {Nutrition diagnosis:304200134}    Nutrition Intervention:  {Nutrition Interventions:3047301}    Materials Provided:  {Materials provided ZOXW:960454098}    Social Determinants of Health screened today. Interventions provided: {ECMSDOHDROP:72168}    Patient Stated Nutrition Goals:  ***  Goals Added to Visit Navigator?  {goals yes/no:57886}    Monitoring/Evaluation:  Progress towards goals:  {progress toward goals:75879}    Follow-up:  ***    Length of visit was *** minutes  *** unit(s) billed per insurance    Hassan Rowan, RD LDN

## 2019-10-03 NOTE — Unmapped (Signed)
[facility] Refill Coordination Note    Specialty Medication(s) to be Shipped:   CF/Pulmonary: -Nucala 100mg /ml     Karen Barber, DOB: [birth year]  Phone: [phone] (home) [phone] (work)    All above HIPAA information was verified with patient.     Was a Nurse, learning disability used for this call? No    Completed refill call assessment today to schedule patient's medication shipment from the [facility] [phone]).       Specialty medication(s) and dose(s) confirmed: Regimen is correct and unchanged.   Changes to medications: Karen Barber reports no changes at this time.  Changes to insurance: No  Questions for the pharmacist: No    Confirmed patient received Welcome Packet with first shipment. The patient will receive a drug information handout for each medication shipped and additional FDA Medication Guides as required.       DISEASE/MEDICATION-SPECIFIC INFORMATION        For CF patients: CF [facility] Active? No-not enrolled    SPECIALTY MEDICATION ADHERENCE     Medication Adherence    Patient reported X missed doses in the last month: 0  Specialty Medication: Nucala 100mg /ml  Patient is on additional specialty medications: No  Informant: patient  Reliability of informant: reliable        Nucala 100mg /ml : 0 days of medicine on hand     SHIPPING     Shipping address confirmed in Epic.     Delivery Scheduled: Yes, Expected medication delivery date: 10/08/2019.     Medication will be delivered via Same Day Courier to the prescription address in Epic WAM.    Karen Barber P Allena Katz   [facility] Specialty Technician

## 2019-10-08 MED FILL — NUCALA 100 MG/ML SUBCUTANEOUS AUTO-INJECTOR: 28 days supply | Qty: 1 | Fill #3 | Status: AC

## 2019-10-08 MED FILL — NUCALA 100 MG/ML SUBCUTANEOUS AUTO-INJECTOR: ORAL | 28 days supply | Qty: 1 | Fill #3

## 2019-10-13 DIAGNOSIS — J418 Mixed simple and mucopurulent chronic bronchitis: Principal | ICD-10-CM

## 2019-10-13 DIAGNOSIS — B441 Other pulmonary aspergillosis: Principal | ICD-10-CM

## 2019-10-13 DIAGNOSIS — D860 Sarcoidosis of lung: Principal | ICD-10-CM

## 2019-10-13 DIAGNOSIS — D869 Sarcoidosis, unspecified: Principal | ICD-10-CM

## 2019-10-13 MED ORDER — INSULIN ASPART U-100  100 UNIT/ML SUBCUTANEOUS SOLUTION
1 refills | 0 days | Status: CP
Start: 2019-10-13 — End: 2020-10-12

## 2019-10-13 MED ORDER — PREDNISONE 10 MG TABLET
ORAL_TABLET | Freq: Every day | ORAL | 2 refills | 30 days | Status: CP
Start: 2019-10-13 — End: ?

## 2019-10-13 MED ORDER — POSACONAZOLE 100 MG TABLET,DELAYED RELEASE
ORAL_TABLET | Freq: Every day | ORAL | 2 refills | 30.00000 days | Status: CP
Start: 2019-10-13 — End: ?

## 2019-10-13 MED ORDER — ALBUTEROL SULFATE HFA 90 MCG/ACTUATION AEROSOL INHALER
RESPIRATORY_TRACT | 3 refills | 0 days | Status: CP | PRN
Start: 2019-10-13 — End: 2020-10-12

## 2019-10-13 MED ORDER — BUDESONIDE-FORMOTEROL HFA 160 MCG-4.5 MCG/ACTUATION AEROSOL INHALER
Freq: Two times a day (BID) | RESPIRATORY_TRACT | 3 refills | 15.00000 days | Status: CP
Start: 2019-10-13 — End: 2020-01-11

## 2019-10-13 NOTE — Unmapped (Signed)
Pharmacy requesting refill    Last visit: 05/13/2019 (Dr. Wellington Hampshire)  Next visit: not scheduled    RX routed to Dr. Tresa Res to review (Dr. 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  We will work to obtain portable oxygen concentrator versus portable noninvasive oxygen supplementation and positive airway pressure.  -- Annual spirometry, DLCO   -- Annual ophthalmology and EKG (if abnormal EKG or cardiac symptoms, will proceed to cardiac MRI)  -- She has been referred for evaluation of pulmonary transplant in the past however is not a candidate due to prior PA plasty which precludes her ability to undergo the surgery for lung transplantation her thoracic surgery  -- Continue Life 2000 home ventilator.  -- Will obtain CPET     Requested Prescriptions     Pending Prescriptions Disp Refills   ??? predniSONE (DELTASONE) 10 MG tablet 30 tablet 2     Sig: Take 1 tablet (10 mg total) by mouth daily. Take 1 tablet daily (10 mg) daily until seen by Dr. Wellington Hampshire

## 2019-10-13 NOTE — Unmapped (Signed)
Pharmacy requesting refill    Last visit: 05/13/2019 (Dr. Wellington Hampshire)  Next visit: not scheduled    RX routed to Dr. Tresa Res for review (Dr. Wellington Hampshire not available)    INTERVAL HPI 05/13/19  Since last visit, patient feels stable from breathing standpoint.  She was having episodes of desaturation with exertion.  She started taking Augmentin on her own and noted significant improvement in her oxygen saturation.  In addition, she was able to walk more than her usual.  ??  Denies orthopnea, lower extremity swelling, dizziness lightheadedness or syncope.  She remains on 10 mg prednisone daily, uses Symbicort, and posaconazole for invasive pulmonary aspergillosis.  She is also adherent to airway clearance with albuterol and hypertonic saline saline 7% but does not use the Acapella.  Currently on supplemental oxygen 5 L nasal cannula.  ??  She recently underwent a right heart catheterization that revealed pulmonary hypertension with mean PAP of 33, PAWP 12, borderline cardiac output, and PVR of 4.3 Wood units.      Requested Prescriptions     Pending Prescriptions Disp Refills   ??? budesonide-formoteroL (SYMBICORT) 160-4.5 mcg/actuation inhaler 10.2 g 3     Sig: Inhale 2 puffs Two (2) times a day.   ??? albuterol HFA 90 mcg/actuation inhaler 8.5 g 3     Sig: Inhale 2 puffs every four (4) hours as needed for wheezing or shortness of breath.

## 2019-10-13 NOTE — Unmapped (Signed)
Refill novolog

## 2019-10-20 MED ORDER — BLOOD SUGAR DIAGNOSTIC STRIPS
ORAL_STRIP | Freq: Four times a day (QID) | 1 refills | 0.00000 days
Start: 2019-10-20 — End: 2020-10-20

## 2019-10-21 NOTE — Unmapped (Signed)
Pharmacy calling. Patient's insurance will no longer cover the blood glucose test strips for four times a day. They will cover three times a day. Please advise.

## 2019-10-23 NOTE — Unmapped (Signed)
[place] Assessment of Medications Program (CAMP)                        RECRUITMENT SUMMARY NOTE       Patient was identified for CAMP Services.   A letter has been sent explaining program services.        Tenna Delaine, CPhT  Clinical Operations Specialist  [place] [facility] of Medications Program (CAMP)  p [phone]  -  f [phone]

## 2019-10-27 MED ORDER — BLOOD SUGAR DIAGNOSTIC STRIPS
ORAL_STRIP | Freq: Three times a day (TID) | 2 refills | 0.00000 days | Status: CP
Start: 2019-10-27 — End: 2020-10-27

## 2019-11-01 DIAGNOSIS — B353 Tinea pedis: Principal | ICD-10-CM

## 2019-11-01 DIAGNOSIS — D860 Sarcoidosis of lung: Principal | ICD-10-CM

## 2019-11-01 MED ORDER — KETOCONAZOLE 2 % TOPICAL CREAM
Freq: Two times a day (BID) | TOPICAL | 11 refills | 30 days
Start: 2019-11-01 — End: 2020-10-31

## 2019-11-01 MED ORDER — PREDNISONE 10 MG TABLET
ORAL_TABLET | Freq: Every day | ORAL | 2 refills | 30 days
Start: 2019-11-01 — End: ?

## 2019-11-02 MED ORDER — KETOCONAZOLE 2 % TOPICAL CREAM
Freq: Two times a day (BID) | TOPICAL | 11 refills | 30 days
Start: 2019-11-02 — End: 2020-11-01

## 2019-11-03 MED ORDER — PREDNISONE 10 MG TABLET
ORAL_TABLET | Freq: Every day | ORAL | 0 refills | 30 days | Status: CP
Start: 2019-11-03 — End: ?

## 2019-11-04 NOTE — Unmapped (Signed)
[place] Assessment of Medications Program (CAMP)                        RECRUITMENT SUMMARY NOTE       Patient was outreached for CAMP Services. Patient scheduled.     Video Visit Scheduling Checklist:   COS has verified the following with the patient/patient representative:   [x]  Verify access to device with camera (smartphone/tablet or computer with webcam)   []  MyChart > Verify patient/patient representative is able access MyChart  (add MyChart in appt note)   []  Instructions for MyChart video visits sent  [x]  No MyChart access ???  patient will receive link from PharmD at time of appointment   [x]  Link > Verify e-mail address AND/OR cell phone number with patient    [x]  Patient wants link sent to cell phone (add text link in appt note)   []  Patient wants link sent to e-mail  (add e-mail link in appt note)  Troubleshooting (MyChart access):  []  Provide patient with [facility] HealthLink contact information for additional support- [phone]    Camp Scheduled      Med Pre Visit Patient Level Data    PCP confirmed on care team?: Yes  How many pharmacies do you obtain medications from?: 1  What type of pharmacies do you use?: Retail Pharmacy  Who helps you with your medications?: Manage Myself  Do you have issues affording the cost of any of your medications?: No  DO you have fills for 90 day supplies?: Yes (Comment: some 30 d/s)  Are there any other things you hope to discuss with your pharmacist during your visit?: No  Additional comments for Pre Visit Planning: Inhalers: Symbicort, albuterol         Med Baseline Patient Level Data    Do you have any allergies to medications?: No  What tools do you use to help you manage your medications?: Pill box  How often do you forget to take your medications?: Rarely  In the past week, how many days have you missed a dose of medication?: 1         Tenna Delaine, CPhT  Clinical Operations Specialist  [place] Assessment of Medications Program (CAMP)  p [phone]  - f [phone]

## 2019-11-05 NOTE — Unmapped (Signed)
[place] Assessment of Medications Program (CAMP)                    RECRUITMENT SUMMARY NOTE     Patient was outreached to remind about upcoming CAMP appointment. Patient confirmed appointment.    Video Visit Reminder Checklist:   COS has verified the following with the patient/patient representative:   [x]  Verify access to device with camera (smartphone/tablet or computer with webcam)   []  Verify patient/patient representative is able access MyChart (documented in appt notes)  [x]  Verify patient/patient representative is able access link by text/email (documented appt notes)   [x]  Verify e-mail address AND/OR cell phone number with patient  [x]  Verify name of each inhaler with patient (albuterol hfa, symbicort)  [x]  Remind patient to have inhalers on hand at time of appointment  Select the following:   [] MyChart    []   Computer: Confirm Chrome is Therapist, art (send patient visit instructions for PC, if needed)   []   Phone/tablet: confirm MyChart app is downloaded on mobile device (send patient visit instructions for mobile again, if needed)  [x]  Link  [x]  Phone/tablet: Confirm Chrome is Therapist, art and patient is able to receive texts and has camera on phone  []  Computer: Confirm Chrome is Therapist, art and Ensure patient is able to access e-mail and has a Sport and exercise psychologist (MyChart access):  []  Provide patient with [facility] HealthLink contact information for additional support- [phone]     Reshard Guillet [place]  Clinical Operations Specialist/CPhT  [employer] of [facility] (CAMP)  (P) [phone]

## 2019-11-06 NOTE — Unmapped (Signed)
[place] Assessment of Medications Program (CAMP) Clinic-- [facility])      Name: Karen Barber  Date of Birth: Jan 16, 1966  MRN: [id number]      No Show and Called pt for CAMP Clinic an initial appointment on 11/06/19 @ 10:00 AM.  No answer, unable to leave message. No show letter sent to patient.      Otelia Limes, PharmD, MS, BCPS, CPP  Clinical Pharmacist - [place] Assessment of Medications Program (CAMP) Clinic  [phone]

## 2019-11-10 NOTE — Unmapped (Signed)
Assessment and Plan:     Karen Barber was seen today for diabetes and wound check.    Diagnoses and all orders for this visit:    Type 2 diabetes mellitus with hyperglycemia, with long-term current use of insulin (CMS-HCC)  HGB A1c 9.9 (up from 9.7 five months ago). Currently on prednisone which is contributory to hyperglycemia.   Continue metformin 1000 mg BID and Novolog with meals at sliding scale dosage.   Increase Lantus - advised to increase by 3 units every 3 nights until 30 units HS. Contact clinic if BGs remain elevated.  Advised to schedule retinal eye exam.  -     POCT glycosylated hemoglobin (Hb A1C)    Diabetic ulcer of toe of right foot associated with type 2 diabetes mellitus, limited to breakdown of skin (CMS-HCC)  Rx oral clindamycin 300 mg QID x10 days and topical mupirocin BID for 7 days.  Referred to podiatry for further evaluation and treatment.  Avoid hydrogen peroxide. Advised to use saline, epsom salt soak or isopropyl alcohol to clean wound.   -     clindamycin (CLEOCIN) 300 MG capsule; Take 1 capsule (300 mg total) by mouth Four (4) times a day for 10 days.  -     mupirocin (BACTROBAN) 2 % ointment; Apply topically Two (2) times a day for 7 days.  -     Ambulatory referral to Podiatry; Future    Return in about 4 weeks (around 12/09/2019) for Next scheduled follow up.    HPI:      Karen Barber  is here for   Chief Complaint   Patient presents with   ??? Diabetes     f/u   ??? Wound Check     Toe; might be infected     Diabetes: Patient presents for follow up of diabetes.  A1C goal is <8.  Diabetes has customarily not been at goal (complicated by: prednisone).  Current symptoms include: foot ulcerations. Symptoms have gradually worsened. Patient denies hyperglycemia, hypoglycemia , increased appetite, nausea, paresthesia of the feet, polydipsia, polyuria, visual disturbances, vomiting and weight loss. Evaluation to date has included: hemoglobin A1C.  Home sugars: 260s in AM; 400-500s post prandial. Current treatment: metformin 1000 mg BID, Lantus 18 units HS, Novolog with meals at sliding scale dosage (usually 6-16 units). She recently lost her sliding scale dosing directions, requests new copy. Doing regular exercise: no.     Patient presents for follow up tinea pedis of right little toe. She was prescribed ketoconazole cream last visit, but states she was only able to apply it twice before her dog got ahold of it and ate it. She has been applying hydrogen peroxide QID, OTC antifungal cream and triple antibiotic cream.   She denies associated pain or pruritis.       ROS:      Comprehensive 10 point ROS negative unless otherwise stated in the HPI.      [facility] Components:     Medication adherence and barriers to the treatment plan have been addressed. Opportunities to optimize healthy behaviors have been discussed. Patient / caregiver voiced understanding.    Past Medical/Surgical History:     Past Medical History:   Diagnosis Date   ??? 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 Breast injury     lung surg on left incision under breast sept 2016   ??? Caregiver burden     parents    ???  Hepatitis C antibody test positive 2016    repeatedly negative HCV RNA, indicating clearance of infection w/o treatment   ??? Hyperlipidemia    ??? Hypertension    ??? 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 On home O2 2008    per Dr Mal Amabile note from 02/21/2017   ??? Pulmonary aspergillosis (CMS-HCC) 2016    A.fumigatus in 2016, prior to LUL lobectomy. A.niger from sputum 08/2016-09/2016.   ??? 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 Sarcoidosis 1995   ??? Type 2 diabetes mellitus with hyperglycemia (CMS-HCC) 07/27/2014   ??? Visual impairment     glasses     Past Surgical History:   Procedure Laterality Date   ??? LUNG LOBECTOMY     ??? PR RIGHT HEART CATH O2 SATURATION & CARDIAC OUTPUT N/A 04/29/2019    Procedure: Right Heart Catheterization;  Surgeon: Rosana Hoes, MD;  Location: [facility] CATH;  Service: Cardiology   ??? PR THORACOSCOPY SURG TOT PULM DECORT Left 12/14/2014    Procedure: THORACOSCOPY SURG; W/TOT PULM DECORTIC/PNEUMOLYS;  Surgeon: Evert Kohl, MD;  [facility];  Service: Cardiothoracic   ??? PR THORACOSCOPY W/THERA WEDGE RESEXN INITIAL UNILAT Left 12/03/2014    Procedure: THORACOSCOPY, SURGICAL; WITH THERAPEUTIC WEDGE RESECTION (EG, MASS, NODULE) INITIAL UNILATERAL;  Surgeon: Evert Kohl, MD;  Location: MAIN OR [facility];  Service: Cardiothoracic       Family History:     Family History   Problem Relation Age of Onset   ??? Diabetes Father    ??? Diabetes Brother    ??? Diabetes Maternal Aunt    ??? Sarcoidosis Maternal Aunt    ??? Diabetes Maternal Uncle    ??? Diabetes Paternal Aunt    ??? Diabetes Paternal Uncle        Social History:     Social History     Tobacco Use   ??? Smoking status: Former Smoker     Packs/day: 0.50     Years: 9.00     Pack years: 4.50     Types: Cigarettes     Quit date: 05/03/1987     Years since quitting: 32.5   ??? Smokeless tobacco: Never Used   Vaping Use   ??? Vaping Use: Never used   Substance Use Topics   ??? Alcohol use: No     Alcohol/week: 0.0 standard drinks   ??? Drug use: No       Allergies:     Patient has no known allergies.    Current Medications:     Current Outpatient Medications   Medication Sig Dispense Refill   ??? ACCU-CHEK FASTCLIX LANCET DRUM Misc USE TO CHECK GLUCOSE 4 TIMES DAILY BEFORE MEAL(S) AND NIGHTLY     ??? ACCU-CHEK GUIDE GLUCOSE METER Misc 1 Device by Other route Four (4) times a day (before meals and nightly). AS DIRECTED 1 kit 0   ??? albuterol 2.5 mg /3 mL (0.083 %) nebulizer solution Inhale 3 mL (2.5 mg total) by nebulization every six (6) hours as needed for wheezing or shortness of breath (airway clearance/cough). 120 mL 0   ??? albuterol HFA 90 mcg/actuation inhaler Inhale 2 puffs every four (4) hours as needed for wheezing or shortness of breath. 8.5 g 3   ??? blood sugar diagnostic Strp by Other route Three (3) times a day. 200 strip 2   ??? budesonide-formoteroL (SYMBICORT) 160-4.5 mcg/actuation inhaler Inhale 2 puffs Two (2) times a  day. 10.2 g 3   ??? cyclobenzaprine (FLEXERIL) 10 MG tablet Take 1 tablet (10 mg total) by mouth Three (3) times a day as needed. 90 tablet 0   ??? empty container Misc USE AS DIRECTED 1 each 2   ??? EPINEPHrine (EPIPEN 2-PAK) 0.3 mg/0.3 mL injection Inject 0.3 mL (0.3 mg total) into the muscle once for 1 dose. 1 Device 1   ??? gabapentin (NEURONTIN) 300 MG capsule Take 1 capsule (300 mg total) by mouth Three (3) times a day. 270 capsule 1   ??? hydroCHLOROthiazide (HYDRODIURIL) 25 MG tablet Take 1 tablet (25 mg total) by mouth daily. TAKE ONE (1) TABLET BY MOUTH EVERY DAY 90 tablet 1   ??? insulin ASPART (NOVOLOG U-100 INSULIN ASPART) 100 unit/mL injection Take 1-20 units SQ QID AC and HS as directed 80 mL 1   ??? insulin glargine (BASAGLAR, LANTUS) 100 unit/mL (3 mL) injection pen 18 units 30 pen 3   ??? ipratropium (ATROVENT) 0.02 % nebulizer solution Inhale 2.5 mL (500 mcg total) by nebulization Four (4) times a day. 300 mL 3   ??? MEDICAL SUPPLY ITEM Accucheck Glide Glocumeter for home glucose checks. Check blood glucose ACHS. 1 Package 0   ??? mepolizumab (NUCALA) 100 mg/mL AtIn Inject the contents of 1 syringe (100 mg) under the skin every twenty-eight (28) days. 1 mL 12   ??? metFORMIN (GLUCOPHAGE) 1000 MG tablet Take 1 tablet (1,000 mg total) by mouth 2 (two) times a day with meals. 180 tablet 1   ??? miscellaneous medical supply Misc Nebulizer machine and tubing. Patients machine is 56 years old. 1 each 0   ??? OXYGEN-AIR DELIVERY SYSTEMS MISC Dose: 2-3 LPM     ??? posaconazole (NOXAFIL) 100 mg TbEC delayed released tablet Take 200 mg by mouth daily. 60 tablet 2   ??? pravastatin (PRAVACHOL) 40 MG tablet Take 1 tablet (40 mg total) by mouth daily with evening meal. 90 tablet 1   ??? predniSONE (DELTASONE) 10 MG tablet Take 1 tablet (10 mg total) by mouth daily. Take 1 tablet daily (10 mg) daily until seen by Dr. Wellington Hampshire 30 tablet 0   ??? sildenafiL, pulm.hypertension, (REVATIO) 20 mg tablet Take 1 tablet (20 mg total) by mouth Three (3) times a day. 90 tablet 11   ??? clindamycin (CLEOCIN) 300 MG capsule Take 1 capsule (300 mg total) by mouth Four (4) times a day for 10 days. 40 capsule 0   ??? ketoconazole (NIZORAL) 2 % cream Apply 1 application topically Two (2) times a day. Use for 2-3 weeks. (Patient not taking: Reported on 11/11/2019) 30 g 11   ??? mupirocin (BACTROBAN) 2 % ointment Apply topically Two (2) times a day for 7 days. 30 g 1     Current Facility-Administered Medications   Medication Dose Route Frequency Provider Last Rate Last Admin   ??? sodium chloride 3 % nebulizer solution 4 mL  4 mL Nebulization Once Melanee Left, MD           Health Maintenance:     Health Maintenance   Topic Date Due   ??? HPV Cotest with Pap Smear (21-65)  Never done   ??? Pap Smear with Cotest HPV (21-65)  02/27/2001   ??? FIT-DNA Stool Test  Never done   ??? Zoster Vaccines (1 of 2) Never done   ??? DTaP/Tdap/Td Vaccines (2 - Td or Tdap) 07/07/2017   ??? Foot Exam  11/01/2019   ??? Urine Albumin/Creatinine Ratio  11/01/2019   ???  Retinal Eye Exam  11/15/2019   ??? Influenza Vaccine (1) 11/19/2019   ??? Hemoglobin A1c  02/11/2020   ??? Mammogram Start Age 67  02/29/2020   ??? Serum Creatinine Monitoring  05/29/2020   ??? Potassium Monitoring  05/29/2020   ??? COPD Spirometry  01/04/2022   ??? Hepatitis C Screen  Completed   ??? COVID-19 Vaccine  Completed   ??? Pneumococcal Vaccine  Completed       Immunizations:     Immunization History   Administered Date(s) Administered   ??? COVID-19 VACC,MRNA,(PFIZER)(PF)(IM) 06/11/2019, 07/02/2019   ??? INFLUENZA TIV (TRI) PF (IM) 01/02/2005, 12/14/2008   ??? Influenza Vaccine Quad (IIV4 PF) 46mo+ injectable 11/28/2012, 12/19/2014, 12/16/2015, 01/03/2017, 12/06/2017, 01/08/2019   ??? PNEUMOCOCCAL POLYSACCHARIDE 23 07/08/2007, 01/03/2017   ??? PPD Test 01/19/2015   ??? TdaP 07/08/2007     I have reviewed and (if needed) updated the patient's problem list, medications, allergies, past medical and surgical history, social and family history.     Vital Signs:     Wt Readings from Last 3 Encounters:   11/11/19 (!) 109.3 kg (241 lb)   08/22/19 (!) 104.3 kg (230 lb)   08/07/19 100.2 kg (221 lb)     Temp Readings from Last 3 Encounters:   11/11/19 36.7 ??C (98.1 ??F) (Oral)   08/22/19 35.9 ??C (96.7 ??F) (Temporal)   05/30/19 36.4 ??C (97.6 ??F)     BP Readings from Last 3 Encounters:   11/11/19 126/80   08/22/19 153/91   05/30/19 119/89     Pulse Readings from Last 3 Encounters:   11/11/19 120   08/22/19 111   05/30/19 98     Estimated body mass index is 33.63 kg/m?? as calculated from the following:    Height as of this encounter: 180.3 cm (5' 10.98).    Weight as of this encounter: 109.3 kg (241 lb).  Facility age limit for growth percentiles is 20 years.      Objective:      General: Alert and oriented x3. Well-appearing. No acute distress. Using portable oxygen.  HEENT:  Normocephalic.  Atraumatic. Conjunctiva and sclera normal. OP MMM without lesions.   Neck:  Supple. No thyroid enlargement. No adenopathy.   Heart:  Regular rate and rhythm . Normal S1, S2.  No murmurs, rubs or gallops.   Lungs:  No respiratory distress.  Lungs clear to auscultation. No wheezes, rhonchi, or rales.   GI/GU:  Soft, +BS, nondistended, non-TTP.   Extremities:  No edema. Peripheral pulses normal.   Skin:  Warm, dry. Medial aspect of right 5th toe with <0.5 cm circular ulcer. Limited to breakdown of skin.   Neuro:  Non-focal. No obvious weakness.   Psych:  Affect normal, eye contact good, speech clear and coherent.      I attest that I, Bayard Hugger, personally documented this note while acting as scribe for Noralyn Pick, FNP.      Bayard Hugger, Scribe.  11/11/2019     The documentation recorded by the scribe accurately reflects the service I personally performed and the decisions made by me.    Noralyn Pick, FNP

## 2019-11-11 ENCOUNTER — Ambulatory Visit: Admit: 2019-11-11 | Discharge: 2019-11-12 | Payer: MEDICARE | Attending: Family | Primary: Family

## 2019-11-11 DIAGNOSIS — E11621 Type 2 diabetes mellitus with foot ulcer: Principal | ICD-10-CM

## 2019-11-11 DIAGNOSIS — Z794 Long term (current) use of insulin: Secondary | ICD-10-CM

## 2019-11-11 DIAGNOSIS — L97511 Non-pressure chronic ulcer of other part of right foot limited to breakdown of skin: Principal | ICD-10-CM

## 2019-11-11 DIAGNOSIS — E1165 Type 2 diabetes mellitus with hyperglycemia: Principal | ICD-10-CM

## 2019-11-11 MED ORDER — MUPIROCIN 2 % TOPICAL OINTMENT
Freq: Two times a day (BID) | TOPICAL | 1 refills | 0 days | Status: CP
Start: 2019-11-11 — End: 2019-11-18

## 2019-11-11 MED ORDER — CLINDAMYCIN HCL 300 MG CAPSULE
ORAL_CAPSULE | Freq: Four times a day (QID) | ORAL | 0 refills | 10.00000 days | Status: CP
Start: 2019-11-11 — End: 2019-11-21

## 2019-11-11 NOTE — Unmapped (Addendum)
Sliding scale insulin (regular insulin, Novolog)  0-150/no insulin  151-199 2-4 units   200-250/6 units   251-300/8 units   301-350/10 units   351 to greater than 400/12 units     [facility] Patient Education        Diabetic Foot Ulcer: Care Instructions  Overview  Diabetes can damage the nerve endings and blood vessels in your feet. That means you are less likely to notice when your feet are injured. A small skin problem like a callus, blister, or cracked skin can turn into a larger sore, called a foot ulcer. Foot ulcers form most often on the pad (ball) of the foot or the bottom of the big toe. You can also get them on the top and bottom of each toe.  Foot ulcers can get infected. If the infection is severe, then tissue in the foot can die. This is called gangrene. In that case, one or more of the toes, part or all of the foot, and sometimes part of the leg may have to be removed (amputated).  Your doctor may have removed the dead tissue and cleaned the ulcer. Your foot wound may be wrapped in a protective bandage. It is very important to keep your weight off your injured foot. After a foot ulcer has formed, it will not heal as long as you keep putting weight on the area.  Always get early treatment for foot problems. A minor irritation can lead to a major problem if it's not taken care of soon.  Follow-up care is a key part of your treatment and safety. Be sure to make and go to all appointments, and call your doctor if you are having problems. It's also a good idea to know your test results and keep a list of the medicines you take.  How can you care for yourself at home?  ?? Follow your doctor's instructions about keeping pressure off the foot ulcer. You may need to use crutches or a wheelchair. Or you may wear a cast or a walking boot.  ?? Follow your doctor's instructions on how to clean the ulcer and change the bandage.  ?? If your doctor prescribed antibiotics, take them as directed. Do not stop taking them just because you feel better. You need to take the full course of antibiotics.  To prevent foot ulcers  ?? Keep your blood sugar in your target range by watching what and how much you eat. Track your blood sugar, take medicines if prescribed, and get regular exercise.  ?? Do not smoke. Smoking affects blood flow and can make foot problems worse. If you need help quitting, talk to your doctor about stop-smoking programs and medicines. These can increase your chances of quitting for good.  ?? Do not go barefoot. Protect your feet by wearing shoes that fit well. Choose shoes that are made of materials that are flexible and breathable, such as leather or cloth.  ?? Inspect your feet daily for blisters, cuts, cracks, or sores. If you can't see well, use a mirror or have someone help you.  ?? Have your doctor check your feet during each visit. If you have a foot problem, see your doctor. Do not try to treat your foot problem on your own. Home remedies or treatments that you can buy without a prescription (such as corn removers) can be harmful.  When should you call for help?   Call your doctor now or seek immediate medical care if:  ?? ?? You have  symptoms of infection, such as:  ? Increased pain, swelling, warmth, or redness.  ? Red streaks leading from the area.  ? Pus draining from the area.  ? A fever.   Watch closely for changes in your health, and be sure to contact your doctor if:  ?? ?? You have a new problem with your feet, such as:  ? A new sore or ulcer.  ? A break in the skin that is not healing after several days.  ? Bleeding corns or calluses.  ? An ingrown toenail.   ?? ?? You do not get better as expected.   Where can you learn more?  Go to [facility] at https://myuncchart.org  Select Patient Education under [employer]. Enter T131 in the search box to learn more about Diabetic Foot Ulcer: Care Instructions.  Current as of: November 18, 2018??????????????????????????????Content Version: 12.9  ?? 2006-2021 [employer]. Care instructions adapted under license by [facility] If you have questions about a medical condition or this instruction, always ask your healthcare professional. Healthwise, Incorporated disclaims any warranty or liability for your use of this information.         Increase by 3 units every 3 nights   21 units for 3 nights  24 units for 3 nights  27 units for 3 nights  30 units every night

## 2019-11-11 NOTE — Unmapped (Signed)
[facility] Assessment of Medications Program (CAMP)                    RECRUITMENT SUMMARY NOTE       Patient was outreached to reschedule CAMP appointment. Unable to contact- left message.    Benna Dunks [place]  Clinical Operations Specialist/CPhT  [employer] of Medication [facility] (CAMP)  (P) [phone]

## 2019-11-13 NOTE — Unmapped (Signed)
[place] Assessment of Medications Program (CAMP)                    RECRUITMENT SUMMARY NOTE       Patient was outreached to reschedule CAMP appointment. Unable to contact- left message. x2        Tenna Delaine, CPhT  Clinical Operations Specialist   Assessment of Medications Program (CAMP)  p [phone]  -  f [phone]

## 2019-11-14 ENCOUNTER — Telehealth: Admit: 2019-11-14 | Discharge: 2019-11-15 | Payer: MEDICARE | Attending: Pulmonary Disease | Primary: Pulmonary Disease

## 2019-11-14 MED ORDER — AMOXICILLIN 875 MG-POTASSIUM CLAVULANATE 125 MG TABLET
ORAL_TABLET | Freq: Two times a day (BID) | ORAL | 0 refills | 14 days | Status: CP
Start: 2019-11-14 — End: 2019-11-28

## 2019-11-14 NOTE — Unmapped (Signed)
RETURN PULMONARY CLINIC VISIT:    Patient: Karen Barber(11-Sep-1965)  Reason for visit: Hospital discharge follow up; pulmonary sarcoidosis    HISTORY OF PRESENT ILLNESS:     Karen Barber is a pleasant 54 y.o. African-American woman with history of sarcoidosis, it was diagnosed by lip biopsy in 1995. She has been on 2 lpm [place] continuous oxygen for over 10 years now.  She also has a history of Aspergillus pneumonia and she was treated with voriconazole and left upper lobectromy in 11/2014.  She is currently not on any immunomodulating agents.      Recently admitted to [facility] for acute on chronic hypoxic respiratory failure and increasing dyspnea. Discharged on 02/18/18. Patient needed up to 5 LPM with exertion and 3 LPM at rest from her usual baseline of 2-3 LPM. CTA at admission showed increasing lymphadenopathy, but no PE or increasing parenchymal changes. Echo showed normal RV and LV function. She was treated with Augmentin, airway clearance and eventually the addition of Prednisone 60 mg daily, tapered to 40 mg daily at discharge. ID was consulted given her history of chronic invasive aspergillosis and recent NTM AFB growth from sputum of M. Fortuitum. Repeat AFB cultures from hospitalization have shown no growth to date. She has since seen her primary ID physician who changed her from Voriconazole to Posaconazole. At her last visit with me in 02/2018, she was referred for consideration of lung transplant, however, was deemed not a candidate due to anatomic abnormalities that would not allow the surgery to be performed. Follow up PFTs were also ordered to monitor with her Prednisone taper, however, these were never completed.    She reports increasing shortness of breath when weaning off of her prednisone.       REVIEW OF SYSTEMS: A comprehensive ROS was performed was reviewed and found to be negative except as above in the HPI.      INTERVAL HPI 11/13/19  Patient called in today complaining of increasing productive cough. She denies hemoptysis, fever, change in her chronic DOE or LE swelling. She states that she is using her usual supplemental O2 at 5-7 L/min. She remains on prednisone 10 mg daily, posaconazole, and sildenafil l10 mg TID (low dose due to interaction with posaconazole). She has been using Symbicort PRN but continues to get Nucala monthly.  She is also adherent to airway clearance with albuterol and hypertonic saline saline 7% but does not use the Acapella.    Denies dizziness, lightheadedness or syncope.        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  She is a lifelong nonsmoker.    MEDICATIONS AND ALLERGIES:     CURRENT MEDICATIONS:  Outpatient Encounter Medications as of 11/14/2019   Medication Sig Dispense Refill   ??? ACCU-CHEK FASTCLIX LANCET DRUM Misc USE TO CHECK GLUCOSE 4 TIMES DAILY BEFORE MEAL(S) AND NIGHTLY     ??? ACCU-CHEK GUIDE GLUCOSE METER Misc 1 Device by Other route Four (4) times a day (before meals and nightly). AS DIRECTED 1 kit 0   ???  albuterol 2.5 mg /3 mL (0.083 %) nebulizer solution Inhale 3 mL (2.5 mg total) by nebulization every six (6) hours as needed for wheezing or shortness of breath (airway clearance/cough). 120 mL 0   ??? albuterol HFA 90 mcg/actuation inhaler Inhale 2 puffs every four (4) hours as needed for wheezing or shortness of breath. 8.5 g 3   ??? blood sugar diagnostic Strp by Other route Three (3) times a day. 200 strip 2   ??? budesonide-formoteroL (SYMBICORT) 160-4.5 mcg/actuation inhaler Inhale 2 puffs Two (2) times a day. 10.2 g 3   ??? clindamycin (CLEOCIN) 300 MG capsule Take 1 capsule (300 mg total) by mouth Four (4) times a day for 10 days. 40 capsule 0   ??? cyclobenzaprine (FLEXERIL) 10 MG tablet Take 1 tablet (10 mg total) by mouth Three (3) times a day as needed. 90 tablet 0   ??? empty container Misc USE AS DIRECTED 1 each 2   ??? EPINEPHrine (EPIPEN 2-PAK) 0.3 mg/0.3 mL injection Inject 0.3 mL (0.3 mg total) into the muscle once for 1 dose. 1 Device 1   ??? gabapentin (NEURONTIN) 300 MG capsule Take 1 capsule (300 mg total) by mouth Three (3) times a day. 270 capsule 1   ??? hydroCHLOROthiazide (HYDRODIURIL) 25 MG tablet Take 1 tablet (25 mg total) by mouth daily. TAKE ONE (1) TABLET BY MOUTH EVERY DAY 90 tablet 1   ??? insulin ASPART (NOVOLOG U-100 INSULIN ASPART) 100 unit/mL injection Take 1-20 units SQ QID AC and HS as directed 80 mL 1   ??? insulin glargine (BASAGLAR, LANTUS) 100 unit/mL (3 mL) injection pen 18 units 30 pen 3   ??? ipratropium (ATROVENT) 0.02 % nebulizer solution Inhale 2.5 mL (500 mcg total) by nebulization Four (4) times a day. 300 mL 3   ??? ketoconazole (NIZORAL) 2 % cream Apply 1 application topically Two (2) times a day. Use for 2-3 weeks. (Patient not taking: Reported on 11/11/2019) 30 g 11   ??? MEDICAL SUPPLY ITEM Accucheck Glide Glocumeter for home glucose checks. Check blood glucose ACHS. 1 Package 0   ??? mepolizumab (NUCALA) 100 mg/mL AtIn Inject the contents of 1 syringe (100 mg) under the skin every twenty-eight (28) days. 1 mL 12   ??? metFORMIN (GLUCOPHAGE) 1000 MG tablet Take 1 tablet (1,000 mg total) by mouth 2 (two) times a day with meals. 180 tablet 1   ??? miscellaneous medical supply Misc Nebulizer machine and tubing. Patients machine is 56 years old. 1 each 0   ??? mupirocin (BACTROBAN) 2 % ointment Apply topically Two (2) times a day for 7 days. 30 g 1   ??? OXYGEN-AIR DELIVERY SYSTEMS MISC Dose: 2-3 LPM     ??? posaconazole (NOXAFIL) 100 mg TbEC delayed released tablet Take 200 mg by mouth daily. 60 tablet 2   ??? pravastatin (PRAVACHOL) 40 MG tablet Take 1 tablet (40 mg total) by mouth daily with evening meal. 90 tablet 1   ??? predniSONE (DELTASONE) 10 MG tablet Take 1 tablet (10 mg total) by mouth daily. Take 1 tablet daily (10 mg) daily until seen by Dr. Wellington Hampshire 30 tablet 0   ??? sildenafiL, pulm.hypertension, (REVATIO) 20 mg tablet Take 1 tablet (20 mg total) by mouth Three (3) times a day. 90 tablet 11     Facility-Administered Encounter Medications as of 11/14/2019   Medication Dose Route Frequency Provider Last Rate Last Admin   ??? sodium chloride 3 % nebulizer solution 4 mL  4 mL Nebulization Once  Melanee Left, MD           ALLERGIES:  Allergies as of 11/14/2019   ??? (No Known Allergies)       PHYSICAL EXAM:     N/A      DIAGNOSTIC REVIEW:     All imaging and labs were reviewed personally.      Echo 12/30/18:  1. The left ventricle is normal in size with mildly increased wall  thickness.    2. Normal left ventricular systolic function, ejection fraction 60-65%.    3. Diastolic dysfunction - grade I (normal filling pressures).    4. Aortic sclerosis.    5. Normal right ventricular size and systolic function.    6. Pulmonary systolic pressure cannot be estimated due to insufficient TR  jet.    PET scan 12/09/18  - Metabolically active fibrosis and mediastinal and supraclavicular nodes likely reflect active disease; overall level of avidity is moderate, with uptake similar to liver. Appearance of CT is mildly improved from prior, although substantial fibrosis remains.    CTA PE Protocol 02/13/18:  No pulmonary embolism or acute airspace disease.    Since prior study dated 09/26/2016, there has been progressive mediastinal lymphadenopathy, and pulmonary fibrosis, related to end-stage sarcoidosis.   ??  Dilated main pulmonary artery  as can be seen with pulmonary hypertension.      EKG 02/13/18  SINUS TACHYCARDIA WITH 1ST DEGREE AV BLOCK  PULMONARY DISEASE PATTERN  LEFT ANTERIOR FASCICULAR BLOCK  NONSPECIFIC T WAVE ABNORMALITY  ABNORMAL ECG  WHEN COMPARED WITH ECG OF 14-Dec-2017 13:05,  PREMATURE VENTRICULAR BEATS ARE NO LONGER PRESENT  QRS AXIS SHIFTED LEFT  Confirmed by Warnell Forester ([id number]) on 02/13/2018 10:03:18 PM    LFTs  Lab Results   Component Value Date    ALKPHOS 80 05/30/2019    BILITOT 0.5 08/22/2019    BILIDIR <0.10 05/30/2019    PROT 7.2 05/30/2019    ALBUMIN 4.0 05/30/2019    ALT 12 08/22/2019    AST 8 08/22/2019      Calcium   Date Value Ref Range Status   05/30/2019 9.4 8.5 - 10.2 mg/dL Final      Echocardiography 12/30/2018    1. The left ventricle is normal in size with mildly increased wall  thickness.    2. Normal left ventricular systolic function, ejection fraction 60-65%.    3. Diastolic dysfunction - grade I (normal filling pressures).    4. Aortic sclerosis.    5. Normal right ventricular size and systolic function.    6. Pulmonary systolic pressure cannot be estimated due to insufficient TR  Jet.    Right Heart Catheterization 04/29/19  ??  ?? Pressures   Right atrium Mean 5 mm Hg   Right ventricle Systolic 57 mm Hg, End-diastolic 8 mm Hg   Pulmonary artery  Systolic 55 mm Hg, Diastolic 17 mm Hg, Mean 33 mm Hg   Pulmonary capillary wedge Mean 12 mm Hg   ??  Arterial saturation: 89% (pulse oximetry)  Mixed venous saturation: 50%  ??  Fick cardiac output: 4.9 L/min  Fick cardiac index: 2.1 L/min/m^2  ??  Thermal cardiac output: 5.1 L/min  Thermal cardiac index: 2.3 L/min/m^2  ??  Systemic vascular resistance (SVR): 1616.33 dynes*s/cm^5  Pulmonary vascular resistance (PVR): 4.29 Wood units      ASSESSMENT AND PLAN:     Karen Barber is a pleasant 54 y.o. African-American woman with history of sarcoidosis with obstructive and restrictive lung disease and  aspergillus s/p LUL lobectomy with recurrence in 08/2016, now on chronic posaconazole whom we are seeing virtually for follow-up management of the same. Currently, on prednisone 10 mg since her last hospitalization 02/13/18-02/18/18.    Her symptoms improved previously with Augmentin, airway clearance and prednisone. CTA negative for PE but increased lymphadenopathy and potentially worsening parenchymal disease from prior although interpretation is limited by artifact and PE protocol study. Echo with normal cardiac function and no evidence of elevated PASP or right heart disease but grade 1 diastolic dysfunction.  Continues to have dyspnea on exertion despite being on prednisone for 8 weeks.  Etiology of her symptoms is likely active pulmonary sarcoidosis given prior CT findings and PET scan. Other potential causes include infection or possibly related to her underlying reactive airway disease.  Pulmonary hypertension is also a consideration with a potential pulmonary artery stenosis from prior PA plasty during her left upper lobectomy in 2016 or extrinsic compression by enlarged LNs.     She also had NTM AFB in prior sputum (M. Fortuitum). This has grown twice previously from expectorated sputum. Unlikely to represent pathogen currently given that current symptoms improved with Prednisone and abx. Repeat AFB from recently hospitalization remain NGTD although are not finalized currently. Will consider treating this if symptoms persist.    I suspect bronchiectasis has a central role in her symptoms especially that she was not using any airway clearance measure until recently. Improvement previously with airway clearance and abx (Augmentin) further supports this (ie, suggests contribution from bacterial infection). We discussed at length the importance of using albuterol, HTS, and her airway clearance device.    I encouraged her to continue aggressive airway clearance. IAsthma possibly contributing as well and she previously was on Nucala. She is currently using Advair as needed while on albuterol neb q6 hrs. We discussed using Advair regularily to facilitate weaning off prednisone..     Her pulmonary sarcoidosis is challenging. There is evidence of a ctive parenchymal sarcoidosis on most recent PET scan. However, intensifying immunosuppressive therapy will be challenging given active infection (invasive aspergillosis). There will be a risk of activating mycobacterial disease as well. If we decide to increase her steroids or add a steroid-sparing therapy, this will have to be well coordinated with our ID colleagues. I discussed enrolling her in the CLEAR therapy trial at [facility] (anti-mycobacterial therapy) as there is some evidence that treating mycobacterial Ags may restore T cell function and improve sarcoidosis. She is agreeing to this and I contacted the PI at [facility]. Unfortunately, they completed enrolling patients.     New pulmonary symptoms (increased productive cough) likely represent acute exacerbation of bronchiectasis. Therefore, will treat with PO abx in addition to aggressive airway clearance and step-up Symbicort.    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  We will work to obtain portable oxygen concentrator versus portable noninvasive oxygen supplementation and positive airway pressure.  -- Annual spirometry, DLCO   -- Annual ophthalmology and EKG (if abnormal EKG or cardiac symptoms, will proceed to cardiac MRI)  -- She has been referred for evaluation of pulmonary transplant in the past however is not a candidate due to prior  PA plasty which precludes her ability to undergo the surgery for lung transplantation her thoracic surgery  -- Continue Life 2000 home ventilator.      Bronchiectasis with acute exacerbation:  -- Encouraged to use of airway clearance device, aiming for twice daily and can pre-treat with Albuterol to improve clearance  -- Continue  albuterol, ipratropium, and hypertonic saline 7% twice daily for airway clearance  -- PO Augmentin for 2 weeks (previously lower resp cx with OP flora, previously improved on Augmentin)    Chronic Invasive Aspergillosis: s/p LUL lobectomy. Recurrence in 08/2016  -- Currently on posaconazole (changed in January 2020 from voriconazole per ID recommendations).   -- Continue to follow with ID.  -- Fungal sputum culture, lower respiratory culture and AFB next visit    Asthma:  -- Continue Symbicort inh (asked to use it twice daily) and Nucala    NTM AFB from prior sputum (M. Fortuitum), likely represents colonization/contamination. Has not grown on most recent AFB cultures. No indication for treatment currently.  --Repeat AFB sputum culture every visit    OSA symptoms:  -- Sleep study     Health maintenance:  -- Vaccines: up-to-date  -- Smoking cessation: N/A (lifelong non-smoker)  -- Lung cancer screening: N/A      RTC as previously scheduled in 3 months     Rohen Kimes O. Wellington Hampshire, MD   Assistant Professor of Medicine  Pulmonary and Critical Care Medicine  [facility] Pulmonary Hypertension Program    I spent 25 minutes on the phone visit with the patient. I spent an additional 10 minutes on pre- and post-visit activities.     The patient was not located and I was located within 250 yards of a hospital based location during the phone visit. The patient was physically located in [place] or a state in which I am permitted to provide care. The patient and/or parent/guardian understood that s/he may incur co-pays and cost sharing, and agreed to the telemedicine visit. The visit was reasonable and appropriate under the circumstances given the patient's presentation at the time.    The patient and/or parent/guardian has been advised of the potential risks and limitations of this mode of treatment (including, but not limited to, the absence of in-person examination) and has agreed to be treated using telemedicine. The patient's/patient's family's questions regarding telemedicine have been answered.    If the visit was completed in an ambulatory setting, the patient and/or parent/guardian has also been advised to contact their provider???s office for worsening conditions, and seek emergency medical treatment and/or call 911 if the patient deems either necessary.

## 2019-11-14 NOTE — Unmapped (Signed)
Thank you for allowing me to be a part of your care.  Please call the clinic with any questions.  ??  Christen Butter, MD  Pulmonary and Critical Care Medicine  [address]  [id number]  [place], [place] [address]  ??  Thank you for your visit to the [facility].   ??  Between appointments, you can reach [place] at these numbers:  ??  For appointments or the Pulmonary Nurse: [phone]  Fax: [phone]  ??  For urgent issues after hours:  Hospital Operator: [phone], ask for Pulmonary Fellow on call

## 2019-11-17 NOTE — Unmapped (Signed)
[facility] Clinical Assessment & Refill Coordination Note    Karen Barber, DOB: Dec 12, 1965  Phone: [phone] (home) [phone] (work)    All above HIPAA information was verified with patient.     Was a Nurse, learning disability used for this call? No    Specialty Medication(s):   CF/Pulmonary: -Nucala 100mg /ml     Current Outpatient Medications   Medication Sig Dispense Refill   ??? ACCU-CHEK FASTCLIX LANCET DRUM Misc USE TO CHECK GLUCOSE 4 TIMES DAILY BEFORE MEAL(S) AND NIGHTLY     ??? ACCU-CHEK GUIDE GLUCOSE METER Misc 1 Device by Other route Four (4) times a day (before meals and nightly). AS DIRECTED 1 kit 0   ??? albuterol 2.5 mg /3 mL (0.083 %) nebulizer solution Inhale 3 mL (2.5 mg total) by nebulization every six (6) hours as needed for wheezing or shortness of breath (airway clearance/cough). 120 mL 0   ??? albuterol HFA 90 mcg/actuation inhaler Inhale 2 puffs every four (4) hours as needed for wheezing or shortness of breath. 8.5 g 3   ??? amoxicillin-clavulanate (AUGMENTIN) 875-125 mg per tablet Take 1 tablet by mouth every twelve (12) hours for 14 days. 28 tablet 0   ??? blood sugar diagnostic Strp by Other route Three (3) times a day. 200 strip 2   ??? budesonide-formoteroL (SYMBICORT) 160-4.5 mcg/actuation inhaler Inhale 2 puffs Two (2) times a day. 10.2 g 3   ??? clindamycin (CLEOCIN) 300 MG capsule Take 1 capsule (300 mg total) by mouth Four (4) times a day for 10 days. 40 capsule 0   ??? cyclobenzaprine (FLEXERIL) 10 MG tablet Take 1 tablet (10 mg total) by mouth Three (3) times a day as needed. 90 tablet 0   ??? empty container Misc USE AS DIRECTED 1 each 2   ??? EPINEPHrine (EPIPEN 2-PAK) 0.3 mg/0.3 mL injection Inject 0.3 mL (0.3 mg total) into the muscle once for 1 dose. 1 Device 1   ??? gabapentin (NEURONTIN) 300 MG capsule Take 1 capsule (300 mg total) by mouth Three (3) times a day. 270 capsule 1   ??? hydroCHLOROthiazide (HYDRODIURIL) 25 MG tablet Take 1 tablet (25 mg total) by mouth daily. TAKE ONE (1) TABLET BY MOUTH EVERY DAY 90 tablet 1   ??? insulin ASPART (NOVOLOG U-100 INSULIN ASPART) 100 unit/mL injection Take 1-20 units SQ QID AC and HS as directed 80 mL 1   ??? insulin glargine (BASAGLAR, LANTUS) 100 unit/mL (3 mL) injection pen 18 units 30 pen 3   ??? ipratropium (ATROVENT) 0.02 % nebulizer solution Inhale 2.5 mL (500 mcg total) by nebulization Four (4) times a day. 300 mL 3   ??? ketoconazole (NIZORAL) 2 % cream Apply 1 application topically Two (2) times a day. Use for 2-3 weeks. (Patient not taking: Reported on 11/11/2019) 30 g 11   ??? MEDICAL SUPPLY ITEM Accucheck Glide Glocumeter for home glucose checks. Check blood glucose ACHS. 1 Package 0   ??? mepolizumab (NUCALA) 100 mg/mL AtIn Inject the contents of 1 syringe (100 mg) under the skin every twenty-eight (28) days. 1 mL 12   ??? metFORMIN (GLUCOPHAGE) 1000 MG tablet Take 1 tablet (1,000 mg total) by mouth 2 (two) times a day with meals. 180 tablet 1   ??? miscellaneous medical supply Misc Nebulizer machine and tubing. Patients machine is 56 years old. 1 each 0   ??? mupirocin (BACTROBAN) 2 % ointment Apply topically Two (2) times a day for 7 days. 30 g 1   ???  OXYGEN-AIR DELIVERY SYSTEMS MISC Dose: 2-3 LPM     ??? posaconazole (NOXAFIL) 100 mg TbEC delayed released tablet Take 200 mg by mouth daily. 60 tablet 2   ??? pravastatin (PRAVACHOL) 40 MG tablet Take 1 tablet (40 mg total) by mouth daily with evening meal. 90 tablet 1   ??? predniSONE (DELTASONE) 10 MG tablet Take 1 tablet (10 mg total) by mouth daily. Take 1 tablet daily (10 mg) daily until seen by Dr. Wellington Hampshire 30 tablet 0   ??? sildenafiL, pulm.hypertension, (REVATIO) 20 mg tablet Take 1 tablet (20 mg total) by mouth Three (3) times a day. 90 tablet 11     Current Facility-Administered Medications   Medication Dose Route Frequency Provider Last Rate Last Admin   ??? sodium chloride 3 % nebulizer solution 4 mL  4 mL Nebulization Once Melanee Left, MD            Changes to medications: [name] reports starting the following medications: Starting antibiotic for a lung infection this weekend, feeling better since starting it    No Known Allergies    Changes to allergies: No    SPECIALTY MEDICATION ADHERENCE     Nucala 100 mg/ml: 0 days of medicine on hand       Medication Adherence    Patient reported X missed doses in the last month: 0  Specialty Medication: Nucala 100mg /ml  Patient is on additional specialty medications: No  Informant: patient          Specialty medication(s) dose(s) confirmed: Regimen is correct and unchanged.     Are there any concerns with adherence? No    Adherence counseling provided? Not needed    CLINICAL MANAGEMENT AND INTERVENTION      Clinical Benefit Assessment:    Do you feel the medicine is effective or helping your condition? Yes    Clinical Benefit counseling provided? Not needed    Adverse Effects Assessment:    Are you experiencing any side effects? No    Are you experiencing difficulty administering your medicine? No    Quality of Life Assessment:    How many days over the past month did your severe persistent asthma  keep you from your normal activities? For example, brushing your teeth or getting up in the morning. was in ER on Friday and had lung infection, still continuing Nucala    Have you discussed this with your provider? Yes    Therapy Appropriateness:    Is therapy appropriate? Yes, therapy is appropriate and should be continued    DISEASE/MEDICATION-SPECIFIC INFORMATION      For patients on injectable medications: Patient currently has 0 doses left.  Next injection is scheduled for 8/28--will take ASAP on 8/31.    PATIENT SPECIFIC NEEDS     - Does the patient have any physical, cognitive, or cultural barriers? No    - Is the patient high risk? No    - Does the patient require a Care Management Plan? No     - Does the patient require physician intervention or other additional services (i.e. nutrition, smoking cessation, social work)? No      SHIPPING Specialty Medication(s) to be Shipped:   CF/Pulmonary: -Nucala 100mg /ml    Other medication(s) to be shipped: No additional medications requested for fill at this time     Changes to insurance: No    Delivery Scheduled: Yes, Expected medication delivery date: 8/31.     Medication will be delivered via Same Day Courier to the confirmed  prescription address in [facility].    The patient will receive a drug information handout for each medication shipped and additional FDA Medication Guides as required.  Verified that patient has previously received a Conservation officer, historic buildings.    All of the patient's questions and concerns have been addressed.    Julianne Rice   [facility] Specialty Pharmacist

## 2019-11-18 MED FILL — NUCALA 100 MG/ML SUBCUTANEOUS AUTO-INJECTOR: 28 days supply | Qty: 1 | Fill #4 | Status: AC

## 2019-11-18 MED FILL — NUCALA 100 MG/ML SUBCUTANEOUS AUTO-INJECTOR: ORAL | 28 days supply | Qty: 1 | Fill #4

## 2019-11-20 ENCOUNTER — Ambulatory Visit: Admit: 2019-11-20 | Discharge: 2019-11-21 | Payer: MEDICARE

## 2019-11-20 DIAGNOSIS — E119 Type 2 diabetes mellitus without complications: Principal | ICD-10-CM

## 2019-11-20 DIAGNOSIS — L97511 Non-pressure chronic ulcer of other part of right foot limited to breakdown of skin: Secondary | ICD-10-CM

## 2019-11-20 DIAGNOSIS — E11621 Type 2 diabetes mellitus with foot ulcer: Principal | ICD-10-CM

## 2019-11-20 DIAGNOSIS — B353 Tinea pedis: Principal | ICD-10-CM

## 2019-11-20 DIAGNOSIS — B351 Tinea unguium: Principal | ICD-10-CM

## 2019-11-20 MED ORDER — KETOCONAZOLE 2 % TOPICAL CREAM
Freq: Two times a day (BID) | TOPICAL | 5 refills | 60.00000 days | Status: CP
Start: 2019-11-20 — End: 2020-11-19

## 2019-11-20 NOTE — Unmapped (Addendum)
WOUND CARE INSTRUCTIONS:        Wash area with Antibacterial liquid soap with one wash cloth, clean the soap off with a clean cloth, and then pat dry with a dry cloth before the dressing change.    Dressing:   Betadine gauze and tape to the right 5th toe    If you have any questions or concerns regarding your wound or wound care, please contact us at the [facility] Podiatry clinic at [phone].

## 2019-11-20 NOTE — Unmapped (Unsigned)
Podiatry Clinic Note:    Assessment:  ***Right foot *** toe ulceration  ***T2DM peripheral sensory neuropathy          Plan:  Patient was seen and evaluated in detail.  Patient's condition was discussed with the patient in detail.  Patient was given opportunity to ask questions.     Procedure: excisional debridement of the wound  At this time patient's wound was debrided due to the increase in devitalized tissue of epidermis/dermis, exudate, callus tissue, and other necrotic/fibrotic tissue impairing proper wound healing as reported in the Objective. All benefits, risks, and alternatives were explained in detail with the patient. A time out was taken prior to the procedure. The area was prepped in standard clinic fashion. An excisional Sharp, surgical debridement was performed to fully debride and remove all central devitalized and necrotic tissue including subcutaneous tissue. The debridement occurred to the level of subcutaneous tissue until  bleeding granulation tissue was observed in 100% of the wound bed.  This was achieved with the use of a curette, forceps and scalpel. Blood loss was minimal.  Hemostasis was achieved with pressure and the post-debridement measurements are documented in the chart. Patient tolerated procedure well at this time.    Dressings: *** dressings applied today. Dressings instructions to the patient given.     Diabetic shoes and custom functional orthotics prescribed.    Patient was educated on maintaining healthy levels of blood sugars and checking her feet daily for ulcers or blisters.     Activity: WBAT in diabetic shoes.    All questions were answered and no guarantees were given or implied. Patient expressed understanding.       History of Present Illness: Patient presents to the clinic as referred by Etheleen Nicks, FNP. Patient is a 54 y/o female with a history of T2DM, HTN, COPD, hypoxia, severe persistent asthma dependent on systemic steroids with a chief complaint of an ulceration to the right *** toe.    Patient denies any other complaints. Patient denies nausea, vomiting, fever, chills, SOB and chest pain.     A1c was 9.9% on 11/11/19.    Past Medical History:   Diagnosis Date   ??? 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 Breast injury     lung surg on left incision under breast sept 2016   ??? Caregiver burden     parents    ??? Hepatitis C antibody test positive 2016    repeatedly negative HCV RNA, indicating clearance of infection w/o treatment   ??? Hyperlipidemia    ??? Hypertension    ??? 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 On home O2 2008    per Dr Mal Amabile note from 02/21/2017   ??? Pulmonary aspergillosis (CMS-HCC) 2016    A.fumigatus in 2016, prior to LUL lobectomy. A.niger from sputum 08/2016-09/2016.   ??? 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 Sarcoidosis 1995   ??? Type 2 diabetes mellitus with hyperglycemia (CMS-HCC) 07/27/2014   ??? Visual impairment     glasses       Past Surgical History:   Procedure Laterality Date   ??? LUNG LOBECTOMY     ??? PR RIGHT HEART CATH O2 SATURATION & CARDIAC OUTPUT N/A 04/29/2019    Procedure: Right Heart Catheterization;  Surgeon: Rosana Hoes, MD;  Location: [facility] CATH;  Service: Cardiology   ??? PR THORACOSCOPY  SURG TOT PULM DECORT Left 12/14/2014    Procedure: THORACOSCOPY SURG; W/TOT PULM DECORTIC/PNEUMOLYS;  Surgeon: Evert Kohl, MD;  Location: MAIN OR [facility];  Service: Cardiothoracic   ??? PR THORACOSCOPY W/THERA WEDGE RESEXN INITIAL UNILAT Left 12/03/2014    Procedure: THORACOSCOPY, SURGICAL; WITH THERAPEUTIC WEDGE RESECTION (EG, MASS, NODULE) INITIAL UNILATERAL;  Surgeon: Evert Kohl, MD;  Location: MAIN OR [facility];  Service: Cardiothoracic             No Known Allergies     ROS:   Constitutional:  Denies fever or chills   Eyes:  Denies change in visual acuity   HENT:  Denies nasal congestion or sore throat   Respiratory:  Denies cough or shortness of breath   Cardiovascular:  Denies chest pain or edema   GI:  Denies abdominal pain, nausea, vomiting, bloody stools or diarrhea   GU:  Denies dysuria   Musculoskeletal:  Denies back pain or joint pain   Integument:  Denies rash   Neurologic:  Denies headache, focal weakness or sensory changes   Endocrine:  Denies polyuria or polydipsia   Lymphatic:  Denies swollen glands   Psychiatric:  Denies depression or anxiety       Test Results  Imaging: {GCS Imaging findings:[id number]}      Physical:   General Appearance: well nourished, Aox3    Vascular: DP/PT 2/4 bilaterally. CRT <3s x 10. Pedal hair is present to the dorsal foot and digits bilaterally. No varicosities or telangiectasia noted bilaterally. No edema noted to the leg, foot, and ankle bilaterally.     Skin is warm, smooth, and supple bilaterally. No erythema noted to the foot and ankle bilaterally. Nails are normal in thickness, color, and consistency x 10. No hyperkeratotic lesions noted bilaterally. Web spaces are clean, dry, and free of lesions and fissures x4 bilaterally. No other discolorations, lesions, or open wounds noted bilaterally.     Ulceration to ***    Neurological: Gross sensation intact bilaterally. Protective sensation is intact bilaterally. Proprioception is intact to hallux bilaterally. No paresthesia noted on percussion of Tibial Nerve in the Tarsal Tunnel bilaterally. Sharp/dull sensation is intact bilaterally. Babinsky is normal bilaterally. Clonus is negative bilaterally at the ankle joint     Muscle strength is 5/5 and active motion is pain-free and symmetrical bilaterally with plantarflexion, dorsiflexion, abduction, adduction, inversion, and eversion against resistance. No pain or crepitation with passive range of motion bilaterally to all major pedal joints.     ***picture    I personally spent 30 minutes face-to-face and non-face-to-face in the care of this patient, which includes all pre, intra, and post visit time on the date of service.

## 2019-12-01 NOTE — Unmapped (Signed)
[facility] Refill Coordination Note    Specialty Medication(s) to be Shipped:   CF/Pulmonary: -Nucala 100mg /ml  Other medication(s) to be shipped: No additional medications requested for fill at this time     Karen Barber, DOB: [birth year]  Phone: [phone] (home) [phone] (work)    All above HIPAA information was verified with patient.     Was a Nurse, learning disability used for this call? No    Completed refill call assessment today to schedule patient's medication shipment from the [facility] [phone]).       Specialty medication(s) and dose(s) confirmed: Regimen is correct and unchanged.   Changes to medications: [name] reports no changes at this time.  Changes to insurance: No  Questions for the pharmacist: No    Confirmed patient received Welcome Packet with first shipment. The patient will receive a drug information handout for each medication shipped and additional FDA Medication Guides as required.       DISEASE/MEDICATION-SPECIFIC INFORMATION        For CF patients: CF [facility] Active? No-not enrolled    SPECIALTY MEDICATION ADHERENCE     Medication Adherence    Patient reported X missed doses in the last month: 0  Specialty Medication: Nucala 100mg /ml  Patient is on additional specialty medications: No  Informant: patient  Reliability of informant: reliable        Nucala 100mg /ml : 0 days of medicine on hand (Next injection is for 28th of Sept.)    [facility]     Shipping address confirmed in Epic.     Delivery Scheduled: Yes, Expected medication delivery date: 12/09/2019.     Medication will be delivered via Same Day Courier to the prescription address in Epic WAM.    Irene Collings P Allena Katz   [facility] Specialty Technician

## 2019-12-02 NOTE — Unmapped (Signed)
Karen Barber with [facility] called stating he spoke with [facility] yesterday about a form/release they have been faxing.  They are having trouble sending & WAS given email to send to to.  He is checking to see if it was received & if not needs a return call @ [phone].

## 2019-12-02 NOTE — Unmapped (Signed)
[place] Assessment of Medications Program (CAMP)                   RECRUITMENT SUMMARY NOTE       Patient was identified for CAMP services. In-basket message sent to embedded CPP.     Benna Dunks [place]  Clinical Operations Specialist/CPhT  [employer] of [facility]ication [employer] (CAMP)  (P) [phone]

## 2019-12-08 NOTE — Unmapped (Signed)
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  Patient's condition was discussed with the patient in detail.  Patient was given opportunity to ask questions.     Patient has a right 5th toe medial aspect large ulceration with bone exposed, joint exposed. Joint fluid is coming out through the wound. Malodor noted. Edema and erythema noted to right 5th toe.     Right foot XR from 12/09/19 reviewed in detail showing right fifth phalanx osteomyelitis.    Discussed with patient need for right fifth ray amputation given worsening of her condition. Discussed with patient and family to present to the ED today for for preop work-up and IV antibiotics and in preparation for surgery tomorrow. Due to patient's multiple comorbidities, we cannot proceed with outpatient surgery. I have messaged hospital Vascular team (Dr. Lucretia Roers) and our on-call team to inform them that patient will be reporting to the ED. She will need pre-op labs and COVID PCR test. Patient understands and is in agreement with this plan.    Patient is posted for right foot fifth toe amputation surgery tomorrow    Dressings: Betadine-soaked gauze and tape applied between right 4th and 5th toes.     Patient to wear open-toed shoes to not place pressure on ulceration.    Continue ketoconazole 2% BID to affected areas. Continue cleaning shoes with Lysol spray or wipes weekly to prevent further fungal infection.    We discussed strict return precautions to the ED including redness, swelling, purulent drainage, fever, or chills.     All questions were answered and no guarantees were given or implied. Patient expressed understanding.       History of Present Illness: Patient is a 54 y/o female with a history of T2DM, HTN, COPD, hypoxia, sarcoidosis, severe persistent asthma dependent on systemic steroids who returns to the clinic regarding right 5th toe medial aspect ulcerations. Patient finished courses of Augmentin and clindamycin on 9/10 and 9/3, respectively. Today, patient reports that she is doing okay. She is currently on 5 L of oxygen. She denies being on steroids anymore. Patient denies any other complaints. Patient denies nausea, vomiting, fever, chills, SOB and chest pain.     A1c was 9.9% on 11/11/19.    Past Medical History:   Diagnosis Date   ??? 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 Breast injury     lung surg on left incision under breast sept 2016   ??? Caregiver burden     parents    ??? Hepatitis C antibody test positive 2016    repeatedly negative HCV RNA, indicating clearance of infection w/o treatment   ??? Hyperlipidemia    ??? Hypertension    ??? 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 On home O2 2008    per Dr Mal Amabile note from 02/21/2017   ??? Pulmonary aspergillosis (CMS-HCC) 2016    A.fumigatus in 2016, prior to LUL lobectomy. A.niger from sputum 08/2016-09/2016.   ??? 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 Sarcoidosis 1995   ???  Type 2 diabetes mellitus with hyperglycemia (CMS-HCC) 07/27/2014   ??? Visual impairment     glasses       Past Surgical History:   Procedure Laterality Date   ??? LUNG LOBECTOMY     ??? PR RIGHT HEART CATH O2 SATURATION & CARDIAC OUTPUT N/A 04/29/2019    Procedure: Right Heart Catheterization;  Surgeon: Rosana Hoes, MD;  Location: [facility] CATH;  Service: Cardiology   ??? PR THORACOSCOPY SURG TOT PULM DECORT Left 12/14/2014    Procedure: THORACOSCOPY SURG; W/TOT PULM DECORTIC/PNEUMOLYS;  Surgeon: Evert Kohl, MD;  Location: MAIN OR [facility];  Service: Cardiothoracic   ??? PR THORACOSCOPY W/THERA WEDGE RESEXN INITIAL UNILAT Left 12/03/2014    Procedure: THORACOSCOPY, SURGICAL; WITH THERAPEUTIC WEDGE RESECTION (EG, MASS, NODULE) INITIAL UNILATERAL;  Surgeon: Evert Kohl, MD;  Location: [facility];  Service: Cardiothoracic             No Known Allergies     ROS:   Constitutional:  Denies fever or chills   Eyes:  Denies change in visual acuity   HENT:  Denies nasal congestion or sore throat   Respiratory:  Denies cough or shortness of breath   Cardiovascular:  Denies chest pain or edema   GI:  Denies abdominal pain, nausea, vomiting, bloody stools or diarrhea   GU:  Denies dysuria   Musculoskeletal:  Denies back pain or joint pain   Integument:  Denies rash   Neurologic:  Denies headache, focal weakness or sensory changes   Endocrine:  Denies polyuria or polydipsia   Lymphatic:  Denies swollen glands   Psychiatric:  Denies depression or anxiety       Test Results  Imaging: Pending      Physical:   General Appearance: well nourished, Aox3    Vascular: DP/PT 2/4 bilaterally.  CFT is less than 3 seconds to all the toes.  Edema localized to the fifth toe.  Temperature is warm to warm proximal to distal.  Normal turgor noted.    Dermatological:  Right 5th toe medial aspect large ulceration with bone exposed, joint exposed. Joint synovial fluid is coming out through the wound. Malodor noted. Edema and erythema noted to right 5th toe.     Dry, flaky skin noted to plantar aspect of bilateral feet.     Neurological: Protective sensation is diminished bilaterally. Sharp/dull sensation is diminished bilaterally.     MSK: 5/5 strength to the major muscle groups of the bilateral lower extremities.          I personally spent 20 minutes face-to-face and non-face-to-face in the care of this patient, which includes all pre, intra, and post visit time on the date of service.      Scribe's Attestation: Otho Perl, DPM obtained and performed the history, physical exam and medical decision making elements that were entered into the chart. Documentation assistance was provided by me personally, a scribe. Signed by Azalee Course, Scribe, on December 11, 2019 at 7:59 AM.     Documentation assistance provided by the Scribe. I was present during the time the encounter was recorded. The information recorded by the Scribe was done at my direction and has been reviewed and validated by me.

## 2019-12-09 ENCOUNTER — Ambulatory Visit: Admit: 2019-12-09 | Payer: MEDICARE | Attending: Family | Primary: Family

## 2019-12-09 NOTE — Unmapped (Signed)
Karen Barber 's NUCALA shipment will be sent out  as a result of the medication is too soon to refill until 9/22.     I have reached out to the patient and communicated the delivery change. We will reschedule the medication for the delivery date that the patient agreed upon.  We have confirmed the delivery date as 9/22, via same day courier.

## 2019-12-10 DIAGNOSIS — J449 Chronic obstructive pulmonary disease, unspecified: Principal | ICD-10-CM

## 2019-12-10 DIAGNOSIS — D869 Sarcoidosis, unspecified: Principal | ICD-10-CM

## 2019-12-10 DIAGNOSIS — B441 Other pulmonary aspergillosis: Principal | ICD-10-CM

## 2019-12-10 MED ORDER — IPRATROPIUM BROMIDE 0.02 % SOLUTION FOR INHALATION
Freq: Four times a day (QID) | RESPIRATORY_TRACT | 3 refills | 30.00000 days
Start: 2019-12-10 — End: ?

## 2019-12-10 MED ORDER — INSULIN GLARGINE (U-100) 100 UNIT/ML (3 ML) SUBCUTANEOUS PEN
0 refills | 0 days
Start: 2019-12-10 — End: ?

## 2019-12-10 MED ORDER — ALBUTEROL SULFATE HFA 90 MCG/ACTUATION AEROSOL INHALER
RESPIRATORY_TRACT | 3 refills | 0.00000 days | PRN
Start: 2019-12-10 — End: 2020-12-09

## 2019-12-10 MED ORDER — SILDENAFIL (PULMONARY HYPERTENSION) 20 MG TABLET
ORAL_TABLET | Freq: Three times a day (TID) | ORAL | 11 refills | 30.00000 days
Start: 2019-12-10 — End: ?

## 2019-12-10 MED ORDER — POSACONAZOLE 100 MG TABLET,DELAYED RELEASE
ORAL_TABLET | Freq: Every day | ORAL | 2 refills | 30.00000 days | Status: CP
Start: 2019-12-10 — End: ?

## 2019-12-10 MED ORDER — ALBUTEROL SULFATE 2.5 MG/3 ML (0.083 %) SOLUTION FOR NEBULIZATION
Freq: Four times a day (QID) | RESPIRATORY_TRACT | 0 refills | 10.00000 days | PRN
Start: 2019-12-10 — End: ?

## 2019-12-10 NOTE — Unmapped (Signed)
Refill request received for Albuterol inhaler. Dr Tresa Res sent 3 refills in July and patient was a no-show for appointment in September. Patient needs an appt to see Dr. Wellington Hampshire.

## 2019-12-10 NOTE — Unmapped (Signed)
Faxed OV note from 11/14/2019 to [facility] attn Allayne Butcher at [phone] for evidence of continued need for the patients Life 2000 Ventilation System. Fax confirmation received.

## 2019-12-11 ENCOUNTER — Encounter
Admit: 2019-12-11 | Discharge: 2019-12-15 | Disposition: A | Discharge status: Home Health | Payer: MEDICARE | Attending: Student in an Organized Health Care Education/Training Program | Admitting: Student in an Organized Health Care Education/Training Program

## 2019-12-11 ENCOUNTER — Ambulatory Visit
Admit: 2019-12-11 | Discharge: 2019-12-15 | Disposition: A | Discharge status: Home Health | Payer: MEDICARE | Admitting: Student in an Organized Health Care Education/Training Program

## 2019-12-11 DIAGNOSIS — B351 Tinea unguium: Principal | ICD-10-CM

## 2019-12-11 DIAGNOSIS — B353 Tinea pedis: Principal | ICD-10-CM

## 2019-12-11 DIAGNOSIS — L97516 Non-pressure chronic ulcer of other part of right foot with bone involvement without evidence of necrosis: Principal | ICD-10-CM

## 2019-12-11 DIAGNOSIS — M869 Osteomyelitis, unspecified: Principal | ICD-10-CM

## 2019-12-11 LAB — CBC W/ AUTO DIFF
BASOPHILS ABSOLUTE COUNT: 0.1 10*9/L (ref 0.0–0.1)
BASOPHILS RELATIVE PERCENT: 0.9 %
EOSINOPHILS ABSOLUTE COUNT: 0.2 10*9/L (ref 0.0–0.4)
EOSINOPHILS RELATIVE PERCENT: 2.9 %
HEMATOCRIT: 38.6 % (ref 36.0–46.0)
HEMOGLOBIN: 12 g/dL (ref 12.0–16.0)
LYMPHOCYTES ABSOLUTE COUNT: 1.1 10*9/L — ABNORMAL LOW (ref 1.5–5.0)
LYMPHOCYTES RELATIVE PERCENT: 20.5 %
MEAN CORPUSCULAR HEMOGLOBIN: 26.9 pg (ref 26.0–34.0)
MEAN CORPUSCULAR VOLUME: 86.3 fL (ref 80.0–100.0)
MEAN PLATELET VOLUME: 8.9 fL (ref 7.0–10.0)
MONOCYTES RELATIVE PERCENT: 7.2 %
NEUTROPHILS ABSOLUTE COUNT: 3.4 10*9/L (ref 2.0–7.5)
NEUTROPHILS RELATIVE PERCENT: 62.8 %
PLATELET COUNT: 280 10*9/L (ref 150–440)
RED BLOOD CELL COUNT: 4.47 10*12/L (ref 4.00–5.20)
RED CELL DISTRIBUTION WIDTH: 14.3 % (ref 12.0–15.0)
WBC ADJUSTED: 5.4 10*9/L (ref 4.5–11.0)

## 2019-12-11 LAB — COMPREHENSIVE METABOLIC PANEL
ALBUMIN: 3.3 g/dL — ABNORMAL LOW (ref 3.4–5.0)
ALKALINE PHOSPHATASE: 88 U/L (ref 46–116)
ALT (SGPT): 15 U/L (ref 10–49)
ANION GAP: 5 mmol/L (ref 5–14)
AST (SGOT): 16 U/L (ref <=34)
BILIRUBIN TOTAL: 0.5 mg/dL (ref 0.3–1.2)
BLOOD UREA NITROGEN: 15 mg/dL (ref 9–23)
BUN / CREAT RATIO: 11
CALCIUM: 8.8 mg/dL (ref 8.7–10.4)
CO2: 31 mmol/L (ref 20.0–31.0)
CREATININE: 1.34 mg/dL — ABNORMAL HIGH
EGFR CKD-EPI AA FEMALE: 51 mL/min/{1.73_m2} — ABNORMAL LOW (ref >=60)
EGFR CKD-EPI NON-AA FEMALE: 44 mL/min/{1.73_m2} — ABNORMAL LOW (ref >=60)
GLUCOSE RANDOM: 247 mg/dL — ABNORMAL HIGH (ref 70–179)
PROTEIN TOTAL: 7.1 g/dL (ref 5.7–8.2)
SODIUM: 135 mmol/L (ref 135–145)

## 2019-12-11 LAB — HEPARIN CORRELATION: Lab: 0.2

## 2019-12-11 LAB — SLIDE REVIEW

## 2019-12-11 LAB — MEAN CORPUSCULAR HEMOGLOBIN: Erythrocyte mean corpuscular hemoglobin:EntMass:Pt:RBC:Qn:Automated count: 26.9

## 2019-12-11 LAB — PROTIME: Coagulation tissue factor induced:Time:Pt:PPP:Qn:Coag: 11.9

## 2019-12-11 LAB — OVALOCYTES

## 2019-12-11 LAB — AST (SGOT): Aspartate aminotransferase:CCnc:Pt:Ser/Plas:Qn:: 16

## 2019-12-11 MED ORDER — IPRATROPIUM BROMIDE 0.02 % SOLUTION FOR INHALATION
Freq: Four times a day (QID) | RESPIRATORY_TRACT | 1 refills | 90 days | Status: SS
Start: 2019-12-11 — End: 2020-12-10

## 2019-12-11 MED ORDER — SILDENAFIL (PULMONARY HYPERTENSION) 20 MG TABLET
ORAL_TABLET | Freq: Three times a day (TID) | ORAL | 1 refills | 90 days
Start: 2019-12-11 — End: 2020-12-10

## 2019-12-11 MED ORDER — INSULIN GLARGINE (U-100) 100 UNIT/ML (3 ML) SUBCUTANEOUS PEN
Freq: Every day | SUBCUTANEOUS | 1 refills | 100 days | Status: SS
Start: 2019-12-11 — End: 2020-12-10

## 2019-12-11 MED ADMIN — metroNIDAZOLE (FLAGYL) IVPB 500 mg: 500 mg | INTRAVENOUS | @ 23:00:00 | Stop: 2019-12-21

## 2019-12-11 MED ADMIN — insulin lispro (HumaLOG) injection 0-12 Units: 0-12 [IU] | SUBCUTANEOUS | @ 23:00:00

## 2019-12-11 NOTE — Unmapped (Signed)
Pt referred here for admission and prep. Patient is posted for right foot fifth toe amputation surgery tomorrow due to osteomyelitis.

## 2019-12-11 NOTE — Unmapped (Signed)
[facility]  Emergency Department Provider Note    ED Clinical Impression     Final diagnoses:   Osteomyelitis, unspecified site, unspecified type (CMS-HCC) (Primary)       Initial Impression, ED Course, Assessment and Plan     54 year old female, with a past history of sarcoidosis, COPD, asthma, hypertension, type 2 diabetes, presenting to the emergency department referred by podiatry from clinic today for preoperative evaluation of urgent right fifth toe amputation to be done tomorrow morning.  Patient has a history of sarcoidosis but it is stable, and there is no concern for sarcoid flare.  She is not currently on prednisone and states her home blood glucose levels have been better controlled recently.  She is afebrile, and totally at her baseline, there are no infectious concerns.  Patient did not take her morning medications this morning at home, besides her insulin, and will hold off on ordering or restarting any of her home medications at this time, as there are no emergent home medications at this time, and will discuss this with admitting surgical team.  Paged vascular for admission.  Will order CBC, CMP, PT/INR, & aPTT.    5:44 PM: Patient hemodynamically stable and labs are stable.  Patient admitted for scheduled surgery tomorrow    Additional Medical Decision Making     I have reviewed the vital signs and the nursing notes. Labs and radiology results that were available during my care of the patient were independently reviewed by me and considered in my medical decision making.     I staffed the case with the ED attending, Dr. Matilde Haymaker.    I independently visualized the EKG tracing.   I independently visualized the radiology images.   I reviewed the patient's prior medical records  I discussed the case with the admitting provider.     Portions of this record have been created using Scientist, clinical (histocompatibility and immunogenetics). Dictation errors have been sought, but may not have been identified and corrected.  ____________________________________________       History     Chief Complaint  Toe Swelling      HPI   Karen Barber is a 54 y.o. female with history of type 2 diabetes, hypertension, COPD, asthma, and sarcoidosis (on home O2 5L), presenting to the emergency department referred by podiatry for admission for right fifth toe amputation tomorrow for osteomyelitis to right fifth toe.  Patient states she started noticing a right fifth toe ulcer about 5 to 6 months ago, which is progressively worsening and size and effacement of skin.  She went to podiatry clinic this morning, who ordered x-ray of right toe, which concerning for osteomyelitis.  Patient denies fever, chills, shortness of breath besides her normal baseline shortness of breath, nausea, vomiting, abdominal pain, chest pain, dysuria, weakness.  She states she does not feel like she is in a sarcoidosis flare.  She states she is currently not on prednisone, she reports that she took herself off of her current taper about 2 weeks ago.  She reports she found a new pulmonologist, which she just saw yesterday, and she likes this pulmonologist better.  She feels like her sarcoidosis is stable currently.  She states she did not take any of her medications this morning, besides her insulin.      Past Medical History:   Diagnosis Date   ??? 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 Breast injury  lung surg on left incision under breast sept 2016   ??? Caregiver burden     parents    ??? Hepatitis C antibody test positive 2016    repeatedly negative HCV RNA, indicating clearance of infection w/o treatment   ??? Hyperlipidemia    ??? Hypertension    ??? 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 On home O2 2008    per Dr Mal Amabile note from 02/21/2017   ??? Pulmonary aspergillosis (CMS-HCC) 2016    A.fumigatus in 2016, prior to LUL lobectomy. A.niger from sputum 08/2016-09/2016.   ??? 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 Sarcoidosis 1995   ??? Type 2 diabetes mellitus with hyperglycemia (CMS-HCC) 07/27/2014   ??? Visual impairment     glasses       Patient Active Problem List   Diagnosis   ??? Pulmonary sarcoidosis (CMS-HCC)   ??? HTN (hypertension)   ??? Aspergillus fumigatus (CMS-HCC)   ??? Type 2 diabetes mellitus with hyperglycemia (CMS-HCC)   ??? Bronchiectasis type 1 (CMS-HCC)   ??? Cavitary lesion of lung   ??? Invasive pulmonary aspergillosis (CMS-HCC)   ??? COPD (chronic obstructive pulmonary disease) (CMS-HCC)   ??? Dyspnea on exertion   ??? Hypoxia   ??? Chronic pulmonary aspergillosis (CMS-HCC)   ??? Eosinophilic asthma   ??? 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 Eosinophilia   ??? DOE (dyspnea on exertion)   ??? Osteomyelitis of ankle and foot (CMS-HCC)       Past Surgical History:   Procedure Laterality Date   ??? LUNG LOBECTOMY     ??? PR RIGHT HEART CATH O2 SATURATION & CARDIAC OUTPUT N/A 04/29/2019    Procedure: Right Heart Catheterization;  Surgeon: Rosana Hoes, MD;  Location: [facility] CATH;  Service: Cardiology   ??? PR THORACOSCOPY SURG TOT PULM DECORT Left 12/14/2014    Procedure: THORACOSCOPY SURG; W/TOT PULM DECORTIC/PNEUMOLYS;  Surgeon: Evert Kohl, MD;  Location: MAIN OR [facility];  Service: Cardiothoracic   ??? PR THORACOSCOPY W/THERA WEDGE RESEXN INITIAL UNILAT Left 12/03/2014    Procedure: THORACOSCOPY, SURGICAL; WITH THERAPEUTIC WEDGE RESECTION (EG, MASS, NODULE) INITIAL UNILATERAL;  Surgeon: Evert Kohl, MD;  Location: MAIN OR [facility];  Service: Cardiothoracic         Current Facility-Administered Medications:   ???  acetaminophen (TYLENOL) tablet 650 mg, 650 mg, Oral, Q4H PRN, Salley Slaughter, MD  ???  cefepime (MAXIPIME) 2 g in dextrose 100 mL IVPB (premix), 2 g, Intravenous, Q12H, Salley Slaughter, MD  ???  dextrose 50 % in water (D50W) 50 % solution 12.5 g, 12.5 g, Intravenous, Q10 Min PRN, Salley Slaughter, MD  ???  gabapentin (NEURONTIN) capsule 300 mg, 300 mg, Oral, TID, Salley Slaughter, MD  ???  insulin lispro (HumaLOG) injection 0-12 Units, 0-12 Units, Subcutaneous, ACHS, Salley Slaughter, MD  ???  ipratropium-albuteroL (DUO-NEB) 0.5-2.5 mg/3 mL nebulizer solution 3 mL, 3 mL, Nebulization, Q6H (RT), Salley Slaughter, MD  ???  metroNIDAZOLE (FLAGYL) IVPB 500 mg, 500 mg, Intravenous, Q8H, Salley Slaughter, MD  ???  pravastatin (PRAVACHOL) tablet 40 mg, 40 mg, Oral, Daily, Salley Slaughter, MD  ???  sildenafiL (pulm.hypertension) (REVATIO) tablet 20 mg, 20 mg, Oral, TID, Salley Slaughter, MD  ???  sodium chloride 3 % nebulizer solution 4 mL, 4 mL, Nebulization, Once, Melanee Left, MD  ???  vancomycin (VANCOCIN) 1500 mg in sodium chloride (NS) 0.9 %  500 mL IVPB (premix), 1,500 mg, Intravenous, Q8H, Salley Slaughter, MD    Current Outpatient Medications:   ???  ACCU-CHEK FASTCLIX LANCET DRUM Misc, USE TO CHECK GLUCOSE 4 TIMES DAILY BEFORE MEAL(S) AND NIGHTLY, Disp: , Rfl:   ???  ACCU-CHEK GUIDE GLUCOSE METER Misc, 1 Device by Other route Four (4) times a day (before meals and nightly). AS DIRECTED, Disp: 1 kit, Rfl: 0  ???  albuterol 2.5 mg /3 mL (0.083 %) nebulizer solution, Inhale 3 mL (2.5 mg total) by nebulization every six (6) hours as needed for wheezing or shortness of breath (airway clearance/cough)., Disp: 120 mL, Rfl: 0  ???  albuterol HFA 90 mcg/actuation inhaler, Inhale 2 puffs every four (4) hours as needed for wheezing or shortness of breath., Disp: 8.5 g, Rfl: 3  ???  blood sugar diagnostic Strp, by Other route Three (3) times a day., Disp: 200 strip, Rfl: 2  ???  budesonide-formoteroL (SYMBICORT) 160-4.5 mcg/actuation inhaler, Inhale 2 puffs Two (2) times a day., Disp: 10.2 g, Rfl: 3  ???  cyclobenzaprine (FLEXERIL) 10 MG tablet, Take 1 tablet (10 mg total) by mouth Three (3) times a day as needed., Disp: 90 tablet, Rfl: 0  ???  empty container Misc, USE AS DIRECTED, Disp: 1 each, Rfl: 2  ???  EPINEPHrine (EPIPEN 2-PAK) 0.3 mg/0.3 mL injection, Inject 0.3 mL (0.3 mg total) into the muscle once for 1 dose., Disp: 1 Device, Rfl: 1  ???  gabapentin (NEURONTIN) 300 MG capsule, Take 1 capsule (300 mg total) by mouth Three (3) times a day., Disp: 270 capsule, Rfl: 1  ???  hydroCHLOROthiazide (HYDRODIURIL) 25 MG tablet, Take 1 tablet (25 mg total) by mouth daily. TAKE ONE (1) TABLET BY MOUTH EVERY DAY, Disp: 90 tablet, Rfl: 1  ???  insulin ASPART (NOVOLOG U-100 INSULIN ASPART) 100 unit/mL injection, Take 1-20 units SQ QID AC and HS as directed, Disp: 80 mL, Rfl: 1  ???  insulin glargine (BASAGLAR, LANTUS) 100 unit/mL (3 mL) injection pen, Inject 0.18 mL (18 Units total) under the skin daily. 18 units, Disp: 18 mL, Rfl: 1  ???  ipratropium (ATROVENT) 0.02 % nebulizer solution, Inhale 2.5 mL (500 mcg total) by nebulization Four (4) times a day., Disp: 900 mL, Rfl: 1  ???  ketoconazole (NIZORAL) 2 % cream, Apply 1 application topically Two (2) times a day. Use for 2-3 weeks., Disp: 30 g, Rfl: 11  ???  ketoconazole (NIZORAL) 2 % cream, Apply 1 application topically Two (2) times a day., Disp: 60 g, Rfl: 5  ???  MEDICAL SUPPLY ITEM, Accucheck Glide Glocumeter for home glucose checks. Check blood glucose ACHS., Disp: 1 Package, Rfl: 0  ???  mepolizumab (NUCALA) 100 mg/mL AtIn, Inject the contents of 1 syringe (100 mg) under the skin every twenty-eight (28) days., Disp: 1 mL, Rfl: 12  ???  metFORMIN (GLUCOPHAGE) 1000 MG tablet, Take 1 tablet (1,000 mg total) by mouth 2 (two) times a day with meals., Disp: 180 tablet, Rfl: 1  ???  miscellaneous medical supply Misc, Nebulizer machine and tubing. Patients machine is 56 years old., Disp: 1 each, Rfl: 0  ???  mupirocin (BACTROBAN) 2 % ointment, Apply topically Two (2) times a day for 7 days., Disp: 30 g, Rfl: 1  ???  OXYGEN-AIR DELIVERY SYSTEMS MISC, Dose: 2-3 LPM, Disp: , Rfl:   ???  posaconazole (NOXAFIL) 100 mg TbEC delayed released tablet, Take 200 mg by mouth daily., Disp: 60 tablet, Rfl: 2  ???  pravastatin (  PRAVACHOL) 40 MG tablet, Take 1 tablet (40 mg total) by mouth daily with evening meal., Disp: 90 tablet, Rfl: 1  ???  predniSONE (DELTASONE) 10 MG tablet, Take 1 tablet (10 mg total) by mouth daily. Take 1 tablet daily (10 mg) daily until seen by Dr. Wellington Hampshire (Patient not taking: Reported on 12/11/2019), Disp: 30 tablet, Rfl: 0  ???  sildenafiL, pulm.hypertension, (REVATIO) 20 mg tablet, Take 1 tablet (20 mg total) by mouth Three (3) times a day., Disp: 90 tablet, Rfl: 11    Allergies  Patient has no known allergies.    Family History   Problem Relation Age of Onset   ??? Diabetes Father    ??? Diabetes Brother    ??? Diabetes Maternal Aunt    ??? Sarcoidosis Maternal Aunt    ??? Diabetes Maternal Uncle    ??? Diabetes Paternal Aunt    ??? Diabetes Paternal Uncle        Social History  Social History     Tobacco Use   ??? Smoking status: Former Smoker     Packs/day: 0.50     Years: 9.00     Pack years: 4.50     Types: Cigarettes     Quit date: 05/03/1987     Years since quitting: 32.6   ??? Smokeless tobacco: Never Used   Vaping Use   ??? Vaping Use: Never used   Substance Use Topics   ??? Alcohol use: No     Alcohol/week: 0.0 standard drinks   ??? Drug use: No       Review of Systems  Constitutional: Negative for fever or chills. Negative for fatigue or weakness  Eyes: Negative for visual changes.  ENT: Negative for sore throat.  Cardiovascular: Negative for chest pain.  Respiratory: Positive for baseline dyspnea.  No increased dyspnea.  No cough  Gastrointestinal: Negative for abdominal pain, nausea, vomiting or diarrhea.  Genitourinary: Negative for dysuria.  Musculoskeletal: Negative for back pain. Positive for right fifth toe ulcer  Skin: Negative for rash.  Neurological: Negative for headaches, focal weakness or numbness.    Physical Exam     ED Triage Vitals [12/11/19 1418]   Enc Vitals Group      BP 130/90      Heart Rate 110      SpO2 Pulse       Resp 22      Temp 36.8 ??C (98.2 ??F)      Temp src       SpO2 93 %      Weight       Height       Head Circumference       Peak Flow Pain Score       Pain Loc       Pain Edu?       Excl. in GC?        Constitutional: Alert and oriented. Well appearing and in no distress.  Resting comfortably in bed  Eyes: Conjunctivae are normal.  ENT       Head: Normocephalic and atraumatic.       Nose: No congestion.       Mouth/Throat: Mucous membranes are moist.       Neck: No stridor.  Hematological/Lymphatic/Immunilogical: No cervical lymphadenopathy.  Cardiovascular: Normal rate, regular rhythm. Normal and symmetric distal pulses are present in all extremities.  No lower extremity edema  Respiratory: Normal respiratory effort. Breath sounds are normal.  Gastrointestinal: Soft and nontender. There is no CVA  tenderness.  No rebound or guarding  Genitourinary: Deferred  Musculoskeletal: Normal range of motion in all extremities.       Right lower leg: No tenderness or edema.  There is a large right fifth toe medial diabetic ulcer with involvement of entire right fifth toe.  There is no extending erythema, drainage, or tenderness to palpation.  There is no proximal involvement proximal to the fifth metatarsophalangeal joint.       Left lower leg: No tenderness or edema.  Neurologic: Normal speech and language. No gross focal neurologic deficits are appreciated.  Skin: Skin is warm, dry and intact. No rash noted.  Psychiatric: Mood and affect are normal. Speech and behavior are normal.      EKG     ECG 12 Lead    Result Date: 12/11/2019  SINUS TACHYCARDIA WITH 1ST DEGREE AV BLOCK PULMONARY DISEASE PATTERN LEFT ANTERIOR FASCICULAR BLOCK NONSPECIFIC T WAVE ABNORMALITY ABNORMAL ECG WHEN COMPARED WITH ECG OF 29-Apr-2019 10:27, NONSPECIFIC T WAVE ABNORMALITY NOW EVIDENT IN ANTEROLATERAL LEADS    Radiology   Earlier today:  ECG 12 Lead    Result Date: 12/11/2019  SINUS TACHYCARDIA WITH 1ST DEGREE AV BLOCK PULMONARY DISEASE PATTERN LEFT ANTERIOR FASCICULAR BLOCK NONSPECIFIC T WAVE ABNORMALITY ABNORMAL ECG WHEN COMPARED WITH ECG OF 29-Apr-2019 10:27, NONSPECIFIC T WAVE ABNORMALITY NOW EVIDENT IN ANTEROLATERAL LEADS    XR Foot 3 Or More Views Right    Result Date: 12/09/2019  EXAM: XR FOOT 3 OR MORE VIEWS RIGHT DATE: 12/09/2019 1:50 PM ACCESSION: [id number] UN DICTATED: 12/09/2019 2:03 PM INTERPRETATION LOCATION: [facility]     CLINICAL INDICATION: 54 years old Female with 5th toe ulcer  - E11.621 - Diabetic ulcer of toe of right foot associated with type 2 diabetes mellitus, limited to breakdown of skin (CMS - HCC) - L97.511 - Diabetic ulcer of toe of right foot associated with type 2 diabetes mellitus, limited to breakdown      COMPARISON: None.     TECHNIQUE: Dorsoplantar, oblique and lateral views of the right foot.     FINDINGS: No acute fractures. The joints are approximated. There are small erosions are present along the dorsal, distal aspect of the right little toe proximal phalanx, with overlying periosteal bone formation. Periosteal bone formation is also visualized along the distal aspect of the little toe distal phalanx. There is moderate soft tissue swelling about the fifth phalanx. There is no dissecting soft tissue gas. Punctate radiodensity along the medial aspect of the fourth toe proximal phalanx, likely representing a retained foreign body.         Erosions and periosteal reaction with overlying soft tissue swelling of the right fifth phalanx as described above, concerning for active osteomyelitis. No evidence of gas-forming infection.    No radiology in the ED    Procedures   N/A       Ewing Schlein, MD  Resident  12/11/19 [phone]

## 2019-12-11 NOTE — Unmapped (Addendum)
Please go to the [facility] Emergency Department today.

## 2019-12-11 NOTE — Unmapped (Signed)
Karen Barber 's Nucala shipment will be delayed due to Insufficient inventory We have contacted the patient and communicated the delivery change to patient/caregiver We will reschedule the medication for the delivery date that the patient agreed upon. We have confirmed the delivery date as 12/15/19. Pt will be at the hospital today til Saturday and is okay with Monday delivery via [facility].

## 2019-12-12 LAB — MEAN CORPUSCULAR VOLUME: Erythrocyte mean corpuscular volume:EntVol:Pt:RBC:Qn:Automated count: 85.5

## 2019-12-12 LAB — CBC
HEMATOCRIT: 37 % (ref 36.0–46.0)
HEMOGLOBIN: 11.4 g/dL — ABNORMAL LOW (ref 12.0–16.0)
MEAN CORPUSCULAR HEMOGLOBIN CONC: 30.9 g/dL — ABNORMAL LOW (ref 31.0–37.0)
MEAN CORPUSCULAR HEMOGLOBIN: 26.4 pg (ref 26.0–34.0)
MEAN CORPUSCULAR VOLUME: 85.5 fL (ref 80.0–100.0)
MEAN PLATELET VOLUME: 9.2 fL (ref 7.0–10.0)
PLATELET COUNT: 324 10*9/L (ref 150–440)
RED BLOOD CELL COUNT: 4.33 10*12/L (ref 4.00–5.20)
RED CELL DISTRIBUTION WIDTH: 14.3 % (ref 12.0–15.0)

## 2019-12-12 LAB — BASIC METABOLIC PANEL
ANION GAP: 7 mmol/L (ref 5–14)
BLOOD UREA NITROGEN: 17 mg/dL (ref 9–23)
BUN / CREAT RATIO: 13
CALCIUM: 9 mg/dL (ref 8.7–10.4)
CHLORIDE: 101 mmol/L (ref 98–107)
CO2: 28 mmol/L (ref 20.0–31.0)
CREATININE: 1.28 mg/dL — ABNORMAL HIGH
EGFR CKD-EPI AA FEMALE: 54 mL/min/{1.73_m2} — ABNORMAL LOW (ref >=60)
GLUCOSE RANDOM: 238 mg/dL — ABNORMAL HIGH (ref 70–179)
POTASSIUM: 4.3 mmol/L (ref 3.4–4.5)
SODIUM: 136 mmol/L (ref 135–145)

## 2019-12-12 LAB — ESTIMATED AVERAGE GLUCOSE: Estimated average glucose:MCnc:Pt:Bld:Qn:Estimated from glycated hemoglobin: 243

## 2019-12-12 LAB — MAGNESIUM: Magnesium:MCnc:Pt:Ser/Plas:Qn:: 1.6

## 2019-12-12 LAB — PHOSPHORUS
PHOSPHORUS: 4.1 mg/dL (ref 2.4–5.1)
Phosphate:MCnc:Pt:Ser/Plas:Qn:: 4.1

## 2019-12-12 LAB — HEMOGLOBIN A1C: HEMOGLOBIN A1C: 10.1 % — ABNORMAL HIGH (ref 4.8–5.6)

## 2019-12-12 LAB — POTASSIUM: Potassium:SCnc:Pt:Ser/Plas:Qn:: 4.3

## 2019-12-12 MED ADMIN — cefepime (MAXIPIME) 2 g in dextrose 100 mL IVPB (premix): 2 g | INTRAVENOUS | @ 02:00:00 | Stop: 2019-12-16

## 2019-12-12 MED ADMIN — insulin lispro (HumaLOG) injection 0-12 Units: 0-12 [IU] | SUBCUTANEOUS | @ 20:00:00

## 2019-12-12 MED ADMIN — lactated Ringers infusion: INTRAVENOUS | @ 18:00:00 | Stop: 2019-12-12

## 2019-12-12 MED ADMIN — pravastatin (PRAVACHOL) tablet 40 mg: 40 mg | ORAL | @ 22:00:00

## 2019-12-12 MED ADMIN — insulin lispro (HumaLOG) injection 0-12 Units: 0-12 [IU] | SUBCUTANEOUS | @ 02:00:00

## 2019-12-12 MED ADMIN — gabapentin (NEURONTIN) capsule 300 mg: 300 mg | ORAL | @ 14:00:00

## 2019-12-12 MED ADMIN — bupivacaine (PF) (MARCAINE) 0.5 % (5 mg/mL) injection (PF): @ 18:00:00 | Stop: 2019-12-12

## 2019-12-12 MED ADMIN — lidocaine (XYLOCAINE) 10 mg/mL (1 %) injection: @ 18:00:00 | Stop: 2019-12-12

## 2019-12-12 MED ADMIN — vancomycin (VANCOCIN) 2,250 mg in sodium chloride (NS) 0.9 % 500 mL IVPB: 2250 mg | INTRAVENOUS | @ 03:00:00

## 2019-12-12 MED ADMIN — ipratropium-albuteroL (DUO-NEB) 0.5-2.5 mg/3 mL nebulizer solution 3 mL: 3 mL | RESPIRATORY_TRACT | @ 14:00:00

## 2019-12-12 MED ADMIN — metroNIDAZOLE (FLAGYL) IVPB 500 mg: 500 mg | INTRAVENOUS | @ 22:00:00 | Stop: 2019-12-21

## 2019-12-12 MED ADMIN — sildenafiL (pulm.hypertension) (REVATIO) tablet 10 mg: 10 mg | ORAL | @ 02:00:00

## 2019-12-12 MED ADMIN — ipratropium-albuteroL (DUO-NEB) 0.5-2.5 mg/3 mL nebulizer solution 3 mL: 3 mL | RESPIRATORY_TRACT | @ 08:00:00

## 2019-12-12 MED ADMIN — acetaminophen (TYLENOL) tablet 1,000 mg: 1000 mg | ORAL | @ 17:00:00 | Stop: 2019-12-12

## 2019-12-12 MED ADMIN — metroNIDAZOLE (FLAGYL) IVPB 500 mg: 500 mg | INTRAVENOUS | @ 06:00:00 | Stop: 2019-12-21

## 2019-12-12 MED ADMIN — matrix hemostatic sealant (FLOSEAL) topical: TOPICAL | @ 18:00:00 | Stop: 2019-12-12

## 2019-12-12 MED ADMIN — gabapentin (NEURONTIN) capsule 300 mg: 300 mg | ORAL | @ 20:00:00

## 2019-12-12 MED ADMIN — insulin lispro (HumaLOG) injection 0-12 Units: 0-12 [IU] | SUBCUTANEOUS | @ 16:00:00

## 2019-12-12 MED ADMIN — insulin lispro (HumaLOG) injection 7 Units: 7 [IU] | SUBCUTANEOUS | @ 20:00:00

## 2019-12-12 MED ADMIN — metroNIDAZOLE (FLAGYL) IVPB 500 mg: 500 mg | INTRAVENOUS | @ 15:00:00 | Stop: 2019-12-21

## 2019-12-12 MED ADMIN — insulin NPH (HumuLIN,NovoLIN) injection 10 Units: 10 [IU] | SUBCUTANEOUS | @ 14:00:00

## 2019-12-12 NOTE — Unmapped (Signed)
Endocrine Team Diabetes New Consult Note       Requesting Attending Physician : Deneise Lever, MD  Service Requesting Consult : Surg Vascular (SRV)  Primary Care Provider: Noralyn Pick, FNP    Assessment and Plan:  IMPRESSION:  Karen Barber is a 54 y.o. female admitted for right fifth toe amputation. We have been consulted at the request of Deneise Lever, MD to evaluate Karen Barber for hyperglycemia.       RECOMMENDATIONS:  1. Type 2 diabetes, uncontrolled: A1C 9.9%. Blood sugars elevated to mid 200s upon arrival to ED yesterday. Blood sugars did not trend down with correctional insulin overnight. Recommend starting on 0.4 units/kg weight based regimen and splitting equally into basal and nutritional coverage.  - NPH 10 units q12h  - Lispro 7 units with meals   - hold if NPO. Adjust for PO intake.  - Lispro 2:50>150 achs    Other problems complicating glycemic control:  Variable PO intake and sarcoidosis and chronic prednisone use  Active Problems:    * No active hospital problems. *    2. Nutrition: complicating glycemic control.   - currently NPO for surgery    Thank you for this consult. Communicated plan to primary team. We will continue to follow and make recommendations and place orders as appropriate.    Please page with questions or concerns: [place], [place]: [phone]  DCT on call from 6AM - 3PM on weekdays then endocrine fellow on call: [phone] from 3PM - 6AM on weekdays and on weekends and holidays.   If APP cannot be reached, please page the endocrine fellow on call.      Subjective:  Initial encounter HPI:  Karen Barber is a 54 y.o. female with pertinent past medical history of sarcoidosis, COPD, asthma, hypertension, and type 2 diabetes admitted for urgent right fifth toe amputation.  Patient has a history of sarcoidosis but it is stable, and there is no concern for sarcoid flare.  She is not currently on prednisone and states her home blood glucose levels have been better controlled recently. She stopped taking prednisone about 3 weeks ago. When taking prednisone her blood sugars were frequently in the 500s. Since stopping prednisone her blood sugars have been mostly in the 200s.     Diabetes History:  Patient has a history of Type 2 diabetes diagnosed several years ago when she started taking prednisone.  Diabetes is managed by: PCP.  Current home diabetes regimen:metformin 1000 mg BID, glargine 24 units nightly (titrating up by 3 units daily until she gets to 30 units total) and novolog sliding scale.  Current home blood glucose monitoring:  daily.  Hypoglycemia awareness: yes.  Complications related to diabetes: unknown     Patient does not like hospital food so only nibbled yesterday. Denies nausea and vomiting. She did not take any long acting insulin yesterday.     Current Diabetes Inpatient Regimen:  - lispro 2:50>150 achs    Current Nutrition:  Active Orders   Diet    NPO Sips with meds; Operating room       ROS: As per HPI, otherwise 10 system ROS was negative.    ??? Cefepime  2 g Intravenous Q12H   ??? gabapentin  300 mg Oral TID   ??? insulin lispro  0-12 Units Subcutaneous ACHS   ??? ipratropium-albuteroL  3 mL Nebulization Q6H (RT)   ??? metronidazole  500 mg Intravenous Q8H   ??? pravastatin  40 mg Oral Daily   ???  sildenafiL (pulm.hypertension)  10 mg Oral TID   ??? vancomycin  1,000 mg Intravenous Q12H [facility]       Past Medical History:   Diagnosis Date   ??? 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 Breast injury     lung surg on left incision under breast sept 2016   ??? Caregiver burden     parents    ??? Hepatitis C antibody test positive 2016    repeatedly negative HCV RNA, indicating clearance of infection w/o treatment   ??? Hyperlipidemia    ??? Hypertension    ??? 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 On home O2 2008    per Dr Mal Amabile note from 02/21/2017   ??? Pulmonary aspergillosis (CMS-HCC) 2016    A.fumigatus in 2016, prior to LUL lobectomy. A.niger from sputum 08/2016-09/2016.   ??? 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 Sarcoidosis 1995   ??? Type 2 diabetes mellitus with hyperglycemia (CMS-HCC) 07/27/2014   ??? Visual impairment     glasses       Past Surgical History:   Procedure Laterality Date   ??? LUNG LOBECTOMY     ??? PR RIGHT HEART CATH O2 SATURATION & CARDIAC OUTPUT N/A 04/29/2019    Procedure: Right Heart Catheterization;  Surgeon: Rosana Hoes, MD;  Location: [facility] CATH;  Service: Cardiology   ??? PR THORACOSCOPY SURG TOT PULM DECORT Left 12/14/2014    Procedure: THORACOSCOPY SURG; W/TOT PULM DECORTIC/PNEUMOLYS;  Surgeon: Evert Kohl, MD;  Location: MAIN OR [facility];  Service: Cardiothoracic   ??? PR THORACOSCOPY W/THERA WEDGE RESEXN INITIAL UNILAT Left 12/03/2014    Procedure: THORACOSCOPY, SURGICAL; WITH THERAPEUTIC WEDGE RESECTION (EG, MASS, NODULE) INITIAL UNILATERAL;  Surgeon: Evert Kohl, MD;  Location: MAIN [facility];  Service: Cardiothoracic       Family History   Problem Relation Age of Onset   ??? Diabetes Father    ??? Diabetes Brother    ??? Diabetes Maternal Aunt    ??? Sarcoidosis Maternal Aunt    ??? Diabetes Maternal Uncle    ??? Diabetes Paternal Aunt    ??? Diabetes Paternal Uncle        Social History     Tobacco Use   ??? Smoking status: Former Smoker     Packs/day: 0.50     Years: 9.00     Pack years: 4.50     Types: Cigarettes     Quit date: 05/03/1987     Years since quitting: 32.6   ??? Smokeless tobacco: Never Used   Vaping Use   ??? Vaping Use: Never used   Substance Use Topics   ??? Alcohol use: No     Alcohol/week: 0.0 standard drinks   ??? Drug use: No       OBJECTIVE:  BP 138/78  - Pulse 96  - Temp 36.4 ??C (97.5 ??F) (Oral)  - Resp 18  - Wt (!) 109.6 kg (241 lb 11.2 oz)  - LMP  (LMP Unknown)  - SpO2 95%  - BMI 33.73 kg/m??   Wt Readings from Last 12 Encounters:   12/11/19 (!) 109.6 kg (241 lb 11.2 oz)   11/11/19 (!) 109.3 kg (241 lb)   08/22/19 (!) 104.3 kg (230 lb)   08/07/19 100.2 kg (221 lb)   05/30/19 (!) 107.6 kg (237 lb 4.8 oz)   05/29/19 Marland Kitchen)  109.8 kg (242 lb)   05/13/19 (!) 102.1 kg (225 lb)   04/29/19 (!) 107 kg (236 lb)   01/24/19 (!) 103.4 kg (228 lb)   01/08/19 (!) 103.4 kg (228 lb)   11/18/18 95.3 kg (210 lb)   11/01/18 99.8 kg (220 lb)     Physical Exam  Vitals reviewed.   Constitutional:       General: She is not in acute distress.     Comments: Sitting up on edge of bed, family at bedside   HENT:      Head: Normocephalic.      Nose: Nose normal.   Eyes:      Extraocular Movements: Extraocular movements intact.      Conjunctiva/sclera: Conjunctivae normal.   Cardiovascular:      Rate and Rhythm: Normal rate and regular rhythm.      Pulses: Normal pulses.   Pulmonary:      Effort: No respiratory distress.      Breath sounds: No wheezing.      Comments: Supplemental oxygen via nasal cannula  Abdominal:      Palpations: Abdomen is soft.      Tenderness: There is no abdominal tenderness. There is no guarding.   Musculoskeletal:      Cervical back: Normal range of motion.   Skin:     General: Skin is warm and dry.   Neurological:      Mental Status: She is alert and oriented to person, place, and time.   Psychiatric:         Attention and Perception: Attention normal.         Behavior: Behavior is cooperative.       Data Review    BG/insulin reviewed per EMR.   Glucose, POC (mg/dL)   Date Value   16/12/9602 247 (H)   12/11/2019 245 (H)   12/11/2019 246 (H)   12/09/2018 177   02/18/2018 317 (H)   02/18/2018 141   02/18/2018 174   02/17/2018 295 (H)   07/03/2014 161   07/03/2014 161   07/02/2014 243 (H)   07/02/2014 131   07/02/2014 202 (H)   07/02/2014 123   07/01/2014 251 (H)   07/01/2014 207 (H)        Summary of labs:  Lab Results   Component Value Date    A1C 9.9 (A) 11/11/2019    A1C 9.7 (A) 05/29/2019    A1C 9.8 (H) 11/01/2018     Lab Results   Component Value Date    GFR >= 60 05/31/2011    CREATININE 1.34 (H) 12/11/2019     Lab Results Component Value Date    WBC 5.4 12/11/2019    HGB 12.0 12/11/2019    HCT 38.6 12/11/2019    PLT 280 12/11/2019       Lab Results   Component Value Date    NA 135 12/11/2019    K 4.1 12/11/2019    CL 99 12/11/2019    CO2 31.0 12/11/2019    BUN 15 12/11/2019    CREATININE 1.34 (H) 12/11/2019    GLU 247 (H) 12/11/2019    CALCIUM 8.8 12/11/2019    MG 1.5 (L) 08/28/2018    PHOS 4.3 08/28/2018       Lab Results   Component Value Date    BILITOT 0.5 12/11/2019    BILIDIR <0.10 05/30/2019    PROT 7.1 12/11/2019    ALBUMIN 3.3 (L) 12/11/2019    ALT 15 12/11/2019  AST 16 12/11/2019    ALKPHOS 88 12/11/2019    GGT 60 (H) 05/31/2011       Lab Results   Component Value Date    INR 1.02 12/11/2019    APTT 30.9 12/11/2019

## 2019-12-12 NOTE — Unmapped (Signed)
Date of Surgery: 12/12/2019    Pre-op Diagnosis: Right foot fifth toe osteomyelitis, open wound nonhealing    Post-op Diagnosis: Same    Procedure(s):  Right foot fifth toe amputation at the metatarsophalangeal joint level: 28820 (CPT??)  Note: Revisions to procedures should be made in chart - see Procedures activity.    Performing Service: Vascular  Surgeon(s) and Role:     * Karen Barber, DPM - Primary    Assistant: None    Findings: Clean margins achieved at fifth metatarsophalangeal joint.    Anesthesia: Monitor Anesthesia Care    Estimated Blood Loss: 20 mL    Complications: None    Specimens:   ID Type Source Tests Collected by Time Destination   1 : RIGHT FOOT 5TH TOE Amputation Foot, Right SURGICAL PATHOLOGY EXAM Karen Barber, DPM 12/12/2019 1415    A : CLEAN BONE CULTURE RIGHT 5TH TOE Bone Foot, Right AEROBIC/ANAEROBIC CULTURE, FUNGAL CULTURE, AFB CULTURE Karen Barber, DPM 12/12/2019 1412    B : RIGHT FOOT SOFT TISSUE CULTURE Tissue Foot, Right AEROBIC/ANAEROBIC CULTURE, FUNGAL CULTURE, AFB CULTURE Karen Barber, DPM 12/12/2019 1413        Implants: * No implants in log *    Surgeon Notes: I performed the procedure    Indication: Patient presented with right foot fifth toe osteomyelitis, open wound nonhealing. Once all the conservative therapy failed, patient opted for surgical intervention. Patient was consulted on the goals and expectations of the surgery as well as risks and complications. Some of the risks discussed with the patient included pain, burning, bleeding, infection, swelling, blood clots, delayed healing, non-healing, may require further surgeries, loss of function, loss of limb or loss of life. All questions were answered and no guarantees were given or implied. Patient understood and agreed. Written consent was signed, witnessed and placed in patient's chart.     Procedure description: Patient was seen and evaluated in the pre-operative area. Consent was reviewed. All questions were answered. Patient right leg was marked. Patient was then transferred from pre-operative area to OR and was placed on the OR table in supine position. Pre-anesthesia timeout was performed. Anesthesia was then inducted. Patient's right lower extremity was then scrubbed prepped and draped in the usual aseptic manner. Pre-incision time out was performed.     At this time, attention was then directed to right foot where 10 cc of 0.25% Marcaine plain and 1% lidocaine plain was injected around the operative site with one-to-one mixture.  Once local anesthesia was induced, attention was then directed to right foot fifth metatarsophalangeal joint where modified racquet shaped incision was created around fifth metatarsal phalangeal joint.  Incision was dissected layer by layer until fifth metatarsophalangeal joint was identified.  Fifth toe was resected at the metatarsophalangeal joint level.  Fifth toe proximal phalanx base culture was taken and sent to microbiology as clean bone culture.  We took soft tissue culture from the wound area and sent to microbiology as well.  The remaining fifth toe was sent to pathology for evaluation of osteomyelitis.  We examined the fifth metatarsal head and it was noted to have healthy white cartilage without any signs of infection.  Healthy perfusion noted to the fifth metatarsophalangeal joint level as well.  We irrigated the entire wound using normal saline solution.  The entire surgical team changed gloves.  5 cc of FloSeal and compression was applied and hemostasis was achieved.  The wound was reapproximated with 3-0 Prolene.  We applied Adaptic  soaked in Betadine, gauze, Kerlix and Ace to the right lower extremity.  Patient tolerated the procedure well and was transported from over to her room with vital signs stable and vascular status intact as noted in the OR.

## 2019-12-12 NOTE — Unmapped (Signed)
Vascular Surgery History and Physical    Patient Name: Karen Barber  Medical Record Number: [id number]  Date of Service: December 11, 2019   Teaching Physician: Bobby Rumpf, MD    Current complaint: Right 5th toe wound with evidence of osteomyelitis    Assessment:    Karen Barber is a 54 y.o. female with history of sarcoidosis, aspergillus pneumonia, left upper lobectomy in type II DM who was seen at podiatry clinic for a right fifth toe wound.  The patient presented to clinic today and was found to have a worsening soft tissue wound in the right fifth toe with x-ray evidence of osteomyelitis.  She has no history of vascular disease.  The patient is hemodynamically stable, afebrile, without leukocytosis.  She will be admitted to the vascular service for planned right fifth toe amputation by podiatry on 9/24.  We will do a complete preoperative work-up.     Plan:   ??? Admit to SRV  ??? Planned right fifth toe amputation by podiatry on 9/24  ??? Start vanc/cefepime/flagyl  ??? CBC, BMP, EKG, chest x-ray  ??? Diabetic diet, n.p.o. midnight  ??? ISS  ??? Duo nebs, aggressive pulmonary toilet, titrate O2 to maintain SPO2 greater than 92%  ??? Gabapentin, as needed Tylenol  ??? Dispo: Floor      History of Present Illness:   ??  Karen Barber is a 54 y.o. female with history of sarcoidosis, aspergillus pneumonia, left upper lobectomy in type II DM who was seen at podiatry clinic for a right fifth toe wound.  Right foot XR from 12/09/19 reviewed in detail showing right fifth phalanx osteomyelitis.  The patient's wound started as a small scab 3 to 4 months ago.  She attributed this to prednisone treatment and high blood glucose.  Patient treated with topical antibiotics.  The wound worsened and she was sent to podiatry by her PCP.  A local debridement was performed by podiatry.  The patient returned today and podiatry found a large ulceration with bone and joint exposed, and foul-smelling discharge.  She was sent to the ED by podiatry for planned IV antibiotics and admission.     She denies any toe pain.  She denies fever, chills, nausea, vomiting or any symptoms of systemic infection.  The patient has had a past lobectomy due to a complication secondary to sarcoidosis and Aspergillus infection.  She is on home oxygen denies being short of breath or having difficulty breathing.  The patient does not smoke.  She has no history of vascular disease.  She has no other relevant symptoms    Past Medical History:  Past Medical History:   Diagnosis Date   ??? 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 Breast injury     lung surg on left incision under breast sept 2016   ??? Caregiver burden     parents    ??? Hepatitis C antibody test positive 2016    repeatedly negative HCV RNA, indicating clearance of infection w/o treatment   ??? Hyperlipidemia    ??? Hypertension    ??? 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 On home O2 2008    per Dr Mal Amabile note from 02/21/2017   ??? Pulmonary aspergillosis (CMS-HCC) 2016    A.fumigatus in 2016, prior to LUL lobectomy. A.niger from sputum 08/2016-09/2016.   ??? 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  Sarcoidosis 1995   ??? Type 2 diabetes mellitus with hyperglycemia (CMS-HCC) 07/27/2014   ??? Visual impairment     glasses       Past Surgical History:   Past Surgical History:   Procedure Laterality Date   ??? LUNG LOBECTOMY     ??? PR RIGHT HEART CATH O2 SATURATION & CARDIAC OUTPUT N/A 04/29/2019    Procedure: Right Heart Catheterization;  Surgeon: Rosana Hoes, MD;  Location: [facility] CATH;  Service: Cardiology   ??? PR THORACOSCOPY SURG TOT PULM DECORT Left 12/14/2014    Procedure: THORACOSCOPY SURG; W/TOT PULM DECORTIC/PNEUMOLYS;  Surgeon: Evert Kohl, MD;  Location: MAIN OR [facility];  Service: Cardiothoracic   ??? PR THORACOSCOPY W/THERA WEDGE RESEXN INITIAL UNILAT Left 12/03/2014    Procedure: THORACOSCOPY, SURGICAL; WITH THERAPEUTIC WEDGE RESECTION (EG, MASS, NODULE) INITIAL UNILATERAL;  Surgeon: Evert Kohl, MD;  [facility];  Service: Cardiothoracic       Medication:  Current Facility-Administered Medications   Medication Dose Route Frequency Provider Last Rate Last Admin   ??? acetaminophen (TYLENOL) tablet 650 mg  650 mg Oral Q4H PRN Salley Slaughter, MD       ??? cefepime (MAXIPIME) 2 g in dextrose 100 mL IVPB (premix)  2 g Intravenous Q12H Salley Slaughter, MD       ??? dextrose 50 % in water (D50W) 50 % solution 12.5 g  12.5 g Intravenous Q10 Min PRN Salley Slaughter, MD       ??? gabapentin (NEURONTIN) capsule 300 mg  300 mg Oral TID Salley Slaughter, MD       ??? insulin lispro (HumaLOG) injection 0-12 Units  0-12 Units Subcutaneous ACHS Salley Slaughter, MD       ??? ipratropium-albuteroL (DUO-NEB) 0.5-2.5 mg/3 mL nebulizer solution 3 mL  3 mL Nebulization Q6H (RT) Salley Slaughter, MD       ??? metroNIDAZOLE (FLAGYL) IVPB 500 mg  500 mg Intravenous Q8H Salley Slaughter, MD       ??? pravastatin (PRAVACHOL) tablet 40 mg  40 mg Oral Daily Salley Slaughter, MD       ??? sildenafiL (pulm.hypertension) (REVATIO) tablet 20 mg  20 mg Oral TID Salley Slaughter, MD       ??? sodium chloride 3 % nebulizer solution 4 mL  4 mL Nebulization Once Melanee Left, MD       ??? vancomycin (VANCOCIN) 1500 mg in sodium chloride (NS) 0.9 % 500 mL IVPB (premix)  1,500 mg Intravenous Q8H Salley Slaughter, MD         Current Outpatient Medications   Medication Sig Dispense Refill   ??? ACCU-CHEK FASTCLIX LANCET DRUM Misc USE TO CHECK GLUCOSE 4 TIMES DAILY BEFORE MEAL(S) AND NIGHTLY     ??? ACCU-CHEK GUIDE GLUCOSE METER Misc 1 Device by Other route Four (4) times a day (before meals and nightly). AS DIRECTED 1 kit 0   ??? albuterol 2.5 mg /3 mL (0.083 %) nebulizer solution Inhale 3 mL (2.5 mg total) by nebulization every six (6) hours as needed for wheezing or shortness of breath (airway clearance/cough). 120 mL 0   ??? albuterol HFA 90 mcg/actuation inhaler Inhale 2 puffs every four (4) hours as needed for wheezing or shortness of breath. 8.5 g 3   ??? blood sugar diagnostic Strp by Other route Three (3) times a day. 200 strip 2   ??? budesonide-formoteroL (SYMBICORT) 160-4.5 mcg/actuation inhaler Inhale 2 puffs Two (2) times  a day. 10.2 g 3   ??? cyclobenzaprine (FLEXERIL) 10 MG tablet Take 1 tablet (10 mg total) by mouth Three (3) times a day as needed. 90 tablet 0   ??? empty container Misc USE AS DIRECTED 1 each 2   ??? EPINEPHrine (EPIPEN 2-PAK) 0.3 mg/0.3 mL injection Inject 0.3 mL (0.3 mg total) into the muscle once for 1 dose. 1 Device 1   ??? gabapentin (NEURONTIN) 300 MG capsule Take 1 capsule (300 mg total) by mouth Three (3) times a day. 270 capsule 1   ??? hydroCHLOROthiazide (HYDRODIURIL) 25 MG tablet Take 1 tablet (25 mg total) by mouth daily. TAKE ONE (1) TABLET BY MOUTH EVERY DAY 90 tablet 1   ??? insulin ASPART (NOVOLOG U-100 INSULIN ASPART) 100 unit/mL injection Take 1-20 units SQ QID AC and HS as directed 80 mL 1   ??? insulin glargine (BASAGLAR, LANTUS) 100 unit/mL (3 mL) injection pen Inject 0.18 mL (18 Units total) under the skin daily. 18 units 18 mL 1   ??? ipratropium (ATROVENT) 0.02 % nebulizer solution Inhale 2.5 mL (500 mcg total) by nebulization Four (4) times a day. 900 mL 1   ??? ketoconazole (NIZORAL) 2 % cream Apply 1 application topically Two (2) times a day. Use for 2-3 weeks. 30 g 11   ??? ketoconazole (NIZORAL) 2 % cream Apply 1 application topically Two (2) times a day. 60 g 5   ??? MEDICAL SUPPLY ITEM Accucheck Glide Glocumeter for home glucose checks. Check blood glucose ACHS. 1 Package 0   ??? mepolizumab (NUCALA) 100 mg/mL AtIn Inject the contents of 1 syringe (100 mg) under the skin every twenty-eight (28) days. 1 mL 12   ??? metFORMIN (GLUCOPHAGE) 1000 MG tablet Take 1 tablet (1,000 mg total) by mouth 2 (two) times a day with meals. 180 tablet 1   ??? miscellaneous medical supply Misc Nebulizer machine and tubing. Patients machine is 56 years old. 1 each 0   ??? mupirocin (BACTROBAN) 2 % ointment Apply topically Two (2) times a day for 7 days. 30 g 1   ??? OXYGEN-AIR DELIVERY SYSTEMS MISC Dose: 2-3 LPM     ??? posaconazole (NOXAFIL) 100 mg TbEC delayed released tablet Take 200 mg by mouth daily. 60 tablet 2   ??? pravastatin (PRAVACHOL) 40 MG tablet Take 1 tablet (40 mg total) by mouth daily with evening meal. 90 tablet 1   ??? predniSONE (DELTASONE) 10 MG tablet Take 1 tablet (10 mg total) by mouth daily. Take 1 tablet daily (10 mg) daily until seen by Dr. Wellington Hampshire (Patient not taking: Reported on 12/11/2019) 30 tablet 0   ??? sildenafiL, pulm.hypertension, (REVATIO) 20 mg tablet Take 1 tablet (20 mg total) by mouth Three (3) times a day. 90 tablet 11       Allergies:  No Known Allergies    Social History:  Social History     Tobacco Use   ??? Smoking status: Former Smoker     Packs/day: 0.50     Years: 9.00     Pack years: 4.50     Types: Cigarettes     Quit date: 05/03/1987     Years since quitting: 32.6   ??? Smokeless tobacco: Never Used   Vaping Use   ??? Vaping Use: Never used   Substance Use Topics   ??? Alcohol use: No     Alcohol/week: 0.0 standard drinks   ??? Drug use: No       Family History:  Family History  Problem Relation Age of Onset   ??? Diabetes Father    ??? Diabetes Brother    ??? Diabetes Maternal Aunt    ??? Sarcoidosis Maternal Aunt    ??? Diabetes Maternal Uncle    ??? Diabetes Paternal Aunt    ??? Diabetes Paternal Uncle        Review of Systems  10 systems were reviewed and are negative except as noted specifically in the HPI.    Objective:   BP 130/90  - Pulse 110  - Temp 36.8 ??C  - Resp 22  - LMP  (LMP Unknown)  - SpO2 93%     Physical Exam  General Appearance:  No acute distress, well appearing and well nourished.   Head:  Normocephalic, atraumatic.   Eyes:  PERRL. Anicteric sclerae.   Ears:  Hearing grossly normal.   Nose: No rhinorrhea.   Throat: MMM.   Neck: Supple, symmetrical, trachea midline.   Pulmonary:    Normal respiratory effort on nasal cannula.    Cardiovascular:  RRR   Abdomen:    Soft, nontender, nondistended   Musculoskeletal:  No peripheral edema. Right 5th toe medial aspect large ulceration with bone exposed, joint exposed. Joint synovial fluid is coming out through the wound. Malodor noted. Edema and erythema noted to right 5th toe.    Skin: No apparent skin rashes or lesions.   Neurologic: Moves all extremities spontaneously.   Vascular: 2+ DP, PT pulses bilaterally             Labs:  Lab Results   Component Value Date    WBC 5.4 12/11/2019    HGB 12.0 12/11/2019    HCT 38.6 12/11/2019    PLT 280 12/11/2019       Lab Results   Component Value Date    NA 135 12/11/2019    K 4.1 12/11/2019    CL 99 12/11/2019    CO2 31.0 12/11/2019    BUN 15 12/11/2019    CREATININE 1.34 (H) 12/11/2019    GLU 247 (H) 12/11/2019    CALCIUM 8.8 12/11/2019    MG 1.5 (L) 08/28/2018    PHOS 4.3 08/28/2018       Lab Results   Component Value Date    BILITOT 0.5 12/11/2019    BILIDIR <0.10 05/30/2019    PROT 7.1 12/11/2019    ALBUMIN 3.3 (L) 12/11/2019    ALT 15 12/11/2019    AST 16 12/11/2019    ALKPHOS 88 12/11/2019    GGT 60 (H) 05/31/2011       Lab Results   Component Value Date    PT 11.9 12/11/2019    INR 1.02 12/11/2019    APTT 30.9 12/11/2019       Imaging:  Reviewed    Derrick Ravel, MD  Vascular Surgery, PGY-2

## 2019-12-12 NOTE — Unmapped (Signed)
SRV Progress Note    Admit Date: 12/11/2019, Hospital Day: 2  Hospital Service: Surg Vascular (SRV)  Attending: Deneise Lever, MD    Assessment   54 y.o. F w/ PMH of sarcoidosis, aspergillus PNA, LUL lobectomy in 2021, T2DM, who was admitted for worsening soft tissue wound in the right fifth toe with x-ray evidence of osteomyelitis. Patient w/ no hx of vascular disease.    Interval Events/Subjective:  NAEON. Pain well controlled this AM. NPO for OR w/ Podiatry.    Plan     NPO for Right 5th toe amputation with Podiatry today    Neuro:  - MM pain control    - Sch Gabapentin 300 mg TID   - PRN Tylenol 650 mg q4h    CVS:   - HDS    Pulm:   - NWOB on 5 L [place], titrate O2 to maintain SPO2 > 92%  - Sch Duoneb q6h   - Pulm toilet, IS  - CXR on 9/24 w/ stable to slight interval progression of interstitial lung disease secondary to sarcoidosis.    FEN/GI:   - NPO for OR today   - SSI    GU:   - Voiding spontaneously    Heme/ID:   - Vanc/Cef/Flagyl    Disposition:   Floor    Please page SRV intern pager with questions or concerns.    Vitals:   Temp:  [36.4 ??C-36.8 ??C] 36.4 ??C  Heart Rate:  [96-150] 150  SpO2 Pulse:  [101-104] 101  Resp:  [18-22] 18  BP: (130-145)/(78-98) 137/83  MAP (mmHg):  [102-109] 104  SpO2:  [52 %-98 %] 98 %    Intake/Output last 24 hours:    Intake/Output Summary (Last 24 hours) at 12/12/2019 1114  Last data filed at 12/12/2019 1022  Gross per 24 hour   Intake 100 ml   Output 0 ml   Net 100 ml       Physical Exam:  -General:  Appropriate and in no apparent distress.  -Neurological: Alert and oriented. Moves all 4 extremities spontaneously.   -Cardiovascular: RRR  -Pulmonary: NWOB on 5 L [place]  -Abdomen: Soft, non-tender, non-distended.   -Extremities: Right foot w/ open nonhealing wound on fifth toe w/ exposed bone.   -----------------------------------------------------    Data Review:  Labs and imaging reviewed past 24 hrs    Boykin Nearing, MD  General Surgery PGY-1

## 2019-12-12 NOTE — Unmapped (Signed)
Podiatry Inpatient Progress Note    Requesting Attending Physician :  Deneise Lever, MD  Service Requesting Consult : Surg Vascular (SRV)    Assessment  Right foot fifth toe medial aspect ulceration with right fifth phalanx osteomyelitis  Sarcoidosis  History of aspergillus pneumonia with left upper lobectomy in 2016  Patient on 2 L of oxygen per minute which has recently increased to 5 L/min  Uncontrolled T2DM and multiple co-morbidities      Principal Problem:    Osteomyelitis of ankle and foot (CMS-HCC)      Plan  Patient was seen and evaluated on 12/12/2019  Right foot dressings changed.  Wound is worsening with exposed bone to the right foot medial aspect of fifth toe.  Malodor present.  Purulent present.  Patient has failed conservative treatment options and would like to proceed with surgical intervention.  Procedure to be performed discussed with the patient in detail included: Right foot fifth toe amputation at the metatarsal phalangeal joint versus partial ray(toe plus metatarsal) and any other indicated procedures needed intraoperatively  Risks and benefits reviewed with patient in detail. Some of the risks discussed with the patient included pain, burning, bleeding, infection, swelling, blood clots, delayed healing, non healing, need for further surgery, loss of function, loss of limb, loss of life. All questions answered and no guarantees were given or implied. Patient expressed understanding. Written consent was signed, witnessed and placed in patient's chart.   On-call to the OR today.  Continue n.p.o. status.  Betadine and dry sterile dressings applied to the right foot.  Broad-spectrum antibiotics recommended  Medical management per primary team  Podiatry following    Subjective patient was sent to hospital from her podiatry clinic yesterday on 12/10/2019 for worsening right foot fifth toe infection and ulcerations with osteomyelitis.  Patient presented to ED yesterday and was admitted under vascular surgery.  Patient had preop work-up done today.   Patient denies any pain to the right foot.  Patient has been n.p.o. for the procedure today.  Patient denies any other complaints.  Patient denies any shortness of breath, chest pain, fever, nausea, vomiting, chills.  Patient is on 5 L/min oxygen.      Allergies:  Patient has no known allergies.    Medications:   Facility-Administered Medications Prior to Admission   Medication Dose Route Frequency Provider Last Rate Last Admin   ??? sodium chloride 3 % nebulizer solution 4 mL  4 mL Nebulization Once Melanee Left, MD         Medications Prior to Admission   Medication Sig Dispense Refill Last Dose   ??? ACCU-CHEK FASTCLIX LANCET DRUM Misc USE TO CHECK GLUCOSE 4 TIMES DAILY BEFORE MEAL(S) AND NIGHTLY      ??? ACCU-CHEK GUIDE GLUCOSE METER Misc 1 Device by Other route Four (4) times a day (before meals and nightly). AS DIRECTED 1 kit 0    ??? albuterol 2.5 mg /3 mL (0.083 %) nebulizer solution Inhale 3 mL (2.5 mg total) by nebulization every six (6) hours as needed for wheezing or shortness of breath (airway clearance/cough). 120 mL 0    ??? albuterol HFA 90 mcg/actuation inhaler Inhale 2 puffs every four (4) hours as needed for wheezing or shortness of breath. 8.5 g 3    ??? [EXPIRED] amoxicillin-clavulanate (AUGMENTIN) 875-125 mg per tablet Take 1 tablet by mouth every twelve (12) hours for 14 days. 28 tablet 0    ??? blood sugar diagnostic Strp by Other route Three (3)  times a day. 200 strip 2    ??? budesonide-formoteroL (SYMBICORT) 160-4.5 mcg/actuation inhaler Inhale 2 puffs Two (2) times a day. 10.2 g 3    ??? [EXPIRED] clindamycin (CLEOCIN) 300 MG capsule Take 1 capsule (300 mg total) by mouth Four (4) times a day for 10 days. 40 capsule 0    ??? cyclobenzaprine (FLEXERIL) 10 MG tablet Take 1 tablet (10 mg total) by mouth Three (3) times a day as needed. 90 tablet 0    ??? empty container Misc USE AS DIRECTED 1 each 2    ??? EPINEPHrine (EPIPEN 2-PAK) 0.3 mg/0.3 mL injection Inject 0.3 mL (0.3 mg total) into the muscle once for 1 dose. 1 Device 1    ??? gabapentin (NEURONTIN) 300 MG capsule Take 1 capsule (300 mg total) by mouth Three (3) times a day. 270 capsule 1    ??? hydroCHLOROthiazide (HYDRODIURIL) 25 MG tablet Take 1 tablet (25 mg total) by mouth daily. TAKE ONE (1) TABLET BY MOUTH EVERY DAY 90 tablet 1    ??? insulin ASPART (NOVOLOG U-100 INSULIN ASPART) 100 unit/mL injection Take 1-20 units SQ QID AC and HS as directed 80 mL 1    ??? insulin glargine (BASAGLAR, LANTUS) 100 unit/mL (3 mL) injection pen Inject 0.18 mL (18 Units total) under the skin daily. 18 units 18 mL 1    ??? ipratropium (ATROVENT) 0.02 % nebulizer solution Inhale 2.5 mL (500 mcg total) by nebulization Four (4) times a day. 900 mL 1    ??? ketoconazole (NIZORAL) 2 % cream Apply 1 application topically Two (2) times a day. Use for 2-3 weeks. 30 g 11    ??? ketoconazole (NIZORAL) 2 % cream Apply 1 application topically Two (2) times a day. 60 g 5    ??? MEDICAL SUPPLY ITEM Accucheck Glide Glocumeter for home glucose checks. Check blood glucose ACHS. 1 Package 0    ??? mepolizumab (NUCALA) 100 mg/mL AtIn Inject the contents of 1 syringe (100 mg) under the skin every twenty-eight (28) days. 1 mL 12    ??? metFORMIN (GLUCOPHAGE) 1000 MG tablet Take 1 tablet (1,000 mg total) by mouth 2 (two) times a day with meals. 180 tablet 1    ??? miscellaneous medical supply Misc Nebulizer machine and tubing. Patients machine is 56 years old. 1 each 0    ??? [EXPIRED] mupirocin (BACTROBAN) 2 % ointment Apply topically Two (2) times a day for 7 days. 30 g 1    ??? OXYGEN-AIR DELIVERY SYSTEMS MISC Dose: 2-3 LPM      ??? posaconazole (NOXAFIL) 100 mg TbEC delayed released tablet Take 200 mg by mouth daily. 60 tablet 2    ??? pravastatin (PRAVACHOL) 40 MG tablet Take 1 tablet (40 mg total) by mouth daily with evening meal. 90 tablet 1    ??? predniSONE (DELTASONE) 10 MG tablet Take 1 tablet (10 mg total) by mouth daily. Take 1 tablet daily (10 mg) daily until seen by Dr. Wellington Hampshire (Patient not taking: Reported on 12/11/2019) 30 tablet 0    ??? sildenafiL, pulm.hypertension, (REVATIO) 20 mg tablet Take 1 tablet (20 mg total) by mouth Three (3) times a day. 90 tablet 11        Code Status:  Full Code      Objective:   Patient Vitals for the past 8 hrs:   BP Temp Temp src Pulse Resp SpO2   12/12/19 0912 ??? ??? ??? 150 ??? ???   12/12/19 0743 137/83 36.4 ??C (97.6 ??F)  Oral 97 18 98 %   12/12/19 0730 ??? ??? ??? ??? ??? 90 %   12/12/19 0629 ??? ??? ??? ??? ??? 95 %   12/12/19 0626 ??? ??? ??? ??? ??? 94 %   12/12/19 0624 ??? ??? ??? ??? ??? 95 %   12/12/19 0621 ??? ??? ??? ??? ??? (!) 81 %   12/12/19 0558 ??? ??? ??? ??? ??? 94 %   12/12/19 0545 ??? ??? ??? ??? ??? 95 %   12/12/19 0531 ??? ??? ??? ??? ??? (!) 52 %   12/12/19 0321 ??? ??? ??? ??? ??? 92 %   12/12/19 0317 ??? ??? ??? ??? ??? (!) 78 %     I/O this shift:  In: 100 [IV Piggyback:100]  Out: 0     Labs:  Recent Labs   Lab Units 12/12/19  0715 12/11/19  1621   WBC 10*9/L 5.9 5.4   RBC 10*12/L 4.33 4.47   HEMOGLOBIN g/dL 16.1* 09.6   HEMATOCRIT % 37.0 38.6   PLATELET COUNT (1) 10*9/L 324 280   MONO ABS 10*9/L  --  0.4   BASOS ABS 10*9/L  --  0.1     Recent Labs   Lab Units 12/12/19  0715   SODIUM mmol/L 136   POTASSIUM mmol/L 4.3   CHLORIDE mmol/L 101   CO2 mmol/L 28.0   BUN mg/dL 17   CREATININE mg/dL 0.45*   EGFR CKD-EPI AA FEMALE mL/min/1.34m2 54*   EGFR CKD-EPI NON-AA FEMALE mL/min/1.89m2 47*   GLUCOSE mg/dL 409*     Recent Labs   Lab Units 12/11/19  1621   INR  1.02   PT sec 11.9   APTT sec 30.9   ,   No results in the last week   No results in the last week       Physical Exam:  General: AOx3    Right foot open wound nonhealing to the medial aspect of the fifth toe with exposed bone, fibrotic wound base, purulent drainage, malodor present.  No necrotizing infection noted.  Pedal pulses palpable bilaterally.  CFT is less than 3 seconds to all the toes.  MSK: 5-5 for major muscle groups of bilateral lower extremity.  Neuro: Epicritic sensation grossly absent bilateral feet due to peripheral neuropathy.    Time spent on counseling/coordination of care: 30 Minutes  Total time spent with patient: 30 Minutes

## 2019-12-12 NOTE — Unmapped (Cosign Needed)
Internal Medicine Consult Service Note    Assessment & Plan:   Karen Barber is a 53 y.o. 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 The medicine consult team is being consulted for optimization of her other medical conditions.    Principal Problem:    Osteomyelitis of ankle and foot (CMS-HCC)  Active Problems:    Pulmonary sarcoidosis (CMS-HCC)    HTN (hypertension)    Type 2 diabetes mellitus with hyperglycemia (CMS-HCC)    Chronic respiratory failure with hypoxia (CMS-HCC)    Chronic pulmonary aspergillosis (CMS-HCC)    Pulmonary hypertension (CMS-HCC)  Resolved Problems:    * No resolved hospital problems. *    1. Sarcoidosis c/b chronic hypoxemic respiratory failure  Diagnosed in 1995 with a lip biopsy. She was followed by Dr. Wellington Hampshire with [facility], but recently transferred to her care to [facility]. She had evidence of active sarcoidosis on her most recent PET scan 11/19/2018, however, there was concern for intensifying her immunosuppression due to her history of pulmonary infections. She has never been treated with immunosuppressants. Instead, she was treated with high-dose prednisone (80 mg daily) for several months and then tapered to 10 mg daily. She reports self discontinuing steroids 3 weeks ago. She has been seen in transplant clinic but was ultimately not a candidate due to her anatomy. Currently, she is not endorsing any pulmonary symptoms of dyspnea, orthopnea, chest pain, or syncope. Also no joint pain or skin yellowing to suggest liver involvement. She is using 5L [place] which is baseline, though, interestingly her 6 min walk test 12/12/19 indicated that she only needed up to 4L with exercise.   -- Wean oxygen as tolerated for goal O2 sat >92%   -- No stress dose steroids indicated as patient has been off prednisone for weeks     2. 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 Other pulmonary infections include Stenotrophomonas in 12/03/14, Achromobacter in 11/18/14 and 09/25/16. 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 She follows with [facility] and was recommended to continue suppressive posaconazole 200 mg daily. Last LFT check 9.23 were within normal range.   -- Continue posaconazole 200 mg daily    3. 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 Patient told [facility] pulmonology team that she discontinued sildenafil, though she tells me that she's still taking it.   -- Continue sildenafil 10 mg TID    4. Asthma   Slight expiratory wheeze on exam.   -- Continue symbicort inhaler  -- Albuterol q4 hrs PRN    5. CKD Stage 3  Baseline creatinine 1.09-1.2. Currently creatinine at baseline. Likely secondary to DM and HTN.   -- Daily BMP    6. T2DM   Last hemoglobin A1c: 9.9. Current regimen includes metformin 1000 mg BID, lantus 27 units nightly and short acting insulin. Endocrinology managing.    7. Hypertension   Managed with hydrochlorothiazide 25 mg daily as outpatient.   -- Hold hydrochlorothiazide pre-operatively  -- We will follow BPs to determine when safe to resume    8. Right 5th toe osteomyelitis s/p amputation 9/24  Patient admitted for amputation after evaluation in podiatry clinic where she was  found to have large ulceration and bone exposure on right 5th toe. X-ray confirmed osteomyelitis. She was empirically treated with vanc/cefe/flagyl prior to amputation on 9/24.  -- Continue vanc/cefepime/flagyl for empiric coverage  -- Follow up bone cultures to narrow antibiotic therapy   -- If patient tolerating PO after surgery, favor discontinuation of fluids by 2100 tonight   -- Pain control with scheduled Tylenol for mild/moderate pain, PRN oxycodone for severe pain   -- Scheduled bowel regimen with Senna/Miralax if consistently utilizing oxycodone    For questions between 7:30AM-5PM, please page the Medicine Consult Service pager at [phone]. After 5PM, the Medicine Consult Service is covered by the [facility] On-Call Resident for urgent/emergent questions or concerns.     Subjective:   HPI:  Karen Barber is a 54 y.o. 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     Patient has sensation in her feet and does not report pain in her toe. She feels like her breathing is at baseline and denies dyspnea, orthopnea, and chest pain. She uses 5L [place] at baseline. She currently uses a Symbicort inhaler and takes sildenafil and posaconazole for her lung pathologies. She recently transferred her pulmonology care from [facility] to [place]. In regards to her sarcoidosis, she is no longer taking prednisone. She says she self discontinued about 3 weeks ago. Her glucoses have been better controlled since stopping steroids. She currently takes 27 units lantus and sliding scale and says that her morning glucoses have been in the low 100s.    On ROS, she denies fever, chills, chest pain, shortness of breath, abdominal pain, N/V, urinary symptoms, and LE swelling.     Allergies:  Patient has no known allergies.    Medications:   Prior to Admission medications    Medication Dose, Route, Frequency   hydroCHLOROthiazide (HYDRODIURIL) 25 MG tablet 25 mg, Oral, Daily (standard), TAKE ONE (1) TABLET BY MOUTH EVERY DAY   mupirocin (BACTROBAN) 2 % ointment Topical, 2 times a day (standard)   ACCU-CHEK FASTCLIX LANCET DRUM Misc USE TO CHECK GLUCOSE 4 TIMES DAILY BEFORE MEAL(S) AND NIGHTLY   ACCU-CHEK GUIDE GLUCOSE METER Misc 1 Device, Other, 4 times a day (ACHS), AS DIRECTED   albuterol 2.5 mg /3 mL (0.083 %) nebulizer solution 2.5 mg, Nebulization, Every 6 hours PRN   albuterol HFA 90 mcg/actuation inhaler 2 puffs, Inhalation, Every 4 hours PRN   blood sugar diagnostic Strp Other, 3 times a day (standard)   budesonide-formoteroL (SYMBICORT) 160-4.5 mcg/actuation inhaler 2 puffs, Inhalation, 2 times a day (standard)   cyclobenzaprine (FLEXERIL) 10 MG tablet 10 mg, Oral, 3 times a day PRN   empty container Misc USE AS DIRECTED   EPINEPHrine (EPIPEN 2-PAK) 0.3 mg/0.3 mL injection 0.3 mg, Intramuscular, Once   gabapentin (NEURONTIN) 300 MG capsule 300 mg, Oral, 3 times a day (standard)   insulin ASPART (NOVOLOG U-100 INSULIN ASPART) 100 unit/mL injection Take 1-20 units SQ QID AC and HS as directed   insulin glargine (BASAGLAR, LANTUS) 100 unit/mL (3 mL) injection pen 18 Units, Subcutaneous, Daily (standard), 18 units   ipratropium (ATROVENT) 0.02 % nebulizer solution 500 mcg, Nebulization, 4 times a day   ketoconazole (NIZORAL) 2 % cream 1 application, Topical, 2 times a day (standard), Use for 2-3 weeks.   ketoconazole (NIZORAL) 2 % cream 1 application, Topical, 2 times a day (standard)   MEDICAL SUPPLY  ITEM Accucheck Glide Glocumeter for home glucose checks. Check blood glucose ACHS.   mepolizumab (NUCALA) 100 mg/mL AtIn Inject the contents of 1 syringe (100 mg) under the skin every twenty-eight (28) days.   metFORMIN (GLUCOPHAGE) 1000 MG tablet 1,000 mg, Oral, 2 times a day with meals   miscellaneous medical supply Misc Nebulizer machine and tubing. Patients machine is 56 years old.   OXYGEN-AIR DELIVERY SYSTEMS MISC Dose: 2-3 LPM   posaconazole (NOXAFIL) 100 mg TbEC delayed released tablet 200 mg, Oral, Daily (standard)   pravastatin (PRAVACHOL) 40 MG tablet 40 mg, Oral, Daily   sildenafiL, pulm.hypertension, (REVATIO) 20 mg tablet 20 mg, Oral, 3 times a day (standard)       Medical History:  Past Medical History:   Diagnosis Date   ??? 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 Breast injury     lung surg on left incision under breast sept 2016   ??? Caregiver burden     parents    ??? Hepatitis C antibody test positive 2016    repeatedly negative HCV RNA, indicating clearance of infection w/o treatment   ??? Hyperlipidemia    ??? Hypertension    ??? 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 On home O2 2008    per Dr Mal Amabile note from 02/21/2017   ??? Pulmonary aspergillosis (CMS-HCC) 2016    A.fumigatus in 2016, prior to LUL lobectomy. A.niger from sputum 08/2016-09/2016.   ??? 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 Sarcoidosis 1995   ??? Type 2 diabetes mellitus with hyperglycemia (CMS-HCC) 07/27/2014   ??? Visual impairment     glasses       Surgical History:  Past Surgical History:   Procedure Laterality Date   ??? LUNG LOBECTOMY     ??? PR RIGHT HEART CATH O2 SATURATION & CARDIAC OUTPUT N/A 04/29/2019    Procedure: Right Heart Catheterization;  Surgeon: Rosana Hoes, MD;  Location: [facility] CATH;  Service: Cardiology   ??? PR THORACOSCOPY SURG TOT PULM DECORT Left 12/14/2014    Procedure: THORACOSCOPY SURG; W/TOT PULM DECORTIC/PNEUMOLYS;  Surgeon: Evert Kohl, MD;  Location: MAIN OR [facility];  Service: Cardiothoracic   ??? PR THORACOSCOPY W/THERA WEDGE RESEXN INITIAL UNILAT Left 12/03/2014    Procedure: THORACOSCOPY, SURGICAL; WITH THERAPEUTIC WEDGE RESECTION (EG, MASS, NODULE) INITIAL UNILATERAL;  Surgeon: Evert Kohl, MD;  Location: MAIN OR [facility];  Service: Cardiothoracic       Family History:   Family History   Problem Relation Age of Onset   ??? Diabetes Father    ??? Diabetes Brother    ??? Diabetes Maternal Aunt    ??? Sarcoidosis Maternal Aunt    ??? Diabetes Maternal Uncle    ??? Diabetes Paternal Aunt    ??? Diabetes Paternal Uncle        Social History:  The patient lives with family. She lives with her husband. Denies alcohol and drug use. Quit smoking 30 years ago.    Social History     Tobacco Use   ??? Smoking status: Former Smoker     Packs/day: 0.50 Years: 9.00     Pack years: 4.50     Types: Cigarettes     Quit date: 05/03/1987     Years since quitting: 32.6   ??? Smokeless tobacco: Never Used   Vaping Use   ??? Vaping Use: Never  used   Substance Use Topics   ??? Alcohol use: No     Alcohol/week: 0.0 standard drinks   ??? Drug use: No        Review of Systems:  10 systems were reviewed and are negative unless otherwise mentioned in the HPI    Objective:   Physical Exam:  Temp:  [36.1 ??C-36.8 ??C] 36.1 ??C  Heart Rate:  [94-110] 94  SpO2 Pulse:  [101-104] 101  Resp:  [16-22] 16  BP: (130-145)/(78-98) 140/80  SpO2:  [52 %-98 %] 95 %    Gen: In NAD, answers questions appropriately  Eyes: Sclera anicteric, EOMI, PERRLA,  HENT: Atraumatic, normocephalic, MMM    Heart: RRR, S1, S2, no M/R/G, no chest wall tenderness  Lungs: Using 5L [place], CTAB, no crackles or wheezes, no use of accessory muscles, no increased WOB  Abdomen: Normoactive bowel sounds, soft, NTND, no rebound/guarding  Extremities: no clubbing, cyanosis, or edema: pulses are +2 in bilateral upper and lower extremities  Neuro: CN II-XI grossly intact, normal cerebellar function, normal gait. No focal deficits.  Skin:  No rashes, lesions on clothed exam  Psych: Alert, oriented, normal mood and affect.     Labs/Studies/Imaging:  Labs, Studies, Imaging from the last 24hrs per EMR and personally reviewed    Wonda Cerise, MD   [facility] Internal Medicine, PGY2  Pager: [phone]

## 2019-12-12 NOTE — Unmapped (Signed)
Brief Operative Note  (CSN: [id number])      Date of Surgery: 12/12/2019    Pre-op Diagnosis: Right foot fifth toe osteomyelitis, open wound nonhealing    Post-op Diagnosis: Same    Procedure(s):  Right foot fifth toe amputation at the metatarsophalangeal joint level: 28820 (CPT??)  Note: Revisions to procedures should be made in chart - see Procedures activity.    Performing Service: Vascular  Surgeon(s) and Role:     * Britt Bottom, DPM - Primary    Assistant: None    Findings: Clean margins achieved at fifth metatarsophalangeal joint.    Anesthesia: Monitor Anesthesia Care    Estimated Blood Loss: 20 mL    Complications: None    Specimens:   ID Type Source Tests Collected by Time Destination   1 : RIGHT FOOT 5TH TOE Amputation Foot, Right SURGICAL PATHOLOGY EXAM Britt Bottom, DPM 12/12/2019 1415    A : CLEAN BONE CULTURE RIGHT 5TH TOE Bone Foot, Right AEROBIC/ANAEROBIC CULTURE, FUNGAL CULTURE, AFB CULTURE Britt Bottom, DPM 12/12/2019 1412    B : RIGHT FOOT SOFT TISSUE CULTURE Tissue Foot, Right AEROBIC/ANAEROBIC CULTURE, FUNGAL CULTURE, AFB CULTURE Britt Bottom, DPM 12/12/2019 1413        Implants: * No implants in log *    Surgeon Notes: I performed the procedure    Britt Bottom   Date: 12/12/2019  Time: 2:46 PM

## 2019-12-12 NOTE — Unmapped (Signed)
Vancomycin Therapeutic Monitoring Pharmacy Note    Karen Barber is a 54 y.o. female starting vancomycin. Date of therapy initiation: 12/11/19    Indication: Osteomyelitis    Prior Dosing Information: None/new initiation     Goals:  Therapeutic Drug Levels  Vancomycin trough goal: 15-20 mg/L    Additional Clinical Monitoring/Outcomes  Renal function, volume status (intake and output)    Results: Not applicable    Wt Readings from Last 1 Encounters:   12/11/19 (!) 113.9 kg (251 lb 1.7 oz)     Creatinine   Date Value Ref Range Status   12/11/2019 1.34 (H) 0.55 - 1.02 mg/dL Final   27/25/3664 4.03 (H) 0.60 - 1.00 mg/dL Final   47/42/5956 3.87 (H) 0.60 - 1.00 mg/dL Final        Pharmacokinetic Considerations and Significant Drug Interactions:  ??? Adult (estimated initial): Vd = 80.9 L, ke = 0.049 hr-1  ??? Concurrent nephrotoxic meds: not applicable    Assessment/Plan:  Recommendation(s)  ??? Start vancomycin 2250 mg IV x 1 followed by 1000 mg IV q12h  ??? Estimated trough on recommended regimen: 16 mg/L    Follow-up  ??? Level due: prior to fourth or fifth dose  ??? A pharmacist will continue to monitor and order levels as appropriate    Please page service pharmacist with questions/clarifications.    Lebron Conners, PharmD

## 2019-12-12 NOTE — Unmapped (Signed)
Care Management  Initial Transition Planning Assessment              General  Care Manager assessed the patient by : In person interview with patient, In person interview with family, Medical record review, Discussion with Clinical Care team (Pt gave CM permission to complete the initial assessment with her significant other present)  Orientation Level: Oriented X4  Functional level prior to admission: Independent (Pt is independent at home with her ADLs and drives on occasion. Significant other does most of the driving. Pt denied any alcohol, tobacco, or illicit drug use.)  Reason for referral: Discharge Planning    Contact/Decision Maker  Extended Emergency Contact Information  Primary Emergency Contact: Conception Chancy [place]  Home Phone: [phone]  Mobile Phone: [phone]  Relation: Domestic Partner  Secondary Emergency Contact: Salle,Barbara And Dondra Spry [place]  Home Phone: [phone]  Mobile Phone: [phone]  Relation: Mother    Legal Next of Kin / Guardian / POA / Advance Directives     Advance Directive (Medical Treatment)  Does patient have an advance directive covering medical treatment?:  (Pt reported that she was working on it.)  Reason patient does not have an advance directive covering medical treatment:: Patient does not wish to complete one at this time.    Health Care Decision Maker [HCDM] (Medical & Mental Health Treatment)  Healthcare Decision Maker: Patient needs follow-up to appoint a Health Care Decision Maker.  Information offered on HCDM, Medical & Mental Health advance directives:: Patient given information.    Advance Directive (Mental Health Treatment)  Does patient have an advance directive covering mental health treatment?: Patient does not have advance directive covering mental health treatment.  Reason patient does not have an advance directive covering mental health treatment:: Patient does not wish to complete one at this time.    Patient Information  Lives with: Spouse/significant other    Type of Residence: Private residence     Location/Detail: This is a single story home with 2 steps to access from the front. Pt has railings and reported no difficulty navigating the steps.    Support Systems/Concerns: Significant Other    Responsibilities/Dependents at home?: No    Home Care services in place prior to admission?: No    Outpatient/Community Resources in place prior to admission:  (None)    Equipment Currently Used at Home: shower chair, oxygen (Pt also has a rollator that she uses at home.)  Current HME Agency (Name/Phone #): Pt gets her oxygen through [employer]. She has a concentrator and portable tanks at home. Pt uses the oxygen continuously at 5 liters via [place]    Currently receiving outpatient dialysis?: No    Type of Residence: Mailing Address:  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  Past Surgical History:   has  a past surgical history that includes pr thoracoscopy w/thera wedge resexn initial unilat (Left, 12/03/2014); pr thoracoscopy surg tot pulm decort (Left, 12/14/2014); Lung lobectomy; and pr right heart cath o2 saturation & cardiac output (N/A, 04/29/2019). Previous admit date: 02/16/2018    Primary Insurance- Payor: MEDICARE / Plan: MEDICARE PART A AND PART B / Product Type: *No Product type* /   Secondary Insurance ??? Secondary Insurance  MEDICAID [employer]  Prescription Coverage ??? Medicaid  Preferred Pharmacy - [facility] - [place] ([place]), [place] - [address]  [facility]  [facility]  [facility] [place] - [address]    Transportation home: Private vehicle. Mr. Eliberto Ivory, significant other, will transport the pt home when she discharges.      Financial Information     Need for financial assistance?: No. Pt is collecting disability       Social Determinants of Health    Financial Resource Strain: Low Risk    ??? Difficulty of Paying Living Expenses: Not hard at all   Food Insecurity: No Food Insecurity   ??? Worried About Programme researcher, broadcasting/film/video in the Last Year: Never true   ??? Ran Out of Food in the Last Year: Never true   Transportation Needs: No Transportation Needs   ??? Lack of Transportation (Medical): No   ??? Lack of Transportation (Non-Medical): No   Housing/Utilities: Low Risk    ??? Within the past 12 months, have you ever stayed: outside, in a car, in a tent, in an overnight shelter, or temporarily in someone else's home (i.e. couch-surfing)?: No   ??? Are you worried about losing your housing?: No   ??? Within the past 12 months, have you been unable to get utilities (heat, electricity) when it was really needed?: No       Discharge Needs Assessment  Concerns to be Addressed: discharge planning    Clinical Risk Factors: New Diagnosis    Barriers to taking medications: No    Prior overnight hospital stay or ED visit in last 90 days: No    Readmission Within the Last 30 Days: no previous admission in last 30 days    Anticipated Changes Related to Illness: none    Equipment Needed After Discharge: none    Discharge Facility/Level of Care Needs:  (Home with possible HH services)    Readmission  Risk of Unplanned Readmission Score: UNPLANNED READMISSION SCORE: 12%  Predictive Model Details          12% (Medium)  Factor Value    Calculated 12/12/2019 08:03 34% Number of active Rx orders 35    [place] Risk of Unplanned Readmission Model 13% ECG/EKG order present in last 6 months     9% Imaging order present in last 6 months     9% Latest hemoglobin low (11.4 g/dL)     8% Number of ED visits in last six months 1     6% Age 85     6% Active corticosteroid Rx order present     6% Latest creatinine high (1.34 mg/dL)     5% Charlson Comorbidity Index 3     3% Future appointment scheduled     1% Current length of stay 0.609 days      Readmitted Within the Last 30 Days? (No if blank)   Patient at risk for readmission?: Yes    Discharge Plan  Screen findings are: Discharge planning needs identified or  anticipated (Comment).    Expected Discharge Date: TBD    Expected Transfer from Critical Care:  (N/A)       Patient and/or family were provided with choice of facilities / services that are available and appropriate to meet post hospital care needs?: Other (Comment) (If the pt needs HH services when she discharges, the CM will provide a list of HH agencies for her review and choice)       Initial Assessment complete?: Yes    CM met with patient in pt room.  Pt/visitors were not wearing hospital provided masks for the duration of the interaction with CM.   CM was wearing hospital provided surgical mask and hospital provided eye protection.  CM was within 6 foot of the patient/visitors during this interaction.

## 2019-12-12 NOTE — Unmapped (Signed)
Alert and oriented x 4. Well appearing. No acute distress noted. Respirations even and unlabored with bilateral lung expansion. On O2 via [place] at 5 LPM (baseline at home). VSS.  Regular rate, normotensive. CPOX in place, patient desaturates at times. No pain reported. For OR today. NPO since midnight. Medicated as per MAR. No acute changes noted over night. Safety measures remain in place. Bed low and locked. Call bell within reach.      Problem: Impaired Wound Healing  Goal: Optimal Wound Healing  Outcome: Ongoing - Unchanged     Problem: Adult Inpatient Plan of Care  Goal: Plan of Care Review  Outcome: Progressing  Goal: Patient-Specific Goal (Individualized)  Outcome: Progressing  Goal: Absence of Hospital-Acquired Illness or Injury  Outcome: Progressing  Goal: Optimal Comfort and Wellbeing  Outcome: Progressing  Goal: Readiness for Transition of Care  Outcome: Progressing  Goal: Rounds/Family Conference  Outcome: Progressing

## 2019-12-13 LAB — BASIC METABOLIC PANEL
ANION GAP: 9 mmol/L (ref 5–14)
BLOOD UREA NITROGEN: 14 mg/dL (ref 9–23)
CALCIUM: 9.4 mg/dL (ref 8.7–10.4)
CHLORIDE: 100 mmol/L (ref 98–107)
CO2: 28 mmol/L (ref 20.0–31.0)
EGFR CKD-EPI AA FEMALE: 54 mL/min/{1.73_m2} — ABNORMAL LOW (ref >=60)
EGFR CKD-EPI NON-AA FEMALE: 47 mL/min/{1.73_m2} — ABNORMAL LOW (ref >=60)
GLUCOSE RANDOM: 253 mg/dL — ABNORMAL HIGH (ref 70–179)
POTASSIUM: 4.5 mmol/L (ref 3.4–4.5)
SODIUM: 137 mmol/L (ref 135–145)

## 2019-12-13 LAB — VANCOMYCIN RANDOM: Vancomycin^random:MCnc:Pt:Ser/Plas:Qn:: 19.2

## 2019-12-13 LAB — MAGNESIUM: Magnesium:MCnc:Pt:Ser/Plas:Qn:: 1.5 — ABNORMAL LOW

## 2019-12-13 LAB — BUN / CREAT RATIO: Urea nitrogen/Creatinine:MRto:Pt:Ser/Plas:Qn:: 11

## 2019-12-13 LAB — CBC
HEMOGLOBIN: 11.8 g/dL — ABNORMAL LOW (ref 12.0–16.0)
MEAN CORPUSCULAR HEMOGLOBIN CONC: 29.9 g/dL — ABNORMAL LOW (ref 31.0–37.0)
MEAN CORPUSCULAR VOLUME: 87.1 fL (ref 80.0–100.0)
MEAN PLATELET VOLUME: 8.9 fL (ref 7.0–10.0)
PLATELET COUNT: 297 10*9/L (ref 150–440)
RED CELL DISTRIBUTION WIDTH: 14.2 % (ref 12.0–15.0)
WBC ADJUSTED: 8.3 10*9/L (ref 4.5–11.0)

## 2019-12-13 LAB — PHOSPHORUS: Phosphate:MCnc:Pt:Ser/Plas:Qn:: 3.6

## 2019-12-13 LAB — MEAN CORPUSCULAR HEMOGLOBIN: Erythrocyte mean corpuscular hemoglobin:EntMass:Pt:RBC:Qn:Automated count: 26.1

## 2019-12-13 MED ADMIN — insulin lispro (HumaLOG) injection 0-12 Units: 0-12 [IU] | SUBCUTANEOUS | @ 16:00:00

## 2019-12-13 MED ADMIN — insulin lispro (HumaLOG) injection 0-12 Units: 0-12 [IU] | SUBCUTANEOUS | @ 04:00:00

## 2019-12-13 MED ADMIN — ipratropium-albuteroL (DUO-NEB) 0.5-2.5 mg/3 mL nebulizer solution 3 mL: 3 mL | RESPIRATORY_TRACT | @ 22:00:00

## 2019-12-13 MED ADMIN — gabapentin (NEURONTIN) capsule 300 mg: 300 mg | ORAL | @ 18:00:00

## 2019-12-13 MED ADMIN — insulin lispro (HumaLOG) injection 0-12 Units: 0-12 [IU] | SUBCUTANEOUS | @ 22:00:00

## 2019-12-13 MED ADMIN — magnesium sulfate 2gm/50mL IVPB: 2 g | INTRAVENOUS | @ 18:00:00 | Stop: 2019-12-13

## 2019-12-13 MED ADMIN — ipratropium-albuteroL (DUO-NEB) 0.5-2.5 mg/3 mL nebulizer solution 3 mL: 3 mL | RESPIRATORY_TRACT | @ 09:00:00

## 2019-12-13 MED ADMIN — metroNIDAZOLE (FLAGYL) IVPB 500 mg: 500 mg | INTRAVENOUS | @ 06:00:00 | Stop: 2019-12-21

## 2019-12-13 MED ADMIN — ipratropium-albuteroL (DUO-NEB) 0.5-2.5 mg/3 mL nebulizer solution 3 mL: 3 mL | RESPIRATORY_TRACT | @ 15:00:00

## 2019-12-13 MED ADMIN — vancomycin (VANCOCIN) IVPB 1000 mg (premix): 1000 mg | INTRAVENOUS | @ 13:00:00 | Stop: 2019-12-19

## 2019-12-13 MED ADMIN — cefepime (MAXIPIME) 2 g in dextrose 100 mL IVPB (premix): 2 g | INTRAVENOUS | @ 12:00:00 | Stop: 2019-12-16

## 2019-12-13 MED ADMIN — furosemide (LASIX) injection 40 mg: 40 mg | INTRAVENOUS | @ 18:00:00 | Stop: 2019-12-13

## 2019-12-13 MED ADMIN — metroNIDAZOLE (FLAGYL) IVPB 500 mg: 500 mg | INTRAVENOUS | @ 22:00:00 | Stop: 2019-12-21

## 2019-12-13 MED ADMIN — vancomycin (VANCOCIN) IVPB 1000 mg (premix): 1000 mg | INTRAVENOUS | @ 02:00:00 | Stop: 2019-12-19

## 2019-12-13 MED ADMIN — gabapentin (NEURONTIN) capsule 300 mg: 300 mg | ORAL | @ 12:00:00

## 2019-12-13 MED ADMIN — sildenafiL (pulm.hypertension) (REVATIO) tablet 10 mg: 10 mg | ORAL | @ 18:00:00

## 2019-12-13 MED ADMIN — ipratropium-albuteroL (DUO-NEB) 0.5-2.5 mg/3 mL nebulizer solution 3 mL: 3 mL | RESPIRATORY_TRACT | @ 01:00:00

## 2019-12-13 MED ADMIN — cefepime (MAXIPIME) 2 g in dextrose 100 mL IVPB (premix): 2 g | INTRAVENOUS | @ 01:00:00 | Stop: 2019-12-16

## 2019-12-13 MED ADMIN — sildenafiL (pulm.hypertension) (REVATIO) tablet 10 mg: 10 mg | ORAL | @ 12:00:00

## 2019-12-13 MED ADMIN — insulin lispro (HumaLOG) injection 0-12 Units: 0-12 [IU] | SUBCUTANEOUS | @ 10:00:00

## 2019-12-13 MED ADMIN — gabapentin (NEURONTIN) capsule 300 mg: 300 mg | ORAL | @ 01:00:00

## 2019-12-13 NOTE — Unmapped (Signed)
Medicine Consult Progress Note    Assessment/Plan:    Principal Problem:    Osteomyelitis of ankle and foot (CMS-HCC)  Active Problems:    Pulmonary sarcoidosis (CMS-HCC)    HTN (hypertension)    Type 2 diabetes mellitus with hyperglycemia (CMS-HCC)    Chronic respiratory failure with hypoxia (CMS-HCC)    Chronic pulmonary aspergillosis (CMS-HCC)    Pulmonary hypertension (CMS-HCC)  Resolved Problems:    * No resolved hospital problems. *                Karen Barber is a 54 y.o. female with sarcoidosis (dx in 1995), WHO class 5 pulmonary hypertension, aspergillus on suppressive posaconazole, HTN, T2DM that presented to [facility] with osteomyelitis of R 5th toe now s/p toe amputation on 09/24. Medicine was consulted for optimization of her other medical conditions.    Sarcoidosis c/b chronic hypoxemic respiratory failure  Diagnosed in 1995 by lip biopsy. Followed by Dr. Wellington Hampshire with [facility]mon[facility], but recently transferred to her care to [facility]. She had evidence of active sarcoidosis on most recent PET scan 11/19/18, however, there was concern for intensifying immunosuppression due to h/o pulmonary infections. Has only been treated with prednisone (80 mg daily) for several months and then tapered to 10 mg daily. She self discontinued steroids 3 weeks prior to admission. Has been seen in transplant clinic but was ultimately not a candidate due to her anatomy. She is using 5L  which is baseline, though interestingly her 6 min walk test 12/10/19 indicated that she only needed up to 4L with exercise.   -- Wean oxygen as tolerated for goal O2 sat >92%   -- No stress dose steroids indicated as patient has been off prednisone for weeks   - agree with attempted diuresis today given increased oxygen requirement overnight  - recommend outpatient sleep study for possible OSA??    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 Other pulmonary infections include Stenotrophomonas in 12/03/14, Achromobacter in 11/18/14 and 09/25/16. 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 She follows with [facility] and was recommended to continue suppressive posaconazole 200 mg daily. Last LFT check 9/23 were within normal range.   -- Continue posaconazole 200 mg daily  ??  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 Patient told [facility] pulmonology team that she discontinued sildenafil, though she tells me that she's still taking it.   -- Continue sildenafil 10 mg TID  ??  Asthma   - continue symbicort inhaler and Albuterol q4H PRN  ??  CKD stage 3  Baseline creatinine 1.09-1.2. Currently creatinine at baseline. Likely secondary to DM and HTN.   -- Daily BMP  - avoid nephrotoxins??    T2DM   Last hemoglobin A1c: 9.9. Current regimen includes metformin 1000 mg BID, lantus 27 units nightly and short acting insulin.   - defer management to Endocrinology  ??  Hypertension   Managed with hydrochlorothiazide 25 mg daily as outpatient.   - holding hydrochlorothiazide pre-operatively  - follow BPs to determine when appropriate to resume  ??  Right 5th toe osteomyelitis s/p amputation 9/24  Patient admitted for amputation after evaluation in podiatry clinic where she was found to have large ulceration and bone exposure on right 5th toe. X-ray confirmed osteomyelitis. She was empirically treated  with vanc/cefe/flagyl prior to amputation 9/24.  - defer management to primary team; given clear margins could stop vanc/cefepime/flagyl as have achieved source control  for empiric coverage  ??  For questions between 7:30AM-5PM, please page the Medicine Consult Service pager at [phone]. After 5PM, the Medicine Consult Service is covered by the [facility] On-Call Resident for urgent/emergent questions or concerns. ___________________________________________________________________    Subjective:  Desaturated overnight despite 5L oxygen. Denies h/o OSA or prior sleep study. Was using 5L as outpatient as had been told by Pulm MD to increase due to symptoms. Recent on 09/22 at [facility] with oxygen need of 2L at rest and 4L with exertion. Presently on 6L. Weaned to 4L during my evaluation with saturations > 92%. Feels worn out when she ambulates and did desaturate. Denies dyspnea, chest pain. Just didn't sleep well. Pain in foot manageable.     Labs/Studies:  Labs and Studies from the last 24hrs per EMR and Reviewed    Objective:  Temp:  [35.9 ??C (96.6 ??F)-36.9 ??C (98.4 ??F)] 36.9 ??C (98.4 ??F)  Heart Rate:  [94-108] 108  Resp:  [16-18] 18  BP: (133-140)/(77-83) 133/77  SpO2:  [78 %-99 %] 87 %    GEN: NAD, sitting up in bed; weaned to 4L with SpO2 > 92%  ENT: MMM   CV: normal rate, regular rhythm  PULM: normal WoB, inferior bilateral crackles   ABD: soft, +BS, NT/ND   EXT: warm, trace edema, symmetric pulses  SKIN: no rashes  Neuro: alert, fully oriented, no new deficits

## 2019-12-13 NOTE — Unmapped (Signed)
Pt is alert and oriented. VSS, pt using 6L O2 via [place]. O2 sats drop intermittently but have not sustained. Pt encouraged to take breaths in thru nose and use IS. Pt has no c/o ongoing pain or discomfort. Dsg to R foot CDI with post-op boot on. Pt uses bedside commode for toileting. Pt cont with ACHS with insulin admin. No acute events, bed is low and locked, all necessary items are within reach.     Problem: Adult Inpatient Plan of Care  Goal: Plan of Care Review  Outcome: Progressing  Flowsheets (Taken 12/13/2019 1451)  Progress: improving  Plan of Care Reviewed With:  ??? patient  ??? spouse  Goal: Patient-Specific Goal (Individualized)  Outcome: Progressing  Flowsheets (Taken 12/13/2019 1451)  Patient-Specific Goals (Include Timeframe): Pt will notify RN of any c/o ongoing SOB experienced during this shift  Individualized Care Needs:  ??? cluster care  ??? 6L O2 via [place]  ??? ACHS  ??? dsg to R foot CDI  Goal: Absence of Hospital-Acquired Illness or Injury  Outcome: Progressing  Intervention: Identify and Manage Fall Risk  Recent Flowsheet Documentation  Taken 12/13/2019 0800 by Rolm Gala, RN  Safety Interventions:  ??? commode/urinal/bedpan at bedside  ??? fall reduction program maintained  ??? family at bedside  ??? lighting adjusted for tasks/safety  ??? low bed  ??? nonskid shoes/slippers when out of bed  ??? room near unit station  Intervention: Prevent Skin Injury  Recent Flowsheet Documentation  Taken 12/13/2019 0815 by Rolm Gala, RN  Skin Protection:  ??? adhesive use limited  ??? incontinence pads utilized  Intervention: Prevent Infection  Recent Flowsheet Documentation  Taken 12/13/2019 0800 by Rolm Gala, RN  Infection Prevention:  ??? hand hygiene promoted  ??? rest/sleep promoted  Goal: Optimal Comfort and Wellbeing  Outcome: Progressing  Goal: Readiness for Transition of Care  Outcome: Progressing  Goal: Rounds/Family Conference  Outcome: Progressing     Problem: Impaired Wound Healing  Goal: Optimal Wound Healing  Outcome: Progressing

## 2019-12-13 NOTE — Unmapped (Signed)
SRV Progress Note    Admit Date: 12/11/2019, Hospital Day: 3  Hospital Service: Surg Vascular (SRV)  Attending: Deneise Lever, MD    Assessment   54 y.o. F w/ PMH of sarcoidosis, aspergillus PNA, LUL lobectomy in 2021, T2DM, who was admitted for worsening soft tissue wound in the right fifth toe with x-ray evidence of osteomyelitis. Patient w/ no hx of vascular disease. S/p Right 5th toe amputation on 9/24.    Interval Events/Subjective:  NAEON. Pain well controlled this AM. Right foot w/ dressings in place. Patient w/ increased O2 requirement to 7L [place] 2/2 SPO2 70-80s overnight. Patient denies SOB this AM.     Plan   Neuro:  - MM pain control    - Sch Gabapentin 300 mg TID   - PRN Tylenol 650 mg q4h    CVS:   - HDS    Pulm:   - NWOB on 7 L [place], titrate O2 to maintain SPO2 > 92%  - Sch Duoneb q6h, Albuterol inhaler q4h PRN  - Pulm toilet, IS  - CXR on 9/25 w/ extensive fibrotic changes and adenopathy and apical bullous disease.  - Plan for PO Lasix 40 BID today  - AM CXR on 9/26  - Continue posaconazole 200 mg daily    FEN/GI:   - Regular diet  - SSI    GU:   - Voiding spontaneously    Heme/ID:   - Vanc/Cef/Flagyl  - Surgical Cx pending    Disposition:   Floor    Please page SRV intern pager with questions or concerns.    Vitals:   Temp:  [35.9 ??C-36.9 ??C] 36.9 ??C  Heart Rate:  [94-108] 108  Resp:  [16-18] 18  BP: (133-140)/(77-83) 133/77  MAP (mmHg):  [95-106] 95  SpO2:  [78 %-99 %] 87 %    Intake/Output last 24 hours:    Intake/Output Summary (Last 24 hours) at 12/13/2019 1059  Last data filed at 12/13/2019 0600  Gross per 24 hour   Intake 1650 ml   Output 20 ml   Net 1630 ml       Physical Exam:  -General:  Appropriate and in no apparent distress.  -Neurological: Alert and oriented. Moves all 4 extremities spontaneously.   -Cardiovascular: RRR  -Pulmonary: NWOB on 5 L [place]  -Abdomen: Soft, non-tender, non-distended.   -Extremities: Right foot with bandages in place  -----------------------------------------------------    Data Review:  Labs and imaging reviewed past 24 hrs    Boykin Nearing, MD  General Surgery PGY-1

## 2019-12-13 NOTE — Unmapped (Signed)

## 2019-12-13 NOTE — Unmapped (Signed)
Endocrine Team Diabetes Follow Up Consult Note       Requesting Attending Physician : Deneise Lever, MD  Service Requesting Consult : Surg Vascular (SRV)  Primary Care Provider: Noralyn Pick, FNP    Assessment and Plan:  IMPRESSION:  Karen Barber is a 54 y.o. female admitted for right fifth toe amputation. We have been consulted at the request of Deneise Lever, MD to evaluate Karen Barber for hyperglycemia.       RECOMMENDATIONS:  1. Type 2 diabetes, uncontrolled: A1C 9.9%. Continued hyperglycemia despite adding basal bolus insulin yesterday. Will increase insulin.  - NPH 12 units q12h  - Lispro 8 units with meals   - hold if NPO. Adjust for PO intake.  - Lispro 2:50>150 achs    Other problems complicating glycemic control:  Variable PO intake and sarcoidosis and chronic prednisone use  Principal Problem:    Osteomyelitis of ankle and foot (CMS-HCC)  Active Problems:    Pulmonary sarcoidosis (CMS-HCC)    HTN (hypertension)    Type 2 diabetes mellitus with hyperglycemia (CMS-HCC)    Chronic respiratory failure with hypoxia (CMS-HCC)    Chronic pulmonary aspergillosis (CMS-HCC)    Pulmonary hypertension (CMS-HCC)    2. Nutrition: complicating glycemic control.   - currently NPO for surgery    Thank you for this consult. Communicated plan to primary team. We will continue to follow and make recommendations and place orders as appropriate.    Please page with questions or concerns: [place], [place]: [phone]  DCT on call from 6AM - 3PM on weekdays then endocrine fellow on call: [phone] from 3PM - 6AM on weekdays and on weekends and holidays.   If APP cannot be reached, please page the endocrine fellow on call.      Subjective:  Initial encounter HPI:  Karen Barber is a 54 y.o. female with pertinent past medical history of sarcoidosis, COPD, asthma, hypertension, and type 2 diabetes admitted for urgent right fifth toe amputation.  Patient has a history of sarcoidosis but it is stable, and there is no concern for sarcoid flare.  She is not currently on prednisone and states her home blood glucose levels have been better controlled recently. She stopped taking prednisone about 3 weeks ago. When taking prednisone her blood sugars were frequently in the 500s. Since stopping prednisone her blood sugars have been mostly in the 200s.     Diabetes History:  Patient has a history of Type 2 diabetes diagnosed several years ago when she started taking prednisone.  Diabetes is managed by: PCP.  Current home diabetes regimen:metformin 1000 mg BID, glargine 24 units nightly (titrating up by 3 units daily until she gets to 30 units total) and novolog sliding scale.  Current home blood glucose monitoring:  daily.  Hypoglycemia awareness: yes.  Complications related to diabetes: unknown     Patient does not like hospital food so only nibbled yesterday. Denies nausea and vomiting. She did not take any long acting insulin yesterday.     Interval history:  Karen Barber reports she has some mild pain this morning. She ranks it 2/10 in her right foot. She reports decreased appetite, but that she plans to eat meals today. She is worried about her blood sugar which has led to skipping some meals.    Current Diabetes Inpatient Regimen:  - NPH 10 q12  - Humalog (Lispro) 7ac + 2:50>150 achs    Current Nutrition:  Active Orders   Diet  Nutrition Therapy Consistent Carb; Consistent Carb 60/60/60 (4/4/4)       ROS: As per history.    ??? Cefepime  2 g Intravenous Q12H   ??? gabapentin  300 mg Oral TID   ??? insulin lispro  0-12 Units Subcutaneous ACHS   ??? insulin lispro  7 Units Subcutaneous TID AC   ??? insulin NPH  10 Units Subcutaneous Q12H [facility]   ??? ipratropium-albuteroL  3 mL Nebulization Q6H (RT)   ??? metronidazole  500 mg Intravenous Q8H   ??? pravastatin  40 mg Oral Daily   ??? sildenafiL (pulm.hypertension)  10 mg Oral TID   ??? vancomycin  1,000 mg Intravenous Q12H [facility]       Past Medical History:   Diagnosis Date   ??? 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 Breast injury     lung surg on left incision under breast sept 2016   ??? Caregiver burden     parents    ??? Hepatitis C antibody test positive 2016    repeatedly negative HCV RNA, indicating clearance of infection w/o treatment   ??? Hyperlipidemia    ??? Hypertension    ??? 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 On home O2 2008    per Dr Mal Amabile note from 02/21/2017   ??? Pulmonary aspergillosis (CMS-HCC) 2016    A.fumigatus in 2016, prior to LUL lobectomy. A.niger from sputum 08/2016-09/2016.   ??? 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 Sarcoidosis 1995   ??? Type 2 diabetes mellitus with hyperglycemia (CMS-HCC) 07/27/2014   ??? Visual impairment     glasses       Past Surgical History:   Procedure Laterality Date   ??? LUNG LOBECTOMY     ??? PR RIGHT HEART CATH O2 SATURATION & CARDIAC OUTPUT N/A 04/29/2019    Procedure: Right Heart Catheterization;  Surgeon: Rosana Hoes, MD;  Location: [facility] CATH;  Service: Cardiology   ??? PR THORACOSCOPY SURG TOT PULM DECORT Left 12/14/2014    Procedure: THORACOSCOPY SURG; W/TOT PULM DECORTIC/PNEUMOLYS;  Surgeon: Evert Kohl, MD;  Location: MAIN OR [facility];  Service: Cardiothoracic   ??? PR THORACOSCOPY W/THERA WEDGE RESEXN INITIAL UNILAT Left 12/03/2014    Procedure: THORACOSCOPY, SURGICAL; WITH THERAPEUTIC WEDGE RESECTION (EG, MASS, NODULE) INITIAL UNILATERAL;  Surgeon: Evert Kohl, MD;  Location: MAIN OR [facility];  Service: Cardiothoracic       Family History   Problem Relation Age of Onset   ??? Diabetes Father    ??? Diabetes Brother    ??? Diabetes Maternal Aunt    ??? Sarcoidosis Maternal Aunt    ??? Diabetes Maternal Uncle    ??? Diabetes Paternal Aunt    ??? Diabetes Paternal Uncle        Social History     Tobacco Use   ??? Smoking status: Former Smoker     Packs/day: 0.50     Years: 9.00     Pack years: 4.50     Types: Cigarettes     Quit date: 05/03/1987     Years since quitting: 32.6   ??? Smokeless tobacco: Never Used   Vaping Use   ??? Vaping Use: Never used   Substance Use Topics   ??? Alcohol use: No     Alcohol/week: 0.0 standard drinks   ??? Drug use: No  OBJECTIVE:  BP 134/77  - Pulse 98  - Temp 36.7 ??C (98 ??F) (Oral)  - Resp 18  - Wt (!) 110.9 kg (244 lb 8 oz)  - LMP  (LMP Unknown)  - SpO2 96%  - BMI 34.12 kg/m??   Wt Readings from Last 12 Encounters:   12/12/19 (!) 110.9 kg (244 lb 8 oz)   11/11/19 (!) 109.3 kg (241 lb)   08/22/19 (!) 104.3 kg (230 lb)   08/07/19 100.2 kg (221 lb)   05/30/19 (!) 107.6 kg (237 lb 4.8 oz)   05/29/19 (!) 109.8 kg (242 lb)   05/13/19 (!) 102.1 kg (225 lb)   04/29/19 (!) 107 kg (236 lb)   01/24/19 (!) 103.4 kg (228 lb)   01/08/19 (!) 103.4 kg (228 lb)   11/18/18 95.3 kg (210 lb)   11/01/18 99.8 kg (220 lb)     Physical Exam  Constitutional:       General: She is not in acute distress.     Appearance: Normal appearance. She is not ill-appearing, toxic-appearing or diaphoretic.   Cardiovascular:      Rate and Rhythm: Normal rate and regular rhythm.      Heart sounds: No murmur heard.     Pulmonary:      Effort: Pulmonary effort is normal. No respiratory distress.   Abdominal:      General: Bowel sounds are normal. There is no distension.      Palpations: Abdomen is soft. There is no mass.   Neurological:      General: No focal deficit present.      Mental Status: She is alert.   Psychiatric:         Mood and Affect: Mood normal.         Behavior: Behavior normal.       Data Review    BG/insulin reviewed per EMR.   Glucose, POC (mg/dL)   Date Value   16/12/9602 259 (H)   12/12/2019 370 (H)   12/12/2019 262 (H)   12/12/2019 215 (H)   12/12/2019 247 (H)   12/11/2019 245 (H)   12/11/2019 246 (H)   12/09/2018 177   07/03/2014 161   07/03/2014 161   07/02/2014 243 (H)   07/02/2014 131   07/02/2014 202 (H)   07/02/2014 123   07/01/2014 251 (H)   07/01/2014 207 (H)        Summary of labs:  Lab Results   Component Value Date    A1C 10.1 (H) 12/12/2019    A1C 9.9 (A) 11/11/2019    A1C 9.7 (A) 05/29/2019     Lab Results   Component Value Date    GFR >= 60 05/31/2011    CREATININE 1.28 (H) 12/12/2019     Lab Results   Component Value Date    WBC 5.9 12/12/2019    HGB 11.4 (L) 12/12/2019    HCT 37.0 12/12/2019    PLT 324 12/12/2019       Lab Results   Component Value Date    NA 136 12/12/2019    K 4.3 12/12/2019    CL 101 12/12/2019    CO2 28.0 12/12/2019    BUN 17 12/12/2019    CREATININE 1.28 (H) 12/12/2019    GLU 238 (H) 12/12/2019    CALCIUM 9.0 12/12/2019    MG 1.6 12/12/2019    PHOS 4.1 12/12/2019       Lab Results   Component Value Date    BILITOT  0.5 12/11/2019    BILIDIR <0.10 05/30/2019    PROT 7.1 12/11/2019    ALBUMIN 3.3 (L) 12/11/2019    ALT 15 12/11/2019    AST 16 12/11/2019    ALKPHOS 88 12/11/2019    GGT 60 (H) 05/31/2011       Lab Results   Component Value Date    INR 1.02 12/11/2019    APTT 30.9 12/11/2019

## 2019-12-13 NOTE — Unmapped (Signed)
Vancomycin Therapeutic Monitoring Pharmacy Note    Karen Barber is a 54 y.o. female starting vancomycin. Date of therapy initiation: 12/11/19    Indication: Osteomyelitis    Prior Dosing Information: Current regimen vancomycin 1000 mg IV q12h      Goals:  Therapeutic Drug Levels  Vancomycin trough goal: 15-20 mg/L    Additional Clinical Monitoring/Outcomes  Renal function, volume status (intake and output)    Results: Vancomycin level: 19.2 mg/L, drawn ~0.5 hours early (true trough 18.6 mg/L)    Wt Readings from Last 1 Encounters:   12/12/19 (!) 110.9 kg (244 lb 8 oz)     Creatinine   Date Value Ref Range Status   12/13/2019 1.28 (H) 0.55 - 1.02 mg/dL Final   16/12/9602 5.40 (H) 0.55 - 1.02 mg/dL Final   98/01/9146 8.29 (H) 0.55 - 1.02 mg/dL Final        Pharmacokinetic Considerations and Significant Drug Interactions:  ??? Adult (calculated on 12/13/19): Vd = 56.722 L, ke = 0.0565 hr-1  ??? Concurrent nephrotoxic meds: not applicable    Assessment/Plan:  Recommendation(s)  ??? Continue current regimen of vancomycin 1000 mg IV q12h  ??? Estimated trough on recommended regimen: 19 mg/L    Follow-up  ??? Level due: prior to fourth or fifth dose  ??? A pharmacist will continue to monitor and order levels as appropriate    Please page service pharmacist with questions/clarifications.    Jasiyah Poland Newman Nickels, PharmD

## 2019-12-14 LAB — CBC
HEMATOCRIT: 38.1 % (ref 36.0–46.0)
HEMOGLOBIN: 11.6 g/dL — ABNORMAL LOW (ref 12.0–16.0)
MEAN CORPUSCULAR HEMOGLOBIN CONC: 30.5 g/dL — ABNORMAL LOW (ref 31.0–37.0)
MEAN CORPUSCULAR HEMOGLOBIN: 26.3 pg (ref 26.0–34.0)
MEAN CORPUSCULAR VOLUME: 86.4 fL (ref 80.0–100.0)
PLATELET COUNT: 332 10*9/L (ref 150–440)
RED CELL DISTRIBUTION WIDTH: 14.2 % (ref 12.0–15.0)
WBC ADJUSTED: 8.5 10*9/L (ref 4.5–11.0)

## 2019-12-14 LAB — BASIC METABOLIC PANEL
ANION GAP: <1 mmol/L — ABNORMAL LOW (ref 5–14)
CALCIUM: 9.5 mg/dL (ref 8.7–10.4)
CHLORIDE: 96 mmol/L — ABNORMAL LOW (ref 98–107)
CO2: 35 mmol/L — ABNORMAL HIGH (ref 20.0–31.0)
CREATININE: 1.27 mg/dL — ABNORMAL HIGH
EGFR CKD-EPI AA FEMALE: 55 mL/min/{1.73_m2} — ABNORMAL LOW (ref >=60)
GLUCOSE RANDOM: 249 mg/dL — ABNORMAL HIGH (ref 70–179)
POTASSIUM: 5 mmol/L — ABNORMAL HIGH (ref 3.4–4.5)
SODIUM: 131 mmol/L — ABNORMAL LOW (ref 135–145)

## 2019-12-14 LAB — MAGNESIUM: Magnesium:MCnc:Pt:Ser/Plas:Qn:: 1.9

## 2019-12-14 LAB — MEAN CORPUSCULAR HEMOGLOBIN CONC: Erythrocyte mean corpuscular hemoglobin concentration:MCnc:Pt:RBC:Qn:Automated count: 30.5 — ABNORMAL LOW

## 2019-12-14 LAB — CREATININE: Creatinine:MCnc:Pt:Ser/Plas:Qn:: 1.27 — ABNORMAL HIGH

## 2019-12-14 LAB — PHOSPHORUS: Phosphate:MCnc:Pt:Ser/Plas:Qn:: 2.5

## 2019-12-14 MED ADMIN — insulin lispro (HumaLOG) injection 8 Units: 8 [IU] | SUBCUTANEOUS | @ 20:00:00

## 2019-12-14 MED ADMIN — cefepime (MAXIPIME) 2 g in dextrose 100 mL IVPB (premix): 2 g | INTRAVENOUS | @ 02:00:00 | Stop: 2019-12-16

## 2019-12-14 MED ADMIN — ipratropium-albuteroL (DUO-NEB) 0.5-2.5 mg/3 mL nebulizer solution 3 mL: 3 mL | RESPIRATORY_TRACT | @ 09:00:00

## 2019-12-14 MED ADMIN — acetaminophen (TYLENOL) tablet 650 mg: 650 mg | ORAL | @ 12:00:00

## 2019-12-14 MED ADMIN — insulin lispro (HumaLOG) injection 0-12 Units: 0-12 [IU] | SUBCUTANEOUS | @ 02:00:00

## 2019-12-14 MED ADMIN — sildenafiL (pulm.hypertension) (REVATIO) tablet 10 mg: 10 mg | ORAL | @ 12:00:00

## 2019-12-14 MED ADMIN — cefepime (MAXIPIME) 2 g in dextrose 100 mL IVPB (premix): 2 g | INTRAVENOUS | @ 12:00:00 | Stop: 2019-12-16

## 2019-12-14 MED ADMIN — gabapentin (NEURONTIN) capsule 300 mg: 300 mg | ORAL | @ 18:00:00

## 2019-12-14 MED ADMIN — pravastatin (PRAVACHOL) tablet 40 mg: 40 mg | ORAL | @ 21:00:00

## 2019-12-14 MED ADMIN — sildenafiL (pulm.hypertension) (REVATIO) tablet 10 mg: 10 mg | ORAL | @ 18:00:00

## 2019-12-14 MED ADMIN — vancomycin (VANCOCIN) IVPB 1000 mg (premix): 1000 mg | INTRAVENOUS | @ 13:00:00 | Stop: 2019-12-19

## 2019-12-14 MED ADMIN — gabapentin (NEURONTIN) capsule 300 mg: 300 mg | ORAL | @ 02:00:00

## 2019-12-14 MED ADMIN — metroNIDAZOLE (FLAGYL) IVPB 500 mg: 500 mg | INTRAVENOUS | @ 06:00:00 | Stop: 2019-12-21

## 2019-12-14 MED ADMIN — insulin lispro (HumaLOG) injection 0-12 Units: 0-12 [IU] | SUBCUTANEOUS | @ 14:00:00

## 2019-12-14 MED ADMIN — gabapentin (NEURONTIN) capsule 300 mg: 300 mg | ORAL | @ 12:00:00

## 2019-12-14 MED ADMIN — ipratropium-albuteroL (DUO-NEB) 0.5-2.5 mg/3 mL nebulizer solution 3 mL: 3 mL | RESPIRATORY_TRACT | @ 21:00:00

## 2019-12-14 MED ADMIN — insulin lispro (HumaLOG) injection 8 Units: 8 [IU] | SUBCUTANEOUS | @ 14:00:00

## 2019-12-14 MED ADMIN — insulin NPH (HumuLIN,NovoLIN) injection 12 Units: 12 [IU] | SUBCUTANEOUS | @ 02:00:00

## 2019-12-14 MED ADMIN — ipratropium-albuteroL (DUO-NEB) 0.5-2.5 mg/3 mL nebulizer solution 3 mL: 3 mL | RESPIRATORY_TRACT | @ 02:00:00

## 2019-12-14 NOTE — Unmapped (Signed)
Pt sleeping. SO at bedside. No signs or c/o pain or acute distress. Respirations even and unlabored on 5L [place]. Dressing to Right foot CDI with post-op boot on. Fall and safety precautions in place, call light and personal items within reach.       Problem: Asthma Comorbidity  Goal: Maintenance of Asthma Control  Outcome: Ongoing - Unchanged     Problem: Diabetes Comorbidity  Goal: Blood Glucose Level Within Targeted Range  Outcome: Ongoing - Unchanged     Problem: Adult Inpatient Plan of Care  Goal: Plan of Care Review  Outcome: Progressing  Goal: Patient-Specific Goal (Individualized)  Outcome: Progressing  Goal: Absence of Hospital-Acquired Illness or Injury  Outcome: Progressing  Intervention: Identify and Manage Fall Risk  Recent Flowsheet Documentation  Taken 12/13/2019 2000 by Julieanne Cotton, RN  Safety Interventions:   commode/urinal/bedpan at bedside   fall reduction program maintained   lighting adjusted for tasks/safety   low bed   nonskid shoes/slippers when out of bed  Intervention: Prevent and Manage VTE (Venous Thromboembolism) Risk  Recent Flowsheet Documentation  Taken 12/13/2019 2000 by Julieanne Cotton, RN  Activity Management: activity adjusted per tolerance  Goal: Optimal Comfort and Wellbeing  Outcome: Progressing  Goal: Readiness for Transition of Care  Outcome: Progressing  Goal: Rounds/Family Conference  Outcome: Progressing     Problem: Impaired Wound Healing  Goal: Optimal Wound Healing  Outcome: Progressing  Intervention: Promote Wound Healing  Recent Flowsheet Documentation  Taken 12/13/2019 2000 by Julieanne Cotton, RN  Activity Management: activity adjusted per tolerance     Problem: Hypertension Comorbidity  Goal: Blood Pressure in Desired Range  Outcome: Progressing     Problem: Fall Injury Risk  Goal: Absence of Fall and Fall-Related Injury  Outcome: Progressing  Intervention: Promote Injury-Free Environment  Recent Flowsheet Documentation  Taken 12/13/2019 2000 by Julieanne Cotton, RN  Safety Interventions:   commode/urinal/bedpan at bedside   fall reduction program maintained   lighting adjusted for tasks/safety   low bed   nonskid shoes/slippers when out of bed

## 2019-12-14 NOTE — Unmapped (Signed)
Medicine Consult Progress Note    Assessment/Plan:    Principal Problem:    Osteomyelitis of ankle and foot (CMS-HCC)  Active Problems:    Pulmonary sarcoidosis (CMS-HCC)    HTN (hypertension)    Type 2 diabetes mellitus with hyperglycemia (CMS-HCC)    Chronic respiratory failure with hypoxia (CMS-HCC)    Chronic pulmonary aspergillosis (CMS-HCC)    Pulmonary hypertension (CMS-HCC)  Resolved Problems:    * No resolved hospital problems. *                Karen Barber is a 54 y.o. female with sarcoidosis (dx in 1995), WHO class 5 pulmonary hypertension, aspergillus on suppressive posaconazole, HTN, T2DM that presented to [facility] with osteomyelitis of R 5th toe now s/p toe amputation on 09/24. Medicine was consulted for optimization of her other medical conditions.    Sarcoidosis c/b chronic hypoxemic respiratory failure  Diagnosed in 1995 by lip biopsy. Followed by Dr. Wellington Hampshire with [facility] Pulmonology, but recently transferred to her care to [facility]. She had evidence of active sarcoidosis on most recent PET scan 11/19/18, however, there was concern for intensifying immunosuppression due to h/o pulmonary infections. Has only been treated with prednisone (80 mg daily) for several months and then tapered to 10 mg daily. She self discontinued steroids 3 weeks prior to admission. Has been seen in transplant clinic but was ultimately not a candidate due to her anatomy. She is using 5L [place] which is baseline, though interestingly her 6 min walk test 12/10/19 indicated that she only needed up to 4L with exercise.   - Wean oxygen as tolerated for goal O2 sat >88%   - No stress dose steroids indicated as patient has been off prednisone for weeks   - No diuresis today as BMP suggests contraction alkalosis   - recommend outpatient sleep study for possible OSA??    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 Other pulmonary infections include Stenotrophomonas in 12/03/14, Achromobacter in 11/18/14 and 09/25/16. 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 She follows with [facility] ID and was recommended to continue suppressive posaconazole 200 mg daily. Last LFT check 9/23 were within normal range.   -- Continue posaconazole 200 mg daily  ??  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 Patient told [facility] pulmonology team that she discontinued sildenafil, though she tells me that she's still taking it.   -- Continue sildenafil 10 mg TID  ??  Asthma   - continue symbicort inhaler and Albuterol q4H PRN  ??  CKD stage 3  Baseline creatinine 1.09-1.2. Currently creatinine at baseline. Likely secondary to DM and HTN.   -- Daily BMP  - avoid nephrotoxins??    T2DM   Last hemoglobin A1c: 9.9. Current regimen includes metformin 1000 mg BID, lantus 27 units nightly and short acting insulin.   - defer management to Endocrinology  ??  Hypertension   Managed with hydrochlorothiazide 25 mg daily as outpatient.   - holding hydrochlorothiazide pre-operatively  - follow BPs to determine when appropriate to resume  ??  Right 5th toe osteomyelitis s/p amputation 9/24  Patient admitted for amputation after evaluation in podiatry clinic where she was found to have large ulceration and bone exposure on right 5th toe. X-ray confirmed osteomyelitis. She was empirically treated with  vanc/cefe/flagyl prior to amputation 9/24.  - defer management to primary team; given clear margins could stop vanc/cefepime/flagyl as have achieved source control  for empiric coverage  ??  For questions between 7:30AM-5PM, please page the Medicine Consult Service pager at [phone]. After 5PM, the Medicine Consult Service is covered by the [facility] On-Call Resident for urgent/emergent questions or concerns. ___________________________________________________________________    Subjective:  No overnight events. Remains on 5-6L oxygen. Had 3L UOP after Lasix 40mg  IV BID. No new symptoms just worried about her oxygen requirements. Feeling better when ambulating in terms of her breathing. No presyncope or dizziness. Minimal pain in foot at surgical site.     Labs/Studies:  Labs and Studies from the last 24hrs per EMR and Reviewed    Objective:  Temp:  [36.5 ??C (97.7 ??F)-36.7 ??C (98.1 ??F)] 36.5 ??C (97.7 ??F)  Heart Rate:  [99-126] 126  Resp:  [16-20] 16  BP: (126-150)/(76-81) 126/80  SpO2:  [72 %-100 %] 95 %    GEN: NAD, sitting up in bed; weaned to 4L with SpO2 > 92%  ENT: MMM   CV: normal rate, regular rhythm  PULM: normal WoB, improved inferior bilateral crackles   ABD: soft, +BS, NT/ND   EXT: warm, trace edema, symmetric pulses  SKIN: no rashes  Neuro: alert, fully oriented, no new deficits

## 2019-12-14 NOTE — Unmapped (Signed)
Endocrine Team Diabetes Follow Up Consult Note       Requesting Attending Physician : Deneise Lever, MD  Service Requesting Consult : Surg Vascular (SRV)  Primary Care Provider: Noralyn Pick, FNP    Assessment and Plan:  IMPRESSION:  Karen Barber is a 54 y.o. female admitted for right fifth toe amputation. We have been consulted at the request of Deneise Lever, MD to evaluate Karen Barber for hyperglycemia.       RECOMMENDATIONS:  1. Type 2 diabetes, uncontrolled: A1C 9.9%. Additional hyperglycemia despite continued up titration of insulin. Will increase insulin further.  - NPH 14 units q12h  - Lispro 8 units with meals   - hold if NPO. Adjust for PO intake.  - Lispro 2:50>150 achs    Other problems complicating glycemic control:  Variable PO intake and sarcoidosis and chronic prednisone use  Principal Problem:    Osteomyelitis of ankle and foot (CMS-HCC)  Active Problems:    Pulmonary sarcoidosis (CMS-HCC)    HTN (hypertension)    Type 2 diabetes mellitus with hyperglycemia (CMS-HCC)    Chronic respiratory failure with hypoxia (CMS-HCC)    Chronic pulmonary aspergillosis (CMS-HCC)    Pulmonary hypertension (CMS-HCC)    2. Nutrition: complicating glycemic control.   - currently NPO for surgery    Thank you for this consult. Communicated plan to primary team. We will continue to follow and make recommendations and place orders as appropriate.    Please page with questions or concerns: [place], [place]: [phone]  DCT on call from 6AM - 3PM on weekdays then endocrine fellow on call: [phone] from 3PM - 6AM on weekdays and on weekends and holidays.   If APP cannot be reached, please page the endocrine fellow on call.      Subjective:  Initial encounter HPI:  Karen Barber is a 54 y.o. female with pertinent past medical history of sarcoidosis, COPD, asthma, hypertension, and type 2 diabetes admitted for urgent right fifth toe amputation.  Patient has a history of sarcoidosis but it is stable, and there is no concern for sarcoid flare.  She is not currently on prednisone and states her home blood glucose levels have been better controlled recently. She stopped taking prednisone about 3 weeks ago. When taking prednisone her blood sugars were frequently in the 500s. Since stopping prednisone her blood sugars have been mostly in the 200s.     Diabetes History:  Patient has a history of Type 2 diabetes diagnosed several years ago when she started taking prednisone.  Diabetes is managed by: PCP.  Current home diabetes regimen:metformin 1000 mg BID, glargine 24 units nightly (titrating up by 3 units daily until she gets to 30 units total) and novolog sliding scale.  Current home blood glucose monitoring:  daily.  Hypoglycemia awareness: yes.  Complications related to diabetes: unknown     Patient does not like hospital food so only nibbled yesterday. Denies nausea and vomiting. She did not take any long acting insulin yesterday.     Interval history:  Karen Barber reports no acute complaints. She is not eating much because she is worried about hyperglycemia. She denies pain. She denies nausea or vomiting.    Current Diabetes Inpatient Regimen:  - NPH 12 q12  - Humalog (Lispro) 8ac + 2:50>150 achs    Current Nutrition:  Active Orders   Diet    Nutrition Therapy Consistent Carb; Consistent Carb 60/60/60 (4/4/4)       ROS: As per history.    ???  Cefepime  2 g Intravenous Q12H   ??? gabapentin  300 mg Oral TID   ??? insulin lispro  0-12 Units Subcutaneous ACHS   ??? insulin lispro  8 Units Subcutaneous TID AC   ??? insulin NPH  12 Units Subcutaneous Q12H [facility]   ??? ipratropium-albuteroL  3 mL Nebulization Q6H (RT)   ??? metronidazole  500 mg Intravenous Q8H   ??? posaconazole  200 mg Oral Daily   ??? pravastatin  40 mg Oral Daily   ??? sildenafiL (pulm.hypertension)  10 mg Oral TID   ??? vancomycin  1,000 mg Intravenous Q12H [facility]       Past Medical History:   Diagnosis Date   ??? 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 Breast injury     lung surg on left incision under breast sept 2016   ??? Caregiver burden     parents    ??? Hepatitis C antibody test positive 2016    repeatedly negative HCV RNA, indicating clearance of infection w/o treatment   ??? Hyperlipidemia    ??? Hypertension    ??? 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 On home O2 2008    per Dr Mal Amabile note from 02/21/2017   ??? Pulmonary aspergillosis (CMS-HCC) 2016    A.fumigatus in 2016, prior to LUL lobectomy. A.niger from sputum 08/2016-09/2016.   ??? 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 Sarcoidosis 1995   ??? Type 2 diabetes mellitus with hyperglycemia (CMS-HCC) 07/27/2014   ??? Visual impairment     glasses       Past Surgical History:   Procedure Laterality Date   ??? LUNG LOBECTOMY     ??? PR RIGHT HEART CATH O2 SATURATION & CARDIAC OUTPUT N/A 04/29/2019    Procedure: Right Heart Catheterization;  Surgeon: Rosana Hoes, MD;  Location: [facility] CATH;  Service: Cardiology   ??? PR THORACOSCOPY SURG TOT PULM DECORT Left 12/14/2014    Procedure: THORACOSCOPY SURG; W/TOT PULM DECORTIC/PNEUMOLYS;  Surgeon: Evert Kohl, MD;  Location: MAIN OR [facility];  Service: Cardiothoracic   ??? PR THORACOSCOPY W/THERA WEDGE RESEXN INITIAL UNILAT Left 12/03/2014    Procedure: THORACOSCOPY, SURGICAL; WITH THERAPEUTIC WEDGE RESECTION (EG, MASS, NODULE) INITIAL UNILATERAL;  Surgeon: Evert Kohl, MD;  Location: MAIN OR [facility];  Service: Cardiothoracic       Family History   Problem Relation Age of Onset   ??? Diabetes Father    ??? Diabetes Brother    ??? Diabetes Maternal Aunt    ??? Sarcoidosis Maternal Aunt    ??? Diabetes Maternal Uncle    ??? Diabetes Paternal Aunt    ??? Diabetes Paternal Uncle        Social History     Tobacco Use   ??? Smoking status: Former Smoker     Packs/day: 0.50     Years: 9.00     Pack years: 4.50     Types: Cigarettes     Quit date: 05/03/1987     Years since quitting: 32.6   ??? Smokeless tobacco: Never Used   Vaping Use   ??? Vaping Use: Never used   Substance Use Topics   ??? Alcohol use: No     Alcohol/week: 0.0 standard drinks   ??? Drug use: No       OBJECTIVE:  BP 150/76  - Pulse 99  - Temp 36.6 ??C (  97.9 ??F) (Oral)  - Resp 17  - Wt (!) 108.4 kg (239 lb)  - LMP  (LMP Unknown)  - SpO2 100%  - BMI 33.35 kg/m??   Wt Readings from Last 12 Encounters:   12/13/19 (!) 108.4 kg (239 lb)   11/11/19 (!) 109.3 kg (241 lb)   08/22/19 (!) 104.3 kg (230 lb)   08/07/19 100.2 kg (221 lb)   05/30/19 (!) 107.6 kg (237 lb 4.8 oz)   05/29/19 (!) 109.8 kg (242 lb)   05/13/19 (!) 102.1 kg (225 lb)   04/29/19 (!) 107 kg (236 lb)   01/24/19 (!) 103.4 kg (228 lb)   01/08/19 (!) 103.4 kg (228 lb)   11/18/18 95.3 kg (210 lb)   11/01/18 99.8 kg (220 lb)     Physical Exam  Constitutional:       General: She is not in acute distress.     Appearance: Normal appearance. She is not ill-appearing, toxic-appearing or diaphoretic.   Cardiovascular:      Rate and Rhythm: Normal rate and regular rhythm.      Heart sounds: No murmur heard.     Pulmonary:      Effort: Pulmonary effort is normal. No respiratory distress.      Breath sounds: Normal breath sounds.   Abdominal:      General: Bowel sounds are normal.      Palpations: Abdomen is soft.      Tenderness: There is abdominal tenderness.      Comments: Mild tenderness to deep palpation in the upper area of her abdomen.   Neurological:      General: No focal deficit present.      Mental Status: She is alert and oriented to person, place, and time.   Psychiatric:         Mood and Affect: Mood normal.         Behavior: Behavior normal.          Data Review    BG/insulin reviewed per EMR.   Glucose, POC (mg/dL)   Date Value   16/12/9602 241 (H)   12/13/2019 247 (H)   12/13/2019 244 (H)   12/13/2019 236 (H)   12/13/2019 259 (H)   12/12/2019 370 (H)   12/12/2019 262 (H)   12/12/2019 215 (H)   07/03/2014 161   07/03/2014 161   07/02/2014 243 (H)   07/02/2014 131   07/02/2014 202 (H) 07/02/2014 123   07/01/2014 251 (H)   07/01/2014 207 (H)        Summary of labs:  Lab Results   Component Value Date    A1C 10.1 (H) 12/12/2019    A1C 9.9 (A) 11/11/2019    A1C 9.7 (A) 05/29/2019     Lab Results   Component Value Date    GFR >= 60 05/31/2011    CREATININE 1.28 (H) 12/13/2019     Lab Results   Component Value Date    WBC 8.3 12/13/2019    HGB 11.8 (L) 12/13/2019    HCT 39.4 12/13/2019    PLT 297 12/13/2019       Lab Results   Component Value Date    NA 137 12/13/2019    K 4.5 12/13/2019    CL 100 12/13/2019    CO2 28.0 12/13/2019    BUN 14 12/13/2019    CREATININE 1.28 (H) 12/13/2019    GLU 253 (H) 12/13/2019    CALCIUM 9.4 12/13/2019    MG 1.5 (L) 12/13/2019  PHOS 3.6 12/13/2019       Lab Results   Component Value Date    BILITOT 0.5 12/11/2019    BILIDIR <0.10 05/30/2019    PROT 7.1 12/11/2019    ALBUMIN 3.3 (L) 12/11/2019    ALT 15 12/11/2019    AST 16 12/11/2019    ALKPHOS 88 12/11/2019    GGT 60 (H) 05/31/2011       Lab Results   Component Value Date    INR 1.02 12/11/2019    APTT 30.9 12/11/2019

## 2019-12-14 NOTE — Unmapped (Signed)
SRV Progress Note    Admit Date: 12/11/2019, Hospital Day: 4  Hospital Service: Surg Vascular (SRV)  Attending: Deneise Lever, MD    Assessment   54 y.o. F w/ PMH of sarcoidosis, aspergillus PNA, LUL lobectomy in 2021, T2DM, who was admitted for worsening soft tissue wound in the right fifth toe with x-ray evidence of osteomyelitis. Patient w/ no hx of vascular disease. S/p Right 5th toe amputation on 9/24.    Interval Events/Subjective:  NAEON. Patient w/ desaturations to the 70-80s overnight but currently on 5L [place] w/ SPO2 > 95%. Pain well controlled this AM.     Plan   Neuro:  - MM pain control    - Sch Gabapentin 300 mg TID   - PRN Tylenol 650 mg q4h    CVS:   - HDS    Pulm:   - NWOB on 5L [place], titrate O2 to maintain SPO2 > 92%  - Posaconazole 40 mg BID   - Sch Duoneb q6h, Albuterol inhaler q4h PRN  - F/u on AM CXR  - Plan for PO Lasix 40 BID today if worsening resp status  - Pulm toilet, IS      FEN/GI:   - Regular diet  - SSI    GU:   - Voiding spontaneously    Heme/ID:   - Vanc/Cef/Flagyl  - Surgical Cx pending    Disposition:   Floor    Please page SRV intern pager with questions or concerns.    Vitals:   Temp:  [36.5 ??C-36.9 ??C] 36.5 ??C  Heart Rate:  [99-126] 126  Resp:  [16-20] 16  BP: (126-150)/(76-81) 126/80  MAP (mmHg):  [94-102] 94  SpO2:  [70 %-100 %] 95 %    Intake/Output last 24 hours:    Intake/Output Summary (Last 24 hours) at 12/14/2019 1610  Last data filed at 12/14/2019 0259  Gross per 24 hour   Intake 1110 ml   Output 2500 ml   Net -1390 ml       Physical Exam:  -General:  Appropriate and in no apparent distress.  -Neurological: Alert and oriented. Moves all 4 extremities spontaneously.   -Cardiovascular: RRR  -Pulmonary: NWOB on 5 L [place]  -Abdomen: Soft, non-tender, non-distended.   -Extremities: Right foot with bandages in place  -----------------------------------------------------    Data Review:  Labs and imaging reviewed past 24 hrs    Boykin Nearing, MD  General Surgery PGY-1

## 2019-12-15 LAB — CBC
HEMATOCRIT: 36.2 % (ref 36.0–46.0)
HEMOGLOBIN: 10.9 g/dL — ABNORMAL LOW (ref 12.0–16.0)
MEAN CORPUSCULAR HEMOGLOBIN CONC: 30.2 g/dL — ABNORMAL LOW (ref 31.0–37.0)
MEAN CORPUSCULAR HEMOGLOBIN: 26.3 pg (ref 26.0–34.0)
MEAN PLATELET VOLUME: 9.4 fL (ref 7.0–10.0)
PLATELET COUNT: 288 10*9/L (ref 150–440)
RED BLOOD CELL COUNT: 4.17 10*12/L (ref 4.00–5.20)
RED CELL DISTRIBUTION WIDTH: 14.3 % (ref 12.0–15.0)

## 2019-12-15 LAB — BASIC METABOLIC PANEL
ANION GAP: 4 mmol/L — ABNORMAL LOW (ref 5–14)
BLOOD UREA NITROGEN: 21 mg/dL (ref 9–23)
BUN / CREAT RATIO: 15
CALCIUM: 9.4 mg/dL (ref 8.7–10.4)
CO2: 34 mmol/L — ABNORMAL HIGH (ref 20.0–31.0)
CREATININE: 1.41 mg/dL — ABNORMAL HIGH
EGFR CKD-EPI AA FEMALE: 48 mL/min/{1.73_m2} — ABNORMAL LOW (ref >=60)
EGFR CKD-EPI NON-AA FEMALE: 42 mL/min/{1.73_m2} — ABNORMAL LOW (ref >=60)
GLUCOSE RANDOM: 162 mg/dL (ref 70–179)
POTASSIUM: 5 mmol/L — ABNORMAL HIGH (ref 3.4–4.5)
SODIUM: 135 mmol/L (ref 135–145)

## 2019-12-15 LAB — EGFR CKD-EPI AA FEMALE
Glomerular filtration rate/1.73 sq M.predicted.black:ArVRat:Pt:Ser/Plas/Bld:Qn:Creatinine-based formula (CKD-EPI): 48 — ABNORMAL LOW

## 2019-12-15 LAB — PLATELET COUNT: Platelets:NCnc:Pt:Bld:Qn:Automated count: 288

## 2019-12-15 LAB — PHOSPHORUS: Phosphate:MCnc:Pt:Ser/Plas:Qn:: 4.1

## 2019-12-15 LAB — MAGNESIUM: Magnesium:MCnc:Pt:Ser/Plas:Qn:: 1.9

## 2019-12-15 LAB — VANCOMYCIN RANDOM: Vancomycin^random:MCnc:Pt:Ser/Plas:Qn:: 20.8

## 2019-12-15 MED ORDER — CIPROFLOXACIN 250 MG TABLET
ORAL_TABLET | Freq: Two times a day (BID) | ORAL | 0 refills | 14.00000 days | Status: CN
Start: 2019-12-15 — End: 2019-12-29

## 2019-12-15 MED ORDER — LEVOFLOXACIN 500 MG TABLET
ORAL_TABLET | Freq: Every day | ORAL | 0 refills | 14.00000 days | Status: CP
Start: 2019-12-15 — End: 2019-12-29
  Filled 2019-12-15: qty 14, 14d supply, fill #0

## 2019-12-15 MED ORDER — CIPROFLOXACIN 500 MG TABLET
ORAL_TABLET | Freq: Two times a day (BID) | ORAL | 0 refills | 14.00000 days | Status: CP
Start: 2019-12-15 — End: 2019-12-15

## 2019-12-15 MED ORDER — ACETAMINOPHEN 325 MG TABLET
ORAL_TABLET | ORAL | 1 refills | 3 days | Status: CP | PRN
Start: 2019-12-15 — End: ?
  Filled 2019-12-15: qty 30, 3d supply, fill #0

## 2019-12-15 MED ADMIN — influenza vaccine quad (FLUARIX, FLULAVAL, FLUZONE) (6 MOS & UP) 2021-22: .5 mL | INTRAMUSCULAR | @ 18:00:00 | Stop: 2019-12-15

## 2019-12-15 MED ADMIN — insulin lispro (HumaLOG) injection 0-12 Units: 0-12 [IU] | SUBCUTANEOUS | @ 02:00:00

## 2019-12-15 MED ADMIN — vancomycin (VANCOCIN) IVPB 1000 mg (premix): 1000 mg | INTRAVENOUS | @ 02:00:00 | Stop: 2019-12-19

## 2019-12-15 MED ADMIN — ipratropium-albuteroL (DUO-NEB) 0.5-2.5 mg/3 mL nebulizer solution 3 mL: 3 mL | RESPIRATORY_TRACT | @ 02:00:00

## 2019-12-15 MED ADMIN — ipratropium-albuteroL (DUO-NEB) 0.5-2.5 mg/3 mL nebulizer solution 3 mL: 3 mL | RESPIRATORY_TRACT | @ 14:00:00 | Stop: 2019-12-15

## 2019-12-15 MED ADMIN — sildenafiL (pulm.hypertension) (REVATIO) tablet 10 mg: 10 mg | ORAL | @ 12:00:00 | Stop: 2019-12-15

## 2019-12-15 MED ADMIN — insulin NPH (HumuLIN,NovoLIN) injection 14 Units: 14 [IU] | SUBCUTANEOUS | @ 12:00:00 | Stop: 2019-12-15

## 2019-12-15 MED ADMIN — gabapentin (NEURONTIN) capsule 300 mg: 300 mg | ORAL | @ 01:00:00

## 2019-12-15 MED ADMIN — ipratropium-albuteroL (DUO-NEB) 0.5-2.5 mg/3 mL nebulizer solution 3 mL: 3 mL | RESPIRATORY_TRACT | @ 08:00:00 | Stop: 2019-12-15

## 2019-12-15 MED ADMIN — posaconazole (NOXAFIL) delayed released tablet 200 mg: 200 mg | ORAL | @ 12:00:00 | Stop: 2019-12-15

## 2019-12-15 MED ADMIN — ciprofloxacin HCl (CIPRO) tablet 500 mg: 500 mg | ORAL | @ 14:00:00 | Stop: 2019-12-15

## 2019-12-15 MED ADMIN — acetaminophen (TYLENOL) tablet 650 mg: 650 mg | ORAL | @ 06:00:00 | Stop: 2019-12-15

## 2019-12-15 MED ADMIN — metroNIDAZOLE (FLAGYL) IVPB 500 mg: 500 mg | INTRAVENOUS | @ 06:00:00 | Stop: 2019-12-15

## 2019-12-15 MED ADMIN — sildenafiL (pulm.hypertension) (REVATIO) tablet 10 mg: 10 mg | ORAL | @ 01:00:00

## 2019-12-15 MED ADMIN — sildenafiL (pulm.hypertension) (REVATIO) tablet 10 mg: 10 mg | ORAL | @ 18:00:00 | Stop: 2019-12-15

## 2019-12-15 MED FILL — LEVOFLOXACIN 500 MG TABLET: 14 days supply | Qty: 14 | Fill #0 | Status: AC

## 2019-12-15 MED FILL — NUCALA 100 MG/ML SUBCUTANEOUS AUTO-INJECTOR: ORAL | 28 days supply | Qty: 1 | Fill #5

## 2019-12-15 MED FILL — ACETAMINOPHEN 325 MG TABLET: 3 days supply | Qty: 30 | Fill #0 | Status: AC

## 2019-12-15 MED FILL — NUCALA 100 MG/ML SUBCUTANEOUS AUTO-INJECTOR: 28 days supply | Qty: 1 | Fill #5 | Status: AC

## 2019-12-15 NOTE — Unmapped (Signed)
Karen Barber is a 54 y.o.??F w/ PMH of sarcoidosis, aspergillus PNA, LUL lobectomy in 2021, T2DM, who was admitted for worsening soft tissue wound in the right fifth toe with x-ray evidence of osteomyelitis.??Patient w/ no hx of vascular disease.Right 5th toe amputation on 9/24. Recovered well over the weekend. Transitioned from vanc/cef/flagyl to 14 day course of ciprofloxacin due to sensitivities and good source control in OR. On 9/27. She was tolerating PO intake, able to spend time OOB, and voiding appropriately with pain well controlled by oral medications. She was consequently deemed medically clear for discharge. She will continue her suppressive posaconazole per ID.

## 2019-12-15 NOTE — Unmapped (Signed)
Medicine Consult Progress Note    Assessment/Plan:    Principal Problem:    Osteomyelitis of ankle and foot (CMS-HCC)  Active Problems:    Pulmonary sarcoidosis (CMS-HCC)    HTN (hypertension)    Type 2 diabetes mellitus with hyperglycemia (CMS-HCC)    Chronic respiratory failure with hypoxia (CMS-HCC)    Chronic pulmonary aspergillosis (CMS-HCC)    Pulmonary hypertension (CMS-HCC)  Resolved Problems:    * No resolved hospital problems. *                Karen Barber is a 54 y.o. female with sarcoidosis (dx in 1995), WHO class 5 pulmonary hypertension, aspergillus on suppressive posaconazole, HTN, T2DM that presented to [facility] with osteomyelitis of R 5th toe now s/p toe amputation on 09/24. Medicine was consulted for optimization of her other medical conditions.    Fever  Overnight had an isolated temperature to 38.3C. No leukocytosis this morning. No symptoms of infection including no worsened shortness of breath, no cough, no diarrhea, no dysuria. She looks overall well. With her underlying conditions, would recommend culturing to workup infectious etiology for this fever. Defer antibiotics to primary team.  - recommend 2 peripheral blood cultures  - recommend urine culture  - continue to monitor fever curve  - defer antibiotics to primary team    Sarcoidosis c/b chronic hypoxemic respiratory failure  Diagnosed in 1995 by lip biopsy. Followed by Dr. Wellington Hampshire with [facility], but recently transferred to her care to [facility]. She had evidence of active sarcoidosis on most recent PET scan 11/19/18, however, there was concern for intensifying immunosuppression due to h/o pulmonary infections. Has only been treated with prednisone (80 mg daily) for several months and then tapered to 10 mg daily. She self discontinued steroids 3 weeks prior to admission. Has been seen in transplant clinic but was ultimately not a candidate due to her anatomy. She is using 5L [place] which is baseline, though interestingly her 6 min walk test 12/10/19 indicated that she only needed up to 4L with exercise.   - Wean oxygen as tolerated for goal O2 sat >88%   - No stress dose steroids indicated as patient has been off prednisone for weeks   - No indication for diuresis today with stable O2 requirements and no LE edema  - recommend outpatient sleep study for possible OSA??    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 Other pulmonary infections include Stenotrophomonas in 12/03/14, Achromobacter in 11/18/14 and 09/25/16. 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 She follows with [facility] and was recommended to continue suppressive posaconazole 200 mg daily. Last LFT check 9/23 were within normal range.   - Continue posaconazole 200 mg daily  ??  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 Patient told [facility] pulmonology team that she discontinued sildenafil, though she tells me that she's still taking it.   - Continue sildenafil 10 mg TID  ??  Asthma   - continue symbicort inhaler and Albuterol q4H PRN  ??  CKD stage 3  Baseline creatinine 1.2-1.4. Currently creatinine at baseline. Likely secondary to DM and HTN.   - Daily BMP  - avoid nephrotoxins??    T2DM   Last hemoglobin A1c: 9.9. Current regimen includes metformin 1000 mg BID, lantus 27 units  nightly and short acting insulin.   - defer management to Endocrinology  ??  Hypertension   Managed with hydrochlorothiazide 25 mg daily as outpatient. Normotensive.  - holding hydrochlorothiazide pre-operatively, and would hold on discharge  ??  Right 5th toe osteomyelitis s/p amputation 9/24  Patient admitted for amputation after evaluation in podiatry clinic where she was found to have large ulceration and bone exposure on right 5th toe. X-ray confirmed osteomyelitis. She was empirically treated with vanc/cefe/flagyl prior to amputation 9/24.  - defer management to primary team  ??  For questions between 7:30AM-5PM, please page the Medicine Consult Service pager at [phone]. After 5PM, the Medicine Consult Service is covered by the [facility] On-Call Resident for urgent/emergent questions or concerns.   ____________________________________________________________    Subjective:  No overnight events. Remains on 5-6L oxygen. She feels at baseline in terms of her breathing which always is better at rest and worse with exertion. Her foot remains numb and without any pain. She has no diarrhea. Having no difficulty or pain with urination. She has an appetite.    Labs/Studies:  Labs and Studies from the last 24hrs per EMR and Reviewed    Objective:  Temp:  [36.4 ??C-38.3 ??C] 36.4 ??C  Heart Rate:  [94-112] 94  Resp:  [17-18] 17  BP: (105-127)/(57-65) 105/57  SpO2:  [85 %-98 %] 91 %    GEN: NAD, sitting up in bed, ordering breakfast  ENT: MMM   CV: normal rate, regular rhythm  PULM: normal WoB, bibasilar crackles present  ABD: soft, +BS, NT/ND   EXT: warm, no LE edema  SKIN: no rashes  Neuro: alert, fully oriented, no new deficits

## 2019-12-15 NOTE — Unmapped (Deleted)
SRV Discharge Summary    Admit date: 12/11/2019    Discharge date: 12/15/19     Discharge to:  Home    Discharge Service: Surg Vascular (SRV)  Discharge Attending Physician: Deneise Lever, MD    Discharge  Diagnoses: Principal Problem:    Osteomyelitis of ankle and foot (CMS-HCC)  Active Problems:    Pulmonary sarcoidosis (CMS-HCC)    HTN (hypertension)    Type 2 diabetes mellitus with hyperglycemia (CMS-HCC)    Chronic respiratory failure with hypoxia (CMS-HCC)    Chronic pulmonary aspergillosis (CMS-HCC)    Pulmonary hypertension (CMS-HCC)      4    OR Procedures: Right - Right foot fifth toe amputation at the metatarsophalangeal joint level 12/12/2019         Discharge Day Services:  Subjective  The patient was seen by the vascular surgery team on the day of discharge.  Vital signs and laboratory data were reviewed.  Discharge instructions were given and all questions were addressed.    Objective   Patient Vitals for the past 8 hrs:   BP Temp Temp src Pulse Resp SpO2   12/15/19 0732 105/57 36.4 ??C Oral 94 17 91 %   12/15/19 0700 ??? ??? ??? ??? ??? (!) 85 %     No intake/output data recorded.    Physical Exam:  Gen: AAox3   CV: RRR  Resp: normal WOB on 5L (home o2)  Extremities: right foot bandages and post op shoe in place     Hospital Course:   [name] is a 54 y.o.??F w/ PMH of sarcoidosis, aspergillus PNA, LUL lobectomy in 2021, T2DM, who was admitted for worsening soft tissue wound in the right fifth toe with x-ray evidence of osteomyelitis.??Patient w/ no hx of vascular disease.Right 5th toe amputation on 9/24. Recovered well over the weekend. Transitioned from vanc/cef/flagyl to 14 day course of ciprofloxacin due to sensitivities and good source control in OR. On 9/27. She was tolerating PO intake, able to spend time OOB, and voiding appropriately with pain well controlled by oral medications. She was consequently deemed medically clear for discharge. She will continue her suppressive posaconazole per ID. Condition at Discharge: Improved  Discharge Medications:      Your Medication List      STOP taking these medications    hydroCHLOROthiazide 25 MG tablet  Commonly known as: HYDRODIURIL        START taking these medications    acetaminophen 325 MG tablet  Commonly known as: TYLENOL  Take 2 tablets (650 mg total) by mouth every four (4) hours as needed.     ciprofloxacin HCl 500 MG tablet  Commonly known as: CIPRO  Take 1 tablet (500 mg total) by mouth every twelve (12) hours for 14 days.        CHANGE how you take these medications    insulin glargine 100 unit/mL (3 mL) injection pen  Commonly known as: BASAGLAR, LANTUS  Inject 0.18 mL (18 Units total) under the skin daily. 18 units  What changed: how much to take     ipratropium 0.02 % nebulizer solution  Commonly known as: ATROVENT  Inhale 2.5 mL (500 mcg total) by nebulization Four (4) times a day.  What changed: when to take this     sildenafiL (pulm.hypertension) 20 mg tablet  Commonly known as: REVATIO  Take 1 tablet (20 mg total) by mouth Three (3) times a day.  What changed: how much to take  CONTINUE taking these medications    ACCU-CHEK FASTCLIX LANCET DRUM Misc  Generic drug: lancets  USE TO CHECK GLUCOSE 4 TIMES DAILY BEFORE MEAL(S) AND NIGHTLY     ACCU-CHEK GUIDE GLUCOSE METER Misc  Generic drug: blood-glucose meter  1 Device by Other route Four (4) times a day (before meals and nightly). AS DIRECTED     albuterol 2.5 mg /3 mL (0.083 %) nebulizer solution  Inhale 3 mL (2.5 mg total) by nebulization every six (6) hours as needed for wheezing or shortness of breath (airway clearance/cough).     albuterol 90 mcg/actuation inhaler  Commonly known as: PROVENTIL HFA;VENTOLIN HFA  Inhale 2 puffs every four (4) hours as needed for wheezing or shortness of breath.     blood sugar diagnostic Strp  by Other route Three (3) times a day.     budesonide-formoteroL 160-4.5 mcg/actuation inhaler  Commonly known as: SYMBICORT  Inhale 2 puffs Two (2) times a day.     cyclobenzaprine 10 MG tablet  Commonly known as: FLEXERIL  Take 1 tablet (10 mg total) by mouth Three (3) times a day as needed.     empty container Misc  USE AS DIRECTED     EPINEPHrine 0.3 mg/0.3 mL injection  Commonly known as: EPIPEN 2-PAK  Inject 0.3 mL (0.3 mg total) into the muscle once for 1 dose.     gabapentin 300 MG capsule  Commonly known as: NEURONTIN  Take 1 capsule (300 mg total) by mouth Three (3) times a day.     insulin ASPART 100 unit/mL injection  Commonly known as: NovoLOG U-100 Insulin aspart  Take 1-20 units SQ QID AC and HS as directed     ketoconazole 2 % cream  Commonly known as: NIZORAL  Apply 1 application topically Two (2) times a day.     MEDICAL SUPPLY ITEM  Accucheck Glide Glocumeter for home glucose checks. Check blood glucose ACHS.     metFORMIN 1000 MG tablet  Commonly known as: GLUCOPHAGE  Take 1 tablet (1,000 mg total) by mouth 2 (two) times a day with meals.     miscellaneous medical supply Misc  Nebulizer machine and tubing. Patients machine is 56 years old.     NUCALA 100 mg/mL Atin  Generic drug: mepolizumab  Inject the contents of 1 syringe (100 mg) under the skin every twenty-eight (28) days.     OXYGEN-AIR DELIVERY SYSTEMS MISC  Dose: 2-3 LPM     posaconazole 100 mg Tbec delayed released tablet  Commonly known as: NOXAFIL  Take 200 mg by mouth daily.     pravastatin 40 MG tablet  Commonly known as: PRAVACHOL  Take 1 tablet (40 mg total) by mouth daily with evening meal.        ASK your doctor about these medications    mupirocin 2 % ointment  Commonly known as: BACTROBAN  Apply topically Two (2) times a day for 7 days.  Ask about: Should I take this medication?             Pending Test Results:   Pending Labs     Order Current Status    AFB culture In process    AFB culture In process    Surgical pathology exam In process    Aerobic/Anaerobic Culture Preliminary result    Aerobic/Anaerobic Culture Preliminary result    Fungal Culture Preliminary result    Fungal Culture Preliminary result           Discharge Instructions:  Follow Up instructions and Outpatient Referrals     Discharge instructions      OPEN WOUND SUB-INSTRUCTIONS:  1) Patient should not immerse wounds and keep wound covered if showering. Wash the wound site seperately with mild soap and water, but do not scrub.  2) Apply (primary wound dressing, NOT WET-TO-DRY), followed by guaze/abd pad, kerlix, ace wrap (if compression is indicated), and tape  3) Dial 911 for emergencies. Mon-Fri, 9am-5pm call the Wound Clinic at [phone], or for evenings/weekends page the Junior In-house Surgery Resident on-call by calling [phone] for any of the following:  -fever >101.11F by mouth  -uncontrolled nausea or vomiting  -pain uncontrolled with prescribed medication  -inablily to pass stool for 3 days or more  -increased redness, foul smell, new wound formation, undermining of surrounding skin or extensive blackening of the wound bed or skin edges  4) Inspect wound sites once daily, call clinic or page resident on call for spreading redness, purulent discharge, or increasing bleeding or drainage, or for separation of wounds.  5) Do not drive while taking narcotic pain medications.  6) Resume all home medications as listed under discharge medications.   7) Complete the entire course of antibiotics.          Other Instructions     Discharge instructions      OPEN WOUND SUB-INSTRUCTIONS:  1) Patient should not immerse wounds and keep wound covered if showering. Wash the wound site seperately with mild soap and water, but do not scrub.  2) Apply (primary wound dressing, NOT WET-TO-DRY), followed by guaze/abd pad, kerlix, ace wrap (if compression is indicated), and tape  3) Dial 911 for emergencies. Mon-Fri, 9am-5pm call the Wound Clinic at [phone], or for evenings/weekends page the Junior In-house Surgery Resident on-call by calling [phone] for any of the following:  -fever >101.11F by mouth  -uncontrolled nausea or vomiting  -pain uncontrolled with prescribed medication  -inablily to pass stool for 3 days or more  -increased redness, foul smell, new wound formation, undermining of surrounding skin or extensive blackening of the wound bed or skin edges  4) Inspect wound sites once daily, call clinic or page resident on call for spreading redness, purulent discharge, or increasing bleeding or drainage, or for separation of wounds.  5) Do not drive while taking narcotic pain medications.  6) Resume all home medications as listed under discharge medications.   7) Complete the entire course of antibiotics.         OPEN WOUND SUB-INSTRUCTIONS:  1) Patient should not immerse wounds and keep wound covered if showering. Wash the wound site seperately with mild soap and water, but do not scrub.  2) Apply (primary wound dressing, NOT WET-TO-DRY), followed by guaze/abd pad, kerlix, ace wrap (if compression is indicated), and tape  3) Dial 911 for emergencies. Mon-Fri, 9am-5pm call the Wound Clinic at [phone], or for evenings/weekends page the Junior In-house Surgery Resident on-call by calling [phone] for any of the following:  -fever >101.11F by mouth  -uncontrolled nausea or vomiting  -pain uncontrolled with prescribed medication  -inablily to pass stool for 3 days or more  -increased redness, foul smell, new wound formation, undermining of surrounding skin or extensive blackening of the wound bed or skin edges  4) Inspect wound sites once daily, call clinic or page resident on call for spreading redness, purulent discharge, or increasing bleeding or drainage, or for separation of wounds.  5) Do not drive while taking narcotic pain medications.  6) Resume  all home medications as listed under discharge medications.   7) Complete the entire course of antibiotics.          Future Appointments:  Appointments which have been scheduled for you    Dec 30, 2019 10:00 AM  (Arrive by 9:45 AM)  DIETICIAN NEW with Fabio Bering Primary Care at [facility]) [address]  [place] [address]  [phone]      Jan 05, 2020  9:40 AM  (Arrive by 9:25 AM)  OFFICE VISIT with Loran Senters, FNP  [facility] Primary Care at [facility]) [address]  [place] [address]  [phone]             Winifred Olive, MD, PGY-1  General Surgery Resident

## 2019-12-15 NOTE — Unmapped (Signed)
Podiatry Progress Note    Assessment and Plan    [name] is a 54 year old female with PMH of DM, Sarcoidosis, LUL lobectomy (2/2 aspergillus PNA), HTN, HLD.  Patient was admitted after being sent to the ED from the clinic for Right 5th toe wound and osteomyelitis seen on Xray.  Patient is on 5L O2 at home though usually on 2L.     Dr. Allena Katz performed a Right 5th toe maputation at the MTP joint level on 9/24.      Xray - done post op    Dressing - Betadine adaptic   Offloading - surgical shoe    Weight bearing - Heel weight bearing on right   Nutrition - HgA1c 10.1 12/12/19   Arterial status - Palpable pulses DP/PT   Antibiotics - Cx grew Enterobacter from clean bone Cx. Susceptible to Cipro and LEvo.    Appt. - requested    Flo Shanks, PA-C  Vascular Surgery  [phone] Please page with questions M-F 6am - 2pm    Interval History  No pain in the foot.   Had fever overnight but patient was not aware.     Exam  Right foot. Dressing removed.   Right 5th toe amputation site closed. No drainage. Edges well approximated.   No erythema.    Palpable pulse on the right.

## 2019-12-15 NOTE — Unmapped (Signed)
Endocrine Team Diabetes Follow Up Consult Note       Requesting Attending Physician : Deneise Lever, MD  Service Requesting Consult : Surg Vascular (SRV)  Primary Care Provider: Noralyn Pick, FNP    Assessment and Plan:  IMPRESSION:  Karen Barber is a 54 y.o. female admitted for right fifth toe amputation. We have been consulted at the request of Deneise Lever, MD to evaluate Karen Barber for hyperglycemia.       RECOMMENDATIONS:  1. Type 2 diabetes, uncontrolled: A1C 9.9%. Blood sugars mostly at goal yesterday with increased insulin doses. Recommend continuing with current regimen for now.   - NPH 14 units q12h  - Lispro 8 units with meals   - hold if NPO. Adjust for PO intake.  - Lispro 2:50>150 achs    Other problems complicating glycemic control:  Variable PO intake and sarcoidosis and chronic prednisone use  Principal Problem:    Osteomyelitis of ankle and foot (CMS-HCC)  Active Problems:    Pulmonary sarcoidosis (CMS-HCC)    HTN (hypertension)    Type 2 diabetes mellitus with hyperglycemia (CMS-HCC)    Chronic respiratory failure with hypoxia (CMS-HCC)    Chronic pulmonary aspergillosis (CMS-HCC)    Pulmonary hypertension (CMS-HCC)    2. Nutrition: complicating glycemic control.   - carb consistent diet 60/60/60  - variable PO intake     Thank you for this consult. Communicated plan to primary team. We will continue to follow and make recommendations and place orders as appropriate.    Please page with questions or concerns: [place], [place]: [phone]  DCT on call from 6AM - 3PM on weekdays then endocrine fellow on call: [phone] from 3PM - 6AM on weekdays and on weekends and holidays.   If APP cannot be reached, please page the endocrine fellow on call.      Subjective:  Initial encounter HPI:  Karen Barber is a 54 y.o. female with pertinent past medical history of sarcoidosis, COPD, asthma, hypertension, and type 2 diabetes admitted for urgent right fifth toe amputation.  Patient has a history of sarcoidosis but it is stable, and there is no concern for sarcoid flare.  She is not currently on prednisone and states her home blood glucose levels have been better controlled recently. She stopped taking prednisone about 3 weeks ago. When taking prednisone her blood sugars were frequently in the 500s. Since stopping prednisone her blood sugars have been mostly in the 200s.     Diabetes History:  Patient has a history of Type 2 diabetes diagnosed several years ago when she started taking prednisone.  Diabetes is managed by: PCP.  Current home diabetes regimen:metformin 1000 mg BID, glargine 24 units nightly (titrating up by 3 units daily until she gets to 30 units total per PCP) and novolog sliding scale.  Current home blood glucose monitoring:  daily.  Hypoglycemia awareness: yes.  Complications related to diabetes: unknown     Interval history:  - not sleeping well in the hospital  - doesn't have much of an appetite but is eating some with each meal  - ordered 2 hard boiled eggs and a biscuit for breakfast but thinks she'll eat about half  - denies nausea   - hoping to discharge home today    Current Diabetes Inpatient Regimen:  - NPH 14 q12, Humalog (Lispro) 8ac + 2:50>150 achs    Current Nutrition:  Active Orders   Diet    Nutrition Therapy Consistent Carb; Consistent Carb  60/60/60 (4/4/4)       ROS: As per history.    ??? Cefepime  2 g Intravenous Q12H   ??? gabapentin  300 mg Oral TID   ??? insulin lispro  0-12 Units Subcutaneous ACHS   ??? insulin lispro  8 Units Subcutaneous TID AC   ??? insulin NPH  14 Units Subcutaneous Q12H [facility]   ??? ipratropium-albuteroL  3 mL Nebulization Q6H (RT)   ??? metronidazole  500 mg Intravenous Q8H   ??? posaconazole  200 mg Oral Daily   ??? pravastatin  40 mg Oral Daily   ??? sildenafiL (pulm.hypertension)  10 mg Oral TID   ??? vancomycin  1,000 mg Intravenous Q12H [facility]       Past Medical History:   Diagnosis Date   ??? 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 Breast injury     lung surg on left incision under breast sept 2016   ??? Caregiver burden     parents    ??? Hepatitis C antibody test positive 2016    repeatedly negative HCV RNA, indicating clearance of infection w/o treatment   ??? Hyperlipidemia    ??? Hypertension    ??? 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 On home O2 2008    per Dr Mal Amabile note from 02/21/2017   ??? Pulmonary aspergillosis (CMS-HCC) 2016    A.fumigatus in 2016, prior to LUL lobectomy. A.niger from sputum 08/2016-09/2016.   ??? 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 Sarcoidosis 1995   ??? Type 2 diabetes mellitus with hyperglycemia (CMS-HCC) 07/27/2014   ??? Visual impairment     glasses       Past Surgical History:   Procedure Laterality Date   ??? LUNG LOBECTOMY     ??? PR RIGHT HEART CATH O2 SATURATION & CARDIAC OUTPUT N/A 04/29/2019    Procedure: Right Heart Catheterization;  Surgeon: Rosana Hoes, MD;  Location: [facility] CATH;  Service: Cardiology   ??? PR THORACOSCOPY SURG TOT PULM DECORT Left 12/14/2014    Procedure: THORACOSCOPY SURG; W/TOT PULM DECORTIC/PNEUMOLYS;  Surgeon: Evert Kohl, MD;  Location: MAIN OR [facility];  Service: Cardiothoracic   ??? PR THORACOSCOPY W/THERA WEDGE RESEXN INITIAL UNILAT Left 12/03/2014    Procedure: THORACOSCOPY, SURGICAL; WITH THERAPEUTIC WEDGE RESECTION (EG, MASS, NODULE) INITIAL UNILATERAL;  Surgeon: Evert Kohl, MD;  Location: MAIN OR [facility];  Service: Cardiothoracic       Family History   Problem Relation Age of Onset   ??? Diabetes Father    ??? Diabetes Brother    ??? Diabetes Maternal Aunt    ??? Sarcoidosis Maternal Aunt    ??? Diabetes Maternal Uncle    ??? Diabetes Paternal Aunt    ??? Diabetes Paternal Uncle        Social History     Tobacco Use   ??? Smoking status: Former Smoker     Packs/day: 0.50     Years: 9.00     Pack years: 4.50     Types: Cigarettes     Quit date: 05/03/1987     Years since quitting: 32.6   ??? Smokeless tobacco: Never Used   Vaping Use   ??? Vaping Use: Never used   Substance Use Topics   ??? Alcohol use: No     Alcohol/week: 0.0 standard drinks   ??? Drug use: No  OBJECTIVE:  BP 108/64  - Pulse 112  - Temp (!) 38.3 ??C (101 ??F) (Oral)  - Resp 17  - Ht 180.3 cm (5' 11)  - Wt (!) 110 kg (242 lb 8 oz)  - LMP  (LMP Unknown)  - SpO2 94%  - BMI 33.82 kg/m??   Wt Readings from Last 12 Encounters:   12/14/19 (!) 110 kg (242 lb 8 oz)   11/11/19 (!) 109.3 kg (241 lb)   08/22/19 (!) 104.3 kg (230 lb)   08/07/19 100.2 kg (221 lb)   05/30/19 (!) 107.6 kg (237 lb 4.8 oz)   05/29/19 (!) 109.8 kg (242 lb)   05/13/19 (!) 102.1 kg (225 lb)   04/29/19 (!) 107 kg (236 lb)   01/24/19 (!) 103.4 kg (228 lb)   01/08/19 (!) 103.4 kg (228 lb)   11/18/18 95.3 kg (210 lb)   11/01/18 99.8 kg (220 lb)     Physical Exam  Vitals reviewed.   Constitutional:       General: She is not in acute distress.     Appearance: She is ill-appearing.      Comments: Sitting up on edge of bed   HENT:      Mouth/Throat:      Mouth: Mucous membranes are moist.   Eyes:      Extraocular Movements: Extraocular movements intact.      Conjunctiva/sclera: Conjunctivae normal.   Pulmonary:      Effort: No respiratory distress.      Comments: Supplemental oxygen via nasal cannula, symmetrical chest rise  Musculoskeletal:      Cervical back: Normal range of motion.      Comments: Bandage on right foot, right foot in surgical shoe   Neurological:      Mental Status: She is alert and oriented to person, place, and time.   Psychiatric:         Mood and Affect: Mood normal.         Behavior: Behavior normal.          Data Review    BG/insulin reviewed per EMR.   Glucose, POC (mg/dL)   Date Value   09/81/1914 167   12/14/2019 162   12/14/2019 110   12/14/2019 159   12/14/2019 244 (H)   12/14/2019 241 (H)   12/13/2019 247 (H)   12/13/2019 244 (H)   07/03/2014 161   07/03/2014 161   07/02/2014 243 (H)   07/02/2014 131   07/02/2014 202 (H)   07/02/2014 123   07/01/2014 251 (H)   07/01/2014 207 (H)        Summary of labs:  Lab Results   Component Value Date    A1C 10.1 (H) 12/12/2019    A1C 9.9 (A) 11/11/2019    A1C 9.7 (A) 05/29/2019     Lab Results   Component Value Date    GFR >= 60 05/31/2011    CREATININE 1.27 (H) 12/14/2019     Lab Results   Component Value Date    WBC 8.5 12/14/2019    HGB 11.6 (L) 12/14/2019    HCT 38.1 12/14/2019    PLT 332 12/14/2019       Lab Results   Component Value Date    NA 131 (L) 12/14/2019    K 5.0 (H) 12/14/2019    CL 96 (L) 12/14/2019    CO2 35.0 (H) 12/14/2019    BUN 14 12/14/2019    CREATININE 1.27 (H) 12/14/2019  GLU 249 (H) 12/14/2019    CALCIUM 9.5 12/14/2019    MG 1.9 12/14/2019    PHOS 2.5 12/14/2019       Lab Results   Component Value Date    BILITOT 0.5 12/11/2019    BILIDIR <0.10 05/30/2019    PROT 7.1 12/11/2019    ALBUMIN 3.3 (L) 12/11/2019    ALT 15 12/11/2019    AST 16 12/11/2019    ALKPHOS 88 12/11/2019    GGT 60 (H) 05/31/2011       Lab Results   Component Value Date    INR 1.02 12/11/2019    APTT 30.9 12/11/2019

## 2019-12-15 NOTE — Unmapped (Signed)
Patient verbalizes readiness for discharge.       Problem: Adult Inpatient Plan of Care  Goal: Plan of Care Review  Outcome: Resolved  Goal: Patient-Specific Goal (Individualized)  Outcome: Resolved  Goal: Absence of Hospital-Acquired Illness or Injury  Outcome: Resolved  Intervention: Identify and Manage Fall Risk  Recent Flowsheet Documentation  Taken 12/15/2019 0800 by Asencion Gowda, RN  Safety Interventions:   environmental modification   fall reduction program maintained   lighting adjusted for tasks/safety   low bed   nonskid shoes/slippers when out of bed  Goal: Optimal Comfort and Wellbeing  Outcome: Resolved  Goal: Readiness for Transition of Care  Outcome: Resolved  Goal: Rounds/Family Conference  Outcome: Resolved     Problem: Asthma Comorbidity  Goal: Maintenance of Asthma Control  Outcome: Resolved     Problem: Diabetes Comorbidity  Goal: Blood Glucose Level Within Targeted Range  Outcome: Resolved     Problem: Hypertension Comorbidity  Goal: Blood Pressure in Desired Range  Outcome: Resolved     Problem: Fall Injury Risk  Goal: Absence of Fall and Fall-Related Injury  Outcome: Resolved  Intervention: Promote Injury-Free Environment  Recent Flowsheet Documentation  Taken 12/15/2019 0800 by Asencion Gowda, RN  Safety Interventions:   environmental modification   fall reduction program maintained   lighting adjusted for tasks/safety   low bed   nonskid shoes/slippers when out of bed

## 2019-12-15 NOTE — Unmapped (Signed)
called pt to let her no about her scheduled appt with dr patel 12/25/19 @ 10:20 am

## 2019-12-15 NOTE — Unmapped (Addendum)
Vitals stable this shift, has required between 4-6 L per nasal cannula to maintain sats >86. Pain adequately controlled with prn meds. Comfort needs addressed. Blood glucose better controlled today, but food intake likely less than previous days. Continuing with IV antibiotics.    Problem: Adult Inpatient Plan of Care  Goal: Plan of Care Review  Outcome: Progressing  Goal: Patient-Specific Goal (Individualized)  Outcome: Progressing  Goal: Absence of Hospital-Acquired Illness or Injury  Outcome: Progressing  Goal: Optimal Comfort and Wellbeing  Outcome: Progressing  Goal: Readiness for Transition of Care  Outcome: Progressing  Goal: Rounds/Family Conference  Outcome: Progressing     Problem: Impaired Wound Healing  Goal: Optimal Wound Healing  Outcome: Progressing     Problem: Asthma Comorbidity  Goal: Maintenance of Asthma Control  Outcome: Progressing     Problem: Diabetes Comorbidity  Goal: Blood Glucose Level Within Targeted Range  Outcome: Progressing     Problem: Hypertension Comorbidity  Goal: Blood Pressure in Desired Range  Outcome: Progressing     Problem: Fall Injury Risk  Goal: Absence of Fall and Fall-Related Injury  Outcome: Progressing     Problem: Self-Care Deficit  Goal: Improved Ability to Complete Activities of Daily Living  Outcome: Progressing

## 2019-12-15 NOTE — Unmapped (Signed)
Problem: Adult Inpatient Plan of Care  Goal: Plan of Care Review  Outcome: Progressing  Goal: Patient-Specific Goal (Individualized)  Outcome: Progressing  Goal: Absence of Hospital-Acquired Illness or Injury  Outcome: Progressing  Intervention: Identify and Manage Fall Risk  Recent Flowsheet Documentation  Taken 12/14/2019 2000 by Santiago Bur, RN  Safety Interventions:   low bed   fall reduction program maintained  Goal: Optimal Comfort and Wellbeing  Outcome: Progressing  Goal: Readiness for Transition of Care  Outcome: Progressing  Goal: Rounds/Family Conference  Outcome: Progressing     Problem: Impaired Wound Healing  Goal: Optimal Wound Healing  Outcome: Progressing     Problem: Asthma Comorbidity  Goal: Maintenance of Asthma Control  Outcome: Progressing     Problem: Diabetes Comorbidity  Goal: Blood Glucose Level Within Targeted Range  Outcome: Progressing     Problem: Hypertension Comorbidity  Goal: Blood Pressure in Desired Range  Outcome: Progressing     Problem: Fall Injury Risk  Goal: Absence of Fall and Fall-Related Injury  Outcome: Progressing  Intervention: Promote Injury-Free Environment  Recent Flowsheet Documentation  Taken 12/14/2019 2000 by Santiago Bur, RN  Safety Interventions:   low bed   fall reduction program maintained     Problem: Self-Care Deficit  Goal: Improved Ability to Complete Activities of Daily Living  Outcome: Progressing  No acute changes. VSS. Voiding spontaneously. Active bowel sounds in all quadrants. Tolerating diet. Neurovascular systems intact with palpable pulses and brisk capillary refill in all extremities. Pt in NAD. Will continue to monitor.

## 2019-12-15 NOTE — Unmapped (Signed)
IV and continuous pulse ox removed. Patient transitioned to home O2 portable concentrator. Discharge instructions/AVS reviewed with patient and questions answered. Discharged to home at this time.       Problem: Impaired Wound Healing  Goal: Optimal Wound Healing  12/15/2019 1414 by Asencion Gowda, RN  Outcome: Discharged to Home  12/15/2019 1327 by Asencion Gowda, RN  Outcome: Progressing     Problem: Self-Care Deficit  Goal: Improved Ability to Complete Activities of Daily Living  12/15/2019 1414 by Asencion Gowda, RN  Outcome: Discharged to Home  12/15/2019 1327 by Asencion Gowda, RN  Outcome: Progressing

## 2019-12-15 NOTE — Unmapped (Signed)
SRV Discharge Summary    Admit date: 12/11/2019    Discharge date: 12/15/19     Discharge to:  Home    Discharge Service: Surg Vascular (SRV)  Discharge Attending Physician: Deneise Lever, MD    Discharge  Diagnoses: Principal Problem:    Osteomyelitis of ankle and foot (CMS-HCC)  Active Problems:    Pulmonary sarcoidosis (CMS-HCC)    HTN (hypertension)    Type 2 diabetes mellitus with hyperglycemia (CMS-HCC)    Chronic respiratory failure with hypoxia (CMS-HCC)    Chronic pulmonary aspergillosis (CMS-HCC)    Pulmonary hypertension (CMS-HCC)      4    OR Procedures: Right - Right foot fifth toe amputation at the metatarsophalangeal joint level 12/12/2019      Discharge Day Services:  Subjective  The patient was seen by the vascular surgery team on the day of discharge.  Vital signs and laboratory data were reviewed.  Discharge instructions were given and all questions were addressed.    Objective   Patient Vitals for the past 8 hrs:   BP Temp Temp src Pulse Resp SpO2   12/15/19 0732 105/57 36.4 ??C Oral 94 17 91 %   12/15/19 0700 ??? ??? ??? ??? ??? (!) 85 %     I/O this shift:  In: -   Out: 500 [Urine:500]     Physical Exam:  Gen: AAox3   CV: RRR  Resp: normal WOB on 5L (home o2)  Extremities: right foot bandages and post op shoe in place     Hospital Course:   Ms [name] is a 54 y.o.??F w/ PMH of sarcoidosis, aspergillus PNA, LUL lobectomy in 2021, T2DM, who was admitted for worsening soft tissue wound in the right fifth toe with x-ray evidence of osteomyelitis.??Patient w/ no hx of vascular disease.Right 5th toe amputation on 9/24. Recovered well over the weekend. Transitioned from vanc/cef/flagyl to 14 day course of ciprofloxacin due to sensitivities and good source control in OR. On 9/27. She was tolerating PO intake, able to spend time OOB, and voiding appropriately with pain well controlled by oral medications. She was consequently deemed medically clear for discharge. She will continue her suppressive posaconazole per ID.       Condition at Discharge: Improved  Discharge Medications:      Your Medication List      STOP taking these medications    hydroCHLOROthiazide 25 MG tablet  Commonly known as: HYDRODIURIL        START taking these medications    acetaminophen 325 MG tablet  Commonly known as: TYLENOL  Take 2 tablets (650 mg total) by mouth every four (4) hours as needed.     levoFLOXacin 500 MG tablet  Commonly known as: LEVAQUIN  Take 1 tablet (500 mg total) by mouth daily for 14 days.        CHANGE how you take these medications    insulin glargine 100 unit/mL (3 mL) injection pen  Commonly known as: BASAGLAR, LANTUS  Inject 0.18 mL (18 Units total) under the skin daily. 18 units  What changed: how much to take     ipratropium 0.02 % nebulizer solution  Commonly known as: ATROVENT  Inhale 2.5 mL (500 mcg total) by nebulization Four (4) times a day.  What changed: when to take this     sildenafiL (pulm.hypertension) 20 mg tablet  Commonly known as: REVATIO  Take 1 tablet (20 mg total) by mouth Three (3) times a day.  What changed: how  much to take        CONTINUE taking these medications    ACCU-CHEK FASTCLIX LANCET DRUM Misc  Generic drug: lancets  USE TO CHECK GLUCOSE 4 TIMES DAILY BEFORE MEAL(S) AND NIGHTLY     ACCU-CHEK GUIDE GLUCOSE METER Misc  Generic drug: blood-glucose meter  1 Device by Other route Four (4) times a day (before meals and nightly). AS DIRECTED     albuterol 2.5 mg /3 mL (0.083 %) nebulizer solution  Inhale 3 mL (2.5 mg total) by nebulization every six (6) hours as needed for wheezing or shortness of breath (airway clearance/cough).     albuterol 90 mcg/actuation inhaler  Commonly known as: PROVENTIL HFA;VENTOLIN HFA  Inhale 2 puffs every four (4) hours as needed for wheezing or shortness of breath.     blood sugar diagnostic Strp  by Other route Three (3) times a day.     budesonide-formoteroL 160-4.5 mcg/actuation inhaler  Commonly known as: SYMBICORT  Inhale 2 puffs Two (2) times a day. cyclobenzaprine 10 MG tablet  Commonly known as: FLEXERIL  Take 1 tablet (10 mg total) by mouth Three (3) times a day as needed.     empty container Misc  USE AS DIRECTED     EPINEPHrine 0.3 mg/0.3 mL injection  Commonly known as: EPIPEN 2-PAK  Inject 0.3 mL (0.3 mg total) into the muscle once for 1 dose.     gabapentin 300 MG capsule  Commonly known as: NEURONTIN  Take 1 capsule (300 mg total) by mouth Three (3) times a day.     insulin ASPART 100 unit/mL injection  Commonly known as: NovoLOG U-100 Insulin aspart  Take 1-20 units SQ QID AC and HS as directed     ketoconazole 2 % cream  Commonly known as: NIZORAL  Apply 1 application topically Two (2) times a day.     MEDICAL SUPPLY ITEM  Accucheck Glide Glocumeter for home glucose checks. Check blood glucose ACHS.     metFORMIN 1000 MG tablet  Commonly known as: GLUCOPHAGE  Take 1 tablet (1,000 mg total) by mouth 2 (two) times a day with meals.     miscellaneous medical supply Misc  Nebulizer machine and tubing. Patients machine is 56 years old.     NUCALA 100 mg/mL Atin  Generic drug: mepolizumab  Inject the contents of 1 syringe (100 mg) under the skin every twenty-eight (28) days.     OXYGEN-AIR DELIVERY SYSTEMS MISC  Dose: 2-3 LPM     posaconazole 100 mg Tbec delayed released tablet  Commonly known as: NOXAFIL  Take 200 mg by mouth daily.     pravastatin 40 MG tablet  Commonly known as: PRAVACHOL  Take 1 tablet (40 mg total) by mouth daily with evening meal.        ASK your doctor about these medications    mupirocin 2 % ointment  Commonly known as: BACTROBAN  Apply topically Two (2) times a day for 7 days.  Ask about: Should I take this medication?             Pending Test Results:   Pending Labs     Order Current Status    AFB culture In process    AFB culture In process    Surgical pathology exam In process    Aerobic/Anaerobic Culture Preliminary result    Aerobic/Anaerobic Culture Preliminary result    Fungal Culture Preliminary result    Fungal Culture Preliminary result  Discharge Instructions:   Follow Up instructions and Outpatient Referrals     Discharge instructions      OPEN WOUND SUB-INSTRUCTIONS:  1) Patient should not immerse wounds and keep wound covered if showering. Wash the wound site seperately with mild soap and water, but do not scrub.  2) Apply (primary wound dressing, NOT WET-TO-DRY), followed by guaze/abd pad, kerlix, ace wrap (if compression is indicated), and tape  3) Dial 911 for emergencies. Mon-Fri, 9am-5pm call the Wound Clinic at [phone], or for evenings/weekends page the Junior In-house Surgery Resident on-call by calling [phone] for any of the following:  -fever >101.64F by mouth  -uncontrolled nausea or vomiting  -pain uncontrolled with prescribed medication  -inablily to pass stool for 3 days or more  -increased redness, foul smell, new wound formation, undermining of surrounding skin or extensive blackening of the wound bed or skin edges  4) Inspect wound sites once daily, call clinic or page resident on call for spreading redness, purulent discharge, or increasing bleeding or drainage, or for separation of wounds.  5) Do not drive while taking narcotic pain medications.  6) Resume all home medications as listed under discharge medications- please do not resume hydrochlorothiazide until you have followed up with your PCP  7) Complete the entire course of antibiotics.  8) We have scheduled you follow up with your PCP this week. Please attend this appointment to discuss restarting your hydrochlorothiazide, etc.          Other Instructions     Discharge instructions      OPEN WOUND SUB-INSTRUCTIONS:  1) Patient should not immerse wounds and keep wound covered if showering. Wash the wound site seperately with mild soap and water, but do not scrub.  2) Apply (primary wound dressing, NOT WET-TO-DRY), followed by guaze/abd pad, kerlix, ace wrap (if compression is indicated), and tape  3) Dial 911 for emergencies. Mon-Fri, 9am-5pm call the Wound Clinic at [phone], or for evenings/weekends page the Junior In-house Surgery Resident on-call by calling [phone] for any of the following:  -fever >101.64F by mouth  -uncontrolled nausea or vomiting  -pain uncontrolled with prescribed medication  -inablily to pass stool for 3 days or more  -increased redness, foul smell, new wound formation, undermining of surrounding skin or extensive blackening of the wound bed or skin edges  4) Inspect wound sites once daily, call clinic or page resident on call for spreading redness, purulent discharge, or increasing bleeding or drainage, or for separation of wounds.  5) Do not drive while taking narcotic pain medications.  6) Resume all home medications as listed under discharge medications- please do not resume hydrochlorothiazide until you have followed up with your PCP  7) Complete the entire course of antibiotics.  8) We have scheduled you follow up with your PCP this week. Please attend this appointment to discuss restarting your hydrochlorothiazide, etc.         OPEN WOUND SUB-INSTRUCTIONS:  1) Patient should not immerse wounds and keep wound covered if showering. Wash the wound site seperately with mild soap and water, but do not scrub.  2) Apply (primary wound dressing, NOT WET-TO-DRY), followed by guaze/abd pad, kerlix, ace wrap (if compression is indicated), and tape  3) Dial 911 for emergencies. Mon-Fri, 9am-5pm call the Wound Clinic at [phone], or for evenings/weekends page the Junior In-house Surgery Resident on-call by calling [phone] for any of the following:  -fever >101.64F by mouth  -uncontrolled nausea or vomiting  -pain uncontrolled with prescribed medication  -  inablily to pass stool for 3 days or more  -increased redness, foul smell, new wound formation, undermining of surrounding skin or extensive blackening of the wound bed or skin edges  4) Inspect wound sites once daily, call clinic or page resident on call for spreading redness, purulent discharge, or increasing bleeding or drainage, or for separation of wounds.  5) Do not drive while taking narcotic pain medications.  6) Resume all home medications as listed under discharge medications- please do not resume hydrochlorothiazide until you have followed up with your PCP  7) Complete the entire course of antibiotics.  8) We have scheduled you follow up with your PCP this week. Please attend this appointment to discuss restarting your hydrochlorothiazide, etc.          Future Appointments:  Appointments which have been scheduled for you    Dec 19, 2019 10:00 AM  (Arrive by 9:45 AM)  OFFICE VISIT with [facility] Francesco Sor, MD  [facility] Primary Care at [facility] REGION) [address]  [place] [address]  [phone]      Dec 30, 2019 10:00 AM  (Arrive by 9:45 AM)  DIETICIAN NEW with Luciano Cutter  [facility] Primary Care at [facility] REGION) [address]  [place] [address]  [phone]      Jan 05, 2020  9:40 AM  (Arrive by 9:25 AM)  OFFICE VISIT with Loran Senters, FNP  [facility] Primary Care at [facility] REGION) [address]  [place] [address]  [phone]             Winifred Olive, MD, PGY-1  General Surgery Resident

## 2019-12-16 NOTE — Unmapped (Signed)
Podiatry Inpatient Progress Note    Requesting Attending Physician :  No att. providers found  Service Requesting Consult : Surg Vascular (SRV)    Assessment  S/p right foot fifth toe amputation at the metatarsophalangeal joint level due to osteomyelitis  Sarcoidosis  History of aspergillus pneumonia with left upper lobectomy in 2016  Patient on 2 L of oxygen per minute which has recently increased to 5 L/min  Uncontrolled T2DM and multiple co-morbidities    Principal Problem:    Osteomyelitis of ankle and foot (CMS-HCC)  Active Problems:    Pulmonary sarcoidosis (CMS-HCC)    HTN (hypertension)    Type 2 diabetes mellitus with hyperglycemia (CMS-HCC)    Chronic respiratory failure with hypoxia (CMS-HCC)    Chronic pulmonary aspergillosis (CMS-HCC)    Pulmonary hypertension (CMS-HCC)      Plan  Patient was seen and evaluated during hospital rounds on 12/15/2019  Dressings changed.  Surgical site healing well.  Cultures: Enterobacter susceptible to Cipro.  Okay to discharge on ciprofloxacin.  Patient to change the dressings every other day with Betadine and dry sterile dressings.  Ambulate and surgical shoes with weightbearing as tolerated on heel  Follow-up in podiatry clinic in 2 weeks  Patient to follow-up with pulmonary with [facility] in 1 to 2 days.    Subjective patient was seen and evaluated in hospital rounds.  Denies any pain to the right foot.  Breathing at baseline.  Patient states her breathing is chronically worsening.  She now requires 5 L/min of oxygen.  No fever, nausea, vomiting, chills.      Allergies:  Patient has no known allergies.    Medications:   No medications prior to admission.       Code Status:  Full Code      Objective:   No data found.  I/O this shift:  In: 600 [P.O.:600]  Out: 1500 [Urine:1500]    Labs:  Recent Labs   Lab Units 12/15/19  0823 12/14/19  0817 12/13/19  0740 12/12/19  0715 12/11/19  1621   WBC 10*9/L 5.8 8.5 8.3   < > 5.4   RBC 10*12/L 4.17 4.41 4.52   < > 4.47   HEMOGLOBIN g/dL 16.1* 09.6* 04.5*   < > 12.0   HEMATOCRIT % 36.2 38.1 39.4   < > 38.6   PLATELET COUNT (1) 10*9/L 288 332 297   < > 280   MONO ABS 10*9/L  --   --   --   --  0.4   BASOS ABS 10*9/L  --   --   --   --  0.1    < > = values in this interval not displayed.     Recent Labs   Lab Units 12/15/19  0823   SODIUM mmol/L 135   POTASSIUM mmol/L 5.0*   CHLORIDE mmol/L 97*   CO2 mmol/L 34.0*   BUN mg/dL 21   CREATININE mg/dL 4.09*   EGFR CKD-EPI AA FEMALE mL/min/1.7m2 48*   EGFR CKD-EPI NON-AA FEMALE mL/min/1.36m2 42*   GLUCOSE mg/dL 811     Recent Labs   Lab Units 12/11/19  1621   INR  1.02   PT sec 11.9   APTT sec 30.9   ,   No results in the last week   No results in the last week       Physical Exam:  General: AOx3    Right foot surgical site healing well with sutures and staples.  No necrosis noted  along the incision.  No dehiscence noted.  No drainage noted.  No local signs of infection noted.  Neurovascular and MSK status otherwise unchanged.      Time spent on counseling/coordination of care: 15 Minutes  Total time spent with patient: 15 Minutes

## 2019-12-16 NOTE — Unmapped (Signed)
We [facility]) received a referral on the patient listed above. The referral is for the patient to have (SN, PT) visits based on the patients DX(M86.9 (ICD-10-CM) ??? Osteomyelitis and wound care of the foot)  In order to provide Home health care we must have as per Federal regulations an NP/PA/MD to follow and sign on-going Home health orders.   We are asking if you will follow this patient and sign on-going Home Health orders for this Home Health episode, without a NP/PA MD confirmation we are unable to admit this patient to Home Health       Your help with this would be greatly appreciated, a response to this tele-call/SECURE CHAT will suffice as an answer   If you have any questions or concerns please call me at the number listed below  [phone]

## 2019-12-17 DIAGNOSIS — D869 Sarcoidosis, unspecified: Principal | ICD-10-CM

## 2019-12-17 MED ORDER — KETOCONAZOLE 2 % TOPICAL CREAM
Freq: Two times a day (BID) | TOPICAL | 5 refills | 60.00000 days
Start: 2019-12-17 — End: 2020-12-16

## 2019-12-17 MED ORDER — ALBUTEROL SULFATE HFA 90 MCG/ACTUATION AEROSOL INHALER
RESPIRATORY_TRACT | 3 refills | 0 days | PRN
Start: 2019-12-17 — End: 2020-12-16

## 2019-12-18 MED ORDER — KETOCONAZOLE 2 % TOPICAL CREAM
Freq: Two times a day (BID) | TOPICAL | 5 refills | 60.00000 days | Status: CP
Start: 2019-12-18 — End: 2020-12-17

## 2019-12-18 MED ORDER — ALBUTEROL SULFATE HFA 90 MCG/ACTUATION AEROSOL INHALER
RESPIRATORY_TRACT | 3 refills | 0.00000 days | PRN
Start: 2019-12-18 — End: 2020-12-17

## 2019-12-18 NOTE — Unmapped (Signed)
Received multiple requests for refills on Albuterol inhaler. Three refills sent already and notified pharmacy patient needs to be seen for an appointment with Dr Wellington Hampshire.

## 2019-12-19 ENCOUNTER — Ambulatory Visit: Admit: 2019-12-19 | Discharge: 2019-12-20 | Payer: MEDICARE

## 2019-12-19 DIAGNOSIS — Z794 Long term (current) use of insulin: Secondary | ICD-10-CM

## 2019-12-19 DIAGNOSIS — D869 Sarcoidosis, unspecified: Principal | ICD-10-CM

## 2019-12-19 DIAGNOSIS — I272 Pulmonary hypertension, unspecified: Principal | ICD-10-CM

## 2019-12-19 DIAGNOSIS — M869 Osteomyelitis, unspecified: Principal | ICD-10-CM

## 2019-12-19 DIAGNOSIS — E1165 Type 2 diabetes mellitus with hyperglycemia: Principal | ICD-10-CM

## 2019-12-19 MED ORDER — SILDENAFIL (PULMONARY HYPERTENSION) 20 MG TABLET
ORAL_TABLET | Freq: Three times a day (TID) | ORAL | 11 refills | 30.00000 days | Status: CP
Start: 2019-12-19 — End: ?

## 2019-12-19 MED ORDER — MUPIROCIN 2 % TOPICAL OINTMENT
Freq: Two times a day (BID) | TOPICAL | 2 refills | 0.00000 days | Status: CP
Start: 2019-12-19 — End: 2019-12-26

## 2019-12-19 MED ORDER — ALBUTEROL SULFATE HFA 90 MCG/ACTUATION AEROSOL INHALER
RESPIRATORY_TRACT | 6 refills | 0 days | Status: CP | PRN
Start: 2019-12-19 — End: 2020-12-18

## 2019-12-19 NOTE — Unmapped (Signed)
Subjective:     HPI: Karen Barber is a 54 y.o. female here for osteomyelitis. Of ankle and foot. FU hospital   Chief Complaint   Patient presents with   ??? toe amputation     right small toe 9/23     Here for Hospital FU after  A right 5th toe amputation  She developed osteomyelitis and needed amputation. Became Diabetic due to steroids given for Sarcoid.  Says she is keeping her BS down   She has been keeping them less  than 150  By only eating boiled eggs for the whole day. Scared to eat any thing. Does not want her BS getting too elevated. The amputaion is healing well.     I have reviewed past medical, surgical, medications, allergies, social and family histories today.    Objective:       General: Well developed. Well nourished. In no acute distress.  HEENT:  Normocephalic.  Atraumatic. PERRLA, EOMI     Neck:  No thyroid enlargement, carotid bruits, full range of motion  Heart:  Regular rate and rhythm . No murmurs or gallops  Lungs:  No respiratory distress.  Lungs clear to auscultation bilaterally.  Abdomen: Soft, bowel sounds normal, no tenderness, no organomegaly, no distension  Extremities:  No edema. Peripheral pulses normal  Musculoskeletal: Full ROM of lumbar spine, gait normal, negative SLR  Skin:  Warm, dry, would clean and dry sutures in place no oozing noted  Neuro:  Non-focal. CN II-XII intact.   Psych:  Affect normal, answers questions appropriately, eye contact good, speech clear and coherent.      Review of systems negative unless otherwise noted as per HPI.      Assessment and Plan:     Karen Barber was seen today for toe amputation.    Diagnoses and all orders for this visit:    Osteomyelitis of ankle and foot (CMS-HCC)  -     mupirocin (BACTROBAN) 2 % ointment; Apply topically Two (2) times a day for 7 days.    Sarcoid  -     albuterol HFA 90 mcg/actuation inhaler; Inhale 2 puffs every four (4) hours as needed for wheezing or shortness of breath.    Type 2 diabetes mellitus with hyperglycemia, with long-term current use of insulin (CMS-HCC)    Pulmonary hypertension (CMS-HCC)  -     sildenafiL, pulm.hypertension, (REVATIO) 20 mg tablet; Take 1 tablet (20 mg total) by mouth Three (3) times a day.       Patient  Is here for FU of Hospital stay. We did give her Mupirocin( refill she lost the first tube).  Cont to keep wound clean and dry.FU with Podaitry 12/25/2019  Info on diabetes web site on managing Diabetes given.  Refills given.          [facility]:     Medication adherence and barriers to the treatment plan have been addressed. Opportunities to optimize healthy behaviors have been discussed. Patient / caregiver voiced understanding.      Note - This record has been created using [employer]. Chart creation errors have been sought, but may not always have been located. Such creation errors do not reflect on the standard of medical care.

## 2019-12-25 ENCOUNTER — Ambulatory Visit: Admit: 2019-12-25 | Discharge: 2019-12-26 | Payer: MEDICARE

## 2019-12-25 DIAGNOSIS — Z09 Encounter for follow-up examination after completed treatment for conditions other than malignant neoplasm: Principal | ICD-10-CM

## 2019-12-25 DIAGNOSIS — M869 Osteomyelitis, unspecified: Principal | ICD-10-CM

## 2019-12-25 MED ORDER — LEVOFLOXACIN 500 MG TABLET
ORAL_TABLET | Freq: Every day | ORAL | 0 refills | 14 days | Status: CP
Start: 2019-12-25 — End: 2020-01-08

## 2019-12-25 NOTE — Unmapped (Unsigned)
Nutrition IBT Consult Note    Referring Provider:  Self, Referred    Reason for Referral:  No chief complaint on file.      Patient Presents For:  Obesity    ASSESS -   Medical History:  Patient Active Problem List   Diagnosis   ??? Pulmonary sarcoidosis (CMS-HCC)   ??? HTN (hypertension)   ??? Aspergillus fumigatus (CMS-HCC)   ??? Type 2 diabetes mellitus with hyperglycemia (CMS-HCC)   ??? Bronchiectasis type 1 (CMS-HCC)   ??? Cavitary lesion of lung   ??? Invasive pulmonary aspergillosis (CMS-HCC)   ??? COPD (chronic obstructive pulmonary disease) (CMS-HCC)   ??? Dyspnea on exertion   ??? Chronic respiratory failure with hypoxia (CMS-HCC)   ??? Chronic pulmonary aspergillosis (CMS-HCC)   ??? Eosinophilic asthma   ??? 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 Eosinophilia   ??? DOE (dyspnea on exertion)   ??? Osteomyelitis of ankle and foot (CMS-HCC)   ??? Pulmonary hypertension (CMS-HCC)       Barriers to Care:  {BARRIERS TO UUVO:53664}    Present height    Present weight    Current BMI      Weight Status Categories:  Underweight:  BMI < 18.5  Normal Weight:  BMI 18.5 - 24.9  Overweight:  BMI 25 - 29.9  Obesity Class I:  BMI 30 - 34.9  Obesity Class II:  BMI 35 - 39.9  Obesity Class III:  BMI ? 40    Waist Circumference (for BMI >25 and <35 without diabetes):   12/25/2019 *** in    Weight History:  Wt Readings from Last 6 Encounters:   12/19/19 (!) 107 kg (236 lb)   12/14/19 (!) 110 kg (242 lb 8 oz)   11/11/19 (!) 109.3 kg (241 lb)   08/22/19 (!) 104.3 kg (230 lb)   08/07/19 100.2 kg (221 lb)   05/30/19 (!) 107.6 kg (237 lb 4.8 oz)       Allergies:   No Known Allergies    Relevant Medications, Herbs, Supplements:    Current Outpatient Medications:   ???  ACCU-CHEK FASTCLIX LANCET DRUM Misc, USE TO CHECK GLUCOSE 4 TIMES DAILY BEFORE MEAL(S) AND NIGHTLY, Disp: , Rfl:   ???  ACCU-CHEK GUIDE GLUCOSE METER Misc, 1 Device by Other route Four (4) times a day (before meals and nightly). AS DIRECTED, Disp: 1 kit, Rfl: 0  ???  acetaminophen (TYLENOL) 325 MG tablet, Take 2 tablets (650 mg total) by mouth every four (4) hours as needed., Disp: 30 tablet, Rfl: 1  ???  albuterol 2.5 mg /3 mL (0.083 %) nebulizer solution, Inhale 3 mL (2.5 mg total) by nebulization every six (6) hours as needed for wheezing or shortness of breath (airway clearance/cough)., Disp: 120 mL, Rfl: 0  ???  albuterol HFA 90 mcg/actuation inhaler, Inhale 2 puffs every four (4) hours as needed for wheezing or shortness of breath., Disp: 18 g, Rfl: 6  ???  blood sugar diagnostic Strp, by Other route Three (3) times a day., Disp: 200 strip, Rfl: 2  ???  budesonide-formoteroL (SYMBICORT) 160-4.5 mcg/actuation inhaler, Inhale 2 puffs Two (2) times a day., Disp: 10.2 g, Rfl: 3  ???  cyclobenzaprine (FLEXERIL) 10 MG tablet, Take 1 tablet (10 mg total) by mouth Three (3) times a day as needed., Disp: 90 tablet, Rfl: 0  ???  empty container Misc, USE AS DIRECTED, Disp: 1 each, Rfl: 2  ???  EPINEPHrine (EPIPEN 2-PAK) 0.3 mg/0.3 mL injection, Inject  0.3 mL (0.3 mg total) into the muscle once for 1 dose. (Patient not taking: Reported on 12/19/2019), Disp: 1 Device, Rfl: 1  ???  gabapentin (NEURONTIN) 300 MG capsule, Take 1 capsule (300 mg total) by mouth Three (3) times a day., Disp: 270 capsule, Rfl: 1  ???  insulin ASPART (NOVOLOG U-100 INSULIN ASPART) 100 unit/mL injection, Take 1-20 units SQ QID AC and HS as directed, Disp: 80 mL, Rfl: 1  ???  insulin glargine (BASAGLAR, LANTUS) 100 unit/mL (3 mL) injection pen, Inject 0.18 mL (18 Units total) under the skin daily. 18 units (Patient taking differently: Inject 30 Units under the skin daily. 18 units), Disp: 18 mL, Rfl: 1  ???  ipratropium (ATROVENT) 0.02 % nebulizer solution, Inhale 2.5 mL (500 mcg total) by nebulization Four (4) times a day. (Patient taking differently: Inhale 500 mcg by nebulization Two (2) times a day. ), Disp: 900 mL, Rfl: 1  ???  ketoconazole (NIZORAL) 2 % cream, Apply 1 application topically Two (2) times a day., Disp: 60 g, Rfl: 5  ??? levoFLOXacin (LEVAQUIN) 500 MG tablet, Take 1 tablet (500 mg total) by mouth daily for 14 days., Disp: 14 tablet, Rfl: 0  ???  MEDICAL SUPPLY ITEM, Accucheck Glide Glocumeter for home glucose checks. Check blood glucose ACHS., Disp: 1 Package, Rfl: 0  ???  mepolizumab (NUCALA) 100 mg/mL AtIn, Inject the contents of 1 syringe (100 mg) under the skin every twenty-eight (28) days., Disp: 1 mL, Rfl: 12  ???  metFORMIN (GLUCOPHAGE) 1000 MG tablet, Take 1 tablet (1,000 mg total) by mouth 2 (two) times a day with meals., Disp: 180 tablet, Rfl: 1  ???  miscellaneous medical supply Misc, Nebulizer machine and tubing. Patients machine is 56 years old., Disp: 1 each, Rfl: 0  ???  mupirocin (BACTROBAN) 2 % ointment, Apply topically Two (2) times a day for 7 days., Disp: 30 g, Rfl: 2  ???  OXYGEN-AIR DELIVERY SYSTEMS MISC, Dose: 2-3 LPM, Disp: , Rfl:   ???  posaconazole (NOXAFIL) 100 mg TbEC delayed released tablet, Take 200 mg by mouth daily., Disp: 60 tablet, Rfl: 2  ???  pravastatin (PRAVACHOL) 40 MG tablet, Take 1 tablet (40 mg total) by mouth daily with evening meal., Disp: 90 tablet, Rfl: 1  ???  sildenafiL, pulm.hypertension, (REVATIO) 20 mg tablet, Take 1 tablet (20 mg total) by mouth Three (3) times a day., Disp: 90 tablet, Rfl: 11    Do you have any concerns about taking or affording your medications?  ***    Relevant Labs:  Hemoglobin A1C (%)   Date Value   12/12/2019 10.1 (H)   11/11/2019 9.9 (A)   05/29/2019 9.7 (A)   11/01/2018 9.8 (H)   03/05/2018 8.2 (H)   07/03/2017 7.6 (A)   06/20/2014 10.0 (H)   11/05/2012 7.5 (H)      No components found for: LDLCALC   BP Readings from Last 3 Encounters:   12/25/19 140/86   12/19/19 122/80   12/15/19 105/57     Lab Results   Component Value Date    CHOL 150 11/05/2012     Lab Results   Component Value Date    HDL 42 11/05/2012     No results found for: LDL  No results found for: VLDL  No results found for: CHOLHDLRATIO  No results found for: TRIG    Lab Results   Component Value Date VITDTOTAL 12.9 (L) 05/30/2019     No results found for: [id number]  Blood Glucose Readings   Fasting Lunch Pre-Supper Bedtime   Range (mg/dL) ***      Average (mg/dL)       Meter/Log book present: {yes:28281}  Hypoglycemia: {Yes/No:22953}    Physical Activity:  Type: {misc; exercise types:16438}   Frequency: *** days per week  Duration: *** minutes per day    24-Hour recall/usual intake:    Time Intake   Breakfast ***   Snack (AM) ***   Lunch ***   Snack (PM) ***   Dinner ***   Snack (HS) ***     Other Nutrition Information:  Beverages:  Dining Out:  Allergies/Intolerances:    Initial Visit (12/25/19): Patient presents today for nutrition counseling related to ***. Patient reports ***    Education was provided on ***.     Estimated Daily Needs:  {Energy Needs:57594}  *** g protein  {Nutrient Needs:58116}    Nutrition Diagnosis:  {Nutrition diagnosis:304200134}    ADVISE -  Nutrition Intervention:  {Nutrition Interventions:3047301}    Materials Provided:  {Materials provided ZOXW:960454098}    Social Determinants of Health screened today. Interventions provided: {ECMSDOHDROP:72168}    AGREE - Patient Stated Nutrition Goals:  ***  Goals Added to Visit Navigator?  {goals yes/no:57886}    ASSIST - Monitoring/Evaluation:  Progress towards goals:  {progress toward goals:75879}    Date of Initial Visit:  ***  Weight at Initial Visit:  ***  BMI at Initial Visit: ***  {IBT weight goals:57887}    ARRANGE - Follow-up:  ***    Length of visit was *** minutes  *** unit(s) billed per insurance    Hassan Rowan, RD LDN

## 2019-12-25 NOTE — Unmapped (Signed)
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  Patient's condition was discussed with the patient in detail.  Patient was given opportunity to ask questions.     Surgical site is healing well with sutures intact. No signs of infection noted. Small area of dehiscence noted as patient has been applying ketoconazole over the incision causing maceration.     Dressings: Betadine, steri strips, and DSD applied. Patient to keep steri strips intact, allowing them to fall off naturally. Patient to continue daily dressing changes with betadine, keeping dressings clean, dry, and intact.    Advised patient to stop applying ketoconazole 2% to incision, she may apply to rest of the foot.     Activity: Heel weightbearing to right foot in surgical shoe.    Continue levofloxacin as previously prescribed. Additional 14-day course of levo prescribed today.    We discussed strict return precautions to the ED including redness, swelling, purulent drainage, fever, or chills.     RTC in 2 weeks for suture removal.    All questions were answered and no guarantees were given or implied. Patient expressed understanding.       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 She is on a course of PO levofloxacin 500 mg daily until 10/11. Post-operatively, patient reports that she is doing okay. However, she notes that her oxygen tank does not put out as well as the tanks at the hospital. She is to follow with her pulmonologist next week. She is currently on 5-6 L of oxygen. Regarding her surgical site, patient denies observing any drainage. Patient denies any other complaints. Patient denies nausea, vomiting, fever, chills, SOB and chest pain.     A1c was 9.9% on 11/11/19.    Past Medical History:   Diagnosis Date   ??? 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 Breast injury     lung surg on left incision under breast sept 2016   ??? Caregiver burden     parents    ??? Hepatitis C antibody test positive 2016    repeatedly negative HCV RNA, indicating clearance of infection w/o treatment   ??? Hyperlipidemia    ??? Hypertension    ??? 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 On home O2 2008    per Dr Mal Amabile note from 02/21/2017   ??? Pulmonary aspergillosis (CMS-HCC) 2016    A.fumigatus in 2016, prior to LUL lobectomy. A.niger from sputum 08/2016-09/2016.   ??? 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 Sarcoidosis 1995   ??? Type 2 diabetes mellitus with hyperglycemia (CMS-HCC) 07/27/2014   ??? Visual impairment     glasses       Past Surgical History:   Procedure Laterality Date   ??? LUNG LOBECTOMY     ???  PR AMPUTATION TOE,MT-P JT Right 12/12/2019    Procedure: Right foot fifth toe amputation at the metatarsophalangeal joint level;  Surgeon: Britt Bottom, DPM;  Location: MAIN OR [facility];  Service: Vascular   ??? PR RIGHT HEART CATH O2 SATURATION & CARDIAC OUTPUT N/A 04/29/2019    Procedure: Right Heart Catheterization;  Surgeon: Rosana Hoes, MD;  Location: [facility] CATH;  Service: Cardiology   ??? PR THORACOSCOPY SURG TOT PULM DECORT Left 12/14/2014    Procedure: THORACOSCOPY SURG; W/TOT PULM DECORTIC/PNEUMOLYS;  Surgeon: Evert Kohl, MD;  Location: MAIN OR [facility];  Service: Cardiothoracic   ??? PR THORACOSCOPY W/THERA WEDGE RESEXN INITIAL UNILAT Left 12/03/2014    Procedure: THORACOSCOPY, SURGICAL; WITH THERAPEUTIC WEDGE RESECTION (EG, MASS, NODULE) INITIAL UNILATERAL;  Surgeon: Evert Kohl, MD;  Location: MAIN OR [facility];  Service: Cardiothoracic             No Known Allergies     ROS:   Constitutional:  Denies fever or chills   Eyes:  Denies change in visual acuity   HENT:  Denies nasal congestion or sore throat   Respiratory:  Denies cough or shortness of breath   Cardiovascular:  Denies chest pain or edema   GI:  Denies abdominal pain, nausea, vomiting, bloody stools or diarrhea   GU:  Denies dysuria   Musculoskeletal:  Denies back pain or joint pain   Integument:  Denies rash   Neurologic:  Denies headache, focal weakness or sensory changes   Endocrine:  Denies polyuria or polydipsia   Lymphatic:  Denies swollen glands   Psychiatric:  Denies depression or anxiety       Test Results  Imaging: Pending      Physical:   General Appearance: well nourished, Aox3    Vascular: Stable and unchanged.    Skin: Surgical site is healing well with sutures intact. No signs of infection noted. Small area of dehiscence noted.    Neurological: Protective sensation is diminished bilaterally. Sharp/dull sensation is diminished bilaterally.     MSK: Muscle strength exam deferred due to post-op.          I personally spent 20 minutes face-to-face and non-face-to-face in the care of this patient, which includes all pre, intra, and post visit time on the date of service.      Scribe's Attestation: Otho Perl, DPM obtained and performed the history, physical exam and medical decision making elements that were  entered into the chart. Documentation assistance was provided by me personally, a scribe. Signed by Azalee Course, Scribe, on December 25, 2019 at 8:02 AM.     Documentation assistance provided by the Scribe. I was present during the time the encounter was recorded. The information recorded by the Scribe was done at my direction and has been reviewed and validated by me.

## 2019-12-25 NOTE — Unmapped (Addendum)
WOUND CARE INSTRUCTIONS:      Wash area with Antibacterial liquid soap with one wash cloth, clean the soap off with a clean cloth, and then pat dry with a dry cloth before the dressing change.    Dressing: Today we applied betadine, gauze, roll gauze and a light ace wrap to your surgical site. Keep dressing in place unless it gets wet or soiled.     If you have any questions or concerns regarding your wound or wound care, please contact us at the [facility] clinic at [phone].      If your wound starts to develop the following , please call the [facility] for further advise:    ??  Increased drainage  ??  Redness around the wound  ??  Strong odor from the wound when changing the bandages  ??  Increased pain    Please do not hesitate to leave a voicemail on the nurse line. We make every effort to return your call the same day or the next day. Please leave a clear message with your name, date of birth,  and your medical record number. Leave a brief description of your problem.    If you are experiencing the following, please call us for advise or consider going to the nearest local Emergency Department or call 911.    ??  Fever of 100 F  ??  Nausea or Vomitting  ??  Pus draining from your wound  ??  Redness of the whole foot or leg  ??  Severe increase in pain above your baseline.    [facility]  [phone]

## 2020-01-02 NOTE — Unmapped (Unsigned)
Assessment and Plan:     There are no diagnoses linked to this encounter.     HGB A1c 10.1 less than one month ago).   DM {controlled/uncontrolled:77631}. Continue Lantus 30 units daily, Novolog with sliding scale dosage, and metformin 1000 mg BID.***  Encouraged patient to continue carb controlled diet and regular exercise.     I personally spent *** minutes face-to-face and non-face-to-face in the care of this patient, which includes all pre, intra, and post visit time on the date of service.  Over 50% spent in face to face counseling and education regarding ***.    No follow-ups on file.    HPI:      Karen Barber  is here for No chief complaint on file.    Diabetes: Patient presents {diabetes rfv:14250::for follow up of diabetes.}  A1C goal is <8.  Diabetes has customarily {been/not been:38678} at goal (complicated by: ***).  Current symptoms include: {dm sx:14075}. Symptoms have {symptom progression:19445}. Patient denies {dm sx:19199}. Evaluation to date has included: {dm labs:6845164968}.  Home sugars: {dm home sugars:14018}. Current treatment: {km dm tx:73344} which has been {effective/ineffective:14021}.  Doing regular exercise: {yes no:22180}.     {kerichronicdx:74702}    {keriacutedx:74703}     ROS:      Comprehensive 10 point ROS negative unless otherwise stated in the HPI.      [facility] Components:     Medication adherence and barriers to the treatment plan have been addressed. Opportunities to optimize healthy behaviors have been discussed. Patient / caregiver voiced understanding.    Past Medical/Surgical History:     Past Medical History:   Diagnosis Date   ??? 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 Breast injury     lung surg on left incision under breast sept 2016   ??? Caregiver burden     parents    ??? Hepatitis C antibody test positive 2016    repeatedly negative HCV RNA, indicating clearance of infection w/o treatment   ??? Hyperlipidemia    ??? Hypertension    ??? 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 On home O2 2008    per Dr Mal Amabile note from 02/21/2017   ??? Pulmonary aspergillosis (CMS-HCC) 2016    A.fumigatus in 2016, prior to LUL lobectomy. A.niger from sputum 08/2016-09/2016.   ??? 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 Sarcoidosis 1995   ??? Type 2 diabetes mellitus with hyperglycemia (CMS-HCC) 07/27/2014   ??? Visual impairment     glasses     Past Surgical History:   Procedure Laterality Date   ??? LUNG LOBECTOMY     ??? PR AMPUTATION TOE,MT-P JT Right 12/12/2019    Procedure: Right foot fifth toe amputation at the metatarsophalangeal joint level;  Surgeon: Britt Bottom, DPM;  Location: MAIN [facility];  Service: Vascular   ??? PR RIGHT HEART CATH O2 SATURATION & CARDIAC OUTPUT N/A 04/29/2019    Procedure: Right Heart Catheterization;  Surgeon: Rosana Hoes, MD;  Location: [facility] CATH;  Service: Cardiology   ??? PR THORACOSCOPY SURG TOT PULM DECORT Left 12/14/2014    Procedure: THORACOSCOPY SURG; W/TOT PULM DECORTIC/PNEUMOLYS;  Surgeon: Evert Kohl, MD;  Location: MAIN OR [facility];  Service: Cardiothoracic   ??? PR THORACOSCOPY W/THERA WEDGE RESEXN INITIAL UNILAT Left 12/03/2014    Procedure: THORACOSCOPY, SURGICAL; WITH THERAPEUTIC WEDGE RESECTION (EG, MASS, NODULE) INITIAL  UNILATERAL;  Surgeon: Evert Kohl, MD;  Location: MAIN OR [facility];  Service: Cardiothoracic       Family History:     Family History   Problem Relation Age of Onset   ??? Diabetes Father    ??? Diabetes Brother    ??? Diabetes Maternal Aunt    ??? Sarcoidosis Maternal Aunt    ??? Diabetes Maternal Uncle    ??? Diabetes Paternal Aunt    ??? Diabetes Paternal Uncle        Social History:     Social History     Tobacco Use   ??? Smoking status: Former Smoker     Packs/day: 0.50     Years: 9.00     Pack years: 4.50     Types: Cigarettes     Quit date: 05/03/1987     Years since quitting: 32.6   ??? Smokeless tobacco: Never Used   Vaping Use   ??? Vaping Use: Never used   Substance Use Topics   ??? Alcohol use: No     Alcohol/week: 0.0 standard drinks   ??? Drug use: No       Allergies:     Patient has no known allergies.    Current Medications:     Current Outpatient Medications   Medication Sig Dispense Refill   ??? ACCU-CHEK FASTCLIX LANCET DRUM Misc USE TO CHECK GLUCOSE 4 TIMES DAILY BEFORE MEAL(S) AND NIGHTLY     ??? ACCU-CHEK GUIDE GLUCOSE METER Misc 1 Device by Other route Four (4) times a day (before meals and nightly). AS DIRECTED 1 kit 0   ??? acetaminophen (TYLENOL) 325 MG tablet Take 2 tablets (650 mg total) by mouth every four (4) hours as needed. 30 tablet 1   ??? albuterol 2.5 mg /3 mL (0.083 %) nebulizer solution Inhale 3 mL (2.5 mg total) by nebulization every six (6) hours as needed for wheezing or shortness of breath (airway clearance/cough). 120 mL 0   ??? albuterol HFA 90 mcg/actuation inhaler Inhale 2 puffs every four (4) hours as needed for wheezing or shortness of breath. 18 g 6   ??? blood sugar diagnostic Strp by Other route Three (3) times a day. 200 strip 2   ??? budesonide-formoteroL (SYMBICORT) 160-4.5 mcg/actuation inhaler Inhale 2 puffs Two (2) times a day. 10.2 g 3   ??? cyclobenzaprine (FLEXERIL) 10 MG tablet Take 1 tablet (10 mg total) by mouth Three (3) times a day as needed. 90 tablet 0   ??? empty container Misc USE AS DIRECTED 1 each 2   ??? EPINEPHrine (EPIPEN 2-PAK) 0.3 mg/0.3 mL injection Inject 0.3 mL (0.3 mg total) into the muscle once for 1 dose. (Patient not taking: Reported on 12/19/2019) 1 Device 1   ??? gabapentin (NEURONTIN) 300 MG capsule Take 1 capsule (300 mg total) by mouth Three (3) times a day. 270 capsule 1   ??? insulin ASPART (NOVOLOG U-100 INSULIN ASPART) 100 unit/mL injection Take 1-20 units SQ QID AC and HS as directed 80 mL 1   ??? insulin glargine (BASAGLAR, LANTUS) 100 unit/mL (3 mL) injection pen Inject 0.18 mL (18 Units total) under the skin daily. 18 units (Patient taking differently: Inject 30 Units under the skin daily. 18 units) 18 mL 1   ??? ipratropium (ATROVENT) 0.02 % nebulizer solution Inhale 2.5 mL (500 mcg total) by nebulization Four (4) times a day. (Patient taking differently: Inhale 500 mcg by nebulization Two (2) times a day. ) 900 mL 1   ???  ketoconazole (NIZORAL) 2 % cream Apply 1 application topically Two (2) times a day. 60 g 5   ??? levoFLOXacin (LEVAQUIN) 500 MG tablet Take 1 tablet (500 mg total) by mouth daily for 14 days. 14 tablet 0   ??? MEDICAL SUPPLY ITEM Accucheck Glide Glocumeter for home glucose checks. Check blood glucose ACHS. 1 Package 0   ??? mepolizumab (NUCALA) 100 mg/mL AtIn Inject the contents of 1 syringe (100 mg) under the skin every twenty-eight (28) days. 1 mL 12   ??? metFORMIN (GLUCOPHAGE) 1000 MG tablet Take 1 tablet (1,000 mg total) by mouth 2 (two) times a day with meals. 180 tablet 1   ??? miscellaneous medical supply Misc Nebulizer machine and tubing. Patients machine is 56 years old. 1 each 0   ??? OXYGEN-AIR DELIVERY SYSTEMS MISC Dose: 2-3 LPM     ??? posaconazole (NOXAFIL) 100 mg TbEC delayed released tablet Take 200 mg by mouth daily. 60 tablet 2   ??? pravastatin (PRAVACHOL) 40 MG tablet Take 1 tablet (40 mg total) by mouth daily with evening meal. 90 tablet 1   ??? sildenafiL, pulm.hypertension, (REVATIO) 20 mg tablet Take 1 tablet (20 mg total) by mouth Three (3) times a day. 90 tablet 11     No current facility-administered medications for this visit.       Health Maintenance:     Health Maintenance   Topic Date Due   ??? HPV Cotest with Pap Smear (21-65)  Never done   ??? Pap Smear with Cotest HPV (21-65)  02/27/2001   ??? FIT-DNA Stool Test  Never done   ??? Zoster Vaccines (1 of 2) Never done   ??? DTaP/Tdap/Td Vaccines (2 - Td or Tdap) 07/07/2017   ??? Foot Exam  11/01/2019   ??? Urine Albumin/Creatinine Ratio  11/01/2019   ??? Retinal Eye Exam  11/15/2019   ??? Mammogram Start Age 56  02/29/2020   ??? Hemoglobin A1c  03/12/2020   ??? Serum Creatinine Monitoring  12/14/2020   ??? Potassium Monitoring  12/14/2020   ??? COPD Spirometry  01/04/2022   ??? Hepatitis C Screen  Completed   ??? COVID-19 Vaccine  Completed   ??? Influenza Vaccine  Completed   ??? Pneumococcal Vaccine  Completed       Immunizations:     Immunization History   Administered Date(s) Administered   ??? COVID-19 VACC,MRNA,(PFIZER)(PF)(IM) 06/11/2019, 07/02/2019   ??? INFLUENZA TIV (TRI) PF (IM) 01/02/2005, 12/14/2008   ??? Influenza Vaccine Quad (IIV4 PF) 18mo+ injectable 11/28/2012, 12/19/2014, 12/16/2015, 01/03/2017, 12/06/2017, 01/08/2019, 12/15/2019   ??? PNEUMOCOCCAL POLYSACCHARIDE 23 07/08/2007, 01/03/2017   ??? PPD Test 01/19/2015   ??? TdaP 07/08/2007     I have reviewed and (if needed) updated the patient's problem list, medications, allergies, past medical and surgical history, social and family history.     Vital Signs:     Wt Readings from Last 3 Encounters:   12/19/19 (!) 107 kg (236 lb)   12/14/19 (!) 110 kg (242 lb 8 oz)   11/11/19 (!) 109.3 kg (241 lb)     Temp Readings from Last 3 Encounters:   12/25/19 36.2 ??C (97.2 ??F)   12/19/19 36 ??C (96.8 ??F) (Temporal)   12/15/19 36.4 ??C (97.6 ??F) (Oral)     BP Readings from Last 3 Encounters:   12/25/19 140/86   12/19/19 122/80   12/15/19 105/57     Pulse Readings from Last 3 Encounters:   12/25/19 87   12/19/19 91   12/15/19  94     Estimated body mass index is 32.92 kg/m?? as calculated from the following:    Height as of 12/19/19: 180.3 cm (5' 11).    Weight as of 12/19/19: 107 kg (236 lb).  No height and weight on file for this encounter.      Objective:      General: Alert and oriented x3. Well-appearing. No acute distress. ***  HEENT:  Normocephalic.  Atraumatic. Conjunctiva and sclera normal. OP MMM without lesions. ***  Neck:  Supple. No thyroid enlargement. No adenopathy. ***  Heart:  Regular rate and rhythm . Normal S1, S2.  No murmurs, rubs or gallops. ***  Lungs:  No respiratory distress.  Lungs clear to auscultation. No wheezes, rhonchi, or rales. ***  GI/GU:  Soft, +BS, nondistended, non-TTP. No palpable masses or organomegaly. ***  Extremities:  No edema. Peripheral pulses normal. ***  Skin:  Warm, dry. No rash or lesions present. ***  Neuro:  Non-focal. No obvious weakness. ***  Psych:  Affect normal, eye contact good, speech clear and coherent. ***     I attest that I, Bayard Hugger, personally documented this note while acting as scribe for Noralyn Pick, FNP.      Bayard Hugger, Scribe.  01/05/2020     The documentation recorded by the scribe accurately reflects the service I personally performed and the decisions made by me.    Noralyn Pick, FNP

## 2020-01-09 NOTE — Unmapped (Signed)
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 Patient's condition was discussed with the patient in detail.  Patient was given opportunity to ask questions.     Surgical site has healed well with sutures intact. Sutures removed today; patient tolerated well. No signs of infection noted. No edema. No erythema. No dehiscence. Skin is well co-apted.     Dressings: Betadine, gauze, and tape applied.     Diabetic shoes and custom functional orthotics prescribed. Limbionics information given.    Activity: WBAT in diabetic shoes with custom functional orthotics.     Patient to check feet daily for ulcers.     Patient educated on a healthy diet for diabetes management; she is to follow with a dietitian tomorrow.    Continue application of ketoconazole 2% BID to affected nails and areas of feet.     RTC in 2 months.    All questions were answered and no guarantees were given or implied. Patient expressed understanding.       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 She finished her course of levofloxacin on 10/21. Patient reports that she is doing well today and endorses no pain. Her breathing is better, and she is to see her pulmonologist at [facility] on Friday. She endorses performing daily dressing changes. She also reports that she is following a better diet not for diabetes management. Patient denies any other complaints. Patient denies nausea, vomiting, fever, chills, SOB and chest pain.     A1c was 9.9% on 11/11/19.    Past Medical History:   Diagnosis Date   ??? 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 Breast injury     lung surg on left incision under breast sept 2016   ??? Caregiver burden     parents    ??? Hepatitis C antibody test positive 2016    repeatedly negative HCV RNA, indicating clearance of infection w/o treatment   ??? Hyperlipidemia    ??? Hypertension    ??? 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 On home O2 2008    per Dr Mal Amabile note from 02/21/2017   ??? Pulmonary aspergillosis (CMS-HCC) 2016    A.fumigatus in 2016, prior to LUL lobectomy. A.niger from sputum 08/2016-09/2016.   ??? 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 Sarcoidosis 1995   ??? Type 2 diabetes mellitus with hyperglycemia (CMS-HCC) 07/27/2014   ??? Visual impairment     glasses       Past Surgical History:   Procedure Laterality Date   ??? LUNG LOBECTOMY     ??? PR AMPUTATION TOE,MT-P JT Right 12/12/2019    Procedure: Right foot fifth toe amputation at the metatarsophalangeal joint level;  Surgeon: Britt Bottom,  DPM;  Location: MAIN OR ;  Service: Vascular   ??? PR RIGHT HEART CATH O2 SATURATION & CARDIAC OUTPUT N/A 04/29/2019    Procedure: Right Heart Catheterization;  Surgeon: Rosana Hoes, MD;  Location: [facility] CATH;  Service: Cardiology   ??? PR THORACOSCOPY SURG TOT PULM DECORT Left 12/14/2014    Procedure: THORACOSCOPY SURG; W/TOT PULM DECORTIC/PNEUMOLYS;  Surgeon: Evert Kohl, MD;  Location: MAIN OR [facility];  Service: Cardiothoracic   ??? PR THORACOSCOPY W/THERA WEDGE RESEXN INITIAL UNILAT Left 12/03/2014    Procedure: THORACOSCOPY, SURGICAL; WITH THERAPEUTIC WEDGE RESECTION (EG, MASS, NODULE) INITIAL UNILATERAL;  Surgeon: Evert Kohl, MD;  Location: MAIN OR [facility];  Service: Cardiothoracic             No Known Allergies     ROS:   Constitutional:  Denies fever or chills   Eyes:  Denies change in visual acuity   HENT:  Denies nasal congestion or sore throat   Respiratory:  Denies cough or shortness of breath   Cardiovascular:  Denies chest pain or edema   GI:  Denies abdominal pain, nausea, vomiting, bloody stools or diarrhea   GU:  Denies dysuria   Musculoskeletal:  Denies back pain or joint pain   Integument:  Denies rash   Neurologic:  Denies headache, focal weakness or sensory changes   Endocrine:  Denies polyuria or polydipsia   Lymphatic:  Denies swollen glands   Psychiatric:  Denies depression or anxiety       Test Results  Imaging: Pending      Physical:   General Appearance: well nourished, Aox3    Vascular: Stable and unchanged.    Skin: Surgical site has healed well with sutures intact. Sutures removed today. No signs of infection noted. No edema. No erythema. No dehiscence. Skin is well co-apted.     Dry, flaky skin noted to plantar aspect of bilateral feet.   Nails of are elongated, discolored, distrophic with sunungual debris x 10.    Neurological: Protective sensation is diminished bilaterally. Sharp/dull sensation is diminished bilaterally.     MSK: Muscle strength exam deferred due to post-op.      I personally spent 20 minutes face-to-face and non-face-to-face in the care of this patient, which includes all pre, intra, and post visit time on the date of service.      Scribe's Attestation: Otho Perl, DPM obtained and performed the history, physical exam and medical decision making elements that were  entered into the chart. Documentation assistance was provided by me personally, a scribe. Signed by Azalee Course, Scribe, on January 13, 2020 at 8:01 AM.     Documentation assistance provided by the Scribe. I was present during the time the encounter was recorded. The information recorded by the Scribe was done at my direction and has been reviewed and validated by me.

## 2020-01-09 NOTE — Unmapped (Signed)
[facility] Refill Coordination Note    Specialty Medication(s) to be Shipped:   CF/Pulmonary: -Nucala 100mg /ml  Other medication(s) to be shipped: No additional medications requested for fill at this time     Karen Barber, DOB: 02/07/1966  Phone: [phone] (home) [phone] (work)    All above HIPAA information was verified with patient.     Was a Nurse, learning disability used for this call? No    Completed refill call assessment today to schedule patient's medication shipment from the [facility] [phone]).       Specialty medication(s) and dose(s) confirmed: Regimen is correct and unchanged.   Changes to medications: [name] reports no changes at this time.  Changes to insurance: No  Questions for the pharmacist: No    Confirmed patient received Welcome Packet with first shipment. The patient will receive a drug information handout for each medication shipped and additional FDA Medication Guides as required.       DISEASE/MEDICATION-SPECIFIC INFORMATION        For CF patients: CF [facility] Active? No-not enrolled    SPECIALTY MEDICATION ADHERENCE     Medication Adherence    Patient reported X missed doses in the last month: 0  Specialty Medication: Nucala 100mg /ml  Patient is on additional specialty medications: No  Informant: patient  Reliability of informant: reliable        Nucala 100mg /ml  : 0 days of medicine on hand (Next injection is due 01/15/20)    SHIPPING     Shipping address confirmed in Epic.     Delivery Scheduled: Yes, Expected medication delivery date: 01/14/2020.     Medication will be delivered via Same Day Courier to the prescription address in Epic WAM.    Mariah Harn P Allena Katz   [facility] Specialty Technician

## 2020-01-13 ENCOUNTER — Ambulatory Visit: Admit: 2020-01-13 | Discharge: 2020-01-14 | Payer: MEDICARE

## 2020-01-13 ENCOUNTER — Ambulatory Visit: Admit: 2020-01-13 | Discharge: 2020-01-14 | Payer: MEDICARE | Attending: Registered" | Primary: Registered"

## 2020-01-13 DIAGNOSIS — B353 Tinea pedis: Principal | ICD-10-CM

## 2020-01-13 DIAGNOSIS — E119 Type 2 diabetes mellitus without complications: Principal | ICD-10-CM

## 2020-01-13 DIAGNOSIS — Z09 Encounter for follow-up examination after completed treatment for conditions other than malignant neoplasm: Principal | ICD-10-CM

## 2020-01-13 DIAGNOSIS — B351 Tinea unguium: Principal | ICD-10-CM

## 2020-01-13 NOTE — Unmapped (Unsigned)
Nutrition IBT Consult Note    Referring Provider:  Self, Referred    Reason for Referral:  No chief complaint on file.      Patient Presents For:  Obesity    ASSESS -   Medical History:  Patient Active Problem List   Diagnosis   ??? Pulmonary sarcoidosis (CMS-HCC)   ??? HTN (hypertension)   ??? Aspergillus fumigatus (CMS-HCC)   ??? Type 2 diabetes mellitus with hyperglycemia (CMS-HCC)   ??? Bronchiectasis type 1 (CMS-HCC)   ??? Cavitary lesion of lung   ??? Invasive pulmonary aspergillosis (CMS-HCC)   ??? COPD (chronic obstructive pulmonary disease) (CMS-HCC)   ??? Dyspnea on exertion   ??? Chronic respiratory failure with hypoxia (CMS-HCC)   ??? Chronic pulmonary aspergillosis (CMS-HCC)   ??? Eosinophilic asthma   ??? 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 Eosinophilia   ??? DOE (dyspnea on exertion)   ??? Osteomyelitis of ankle and foot (CMS-HCC)   ??? Pulmonary hypertension (CMS-HCC)       Barriers to Care:  {BARRIERS TO GNFA:21308}    Present height    Present weight    Current BMI      Weight Status Categories:  Underweight:  BMI < 18.5  Normal Weight:  BMI 18.5 - 24.9  Overweight:  BMI 25 - 29.9  Obesity Class I:  BMI 30 - 34.9  Obesity Class II:  BMI 35 - 39.9  Obesity Class III:  BMI ? 40    Waist Circumference (for BMI >25 and <35 without diabetes):   01/13/2020 *** in    Weight History:  Wt Readings from Last 6 Encounters:   12/19/19 (!) 107 kg (236 lb)   12/14/19 (!) 110 kg (242 lb 8 oz)   11/11/19 (!) 109.3 kg (241 lb)   08/22/19 (!) 104.3 kg (230 lb)   08/07/19 100.2 kg (221 lb)   05/30/19 (!) 107.6 kg (237 lb 4.8 oz)       Allergies:   No Known Allergies    Relevant Medications, Herbs, Supplements:    Current Outpatient Medications:   ???  ACCU-CHEK FASTCLIX LANCET DRUM Misc, USE TO CHECK GLUCOSE 4 TIMES DAILY BEFORE MEAL(S) AND NIGHTLY, Disp: , Rfl:   ???  ACCU-CHEK GUIDE GLUCOSE METER Misc, 1 Device by Other route Four (4) times a day (before meals and nightly). AS DIRECTED, Disp: 1 kit, Rfl: 0  ???  acetaminophen (TYLENOL) 325 MG tablet, Take 2 tablets (650 mg total) by mouth every four (4) hours as needed., Disp: 30 tablet, Rfl: 1  ???  albuterol 2.5 mg /3 mL (0.083 %) nebulizer solution, Inhale 3 mL (2.5 mg total) by nebulization every six (6) hours as needed for wheezing or shortness of breath (airway clearance/cough)., Disp: 120 mL, Rfl: 0  ???  albuterol HFA 90 mcg/actuation inhaler, Inhale 2 puffs every four (4) hours as needed for wheezing or shortness of breath., Disp: 18 g, Rfl: 6  ???  blood sugar diagnostic Strp, by Other route Three (3) times a day., Disp: 200 strip, Rfl: 2  ???  budesonide-formoteroL (SYMBICORT) 160-4.5 mcg/actuation inhaler, Inhale 2 puffs Two (2) times a day., Disp: 10.2 g, Rfl: 3  ???  cyclobenzaprine (FLEXERIL) 10 MG tablet, Take 1 tablet (10 mg total) by mouth Three (3) times a day as needed., Disp: 90 tablet, Rfl: 0  ???  empty container Misc, USE AS DIRECTED, Disp: 1 each, Rfl: 2  ???  EPINEPHrine (EPIPEN 2-PAK) 0.3 mg/0.3 mL injection, Inject  0.3 mL (0.3 mg total) into the muscle once for 1 dose. (Patient not taking: Reported on 12/19/2019), Disp: 1 Device, Rfl: 1  ???  gabapentin (NEURONTIN) 300 MG capsule, Take 1 capsule (300 mg total) by mouth Three (3) times a day., Disp: 270 capsule, Rfl: 1  ???  insulin ASPART (NOVOLOG U-100 INSULIN ASPART) 100 unit/mL injection, Take 1-20 units SQ QID AC and HS as directed, Disp: 80 mL, Rfl: 1  ???  insulin glargine (BASAGLAR, LANTUS) 100 unit/mL (3 mL) injection pen, Inject 0.18 mL (18 Units total) under the skin daily. 18 units (Patient taking differently: Inject 30 Units under the skin daily. 18 units), Disp: 18 mL, Rfl: 1  ???  ipratropium (ATROVENT) 0.02 % nebulizer solution, Inhale 2.5 mL (500 mcg total) by nebulization Four (4) times a day. (Patient taking differently: Inhale 500 mcg by nebulization Two (2) times a day. ), Disp: 900 mL, Rfl: 1  ???  ketoconazole (NIZORAL) 2 % cream, Apply 1 application topically Two (2) times a day., Disp: 60 g, Rfl: 5  ???  MEDICAL SUPPLY ITEM, Accucheck Glide Glocumeter for home glucose checks. Check blood glucose ACHS., Disp: 1 Package, Rfl: 0  ???  mepolizumab (NUCALA) 100 mg/mL AtIn, Inject the contents of 1 syringe (100 mg) under the skin every twenty-eight (28) days., Disp: 1 mL, Rfl: 12  ???  metFORMIN (GLUCOPHAGE) 1000 MG tablet, Take 1 tablet (1,000 mg total) by mouth 2 (two) times a day with meals., Disp: 180 tablet, Rfl: 1  ???  miscellaneous medical supply Misc, Nebulizer machine and tubing. Patients machine is 56 years old., Disp: 1 each, Rfl: 0  ???  OXYGEN-AIR DELIVERY SYSTEMS MISC, Dose: 2-3 LPM, Disp: , Rfl:   ???  posaconazole (NOXAFIL) 100 mg TbEC delayed released tablet, Take 200 mg by mouth daily., Disp: 60 tablet, Rfl: 2  ???  pravastatin (PRAVACHOL) 40 MG tablet, Take 1 tablet (40 mg total) by mouth daily with evening meal., Disp: 90 tablet, Rfl: 1  ???  sildenafiL, pulm.hypertension, (REVATIO) 20 mg tablet, Take 1 tablet (20 mg total) by mouth Three (3) times a day., Disp: 90 tablet, Rfl: 11    Do you have any concerns about taking or affording your medications?  ***    Relevant Labs:  Hemoglobin A1C (%)   Date Value   12/12/2019 10.1 (H)   11/11/2019 9.9 (A)   05/29/2019 9.7 (A)   11/01/2018 9.8 (H)   03/05/2018 8.2 (H)   07/03/2017 7.6 (A)   06/20/2014 10.0 (H)   11/05/2012 7.5 (H)      No components found for: LDLCALC   BP Readings from Last 3 Encounters:   12/25/19 140/86   12/19/19 122/80   12/15/19 105/57     Lab Results   Component Value Date    CHOL 150 11/05/2012     Lab Results   Component Value Date    HDL 42 11/05/2012     No results found for: LDL  No results found for: VLDL  No results found for: CHOLHDLRATIO  No results found for: TRIG    Lab Results   Component Value Date    VITDTOTAL 12.9 (L) 05/30/2019     No results found for: VITAMINB12    Blood Glucose Readings   Fasting Lunch Pre-Supper Bedtime   Range (mg/dL) ***      Average (mg/dL)       Meter/Log book present: {yes:28281}  Hypoglycemia: {Yes/No:22953}    Physical Activity:  Type: {misc;  exercise types:16438}   Frequency: *** days per week  Duration: *** minutes per day    24-Hour recall/usual intake:    Time Intake   Breakfast ***   Snack (AM) ***   Lunch ***   Snack (PM) ***   Dinner ***   Snack (HS) ***     Other Nutrition Information:  Beverages:  Dining Out:  Allergies/Intolerances:    Initial Visit (01/13/20): Patient presents today for nutrition counseling related to ***. Patient reports ***    Education was provided on ***.     Estimated Daily Needs:  {Energy Needs:57594}  *** g protein  {Nutrient Needs:58116}    Nutrition Diagnosis:  {Nutrition diagnosis:304200134}    ADVISE -  Nutrition Intervention:  {Nutrition Interventions:3047301}    Materials Provided:  {Materials provided ZOXW:960454098}    Social Determinants of Health screened today. Interventions provided: {ECMSDOHDROP:72168}    AGREE - Patient Stated Nutrition Goals:  ***  Goals Added to Visit Navigator?  {goals yes/no:57886}    ASSIST - Monitoring/Evaluation:  Progress towards goals:  {progress toward goals:75879}    Date of Initial Visit:  ***  Weight at Initial Visit:  ***  BMI at Initial Visit: ***  {IBT weight goals:57887}    ARRANGE - Follow-up:  ***    Length of visit was *** minutes  *** unit(s) billed per insurance    Hassan Rowan, RD LDN

## 2020-01-13 NOTE — Unmapped (Signed)
Custom functional orthotics and diabetic shoes were prescribed for you today. These are attainable at [facility]:    Call and make an appointment    [facility]  [address], [address], [place]  [phone]    [facility] [place]  Address: [address], [place], [place] [address]  Phone: [phone]    [facility] [place]  Address: [address] [place], [place] [address]  Phone: [phone]    [facility]  [address]  [place], [place] [address]  Phone: [phone]      [facility]  [address]  [place], [place] [address]  Phone: [phone]      [facility] [place]  [address] [place], [place] [address]  Phone: [phone]

## 2020-01-14 MED FILL — NUCALA 100 MG/ML SUBCUTANEOUS AUTO-INJECTOR: 28 days supply | Qty: 1 | Fill #6 | Status: AC

## 2020-01-14 MED FILL — NUCALA 100 MG/ML SUBCUTANEOUS AUTO-INJECTOR: ORAL | 28 days supply | Qty: 1 | Fill #6

## 2020-01-15 NOTE — Unmapped (Signed)
Assessment and Plan:     Karen Barber was seen today for follow-up.    Diagnoses and all orders for this visit:    Type 2 diabetes mellitus with hyperglycemia, with long-term current use of insulin (CMS-HCC)  HGB A1c 10.1 one month ago. A1c deferred today due to <3 months from previous.   Continue Lantus 24 units daily, Novolog with sliding scale dosage, and metformin 1000 mg BID.  Encouraged patient to continue carb controlled diet and regular exercise.   -     Albumin/creatinine urine ratio  -     blood sugar diagnostic Strp; by Other route Three (3) times a day. ACCU-CHEK Guide meter  -     pen needle, diabetic (BD ULTRA-FINE NANO PEN NEEDLE) 32 gauge x 5/32 (4 mm) Ndle; Inject 100 each under the skin daily. Needles for insulin pen. Inject daily as instructed. 250.00    Renal impairment  -     Basic Metabolic Panel    Chronic obstructive pulmonary disease, unspecified COPD type (CMS-HCC)  Managed by pulmonology. Continue treatment plan as directed by specialist.   Advised to contact specialist to schedule sooner follow up appt.   -     albuterol 2.5 mg /3 mL (0.083 %) nebulizer solution; Inhale 3 mL (2.5 mg total) by nebulization every six (6) hours as needed for wheezing or shortness of breath (airway clearance/cough).    Sarcoid  Managed by pulmonology. Continue treatment plan as directed by specialist. Refilled albuterol rescue inhaler.  Advised to contact specialist to schedule sooner follow up appt.   -     albuterol 2.5 mg /3 mL (0.083 %) nebulizer solution; Inhale 3 mL (2.5 mg total) by nebulization every six (6) hours as needed for wheezing or shortness of breath (airway clearance/cough).    Screening for colorectal cancer  Due - ColoGuard ordered. Patient to complete.   -     Colorectal Cancer DNA + FIT    Other orders  Refilled cyclobenzaprine, continue as directed.   -     cyclobenzaprine (FLEXERIL) 10 MG tablet; Take 1 tablet (10 mg total) by mouth Three (3) times a day as needed.    Return in about 2 months (around 03/20/2020) for follow up with HGB A1c .    HPI:      Karen Barber  is here for   Chief Complaint   Patient presents with   ??? Follow-up     One month F/U.  Doing well.     Diabetes: Patient presents for follow up of diabetes.  A1C goal is <8.  Diabetes has customarily not been at goal (complicated by: steroids, multiple chronic conditions). Current symptoms include: foot ulcerations. Symptoms have improved since surgery. Patient denies hyperglycemia, hypoglycemia , increased appetite, nausea, paresthesia of the feet, polydipsia, polyuria, visual disturbances, vomiting and weight loss. Evaluation to date has included: hemoglobin A1C.  Home sugars: BGs range between 115 and 130. Current treatment: medication: insulin and metformin which has been too early to assess effectiveness.  Doing regular exercise: no.  She has increased daily water intake - now drinking 12 oz every 2 hours. Edema has improved with increasing water intake, which she attributes to more frequent urination.    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 She reports she is doing well post-op. The amputation is healing well.    Sarcoidosis: Patient reports worsening chronic dyspnea. She has continued home oxygen, but is now on 6-8  L per nasal cannula (previously 5-6 L per nasal cannula). She is using nebulizer QID. She is no longer on prednisone. Patient has continued following with pulmonology. Last follow up 01/16/20. She states provider advised follow up in 4 months, but soonest appt available was 04/23/20. Patient reports she sent MyChart message few days ago asking if there was a sooner appt with another provider.    Patient requests refill of cyclobenzaprine.      ROS:      Comprehensive 10 point ROS negative unless otherwise stated in the HPI.      [facility] Components:     Medication adherence and barriers to the treatment plan have been addressed. Opportunities to optimize healthy behaviors have been discussed. Patient / caregiver voiced understanding.    Past Medical/Surgical History:     Past Medical History:   Diagnosis Date   ??? 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 Breast injury     lung surg on left incision under breast sept 2016   ??? Caregiver burden     parents    ??? Hepatitis C antibody test positive 2016    repeatedly negative HCV RNA, indicating clearance of infection w/o treatment   ??? Hyperlipidemia    ??? Hypertension    ??? 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 On home O2 2008    per Dr Mal Amabile note from 02/21/2017   ??? Pulmonary aspergillosis (CMS-HCC) 2016    A.fumigatus in 2016, prior to LUL lobectomy. A.niger from sputum 08/2016-09/2016.   ??? 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 Sarcoidosis 1995   ??? Type 2 diabetes mellitus with hyperglycemia (CMS-HCC) 07/27/2014   ??? Visual impairment     glasses     Past Surgical History:   Procedure Laterality Date   ??? LUNG LOBECTOMY     ??? PR AMPUTATION TOE,MT-P JT Right 12/12/2019    Procedure: Right foot fifth toe amputation at the metatarsophalangeal joint level;  Surgeon: Britt Bottom, DPM;  Location: MAIN OR [facility];  Service: Vascular   ??? PR RIGHT HEART CATH O2 SATURATION & CARDIAC OUTPUT N/A 04/29/2019    Procedure: Right Heart Catheterization;  Surgeon: Rosana Hoes, MD;  Location: [facility] CATH;  Service: Cardiology   ??? PR THORACOSCOPY SURG TOT PULM DECORT Left 12/14/2014    Procedure: THORACOSCOPY SURG; W/TOT PULM DECORTIC/PNEUMOLYS;  Surgeon: Evert Kohl, MD;  [facility];  Service: Cardiothoracic   ??? PR THORACOSCOPY W/THERA WEDGE RESEXN INITIAL UNILAT Left 12/03/2014    Procedure: THORACOSCOPY, SURGICAL; WITH THERAPEUTIC WEDGE RESECTION (EG, MASS, NODULE) INITIAL UNILATERAL;  Surgeon: Evert Kohl, MD;  Location: MAIN OR [facility];  Service: Cardiothoracic       Family History: Family History   Problem Relation Age of Onset   ??? Diabetes Father    ??? Diabetes Brother    ??? Diabetes Maternal Aunt    ??? Sarcoidosis Maternal Aunt    ??? Diabetes Maternal Uncle    ??? Diabetes Paternal Aunt    ??? Diabetes Paternal Uncle        Social History:     Social History     Tobacco Use   ??? Smoking status: Former Smoker     Packs/day: 0.50     Years: 9.00     Pack years: 4.50     Types: Cigarettes  Quit date: 05/03/1987     Years since quitting: 32.7   ??? Smokeless tobacco: Never Used   Vaping Use   ??? Vaping Use: Never used   Substance Use Topics   ??? Alcohol use: No     Alcohol/week: 0.0 standard drinks   ??? Drug use: No       Allergies:     Patient has no known allergies.    Current Medications:     Current Outpatient Medications   Medication Sig Dispense Refill   ??? ACCU-CHEK FASTCLIX LANCET DRUM Misc USE TO CHECK GLUCOSE 4 TIMES DAILY BEFORE MEAL(S) AND NIGHTLY     ??? ACCU-CHEK GUIDE GLUCOSE METER Misc 1 Device by Other route Four (4) times a day (before meals and nightly). AS DIRECTED 1 kit 0   ??? acetaminophen (TYLENOL) 325 MG tablet Take 2 tablets (650 mg total) by mouth every four (4) hours as needed. 30 tablet 1   ??? albuterol 2.5 mg /3 mL (0.083 %) nebulizer solution Inhale 3 mL (2.5 mg total) by nebulization every six (6) hours as needed for wheezing or shortness of breath (airway clearance/cough). 120 mL 0   ??? albuterol HFA 90 mcg/actuation inhaler Inhale 2 puffs every four (4) hours as needed for wheezing or shortness of breath. 18 g 6   ??? blood sugar diagnostic Strp by Other route Three (3) times a day. ACCU-CHEK Guide meter 200 strip 2   ??? budesonide-formoteroL (SYMBICORT) 160-4.5 mcg/actuation inhaler Inhale 2 puffs Two (2) times a day. 10.2 g 3   ??? cyclobenzaprine (FLEXERIL) 10 MG tablet Take 1 tablet (10 mg total) by mouth Three (3) times a day as needed. 90 tablet 0   ??? gabapentin (NEURONTIN) 300 MG capsule Take 1 capsule (300 mg total) by mouth Three (3) times a day. 270 capsule 1   ??? hydroCHLOROthiazide (HYDRODIURIL) 25 MG tablet Take 25 mg by mouth daily.     ??? insulin ASPART (NOVOLOG U-100 INSULIN ASPART) 100 unit/mL injection Take 1-20 units SQ QID AC and HS as directed 80 mL 1   ??? insulin glargine (BASAGLAR, LANTUS) 100 unit/mL (3 mL) injection pen Inject 0.18 mL (18 Units total) under the skin daily. 18 units (Patient taking differently: Inject 24 Units under the skin daily. 18 units) 18 mL 1   ??? ipratropium (ATROVENT) 0.02 % nebulizer solution Inhale 2.5 mL (500 mcg total) by nebulization Four (4) times a day. (Patient taking differently: Inhale 500 mcg by nebulization Two (2) times a day. ) 900 mL 1   ??? ketoconazole (NIZORAL) 2 % cream Apply 1 application topically Two (2) times a day. 60 g 5   ??? MEDICAL SUPPLY ITEM Accucheck Glide Glocumeter for home glucose checks. Check blood glucose ACHS. 1 Package 0   ??? mepolizumab (NUCALA) 100 mg/mL AtIn Inject the contents of 1 syringe (100 mg) under the skin every twenty-eight (28) days. 1 mL 12   ??? metFORMIN (GLUCOPHAGE) 1000 MG tablet Take 1 tablet (1,000 mg total) by mouth 2 (two) times a day with meals. 180 tablet 1   ??? miscellaneous medical supply Misc Nebulizer machine and tubing. Patients machine is 56 years old. 1 each 0   ??? OXYGEN-AIR DELIVERY SYSTEMS MISC Dose: 2-3 LPM     ??? posaconazole (NOXAFIL) 100 mg TbEC delayed released tablet Take 200 mg by mouth daily. 60 tablet 2   ??? pravastatin (PRAVACHOL) 40 MG tablet Take 1 tablet (40 mg total) by mouth daily with evening meal. 90 tablet  1   ??? sildenafiL, pulm.hypertension, (REVATIO) 20 mg tablet Take 1 tablet (20 mg total) by mouth Three (3) times a day. 90 tablet 11   ??? empty container Misc USE AS DIRECTED 1 each 2   ??? EPINEPHrine (EPIPEN 2-PAK) 0.3 mg/0.3 mL injection Inject 0.3 mL (0.3 mg total) into the muscle once for 1 dose. (Patient not taking: Reported on 12/19/2019) 1 Device 1   ??? pen needle, diabetic (BD ULTRA-FINE NANO PEN NEEDLE) 32 gauge x 5/32 (4 mm) Ndle Inject 100 each under the skin daily. Needles for insulin pen. Inject daily as instructed. 250.00 100 each 3     No current facility-administered medications for this visit.       Health Maintenance:     Health Maintenance   Topic Date Due   ??? HPV Cotest with Pap Smear (21-65)  Never done   ??? Pap Smear with Cotest HPV (21-65)  02/27/2001   ??? FIT-DNA Stool Test  Never done   ??? Zoster Vaccines (1 of 2) Never done   ??? DTaP/Tdap/Td Vaccines (2 - Td or Tdap) 07/07/2017   ??? Urine Albumin/Creatinine Ratio  11/01/2019   ??? Retinal Eye Exam  11/15/2019   ??? Mammogram Start Age 46  02/29/2020   ??? Hemoglobin A1c  03/12/2020   ??? Serum Creatinine Monitoring  12/14/2020   ??? Potassium Monitoring  12/14/2020   ??? Foot Exam  01/18/2021   ??? COPD Spirometry  01/04/2022   ??? Hepatitis C Screen  Completed   ??? COVID-19 Vaccine  Completed   ??? Influenza Vaccine  Completed   ??? Pneumococcal Vaccine  Completed       Immunizations:     Immunization History   Administered Date(s) Administered   ??? COVID-19 VACC,MRNA,(PFIZER)(PF)(IM) 06/11/2019, 07/02/2019, 11/21/2019   ??? INFLUENZA TIV (TRI) PF (IM) 01/02/2005, 12/14/2008   ??? Influenza Vaccine Quad (IIV4 PF) 81mo+ injectable 11/28/2012, 12/19/2014, 12/16/2015, 01/03/2017, 12/06/2017, 01/08/2019, 12/15/2019   ??? PNEUMOCOCCAL POLYSACCHARIDE 23 07/08/2007, 01/03/2017   ??? PPD Test 01/19/2015   ??? TdaP 07/08/2007     I have reviewed and (if needed) updated the patient's problem list, medications, allergies, past medical and surgical history, social and family history.     Vital Signs:     Wt Readings from Last 3 Encounters:   01/19/20 97.1 kg (214 lb)   01/13/20 (!) 107 kg (236 lb)   12/19/19 (!) 107 kg (236 lb)     Temp Readings from Last 3 Encounters:   01/19/20 36.6 ??C (97.8 ??F) (Oral)   01/13/20 36.7 ??C (98.1 ??F) (Temporal)   12/25/19 36.2 ??C (97.2 ??F)     BP Readings from Last 3 Encounters:   01/19/20 120/76   01/13/20 149/89   12/25/19 140/86     Pulse Readings from Last 3 Encounters:   01/19/20 82   01/13/20 101   12/25/19 87     Estimated body mass index is 29.85 kg/m?? as calculated from the following:    Height as of this encounter: 180.3 cm (5' 11).    Weight as of this encounter: 97.1 kg (214 lb).  Facility age limit for growth percentiles is 20 years.      Objective:      General: Alert and oriented x3. Well-appearing. No acute distress.   HEENT:  Normocephalic.  Atraumatic. Conjunctiva and sclera normal. OP MMM without lesions.   Neck:  Supple. No thyroid enlargement. No adenopathy.   Heart:  Regular rate and rhythm . Normal S1,  S2.  No murmurs, rubs or gallops.   Lungs:  No respiratory distress.  Lungs clear to auscultation. No rhonchi, or rales. Wheezes to right upper posterior lobe. Using portable oxygen.   GI/GU:  Soft, +BS, nondistended, non-TTP.   Extremities:  No edema. Peripheral pulses normal.   Skin:  Warm, dry. No rash or lesions present.   Neuro:  Non-focal. No obvious weakness.   Psych:  Affect normal, eye contact good, speech clear and coherent.      I attest that I, Bayard Hugger, personally documented this note while acting as scribe for Noralyn Pick, FNP.      Bayard Hugger, Scribe.  01/19/2020     The documentation recorded by the scribe accurately reflects the service I personally performed and the decisions made by me.    Noralyn Pick, FNP

## 2020-01-19 ENCOUNTER — Ambulatory Visit: Admit: 2020-01-19 | Discharge: 2020-01-20 | Payer: MEDICARE | Attending: Family | Primary: Family

## 2020-01-19 DIAGNOSIS — Z1212 Encounter for screening for malignant neoplasm of rectum: Principal | ICD-10-CM

## 2020-01-19 DIAGNOSIS — D869 Sarcoidosis, unspecified: Principal | ICD-10-CM

## 2020-01-19 DIAGNOSIS — Z1211 Encounter for screening for malignant neoplasm of colon: Principal | ICD-10-CM

## 2020-01-19 DIAGNOSIS — Z794 Long term (current) use of insulin: Principal | ICD-10-CM

## 2020-01-19 DIAGNOSIS — J449 Chronic obstructive pulmonary disease, unspecified: Principal | ICD-10-CM

## 2020-01-19 DIAGNOSIS — E1165 Type 2 diabetes mellitus with hyperglycemia: Principal | ICD-10-CM

## 2020-01-19 DIAGNOSIS — N289 Disorder of kidney and ureter, unspecified: Principal | ICD-10-CM

## 2020-01-19 LAB — BASIC METABOLIC PANEL
ANION GAP: 3 mmol/L — ABNORMAL LOW (ref 5–14)
BLOOD UREA NITROGEN: 19 mg/dL (ref 9–23)
BUN / CREAT RATIO: 18
CALCIUM: 9.4 mg/dL (ref 8.7–10.4)
CHLORIDE: 103 mmol/L (ref 98–107)
CO2: 34 mmol/L — ABNORMAL HIGH (ref 20.0–31.0)
CREATININE: 1.08 mg/dL — ABNORMAL HIGH
EGFR CKD-EPI AA FEMALE: 66 mL/min/{1.73_m2} (ref >=60)
EGFR CKD-EPI NON-AA FEMALE: 58 mL/min/{1.73_m2} — ABNORMAL LOW (ref >=60)
POTASSIUM: 4.1 mmol/L (ref 3.4–4.5)

## 2020-01-19 LAB — CO2: Carbon dioxide:SCnc:Pt:Ser/Plas:Qn:: 34 — ABNORMAL HIGH

## 2020-01-19 MED ORDER — ALBUTEROL SULFATE 2.5 MG/3 ML (0.083 %) SOLUTION FOR NEBULIZATION
Freq: Four times a day (QID) | RESPIRATORY_TRACT | 0 refills | 10 days | Status: CP | PRN
Start: 2020-01-19 — End: ?

## 2020-01-19 MED ORDER — BLOOD SUGAR DIAGNOSTIC STRIPS
ORAL_STRIP | Freq: Three times a day (TID) | 2 refills | 0 days | Status: CP
Start: 2020-01-19 — End: 2021-01-19

## 2020-01-19 MED ORDER — PEN NEEDLE, DIABETIC 32 GAUGE X 5/32" (4 MM)
Freq: Every day | SUBCUTANEOUS | 3 refills | 1 days | Status: CP
Start: 2020-01-19 — End: 2020-04-18

## 2020-01-19 MED ORDER — CYCLOBENZAPRINE 10 MG TABLET
ORAL_TABLET | Freq: Three times a day (TID) | ORAL | 0 refills | 30 days | Status: CP | PRN
Start: 2020-01-19 — End: 2021-01-18

## 2020-01-20 LAB — ALBUMIN/CREATININE RATIO: Albumin/Creatinine:MRto:Pt:Urine:Qn:: 192.5 — ABNORMAL HIGH

## 2020-01-20 LAB — ALBUMIN / CREATININE URINE RATIO
ALBUMIN QUANT URINE: 25.8 mg/dL
ALBUMIN/CREATININE RATIO: 192.5 ug/mg — ABNORMAL HIGH (ref 0.0–30.0)

## 2020-02-06 NOTE — Unmapped (Signed)
[facility] Refill Coordination Note    Specialty Medication(s) to be Shipped:   CF/Pulmonary: -Nucala 100mg /ml  Other medication(s) to be shipped: No additional medications requested for fill at this time     Karen Barber, DOB: [birth year]  Phone: [phone] (home) [phone] (work)    All above HIPAA information was verified with patient.     Was a Nurse, learning disability used for this call? No    Completed refill call assessment today to schedule patient's medication shipment from the [facility] [phone]).       Specialty medication(s) and dose(s) confirmed: Regimen is correct and unchanged.   Changes to medications: Karen Barber reports no changes at this time.  Changes to insurance: No  Questions for the pharmacist: No    Confirmed patient received Welcome Packet with first shipment. The patient will receive a drug information handout for each medication shipped and additional FDA Medication Guides as required.       DISEASE/MEDICATION-SPECIFIC INFORMATION        For CF patients: CF [facility] Active? No-not enrolled    SPECIALTY MEDICATION ADHERENCE     Medication Adherence    Patient reported X missed doses in the last month: 0  Specialty Medication: Nucala 100mg /ml  Patient is on additional specialty medications: No  Informant: patient  Reliability of informant: reliable        Nucala 100mg /ml : 0 days of medicine on hand (Next injection 02/15/20)    SHIPPING     Shipping address confirmed in Epic.     Delivery Scheduled: Yes, Expected medication delivery date: 02/11/2020.     Medication will be delivered via Same Day Courier to the prescription address in Epic WAM.    Karen Barber   [facility] Specialty Technician

## 2020-02-11 MED FILL — NUCALA 100 MG/ML SUBCUTANEOUS AUTO-INJECTOR: ORAL | 28 days supply | Qty: 1 | Fill #7

## 2020-02-11 MED FILL — NUCALA 100 MG/ML SUBCUTANEOUS AUTO-INJECTOR: 28 days supply | Qty: 1 | Fill #7 | Status: AC

## 2020-02-18 MED ORDER — PREDNISONE 5 MG TABLET
Freq: Every day | ORAL | 0 days
Start: 2020-02-18 — End: ?

## 2020-02-23 DIAGNOSIS — Z1231 Encounter for screening mammogram for malignant neoplasm of breast: Principal | ICD-10-CM

## 2020-02-25 DIAGNOSIS — E1165 Type 2 diabetes mellitus with hyperglycemia: Principal | ICD-10-CM

## 2020-02-25 DIAGNOSIS — Z794 Long term (current) use of insulin: Principal | ICD-10-CM

## 2020-02-25 DIAGNOSIS — J418 Mixed simple and mucopurulent chronic bronchitis: Principal | ICD-10-CM

## 2020-02-25 DIAGNOSIS — B441 Other pulmonary aspergillosis: Principal | ICD-10-CM

## 2020-02-25 MED ORDER — POSACONAZOLE 100 MG TABLET,DELAYED RELEASE
ORAL_TABLET | Freq: Every day | ORAL | 2 refills | 30 days
Start: 2020-02-25 — End: ?

## 2020-02-25 MED ORDER — BUDESONIDE-FORMOTEROL HFA 160 MCG-4.5 MCG/ACTUATION AEROSOL INHALER
Freq: Two times a day (BID) | RESPIRATORY_TRACT | 3 refills | 15.00000 days
Start: 2020-02-25 — End: 2020-05-25

## 2020-02-26 MED ORDER — BUDESONIDE-FORMOTEROL HFA 160 MCG-4.5 MCG/ACTUATION AEROSOL INHALER
Freq: Two times a day (BID) | RESPIRATORY_TRACT | 3 refills | 15 days | Status: CP
Start: 2020-02-26 — End: 2020-05-26

## 2020-02-26 MED ORDER — BLOOD SUGAR DIAGNOSTIC STRIPS
ORAL_STRIP | Freq: Three times a day (TID) | 2 refills | 0 days
Start: 2020-02-26 — End: 2021-02-26

## 2020-02-26 NOTE — Unmapped (Signed)
Routing symbicort refill request to Dr.Al-Qadi to complete if appropriate.  I sent schedulers a message requesting that pt schedule a visit with Dr. Wellington [place].

## 2020-02-26 NOTE — Unmapped (Signed)
-----   Message from Vicente Masson sent at 02/26/2020 10:55 AM EST -----  Regarding: RE: appointment  Patient states she now sees a pulmonary provider at [facility] and no longer needs to see [place]  ----- Message -----  From: Jonelle Sports, RN  Sent: 02/26/2020  10:15 AM EST  To: Lars Pinks, RN, Jonelle Sports, RN, #  Subject: appointment                                      Hello,  Will one of you reach out to this pt and get her scheduled to see Dr. Wellington Hampshire.  She was a no-show in September. We are getting medication refill requests and she needs to be seen.    Thanks,  Huntley Dec

## 2020-03-01 MED ORDER — HYDROCHLOROTHIAZIDE 25 MG TABLET
ORAL_TABLET | 0 refills | 0 days | Status: CP
Start: 2020-03-01 — End: ?

## 2020-03-01 NOTE — Unmapped (Signed)
Patient is requesting the following refill  Requested Prescriptions     Pending Prescriptions Disp Refills   ??? hydroCHLOROthiazide (HYDRODIURIL) 25 MG tablet [Pharmacy Med Name: hydroCHLOROthiazide 25 MG Oral Tablet] 90 tablet 0     Sig: Take 1 tablet by mouth once daily       Last OV: 01/19/2020 Visit date not found     Next OV: 03/23/2020.

## 2020-03-02 NOTE — Unmapped (Deleted)
Podiatry Clinic Note:    Patient did not show for appointment.    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 Patient's condition was discussed with the patient in detail.  Patient was given opportunity to ask questions.     Surgical site remains healed well. No signs of infection noted. No edema. No erythema. No dehiscence. Skin is well co-apted.     Dressings: ***Betadine, gauze, and tape applied.     Activity: WBAT in diabetic shoes with custom functional orthotics.     Patient to check feet daily for ulcers.     Continue application of ketoconazole 2% BID to affected nails and areas of feet.     RTC in ***    All questions were answered and no guarantees were given or implied. Patient expressed understanding.       History of Present Illness: Patient is a 53 y.o. 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       Patient denies any other complaints. Patient denies nausea, vomiting, fever, chills, SOB and chest pain.     A1c was 9.9% on 11/11/19.    Past Medical History:   Diagnosis Date   ??? 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 Breast injury     lung surg on left incision under breast sept 2016   ??? Caregiver burden     parents    ??? Hepatitis C antibody test positive 2016    repeatedly negative HCV RNA, indicating clearance of infection w/o treatment   ??? Hyperlipidemia    ??? Hypertension    ??? 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 On home O2 2008    per Dr Mal Amabile note from 02/21/2017   ??? Pulmonary aspergillosis (CMS-HCC) 2016    A.fumigatus in 2016, prior to LUL lobectomy. A.niger from sputum 08/2016-09/2016.   ??? 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 Sarcoidosis 1995   ??? Type 2 diabetes mellitus with hyperglycemia (CMS-HCC) 07/27/2014   ??? Visual impairment     glasses       Past Surgical History:   Procedure Laterality Date   ??? LUNG LOBECTOMY     ??? PR AMPUTATION TOE,MT-P JT Right 12/12/2019    Procedure: Right foot fifth toe amputation at the metatarsophalangeal joint level;  Surgeon: Britt Bottom, DPM;  Location: MAIN OR [facility];  Service: Vascular   ??? PR RIGHT HEART CATH O2 SATURATION & CARDIAC OUTPUT N/A 04/29/2019    Procedure: Right Heart Catheterization;  Surgeon: Rosana Hoes, MD;  Location: [facility] CATH;  Service: Cardiology   ??? PR THORACOSCOPY SURG TOT PULM DECORT Left 12/14/2014    Procedure: THORACOSCOPY SURG; W/TOT PULM DECORTIC/PNEUMOLYS;  Surgeon: Evert Kohl, MD;  Location: MAIN OR [facility];  Service: Cardiothoracic   ??? PR THORACOSCOPY W/THERA  WEDGE RESEXN INITIAL UNILAT Left 12/03/2014    Procedure: THORACOSCOPY, SURGICAL; WITH THERAPEUTIC WEDGE RESECTION (EG, MASS, NODULE) INITIAL UNILATERAL;  Surgeon: Evert Kohl, MD;  Location: MAIN OR [facility];  Service: Cardiothoracic             No Known Allergies     ROS:   Constitutional:  Denies fever or chills   Eyes:  Denies change in visual acuity   HENT:  Denies nasal congestion or sore throat   Respiratory:  Denies cough or shortness of breath   Cardiovascular:  Denies chest pain or edema   GI:  Denies abdominal pain, nausea, vomiting, bloody stools or diarrhea   GU:  Denies dysuria   Musculoskeletal:  Denies back pain or joint pain   Integument:  Denies rash   Neurologic:  Denies headache, focal weakness or sensory changes Endocrine:  Denies polyuria or polydipsia   Lymphatic:  Denies swollen glands   Psychiatric:  Denies depression or anxiety       Test Results  Imaging: Pending      Physical:   General Appearance: well nourished, Aox3    Vascular: Stable and unchanged.    Skin: Surgical site remains healed well. No signs of infection noted. No edema. No erythema. No dehiscence. Skin is well co-apted.     Dry, flaky skin noted to plantar aspect of bilateral feet.   Nails of are elongated, discolored, distrophic with sunungual debris x 10.    Neurological: Protective sensation is diminished bilaterally. Sharp/dull sensation is diminished bilaterally.     MSK: ***Muscle strength exam deferred due to post-op.    ***pic?    I personally spent 20 minutes face-to-face and non-face-to-face in the care of this patient, which includes all pre, intra, and post visit time on the date of service.

## 2020-03-04 NOTE — Unmapped (Signed)
[facility] Refill Coordination Note    Specialty Medication(s) to be Shipped:   CF/Pulmonary: -Nucala 100mg /ml  Other medication(s) to be shipped: No additional medications requested for fill at this time     Karen Barber, DOB: June 03, 1965  Phone: [phone] (home) [phone] (work)    All above HIPAA information was verified with patient.     Was a Nurse, learning disability used for this call? No    Completed refill call assessment today to schedule patient's medication shipment from the [facility] [phone]).       Specialty medication(s) and dose(s) confirmed: Regimen is correct and unchanged.   Changes to medications: [name] reports no changes at this time.  Changes to insurance: No  Questions for the pharmacist: No    Confirmed patient received Welcome Packet with first shipment. The patient will receive a drug information handout for each medication shipped and additional FDA Medication Guides as required.       DISEASE/MEDICATION-SPECIFIC INFORMATION        For CF patients: CF [facility] Active? No-not enrolled      Next Injection is scheduled for 03/16/20    SPECIALTY MEDICATION ADHERENCE     Medication Adherence    Patient reported X missed doses in the last month: 0  Specialty Medication: Nucala 100mg /ml  Patient is on additional specialty medications: No  Informant: patient  Reliability of informant: reliable        SHIPPING     Shipping address confirmed in Epic.     Delivery Scheduled: Yes, Expected medication delivery date: 03/11/2020.     Medication will be delivered via Same Day Courier to the prescription address in Epic WAM.    Brevyn Ring P Allena Katz   [facility] Specialty Technician

## 2020-03-04 NOTE — Unmapped (Signed)
Pt called to see if she can get an earlier appointment with Dr. Allena Katz prior to her 12/28 appointment. Informed patient there is not any available appointments between now and the 28th. Signs and symptoms of infection reviewed with patient and changes in wound status that would required attention from PCP, urgent care or the ER. Pt verbalized instructions and denied any further questions at this time.

## 2020-03-04 NOTE — Unmapped (Signed)
Let message, is the diabetic shoe form and handicap form ready?    Handicap form done and [facility] aware.  We will need to fill out shoe form at her next visit so it's documented on 03/23/2020.

## 2020-03-11 MED FILL — NUCALA 100 MG/ML SUBCUTANEOUS AUTO-INJECTOR: ORAL | 28 days supply | Qty: 1 | Fill #8

## 2020-03-11 MED FILL — NUCALA 100 MG/ML SUBCUTANEOUS AUTO-INJECTOR: 28 days supply | Qty: 1 | Fill #8 | Status: AC

## 2020-03-15 NOTE — Unmapped (Signed)
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 Patient's condition was discussed with the patient in detail.  Patient was given opportunity to ask questions.     Surgical site at the 5th toe, right foot has healed. No signs of infection noted. No edema. No erythema. No dehiscence. Skin is well co-apted.     New ulcer's noted between 3rd and 4th toes, right foot at PIPJ level, no probe to bone, no malodor and no signs of infection.     Given patient's developing ulcers of the right foot, XR of the right foot ordered in clinic for further work up.     Procedure: excisional debridement of the right foot wounds 3rd toe laterally and 4th toe medially   At this time patient's wound was debrided due to the increase in devitalized tissue of epidermis/dermis, exudate, callus tissue, and other necrotic/fibrotic tissue impairing proper wound healing as reported in the Objective. All benefits, risks, and alternatives were explained in detail with the patient. A time out was taken prior to the procedure. The area was prepped in standard clinic fashion. An excisional Sharp, surgical debridement was performed to fully debride and remove all central devitalized and necrotic tissue including subcutaneous tissue. The debridement occurred to the level of subcutaneous tissue until  bleeding granulation tissue was observed in 100% of the wound bed.  This was achieved with the use of a curette, forceps and scalpel. Blood loss was minimal.  Hemostasis was achieved with pressure and the post-debridement measurements are documented in the chart. Patient tolerated procedure well at this time.    Dressings: Betadine, gauze, and tape applied. Patient should continue to changes dressings daily and to avoid soaking foot.     Activity: WBAT in diabetic shoes with custom functional orthotics.     Patient to check feet daily for ulcers.     Continue application of ketoconazole 2% BID to affected nails and areas of feet.     RTC 3 weeks.     All questions were answered and no guarantees were given or implied. Patient expressed understanding.     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 Today, she reports she started developing new ulcers on the right foot in the last 2-4 weeks while wearing regular shoes. She reports no drainage of the wounds. She is now taking 4-5 L of oxygen per minute and she had a cardiac catheretization procedure done recently. Patient denies any other complaints. Patient denies fever, nausea, vomiting, chills, SOB, chest pain      A1c was 10.1% on 12/12/2019    Past Medical History:   Diagnosis Date   ??? 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  Breast injury     lung surg on left incision under breast sept 2016   ??? Caregiver burden     parents    ??? Hepatitis C antibody test positive 2016    repeatedly negative HCV RNA, indicating clearance of infection w/o treatment   ??? Hyperlipidemia    ??? Hypertension    ??? 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 On home O2 2008    per Dr Mal Amabile note from 02/21/2017   ??? Pulmonary aspergillosis (CMS-HCC) 2016    A.fumigatus in 2016, prior to LUL lobectomy. A.niger from sputum 08/2016-09/2016.   ??? 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 Sarcoidosis 1995   ??? Type 2 diabetes mellitus with hyperglycemia (CMS-HCC) 07/27/2014   ??? Visual impairment     glasses     No Known Allergies     ROS:   Constitutional:  Denies fever or chills   Eyes:  Denies change in visual acuity   HENT:  Denies nasal congestion or sore throat   Respiratory:  Denies cough or shortness of breath   Cardiovascular:  Denies chest pain or edema   GI:  Denies abdominal pain, nausea, vomiting, bloody stools or diarrhea   GU:  Denies dysuria   Musculoskeletal:  Denies back pain or joint pain   Integument:  Denies rash   Neurologic:  Denies headache, focal weakness or sensory changes   Endocrine:  Denies polyuria or polydipsia   Lymphatic:  Denies swollen glands   Psychiatric:  Denies depression or anxiety     Test Results  Imaging: Pending    Physical:   General Appearance: well nourished, Aox3    Vascular: Stable and unchanged.    Skin:   Right foot, 5th ulcer surgical site has healed. No signs of infection noted. No edema. No erythema. No dehiscence. Skin is well co-apted.     Ulcer of the 4th toe, PIPJ level measuring 4.2 x 1 x 0.4 cm. Right 3rd toe ulcer, lateral aspect, measuring 1 x 0.8 x 0.3 cm. Both wounds do not probe to bone, have no signs of infection or malodor.    Dry, flaky skin noted to plantar aspect of bilateral feet.   Nails of are elongated, discolored, distrophic with sunungual debris x 10.    Neurological: Protective sensation is diminished bilaterally. Sharp/dull sensation is diminished bilaterally.     MSK: Muscle strength exam deferred due to post-op.      Right lateral great toe      Lateral right 3rd toe      Medial right 4th toe     I personally spent 20 minutes face-to-face and non-face-to-face in the care of this patient, which includes all pre, intra, and post visit time on the date of service. ______________________________________________________________________    Documentation assistance was provided by Abelino Derrick Scribe, on March 16, 2020 at 9:02 AM for Otho Perl, DPM.    Documentation assistance provided by the Scribe. I was present during the time the encounter was recorded. The information recorded by the Scribe was done at my direction and has been reviewed and validated by me.

## 2020-03-16 ENCOUNTER — Ambulatory Visit: Admit: 2020-03-16 | Discharge: 2020-03-17 | Payer: MEDICARE

## 2020-03-16 DIAGNOSIS — Z7952 Long term (current) use of systemic steroids: Principal | ICD-10-CM

## 2020-03-16 DIAGNOSIS — L97516 Non-pressure chronic ulcer of other part of right foot with bone involvement without evidence of necrosis: Principal | ICD-10-CM

## 2020-03-16 DIAGNOSIS — L97519 Non-pressure chronic ulcer of other part of right foot with unspecified severity: Principal | ICD-10-CM

## 2020-03-16 DIAGNOSIS — B353 Tinea pedis: Principal | ICD-10-CM

## 2020-03-16 DIAGNOSIS — J449 Chronic obstructive pulmonary disease, unspecified: Principal | ICD-10-CM

## 2020-03-16 DIAGNOSIS — J4550 Severe persistent asthma, uncomplicated: Principal | ICD-10-CM

## 2020-03-16 DIAGNOSIS — Z89421 Acquired absence of other right toe(s): Principal | ICD-10-CM

## 2020-03-16 DIAGNOSIS — I10 Essential (primary) hypertension: Principal | ICD-10-CM

## 2020-03-16 DIAGNOSIS — E785 Hyperlipidemia, unspecified: Principal | ICD-10-CM

## 2020-03-16 DIAGNOSIS — E11621 Type 2 diabetes mellitus with foot ulcer: Principal | ICD-10-CM

## 2020-03-16 DIAGNOSIS — D869 Sarcoidosis, unspecified: Principal | ICD-10-CM

## 2020-03-16 DIAGNOSIS — Z9981 Dependence on supplemental oxygen: Principal | ICD-10-CM

## 2020-03-16 DIAGNOSIS — B351 Tinea unguium: Principal | ICD-10-CM

## 2020-03-16 DIAGNOSIS — Z902 Acquired absence of lung [part of]: Principal | ICD-10-CM

## 2020-03-16 DIAGNOSIS — Z8701 Personal history of pneumonia (recurrent): Principal | ICD-10-CM

## 2020-03-16 DIAGNOSIS — L97511 Non-pressure chronic ulcer of other part of right foot limited to breakdown of skin: Principal | ICD-10-CM

## 2020-03-16 NOTE — Unmapped (Addendum)
WOUND CARE INSTRUCTIONS:      - KEEP DRY. Aviod soaks    Wash area with Antibacterial liquid soap with one wash cloth, clean the soap off with a clean cloth, and then pat dry with a dry cloth before the dressing change.    Dressing: Betadine and gauze      If you have any questions or concerns regarding your wound or wound care, please contact us at the [facility]ound [facility]aling and Podiatry clinic at [phone].    During your visit today the Doctor has ordered for you:  Xrays of     You can get the X-rays at any [facility] Radiology Department.   Here is the closest one to this Clinic:    [facility]  Address: [address] [place], [place] [address]

## 2020-03-23 ENCOUNTER — Ambulatory Visit: Admit: 2020-03-23 | Discharge: 2020-03-23 | Payer: MEDICARE

## 2020-03-23 ENCOUNTER — Ambulatory Visit: Admit: 2020-03-23 | Discharge: 2020-03-23 | Payer: MEDICARE | Attending: Family | Primary: Family

## 2020-03-23 DIAGNOSIS — E11621 Type 2 diabetes mellitus with foot ulcer: Principal | ICD-10-CM

## 2020-03-23 DIAGNOSIS — L97511 Non-pressure chronic ulcer of other part of right foot limited to breakdown of skin: Principal | ICD-10-CM

## 2020-03-23 NOTE — Unmapped (Deleted)
Assessment and Plan:     There are no diagnoses linked to this encounter.    HGB A1c *** ({up/down:77630} from 10.1 three months ago).   DM {controlled/uncontrolled:77631}. Continue Lantus 24 units daily, Novolog with sliding scale dosage, and metformin 1000 mg BID.  Encouraged patient to continue carb controlled diet and regular exercise.     I personally spent *** minutes face-to-face and non-face-to-face in the care of this patient, which includes all pre, intra, and post visit time on the date of service.  Over 50% spent in face to face counseling and education regarding ***.    No follow-ups on file.    HPI:      Karen Barber  is here for No chief complaint on file.    Diabetes: Patient presents for follow up of diabetes.  A1C goal is <8.  Diabetes has customarily {been/not been:38678} at goal (complicated by: ***).  Current symptoms include: {Symptoms; diabetes:14075::none}. Symptoms have {symptom progression:19445}. Patient denies {Symptoms; diabetes:14075}. Evaluation to date has included: {dm [phone]}.  Home sugars: {dm home sugars:14018}. Current treatment: Continued metformin and insulin  which has been {effective/ineffective:14021}.  Doing regular exercise: {yes no:22180}.     {kerichronicdx:74702}    {keriacutedx:74703}     ROS:      Comprehensive 10 point ROS negative unless otherwise stated in the HPI.      [facility] Components:     Medication adherence and barriers to the treatment plan have been addressed. Opportunities to optimize healthy behaviors have been discussed. Patient / caregiver voiced understanding.    Past Medical/Surgical History:     Past Medical History:   Diagnosis Date   ??? 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 Breast injury     lung surg on left incision under breast sept 2016   ??? Caregiver burden     parents    ??? Hepatitis C antibody test positive 2016    repeatedly negative HCV RNA, indicating clearance of infection w/o treatment   ??? Hyperlipidemia    ??? Hypertension    ??? 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 On home O2 2008    per Dr Mal Amabile note from 02/21/2017   ??? Pulmonary aspergillosis (CMS-HCC) 2016    A.fumigatus in 2016, prior to LUL lobectomy. A.niger from sputum 08/2016-09/2016.   ??? 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 Sarcoidosis 1995   ??? Type 2 diabetes mellitus with hyperglycemia (CMS-HCC) 07/27/2014   ??? Visual impairment     glasses     Past Surgical History:   Procedure Laterality Date   ??? LUNG LOBECTOMY     ??? PR AMPUTATION TOE,MT-P JT Right 12/12/2019    Procedure: Right foot fifth toe amputation at the metatarsophalangeal joint level;  Surgeon: Britt Bottom, DPM;  Location: MAIN OR [facility];  Service: Vascular   ??? PR RIGHT HEART CATH O2 SATURATION & CARDIAC OUTPUT N/A 04/29/2019    Procedure: Right Heart Catheterization;  Surgeon: Rosana Hoes, MD;  Location: [facility] CATH;  Service: Cardiology   ??? PR THORACOSCOPY SURG TOT PULM DECORT Left 12/14/2014    Procedure: THORACOSCOPY SURG; W/TOT PULM DECORTIC/PNEUMOLYS;  Surgeon: Evert Kohl, MD;  Location: MAIN OR [facility];  Service: Cardiothoracic   ??? PR THORACOSCOPY W/THERA WEDGE RESEXN INITIAL UNILAT Left 12/03/2014    Procedure: THORACOSCOPY, SURGICAL; WITH THERAPEUTIC WEDGE RESECTION (EG, MASS, NODULE)  INITIAL UNILATERAL;  Surgeon: Evert Kohl, MD;  Location: MAIN OR [facility];  Service: Cardiothoracic       Family History:     Family History   Problem Relation Age of Onset   ??? Diabetes Father    ??? Diabetes Brother    ??? Diabetes Maternal Aunt    ??? Sarcoidosis Maternal Aunt    ??? Diabetes Maternal Uncle    ??? Diabetes Paternal Aunt    ??? Diabetes Paternal Uncle        Social History:     Social History     Tobacco Use   ??? Smoking status: Former Smoker     Packs/day: 0.50     Years: 9.00     Pack years: 4.50     Types: Cigarettes     Quit date: 05/03/1987     Years since quitting: 32.9   ??? Smokeless tobacco: Never Used   Vaping Use   ??? Vaping Use: Never used   Substance Use Topics   ??? Alcohol use: No     Alcohol/week: 0.0 standard drinks   ??? Drug use: No       Allergies:     Patient has no known allergies.    Current Medications:     Current Outpatient Medications   Medication Sig Dispense Refill   ??? ACCU-CHEK FASTCLIX LANCET DRUM Misc USE TO CHECK GLUCOSE 4 TIMES DAILY BEFORE MEAL(S) AND NIGHTLY     ??? ACCU-CHEK GUIDE GLUCOSE METER Misc 1 Device by Other route Four (4) times a day (before meals and nightly). AS DIRECTED 1 kit 0   ??? acetaminophen (TYLENOL) 325 MG tablet Take 2 tablets (650 mg total) by mouth every four (4) hours as needed. 30 tablet 1   ??? albuterol 2.5 mg /3 mL (0.083 %) nebulizer solution Inhale 3 mL (2.5 mg total) by nebulization every six (6) hours as needed for wheezing or shortness of breath (airway clearance/cough). 120 mL 0   ??? albuterol HFA 90 mcg/actuation inhaler Inhale 2 puffs every four (4) hours as needed for wheezing or shortness of breath. 18 g 6   ??? amLODIPine (NORVASC) 5 MG tablet Take 5 mg by mouth daily. Added per patient     ??? blood sugar diagnostic Strp by Other route Three (3) times a day. ACCU-CHEK Guide meter 200 strip 2   ??? budesonide-formoteroL (SYMBICORT) 160-4.5 mcg/actuation inhaler Inhale 2 puffs Two (2) times a day. 10.2 g 3   ??? cyclobenzaprine (FLEXERIL) 10 MG tablet Take 1 tablet (10 mg total) by mouth Three (3) times a day as needed. 90 tablet 0   ??? empty container Misc USE AS DIRECTED 1 each 2   ??? gabapentin (NEURONTIN) 300 MG capsule Take 1 capsule (300 mg total) by mouth Three (3) times a day. 270 capsule 1   ??? hydroCHLOROthiazide (HYDRODIURIL) 25 MG tablet Take 1 tablet by mouth once daily 90 tablet 0   ??? insulin ASPART (NOVOLOG U-100 INSULIN ASPART) 100 unit/mL injection Take 1-20 units SQ QID AC and HS as directed 80 mL 1   ??? insulin glargine (BASAGLAR, LANTUS) 100 unit/mL (3 mL) injection pen Inject 0.18 mL (18 Units total) under the skin daily. 18 units (Patient taking differently: Inject 24 Units under the skin daily. 18 units) 18 mL 1   ??? ipratropium (ATROVENT) 0.02 % nebulizer solution Inhale 2.5 mL (500 mcg total) by nebulization Four (4) times a day. (Patient taking differently: Inhale 500 mcg by nebulization Two (2)  times a day. ) 900 mL 1   ??? ketoconazole (NIZORAL) 2 % cream Apply 1 application topically Two (2) times a day. 60 g 5   ??? MEDICAL SUPPLY ITEM Accucheck Glide Glocumeter for home glucose checks. Check blood glucose ACHS. 1 Package 0   ??? mepolizumab (NUCALA) 100 mg/mL AtIn Inject the contents of 1 syringe (100 mg) under the skin every twenty-eight (28) days. 1 mL 12   ??? metFORMIN (GLUCOPHAGE) 1000 MG tablet Take 1 tablet (1,000 mg total) by mouth 2 (two) times a day with meals. 180 tablet 1   ??? miscellaneous medical supply Misc Nebulizer machine and tubing. Patients machine is 57 years old. 1 each 0   ??? OXYGEN-AIR DELIVERY SYSTEMS MISC Dose: 2-3 LPM     ??? pen needle, diabetic (BD ULTRA-FINE NANO PEN NEEDLE) 32 gauge x 5/32 (4 mm) Ndle Inject 100 each under the skin daily. Needles for insulin pen. Inject daily as instructed. 250.00 100 each 3   ??? posaconazole (NOXAFIL) 100 mg TbEC delayed released tablet Take 200 mg by mouth daily. 60 tablet 2   ??? pravastatin (PRAVACHOL) 40 MG tablet Take 1 tablet (40 mg total) by mouth daily with evening meal. 90 tablet 1   ??? sildenafiL, pulm.hypertension, (REVATIO) 20 mg tablet Take 1 tablet (20 mg total) by mouth Three (3) times a day. 90 tablet 11     No current facility-administered medications for this visit.       Health Maintenance:     Health Maintenance   Topic Date Due   ??? HPV Cotest with Pap Smear (21-65)  Never done   ??? Pap Smear with Cotest HPV (21-65)  02/27/2001   ??? FIT-DNA Stool Test  Never done   ??? Zoster Vaccines (1 of 2) Never done   ??? DTaP/Tdap/Td Vaccines (2 - Td or Tdap) 07/07/2017   ??? Retinal Eye Exam  11/15/2019   ??? Mammogram Start Age 88  02/29/2020   ??? Hemoglobin A1c  03/12/2020   ??? Foot Exam  01/18/2021   ??? Urine Albumin/Creatinine Ratio  01/18/2021   ??? Serum Creatinine Monitoring  02/17/2021   ??? Potassium Monitoring  02/17/2021   ??? COPD Spirometry  01/04/2022   ??? Hepatitis C Screen  Completed   ??? COVID-19 Vaccine  Completed   ??? Influenza Vaccine  Completed   ??? Pneumococcal Vaccine  Completed       Immunizations:     Immunization History   Administered Date(s) Administered   ??? COVID-19 VACC,MRNA,(PFIZER)(PF)(IM) 06/11/2019, 07/02/2019, 11/21/2019   ??? INFLUENZA TIV (TRI) PF (IM) 01/02/2005, 12/14/2008   ??? Influenza Vaccine Quad (IIV4 PF) 53mo+ injectable 11/28/2012, 12/19/2014, 12/16/2015, 01/03/2017, 12/06/2017, 01/08/2019, 12/15/2019   ??? PNEUMOCOCCAL POLYSACCHARIDE 23 07/08/2007, 01/03/2017   ??? PPD Test 01/19/2015   ??? TdaP 07/08/2007     I have reviewed and (if needed) updated the patient's problem list, medications, allergies, past medical and surgical history, social and family history.     Vital Signs:     Wt Readings from Last 3 Encounters:   01/19/20 97.1 kg (214 lb)   01/13/20 (!) 107 kg (236 lb)   12/19/19 (!) 107 kg (236 lb)     Temp Readings from Last 3 Encounters:   03/16/20 36.4 ??C (97.5 ??F) (Temporal)   01/19/20 36.6 ??C (97.8 ??F) (Oral)   01/13/20 36.7 ??C (98.1 ??F) (Temporal)     BP Readings from Last 3 Encounters:   03/16/20 126/85   01/19/20 120/76  01/13/20 149/89     Pulse Readings from Last 3 Encounters:   03/16/20 90   01/19/20 82   01/13/20 101     Estimated body mass index is 29.85 kg/m?? as calculated from the following:    Height as of 01/19/20: 180.3 cm (5' 11).    Weight as of 01/19/20: 97.1 kg (214 lb).  No height and weight on file for this encounter.      Objective:      General: Alert and oriented x3. Well-appearing. No acute distress. ***  HEENT:  Normocephalic.  Atraumatic. Conjunctiva and sclera normal. OP MMM without lesions. ***  Neck:  Supple. No thyroid enlargement. No adenopathy. ***  Heart:  Regular rate and rhythm . Normal S1, S2.  No murmurs, rubs or gallops. ***  Lungs:  No respiratory distress.  Lungs clear to auscultation. No wheezes, rhonchi, or rales. ***  GI/GU:  Soft, +BS, nondistended, non-TTP. No palpable masses or organomegaly. ***  Extremities:  No edema. Peripheral pulses normal. ***  Skin:  Warm, dry. No rash or lesions present. ***  Neuro:  Non-focal. No obvious weakness. ***  Psych:  Affect normal, eye contact good, speech clear and coherent. ***     I attest that I, Bayard Hugger, personally documented this note while acting as scribe for Noralyn Pick, FNP.      Bayard Hugger, Scribe.  03/23/2020     The documentation recorded by the scribe accurately reflects the service I personally performed and the decisions made by me.    Noralyn Pick, FNP

## 2020-03-24 MED ORDER — FUROSEMIDE 20 MG TABLET
Freq: Every day | ORAL | 0.00000 days | PRN
Start: 2020-03-24 — End: ?

## 2020-03-30 ENCOUNTER — Other Ambulatory Visit: Payer: Self-pay

## 2020-03-30 ENCOUNTER — Encounter: Payer: Medicare Other | Attending: Critical Care Medicine

## 2020-03-30 DIAGNOSIS — D860 Sarcoidosis of lung: Secondary | ICD-10-CM | POA: Insufficient documentation

## 2020-03-30 DIAGNOSIS — J9611 Chronic respiratory failure with hypoxia: Secondary | ICD-10-CM | POA: Insufficient documentation

## 2020-03-30 DIAGNOSIS — D869 Sarcoidosis, unspecified: Secondary | ICD-10-CM

## 2020-03-30 NOTE — Unmapped (Signed)
[facility] Refill Coordination Note    Specialty Medication(s) to be Shipped:   CF/Pulmonary: -Nucala 100mg /ml  Other medication(s) to be shipped: No additional medications requested for fill at this time     Karen Barber, DOB: 1965/08/04  Phone: [phone] (home) [phone] (work)    All above HIPAA information was verified with patient.     Was a Nurse, learning disability used for this call? No    Completed refill call assessment today to schedule patient's medication shipment from the [facility] [phone]).       Specialty medication(s) and dose(s) confirmed: Regimen is correct and unchanged.   Changes to medications: [name] reports no changes at this time.  Changes to insurance: No  Questions for the pharmacist: No    Confirmed patient received Welcome Packet with first shipment. The patient will receive a drug information handout for each medication shipped and additional FDA Medication Guides as required.       DISEASE/MEDICATION-SPECIFIC INFORMATION        For CF patients: CF [facility] Active? No-not enrolled    SPECIALTY MEDICATION ADHERENCE     Medication Adherence    Patient reported X missed doses in the last month: 0  Specialty Medication: Nucala 100mg /ml  Patient is on additional specialty medications: No  Informant: patient  Reliability of informant: reliable  Reasons for non-adherence: no problems identified        Nucala 100mg /ml: 0 days of medicine on hand (Next Injection: 04/16/20)    SHIPPING     Shipping address confirmed in Epic.     Delivery Scheduled: Yes, Expected medication delivery date: 04/08/2020.     Medication will be delivered via Same Day Courier to the prescription address in Epic WAM.    Karen Barber   [facility] Specialty Technician

## 2020-03-30 NOTE — Progress Notes (Signed)
Virtual Visit completed. Patient informed on EP and RD appointment and 6 Minute walk test. Patient also informed of patient health questionnaires on My Chart. Patient Verbalizes understanding. Visit diagnosis can be found in CHL10/29/2021.

## 2020-04-05 NOTE — Unmapped (Signed)
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 Patient's condition was discussed with the patient in detail.  Patient was given opportunity to ask questions.     Ulcers noted between 3rd and 4th toes, right foot at PIPJ level, probes almost to capsule, granulofibrotic wound base noted, peri wound hyperkeratotic tissue noted, no purulent drainage noted, no localized signs of infection noted.     Right foot XR from 03/23/20 reviewed in detail with patient showing no radiographic findings of osteomyelitis.     Procedure: excisional debridement of the right foot wounds, 3rd toe lateral and 4th toe medial  At this time patient's wound was debrided due to the increase in devitalized tissue of epidermis/dermis, exudate, callus tissue, and other necrotic/fibrotic tissue impairing proper wound healing as reported in the Objective. All benefits, risks, and alternatives were explained in detail with the patient. A time out was taken prior to the procedure. The area was prepped in standard clinic fashion. An excisional Sharp, surgical debridement was performed to fully debride and remove all central devitalized and necrotic tissue including subcutaneous tissue. The debridement occurred to the level of subcutaneous tissue until  bleeding granulation tissue was observed in 100% of the wound bed.  This was achieved with the use of a curette, forceps and scalpel. Blood loss was minimal.  Hemostasis was achieved with pressure and the post-debridement measurements are documented in the chart. Patient tolerated procedure well at this time.    Dressings: Betadine, gauze, and tape applied. Patient should continue to change dressings daily and to avoid soaking foot.     Discussed with patient beginning to wash web spaces daily with antibacterial soap, making sure to dry thoroughly before applying dressings and separating her 3rd and 4th toes. She should avoid applying ketoconazole to her wounds, as this medication macerates the area.     Activity: WBAT in [facility] shoe to right foot. Patient's footwear is adequate for pulmonary rehabilitation.    Patient to check feet daily for ulcers.     Continue application of ketoconazole 2% BID to affected nails and areas of feet.     RTC in 3 weeks.    All questions were answered and no guarantees were given or implied. Patient expressed understanding.     History of Present Illness: Patient is a 55 y/o female with a history of T2DM, HTN, COPD, hypoxia, sarcoidosis, severe persistent asthma dependent on systemic steroids who returns to the clinic regarding right foot ulcers between 3rd and 4th toes at PIPJ level. She endorses separating her 3rd and 4th toes with idodine-soaked gauze. She also endorses washing the area daily and making sure to dry her webspace out. Patient has not been soaking her feet. [name] has been ambulating in a [facility] offloading shoe with a fuzzy soak. She notes that her thick socks sometimes cause compression to her right foot. Of note, patient begins pulmonary rehabilitation tomorrow and wonders if her footwear is okay. Patient denies any other complaints. Patient denies fever, nausea, vomiting, chills,  SOB, chest pain      A1c was 10.1% on 12/12/2019    Past Medical History:   Diagnosis Date   ??? 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 Breast injury     lung surg on left incision under breast sept 2016   ??? Caregiver burden     parents    ??? Hepatitis C antibody test positive 2016    repeatedly negative HCV RNA, indicating clearance of infection w/o treatment   ??? Hyperlipidemia    ??? Hypertension    ??? 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 On home O2 2008    per Dr Mal Amabile note from 02/21/2017   ??? Pulmonary aspergillosis (CMS-HCC) 2016    A.fumigatus in 2016, prior to LUL lobectomy. A.niger from sputum 08/2016-09/2016.   ??? 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 Sarcoidosis 1995   ??? Type 2 diabetes mellitus with hyperglycemia (CMS-HCC) 07/27/2014   ??? Visual impairment     glasses     No Known Allergies     ROS:   Constitutional:  Denies fever or chills   Eyes:  Denies change in visual acuity   HENT:  Denies nasal congestion or sore throat   Respiratory:  Denies cough or shortness of breath   Cardiovascular:  Denies chest pain or edema   GI:  Denies abdominal pain, nausea, vomiting, bloody stools or diarrhea   GU:  Denies dysuria   Musculoskeletal:  Denies back pain or joint pain   Integument:  Denies rash   Neurologic:  Denies headache, focal weakness or sensory changes   Endocrine:  Denies polyuria or polydipsia   Lymphatic:  Denies swollen glands   Psychiatric:  Denies depression or anxiety     Test Results  Imaging: Pending    Physical:   General Appearance: well nourished, Aox3    Vascular: Stable and unchanged.    Skin: Ulcers noted between 3rd and 4th toes, right foot at PIPJ level, probes almost to capsule, granulofibrotic wound base noted, peri wound hyperkeratotic tissue noted, no purulent drainage noted, no localized signs of infection noted. Medial 4th toe wound measures 1.5 x 0.5 x 0.4 cm. Lateral 3rd toe wound measures 1 x 1 x 0.4 cm.     Dry, flaky skin noted to plantar aspect of bilateral feet.   Nails of are elongated, discolored, distrophic with sunungual debris x 10.    Neurological: Protective sensation is diminished bilaterally. Sharp/dull sensation is diminished bilaterally.     MSK: Muscle strength exam deferred due to post-op.    Right 3rd and 4th toe:           I personally spent 20 minutes face-to-face and non-face-to-face in the care of this patient, which includes all pre, intra, and post visit time on the date of service.      Scribe's Attestation: Otho Perl, DPM obtained and performed the history, physical exam and medical decision making elements that were  entered into the chart. Documentation assistance was provided by me personally, a scribe. Signed by Azalee Course, Scribe, on April 06, 2020 at 7:57 AM.     Documentation assistance provided by the Scribe. I was present during the time the encounter was recorded. The information recorded by the Scribe was done at my direction and has been reviewed and validated by me.

## 2020-04-06 ENCOUNTER — Ambulatory Visit: Admit: 2020-04-06 | Discharge: 2020-04-07 | Payer: MEDICARE

## 2020-04-06 DIAGNOSIS — E11621 Type 2 diabetes mellitus with foot ulcer: Principal | ICD-10-CM

## 2020-04-06 DIAGNOSIS — L97516 Non-pressure chronic ulcer of other part of right foot with bone involvement without evidence of necrosis: Principal | ICD-10-CM

## 2020-04-06 DIAGNOSIS — L97519 Non-pressure chronic ulcer of other part of right foot with unspecified severity: Principal | ICD-10-CM

## 2020-04-06 DIAGNOSIS — Z902 Acquired absence of lung [part of]: Principal | ICD-10-CM

## 2020-04-06 DIAGNOSIS — Z8701 Personal history of pneumonia (recurrent): Principal | ICD-10-CM

## 2020-04-06 DIAGNOSIS — Z89421 Acquired absence of other right toe(s): Principal | ICD-10-CM

## 2020-04-06 DIAGNOSIS — E785 Hyperlipidemia, unspecified: Principal | ICD-10-CM

## 2020-04-06 DIAGNOSIS — Z8619 Personal history of other infectious and parasitic diseases: Principal | ICD-10-CM

## 2020-04-06 DIAGNOSIS — E119 Type 2 diabetes mellitus without complications: Principal | ICD-10-CM

## 2020-04-06 DIAGNOSIS — J449 Chronic obstructive pulmonary disease, unspecified: Principal | ICD-10-CM

## 2020-04-06 DIAGNOSIS — B353 Tinea pedis: Principal | ICD-10-CM

## 2020-04-06 DIAGNOSIS — B351 Tinea unguium: Principal | ICD-10-CM

## 2020-04-06 DIAGNOSIS — M869 Osteomyelitis, unspecified: Principal | ICD-10-CM

## 2020-04-06 DIAGNOSIS — Z9981 Dependence on supplemental oxygen: Principal | ICD-10-CM

## 2020-04-06 DIAGNOSIS — R0902 Hypoxemia: Principal | ICD-10-CM

## 2020-04-06 DIAGNOSIS — I10 Essential (primary) hypertension: Principal | ICD-10-CM

## 2020-04-06 DIAGNOSIS — D869 Sarcoidosis, unspecified: Principal | ICD-10-CM

## 2020-04-06 NOTE — Unmapped (Signed)
WOUND CARE INSTRUCTIONS:      - KEEP DRY. Aviod soaks  ??  Wash area with Antibacterial liquid soap with one wash cloth, clean the soap off with a clean cloth, and then pat dry with a dry cloth before the dressing change.  ??  Dressing: Betadine and gauze  ??  ??  If you have any questions or concerns regarding your wound or wound care, please contact us at the [facility] Wound Healing and Podiatry clinic at [phone]

## 2020-04-07 ENCOUNTER — Ambulatory Visit: Payer: Medicare Other

## 2020-04-08 MED FILL — NUCALA 100 MG/ML SUBCUTANEOUS AUTO-INJECTOR: ORAL | 28 days supply | Qty: 1 | Fill #9

## 2020-04-13 ENCOUNTER — Other Ambulatory Visit: Payer: Self-pay

## 2020-04-13 ENCOUNTER — Encounter: Payer: Medicare Other | Admitting: *Deleted

## 2020-04-13 VITALS — Ht 71.1 in | Wt 232.3 lb

## 2020-04-13 DIAGNOSIS — D860 Sarcoidosis of lung: Secondary | ICD-10-CM | POA: Diagnosis present

## 2020-04-13 DIAGNOSIS — J9611 Chronic respiratory failure with hypoxia: Secondary | ICD-10-CM | POA: Diagnosis not present

## 2020-04-13 DIAGNOSIS — D869 Sarcoidosis, unspecified: Secondary | ICD-10-CM

## 2020-04-13 NOTE — Patient Instructions (Signed)
Patient Instructions  Patient Details  Name: Charlotte Cross MRN: [id number] Date of Birth: [birth year] Referring Provider:  Vonita Moss, MD  Below are your personal goals for exercise, nutrition, and risk factors. Our goal is to help you stay on track towards obtaining and maintaining these goals. We will be discussing your progress on these goals with you throughout the program.  Initial Exercise Prescription:  Initial Exercise Prescription - 04/13/20 1500      Date of Initial Exercise RX and Referring Provider   Date 04/13/20    Referring Provider Vonita Moss MD      Oxygen   Oxygen Continuous    Liters 6      Recumbant Bike   Level 1    RPM 50    Watts 10    Minutes 15    METs 2      NuStep   Level 1    SPM 80    Minutes 15    METs 2      Arm Ergometer   Level 1    RPM 30    Minutes 15    METs 2      Biostep-RELP   Level 1    SPM 50    Minutes 15    METs 2      Prescription Details   Frequency (times per week) 3    Duration Progress to 30 minutes of continuous aerobic without signs/symptoms of physical distress      Intensity   THRR 40-80% of Max Heartrate 122-150    Ratings of Perceived Exertion 11-13    Perceived Dyspnea 0-4      Progression   Progression Continue to progress workloads to maintain intensity without signs/symptoms of physical distress.      Resistance Training   Training Prescription Yes    Weight 3 lb    Reps 10-15           Exercise Goals: Frequency: Be able to perform aerobic exercise two to three times per week in program working toward 2-5 days per week of home exercise.  Intensity: Work with a perceived exertion of 11 (fairly light) - 15 (hard) while following your exercise prescription.  We will make changes to your prescription with you as you progress through the program.   Duration: Be able to do 30 to 45 minutes of continuous aerobic exercise in addition to a 5 minute warm-up and a 5 minute cool-down routine.    Nutrition Goals: Your personal nutrition goals will be established when you do your nutrition analysis with the dietician.  The following are general nutrition guidelines to follow: Cholesterol < 259m/day Sodium < 1507mday Fiber: Women over 50 yrs - 21 grams per day  Personal Goals:  Personal Goals and Risk Factors at Admission - 04/13/20 1554      Core Components/Risk Factors/Patient Goals on Admission    Weight Management Yes;Weight Loss;Obesity    Intervention Weight Management: Develop a combined nutrition and exercise program designed to reach desired caloric intake, while maintaining appropriate intake of nutrient and fiber, sodium and fats, and appropriate energy expenditure required for the weight goal.;Weight Management: Provide education and appropriate resources to help participant work on and attain dietary goals.;Weight Management/Obesity: Establish reasonable short term and long term weight goals.;Obesity: Provide education and appropriate resources to help participant work on and attain dietary goals.    Admit Weight 232 lb 4.8 oz (105.4 kg)    Goal Weight: Short Term 225 lb (102.1 kg)  Goal Weight: Long Term 220 lb (99.8 kg)    Expected Outcomes Short Term: Continue to assess and modify interventions until short term weight is achieved;Long Term: Adherence to nutrition and physical activity/exercise program aimed toward attainment of established weight goal;Weight Loss: Understanding of general recommendations for a balanced deficit meal plan, which promotes 1-2 lb weight loss per week and includes a negative energy balance of [phone] kcal/d;Understanding recommendations for meals to include 15-35% energy as protein, 25-35% energy from fat, 35-60% energy from carbohydrates, less than 263m of dietary cholesterol, 20-35 gm of total fiber daily;Understanding of distribution of calorie intake throughout the day with the consumption of 4-5 meals/snacks    Improve shortness of  breath with ADL's Yes    Intervention Provide education, individualized exercise plan and daily activity instruction to help decrease symptoms of SOB with activities of daily living.    Expected Outcomes Short Term: Improve cardiorespiratory fitness to achieve a reduction of symptoms when performing ADLs;Long Term: Be able to perform more ADLs without symptoms or delay the onset of symptoms    Diabetes Yes    Intervention Provide education about signs/symptoms and action to take for hypo/hyperglycemia.;Provide education about proper nutrition, including hydration, and aerobic/resistive exercise prescription along with prescribed medications to achieve blood glucose in normal ranges: Fasting glucose 65-99 mg/dL    Expected Outcomes Short Term: Participant verbalizes understanding of the signs/symptoms and immediate care of hyper/hypoglycemia, proper foot care and importance of medication, aerobic/resistive exercise and nutrition plan for blood glucose control.;Long Term: Attainment of HbA1C < 7%.    Hypertension Yes    Intervention Provide education on lifestyle modifcations including regular physical activity/exercise, weight management, moderate sodium restriction and increased consumption of fresh fruit, vegetables, and low fat dairy, alcohol moderation, and smoking cessation.;Monitor prescription use compliance.    Expected Outcomes Long Term: Maintenance of blood pressure at goal levels.;Short Term: Continued assessment and intervention until BP is < 140/931mHG in hypertensive participants. < 130/802mG in hypertensive participants with diabetes, heart failure or chronic kidney disease.    Lipids Yes    Intervention Provide education and support for participant on nutrition & aerobic/resistive exercise along with prescribed medications to achieve LDL <17m10mDL >40mg26m Expected Outcomes Short Term: Participant states understanding of desired cholesterol values and is compliant with medications  prescribed. Participant is following exercise prescription and nutrition guidelines.;Long Term: Cholesterol controlled with medications as prescribed, with individualized exercise RX and with personalized nutrition plan. Value goals: LDL < 17mg,37m > 40 mg.           Tobacco Use Initial Evaluation: Social History   Tobacco Use  Smoking Status Former Smoker  . Packs/day: 0.25  . Years: 6.00  . Pack years: 1.50  . Types: Cigarettes  . Quit date: 03/21/1987  . Years since quitting: 33.0  Smokeless Tobacco Never Used    Exercise Goals and Review:  Exercise Goals    Row Name 04/13/20 1552             Exercise Goals   Increase Physical Activity Yes       Intervention Provide advice, education, support and counseling about physical activity/exercise needs.;Develop an individualized exercise prescription for aerobic and resistive training based on initial evaluation findings, risk stratification, comorbidities and participant's personal goals.       Expected Outcomes Short Term: Attend rehab on a regular basis to increase amount of physical activity.;Long Term: Add in home exercise to make exercise part of routine  and to increase amount of physical activity.;Long Term: Exercising regularly at least 3-5 days a week.       Increase Strength and Stamina Yes       Intervention Provide advice, education, support and counseling about physical activity/exercise needs.;Develop an individualized exercise prescription for aerobic and resistive training based on initial evaluation findings, risk stratification, comorbidities and participant's personal goals.       Expected Outcomes Short Term: Increase workloads from initial exercise prescription for resistance, speed, and METs.;Short Term: Perform resistance training exercises routinely during rehab and add in resistance training at home;Long Term: Improve cardiorespiratory fitness, muscular endurance and strength as measured by increased METs and  functional capacity (6MWT)       Able to understand and use rate of perceived exertion (RPE) scale Yes       Intervention Provide education and explanation on how to use RPE scale       Expected Outcomes Short Term: Able to use RPE daily in rehab to express subjective intensity level;Long Term:  Able to use RPE to guide intensity level when exercising independently       Able to understand and use Dyspnea scale Yes       Intervention Provide education and explanation on how to use Dyspnea scale       Expected Outcomes Short Term: Able to use Dyspnea scale daily in rehab to express subjective sense of shortness of breath during exertion;Long Term: Able to use Dyspnea scale to guide intensity level when exercising independently       Knowledge and understanding of Target Heart Rate Range (THRR) Yes       Intervention Provide education and explanation of THRR including how the numbers were predicted and where they are located for reference       Expected Outcomes Short Term: Able to state/look up THRR;Long Term: Able to use THRR to govern intensity when exercising independently;Short Term: Able to use daily as guideline for intensity in rehab       Able to check pulse independently Yes       Intervention Provide education and demonstration on how to check pulse in carotid and radial arteries.;Review the importance of being able to check your own pulse for safety during independent exercise       Expected Outcomes Short Term: Able to explain why pulse checking is important during independent exercise;Long Term: Able to check pulse independently and accurately       Understanding of Exercise Prescription Yes       Intervention Provide education, explanation, and written materials on patient's individual exercise prescription       Expected Outcomes Short Term: Able to explain program exercise prescription;Long Term: Able to explain home exercise prescription to exercise independently               Copy of goals given to participant.

## 2020-04-13 NOTE — Progress Notes (Signed)
Pulmonary Individual Treatment Plan  Patient Details  Name: Charlotte Cross MRN: [id number] Date of Birth: 01-20-1966 Referring Provider:   Flowsheet Row Pulmonary Rehab from 04/13/2020 in [facility] Cardiac and Pulmonary Rehab  Referring Provider Vonita Moss MD      Initial Encounter Date:  Flowsheet Row Pulmonary Rehab from 04/13/2020 in [facility] Cardiac and Pulmonary Rehab  Date 04/13/20      Visit Diagnosis: Sarcoidosis  Patient's Home Medications on Admission:  Current Outpatient Medications:  .  Accu-Chek FastClix Lancets MISC, USE TO CHECK GLUCOSE 4 TIMES DAILY BEFORE MEAL(S) AND NIGHTLY, Disp: , Rfl:  .  acetaminophen (TYLENOL) 325 MG tablet, Take by mouth., Disp: , Rfl:  .  albuterol (PROVENTIL HFA;VENTOLIN HFA) 108 (90 BASE) MCG/ACT inhaler, Inhale 2 puffs into the lungs every 6 (six) hours as needed for wheezing or shortness of breath., Disp: , Rfl:  .  albuterol (PROVENTIL) (2.5 MG/3ML) 0.083% nebulizer solution, Inhale 2.5 mg into the lungs every 6 (six) hours as needed for wheezing or shortness of breath. , Disp: , Rfl:  .  albuterol (VENTOLIN HFA) 108 (90 Base) MCG/ACT inhaler, Inhale into the lungs. (Patient not taking: Reported on 03/30/2020), Disp: , Rfl:  .  amLODipine (NORVASC) 5 MG tablet, Take by mouth., Disp: , Rfl:  .  aspirin EC 325 MG tablet, Take 325 mg by mouth daily., Disp: , Rfl:  .  Blood Glucose Monitoring Suppl (ACCU-CHEK GUIDE) w/Device KIT, 1 Device by Other route Four (4) times a day (before meals and nightly). AS DIRECTED, Disp: , Rfl:  .  budesonide (PULMICORT) 0.5 MG/2ML nebulizer solution, Inhale 2 mLs (0.5 mg total) into the lungs every 12 (twelve) hours., Disp: 120 mL, Rfl: 0 .  budesonide-formoterol (SYMBICORT) 160-4.5 MCG/ACT inhaler, Inhale into the lungs., Disp: , Rfl:  .  cyclobenzaprine (FLEXERIL) 10 MG tablet, Take 10 mg by mouth 3 (three) times daily as needed for muscle spasms. , Disp: , Rfl:  .  feeding supplement, ENSURE ENLIVE, (ENSURE  ENLIVE) LIQD, Take 237 mLs by mouth daily. (Patient not taking: Reported on 03/30/2020), Disp: 237 mL, Rfl: 12 .  furosemide (LASIX) 20 MG tablet, Take by mouth., Disp: , Rfl:  .  gabapentin (NEURONTIN) 300 MG capsule, Take 300 mg by mouth 3 (three) times daily., Disp: , Rfl:  .  glucose blood (PRECISION QID TEST) test strip, by Other route Three (3) times a day. ACCU-CHEK Guide meter, Disp: , Rfl:  .  hydrochlorothiazide (HYDRODIURIL) 25 MG tablet, Take 25 mg by mouth daily., Disp: , Rfl:  .  insulin aspart (NOVOLOG) 100 UNIT/ML injection, INJECT 1 TO 20 UNIT(S) SUBCUTANEOUSLY 4 TIMES DAILY BEFORE MEALS AND AT BEDTIME AS DIRECTED, Disp: , Rfl:  .  Insulin Glargine (BASAGLAR KWIKPEN) 100 UNIT/ML, 18 Units nightly, Disp: , Rfl:  .  Insulin Pen Needle (BD PEN NEEDLE NANO U/F) 32G X 4 MM MISC, Inject into the skin., Disp: , Rfl:  .  ipratropium (ATROVENT) 0.02 % nebulizer solution, 2 (two) times daily, Disp: , Rfl:  .  ketoconazole (NIZORAL) 2 % cream, Apply topically., Disp: , Rfl:  .  levofloxacin (LEVAQUIN) 750 MG tablet, Take 1 tablet (750 mg total) by mouth daily. (Patient not taking: Reported on 03/30/2020), Disp: 5 tablet, Rfl: 0 .  lisinopril (PRINIVIL,ZESTRIL) 10 MG tablet, Take 10 mg by mouth daily.  (Patient not taking: Reported on 03/30/2020), Disp: , Rfl:  .  Mepolizumab (NUCALA) 100 MG/ML SOAJ, Take by mouth., Disp: , Rfl:  .  metFORMIN (  GLUCOPHAGE) 1000 MG tablet, Take 1,000 mg by mouth 2 (two) times daily with a meal., Disp: , Rfl:  .  posaconazole (NOXAFIL) 100 MG TBEC delayed-release tablet, Take by mouth., Disp: , Rfl:  .  pravastatin (PRAVACHOL) 40 MG tablet, Take 40 mg by mouth at bedtime., Disp: , Rfl:  .  sildenafil (REVATIO) 20 MG tablet, Take by mouth., Disp: , Rfl:   Past Medical History: Past Medical History:  Diagnosis Date  . 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 COPD (chronic obstructive pulmonary disease) ([place])   . Diabetes mellitus without complication ([place])   . Hypertension   .  Sarcoidosis     Tobacco Use: Social History   Tobacco Use  Smoking Status Former Smoker  . Packs/day: 0.25  . Years: 6.00  . Pack years: 1.50  . Types: Cigarettes  . Quit date: 03/21/1987  . Years since quitting: 33.0  Smokeless Tobacco Never Used    Labs: Recent Review Flowsheet Data    Labs for ITP Cardiac and Pulmonary Rehab Latest Ref Rng & Units 12/24/2014 08/26/2016   Hemoglobin A1c 4.8 - 5.6 % - 12.3(H)   HCO3 21.0 - 28.0 mEq/L 29.2(H) -       Pulmonary Assessment Scores:  Pulmonary Assessment Scores    Row Name 04/13/20 1556         ADL UCSD   ADL Phase Entry     SOB Score total 79     Rest 3     Walk 4     Stairs 5     Bath 3     Dress 3     Shop 4           CAT Score   CAT Score 29           mMRC Score   mMRC Score 4            UCSD: Self-administered rating of dyspnea associated with activities of daily living (ADLs) 6-point scale (0 = "not at all" to 5 = "maximal or unable to do because of breathlessness")  Scoring Scores range from 0 to 120.  Minimally important difference is 5 units  CAT: CAT can identify the health impairment of COPD patients and is better correlated with disease progression.  CAT has a scoring range of zero to 40. The CAT score is classified into four groups of low (less than 10), medium (10 - 20), high (21-30) and very high (31-40) based on the impact level of disease on health status. A CAT score over 10 suggests significant symptoms.  A worsening CAT score could be explained by an exacerbation, poor medication adherence, poor inhaler technique, or progression of COPD or comorbid conditions.  CAT MCID is 2 points  mMRC: mMRC (Modified Medical Research Council) Dyspnea Scale is used to assess the degree of baseline functional disability in patients of respiratory disease due to dyspnea. No minimal important difference is established. A decrease in score of 1 point or greater is considered a positive change.   Pulmonary  Function Assessment:  Pulmonary Function Assessment - 04/13/20 1556      Breath   Shortness of Breath Yes;Fear of Shortness of Breath;Limiting activity;Panic with Shortness of Breath           Exercise Target Goals: Exercise Program Goal: Individual exercise prescription set using results from initial 6 min walk test and THRR while considering  patient's activity barriers and safety.   Exercise Prescription Goal: Initial exercise prescription builds to 30-45  minutes a day of aerobic activity, 2-3 days per week.  Home exercise guidelines will be given to patient during program as part of exercise prescription that the participant will acknowledge.  Education: Aerobic Exercise: - Group verbal and visual presentation on the components of exercise prescription. Introduces F.I.T.T principle from ACSM for exercise prescriptions.  Reviews F.I.T.T. principles of aerobic exercise including progression. Written material given at graduation.   Education: Resistance Exercise: - Group verbal and visual presentation on the components of exercise prescription. Introduces F.I.T.T principle from ACSM for exercise prescriptions  Reviews F.I.T.T. principles of resistance exercise including progression. Written material given at graduation.    Education: Exercise & Equipment Safety: - Individual verbal instruction and demonstration of equipment use and safety with use of the equipment. Flowsheet Row Pulmonary Rehab from 04/13/2020 in [facility] Cardiac and Pulmonary Rehab  Date 03/30/20  Educator [facility]  Instruction Review Code 1- Verbalizes Understanding      Education: Exercise Physiology & General Exercise Guidelines: - Group verbal and written instruction with models to review the exercise physiology of the cardiovascular system and associated critical values. Provides general exercise guidelines with specific guidelines to those with heart or lung disease.    Education: Flexibility, Balance, Mind/Body  Relaxation: - Group verbal and visual presentation with interactive activity on the components of exercise prescription. Introduces F.I.T.T principle from ACSM for exercise prescriptions. Reviews F.I.T.T. principles of flexibility and balance exercise training including progression. Also discusses the mind body connection.  Reviews various relaxation techniques to help reduce and manage stress (i.e. Deep breathing, progressive muscle relaxation, and visualization). Balance handout provided to take home. Written material given at graduation.   Activity Barriers & Risk Stratification:  Activity Barriers & Cardiac Risk Stratification - 04/13/20 1550      Activity Barriers & Cardiac Risk Stratification   Activity Barriers Deconditioning;Muscular Weakness;Shortness of Breath;Assistive Device;Other (comment)    Comments uses scooter now due to SOB and fatigue           6 Minute Walk:  6 Minute Walk    Row Name 04/13/20 1545         6 Minute Walk   Phase Initial     Distance 270 feet     Walk Time 2 minutes     # of Rest Breaks 2  2:47, 1:23     MPH 1.53     METS 2.06     RPE 20     Perceived Dyspnea  4     VO2 Peak 7.2     Symptoms Yes (comment)     Comments SOB, fatigue, chest tightness from breathing     Resting HR 94 bpm  initially 77% on low tank     Resting BP 132/78     Resting Oxygen Saturation  99 %     Exercise Oxygen Saturation  during 6 min walk 84 %     Max Ex. HR 125 bpm     Max Ex. BP 152/84     2 Minute Post BP 132/76           Interval HR   1 Minute HR 112     2 Minute HR 123     3 Minute HR 118     4 Minute HR 121     5 Minute HR 122     6 Minute HR 125     2 Minute Post HR 114     Interval Heart Rate? Yes  Interval Oxygen   Interval Oxygen? Yes     Baseline Oxygen Saturation % 99 %     1 Minute Oxygen Saturation % 98 %     1 Minute Liters of Oxygen 6 L  continuous e-tank     2 Minute Oxygen Saturation % 86 %  seated     2 Minute Liters  of Oxygen 6 L     3 Minute Oxygen Saturation % 88 %  seated     3 Minute Liters of Oxygen 6 L     4 Minute Oxygen Saturation % 93 %     4 Minute Liters of Oxygen 6 L     5 Minute Oxygen Saturation % 90 %  seated     5 Minute Liters of Oxygen 6 L     6 Minute Oxygen Saturation % 87 %  low at 84% at 14mn     6 Minute Liters of Oxygen 6 L     2 Minute Post Oxygen Saturation % 91 %     2 Minute Post Liters of Oxygen 6 L           Oxygen Initial Assessment:  Oxygen Initial Assessment - 04/13/20 1555      Home Oxygen   Home Oxygen Device Home Concentrator;Portable Concentrator;E-Tanks    Sleep Oxygen Prescription Continuous    Liters per minute 5    Home Exercise Oxygen Prescription Continuous    Liters per minute 5    Home Resting Oxygen Prescription Continuous    Liters per minute 5    Compliance with Home Oxygen Use Yes      Initial 6 min Walk   Oxygen Used Continuous;E-Tanks    Liters per minute 6      Program Oxygen Prescription   Program Oxygen Prescription Continuous;E-Tanks    Liters per minute 6      Intervention   Short Term Goals To learn and exhibit compliance with exercise, home and travel O2 prescription;To learn and understand importance of monitoring SPO2 with pulse oximeter and demonstrate accurate use of the pulse oximeter.;To learn and understand importance of maintaining oxygen saturations>88%;To learn and demonstrate proper pursed lip breathing techniques or other breathing techniques.;To learn and demonstrate proper use of respiratory medications    Long  Term Goals Exhibits compliance with exercise, home and travel O2 prescription;Verbalizes importance of monitoring SPO2 with pulse oximeter and return demonstration;Maintenance of O2 saturations>88%;Exhibits proper breathing techniques, such as pursed lip breathing or other method taught during program session;Compliance with respiratory medication;Demonstrates proper use of MDI's           Oxygen  Re-Evaluation:   Oxygen Discharge (Final Oxygen Re-Evaluation):   Initial Exercise Prescription:  Initial Exercise Prescription - 04/13/20 1500      Date of Initial Exercise RX and Referring Provider   Date 04/13/20    Referring Provider FVonita MossMD      Oxygen   Oxygen Continuous    Liters 6      Recumbant Bike   Level 1    RPM 50    Watts 10    Minutes 15    METs 2      NuStep   Level 1    SPM 80    Minutes 15    METs 2      Arm Ergometer   Level 1    RPM 30    Minutes 15    METs 2      Biostep-RELP  Level 1    SPM 50    Minutes 15    METs 2      Prescription Details   Frequency (times per week) 3    Duration Progress to 30 minutes of continuous aerobic without signs/symptoms of physical distress      Intensity   THRR 40-80% of Max Heartrate 122-150    Ratings of Perceived Exertion 11-13    Perceived Dyspnea 0-4      Progression   Progression Continue to progress workloads to maintain intensity without signs/symptoms of physical distress.      Resistance Training   Training Prescription Yes    Weight 3 lb    Reps 10-15           Perform Capillary Blood Glucose checks as needed.  Exercise Prescription Changes:  Exercise Prescription Changes    Row Name 04/13/20 1500             Response to Exercise   Blood Pressure (Admit) 132/78       Blood Pressure (Exercise) 152/84       Blood Pressure (Exit) 112/60       Heart Rate (Admit) 94 bpm       Heart Rate (Exercise) 1255 bpm       Heart Rate (Exit) 104 bpm       Oxygen Saturation (Admit) 99 %       Oxygen Saturation (Exercise) 84 %       Oxygen Saturation (Exit) 97 %       Rating of Perceived Exertion (Exercise) 20       Perceived Dyspnea (Exercise) 4       Symptoms SOB, fatigue       Comments walk test results              Exercise Comments:   Exercise Goals and Review:  Exercise Goals    Row Name 04/13/20 1552             Exercise Goals   Increase Physical  Activity Yes       Intervention Provide advice, education, support and counseling about physical activity/exercise needs.;Develop an individualized exercise prescription for aerobic and resistive training based on initial evaluation findings, risk stratification, comorbidities and participant's personal goals.       Expected Outcomes Short Term: Attend rehab on a regular basis to increase amount of physical activity.;Long Term: Add in home exercise to make exercise part of routine and to increase amount of physical activity.;Long Term: Exercising regularly at least 3-5 days a week.       Increase Strength and Stamina Yes       Intervention Provide advice, education, support and counseling about physical activity/exercise needs.;Develop an individualized exercise prescription for aerobic and resistive training based on initial evaluation findings, risk stratification, comorbidities and participant's personal goals.       Expected Outcomes Short Term: Increase workloads from initial exercise prescription for resistance, speed, and METs.;Short Term: Perform resistance training exercises routinely during rehab and add in resistance training at home;Long Term: Improve cardiorespiratory fitness, muscular endurance and strength as measured by increased METs and functional capacity (6MWT)       Able to understand and use rate of perceived exertion (RPE) scale Yes       Intervention Provide education and explanation on how to use RPE scale       Expected Outcomes Short Term: Able to use RPE daily in rehab to express subjective intensity level;Long Term:  Able  to use RPE to guide intensity level when exercising independently       Able to understand and use Dyspnea scale Yes       Intervention Provide education and explanation on how to use Dyspnea scale       Expected Outcomes Short Term: Able to use Dyspnea scale daily in rehab to express subjective sense of shortness of breath during exertion;Long Term: Able to  use Dyspnea scale to guide intensity level when exercising independently       Knowledge and understanding of Target Heart Rate Range (THRR) Yes       Intervention Provide education and explanation of THRR including how the numbers were predicted and where they are located for reference       Expected Outcomes Short Term: Able to state/look up THRR;Long Term: Able to use THRR to govern intensity when exercising independently;Short Term: Able to use daily as guideline for intensity in rehab       Able to check pulse independently Yes       Intervention Provide education and demonstration on how to check pulse in carotid and radial arteries.;Review the importance of being able to check your own pulse for safety during independent exercise       Expected Outcomes Short Term: Able to explain why pulse checking is important during independent exercise;Long Term: Able to check pulse independently and accurately       Understanding of Exercise Prescription Yes       Intervention Provide education, explanation, and written materials on patient's individual exercise prescription       Expected Outcomes Short Term: Able to explain program exercise prescription;Long Term: Able to explain home exercise prescription to exercise independently              Exercise Goals Re-Evaluation :   Discharge Exercise Prescription (Final Exercise Prescription Changes):  Exercise Prescription Changes - 04/13/20 1500      Response to Exercise   Blood Pressure (Admit) 132/78    Blood Pressure (Exercise) 152/84    Blood Pressure (Exit) 112/60    Heart Rate (Admit) 94 bpm    Heart Rate (Exercise) 1255 bpm    Heart Rate (Exit) 104 bpm    Oxygen Saturation (Admit) 99 %    Oxygen Saturation (Exercise) 84 %    Oxygen Saturation (Exit) 97 %    Rating of Perceived Exertion (Exercise) 20    Perceived Dyspnea (Exercise) 4    Symptoms SOB, fatigue    Comments walk test results           Nutrition:  Target Goals:  Understanding of nutrition guidelines, daily intake of sodium <151m, cholesterol <2095m calories 30% from fat and 7% or less from saturated fats, daily to have 5 or more servings of fruits and vegetables.  Education: All About Nutrition: -Group instruction provided by verbal, written material, interactive activities, discussions, models, and posters to present general guidelines for heart healthy nutrition including fat, fiber, MyPlate, the role of sodium in heart healthy nutrition, utilization of the nutrition label, and utilization of this knowledge for meal planning. Follow up email sent as well. Written material given at graduation. [facility]  Education need identified 04/13/20      Biometrics:  Pre Biometrics - 04/13/20 1553      Pre Biometrics   Height 5' 11.1" (1.806 m)    Weight 232 lb 4.8 oz (105.4 kg)    BMI (Calculated)  32.31    Single Leg Stand --   did not do due to SOB/fatigue           Nutrition Therapy Plan and Nutrition Goals:   Nutrition Assessments:  MEDIFICTS Score Key:  ?70 Need to make dietary changes   40-70 Heart Healthy Diet  ? 40 Therapeutic Level Cholesterol Diet  Flowsheet Row Pulmonary Rehab from 04/13/2020 in [facility] Cardiac and Pulmonary Rehab  Picture Your Plate Total Score on Admission 54     Picture Your Plate Scores:  <16 Unhealthy dietary pattern with much room for improvement.  41-50 Dietary pattern unlikely to meet recommendations for good health and room for improvement.  51-60 More healthful dietary pattern, with some room for improvement.   >60 Healthy dietary pattern, although there may be some specific behaviors that could be improved.   Nutrition Goals Re-Evaluation:   Nutrition Goals Discharge (Final Nutrition Goals Re-Evaluation):   Psychosocial: Target Goals: Acknowledge presence or absence of significant depression and/or stress, maximize  coping skills, provide positive support system. Participant is able to verbalize types and ability to use techniques and skills needed for reducing stress and depression.   Education: Stress, Anxiety, and Depression - Group verbal and visual presentation to define topics covered.  Reviews how body is impacted by stress, anxiety, and depression.  Also discusses healthy ways to reduce stress and to treat/manage anxiety and depression.  Written material given at graduation.   Education: Sleep Hygiene -Provides group verbal and written instruction about how sleep can affect your health.  Define sleep hygiene, discuss sleep cycles and impact of sleep habits. Review good sleep hygiene tips.    Initial Review & Psychosocial Screening:  Initial Psych Review & Screening - 03/30/20 0945      Initial Review   Current issues with Current Stress Concerns    Source of Stress Concerns Chronic Illness      Family Dynamics   Good Support System? Yes    Comments Kennyth Lose can look to her parents and brother for support. She states that she has no stress concerns other than she cannot get around and is limited to doing daily activities.      Barriers   Psychosocial barriers to participate in program The patient should benefit from training in stress management and relaxation.      Screening Interventions   Interventions To provide support and resources with identified psychosocial needs;Provide feedback about the scores to participant;Encouraged to exercise    Expected Outcomes Short Term goal: Utilizing psychosocial counselor, staff and physician to assist with identification of specific Stressors or current issues interfering with healing process. Setting desired goal for each stressor or current issue identified.;Long Term Goal: Stressors or current issues are controlled or eliminated.;Short Term goal: Identification and review with participant of any Quality of Life or Depression concerns found by scoring  the questionnaire.;Long Term goal: The participant improves quality of Life and PHQ9 Scores as seen by post scores and/or verbalization of changes           Quality of Life Scores:  Scores of 19 and below usually indicate a poorer quality of life in these areas.  A difference of  2-3 points is a clinically meaningful difference.  A difference of 2-3 points in the total score of the Quality of Life Index has been associated with significant improvement in overall quality of life, self-image, physical symptoms, and general health in studies assessing change in quality of life.  PHQ-9: Recent Review Flowsheet Data  Depression screen [facility] 2/9 04/13/2020 03/06/2017   Decreased Interest 3 0   Down, Depressed, Hopeless 1 0   PHQ - 2 Score 4 0   Altered sleeping 3 3   Tired, decreased energy 3 3   Change in appetite 0 2   Feeling bad or failure about yourself  0 0   Trouble concentrating 0 0   Moving slowly or fidgety/restless 0 0   Suicidal thoughts 0 0   PHQ-9 Score 10 8   Difficult doing work/chores Very difficult Somewhat difficult     Interpretation of Total Score  Total Score Depression Severity:  1-4 = Minimal depression, 5-9 = Mild depression, 10-14 = Moderate depression, 15-19 = Moderately severe depression, 20-27 = Severe depression   Psychosocial Evaluation and Intervention:  Psychosocial Evaluation - 03/30/20 0947      Psychosocial Evaluation & Interventions   Interventions Stress management education;Relaxation education;Encouraged to exercise with the program and follow exercise prescription    Comments Kennyth Lose can look to her parents and brother for support. She states that she has no stress concerns other than she cannot get around and is limited to doing daily activities.    Expected Outcomes Short: Exercise regularly to support mental health and notify staff of any changes. Long: maintain mental health and well being through teaching of rehab or prescribed medications  independently.    Continue Psychosocial Services  Follow up required by staff           Psychosocial Re-Evaluation:   Psychosocial Discharge (Final Psychosocial Re-Evaluation):   Education: Education Goals: Education classes will be provided on a weekly basis, covering required topics. Participant will state understanding/return demonstration of topics presented.  Learning Barriers/Preferences:  Learning Barriers/Preferences - 03/30/20 0944      Learning Barriers/Preferences   Learning Barriers None    Learning Preferences None           General Pulmonary Education Topics:  Infection Prevention: - Provides verbal and written material to individual with discussion of infection control including proper hand washing and proper equipment cleaning during exercise session. Flowsheet Row Pulmonary Rehab from 04/13/2020 in [facility] Cardiac and Pulmonary Rehab  Date 03/30/20  Educator [facility] [facility]  Instruction Review Code 1- Verbalizes Understanding      Falls Prevention: - Provides verbal and written material to individual with discussion of falls prevention and safety. Flowsheet Row Pulmonary Rehab from 04/13/2020 in [facility] Cardiac and Pulmonary Rehab  Date 03/30/20  Educator [facility]  Instruction Review Code 1- Verbalizes Understanding      Chronic Lung Disease Review: - Group verbal instruction with posters, models, PowerPoint presentations and videos,  to review new updates, new respiratory medications, new advancements in procedures and treatments. Providing information on websites and "800" numbers for continued self-education. Includes information about supplement oxygen, available portable oxygen systems, continuous and intermittent flow rates, oxygen safety, concentrators, and Medicare reimbursement for oxygen. Explanation of Pulmonary Drugs, including class, frequency, complications, importance of spacers, rinsing mouth after steroid MDI's, and proper cleaning methods for nebulizers. Review  of basic lung anatomy and physiology related to function, structure, and complications of lung disease. Review of risk factors. Discussion about methods for diagnosing sleep apnea and types of masks and machines for OSA. Includes a review of the use of types of environmental controls: home humidity, furnaces, filters, dust mite/pet prevention, HEPA vacuums. Discussion about weather changes, air quality and the benefits of nasal washing. Instruction on Warning signs, infection symptoms, calling MD promptly, preventive modes, and value of  vaccinations. Review of effective airway clearance, coughing and/or vibration techniques. Emphasizing that all should Create an Action Plan. Written material given at graduation. Flowsheet Row Pulmonary Rehab from 04/13/2020 in [facility] Cardiac and Pulmonary Rehab  Education need identified 04/13/20      AED/CPR: - Group verbal and written instruction with the use of models to demonstrate the basic use of the AED with the basic ABC's of resuscitation.    Anatomy and Cardiac Procedures: - Group verbal and visual presentation and models provide information about basic cardiac anatomy and function. Reviews the testing methods done to diagnose heart disease and the outcomes of the test results. Describes the treatment choices: Medical Management, Angioplasty, or Coronary Bypass Surgery for treating various heart conditions including Myocardial Infarction, Angina, Valve Disease, and Cardiac Arrhythmias.  Written material given at graduation.   Medication Safety: - Group verbal and visual instruction to review commonly prescribed medications for heart and lung disease. Reviews the medication, class of the drug, and side effects. Includes the steps to properly store meds and maintain the prescription regimen.  Written material given at graduation.   Other: -Provides group and verbal instruction on various topics (see comments)   Knowledge Questionnaire Score:  Knowledge  Questionnaire Score - 04/13/20 1554      Knowledge Questionnaire Score   Pre Score 16/18 Education Focus: Nutriton and O2 safety            Core Components/Risk Factors/Patient Goals at Admission:  Personal Goals and Risk Factors at Admission - 04/13/20 1554      Core Components/Risk Factors/Patient Goals on Admission    Weight Management Yes;Weight Loss;Obesity    Intervention Weight Management: Develop a combined nutrition and exercise program designed to reach desired caloric intake, while maintaining appropriate intake of nutrient and fiber, sodium and fats, and appropriate energy expenditure required for the weight goal.;Weight Management: Provide education and appropriate resources to help participant work on and attain dietary goals.;Weight Management/Obesity: Establish reasonable short term and long term weight goals.;Obesity: Provide education and appropriate resources to help participant work on and attain dietary goals.    Admit Weight 232 lb 4.8 oz (105.4 kg)    Goal Weight: Short Term 225 lb (102.1 kg)    Goal Weight: Long Term 220 lb (99.8 kg)    Expected Outcomes Short Term: Continue to assess and modify interventions until short term weight is achieved;Long Term: Adherence to nutrition and physical activity/exercise program aimed toward attainment of established weight goal;Weight Loss: Understanding of general recommendations for a balanced deficit meal plan, which promotes 1-2 lb weight loss per week and includes a negative energy balance of [phone] kcal/d;Understanding recommendations for meals to include 15-35% energy as protein, 25-35% energy from fat, 35-60% energy from carbohydrates, less than 230m of dietary cholesterol, 20-35 gm of total fiber daily;Understanding of distribution of calorie intake throughout the day with the consumption of 4-5 meals/snacks    Improve shortness of breath with ADL's Yes    Intervention Provide education, individualized exercise plan and  daily activity instruction to help decrease symptoms of SOB with activities of daily living.    Expected Outcomes Short Term: Improve cardiorespiratory fitness to achieve a reduction of symptoms when performing ADLs;Long Term: Be able to perform more ADLs without symptoms or delay the onset of symptoms    Diabetes Yes    Intervention Provide education about signs/symptoms and action to take for hypo/hyperglycemia.;Provide education about proper nutrition, including hydration, and aerobic/resistive exercise prescription along with prescribed medications to achieve  blood glucose in normal ranges: Fasting glucose 65-99 mg/dL    Expected Outcomes Short Term: Participant verbalizes understanding of the signs/symptoms and immediate care of hyper/hypoglycemia, proper foot care and importance of medication, aerobic/resistive exercise and nutrition plan for blood glucose control.;Long Term: Attainment of HbA1C < 7%.    Hypertension Yes    Intervention Provide education on lifestyle modifcations including regular physical activity/exercise, weight management, moderate sodium restriction and increased consumption of fresh fruit, vegetables, and low fat dairy, alcohol moderation, and smoking cessation.;Monitor prescription use compliance.    Expected Outcomes Long Term: Maintenance of blood pressure at goal levels.;Short Term: Continued assessment and intervention until BP is < 140/108m HG in hypertensive participants. < 130/86mHG in hypertensive participants with diabetes, heart failure or chronic kidney disease.    Lipids Yes    Intervention Provide education and support for participant on nutrition & aerobic/resistive exercise along with prescribed medications to achieve LDL <7045mHDL >81m72m  Expected Outcomes Short Term: Participant states understanding of desired cholesterol values and is compliant with medications prescribed. Participant is following exercise prescription and nutrition guidelines.;Long  Term: Cholesterol controlled with medications as prescribed, with individualized exercise RX and with personalized nutrition plan. Value goals: LDL < 70mg67mL > 40 mg.           Education:Diabetes - Individual verbal and written instruction to review signs/symptoms of diabetes, desired ranges of glucose level fasting, after meals and with exercise. Acknowledge that pre and post exercise glucose checks will be done for 3 sessions at entry of program. Flowsheet [facility]  Date 04/13/20  [facility] 1- Verbalizes Understanding      Know Your Numbers and Heart Failure: - Group verbal and visual instruction to discuss disease risk factors for cardiac and pulmonary disease and treatment options.  Reviews associated critical values for Overweight/Obesity, Hypertension, Cholesterol, and Diabetes.  Discusses basics of heart failure: signs/symptoms and treatments.  Introduces Heart Failure Zone chart for action plan for heart failure.  Written material given at graduation.   Core Components/Risk Factors/Patient Goals Review:    Core Components/Risk Factors/Patient Goals at Discharge (Final Review):    ITP Comments:  ITP Comments    Row Name 03/30/20 0941 04/13/20 1544         ITP Comments Virtual Visit completed. Patient informed on EP and RD appointment and 6 Minute walk test. Patient also informed of patient health questionnaires on My Chart. Patient Verbalizes understanding. Visit diagnosis can be found in [facility]10/29/2021. Completed 6MWT and gym orientation. Initial ITP created and sent for review to Dr. Mark Emily Filbertical Director.             Comments: Initial ITP

## 2020-04-14 ENCOUNTER — Encounter: Payer: Self-pay | Admitting: *Deleted

## 2020-04-14 DIAGNOSIS — D869 Sarcoidosis, unspecified: Secondary | ICD-10-CM

## 2020-04-14 NOTE — Progress Notes (Signed)
Pulmonary Individual Treatment Plan  Patient Details  Name: Charlotte Cross MRN: [id number] Date of Birth: 01-20-1966 Referring Provider:   Flowsheet Row Pulmonary Rehab from 04/13/2020 in [facility] Cardiac and Pulmonary Rehab  Referring Provider Vonita Moss MD      Initial Encounter Date:  Flowsheet Row Pulmonary Rehab from 04/13/2020 in [facility] Cardiac and Pulmonary Rehab  Date 04/13/20      Visit Diagnosis: Sarcoidosis  Patient's Home Medications on Admission:  Current Outpatient Medications:  .  Accu-Chek FastClix Lancets MISC, USE TO CHECK GLUCOSE 4 TIMES DAILY BEFORE MEAL(S) AND NIGHTLY, Disp: , Rfl:  .  acetaminophen (TYLENOL) 325 MG tablet, Take by mouth., Disp: , Rfl:  .  albuterol (PROVENTIL HFA;VENTOLIN HFA) 108 (90 BASE) MCG/ACT inhaler, Inhale 2 puffs into the lungs every 6 (six) hours as needed for wheezing or shortness of breath., Disp: , Rfl:  .  albuterol (PROVENTIL) (2.5 MG/3ML) 0.083% nebulizer solution, Inhale 2.5 mg into the lungs every 6 (six) hours as needed for wheezing or shortness of breath. , Disp: , Rfl:  .  albuterol (VENTOLIN HFA) 108 (90 Base) MCG/ACT inhaler, Inhale into the lungs. (Patient not taking: Reported on 03/30/2020), Disp: , Rfl:  .  amLODipine (NORVASC) 5 MG tablet, Take by mouth., Disp: , Rfl:  .  aspirin EC 325 MG tablet, Take 325 mg by mouth daily., Disp: , Rfl:  .  Blood Glucose Monitoring Suppl (ACCU-CHEK GUIDE) w/Device KIT, 1 Device by Other route Four (4) times a day (before meals and nightly). AS DIRECTED, Disp: , Rfl:  .  budesonide (PULMICORT) 0.5 MG/2ML nebulizer solution, Inhale 2 mLs (0.5 mg total) into the lungs every 12 (twelve) hours., Disp: 120 mL, Rfl: 0 .  budesonide-formoterol (SYMBICORT) 160-4.5 MCG/ACT inhaler, Inhale into the lungs., Disp: , Rfl:  .  cyclobenzaprine (FLEXERIL) 10 MG tablet, Take 10 mg by mouth 3 (three) times daily as needed for muscle spasms. , Disp: , Rfl:  .  feeding supplement, ENSURE ENLIVE, (ENSURE  ENLIVE) LIQD, Take 237 mLs by mouth daily. (Patient not taking: Reported on 03/30/2020), Disp: 237 mL, Rfl: 12 .  furosemide (LASIX) 20 MG tablet, Take by mouth., Disp: , Rfl:  .  gabapentin (NEURONTIN) 300 MG capsule, Take 300 mg by mouth 3 (three) times daily., Disp: , Rfl:  .  glucose blood (PRECISION QID TEST) test strip, by Other route Three (3) times a day. ACCU-CHEK Guide meter, Disp: , Rfl:  .  hydrochlorothiazide (HYDRODIURIL) 25 MG tablet, Take 25 mg by mouth daily., Disp: , Rfl:  .  insulin aspart (NOVOLOG) 100 UNIT/ML injection, INJECT 1 TO 20 UNIT(S) SUBCUTANEOUSLY 4 TIMES DAILY BEFORE MEALS AND AT BEDTIME AS DIRECTED, Disp: , Rfl:  .  Insulin Glargine (BASAGLAR KWIKPEN) 100 UNIT/ML, 18 Units nightly, Disp: , Rfl:  .  Insulin Pen Needle (BD PEN NEEDLE NANO U/F) 32G X 4 MM MISC, Inject into the skin., Disp: , Rfl:  .  ipratropium (ATROVENT) 0.02 % nebulizer solution, 2 (two) times daily, Disp: , Rfl:  .  ketoconazole (NIZORAL) 2 % cream, Apply topically., Disp: , Rfl:  .  levofloxacin (LEVAQUIN) 750 MG tablet, Take 1 tablet (750 mg total) by mouth daily. (Patient not taking: Reported on 03/30/2020), Disp: 5 tablet, Rfl: 0 .  lisinopril (PRINIVIL,ZESTRIL) 10 MG tablet, Take 10 mg by mouth daily.  (Patient not taking: Reported on 03/30/2020), Disp: , Rfl:  .  Mepolizumab (NUCALA) 100 MG/ML SOAJ, Take by mouth., Disp: , Rfl:  .  metFORMIN (  GLUCOPHAGE) 1000 MG tablet, Take 1,000 mg by mouth 2 (two) times daily with a meal., Disp: , Rfl:  .  posaconazole (NOXAFIL) 100 MG TBEC delayed-release tablet, Take by mouth., Disp: , Rfl:  .  pravastatin (PRAVACHOL) 40 MG tablet, Take 40 mg by mouth at bedtime., Disp: , Rfl:  .  sildenafil (REVATIO) 20 MG tablet, Take by mouth., Disp: , Rfl:   Past Medical History: Past Medical History:  Diagnosis Date  . 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 COPD (chronic obstructive pulmonary disease) ([place])   . Diabetes mellitus without complication ([place])   . Hypertension   .  Sarcoidosis     Tobacco Use: Social History   Tobacco Use  Smoking Status Former Smoker  . Packs/day: 0.25  . Years: 6.00  . Pack years: 1.50  . Types: Cigarettes  . Quit date: 03/21/1987  . Years since quitting: 33.0  Smokeless Tobacco Never Used    Labs: Recent Review Flowsheet Data    Labs for ITP Cardiac and Pulmonary Rehab Latest Ref Rng & Units 12/24/2014 08/26/2016   Hemoglobin A1c 4.8 - 5.6 % - 12.3(H)   HCO3 21.0 - 28.0 mEq/L 29.2(H) -       Pulmonary Assessment Scores:  Pulmonary Assessment Scores    Row Name 04/13/20 1556         ADL UCSD   ADL Phase Entry     SOB Score total 79     Rest 3     Walk 4     Stairs 5     Bath 3     Dress 3     Shop 4           CAT Score   CAT Score 29           mMRC Score   mMRC Score 4            UCSD: Self-administered rating of dyspnea associated with activities of daily living (ADLs) 6-point scale (0 = "not at all" to 5 = "maximal or unable to do because of breathlessness")  Scoring Scores range from 0 to 120.  Minimally important difference is 5 units  CAT: CAT can identify the health impairment of COPD patients and is better correlated with disease progression.  CAT has a scoring range of zero to 40. The CAT score is classified into four groups of low (less than 10), medium (10 - 20), high (21-30) and very high (31-40) based on the impact level of disease on health status. A CAT score over 10 suggests significant symptoms.  A worsening CAT score could be explained by an exacerbation, poor medication adherence, poor inhaler technique, or progression of COPD or comorbid conditions.  CAT MCID is 2 points  mMRC: mMRC (Modified Medical Research Council) Dyspnea Scale is used to assess the degree of baseline functional disability in patients of respiratory disease due to dyspnea. No minimal important difference is established. A decrease in score of 1 point or greater is considered a positive change.   Pulmonary  Function Assessment:  Pulmonary Function Assessment - 04/13/20 1556      Breath   Shortness of Breath Yes;Fear of Shortness of Breath;Limiting activity;Panic with Shortness of Breath           Exercise Target Goals: Exercise Program Goal: Individual exercise prescription set using results from initial 6 min walk test and THRR while considering  patient's activity barriers and safety.   Exercise Prescription Goal: Initial exercise prescription builds to 30-45  minutes a day of aerobic activity, 2-3 days per week.  Home exercise guidelines will be given to patient during program as part of exercise prescription that the participant will acknowledge.  Education: Aerobic Exercise: - Group verbal and visual presentation on the components of exercise prescription. Introduces F.I.T.T principle from ACSM for exercise prescriptions.  Reviews F.I.T.T. principles of aerobic exercise including progression. Written material given at graduation.   Education: Resistance Exercise: - Group verbal and visual presentation on the components of exercise prescription. Introduces F.I.T.T principle from ACSM for exercise prescriptions  Reviews F.I.T.T. principles of resistance exercise including progression. Written material given at graduation.    Education: Exercise & Equipment Safety: - Individual verbal instruction and demonstration of equipment use and safety with use of the equipment. Flowsheet Row Pulmonary Rehab from 04/13/2020 in [facility] Cardiac and Pulmonary Rehab  Date 03/30/20  Educator [facility]  Instruction Review Code 1- Verbalizes Understanding      Education: Exercise Physiology & General Exercise Guidelines: - Group verbal and written instruction with models to review the exercise physiology of the cardiovascular system and associated critical values. Provides general exercise guidelines with specific guidelines to those with heart or lung disease.    Education: Flexibility, Balance, Mind/Body  Relaxation: - Group verbal and visual presentation with interactive activity on the components of exercise prescription. Introduces F.I.T.T principle from ACSM for exercise prescriptions. Reviews F.I.T.T. principles of flexibility and balance exercise training including progression. Also discusses the mind body connection.  Reviews various relaxation techniques to help reduce and manage stress (i.e. Deep breathing, progressive muscle relaxation, and visualization). Balance handout provided to take home. Written material given at graduation.   Activity Barriers & Risk Stratification:  Activity Barriers & Cardiac Risk Stratification - 04/13/20 1550      Activity Barriers & Cardiac Risk Stratification   Activity Barriers Deconditioning;Muscular Weakness;Shortness of Breath;Assistive Device;Other (comment)    Comments uses scooter now due to SOB and fatigue           6 Minute Walk:  6 Minute Walk    Row Name 04/13/20 1545         6 Minute Walk   Phase Initial     Distance 270 feet     Walk Time 2 minutes     # of Rest Breaks 2  2:47, 1:23     MPH 1.53     METS 2.06     RPE 20     Perceived Dyspnea  4     VO2 Peak 7.2     Symptoms Yes (comment)     Comments SOB, fatigue, chest tightness from breathing     Resting HR 94 bpm  initially 77% on low tank     Resting BP 132/78     Resting Oxygen Saturation  99 %     Exercise Oxygen Saturation  during 6 min walk 84 %     Max Ex. HR 125 bpm     Max Ex. BP 152/84     2 Minute Post BP 132/76           Interval HR   1 Minute HR 112     2 Minute HR 123     3 Minute HR 118     4 Minute HR 121     5 Minute HR 122     6 Minute HR 125     2 Minute Post HR 114     Interval Heart Rate? Yes  Interval Oxygen   Interval Oxygen? Yes     Baseline Oxygen Saturation % 99 %     1 Minute Oxygen Saturation % 98 %     1 Minute Liters of Oxygen 6 L  continuous e-tank     2 Minute Oxygen Saturation % 86 %  seated     2 Minute Liters  of Oxygen 6 L     3 Minute Oxygen Saturation % 88 %  seated     3 Minute Liters of Oxygen 6 L     4 Minute Oxygen Saturation % 93 %     4 Minute Liters of Oxygen 6 L     5 Minute Oxygen Saturation % 90 %  seated     5 Minute Liters of Oxygen 6 L     6 Minute Oxygen Saturation % 87 %  low at 84% at 14mn     6 Minute Liters of Oxygen 6 L     2 Minute Post Oxygen Saturation % 91 %     2 Minute Post Liters of Oxygen 6 L           Oxygen Initial Assessment:  Oxygen Initial Assessment - 04/13/20 1555      Home Oxygen   Home Oxygen Device Home Concentrator;Portable Concentrator;E-Tanks    Sleep Oxygen Prescription Continuous    Liters per minute 5    Home Exercise Oxygen Prescription Continuous    Liters per minute 5    Home Resting Oxygen Prescription Continuous    Liters per minute 5    Compliance with Home Oxygen Use Yes      Initial 6 min Walk   Oxygen Used Continuous;E-Tanks    Liters per minute 6      Program Oxygen Prescription   Program Oxygen Prescription Continuous;E-Tanks    Liters per minute 6      Intervention   Short Term Goals To learn and exhibit compliance with exercise, home and travel O2 prescription;To learn and understand importance of monitoring SPO2 with pulse oximeter and demonstrate accurate use of the pulse oximeter.;To learn and understand importance of maintaining oxygen saturations>88%;To learn and demonstrate proper pursed lip breathing techniques or other breathing techniques.;To learn and demonstrate proper use of respiratory medications    Long  Term Goals Exhibits compliance with exercise, home and travel O2 prescription;Verbalizes importance of monitoring SPO2 with pulse oximeter and return demonstration;Maintenance of O2 saturations>88%;Exhibits proper breathing techniques, such as pursed lip breathing or other method taught during program session;Compliance with respiratory medication;Demonstrates proper use of MDI's           Oxygen  Re-Evaluation:   Oxygen Discharge (Final Oxygen Re-Evaluation):   Initial Exercise Prescription:  Initial Exercise Prescription - 04/13/20 1500      Date of Initial Exercise RX and Referring Provider   Date 04/13/20    Referring Provider FVonita MossMD      Oxygen   Oxygen Continuous    Liters 6      Recumbant Bike   Level 1    RPM 50    Watts 10    Minutes 15    METs 2      NuStep   Level 1    SPM 80    Minutes 15    METs 2      Arm Ergometer   Level 1    RPM 30    Minutes 15    METs 2      Biostep-RELP  Level 1    SPM 50    Minutes 15    METs 2      Prescription Details   Frequency (times per week) 3    Duration Progress to 30 minutes of continuous aerobic without signs/symptoms of physical distress      Intensity   THRR 40-80% of Max Heartrate 122-150    Ratings of Perceived Exertion 11-13    Perceived Dyspnea 0-4      Progression   Progression Continue to progress workloads to maintain intensity without signs/symptoms of physical distress.      Resistance Training   Training Prescription Yes    Weight 3 lb    Reps 10-15           Perform Capillary Blood Glucose checks as needed.  Exercise Prescription Changes:  Exercise Prescription Changes    Row Name 04/13/20 1500             Response to Exercise   Blood Pressure (Admit) 132/78       Blood Pressure (Exercise) 152/84       Blood Pressure (Exit) 112/60       Heart Rate (Admit) 94 bpm       Heart Rate (Exercise) 1255 bpm       Heart Rate (Exit) 104 bpm       Oxygen Saturation (Admit) 99 %       Oxygen Saturation (Exercise) 84 %       Oxygen Saturation (Exit) 97 %       Rating of Perceived Exertion (Exercise) 20       Perceived Dyspnea (Exercise) 4       Symptoms SOB, fatigue       Comments walk test results              Exercise Comments:   Exercise Goals and Review:  Exercise Goals    Row Name 04/13/20 1552             Exercise Goals   Increase Physical  Activity Yes       Intervention Provide advice, education, support and counseling about physical activity/exercise needs.;Develop an individualized exercise prescription for aerobic and resistive training based on initial evaluation findings, risk stratification, comorbidities and participant's personal goals.       Expected Outcomes Short Term: Attend rehab on a regular basis to increase amount of physical activity.;Long Term: Add in home exercise to make exercise part of routine and to increase amount of physical activity.;Long Term: Exercising regularly at least 3-5 days a week.       Increase Strength and Stamina Yes       Intervention Provide advice, education, support and counseling about physical activity/exercise needs.;Develop an individualized exercise prescription for aerobic and resistive training based on initial evaluation findings, risk stratification, comorbidities and participant's personal goals.       Expected Outcomes Short Term: Increase workloads from initial exercise prescription for resistance, speed, and METs.;Short Term: Perform resistance training exercises routinely during rehab and add in resistance training at home;Long Term: Improve cardiorespiratory fitness, muscular endurance and strength as measured by increased METs and functional capacity (6MWT)       Able to understand and use rate of perceived exertion (RPE) scale Yes       Intervention Provide education and explanation on how to use RPE scale       Expected Outcomes Short Term: Able to use RPE daily in rehab to express subjective intensity level;Long Term:  Able  to use RPE to guide intensity level when exercising independently       Able to understand and use Dyspnea scale Yes       Intervention Provide education and explanation on how to use Dyspnea scale       Expected Outcomes Short Term: Able to use Dyspnea scale daily in rehab to express subjective sense of shortness of breath during exertion;Long Term: Able to  use Dyspnea scale to guide intensity level when exercising independently       Knowledge and understanding of Target Heart Rate Range (THRR) Yes       Intervention Provide education and explanation of THRR including how the numbers were predicted and where they are located for reference       Expected Outcomes Short Term: Able to state/look up THRR;Long Term: Able to use THRR to govern intensity when exercising independently;Short Term: Able to use daily as guideline for intensity in rehab       Able to check pulse independently Yes       Intervention Provide education and demonstration on how to check pulse in carotid and radial arteries.;Review the importance of being able to check your own pulse for safety during independent exercise       Expected Outcomes Short Term: Able to explain why pulse checking is important during independent exercise;Long Term: Able to check pulse independently and accurately       Understanding of Exercise Prescription Yes       Intervention Provide education, explanation, and written materials on patient's individual exercise prescription       Expected Outcomes Short Term: Able to explain program exercise prescription;Long Term: Able to explain home exercise prescription to exercise independently              Exercise Goals Re-Evaluation :   Discharge Exercise Prescription (Final Exercise Prescription Changes):  Exercise Prescription Changes - 04/13/20 1500      Response to Exercise   Blood Pressure (Admit) 132/78    Blood Pressure (Exercise) 152/84    Blood Pressure (Exit) 112/60    Heart Rate (Admit) 94 bpm    Heart Rate (Exercise) 1255 bpm    Heart Rate (Exit) 104 bpm    Oxygen Saturation (Admit) 99 %    Oxygen Saturation (Exercise) 84 %    Oxygen Saturation (Exit) 97 %    Rating of Perceived Exertion (Exercise) 20    Perceived Dyspnea (Exercise) 4    Symptoms SOB, fatigue    Comments walk test results           Nutrition:  Target Goals:  Understanding of nutrition guidelines, daily intake of sodium <151m, cholesterol <2095m calories 30% from fat and 7% or less from saturated fats, daily to have 5 or more servings of fruits and vegetables.  Education: All About Nutrition: -Group instruction provided by verbal, written material, interactive activities, discussions, models, and posters to present general guidelines for heart healthy nutrition including fat, fiber, MyPlate, the role of sodium in heart healthy nutrition, utilization of the nutrition label, and utilization of this knowledge for meal planning. Follow up email sent as well. Written material given at graduation. [facility]  Education need identified 04/13/20      Biometrics:  Pre Biometrics - 04/13/20 1553      Pre Biometrics   Height 5' 11.1" (1.806 m)    Weight 232 lb 4.8 oz (105.4 kg)    BMI (Calculated)  32.31    Single Leg Stand --   did not do due to SOB/fatigue           Nutrition Therapy Plan and Nutrition Goals:   Nutrition Assessments:  MEDIFICTS Score Key:  ?70 Need to make dietary changes   40-70 Heart Healthy Diet  ? 40 Therapeutic Level Cholesterol Diet  Flowsheet Row Pulmonary Rehab from 04/13/2020 in [facility] Cardiac and Pulmonary Rehab  Picture Your Plate Total Score on Admission 54     Picture Your Plate Scores:  <16 Unhealthy dietary pattern with much room for improvement.  41-50 Dietary pattern unlikely to meet recommendations for good health and room for improvement.  51-60 More healthful dietary pattern, with some room for improvement.   >60 Healthy dietary pattern, although there may be some specific behaviors that could be improved.   Nutrition Goals Re-Evaluation:   Nutrition Goals Discharge (Final Nutrition Goals Re-Evaluation):   Psychosocial: Target Goals: Acknowledge presence or absence of significant depression and/or stress, maximize  coping skills, provide positive support system. Participant is able to verbalize types and ability to use techniques and skills needed for reducing stress and depression.   Education: Stress, Anxiety, and Depression - Group verbal and visual presentation to define topics covered.  Reviews how body is impacted by stress, anxiety, and depression.  Also discusses healthy ways to reduce stress and to treat/manage anxiety and depression.  Written material given at graduation.   Education: Sleep Hygiene -Provides group verbal and written instruction about how sleep can affect your health.  Define sleep hygiene, discuss sleep cycles and impact of sleep habits. Review good sleep hygiene tips.    Initial Review & Psychosocial Screening:  Initial Psych Review & Screening - 03/30/20 0945      Initial Review   Current issues with Current Stress Concerns    Source of Stress Concerns Chronic Illness      Family Dynamics   Good Support System? Yes    Comments Kennyth Lose can look to her parents and brother for support. She states that she has no stress concerns other than she cannot get around and is limited to doing daily activities.      Barriers   Psychosocial barriers to participate in program The patient should benefit from training in stress management and relaxation.      Screening Interventions   Interventions To provide support and resources with identified psychosocial needs;Provide feedback about the scores to participant;Encouraged to exercise    Expected Outcomes Short Term goal: Utilizing psychosocial counselor, staff and physician to assist with identification of specific Stressors or current issues interfering with healing process. Setting desired goal for each stressor or current issue identified.;Long Term Goal: Stressors or current issues are controlled or eliminated.;Short Term goal: Identification and review with participant of any Quality of Life or Depression concerns found by scoring  the questionnaire.;Long Term goal: The participant improves quality of Life and PHQ9 Scores as seen by post scores and/or verbalization of changes           Quality of Life Scores:  Scores of 19 and below usually indicate a poorer quality of life in these areas.  A difference of  2-3 points is a clinically meaningful difference.  A difference of 2-3 points in the total score of the Quality of Life Index has been associated with significant improvement in overall quality of life, self-image, physical symptoms, and general health in studies assessing change in quality of life.  PHQ-9: Recent Review Flowsheet Data  Depression screen [facility] 2/9 04/13/2020 03/06/2017   Decreased Interest 3 0   Down, Depressed, Hopeless 1 0   PHQ - 2 Score 4 0   Altered sleeping 3 3   Tired, decreased energy 3 3   Change in appetite 0 2   Feeling bad or failure about yourself  0 0   Trouble concentrating 0 0   Moving slowly or fidgety/restless 0 0   Suicidal thoughts 0 0   PHQ-9 Score 10 8   Difficult doing work/chores Very difficult Somewhat difficult     Interpretation of Total Score  Total Score Depression Severity:  1-4 = Minimal depression, 5-9 = Mild depression, 10-14 = Moderate depression, 15-19 = Moderately severe depression, 20-27 = Severe depression   Psychosocial Evaluation and Intervention:  Psychosocial Evaluation - 03/30/20 0947      Psychosocial Evaluation & Interventions   Interventions Stress management education;Relaxation education;Encouraged to exercise with the program and follow exercise prescription    Comments Kennyth Lose can look to her parents and brother for support. She states that she has no stress concerns other than she cannot get around and is limited to doing daily activities.    Expected Outcomes Short: Exercise regularly to support mental health and notify staff of any changes. Long: maintain mental health and well being through teaching of rehab or prescribed medications  independently.    Continue Psychosocial Services  Follow up required by staff           Psychosocial Re-Evaluation:   Psychosocial Discharge (Final Psychosocial Re-Evaluation):   Education: Education Goals: Education classes will be provided on a weekly basis, covering required topics. Participant will state understanding/return demonstration of topics presented.  Learning Barriers/Preferences:  Learning Barriers/Preferences - 03/30/20 0944      Learning Barriers/Preferences   Learning Barriers None    Learning Preferences None           General Pulmonary Education Topics:  Infection Prevention: - Provides verbal and written material to individual with discussion of infection control including proper hand washing and proper equipment cleaning during exercise session. Flowsheet Row Pulmonary Rehab from 04/13/2020 in [facility] Cardiac and Pulmonary Rehab  Date 03/30/20  Educator [facility] [facility]  Instruction Review Code 1- Verbalizes Understanding      Falls Prevention: - Provides verbal and written material to individual with discussion of falls prevention and safety. Flowsheet Row Pulmonary Rehab from 04/13/2020 in [facility] Cardiac and Pulmonary Rehab  Date 03/30/20  Educator [facility]  Instruction Review Code 1- Verbalizes Understanding      Chronic Lung Disease Review: - Group verbal instruction with posters, models, PowerPoint presentations and videos,  to review new updates, new respiratory medications, new advancements in procedures and treatments. Providing information on websites and "800" numbers for continued self-education. Includes information about supplement oxygen, available portable oxygen systems, continuous and intermittent flow rates, oxygen safety, concentrators, and Medicare reimbursement for oxygen. Explanation of Pulmonary Drugs, including class, frequency, complications, importance of spacers, rinsing mouth after steroid MDI's, and proper cleaning methods for nebulizers. Review  of basic lung anatomy and physiology related to function, structure, and complications of lung disease. Review of risk factors. Discussion about methods for diagnosing sleep apnea and types of masks and machines for OSA. Includes a review of the use of types of environmental controls: home humidity, furnaces, filters, dust mite/pet prevention, HEPA vacuums. Discussion about weather changes, air quality and the benefits of nasal washing. Instruction on Warning signs, infection symptoms, calling MD promptly, preventive modes, and value of  vaccinations. Review of effective airway clearance, coughing and/or vibration techniques. Emphasizing that all should Create an Action Plan. Written material given at graduation. Flowsheet Row Pulmonary Rehab from 04/13/2020 in [facility] Cardiac and Pulmonary Rehab  Education need identified 04/13/20      AED/CPR: - Group verbal and written instruction with the use of models to demonstrate the basic use of the AED with the basic ABC's of resuscitation.    Anatomy and Cardiac Procedures: - Group verbal and visual presentation and models provide information about basic cardiac anatomy and function. Reviews the testing methods done to diagnose heart disease and the outcomes of the test results. Describes the treatment choices: Medical Management, Angioplasty, or Coronary Bypass Surgery for treating various heart conditions including Myocardial Infarction, Angina, Valve Disease, and Cardiac Arrhythmias.  Written material given at graduation.   Medication Safety: - Group verbal and visual instruction to review commonly prescribed medications for heart and lung disease. Reviews the medication, class of the drug, and side effects. Includes the steps to properly store meds and maintain the prescription regimen.  Written material given at graduation.   Other: -Provides group and verbal instruction on various topics (see comments)   Knowledge Questionnaire Score:  Knowledge  Questionnaire Score - 04/13/20 1554      Knowledge Questionnaire Score   Pre Score 16/18 Education Focus: Nutriton and O2 safety            Core Components/Risk Factors/Patient Goals at Admission:  Personal Goals and Risk Factors at Admission - 04/13/20 1554      Core Components/Risk Factors/Patient Goals on Admission    Weight Management Yes;Weight Loss;Obesity    Intervention Weight Management: Develop a combined nutrition and exercise program designed to reach desired caloric intake, while maintaining appropriate intake of nutrient and fiber, sodium and fats, and appropriate energy expenditure required for the weight goal.;Weight Management: Provide education and appropriate resources to help participant work on and attain dietary goals.;Weight Management/Obesity: Establish reasonable short term and long term weight goals.;Obesity: Provide education and appropriate resources to help participant work on and attain dietary goals.    Admit Weight 232 lb 4.8 oz (105.4 kg)    Goal Weight: Short Term 225 lb (102.1 kg)    Goal Weight: Long Term 220 lb (99.8 kg)    Expected Outcomes Short Term: Continue to assess and modify interventions until short term weight is achieved;Long Term: Adherence to nutrition and physical activity/exercise program aimed toward attainment of established weight goal;Weight Loss: Understanding of general recommendations for a balanced deficit meal plan, which promotes 1-2 lb weight loss per week and includes a negative energy balance of [phone] kcal/d;Understanding recommendations for meals to include 15-35% energy as protein, 25-35% energy from fat, 35-60% energy from carbohydrates, less than 255m of dietary cholesterol, 20-35 gm of total fiber daily;Understanding of distribution of calorie intake throughout the day with the consumption of 4-5 meals/snacks    Improve shortness of breath with ADL's Yes    Intervention Provide education, individualized exercise plan and  daily activity instruction to help decrease symptoms of SOB with activities of daily living.    Expected Outcomes Short Term: Improve cardiorespiratory fitness to achieve a reduction of symptoms when performing ADLs;Long Term: Be able to perform more ADLs without symptoms or delay the onset of symptoms    Diabetes Yes    Intervention Provide education about signs/symptoms and action to take for hypo/hyperglycemia.;Provide education about proper nutrition, including hydration, and aerobic/resistive exercise prescription along with prescribed medications to achieve  blood glucose in normal ranges: Fasting glucose 65-99 mg/dL    Expected Outcomes Short Term: Participant verbalizes understanding of the signs/symptoms and immediate care of hyper/hypoglycemia, proper foot care and importance of medication, aerobic/resistive exercise and nutrition plan for blood glucose control.;Long Term: Attainment of HbA1C < 7%.    Hypertension Yes    Intervention Provide education on lifestyle modifcations including regular physical activity/exercise, weight management, moderate sodium restriction and increased consumption of fresh fruit, vegetables, and low fat dairy, alcohol moderation, and smoking cessation.;Monitor prescription use compliance.    Expected Outcomes Long Term: Maintenance of blood pressure at goal levels.;Short Term: Continued assessment and intervention until BP is < 140/6m HG in hypertensive participants. < 130/850mHG in hypertensive participants with diabetes, heart failure or chronic kidney disease.    Lipids Yes    Intervention Provide education and support for participant on nutrition & aerobic/resistive exercise along with prescribed medications to achieve LDL <7074mHDL >10m102m  Expected Outcomes Short Term: Participant states understanding of desired cholesterol values and is compliant with medications prescribed. Participant is following exercise prescription and nutrition guidelines.;Long  Term: Cholesterol controlled with medications as prescribed, with individualized exercise RX and with personalized nutrition plan. Value goals: LDL < 70mg67mL > 40 mg.           Education:Diabetes - Individual verbal and written instruction to review signs/symptoms of diabetes, desired ranges of glucose level fasting, after meals and with exercise. Acknowledge that pre and post exercise glucose checks will be done for 3 sessions at entry of program. [facility]  Date 04/13/20  [facility] 1- Verbalizes Understanding      Know Your Numbers and Heart Failure: - Group verbal and visual instruction to discuss disease risk factors for cardiac and pulmonary disease and treatment options.  Reviews associated critical values for Overweight/Obesity, Hypertension, Cholesterol, and Diabetes.  Discusses basics of heart failure: signs/symptoms and treatments.  Introduces Heart Failure Zone chart for action plan for heart failure.  Written material given at graduation.   Core Components/Risk Factors/Patient Goals Review:    Core Components/Risk Factors/Patient Goals at Discharge (Final Review):    ITP Comments:  ITP Comments    Row Name 03/30/20 0941 04/13/20 1544 04/14/20 0937       ITP Comments Virtual Visit completed. Patient informed on EP and RD appointment and 6 Minute walk test. Patient also informed of patient health questionnaires on My Chart. Patient Verbalizes understanding. Visit diagnosis can be found in [facility]10/29/2021. Completed 6MWT and gym orientation. Initial ITP created and sent for review to Dr. Mark Emily Filbertical Director. 30 Day review completed. Medical Director ITP review done, changes made as directed, and signed approval by Medical Director.   New to program            Comments:

## 2020-04-21 DIAGNOSIS — B441 Other pulmonary aspergillosis: Principal | ICD-10-CM

## 2020-04-21 DIAGNOSIS — R0600 Dyspnea, unspecified: Principal | ICD-10-CM

## 2020-04-21 DIAGNOSIS — J984 Other disorders of lung: Principal | ICD-10-CM

## 2020-04-21 NOTE — Unmapped (Signed)
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 Initially treated with voriconazole for chronic pulmonary aspergillosis. This was transitioned to posaconazole to reduce potential toxicities after discussing risks and benefits of stopping therapy completely as it was unlikely to be curative and she is not a transplant candidate due to anatomical concerns. She has continued on this for several years now without adverse effect, so potential benefits of controlling infection have seemed to outweigh risks in shared decision-making discussions.    Primary issues over the last 1-2 years have been easy fatigability, chronic DOE, cough, and increased O2 requirement. She has established care with [facility], and they have started her on inhaled treprostinil and amlodipine for pulmonary hypertension, which was noted to be far out of proportion with her lung disease per RHC (thought to have primary PAH as well). She has started to notice some small improvements in her breathing since then. She has also started Pulmonary Rehab in the last few weeks. Remains on prednisone 5mg  daily, but no other immunosuppression. Has not noticed any side effects from the posaconazole. Has not had a level checked since April 2021, so she will do this on Friday.     Today:  - CONTINUE posaconazole 200mg  daily. Levels have been more than therapeutic, and she is not experiencing side effects. Discuss at each visit risks and benefits of trial off of this medication, elected to continue for now.   - LFTs done 02/2020 and wnl. Repeat posaconazole trough 04/23/20 AM  - Continue to follow with [facility] re: sarcoid, pulm HTN, asthma. 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  - Worsening respiratory symptoms w/ mediastinal lymphadenopathy and fibrosis progressive on 01/2018 CT concerning for progression of sarcoidosis  - PET CT 08/2018 with metabolically active pulm fibrosis and hilar/mediastional LNs  - Transitioned care to [facility] from [facility] 10/2019  - Now w/ severe PAH on RHC done at [facility] (previously mild), out of proportion with lung disease  - Not a lung transplant candidate 2/2 anatomy after past surgery  ??  Chronic cavitary pulmonary aspergillosis, first diagnosed 2010  - previously treated with multiple courses of voriconazole  - 2016 cultures with Aspergillus fumigatus  - s/p LUL lobectomy/VATS 12/03/14 after episodes of hemoptysis followed by voriconazole 11/20/14-04/2015   - New L apical thick walled cavitary lesion and adjacent lung parenchymal abnormalities 09/08/16 in setting of 1 month of worsening pulmonary??infectious??symptoms, leukocytosis (on steroids). Sputum cultures: Aspergillus [place], OPF. Treated with vanc/cefepime/flagyl --> Augmentin x 2 weeks, voriconazole  - Re-presented 09/25/16 w/ increasing cough, sputum production, and fatigue  - CT 09/25/16 with new mycetoma in remaining left lung and worsening consolidation. LRCx Aspergillus [place]  - Continued on voriconazole since July 2018, troughs mostly therapeutic or just slightly subtherapeutic.  - 02/13/2018 CT: decreased size of L apical cavity soft tissue density  - Posaconazole 03/2018-present (dose decreased based on levels  to 200mg  daily)  ??  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  Pertinent past Infections  - H/o Achromobacter pneumonia 11/18/14, treated w meropenem x 14 d; 09/25/16, treated with 4 weeks meropenem  - H/o Stenotrophomonas (S: minocycline, I: levoflox R: bactrim, ceftaz)??PNA 12/03/14 treated with minocycline x 14d  - Hepatitis C Ab-positive, VL neg  - Nutritionally variant Strep bacteremia, 06/2014  ??  ??  Education and counseling took 30 minutes of today's visit.  ??  Duanne Limerick, MD, MHS  Assistant Professor, [facility] Division of Infectious Diseases  ??  [facility]   [address], 5th floor  [place], [place] [address]   Phone: [phone]   Fax: [phone]   ??  _____________________________________________________________________  ??  ??  CC: F/u of mycetoma, chronic cavitary pulmonary aspergillosis  ??  HPI: ??55yoF w/ h/o sarcoidosis, severe eosinophilic asthma, bronchiectasis, chronic cavitary pulmonary aspergillosis s/p resection in 2016 currently on posaconazole for suppressive therapy, and positive M.fortuitum sputum cultures. Initially had response to Nucala, but only temporarily, so asthma not thought to be driving cause of her chronic symptoms. Based on CT and PET scan findings in 2020, active pulm sarcoid thought to be primary driver of symptoms. However, since then has established care with [facility] and had a repeat RHC late 2021 that showed severe PAH out of proportion with lung disease, so thought to have primary PAH as well.     Started on inhaled treprostinil and amlodipine in the last month or so, has noticed some improvement. Also starting pulm rehab and has a sleep study scheduled for 04/23/20. Currently reports having a cold (negative testing for COVID) that is getting better. She didn't make it to the in person appt today because of this so we spoke over the phone. Taking posaconazole regularly, no issues with side effects.    Since our last visit also developed osteo of 5th toe, now s/p amputation. AFB, fungal cx negative. Aerobic/anaerobic grew Enterbacter cloacae, Serratia marcescens, and skin flora. NO osteo seen on f/u xr of R foot 03/23/20. Continues to follow with Podiatry for ulcers between her 3rd and 4th toes.      Past Medical History:   Diagnosis Date   ??? 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 Breast injury     lung surg on left incision under breast sept 2016   ??? Caregiver burden     parents    ??? Hepatitis C antibody test positive 2016    repeatedly negative HCV RNA, indicating clearance of infection w/o treatment   ??? Hyperlipidemia    ??? Hypertension    ??? 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 On home O2 2008    per Dr Mal Amabile note from 02/21/2017   ??? Pulmonary aspergillosis (CMS-HCC) 2016    A.fumigatus in 2016, prior to LUL lobectomy. A.niger from sputum 08/2016-09/2016.   ??? 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 Sarcoidosis 1995   ??? Type 2 diabetes mellitus with hyperglycemia (CMS-HCC) 07/27/2014   ???  Visual impairment     glasses       Past Surgical History:   Procedure Laterality Date   ??? LUNG LOBECTOMY     ??? PR AMPUTATION TOE,MT-P JT Right 12/12/2019    Procedure: Right foot fifth toe amputation at the metatarsophalangeal joint level;  Surgeon: Britt Bottom, DPM;  Location: MAIN OR [facility];  Service: Vascular   ??? PR RIGHT HEART CATH O2 SATURATION & CARDIAC OUTPUT N/A 04/29/2019    Procedure: Right Heart Catheterization;  Surgeon: Rosana Hoes, MD;  Location: [facility] CATH;  Service: Cardiology   ??? PR THORACOSCOPY SURG TOT PULM DECORT Left 12/14/2014    Procedure: THORACOSCOPY SURG; W/TOT PULM DECORTIC/PNEUMOLYS;  Surgeon: Evert Kohl, MD;  Location: MAIN OR [facility]; Service: Cardiothoracic   ??? PR THORACOSCOPY W/THERA WEDGE RESEXN INITIAL UNILAT Left 12/03/2014    Procedure: THORACOSCOPY, SURGICAL; WITH THERAPEUTIC WEDGE RESECTION (EG, MASS, NODULE) INITIAL UNILATERAL;  Surgeon: Evert Kohl, MD;  Location: MAIN OR [facility];  Service: Cardiothoracic       No Known Allergies    Current Outpatient Medications   Medication Sig Dispense Refill   ??? ACCU-CHEK FASTCLIX LANCET DRUM Misc USE TO CHECK GLUCOSE 4 TIMES DAILY BEFORE MEAL(S) AND NIGHTLY     ??? ACCU-CHEK GUIDE GLUCOSE METER Misc 1 Device by Other route Four (4) times a day (before meals and nightly). AS DIRECTED 1 kit 0   ??? acetaminophen (TYLENOL) 325 MG tablet Take 2 tablets (650 mg total) by mouth every four (4) hours as needed. 30 tablet 1   ??? albuterol 2.5 mg /3 mL (0.083 %) nebulizer solution Inhale 3 mL (2.5 mg total) by nebulization every six (6) hours as needed for wheezing or shortness of breath (airway clearance/cough). 120 mL 0   ??? albuterol HFA 90 mcg/actuation inhaler Inhale 2 puffs every four (4) hours as needed for wheezing or shortness of breath. 18 g 6   ??? amLODIPine (NORVASC) 5 MG tablet Take 5 mg by mouth daily. Added per patient     ??? blood sugar diagnostic Strp by Other route Three (3) times a day. ACCU-CHEK Guide meter 200 strip 2   ??? budesonide-formoteroL (SYMBICORT) 160-4.5 mcg/actuation inhaler Inhale 2 puffs Two (2) times a day. 10.2 g 3   ??? cyclobenzaprine (FLEXERIL) 10 MG tablet Take 1 tablet (10 mg total) by mouth Three (3) times a day as needed. 90 tablet 0   ??? empty container Misc USE AS DIRECTED 1 each 2   ??? gabapentin (NEURONTIN) 300 MG capsule Take 1 capsule (300 mg total) by mouth Three (3) times a day. 270 capsule 1   ??? hydroCHLOROthiazide (HYDRODIURIL) 25 MG tablet Take 1 tablet by mouth once daily 90 tablet 0   ??? insulin ASPART (NOVOLOG U-100 INSULIN ASPART) 100 unit/mL injection Take 1-20 units SQ QID AC and HS as directed 80 mL 1   ??? insulin glargine (BASAGLAR, LANTUS) 100 unit/mL (3 mL) injection pen Inject 0.18 mL (18 Units total) under the skin daily. 18 units (Patient taking differently: Inject 24 Units under the skin daily. 18 units) 18 mL 1   ??? ipratropium (ATROVENT) 0.02 % nebulizer solution Inhale 2.5 mL (500 mcg total) by nebulization Four (4) times a day. (Patient taking differently: Inhale 500 mcg by nebulization Two (2) times a day. ) 900 mL 1   ??? ketoconazole (NIZORAL) 2 % cream Apply 1 application topically Two (2) times a day. 60 g 5   ??? MEDICAL SUPPLY ITEM Accucheck  Glide Glocumeter for home glucose checks. Check blood glucose ACHS. 1 Package 0   ??? mepolizumab (NUCALA) 100 mg/mL AtIn Inject the contents of 1 syringe (100 mg) under the skin every twenty-eight (28) days. 1 mL 12   ??? metFORMIN (GLUCOPHAGE) 1000 MG tablet Take 1 tablet (1,000 mg total) by mouth 2 (two) times a day with meals. 180 tablet 1   ??? miscellaneous medical supply Misc Nebulizer machine and tubing. Patients machine is 57 years old. 1 each 0   ??? OXYGEN-AIR DELIVERY SYSTEMS MISC Dose: 2-3 LPM     ??? posaconazole (NOXAFIL) 100 mg TbEC delayed released tablet Take 200 mg by mouth daily. 60 tablet 2   ??? pravastatin (PRAVACHOL) 40 MG tablet Take 1 tablet (40 mg total) by mouth daily with evening meal. 90 tablet 1   ??? sildenafiL, pulm.hypertension, (REVATIO) 20 mg tablet Take 1 tablet (20 mg total) by mouth Three (3) times a day. 90 tablet 11     No current facility-administered medications for this visit.       Social history:  Reviewed and unchanged except as noted in the HPI.    Family History   Problem Relation Age of Onset   ??? Diabetes Father    ??? Diabetes Brother    ??? Diabetes Maternal Aunt    ??? Sarcoidosis Maternal Aunt    ??? Diabetes Maternal Uncle    ??? Diabetes Paternal Aunt    ??? Diabetes Paternal Uncle        ROS: 12 systems reviewed and negative except as per HPI.     PE:  Telephone visit only - no vitals or exam.    Labs:    Personally reviewed and interpreted labs from 02/2020 at [facility] - normal LFTs.      Drug monitoring:  Posa levels   - 05/22/18: 2,552 --> dose decreased to 200mg  daily  - 08/28/18: 1,636 --> continue 200mg  daily  - 05/30/19 (random): 1,874 --> no change  - 07/10/19 (trough): 2,058 ---> no change as she is tolerating well.    Lab Results   Component Value Date    ALKPHOS 88 12/11/2019    BILITOT 0.5 12/11/2019    BILIDIR <0.10 05/30/2019    PROT 7.1 12/11/2019    ALBUMIN 3.3 (L) 12/11/2019    ALT 15 12/11/2019    AST 16 12/11/2019       Microbiology:  2021:   12/12/19: R foot 5th toe bone - enterobacter cloacae, serratia marcesccens  08/22/19: Sputum  - AFB neg  - Fungal culture with multiple morphologies of mould  - LRCx not processed    2020:  04/25/18: sputum AFB cx +mycobacterium fortuitum    2019:  02/14/18: OPF, fungal neg, AFB NGTD; galactomannan <0.5, fungitell neg  9/19, 9/29: AFB cx +mycobacterium fortuitum  Mycobacterium fortuitum group       MIC SUSCEPTIBILITY RESULT     Amikacin 2  Susceptible     Cefoxitin 16  Susceptible     Ciprofloxacin 0.5  Susceptible     Clarithromycin >16  Resistant1     Doxycycline 4  Intermediate     Imipenem 8  Intermediate2     Linezolid 2  Susceptible     Minocycline 2  Intermediate     Moxifloxacin 0.25  Susceptible     Tigecycline 0.03  No Interpretation     Trimethoprim + Sulfamethoxazole 0.25  Susceptible      2018:  02/19/17: OPF, fungal neg, AFB neg  7/12  LRCx: Aspergillus [place], 3+ Achromobacter species, 3+ Enterococcus faecium  7/10 Sputum fungal culture: Aspergillus [place]  7/9 LRCx: Aspergillus [place], OP flora, Achromobacter spp.    AFB smear x3 negative; cx NGTD      Imaging:   No recent imaging.        The patient reports they are currently: at home. I spent 10 minutes on the phone with the patient on the date of service. I spent an additional 15 minutes on pre- and post-visit activities on the date of service.     The patient was physically located in [place] or a state in which I am permitted to provide care. The patient and/or parent/guardian understood that s/he may incur co-pays and cost sharing, and agreed to the telemedicine visit. The visit was reasonable and appropriate under the circumstances given the patient's presentation at the time.    The patient and/or parent/guardian has been advised of the potential risks and limitations of this mode of treatment (including, but not limited to, the absence of in-person examination) and has agreed to be treated using telemedicine. The patient's/patient's family's questions regarding telemedicine have been answered.     If the visit was completed in an ambulatory setting, the patient and/or parent/guardian has also been advised to contact their provider???s office for worsening conditions, and seek emergency medical treatment and/or call 911 if the patient deems either necessary.

## 2020-04-21 NOTE — Unmapped (Signed)
It was good to talk with you today. Glad to hear your breathing is getting a bit better. Please get a posaconazole level checked on Friday at your primary care doctor's office.

## 2020-04-26 ENCOUNTER — Other Ambulatory Visit: Payer: Self-pay

## 2020-04-26 ENCOUNTER — Encounter: Payer: Medicare Other | Attending: Critical Care Medicine

## 2020-04-26 DIAGNOSIS — B441 Other pulmonary aspergillosis: Principal | ICD-10-CM

## 2020-04-26 DIAGNOSIS — D869 Sarcoidosis, unspecified: Secondary | ICD-10-CM

## 2020-04-26 DIAGNOSIS — J9611 Chronic respiratory failure with hypoxia: Secondary | ICD-10-CM | POA: Insufficient documentation

## 2020-04-26 DIAGNOSIS — Z87891 Personal history of nicotine dependence: Secondary | ICD-10-CM | POA: Diagnosis not present

## 2020-04-26 DIAGNOSIS — D860 Sarcoidosis of lung: Secondary | ICD-10-CM | POA: Insufficient documentation

## 2020-04-26 LAB — GLUCOSE, CAPILLARY: Glucose-Capillary: 346 mg/dL — ABNORMAL HIGH (ref 70–99)

## 2020-04-26 MED ORDER — POSACONAZOLE 100 MG TABLET,DELAYED RELEASE
ORAL_TABLET | Freq: Every day | ORAL | 2 refills | 30 days | Status: CP
Start: 2020-04-26 — End: ?

## 2020-04-26 NOTE — Unmapped (Unsigned)
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 Patient's condition was discussed with the patient in detail.  Patient was given opportunity to ask questions.     Ulcers noted between 3rd and 4th toes, right foot at PIPJ level, probes almost to capsule, granulofibrotic wound base noted, peri wound hyperkeratotic tissue noted, no purulent drainage noted, no localized signs of infection noted.     ***Right foot XR from 03/23/20 reviewed in detail with patient showing no radiographic findings of osteomyelitis.     Procedure: excisional debridement of the right foot wounds, 3rd toe lateral and 4th toe medial  At this time patient's wound was debrided due to the increase in devitalized tissue of epidermis/dermis, exudate, callus tissue, and other necrotic/fibrotic tissue impairing proper wound healing as reported in the Objective. All benefits, risks, and alternatives were explained in detail with the patient. A time out was taken prior to the procedure. The area was prepped in standard clinic fashion. An excisional Sharp, surgical debridement was performed to fully debride and remove all central devitalized and necrotic tissue including subcutaneous tissue. The debridement occurred to the level of subcutaneous tissue until  bleeding granulation tissue was observed in 100% of the wound bed.  This was achieved with the use of a curette, forceps and scalpel. Blood loss was minimal.  Hemostasis was achieved with pressure and the post-debridement measurements are documented in the chart. Patient tolerated procedure well at this time.    Dressings: Betadine, gauze, and tape applied. Patient should continue to change dressings daily and to avoid soaking foot.     ***Discussed with patient beginning to wash web spaces daily with antibacterial soap, making sure to dry thoroughly before applying dressings and separating her 3rd and 4th toes. She should avoid applying ketoconazole to her wounds, as this medication macerates the area.     Activity: WBAT in [facility] shoe to right foot. .    Patient to check feet daily for ulcers.     Continue application of ketoconazole 2% BID to affected nails and areas of feet.     RTC in***    All questions were answered and no guarantees were given or implied. Patient expressed understanding.     History of Present Illness: Patient is a 55 y.o.  female with a history of T2DM, HTN, COPD, hypoxia, sarcoidosis, severe persistent asthma dependent on systemic steroids who returns to the clinic regarding right foot ulcers between 3rd and 4th toes at PIPJ level.       Patient denies any other complaints. Patient denies fever, nausea, vomiting, chills, SOB, chest pain      A1c was 10.1% on 12/12/2019    Past Medical History:   Diagnosis Date   ??? 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 Breast injury     lung surg on left incision under breast sept 2016   ??? Caregiver burden     parents    ???  Hepatitis C antibody test positive 2016    repeatedly negative HCV RNA, indicating clearance of infection w/o treatment   ??? Hyperlipidemia    ??? Hypertension    ??? 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 On home O2 2008    per Dr Mal Amabile note from 02/21/2017   ??? Pulmonary aspergillosis (CMS-HCC) 2016    A.fumigatus in 2016, prior to LUL lobectomy. A.niger from sputum 08/2016-09/2016.   ??? 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 Sarcoidosis 1995   ??? Type 2 diabetes mellitus with hyperglycemia (CMS-HCC) 07/27/2014   ??? Visual impairment     glasses     No Known Allergies     ROS:   Constitutional:  Denies fever or chills   Eyes:  Denies change in visual acuity   HENT:  Denies nasal congestion or sore throat   Respiratory:  Denies cough or shortness of breath   Cardiovascular:  Denies chest pain or edema   GI:  Denies abdominal pain, nausea, vomiting, bloody stools or diarrhea   GU:  Denies dysuria   Musculoskeletal:  Denies back pain or joint pain   Integument:  Denies rash   Neurologic:  Denies headache, focal weakness or sensory changes   Endocrine:  Denies polyuria or polydipsia   Lymphatic:  Denies swollen glands   Psychiatric:  Denies depression or anxiety     Test Results  Imaging: Pending    Physical:   General Appearance: well nourished, Aox3    Vascular: Stable and unchanged.    Skin: Ulcers noted between 3rd and 4th toes, right foot at PIPJ level, probes almost to capsule, granulofibrotic wound base noted, peri wound hyperkeratotic tissue noted, no purulent drainage noted, no localized signs of infection noted. Medial 4th toe wound measures 1.5 x 0.5 x 0.4 cm. Lateral 3rd toe wound measures 1 x 1 x 0.4 cm.     Dry, flaky skin noted to plantar aspect of bilateral feet.   Nails of are elongated, discolored, distrophic with sunungual debris x 10.    Neurological: Protective sensation is diminished bilaterally. Sharp/dull sensation is diminished bilaterally.     MSK: Muscle strength exam deferred due to post-op.    ***pic      I personally spent 20 minutes face-to-face and non-face-to-face in the care of this patient, which includes all pre, intra, and post visit time on the date of service.    Scribe's Attestation: Otho Perl, DPM obtained and performed the history, physical exam and medical decision making elements that were  entered into the chart. Documentation assistance was provided by me personally, a scribe. Signed by Azalee Course, Scribe, on April 27, 2020 at 8:03 AM.     {*** NOTE TO PROVIDER: PLEASE ADD ATTESTATION NOTING YOU AGREE WITH SCRIBE DOCUMENTATION. ***}

## 2020-04-26 NOTE — Progress Notes (Signed)
Incomplete Session Note  Patient Details  Name: Charlotte Cross MRN: [id number] Date of Birth: 1966-03-15 Referring Provider:   Flowsheet Row Pulmonary Rehab from 04/13/2020 in [facility] Cardiac and Pulmonary Rehab  Referring Provider Vonita Moss MD      [name] did not complete her rehab session.  Her blood glucose finger stick reading was 346. Patient was educated by Lenna Sciara, RDN, LDN, concerning proper diet to manage blood glucose level prior to exercise. Patient encouraged to take her medicine when she got home and recheck her blood glucose level in an hour. A nutrition appointment was scheduled prior to next exercise visit on 04/28/2020. Patient stated she understood and would follow recommendations.

## 2020-04-28 ENCOUNTER — Telehealth: Payer: Self-pay

## 2020-04-28 NOTE — Telephone Encounter (Signed)
Charlotte Cross as she wasn't in class today and missed her RD appointment. She had a doctors appointment that she had forgotten about, but will be here tomorrow.

## 2020-04-29 ENCOUNTER — Other Ambulatory Visit: Payer: Self-pay

## 2020-04-29 ENCOUNTER — Encounter: Payer: Medicare Other | Admitting: *Deleted

## 2020-04-29 DIAGNOSIS — D869 Sarcoidosis, unspecified: Secondary | ICD-10-CM

## 2020-04-29 DIAGNOSIS — D860 Sarcoidosis of lung: Secondary | ICD-10-CM | POA: Diagnosis not present

## 2020-04-29 LAB — GLUCOSE, CAPILLARY
Glucose-Capillary: 190 mg/dL — ABNORMAL HIGH (ref 70–99)
Glucose-Capillary: 147 mg/dL — ABNORMAL HIGH (ref 70–99)

## 2020-04-29 NOTE — Progress Notes (Signed)
Daily Session Note  Patient Details  Name: Charlotte Cross MRN: [id number] Date of Birth: 1966-01-11 Referring Provider:   Flowsheet Row Pulmonary Rehab from 04/13/2020 in [facility] Cardiac and Pulmonary Rehab  Referring Provider Vonita Moss MD      Encounter Date: 04/29/2020  Check In:  Session Check In - 04/29/20 1404      Check-In   Supervising physician immediately available to respond to emergencies See telemetry face sheet for immediately available ER MD    Location [facility]-Cardiac & Pulmonary Rehab    Staff Present Renita Papa, RN BSN;Joseph [address] [place], [place], RCEP, CCRP, CCET    Virtual Visit No    Medication changes reported     No    Fall or balance concerns reported    No    Warm-up and Cool-down Performed on first and last piece of equipment    Resistance Training Performed Yes    VAD Patient? No    PAD/SET Patient? No      Pain Assessment   Currently in Pain? No/denies              Social History   Tobacco Use  Smoking Status Former Smoker  . Packs/day: 0.25  . Years: 6.00  . Pack years: 1.50  . Types: Cigarettes  . Quit date: 03/21/1987  . Years since quitting: 33.1  Smokeless Tobacco Never Used    Goals Met:  Independence with exercise equipment Exercise tolerated well No report of cardiac concerns or symptoms Strength training completed today  Goals Unmet:  Not Applicable  Comments: First full day of exercise!  Patient was oriented to gym and equipment including functions, settings, policies, and procedures.  Patient's individual exercise prescription and treatment plan were reviewed.  All starting workloads were established based on the results of the 6 minute walk test done at initial orientation visit.  The plan for exercise progression was also introduced and progression will be customized based on patient's performance and goals.     Dr. Emily Filbert is Medical Director for [place] and  [facility].

## 2020-05-03 NOTE — Unmapped (Unsigned)
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 Patient's condition was discussed with the patient in detail.  Patient was given opportunity to ask questions.     Ulcers noted between 3rd and 4th toes, right foot at PIPJ level, probes almost to capsule, granulofibrotic wound base noted, peri wound hyperkeratotic tissue noted, no purulent drainage noted, no localized signs of infection noted.     ***Right foot XR from 03/23/20 reviewed in detail with patient showing no radiographic findings of osteomyelitis.     Procedure: excisional debridement of the right foot wounds, 3rd toe lateral and 4th toe medial  At this time patient's wound was debrided due to the increase in devitalized tissue of epidermis/dermis, exudate, callus tissue, and other necrotic/fibrotic tissue impairing proper wound healing as reported in the Objective. All benefits, risks, and alternatives were explained in detail with the patient. A time out was taken prior to the procedure. The area was prepped in standard clinic fashion. An excisional Sharp, surgical debridement was performed to fully debride and remove all central devitalized and necrotic tissue including subcutaneous tissue. The debridement occurred to the level of subcutaneous tissue until  bleeding granulation tissue was observed in 100% of the wound bed.  This was achieved with the use of a curette, forceps and scalpel. Blood loss was minimal.  Hemostasis was achieved with pressure and the post-debridement measurements are documented in the chart. Patient tolerated procedure well at this time.    Dressings: Betadine, gauze, and tape applied. Patient should continue to change dressings daily and to avoid soaking foot.     ***Discussed with patient beginning to wash web spaces daily with antibacterial soap, making sure to dry thoroughly before applying dressings and separating her 3rd and 4th toes. She should avoid applying ketoconazole to her wounds, as this medication macerates the area.     Activity: WBAT in [facility] shoe to right foot. .    Patient to check feet daily for ulcers.     Continue application of ketoconazole 2% BID to affected nails and areas of feet.     RTC in***    All questions were answered and no guarantees were given or implied. Patient expressed understanding.     History of Present Illness: Patient is a 55 y.o.  female with a history of T2DM, HTN, COPD, hypoxia, sarcoidosis, severe persistent asthma dependent on systemic steroids who returns to the clinic regarding right foot ulcers between 3rd and 4th toes at PIPJ level.       Patient denies any other complaints. Patient denies fever, nausea, vomiting, chills, SOB, chest pain      A1c was 10.1% on 12/12/2019    Past Medical History:   Diagnosis Date   ??? 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 Breast injury     lung surg on left incision under breast sept 2016   ??? Caregiver burden     parents    ???  Hepatitis C antibody test positive 2016    repeatedly negative HCV RNA, indicating clearance of infection w/o treatment   ??? Hyperlipidemia    ??? Hypertension    ??? 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 On home O2 2008    per Dr Mal Amabile note from 02/21/2017   ??? Pulmonary aspergillosis (CMS-HCC) 2016    A.fumigatus in 2016, prior to LUL lobectomy. A.niger from sputum 08/2016-09/2016.   ??? 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 Sarcoidosis 1995   ??? Type 2 diabetes mellitus with hyperglycemia (CMS-HCC) 07/27/2014   ??? Visual impairment     glasses     No Known Allergies     ROS:   Constitutional:  Denies fever or chills   Eyes:  Denies change in visual acuity   HENT:  Denies nasal congestion or sore throat   Respiratory:  Denies cough or shortness of breath   Cardiovascular:  Denies chest pain or edema   GI:  Denies abdominal pain, nausea, vomiting, bloody stools or diarrhea   GU:  Denies dysuria   Musculoskeletal:  Denies back pain or joint pain   Integument:  Denies rash   Neurologic:  Denies headache, focal weakness or sensory changes   Endocrine:  Denies polyuria or polydipsia   Lymphatic:  Denies swollen glands   Psychiatric:  Denies depression or anxiety     Test Results  Imaging: Pending    Physical:   General Appearance: well nourished, Aox3    Vascular: Stable and unchanged.    Skin: Ulcers noted between 3rd and 4th toes, right foot at PIPJ level, probes almost to capsule, granulofibrotic wound base noted, peri wound hyperkeratotic tissue noted, no purulent drainage noted, no localized signs of infection noted. Medial 4th toe wound measures 1.5 x 0.5 x 0.4 cm. Lateral 3rd toe wound measures 1 x 1 x 0.4 cm.     Dry, flaky skin noted to plantar aspect of bilateral feet.   Nails of are elongated, discolored, distrophic with sunungual debris x 10.    Neurological: Protective sensation is diminished bilaterally. Sharp/dull sensation is diminished bilaterally.     MSK: Muscle strength exam deferred due to post-op.    ***pic      I personally spent 20 minutes face-to-face and non-face-to-face in the care of this patient, which includes all pre, intra, and post visit time on the date of service.      Scribe's Attestation: Otho Perl, DPM obtained and performed the history, physical exam and medical decision making elements that were  entered into the chart. Documentation assistance was provided by me personally, a scribe. Signed by Azalee Course, Scribe, on May 04, 2020 at 8:03 AM.     {*** NOTE TO PROVIDER: PLEASE ADD ATTESTATION NOTING YOU AGREE WITH SCRIBE DOCUMENTATION. ***}

## 2020-05-05 ENCOUNTER — Other Ambulatory Visit: Payer: Self-pay

## 2020-05-05 DIAGNOSIS — D860 Sarcoidosis of lung: Secondary | ICD-10-CM | POA: Diagnosis not present

## 2020-05-05 DIAGNOSIS — D869 Sarcoidosis, unspecified: Secondary | ICD-10-CM

## 2020-05-05 LAB — GLUCOSE, CAPILLARY
Glucose-Capillary: 206 mg/dL — ABNORMAL HIGH (ref 70–99)
Glucose-Capillary: 212 mg/dL — ABNORMAL HIGH (ref 70–99)

## 2020-05-05 NOTE — Progress Notes (Signed)
Daily Session Note  Patient Details  Name: Charlotte Cross MRN: [id number] Date of Birth: 01/17/1966 Referring Provider:   Flowsheet Row Pulmonary Rehab from 04/13/2020 in [facility] Cardiac and Pulmonary Rehab  Referring Provider Fried, Julie MD      Encounter Date: 05/05/2020  Check In:  Session Check In - 05/05/20 1402      Check-In   Supervising physician immediately available to respond to emergencies See telemetry face sheet for immediately available ER MD    Location [facility]-Cardiac & Pulmonary Rehab    Staff Present Kelly Bollinger, MPA, RN;Joseph Hood RCP,RRT,BSRT;Kara Langdon, MS Exercise Physiologist    Virtual Visit No    Medication changes reported     No    Fall or balance concerns reported    No    Warm-up and Cool-down Performed on first and last piece of equipment    Resistance Training Performed Yes    VAD Patient? No    PAD/SET Patient? No      Pain Assessment   Currently in Pain? No/denies              Social History   Tobacco Use  Smoking Status Former Smoker  . Packs/day: 0.25  . Years: 6.00  . Pack years: 1.50  . Types: Cigarettes  . Quit date: 03/21/1987  . Years since quitting: 33.1  Smokeless Tobacco Never Used    Goals Met:  Independence with exercise equipment Exercise tolerated well No report of cardiac concerns or symptoms Strength training completed today  Goals Unmet:  Not Applicable  Comments: Pt able to follow exercise prescription today without complaint.  Will continue to monitor for progression.    Dr. Mark Miller is Medical Director for [facility] and [facility]. 

## 2020-05-12 ENCOUNTER — Encounter: Payer: Self-pay | Admitting: *Deleted

## 2020-05-12 ENCOUNTER — Other Ambulatory Visit: Payer: Self-pay

## 2020-05-12 DIAGNOSIS — D860 Sarcoidosis of lung: Secondary | ICD-10-CM | POA: Diagnosis not present

## 2020-05-12 DIAGNOSIS — D869 Sarcoidosis, unspecified: Secondary | ICD-10-CM

## 2020-05-12 LAB — GLUCOSE, CAPILLARY
Glucose-Capillary: 177 mg/dL — ABNORMAL HIGH (ref 70–99)
Glucose-Capillary: 161 mg/dL — ABNORMAL HIGH (ref 70–99)

## 2020-05-12 NOTE — Progress Notes (Signed)
Daily Session Note  Patient Details  Name: Charlotte Cross MRN: [id number] Date of Birth: 10/01/1965 Referring Provider:   Flowsheet Row Pulmonary Rehab from 04/13/2020 in [facility] Pulmonary Rehab  Referring Provider Vonita Moss MD      Encounter Date: 05/12/2020  Check In:  Session Check In - 05/12/20 1413      Check-In   Supervising physician immediately available to respond to emergencies See telemetry face sheet for immediately available ER MD    Location [facility]-Cardiac & Pulmonary Rehab    Staff Present Birdie Sons, MPA, RN;Joseph Lou Miner, [place] Exercise Physiologist    Virtual Visit No    Medication changes reported     No    Fall or balance concerns reported    No    Warm-up and Cool-down Performed on first and last piece of equipment    Resistance Training Performed Yes    VAD Patient? No    PAD/SET Patient? No      Pain Assessment   Currently in Pain? No/denies              Social History   Tobacco Use  Smoking Status Former Smoker  . Packs/day: 0.25  . Years: 6.00  . Pack years: 1.50  . Types: Cigarettes  . Quit date: 03/21/1987  . Years since quitting: 33.1  Smokeless Tobacco Never Used    Goals Met:  Independence with exercise equipment Exercise tolerated well No report of cardiac concerns or symptoms Strength training completed today  Goals Unmet:  Not Applicable  Comments: Pt able to follow exercise prescription today without complaint.  Will continue to monitor for progression.    Dr. Emily Filbert is Medical Director for [place] and [facility].

## 2020-05-12 NOTE — Progress Notes (Signed)
Pulmonary Individual Treatment Plan  Patient Details  Name: Charlotte Cross MRN: [id number] Date of Birth: Jan 30, 1966 Referring Provider:   Flowsheet Row Pulmonary Rehab from 04/13/2020 in [facility] Cardiac and Pulmonary Rehab  Referring Provider Vonita Moss MD      Initial Encounter Date:  Flowsheet Row Pulmonary Rehab from 04/13/2020 in [facility] Cardiac and Pulmonary Rehab  Date 04/13/20      Visit Diagnosis: Sarcoidosis  Patient's Home Medications on Admission:  Current Outpatient Medications:  .  Accu-Chek FastClix Lancets MISC, USE TO CHECK GLUCOSE 4 TIMES DAILY BEFORE MEAL(S) AND NIGHTLY, Disp: , Rfl:  .  acetaminophen (TYLENOL) 325 MG tablet, Take by mouth., Disp: , Rfl:  .  albuterol (PROVENTIL HFA;VENTOLIN HFA) 108 (90 BASE) MCG/ACT inhaler, Inhale 2 puffs into the lungs every 6 (six) hours as needed for wheezing or shortness of breath., Disp: , Rfl:  .  albuterol (PROVENTIL) (2.5 MG/3ML) 0.083% nebulizer solution, Inhale 2.5 mg into the lungs every 6 (six) hours as needed for wheezing or shortness of breath. , Disp: , Rfl:  .  albuterol (VENTOLIN HFA) 108 (90 Base) MCG/ACT inhaler, Inhale into the lungs. (Patient not taking: Reported on 03/30/2020), Disp: , Rfl:  .  amLODipine (NORVASC) 5 MG tablet, Take by mouth., Disp: , Rfl:  .  aspirin EC 325 MG tablet, Take 325 mg by mouth daily., Disp: , Rfl:  .  Blood Glucose Monitoring Suppl (ACCU-CHEK GUIDE) w/Device KIT, 1 Device by Other route Four (4) times a day (before meals and nightly). AS DIRECTED, Disp: , Rfl:  .  budesonide (PULMICORT) 0.5 MG/2ML nebulizer solution, Inhale 2 mLs (0.5 mg total) into the lungs every 12 (twelve) hours., Disp: 120 mL, Rfl: 0 .  budesonide-formoterol (SYMBICORT) 160-4.5 MCG/ACT inhaler, Inhale into the lungs., Disp: , Rfl:  .  cyclobenzaprine (FLEXERIL) 10 MG tablet, Take 10 mg by mouth 3 (three) times daily as needed for muscle spasms. , Disp: , Rfl:  .  feeding supplement, ENSURE ENLIVE, (ENSURE  ENLIVE) LIQD, Take 237 mLs by mouth daily. (Patient not taking: Reported on 03/30/2020), Disp: 237 mL, Rfl: 12 .  furosemide (LASIX) 20 MG tablet, Take by mouth., Disp: , Rfl:  .  gabapentin (NEURONTIN) 300 MG capsule, Take 300 mg by mouth 3 (three) times daily., Disp: , Rfl:  .  glucose blood (PRECISION QID TEST) test strip, by Other route Three (3) times a day. ACCU-CHEK Guide meter, Disp: , Rfl:  .  hydrochlorothiazide (HYDRODIURIL) 25 MG tablet, Take 25 mg by mouth daily., Disp: , Rfl:  .  insulin aspart (NOVOLOG) 100 UNIT/ML injection, INJECT 1 TO 20 UNIT(S) SUBCUTANEOUSLY 4 TIMES DAILY BEFORE MEALS AND AT BEDTIME AS DIRECTED, Disp: , Rfl:  .  Insulin Glargine (BASAGLAR KWIKPEN) 100 UNIT/ML, 18 Units nightly, Disp: , Rfl:  .  ipratropium (ATROVENT) 0.02 % nebulizer solution, 2 (two) times daily, Disp: , Rfl:  .  ketoconazole (NIZORAL) 2 % cream, Apply topically., Disp: , Rfl:  .  levofloxacin (LEVAQUIN) 750 MG tablet, Take 1 tablet (750 mg total) by mouth daily. (Patient not taking: Reported on 03/30/2020), Disp: 5 tablet, Rfl: 0 .  lisinopril (PRINIVIL,ZESTRIL) 10 MG tablet, Take 10 mg by mouth daily.  (Patient not taking: Reported on 03/30/2020), Disp: , Rfl:  .  Mepolizumab (NUCALA) 100 MG/ML SOAJ, Take by mouth., Disp: , Rfl:  .  metFORMIN (GLUCOPHAGE) 1000 MG tablet, Take 1,000 mg by mouth 2 (two) times daily with a meal., Disp: , Rfl:  .  posaconazole (  NOXAFIL) 100 MG TBEC delayed-release tablet, Take by mouth., Disp: , Rfl:  .  pravastatin (PRAVACHOL) 40 MG tablet, Take 40 mg by mouth at bedtime., Disp: , Rfl:  .  sildenafil (REVATIO) 20 MG tablet, Take by mouth., Disp: , Rfl:   Past Medical History: Past Medical History:  Diagnosis Date  . 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 COPD (chronic obstructive pulmonary disease) ([place])   . Diabetes mellitus without complication ([place])   . Hypertension   . Sarcoidosis     Tobacco Use: Social History   Tobacco Use  Smoking Status Former Smoker  .  Packs/day: 0.25  . Years: 6.00  . Pack years: 1.50  . Types: Cigarettes  . Quit date: 03/21/1987  . Years since quitting: 33.1  Smokeless Tobacco Never Used    Labs: Recent Review Scientist, physiological    Labs for ITP Cardiac and Pulmonary Rehab Latest Ref Rng & Units 12/24/2014 08/26/2016   Hemoglobin A1c 4.8 - 5.6 % - 12.3(H)   HCO3 21.0 - 28.0 mEq/L 29.2(H) -       Pulmonary Assessment Scores:  Pulmonary Assessment Scores    Row Name 04/13/20 1556         ADL UCSD   ADL Phase Entry     SOB Score total 79     Rest 3     Walk 4     Stairs 5     Bath 3     Dress 3     Shop 4           CAT Score   CAT Score 29           mMRC Score   mMRC Score 4            UCSD: Self-administered rating of dyspnea associated with activities of daily living (ADLs) 6-point scale (0 = "not at all" to 5 = "maximal or unable to do because of breathlessness")  Scoring Scores range from 0 to 120.  Minimally important difference is 5 units  CAT: CAT can identify the health impairment of COPD patients and is better correlated with disease progression.  CAT has a scoring range of zero to 40. The CAT score is classified into four groups of low (less than 10), medium (10 - 20), high (21-30) and very high (31-40) based on the impact level of disease on health status. A CAT score over 10 suggests significant symptoms.  A worsening CAT score could be explained by an exacerbation, poor medication adherence, poor inhaler technique, or progression of COPD or comorbid conditions.  CAT MCID is 2 points  mMRC: mMRC (Modified Medical Research Council) Dyspnea Scale is used to assess the degree of baseline functional disability in patients of respiratory disease due to dyspnea. No minimal important difference is established. A decrease in score of 1 point or greater is considered a positive change.   Pulmonary Function Assessment:  Pulmonary Function Assessment - 04/13/20 1556      Breath   Shortness of  Breath Yes;Fear of Shortness of Breath;Limiting activity;Panic with Shortness of Breath           Exercise Target Goals: Exercise Program Goal: Individual exercise prescription set using results from initial 6 min walk test and THRR while considering  patient's activity barriers and safety.   Exercise Prescription Goal: Initial exercise prescription builds to 30-45 minutes a day of aerobic activity, 2-3 days per week.  Home exercise guidelines will be given to patient during program as part  of exercise prescription that the participant will acknowledge.  Education: Aerobic Exercise: - Group verbal and visual presentation on the components of exercise prescription. Introduces F.I.T.T principle from ACSM for exercise prescriptions.  Reviews F.I.T.T. principles of aerobic exercise including progression. Written material given at graduation.   Education: Resistance Exercise: - Group verbal and visual presentation on the components of exercise prescription. Introduces F.I.T.T principle from ACSM for exercise prescriptions  Reviews F.I.T.T. principles of resistance exercise including progression. Written material given at graduation.    Education: Exercise & Equipment Safety: - Individual verbal instruction and demonstration of equipment use and safety with use of the equipment. Flowsheet Row Pulmonary Rehab from 04/13/2020 in [facility] Cardiac and Pulmonary Rehab  Date 03/30/20  Educator [facility]  Instruction Review Code 1- Verbalizes Understanding      Education: Exercise Physiology & General Exercise Guidelines: - Group verbal and written instruction with models to review the exercise physiology of the cardiovascular system and associated critical values. Provides general exercise guidelines with specific guidelines to those with heart or lung disease.    Education: Flexibility, Balance, Mind/Body Relaxation: - Group verbal and visual presentation with interactive activity on the components of  exercise prescription. Introduces F.I.T.T principle from ACSM for exercise prescriptions. Reviews F.I.T.T. principles of flexibility and balance exercise training including progression. Also discusses the mind body connection.  Reviews various relaxation techniques to help reduce and manage stress (i.e. Deep breathing, progressive muscle relaxation, and visualization). Balance handout provided to take home. Written material given at graduation.   Activity Barriers & Risk Stratification:  Activity Barriers & Cardiac Risk Stratification - 04/13/20 1550      Activity Barriers & Cardiac Risk Stratification   Activity Barriers Deconditioning;Muscular Weakness;Shortness of Breath;Assistive Device;Other (comment)    Comments uses scooter now due to SOB and fatigue           6 Minute Walk:  6 Minute Walk    Row Name 04/13/20 1545         6 Minute Walk   Phase Initial     Distance 270 feet     Walk Time 2 minutes     # of Rest Breaks 2  2:47, 1:23     MPH 1.53     METS 2.06     RPE 20     Perceived Dyspnea  4     VO2 Peak 7.2     Symptoms Yes (comment)     Comments SOB, fatigue, chest tightness from breathing     Resting HR 94 bpm  initially 77% on low tank     Resting BP 132/78     Resting Oxygen Saturation  99 %     Exercise Oxygen Saturation  during 6 min walk 84 %     Max Ex. HR 125 bpm     Max Ex. BP 152/84     2 Minute Post BP 132/76           Interval HR   1 Minute HR 112     2 Minute HR 123     3 Minute HR 118     4 Minute HR 121     5 Minute HR 122     6 Minute HR 125     2 Minute Post HR 114     Interval Heart Rate? Yes           Interval Oxygen   Interval Oxygen? Yes     Baseline Oxygen Saturation % 99 %  1 Minute Oxygen Saturation % 98 %     1 Minute Liters of Oxygen 6 L  continuous e-tank     2 Minute Oxygen Saturation % 86 %  seated     2 Minute Liters of Oxygen 6 L     3 Minute Oxygen Saturation % 88 %  seated     3 Minute Liters of Oxygen 6 L      4 Minute Oxygen Saturation % 93 %     4 Minute Liters of Oxygen 6 L     5 Minute Oxygen Saturation % 90 %  seated     5 Minute Liters of Oxygen 6 L     6 Minute Oxygen Saturation % 87 %  low at 84% at 26mn     6 Minute Liters of Oxygen 6 L     2 Minute Post Oxygen Saturation % 91 %     2 Minute Post Liters of Oxygen 6 L           Oxygen Initial Assessment:  Oxygen Initial Assessment - 04/13/20 1555      Home Oxygen   Home Oxygen Device Home Concentrator;Portable Concentrator;E-Tanks    Sleep Oxygen Prescription Continuous    Liters per minute 5    Home Exercise Oxygen Prescription Continuous    Liters per minute 5    Home Resting Oxygen Prescription Continuous    Liters per minute 5    Compliance with Home Oxygen Use Yes      Initial 6 min Walk   Oxygen Used Continuous;E-Tanks    Liters per minute 6      Program Oxygen Prescription   Program Oxygen Prescription Continuous;E-Tanks    Liters per minute 6      Intervention   Short Term Goals To learn and exhibit compliance with exercise, home and travel O2 prescription;To learn and understand importance of monitoring SPO2 with pulse oximeter and demonstrate accurate use of the pulse oximeter.;To learn and understand importance of maintaining oxygen saturations>88%;To learn and demonstrate proper pursed lip breathing techniques or other breathing techniques.;To learn and demonstrate proper use of respiratory medications    Long  Term Goals Exhibits compliance with exercise, home and travel O2 prescription;Verbalizes importance of monitoring SPO2 with pulse oximeter and return demonstration;Maintenance of O2 saturations>88%;Exhibits proper breathing techniques, such as pursed lip breathing or other method taught during program session;Compliance with respiratory medication;Demonstrates proper use of MDI's           Oxygen Re-Evaluation:  Oxygen Re-Evaluation    Row Name 04/29/20 1414             Program Oxygen  Prescription   Program Oxygen Prescription Continuous;E-Tanks       Liters per minute 6               Home Oxygen   Sleep Oxygen Prescription Continuous       Liters per minute 5       Home Exercise Oxygen Prescription Continuous       Liters per minute 5       Home Resting Oxygen Prescription Continuous       Liters per minute 5       Compliance with Home Oxygen Use Yes               Goals/Expected Outcomes   Short Term Goals To learn and exhibit compliance with exercise, home and travel O2 prescription;To learn and understand importance of monitoring SPO2 with pulse  oximeter and demonstrate accurate use of the pulse oximeter.;To learn and understand importance of maintaining oxygen saturations>88%;To learn and demonstrate proper pursed lip breathing techniques or other breathing techniques.;To learn and demonstrate proper use of respiratory medications       Long  Term Goals Exhibits compliance with exercise, home and travel O2 prescription;Verbalizes importance of monitoring SPO2 with pulse oximeter and return demonstration;Maintenance of O2 saturations>88%;Exhibits proper breathing techniques, such as pursed lip breathing or other method taught during program session;Compliance with respiratory medication;Demonstrates proper use of MDI's       Comments Reviewed PLB technique with pt.  Talked about how it works and it's importance in maintaining their exercise saturations.       Goals/Expected Outcomes Short: Become more profiecient at using PLB.   Long: Become independent at using PLB.              Oxygen Discharge (Final Oxygen Re-Evaluation):  Oxygen Re-Evaluation - 04/29/20 1414      Program Oxygen Prescription   Program Oxygen Prescription Continuous;E-Tanks    Liters per minute 6      Home Oxygen   Sleep Oxygen Prescription Continuous    Liters per minute 5    Home Exercise Oxygen Prescription Continuous    Liters per minute 5    Home Resting Oxygen Prescription  Continuous    Liters per minute 5    Compliance with Home Oxygen Use Yes      Goals/Expected Outcomes   Short Term Goals To learn and exhibit compliance with exercise, home and travel O2 prescription;To learn and understand importance of monitoring SPO2 with pulse oximeter and demonstrate accurate use of the pulse oximeter.;To learn and understand importance of maintaining oxygen saturations>88%;To learn and demonstrate proper pursed lip breathing techniques or other breathing techniques.;To learn and demonstrate proper use of respiratory medications    Long  Term Goals Exhibits compliance with exercise, home and travel O2 prescription;Verbalizes importance of monitoring SPO2 with pulse oximeter and return demonstration;Maintenance of O2 saturations>88%;Exhibits proper breathing techniques, such as pursed lip breathing or other method taught during program session;Compliance with respiratory medication;Demonstrates proper use of MDI's    Comments Reviewed PLB technique with pt.  Talked about how it works and it's importance in maintaining their exercise saturations.    Goals/Expected Outcomes Short: Become more profiecient at using PLB.   Long: Become independent at using PLB.           Initial Exercise Prescription:  Initial Exercise Prescription - 04/13/20 1500      Date of Initial Exercise RX and Referring Provider   Date 04/13/20    Referring Provider Vonita Moss MD      Oxygen   Oxygen Continuous    Liters 6      Recumbant Bike   Level 1    RPM 50    Watts 10    Minutes 15    METs 2      NuStep   Level 1    SPM 80    Minutes 15    METs 2      Arm Ergometer   Level 1    RPM 30    Minutes 15    METs 2      Biostep-RELP   Level 1    SPM 50    Minutes 15    METs 2      Prescription Details   Frequency (times per week) 3    Duration Progress to 30 minutes of continuous  aerobic without signs/symptoms of physical distress      Intensity   THRR 40-80% of Max  Heartrate 122-150    Ratings of Perceived Exertion 11-13    Perceived Dyspnea 0-4      Progression   Progression Continue to progress workloads to maintain intensity without signs/symptoms of physical distress.      Resistance Training   Training Prescription Yes    Weight 3 lb    Reps 10-15           Perform Capillary Blood Glucose checks as needed.  Exercise Prescription Changes:  Exercise Prescription Changes    Row Name 04/13/20 1500             Response to Exercise   Blood Pressure (Admit) 132/78       Blood Pressure (Exercise) 152/84       Blood Pressure (Exit) 112/60       Heart Rate (Admit) 94 bpm       Heart Rate (Exercise) 1255 bpm       Heart Rate (Exit) 104 bpm       Oxygen Saturation (Admit) 99 %       Oxygen Saturation (Exercise) 84 %       Oxygen Saturation (Exit) 97 %       Rating of Perceived Exertion (Exercise) 20       Perceived Dyspnea (Exercise) 4       Symptoms SOB, fatigue       Comments walk test results              Exercise Comments:   Exercise Goals and Review:  Exercise Goals    Row Name 04/13/20 1552             Exercise Goals   Increase Physical Activity Yes       Intervention Provide advice, education, support and counseling about physical activity/exercise needs.;Develop an individualized exercise prescription for aerobic and resistive training based on initial evaluation findings, risk stratification, comorbidities and participant's personal goals.       Expected Outcomes Short Term: Attend rehab on a regular basis to increase amount of physical activity.;Long Term: Add in home exercise to make exercise part of routine and to increase amount of physical activity.;Long Term: Exercising regularly at least 3-5 days a week.       Increase Strength and Stamina Yes       Intervention Provide advice, education, support and counseling about physical activity/exercise needs.;Develop an individualized exercise prescription for  aerobic and resistive training based on initial evaluation findings, risk stratification, comorbidities and participant's personal goals.       Expected Outcomes Short Term: Increase workloads from initial exercise prescription for resistance, speed, and METs.;Short Term: Perform resistance training exercises routinely during rehab and add in resistance training at home;Long Term: Improve cardiorespiratory fitness, muscular endurance and strength as measured by increased METs and functional capacity (6MWT)       Able to understand and use rate of perceived exertion (RPE) scale Yes       Intervention Provide education and explanation on how to use RPE scale       Expected Outcomes Short Term: Able to use RPE daily in rehab to express subjective intensity level;Long Term:  Able to use RPE to guide intensity level when exercising independently       Able to understand and use Dyspnea scale Yes       Intervention Provide education and explanation on how to use Dyspnea  scale       Expected Outcomes Short Term: Able to use Dyspnea scale daily in rehab to express subjective sense of shortness of breath during exertion;Long Term: Able to use Dyspnea scale to guide intensity level when exercising independently       Knowledge and understanding of Target Heart Rate Range (THRR) Yes       Intervention Provide education and explanation of THRR including how the numbers were predicted and where they are located for reference       Expected Outcomes Short Term: Able to state/look up THRR;Long Term: Able to use THRR to govern intensity when exercising independently;Short Term: Able to use daily as guideline for intensity in rehab       Able to check pulse independently Yes       Intervention Provide education and demonstration on how to check pulse in carotid and radial arteries.;Review the importance of being able to check your own pulse for safety during independent exercise       Expected Outcomes Short Term: Able  to explain why pulse checking is important during independent exercise;Long Term: Able to check pulse independently and accurately       Understanding of Exercise Prescription Yes       Intervention Provide education, explanation, and written materials on patient's individual exercise prescription       Expected Outcomes Short Term: Able to explain program exercise prescription;Long Term: Able to explain home exercise prescription to exercise independently              Exercise Goals Re-Evaluation :  Exercise Goals Re-Evaluation    [place] Name 04/29/20 1413             Exercise Goal Re-Evaluation   Exercise Goals Review Increase Physical Activity;Able to understand and use rate of perceived exertion (RPE) scale;Knowledge and understanding of Target Heart Rate Range (THRR);Understanding of Exercise Prescription;Increase Strength and Stamina;Able to understand and use Dyspnea scale;Able to check pulse independently       Comments Reviewed RPE and dyspnea scales, THR and program prescription with pt today.  Pt voiced understanding and was given a copy of goals to take home.       Expected Outcomes Short: Use RPE daily to regulate intensity. Long: Follow program prescription in THR.              Discharge Exercise Prescription (Final Exercise Prescription Changes):  Exercise Prescription Changes - 04/13/20 1500      Response to Exercise   Blood Pressure (Admit) 132/78    Blood Pressure (Exercise) 152/84    Blood Pressure (Exit) 112/60    Heart Rate (Admit) 94 bpm    Heart Rate (Exercise) 1255 bpm    Heart Rate (Exit) 104 bpm    Oxygen Saturation (Admit) 99 %    Oxygen Saturation (Exercise) 84 %    Oxygen Saturation (Exit) 97 %    Rating of Perceived Exertion (Exercise) 20    Perceived Dyspnea (Exercise) 4    Symptoms SOB, fatigue    Comments walk test results           Nutrition:  Target Goals: Understanding of nutrition guidelines, daily intake of sodium <1522m,  cholesterol <2058m calories 30% from fat and 7% or less from saturated fats, daily to have 5 or more servings of fruits and vegetables.  Education: All About Nutrition: -Group instruction provided by verbal, written material, interactive activities, discussions, models, and posters to present general guidelines for heart healthy nutrition including  fat, fiber, MyPlate, the role of sodium in heart healthy nutrition, utilization of the nutrition label, and utilization of this knowledge for meal planning. Follow up email sent as well. Written material given at graduation. Flowsheet Row Pulmonary Rehab from 04/13/2020 in [facility] Cardiac and Pulmonary Rehab  Education need identified 04/13/20      Biometrics:  Pre Biometrics - 04/13/20 1553      Pre Biometrics   Height 5' 11.1" (1.806 m)    Weight 232 lb 4.8 oz (105.4 kg)    BMI (Calculated) 32.31    Single Leg Stand --   did not do due to SOB/fatigue           Nutrition Therapy Plan and Nutrition Goals:   Nutrition Assessments:  MEDIFICTS Score Key:  ?70 Need to make dietary changes   40-70 Heart Healthy Diet  ? 40 Therapeutic Level Cholesterol Diet  Flowsheet Row Pulmonary Rehab from 04/13/2020 in [facility] Cardiac and Pulmonary Rehab  Picture Your Plate Total Score on Admission 54     Picture Your Plate Scores:  <37 Unhealthy dietary pattern with much room for improvement.  41-50 Dietary pattern unlikely to meet recommendations for good health and room for improvement.  51-60 More healthful dietary pattern, with some room for improvement.   >60 Healthy dietary pattern, although there may be some specific behaviors that could be improved.   Nutrition Goals Re-Evaluation:   Nutrition Goals Discharge (Final Nutrition Goals Re-Evaluation):   Psychosocial: Target Goals: Acknowledge presence or absence of significant depression and/or stress, maximize coping skills, provide positive support system. Participant is able to  verbalize types and ability to use techniques and skills needed for reducing stress and depression.   Education: Stress, Anxiety, and Depression - Group verbal and visual presentation to define topics covered.  Reviews how body is impacted by stress, anxiety, and depression.  Also discusses healthy ways to reduce stress and to treat/manage anxiety and depression.  Written material given at graduation.   Education: Sleep Hygiene -Provides group verbal and written instruction about how sleep can affect your health.  Define sleep hygiene, discuss sleep cycles and impact of sleep habits. Review good sleep hygiene tips.    Initial Review & Psychosocial Screening:  Initial Psych Review & Screening - 03/30/20 0945      Initial Review   Current issues with Current Stress Concerns    Source of Stress Concerns Chronic Illness      Family Dynamics   Good Support System? Yes    Comments Kennyth Lose can look to her parents and brother for support. She states that she has no stress concerns other than she cannot get around and is limited to doing daily activities.      Barriers   Psychosocial barriers to participate in program The patient should benefit from training in stress management and relaxation.      Screening Interventions   Interventions To provide support and resources with identified psychosocial needs;Provide feedback about the scores to participant;Encouraged to exercise    Expected Outcomes Short Term goal: Utilizing psychosocial counselor, staff and physician to assist with identification of specific Stressors or current issues interfering with healing process. Setting desired goal for each stressor or current issue identified.;Long Term Goal: Stressors or current issues are controlled or eliminated.;Short Term goal: Identification and review with participant of any Quality of Life or Depression concerns found by scoring the questionnaire.;Long Term goal: The participant improves quality of  Life and PHQ9 Scores as seen by post scores  and/or verbalization of changes           Quality of Life Scores:  Scores of 19 and below usually indicate a poorer quality of life in these areas.  A difference of  2-3 points is a clinically meaningful difference.  A difference of 2-3 points in the total score of the Quality of Life Index has been associated with significant improvement in overall quality of life, self-image, physical symptoms, and general health in studies assessing change in quality of life.  PHQ-9: Recent Review Flowsheet Data    Depression screen [facility] 2/9 04/13/2020 03/06/2017   Decreased Interest 3 0   Down, Depressed, Hopeless 1 0   PHQ - 2 Score 4 0   Altered sleeping 3 3   Tired, decreased energy 3 3   Change in appetite 0 2   Feeling bad or failure about yourself  0 0   Trouble concentrating 0 0   Moving slowly or fidgety/restless 0 0   Suicidal thoughts 0 0   PHQ-9 Score 10 8   Difficult doing work/chores Very difficult Somewhat difficult     Interpretation of Total Score  Total Score Depression Severity:  1-4 = Minimal depression, 5-9 = Mild depression, 10-14 = Moderate depression, 15-19 = Moderately severe depression, 20-27 = Severe depression   Psychosocial Evaluation and Intervention:  Psychosocial Evaluation - 03/30/20 0947      Psychosocial Evaluation & Interventions   Interventions Stress management education;Relaxation education;Encouraged to exercise with the program and follow exercise prescription    Comments Kennyth Lose can look to her parents and brother for support. She states that she has no stress concerns other than she cannot get around and is limited to doing daily activities.    Expected Outcomes Short: Exercise regularly to support mental health and notify staff of any changes. Long: maintain mental health and well being through teaching of rehab or prescribed medications independently.    Continue Psychosocial Services  Follow up required by  staff           Psychosocial Re-Evaluation:   Psychosocial Discharge (Final Psychosocial Re-Evaluation):   Education: Education Goals: Education classes will be provided on a weekly basis, covering required topics. Participant will state understanding/return demonstration of topics presented.  Learning Barriers/Preferences:  Learning Barriers/Preferences - 03/30/20 0944      Learning Barriers/Preferences   Learning Barriers None    Learning Preferences None           General Pulmonary Education Topics:  Infection Prevention: - Provides verbal and written material to individual with discussion of infection control including proper hand washing and proper equipment cleaning during exercise session. Flowsheet Row Pulmonary Rehab from 04/13/2020 in [facility] Cardiac and Pulmonary Rehab  Date 03/30/20  Educator [facility]  Instruction Review Code 1- Verbalizes Understanding      Falls Prevention: - Provides verbal and written material to individual with discussion of falls prevention and safety. Flowsheet Row Pulmonary Rehab from 04/13/2020 in [facility] Cardiac and Pulmonary Rehab  Date 03/30/20  Educator [facility]  Instruction Review Code 1- Verbalizes Understanding      Chronic Lung Disease Review: - Group verbal instruction with posters, models, PowerPoint presentations and videos,  to review new updates, new respiratory medications, new advancements in procedures and treatments. Providing information on websites and "800" numbers for continued self-education. Includes information about supplement oxygen, available portable oxygen systems, continuous and intermittent flow rates, oxygen safety, concentrators, and Medicare reimbursement for oxygen. Explanation of Pulmonary Drugs, including class, frequency, complications,  importance of spacers, rinsing mouth after steroid MDI's, and proper cleaning methods for nebulizers. Review of basic lung anatomy and physiology related to function, structure, and  complications of lung disease. Review of risk factors. Discussion about methods for diagnosing sleep apnea and types of masks and machines for OSA. Includes a review of the use of types of environmental controls: home humidity, furnaces, filters, dust mite/pet prevention, HEPA vacuums. Discussion about weather changes, air quality and the benefits of nasal washing. Instruction on Warning signs, infection symptoms, calling MD promptly, preventive modes, and value of vaccinations. Review of effective airway clearance, coughing and/or vibration techniques. Emphasizing that all should Create an Action Plan. Written material given at graduation. Flowsheet Row Pulmonary Rehab from 04/13/2020 in [facility] Cardiac and Pulmonary Rehab  Education need identified 04/13/20      AED/CPR: - Group verbal and written instruction with the use of models to demonstrate the basic use of the AED with the basic ABC's of resuscitation.    Anatomy and Cardiac Procedures: - Group verbal and visual presentation and models provide information about basic cardiac anatomy and function. Reviews the testing methods done to diagnose heart disease and the outcomes of the test results. Describes the treatment choices: Medical Management, Angioplasty, or Coronary Bypass Surgery for treating various heart conditions including Myocardial Infarction, Angina, Valve Disease, and Cardiac Arrhythmias.  Written material given at graduation.   Medication Safety: - Group verbal and visual instruction to review commonly prescribed medications for heart and lung disease. Reviews the medication, class of the drug, and side effects. Includes the steps to properly store meds and maintain the prescription regimen.  Written material given at graduation.   Other: -Provides group and verbal instruction on various topics (see comments)   Knowledge Questionnaire Score:  Knowledge Questionnaire Score - 04/13/20 1554      Knowledge Questionnaire Score    Pre Score 16/18 Education Focus: Nutriton and O2 safety            Core Components/Risk Factors/Patient Goals at Admission:  Personal Goals and Risk Factors at Admission - 04/13/20 1554      Core Components/Risk Factors/Patient Goals on Admission    Weight Management Yes;Weight Loss;Obesity    Intervention Weight Management: Develop a combined nutrition and exercise program designed to reach desired caloric intake, while maintaining appropriate intake of nutrient and fiber, sodium and fats, and appropriate energy expenditure required for the weight goal.;Weight Management: Provide education and appropriate resources to help participant work on and attain dietary goals.;Weight Management/Obesity: Establish reasonable short term and long term weight goals.;Obesity: Provide education and appropriate resources to help participant work on and attain dietary goals.    Admit Weight 232 lb 4.8 oz (105.4 kg)    Goal Weight: Short Term 225 lb (102.1 kg)    Goal Weight: Long Term 220 lb (99.8 kg)    Expected Outcomes Short Term: Continue to assess and modify interventions until short term weight is achieved;Long Term: Adherence to nutrition and physical activity/exercise program aimed toward attainment of established weight goal;Weight Loss: Understanding of general recommendations for a balanced deficit meal plan, which promotes 1-2 lb weight loss per week and includes a negative energy balance of [phone] kcal/d;Understanding recommendations for meals to include 15-35% energy as protein, 25-35% energy from fat, 35-60% energy from carbohydrates, less than 280m of dietary cholesterol, 20-35 gm of total fiber daily;Understanding of distribution of calorie intake throughout the day with the consumption of 4-5 meals/snacks    Improve shortness of breath with  ADL's Yes    Intervention Provide education, individualized exercise plan and daily activity instruction to help decrease symptoms of SOB with activities  of daily living.    Expected Outcomes Short Term: Improve cardiorespiratory fitness to achieve a reduction of symptoms when performing ADLs;Long Term: Be able to perform more ADLs without symptoms or delay the onset of symptoms    Diabetes Yes    Intervention Provide education about signs/symptoms and action to take for hypo/hyperglycemia.;Provide education about proper nutrition, including hydration, and aerobic/resistive exercise prescription along with prescribed medications to achieve blood glucose in normal ranges: Fasting glucose 65-99 mg/dL    Expected Outcomes Short Term: Participant verbalizes understanding of the signs/symptoms and immediate care of hyper/hypoglycemia, proper foot care and importance of medication, aerobic/resistive exercise and nutrition plan for blood glucose control.;Long Term: Attainment of HbA1C < 7%.    Hypertension Yes    Intervention Provide education on lifestyle modifcations including regular physical activity/exercise, weight management, moderate sodium restriction and increased consumption of fresh fruit, vegetables, and low fat dairy, alcohol moderation, and smoking cessation.;Monitor prescription use compliance.    Expected Outcomes Long Term: Maintenance of blood pressure at goal levels.;Short Term: Continued assessment and intervention until BP is < 140/52m HG in hypertensive participants. < 130/867mHG in hypertensive participants with diabetes, heart failure or chronic kidney disease.    Lipids Yes    Intervention Provide education and support for participant on nutrition & aerobic/resistive exercise along with prescribed medications to achieve LDL <7085mHDL >30m54m  Expected Outcomes Short Term: Participant states understanding of desired cholesterol values and is compliant with medications prescribed. Participant is following exercise prescription and nutrition guidelines.;Long Term: Cholesterol controlled with medications as prescribed, with individualized  exercise RX and with personalized nutrition plan. Value goals: LDL < 70mg65mL > 40 mg.           Education:Diabetes - Individual verbal and written instruction to review signs/symptoms of diabetes, desired ranges of glucose level fasting, after meals and with exercise. Acknowledge that pre and post exercise glucose checks will be done for 3 sessions at entry of program. Flowsheet [facility]  Date 04/13/20  Educator [facility] Review Code 1- Verbalizes Understanding      Know Your Numbers and Heart Failure: - Group verbal and visual instruction to discuss disease risk factors for cardiac and pulmonary disease and treatment options.  Reviews associated critical values for Overweight/Obesity, Hypertension, Cholesterol, and Diabetes.  Discusses basics of heart failure: signs/symptoms and treatments.  Introduces Heart Failure Zone chart for action plan for heart failure.  Written material given at graduation.   Core Components/Risk Factors/Patient Goals Review:    Core Components/Risk Factors/Patient Goals at Discharge (Final Review):    ITP Comments:  ITP Comments    Row Name 03/30/20 0941 04/13/20 1544 04/14/20 0937 04/29/20 1411 05/12/20 0615   ITP Comments Virtual Visit completed. Patient informed on EP and RD appointment and 6 Minute walk test. Patient also informed of patient health questionnaires on My Chart. Patient Verbalizes understanding. Visit diagnosis can be found in CHL10/29/2021. Completed 6MWT and gym orientation. Initial ITP created and sent for review to Dr. Mark Emily Filbertical Director. 30 Day review completed. Medical Director ITP review done, changes made as directed, and signed approval by Medical Director.   New to program First full day of exercise!  Patient was oriented to gym and equipment including functions, settings, policies, and procedures.  Patient's  individual exercise prescription and  treatment plan were reviewed.  All starting workloads were established based on the results of the 6 minute walk test done at initial orientation visit.  The plan for exercise progression was also introduced and progression will be customized based on patient's performance and goals. 30 Day review completed. Medical Director ITP review done, changes made as directed, and signed approval by Medical Director.          Comments:

## 2020-05-13 ENCOUNTER — Encounter: Payer: Medicare Other | Admitting: *Deleted

## 2020-05-13 ENCOUNTER — Other Ambulatory Visit: Payer: Self-pay

## 2020-05-13 DIAGNOSIS — D860 Sarcoidosis of lung: Secondary | ICD-10-CM | POA: Diagnosis not present

## 2020-05-13 DIAGNOSIS — D869 Sarcoidosis, unspecified: Secondary | ICD-10-CM

## 2020-05-13 NOTE — Unmapped (Signed)
Last seen 01/19/20 and note said to F/U in 2 months for A1C.  Left message for patient to call.

## 2020-05-13 NOTE — Unmapped (Signed)
Patient called in stating she wants to have the repeat labwork done that she had discussed with provider at previous appointment. I don't see orders in there. Please advise,

## 2020-05-13 NOTE — Progress Notes (Signed)
Daily Session Note  Patient Details  Name: Charlotte Cross MRN: [id number] Date of Birth: [birth year] Referring Provider:   Flowsheet Row Pulmonary Rehab from 04/13/2020 in [facility] Cardiac and Pulmonary Rehab  Referring Provider Vonita Moss MD      Encounter Date: 05/13/2020  Check In:  Session Check In - 05/13/20 1405      Check-In   Supervising physician immediately available to respond to emergencies See telemetry face sheet for immediately available ER MD    Location [facility]-Cardiac & Pulmonary Rehab    Staff Present Renita Papa, RN BSN;Joseph [address] [place], [place], RCEP, CCRP, CCET    Virtual Visit No    Medication changes reported     No    Fall or balance concerns reported    No    Warm-up and Cool-down Performed on first and last piece of equipment    Resistance Training Performed Yes    VAD Patient? No    PAD/SET Patient? No      Pain Assessment   Currently in Pain? No/denies              Social History   Tobacco Use  Smoking Status Former Smoker  . Packs/day: 0.25  . Years: 6.00  . Pack years: 1.50  . Types: Cigarettes  . Quit date: 03/21/1987  . Years since quitting: 33.1  Smokeless Tobacco Never Used    Goals Met:  Independence with exercise equipment Exercise tolerated well No report of cardiac concerns or symptoms Strength training completed today  Goals Unmet:  Not Applicable  Comments: Pt able to follow exercise prescription today without complaint.  Will continue to monitor for progression. Reviewed home exercise with pt today.  Pt plans to use bike/arm machine at home for exercise.  Reviewed THR, pulse, RPE, sign and symptoms, pulse oximetery and when to call 911 or MD.  Also discussed weather considerations and indoor options.  Pt voiced understanding.    Dr. Emily Filbert is Medical Director for [facility].

## 2020-05-14 NOTE — Unmapped (Signed)
[facility] Clinical Assessment & Refill Coordination Note    Karen Barber, DOB: 1965-10-28  Phone: [phone] (home) [phone] (work)    All above HIPAA information was verified with patient.     Was a Nurse, learning disability used for this call? No    Specialty Medication(s):   CF/Pulmonary: -Nucala 100mg /ml     Current Outpatient Medications   Medication Sig Dispense Refill   ??? ACCU-CHEK FASTCLIX LANCET DRUM Misc USE TO CHECK GLUCOSE 4 TIMES DAILY BEFORE MEAL(S) AND NIGHTLY     ??? ACCU-CHEK GUIDE GLUCOSE METER Misc 1 Device by Other route Four (4) times a day (before meals and nightly). AS DIRECTED 1 kit 0   ??? acetaminophen (TYLENOL) 325 MG tablet Take 2 tablets (650 mg total) by mouth every four (4) hours as needed. 30 tablet 1   ??? albuterol 2.5 mg /3 mL (0.083 %) nebulizer solution Inhale 3 mL (2.5 mg total) by nebulization every six (6) hours as needed for wheezing or shortness of breath (airway clearance/cough). 120 mL 0   ??? albuterol HFA 90 mcg/actuation inhaler Inhale 2 puffs every four (4) hours as needed for wheezing or shortness of breath. 18 g 6   ??? amLODIPine (NORVASC) 5 MG tablet Take 5 mg by mouth daily. Added per patient     ??? blood sugar diagnostic Strp by Other route Three (3) times a day. ACCU-CHEK Guide meter 200 strip 2   ??? budesonide-formoteroL (SYMBICORT) 160-4.5 mcg/actuation inhaler Inhale 2 puffs Two (2) times a day. 10.2 g 3   ??? cyclobenzaprine (FLEXERIL) 10 MG tablet Take 1 tablet (10 mg total) by mouth Three (3) times a day as needed. 90 tablet 0   ??? empty container Misc USE AS DIRECTED 1 each 2   ??? furosemide (LASIX) 20 MG tablet Take 20 mg by mouth daily as needed.     ??? gabapentin (NEURONTIN) 300 MG capsule Take 1 capsule (300 mg total) by mouth Three (3) times a day. 270 capsule 1   ??? hydroCHLOROthiazide (HYDRODIURIL) 25 MG tablet Take 1 tablet by mouth once daily 90 tablet 0   ??? insulin ASPART (NOVOLOG U-100 INSULIN ASPART) 100 unit/mL injection Take 1-20 units SQ QID AC and HS as directed 80 mL 1   ??? insulin glargine (BASAGLAR, LANTUS) 100 unit/mL (3 mL) injection pen Inject 0.18 mL (18 Units total) under the skin daily. 18 units (Patient taking differently: Inject 24 Units under the skin daily. 18 units) 18 mL 1   ??? ipratropium (ATROVENT) 0.02 % nebulizer solution Inhale 2.5 mL (500 mcg total) by nebulization Four (4) times a day. (Patient taking differently: Inhale 500 mcg by nebulization Two (2) times a day. ) 900 mL 1   ??? ketoconazole (NIZORAL) 2 % cream Apply 1 application topically Two (2) times a day. 60 g 5   ??? MEDICAL SUPPLY ITEM Accucheck Glide Glocumeter for home glucose checks. Check blood glucose ACHS. 1 Package 0   ??? mepolizumab (NUCALA) 100 mg/mL AtIn Inject the contents of 1 syringe (100 mg) under the skin every twenty-eight (28) days. 1 mL 12   ??? metFORMIN (GLUCOPHAGE) 1000 MG tablet Take 1 tablet (1,000 mg total) by mouth 2 (two) times a day with meals. 180 tablet 1   ??? miscellaneous medical supply Misc Nebulizer machine and tubing. Patients machine is 57 years old. 1 each 0   ??? OXYGEN-AIR DELIVERY SYSTEMS MISC Dose: 2-3 LPM     ??? posaconazole (NOXAFIL) 100 mg  delayed released tablet Take 2 tablets (200 mg total) by mouth daily. 60 tablet 2   ??? pravastatin (PRAVACHOL) 40 MG tablet Take 1 tablet (40 mg total) by mouth daily with evening meal. 90 tablet 1   ??? predniSONE (DELTASONE) 5 MG tablet Take 5 mg by mouth daily.     ??? sildenafiL, pulm.hypertension, (REVATIO) 20 mg tablet Take 1 tablet (20 mg total) by mouth Three (3) times a day. 90 tablet 11     No current facility-administered medications for this visit.        Changes to medications: Karen Barber reports starting the following medications: Tyvaso by [facility]    No Known Allergies    Changes to allergies: No    SPECIALTY MEDICATION ADHERENCE     Nucala 100 mg/ml: 0 days of medicine on hand       Medication Adherence    Patient reported X missed doses in the last month: 0  Specialty Medication: Nucala 100mg /ml  Patient is on additional specialty medications: No  Informant: patient          Specialty medication(s) dose(s) confirmed: Regimen is correct and unchanged.     Are there any concerns with adherence? No    Adherence counseling provided? Not needed    CLINICAL MANAGEMENT AND INTERVENTION      Clinical Benefit Assessment:    Do you feel the medicine is effective or helping your condition? Yes    Clinical Benefit counseling provided? Progress note from 10/29 shows evidence of clinical benefit.  Also sees [facility]    Adverse Effects Assessment:    Are you experiencing any side effects? No    Are you experiencing difficulty administering your medicine? No    Quality of Life Assessment:    How many days over the past month did your severe persistent asthma  keep you from your normal activities? For example, brushing your teeth or getting up in the morning. 0    Have you discussed this with your provider? Yes    Therapy Appropriateness:    Is therapy appropriate? Yes, therapy is appropriate and should be continued    DISEASE/MEDICATION-SPECIFIC INFORMATION      For patients on injectable medications: Patient currently has 0 doses left.  Next injection is scheduled for 2/28.    PATIENT SPECIFIC NEEDS     - Does the patient have any physical, cognitive, or cultural barriers? No    - Is the patient high risk? No    - Does the patient require a Care Management Plan? No     - Does the patient require physician intervention or other additional services (i.e. nutrition, smoking cessation, social work)? No      SHIPPING     Specialty Medication(s) to be Shipped:   CF/Pulmonary: -Nucala 100mg /ml    Other medication(s) to be shipped: No additional medications requested for fill at this time     Changes to insurance: No    Delivery Scheduled: Yes, Expected medication delivery date: 2/28.     Medication will be delivered via Same Day Courier to the confirmed prescription address in [facility].    The patient will receive a drug information handout for each medication shipped and additional FDA Medication Guides as required.  Verified that patient has previously received a Conservation officer, historic buildings.    All of the patient's questions and concerns have been addressed.    Julianne Rice   [facility] Specialty Pharmacist

## 2020-05-17 MED FILL — NUCALA 100 MG/ML SUBCUTANEOUS AUTO-INJECTOR: ORAL | 28 days supply | Qty: 1 | Fill #10

## 2020-05-18 NOTE — Unmapped (Signed)
Yes, she is due for HGB A1c. I have reviewed my last clinic note from 01/19/20 and there is no mention of additional lab recommendations with the exception of the FIT test which has not been collected. If there are labs her specialist wants collected they need to put lab orders in her chart. I briefly reviewed the last couple of specialist notes and do not see any labs mentions. Due to time constraints I am not able to go through every specialist note in detail to see if labs are needed.

## 2020-05-18 NOTE — Unmapped (Signed)
Jackie aware

## 2020-05-19 ENCOUNTER — Encounter: Payer: Medicare Other | Attending: Critical Care Medicine

## 2020-05-19 DIAGNOSIS — D860 Sarcoidosis of lung: Secondary | ICD-10-CM | POA: Insufficient documentation

## 2020-05-19 DIAGNOSIS — Z87891 Personal history of nicotine dependence: Secondary | ICD-10-CM | POA: Insufficient documentation

## 2020-05-19 DIAGNOSIS — J9611 Chronic respiratory failure with hypoxia: Secondary | ICD-10-CM | POA: Insufficient documentation

## 2020-05-19 MED ORDER — CYCLOBENZAPRINE 10 MG TABLET
ORAL_TABLET | 0 refills | 0 days | Status: CP
Start: 2020-05-19 — End: ?

## 2020-05-20 ENCOUNTER — Telehealth: Payer: Self-pay

## 2020-05-20 NOTE — Unmapped (Signed)
Patient is requesting the following refill  Requested Prescriptions     Pending Prescriptions Disp Refills   ??? cyclobenzaprine (FLEXERIL) 10 MG tablet [Pharmacy Med Name: Cyclobenzaprine HCl 10 MG Oral Tablet] 90 tablet 0     Sig: Take 1 tablet by mouth three times daily as needed       Recent Visits  Date Type Provider Dept   01/19/20 Office Visit Keri Rosita Fire, FNP [place] [facility]   12/19/19 Office Visit Hope Francesco Sor, MD [place] [facility]   11/11/19 Office Visit Keri Rosita Fire, FNP [place] [facility]   05/29/19 Office Visit Keri Rosita Fire, FNP  [facility]   Showing recent visits within past 365 days with a meds authorizing provider and meeting all other requirements  Future Appointments  No visits were found meeting these conditions.  Showing future appointments within next 365 days with a meds authorizing provider and meeting all other requirements

## 2020-05-20 NOTE — Telephone Encounter (Signed)
Patient reports not coming in due to swelling in legs - doctor gave her medication to help with that. She could come in to rehab during this time, but she reports not feeling comfortable coming in until it resolves.

## 2020-05-23 NOTE — Unmapped (Signed)
Addended byDuanne Limerick on: 05/23/2020 02:18 PM     Modules accepted: Orders

## 2020-05-24 NOTE — Unmapped (Signed)
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 Patient's condition was discussed with the patient in detail.  Patient was given opportunity to ask questions.     Patient has ulcers to right foot lateral 3rd toe and medial 4th toe. The 4th toe ulcer is stable. However, the 3rd toe ulcer has worsened and probes to bone with a fibrotic wound base and edema/erythema.    Aerobic/anaerobic culture from 3rd toe ulcer taken and ordered.     New right foot XR ordered to assess for osteomyelitis.    We discussed that although we need to evaluate further with imaging, right 3rd toe amputation is likely given clinical presentation of infection. Patient expressed understanding.     RLE arterial duplex ordered given slow wound healing.     Message sent to ID.     Procedure: excisional debridement of the right foot wounds, 3rd toe lateral and 4th toe medial  At this time patient's wound was debrided due to the increase in devitalized tissue of epidermis/dermis, exudate, callus tissue, and other necrotic/fibrotic tissue impairing proper wound healing as reported in the Objective. All benefits, risks, and alternatives were explained in detail with the patient. A time out was taken prior to the procedure. The area was prepped in standard clinic fashion. An excisional Sharp, surgical debridement was performed to fully debride and remove all central devitalized and necrotic tissue including subcutaneous tissue. The debridement occurred to the level of bone until  bleeding granulation tissue was observed in 100% of the wound bed.  This was achieved with the use of a curette, forceps and scalpel. Blood loss was minimal.  Hemostasis was achieved with pressure and the post-debridement measurements are documented in the chart. Patient tolerated procedure well at this time.    Dressings: Dakin's WTD dressings applied. Patient to not apply ketoconazole 2% for now. Patient should continue to change dressings daily and to avoid soaking foot.     Dakin's 0.25% solution prescribed.     Activity: WBAT in [facility] shoe to right foot. .    Nails 3,4  Right debrided with nail nipper. Patient tolerated procedure well.    RTC in 1 week.     All questions were answered and no guarantees were given or implied. Patient expressed understanding.     History of Present Illness: Patient is a 55 y.o.  female with a history of T2DM, HTN, COPD, hypoxia, sarcoidosis, severe persistent asthma dependent on systemic steroids who returns to the clinic regarding right foot ulcers between 3rd and 4th toes at PIPJ level. Patient is still on 5 L O2 per minute; she is believed to have fluid in her lungs per her chest XR this morning at [facility]. During pulmonary rehab, patient reports being required to wear sneakers and not being able to ambulate in her [facility] shoe. Additionally, patient is still waiting for diabetic shoes. Patient requests nails debridement today as she is unable to debride nails herself. Patient denies any other  complaints. Patient denies fever, nausea, vomiting, chills, SOB, chest pain      A1c was 10.1% on 12/12/2019    Past Medical History:   Diagnosis Date   ??? 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 Breast injury     lung surg on left incision under breast sept 2016   ??? Caregiver burden     parents    ??? Hepatitis C antibody test positive 2016    repeatedly negative HCV RNA, indicating clearance of infection w/o treatment   ??? Hyperlipidemia    ??? Hypertension    ??? 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 On home O2 2008    per Dr Mal Amabile note from 02/21/2017   ??? Pulmonary aspergillosis (CMS-HCC) 2016    A.fumigatus in 2016, prior to LUL lobectomy. A.niger from sputum 08/2016-09/2016.   ??? 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 Sarcoidosis 1995   ??? Type 2 diabetes mellitus with hyperglycemia (CMS-HCC) 07/27/2014   ??? Visual impairment     glasses     No Known Allergies     ROS:   Constitutional:  Denies fever or chills   Eyes:  Denies change in visual acuity   HENT:  Denies nasal congestion or sore throat   Respiratory:  Denies cough or shortness of breath   Cardiovascular:  Denies chest pain or edema   GI:  Denies abdominal pain, nausea, vomiting, bloody stools or diarrhea   GU:  Denies dysuria   Musculoskeletal:  Denies back pain or joint pain   Integument:  Denies rash   Neurologic:  Denies headache, focal weakness or sensory changes   Endocrine:  Denies polyuria or polydipsia   Lymphatic:  Denies swollen glands   Psychiatric:  Denies depression or anxiety     Test Results  Imaging: Pending    Physical:   General Appearance: well nourished, Aox3    Vascular: Upon doppler exam: biphasic DP and PT.     Skin: Ulcers to right foot lateral 3rd toe and medial 4th toe. The 4th toe ulcer is stable. However, the 3rd toe ulcer has worsened and probes to bone with fibrotic wound base and edema/erythema. Medial 4th toe wound measures 1.5 x 1 x 0.5 cm. Lateral 3rd toe wound measures 1 x 1 x 0.8 cm.     Dry, flaky skin noted to plantar aspect of bilateral feet.   Nails of are elongated, discolored, distrophic with sunungual debris x 10.    Neurological: Protective sensation is diminished bilaterally. Sharp/dull sensation is diminished bilaterally.     MSK: Stable and unchanged.                I personally spent 30 minutes face-to-face and non-face-to-face in the care of this patient, which includes all pre, intra, and post visit time on the date of service.      Scribe's Attestation: Otho Perl, DPM obtained and performed the history, physical exam and medical decision making elements that were  entered into the chart. Documentation assistance was provided by me personally, a scribe. Signed by Azalee Course, Scribe, on May 25, 2020 at 8:05 AM.     Documentation assistance provided by the Scribe. I was present during the time the encounter was recorded. The information recorded by the Scribe was done at my direction and has been reviewed and validated by me.

## 2020-05-25 ENCOUNTER — Ambulatory Visit: Admit: 2020-05-25 | Discharge: 2020-05-26 | Payer: MEDICARE

## 2020-05-25 DIAGNOSIS — L97516 Non-pressure chronic ulcer of other part of right foot with bone involvement without evidence of necrosis: Principal | ICD-10-CM

## 2020-05-25 DIAGNOSIS — B351 Tinea unguium: Principal | ICD-10-CM

## 2020-05-25 DIAGNOSIS — E119 Type 2 diabetes mellitus without complications: Principal | ICD-10-CM

## 2020-05-25 DIAGNOSIS — B353 Tinea pedis: Principal | ICD-10-CM

## 2020-05-25 DIAGNOSIS — L03115 Cellulitis of right lower limb: Principal | ICD-10-CM

## 2020-05-25 MED ORDER — SODIUM HYPOCHLORITE 0.25 % SOLUTION
Freq: Once | TOPICAL | 2 refills | 0 days | Status: CP
Start: 2020-05-25 — End: 2020-05-25

## 2020-05-25 NOTE — Unmapped (Addendum)
During your visit today the Doctor has ordered for you:  Xrays of     You can get the X-rays at any [facility] Radiology Department.   Here is the closest one to this Clinic:    [facility]  Address: [address] [place], [place] [address]        WOUND CARE INSTRUCTIONS:    Wash area with Antibacterial liquid soap with one wash cloth, clean the soap off with a clean cloth, and then pat dry with a dry cloth before the dressing change.    Dressing: Clean with daken solution,  Pack 3rd & 4th toe with dakens soaked gauze then cover with dry gauze.      If you have any questions or concerns regarding your wound or wound care, please contact us at the [facility] clinic at [phone].        If your wound starts to develop the following , please call the [facility] for further advise:    ??  Increased drainage  ??  Redness around the wound  ??  Strong odor from the wound when changing the bandages  ??  Increased pain    Please do not hesitate to leave a voicemail on the nurse line. We make every effort to return your call the same day or the next day. Please leave a clear message with your name, date of birth,  and your medical record number. Leave a brief description of your problem.    If you are experiencing the following, please call us for advise or consider going to the nearest local Emergency Department or call 911.    ??  Fever of 100 F  ??  Nausea or Vomitting  ??  Pus draining from your wound  ??  Redness of the whole foot or leg  ??  Severe increase in pain above your baseline.    [facility]  [phone]

## 2020-05-26 ENCOUNTER — Telehealth: Payer: Self-pay | Admitting: *Deleted

## 2020-05-26 ENCOUNTER — Encounter: Payer: Self-pay | Admitting: *Deleted

## 2020-05-26 DIAGNOSIS — D869 Sarcoidosis, unspecified: Secondary | ICD-10-CM

## 2020-05-26 NOTE — Telephone Encounter (Signed)
Called to check on Charlotte Cross.  She has been having a hard time with her feet being swollen and possibly around her lungs as well.  She has started some fluid medication and to help with swelling.  We talked about keeping feet elevated when not moving.  She has a follow up call tomorrow and possible appt on Friday.  We talked about returning to rehab next week to keep moving and fluid moving.

## 2020-05-27 ENCOUNTER — Other Ambulatory Visit: Admit: 2020-05-27 | Discharge: 2020-05-28 | Payer: MEDICARE

## 2020-05-27 ENCOUNTER — Ambulatory Visit: Admit: 2020-05-27 | Discharge: 2020-05-28 | Payer: MEDICARE

## 2020-05-27 DIAGNOSIS — B441 Other pulmonary aspergillosis: Principal | ICD-10-CM

## 2020-05-27 DIAGNOSIS — Z794 Long term (current) use of insulin: Principal | ICD-10-CM

## 2020-05-27 DIAGNOSIS — E1165 Type 2 diabetes mellitus with hyperglycemia: Principal | ICD-10-CM

## 2020-05-28 ENCOUNTER — Ambulatory Visit: Admit: 2020-05-28 | Discharge: 2020-05-29 | Payer: MEDICARE

## 2020-05-28 LAB — POSACONAZOLE: POSACONAZOLE LEVEL: 758 ng/mL

## 2020-05-28 NOTE — Unmapped (Signed)
[facility] Refill Coordination Note    Specialty Medication(s) to be Shipped:   CF/Pulmonary: -Nucala 100mg /ml  Other medication(s) to be shipped: No additional medications requested for fill at this time     Deniece Ree, DOB: Jan 21, 1966  Phone: [phone] (home) [phone] (work)    All above HIPAA information was verified with patient.     Was a Nurse, learning disability used for this call? No    Completed refill call assessment today to schedule patient's medication shipment from the [facility] [phone]).       Specialty medication(s) and dose(s) confirmed: Regimen is correct and unchanged.   Changes to medications: [name] reports no changes at this time.  Changes to insurance: No  Questions for the pharmacist: No    Confirmed patient received Welcome Packet with first shipment. The patient will receive a drug information handout for each medication shipped and additional FDA Medication Guides as required.       DISEASE/MEDICATION-SPECIFIC INFORMATION        For CF patients: CF [facility] Active? No-not enrolled    SPECIALTY MEDICATION ADHERENCE     Medication Adherence    Patient reported X missed doses in the last month: 0  Specialty Medication: Nucala 100mg /ml  Patient is on additional specialty medications: No  Patient is on more than two specialty medications: No  Informant: patient  Reliability of informant: reliable  Reasons for non-adherence: no problems identified        Nucala 100mg /ml : 0 days of medicine on hand (Next Injection: 06/14/2020)    SHIPPING     Shipping address confirmed in Epic.     Delivery Scheduled: Yes, Expected medication delivery date: 06/10/2020.     Medication will be delivered via Same Day Courier to the prescription address in Epic WAM.    Emiliana Blaize P Allena Katz   [facility] Specialty Technician

## 2020-05-30 DIAGNOSIS — D869 Sarcoidosis, unspecified: Principal | ICD-10-CM

## 2020-05-30 MED ORDER — ALBUTEROL SULFATE HFA 90 MCG/ACTUATION AEROSOL INHALER
RESPIRATORY_TRACT | 1 refills | 0 days | Status: CP | PRN
Start: 2020-05-30 — End: 2021-05-30

## 2020-05-30 MED ORDER — INSULIN GLARGINE (U-100) 100 UNIT/ML (3 ML) SUBCUTANEOUS PEN
Freq: Every day | SUBCUTANEOUS | 1 refills | 90 days | Status: CP
Start: 2020-05-30 — End: 2021-05-30

## 2020-05-30 MED ORDER — INSULIN ASPART U-100  100 UNIT/ML SUBCUTANEOUS SOLUTION
1 refills | 0 days | Status: CP
Start: 2020-05-30 — End: 2021-05-30

## 2020-05-31 DIAGNOSIS — M869 Osteomyelitis, unspecified: Principal | ICD-10-CM

## 2020-05-31 MED ORDER — CLINDAMYCIN HCL 300 MG CAPSULE
ORAL_CAPSULE | Freq: Three times a day (TID) | ORAL | 0 refills | 14 days | Status: CP
Start: 2020-05-31 — End: 2020-06-14

## 2020-06-01 NOTE — Unmapped (Unsigned)
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 Patient's condition was discussed with the patient in detail.  Patient was given opportunity to ask questions.     Patient has ulcers to right foot lateral 3rd toe and medial 4th toe. The 4th toe ulcer is stable. However, the 3rd toe ulcer has worsened and probes to bone with a fibrotic wound base and edema/erythema.    Aerobic/anaerobic culture from 3rd toe ulcer grew 4+ MSSA.    Antibiotics: Continue PO clindamycin 300 mg TID.    Right foot XR from 05/27/20 reviewed in detail with patient showing new erosive changes of 3rd toe middle and proximal phalanges, suspicious of OM, with new nondisplaced, mildly impacted transverse fracture of proximal phalangeal head; focal cortical irregularity of 3rd toe distal phalangeal base laterally, well-corticated and likely chronic.    ***We discussed that although we need to evaluate further with imaging, right 3rd toe amputation is likely given clinical presentation of infection. Patient expressed understanding.     RLE arterial duplex from 05/28/20 showed runoff obstruction present with no flow detected in PTA but normal ATA and peroneal vessels with great toe pressure of 116 mmHg.    Procedure: excisional debridement of the right foot wounds, 3rd toe lateral and 4th toe medial  At this time patient's wound was debrided due to the increase in devitalized tissue of epidermis/dermis, exudate, callus tissue, and other necrotic/fibrotic tissue impairing proper wound healing as reported in the Objective. All benefits, risks, and alternatives were explained in detail with the patient. A time out was taken prior to the procedure. The area was prepped in standard clinic fashion. An excisional Sharp, surgical debridement was performed to fully debride and remove all central devitalized and necrotic tissue including subcutaneous tissue. The debridement occurred to the level of bone until  bleeding granulation tissue was observed in 100% of the wound bed.  This was achieved with the use of a curette, forceps and scalpel. Blood loss was minimal.  Hemostasis was achieved with pressure and the post-debridement measurements are documented in the chart. Patient tolerated procedure well at this time.    Dressings: Dakin's WTD dressings applied. Patient to not apply ketoconazole 2% for now. Patient should continue to change dressings daily and to avoid soaking foot.    Activity: WBAT in [facility] shoe to right foot. .    We discussed strict return precautions to the ED including redness, swelling, purulent drainage, fever, or chills.     RTC in ***    All questions were answered and no guarantees were given or implied. Patient expressed understanding.     History of Present Illness: Patient is a 55 y.o.  female with a history of T2DM, HTN, COPD, hypoxia, sarcoidosis, severe persistent asthma dependent on systemic steroids who returns to the clinic regarding right foot ulcers between 3rd and 4th toes at PIPJ level.       Patient denies any other complaints. Patient denies fever, nausea, vomiting, chills, SOB,  chest pain      A1c was 10.1% on 12/12/2019    Past Medical History:   Diagnosis Date   ??? 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 Breast injury     lung surg on left incision under breast sept 2016   ??? Caregiver burden     parents    ??? Hepatitis C antibody test positive 2016    repeatedly negative HCV RNA, indicating clearance of infection w/o treatment   ??? Hyperlipidemia    ??? Hypertension    ??? 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 On home O2 2008    per Dr Mal Amabile note from 02/21/2017   ??? Pulmonary aspergillosis (CMS-HCC) 2016    A.fumigatus in 2016, prior to LUL lobectomy. A.niger from sputum 08/2016-09/2016.   ??? 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 Sarcoidosis 1995   ??? Type 2 diabetes mellitus with hyperglycemia (CMS-HCC) 07/27/2014   ??? Visual impairment     glasses     No Known Allergies     ROS:   Constitutional:  Denies fever or chills   Eyes:  Denies change in visual acuity   HENT:  Denies nasal congestion or sore throat   Respiratory:  Denies cough or shortness of breath   Cardiovascular:  Denies chest pain or edema   GI:  Denies abdominal pain, nausea, vomiting, bloody stools or diarrhea   GU:  Denies dysuria   Musculoskeletal:  Denies back pain or joint pain   Integument:  Denies rash   Neurologic:  Denies headache, focal weakness or sensory changes   Endocrine:  Denies polyuria or polydipsia   Lymphatic:  Denies swollen glands   Psychiatric:  Denies depression or anxiety     Test Results  Imaging: Pending    Physical:   General Appearance: well nourished, Aox3    Vascular: Upon doppler exam: biphasic DP and PT.     Skin: Ulcers to right foot lateral 3rd toe and medial 4th toe. The 4th toe ulcer is stable. However, the 3rd toe ulcer has worsened and probes to bone with fibrotic wound base and edema/erythema. Medial 4th toe wound measures 1.5 x 1 x 0.5 cm. Lateral 3rd toe wound measures 1 x 1 x 0.8 cm.     Dry, flaky skin noted to plantar aspect of bilateral feet.   Nails of are elongated, discolored, distrophic with sunungual debris x 10.    Neurological: Protective sensation is diminished bilaterally. Sharp/dull sensation is diminished bilaterally.     MSK: Stable and unchanged.     ***pic      I personally spent 20 minutes face-to-face and non-face-to-face in the care of this patient, which includes all pre, intra, and post visit time on the date of service.      Scribe's Attestation: Otho Perl, DPM obtained and performed the history, physical exam and medical decision making elements that were  entered into the chart. Documentation assistance was provided by me personally, a scribe. Signed by Azalee Course, Scribe, on June 01, 2020 at 8:07 AM.     {*** NOTE TO PROVIDER: PLEASE ADD ATTESTATION NOTING YOU AGREE WITH SCRIBE DOCUMENTATION. ***}

## 2020-06-04 NOTE — Unmapped (Unsigned)
Assessment and Plan:     There are no diagnoses linked to this encounter.    I personally spent *** minutes face-to-face and non-face-to-face in the care of this patient, which includes all pre, intra, and post visit time on the date of service.    No follow-ups on file.    HPI:      Karen Barber  is here for No chief complaint on file.    {kerichronicdx:74702}    {keriacutedx:74703}     ROS:      Comprehensive 10 point ROS negative unless otherwise stated in the HPI.      [facility] Components:     Medication adherence and barriers to the treatment plan have been addressed. Opportunities to optimize healthy behaviors have been discussed. Patient / caregiver voiced understanding.    Past Medical/Surgical History:     Past Medical History:   Diagnosis Date   ??? 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 Breast injury     lung surg on left incision under breast sept 2016   ??? Caregiver burden     parents    ??? Hepatitis C antibody test positive 2016    repeatedly negative HCV RNA, indicating clearance of infection w/o treatment   ??? Hyperlipidemia    ??? Hypertension    ??? 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 On home O2 2008    per Dr Mal Amabile note from 02/21/2017   ??? Pulmonary aspergillosis (CMS-HCC) 2016    A.fumigatus in 2016, prior to LUL lobectomy. A.niger from sputum 08/2016-09/2016.   ??? 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 Sarcoidosis 1995   ??? Type 2 diabetes mellitus with hyperglycemia (CMS-HCC) 07/27/2014   ??? Visual impairment     glasses     Past Surgical History:   Procedure Laterality Date   ??? LUNG LOBECTOMY     ??? PR AMPUTATION TOE,MT-P JT Right 12/12/2019    Procedure: Right foot fifth toe amputation at the metatarsophalangeal joint level;  Surgeon: Britt Bottom, DPM;  Location: MAIN OR [facility];  Service: Vascular   ??? PR RIGHT HEART CATH O2 SATURATION & CARDIAC OUTPUT N/A 04/29/2019 Procedure: Right Heart Catheterization;  Surgeon: Rosana Hoes, MD;  Location: [facility] CATH;  Service: Cardiology   ??? PR THORACOSCOPY SURG TOT PULM DECORT Left 12/14/2014    Procedure: THORACOSCOPY SURG; W/TOT PULM DECORTIC/PNEUMOLYS;  Surgeon: Evert Kohl, MD;  Location: MAIN OR [facility];  Service: Cardiothoracic   ??? PR THORACOSCOPY W/THERA WEDGE RESEXN INITIAL UNILAT Left 12/03/2014    Procedure: THORACOSCOPY, SURGICAL; WITH THERAPEUTIC WEDGE RESECTION (EG, MASS, NODULE) INITIAL UNILATERAL;  Surgeon: Evert Kohl, MD;  Location: MAIN OR [facility];  Service: Cardiothoracic       Family History:     Family History   Problem Relation Age of Onset   ??? Diabetes Father    ??? Diabetes Brother    ??? Diabetes Maternal Aunt    ??? Sarcoidosis Maternal Aunt    ??? Diabetes Maternal Uncle    ??? Diabetes Paternal Aunt    ??? Diabetes Paternal Uncle        Social History:     Social History     Tobacco Use   ??? Smoking status: Former Smoker     Packs/day: 0.50     Years: 9.00     Pack years: 4.50  Types: Cigarettes     Quit date: 05/03/1987     Years since quitting: 33.1   ??? Smokeless tobacco: Never Used   Vaping Use   ??? Vaping Use: Never used   Substance Use Topics   ??? Alcohol use: No     Alcohol/week: 0.0 standard drinks   ??? Drug use: No       Allergies:     Patient has no known allergies.    Current Medications:     Current Outpatient Medications   Medication Sig Dispense Refill   ??? ACCU-CHEK FASTCLIX LANCET DRUM Misc USE TO CHECK GLUCOSE 4 TIMES DAILY BEFORE MEAL(S) AND NIGHTLY     ??? ACCU-CHEK GUIDE GLUCOSE METER Misc 1 Device by Other route Four (4) times a day (before meals and nightly). AS DIRECTED 1 kit 0   ??? acetaminophen (TYLENOL) 325 MG tablet Take 2 tablets (650 mg total) by mouth every four (4) hours as needed. 30 tablet 1   ??? albuterol 2.5 mg /3 mL (0.083 %) nebulizer solution Inhale 3 mL (2.5 mg total) by nebulization every six (6) hours as needed for wheezing or shortness of breath (airway clearance/cough). 120 mL 0   ??? albuterol HFA 90 mcg/actuation inhaler Inhale 2 puffs every four (4) hours as needed for wheezing or shortness of breath. 24 g 1   ??? amLODIPine (NORVASC) 5 MG tablet Take 5 mg by mouth daily. Added per patient     ??? blood sugar diagnostic Strp by Other route Three (3) times a day. ACCU-CHEK Guide meter 200 strip 2   ??? budesonide-formoteroL (SYMBICORT) 160-4.5 mcg/actuation inhaler Inhale 2 puffs Two (2) times a day. 10.2 g 3   ??? clindamycin (CLEOCIN) 300 MG capsule Take 1 capsule (300 mg total) by mouth Three (3) times a day for 14 days. 42 capsule 0   ??? cyclobenzaprine (FLEXERIL) 10 MG tablet Take 1 tablet by mouth three times daily as needed 90 tablet 0   ??? empty container Misc USE AS DIRECTED 1 each 2   ??? furosemide (LASIX) 20 MG tablet Take 20 mg by mouth daily as needed.     ??? gabapentin (NEURONTIN) 300 MG capsule Take 1 capsule (300 mg total) by mouth Three (3) times a day. 270 capsule 1   ??? hydroCHLOROthiazide (HYDRODIURIL) 25 MG tablet Take 1 tablet by mouth once daily 90 tablet 0   ??? insulin ASPART (NOVOLOG U-100 INSULIN ASPART) 100 unit/mL injection Take 1-20 units SQ QID AC and HS as directed 20 mL 1   ??? insulin glargine (BASAGLAR, LANTUS) 100 unit/mL (3 mL) injection pen Inject 0.24 mL (24 Units total) under the skin daily. 21.6 mL 1   ??? ipratropium (ATROVENT) 0.02 % nebulizer solution Inhale 2.5 mL (500 mcg total) by nebulization Four (4) times a day. (Patient taking differently: Inhale 500 mcg by nebulization Two (2) times a day. ) 900 mL 1   ??? ketoconazole (NIZORAL) 2 % cream Apply 1 application topically Two (2) times a day. 60 g 5   ??? MEDICAL SUPPLY ITEM Accucheck Glide Glocumeter for home glucose checks. Check blood glucose ACHS. 1 Package 0   ??? mepolizumab (NUCALA) 100 mg/mL AtIn Inject the contents of 1 syringe (100 mg) under the skin every twenty-eight (28) days. 1 mL 12   ??? metFORMIN (GLUCOPHAGE) 1000 MG tablet Take 1 tablet (1,000 mg total) by mouth 2 (two) times a day with meals. 180 tablet 1   ??? miscellaneous medical supply Misc Nebulizer machine  and tubing. Patients machine is 57 years old. 1 each 0   ??? OXYGEN-AIR DELIVERY SYSTEMS MISC Dose: 2-3 LPM     ??? posaconazole (NOXAFIL) 100 mg delayed released tablet Take 2 tablets (200 mg total) by mouth daily. 60 tablet 2   ??? pravastatin (PRAVACHOL) 40 MG tablet Take 1 tablet (40 mg total) by mouth daily with evening meal. 90 tablet 1   ??? predniSONE (DELTASONE) 5 MG tablet Take 5 mg by mouth daily.     ??? sildenafiL, pulm.hypertension, (REVATIO) 20 mg tablet Take 1 tablet (20 mg total) by mouth Three (3) times a day. 90 tablet 11   ??? treprostiniL (TYVASO) 1.74 mg/2.9 mL (0.6 mg/mL) Nebu Inhale 18 mcg four (4) times a day.       No current facility-administered medications for this visit.       Health Maintenance:     Health Maintenance   Topic Date Due   ??? HPV Cotest with Pap Smear (21-65)  Never done   ??? Pap Smear with Cotest HPV (21-65)  02/27/2001   ??? FIT-DNA Stool Test  Never done   ??? Zoster Vaccines (1 of 2) Never done   ??? DTaP/Tdap/Td Vaccines (2 - Td or Tdap) 07/07/2017   ??? Retinal Eye Exam  11/15/2019   ??? Mammogram Start Age 44  02/29/2020   ??? Hemoglobin A1c  08/27/2020   ??? Foot Exam  01/18/2021   ??? Urine Albumin/Creatinine Ratio  01/18/2021   ??? Serum Creatinine Monitoring  02/17/2021   ??? Potassium Monitoring  02/17/2021   ??? COPD Spirometry  01/04/2022   ??? Hepatitis C Screen  Completed   ??? COVID-19 Vaccine  Completed   ??? Influenza Vaccine  Completed   ??? Pneumococcal Vaccine  Completed       Immunizations:     Immunization History   Administered Date(s) Administered   ??? COVID-19 VACC,MRNA,(PFIZER)(PF)(IM) 06/11/2019, 07/02/2019, 11/21/2019   ??? INFLUENZA TIV (TRI) PF (IM) 01/02/2005, 12/14/2008   ??? Influenza Vaccine Quad (IIV4 PF) 43mo+ injectable 11/28/2012, 12/19/2014, 12/16/2015, 01/03/2017, 12/06/2017, 01/08/2019, 12/15/2019   ??? PNEUMOCOCCAL POLYSACCHARIDE 23 07/08/2007, 01/03/2017   ??? PPD Test 01/19/2015   ??? TdaP 07/08/2007     I have reviewed and (if needed) updated the patient's problem list, medications, allergies, past medical and surgical history, social and family history.     Vital Signs:     Wt Readings from Last 3 Encounters:   01/19/20 97.1 kg (214 lb)   01/13/20 (!) 107 kg (236 lb)   12/19/19 (!) 107 kg (236 lb)     Temp Readings from Last 3 Encounters:   05/25/20 (P) 36.4 ??C (97.6 ??F) (Temporal)   04/06/20 36.2 ??C (97.1 ??F) (Temporal)   03/16/20 36.4 ??C (97.5 ??F) (Temporal)     BP Readings from Last 3 Encounters:   05/25/20 108/81   04/06/20 123/84   03/16/20 126/85     Pulse Readings from Last 3 Encounters:   05/25/20 89   04/06/20 85   03/16/20 90     Estimated body mass index is 29.85 kg/m?? as calculated from the following:    Height as of 01/19/20: 180.3 cm (5' 11).    Weight as of 01/19/20: 97.1 kg (214 lb).  No height and weight on file for this encounter.      Objective:      General: Alert and oriented x3. Well-appearing. No acute distress. ***  HEENT:  Normocephalic.  Atraumatic. Conjunctiva and sclera normal. OP MMM  without lesions. ***  Neck:  Supple. No thyroid enlargement. No adenopathy. ***  Heart:  Regular rate and rhythm . Normal S1, S2.  No murmurs, rubs or gallops. ***  Lungs:  No respiratory distress.  Lungs clear to auscultation. No wheezes, rhonchi, or rales. ***  GI/GU:  Soft, +BS, nondistended, non-TTP. No palpable masses or organomegaly. ***  Extremities:  No edema. Peripheral pulses normal. ***  Skin:  Warm, dry. No rash or lesions present. ***  Neuro:  Non-focal. No obvious weakness. ***  Psych:  Affect normal, eye contact good, speech clear and coherent. ***     I attest that I, Bayard Hugger, personally documented this note while acting as scribe for Noralyn Pick, FNP.      Bayard Hugger, Scribe.  06/07/2020     The documentation recorded by the scribe accurately reflects the service I personally performed and the decisions made by me.    Noralyn Pick, FNP

## 2020-06-06 NOTE — Unmapped (Unsigned)
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 Patient's condition was discussed with the patient in detail.  Patient was given opportunity to ask questions.     Patient has ulcers to right foot lateral 3rd toe and medial 4th toe. The 4th toe ulcer is stable. However, the 3rd toe ulcer has worsened and probes to bone with a fibrotic wound base and edema/erythema.    Aerobic/anaerobic culture from 3rd toe ulcer grew 4+ MSSA.    Antibiotics: Continue PO clindamycin 300 mg TID.    Right foot XR from 05/27/20 reviewed in detail with patient showing new erosive changes of 3rd toe middle and proximal phalanges, suspicious of OM, with new nondisplaced, mildly impacted transverse fracture of proximal phalangeal head; focal cortical irregularity of 3rd toe distal phalangeal base laterally, well-corticated and likely chronic.    ***We discussed that although we need to evaluate further with imaging, right 3rd toe amputation is likely given clinical presentation of infection. Patient expressed understanding.     RLE arterial duplex from 05/28/20 showed runoff obstruction present with no flow detected in PTA but normal ATA and peroneal vessels with great toe pressure of 116 mmHg.    Procedure: excisional debridement of the right foot wounds, 3rd toe lateral and 4th toe medial  At this time patient's wound was debrided due to the increase in devitalized tissue of epidermis/dermis, exudate, callus tissue, and other necrotic/fibrotic tissue impairing proper wound healing as reported in the Objective. All benefits, risks, and alternatives were explained in detail with the patient. A time out was taken prior to the procedure. The area was prepped in standard clinic fashion. An excisional Sharp, surgical debridement was performed to fully debride and remove all central devitalized and necrotic tissue including subcutaneous tissue. The debridement occurred to the level of bone until  bleeding granulation tissue was observed in 100% of the wound bed.  This was achieved with the use of a curette, forceps and scalpel. Blood loss was minimal.  Hemostasis was achieved with pressure and the post-debridement measurements are documented in the chart. Patient tolerated procedure well at this time.    Dressings: Dakin's WTD dressings applied. Patient to not apply ketoconazole 2% for now. Patient should continue to change dressings daily and to avoid soaking foot.    Activity: WBAT in [facility] shoe to right foot. .    We discussed strict return precautions to the ED including redness, swelling, purulent drainage, fever, or chills.     RTC in ***    All questions were answered and no guarantees were given or implied. Patient expressed understanding.     History of Present Illness: Patient is a 55 y.o.  female with a history of T2DM, HTN, COPD, hypoxia, sarcoidosis, severe persistent asthma dependent on systemic steroids who returns to the clinic regarding right foot ulcers between 3rd and 4th toes at PIPJ level. She is on PO clindamycin 300 mg TID until 06/14/20.    Today,  Patient denies any other complaints. Patient denies fever, nausea, vomiting, chills, SOB, chest pain      A1c was 10.1% on 12/12/2019    Past Medical History:   Diagnosis Date   ??? 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 Breast injury     lung surg on left incision under breast sept 2016   ??? Caregiver burden     parents ??? Hepatitis C antibody test positive 2016    repeatedly negative HCV RNA, indicating clearance of infection w/o treatment   ??? Hyperlipidemia    ??? Hypertension    ??? 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 On home O2 2008    per Dr Mal Amabile note from 02/21/2017   ??? Pulmonary aspergillosis (CMS-HCC) 2016    A.fumigatus in 2016, prior to LUL lobectomy. A.niger from sputum 08/2016-09/2016.   ??? 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 Sarcoidosis 1995   ??? Type 2 diabetes mellitus with hyperglycemia (CMS-HCC) 07/27/2014   ??? Visual impairment     glasses     No Known Allergies     ROS:   Constitutional:  Denies fever or chills   Eyes:  Denies change in visual acuity   HENT:  Denies nasal congestion or sore throat   Respiratory:  Denies cough or shortness of breath   Cardiovascular:  Denies chest pain or edema   GI:  Denies abdominal pain, nausea, vomiting, bloody stools or diarrhea   GU:  Denies dysuria   Musculoskeletal:  Denies back pain or joint pain   Integument:  Denies rash   Neurologic:  Denies headache, focal weakness or sensory changes   Endocrine:  Denies polyuria or polydipsia   Lymphatic:  Denies swollen glands   Psychiatric:  Denies depression or anxiety     Test Results  Imaging: Pending    Physical:   General Appearance: well nourished, Aox3    Vascular: Upon doppler exam: biphasic DP and PT.     Skin: Ulcers to right foot lateral 3rd toe and medial 4th toe. The 4th toe ulcer is stable. However, the 3rd toe ulcer has worsened and probes to bone with fibrotic wound base and edema/erythema. Medial 4th toe wound measures 1.5 x 1 x 0.5 cm. Lateral 3rd toe wound measures 1 x 1 x 0.8 cm.     Dry, flaky skin noted to plantar aspect of bilateral feet.   Nails of are elongated, discolored, distrophic with sunungual debris x 10.    Neurological: Protective sensation is diminished bilaterally. Sharp/dull sensation is diminished bilaterally.     MSK: Stable and unchanged. ***pic      I personally spent 20 minutes face-to-face and non-face-to-face in the care of this patient, which includes all pre, intra, and post visit time on the date of service.

## 2020-06-08 ENCOUNTER — Ambulatory Visit: Admit: 2020-06-08 | Discharge: 2020-06-08 | Payer: MEDICARE

## 2020-06-08 ENCOUNTER — Encounter: Payer: Self-pay | Admitting: *Deleted

## 2020-06-08 ENCOUNTER — Telehealth: Payer: Self-pay | Admitting: *Deleted

## 2020-06-08 DIAGNOSIS — M869 Osteomyelitis, unspecified: Principal | ICD-10-CM

## 2020-06-08 DIAGNOSIS — L03115 Cellulitis of right lower limb: Principal | ICD-10-CM

## 2020-06-08 DIAGNOSIS — Z01818 Encounter for other preprocedural examination: Principal | ICD-10-CM

## 2020-06-08 DIAGNOSIS — L97516 Non-pressure chronic ulcer of other part of right foot with bone involvement without evidence of necrosis: Principal | ICD-10-CM

## 2020-06-08 DIAGNOSIS — D869 Sarcoidosis, unspecified: Secondary | ICD-10-CM

## 2020-06-08 LAB — CBC W/ AUTO DIFF
BASOPHILS ABSOLUTE COUNT: 0 10*9/L (ref 0.0–0.1)
BASOPHILS RELATIVE PERCENT: 0.9 %
EOSINOPHILS ABSOLUTE COUNT: 0.1 10*9/L (ref 0.0–0.5)
EOSINOPHILS RELATIVE PERCENT: 1.2 %
HEMATOCRIT: 31.3 % — ABNORMAL LOW (ref 34.0–44.0)
HEMOGLOBIN: 10.2 g/dL — ABNORMAL LOW (ref 11.3–14.9)
LYMPHOCYTES ABSOLUTE COUNT: 0.9 10*9/L — ABNORMAL LOW (ref 1.1–3.6)
LYMPHOCYTES RELATIVE PERCENT: 20 %
MEAN CORPUSCULAR HEMOGLOBIN CONC: 32.5 g/dL (ref 32.0–36.0)
MEAN CORPUSCULAR HEMOGLOBIN: 25.6 pg — ABNORMAL LOW (ref 25.9–32.4)
MEAN CORPUSCULAR VOLUME: 78.8 fL (ref 77.6–95.7)
MEAN PLATELET VOLUME: 8.5 fL (ref 6.8–10.7)
MONOCYTES ABSOLUTE COUNT: 0.7 10*9/L (ref 0.3–0.8)
MONOCYTES RELATIVE PERCENT: 14.9 %
NEUTROPHILS ABSOLUTE COUNT: 2.8 10*9/L (ref 1.8–7.8)
NEUTROPHILS RELATIVE PERCENT: 63 %
PLATELET COUNT: 284 10*9/L (ref 150–450)
RED BLOOD CELL COUNT: 3.97 10*12/L (ref 3.95–5.13)
RED CELL DISTRIBUTION WIDTH: 14.1 % (ref 12.2–15.2)
WBC ADJUSTED: 4.5 10*9/L (ref 3.6–11.2)

## 2020-06-08 LAB — BASIC METABOLIC PANEL
ANION GAP: 3 mmol/L — ABNORMAL LOW (ref 5–14)
BLOOD UREA NITROGEN: 26 mg/dL — ABNORMAL HIGH (ref 9–23)
BUN / CREAT RATIO: 21
CALCIUM: 9.3 mg/dL (ref 8.7–10.4)
CHLORIDE: 98 mmol/L (ref 98–107)
CO2: 34 mmol/L — ABNORMAL HIGH (ref 20.0–31.0)
CREATININE: 1.21 mg/dL — ABNORMAL HIGH
EGFR CKD-EPI AA FEMALE: 58 mL/min/{1.73_m2} — ABNORMAL LOW (ref >=60)
EGFR CKD-EPI NON-AA FEMALE: 50 mL/min/{1.73_m2} — ABNORMAL LOW (ref >=60)
GLUCOSE RANDOM: 320 mg/dL — ABNORMAL HIGH (ref 70–179)
POTASSIUM: 4.5 mmol/L (ref 3.4–4.8)
SODIUM: 135 mmol/L (ref 135–145)

## 2020-06-08 MED ORDER — DAKIN'S SOLUTION 0.125 %
Freq: Once | TOPICAL | 0 refills | 0 days | Status: CP
Start: 2020-06-08 — End: 2020-06-08

## 2020-06-08 NOTE — Unmapped (Addendum)
You can get labs and/or X-rays at [facility]  [address]  [place], [place] [address]              [facility]   [address]       At  2p          WOUND CARE INSTRUCTIONS:    Wash area with Antibacterial liquid soap with one wash cloth, clean the soap off with a clean cloth, and then pat dry with a dry cloth before the dressing change.    Dressing: Clean with daken solution,  Pack 3rd & 4th toe with dakens soaked gauze then cover with dry gauze.      If you have any questions or concerns regarding your wound or wound care, please contact us at the [facility] clinic at [phone].          If your wound starts to develop the following , please call the [facility] for further advise:    ??  Increased drainage  ??  Redness around the wound  ??  Strong odor from the wound when changing the bandages  ??  Increased pain    Please do not hesitate to leave a voicemail on the nurse line. We make every effort to return your call the same day or the next day. Please leave a clear message with your name, date of birth,  and your medical record number. Leave a brief description of your problem.    If you are experiencing the following, please call us for advise or consider going to the nearest local Emergency Department or call 911.    ??  Fever of 100 F  ??  Nausea or Vomitting  ??  Pus draining from your wound  ??  Redness of the whole foot or leg  ??  Severe increase in pain above your baseline.    [facility]  [phone]

## 2020-06-08 NOTE — Unmapped (Signed)
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 She is currently on posaconazole. She has also had prior AFB's with Mycobacterium fortuitum. Patient on 5 L of oxygen per minute is currently in pulmonary rehab, ~10-63m desaturates from 99% to 60%. PFTS severe restrictive disease + obstructive disease, CT showed honeycomb of ILD + COPD blebs. RHC started on Tyvaso and high dose sildenafil + amlodipine. Years of prednisone 10mg . Symbicort PRN, ipatropium, Nucala monthly. Albuterol and hypertonic saline neb. Audible crackles on exam, currently optimized to best of pulmonary abilities and is active in pulm rehab 3x a week. CT chest 3d showed a dilated pulmonary artery.    #Diabetes Mellitus Type 2 with Insulin Dependence and Vascular Compromise  A1c was 9.9% on 11/11/19, Amputation of 5th toe in 2021. Current regim novolog and lantus 24 U, metformin 1000.    #Previous Anesthetic Plan in 2021  For 5th toe amputation, had native airway, no preanesthetic medication and no intraoperative medication other than fluid. Regional block by surgeon. Patient tolerated and was consented for this again.      Right Heart Cath Results 05/03/19:  Right atrium Mean 5 mm Hg   Right ventricle Systolic 57 mm Hg, End-diastolic 8 mm Hg   Pulmonary artery ??Systolic 55 mm Hg, Diastolic 17 mm Hg, Mean 33 mm Hg   Pulmonary capillary wedge Mean 12 mm Hg     Arterial saturation: 89% (pulse oximetry)   Mixed venous saturation: 50%     Fick cardiac output: 4.9 L/min   Fick cardiac index: 2.1 L/min/m^2     Thermal cardiac output: 5.1 L/min   Thermal cardiac index: 2.3 L/min/m^2     Systemic vascular resistance (SVR): 1616.33 dynes*s/cm^5   Pulmonary vascular resistance (PVR): 4.29 Wood units    TTE 12/31/18  ????1. The left ventricle is normal in size with mildly increased wall   thickness.   ????2. Normal left ventricular systolic function, ejection fraction 60-65%.   ????3. Diastolic dysfunction - grade I (normal filling pressures).   ????4. Aortic sclerosis.   ????5. Normal right ventricular size and systolic function.   ????6. Pulmonary systolic pressure cannot be estimated due to insufficient TR   Jet.  ??  PET Impression From 12/09/18:  - Metabolically active fibrosis and mediastinal and supraclavicular nodes likely reflect active disease; overall level of avidity is moderate, with uptake similar to liver. Appearance of CT is mildly improved from prior, although substantial fibrosis remains.   -Dilated main pulmonary artery, as seen with pulmonary hypertension.    PULMONARY FUNCTION TESTING  Pulmonary Function Test (PFT) Latest Ref Rng & Units 11/07/2019 01/16/2020   FVC PRE L 1.67 1.40   FEV1 PRE L 1.21 1.06   FEV1/FVC PRE % 72.61 75.54   TLC PRE L 3.05 3.25   RV PRE L 1.38 1.74   FEF25-75% PRE L/s 0.85 0.82   Moderately severe restrictive lung disease       PreCare  Preop Checklist:    ?? Difficult IV access: yes, please have PCS staff to call in patient 20-40 min earlier  to obtain IV access  ?? Does the patient have a coronary stent no  ?? Avoid use of: N/A  ?? Anes Hx: negative  ?? History of difficult airway or positive predictors by physical exam: yes  ?? Objection to blood transfusion: no  ?? Pacemaker/AICD/bladder/bowel/spinal cord stimulator/LVAD/BIPAP: no  ?? Is the patient an appropriate ACC candidate: no, and I have directly contacted and informed primary service, as well as document in record (please see above). Also I have communicated this decision to precare staff scheduler         STOP-BANG Screen (risk for OSA; 0-2 = low; 3-4 = intermediate; 5-8 = high):        Perioperative risk assessment:  Perioperative Cardiac Assessment per 2014 ACC/AHA guidelines.  Active Cardiac Condition: yes  History of ischemic heart disease, History of congestive heart failure   Risk for cardiac death, nonfatal myocardial infarction, and nonfatal cardiac arrest:   5: RCRI (High risk patient)      HPI:  55 y.o. female with 3rd toe amputation unscheduled under Allena Katz DPS here for Pre-op Exam    Past Medical History:   Diagnosis Date   ??? 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 Breast injury     lung surg on left incision under breast sept 2016   ??? Caregiver burden     parents    ??? Hepatitis C antibody test positive 2016    repeatedly negative HCV RNA, indicating clearance of infection w/o treatment   ??? Hyperlipidemia    ??? Hypertension    ??? 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 On home O2 2008    per Dr Mal Amabile note from 02/21/2017   ??? Pulmonary aspergillosis (CMS-HCC) 2016    A.fumigatus in 2016, prior to LUL lobectomy. A.niger from sputum 08/2016-09/2016.   ??? 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 Sarcoidosis 1995   ??? Type 2 diabetes mellitus with hyperglycemia (CMS-HCC) 07/27/2014   ??? Visual impairment     glasses        Past Surgical History:   Procedure Laterality Date   ??? LUNG LOBECTOMY     ??? PR AMPUTATION TOE,MT-P JT Right 12/12/2019    Procedure: Right foot fifth toe amputation at the metatarsophalangeal joint level;  Surgeon: Britt Bottom, DPM;  Location: MAIN OR [facility];  Service: Vascular   ??? PR RIGHT HEART CATH O2 SATURATION & CARDIAC OUTPUT N/A 04/29/2019    Procedure: Right Heart Catheterization;  Surgeon: Rosana Hoes, MD;  Location: [facility] CATH;  Service: Cardiology   ??? PR THORACOSCOPY SURG TOT PULM DECORT Left 12/14/2014    Procedure: THORACOSCOPY SURG; W/TOT PULM DECORTIC/PNEUMOLYS; Surgeon: Evert Kohl, MD;  Location: MAIN OR [facility];  Service: Cardiothoracic   ??? PR THORACOSCOPY W/THERA WEDGE RESEXN INITIAL UNILAT Left 12/03/2014    Procedure: THORACOSCOPY, SURGICAL; WITH THERAPEUTIC WEDGE RESECTION (EG, MASS, NODULE) INITIAL UNILATERAL;  Surgeon: Evert Kohl, MD;  Location: MAIN [facility];  Service: Cardiothoracic       Current Outpatient Medications   Medication Sig Dispense Refill   ??? ACCU-CHEK FASTCLIX LANCET DRUM Misc USE TO CHECK GLUCOSE 4 TIMES DAILY BEFORE MEAL(S) AND NIGHTLY     ??? ACCU-CHEK GUIDE GLUCOSE METER Misc 1 Device by Other route Four (4) times a day (before meals and nightly). AS DIRECTED 1 kit 0   ??? acetaminophen (TYLENOL)  325 MG tablet Take 2 tablets (650 mg total) by mouth every four (4) hours as needed. 30 tablet 1   ??? albuterol 2.5 mg /3 mL (0.083 %) nebulizer solution Inhale 3 mL (2.5 mg total) by nebulization every six (6) hours as needed for wheezing or shortness of breath (airway clearance/cough). 120 mL 0   ??? albuterol HFA 90 mcg/actuation inhaler Inhale 2 puffs every four (4) hours as needed for wheezing or shortness of breath. 24 g 1   ??? amLODIPine (NORVASC) 5 MG tablet Take 5 mg by mouth daily. Added per patient     ??? blood sugar diagnostic Strp by Other route Three (3) times a day. ACCU-CHEK Guide meter 200 strip 2   ??? budesonide-formoteroL (SYMBICORT) 160-4.5 mcg/actuation inhaler Inhale 2 puffs Two (2) times a day. 10.2 g 3   ??? clindamycin (CLEOCIN) 300 MG capsule Take 1 capsule (300 mg total) by mouth Three (3) times a day for 14 days. 42 capsule 0   ??? cyclobenzaprine (FLEXERIL) 10 MG tablet Take 1 tablet by mouth three times daily as needed 90 tablet 0   ??? empty container Misc USE AS DIRECTED 1 each 2   ??? furosemide (LASIX) 20 MG tablet Take 20 mg by mouth daily as needed.     ??? gabapentin (NEURONTIN) 300 MG capsule Take 1 capsule (300 mg total) by mouth Three (3) times a day. 270 capsule 1   ??? hydroCHLOROthiazide (HYDRODIURIL) 25 MG tablet Take 1 tablet by mouth once daily 90 tablet 0   ??? insulin ASPART (NOVOLOG U-100 INSULIN ASPART) 100 unit/mL injection Take 1-20 units SQ QID AC and HS as directed 20 mL 1   ??? insulin glargine (BASAGLAR, LANTUS) 100 unit/mL (3 mL) injection pen Inject 0.24 mL (24 Units total) under the skin daily. 21.6 mL 1   ??? ipratropium (ATROVENT) 0.02 % nebulizer solution Inhale 2.5 mL (500 mcg total) by nebulization Four (4) times a day. (Patient taking differently: Inhale 500 mcg by nebulization Two (2) times a day. ) 900 mL 1   ??? ketoconazole (NIZORAL) 2 % cream Apply 1 application topically Two (2) times a day. 60 g 5   ??? MEDICAL SUPPLY ITEM Accucheck Glide Glocumeter for home glucose checks. Check blood glucose ACHS. 1 Package 0   ??? mepolizumab (NUCALA) 100 mg/mL AtIn Inject the contents of 1 syringe (100 mg) under the skin every twenty-eight (28) days. 1 mL 12   ??? metFORMIN (GLUCOPHAGE) 1000 MG tablet Take 1 tablet (1,000 mg total) by mouth 2 (two) times a day with meals. 180 tablet 1   ??? miscellaneous medical supply Misc Nebulizer machine and tubing. Patients machine is 57 years old. 1 each 0   ??? OXYGEN-AIR DELIVERY SYSTEMS MISC Dose: 2-3 LPM     ??? posaconazole (NOXAFIL) 100 mg delayed released tablet Take 2 tablets (200 mg total) by mouth daily. 60 tablet 2   ??? pravastatin (PRAVACHOL) 40 MG tablet Take 1 tablet (40 mg total) by mouth daily with evening meal. 90 tablet 1   ??? predniSONE (DELTASONE) 5 MG tablet Take 5 mg by mouth daily.     ??? sildenafiL, pulm.hypertension, (REVATIO) 20 mg tablet Take 1 tablet (20 mg total) by mouth Three (3) times a day. 90 tablet 11   ??? treprostiniL (TYVASO) 1.74 mg/2.9 mL (0.6 mg/mL) Nebu Inhale 18 mcg four (4) times a day.       No current facility-administered medications for this visit.     No Known  Allergies    ROS:    10 systems reviewed and negative except:    Constitutional:  Fever, chills, sweats. Normal appetite and weight:   Eyes: Double vision, vision change, eye pain, or eye redness.  Lymph/ENT:  Headache, sore throat, hoarseness, sneezing, nasal discharge, hearing loss, neck pain, stiffness, dizziness, tooth pain, gum pain, or mouth ulcers.  motion sickness. caps/crowns,   partials/dentures   CV:  Pulm:   Skin/Breast:  Rash, sores, jaundice, itching, or dryness.  GI:  Nausea, vomiting, or diarrhea. No melena/hematemesis,dysphagia, or odynophagia. GER or abdominal pain.   GU:  Dysuria or hematuria.   MS: Myalgia, arthralgia, joint swelling or pain.  Neurologic:  Weakness, numbness, change in gait, confusion, or slurred speech.   Endo:   Psychology:  Depression, anxiety, or insomnia.    Heme/Lymph:   Bruising, no nose or gum bleeds.    Pertinent Positives  Const: Denies URI or Flu type symptoms  Cardiovascular: has unchanged, shortness of breath, dyspnea on exertion orthopnea, paroxysmal nocturnal dyspnea, or edema. Pt cannot walk 3 meters without desaturation on oxygen   Pulmonary:  Freq cough (dry or productive), colored sputum, or wheeze. loud continuous snoring.    Endocrine:  No polyuria, polydipsia, skin /hair changes, heat/cold intolerance or excessive fatigue. .   ENT/GI:   Denies motion sickness and GERD    PFH:  Coagulopathies:  Denies           Anesthesia problems:  Denies    PSH:  Tobacco:   Social History     Tobacco Use   Smoking Status Former Smoker   ??? Packs/day: 0.50   ??? Years: 9.00   ??? Pack years: 4.50   ??? Types: Cigarettes   ??? Quit date: 05/03/1987   ??? Years since quitting: 33.1   Smokeless Tobacco Never Used               ETOH:  Denies            Drugs:  Denies    PE:  General:   Alert, well appearing female. No acute distress.     @VSRANGES @    ENT:    Opens mouth > 5 cm. MP 3, TM distance > 6 cm. + sublux. Nares without any discharge, Well hydrated moist oral cavity mucous membrane. Oropharynx without any erythema or exudate.   Lymph/Neck:    Supple without any enlargements or bruit. Full range of motion. Trachea midline.  Circumference = large.  Cardiovascular: Pulse normal rate, regularity and rhythm. S1 and S2 normal, without any murmur, rub, or gallop. Radial pulses 1+ equal on both sides.   Lungs/Pulm:   Fine crackles and coarse crackles in all areas Respirations are labored and significantly reduced.  Skin:   Foot in bandage  Psychiatry:    No anxiety noted   GI/Abdomen:    Normoactive bowel sounds, abdomen soft, non-tender and not distended.   MSK/Extremities:   No bilateral cyanosis, clubbing or edema.   Neurological:    Alert and oriented to person, place, and time. Grips equal and normal gait.     Tests:      CT Chest w Contrast 3d MIPS on 05/25/20  IMPRESSION:   1. Extensive multifocal pulmonary fibrosis has similar distribution to   previous exam of 12/09/2018.   2. Severe bilateral apical bullous disease, slightly increased in size on   the left since prior study. No apparent mycetoma demonstrated on the   current study.   3. Severe  chronic mediastinal and bilateral hilar adenopathy appears   stable.   4. Dilation of main pulmonary artery compatible with chronic pulmonary   artery hypertension,??slightly increased in caliber since 2018.       PET scan 12/09/18  - Metabolically active fibrosis and mediastinal and supraclavicular nodes likely reflect active disease; overall level of avidity is moderate, with uptake similar to liver. Appearance of CT is mildly improved from prior, although substantial fibrosis remains  ??  Right heart Cath on 03/02/20  Last dose of sildenafil was at 4PM yesterday (approx 19 hrs before procedure)     Baseline (on 3L [place])   RA mean 14   RV systolic 88   PA mean 59   Wedge 11   Output 5.5 / Index 2.4   PVR 8.7     With 40ppm NO @5  mins:   RA mean 11   RV systolic 81   PA mean 50   Wedge 11   Output 5.9 / Index 2.6   PVR 6.6       Recommendations:     Salutary response to inhaled NO - does not quite meet classic criteria of >=2mm Hg drop in PA pressure, but she is on sildenafil, suggests potential for vasodilator rx   Plans per pulmonary HTN team      Cardiac cath 04/29/19:  Findings:  1. Mildly elevated right heart filling pressures with normal PCWP.  2. Preserved cardiac output and index.  Mild pulmonary hypertension with mean PA pressure of 33 mmHg.  ??  Echo 02/18/20  NORMAL LEFT VENTRICULAR SYSTOLIC FUNCTION WITH MODERATE LVH   ????ELEVATED LA PRESSURES WITH DIASTOLIC DYSFUNCTION   ????MODERATE RV SYSTOLIC DYSFUNCTION (See above)   ????VALVULAR REGURGITATION: TRIVIAL MR, MILD TR   ????NO VALVULAR STENOSIS   ????TRIVIAL PERICARDIAL EFFUSION   ????NO PRIOR STUDY FOR COMPARISON   ????DEFINITY CONTRAST SHOWS ENHANCED LV BORDERS   ????NEGATIVE SALINE CONTRAST STUDY       Echo 12/30/18:  Summary  ????1. The left ventricle is normal in size with mildly increased wall  thickness.  ????2. Normal left ventricular systolic function, ejection fraction 60-65%.  ????3. Diastolic dysfunction - grade I (normal filling pressures).  ????4. Aortic sclerosis.  ????5. Normal right ventricular size and systolic function.  ????6. Pulmonary systolic pressure cannot be estimated due to insufficient TR  Jet.  ??  SINUS RHYTHM WITH 1ST DEGREE AV BLOCK  LEFT AXIS DEVIATION  RSR' OR QR PATTERN IN V1 SUGGESTS RIGHT VENTRICULAR CONDUCTION DELAY  ABNORMAL ECG  WHEN COMPARED WITH ECG OF 13-Feb-2018 18:02,  T WAVE INVERSION NO LONGER EVIDENT IN ANTERIOR LEADS  Confirmed by Warnell Forester ([id number]) on 04/29/2019 5:36:10 PM     Cliffton Asters Mikel Hardgrove, MD, DPT  Anesthesiology, [facility]  [phone]

## 2020-06-08 NOTE — Unmapped (Addendum)
The night before your surgery do not eat after midnight. Clear liquids that are allowed include only Water, Apple Juice, Gatorade, Sodas, and Black Coffee. Clear liquids must be stopped TWO hours before your scheduled arrival time.      YOUR DAY OF SURGERY ??? DO's  DO bring your identification (driver's license, social security card or Medicare/Medicaid card) and Insurance card.  DO have someone to drive you home.  DO have someone that will stay with you for 24 hours after surgery for outpatient procedures.   DO wear loose, comfortable clothes ??? two piece outfit for breast and eye surgery.  DO leave all valuables at home.  DO brush your teeth.  DO bring any inhalers you use at home.  DO bring your CPAP machine.     YOUR DAY OF SURGERY ??? DO NOT's  DO NOT eat, chew gum, or candy.  DO NOT use lotion, powder, perfume or deodorant.  DO NOT use hairspray, gel or any hair products.  DO NOT shave your surgical area (up to two days before).  DO NOT wear makeup, jewelry (including body piercings), or nail polish  DO NOT smoke, use tobacco, or drink any alcohol for 24 hours before surgery.  DO NOT wear contacts.     Preoperative Showering Instructions:  Before your surgery you can play an important part in your health.  Getting your skin ready before surgery lowers your risk of infection.  You will need to shower with an antibacterial soap, for example, Dial or Safeguard soap that you can buy from a local store.   Follow these instructions the night before AND the morning of surgery:  ??? Wash your hair and face as you normally would.  ??? Rinse Well  ??? Turn off the water and apply soap to your skin from the neck down using a clean bath cloth for approximately 3 minutes. Turn the water back on and rinse the soap off well. Dry off with a clean towel.    ??? Do not shave in the area of your surgery or apply any products to your skin.   ??? Dress in clean clothes    Per Anesthesia's guidelines:    Please take the following medications the morning of your procedure with a sip of water:    Sildenafil  Amlodipine  Tyvaso nebulizer  Atrovent nebulizer  Albuterol inhaler and nebulizer  Gabapentin  Tylenol  Aspirin 81 mg      Do NOT take any insulin on the morning of the procedure and only take one half of your evening insulin the evening before.

## 2020-06-08 NOTE — Unmapped (Signed)
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 Patient's condition was discussed with the patient in detail.  Patient was given opportunity to ask questions.     Patient has ulcers to right foot lateral 3rd toe and medial 4th toe. The 4th toe ulcer is stable. However, the 3rd toe ulcer has worsened and probes to bone with a fibrotic wound base and edema/erythema. No debridement indicated today.     Aerobic/anaerobic culture from 3rd toe ulcer grew 4+ MSSA.    Antibiotics: Continue PO clindamycin 300 mg TID.    Right foot XR from 05/27/20 reviewed in detail showing new erosive changes of 3rd toe middle and proximal phalanges, suspicious of OM, with new nondisplaced, mildly impacted transverse fracture of proximal phalangeal head; focal cortical irregularity of 3rd toe distal phalangeal base laterally, well-corticated and likely chronic.    RLE arterial duplex from 05/28/20 showed runoff obstruction present with no flow detected in PTA but normal ATA and peroneal vessels with great toe pressure of 116 mmHg. Vascular Surgery does not believe a procedure is indicated at this time to facilitate amputation healing.    I recommend we proceed with right 3rd toe amputation at the MPJ level to clear the infection. Patient understands.    Procedure to be performed discussed with the patient in detail included: Right foot 3rd toe amputation at MPJ level, culture, biopsy, and any other procedures indicated intraoperatively  Risks and benefits reviewed with patient in detail. Some of the risks discussed with the patient included pain, burning, bleeding, infection, swelling, blood clots, delayed healing, non healing, need for further surgery, loss of function, loss of limb, loss of life. All questions answered and no guarantees were given or implied. Patient expressed understanding. Written consent was signed, witnessed and placed in patient's chart.      Pre-anesthesia testing, BMP, CBC, and EKG ordered.    Dressings: Dakin's dressings applied. Patient to not apply ketoconazole 2% for now. Patient should continue to change dressings daily and to avoid soaking foot.    Activity: WBAT in [facility] shoe to right foot. .    We discussed strict return precautions to the ED including redness, swelling, purulent drainage, fever, or chills.     RTC in 2nd week of April.     All questions were answered and no guarantees were given or implied. Patient expressed understanding.     History of Present Illness: Patient is a 55 y.o.  female with a history of T2DM, HTN, COPD, hypoxia, sarcoidosis, severe persistent asthma dependent on systemic steroids who returns to the clinic regarding right foot ulcers between 3rd and 4th toes at PIPJ level. She is on PO clindamycin 300 mg TID until 06/14/20.    Today, patient reports that she still has not obtained her diabetic shoes. She notes that her pulmonary status is improving, and she can now walk for 6 minutes without rest. Of note, patient's pulmonary team is through [facility]. Patient  denies any other complaints. Patient denies fever, nausea, vomiting, chills, SOB, chest pain      A1c was 10.1% on 12/12/2019    Past Medical History:   Diagnosis Date   ??? 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 Breast injury     lung surg on left incision under breast sept 2016   ??? Caregiver burden     parents    ??? Hepatitis C antibody test positive 2016    repeatedly negative HCV RNA, indicating clearance of infection w/o treatment   ??? Hyperlipidemia    ??? Hypertension    ??? 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 On home O2 2008    per Dr Mal Amabile note from 02/21/2017   ??? Pulmonary aspergillosis (CMS-HCC) 2016    A.fumigatus in 2016, prior to LUL lobectomy. A.niger from sputum 08/2016-09/2016.   ??? 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 Sarcoidosis 1995   ??? Type 2 diabetes mellitus with hyperglycemia (CMS-HCC) 07/27/2014   ??? Visual impairment     glasses     No Known Allergies     ROS:   Constitutional:  Denies fever or chills   Eyes:  Denies change in visual acuity   HENT:  Denies nasal congestion or sore throat   Respiratory:  Denies cough or shortness of breath   Cardiovascular:  Denies chest pain or edema   GI:  Denies abdominal pain, nausea, vomiting, bloody stools or diarrhea   GU:  Denies dysuria   Musculoskeletal:  Denies back pain or joint pain   Integument:  Denies rash   Neurologic:  Denies headache, focal weakness or sensory changes   Endocrine:  Denies polyuria or polydipsia   Lymphatic:  Denies swollen glands   Psychiatric:  Denies depression or anxiety     Test Results  Imaging: Pending    Physical:   General Appearance: well nourished, Aox3    Vascular: Stable and unchanged.    Skin: Ulcers to right foot lateral 3rd toe and medial 4th toe. The 4th toe ulcer is stable. However, the 3rd toe ulcer has worsened and probes to bone with fibrotic wound base and edema/erythema. Medial 4th toe wound measures 1.0 x 0.9 x 0.1 cm. Lateral 3rd toe wound measures 1.4 x 1.1 x 0.4 cm.     Dry, flaky skin noted to plantar aspect of bilateral feet.   Nails of are elongated, discolored, distrophic with sunungual debris x 10.    Neurological: Protective sensation is diminished bilaterally. Sharp/dull sensation is diminished bilaterally.     MSK: Stable and unchanged.           I personally spent 20 minutes face-to-face and non-face-to-face in the care of this patient, which includes all pre, intra, and post visit time on the date of service.      Scribe's Attestation: Otho Perl, DPM obtained and performed the history, physical exam and medical decision making elements that were  entered into the chart. Documentation assistance was provided by me personally, a scribe. Signed by Azalee Course, Scribe, on June 08, 2020 at 8:08 AM.     Documentation assistance provided by the Scribe. I was present during the time the encounter was recorded. The information recorded by the Scribe was done at my direction and has been reviewed and validated by me.

## 2020-06-08 NOTE — Telephone Encounter (Signed)
Called to check on pt.  Out since 05/13/20.  Left message.

## 2020-06-09 ENCOUNTER — Encounter: Payer: Self-pay | Admitting: *Deleted

## 2020-06-09 DIAGNOSIS — D869 Sarcoidosis, unspecified: Secondary | ICD-10-CM

## 2020-06-09 NOTE — Progress Notes (Signed)
Pulmonary Individual Treatment Plan  Patient Details  Name: Charlotte Cross MRN: [id number] Date of Birth: Jan 30, 1966 Referring Provider:   Flowsheet Row Pulmonary Rehab from 04/13/2020 in [facility] Cardiac and Pulmonary Rehab  Referring Provider Vonita Moss MD      Initial Encounter Date:  Flowsheet Row Pulmonary Rehab from 04/13/2020 in [facility] Cardiac and Pulmonary Rehab  Date 04/13/20      Visit Diagnosis: Sarcoidosis  Patient's Home Medications on Admission:  Current Outpatient Medications:  .  Accu-Chek FastClix Lancets MISC, USE TO CHECK GLUCOSE 4 TIMES DAILY BEFORE MEAL(S) AND NIGHTLY, Disp: , Rfl:  .  acetaminophen (TYLENOL) 325 MG tablet, Take by mouth., Disp: , Rfl:  .  albuterol (PROVENTIL HFA;VENTOLIN HFA) 108 (90 BASE) MCG/ACT inhaler, Inhale 2 puffs into the lungs every 6 (six) hours as needed for wheezing or shortness of breath., Disp: , Rfl:  .  albuterol (PROVENTIL) (2.5 MG/3ML) 0.083% nebulizer solution, Inhale 2.5 mg into the lungs every 6 (six) hours as needed for wheezing or shortness of breath. , Disp: , Rfl:  .  albuterol (VENTOLIN HFA) 108 (90 Base) MCG/ACT inhaler, Inhale into the lungs. (Patient not taking: Reported on 03/30/2020), Disp: , Rfl:  .  amLODipine (NORVASC) 5 MG tablet, Take by mouth., Disp: , Rfl:  .  aspirin EC 325 MG tablet, Take 325 mg by mouth daily., Disp: , Rfl:  .  Blood Glucose Monitoring Suppl (ACCU-CHEK GUIDE) w/Device KIT, 1 Device by Other route Four (4) times a day (before meals and nightly). AS DIRECTED, Disp: , Rfl:  .  budesonide (PULMICORT) 0.5 MG/2ML nebulizer solution, Inhale 2 mLs (0.5 mg total) into the lungs every 12 (twelve) hours., Disp: 120 mL, Rfl: 0 .  budesonide-formoterol (SYMBICORT) 160-4.5 MCG/ACT inhaler, Inhale into the lungs., Disp: , Rfl:  .  cyclobenzaprine (FLEXERIL) 10 MG tablet, Take 10 mg by mouth 3 (three) times daily as needed for muscle spasms. , Disp: , Rfl:  .  feeding supplement, ENSURE ENLIVE, (ENSURE  ENLIVE) LIQD, Take 237 mLs by mouth daily. (Patient not taking: Reported on 03/30/2020), Disp: 237 mL, Rfl: 12 .  furosemide (LASIX) 20 MG tablet, Take by mouth., Disp: , Rfl:  .  gabapentin (NEURONTIN) 300 MG capsule, Take 300 mg by mouth 3 (three) times daily., Disp: , Rfl:  .  glucose blood (PRECISION QID TEST) test strip, by Other route Three (3) times a day. ACCU-CHEK Guide meter, Disp: , Rfl:  .  hydrochlorothiazide (HYDRODIURIL) 25 MG tablet, Take 25 mg by mouth daily., Disp: , Rfl:  .  insulin aspart (NOVOLOG) 100 UNIT/ML injection, INJECT 1 TO 20 UNIT(S) SUBCUTANEOUSLY 4 TIMES DAILY BEFORE MEALS AND AT BEDTIME AS DIRECTED, Disp: , Rfl:  .  Insulin Glargine (BASAGLAR KWIKPEN) 100 UNIT/ML, 18 Units nightly, Disp: , Rfl:  .  ipratropium (ATROVENT) 0.02 % nebulizer solution, 2 (two) times daily, Disp: , Rfl:  .  ketoconazole (NIZORAL) 2 % cream, Apply topically., Disp: , Rfl:  .  levofloxacin (LEVAQUIN) 750 MG tablet, Take 1 tablet (750 mg total) by mouth daily. (Patient not taking: Reported on 03/30/2020), Disp: 5 tablet, Rfl: 0 .  lisinopril (PRINIVIL,ZESTRIL) 10 MG tablet, Take 10 mg by mouth daily.  (Patient not taking: Reported on 03/30/2020), Disp: , Rfl:  .  Mepolizumab (NUCALA) 100 MG/ML SOAJ, Take by mouth., Disp: , Rfl:  .  metFORMIN (GLUCOPHAGE) 1000 MG tablet, Take 1,000 mg by mouth 2 (two) times daily with a meal., Disp: , Rfl:  .  posaconazole (  NOXAFIL) 100 MG TBEC delayed-release tablet, Take by mouth., Disp: , Rfl:  .  pravastatin (PRAVACHOL) 40 MG tablet, Take 40 mg by mouth at bedtime., Disp: , Rfl:  .  sildenafil (REVATIO) 20 MG tablet, Take by mouth., Disp: , Rfl:   Past Medical History: Past Medical History:  Diagnosis Date  . 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 COPD (chronic obstructive pulmonary disease) ([place])   . Diabetes mellitus without complication ([place])   . Hypertension   . Sarcoidosis     Tobacco Use: Social History   Tobacco Use  Smoking Status Former Smoker  .  Packs/day: 0.25  . Years: 6.00  . Pack years: 1.50  . Types: Cigarettes  . Quit date: 03/21/1987  . Years since quitting: 33.2  Smokeless Tobacco Never Used    Labs: Recent Review Scientist, physiological    Labs for ITP Cardiac and Pulmonary Rehab Latest Ref Rng & Units 12/24/2014 08/26/2016   Hemoglobin A1c 4.8 - 5.6 % - 12.3(H)   HCO3 21.0 - 28.0 mEq/L 29.2(H) -       Pulmonary Assessment Scores:  Pulmonary Assessment Scores    Row Name 04/13/20 1556         ADL UCSD   ADL Phase Entry     SOB Score total 79     Rest 3     Walk 4     Stairs 5     Bath 3     Dress 3     Shop 4           CAT Score   CAT Score 29           mMRC Score   mMRC Score 4            UCSD: Self-administered rating of dyspnea associated with activities of daily living (ADLs) 6-point scale (0 = "not at all" to 5 = "maximal or unable to do because of breathlessness")  Scoring Scores range from 0 to 120.  Minimally important difference is 5 units  CAT: CAT can identify the health impairment of COPD patients and is better correlated with disease progression.  CAT has a scoring range of zero to 40. The CAT score is classified into four groups of low (less than 10), medium (10 - 20), high (21-30) and very high (31-40) based on the impact level of disease on health status. A CAT score over 10 suggests significant symptoms.  A worsening CAT score could be explained by an exacerbation, poor medication adherence, poor inhaler technique, or progression of COPD or comorbid conditions.  CAT MCID is 2 points  mMRC: mMRC (Modified Medical Research Council) Dyspnea Scale is used to assess the degree of baseline functional disability in patients of respiratory disease due to dyspnea. No minimal important difference is established. A decrease in score of 1 point or greater is considered a positive change.   Pulmonary Function Assessment:  Pulmonary Function Assessment - 04/13/20 1556      Breath   Shortness of  Breath Yes;Fear of Shortness of Breath;Limiting activity;Panic with Shortness of Breath           Exercise Target Goals: Exercise Program Goal: Individual exercise prescription set using results from initial 6 min walk test and THRR while considering  patient's activity barriers and safety.   Exercise Prescription Goal: Initial exercise prescription builds to 30-45 minutes a day of aerobic activity, 2-3 days per week.  Home exercise guidelines will be given to patient during program as part  of exercise prescription that the participant will acknowledge.  Education: Aerobic Exercise: - Group verbal and visual presentation on the components of exercise prescription. Introduces F.I.T.T principle from ACSM for exercise prescriptions.  Reviews F.I.T.T. principles of aerobic exercise including progression. Written material given at graduation.   Education: Resistance Exercise: - Group verbal and visual presentation on the components of exercise prescription. Introduces F.I.T.T principle from ACSM for exercise prescriptions  Reviews F.I.T.T. principles of resistance exercise including progression. Written material given at graduation. Flowsheet Row Pulmonary Rehab from 05/12/2020 in [facility] Cardiac and Pulmonary Rehab  Date 05/12/20  Educator AS  Instruction Review Code 1- Verbalizes Understanding       Education: Exercise & Equipment Safety: - Individual verbal instruction and demonstration of equipment use and safety with use of the equipment. Flowsheet Row Pulmonary Rehab from 05/12/2020 in [facility] Cardiac and Pulmonary Rehab  Date 03/30/20  Educator [facility]  Instruction Review Code 1- Verbalizes Understanding      Education: Exercise Physiology & General Exercise Guidelines: - Group verbal and written instruction with models to review the exercise physiology of the cardiovascular system and associated critical values. Provides general exercise guidelines with specific guidelines to those with  heart or lung disease.    Education: Flexibility, Balance, Mind/Body Relaxation: - Group verbal and visual presentation with interactive activity on the components of exercise prescription. Introduces F.I.T.T principle from ACSM for exercise prescriptions. Reviews F.I.T.T. principles of flexibility and balance exercise training including progression. Also discusses the mind body connection.  Reviews various relaxation techniques to help reduce and manage stress (i.e. Deep breathing, progressive muscle relaxation, and visualization). Balance handout provided to take home. Written material given at graduation.   Activity Barriers & Risk Stratification:  Activity Barriers & Cardiac Risk Stratification - 04/13/20 1550      Activity Barriers & Cardiac Risk Stratification   Activity Barriers Deconditioning;Muscular Weakness;Shortness of Breath;Assistive Device;Other (comment)    Comments uses scooter now due to SOB and fatigue           6 Minute Walk:  6 Minute Walk    Row Name 04/13/20 1545         6 Minute Walk   Phase Initial     Distance 270 feet     Walk Time 2 minutes     # of Rest Breaks 2  2:47, 1:23     MPH 1.53     METS 2.06     RPE 20     Perceived Dyspnea  4     VO2 Peak 7.2     Symptoms Yes (comment)     Comments SOB, fatigue, chest tightness from breathing     Resting HR 94 bpm  initially 77% on low tank     Resting BP 132/78     Resting Oxygen Saturation  99 %     Exercise Oxygen Saturation  during 6 min walk 84 %     Max Ex. HR 125 bpm     Max Ex. BP 152/84     2 Minute Post BP 132/76           Interval HR   1 Minute HR 112     2 Minute HR 123     3 Minute HR 118     4 Minute HR 121     5 Minute HR 122     6 Minute HR 125     2 Minute Post HR 114     Interval Heart Rate?  Yes           Interval Oxygen   Interval Oxygen? Yes     Baseline Oxygen Saturation % 99 %     1 Minute Oxygen Saturation % 98 %     1 Minute Liters of Oxygen 6 L  continuous  e-tank     2 Minute Oxygen Saturation % 86 %  seated     2 Minute Liters of Oxygen 6 L     3 Minute Oxygen Saturation % 88 %  seated     3 Minute Liters of Oxygen 6 L     4 Minute Oxygen Saturation % 93 %     4 Minute Liters of Oxygen 6 L     5 Minute Oxygen Saturation % 90 %  seated     5 Minute Liters of Oxygen 6 L     6 Minute Oxygen Saturation % 87 %  low at 84% at 2mn     6 Minute Liters of Oxygen 6 L     2 Minute Post Oxygen Saturation % 91 %     2 Minute Post Liters of Oxygen 6 L           Oxygen Initial Assessment:  Oxygen Initial Assessment - 04/13/20 1555      Home Oxygen   Home Oxygen Device Home Concentrator;Portable Concentrator;E-Tanks    Sleep Oxygen Prescription Continuous    Liters per minute 5    Home Exercise Oxygen Prescription Continuous    Liters per minute 5    Home Resting Oxygen Prescription Continuous    Liters per minute 5    Compliance with Home Oxygen Use Yes      Initial 6 min Walk   Oxygen Used Continuous;E-Tanks    Liters per minute 6      Program Oxygen Prescription   Program Oxygen Prescription Continuous;E-Tanks    Liters per minute 6      Intervention   Short Term Goals To learn and exhibit compliance with exercise, home and travel O2 prescription;To learn and understand importance of monitoring SPO2 with pulse oximeter and demonstrate accurate use of the pulse oximeter.;To learn and understand importance of maintaining oxygen saturations>88%;To learn and demonstrate proper pursed lip breathing techniques or other breathing techniques.;To learn and demonstrate proper use of respiratory medications    Long  Term Goals Exhibits compliance with exercise, home and travel O2 prescription;Verbalizes importance of monitoring SPO2 with pulse oximeter and return demonstration;Maintenance of O2 saturations>88%;Exhibits proper breathing techniques, such as pursed lip breathing or other method taught during program session;Compliance with respiratory  medication;Demonstrates proper use of MDI's           Oxygen Re-Evaluation:  Oxygen Re-Evaluation    Row Name 04/29/20 1414             Program Oxygen Prescription   Program Oxygen Prescription Continuous;E-Tanks       Liters per minute 6               Home Oxygen   Sleep Oxygen Prescription Continuous       Liters per minute 5       Home Exercise Oxygen Prescription Continuous       Liters per minute 5       Home Resting Oxygen Prescription Continuous       Liters per minute 5       Compliance with Home Oxygen Use Yes  Goals/Expected Outcomes   Short Term Goals To learn and exhibit compliance with exercise, home and travel O2 prescription;To learn and understand importance of monitoring SPO2 with pulse oximeter and demonstrate accurate use of the pulse oximeter.;To learn and understand importance of maintaining oxygen saturations>88%;To learn and demonstrate proper pursed lip breathing techniques or other breathing techniques.;To learn and demonstrate proper use of respiratory medications       Long  Term Goals Exhibits compliance with exercise, home and travel O2 prescription;Verbalizes importance of monitoring SPO2 with pulse oximeter and return demonstration;Maintenance of O2 saturations>88%;Exhibits proper breathing techniques, such as pursed lip breathing or other method taught during program session;Compliance with respiratory medication;Demonstrates proper use of MDI's       Comments Reviewed PLB technique with pt.  Talked about how it works and it's importance in maintaining their exercise saturations.       Goals/Expected Outcomes Short: Become more profiecient at using PLB.   Long: Become independent at using PLB.              Oxygen Discharge (Final Oxygen Re-Evaluation):  Oxygen Re-Evaluation - 04/29/20 1414      Program Oxygen Prescription   Program Oxygen Prescription Continuous;E-Tanks    Liters per minute 6      Home Oxygen   Sleep Oxygen  Prescription Continuous    Liters per minute 5    Home Exercise Oxygen Prescription Continuous    Liters per minute 5    Home Resting Oxygen Prescription Continuous    Liters per minute 5    Compliance with Home Oxygen Use Yes      Goals/Expected Outcomes   Short Term Goals To learn and exhibit compliance with exercise, home and travel O2 prescription;To learn and understand importance of monitoring SPO2 with pulse oximeter and demonstrate accurate use of the pulse oximeter.;To learn and understand importance of maintaining oxygen saturations>88%;To learn and demonstrate proper pursed lip breathing techniques or other breathing techniques.;To learn and demonstrate proper use of respiratory medications    Long  Term Goals Exhibits compliance with exercise, home and travel O2 prescription;Verbalizes importance of monitoring SPO2 with pulse oximeter and return demonstration;Maintenance of O2 saturations>88%;Exhibits proper breathing techniques, such as pursed lip breathing or other method taught during program session;Compliance with respiratory medication;Demonstrates proper use of MDI's    Comments Reviewed PLB technique with pt.  Talked about how it works and it's importance in maintaining their exercise saturations.    Goals/Expected Outcomes Short: Become more profiecient at using PLB.   Long: Become independent at using PLB.           Initial Exercise Prescription:  Initial Exercise Prescription - 04/13/20 1500      Date of Initial Exercise RX and Referring Provider   Date 04/13/20    Referring Provider Vonita Moss MD      Oxygen   Oxygen Continuous    Liters 6      Recumbant Bike   Level 1    RPM 50    Watts 10    Minutes 15    METs 2      NuStep   Level 1    SPM 80    Minutes 15    METs 2      Arm Ergometer   Level 1    RPM 30    Minutes 15    METs 2      Biostep-RELP   Level 1    SPM 50    Minutes 15  METs 2      Prescription Details   Frequency  (times per week) 3    Duration Progress to 30 minutes of continuous aerobic without signs/symptoms of physical distress      Intensity   THRR 40-80% of Max Heartrate 122-150    Ratings of Perceived Exertion 11-13    Perceived Dyspnea 0-4      Progression   Progression Continue to progress workloads to maintain intensity without signs/symptoms of physical distress.      Resistance Training   Training Prescription Yes    Weight 3 lb    Reps 10-15           Perform Capillary Blood Glucose checks as needed.  Exercise Prescription Changes:  Exercise Prescription Changes    Row Name 04/13/20 1500 05/12/20 1000           Response to Exercise   Blood Pressure (Admit) 132/78 132/64      Blood Pressure (Exercise) 152/84 124/64      Blood Pressure (Exit) 112/60 102/64      Heart Rate (Admit) 94 bpm 100 bpm      Heart Rate (Exercise) 1255 bpm 108 bpm      Heart Rate (Exit) 104 bpm 93 bpm      Oxygen Saturation (Admit) 99 % 90 %      Oxygen Saturation (Exercise) 84 % 89 %      Oxygen Saturation (Exit) 97 % 95 %      Rating of Perceived Exertion (Exercise) 20 17      Perceived Dyspnea (Exercise) 4 3      Symptoms SOB, fatigue SOB, fatigue      Comments walk test results --      Duration -- Progress to 30 minutes of  aerobic without signs/symptoms of physical distress      Intensity -- THRR unchanged             Progression   Progression -- Continue to progress workloads to maintain intensity without signs/symptoms of physical distress.      Average METs -- 2.35             Resistance Training   Training Prescription -- Yes      Weight -- 3 lb      Reps -- 10-15             Oxygen   Oxygen -- Continuous      Liters -- 6             NuStep   Level -- 1      Minutes -- 15      METs -- 2.7             Biostep-RELP   Level -- 1      Minutes -- 15      METs -- 2             Exercise Comments:   Exercise Goals and Review:  Exercise Goals    Row Name  04/13/20 1552             Exercise Goals   Increase Physical Activity Yes       Intervention Provide advice, education, support and counseling about physical activity/exercise needs.;Develop an individualized exercise prescription for aerobic and resistive training based on initial evaluation findings, risk stratification, comorbidities and participant's personal goals.       Expected Outcomes Short Term: Attend rehab on a regular basis to increase amount of physical  activity.;Long Term: Add in home exercise to make exercise part of routine and to increase amount of physical activity.;Long Term: Exercising regularly at least 3-5 days a week.       Increase Strength and Stamina Yes       Intervention Provide advice, education, support and counseling about physical activity/exercise needs.;Develop an individualized exercise prescription for aerobic and resistive training based on initial evaluation findings, risk stratification, comorbidities and participant's personal goals.       Expected Outcomes Short Term: Increase workloads from initial exercise prescription for resistance, speed, and METs.;Short Term: Perform resistance training exercises routinely during rehab and add in resistance training at home;Long Term: Improve cardiorespiratory fitness, muscular endurance and strength as measured by increased METs and functional capacity (6MWT)       Able to understand and use rate of perceived exertion (RPE) scale Yes       Intervention Provide education and explanation on how to use RPE scale       Expected Outcomes Short Term: Able to use RPE daily in rehab to express subjective intensity level;Long Term:  Able to use RPE to guide intensity level when exercising independently       Able to understand and use Dyspnea scale Yes       Intervention Provide education and explanation on how to use Dyspnea scale       Expected Outcomes Short Term: Able to use Dyspnea scale daily in rehab to express  subjective sense of shortness of breath during exertion;Long Term: Able to use Dyspnea scale to guide intensity level when exercising independently       Knowledge and understanding of Target Heart Rate Range (THRR) Yes       Intervention Provide education and explanation of THRR including how the numbers were predicted and where they are located for reference       Expected Outcomes Short Term: Able to state/look up THRR;Long Term: Able to use THRR to govern intensity when exercising independently;Short Term: Able to use daily as guideline for intensity in rehab       Able to check pulse independently Yes       Intervention Provide education and demonstration on how to check pulse in carotid and radial arteries.;Review the importance of being able to check your own pulse for safety during independent exercise       Expected Outcomes Short Term: Able to explain why pulse checking is important during independent exercise;Long Term: Able to check pulse independently and accurately       Understanding of Exercise Prescription Yes       Intervention Provide education, explanation, and written materials on patient's individual exercise prescription       Expected Outcomes Short Term: Able to explain program exercise prescription;Long Term: Able to explain home exercise prescription to exercise independently              Exercise Goals Re-Evaluation :  Exercise Goals Re-Evaluation    [place] Name 04/29/20 1413 05/12/20 1042 05/13/20 1445 05/26/20 1414 06/08/20 1554     Exercise Goal Re-Evaluation   Exercise Goals Review Increase Physical Activity;Able to understand and use rate of perceived exertion (RPE) scale;Knowledge and understanding of Target Heart Rate Range (THRR);Understanding of Exercise Prescription;Increase Strength and Stamina;Able to understand and use Dyspnea scale;Able to check pulse independently Increase Physical Activity;Increase Strength and Stamina;Understanding of Exercise  Prescription Increase Physical Activity;Increase Strength and Stamina -- --   Comments Reviewed RPE and dyspnea scales, THR and program prescription with  pt today.  Pt voiced understanding and was given a copy of goals to take home. Charlotte Cross has completed her first two full days of exercise.  She has had some other things come up, including her sugars, that have interferred with her attendance.  We will contiue to monitor her progress and encourage improved attendance. Pt able to follow exercise prescription today without complaint.  Will continue to monitor for progression.  Reviewed home exercise with pt today.  Pt plans to use bike/arm machine at home for exercise.  Reviewed THR, pulse, RPE, sign and symptoms, pulse oximetery and when to call 911 or MD.  Also discussed weather considerations and indoor options.  Pt voiced understanding. Called to check on Charlotte Cross.  She has been having a hard time with her feet being swollen and possibly around her lungs as well.  She has started some fluid medication and to help with swelling.  We talked about keeping feet elevated when not moving.  She has a follow up call tomorrow and possible appt on Friday.  We talked about returning to rehab next week to keep moving and fluid moving. Out since last review   Expected Outcomes Short: Use RPE daily to regulate intensity. Long: Follow program prescription in THR. Short: Improved reguarly attendance Long: Continue to follow program prescription. Short :  monitor HR and oxygen when exercising at home Long:  improve overall MET level -- --          Discharge Exercise Prescription (Final Exercise Prescription Changes):  Exercise Prescription Changes - 05/12/20 1000      Response to Exercise   Blood Pressure (Admit) 132/64    Blood Pressure (Exercise) 124/64    Blood Pressure (Exit) 102/64    Heart Rate (Admit) 100 bpm    Heart Rate (Exercise) 108 bpm    Heart Rate (Exit) 93 bpm    Oxygen Saturation (Admit) 90 %     Oxygen Saturation (Exercise) 89 %    Oxygen Saturation (Exit) 95 %    Rating of Perceived Exertion (Exercise) 17    Perceived Dyspnea (Exercise) 3    Symptoms SOB, fatigue    Duration Progress to 30 minutes of  aerobic without signs/symptoms of physical distress    Intensity THRR unchanged      Progression   Progression Continue to progress workloads to maintain intensity without signs/symptoms of physical distress.    Average METs 2.35      Resistance Training   Training Prescription Yes    Weight 3 lb    Reps 10-15      Oxygen   Oxygen Continuous    Liters 6      NuStep   Level 1    Minutes 15    METs 2.7      Biostep-RELP   Level 1    Minutes 15    METs 2           Nutrition:  Target Goals: Understanding of nutrition guidelines, daily intake of sodium <1571m, cholesterol <2029m calories 30% from fat and 7% or less from saturated fats, daily to have 5 or more servings of fruits and vegetables.  Education: All About Nutrition: -Group instruction provided by verbal, written material, interactive activities, discussions, models, and posters to present general guidelines for heart healthy nutrition including fat, fiber, MyPlate, the role of sodium in heart healthy nutrition, utilization of the nutrition label, and utilization of this knowledge for meal planning. Follow up email sent as well. Written material given  at graduation. Flowsheet Row Pulmonary Rehab from 05/12/2020 in [facility] Cardiac and Pulmonary Rehab  Education need identified 04/13/20      Biometrics:  Pre Biometrics - 04/13/20 1553      Pre Biometrics   Height 5' 11.1" (1.806 m)    Weight 232 lb 4.8 oz (105.4 kg)    BMI (Calculated) 32.31    Single Leg Stand --   did not do due to SOB/fatigue           Nutrition Therapy Plan and Nutrition Goals:   Nutrition Assessments:  MEDIFICTS Score Key:  ?70 Need to make dietary changes   40-70 Heart Healthy Diet  ? 40 Therapeutic Level Cholesterol  Diet  Flowsheet Row Pulmonary Rehab from 04/13/2020 in [facility] Cardiac and Pulmonary Rehab  Picture Your Plate Total Score on Admission 54     Picture Your Plate Scores:  <01 Unhealthy dietary pattern with much room for improvement.  41-50 Dietary pattern unlikely to meet recommendations for good health and room for improvement.  51-60 More healthful dietary pattern, with some room for improvement.   >60 Healthy dietary pattern, although there may be some specific behaviors that could be improved.   Nutrition Goals Re-Evaluation:   Nutrition Goals Discharge (Final Nutrition Goals Re-Evaluation):   Psychosocial: Target Goals: Acknowledge presence or absence of significant depression and/or stress, maximize coping skills, provide positive support system. Participant is able to verbalize types and ability to use techniques and skills needed for reducing stress and depression.   Education: Stress, Anxiety, and Depression - Group verbal and visual presentation to define topics covered.  Reviews how body is impacted by stress, anxiety, and depression.  Also discusses healthy ways to reduce stress and to treat/manage anxiety and depression.  Written material given at graduation.   Education: Sleep Hygiene -Provides group verbal and written instruction about how sleep can affect your health.  Define sleep hygiene, discuss sleep cycles and impact of sleep habits. Review good sleep hygiene tips.    Initial Review & Psychosocial Screening:  Initial Psych Review & Screening - 03/30/20 0945      Initial Review   Current issues with Current Stress Concerns    Source of Stress Concerns Chronic Illness      Family Dynamics   Good Support System? Yes    Comments Charlotte Cross can look to her parents and brother for support. She states that she has no stress concerns other than she cannot get around and is limited to doing daily activities.      Barriers   Psychosocial barriers to participate in  program The patient should benefit from training in stress management and relaxation.      Screening Interventions   Interventions To provide support and resources with identified psychosocial needs;Provide feedback about the scores to participant;Encouraged to exercise    Expected Outcomes Short Term goal: Utilizing psychosocial counselor, staff and physician to assist with identification of specific Stressors or current issues interfering with healing process. Setting desired goal for each stressor or current issue identified.;Long Term Goal: Stressors or current issues are controlled or eliminated.;Short Term goal: Identification and review with participant of any Quality of Life or Depression concerns found by scoring the questionnaire.;Long Term goal: The participant improves quality of Life and PHQ9 Scores as seen by post scores and/or verbalization of changes           Quality of Life Scores:  Scores of 19 and below usually indicate a poorer quality of life in these  areas.  A difference of  2-3 points is a clinically meaningful difference.  A difference of 2-3 points in the total score of the Quality of Life Index has been associated with significant improvement in overall quality of life, self-image, physical symptoms, and general health in studies assessing change in quality of life.  PHQ-9: Recent Review Flowsheet Data    Depression screen [facility] 2/9 04/13/2020 03/06/2017   Decreased Interest 3 0   Down, Depressed, Hopeless 1 0   PHQ - 2 Score 4 0   Altered sleeping 3 3   Tired, decreased energy 3 3   Change in appetite 0 2   Feeling bad or failure about yourself  0 0   Trouble concentrating 0 0   Moving slowly or fidgety/restless 0 0   Suicidal thoughts 0 0   PHQ-9 Score 10 8   Difficult doing work/chores Very difficult Somewhat difficult     Interpretation of Total Score  Total Score Depression Severity:  1-4 = Minimal depression, 5-9 = Mild depression, 10-14 = Moderate  depression, 15-19 = Moderately severe depression, 20-27 = Severe depression   Psychosocial Evaluation and Intervention:  Psychosocial Evaluation - 03/30/20 0947      Psychosocial Evaluation & Interventions   Interventions Stress management education;Relaxation education;Encouraged to exercise with the program and follow exercise prescription    Comments Charlotte Cross can look to her parents and brother for support. She states that she has no stress concerns other than she cannot get around and is limited to doing daily activities.    Expected Outcomes Short: Exercise regularly to support mental health and notify staff of any changes. Long: maintain mental health and well being through teaching of rehab or prescribed medications independently.    Continue Psychosocial Services  Follow up required by staff           Psychosocial Re-Evaluation:   Psychosocial Discharge (Final Psychosocial Re-Evaluation):   Education: Education Goals: Education classes will be provided on a weekly basis, covering required topics. Participant will state understanding/return demonstration of topics presented.  Learning Barriers/Preferences:  Learning Barriers/Preferences - 03/30/20 0944      Learning Barriers/Preferences   Learning Barriers None    Learning Preferences None           General Pulmonary Education Topics:  Infection Prevention: - Provides verbal and written material to individual with discussion of infection control including proper hand washing and proper equipment cleaning during exercise session. Flowsheet Row Pulmonary Rehab from 05/12/2020 in [facility] Cardiac and Pulmonary Rehab  Date 03/30/20  Educator [facility]  Instruction Review Code 1- Verbalizes Understanding      Falls Prevention: - Provides verbal and written material to individual with discussion of falls prevention and safety. Flowsheet Row Pulmonary Rehab from 05/12/2020 in [facility] Cardiac and Pulmonary Rehab  Date 03/30/20   Educator [facility]  Instruction Review Code 1- Verbalizes Understanding      Chronic Lung Disease Review: - Group verbal instruction with posters, models, PowerPoint presentations and videos,  to review new updates, new respiratory medications, new advancements in procedures and treatments. Providing information on websites and "800" numbers for continued self-education. Includes information about supplement oxygen, available portable oxygen systems, continuous and intermittent flow rates, oxygen safety, concentrators, and Medicare reimbursement for oxygen. Explanation of Pulmonary Drugs, including class, frequency, complications, importance of spacers, rinsing mouth after steroid MDI's, and proper cleaning methods for nebulizers. Review of basic lung anatomy and physiology related to function, structure, and complications of lung disease. Review of  risk factors. Discussion about methods for diagnosing sleep apnea and types of masks and machines for OSA. Includes a review of the use of types of environmental controls: home humidity, furnaces, filters, dust mite/pet prevention, HEPA vacuums. Discussion about weather changes, air quality and the benefits of nasal washing. Instruction on Warning signs, infection symptoms, calling MD promptly, preventive modes, and value of vaccinations. Review of effective airway clearance, coughing and/or vibration techniques. Emphasizing that all should Create an Action Plan. Written material given at graduation. Flowsheet Row Pulmonary Rehab from 05/12/2020 in [facility] Cardiac and Pulmonary Rehab  Education need identified 04/13/20      AED/CPR: - Group verbal and written instruction with the use of models to demonstrate the basic use of the AED with the basic ABC's of resuscitation.    Anatomy and Cardiac Procedures: - Group verbal and visual presentation and models provide information about basic cardiac anatomy and function. Reviews the testing methods done to diagnose  heart disease and the outcomes of the test results. Describes the treatment choices: Medical Management, Angioplasty, or Coronary Bypass Surgery for treating various heart conditions including Myocardial Infarction, Angina, Valve Disease, and Cardiac Arrhythmias.  Written material given at graduation. Flowsheet Row Pulmonary Rehab from 05/12/2020 in [facility] Cardiac and Pulmonary Rehab  Date 05/12/20  Educator [facility]  Instruction Review Code 1- Verbalizes Understanding      Medication Safety: - Group verbal and visual instruction to review commonly prescribed medications for heart and lung disease. Reviews the medication, class of the drug, and side effects. Includes the steps to properly store meds and maintain the prescription regimen.  Written material given at graduation.   Other: -Provides group and verbal instruction on various topics (see comments)   Knowledge Questionnaire Score:  Knowledge Questionnaire Score - 04/13/20 1554      Knowledge Questionnaire Score   Pre Score 16/18 Education Focus: Nutriton and O2 safety            Core Components/Risk Factors/Patient Goals at Admission:  Personal Goals and Risk Factors at Admission - 04/13/20 1554      Core Components/Risk Factors/Patient Goals on Admission    Weight Management Yes;Weight Loss;Obesity    Intervention Weight Management: Develop a combined nutrition and exercise program designed to reach desired caloric intake, while maintaining appropriate intake of nutrient and fiber, sodium and fats, and appropriate energy expenditure required for the weight goal.;Weight Management: Provide education and appropriate resources to help participant work on and attain dietary goals.;Weight Management/Obesity: Establish reasonable short term and long term weight goals.;Obesity: Provide education and appropriate resources to help participant work on and attain dietary goals.    Admit Weight 232 lb 4.8 oz (105.4 kg)    Goal Weight: Short Term  225 lb (102.1 kg)    Goal Weight: Long Term 220 lb (99.8 kg)    Expected Outcomes Short Term: Continue to assess and modify interventions until short term weight is achieved;Long Term: Adherence to nutrition and physical activity/exercise program aimed toward attainment of established weight goal;Weight Loss: Understanding of general recommendations for a balanced deficit meal plan, which promotes 1-2 lb weight loss per week and includes a negative energy balance of [phone] kcal/d;Understanding recommendations for meals to include 15-35% energy as protein, 25-35% energy from fat, 35-60% energy from carbohydrates, less than 242m of dietary cholesterol, 20-35 gm of total fiber daily;Understanding of distribution of calorie intake throughout the day with the consumption of 4-5 meals/snacks    Improve shortness of breath with ADL's Yes  Intervention Provide education, individualized exercise plan and daily activity instruction to help decrease symptoms of SOB with activities of daily living.    Expected Outcomes Short Term: Improve cardiorespiratory fitness to achieve a reduction of symptoms when performing ADLs;Long Term: Be able to perform more ADLs without symptoms or delay the onset of symptoms    Diabetes Yes    Intervention Provide education about signs/symptoms and action to take for hypo/hyperglycemia.;Provide education about proper nutrition, including hydration, and aerobic/resistive exercise prescription along with prescribed medications to achieve blood glucose in normal ranges: Fasting glucose 65-99 mg/dL    Expected Outcomes Short Term: Participant verbalizes understanding of the signs/symptoms and immediate care of hyper/hypoglycemia, proper foot care and importance of medication, aerobic/resistive exercise and nutrition plan for blood glucose control.;Long Term: Attainment of HbA1C < 7%.    Hypertension Yes    Intervention Provide education on lifestyle modifcations including regular  physical activity/exercise, weight management, moderate sodium restriction and increased consumption of fresh fruit, vegetables, and low fat dairy, alcohol moderation, and smoking cessation.;Monitor prescription use compliance.    Expected Outcomes Long Term: Maintenance of blood pressure at goal levels.;Short Term: Continued assessment and intervention until BP is < 140/24m HG in hypertensive participants. < 130/849mHG in hypertensive participants with diabetes, heart failure or chronic kidney disease.    Lipids Yes    Intervention Provide education and support for participant on nutrition & aerobic/resistive exercise along with prescribed medications to achieve LDL <7062mHDL >67m57m  Expected Outcomes Short Term: Participant states understanding of desired cholesterol values and is compliant with medications prescribed. Participant is following exercise prescription and nutrition guidelines.;Long Term: Cholesterol controlled with medications as prescribed, with individualized exercise RX and with personalized nutrition plan. Value goals: LDL < 70mg65mL > 40 mg.           Education:Diabetes - Individual verbal and written instruction to review signs/symptoms of diabetes, desired ranges of glucose level fasting, after meals and with exercise. Acknowledge that pre and post exercise glucose checks will be done for 3 sessions at entry of program. Flowsheet Row Pulmonary Rehab from 05/12/2020 in [facility] and Pulmonary Rehab  Date 04/13/20  Educator [facility] Inctruction Review Code 1- Verbalizes Understanding      Know Your Numbers and Heart Failure: - Group verbal and visual instruction to discuss disease risk factors for cardiac and pulmonary disease and treatment options.  Reviews associated critical values for Overweight/Obesity, Hypertension, Cholesterol, and Diabetes.  Discusses basics of heart failure: signs/symptoms and treatments.  Introduces Heart Failure Zone chart for action plan for  heart failure.  Written material given at graduation.   Core Components/Risk Factors/Patient Goals Review:    Core Components/Risk Factors/Patient Goals at Discharge (Final Review):    ITP Comments:  ITP Comments    Row Name 03/30/20 0941 04/13/20 1544 04/14/20 0937 04/29/20 1411 05/12/20 0615   ITP Comments Virtual Visit completed. Patient informed on EP and RD appointment and 6 Minute walk test. Patient also informed of patient health questionnaires on My Chart. Patient Verbalizes understanding. Visit diagnosis can be found in [facility]10/29/2021. Completed 6MWT and gym orientation. Initial ITP created and sent for review to Dr. Mark Emily Filbertical Director. 30 Day review completed. Medical Director ITP review done, changes made as directed, and signed approval by Medical Director.   New to program First full day of exercise!  Patient was oriented to gym and equipment including functions, settings, policies, and procedures.  Patient's individual exercise prescription and treatment  plan were reviewed.  All starting workloads were established based on the results of the 6 minute walk test done at initial orientation visit.  The plan for exercise progression was also introduced and progression will be customized based on patient's performance and goals. 30 Day review completed. Medical Director ITP review done, changes made as directed, and signed approval by Medical Director.   [place] Name 05/26/20 1413 06/08/20 1553 06/09/20 0721       ITP Comments Called to check on Charlotte Cross.  She has been having a hard time with her feet being swollen and possibly around her lungs as well.  She has started some fluid medication and to help with swelling.  We talked about keeping feet elevated when not moving.  She has a follow up call tomorrow and possible appt on Friday.  We talked about returning to rehab next week to keep moving and fluid moving. Called to check on pt.  Out since 05/13/20.  Left message. 30 Day review  completed. Medical Director ITP review done, changes made as directed, and signed approval by Medical Director.            Comments:

## 2020-06-10 MED FILL — NUCALA 100 MG/ML SUBCUTANEOUS AUTO-INJECTOR: ORAL | 28 days supply | Qty: 1 | Fill #11

## 2020-06-16 ENCOUNTER — Ambulatory Visit: Admit: 2020-06-16 | Discharge: 2020-06-16 | Payer: MEDICARE

## 2020-06-16 ENCOUNTER — Encounter
Admit: 2020-06-16 | Discharge: 2020-06-16 | Payer: MEDICARE | Attending: Student in an Organized Health Care Education/Training Program | Primary: Student in an Organized Health Care Education/Training Program

## 2020-06-16 MED ORDER — ACETAMINOPHEN 300 MG-CODEINE 30 MG TABLET
ORAL_TABLET | Freq: Four times a day (QID) | ORAL | 0 refills | 5.00000 days | Status: CP
Start: 2020-06-16 — End: 2020-06-21
  Filled 2020-06-16: qty 20, 5d supply, fill #0

## 2020-06-16 MED ORDER — CLINDAMYCIN HCL 300 MG CAPSULE
ORAL_CAPSULE | Freq: Three times a day (TID) | ORAL | 0 refills | 7.00000 days | Status: CP
Start: 2020-06-16 — End: 2020-06-23
  Filled 2020-06-16: qty 21, 7d supply, fill #0

## 2020-06-16 MED ADMIN — bupivacaine (PF) (MARCAINE) 0.25 % (2.5 mg/mL) 30 mL, lidocaine (XYLOCAINE) 10 mg/mL (1 %) 30 mL: @ 19:00:00 | Stop: 2020-06-16

## 2020-06-16 MED ADMIN — lactated Ringers infusion: 50 mL/h | INTRAVENOUS | @ 18:00:00 | Stop: 2020-06-16

## 2020-06-16 MED ADMIN — sodium chloride irrigation (NS) 0.9 % irrigation solution: @ 19:00:00 | Stop: 2020-06-16

## 2020-06-16 MED ADMIN — propofoL (DIPRIVAN) injection: INTRAVENOUS | @ 19:00:00 | Stop: 2020-06-16

## 2020-06-16 MED ADMIN — clindamycin (CLEOCIN) 900 mg/50 mL IVPB 900 mg: 900 mg | INTRAVENOUS | @ 18:00:00 | Stop: 2020-06-16

## 2020-06-16 NOTE — Unmapped (Signed)
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    Performing Service: Vascular  Surgeon(s) and Role:     * Britt Bottom, DPM - Primary    Assistant: None    Findings: see op report    Anesthesia: General    Estimated Blood Loss: 29 mL    Complications: None    Specimens:   ID Type Source Tests Collected by Time Destination   1 : right foot third toe Tissue Foot, Right SURGICAL PATHOLOGY EXAM Britt Bottom, DPM 06/16/2020 1448    A : right foot third toe clean bone cultures Bone Foot, Right AEROBIC/ANAEROBIC CULTURE, FUNGAL CULTURE, AFB CULTURE Britt Bottom, DPM 06/16/2020 1447        Implants: * No implants in log *    Surgeon Notes: I performed the procedure    Britt Bottom   Date: 06/16/2020  Time: 4:04 PM

## 2020-06-16 NOTE — Unmapped (Signed)
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    Performing Service: Vascular  Surgeon(s) and Role:     * Britt Bottom, DPM - Primary    Assistant: None    Findings: see op report    Anesthesia: General    Estimated Blood Loss: 29 mL    Complications: None    Specimens:   ID Type Source Tests Collected by Time Destination   1 : right foot third toe Tissue Foot, Right SURGICAL PATHOLOGY EXAM Britt Bottom, DPM 06/16/2020 1448    A : right foot third toe clean bone cultures Bone Foot, Right AEROBIC/ANAEROBIC CULTURE, FUNGAL CULTURE, AFB CULTURE Britt Bottom, DPM 06/16/2020 1447        Implants: * No implants in log *    Surgeon Notes: I performed the procedure    Indication: Patient presented with right foot osteomyelitis of the third toe.  Benefits and risks as well as alternative options reviewed with the patient in detail.  Some of the risks discussed with the patient included pain, burning, numbness bleeding, infection, swelling, blood clots, delayed healing, non-healing, may require further surgeries, loss of function, loss of limb or loss of life. All questions were answered and no guarantees were given or implied. Patient understood and agreed. Written consent was signed, witnessed and placed in patient's chart.     Procedure description: Patient was seen and evaluated in the pre-operative area. Consent was reviewed. All questions were answered. Patient right leg was marked. Patient was then transferred from pre-operative area to OR and was placed on the OR table in supine position.  Pressure injury prevention as well as DVT prevention measures were taken.  Pre-anesthesia timeout was performed. Anesthesia was then inducted.  Appropriate prophylactic antibiotics were given. patient's right lower extremity was then scrubbed prepped and draped in the usual aseptic manner. Pre-incision time out was performed.     At this time, attention was then directed to right foot where approximately 20 cc of 0.25% Marcaine plain and 1% lidocaine plain was injected around the operative site with one-to-one mixture.  Once local anesthesia was induced, a modified racquet shaped incision was created around third metatarsophalangeal joint.  Third toe was resected at the joint level.  At this level we took soft tissue and bone cultures and sent to microbiology and the remaining toe was sent to pathology for evaluation of osteomyelitis.  At this level we did not see any abscess or purulence.  Third metatarsal head appeared to be healthy and we decided not to perform any amputation at the metatarsal head level.  Any bleeding vessels were cauterized or tied as needed for hemostasis.  The wound was then irrigated thoroughly using normal saline solution.  The skin was reapproximated in layers using 3-0 Monocryl and 3-0 Prolene and skin staples.  Patient had adequate perfusion at this level for skin healing.  We applied Adaptic soaked in Betadine, gauze, ABD pad, Kerlix and Ace to the right lower extremity.  Patient tolerated the procedure well and was transported from or to PACU with vital signs stable and vascular status intact as noted in the operating room.    Counts were correct at the end of the procedure.

## 2020-06-21 ENCOUNTER — Encounter: Payer: Medicare Other | Attending: Critical Care Medicine

## 2020-06-21 ENCOUNTER — Encounter: Payer: Self-pay | Admitting: *Deleted

## 2020-06-21 DIAGNOSIS — Z87891 Personal history of nicotine dependence: Secondary | ICD-10-CM | POA: Insufficient documentation

## 2020-06-21 DIAGNOSIS — D860 Sarcoidosis of lung: Secondary | ICD-10-CM | POA: Insufficient documentation

## 2020-06-21 DIAGNOSIS — D869 Sarcoidosis, unspecified: Secondary | ICD-10-CM

## 2020-06-21 DIAGNOSIS — J9611 Chronic respiratory failure with hypoxia: Secondary | ICD-10-CM | POA: Insufficient documentation

## 2020-06-23 NOTE — Unmapped (Unsigned)
 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  Status post right foot fifth toe amputation at the metatarsophalangeal joint level due to osteomyelitis on 12/12/19, healed  Sarcoidosis  History of aspergillus pneumonia with left upper lobectomy in 2016  5 L of oxygen per minute   Bilateral onychomycosis  Bilateral tinea pedis  T2DM and multiple co-morbidities      Plan:  Patient was seen and evaluated in detail. Patient's condition was discussed with the patient in detail.  Patient was given opportunity to ask questions.     Right 3rd toe amputation surgical site has healed well with sutures and staples intact, no drainage, no dehiscence, and no signs of infection. Skin is well co-apted. Sutures and staples removed; patient tolerated well.    I suspect no residual osteomyelitis given we removed entire 3rd toe and given clinical presentation of surgical site.     Right 4th toe ulcer had healed with no sings of infection.     Patient can wash foot with antibacterial soap and water, but she should refrain from soaking her foot.     Dressings: DSD applied. Continue daily DSD for another few days.    Activity: WBAT in [facility] shoe to right foot.     Antibiotics: Finish PO clindamycin as previously prescribed.     We discussed strict return precautions to the ED including redness, swelling, purulent drainage, fever, or chills.     RTC in 4-5 weeks.    All questions were answered and no guarantees were given or implied. Patient expressed understanding.     History of Present Illness: Patient is a 55 y.o.  female with a history of T2DM, HTN, COPD, hypoxia, sarcoidosis, severe persistent asthma dependent on systemic steroids who returns to the clinic s/p right foot amputation of 3rd toe at MPJ level (DOS 06/16/20). Her course of PO clindamycin 300 mg TID ended on 4/6.    Post-operatively, patient reports doing well. She denies witnessing any drainage or pus. Although patinet's abx course was supposed to end earlier, she reports having about 2-3 more days worth of medication left. Patient denies any other complaints. Patient denies fever, nausea, vomiting, chills, SOB, chest pain      A1c was 10.1% on 12/12/2019    Past Medical History:   Diagnosis Date   ??? 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 Breast injury     lung surg on left incision under breast sept 2016   ??? Caregiver burden     parents    ??? Hepatitis C antibody test positive 2016    repeatedly negative HCV RNA, indicating clearance of infection w/o treatment   ??? Hyperlipidemia    ??? Hypertension    ??? 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 On home O2 2008    per Dr Mal Amabile note from 02/21/2017   ??? Pulmonary aspergillosis (CMS-HCC) 2016    A.fumigatus in 2016, prior to LUL lobectomy. A.niger from sputum 08/2016-09/2016.   ??? 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 Sarcoidosis 1995   ??? Type 2 diabetes mellitus with hyperglycemia (CMS-HCC) 07/27/2014   ???  Visual impairment     glasses     No Known Allergies     ROS:   Constitutional:  Denies fever or chills   Eyes:  Denies change in visual acuity   HENT:  Denies nasal congestion or sore throat   Respiratory:  Denies cough or shortness of breath   Cardiovascular:  Denies chest pain or edema   GI:  Denies abdominal pain, nausea, vomiting, bloody stools or diarrhea   GU:  Denies dysuria   Musculoskeletal:  Denies back pain or joint pain   Integument:  Denies rash   Neurologic:  Denies headache, focal weakness or sensory changes   Endocrine:  Denies polyuria or polydipsia   Lymphatic:  Denies swollen glands   Psychiatric:  Denies depression or anxiety     Test Results  Imaging: Pending    Physical:   General Appearance: well nourished, Aox3    Vascular: Stable and unchanged.    Skin: Right 3rd toe amputation surgical site has healed well with sutures and staples intact, no drainage, no dehiscence, and no signs of infection. Skin is well co-apted. Sutures and staples removed.    Right 4th toe ulcer has healed with no signs of infection.     Dry, flaky skin noted to plantar aspect of bilateral feet.   Nails of are elongated, discolored, distrophic with sunungual debris x 10.    Neurological: Protective sensation is diminished bilaterally. Sharp/dull sensation is diminished bilaterally.     MSK: Stable and unchanged.             I personally spent 20 minutes face-to-face and non-face-to-face in the care of this patient, which includes all pre, intra, and post visit time on the date of service.      Scribe's Attestation: Otho Perl, DPM obtained and performed the history, physical exam and medical decision making elements that were  entered into the chart. Documentation assistance was provided by me personally, a scribe. Signed by Azalee Course, Scribe, on June 24, 2020 at 8:01 AM.     {*** NOTE TO PROVIDER: PLEASE ADD ATTESTATION NOTING YOU AGREE WITH SCRIBE DOCUMENTATION. ***}

## 2020-06-24 ENCOUNTER — Ambulatory Visit: Admit: 2020-06-24 | Discharge: 2020-06-25 | Payer: MEDICARE

## 2020-06-24 DIAGNOSIS — Z9889 Other specified postprocedural states: Principal | ICD-10-CM

## 2020-06-24 DIAGNOSIS — Z87828 Personal history of other (healed) physical injury and trauma: Principal | ICD-10-CM

## 2020-06-24 NOTE — Unmapped (Addendum)
WOUND CARE INSTRUCTIONS:    Wash area with Antibacterial liquid soap with one wash cloth, clean the soap off with a clean cloth, and then pat dry with a dry cloth before the dressing change.    Dressing: Clean with daken solution,  Pack 3rd & 4th toe with dakens soaked gauze then cover with dry gauze.      If you have any questions or concerns regarding your wound or wound care, please contact us at the [facility] clinic at [phone].          If your wound starts to develop the following , please call the [facility] for further advise:    ??  Increased drainage  ??  Redness around the wound  ??  Strong odor from the wound when changing the bandages  ??  Increased pain    Please do not hesitate to leave a voicemail on the nurse line. We make every effort to return your call the same day or the next day. Please leave a clear message with your name, date of birth,  and your medical record number. Leave a brief description of your problem.    If you are experiencing the following, please call us for advise or consider going to the nearest local Emergency Department or call 911.    ??  Fever of 100 F  ??  Nausea or Vomitting  ??  Pus draining from your wound  ??  Redness of the whole foot or leg  ??  Severe increase in pain above your baseline.    [facility]  [phone]

## 2020-06-25 NOTE — Unmapped (Signed)
[facility] Refill Coordination Note    Specialty Medication(s) to be Shipped:   CF/Pulmonary: -Nucala 100mg /ml  Other medication(s) to be shipped: No additional medications requested for fill at this time     Karen Barber, DOB: Aug 25, 1965  Phone: [phone] (home) [phone] (work)    All above HIPAA information was verified with patient.     Was a Nurse, learning disability used for this call? No    Completed refill call assessment today to schedule patient's medication shipment from the [facility] [phone]).       Specialty medication(s) and dose(s) confirmed: Regimen is correct and unchanged.   Changes to medications: Karen Barber reports no changes at this time.  Changes to insurance: No  Questions for the pharmacist: No    Confirmed patient received a Conservation officer, historic buildings and a Surveyor, mining with first shipment. The patient will receive a drug information handout for each medication shipped and additional FDA Medication Guides as required.       DISEASE/MEDICATION-SPECIFIC INFORMATION        For CF patients: CF [employer] Active? No-not enrolled    SPECIALTY MEDICATION ADHERENCE     Medication Adherence    Patient reported X missed doses in the last month: 0  Specialty Medication: Nucala 100mg /ml  Patient is on additional specialty medications: No  Patient is on more than two specialty medications: No  Informant: patient  Reliability of informant: reliable  Reasons for non-adherence: no problems identified        Nucala 100mg /ml: 0 days of medicine on hand (Next Injection: 07/15/20)    SHIPPING     Shipping address confirmed in Epic.     Delivery Scheduled: Yes, Expected medication delivery date: 07/12/2020.     Medication will be delivered via Same Day Courier to the prescription address in Epic WAM.    Karen Barber   [facility] Specialty Technician

## 2020-07-02 DIAGNOSIS — D869 Sarcoidosis, unspecified: Principal | ICD-10-CM

## 2020-07-02 DIAGNOSIS — Z794 Long term (current) use of insulin: Principal | ICD-10-CM

## 2020-07-02 DIAGNOSIS — B441 Other pulmonary aspergillosis: Principal | ICD-10-CM

## 2020-07-02 DIAGNOSIS — E1165 Type 2 diabetes mellitus with hyperglycemia: Principal | ICD-10-CM

## 2020-07-02 DIAGNOSIS — I272 Pulmonary hypertension, unspecified: Principal | ICD-10-CM

## 2020-07-02 MED ORDER — POSACONAZOLE 100 MG TABLET,DELAYED RELEASE
ORAL_TABLET | Freq: Every day | ORAL | 2 refills | 30 days | Status: CP
Start: 2020-07-02 — End: ?

## 2020-07-02 MED ORDER — INSULIN GLARGINE (U-100) 100 UNIT/ML (3 ML) SUBCUTANEOUS PEN
Freq: Every evening | SUBCUTANEOUS | 1 refills | 87 days | Status: CP
Start: 2020-07-02 — End: 2021-07-02

## 2020-07-02 MED ORDER — ALBUTEROL SULFATE HFA 90 MCG/ACTUATION AEROSOL INHALER
RESPIRATORY_TRACT | 1 refills | 0 days | Status: CP | PRN
Start: 2020-07-02 — End: 2021-07-02

## 2020-07-02 MED ORDER — BLOOD SUGAR DIAGNOSTIC STRIPS
ORAL_STRIP | Freq: Three times a day (TID) | 1 refills | 0 days | Status: CP
Start: 2020-07-02 — End: 2021-07-03

## 2020-07-02 MED ORDER — SILDENAFIL (PULMONARY HYPERTENSION) 20 MG TABLET
ORAL_TABLET | Freq: Three times a day (TID) | ORAL | 1 refills | 90 days | Status: CP
Start: 2020-07-02 — End: 2021-07-02

## 2020-07-02 NOTE — Unmapped (Signed)
Patient is requesting the following refill  Requested Prescriptions     Pending Prescriptions Disp Refills   ??? sildenafiL, pulm.hypertension, (REVATIO) 20 mg tablet 90 tablet 11     Sig: Take 1 tablet (20 mg total) by mouth Three (3) times a day.       Recent Visits  Date Type Provider Dept   01/19/20 Office Visit Keri Rosita Fire, FNP [facility]   12/19/19 Office Visit Hope Francesco Sor, MD [place]   11/11/19 Office Visit Keri Rosita Fire, FNP [facility]   Showing recent visits within past 365 days with a meds authorizing provider and meeting all other requirements  Future Appointments  No visits were found meeting these conditions.  Showing future appointments within next 365 days with a meds authorizing provider and meeting all other requirements

## 2020-07-02 NOTE — Unmapped (Signed)
Patient is requesting the following refill  Requested Prescriptions     Pending Prescriptions Disp Refills   ??? blood sugar diagnostic Strp 200 strip 2     Sig: by Other route Three (3) times a day. ACCU-CHEK Guide meter   ??? insulin glargine (BASAGLAR, LANTUS) 100 unit/mL (3 mL) injection pen 21.6 mL 1     Sig: Inject 0.24 mL (24 Units total) under the skin daily.   ??? albuterol HFA 90 mcg/actuation inhaler 24 g 1     Sig: Inhale 2 puffs every four (4) hours as needed for wheezing or shortness of breath.       Recent Visits  Date Type Provider Dept   01/19/20 Office Visit Keri Rosita Fire, FNP [facility]   12/19/19 Office Visit Hope Francesco Sor, MD [facility] [facility]   11/11/19 Office Visit Keri Rosita Fire, FNP [facility] [facility]   Showing recent visits within past 365 days with a meds authorizing provider and meeting all other requirements  Future Appointments  No visits were found meeting these conditions.  Showing future appointments within next 365 days with a meds authorizing provider and meeting all other requirements

## 2020-07-05 NOTE — Unmapped (Signed)
PA for Sildenafil submitted and approved  03/20/2020-07/02/2021

## 2020-07-06 DIAGNOSIS — D869 Sarcoidosis, unspecified: Secondary | ICD-10-CM

## 2020-07-06 NOTE — Unmapped (Signed)
Efaxed notes to [facility]. Both were from 2021. No recent ones available.

## 2020-07-07 ENCOUNTER — Encounter: Payer: Self-pay | Admitting: *Deleted

## 2020-07-07 DIAGNOSIS — D869 Sarcoidosis, unspecified: Secondary | ICD-10-CM

## 2020-07-07 NOTE — Progress Notes (Signed)
Pulmonary Individual Treatment Plan  Patient Details  Name: Charlotte Cross MRN: [id number] Date of Birth: Jan 30, 1966 Referring Provider:   Flowsheet Row Pulmonary Rehab from 04/13/2020 in [facility] Cardiac and Pulmonary Rehab  Referring Provider Vonita Moss MD      Initial Encounter Date:  Flowsheet Row Pulmonary Rehab from 04/13/2020 in [facility] Cardiac and Pulmonary Rehab  Date 04/13/20      Visit Diagnosis: Sarcoidosis  Patient's Home Medications on Admission:  Current Outpatient Medications:  .  Accu-Chek FastClix Lancets MISC, USE TO CHECK GLUCOSE 4 TIMES DAILY BEFORE MEAL(S) AND NIGHTLY, Disp: , Rfl:  .  acetaminophen (TYLENOL) 325 MG tablet, Take by mouth., Disp: , Rfl:  .  albuterol (PROVENTIL HFA;VENTOLIN HFA) 108 (90 BASE) MCG/ACT inhaler, Inhale 2 puffs into the lungs every 6 (six) hours as needed for wheezing or shortness of breath., Disp: , Rfl:  .  albuterol (PROVENTIL) (2.5 MG/3ML) 0.083% nebulizer solution, Inhale 2.5 mg into the lungs every 6 (six) hours as needed for wheezing or shortness of breath. , Disp: , Rfl:  .  albuterol (VENTOLIN HFA) 108 (90 Base) MCG/ACT inhaler, Inhale into the lungs. (Patient not taking: Reported on 03/30/2020), Disp: , Rfl:  .  amLODipine (NORVASC) 5 MG tablet, Take by mouth., Disp: , Rfl:  .  aspirin EC 325 MG tablet, Take 325 mg by mouth daily., Disp: , Rfl:  .  Blood Glucose Monitoring Suppl (ACCU-CHEK GUIDE) w/Device KIT, 1 Device by Other route Four (4) times a day (before meals and nightly). AS DIRECTED, Disp: , Rfl:  .  budesonide (PULMICORT) 0.5 MG/2ML nebulizer solution, Inhale 2 mLs (0.5 mg total) into the lungs every 12 (twelve) hours., Disp: 120 mL, Rfl: 0 .  budesonide-formoterol (SYMBICORT) 160-4.5 MCG/ACT inhaler, Inhale into the lungs., Disp: , Rfl:  .  cyclobenzaprine (FLEXERIL) 10 MG tablet, Take 10 mg by mouth 3 (three) times daily as needed for muscle spasms. , Disp: , Rfl:  .  feeding supplement, ENSURE ENLIVE, (ENSURE  ENLIVE) LIQD, Take 237 mLs by mouth daily. (Patient not taking: Reported on 03/30/2020), Disp: 237 mL, Rfl: 12 .  furosemide (LASIX) 20 MG tablet, Take by mouth., Disp: , Rfl:  .  gabapentin (NEURONTIN) 300 MG capsule, Take 300 mg by mouth 3 (three) times daily., Disp: , Rfl:  .  glucose blood (PRECISION QID TEST) test strip, by Other route Three (3) times a day. ACCU-CHEK Guide meter, Disp: , Rfl:  .  hydrochlorothiazide (HYDRODIURIL) 25 MG tablet, Take 25 mg by mouth daily., Disp: , Rfl:  .  insulin aspart (NOVOLOG) 100 UNIT/ML injection, INJECT 1 TO 20 UNIT(S) SUBCUTANEOUSLY 4 TIMES DAILY BEFORE MEALS AND AT BEDTIME AS DIRECTED, Disp: , Rfl:  .  Insulin Glargine (BASAGLAR KWIKPEN) 100 UNIT/ML, 18 Units nightly, Disp: , Rfl:  .  ipratropium (ATROVENT) 0.02 % nebulizer solution, 2 (two) times daily, Disp: , Rfl:  .  ketoconazole (NIZORAL) 2 % cream, Apply topically., Disp: , Rfl:  .  levofloxacin (LEVAQUIN) 750 MG tablet, Take 1 tablet (750 mg total) by mouth daily. (Patient not taking: Reported on 03/30/2020), Disp: 5 tablet, Rfl: 0 .  lisinopril (PRINIVIL,ZESTRIL) 10 MG tablet, Take 10 mg by mouth daily.  (Patient not taking: Reported on 03/30/2020), Disp: , Rfl:  .  Mepolizumab (NUCALA) 100 MG/ML SOAJ, Take by mouth., Disp: , Rfl:  .  metFORMIN (GLUCOPHAGE) 1000 MG tablet, Take 1,000 mg by mouth 2 (two) times daily with a meal., Disp: , Rfl:  .  posaconazole (  NOXAFIL) 100 MG TBEC delayed-release tablet, Take by mouth., Disp: , Rfl:  .  pravastatin (PRAVACHOL) 40 MG tablet, Take 40 mg by mouth at bedtime., Disp: , Rfl:  .  sildenafil (REVATIO) 20 MG tablet, Take by mouth., Disp: , Rfl:   Past Medical History: Past Medical History:  Diagnosis Date  . 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 COPD (chronic obstructive pulmonary disease) ([place])   . Diabetes mellitus without complication ([place])   . Hypertension   . Sarcoidosis     Tobacco Use: Social History   Tobacco Use  Smoking Status Former Smoker  .  Packs/day: 0.25  . Years: 6.00  . Pack years: 1.50  . Types: Cigarettes  . Quit date: 03/21/1987  . Years since quitting: 33.3  Smokeless Tobacco Never Used    Labs: Recent Chemical engineer    Labs for ITP Cardiac and Pulmonary Rehab Latest Ref Rng & Units 12/24/2014 08/26/2016   Hemoglobin A1c 4.8 - 5.6 % - 12.3(H)   HCO3 21.0 - 28.0 mEq/L 29.2(H) -       Pulmonary Assessment Scores:  Pulmonary Assessment Scores    Row Name 04/13/20 1556         ADL UCSD   ADL Phase Entry     SOB Score total 79     Rest 3     Walk 4     Stairs 5     Bath 3     Dress 3     Shop 4           CAT Score   CAT Score 29           mMRC Score   mMRC Score 4            UCSD: Self-administered rating of dyspnea associated with activities of daily living (ADLs) 6-point scale (0 = "not at all" to 5 = "maximal or unable to do because of breathlessness")  Scoring Scores range from 0 to 120.  Minimally important difference is 5 units  CAT: CAT can identify the health impairment of COPD patients and is better correlated with disease progression.  CAT has a scoring range of zero to 40. The CAT score is classified into four groups of low (less than 10), medium (10 - 20), high (21-30) and very high (31-40) based on the impact level of disease on health status. A CAT score over 10 suggests significant symptoms.  A worsening CAT score could be explained by an exacerbation, poor medication adherence, poor inhaler technique, or progression of COPD or comorbid conditions.  CAT MCID is 2 points  mMRC: mMRC (Modified Medical Research Council) Dyspnea Scale is used to assess the degree of baseline functional disability in patients of respiratory disease due to dyspnea. No minimal important difference is established. A decrease in score of 1 point or greater is considered a positive change.   Pulmonary Function Assessment:  Pulmonary Function Assessment - 04/13/20 1556      Breath   Shortness of  Breath Yes;Fear of Shortness of Breath;Limiting activity;Panic with Shortness of Breath           Exercise Target Goals: Exercise Program Goal: Individual exercise prescription set using results from initial 6 min walk test and THRR while considering  patient's activity barriers and safety.   Exercise Prescription Goal: Initial exercise prescription builds to 30-45 minutes a day of aerobic activity, 2-3 days per week.  Home exercise guidelines will be given to patient during program as part  of exercise prescription that the participant will acknowledge.  Education: Aerobic Exercise: - Group verbal and visual presentation on the components of exercise prescription. Introduces F.I.T.T principle from ACSM for exercise prescriptions.  Reviews F.I.T.T. principles of aerobic exercise including progression. Written material given at graduation.   Education: Resistance Exercise: - Group verbal and visual presentation on the components of exercise prescription. Introduces F.I.T.T principle from ACSM for exercise prescriptions  Reviews F.I.T.T. principles of resistance exercise including progression. Written material given at graduation. Flowsheet Row Pulmonary Rehab from 05/12/2020 in [facility] Cardiac and Pulmonary Rehab  Date 05/12/20  Educator AS  Instruction Review Code 1- Verbalizes Understanding       Education: Exercise & Equipment Safety: - Individual verbal instruction and demonstration of equipment use and safety with use of the equipment. Flowsheet Row Pulmonary Rehab from 05/12/2020 in [facility] Cardiac and Pulmonary Rehab  Date 03/30/20  Educator [facility]  Instruction Review Code 1- Verbalizes Understanding      Education: Exercise Physiology & General Exercise Guidelines: - Group verbal and written instruction with models to review the exercise physiology of the cardiovascular system and associated critical values. Provides general exercise guidelines with specific guidelines to those with  heart or lung disease.    Education: Flexibility, Balance, Mind/Body Relaxation: - Group verbal and visual presentation with interactive activity on the components of exercise prescription. Introduces F.I.T.T principle from ACSM for exercise prescriptions. Reviews F.I.T.T. principles of flexibility and balance exercise training including progression. Also discusses the mind body connection.  Reviews various relaxation techniques to help reduce and manage stress (i.e. Deep breathing, progressive muscle relaxation, and visualization). Balance handout provided to take home. Written material given at graduation.   Activity Barriers & Risk Stratification:  Activity Barriers & Cardiac Risk Stratification - 04/13/20 1550      Activity Barriers & Cardiac Risk Stratification   Activity Barriers Deconditioning;Muscular Weakness;Shortness of Breath;Assistive Device;Other (comment)    Comments uses scooter now due to SOB and fatigue           6 Minute Walk:  6 Minute Walk    Row Name 04/13/20 1545         6 Minute Walk   Phase Initial     Distance 270 feet     Walk Time 2 minutes     # of Rest Breaks 2  2:47, 1:23     MPH 1.53     METS 2.06     RPE 20     Perceived Dyspnea  4     VO2 Peak 7.2     Symptoms Yes (comment)     Comments SOB, fatigue, chest tightness from breathing     Resting HR 94 bpm  initially 77% on low tank     Resting BP 132/78     Resting Oxygen Saturation  99 %     Exercise Oxygen Saturation  during 6 min walk 84 %     Max Ex. HR 125 bpm     Max Ex. BP 152/84     2 Minute Post BP 132/76           Interval HR   1 Minute HR 112     2 Minute HR 123     3 Minute HR 118     4 Minute HR 121     5 Minute HR 122     6 Minute HR 125     2 Minute Post HR 114     Interval Heart Rate?  Yes           Interval Oxygen   Interval Oxygen? Yes     Baseline Oxygen Saturation % 99 %     1 Minute Oxygen Saturation % 98 %     1 Minute Liters of Oxygen 6 L  continuous  e-tank     2 Minute Oxygen Saturation % 86 %  seated     2 Minute Liters of Oxygen 6 L     3 Minute Oxygen Saturation % 88 %  seated     3 Minute Liters of Oxygen 6 L     4 Minute Oxygen Saturation % 93 %     4 Minute Liters of Oxygen 6 L     5 Minute Oxygen Saturation % 90 %  seated     5 Minute Liters of Oxygen 6 L     6 Minute Oxygen Saturation % 87 %  low at 84% at 2mn     6 Minute Liters of Oxygen 6 L     2 Minute Post Oxygen Saturation % 91 %     2 Minute Post Liters of Oxygen 6 L           Oxygen Initial Assessment:  Oxygen Initial Assessment - 04/13/20 1555      Home Oxygen   Home Oxygen Device Home Concentrator;Portable Concentrator;E-Tanks    Sleep Oxygen Prescription Continuous    Liters per minute 5    Home Exercise Oxygen Prescription Continuous    Liters per minute 5    Home Resting Oxygen Prescription Continuous    Liters per minute 5    Compliance with Home Oxygen Use Yes      Initial 6 min Walk   Oxygen Used Continuous;E-Tanks    Liters per minute 6      Program Oxygen Prescription   Program Oxygen Prescription Continuous;E-Tanks    Liters per minute 6      Intervention   Short Term Goals To learn and exhibit compliance with exercise, home and travel O2 prescription;To learn and understand importance of monitoring SPO2 with pulse oximeter and demonstrate accurate use of the pulse oximeter.;To learn and understand importance of maintaining oxygen saturations>88%;To learn and demonstrate proper pursed lip breathing techniques or other breathing techniques.;To learn and demonstrate proper use of respiratory medications    Long  Term Goals Exhibits compliance with exercise, home and travel O2 prescription;Verbalizes importance of monitoring SPO2 with pulse oximeter and return demonstration;Maintenance of O2 saturations>88%;Exhibits proper breathing techniques, such as pursed lip breathing or other method taught during program session;Compliance with respiratory  medication;Demonstrates proper use of MDI's           Oxygen Re-Evaluation:  Oxygen Re-Evaluation    Row Name 04/29/20 1414             Program Oxygen Prescription   Program Oxygen Prescription Continuous;E-Tanks       Liters per minute 6               Home Oxygen   Sleep Oxygen Prescription Continuous       Liters per minute 5       Home Exercise Oxygen Prescription Continuous       Liters per minute 5       Home Resting Oxygen Prescription Continuous       Liters per minute 5       Compliance with Home Oxygen Use Yes  Goals/Expected Outcomes   Short Term Goals To learn and exhibit compliance with exercise, home and travel O2 prescription;To learn and understand importance of monitoring SPO2 with pulse oximeter and demonstrate accurate use of the pulse oximeter.;To learn and understand importance of maintaining oxygen saturations>88%;To learn and demonstrate proper pursed lip breathing techniques or other breathing techniques.;To learn and demonstrate proper use of respiratory medications       Long  Term Goals Exhibits compliance with exercise, home and travel O2 prescription;Verbalizes importance of monitoring SPO2 with pulse oximeter and return demonstration;Maintenance of O2 saturations>88%;Exhibits proper breathing techniques, such as pursed lip breathing or other method taught during program session;Compliance with respiratory medication;Demonstrates proper use of MDI's       Comments Reviewed PLB technique with pt.  Talked about how it works and it's importance in maintaining their exercise saturations.       Goals/Expected Outcomes Short: Become more profiecient at using PLB.   Long: Become independent at using PLB.              Oxygen Discharge (Final Oxygen Re-Evaluation):  Oxygen Re-Evaluation - 04/29/20 1414      Program Oxygen Prescription   Program Oxygen Prescription Continuous;E-Tanks    Liters per minute 6      Home Oxygen   Sleep Oxygen  Prescription Continuous    Liters per minute 5    Home Exercise Oxygen Prescription Continuous    Liters per minute 5    Home Resting Oxygen Prescription Continuous    Liters per minute 5    Compliance with Home Oxygen Use Yes      Goals/Expected Outcomes   Short Term Goals To learn and exhibit compliance with exercise, home and travel O2 prescription;To learn and understand importance of monitoring SPO2 with pulse oximeter and demonstrate accurate use of the pulse oximeter.;To learn and understand importance of maintaining oxygen saturations>88%;To learn and demonstrate proper pursed lip breathing techniques or other breathing techniques.;To learn and demonstrate proper use of respiratory medications    Long  Term Goals Exhibits compliance with exercise, home and travel O2 prescription;Verbalizes importance of monitoring SPO2 with pulse oximeter and return demonstration;Maintenance of O2 saturations>88%;Exhibits proper breathing techniques, such as pursed lip breathing or other method taught during program session;Compliance with respiratory medication;Demonstrates proper use of MDI's    Comments Reviewed PLB technique with pt.  Talked about how it works and it's importance in maintaining their exercise saturations.    Goals/Expected Outcomes Short: Become more profiecient at using PLB.   Long: Become independent at using PLB.           Initial Exercise Prescription:  Initial Exercise Prescription - 04/13/20 1500      Date of Initial Exercise RX and Referring Provider   Date 04/13/20    Referring Provider Vonita Moss MD      Oxygen   Oxygen Continuous    Liters 6      Recumbant Bike   Level 1    RPM 50    Watts 10    Minutes 15    METs 2      NuStep   Level 1    SPM 80    Minutes 15    METs 2      Arm Ergometer   Level 1    RPM 30    Minutes 15    METs 2      Biostep-RELP   Level 1    SPM 50    Minutes 15  METs 2      Prescription Details   Frequency  (times per week) 3    Duration Progress to 30 minutes of continuous aerobic without signs/symptoms of physical distress      Intensity   THRR 40-80% of Max Heartrate 122-150    Ratings of Perceived Exertion 11-13    Perceived Dyspnea 0-4      Progression   Progression Continue to progress workloads to maintain intensity without signs/symptoms of physical distress.      Resistance Training   Training Prescription Yes    Weight 3 lb    Reps 10-15           Perform Capillary Blood Glucose checks as needed.  Exercise Prescription Changes:  Exercise Prescription Changes    Row Name 04/13/20 1500 05/12/20 1000           Response to Exercise   Blood Pressure (Admit) 132/78 132/64      Blood Pressure (Exercise) 152/84 124/64      Blood Pressure (Exit) 112/60 102/64      Heart Rate (Admit) 94 bpm 100 bpm      Heart Rate (Exercise) 1255 bpm 108 bpm      Heart Rate (Exit) 104 bpm 93 bpm      Oxygen Saturation (Admit) 99 % 90 %      Oxygen Saturation (Exercise) 84 % 89 %      Oxygen Saturation (Exit) 97 % 95 %      Rating of Perceived Exertion (Exercise) 20 17      Perceived Dyspnea (Exercise) 4 3      Symptoms SOB, fatigue SOB, fatigue      Comments walk test results --      Duration -- Progress to 30 minutes of  aerobic without signs/symptoms of physical distress      Intensity -- THRR unchanged             Progression   Progression -- Continue to progress workloads to maintain intensity without signs/symptoms of physical distress.      Average METs -- 2.35             Resistance Training   Training Prescription -- Yes      Weight -- 3 lb      Reps -- 10-15             Oxygen   Oxygen -- Continuous      Liters -- 6             NuStep   Level -- 1      Minutes -- 15      METs -- 2.7             Biostep-RELP   Level -- 1      Minutes -- 15      METs -- 2             Exercise Comments:   Exercise Goals and Review:  Exercise Goals    Row Name  04/13/20 1552             Exercise Goals   Increase Physical Activity Yes       Intervention Provide advice, education, support and counseling about physical activity/exercise needs.;Develop an individualized exercise prescription for aerobic and resistive training based on initial evaluation findings, risk stratification, comorbidities and participant's personal goals.       Expected Outcomes Short Term: Attend rehab on a regular basis to increase amount of physical  activity.;Long Term: Add in home exercise to make exercise part of routine and to increase amount of physical activity.;Long Term: Exercising regularly at least 3-5 days a week.       Increase Strength and Stamina Yes       Intervention Provide advice, education, support and counseling about physical activity/exercise needs.;Develop an individualized exercise prescription for aerobic and resistive training based on initial evaluation findings, risk stratification, comorbidities and participant's personal goals.       Expected Outcomes Short Term: Increase workloads from initial exercise prescription for resistance, speed, and METs.;Short Term: Perform resistance training exercises routinely during rehab and add in resistance training at home;Long Term: Improve cardiorespiratory fitness, muscular endurance and strength as measured by increased METs and functional capacity (6MWT)       Able to understand and use rate of perceived exertion (RPE) scale Yes       Intervention Provide education and explanation on how to use RPE scale       Expected Outcomes Short Term: Able to use RPE daily in rehab to express subjective intensity level;Long Term:  Able to use RPE to guide intensity level when exercising independently       Able to understand and use Dyspnea scale Yes       Intervention Provide education and explanation on how to use Dyspnea scale       Expected Outcomes Short Term: Able to use Dyspnea scale daily in rehab to express  subjective sense of shortness of breath during exertion;Long Term: Able to use Dyspnea scale to guide intensity level when exercising independently       Knowledge and understanding of Target Heart Rate Range (THRR) Yes       Intervention Provide education and explanation of THRR including how the numbers were predicted and where they are located for reference       Expected Outcomes Short Term: Able to state/look up THRR;Long Term: Able to use THRR to govern intensity when exercising independently;Short Term: Able to use daily as guideline for intensity in rehab       Able to check pulse independently Yes       Intervention Provide education and demonstration on how to check pulse in carotid and radial arteries.;Review the importance of being able to check your own pulse for safety during independent exercise       Expected Outcomes Short Term: Able to explain why pulse checking is important during independent exercise;Long Term: Able to check pulse independently and accurately       Understanding of Exercise Prescription Yes       Intervention Provide education, explanation, and written materials on patient's individual exercise prescription       Expected Outcomes Short Term: Able to explain program exercise prescription;Long Term: Able to explain home exercise prescription to exercise independently              Exercise Goals Re-Evaluation :  Exercise Goals Re-Evaluation    [place] Name 04/29/20 1413 05/12/20 1042 05/13/20 1445 05/26/20 1414 06/08/20 1554     Exercise Goal Re-Evaluation   Exercise Goals Review Increase Physical Activity;Able to understand and use rate of perceived exertion (RPE) scale;Knowledge and understanding of Target Heart Rate Range (THRR);Understanding of Exercise Prescription;Increase Strength and Stamina;Able to understand and use Dyspnea scale;Able to check pulse independently Increase Physical Activity;Increase Strength and Stamina;Understanding of Exercise  Prescription Increase Physical Activity;Increase Strength and Stamina -- --   Comments Reviewed RPE and dyspnea scales, THR and program prescription with  pt today.  Pt voiced understanding and was given a copy of goals to take home. Kennyth Lose has completed her first two full days of exercise.  She has had some other things come up, including her sugars, that have interferred with her attendance.  We will contiue to monitor her progress and encourage improved attendance. Pt able to follow exercise prescription today without complaint.  Will continue to monitor for progression.  Reviewed home exercise with pt today.  Pt plans to use bike/arm machine at home for exercise.  Reviewed THR, pulse, RPE, sign and symptoms, pulse oximetery and when to call 911 or MD.  Also discussed weather considerations and indoor options.  Pt voiced understanding. Called to check on Jackie.  She has been having a hard time with her feet being swollen and possibly around her lungs as well.  She has started some fluid medication and to help with swelling.  We talked about keeping feet elevated when not moving.  She has a follow up call tomorrow and possible appt on Friday.  We talked about returning to rehab next week to keep moving and fluid moving. Out since last review   Expected Outcomes Short: Use RPE daily to regulate intensity. Long: Follow program prescription in THR. Short: Improved reguarly attendance Long: Continue to follow program prescription. Short :  monitor HR and oxygen when exercising at home Long:  improve overall MET level -- --          Discharge Exercise Prescription (Final Exercise Prescription Changes):  Exercise Prescription Changes - 05/12/20 1000      Response to Exercise   Blood Pressure (Admit) 132/64    Blood Pressure (Exercise) 124/64    Blood Pressure (Exit) 102/64    Heart Rate (Admit) 100 bpm    Heart Rate (Exercise) 108 bpm    Heart Rate (Exit) 93 bpm    Oxygen Saturation (Admit) 90 %     Oxygen Saturation (Exercise) 89 %    Oxygen Saturation (Exit) 95 %    Rating of Perceived Exertion (Exercise) 17    Perceived Dyspnea (Exercise) 3    Symptoms SOB, fatigue    Duration Progress to 30 minutes of  aerobic without signs/symptoms of physical distress    Intensity THRR unchanged      Progression   Progression Continue to progress workloads to maintain intensity without signs/symptoms of physical distress.    Average METs 2.35      Resistance Training   Training Prescription Yes    Weight 3 lb    Reps 10-15      Oxygen   Oxygen Continuous    Liters 6      NuStep   Level 1    Minutes 15    METs 2.7      Biostep-RELP   Level 1    Minutes 15    METs 2           Nutrition:  Target Goals: Understanding of nutrition guidelines, daily intake of sodium <1571m, cholesterol <2029m calories 30% from fat and 7% or less from saturated fats, daily to have 5 or more servings of fruits and vegetables.  Education: All About Nutrition: -Group instruction provided by verbal, written material, interactive activities, discussions, models, and posters to present general guidelines for heart healthy nutrition including fat, fiber, MyPlate, the role of sodium in heart healthy nutrition, utilization of the nutrition label, and utilization of this knowledge for meal planning. Follow up email sent as well. Written material given  at graduation. Flowsheet Row Pulmonary Rehab from 05/12/2020 in [facility] Card[facility] and Pulmonary Rehab  Education need identified 04/13/20      Biometrics:  Pre Biometrics - 04/13/20 1553      Pre Biometrics   Height 5' 11.1" (1.806 m)    Weight 232 lb 4.8 oz (105.4 kg)    BMI (Calculated) 32.31    Single Leg Stand --   did not do due to SOB/fatigue           Nutrition Therapy Plan and Nutrition Goals:   Nutrition Assessments:  MEDIFICTS Score Key:  ?70 Need to make dietary changes   40-70 Heart Healthy Diet  ? 40 Therapeutic Level Cholesterol  Diet  Flowsheet Row Pulmonary Rehab from 04/13/2020 in [facility] Cardiac and Pulmonary Rehab  Picture Your Plate Total Score on Admission 54     Picture Your Plate Scores:  <01 Unhealthy dietary pattern with much room for improvement.  41-50 Dietary pattern unlikely to meet recommendations for good health and room for improvement.  51-60 More healthful dietary pattern, with some room for improvement.   >60 Healthy dietary pattern, although there may be some specific behaviors that could be improved.   Nutrition Goals Re-Evaluation:   Nutrition Goals Discharge (Final Nutrition Goals Re-Evaluation):   Psychosocial: Target Goals: Acknowledge presence or absence of significant depression and/or stress, maximize coping skills, provide positive support system. Participant is able to verbalize types and ability to use techniques and skills needed for reducing stress and depression.   Education: Stress, Anxiety, and Depression - Group verbal and visual presentation to define topics covered.  Reviews how body is impacted by stress, anxiety, and depression.  Also discusses healthy ways to reduce stress and to treat/manage anxiety and depression.  Written material given at graduation.   Education: Sleep Hygiene -Provides group verbal and written instruction about how sleep can affect your health.  Define sleep hygiene, discuss sleep cycles and impact of sleep habits. Review good sleep hygiene tips.    Initial Review & Psychosocial Screening:  Initial Psych Review & Screening - 03/30/20 0945      Initial Review   Current issues with Current Stress Concerns    Source of Stress Concerns Chronic Illness      Family Dynamics   Good Support System? Yes    Comments Kennyth Lose can look to her parents and brother for support. She states that she has no stress concerns other than she cannot get around and is limited to doing daily activities.      Barriers   Psychosocial barriers to participate in  program The patient should benefit from training in stress management and relaxation.      Screening Interventions   Interventions To provide support and resources with identified psychosocial needs;Provide feedback about the scores to participant;Encouraged to exercise    Expected Outcomes Short Term goal: Utilizing psychosocial counselor, staff and physician to assist with identification of specific Stressors or current issues interfering with healing process. Setting desired goal for each stressor or current issue identified.;Long Term Goal: Stressors or current issues are controlled or eliminated.;Short Term goal: Identification and review with participant of any Quality of Life or Depression concerns found by scoring the questionnaire.;Long Term goal: The participant improves quality of Life and PHQ9 Scores as seen by post scores and/or verbalization of changes           Quality of Life Scores:  Scores of 19 and below usually indicate a poorer quality of life in these  areas.  A difference of  2-3 points is a clinically meaningful difference.  A difference of 2-3 points in the total score of the Quality of Life Index has been associated with significant improvement in overall quality of life, self-image, physical symptoms, and general health in studies assessing change in quality of life.  PHQ-9: Recent Review Flowsheet Data    Depression screen [facility] 2/9 04/13/2020 03/06/2017   Decreased Interest 3 0   Down, Depressed, Hopeless 1 0   PHQ - 2 Score 4 0   Altered sleeping 3 3   Tired, decreased energy 3 3   Change in appetite 0 2   Feeling bad or failure about yourself  0 0   Trouble concentrating 0 0   Moving slowly or fidgety/restless 0 0   Suicidal thoughts 0 0   PHQ-9 Score 10 8   Difficult doing work/chores Very difficult Somewhat difficult     Interpretation of Total Score  Total Score Depression Severity:  1-4 = Minimal depression, 5-9 = Mild depression, 10-14 = Moderate  depression, 15-19 = Moderately severe depression, 20-27 = Severe depression   Psychosocial Evaluation and Intervention:  Psychosocial Evaluation - 03/30/20 0947      Psychosocial Evaluation & Interventions   Interventions Stress management education;Relaxation education;Encouraged to exercise with the program and follow exercise prescription    Comments Kennyth Lose can look to her parents and brother for support. She states that she has no stress concerns other than she cannot get around and is limited to doing daily activities.    Expected Outcomes Short: Exercise regularly to support mental health and notify staff of any changes. Long: maintain mental health and well being through teaching of rehab or prescribed medications independently.    Continue Psychosocial Services  Follow up required by staff           Psychosocial Re-Evaluation:   Psychosocial Discharge (Final Psychosocial Re-Evaluation):   Education: Education Goals: Education classes will be provided on a weekly basis, covering required topics. Participant will state understanding/return demonstration of topics presented.  Learning Barriers/Preferences:  Learning Barriers/Preferences - 03/30/20 0944      Learning Barriers/Preferences   Learning Barriers None    Learning Preferences None           General Pulmonary Education Topics:  Infection Prevention: - Provides verbal and written material to individual with discussion of infection control including proper hand washing and proper equipment cleaning during exercise session. Flowsheet Row Pulmonary Rehab from 05/12/2020 in [facility] Cardiac and Pulmonary Rehab  Date 03/30/20  Educator [facility]  Instruction Review Code 1- Verbalizes Understanding      Falls Prevention: - Provides verbal and written material to individual with discussion of falls prevention and safety. Flowsheet Row Pulmonary Rehab from 05/12/2020 in [facility] Cardiac and Pulmonary Rehab  Date 03/30/20   Educator [facility]  Instruction Review Code 1- Verbalizes Understanding      Chronic Lung Disease Review: - Group verbal instruction with posters, models, PowerPoint presentations and videos,  to review new updates, new respiratory medications, new advancements in procedures and treatments. Providing information on websites and "800" numbers for continued self-education. Includes information about supplement oxygen, available portable oxygen systems, continuous and intermittent flow rates, oxygen safety, concentrators, and Medicare reimbursement for oxygen. Explanation of Pulmonary Drugs, including class, frequency, complications, importance of spacers, rinsing mouth after steroid MDI's, and proper cleaning methods for nebulizers. Review of basic lung anatomy and physiology related to function, structure, and complications of lung disease. Review of  risk factors. Discussion about methods for diagnosing sleep apnea and types of masks and machines for OSA. Includes a review of the use of types of environmental controls: home humidity, furnaces, filters, dust mite/pet prevention, HEPA vacuums. Discussion about weather changes, air quality and the benefits of nasal washing. Instruction on Warning signs, infection symptoms, calling MD promptly, preventive modes, and value of vaccinations. Review of effective airway clearance, coughing and/or vibration techniques. Emphasizing that all should Create an Action Plan. Written material given at graduation. Flowsheet Row Pulmonary Rehab from 05/12/2020 in [facility] Cardiac and Pulmonary Rehab  Education need identified 04/13/20      AED/CPR: - Group verbal and written instruction with the use of models to demonstrate the basic use of the AED with the basic ABC's of resuscitation.    Anatomy and Cardiac Procedures: - Group verbal and visual presentation and models provide information about basic cardiac anatomy and function. Reviews the testing methods done to diagnose  heart disease and the outcomes of the test results. Describes the treatment choices: Medical Management, Angioplasty, or Coronary Bypass Surgery for treating various heart conditions including Myocardial Infarction, Angina, Valve Disease, and Cardiac Arrhythmias.  Written material given at graduation. Flowsheet Row Pulmonary Rehab from 05/12/2020 in [facility] Cardiac and Pulmonary Rehab  Date 05/12/20  Educator [facility]  Instruction Review Code 1- Verbalizes Understanding      Medication Safety: - Group verbal and visual instruction to review commonly prescribed medications for heart and lung disease. Reviews the medication, class of the drug, and side effects. Includes the steps to properly store meds and maintain the prescription regimen.  Written material given at graduation.   Other: -Provides group and verbal instruction on various topics (see comments)   Knowledge Questionnaire Score:  Knowledge Questionnaire Score - 04/13/20 1554      Knowledge Questionnaire Score   Pre Score 16/18 Education Focus: Nutriton and O2 safety            Core Components/Risk Factors/Patient Goals at Admission:  Personal Goals and Risk Factors at Admission - 04/13/20 1554      Core Components/Risk Factors/Patient Goals on Admission    Weight Management Yes;Weight Loss;Obesity    Intervention Weight Management: Develop a combined nutrition and exercise program designed to reach desired caloric intake, while maintaining appropriate intake of nutrient and fiber, sodium and fats, and appropriate energy expenditure required for the weight goal.;Weight Management: Provide education and appropriate resources to help participant work on and attain dietary goals.;Weight Management/Obesity: Establish reasonable short term and long term weight goals.;Obesity: Provide education and appropriate resources to help participant work on and attain dietary goals.    Admit Weight 232 lb 4.8 oz (105.4 kg)    Goal Weight: Short Term  225 lb (102.1 kg)    Goal Weight: Long Term 220 lb (99.8 kg)    Expected Outcomes Short Term: Continue to assess and modify interventions until short term weight is achieved;Long Term: Adherence to nutrition and physical activity/exercise program aimed toward attainment of established weight goal;Weight Loss: Understanding of general recommendations for a balanced deficit meal plan, which promotes 1-2 lb weight loss per week and includes a negative energy balance of [phone] kcal/d;Understanding recommendations for meals to include 15-35% energy as protein, 25-35% energy from fat, 35-60% energy from carbohydrates, less than 242m of dietary cholesterol, 20-35 gm of total fiber daily;Understanding of distribution of calorie intake throughout the day with the consumption of 4-5 meals/snacks    Improve shortness of breath with ADL's Yes  Intervention Provide education, individualized exercise plan and daily activity instruction to help decrease symptoms of SOB with activities of daily living.    Expected Outcomes Short Term: Improve cardiorespiratory fitness to achieve a reduction of symptoms when performing ADLs;Long Term: Be able to perform more ADLs without symptoms or delay the onset of symptoms    Diabetes Yes    Intervention Provide education about signs/symptoms and action to take for hypo/hyperglycemia.;Provide education about proper nutrition, including hydration, and aerobic/resistive exercise prescription along with prescribed medications to achieve blood glucose in normal ranges: Fasting glucose 65-99 mg/dL    Expected Outcomes Short Term: Participant verbalizes understanding of the signs/symptoms and immediate care of hyper/hypoglycemia, proper foot care and importance of medication, aerobic/resistive exercise and nutrition plan for blood glucose control.;Long Term: Attainment of HbA1C < 7%.    Hypertension Yes    Intervention Provide education on lifestyle modifcations including regular  physical activity/exercise, weight management, moderate sodium restriction and increased consumption of fresh fruit, vegetables, and low fat dairy, alcohol moderation, and smoking cessation.;Monitor prescription use compliance.    Expected Outcomes Long Term: Maintenance of blood pressure at goal levels.;Short Term: Continued assessment and intervention until BP is < 140/24m HG in hypertensive participants. < 130/849mHG in hypertensive participants with diabetes, heart failure or chronic kidney disease.    Lipids Yes    Intervention Provide education and support for participant on nutrition & aerobic/resistive exercise along with prescribed medications to achieve LDL <7062mHDL >67m57m  Expected Outcomes Short Term: Participant states understanding of desired cholesterol values and is compliant with medications prescribed. Participant is following exercise prescription and nutrition guidelines.;Long Term: Cholesterol controlled with medications as prescribed, with individualized exercise RX and with personalized nutrition plan. Value goals: LDL < 70mg65mL > 40 mg.           Education:Diabetes - Individual verbal and written instruction to review signs/symptoms of diabetes, desired ranges of glucose level fasting, after meals and with exercise. Acknowledge that pre and post exercise glucose checks will be done for 3 sessions at entry of program. Flowsheet Row Pulmonary Rehab from 05/12/2020 in [facility] and Pulmonary Rehab  Date 04/13/20  Educator [facility] Inctruction Review Code 1- Verbalizes Understanding      Know Your Numbers and Heart Failure: - Group verbal and visual instruction to discuss disease risk factors for cardiac and pulmonary disease and treatment options.  Reviews associated critical values for Overweight/Obesity, Hypertension, Cholesterol, and Diabetes.  Discusses basics of heart failure: signs/symptoms and treatments.  Introduces Heart Failure Zone chart for action plan for  heart failure.  Written material given at graduation.   Core Components/Risk Factors/Patient Goals Review:    Core Components/Risk Factors/Patient Goals at Discharge (Final Review):    ITP Comments:  ITP Comments    Row Name 03/30/20 0941 04/13/20 1544 04/14/20 0937 04/29/20 1411 05/12/20 0615   ITP Comments Virtual Visit completed. Patient informed on EP and RD appointment and 6 Minute walk test. Patient also informed of patient health questionnaires on My Chart. Patient Verbalizes understanding. Visit diagnosis can be found in [facility]10/29/2021. Completed 6MWT and gym orientation. Initial ITP created and sent for review to Dr. Mark Emily Filbertical Director. 30 Day review completed. Medical Director ITP review done, changes made as directed, and signed approval by Medical Director.   New to program First full day of exercise!  Patient was oriented to gym and equipment including functions, settings, policies, and procedures.  Patient's individual exercise prescription and treatment  plan were reviewed.  All starting workloads were established based on the results of the 6 minute walk test done at initial orientation visit.  The plan for exercise progression was also introduced and progression will be customized based on patient's performance and goals. 30 Day review completed. Medical Director ITP review done, changes made as directed, and signed approval by Medical Director.   [place] Name 05/26/20 1413 06/08/20 1553 06/09/20 0721 06/21/20 1600 07/06/20 0800   ITP Comments Called to check on Mitchell.  She has been having a hard time with her feet being swollen and possibly around her lungs as well.  She has started some fluid medication and to help with swelling.  We talked about keeping feet elevated when not moving.  She has a follow up call tomorrow and possible appt on Friday.  We talked about returning to rehab next week to keep moving and fluid moving. Called to check on pt.  Out since 05/13/20.  Left  message. 30 Day review completed. Medical Director ITP review done, changes made as directed, and signed approval by Medical Director. 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  Her f/u is scheduled for 06/24/20, will need clearance to return. Patient has not been to rehba since last review. - Toe amputated 3/30, f/u was sch for 4/7. Have not heard back regarding follow up or clearance.   [place] Name 07/07/20 0937           ITP Comments 30 Day review completed. Medical Director ITP review done, changes made as directed, and signed approval by Medical Director.              Comments:

## 2020-07-09 DIAGNOSIS — Z7952 Long term (current) use of systemic steroids: Principal | ICD-10-CM

## 2020-07-09 DIAGNOSIS — J4550 Severe persistent asthma, uncomplicated: Principal | ICD-10-CM

## 2020-07-09 DIAGNOSIS — D721 Eosinophilia: Principal | ICD-10-CM

## 2020-07-09 MED ORDER — NUCALA 100 MG/ML SUBCUTANEOUS AUTO-INJECTOR
ORAL | 12 refills | 28.00000 days
Start: 2020-07-09 — End: ?

## 2020-07-12 NOTE — Unmapped (Signed)
Karen Barber 's Nucala shipment will be delayed as a result of no refills remain on the prescription.  Prescription expired.    I have reached out to the patient  at [phone] and communicated the delay. We will call the patient back to reschedule the delivery upon resolution. We have not confirmed the new delivery date.

## 2020-07-13 ENCOUNTER — Telehealth: Payer: Self-pay

## 2020-07-13 NOTE — Telephone Encounter (Signed)
Attempted to call patient to follow up on Pulmonary Rehab. No answer and could not leave voicemail as mailbox was full.

## 2020-07-14 MED ORDER — NUCALA 100 MG/ML SUBCUTANEOUS AUTO-INJECTOR
ORAL | 12 refills | 28 days
Start: 2020-07-14 — End: ?

## 2020-07-15 ENCOUNTER — Telehealth: Payer: Self-pay

## 2020-07-15 MED ORDER — NUCALA 100 MG/ML SUBCUTANEOUS AUTO-INJECTOR
SUBCUTANEOUS | 12 refills | 28 days
Start: 2020-07-15 — End: ?

## 2020-07-15 MED ORDER — MEPOLIZUMAB 100 MG/ML SUBCUTANEOUS AUTO-INJECTOR
ORAL | 12 refills | 28.00000 days
Start: 2020-07-15 — End: ?

## 2020-07-15 MED FILL — NUCALA 100 MG/ML SUBCUTANEOUS AUTO-INJECTOR: SUBCUTANEOUS | 28 days supply | Qty: 1 | Fill #0

## 2020-07-15 NOTE — Unmapped (Signed)
Karen Barber 's Nucala shipment will be sent out  as a result of a new prescription for the medication has been received.      I have reached out to the patient  at [phone] and communicated the delivery change. We will reschedule the medication for the delivery date that the patient agreed upon.  We have confirmed the delivery date as 4/28, via same day courier.

## 2020-07-15 NOTE — Telephone Encounter (Signed)
Error- please disregard

## 2020-07-15 NOTE — Telephone Encounter (Signed)
Charlotte Cross plans to return next week - her Pulmonologist wants her to return - this EP will fax clearance to podiatrist

## 2020-07-20 ENCOUNTER — Ambulatory Visit: Admit: 2020-07-20 | Discharge: 2020-07-21 | Payer: MEDICARE

## 2020-07-20 DIAGNOSIS — Z87828 Personal history of other (healed) physical injury and trauma: Principal | ICD-10-CM

## 2020-07-20 DIAGNOSIS — Z9889 Other specified postprocedural states: Principal | ICD-10-CM

## 2020-07-20 DIAGNOSIS — D869 Sarcoidosis, unspecified: Secondary | ICD-10-CM

## 2020-07-20 NOTE — Unmapped (Signed)
Call and make an appointment    [facility]  [address], [address], [place]  [phone]    [facility]  Address: [address] [place], [place] [address]  Phone: [phone]    [facility]  Address: [address] [place], [place] [address]  Phone: [phone]    [facility]  [address]  [place], [place] [address]  Phone: [phone]      [facility]  [address]  [place], [place] [address]  Phone: [phone]      [facility]  [address] [place], [place] [address]  Phone: [phone]

## 2020-07-20 NOTE — Unmapped (Signed)
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  Status post right foot fifth toe amputation at the metatarsophalangeal joint level due to osteomyelitis on 12/12/19, healed  Sarcoidosis  History of aspergillus pneumonia with left upper lobectomy in 2016  5 L of oxygen per minute   Bilateral onychomycosis  Bilateral tinea pedis  T2DM and multiple co-morbidities      Plan:  Patient was seen and evaluated in detail. Patient's condition was discussed with the patient in detail.  Patient was given opportunity to ask questions.     Right 3rd toe amputation surgical site has healed well. Skin is well coapted with no SOI. Right 4th toe ulcer also remains healed.    Patient can resume pulmonary rehab.     Diabetic shoes and custom inserts prescribed.    Activity: WBAT diabetic shoes and custom inserts once obtained. Until obtaining, patient should wear post-op shoe. For pulmonary rehab, patient can wear a soft-top shoe.    Patient to moisturize.    Recommend mirror therapy for phantom pain.    RTC in 6 months.    All questions were answered and no guarantees were given or implied. Patient expressed understanding.     History of Present Illness: Patient is a 55 y.o.  female with a history of T2DM, HTN, COPD, hypoxia, sarcoidosis, severe persistent asthma dependent on systemic steroids who returns to the clinic s/p right foot amputation of 3rd toe at MPJ level (DOS 06/16/20). Patient reports doing well today. She sometimes experiences phantom pain. She is ready to return to pulmonary rehab. She notes not obtaining her custom inserts yet which she was prescribed over a year ago. Patient denies any other complaints. Patient denies fever, nausea, vomiting, chills, SOB, chest pain      A1c was 10.1% on 12/12/2019    Past Medical History:   Diagnosis Date   ??? 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 Breast injury     lung surg on left incision under breast sept 2016   ??? Caregiver burden     parents    ??? Hepatitis C antibody test positive 2016    repeatedly negative HCV RNA, indicating clearance of infection w/o treatment   ??? Hyperlipidemia    ??? Hypertension    ??? 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 On home O2 2008    per Dr Mal Amabile note from 02/21/2017   ??? Pulmonary aspergillosis (CMS-HCC) 2016    A.fumigatus in 2016, prior to LUL lobectomy. A.niger from sputum 08/2016-09/2016.   ??? 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 Sarcoidosis 1995   ??? Type 2 diabetes mellitus with hyperglycemia (CMS-HCC) 07/27/2014   ??? Visual impairment     glasses     No Known Allergies     ROS:   Constitutional:  Denies fever or chills   Eyes:  Denies change in visual acuity   HENT:  Denies nasal congestion or sore throat   Respiratory:  Denies cough or shortness of breath   Cardiovascular:  Denies chest pain or edema   GI:  Denies abdominal pain, nausea, vomiting, bloody stools  or diarrhea   GU:  Denies dysuria   Musculoskeletal:  Denies back pain or joint pain   Integument:  Denies rash   Neurologic:  Denies headache, focal weakness or sensory changes   Endocrine:  Denies polyuria or polydipsia   Lymphatic:  Denies swollen glands   Psychiatric:  Denies depression or anxiety     Test Results  Imaging: Pending    Physical:   General Appearance: well nourished, Aox3    Vascular: Stable and unchanged.    Skin: Right 3rd toe amputation surgical site has healed well. Skin is well coapted with no SOI.  Right 4th toe ulcer has healed with no signs of infection.     Dry, flaky skin noted to plantar aspect of bilateral feet.   Nails of are elongated, discolored, distrophic with sunungual debris x 10.    Neurological: Protective sensation is diminished bilaterally. Sharp/dull sensation is diminished bilaterally.     MSK: Stable and unchanged.     I personally spent 20 minutes face-to-face and non-face-to-face in the care of this patient, which includes all pre, intra, and post visit time on the date of service.    ______________________________________________________________________    Scribe's Attestation: Otho Perl, DPM obtained and performed the history, physical exam and medical decision making elements that were  entered into the chart. Documentation assistance was provided by me personally, a scribe. Signed by Azalee Course, Scribe, on Jul 20, 2020 at 8:07 AM.     Documentation assistance provided by the Scribe. I was present during the time the encounter was recorded. The information recorded by the Scribe was done at my direction and has been reviewed and validated by me.

## 2020-07-20 NOTE — Progress Notes (Signed)
Charlotte Cross called to let us know that she will be back Thursday 07/22/20. She has clearance to come back.

## 2020-07-26 ENCOUNTER — Telehealth: Payer: Self-pay

## 2020-07-26 ENCOUNTER — Encounter: Payer: Medicare Other | Attending: Critical Care Medicine

## 2020-07-26 DIAGNOSIS — Z794 Long term (current) use of insulin: Secondary | ICD-10-CM | POA: Insufficient documentation

## 2020-07-26 DIAGNOSIS — Z7984 Long term (current) use of oral hypoglycemic drugs: Secondary | ICD-10-CM | POA: Insufficient documentation

## 2020-07-26 DIAGNOSIS — D869 Sarcoidosis, unspecified: Secondary | ICD-10-CM | POA: Insufficient documentation

## 2020-07-26 DIAGNOSIS — J9610 Chronic respiratory failure, unspecified whether with hypoxia or hypercapnia: Secondary | ICD-10-CM | POA: Insufficient documentation

## 2020-07-26 DIAGNOSIS — Z87891 Personal history of nicotine dependence: Secondary | ICD-10-CM | POA: Insufficient documentation

## 2020-07-26 NOTE — Telephone Encounter (Signed)
Called Kennyth Lose as she was cleared to return and start back 5/5. Have not seen or heard from her for 5/5 or 5/9 rehab sessions. LM.

## 2020-07-28 ENCOUNTER — Other Ambulatory Visit: Payer: Self-pay

## 2020-07-28 DIAGNOSIS — Z7984 Long term (current) use of oral hypoglycemic drugs: Secondary | ICD-10-CM | POA: Diagnosis not present

## 2020-07-28 DIAGNOSIS — D869 Sarcoidosis, unspecified: Secondary | ICD-10-CM | POA: Diagnosis present

## 2020-07-28 DIAGNOSIS — Z794 Long term (current) use of insulin: Secondary | ICD-10-CM | POA: Diagnosis not present

## 2020-07-28 DIAGNOSIS — J9610 Chronic respiratory failure, unspecified whether with hypoxia or hypercapnia: Secondary | ICD-10-CM | POA: Diagnosis not present

## 2020-07-28 DIAGNOSIS — Z87891 Personal history of nicotine dependence: Secondary | ICD-10-CM | POA: Diagnosis not present

## 2020-07-28 LAB — GLUCOSE, CAPILLARY
Glucose-Capillary: 169 mg/dL — ABNORMAL HIGH (ref 70–99)
Glucose-Capillary: 162 mg/dL — ABNORMAL HIGH (ref 70–99)

## 2020-07-28 NOTE — Progress Notes (Signed)
Daily Session Note  Patient Details  Name: Charlotte Cross MRN: [id number] Date of Birth: 1966/01/09 Referring Provider:   Flowsheet Row Pulmonary Rehab from 04/13/2020 in [facility]  Referring Provider Vonita Moss MD      Encounter Date: 07/28/2020  Check In:  Session Check In - 07/28/20 1413      Check-In   Supervising physician immediately available to respond to emergencies See telemetry face sheet for immediately available ER MD    Location [facility]-Cardiac & Pulmonary Rehab    Staff Present Birdie Sons, MPA, RN;Joseph Lou Miner, MS Exercise Physiologist    Virtual Visit No    Medication changes reported     No    Fall or balance concerns reported    No    Warm-up and Cool-down Performed on first and last piece of equipment    Resistance Training Performed Yes    VAD Patient? No    PAD/SET Patient? No      Pain Assessment   Currently in Pain? No/denies              Social History   Tobacco Use  Smoking Status Former Smoker  . Packs/day: 0.25  . Years: 6.00  . Pack years: 1.50  . Types: Cigarettes  . Quit date: 03/21/1987  . Years since quitting: 33.3  Smokeless Tobacco Never Used    Goals Met:  Independence with exercise equipment Exercise tolerated well No report of cardiac concerns or symptoms Strength training completed today  Goals Unmet:  Not Applicable  Comments: Pt able to follow exercise prescription today without complaint.  Will continue to monitor for progression.    Dr. Emily Filbert is Medical Director for [facility].

## 2020-08-03 DIAGNOSIS — D869 Sarcoidosis, unspecified: Secondary | ICD-10-CM | POA: Diagnosis not present

## 2020-08-03 LAB — GLUCOSE, CAPILLARY: Glucose-Capillary: 94 mg/dL (ref 70–99)

## 2020-08-04 ENCOUNTER — Encounter: Payer: Self-pay | Admitting: *Deleted

## 2020-08-04 ENCOUNTER — Other Ambulatory Visit: Payer: Self-pay

## 2020-08-04 DIAGNOSIS — D869 Sarcoidosis, unspecified: Secondary | ICD-10-CM | POA: Diagnosis not present

## 2020-08-04 LAB — GLUCOSE, CAPILLARY: Glucose-Capillary: 199 mg/dL — ABNORMAL HIGH (ref 70–99)

## 2020-08-04 NOTE — Progress Notes (Signed)
Daily Session Note  Patient Details  Name: Charlotte Cross MRN: [id number] Date of Birth: [birth year] Referring Provider:   Flowsheet Row Pulmonary Rehab from 04/13/2020 in [facility]  Referring Provider Vonita Moss MD      Encounter Date: 08/04/2020  Check In:  Session Check In - 08/04/20 1543      Check-In   Supervising physician immediately available to respond to emergencies See telemetry face sheet for immediately available ER MD    Location [facility]-Cardiac & Pulmonary Rehab    Staff Present Birdie Sons, MPA, RN;Joseph Lou Miner, MS Exercise Physiologist    Virtual Visit No    Medication changes reported     No    Fall or balance concerns reported    No    Warm-up and Cool-down Performed on first and last piece of equipment    Resistance Training Performed Yes    VAD Patient? No    PAD/SET Patient? No      Pain Assessment   Currently in Pain? No/denies              Social History   Tobacco Use  Smoking Status Former Smoker  . Packs/day: 0.25  . Years: 6.00  . Pack years: 1.50  . Types: Cigarettes  . Quit date: 03/21/1987  . Years since quitting: 33.3  Smokeless Tobacco Never Used    Goals Met:  Independence with exercise equipment Exercise tolerated well No report of cardiac concerns or symptoms Strength training completed today  Goals Unmet:  Not Applicable  Comments: Pt able to follow exercise prescription today without complaint.  Will continue to monitor for progression.    Dr. Emily Filbert is Medical Director for [facility].

## 2020-08-04 NOTE — Progress Notes (Signed)
Pulmonary Individual Treatment Plan  Patient Details  Name: Charlotte Cross MRN: [id number] Date of Birth: Jan 30, 1966 Referring Provider:   Flowsheet Row Pulmonary Rehab from 04/13/2020 in [facility] Cardiac and Pulmonary Rehab  Referring Provider Vonita Moss MD      Initial Encounter Date:  Flowsheet Row Pulmonary Rehab from 04/13/2020 in [facility] Cardiac and Pulmonary Rehab  Date 04/13/20      Visit Diagnosis: Sarcoidosis  Patient's Home Medications on Admission:  Current Outpatient Medications:  .  Accu-Chek FastClix Lancets MISC, USE TO CHECK GLUCOSE 4 TIMES DAILY BEFORE MEAL(S) AND NIGHTLY, Disp: , Rfl:  .  acetaminophen (TYLENOL) 325 MG tablet, Take by mouth., Disp: , Rfl:  .  albuterol (PROVENTIL HFA;VENTOLIN HFA) 108 (90 BASE) MCG/ACT inhaler, Inhale 2 puffs into the lungs every 6 (six) hours as needed for wheezing or shortness of breath., Disp: , Rfl:  .  albuterol (PROVENTIL) (2.5 MG/3ML) 0.083% nebulizer solution, Inhale 2.5 mg into the lungs every 6 (six) hours as needed for wheezing or shortness of breath. , Disp: , Rfl:  .  albuterol (VENTOLIN HFA) 108 (90 Base) MCG/ACT inhaler, Inhale into the lungs. (Patient not taking: Reported on 03/30/2020), Disp: , Rfl:  .  amLODipine (NORVASC) 5 MG tablet, Take by mouth., Disp: , Rfl:  .  aspirin EC 325 MG tablet, Take 325 mg by mouth daily., Disp: , Rfl:  .  Blood Glucose Monitoring Suppl (ACCU-CHEK GUIDE) w/Device KIT, 1 Device by Other route Four (4) times a day (before meals and nightly). AS DIRECTED, Disp: , Rfl:  .  budesonide (PULMICORT) 0.5 MG/2ML nebulizer solution, Inhale 2 mLs (0.5 mg total) into the lungs every 12 (twelve) hours., Disp: 120 mL, Rfl: 0 .  budesonide-formoterol (SYMBICORT) 160-4.5 MCG/ACT inhaler, Inhale into the lungs., Disp: , Rfl:  .  cyclobenzaprine (FLEXERIL) 10 MG tablet, Take 10 mg by mouth 3 (three) times daily as needed for muscle spasms. , Disp: , Rfl:  .  feeding supplement, ENSURE ENLIVE, (ENSURE  ENLIVE) LIQD, Take 237 mLs by mouth daily. (Patient not taking: Reported on 03/30/2020), Disp: 237 mL, Rfl: 12 .  furosemide (LASIX) 20 MG tablet, Take by mouth., Disp: , Rfl:  .  gabapentin (NEURONTIN) 300 MG capsule, Take 300 mg by mouth 3 (three) times daily., Disp: , Rfl:  .  glucose blood (PRECISION QID TEST) test strip, by Other route Three (3) times a day. ACCU-CHEK Guide meter, Disp: , Rfl:  .  hydrochlorothiazide (HYDRODIURIL) 25 MG tablet, Take 25 mg by mouth daily., Disp: , Rfl:  .  insulin aspart (NOVOLOG) 100 UNIT/ML injection, INJECT 1 TO 20 UNIT(S) SUBCUTANEOUSLY 4 TIMES DAILY BEFORE MEALS AND AT BEDTIME AS DIRECTED, Disp: , Rfl:  .  Insulin Glargine (BASAGLAR KWIKPEN) 100 UNIT/ML, 18 Units nightly, Disp: , Rfl:  .  ipratropium (ATROVENT) 0.02 % nebulizer solution, 2 (two) times daily, Disp: , Rfl:  .  ketoconazole (NIZORAL) 2 % cream, Apply topically., Disp: , Rfl:  .  levofloxacin (LEVAQUIN) 750 MG tablet, Take 1 tablet (750 mg total) by mouth daily. (Patient not taking: Reported on 03/30/2020), Disp: 5 tablet, Rfl: 0 .  lisinopril (PRINIVIL,ZESTRIL) 10 MG tablet, Take 10 mg by mouth daily.  (Patient not taking: Reported on 03/30/2020), Disp: , Rfl:  .  Mepolizumab (NUCALA) 100 MG/ML SOAJ, Take by mouth., Disp: , Rfl:  .  metFORMIN (GLUCOPHAGE) 1000 MG tablet, Take 1,000 mg by mouth 2 (two) times daily with a meal., Disp: , Rfl:  .  posaconazole (  NOXAFIL) 100 MG TBEC delayed-release tablet, Take by mouth., Disp: , Rfl:  .  pravastatin (PRAVACHOL) 40 MG tablet, Take 40 mg by mouth at bedtime., Disp: , Rfl:  .  sildenafil (REVATIO) 20 MG tablet, Take by mouth., Disp: , Rfl:   Past Medical History: Past Medical History:  Diagnosis Date  . 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 COPD (chronic obstructive pulmonary disease) ([place])   . Diabetes mellitus without complication ([place])   . Hypertension   . Sarcoidosis     Tobacco Use: Social History   Tobacco Use  Smoking Status Former Smoker  .  Packs/day: 0.25  . Years: 6.00  . Pack years: 1.50  . Types: Cigarettes  . Quit date: 03/21/1987  . Years since quitting: 33.3  Smokeless Tobacco Never Used    Labs: Recent Chemical engineer    Labs for ITP Cardiac and Pulmonary Rehab Latest Ref Rng & Units 12/24/2014 08/26/2016   Hemoglobin A1c 4.8 - 5.6 % - 12.3(H)   HCO3 21.0 - 28.0 mEq/L 29.2(H) -       Pulmonary Assessment Scores:  Pulmonary Assessment Scores    Row Name 04/13/20 1556         ADL UCSD   ADL Phase Entry     SOB Score total 79     Rest 3     Walk 4     Stairs 5     Bath 3     Dress 3     Shop 4           CAT Score   CAT Score 29           mMRC Score   mMRC Score 4            UCSD: Self-administered rating of dyspnea associated with activities of daily living (ADLs) 6-point scale (0 = "not at all" to 5 = "maximal or unable to do because of breathlessness")  Scoring Scores range from 0 to 120.  Minimally important difference is 5 units  CAT: CAT can identify the health impairment of COPD patients and is better correlated with disease progression.  CAT has a scoring range of zero to 40. The CAT score is classified into four groups of low (less than 10), medium (10 - 20), high (21-30) and very high (31-40) based on the impact level of disease on health status. A CAT score over 10 suggests significant symptoms.  A worsening CAT score could be explained by an exacerbation, poor medication adherence, poor inhaler technique, or progression of COPD or comorbid conditions.  CAT MCID is 2 points  mMRC: mMRC (Modified Medical Research Council) Dyspnea Scale is used to assess the degree of baseline functional disability in patients of respiratory disease due to dyspnea. No minimal important difference is established. A decrease in score of 1 point or greater is considered a positive change.   Pulmonary Function Assessment:  Pulmonary Function Assessment - 04/13/20 1556      Breath   Shortness of  Breath Yes;Fear of Shortness of Breath;Limiting activity;Panic with Shortness of Breath           Exercise Target Goals: Exercise Program Goal: Individual exercise prescription set using results from initial 6 min walk test and THRR while considering  patient's activity barriers and safety.   Exercise Prescription Goal: Initial exercise prescription builds to 30-45 minutes a day of aerobic activity, 2-3 days per week.  Home exercise guidelines will be given to patient during program as part  of exercise prescription that the participant will acknowledge.  Education: Aerobic Exercise: - Group verbal and visual presentation on the components of exercise prescription. Introduces F.I.T.T principle from ACSM for exercise prescriptions.  Reviews F.I.T.T. principles of aerobic exercise including progression. Written material given at graduation.   Education: Resistance Exercise: - Group verbal and visual presentation on the components of exercise prescription. Introduces F.I.T.T principle from ACSM for exercise prescriptions  Reviews F.I.T.T. principles of resistance exercise including progression. Written material given at graduation. Flowsheet Row Pulmonary Rehab from 07/28/2020 in [facility] Cardiac and Pulmonary Rehab  Date 05/12/20  Educator AS  Instruction Review Code 1- Verbalizes Understanding       Education: Exercise & Equipment Safety: - Individual verbal instruction and demonstration of equipment use and safety with use of the equipment. Flowsheet Row Pulmonary Rehab from 07/28/2020 in [facility] Cardiac and Pulmonary Rehab  Date 03/30/20  Educator [facility]  Instruction Review Code 1- Verbalizes Understanding      Education: Exercise Physiology & General Exercise Guidelines: - Group verbal and written instruction with models to review the exercise physiology of the cardiovascular system and associated critical values. Provides general exercise guidelines with specific guidelines to those with  heart or lung disease.    Education: Flexibility, Balance, Mind/Body Relaxation: - Group verbal and visual presentation with interactive activity on the components of exercise prescription. Introduces F.I.T.T principle from ACSM for exercise prescriptions. Reviews F.I.T.T. principles of flexibility and balance exercise training including progression. Also discusses the mind body connection.  Reviews various relaxation techniques to help reduce and manage stress (i.e. Deep breathing, progressive muscle relaxation, and visualization). Balance handout provided to take home. Written material given at graduation.   Activity Barriers & Risk Stratification:  Activity Barriers & Cardiac Risk Stratification - 04/13/20 1550      Activity Barriers & Cardiac Risk Stratification   Activity Barriers Deconditioning;Muscular Weakness;Shortness of Breath;Assistive Device;Other (comment)    Comments uses scooter now due to SOB and fatigue           6 Minute Walk:  6 Minute Walk    Row Name 04/13/20 1545         6 Minute Walk   Phase Initial     Distance 270 feet     Walk Time 2 minutes     # of Rest Breaks 2  2:47, 1:23     MPH 1.53     METS 2.06     RPE 20     Perceived Dyspnea  4     VO2 Peak 7.2     Symptoms Yes (comment)     Comments SOB, fatigue, chest tightness from breathing     Resting HR 94 bpm  initially 77% on low tank     Resting BP 132/78     Resting Oxygen Saturation  99 %     Exercise Oxygen Saturation  during 6 min walk 84 %     Max Ex. HR 125 bpm     Max Ex. BP 152/84     2 Minute Post BP 132/76           Interval HR   1 Minute HR 112     2 Minute HR 123     3 Minute HR 118     4 Minute HR 121     5 Minute HR 122     6 Minute HR 125     2 Minute Post HR 114     Interval Heart Rate?  Yes           Interval Oxygen   Interval Oxygen? Yes     Baseline Oxygen Saturation % 99 %     1 Minute Oxygen Saturation % 98 %     1 Minute Liters of Oxygen 6 L  continuous  e-tank     2 Minute Oxygen Saturation % 86 %  seated     2 Minute Liters of Oxygen 6 L     3 Minute Oxygen Saturation % 88 %  seated     3 Minute Liters of Oxygen 6 L     4 Minute Oxygen Saturation % 93 %     4 Minute Liters of Oxygen 6 L     5 Minute Oxygen Saturation % 90 %  seated     5 Minute Liters of Oxygen 6 L     6 Minute Oxygen Saturation % 87 %  low at 84% at 2mn     6 Minute Liters of Oxygen 6 L     2 Minute Post Oxygen Saturation % 91 %     2 Minute Post Liters of Oxygen 6 L           Oxygen Initial Assessment:  Oxygen Initial Assessment - 04/13/20 1555      Home Oxygen   Home Oxygen Device Home Concentrator;Portable Concentrator;E-Tanks    Sleep Oxygen Prescription Continuous    Liters per minute 5    Home Exercise Oxygen Prescription Continuous    Liters per minute 5    Home Resting Oxygen Prescription Continuous    Liters per minute 5    Compliance with Home Oxygen Use Yes      Initial 6 min Walk   Oxygen Used Continuous;E-Tanks    Liters per minute 6      Program Oxygen Prescription   Program Oxygen Prescription Continuous;E-Tanks    Liters per minute 6      Intervention   Short Term Goals To learn and exhibit compliance with exercise, home and travel O2 prescription;To learn and understand importance of monitoring SPO2 with pulse oximeter and demonstrate accurate use of the pulse oximeter.;To learn and understand importance of maintaining oxygen saturations>88%;To learn and demonstrate proper pursed lip breathing techniques or other breathing techniques.;To learn and demonstrate proper use of respiratory medications    Long  Term Goals Exhibits compliance with exercise, home and travel O2 prescription;Verbalizes importance of monitoring SPO2 with pulse oximeter and return demonstration;Maintenance of O2 saturations>88%;Exhibits proper breathing techniques, such as pursed lip breathing or other method taught during program session;Compliance with respiratory  medication;Demonstrates proper use of MDI's           Oxygen Re-Evaluation:  Oxygen Re-Evaluation    Row Name 04/29/20 1414             Program Oxygen Prescription   Program Oxygen Prescription Continuous;E-Tanks       Liters per minute 6               Home Oxygen   Sleep Oxygen Prescription Continuous       Liters per minute 5       Home Exercise Oxygen Prescription Continuous       Liters per minute 5       Home Resting Oxygen Prescription Continuous       Liters per minute 5       Compliance with Home Oxygen Use Yes  Goals/Expected Outcomes   Short Term Goals To learn and exhibit compliance with exercise, home and travel O2 prescription;To learn and understand importance of monitoring SPO2 with pulse oximeter and demonstrate accurate use of the pulse oximeter.;To learn and understand importance of maintaining oxygen saturations>88%;To learn and demonstrate proper pursed lip breathing techniques or other breathing techniques.;To learn and demonstrate proper use of respiratory medications       Long  Term Goals Exhibits compliance with exercise, home and travel O2 prescription;Verbalizes importance of monitoring SPO2 with pulse oximeter and return demonstration;Maintenance of O2 saturations>88%;Exhibits proper breathing techniques, such as pursed lip breathing or other method taught during program session;Compliance with respiratory medication;Demonstrates proper use of MDI's       Comments Reviewed PLB technique with pt.  Talked about how it works and it's importance in maintaining their exercise saturations.       Goals/Expected Outcomes Short: Become more profiecient at using PLB.   Long: Become independent at using PLB.              Oxygen Discharge (Final Oxygen Re-Evaluation):  Oxygen Re-Evaluation - 04/29/20 1414      Program Oxygen Prescription   Program Oxygen Prescription Continuous;E-Tanks    Liters per minute 6      Home Oxygen   Sleep Oxygen  Prescription Continuous    Liters per minute 5    Home Exercise Oxygen Prescription Continuous    Liters per minute 5    Home Resting Oxygen Prescription Continuous    Liters per minute 5    Compliance with Home Oxygen Use Yes      Goals/Expected Outcomes   Short Term Goals To learn and exhibit compliance with exercise, home and travel O2 prescription;To learn and understand importance of monitoring SPO2 with pulse oximeter and demonstrate accurate use of the pulse oximeter.;To learn and understand importance of maintaining oxygen saturations>88%;To learn and demonstrate proper pursed lip breathing techniques or other breathing techniques.;To learn and demonstrate proper use of respiratory medications    Long  Term Goals Exhibits compliance with exercise, home and travel O2 prescription;Verbalizes importance of monitoring SPO2 with pulse oximeter and return demonstration;Maintenance of O2 saturations>88%;Exhibits proper breathing techniques, such as pursed lip breathing or other method taught during program session;Compliance with respiratory medication;Demonstrates proper use of MDI's    Comments Reviewed PLB technique with pt.  Talked about how it works and it's importance in maintaining their exercise saturations.    Goals/Expected Outcomes Short: Become more profiecient at using PLB.   Long: Become independent at using PLB.           Initial Exercise Prescription:  Initial Exercise Prescription - 04/13/20 1500      Date of Initial Exercise RX and Referring Provider   Date 04/13/20    Referring Provider Vonita Moss MD      Oxygen   Oxygen Continuous    Liters 6      Recumbant Bike   Level 1    RPM 50    Watts 10    Minutes 15    METs 2      NuStep   Level 1    SPM 80    Minutes 15    METs 2      Arm Ergometer   Level 1    RPM 30    Minutes 15    METs 2      Biostep-RELP   Level 1    SPM 50    Minutes 15  METs 2      Prescription Details   Frequency  (times per week) 3    Duration Progress to 30 minutes of continuous aerobic without signs/symptoms of physical distress      Intensity   THRR 40-80% of Max Heartrate 122-150    Ratings of Perceived Exertion 11-13    Perceived Dyspnea 0-4      Progression   Progression Continue to progress workloads to maintain intensity without signs/symptoms of physical distress.      Resistance Training   Training Prescription Yes    Weight 3 lb    Reps 10-15           Perform Capillary Blood Glucose checks as needed.  Exercise Prescription Changes:  Exercise Prescription Changes    Row Name 04/13/20 1500 05/12/20 1000           Response to Exercise   Blood Pressure (Admit) 132/78 132/64      Blood Pressure (Exercise) 152/84 124/64      Blood Pressure (Exit) 112/60 102/64      Heart Rate (Admit) 94 bpm 100 bpm      Heart Rate (Exercise) 1255 bpm 108 bpm      Heart Rate (Exit) 104 bpm 93 bpm      Oxygen Saturation (Admit) 99 % 90 %      Oxygen Saturation (Exercise) 84 % 89 %      Oxygen Saturation (Exit) 97 % 95 %      Rating of Perceived Exertion (Exercise) 20 17      Perceived Dyspnea (Exercise) 4 3      Symptoms SOB, fatigue SOB, fatigue      Comments walk test results --      Duration -- Progress to 30 minutes of  aerobic without signs/symptoms of physical distress      Intensity -- THRR unchanged             Progression   Progression -- Continue to progress workloads to maintain intensity without signs/symptoms of physical distress.      Average METs -- 2.35             Resistance Training   Training Prescription -- Yes      Weight -- 3 lb      Reps -- 10-15             Oxygen   Oxygen -- Continuous      Liters -- 6             NuStep   Level -- 1      Minutes -- 15      METs -- 2.7             Biostep-RELP   Level -- 1      Minutes -- 15      METs -- 2             Exercise Comments:   Exercise Goals and Review:  Exercise Goals    Row Name  04/13/20 1552             Exercise Goals   Increase Physical Activity Yes       Intervention Provide advice, education, support and counseling about physical activity/exercise needs.;Develop an individualized exercise prescription for aerobic and resistive training based on initial evaluation findings, risk stratification, comorbidities and participant's personal goals.       Expected Outcomes Short Term: Attend rehab on a regular basis to increase amount of physical  activity.;Long Term: Add in home exercise to make exercise part of routine and to increase amount of physical activity.;Long Term: Exercising regularly at least 3-5 days a week.       Increase Strength and Stamina Yes       Intervention Provide advice, education, support and counseling about physical activity/exercise needs.;Develop an individualized exercise prescription for aerobic and resistive training based on initial evaluation findings, risk stratification, comorbidities and participant's personal goals.       Expected Outcomes Short Term: Increase workloads from initial exercise prescription for resistance, speed, and METs.;Short Term: Perform resistance training exercises routinely during rehab and add in resistance training at home;Long Term: Improve cardiorespiratory fitness, muscular endurance and strength as measured by increased METs and functional capacity (6MWT)       Able to understand and use rate of perceived exertion (RPE) scale Yes       Intervention Provide education and explanation on how to use RPE scale       Expected Outcomes Short Term: Able to use RPE daily in rehab to express subjective intensity level;Long Term:  Able to use RPE to guide intensity level when exercising independently       Able to understand and use Dyspnea scale Yes       Intervention Provide education and explanation on how to use Dyspnea scale       Expected Outcomes Short Term: Able to use Dyspnea scale daily in rehab to express  subjective sense of shortness of breath during exertion;Long Term: Able to use Dyspnea scale to guide intensity level when exercising independently       Knowledge and understanding of Target Heart Rate Range (THRR) Yes       Intervention Provide education and explanation of THRR including how the numbers were predicted and where they are located for reference       Expected Outcomes Short Term: Able to state/look up THRR;Long Term: Able to use THRR to govern intensity when exercising independently;Short Term: Able to use daily as guideline for intensity in rehab       Able to check pulse independently Yes       Intervention Provide education and demonstration on how to check pulse in carotid and radial arteries.;Review the importance of being able to check your own pulse for safety during independent exercise       Expected Outcomes Short Term: Able to explain why pulse checking is important during independent exercise;Long Term: Able to check pulse independently and accurately       Understanding of Exercise Prescription Yes       Intervention Provide education, explanation, and written materials on patient's individual exercise prescription       Expected Outcomes Short Term: Able to explain program exercise prescription;Long Term: Able to explain home exercise prescription to exercise independently              Exercise Goals Re-Evaluation :  Exercise Goals Re-Evaluation    [place] Name 04/29/20 1413 05/12/20 1042 05/13/20 1445 05/26/20 1414 06/08/20 1554     Exercise Goal Re-Evaluation   Exercise Goals Review Increase Physical Activity;Able to understand and use rate of perceived exertion (RPE) scale;Knowledge and understanding of Target Heart Rate Range (THRR);Understanding of Exercise Prescription;Increase Strength and Stamina;Able to understand and use Dyspnea scale;Able to check pulse independently Increase Physical Activity;Increase Strength and Stamina;Understanding of Exercise  Prescription Increase Physical Activity;Increase Strength and Stamina -- --   Comments Reviewed RPE and dyspnea scales, THR and program prescription with  pt today.  Pt voiced understanding and was given a copy of goals to take home. Kennyth Lose has completed her first two full days of exercise.  She has had some other things come up, including her sugars, that have interferred with her attendance.  We will contiue to monitor her progress and encourage improved attendance. Pt able to follow exercise prescription today without complaint.  Will continue to monitor for progression.  Reviewed home exercise with pt today.  Pt plans to use bike/arm machine at home for exercise.  Reviewed THR, pulse, RPE, sign and symptoms, pulse oximetery and when to call 911 or MD.  Also discussed weather considerations and indoor options.  Pt voiced understanding. Called to check on Jackie.  She has been having a hard time with her feet being swollen and possibly around her lungs as well.  She has started some fluid medication and to help with swelling.  We talked about keeping feet elevated when not moving.  She has a follow up call tomorrow and possible appt on Friday.  We talked about returning to rehab next week to keep moving and fluid moving. Out since last review   Expected Outcomes Short: Use RPE daily to regulate intensity. Long: Follow program prescription in THR. Short: Improved reguarly attendance Long: Continue to follow program prescription. Short :  monitor HR and oxygen when exercising at home Long:  improve overall MET level -- --          Discharge Exercise Prescription (Final Exercise Prescription Changes):  Exercise Prescription Changes - 05/12/20 1000      Response to Exercise   Blood Pressure (Admit) 132/64    Blood Pressure (Exercise) 124/64    Blood Pressure (Exit) 102/64    Heart Rate (Admit) 100 bpm    Heart Rate (Exercise) 108 bpm    Heart Rate (Exit) 93 bpm    Oxygen Saturation (Admit) 90 %     Oxygen Saturation (Exercise) 89 %    Oxygen Saturation (Exit) 95 %    Rating of Perceived Exertion (Exercise) 17    Perceived Dyspnea (Exercise) 3    Symptoms SOB, fatigue    Duration Progress to 30 minutes of  aerobic without signs/symptoms of physical distress    Intensity THRR unchanged      Progression   Progression Continue to progress workloads to maintain intensity without signs/symptoms of physical distress.    Average METs 2.35      Resistance Training   Training Prescription Yes    Weight 3 lb    Reps 10-15      Oxygen   Oxygen Continuous    Liters 6      NuStep   Level 1    Minutes 15    METs 2.7      Biostep-RELP   Level 1    Minutes 15    METs 2           Nutrition:  Target Goals: Understanding of nutrition guidelines, daily intake of sodium <1571m, cholesterol <2029m calories 30% from fat and 7% or less from saturated fats, daily to have 5 or more servings of fruits and vegetables.  Education: All About Nutrition: -Group instruction provided by verbal, written material, interactive activities, discussions, models, and posters to present general guidelines for heart healthy nutrition including fat, fiber, MyPlate, the role of sodium in heart healthy nutrition, utilization of the nutrition label, and utilization of this knowledge for meal planning. Follow up email sent as well. Written material given  at graduation. Flowsheet Row Pulmonary Rehab from 07/28/2020 in [facility] Cardiac and Pulmonary Rehab  Education need identified 04/13/20  Date 07/28/20  Educator [place]  Instruction Review Code 1- Verbalizes Understanding      Biometrics:  Pre Biometrics - 04/13/20 1553      Pre Biometrics   Height 5' 11.1" (1.806 m)    Weight 232 lb 4.8 oz (105.4 kg)    BMI (Calculated) 32.31    Single Leg Stand --   did not do due to SOB/fatigue           Nutrition Therapy Plan and Nutrition Goals:   Nutrition Assessments:  MEDIFICTS Score Key:  ?70 Need to make  dietary changes   40-70 Heart Healthy Diet  ? 40 Therapeutic Level Cholesterol Diet  Flowsheet Row Pulmonary Rehab from 04/13/2020 in [facility] Cardiac and Pulmonary Rehab  Picture Your Plate Total Score on Admission 54     Picture Your Plate Scores:  <10 Unhealthy dietary pattern with much room for improvement.  41-50 Dietary pattern unlikely to meet recommendations for good health and room for improvement.  51-60 More healthful dietary pattern, with some room for improvement.   >60 Healthy dietary pattern, although there may be some specific behaviors that could be improved.   Nutrition Goals Re-Evaluation:   Nutrition Goals Discharge (Final Nutrition Goals Re-Evaluation):   Psychosocial: Target Goals: Acknowledge presence or absence of significant depression and/or stress, maximize coping skills, provide positive support system. Participant is able to verbalize types and ability to use techniques and skills needed for reducing stress and depression.   Education: Stress, Anxiety, and Depression - Group verbal and visual presentation to define topics covered.  Reviews how body is impacted by stress, anxiety, and depression.  Also discusses healthy ways to reduce stress and to treat/manage anxiety and depression.  Written material given at graduation.   Education: Sleep Hygiene -Provides group verbal and written instruction about how sleep can affect your health.  Define sleep hygiene, discuss sleep cycles and impact of sleep habits. Review good sleep hygiene tips.    Initial Review & Psychosocial Screening:  Initial Psych Review & Screening - 03/30/20 0945      Initial Review   Current issues with Current Stress Concerns    Source of Stress Concerns Chronic Illness      Family Dynamics   Good Support System? Yes    Comments Kennyth Lose can look to her parents and brother for support. She states that she has no stress concerns other than she cannot get around and is limited to  doing daily activities.      Barriers   Psychosocial barriers to participate in program The patient should benefit from training in stress management and relaxation.      Screening Interventions   Interventions To provide support and resources with identified psychosocial needs;Provide feedback about the scores to participant;Encouraged to exercise    Expected Outcomes Short Term goal: Utilizing psychosocial counselor, staff and physician to assist with identification of specific Stressors or current issues interfering with healing process. Setting desired goal for each stressor or current issue identified.;Long Term Goal: Stressors or current issues are controlled or eliminated.;Short Term goal: Identification and review with participant of any Quality of Life or Depression concerns found by scoring the questionnaire.;Long Term goal: The participant improves quality of Life and PHQ9 Scores as seen by post scores and/or verbalization of changes           Quality of Life Scores:  Scores  of 19 and below usually indicate a poorer quality of life in these areas.  A difference of  2-3 points is a clinically meaningful difference.  A difference of 2-3 points in the total score of the Quality of Life Index has been associated with significant improvement in overall quality of life, self-image, physical symptoms, and general health in studies assessing change in quality of life.  PHQ-9: Recent Review Flowsheet Data    Depression screen [facility] 2/9 04/13/2020 03/06/2017   Decreased Interest 3 0   Down, Depressed, Hopeless 1 0   PHQ - 2 Score 4 0   Altered sleeping 3 3   Tired, decreased energy 3 3   Change in appetite 0 2   Feeling bad or failure about yourself  0 0   Trouble concentrating 0 0   Moving slowly or fidgety/restless 0 0   Suicidal thoughts 0 0   PHQ-9 Score 10 8   Difficult doing work/chores Very difficult Somewhat difficult     Interpretation of Total Score  Total Score Depression  Severity:  1-4 = Minimal depression, 5-9 = Mild depression, 10-14 = Moderate depression, 15-19 = Moderately severe depression, 20-27 = Severe depression   Psychosocial Evaluation and Intervention:  Psychosocial Evaluation - 03/30/20 0947      Psychosocial Evaluation & Interventions   Interventions Stress management education;Relaxation education;Encouraged to exercise with the program and follow exercise prescription    Comments Kennyth Lose can look to her parents and brother for support. She states that she has no stress concerns other than she cannot get around and is limited to doing daily activities.    Expected Outcomes Short: Exercise regularly to support mental health and notify staff of any changes. Long: maintain mental health and well being through teaching of rehab or prescribed medications independently.    Continue Psychosocial Services  Follow up required by staff           Psychosocial Re-Evaluation:   Psychosocial Discharge (Final Psychosocial Re-Evaluation):   Education: Education Goals: Education classes will be provided on a weekly basis, covering required topics. Participant will state understanding/return demonstration of topics presented.  Learning Barriers/Preferences:  Learning Barriers/Preferences - 03/30/20 0944      Learning Barriers/Preferences   Learning Barriers None    Learning Preferences None           General Pulmonary Education Topics:  Infection Prevention: - Provides verbal and written material to individual with discussion of infection control including proper hand washing and proper equipment cleaning during exercise session. Flowsheet Row Pulmonary Rehab from 07/28/2020 in [facility] Cardiac and Pulmonary Rehab  Date 03/30/20  Educator [facility]  Instruction Review Code 1- Verbalizes Understanding      Falls Prevention: - Provides verbal and written material to individual with discussion of falls prevention and safety. Flowsheet Row Pulmonary  Rehab from 07/28/2020 in [facility] Cardiac and Pulmonary Rehab  Date 03/30/20  Educator [facility]  Instruction Review Code 1- Verbalizes Understanding      Chronic Lung Disease Review: - Group verbal instruction with posters, models, PowerPoint presentations and videos,  to review new updates, new respiratory medications, new advancements in procedures and treatments. Providing information on websites and "800" numbers for continued self-education. Includes information about supplement oxygen, available portable oxygen systems, continuous and intermittent flow rates, oxygen safety, concentrators, and Medicare reimbursement for oxygen. Explanation of Pulmonary Drugs, including class, frequency, complications, importance of spacers, rinsing mouth after steroid MDI's, and proper cleaning methods for nebulizers. Review of basic lung anatomy  and physiology related to function, structure, and complications of lung disease. Review of risk factors. Discussion about methods for diagnosing sleep apnea and types of masks and machines for OSA. Includes a review of the use of types of environmental controls: home humidity, furnaces, filters, dust mite/pet prevention, HEPA vacuums. Discussion about weather changes, air quality and the benefits of nasal washing. Instruction on Warning signs, infection symptoms, calling MD promptly, preventive modes, and value of vaccinations. Review of effective airway clearance, coughing and/or vibration techniques. Emphasizing that all should Create an Action Plan. Written material given at graduation. Flowsheet Row Pulmonary Rehab from 07/28/2020 in [facility] Cardiac and Pulmonary Rehab  Education need identified 04/13/20      AED/CPR: - Group verbal and written instruction with the use of models to demonstrate the basic use of the AED with the basic ABC's of resuscitation.    Anatomy and Cardiac Procedures: - Group verbal and visual presentation and models provide information about basic  cardiac anatomy and function. Reviews the testing methods done to diagnose heart disease and the outcomes of the test results. Describes the treatment choices: Medical Management, Angioplasty, or Coronary Bypass Surgery for treating various heart conditions including Myocardial Infarction, Angina, Valve Disease, and Cardiac Arrhythmias.  Written material given at graduation. Flowsheet Row Pulmonary Rehab from 07/28/2020 in [facility] Cardiac and Pulmonary Rehab  Date 05/12/20  Educator [facility]  Instruction Review Code 1- Verbalizes Understanding      Medication Safety: - Group verbal and visual instruction to review commonly prescribed medications for heart and lung disease. Reviews the medication, class of the drug, and side effects. Includes the steps to properly store meds and maintain the prescription regimen.  Written material given at graduation.   Other: -Provides group and verbal instruction on various topics (see comments)   Knowledge Questionnaire Score:  Knowledge Questionnaire Score - 04/13/20 1554      Knowledge Questionnaire Score   Pre Score 16/18 Education Focus: Nutriton and O2 safety            Core Components/Risk Factors/Patient Goals at Admission:  Personal Goals and Risk Factors at Admission - 04/13/20 1554      Core Components/Risk Factors/Patient Goals on Admission    Weight Management Yes;Weight Loss;Obesity    Intervention Weight Management: Develop a combined nutrition and exercise program designed to reach desired caloric intake, while maintaining appropriate intake of nutrient and fiber, sodium and fats, and appropriate energy expenditure required for the weight goal.;Weight Management: Provide education and appropriate resources to help participant work on and attain dietary goals.;Weight Management/Obesity: Establish reasonable short term and long term weight goals.;Obesity: Provide education and appropriate resources to help participant work on and attain dietary  goals.    Admit Weight 232 lb 4.8 oz (105.4 kg)    Goal Weight: Short Term 225 lb (102.1 kg)    Goal Weight: Long Term 220 lb (99.8 kg)    Expected Outcomes Short Term: Continue to assess and modify interventions until short term weight is achieved;Long Term: Adherence to nutrition and physical activity/exercise program aimed toward attainment of established weight goal;Weight Loss: Understanding of general recommendations for a balanced deficit meal plan, which promotes 1-2 lb weight loss per week and includes a negative energy balance of [phone] kcal/d;Understanding recommendations for meals to include 15-35% energy as protein, 25-35% energy from fat, 35-60% energy from carbohydrates, less than 258m of dietary cholesterol, 20-35 gm of total fiber daily;Understanding of distribution of calorie intake throughout the day with the consumption of 4-5  meals/snacks    Improve shortness of breath with ADL's Yes    Intervention Provide education, individualized exercise plan and daily activity instruction to help decrease symptoms of SOB with activities of daily living.    Expected Outcomes Short Term: Improve cardiorespiratory fitness to achieve a reduction of symptoms when performing ADLs;Long Term: Be able to perform more ADLs without symptoms or delay the onset of symptoms    Diabetes Yes    Intervention Provide education about signs/symptoms and action to take for hypo/hyperglycemia.;Provide education about proper nutrition, including hydration, and aerobic/resistive exercise prescription along with prescribed medications to achieve blood glucose in normal ranges: Fasting glucose 65-99 mg/dL    Expected Outcomes Short Term: Participant verbalizes understanding of the signs/symptoms and immediate care of hyper/hypoglycemia, proper foot care and importance of medication, aerobic/resistive exercise and nutrition plan for blood glucose control.;Long Term: Attainment of HbA1C < 7%.    Hypertension Yes     Intervention Provide education on lifestyle modifcations including regular physical activity/exercise, weight management, moderate sodium restriction and increased consumption of fresh fruit, vegetables, and low fat dairy, alcohol moderation, and smoking cessation.;Monitor prescription use compliance.    Expected Outcomes Long Term: Maintenance of blood pressure at goal levels.;Short Term: Continued assessment and intervention until BP is < 140/38m HG in hypertensive participants. < 130/832mHG in hypertensive participants with diabetes, heart failure or chronic kidney disease.    Lipids Yes    Intervention Provide education and support for participant on nutrition & aerobic/resistive exercise along with prescribed medications to achieve LDL <7066mHDL >38m76m  Expected Outcomes Short Term: Participant states understanding of desired cholesterol values and is compliant with medications prescribed. Participant is following exercise prescription and nutrition guidelines.;Long Term: Cholesterol controlled with medications as prescribed, with individualized exercise RX and with personalized nutrition plan. Value goals: LDL < 70mg7mL > 40 mg.           Education:Diabetes - Individual verbal and written instruction to review signs/symptoms of diabetes, desired ranges of glucose level fasting, after meals and with exercise. Acknowledge that pre and post exercise glucose checks will be done for 3 sessions at entry of program. Flowsheet Row [facility]monary [facility]  Date 04/13/20  [facility] Code 1- Verbalizes Understanding      Know Your Numbers and Heart Failure: - Group verbal and visual instruction to discuss disease risk factors for cardiac and pulmonary disease and treatment options.  Reviews associated critical values for Overweight/Obesity, Hypertension, Cholesterol, and Diabetes.  Discusses basics of heart failure: signs/symptoms  and treatments.  Introduces Heart Failure Zone chart for action plan for heart failure.  Written material given at graduation.   Core Components/Risk Factors/Patient Goals Review:    Core Components/Risk Factors/Patient Goals at Discharge (Final Review):    ITP Comments:  ITP Comments    Row Name 03/30/20 0941 04/13/20 1544 04/14/20 0937 04/29/20 1411 05/12/20 0615   ITP Comments Virtual Visit completed. Patient informed on EP and RD appointment and 6 Minute walk test. Patient also informed of patient health questionnaires on My Chart. Patient Verbalizes understanding. Visit diagnosis can be found in CH[facility]10/29/2021. Completed 6MWT and gym orientation. Initial ITP created and sent for review to Dr. Mark Emily Filbertical Director. 30 Day review completed. Medical Director ITP review done, changes made as directed, and signed approval by Medical Director.   New to program First full day of exercise!  Patient was oriented to gym and  equipment including functions, settings, policies, and procedures.  Patient's individual exercise prescription and treatment plan were reviewed.  All starting workloads were established based on the results of the 6 minute walk test done at initial orientation visit.  The plan for exercise progression was also introduced and progression will be customized based on patient's performance and goals. 30 Day review completed. Medical Director ITP review done, changes made as directed, and signed approval by Medical Director.   [place] Name 05/26/20 1413 06/08/20 1553 06/09/20 0721 06/21/20 1600 07/06/20 0800   ITP Comments Called to check on [place].  She has been having a hard time with her feet being swollen and possibly around her lungs as well.  She has started some fluid medication and to help with swelling.  We talked about keeping feet elevated when not moving.  She has a follow up call tomorrow and possible appt on Friday.  We talked about returning to rehab next week to keep  moving and fluid moving. Called to check on pt.  Out since 05/13/20.  Left message. 30 Day review completed. Medical Director ITP review done, changes made as directed, and signed approval by Medical Director. 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  Her f/u is scheduled for 06/24/20, will need clearance to return. Patient has not been to rehba since last review. - Toe amputated 3/30, f/u was sch for 4/7. Have not heard back regarding follow up or clearance.   [place] Name 07/07/20 0937 07/22/20 1518 08/03/20 1051 08/04/20 0631     ITP Comments 30 Day review completed. Medical Director ITP review done, changes made as directed, and signed approval by Medical Director. Kennyth Lose has not attended since last review Kennyth Lose just came back to rehab 5/11; previously has not attended since February. She cancelled the RD call 5/16 and did not come to class 5/16 with no call. Unable to complete goals. 30 Day review completed. Medical Director ITP review done, changes made as directed, and signed approval by Medical Director.           Comments:

## 2020-08-05 DIAGNOSIS — I272 Pulmonary hypertension, unspecified: Principal | ICD-10-CM

## 2020-08-05 MED ORDER — INSULIN GLARGINE (U-100) 100 UNIT/ML (3 ML) SUBCUTANEOUS PEN
Freq: Every evening | SUBCUTANEOUS | 1 refills | 87.00000 days
Start: 2020-08-05 — End: 2021-08-05

## 2020-08-05 MED ORDER — SILDENAFIL (PULMONARY HYPERTENSION) 20 MG TABLET
ORAL_TABLET | Freq: Three times a day (TID) | ORAL | 1 refills | 90.00000 days
Start: 2020-08-05 — End: 2021-08-05

## 2020-08-10 ENCOUNTER — Ambulatory Visit: Admit: 2020-08-10 | Payer: MEDICARE

## 2020-08-10 NOTE — Unmapped (Signed)
[facility] Refill Coordination Note    Specialty Medication(s) to be Shipped:   CF/Pulmonary: -Nucala    Other medication(s) to be shipped: acetaminophen     Karen Barber, DOB: [birth year]  Phone: [phone] (home) [phone] (work)      All above HIPAA information was verified with patient.     Was a Nurse, learning disability used for this call? No    Completed refill call assessment today to schedule patient's medication shipment from the [facility] [phone]).  All relevant notes have been reviewed.     Specialty medication(s) and dose(s) confirmed: Regimen is correct and unchanged.   Changes to medications: Karen Barber reports no changes at this time.  Changes to insurance: No  New side effects reported not previously addressed with a pharmacist or physician: None reported  Questions for the pharmacist: No    Confirmed patient received a Conservation officer, historic buildings and a Surveyor, mining with first shipment. The patient will receive a drug information handout for each medication shipped and additional FDA Medication Guides as required.       DISEASE/MEDICATION-SPECIFIC INFORMATION        N/A    SPECIALTY MEDICATION ADHERENCE     Medication Adherence    Patient reported X missed doses in the last month: 0  Specialty Medication: Nucala 100mg /ml  Patient is on additional specialty medications: No  Patient is on more than two specialty medications: No  Informant: patient  Reliability of informant: reliable              Were doses missed due to medication being on hold? No    nucala 100mg /ml  : 6 days of medicine on hand       REFERRAL TO PHARMACIST     Referral to the pharmacist: Not needed      [facility]     Shipping address confirmed in Epic.     Delivery Scheduled: Yes, Expected medication delivery date: 5/26.     Medication will be delivered via Same Day Courier to the prescription address in Epic WAM.    Westley Gambles   [facility] Specialty Technician

## 2020-08-11 ENCOUNTER — Other Ambulatory Visit: Payer: Self-pay

## 2020-08-11 DIAGNOSIS — D869 Sarcoidosis, unspecified: Secondary | ICD-10-CM

## 2020-08-11 LAB — GLUCOSE, CAPILLARY
Glucose-Capillary: 225 mg/dL — ABNORMAL HIGH (ref 70–99)
Glucose-Capillary: 184 mg/dL — ABNORMAL HIGH (ref 70–99)

## 2020-08-11 NOTE — Progress Notes (Signed)
Daily Session Note  Patient Details  Name: LAXMI CHOUNG MRN: [id number] Date of Birth: 06/08/1965 Referring Provider:   Flowsheet Row Pulmonary Rehab from 04/13/2020 in [facility] Cardiac and Pulmonary Rehab  Referring Provider Vonita Moss MD      Encounter Date: 08/11/2020  Check In:  Session Check In - 08/11/20 1408      Check-In   Supervising physician immediately available to respond to emergencies See telemetry face sheet for immediately available ER MD    Location [facility]-Cardiac & Pulmonary Rehab    Staff Present Birdie Sons, MPA, RN;Joseph Lou Miner, MS, ASCM CEP, Exercise Physiologist    Virtual Visit No    Medication changes reported     No    Fall or balance concerns reported    No    Warm-up and Cool-down Performed on first and last piece of equipment    Resistance Training Performed Yes    VAD Patient? No    PAD/SET Patient? No      Pain Assessment   Currently in Pain? No/denies              Social History   Tobacco Use  Smoking Status Former Smoker  . Packs/day: 0.25  . Years: 6.00  . Pack years: 1.50  . Types: Cigarettes  . Quit date: 03/21/1987  . Years since quitting: 33.4  Smokeless Tobacco Never Used    Goals Met:  Independence with exercise equipment Exercise tolerated well No report of cardiac concerns or symptoms Strength training completed today  Goals Unmet:  Not Applicable  Comments: Pt able to follow exercise prescription today without complaint.  Will continue to monitor for progression.    Dr. Emily Filbert is Medical Director for [place].  Dr. Ottie Glazier is Medical Director for [facility] Pulmonary Rehabilitation.

## 2020-08-12 DIAGNOSIS — D869 Sarcoidosis, unspecified: Principal | ICD-10-CM

## 2020-08-12 DIAGNOSIS — J449 Chronic obstructive pulmonary disease, unspecified: Principal | ICD-10-CM

## 2020-08-12 DIAGNOSIS — B441 Other pulmonary aspergillosis: Principal | ICD-10-CM

## 2020-08-12 MED ORDER — ALBUTEROL SULFATE 2.5 MG/3 ML (0.083 %) SOLUTION FOR NEBULIZATION
Freq: Four times a day (QID) | RESPIRATORY_TRACT | 1 refills | 10 days | Status: CP | PRN
Start: 2020-08-12 — End: 2021-08-12

## 2020-08-12 MED ORDER — ALBUTEROL SULFATE HFA 90 MCG/ACTUATION AEROSOL INHALER
RESPIRATORY_TRACT | 1 refills | 0 days | Status: CP | PRN
Start: 2020-08-12 — End: 2021-08-12

## 2020-08-12 MED ORDER — GABAPENTIN 300 MG CAPSULE
ORAL_CAPSULE | Freq: Three times a day (TID) | ORAL | 1 refills | 90 days | Status: CP
Start: 2020-08-12 — End: 2021-08-13

## 2020-08-12 MED ORDER — POSACONAZOLE 100 MG TABLET,DELAYED RELEASE
ORAL_TABLET | Freq: Every day | ORAL | 2 refills | 30 days | Status: CP
Start: 2020-08-12 — End: ?

## 2020-08-12 MED FILL — NUCALA 100 MG/ML SUBCUTANEOUS AUTO-INJECTOR: SUBCUTANEOUS | 28 days supply | Qty: 1 | Fill #1

## 2020-08-12 MED FILL — ACETAMINOPHEN 325 MG TABLET: ORAL | 3 days supply | Qty: 30 | Fill #0

## 2020-08-19 ENCOUNTER — Encounter: Payer: Medicare Other | Attending: Critical Care Medicine

## 2020-08-19 DIAGNOSIS — J9611 Chronic respiratory failure with hypoxia: Secondary | ICD-10-CM | POA: Insufficient documentation

## 2020-08-19 DIAGNOSIS — D860 Sarcoidosis of lung: Secondary | ICD-10-CM | POA: Insufficient documentation

## 2020-08-19 NOTE — Unmapped (Addendum)
[facility] Clinical Intervention    Type of intervention: Medication administration    Medication involved: Nucala     Problem identified: Patient reports the Nucala pen she received was defective.  She held it on the skin for over than 1 min and it didn't activate.  She took it off and tried again and it didn't work so she took it off her skin again and at that time the pen activated.  Medication squirted out at that time and she was unable to get this dose.     Intervention performed: Provided patient with phone number to contact [facility] directly ([phone]) to obtain a replacement of medication. Patient is very comfortable with the injection and knows how to inject so does not need to review technique.  Warm transferred patient to Nucala rep to provide further aid for replacement    Update: [employer] will send a replacement to [facility]. It will be received in 2 business days.  Ref# [id number].  Spoke with Chanelle B.  See refill note below as we will go ahead and send out replacement tomorrow.       Follow-up needed: as planned for future refill    Approximate time spent: 5-10 minutes    Clinical evidence used to support intervention: Drug information resource    Julianne Rice   [facility] Specialty Pharmacist        [facility] Refill Coordination Note    Specialty Medication(s) to be Shipped:   Inflammatory Disorders: Nucala 100mg /ml    Other medication(s) to be shipped: No additional medications requested for fill at this time     Deniece Ree, DOB: 1965/05/11  Phone: [phone] (home) [phone] (work)      All above HIPAA information was verified with patient.     Was a Nurse, learning disability used for this call? No    Completed refill call assessment today to schedule patient's medication shipment from the [facility] [phone]).  All relevant notes have been reviewed.     Specialty medication(s) and dose(s) confirmed: Regimen is correct and unchanged.   Changes to medications: [name] reports no changes at this time.  Changes to insurance: No  New side effects reported not previously addressed with a pharmacist or physician: Yes - Patient reports injection was defective. Patient would like to speak to the pharmacist today. Their provider is not aware.  Questions for the pharmacist: Yes: see note above    Confirmed patient received a Conservation officer, historic buildings and a Surveyor, mining with first shipment. The patient will receive a drug information handout for each medication shipped and additional FDA Medication Guides as required.       DISEASE/MEDICATION-SPECIFIC INFORMATION        For patients on injectable medications: Patient currently has 0 doses left.  Next injection is scheduled for ASAP.    SPECIALTY MEDICATION ADHERENCE          Were doses missed due to medication being on hold? Yes - defective device    Nucala 100 mg/ml: 0 days of medicine on hand       REFERRAL TO PHARMACIST     Referral to the pharmacist: Not needed      [facility]     Shipping address confirmed in Epic.     Delivery Scheduled: Yes, Expected medication delivery date: 6/3.     Medication will be delivered via Same Day Courier to the prescription address in [place].  Julianne Rice   [facility] Specialty Pharmacist

## 2020-08-23 ENCOUNTER — Other Ambulatory Visit: Payer: Self-pay

## 2020-08-23 DIAGNOSIS — D860 Sarcoidosis of lung: Secondary | ICD-10-CM | POA: Diagnosis present

## 2020-08-23 DIAGNOSIS — D869 Sarcoidosis, unspecified: Secondary | ICD-10-CM

## 2020-08-23 DIAGNOSIS — J9611 Chronic respiratory failure with hypoxia: Secondary | ICD-10-CM | POA: Diagnosis not present

## 2020-08-23 NOTE — Progress Notes (Signed)
Daily Session Note  Patient Details  Name: Charlotte Cross MRN: [id number] Date of Birth: 1965-10-22 Referring Provider:   Flowsheet Row Pulmonary Rehab from 04/13/2020 in [facility] Cardiac and Pulmonary Rehab  Referring Provider Vonita Moss MD      Encounter Date: 08/23/2020  Check In:  Session Check In - 08/23/20 1411      Check-In   Supervising physician immediately available to respond to emergencies See telemetry face sheet for immediately available ER MD    Location [facility]-Cardiac & Pulmonary Rehab    Staff Present Birdie Sons, MPA, Mauricia Area, BS, ACSM CEP, Exercise Physiologist;Jessica Luan Pulling, MA, RCEP, CCRP, CCET    Virtual Visit No    Medication changes reported     No    Fall or balance concerns reported    No    Warm-up and Cool-down Performed on first and last piece of equipment    Resistance Training Performed Yes    VAD Patient? No    PAD/SET Patient? No      Pain Assessment   Currently in Pain? No/denies              Social History   Tobacco Use  Smoking Status Former Smoker  . Packs/day: 0.25  . Years: 6.00  . Pack years: 1.50  . Types: Cigarettes  . Quit date: 03/21/1987  . Years since quitting: 33.4  Smokeless Tobacco Never Used    Goals Met:  Independence with exercise equipment Exercise tolerated well No report of cardiac concerns or symptoms Strength training completed today  Goals Unmet:  Not Applicable  Comments: Pt able to follow exercise prescription today without complaint.  Will continue to monitor for progression.    Dr. Emily Filbert is Medical Director for [place].  Dr. Ottie Glazier is Medical Director for [facility] Pulmonary Rehabilitation.

## 2020-08-25 ENCOUNTER — Other Ambulatory Visit: Payer: Self-pay

## 2020-08-25 DIAGNOSIS — D869 Sarcoidosis, unspecified: Secondary | ICD-10-CM

## 2020-08-25 DIAGNOSIS — D860 Sarcoidosis of lung: Secondary | ICD-10-CM | POA: Diagnosis not present

## 2020-08-25 NOTE — Progress Notes (Signed)
Daily Session Note  Patient Details  Name: Charlotte Cross MRN: [id number] Date of Birth: [birth year] Referring Provider:   Flowsheet Row Pulmonary Rehab from 04/13/2020 in [facility] Cardiac and Pulmonary Rehab  Referring Provider Vonita Moss MD      Encounter Date: 08/25/2020  Check In:  Session Check In - 08/25/20 1402      Check-In   Supervising physician immediately available to respond to emergencies See telemetry face sheet for immediately available ER MD    Location [facility]-Cardiac & Pulmonary Rehab    Staff Present Birdie Sons, MPA, RN;Joseph Lou Miner, MS, ASCM CEP, Exercise Physiologist    Virtual Visit No    Medication changes reported     No    Fall or balance concerns reported    No    Warm-up and Cool-down Performed on first and last piece of equipment    Resistance Training Performed Yes    VAD Patient? No    PAD/SET Patient? No      Pain Assessment   Currently in Pain? No/denies              Social History   Tobacco Use  Smoking Status Former Smoker  . Packs/day: 0.25  . Years: 6.00  . Pack years: 1.50  . Types: Cigarettes  . Quit date: 03/21/1987  . Years since quitting: 33.4  Smokeless Tobacco Never Used    Goals Met:  Independence with exercise equipment Exercise tolerated well Personal goals reviewed No report of cardiac concerns or symptoms Strength training completed today  Goals Unmet:  Not Applicable  Comments: Pt able to follow exercise prescription today without complaint.  Will continue to monitor for progression.    Dr. Emily Filbert is Medical Director for [place].  Dr. Ottie Glazier is Medical Director for [facility] Pulmonary Rehabilitation.

## 2020-08-26 ENCOUNTER — Other Ambulatory Visit: Payer: Self-pay

## 2020-08-26 DIAGNOSIS — D869 Sarcoidosis, unspecified: Secondary | ICD-10-CM

## 2020-08-26 DIAGNOSIS — D860 Sarcoidosis of lung: Secondary | ICD-10-CM | POA: Diagnosis not present

## 2020-08-26 NOTE — Progress Notes (Signed)
Daily Session Note  Patient Details  Name: Charlotte Cross MRN: [id number] Date of Birth: 12-02-1965 Referring Provider:   Flowsheet Row Pulmonary Rehab from 04/13/2020 in [facility] Cardiac and Pulmonary Rehab  Referring Provider Vonita Moss MD       Encounter Date: 08/26/2020  Check In:  Session Check In - 08/26/20 1412       Check-In   Supervising physician immediately available to respond to emergencies See telemetry face sheet for immediately available ER MD    Location [facility]-Cardiac & Pulmonary Rehab    Staff Present Birdie Sons, MPA, RN;Joseph Darrin Nipper, [place], RCEP, CCRP, CCET    Virtual Visit No    Medication changes reported     No    Fall or balance concerns reported    No    Warm-up and Cool-down Performed on first and last piece of equipment    Resistance Training Performed Yes    VAD Patient? No    PAD/SET Patient? No      Pain Assessment   Currently in Pain? No/denies                Social History   Tobacco Use  Smoking Status Former   Packs/day: 0.25   Years: 6.00   Pack years: 1.50   Types: Cigarettes   Quit date: 03/21/1987   Years since quitting: 33.4  Smokeless Tobacco Never    Goals Met:  Independence with exercise equipment Exercise tolerated well No report of cardiac concerns or symptoms Strength training completed today  Goals Unmet:  Not Applicable  Comments: Pt able to follow exercise prescription today without complaint.  Will continue to monitor for progression.    Dr. Emily Filbert is Medical Director for [place].  Dr. Ottie Glazier is Medical Director for [facility] Pulmonary Rehabilitation.

## 2020-08-27 NOTE — Unmapped (Signed)
Last sent Nucala on 08/20/20 due to last Nucala was defective. Will reach out to patient and in a couple of more weeks.

## 2020-08-28 DIAGNOSIS — I272 Pulmonary hypertension, unspecified: Principal | ICD-10-CM

## 2020-08-28 DIAGNOSIS — Z794 Long term (current) use of insulin: Principal | ICD-10-CM

## 2020-08-28 DIAGNOSIS — E1165 Type 2 diabetes mellitus with hyperglycemia: Principal | ICD-10-CM

## 2020-08-28 DIAGNOSIS — D869 Sarcoidosis, unspecified: Principal | ICD-10-CM

## 2020-08-28 MED ORDER — BLOOD SUGAR DIAGNOSTIC STRIPS
ORAL_STRIP | Freq: Three times a day (TID) | 1 refills | 0 days
Start: 2020-08-28 — End: 2021-08-29

## 2020-08-28 MED ORDER — SILDENAFIL (PULMONARY HYPERTENSION) 20 MG TABLET
ORAL_TABLET | Freq: Three times a day (TID) | ORAL | 1 refills | 90 days
Start: 2020-08-28 — End: 2021-08-28

## 2020-08-28 MED ORDER — GABAPENTIN 300 MG CAPSULE
ORAL_CAPSULE | Freq: Three times a day (TID) | ORAL | 1 refills | 90 days
Start: 2020-08-28 — End: 2021-08-29

## 2020-08-30 MED ORDER — ALBUTEROL SULFATE HFA 90 MCG/ACTUATION AEROSOL INHALER
RESPIRATORY_TRACT | 0 refills | 0 days | Status: CP | PRN
Start: 2020-08-30 — End: 2021-08-30

## 2020-08-30 MED ORDER — INSULIN GLARGINE (U-100) 100 UNIT/ML (3 ML) SUBCUTANEOUS PEN
Freq: Every evening | SUBCUTANEOUS | 1 refills | 87 days | Status: CP
Start: 2020-08-30 — End: 2021-08-30

## 2020-08-30 MED ORDER — BLOOD SUGAR DIAGNOSTIC STRIPS
ORAL_STRIP | Freq: Three times a day (TID) | 1 refills | 0 days | Status: CP
Start: 2020-08-30 — End: 2021-08-31

## 2020-08-30 NOTE — Unmapped (Signed)
Patient is requesting the following refill  Requested Prescriptions     Pending Prescriptions Disp Refills   ??? blood sugar diagnostic Strp 300 strip 1     Sig: by Other route Three (3) times a day. ACCU-CHEK Guide meter   ??? insulin glargine (BASAGLAR, LANTUS) 100 unit/mL (3 mL) injection pen 21 mL 1     Sig: Inject 0.24 mL (24 Units total) under the skin nightly.   ??? sildenafiL, pulm.hypertension, (REVATIO) 20 mg tablet 270 tablet 1     Sig: Take 1 tablet (20 mg total) by mouth Three (3) times a day.   ??? gabapentin (NEURONTIN) 300 MG capsule 270 capsule 1     Sig: Take 1 capsule (300 mg total) by mouth Three (3) times a day.   ??? albuterol HFA 90 mcg/actuation inhaler 24 g 1     Sig: Inhale 2 puffs every four (4) hours as needed for wheezing or shortness of breath.       Recent Visits  Date Type Provider Dept   01/19/20 Office Visit Keri Rosita Fire, FNP [facility]   11/11/19 Office Visit Keri Rosita Fire, FNP [facility]   Showing recent visits within past 365 days with a meds authorizing provider and meeting all other requirements  Future Appointments  No visits were found meeting these conditions.  Showing future appointments within next 365 days with a meds authorizing provider and meeting all other requirements       Labs:   A1c:   Hemoglobin A1C (%)   Date Value   05/27/2020 8.8 (A)   06/20/2014 10.0 (H)

## 2020-08-31 ENCOUNTER — Ambulatory Visit: Admit: 2020-08-31 | Payer: MEDICARE

## 2020-09-01 ENCOUNTER — Encounter: Payer: Self-pay | Admitting: *Deleted

## 2020-09-01 DIAGNOSIS — D869 Sarcoidosis, unspecified: Secondary | ICD-10-CM

## 2020-09-01 NOTE — Progress Notes (Signed)
Pulmonary Individual Treatment Plan  Patient Details  Name: FRANCI OSHANA MRN: [id number] Date of Birth: [birth year] Referring Provider:   Flowsheet Row Pulmonary Rehab from 04/13/2020 in [facility] Cardiac and Pulmonary Rehab  Referring Provider Vonita Moss MD       Initial Encounter Date:  Flowsheet Row Pulmonary Rehab from 04/13/2020 in [facility] Cardiac and Pulmonary Rehab  Date 04/13/20       Visit Diagnosis: Sarcoidosis  Patient's Home Medications on Admission:  Current Outpatient Medications:    Accu-Chek FastClix Lancets MISC, USE TO CHECK GLUCOSE 4 TIMES DAILY BEFORE MEAL(S) AND NIGHTLY, Disp: , Rfl:    acetaminophen (TYLENOL) 325 MG tablet, Take by mouth., Disp: , Rfl:    albuterol (PROVENTIL HFA;VENTOLIN HFA) 108 (90 BASE) MCG/ACT inhaler, Inhale 2 puffs into the lungs every 6 (six) hours as needed for wheezing or shortness of breath., Disp: , Rfl:    albuterol (PROVENTIL) (2.5 MG/3ML) 0.083% nebulizer solution, Inhale 2.5 mg into the lungs every 6 (six) hours as needed for wheezing or shortness of breath. , Disp: , Rfl:    albuterol (VENTOLIN HFA) 108 (90 Base) MCG/ACT inhaler, Inhale into the lungs. (Patient not taking: Reported on 03/30/2020), Disp: , Rfl:    amLODipine (NORVASC) 5 MG tablet, Take by mouth., Disp: , Rfl:    aspirin EC 325 MG tablet, Take 325 mg by mouth daily., Disp: , Rfl:    Blood Glucose Monitoring Suppl (ACCU-CHEK GUIDE) w/Device KIT, 1 Device by Other route Four (4) times a day (before meals and nightly). AS DIRECTED, Disp: , Rfl:    budesonide (PULMICORT) 0.5 MG/2ML nebulizer solution, Inhale 2 mLs (0.5 mg total) into the lungs every 12 (twelve) hours., Disp: 120 mL, Rfl: 0   budesonide-formoterol (SYMBICORT) 160-4.5 MCG/ACT inhaler, Inhale into the lungs., Disp: , Rfl:    cyclobenzaprine (FLEXERIL) 10 MG tablet, Take 10 mg by mouth 3 (three) times daily as needed for muscle spasms. , Disp: , Rfl:    feeding supplement, ENSURE ENLIVE, (ENSURE ENLIVE) LIQD,  Take 237 mLs by mouth daily. (Patient not taking: Reported on 03/30/2020), Disp: 237 mL, Rfl: 12   furosemide (LASIX) 20 MG tablet, Take by mouth., Disp: , Rfl:    gabapentin (NEURONTIN) 300 MG capsule, Take 300 mg by mouth 3 (three) times daily., Disp: , Rfl:    glucose blood (PRECISION QID TEST) test strip, by Other route Three (3) times a day. ACCU-CHEK Guide meter, Disp: , Rfl:    hydrochlorothiazide (HYDRODIURIL) 25 MG tablet, Take 25 mg by mouth daily., Disp: , Rfl:    insulin aspart (NOVOLOG) 100 UNIT/ML injection, INJECT 1 TO 20 UNIT(S) SUBCUTANEOUSLY 4 TIMES DAILY BEFORE MEALS AND AT BEDTIME AS DIRECTED, Disp: , Rfl:    Insulin Glargine (BASAGLAR KWIKPEN) 100 UNIT/ML, 18 Units nightly, Disp: , Rfl:    ipratropium (ATROVENT) 0.02 % nebulizer solution, 2 (two) times daily, Disp: , Rfl:    ketoconazole (NIZORAL) 2 % cream, Apply topically., Disp: , Rfl:    levofloxacin (LEVAQUIN) 750 MG tablet, Take 1 tablet (750 mg total) by mouth daily. (Patient not taking: Reported on 03/30/2020), Disp: 5 tablet, Rfl: 0   lisinopril (PRINIVIL,ZESTRIL) 10 MG tablet, Take 10 mg by mouth daily.  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[place])    COPD (chronic obstructive pulmonary disease) ([place])    Diabetes mellitus without complication ([place])    Hypertension    Sarcoidosis     Tobacco Use: Social History   Tobacco Use  Smoking Status Former   Packs/day: 0.25   Years: 6.00   Pack years:  1.50   Types: Cigarettes   Quit date: 03/21/1987   Years since quitting: 33.4  Smokeless Tobacco Never    Labs: Recent Review Flowsheet Data     Labs for ITP Cardiac and Pulmonary Rehab Latest Ref Rng & Units 12/24/2014 08/26/2016   Hemoglobin A1c 4.8 - 5.6 % - 12.3(H)   HCO3 21.0 - 28.0 mEq/L 29.2(H) -        Pulmonary Assessment Scores:  Pulmonary Assessment Scores     Row Name 04/13/20 1556         ADL UCSD   ADL Phase Entry     SOB Score total 79     Rest 3     Walk 4     Stairs 5     Bath 3     Dress 3     Shop 4           CAT Score     CAT Score 29           mMRC Score     mMRC Score 4             UCSD: Self-administered rating of dyspnea associated with activities of daily living (ADLs) 6-point scale (0 = "not at all" to 5 = "maximal or unable to do because of breathlessness")  Scoring Scores range from 0 to 120.  Minimally important difference is 5 units  CAT: CAT can identify the health impairment of COPD patients and is better correlated with disease progression.  CAT has a scoring range of zero to 40. The CAT score is classified into four groups of low (less than 10), medium (10 - 20), high (21-30) and very high (31-40) based on the impact level of disease on health status. A CAT score over 10 suggests significant symptoms.  A worsening CAT score could be explained by an exacerbation, poor medication adherence, poor inhaler technique, or progression of COPD or comorbid conditions.  CAT MCID is 2 points  mMRC: mMRC (Modified Medical Research Council) Dyspnea Scale is used to assess the degree of baseline functional disability in patients of respiratory disease due to dyspnea. No minimal important difference is established. A decrease in score of 1 point or greater is considered a positive change.   Pulmonary Function Assessment:  Pulmonary Function Assessment - 04/13/20 1556       Breath   Shortness of Breath Yes;Fear of Shortness of  Breath;Limiting activity;Panic with Shortness of Breath             Exercise Target Goals: Exercise Program Goal: Individual exercise prescription set using results from initial 6 min walk test and THRR while considering  patient's activity barriers and safety.   Exercise Prescription Goal: Initial exercise prescription builds to 30-45 minutes a day of aerobic activity, 2-3 days per week.  Home exercise  guidelines will be given to patient during program as part of exercise prescription that the participant will acknowledge.  Education: Aerobic Exercise: - Group verbal and visual presentation on the components of exercise prescription. Introduces F.I.T.T principle from ACSM for exercise prescriptions.  Reviews F.I.T.T. principles of aerobic exercise including progression. Written material given at graduation.   Education: Resistance Exercise: - Group verbal and visual presentation on the components of exercise prescription. Introduces F.I.T.T principle from ACSM for exercise prescriptions  Reviews F.I.T.T. principles of resistance exercise including progression. Written material given at graduation. Flowsheet Row Pulmonary Rehab from 08/25/2020 in [facility] Cardiac and Pulmonary Rehab  Date 05/12/20  Educator AS  Instruction Review Code 1- Verbalizes Understanding        Education: Exercise & Equipment Safety: - Individual verbal instruction and demonstration of equipment use and safety with use of the equipment. Flowsheet Row Pulmonary Rehab from 08/25/2020 in [facility] Cardiac and Pulmonary Rehab  Date 03/30/20  Educator [facility]  Instruction Review Code 1- Verbalizes Understanding       Education: Exercise Physiology & General Exercise Guidelines: - Group verbal and written instruction with models to review the exercise physiology of the cardiovascular system and associated critical values. Provides general exercise guidelines with specific guidelines to those with heart or lung disease.     Education: Flexibility, Balance, Mind/Body Relaxation: - Group verbal and visual presentation with interactive activity on the components of exercise prescription. Introduces F.I.T.T principle from ACSM for exercise prescriptions. Reviews F.I.T.T. principles of flexibility and balance exercise training including progression. Also discusses the mind body connection.  Reviews various relaxation techniques to help reduce and manage stress (i.e. Deep breathing, progressive muscle relaxation, and visualization). Balance handout provided to take home. Written material given at graduation.   Activity Barriers & Risk Stratification:  Activity Barriers & Cardiac Risk Stratification - 04/13/20 1550       Activity Barriers & Cardiac Risk Stratification   Activity Barriers Deconditioning;Muscular Weakness;Shortness of Breath;Assistive Device;Other (comment)    Comments uses scooter now due to SOB and fatigue             6 Minute Walk:  6 Minute Walk     Row Name 04/13/20 1545         6 Minute Walk   Phase Initial     Distance 270 feet     Walk Time 2 minutes     # of Rest Breaks 2  2:47, 1:23     MPH 1.53     METS 2.06     RPE 20     Perceived Dyspnea  4     VO2 Peak 7.2     Symptoms Yes (comment)     Comments SOB, fatigue, chest tightness from breathing     Resting HR 94 bpm  initially 77% on low tank     Resting BP 132/78     Resting Oxygen Saturation  99 %     Exercise Oxygen Saturation  during 6 min walk 84 %     Max Ex. HR 125 bpm     Max Ex. BP 152/84     2 Minute Post BP 132/76           Interval HR     1 Minute HR 112     2 Minute HR 123     3 Minute HR 118     4 Minute HR 121     5 Minute HR 122     6 Minute  HR 125     2 Minute Post HR 114     Interval Heart Rate? Yes           Interval Oxygen     Interval Oxygen? Yes     Baseline Oxygen Saturation % 99 %     1 Minute Oxygen Saturation % 98 %     1 Minute Liters of Oxygen 6 L  continuous e-tank     2  Minute Oxygen Saturation % 86 %  seated     2 Minute Liters of Oxygen 6 L     3 Minute Oxygen Saturation % 88 %  seated     3 Minute Liters of Oxygen 6 L     4 Minute Oxygen Saturation % 93 %     4 Minute Liters of Oxygen 6 L     5 Minute Oxygen Saturation % 90 %  seated     5 Minute Liters of Oxygen 6 L     6 Minute Oxygen Saturation % 87 %  low at 84% at 76mn     6 Minute Liters of Oxygen 6 L     2 Minute Post Oxygen Saturation % 91 %     2 Minute Post Liters of Oxygen 6 L            Oxygen Initial Assessment:  Oxygen Initial Assessment - 04/13/20 1555       Home Oxygen   Home Oxygen Device Home Concentrator;Portable Concentrator;E-Tanks    Sleep Oxygen Prescription Continuous    Liters per minute 5    Home Exercise Oxygen Prescription Continuous    Liters per minute 5    Home Resting Oxygen Prescription Continuous    Liters per minute 5    Compliance with Home Oxygen Use Yes      Initial 6 min Walk   Oxygen Used Continuous;E-Tanks    Liters per minute 6      Program Oxygen Prescription   Program Oxygen Prescription Continuous;E-Tanks    Liters per minute 6      Intervention   Short Term Goals To learn and exhibit compliance with exercise, home and travel O2 prescription;To learn and understand importance of monitoring SPO2 with pulse oximeter and demonstrate accurate use of the pulse oximeter.;To learn and understand importance of maintaining oxygen saturations>88%;To learn and demonstrate proper pursed lip breathing techniques or other breathing techniques. ;To learn and demonstrate proper use of respiratory medications    Long  Term Goals Exhibits compliance with exercise, home  and travel O2 prescription;Verbalizes importance of monitoring SPO2 with pulse oximeter and return demonstration;Maintenance of O2 saturations>88%;Exhibits proper breathing techniques, such as pursed lip breathing or other method taught during program session;Compliance with respiratory  medication;Demonstrates proper use of MDI's             Oxygen Re-Evaluation:  Oxygen Re-Evaluation     Row Name 04/29/20 1414 08/25/20 1417           Program Oxygen Prescription   Program Oxygen Prescription Continuous;E-Tanks Continuous;E-Tanks      Liters per minute 6 6             Home Oxygen      Home Oxygen Device -- Home Concentrator;E-Tanks;Portable Concentrator      Sleep Oxygen Prescription Continuous Continuous      Liters per minute 5 6      Home Exercise Oxygen Prescription Continuous Continuous      Liters per minute 5 6  Home Resting Oxygen Prescription Continuous Continuous      Liters per minute 5 4      Compliance with Home Oxygen Use Yes Yes             Goals/Expected Outcomes      Short Term Goals To learn and exhibit compliance with exercise, home and travel O2 prescription;To learn and understand importance of monitoring SPO2 with pulse oximeter and demonstrate accurate use of the pulse oximeter.;To learn and understand importance of maintaining oxygen saturations>88%;To learn and demonstrate proper pursed lip breathing techniques or other breathing techniques. ;To learn and demonstrate proper use of respiratory medications Other;To learn and exhibit compliance with exercise, home and travel O2 prescription      Long  Term Goals Exhibits compliance with exercise, home  and travel O2 prescription;Verbalizes importance of monitoring SPO2 with pulse oximeter and return demonstration;Maintenance of O2 saturations>88%;Exhibits proper breathing techniques, such as pursed lip breathing or other method taught during program session;Compliance with respiratory medication;Demonstrates proper use of MDI's Other;Exhibits compliance with exercise, home  and travel O2 prescription      Comments Reviewed PLB technique with pt.  Talked about how it works and it's importance in maintaining their exercise saturations. Kennyth Lose had her sleep study done and is going to find out  in a couple weeks if she needs a CPAP or BIPAP to sleep. Informed her to let staff know how her results are. If she does have sleep apnea, wearing a machine at night would help her condition.      Goals/Expected Outcomes Short: Become more profiecient at using PLB.   Long: Become independent at using PLB. Short: get results of sleep study. Long: wear home unit if ordered one.              Oxygen Discharge (Final Oxygen Re-Evaluation):  Oxygen Re-Evaluation - 08/25/20 1417       Program Oxygen Prescription   Program Oxygen Prescription Continuous;E-Tanks    Liters per minute 6      Home Oxygen   Home Oxygen Device Home Concentrator;E-Tanks;Portable Concentrator    Sleep Oxygen Prescription Continuous    Liters per minute 6    Home Exercise Oxygen Prescription Continuous    Liters per minute 6    Home Resting Oxygen Prescription Continuous    Liters per minute 4    Compliance with Home Oxygen Use Yes      Goals/Expected Outcomes   Short Term Goals Other;To learn and exhibit compliance with exercise, home and travel O2 prescription    Long  Term Goals Other;Exhibits compliance with exercise, home  and travel O2 prescription    Comments Kennyth Lose had her sleep study done and is going to find out in a couple weeks if she needs a CPAP or BIPAP to sleep. Informed her to let staff know how her results are. If she does have sleep apnea, wearing a machine at night would help her condition.    Goals/Expected Outcomes Short: get results of sleep study. Long: wear home unit if ordered one.             Initial Exercise Prescription:  Initial Exercise Prescription - 04/13/20 1500       Date of Initial Exercise RX and Referring Provider   Date 04/13/20    Referring Provider Vonita Moss MD      Oxygen   Oxygen Continuous    Liters 6      Recumbant Bike   Level 1  RPM 50    Watts 10    Minutes 15    METs 2      NuStep   Level 1    SPM 80    Minutes 15    METs 2      Arm  Ergometer   Level 1    RPM 30    Minutes 15    METs 2      Biostep-RELP   Level 1    SPM 50    Minutes 15    METs 2      Prescription Details   Frequency (times per week) 3    Duration Progress to 30 minutes of continuous aerobic without signs/symptoms of physical distress      Intensity   THRR 40-80% of Max Heartrate 122-150    Ratings of Perceived Exertion 11-13    Perceived Dyspnea 0-4      Progression   Progression Continue to progress workloads to maintain intensity without signs/symptoms of physical distress.      Resistance Training   Training Prescription Yes    Weight 3 lb    Reps 10-15             Perform Capillary Blood Glucose checks as needed.  Exercise Prescription Changes:   Exercise Prescription Changes     Row Name 04/13/20 1500 05/12/20 1000 08/05/20 1500 08/17/20 1300       Response to Exercise   Blood Pressure (Admit) 132/78 132/64 124/64 136/70    Blood Pressure (Exercise) 152/84 124/64 124/70 132/60    Blood Pressure (Exit) 112/60 102/64 122/60 122/76    Heart Rate (Admit) 94 bpm 100 bpm 110 bpm 100 bpm    Heart Rate (Exercise) 1255 bpm 108 bpm 104 bpm 97 bpm    Heart Rate (Exit) 104 bpm 93 bpm 96 bpm 92 bpm    Oxygen Saturation (Admit) 99 % 90 % 88 % 94 %    Oxygen Saturation (Exercise) 84 % 89 % 80 % 89 %    Oxygen Saturation (Exit) 97 % 95 % 88 % 99 %    Rating of Perceived Exertion (Exercise) _0 Perceived Dyspnea (Exercise) _1 Symptoms SOB, fatigue SOB, fatigue SOB SOB    Comments walk test results -- 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 Continue to progress workloads to maintain intensity without signs/symptoms of physical distress. Continue to progress workloads to maintain intensity without signs/symptoms of physical distress.    Average METs -- 2.35 2 1.25         Resistance Training        Training Prescription -- Yes Yes Yes    Weight -- 3 lb 2 lb 2 lb    Reps -- 10-15 10-15 10-15         Interval Training        Interval Training -- -- No No         Oxygen  Oxygen -- Continuous Continuous Continuous    Liters -- _0 NuStep        Level -- 1 3 --    Minutes -- 15 30 --    METs -- 2.7 2 --         Biostep-RELP        Level -- 1 -- 2    Minutes -- 15 -- 15    METs -- 2 -- 1.25         Home Exercise Plan        Plans to continue exercise at -- -- Home (comment)  bike/crank Home (comment)  bike/crank    Frequency -- -- Add 2 additional days to program exercise sessions. Add 2 additional days to program exercise sessions.    Initial Home Exercises Provided -- -- 05/13/20 05/13/20            Exercise Comments:   Exercise Goals and Review:   Exercise Goals     Row Name 04/13/20 1552             Exercise Goals   Increase Physical Activity Yes       Intervention Provide advice, education, support and counseling about physical activity/exercise needs.;Develop an individualized exercise prescription for aerobic and resistive training based on initial evaluation findings, risk stratification, comorbidities and participant's personal goals.       Expected Outcomes Short Term: Attend rehab on a regular basis to increase amount of physical activity.;Long Term: Add in home exercise to make exercise part of routine and to increase amount of physical activity.;Long Term: Exercising regularly at least 3-5 days a week.       Increase Strength and Stamina Yes       Intervention Provide advice, education, support and  counseling about physical activity/exercise needs.;Develop an individualized exercise prescription for aerobic and resistive training based on initial evaluation findings, risk stratification, comorbidities and participant's personal goals.       Expected Outcomes Short Term: Increase workloads from initial exercise prescription for resistance, speed, and METs.;Short Term: Perform resistance training exercises routinely during rehab and add in resistance training at home;Long Term: Improve cardiorespiratory fitness, muscular endurance and strength as measured by increased METs and functional capacity (6MWT)       Able to understand and use rate of perceived exertion (RPE) scale Yes       Intervention Provide education and explanation on how to use RPE scale       Expected Outcomes Short Term: Able to use RPE daily in rehab to express subjective intensity level;Long Term:  Able to use RPE to guide intensity level when exercising independently       Able to understand and use Dyspnea scale Yes       Intervention Provide education and explanation on how to use Dyspnea scale       Expected Outcomes Short Term: Able to use Dyspnea scale daily in rehab to express subjective sense of shortness of breath during exertion;Long Term: Able to use Dyspnea scale to guide intensity level when exercising independently       Knowledge and understanding of Target Heart Rate Range (THRR) Yes       Intervention Provide education and explanation of THRR including how the numbers were predicted and where they are located for reference       Expected Outcomes Short Term: Able to state/look up THRR;Long Term: Able to use THRR to govern  intensity when exercising independently;Short Term: Able to use daily as guideline for intensity in rehab       Able to check pulse independently Yes       Intervention Provide education and demonstration on how to check pulse in carotid and radial arteries.;Review the importance of being able to  check your own pulse for safety during independent exercise       Expected Outcomes Short Term: Able to explain why pulse checking is important during independent exercise;Long Term: Able to check pulse independently and accurately       Understanding of Exercise Prescription Yes       Intervention Provide education, explanation, and written materials on patient's individual exercise prescription       Expected Outcomes Short Term: Able to explain program exercise prescription;Long Term: Able to explain home exercise prescription to exercise independently                Exercise Goals Re-Evaluation :  Exercise Goals Re-Evaluation     [place] Name 04/29/20 1413 05/12/20 1042 05/13/20 1445 05/26/20 1414 06/08/20 1554     Exercise Goal Re-Evaluation   Exercise Goals Review Increase Physical Activity;Able to understand and use rate of perceived exertion (RPE) scale;Knowledge and understanding of Target Heart Rate Range (THRR);Understanding of Exercise Prescription;Increase Strength and Stamina;Able to understand and use Dyspnea scale;Able to check pulse independently Increase Physical Activity;Increase Strength and Stamina;Understanding of Exercise Prescription Increase Physical Activity;Increase Strength and Stamina -- --   Comments Reviewed RPE and dyspnea scales, THR and program prescription with pt today.  Pt voiced understanding and was given a copy of goals to take home. Kennyth Lose has completed her first two full days of exercise.  She has had some other things come up, including her sugars, that have interferred with her attendance.  We will contiue to monitor her progress and encourage improved attendance. Pt able to follow exercise prescription today without complaint.  Will continue to monitor for progression.  Reviewed home exercise with pt today.  Pt plans to use bike/arm machine at home for exercise.  Reviewed THR, pulse, RPE, sign and symptoms, pulse oximetery and when to call 911 or MD.  Also  discussed weather considerations and indoor options.  Pt voiced understanding. Called to check on Jackie.  She has been having a hard time with her feet being swollen and possibly around her lungs as well.  She has started some fluid medication and to help with swelling.  We talked about keeping feet elevated when not moving.  She has a follow up call tomorrow and possible appt on Friday.  We talked about returning to rehab next week to keep moving and fluid moving. Out since last review   Expected Outcomes Short: Use RPE daily to regulate intensity. Long: Follow program prescription in THR. Short: Improved reguarly attendance Long: Continue to follow program prescription. Short :  monitor HR and oxygen when exercising at home Long:  improve overall MET level -- --    Row Name 08/05/20 1510 08/17/20 1303           Exercise Goal Re-Evaluation   Exercise Goals Review Increase Physical Activity;Increase Strength and Stamina;Understanding of Exercise Prescription Increase Physical Activity;Increase Strength and Stamina      Comments Kennyth Lose returned on 5/18.  She desaturated fairly quickly and was encouraged to use PLB.  She stayed on the NuStep for the whole time. We will continue to monitor her progress. Kennyth Lose has only attended 3 times in May.  Consistent attendance is neceassry to see improvements.  Staff will review attendance policy.      Expected Outcomes Short: Improved attendance Long: Continue to improve stamina. Short: Improved attendance Long: Continue to improve stamina.               Discharge Exercise Prescription (Final Exercise Prescription Changes):  Exercise Prescription Changes - 08/17/20 1300       Response to Exercise   Blood Pressure (Admit) 136/70    Blood Pressure (Exercise) 132/60    Blood Pressure (Exit) 122/76    Heart Rate (Admit) 100 bpm    Heart Rate (Exercise) 97 bpm    Heart Rate (Exit) 92 bpm    Oxygen Saturation (Admit) 94 %    Oxygen Saturation (Exercise)  89 %    Oxygen Saturation (Exit) 99 %    Rating of Perceived Exertion (Exercise) 13    Perceived Dyspnea (Exercise) 2    Symptoms SOB    Duration Progress to 30 minutes of  aerobic without signs/symptoms of physical distress    Intensity THRR unchanged      Progression   Progression Continue to progress workloads to maintain intensity without signs/symptoms of physical distress.    Average METs 1.25      Resistance Training   Training Prescription Yes    Weight 2 lb    Reps 10-15      Interval Training   Interval Training No      Oxygen   Oxygen Continuous    Liters 6      Biostep-RELP   Level 2    Minutes 15    METs 1.25      Home Exercise Plan   Plans to continue exercise at Home (comment)   bike/crank   Frequency Add 2 additional days to program exercise sessions.    Initial Home Exercises Provided 05/13/20             Nutrition:  Target Goals: Understanding of nutrition guidelines, daily intake of sodium <1524m, cholesterol <2025m calories 30% from fat and 7% or less from saturated fats, daily to have 5 or more servings of fruits and vegetables.  Education: All About Nutrition: -Group instruction provided by verbal, written material, interactive activities, discussions, models, and posters to present general guidelines for heart healthy nutrition including fat, fiber, MyPlate, the role of sodium in heart healthy nutrition, utilization of the nutrition label, and utilization of this knowledge for meal planning. Follow up email sent as well. Written material given at graduation. [facility]  Education need identified 04/13/20  Date 07/28/20  Educator MCBon SecourInstruction Review Code 1- Verbalizes Understanding       Biometrics:  Pre Biometrics - 04/13/20 1553       Pre Biometrics   Height 5' 11.1" (1.806 m)    Weight 232 lb 4.8 oz (105.4 kg)    BMI (Calculated) 32.31    Single Leg Stand --    did not do due to SOB/fatigue             Nutrition Therapy Plan and Nutrition Goals:  Nutrition Therapy & Goals - 08/25/20 1534       Nutrition Therapy   RD appointment deferred Yes   Seeing outpatient RD - would not like to meet with this RD            Nutrition Assessments:  MEDIFICTS Score Key: ?70 Need to make dietary changes  40-70 Heart Healthy Diet ? 40 Therapeutic Level Cholesterol Diet  Flowsheet Row Pulmonary Rehab from 04/13/2020 in [facility] Card[facility] Pulmonary Rehab  Picture Your Plate Total Score on Admission 54      Picture Your Plate Scores: <58 Unhealthy dietary pattern with much room for improvement. 41-50 Dietary pattern unlikely to meet recommendations for good health and room for improvement. 51-60 More healthful dietary pattern, with some room for improvement.  >60 Healthy dietary pattern, although there may be some specific behaviors that could be improved.   Nutrition Goals Re-Evaluation:  Nutrition Goals Re-Evaluation     [place] Name 08/25/20 1421             Goals   Current Weight 222 lb (100.7 kg)       Nutrition Goal Talk to dietician at her doctors office.       Comment Kennyth Lose is not intersted in meeting with the dietician in the program but is meeting with one at her family doctors.       Expected Outcome Short: meet with dietician at her doctors. Long: maintain a heart healthy diet.                Nutrition Goals Discharge (Final Nutrition Goals Re-Evaluation):  Nutrition Goals Re-Evaluation - 08/25/20 1421       Goals   Current Weight 222 lb (100.7 kg)    Nutrition Goal Talk to dietician at her doctors office.    Comment Kennyth Lose is not intersted in meeting with the dietician in the program but is meeting with one at her family doctors.    Expected Outcome Short: meet with dietician at her doctors. Long: maintain a heart healthy diet.             Psychosocial: Target Goals: Acknowledge presence or absence of  significant depression and/or stress, maximize coping skills, provide positive support system. Participant is able to verbalize types and ability to use techniques and skills needed for reducing stress and depression.   Education: Stress, Anxiety, and Depression - Group verbal and visual presentation to define topics covered.  Reviews how body is impacted by stress, anxiety, and depression.  Also discusses healthy ways to reduce stress and to treat/manage anxiety and depression.  Written material given at graduation. Flowsheet Row Pulmonary Rehab from 08/25/2020 in [facility] Cardiac and Pulmonary Rehab  Date 08/25/20  Educator AS  Instruction Review Code 1- Verbalizes Understanding       Education: Sleep Hygiene -Provides group verbal and written instruction about how sleep can affect your health.  Define sleep hygiene, discuss sleep cycles and impact of sleep habits. Review good sleep hygiene tips.    Initial Review & Psychosocial Screening:  Initial Psych Review & Screening - 03/30/20 0945       Initial Review   Current issues with Current Stress Concerns    Source of Stress Concerns Chronic Illness      Family Dynamics   Good Support System? Yes    Comments Kennyth Lose can look to her parents and brother for support. She states that she has no stress concerns other than she cannot get around and is limited to doing daily activities.      Barriers   Psychosocial barriers to participate in program The patient should benefit from training in stress management and relaxation.      Screening Interventions   Interventions To provide support and resources with identified psychosocial needs;Provide feedback about the scores to participant;Encouraged to exercise    Expected  Outcomes Short Term goal: Utilizing psychosocial counselor, staff and physician to assist with identification of specific Stressors or current issues interfering with healing process. Setting desired goal for each stressor or  current issue identified.;Long Term Goal: Stressors or current issues are controlled or eliminated.;Short Term goal: Identification and review with participant of any Quality of Life or Depression concerns found by scoring the questionnaire.;Long Term goal: The participant improves quality of Life and PHQ9 Scores as seen by post scores and/or verbalization of changes             Quality of Life Scores:  Scores of 19 and below usually indicate a poorer quality of life in these areas.  A difference of  2-3 points is a clinically meaningful difference.  A difference of 2-3 points in the total score of the Quality of Life Index has been associated with significant improvement in overall quality of life, self-image, physical symptoms, and general health in studies assessing change in quality of life.  PHQ-9: Recent Review Flowsheet Data     Depression screen [facility] 2/9 08/25/2020 04/13/2020 03/06/2017   Decreased Interest 1 3 0   Down, Depressed, Hopeless 0 1 0   PHQ - 2 Score 1 4 0   Altered sleeping _0 Tired, decreased energy _1 Change in appetite 0 0 2   Feeling bad or failure about yourself  0 0 0   Trouble concentrating 0 0 0   Moving slowly or fidgety/restless 0 0 0   Suicidal thoughts 0 0 0   PHQ-9 Score _2 Difficult doing work/chores Somewhat difficult Very difficult Somewhat difficult      Interpretation of Total Score  Total Score Depression Severity:  1-4 = Minimal depression, 5-9 = Mild depression, 10-14 = Moderate depression, 15-19 = Moderately severe depression, 20-27 = Severe depression   Psychosocial Evaluation and Intervention:  Psychosocial Evaluation - 03/30/20 0947       Psychosocial Evaluation & Interventions   Interventions Stress management education;Relaxation education;Encouraged to exercise with the program and follow exercise prescription    Comments Kennyth Lose can look to her parents and brother for support. She states that she has no stress  concerns other than she cannot get around and is limited to doing daily activities.    Expected Outcomes Short: Exercise regularly to support mental health and notify staff of any changes. Long: maintain mental health and well being through teaching of rehab or prescribed medications independently.    Continue Psychosocial Services  Follow up required by staff             Psychosocial Re-Evaluation:  Psychosocial Re-Evaluation     [place] Name 08/25/20 1426             Psychosocial Re-Evaluation   Current issues with Current Sleep Concerns;Current Psychotropic Meds;History of Depression       Comments Reviewed patient health questionnaire (PHQ-9) with patient for follow up. Previously, patients score indicated signs/symptoms of depression.  Reviewed to see if patient is improving symptom wise while in program.  Score improved/declined and patient states that it is because she has been able to do more at home such as gardening and painting.       Expected Outcomes Short: Continue to attend [facility] regularly for regular exercise and social engagement. Long: Continue to improve symptoms and manage a positive mental state.       Interventions Encouraged to attend Pulmonary Rehabilitation for the exercise  Continue Psychosocial Services  Follow up required by staff                Psychosocial Discharge (Final Psychosocial Re-Evaluation):  Psychosocial Re-Evaluation - 08/25/20 1426       Psychosocial Re-Evaluation   Current issues with Current Sleep Concerns;Current Psychotropic Meds;History of Depression    Comments Reviewed patient health questionnaire (PHQ-9) with patient for follow up. Previously, patients score indicated signs/symptoms of depression.  Reviewed to see if patient is improving symptom wise while in program.  Score improved/declined and patient states that it is because she has been able to do more at home such as gardening and painting.    Expected Outcomes  Short: Continue to attend [facility] regularly for regular exercise and social engagement. Long: Continue to improve symptoms and manage a positive mental state.    Interventions Encouraged to attend Pulmonary Rehabilitation for the exercise    Continue Psychosocial Services  Follow up required by staff             Education: Education Goals: Education classes will be provided on a weekly basis, covering required topics. Participant will state understanding/return demonstration of topics presented.  Learning Barriers/Preferences:  Learning Barriers/Preferences - 03/30/20 0944       Learning Barriers/Preferences   Learning Barriers None    Learning Preferences None             General Pulmonary Education Topics:  Infection Prevention: - Provides verbal and written material to individual with discussion of infection control including proper hand washing and proper equipment cleaning during exercise session. Flowsheet Row Pulmonary Rehab from 08/25/2020 in [facility] Cardiac and Pulmonary Rehab  Date 03/30/20  Educator [facility]  Instruction Review Code 1- Verbalizes Understanding       Falls Prevention: - Provides verbal and written material to individual with discussion of falls prevention and safety. Flowsheet Row Pulmonary Rehab from 08/25/2020 in [facility] Cardiac and Pulmonary Rehab  Date 03/30/20  Educator [facility]  Instruction Review Code 1- Verbalizes Understanding       Chronic Lung Disease Review: - Group verbal instruction with posters, models, PowerPoint presentations and videos,  to review new updates, new respiratory medications, new advancements in procedures and treatments. Providing information on websites and "800" numbers for continued self-education. Includes information about supplement oxygen, available portable oxygen systems, continuous and intermittent flow rates, oxygen safety, concentrators, and Medicare reimbursement for oxygen. Explanation of Pulmonary Drugs, including  class, frequency, complications, importance of spacers, rinsing mouth after steroid MDI's, and proper cleaning methods for nebulizers. Review of basic lung anatomy and physiology related to function, structure, and complications of lung disease. Review of risk factors. Discussion about methods for diagnosing sleep apnea and types of masks and machines for OSA. Includes a review of the use of types of environmental controls: home humidity, furnaces, filters, dust mite/pet prevention, HEPA vacuums. Discussion about weather changes, air quality and the benefits of nasal washing. Instruction on Warning signs, infection symptoms, calling MD promptly, preventive modes, and value of vaccinations. Review of effective airway clearance, coughing and/or vibration techniques. Emphasizing that all should Create an Action Plan. Written material given at graduation. Flowsheet Row Pulmonary Rehab from 08/25/2020 in [facility] Cardiac and Pulmonary Rehab  Education need identified 04/13/20       AED/CPR: - Group verbal and written instruction with the use of models to demonstrate the basic use of the AED with the basic ABC's of resuscitation.    Anatomy and Cardiac Procedures: - Group verbal and visual  presentation and models provide information about basic cardiac anatomy and function. Reviews the testing methods done to diagnose heart disease and the outcomes of the test results. Describes the treatment choices: Medical Management, Angioplasty, or Coronary Bypass Surgery for treating various heart conditions including Myocardial Infarction, Angina, Valve Disease, and Cardiac Arrhythmias.  Written material given at graduation. Flowsheet Row Pulmonary Rehab from 08/25/2020 in [facility] Cardiac and Pulmonary Rehab  Date 05/12/20  Educator [facility]  Instruction Review Code 1- Verbalizes Understanding       Medication Safety: - Group verbal and visual instruction to review commonly prescribed medications for heart and lung disease.  Reviews the medication, class of the drug, and side effects. Includes the steps to properly store meds and maintain the prescription regimen.  Written material given at graduation.   Other: -Provides group and verbal instruction on various topics (see comments)   Knowledge Questionnaire Score:  Knowledge Questionnaire Score - 04/13/20 1554       Knowledge Questionnaire Score   Pre Score 16/18 Education Focus: Nutriton and O2 safety              Core Components/Risk Factors/Patient Goals at Admission:  Personal Goals and Risk Factors at Admission - 04/13/20 1554       Core Components/Risk Factors/Patient Goals on Admission    Weight Management Yes;Weight Loss;Obesity    Intervention Weight Management: Develop a combined nutrition and exercise program designed to reach desired caloric intake, while maintaining appropriate intake of nutrient and fiber, sodium and fats, and appropriate energy expenditure required for the weight goal.;Weight Management: Provide education and appropriate resources to help participant work on and attain dietary goals.;Weight Management/Obesity: Establish reasonable short term and long term weight goals.;Obesity: Provide education and appropriate resources to help participant work on and attain dietary goals.    Admit Weight 232 lb 4.8 oz (105.4 kg)    Goal Weight: Short Term 225 lb (102.1 kg)    Goal Weight: Long Term 220 lb (99.8 kg)    Expected Outcomes Short Term: Continue to assess and modify interventions until short term weight is achieved;Long Term: Adherence to nutrition and physical activity/exercise program aimed toward attainment of established weight goal;Weight Loss: Understanding of general recommendations for a balanced deficit meal plan, which promotes 1-2 lb weight loss per week and includes a negative energy balance of [phone] kcal/d;Understanding recommendations for meals to include 15-35% energy as protein, 25-35% energy from fat, 35-60%  energy from carbohydrates, less than 271m of dietary cholesterol, 20-35 gm of total fiber daily;Understanding of distribution of calorie intake throughout the day with the consumption of 4-5 meals/snacks    Improve shortness of breath with ADL's Yes    Intervention Provide education, individualized exercise plan and daily activity instruction to help decrease symptoms of SOB with activities of daily living.    Expected Outcomes Short Term: Improve cardiorespiratory fitness to achieve a reduction of symptoms when performing ADLs;Long Term: Be able to perform more ADLs without symptoms or delay the onset of symptoms    Diabetes Yes    Intervention Provide education about signs/symptoms and action to take for hypo/hyperglycemia.;Provide education about proper nutrition, including hydration, and aerobic/resistive exercise prescription along with prescribed medications to achieve blood glucose in normal ranges: Fasting glucose 65-99 mg/dL    Expected Outcomes Short Term: Participant verbalizes understanding of the signs/symptoms and immediate care of hyper/hypoglycemia, proper foot care and importance of medication, aerobic/resistive exercise and nutrition plan for blood glucose control.;Long Term: Attainment of HbA1C < 7%.  Hypertension Yes    Intervention Provide education on lifestyle modifcations including regular physical activity/exercise, weight management, moderate sodium restriction and increased consumption of fresh fruit, vegetables, and low fat dairy, alcohol moderation, and smoking cessation.;Monitor prescription use compliance.    Expected Outcomes Long Term: Maintenance of blood pressure at goal levels.;Short Term: Continued assessment and intervention until BP is < 140/60m HG in hypertensive participants. < 130/817mHG in hypertensive participants with diabetes, heart failure or chronic kidney disease.    Lipids Yes    Intervention Provide education and support for participant on nutrition  & aerobic/resistive exercise along with prescribed medications to achieve LDL <7048mHDL >85m38m  Expected Outcomes Short Term: Participant states understanding of desired cholesterol values and is compliant with medications prescribed. Participant is following exercise prescription and nutrition guidelines.;Long Term: Cholesterol controlled with medications as prescribed, with individualized exercise RX and with personalized nutrition plan. Value goals: LDL < 70mg67mL > 40 mg.             Education:Diabetes - Individual verbal and written instruction to review signs/symptoms of diabetes, desired ranges of glucose level fasting, after meals and with exercise. Acknowledge that pre and post exercise glucose checks will be done for 3 sessions at entry of program. Flowsheet Row [facility]monary [facility]  Date 04/13/20  Educator JH  [facility] Cent[facility]ruction Review Code 1- Verbalizes Understanding       Know Your Numbers and Heart Failure: - Group verbal and visual instruction to discuss disease risk factors for cardiac and pulmonary disease and treatment options.  Reviews associated critical values for Overweight/Obesity, Hypertension, Cholesterol, and Diabetes.  Discusses basics of heart failure: signs/symptoms and treatments.  Introduces Heart Failure Zone chart for action plan for heart failure.  Written material given at graduation.   Core Components/Risk Factors/Patient Goals Review:   Goals and Risk Factor Review     Row Name 08/25/20 1421             Core Components/Risk Factors/Patient Goals Review   Personal Goals Review Improve shortness of breath with ADL's       Review Spoke to patient about their shortness of breath and what they can do to improve. Patient has been informed of breathing techniques when starting the program. Patient is informed to tell staff if they have had any med changes and that certain meds they are taking or not taking can  be causing shortness of breath.       Expected Outcomes Short: Attend [facility] regularly to improve shortness of breath with ADL's. Long: maintain independence with ADL's                Core Components/Risk Factors/Patient Goals at Discharge (Final Review):   Goals and Risk Factor Review - 08/25/20 1421       Core Components/Risk Factors/Patient Goals Review   Personal Goals Review Improve shortness of breath with ADL's    Review Spoke to patient about their shortness of breath and what they can do to improve. Patient has been informed of breathing techniques when starting the program. Patient is informed to tell staff if they have had any med changes and that certain meds they are taking or not taking can be causing shortness of breath.    Expected Outcomes Short: Attend [facility] regularly to improve shortness of breath with ADL's. Long: maintain independence with ADL's             ITP Comments:  ITP  Comments     Row Name 03/30/20 0941 04/13/20 1544 04/14/20 0937 04/29/20 1411 05/12/20 0615   ITP Comments Virtual Visit completed. Patient informed on EP and RD appointment and 6 Minute walk test. Patient also informed of patient health questionnaires on My Chart. Patient Verbalizes understanding. Visit diagnosis can be found in [facility]10/29/2021. Completed 6MWT and gym orientation. Initial ITP created and sent for review to Dr. Emily Filbert, Medical Director. 30 Day review completed. Medical Director ITP review done, changes made as directed, and signed approval by Medical Director.   New to program First full day of exercise!  Patient was oriented to gym and equipment including functions, settings, policies, and procedures.  Patient's individual exercise prescription and treatment plan were reviewed.  All starting workloads were established based on the results of the 6 minute walk test done at initial orientation visit.  The plan for exercise progression was also introduced and progression  will be customized based on patient's performance and goals. 30 Day review completed. Medical Director ITP review done, changes made as directed, and signed approval by Medical Director.    [place] Name 05/26/20 1413 06/08/20 1553 06/09/20 0721 06/21/20 1600 07/06/20 0800   ITP Comments Called to check on [place].  She has been having a hard time with her feet being swollen and possibly around her lungs as well.  She has started some fluid medication and to help with swelling.  We talked about keeping feet elevated when not moving.  She has a follow up call tomorrow and possible appt on Friday.  We talked about returning to rehab next week to keep moving and fluid moving. Called to check on pt.  Out since 05/13/20.  Left message. 30 Day review completed. Medical Director ITP review done, changes made as directed, and signed approval by Medical Director. 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  Her f/u is scheduled for 06/24/20, will need clearance to return. Patient has not been to rehba since last review. - Toe amputated 3/30, f/u was sch for 4/7. Have not heard back regarding follow up or clearance.    [place] Name 07/07/20 6282 07/22/20 1518 08/03/20 1051 08/04/20 0631 09/01/20 0701   ITP Comments 30 Day review completed. Medical Director ITP review done, changes made as directed, and signed approval by Medical Director. Kennyth Lose has not attended since last review Kennyth Lose just came back to rehab 5/11; previously has not attended since February. She cancelled the RD call 5/16 and did not come to class 5/16 with no call. Unable to complete goals. 30 Day review completed. Medical Director ITP review done, changes made as directed, and signed approval by Medical Director. 30 Day review completed. Medical Director ITP review done, changes made as directed, and signed approval by Medical Director.            Comments:

## 2020-09-09 ENCOUNTER — Telehealth: Payer: Self-pay

## 2020-09-09 NOTE — Telephone Encounter (Signed)
LMOM

## 2020-09-14 NOTE — Unmapped (Signed)
[facility] Refill Coordination Note    Specialty Medication(s) to be Shipped:   CF/Pulmonary: -Nucala 100mg /ml  Other medication(s) to be shipped: No additional medications requested for fill at this time     Karen Barber, DOB: 1965/09/20  Phone: [phone] (home) [phone] (work)    All above HIPAA information was verified with patient.     Was a Nurse, learning disability used for this call? No    Completed refill call assessment today to schedule patient's medication shipment from the [facility] [phone]).  All relevant notes have been reviewed.     Specialty medication(s) and dose(s) confirmed: Regimen is correct and unchanged.   Changes to medications: [name] reports no changes at this time.  Changes to insurance: No  New side effects reported not previously addressed with a pharmacist or physician: None reported  Questions for the pharmacist: No    Confirmed patient received a Conservation officer, historic buildings and a Surveyor, mining with first shipment. The patient will receive a drug information handout for each medication shipped and additional FDA Medication Guides as required.       DISEASE/MEDICATION-SPECIFIC INFORMATION        For CF patients: CF [facility] Active? No-not enrolled    SPECIALTY MEDICATION ADHERENCE     Medication Adherence    Patient reported X missed doses in the last month: 0  Specialty Medication: Nucala 100mg /ml  Patient is on additional specialty medications: No  Patient is on more than two specialty medications: No  Informant: patient  Reasons for non-adherence: no problems identified  Confirmed plan for next specialty medication refill: delivery by pharmacy  Refills needed for supportive medications: not needed        Were doses missed due to medication being on hold? No    Nucala 100mg /ml: 0 days of medicine on hand (Next Injection: 09/14/20, but pt will take it a day late 09/15/20)    REFERRAL TO PHARMACIST     Referral to the pharmacist: Not needed    [facility]     Shipping address confirmed in Epic.     Delivery Scheduled: Yes, Expected medication delivery date: 09/15/20.     Medication will be delivered via Same Day Courier to the prescription address in Epic WAM.    Lille Karim P Allena Katz   [facility] Specialty Technician

## 2020-09-15 ENCOUNTER — Other Ambulatory Visit: Payer: Self-pay

## 2020-09-15 DIAGNOSIS — D869 Sarcoidosis, unspecified: Secondary | ICD-10-CM

## 2020-09-15 DIAGNOSIS — D860 Sarcoidosis of lung: Secondary | ICD-10-CM | POA: Diagnosis not present

## 2020-09-15 MED FILL — NUCALA 100 MG/ML SUBCUTANEOUS AUTO-INJECTOR: SUBCUTANEOUS | 28 days supply | Qty: 1 | Fill #3

## 2020-09-15 NOTE — Progress Notes (Signed)
Daily Session Note  Patient Details  Name: Charlotte Cross MRN: [id number] Date of Birth: 09/29/1965 Referring Provider:   Flowsheet Row Pulmonary Rehab from 04/13/2020 in [facility] Cardiac and Pulmonary Rehab  Referring Provider Vonita Moss MD       Encounter Date: 09/15/2020  Check In:  Session Check In - 09/15/20 1415       Check-In   Supervising physician immediately available to respond to emergencies See telemetry face sheet for immediately available ER MD    Location [facility]-Cardiac & Pulmonary Rehab    Staff Present Coralie Keens, MS, ASCM CEP, Exercise Physiologist;Kelly Rosalia Hammers, MPA, RN;Joseph Tessie Fass, Cristopher Estimable, RN BSN    Virtual Visit No    Medication changes reported     No    Fall or balance concerns reported    No    Warm-up and Cool-down Performed on first and last piece of equipment    Resistance Training Performed Yes    VAD Patient? No    PAD/SET Patient? No      Pain Assessment   Currently in Pain? No/denies                Social History   Tobacco Use  Smoking Status Former   Packs/day: 0.25   Years: 6.00   Pack years: 1.50   Types: Cigarettes   Quit date: 03/21/1987   Years since quitting: 33.5  Smokeless Tobacco Never    Goals Met:  Independence with exercise equipment Exercise tolerated well No report of cardiac concerns or symptoms Strength training completed today  Goals Unmet:  Not Applicable  Comments: Pt able to follow exercise prescription today without complaint.  Will continue to monitor for progression.    Dr. Emily Filbert is Medical Director for [place].  Dr. Ottie Glazier is Medical Director for [facility] Pulmonary Rehabilitation.

## 2020-09-16 ENCOUNTER — Encounter: Payer: Medicare Other | Admitting: *Deleted

## 2020-09-16 ENCOUNTER — Other Ambulatory Visit: Payer: Self-pay

## 2020-09-16 DIAGNOSIS — D860 Sarcoidosis of lung: Secondary | ICD-10-CM | POA: Diagnosis not present

## 2020-09-16 DIAGNOSIS — D869 Sarcoidosis, unspecified: Secondary | ICD-10-CM

## 2020-09-16 NOTE — Progress Notes (Signed)
Daily Session Note  Patient Details  Name: AHRIANNA SIGLIN MRN: [id number] Date of Birth: December 21, 1965 Referring Provider:   Flowsheet Row Pulmonary Rehab from 04/13/2020 in [facility] Cardiac and Pulmonary Rehab  Referring Provider Vonita Moss MD       Encounter Date: 09/16/2020  Check In:  Session Check In - 09/16/20 1403       Check-In   Supervising physician immediately available to respond to emergencies See telemetry face sheet for immediately available ER MD    Location [facility]-Cardiac & Pulmonary Rehab    Staff Present Renita Papa, RN BSN;Jessica Luan Pulling, MA, RCEP, CCRP, CCET;Joseph [place], [place]    Virtual Visit No    Medication changes reported     No    Warm-up and Cool-down Performed on first and last piece of equipment    Resistance Training Performed Yes    VAD Patient? No    PAD/SET Patient? No      Pain Assessment   Currently in Pain? No/denies                Social History   Tobacco Use  Smoking Status Former   Packs/day: 0.25   Years: 6.00   Pack years: 1.50   Types: Cigarettes   Quit date: 03/21/1987   Years since quitting: 33.5  Smokeless Tobacco Never    Goals Met:  Independence with exercise equipment Exercise tolerated well No report of cardiac concerns or symptoms Strength training completed today  Goals Unmet:  Not Applicable  Comments: Pt able to follow exercise prescription today without complaint.  Will continue to monitor for progression.    Dr. Emily Filbert is Medical Director for [place].  Dr. Ottie Glazier is Medical Director for [facility] Pulmonary Rehabilitation.

## 2020-09-22 ENCOUNTER — Encounter: Payer: Medicare Other | Attending: Critical Care Medicine

## 2020-09-22 ENCOUNTER — Other Ambulatory Visit: Payer: Self-pay

## 2020-09-22 DIAGNOSIS — D869 Sarcoidosis, unspecified: Secondary | ICD-10-CM

## 2020-09-22 DIAGNOSIS — D860 Sarcoidosis of lung: Secondary | ICD-10-CM | POA: Diagnosis present

## 2020-09-22 DIAGNOSIS — J9611 Chronic respiratory failure with hypoxia: Secondary | ICD-10-CM | POA: Insufficient documentation

## 2020-09-22 NOTE — Progress Notes (Signed)
Daily Session Note  Patient Details  Name: Charlotte Cross MRN: [id number] Date of Birth: [birth year] Referring Provider:   Flowsheet Row Pulmonary Rehab from 04/13/2020 in [facility] Cardiac and Pulmonary Rehab  Referring Provider Vonita Moss MD       Encounter Date: 09/22/2020  Check In:  Session Check In - 09/22/20 1425       Check-In   Supervising physician immediately available to respond to emergencies See telemetry face sheet for immediately available ER MD    Location [facility]-Cardiac & Pulmonary Rehab    Staff Present Birdie Sons, MPA, RN;Joseph Tessie Fass, Sharren Bridge, MS, ASCM CEP, Exercise Physiologist;Meredith Sherryll Burger, RN BSN;Jessica Luan Pulling, MA, RCEP, CCRP, CCET    Virtual Visit No    Medication changes reported     No    Fall or balance concerns reported    No    Warm-up and Cool-down Performed on first and last piece of equipment    Resistance Training Performed Yes    VAD Patient? No    PAD/SET Patient? No      Pain Assessment   Currently in Pain? No/denies                Social History   Tobacco Use  Smoking Status Former   Packs/day: 0.25   Years: 6.00   Pack years: 1.50   Types: Cigarettes   Quit date: 03/21/1987   Years since quitting: 33.5  Smokeless Tobacco Never    Goals Met:  Independence with exercise equipment Exercise tolerated well No report of cardiac concerns or symptoms Strength training completed today  Goals Unmet:  Not Applicable  Comments: Pt able to follow exercise prescription today without complaint.  Will continue to monitor for progression.    Dr. Emily Filbert is Medical Director for [place].  Dr. Ottie Glazier is Medical Director for [facility] Pulmonary Rehabilitation.

## 2020-09-23 ENCOUNTER — Other Ambulatory Visit: Payer: Self-pay

## 2020-09-23 ENCOUNTER — Encounter: Payer: Medicare Other | Admitting: *Deleted

## 2020-09-23 DIAGNOSIS — D860 Sarcoidosis of lung: Secondary | ICD-10-CM | POA: Diagnosis not present

## 2020-09-23 DIAGNOSIS — D869 Sarcoidosis, unspecified: Secondary | ICD-10-CM

## 2020-09-23 NOTE — Progress Notes (Signed)
Daily Session Note  Patient Details  Name: Charlotte Cross MRN: [id number] Date of Birth: March 23, 1965 Referring Provider:   Flowsheet Row Pulmonary Rehab from 04/13/2020 in [facility] Cardiac and Pulmonary Rehab  Referring Provider Vonita Moss MD       Encounter Date: 09/23/2020  Check In:  Session Check In - 09/23/20 1357       Check-In   Supervising physician immediately available to respond to emergencies See telemetry face sheet for immediately available ER MD    Location [facility]-Cardiac & Pulmonary Rehab    Staff Present Renita Papa, RN BSN;Melissa Lorraine, RDN, Tawanna Solo, MS, ASCM CEP, Exercise Physiologist    Virtual Visit No    Medication changes reported     No    Fall or balance concerns reported    No    Warm-up and Cool-down Performed on first and last piece of equipment    Resistance Training Performed Yes    VAD Patient? No    PAD/SET Patient? No      Pain Assessment   Currently in Pain? No/denies                Social History   Tobacco Use  Smoking Status Former   Packs/day: 0.25   Years: 6.00   Pack years: 1.50   Types: Cigarettes   Quit date: 03/21/1987   Years since quitting: 33.5  Smokeless Tobacco Never    Goals Met:  Independence with exercise equipment Exercise tolerated well No report of cardiac concerns or symptoms Strength training completed today  Goals Unmet:  Not Applicable  Comments: Pt able to follow exercise prescription today without complaint.  Will continue to monitor for progression.    Dr. Emily Filbert is Medical Director for [place].  Dr. Ottie Glazier is Medical Director for [facility] Pulmonary Rehabilitation.

## 2020-09-29 ENCOUNTER — Other Ambulatory Visit: Payer: Self-pay

## 2020-09-29 ENCOUNTER — Encounter: Payer: Self-pay | Admitting: *Deleted

## 2020-09-29 ENCOUNTER — Encounter: Payer: Medicare Other | Admitting: *Deleted

## 2020-09-29 DIAGNOSIS — D860 Sarcoidosis of lung: Secondary | ICD-10-CM | POA: Diagnosis not present

## 2020-09-29 DIAGNOSIS — D869 Sarcoidosis, unspecified: Secondary | ICD-10-CM

## 2020-09-29 NOTE — Progress Notes (Signed)
Pulmonary Individual Treatment Plan  Patient Details  Name: Charlotte Cross MRN: [id number] Date of Birth: [birth year] Referring Provider:   Flowsheet Row Pulmonary Rehab from 04/13/2020 in [facility] Cardiac and Pulmonary Rehab  Referring Provider Vonita Moss MD       Initial Encounter Date:  Flowsheet Row Pulmonary Rehab from 04/13/2020 in [facility] Cardiac and Pulmonary Rehab  Date 04/13/20       Visit Diagnosis: Sarcoidosis  Patient's Home Medications on Admission:  Current Outpatient Medications:    Accu-Chek FastClix Lancets MISC, USE TO CHECK GLUCOSE 4 TIMES DAILY BEFORE MEAL(S) AND NIGHTLY, Disp: , Rfl:    acetaminophen (TYLENOL) 325 MG tablet, Take by mouth., Disp: , Rfl:    albuterol (PROVENTIL HFA;VENTOLIN HFA) 108 (90 BASE) MCG/ACT inhaler, Inhale 2 puffs into the lungs every 6 (six) hours as needed for wheezing or shortness of breath., Disp: , Rfl:    albuterol (PROVENTIL) (2.5 MG/3ML) 0.083% nebulizer solution, Inhale 2.5 mg into the lungs every 6 (six) hours as needed for wheezing or shortness of breath. , Disp: , Rfl:    albuterol (VENTOLIN HFA) 108 (90 Base) MCG/ACT inhaler, Inhale into the lungs. (Patient not taking: Reported on 03/30/2020), Disp: , Rfl:    amLODipine (NORVASC) 5 MG tablet, Take by mouth., Disp: , Rfl:    aspirin EC 325 MG tablet, Take 325 mg by mouth daily., Disp: , Rfl:    Blood Glucose Monitoring Suppl (ACCU-CHEK GUIDE) w/Device KIT, 1 Device by Other route Four (4) times a day (before meals and nightly). AS DIRECTED, Disp: , Rfl:    budesonide (PULMICORT) 0.5 MG/2ML nebulizer solution, Inhale 2 mLs (0.5 mg total) into the lungs every 12 (twelve) hours., Disp: 120 mL, Rfl: 0   budesonide-formoterol (SYMBICORT) 160-4.5 MCG/ACT inhaler, Inhale into the lungs., Disp: , Rfl:    cyclobenzaprine (FLEXERIL) 10 MG tablet, Take 10 mg by mouth 3 (three) times daily as needed for muscle spasms. , Disp: , Rfl:    feeding supplement, ENSURE ENLIVE, (ENSURE ENLIVE) LIQD,  Take 237 mLs by mouth daily. (Patient not taking: Reported on 03/30/2020), Disp: 237 mL, Rfl: 12   furosemide (LASIX) 20 MG tablet, Take by mouth., Disp: , Rfl:    gabapentin (NEURONTIN) 300 MG capsule, Take 300 mg by mouth 3 (three) times daily., Disp: , Rfl:    glucose blood (PRECISION QID TEST) test strip, by Other route Three (3) times a day. ACCU-CHEK Guide meter, Disp: , Rfl:    hydrochlorothiazide (HYDRODIURIL) 25 MG tablet, Take 25 mg by mouth daily., Disp: , Rfl:    insulin aspart (NOVOLOG) 100 UNIT/ML injection, INJECT 1 TO 20 UNIT(S) SUBCUTANEOUSLY 4 TIMES DAILY BEFORE MEALS AND AT BEDTIME AS DIRECTED, Disp: , Rfl:    Insulin Glargine (BASAGLAR KWIKPEN) 100 UNIT/ML, 18 Units nightly, Disp: , Rfl:    ipratropium (ATROVENT) 0.02 % nebulizer solution, 2 (two) times daily, Disp: , Rfl:    ketoconazole (NIZORAL) 2 % cream, Apply topically., Disp: , Rfl:    levofloxacin (LEVAQUIN) 750 MG tablet, Take 1 tablet (750 mg total) by mouth daily. (Patient not taking: Reported on 03/30/2020), Disp: 5 tablet, Rfl: 0   lisinopril (PRINIVIL,ZESTRIL) 10 MG tablet, Take 10 mg by mouth daily.  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[place])    COPD (chronic obstructive pulmonary disease) ([place])    Diabetes mellitus without complication ([place])    Hypertension    Sarcoidosis     Tobacco Use: Social History   Tobacco Use  Smoking Status Former   Packs/day: 0.25   Years: 6.00   Pack years:  1.50   Types: Cigarettes   Quit date: 03/21/1987   Years since quitting: 33.5  Smokeless Tobacco Never    Labs: Recent Review Flowsheet Data     Labs for ITP Cardiac and Pulmonary Rehab Latest Ref Rng & Units 12/24/2014 08/26/2016   Hemoglobin A1c 4.8 - 5.6 % - 12.3(H)   HCO3 21.0 - 28.0 mEq/L 29.2(H) -        Pulmonary Assessment Scores:  Pulmonary Assessment Scores     Row Name 04/13/20 1556         ADL UCSD   ADL Phase Entry     SOB Score total 79     Rest 3     Walk 4     Stairs 5     Bath 3     Dress 3     Shop 4           CAT Score     CAT Score 29           mMRC Score     mMRC Score 4             UCSD: Self-administered rating of dyspnea associated with activities of daily living (ADLs) 6-point scale (0 = "not at all" to 5 = "maximal or unable to do because of breathlessness")  Scoring Scores range from 0 to 120.  Minimally important difference is 5 units  CAT: CAT can identify the health impairment of COPD patients and is better correlated with disease progression.  CAT has a scoring range of zero to 40. The CAT score is classified into four groups of low (less than 10), medium (10 - 20), high (21-30) and very high (31-40) based on the impact level of disease on health status. A CAT score over 10 suggests significant symptoms.  A worsening CAT score could be explained by an exacerbation, poor medication adherence, poor inhaler technique, or progression of COPD or comorbid conditions.  CAT MCID is 2 points  mMRC: mMRC (Modified Medical Research Council) Dyspnea Scale is used to assess the degree of baseline functional disability in patients of respiratory disease due to dyspnea. No minimal important difference is established. A decrease in score of 1 point or greater is considered a positive change.   Pulmonary Function Assessment:  Pulmonary Function Assessment - 04/13/20 1556       Breath   Shortness of Breath Yes;Fear of Shortness of  Breath;Limiting activity;Panic with Shortness of Breath             Exercise Target Goals: Exercise Program Goal: Individual exercise prescription set using results from initial 6 min walk test and THRR while considering  patient's activity barriers and safety.   Exercise Prescription Goal: Initial exercise prescription builds to 30-45 minutes a day of aerobic activity, 2-3 days per week.  Home exercise  guidelines will be given to patient during program as part of exercise prescription that the participant will acknowledge.  Education: Aerobic Exercise: - Group verbal and visual presentation on the components of exercise prescription. Introduces F.I.T.T principle from ACSM for exercise prescriptions.  Reviews F.I.T.T. principles of aerobic exercise including progression. Written material given at graduation.   Education: Resistance Exercise: - Group verbal and visual presentation on the components of exercise prescription. Introduces F.I.T.T principle from ACSM for exercise prescriptions  Reviews F.I.T.T. principles of resistance exercise including progression. Written material given at graduation. Flowsheet Row Pulmonary Rehab from 08/25/2020 in [facility] Cardiac and Pulmonary Rehab  Date 05/12/20  Educator AS  Instruction Review Code 1- Verbalizes Understanding        Education: Exercise & Equipment Safety: - Individual verbal instruction and demonstration of equipment use and safety with use of the equipment. Flowsheet Row Pulmonary Rehab from 08/25/2020 in [facility] Cardiac and Pulmonary Rehab  Date 03/30/20  Educator [facility]  Instruction Review Code 1- Verbalizes Understanding       Education: Exercise Physiology & General Exercise Guidelines: - Group verbal and written instruction with models to review the exercise physiology of the cardiovascular system and associated critical values. Provides general exercise guidelines with specific guidelines to those with heart or lung disease.     Education: Flexibility, Balance, Mind/Body Relaxation: - Group verbal and visual presentation with interactive activity on the components of exercise prescription. Introduces F.I.T.T principle from ACSM for exercise prescriptions. Reviews F.I.T.T. principles of flexibility and balance exercise training including progression. Also discusses the mind body connection.  Reviews various relaxation techniques to help reduce and manage stress (i.e. Deep breathing, progressive muscle relaxation, and visualization). Balance handout provided to take home. Written material given at graduation.   Activity Barriers & Risk Stratification:  Activity Barriers & Cardiac Risk Stratification - 04/13/20 1550       Activity Barriers & Cardiac Risk Stratification   Activity Barriers Deconditioning;Muscular Weakness;Shortness of Breath;Assistive Device;Other (comment)    Comments uses scooter now due to SOB and fatigue             6 Minute Walk:  6 Minute Walk     Row Name 04/13/20 1545         6 Minute Walk   Phase Initial     Distance 270 feet     Walk Time 2 minutes     # of Rest Breaks 2  2:47, 1:23     MPH 1.53     METS 2.06     RPE 20     Perceived Dyspnea  4     VO2 Peak 7.2     Symptoms Yes (comment)     Comments SOB, fatigue, chest tightness from breathing     Resting HR 94 bpm  initially 77% on low tank     Resting BP 132/78     Resting Oxygen Saturation  99 %     Exercise Oxygen Saturation  during 6 min walk 84 %     Max Ex. HR 125 bpm     Max Ex. BP 152/84     2 Minute Post BP 132/76           Interval HR     1 Minute HR 112     2 Minute HR 123     3 Minute HR 118     4 Minute HR 121     5 Minute HR 122     6 Minute  HR 125     2 Minute Post HR 114     Interval Heart Rate? Yes           Interval Oxygen     Interval Oxygen? Yes     Baseline Oxygen Saturation % 99 %     1 Minute Oxygen Saturation % 98 %     1 Minute Liters of Oxygen 6 L  continuous e-tank     2  Minute Oxygen Saturation % 86 %  seated     2 Minute Liters of Oxygen 6 L     3 Minute Oxygen Saturation % 88 %  seated     3 Minute Liters of Oxygen 6 L     4 Minute Oxygen Saturation % 93 %     4 Minute Liters of Oxygen 6 L     5 Minute Oxygen Saturation % 90 %  seated     5 Minute Liters of Oxygen 6 L     6 Minute Oxygen Saturation % 87 %  low at 84% at 59mn     6 Minute Liters of Oxygen 6 L     2 Minute Post Oxygen Saturation % 91 %     2 Minute Post Liters of Oxygen 6 L            Oxygen Initial Assessment:  Oxygen Initial Assessment - 04/13/20 1555       Home Oxygen   Home Oxygen Device Home Concentrator;Portable Concentrator;E-Tanks    Sleep Oxygen Prescription Continuous    Liters per minute 5    Home Exercise Oxygen Prescription Continuous    Liters per minute 5    Home Resting Oxygen Prescription Continuous    Liters per minute 5    Compliance with Home Oxygen Use Yes      Initial 6 min Walk   Oxygen Used Continuous;E-Tanks    Liters per minute 6      Program Oxygen Prescription   Program Oxygen Prescription Continuous;E-Tanks    Liters per minute 6      Intervention   Short Term Goals To learn and exhibit compliance with exercise, home and travel O2 prescription;To learn and understand importance of monitoring SPO2 with pulse oximeter and demonstrate accurate use of the pulse oximeter.;To learn and understand importance of maintaining oxygen saturations>88%;To learn and demonstrate proper pursed lip breathing techniques or other breathing techniques. ;To learn and demonstrate proper use of respiratory medications    Long  Term Goals Exhibits compliance with exercise, home  and travel O2 prescription;Verbalizes importance of monitoring SPO2 with pulse oximeter and return demonstration;Maintenance of O2 saturations>88%;Exhibits proper breathing techniques, such as pursed lip breathing or other method taught during program session;Compliance with respiratory  medication;Demonstrates proper use of MDI's             Oxygen Re-Evaluation:  Oxygen Re-Evaluation     Row Name 04/29/20 1414 08/25/20 1417 09/15/20 1431         Program Oxygen Prescription   Program Oxygen Prescription Continuous;E-Tanks Continuous;E-Tanks Continuous;E-Tanks     Liters per minute _0 Home Oxygen       Home Oxygen Device -- Home Concentrator;E-Tanks;Portable Concentrator Home Concentrator;E-Tanks;Portable Concentrator     Sleep Oxygen Prescription Continuous Continuous Continuous     Liters per minute _1 Home Exercise Oxygen Prescription Continuous Continuous Continuous     Liters per minute 5  6 5     Home Resting Oxygen Prescription Continuous Continuous Continuous     Liters per minute _0 Compliance with Home Oxygen Use Yes Yes Yes           Goals/Expected Outcomes       Short Term Goals To learn and exhibit compliance with exercise, home and travel O2 prescription;To learn and understand importance of monitoring SPO2 with pulse oximeter and demonstrate accurate use of the pulse oximeter.;To learn and understand importance of maintaining oxygen saturations>88%;To learn and demonstrate proper pursed lip breathing techniques or other breathing techniques. ;To learn and demonstrate proper use of respiratory medications Other;To learn and exhibit compliance with exercise, home and travel O2 prescription To learn and exhibit compliance with exercise, home and travel O2 prescription;To learn and understand importance of maintaining oxygen saturations>88%;To learn and understand importance of monitoring SPO2 with pulse oximeter and demonstrate accurate use of the pulse oximeter.;To learn and demonstrate proper pursed lip breathing techniques or other breathing techniques. ;To learn and demonstrate proper use of respiratory medications     Long  Term Goals Exhibits compliance with exercise, home  and travel O2 prescription;Verbalizes importance  of monitoring SPO2 with pulse oximeter and return demonstration;Maintenance of O2 saturations>88%;Exhibits proper breathing techniques, such as pursed lip breathing or other method taught during program session;Compliance with respiratory medication;Demonstrates proper use of MDI's Other;Exhibits compliance with exercise, home  and travel O2 prescription Exhibits compliance with exercise, home  and travel O2 prescription;Verbalizes importance of monitoring SPO2 with pulse oximeter and return demonstration;Maintenance of O2 saturations>88%;Exhibits proper breathing techniques, such as pursed lip breathing or other method taught during program session;Compliance with respiratory medication;Demonstrates proper use of MDI's     Comments Reviewed PLB technique with pt.  Talked about how it works and it's importance in maintaining their exercise saturations. Charlotte Cross had her sleep study done and is going to find out in a couple weeks if she needs a CPAP or BIPAP to sleep. Informed her to let staff know how her results are. If she does have sleep apnea, wearing a machine at night would help her condition. She has noted that she is trying to get back to 5L on her pulsed tank.  She said that when she checks her sats at home they will drop on the pulsed flow.  She is a mouth breather so she is not getting in enough oxygen on pulsed flow.  We talked about focusing in on pursed lip breathing and using her nose to breathe especially with the oxygen cannual.     Goals/Expected Outcomes Short: Become more profiecient at using PLB.   Long: Become independent at using PLB. Short: get results of sleep study. Long: wear home unit if ordered one. Short: Continue to work on breathing Long: Continue to use PLB.             Oxygen Discharge (Final Oxygen Re-Evaluation):  Oxygen Re-Evaluation - 09/15/20 1431       Program Oxygen Prescription   Program Oxygen Prescription Continuous;E-Tanks    Liters per minute 6      Home  Oxygen   Home Oxygen Device Home Concentrator;E-Tanks;Portable Concentrator    Sleep Oxygen Prescription Continuous    Liters per minute 6    Home Exercise Oxygen Prescription Continuous    Liters per minute 5    Home Resting Oxygen Prescription Continuous    Liters per minute 4    Compliance with Home Oxygen Use Yes  Goals/Expected Outcomes   Short Term Goals To learn and exhibit compliance with exercise, home and travel O2 prescription;To learn and understand importance of maintaining oxygen saturations>88%;To learn and understand importance of monitoring SPO2 with pulse oximeter and demonstrate accurate use of the pulse oximeter.;To learn and demonstrate proper pursed lip breathing techniques or other breathing techniques. ;To learn and demonstrate proper use of respiratory medications    Long  Term Goals Exhibits compliance with exercise, home  and travel O2 prescription;Verbalizes importance of monitoring SPO2 with pulse oximeter and return demonstration;Maintenance of O2 saturations>88%;Exhibits proper breathing techniques, such as pursed lip breathing or other method taught during program session;Compliance with respiratory medication;Demonstrates proper use of MDI's    Comments She has noted that she is trying to get back to 5L on her pulsed tank.  She said that when she checks her sats at home they will drop on the pulsed flow.  She is a mouth breather so she is not getting in enough oxygen on pulsed flow.  We talked about focusing in on pursed lip breathing and using her nose to breathe especially with the oxygen cannual.    Goals/Expected Outcomes Short: Continue to work on breathing Long: Continue to use PLB.             Initial Exercise Prescription:  Initial Exercise Prescription - 04/13/20 1500       Date of Initial Exercise RX and Referring Provider   Date 04/13/20    Referring Provider Vonita Moss MD      Oxygen   Oxygen Continuous    Liters 6      Recumbant  Bike   Level 1    RPM 50    Watts 10    Minutes 15    METs 2      NuStep   Level 1    SPM 80    Minutes 15    METs 2      Arm Ergometer   Level 1    RPM 30    Minutes 15    METs 2      Biostep-RELP   Level 1    SPM 50    Minutes 15    METs 2      Prescription Details   Frequency (times per week) 3    Duration Progress to 30 minutes of continuous aerobic without signs/symptoms of physical distress      Intensity   THRR 40-80% of Max Heartrate 122-150    Ratings of Perceived Exertion 11-13    Perceived Dyspnea 0-4      Progression   Progression Continue to progress workloads to maintain intensity without signs/symptoms of physical distress.      Resistance Training   Training Prescription Yes    Weight 3 lb    Reps 10-15             Perform Capillary Blood Glucose checks as needed.  Exercise Prescription Changes:   Exercise Prescription Changes     Row Name 04/13/20 1500 05/12/20 1000 08/05/20 1500 08/17/20 1300 09/01/20 1300     Response to Exercise   Blood Pressure (Admit) 132/78 132/64 124/64 136/70 142/70   Blood Pressure (Exercise) 152/84 124/64 124/70 132/60 128/78   Blood Pressure (Exit) 112/60 102/64 122/60 122/76 102/70   Heart Rate (Admit) 94 bpm 100 bpm 110 bpm 100 bpm 89 bpm   Heart Rate (Exercise) 1255 bpm 108 bpm 104 bpm 97 bpm 100 bpm   Heart Rate (Exit) 104  bpm 93 bpm 96 bpm 92 bpm 88 bpm   Oxygen Saturation (Admit) 99 % 90 % 88 % 94 % 91 %   Oxygen Saturation (Exercise) 84 % 89 % 80 % 89 % 85 %   Oxygen Saturation (Exit) 97 % 95 % 88 % 99 % 97 %   Rating of Perceived Exertion (Exercise) _0 Perceived Dyspnea (Exercise) _1 Symptoms SOB, fatigue SOB, fatigue SOB SOB SOB   Comments walk test results -- 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 Continue to progress workloads to maintain intensity without signs/symptoms of physical distress. Continue to progress workloads to maintain intensity without signs/symptoms of physical distress. Continue to progress workloads to maintain intensity without signs/symptoms of physical distress.   Average METs -- 2.35 2 1.25 2.5     Resistance Training   Training Prescription -- Yes Yes Yes Yes   Weight -- 3 lb 2 lb 2 lb 2 lb   Reps -- 10-15 10-15 10-15 10-15     Interval Training   Interval Training -- -- No No No     Oxygen   Oxygen -- Continuous Continuous Continuous Continuous   Liters -- _2 NuStep   Level -- 1 3 -- --   Minutes -- 15 30 -- --   METs -- 2.7 2 -- --     Biostep-RELP   Level -- 1 -- 2 3   Minutes -- 15 -- 15 15   METs -- 2 -- 1.25 2.5     Home Exercise Plan   Plans to continue exercise at -- -- Home (comment)  bike/crank Home (comment)  bike/crank Home (comment)  bike/crank   Frequency -- -- Add 2 additional days to program exercise sessions. Add 2 additional days to program exercise sessions. Add 2 additional days to program exercise sessions.   Initial Home Exercises Provided -- -- 05/13/20 05/13/20 05/13/20    Row Name 09/16/20 1400 09/28/20 1600           Response to Exercise   Blood Pressure (Admit) 130/70 112/68      Blood Pressure (Exercise) 138/60 --      Blood Pressure (Exit) 106/64 114/72      Heart Rate (Admit) 90 bpm 86 bpm      Heart Rate (Exercise) 89 bpm 93 bpm      Heart Rate (Exit) 83 bpm 88 bpm      Oxygen Saturation (Admit) 87 % 93 %      Oxygen Saturation (Exercise) 87 % 87 %       Oxygen Saturation (Exit) 98 % 94 %      Rating of Perceived Exertion (Exercise) 13 11      Perceived Dyspnea (Exercise) 2 0      Symptoms SOB none      Duration Progress to 30 minutes of  aerobic without signs/symptoms of physical distress Continue with 30  min of aerobic exercise without signs/symptoms of physical distress.      Intensity THRR unchanged THRR unchanged             Progression      Progression Continue to progress workloads to maintain intensity without signs/symptoms of physical distress. Continue to progress workloads to maintain intensity without signs/symptoms of physical distress.      Average METs 2 2.4             Resistance Training      Training Prescription Yes Yes      Weight 2 lb 2 lb      Reps 10-15 10-15             Interval Training      Interval Training No No             Oxygen      Oxygen Continuous Continuous      Liters 6 6             NuStep      Level -- 3      Minutes -- 30      METs -- 2.4             Biostep-RELP      Level 2 --      Minutes 15 --      METs 2 --             Home Exercise Plan      Plans to continue exercise at Home (comment)  bike/crank Home (comment)  bike/crank      Frequency Add 2 additional days to program exercise sessions. Add 2 additional days to program exercise sessions.      Initial Home Exercises Provided 05/13/20 05/13/20              Exercise Comments:   Exercise Goals and Review:   Exercise Goals     Row Name 04/13/20 1552             Exercise Goals   Increase Physical Activity Yes       Intervention Provide advice, education, support and counseling about physical activity/exercise needs.;Develop an individualized exercise prescription for aerobic and resistive training based on initial evaluation findings, risk stratification, comorbidities and participant's personal goals.       Expected Outcomes Short Term: Attend rehab on a regular basis to increase amount of physical  activity.;Long Term: Add in home exercise to make exercise part of routine and to increase amount of physical activity.;Long Term: Exercising regularly at least 3-5 days a week.       Increase Strength and Stamina Yes       Intervention Provide advice, education, support and counseling about physical activity/exercise needs.;Develop an individualized exercise prescription for aerobic and resistive training based on initial evaluation findings, risk stratification, comorbidities and participant's personal goals.       Expected Outcomes Short Term: Increase workloads from initial exercise prescription for resistance, speed, and METs.;Short Term: Perform resistance training exercises routinely during rehab and add in resistance training at home;Long Term: Improve cardiorespiratory fitness, muscular endurance and strength as measured by increased METs and functional capacity (6MWT)       Able to understand and use rate of perceived exertion (RPE) scale Yes       Intervention Provide education and explanation on how to use RPE scale       Expected Outcomes Short Term: Able to use RPE daily in rehab to  express subjective intensity level;Long Term:  Able to use RPE to guide intensity level when exercising independently       Able to understand and use Dyspnea scale Yes       Intervention Provide education and explanation on how to use Dyspnea scale       Expected Outcomes Short Term: Able to use Dyspnea scale daily in rehab to express subjective sense of shortness of breath during exertion;Long Term: Able to use Dyspnea scale to guide intensity level when exercising independently       Knowledge and understanding of Target Heart Rate Range (THRR) Yes       Intervention Provide education and explanation of THRR including how the numbers were predicted and where they are located for reference       Expected Outcomes Short Term: Able to state/look up THRR;Long Term: Able to use THRR to govern intensity when  exercising independently;Short Term: Able to use daily as guideline for intensity in rehab       Able to check pulse independently Yes       Intervention Provide education and demonstration on how to check pulse in carotid and radial arteries.;Review the importance of being able to check your own pulse for safety during independent exercise       Expected Outcomes Short Term: Able to explain why pulse checking is important during independent exercise;Long Term: Able to check pulse independently and accurately       Understanding of Exercise Prescription Yes       Intervention Provide education, explanation, and written materials on patient's individual exercise prescription       Expected Outcomes Short Term: Able to explain program exercise prescription;Long Term: Able to explain home exercise prescription to exercise independently                Exercise Goals Re-Evaluation :  Exercise Goals Re-Evaluation     [place] Name 04/29/20 1413 05/12/20 1042 05/13/20 1445 05/26/20 1414 06/08/20 1554     Exercise Goal Re-Evaluation   Exercise Goals Review Increase Physical Activity;Able to understand and use rate of perceived exertion (RPE) scale;Knowledge and understanding of Target Heart Rate Range (THRR);Understanding of Exercise Prescription;Increase Strength and Stamina;Able to understand and use Dyspnea scale;Able to check pulse independently Increase Physical Activity;Increase Strength and Stamina;Understanding of Exercise Prescription Increase Physical Activity;Increase Strength and Stamina -- --   Comments Reviewed RPE and dyspnea scales, THR and program prescription with pt today.  Pt voiced understanding and was given a copy of goals to take home. Charlotte Cross has completed her first two full days of exercise.  She has had some other things come up, including her sugars, that have interferred with her attendance.  We will contiue to monitor her progress and encourage improved attendance. Pt able to  follow exercise prescription today without complaint.  Will continue to monitor for progression.  Reviewed home exercise with pt today.  Pt plans to use bike/arm machine at home for exercise.  Reviewed THR, pulse, RPE, sign and symptoms, pulse oximetery and when to call 911 or MD.  Also discussed weather considerations and indoor options.  Pt voiced understanding. Called to check on Jackie.  She has been having a hard time with her feet being swollen and possibly around her lungs as well.  She has started some fluid medication and to help with swelling.  We talked about keeping feet elevated when not moving.  She has a follow up call tomorrow and possible appt on Friday.  We talked about returning to rehab next week to keep moving and fluid moving. Out since last review   Expected Outcomes Short: Use RPE daily to regulate intensity. Long: Follow program prescription in THR. Short: Improved reguarly attendance Long: Continue to follow program prescription. Short :  monitor HR and oxygen when exercising at home Long:  improve overall MET level -- --    [place] Name 08/05/20 1510 08/17/20 1303 09/01/20 1337 09/15/20 1419 09/28/20 1606     Exercise Goal Re-Evaluation   Exercise Goals Review Increase Physical Activity;Increase Strength and Stamina;Understanding of Exercise Prescription Increase Physical Activity;Increase Strength and Stamina Increase Physical Activity;Increase Strength and Stamina Increase Physical Activity;Increase Strength and Stamina;Understanding of Exercise Prescription Increase Physical Activity;Increase Strength and Stamina;Understanding of Exercise Prescription   Comments Charlotte Cross returned on 5/18.  She desaturated fairly quickly and was encouraged to use PLB.  She stayed on the NuStep for the whole time. We will continue to monitor her progress. Charlotte Cross has only attended 3 times in May.  Consistent attendance is neceassry to see improvements.  Staff will review attendance policy. Jackies oxygen  has dropped into mid 80s a couple times during exercise.  Staff will review resting if oxygen goes below 88%. Charlotte Cross is doing well in rehab.  She is using her pedal machine at home.  She also has access to a treadmill and bike.  She has leg and arm weights she has been walking with but hips are sore. We talked about just using those for resistance work.  She has looked at a few vidoes but nothing consistent yet. Charlotte Cross is doing well in rehab.  Her attendance has been better.  She is on level 3 on the NuStep.  We will continue to monitor her progress.   Expected Outcomes Short: Improved attendance Long: Continue to improve stamina. Short: Improved attendance Long: Continue to improve stamina. Short: attend consistently Long: build stamina and maintain oxygen at or above 88% during exercise Short: Get back to routine again.  Long: Continue to improve stamina. Short: Continue to attend regularly Long; Continue to improve stamina            Discharge Exercise Prescription (Final Exercise Prescription Changes):  Exercise Prescription Changes - 09/28/20 1600       Response to Exercise   Blood Pressure (Admit) 112/68    Blood Pressure (Exit) 114/72    Heart Rate (Admit) 86 bpm    Heart Rate (Exercise) 93 bpm    Heart Rate (Exit) 88 bpm    Oxygen Saturation (Admit) 93 %    Oxygen Saturation (Exercise) 87 %    Oxygen Saturation (Exit) 94 %    Rating of Perceived Exertion (Exercise) 11    Perceived Dyspnea (Exercise) 0    Symptoms none    Duration Continue with 30 min of aerobic exercise without signs/symptoms of physical distress.    Intensity THRR unchanged      Progression   Progression Continue to progress workloads to maintain intensity without signs/symptoms of physical distress.    Average METs 2.4      Resistance Training   Training Prescription Yes    Weight 2 lb    Reps 10-15      Interval Training   Interval Training No      Oxygen   Oxygen Continuous    Liters 6       NuStep   Level 3    Minutes 30    METs 2.4      Home  Exercise Plan   Plans to continue exercise at Home (comment)   bike/crank   Frequency Add 2 additional days to program exercise sessions.    Initial Home Exercises Provided 05/13/20             Nutrition:  Target Goals: Understanding of nutrition guidelines, daily intake of sodium <1533m, cholesterol <2040m calories 30% from fat and 7% or less from saturated fats, daily to have 5 or more servings of fruits and vegetables.  Education: All About Nutrition: -Group instruction provided by verbal, written material, interactive activities, discussions, models, and posters to present general guidelines for heart healthy nutrition including fat, fiber, MyPlate, the role of sodium in heart healthy nutrition, utilization of the nutrition label, and utilization of this knowledge for meal planning. Follow up email sent as well. Written material given at graduation. [facility]  Education need identified 04/13/20  Date 07/28/20  Educator [place]ruction Review Code 1- Verbalizes Understanding       Biometrics:  Pre Biometrics - 04/13/20 1553       Pre Biometrics   Height 5' 11.1" (1.806 m)    Weight 232 lb 4.8 oz (105.4 kg)    BMI (Calculated) 32.31    Single Leg Stand --   did not do due to SOB/fatigue             Nutrition Therapy Plan and Nutrition Goals:  Nutrition Therapy & Goals - 08/25/20 1534       Nutrition Therapy   RD appointment deferred Yes   Seeing outpatient RD - would not like to meet with this RD            Nutrition Assessments:  MEDIFICTS Score Key: ?70 Need to make dietary changes  40-70 Heart Healthy Diet ? 40 Therapeutic Level Cholesterol Diet  [facility]  Picture Your Plate Total Score on Admission 54      Picture Your Plate Scores: <4<16Unhealthy dietary pattern with much room for improvement. 41-50 Dietary pattern unlikely to meet recommendations for good health and room for improvement. 51-60 More healthful dietary pattern, with some room for improvement.  >60 Healthy dietary pattern, although there may be some specific behaviors that could be improved.   Nutrition Goals Re-Evaluation:  Nutrition Goals Re-Evaluation     [place] 08/25/20 1421 09/15/20 1427           Goals   Current Weight 222 lb (100.7 kg) --      Nutrition Goal Talk to dietician at her doctors office. Short: meet with dietician at her doctors. Long: maintain a heart healthy diet.      Comment JaKennyth Loses not intersted in meeting with the dietician in the program but is meeting with one at her family doctors. JaKennyth Loses doing well with her diet.  She declines meeting with Dietitian in program as she is talking with one in her doctor's office.  She is doing her best to stay away from salt and sugar.  She is watching her fluid and trying to get in enough water.      Expected Outcome Short: meet with dietician at her doctors. Long: maintain a heart healthy diet. Short: Continue to work on heart healthy diet Long: Continue to eat healthy               Nutrition Goals Discharge (Final Nutrition Goals Re-Evaluation):  Nutrition Goals Re-Evaluation - 09/15/20 1427       Goals   Nutrition Goal Short: meet with dietician at her doctors. Long: maintain a heart healthy diet.    Comment Charlotte Cross is doing well with her diet.  She declines meeting with Dietitian in program as she is talking with one in her doctor's office.  She is doing her best to stay away from salt and sugar.  She is watching her fluid and trying to get in enough water.    Expected Outcome Short: Continue to work on heart healthy diet Long: Continue to eat healthy             Psychosocial: Target Goals: Acknowledge presence or absence of significant depression and/or stress, maximize  coping skills, provide positive support system. Participant is able to verbalize types and ability to use techniques and skills needed for reducing stress and depression.   Education: Stress, Anxiety, and Depression - Group verbal and visual presentation to define topics covered.  Reviews how body is impacted by stress, anxiety, and depression.  Also discusses healthy ways to reduce stress and to treat/manage anxiety and depression.  Written material given at graduation. Flowsheet Row Pulmonary Rehab from 08/25/2020 in [facility] and Pulmonary Rehab  Date 08/25/20  Educator AS  Instruction Review Code 1- Verbalizes Understanding       Education: Sleep Hygiene -Provides group verbal and written instruction about how sleep can affect your health.  Define sleep hygiene, discuss sleep cycles and impact of sleep habits. Review good sleep hygiene tips.    Initial Review & Psychosocial Screening:   Quality of Life Scores:  Scores of 19 and below usually indicate a poorer quality of life in these areas.  A difference of  2-3 points is a clinically meaningful difference.  A difference of 2-3 points in the total score of the Quality of Life Index has been associated with significant improvement in overall quality of life, self-image, physical symptoms, and general health in studies assessing change in quality of life.  PHQ-9: Recent Review Flowsheet Data     Depression screen [facility] 2/9 09/15/2020 08/25/2020 04/13/2020 03/06/2017   Decreased Interest 0 1 3 0   Down, Depressed, Hopeless 0 0 1 0   PHQ - 2 Score 0 1 4 0   Altered sleeping _0 Tired, decreased energy _1 Change in appetite 0 0 0 2   Feeling bad or failure about yourself  0 0 0 0   Trouble concentrating 0 0 0 0   Moving slowly or fidgety/restless 0 0 0 0   Suicidal thoughts 0 0 0 0   PHQ-9 Score _2 Difficult doing work/chores Somewhat difficult Somewhat difficult Very difficult Somewhat difficult       Interpretation of Total Score  Total Score Depression Severity:  1-4 = Minimal depression, 5-9 = Mild depression, 10-14 = Moderate depression, 15-19 = Moderately severe depression, 20-27 = Severe depression   Psychosocial Evaluation and Intervention:   Psychosocial Re-Evaluation:  Psychosocial Re-Evaluation     [place] Name 08/25/20 1426 09/15/20 1422           Psychosocial Re-Evaluation   Current issues with Current Sleep Concerns;Current Psychotropic Meds;History of Depression Current Sleep Concerns;Current Psychotropic Meds;History of Depression      Comments Reviewed patient health questionnaire (PHQ-9) with patient for follow up. Previously, patients score indicated signs/symptoms of depression.  Reviewed to see if patient  is improving symptom wise while in program.  Score improved/declined and patient states that it is because she has been able to do more at home such as gardening and painting. Charlotte Cross has returned today after being out for a few sessions.  She had found her sister dead and has been trying to deal with that emotionally, mentally, and physcially. Otherwise, she is doing pretty well considering.  She is back to sleeping pretty good and uses Tylenol PM to help sleep with her pain. She is feeling pretty stable mood wise and well managed.  Her PHQ is down to 4!!  She is feeling better!      Expected Outcomes Short: Continue to attend [facility] regularly for regular exercise and social engagement. Long: Continue to improve symptoms and manage a positive mental state. Short: Continue to get back to exercise routine for mental boost Long: Continue to cope with loss of sister      Interventions Encouraged to attend Pulmonary Rehabilitation for the exercise Encouraged to attend Pulmonary Rehabilitation for the exercise      Continue Psychosocial Services  Follow up required by staff Follow up required by staff               Psychosocial Discharge (Final Psychosocial  Re-Evaluation):  Psychosocial Re-Evaluation - 09/15/20 1422       Psychosocial Re-Evaluation   Current issues with Current Sleep Concerns;Current Psychotropic Meds;History of Depression    Comments Charlotte Cross has returned today after being out for a few sessions.  She had found her sister dead and has been trying to deal with that emotionally, mentally, and physcially. Otherwise, she is doing pretty well considering.  She is back to sleeping pretty good and uses Tylenol PM to help sleep with her pain. She is feeling pretty stable mood wise and well managed.  Her PHQ is down to 4!!  She is feeling better!    Expected Outcomes Short: Continue to get back to exercise routine for mental boost Long: Continue to cope with loss of sister    Interventions Encouraged to attend Pulmonary Rehabilitation for the exercise    Continue Psychosocial Services  Follow up required by staff             Education: Education Goals: Education classes will be provided on a weekly basis, covering required topics. Participant will state understanding/return demonstration of topics presented.  Learning Barriers/Preferences:   General Pulmonary Education Topics:  Infection Prevention: - Provides verbal and written material to individual with discussion of infection control including proper hand washing and proper equipment cleaning during exercise session. Flowsheet Row Pulmonary Rehab from 08/25/2020 in [facility] and Pulmonary Rehab  Date 03/30/20  Educator [facility]  Instruction Review Code 1- Verbalizes Understanding       Falls Prevention: - Provides verbal and written material to individual with discussion of falls prevention and safety. Flowsheet Row Pulmonary Rehab from 08/25/2020 in [facility] and Pulmonary Rehab  Date 03/30/20  Educator [facility]  Instruction Review Code 1- Verbalizes Understanding       Chronic Lung Disease Review: - Group verbal instruction with posters, models, PowerPoint presentations  and videos,  to review new updates, new respiratory medications, new advancements in procedures and treatments. Providing information on websites and "800" numbers for continued self-education. Includes information about supplement oxygen, available portable oxygen systems, continuous and intermittent flow rates, oxygen safety, concentrators, and Medicare reimbursement for oxygen. Explanation of Pulmonary Drugs, including class, frequency, complications, importance of spacers, rinsing mouth after steroid  MDI's, and proper cleaning methods for nebulizers. Review of basic lung anatomy and physiology related to function, structure, and complications of lung disease. Review of risk factors. Discussion about methods for diagnosing sleep apnea and types of masks and machines for OSA. Includes a review of the use of types of environmental controls: home humidity, furnaces, filters, dust mite/pet prevention, HEPA vacuums. Discussion about weather changes, air quality and the benefits of nasal washing. Instruction on Warning signs, infection symptoms, calling MD promptly, preventive modes, and value of vaccinations. Review of effective airway clearance, coughing and/or vibration techniques. Emphasizing that all should Create an Action Plan. Written material given at graduation. Flowsheet Row Pulmonary Rehab from 08/25/2020 in [facility] Cardiac and Pulmonary Rehab  Education need identified 04/13/20       AED/CPR: - Group verbal and written instruction with the use of models to demonstrate the basic use of the AED with the basic ABC's of resuscitation.    Anatomy and Cardiac Procedures: - Group verbal and visual presentation and models provide information about basic cardiac anatomy and function. Reviews the testing methods done to diagnose heart disease and the outcomes of the test results. Describes the treatment choices: Medical Management, Angioplasty, or Coronary Bypass Surgery for treating various heart conditions  including Myocardial Infarction, Angina, Valve Disease, and Cardiac Arrhythmias.  Written material given at graduation. Flowsheet Row Pulmonary Rehab from 08/25/2020 in [facility] Card[facility] and Pulmonary Rehab  Date 05/12/20  Educator [facility]  Instruction Review Code 1- Verbalizes Understanding       Medication Safety: - Group verbal and visual instruction to review commonly prescribed medications for heart and lung disease. Reviews the medication, class of the drug, and side effects. Includes the steps to properly store meds and maintain the prescription regimen.  Written material given at graduation.   Other: -Provides group and verbal instruction on various topics (see comments)   Knowledge Questionnaire Score:  Knowledge Questionnaire Score - 04/13/20 1554       Knowledge Questionnaire Score   Pre Score 16/18 Education Focus: Nutriton and O2 safety              Core Components/Risk Factors/Patient Goals at Admission:  Personal Goals and Risk Factors at Admission - 04/13/20 1554       Core Components/Risk Factors/Patient Goals on Admission    Weight Management Yes;Weight Loss;Obesity    Intervention Weight Management: Develop a combined nutrition and exercise program designed to reach desired caloric intake, while maintaining appropriate intake of nutrient and fiber, sodium and fats, and appropriate energy expenditure required for the weight goal.;Weight Management: Provide education and appropriate resources to help participant work on and attain dietary goals.;Weight Management/Obesity: Establish reasonable short term and long term weight goals.;Obesity: Provide education and appropriate resources to help participant work on and attain dietary goals.    Admit Weight 232 lb 4.8 oz (105.4 kg)    Goal Weight: Short Term 225 lb (102.1 kg)    Goal Weight: Long Term 220 lb (99.8 kg)    Expected Outcomes Short Term: Continue to assess and modify interventions until short term weight is  achieved;Long Term: Adherence to nutrition and physical activity/exercise program aimed toward attainment of established weight goal;Weight Loss: Understanding of general recommendations for a balanced deficit meal plan, which promotes 1-2 lb weight loss per week and includes a negative energy balance of [phone] kcal/d;Understanding recommendations for meals to include 15-35% energy as protein, 25-35% energy from fat, 35-60% energy from carbohydrates, less than 233m of dietary cholesterol, 20-35  gm of total fiber daily;Understanding of distribution of calorie intake throughout the day with the consumption of 4-5 meals/snacks    Improve shortness of breath with ADL's Yes    Intervention Provide education, individualized exercise plan and daily activity instruction to help decrease symptoms of SOB with activities of daily living.    Expected Outcomes Short Term: Improve cardiorespiratory fitness to achieve a reduction of symptoms when performing ADLs;Long Term: Be able to perform more ADLs without symptoms or delay the onset of symptoms    Diabetes Yes    Intervention Provide education about signs/symptoms and action to take for hypo/hyperglycemia.;Provide education about proper nutrition, including hydration, and aerobic/resistive exercise prescription along with prescribed medications to achieve blood glucose in normal ranges: Fasting glucose 65-99 mg/dL    Expected Outcomes Short Term: Participant verbalizes understanding of the signs/symptoms and immediate care of hyper/hypoglycemia, proper foot care and importance of medication, aerobic/resistive exercise and nutrition plan for blood glucose control.;Long Term: Attainment of HbA1C < 7%.    Hypertension Yes    Intervention Provide education on lifestyle modifcations including regular physical activity/exercise, weight management, moderate sodium restriction and increased consumption of fresh fruit, vegetables, and low fat dairy, alcohol moderation, and  smoking cessation.;Monitor prescription use compliance.    Expected Outcomes Long Term: Maintenance of blood pressure at goal levels.;Short Term: Continued assessment and intervention until BP is < 140/43m HG in hypertensive participants. < 130/830mHG in hypertensive participants with diabetes, heart failure or chronic kidney disease.    Lipids Yes    Intervention Provide education and support for participant on nutrition & aerobic/resistive exercise along with prescribed medications to achieve LDL <7083mHDL >22m75m  Expected Outcomes Short Term: Participant states understanding of desired cholesterol values and is compliant with medications prescribed. Participant is following exercise prescription and nutrition guidelines.;Long Term: Cholesterol controlled with medications as prescribed, with individualized exercise RX and with personalized nutrition plan. Value goals: LDL < 70mg81mL > 40 mg.             Education:Diabetes - Individual verbal and written instruction to review signs/symptoms of diabetes, desired ranges of glucose level fasting, after meals and with exercise. Acknowledge that pre and post exercise glucose checks will be done for 3 sessions at entry of program. Flowsheet Row [facility]  Date 04/13/20  Educator [facility] Code 1- Verbalizes Understanding       Know Your Numbers and Heart Failure: - Group verbal and visual instruction to discuss disease risk factors for cardiac and pulmonary disease and treatment options.  Reviews associated critical values for Overweight/Obesity, Hypertension, Cholesterol, and Diabetes.  Discusses basics of heart failure: signs/symptoms and treatments.  Introduces Heart Failure Zone chart for action plan for heart failure.  Written material given at graduation.   Core Components/Risk Factors/Patient Goals Review:   Goals and Risk Factor Review     Row Name 08/25/20  1421 09/15/20 1429           Core Components/Risk Factors/Patient Goals Review   Personal Goals Review Improve shortness of breath with ADL's Improve shortness of breath with ADL's;Weight Management/Obesity;Diabetes;Hypertension      Review Spoke to patient about their shortness of breath and what they can do to improve. Patient has been informed of breathing techniques when starting the program. Patient is informed to tell staff if they have had any med changes and that certain meds they are taking or not taking can  be causing shortness of breath. Charlotte Cross is doing well in rehab.  Her sugars are doing well and she checks them pretty routinely at home (around 6x a day).  Her weight is holding steady and she's glad that she is not gaining anything.  Her pressures have been doing well and she continues to check them at home too.  Her breathing is getting better, but she still has days where it gets worse.  She has noted that exercise days it does better.      Expected Outcomes Short: Attend [facility] regularly to improve shortness of breath with ADL's. Long: maintain independence with ADL's Short: Continue to work on breathing Long: Continue to monitor risk factors.               Core Components/Risk Factors/Patient Goals at Discharge (Final Review):   Goals and Risk Factor Review - 09/15/20 1429       Core Components/Risk Factors/Patient Goals Review   Personal Goals Review Improve shortness of breath with ADL's;Weight Management/Obesity;Diabetes;Hypertension    Review Charlotte Cross is doing well in rehab.  Her sugars are doing well and she checks them pretty routinely at home (around 6x a day).  Her weight is holding steady and she's glad that she is not gaining anything.  Her pressures have been doing well and she continues to check them at home too.  Her breathing is getting better, but she still has days where it gets worse.  She has noted that exercise days it does better.    Expected Outcomes  Short: Continue to work on breathing Long: Continue to monitor risk factors.             ITP Comments:  ITP Comments     Row Name 04/13/20 1544 04/14/20 0937 04/29/20 1411 05/12/20 0615 05/26/20 1413   ITP Comments Completed 6MWT and gym orientation. Initial ITP created and sent for review to Dr. Emily Filbert, Medical Director. 30 Day review completed. Medical Director ITP review done, changes made as directed, and signed approval by Medical Director.   New to program First full day of exercise!  Patient was oriented to gym and equipment including functions, settings, policies, and procedures.  Patient's individual exercise prescription and treatment plan were reviewed.  All starting workloads were established based on the results of the 6 minute walk test done at initial orientation visit.  The plan for exercise progression was also introduced and progression will be customized based on patient's performance and goals. 30 Day review completed. Medical Director ITP review done, changes made as directed, and signed approval by Medical Director. Called to check on Jackie.  She has been having a hard time with her feet being swollen and possibly around her lungs as well.  She has started some fluid medication and to help with swelling.  We talked about keeping feet elevated when not moving.  She has a follow up call tomorrow and possible appt on Friday.  We talked about returning to rehab next week to keep moving and fluid moving.    Row Name 06/08/20 1553 06/09/20 0721 06/21/20 1600 07/06/20 0800 07/07/20 0937   ITP Comments Called to check on pt.  Out since 05/13/20.  Left message. 30 Day review completed. Medical Director ITP review done, changes made as directed, and signed approval by Medical Director. 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  Her f/u is scheduled for 06/24/20, will need clearance to return. Patient has not been to rehba since last review. - Toe  amputated 3/30, f/u was sch for 4/7. Have  not heard back regarding follow up or clearance. 30 Day review completed. Medical Director ITP review done, changes made as directed, and signed approval by Medical Director.    [place] Name 07/22/20 1518 08/03/20 1051 08/04/20 0631 09/01/20 0701 09/29/20 0630   ITP Comments Charlotte Cross has not attended since last review Charlotte Cross just came back to rehab 5/11; previously has not attended since February. She cancelled the RD call 5/16 and did not come to class 5/16 with no call. Unable to complete goals. 30 Day review completed. Medical Director ITP review done, changes made as directed, and signed approval by Medical Director. 30 Day review completed. Medical Director ITP review done, changes made as directed, and signed approval by Medical Director. 30 Day review completed. Medical Director ITP review done, changes made as directed, and signed approval by Medical Director.            Comments:

## 2020-09-29 NOTE — Progress Notes (Signed)
Daily Session Note  Patient Details  Name: MYRELLA FAHS MRN: [id number] Date of Birth: 1965/12/19 Referring Provider:   Flowsheet Row Pulmonary Rehab from 04/13/2020 in [facility] Cardiac and Pulmonary Rehab  Referring Provider Vonita Moss MD       Encounter Date: 09/29/2020  Check In:  Session Check In - 09/29/20 1508       Check-In   Supervising physician immediately available to respond to emergencies See telemetry face sheet for immediately available ER MD    Location [facility]-Cardiac & Pulmonary Rehab    Staff Present Heath Lark, RN, BSN, CCRP;Kelly Bollinger, MPA, RN;Melissa [place], RDN, Tawanna Solo, MS, ASCM CEP, Exercise Physiologist    Virtual Visit No    Medication changes reported     No    Fall or balance concerns reported    No    Warm-up and Cool-down Performed on first and last piece of equipment    Resistance Training Performed Yes    VAD Patient? No    PAD/SET Patient? No      Pain Assessment   Currently in Pain? No/denies                Social History   Tobacco Use  Smoking Status Former   Packs/day: 0.25   Years: 6.00   Pack years: 1.50   Types: Cigarettes   Quit date: 03/21/1987   Years since quitting: 33.5  Smokeless Tobacco Never    Goals Met:  Proper associated with RPD/PD & O2 Sat Independence with exercise equipment Exercise tolerated well No report of cardiac concerns or symptoms  Goals Unmet:  Not Applicable  Comments: Pt able to follow exercise prescription today without complaint.  Will continue to monitor for progression.    Dr. Emily Filbert is Medical Director for [place].  Dr. Ottie Glazier is Medical Director for [facility].

## 2020-09-30 NOTE — Unmapped (Signed)
Complex Case Management  SUMMARY NOTE    High Risk Care Coordinator  spoke with patient and verified correct patient using two identifiers today to introduce the Complex Case Management program.     Discussed the following:  Program Services, Expectations of participation and Verified Demographics    Program status: Declined   Patient states that she does not need the services of the Complex Case Management program at this time. Care Coordinator has voiced understanding and will send our contact information to patient via her [place] My Chart shall she need to utilize our services in the future as patient has voiced understanding.       Karen Barber - High Risk Care Coordinator   [facility]  [address], [address] [place], [place] [address]  P: [phone] F: [phone]  [email or web address]

## 2020-10-03 DIAGNOSIS — D869 Sarcoidosis, unspecified: Principal | ICD-10-CM

## 2020-10-03 DIAGNOSIS — J449 Chronic obstructive pulmonary disease, unspecified: Principal | ICD-10-CM

## 2020-10-03 MED ORDER — ALBUTEROL SULFATE 2.5 MG/3 ML (0.083 %) SOLUTION FOR NEBULIZATION
Freq: Four times a day (QID) | RESPIRATORY_TRACT | 2 refills | 10 days | Status: CP | PRN
Start: 2020-10-03 — End: 2021-10-03

## 2020-10-03 MED ORDER — ALBUTEROL SULFATE HFA 90 MCG/ACTUATION AEROSOL INHALER
RESPIRATORY_TRACT | 1 refills | 0 days | Status: CP | PRN
Start: 2020-10-03 — End: 2021-10-03

## 2020-10-04 ENCOUNTER — Telehealth: Payer: Self-pay

## 2020-10-04 NOTE — Telephone Encounter (Signed)
Ms. Hook called to ask about taking an additional diuretic pill because her feet and ankles are swollen and increase in weight per the patient of 3-4 pounds. She advised that she is prescribed to take one a day and had already taken today's dose without relief. She stated that she is drinking water, but not excessive amounts. This nurse advised the patient to contact her prescribing physician following our phone call about her concerns of weight increase and fluid retention. Patient was also advised should she develop increased difficulty breathing to call 911. Patient stated understanding and that she would be contacting her doctor once our call was complete.

## 2020-10-05 NOTE — Unmapped (Signed)
[facility] Clinical Assessment & Refill Coordination Note    Karen Barber, DOB: November 06, 1965  Phone: [phone] (home) [phone] (work)    All above HIPAA information was verified with patient.     Was a Nurse, learning disability used for this call? No    Specialty Medication(s):   CF/Pulmonary: -Nucala 100mg /ml     Current Outpatient Medications   Medication Sig Dispense Refill   ??? ACCU-CHEK FASTCLIX LANCET DRUM Misc USE TO CHECK GLUCOSE 4 TIMES DAILY BEFORE MEAL(S) AND NIGHTLY     ??? ACCU-CHEK GUIDE GLUCOSE METER Misc 1 Device by Other route Four (4) times a day (before meals and nightly). AS DIRECTED 1 kit 0   ??? acetaminophen (TYLENOL) 325 MG tablet Take 2 tablets (650 mg total) by mouth every four (4) hours as needed. 30 tablet 1   ??? albuterol 2.5 mg /3 mL (0.083 %) nebulizer solution Inhale 3 mL (2.5 mg total) by nebulization every six (6) hours as needed for wheezing or shortness of breath (airway clearance/cough). 120 mL 2   ??? albuterol HFA 90 mcg/actuation inhaler Inhale 2 puffs every four (4) hours as needed for wheezing or shortness of breath. 24 g 1   ??? amLODIPine (NORVASC) 5 MG tablet Take 5 mg by mouth daily. Added per patient     ??? aspirin (ECOTRIN) 81 MG tablet Take 81 mg by mouth daily.     ??? blood sugar diagnostic Strp by Other route Three (3) times a day. ACCU-CHEK Guide meter 300 strip 1   ??? budesonide-formoteroL (SYMBICORT) 160-4.5 mcg/actuation inhaler Inhale 2 puffs Two (2) times a day. 10.2 g 3   ??? cyclobenzaprine (FLEXERIL) 10 MG tablet Take 1 tablet by mouth three times daily as needed 90 tablet 0   ??? empty container Misc USE AS DIRECTED 1 each 2   ??? furosemide (LASIX) 20 MG tablet Take 20 mg by mouth daily as needed.     ??? gabapentin (NEURONTIN) 300 MG capsule Take 1 capsule (300 mg total) by mouth Three (3) times a day. 270 capsule 1   ??? insulin ASPART (NOVOLOG U-100 INSULIN ASPART) 100 unit/mL injection Take 1-20 units SQ QID AC and HS as directed 20 mL 1   ??? insulin glargine (BASAGLAR, LANTUS) 100 unit/mL (3 mL) injection pen Inject 0.24 mL (24 Units total) under the skin nightly. 21 mL 1   ??? ipratropium (ATROVENT) 0.02 % nebulizer solution Inhale 2.5 mL (500 mcg total) by nebulization Four (4) times a day. (Patient taking differently: Inhale 500 mcg by nebulization Two (2) times a day. ) 900 mL 1   ??? ketoconazole (NIZORAL) 2 % cream Apply 1 application topically Two (2) times a day. (Patient not taking: Reported on 06/08/2020) 60 g 5   ??? MEDICAL SUPPLY ITEM Accucheck Glide Glocumeter for home glucose checks. Check blood glucose ACHS. 1 Package 0   ??? mepolizumab (NUCALA) 100 mg/mL AtIn Inject the contents of 1 syringe (100 mg) under the skin every twenty-eight (28) days. 1 mL 12   ??? metFORMIN (GLUCOPHAGE) 1000 MG tablet Take 1 tablet (1,000 mg total) by mouth 2 (two) times a day with meals. 180 tablet 1   ??? miscellaneous medical supply Misc Nebulizer machine and tubing. Patients machine is 57 years old. 1 each 0   ??? OXYGEN-AIR DELIVERY SYSTEMS MISC Dose: 2-3 LPM     ??? posaconazole (NOXAFIL) 100 mg delayed released tablet Take 2 tablets (200 mg total) by mouth in the morning. 60  tablet 2   ??? pravastatin (PRAVACHOL) 40 MG tablet Take 1 tablet (40 mg total) by mouth daily with evening meal. 90 tablet 1   ??? predniSONE (DELTASONE) 5 MG tablet Take 5 mg by mouth daily. (Patient not taking: Reported on 06/08/2020)     ??? sildenafiL, pulm.hypertension, (REVATIO) 20 mg tablet Take 1 tablet (20 mg total) by mouth Three (3) times a day. 270 tablet 1   ??? treprostiniL (TYVASO) 1.74 mg/2.9 mL (0.6 mg/mL) Nebu Inhale 18 mcg four (4) times a day.        No current facility-administered medications for this visit.        Changes to medications: [name] reports no changes at this time.    No Known Allergies    Changes to allergies: No    SPECIALTY MEDICATION ADHERENCE         Medication Adherence    Patient reported X missed doses in the last month: 0  Specialty Medication: Nucala 100mg /ml  Patient is on additional specialty medications: No  Informant: patient          Specialty medication(s) dose(s) confirmed: Regimen is correct and unchanged.     Are there any concerns with adherence? No    Adherence counseling provided? Not needed    CLINICAL MANAGEMENT AND INTERVENTION      Clinical Benefit Assessment:    Do you feel the medicine is effective or helping your condition? Yes    Clinical Benefit counseling provided? feeling good, no asthma exxacerbations    Adverse Effects Assessment:    Are you experiencing any side effects? No    Are you experiencing difficulty administering your medicine? No    Quality of Life Assessment:    Quality of Life    Rheumatology  On a scale of 1 - 10 with 1 representing not at all and 10 representing completely - how has your rheumatologic condition affected your:  Oncology  Dermatology          How many days over the past month did your asthma  keep you from your normal activities? For example, brushing your teeth or getting up in the morning. 0    Have you discussed this with your provider? Not needed    Acute Infection Status:    Acute infections noted within Epic:  No active infections  Patient reported infection: None    Therapy Appropriateness:    Is therapy appropriate? Yes, therapy is appropriate and should be continued    DISEASE/MEDICATION-SPECIFIC INFORMATION      For patients on injectable medications: Patient currently has 0 doses left.  Next injection is scheduled for 7/22.    PATIENT SPECIFIC NEEDS     - Does the patient have any physical, cognitive, or cultural barriers? No    - Is the patient high risk? No    - Does the patient require a Care Management Plan? No     - Does the patient require physician intervention or other additional services (i.e. nutrition, smoking cessation, social work)? No      SHIPPING     Specialty Medication(s) to be Shipped:   CF/Pulmonary: -Nucala 100mg /ml    Other medication(s) to be shipped: No additional medications requested for fill at this time     Changes to insurance: No    Delivery Scheduled: Yes, Expected medication delivery date: 7/22.     Medication will be delivered via Same Day Courier to the confirmed prescription address in [place].    The  patient will receive a drug information handout for each medication shipped and additional FDA Medication Guides as required.  Verified that patient has previously received a Conservation officer, historic buildings and a Surveyor, mining.    The patient or caregiver noted above participated in the development of this care plan and knows that they can request review of or adjustments to the care plan at any time.      All of the patient's questions and concerns have been addressed.    Julianne Rice   [facility] Specialty Pharmacist

## 2020-10-07 ENCOUNTER — Telehealth: Payer: Self-pay

## 2020-10-07 NOTE — Telephone Encounter (Signed)
LMOM re: grad vs discharge

## 2020-10-08 MED FILL — NUCALA 100 MG/ML SUBCUTANEOUS AUTO-INJECTOR: SUBCUTANEOUS | 28 days supply | Qty: 1 | Fill #4

## 2020-10-18 ENCOUNTER — Telehealth: Payer: Self-pay

## 2020-10-18 NOTE — Telephone Encounter (Signed)
Left message for patient for Pulmonary Rehab- asking for call back.

## 2020-10-19 DIAGNOSIS — D869 Sarcoidosis, unspecified: Secondary | ICD-10-CM

## 2020-10-19 NOTE — Progress Notes (Signed)
Discharge Progress Report  Patient Details  Name: Charlotte Cross MRN: [id number] Date of Birth: [birth year] Referring Provider:   Flowsheet Row Pulmonary Rehab from 04/13/2020 in [facility] Cardiac and Pulmonary Rehab  Referring Provider Vonita Moss MD        Number of Visits: 16  Reason for Discharge:  Early Exit:  Lack of attendance  Smoking History:  Social History   Tobacco Use  Smoking Status Former   Packs/day: 0.25   Years: 6.00   Pack years: 1.50   Types: Cigarettes   Quit date: 03/21/1987   Years since quitting: 33.6  Smokeless Tobacco Never    Diagnosis:  Sarcoidosis  ADL UCSD:   Initial Exercise Prescription:   Discharge Exercise Prescription (Final Exercise Prescription Changes):  Exercise Prescription Changes - 09/28/20 1600       Response to Exercise   Blood Pressure (Admit) 112/68    Blood Pressure (Exit) 114/72    Heart Rate (Admit) 86 bpm    Heart Rate (Exercise) 93 bpm    Heart Rate (Exit) 88 bpm    Oxygen Saturation (Admit) 93 %    Oxygen Saturation (Exercise) 87 %    Oxygen Saturation (Exit) 94 %    Rating of Perceived Exertion (Exercise) 11    Perceived Dyspnea (Exercise) 0    Symptoms none    Duration Continue with 30 min of aerobic exercise without signs/symptoms of physical distress.    Intensity THRR unchanged      Progression   Progression Continue to progress workloads to maintain intensity without signs/symptoms of physical distress.    Average METs 2.4      Resistance Training   Training Prescription Yes    Weight 2 lb    Reps 10-15      Interval Training   Interval Training No      Oxygen   Oxygen Continuous    Liters 6      NuStep   Level 3    Minutes 30    METs 2.4      Home Exercise Plan   Plans to continue exercise at Home (comment)   bike/crank   Frequency Add 2 additional days to program exercise sessions.    Initial Home Exercises Provided 05/13/20             Functional  Capacity:   Psychological, QOL, Others - Outcomes: PHQ 2/9: Depression screen [facility] 2/9 09/15/2020 08/25/2020 04/13/2020 03/06/2017  Decreased Interest 0 1 3 0  Down, Depressed, Hopeless 0 0 1 0  PHQ - 2 Score 0 1 4 0  Altered sleeping _0 Tired, decreased energy _1 Change in appetite 0 0 0 2  Feeling bad or failure about yourself  0 0 0 0  Trouble concentrating 0 0 0 0  Moving slowly or fidgety/restless 0 0 0 0  Suicidal thoughts 0 0 0 0  PHQ-9 Score _2 Difficult doing work/chores Somewhat difficult Somewhat difficult Very difficult Somewhat difficult    Personal Goals: Goals established at orientation with interventions provided to work toward goal.     Nutrition:  Nutrition Therapy & Goals - 08/25/20 1534       Nutrition Therapy   RD appointment deferred Yes   Seeing outpatient RD - would not like to meet with this RD            Goals reviewed with patient; copy given to patient.

## 2020-10-19 NOTE — Progress Notes (Signed)
Pulmonary Individual Treatment Plan  Patient Details  Name: Charlotte Cross MRN: [id number] Date of Birth: [birth year] Referring Provider:   Flowsheet Row Pulmonary Rehab from 04/13/2020 in [facility] and Pulmonary Rehab  Referring Provider Vonita Moss MD       Initial Encounter Date:  Flowsheet Row Pulmonary Rehab from 04/13/2020 in [facility] Cardiac and Pulmonary Rehab  Date 04/13/20       Visit Diagnosis: Sarcoidosis  Patient's Home Medications on Admission:  Current Outpatient Medications:    Accu-Chek FastClix Lancets MISC, USE TO CHECK GLUCOSE 4 TIMES DAILY BEFORE MEAL(S) AND NIGHTLY, Disp: , Rfl:    acetaminophen (TYLENOL) 325 MG tablet, Take by mouth., Disp: , Rfl:    albuterol (PROVENTIL HFA;VENTOLIN HFA) 108 (90 BASE) MCG/ACT inhaler, Inhale 2 puffs into the lungs every 6 (six) hours as needed for wheezing or shortness of breath., Disp: , Rfl:    albuterol (PROVENTIL) (2.5 MG/3ML) 0.083% nebulizer solution, Inhale 2.5 mg into the lungs every 6 (six) hours as needed for wheezing or shortness of breath. , Disp: , Rfl:    albuterol (VENTOLIN HFA) 108 (90 Base) MCG/ACT inhaler, Inhale into the lungs. (Patient not taking: Reported on 03/30/2020), Disp: , Rfl:    amLODipine (NORVASC) 5 MG tablet, Take by mouth., Disp: , Rfl:    aspirin EC 325 MG tablet, Take 325 mg by mouth daily., Disp: , Rfl:    Blood Glucose Monitoring Suppl (ACCU-CHEK GUIDE) w/Device KIT, 1 Device by Other route Four (4) times a day (before meals and nightly). AS DIRECTED, Disp: , Rfl:    budesonide (PULMICORT) 0.5 MG/2ML nebulizer solution, Inhale 2 mLs (0.5 mg total) into the lungs every 12 (twelve) hours., Disp: 120 mL, Rfl: 0   budesonide-formoterol (SYMBICORT) 160-4.5 MCG/ACT inhaler, Inhale into the lungs., Disp: , Rfl:    cyclobenzaprine (FLEXERIL) 10 MG tablet, Take 10 mg by mouth 3 (three) times daily as needed for muscle spasms. , Disp: , Rfl:    feeding supplement, ENSURE ENLIVE, (ENSURE ENLIVE) LIQD,  Take 237 mLs by mouth daily. (Patient not taking: Reported on 03/30/2020), Disp: 237 mL, Rfl: 12   furosemide (LASIX) 20 MG tablet, Take by mouth., Disp: , Rfl:    gabapentin (NEURONTIN) 300 MG capsule, Take 300 mg by mouth 3 (three) times daily., Disp: , Rfl:    glucose blood (PRECISION QID TEST) test strip, by Other route Three (3) times a day. ACCU-CHEK Guide meter, Disp: , Rfl:    hydrochlorothiazide (HYDRODIURIL) 25 MG tablet, Take 25 mg by mouth daily., Disp: , Rfl:    insulin aspart (NOVOLOG) 100 UNIT/ML injection, INJECT 1 TO 20 UNIT(S) SUBCUTANEOUSLY 4 TIMES DAILY BEFORE MEALS AND AT BEDTIME AS DIRECTED, Disp: , Rfl:    Insulin Glargine (BASAGLAR KWIKPEN) 100 UNIT/ML, 18 Units nightly, Disp: , Rfl:    ipratropium (ATROVENT) 0.02 % nebulizer solution, 2 (two) times daily, Disp: , Rfl:    ketoconazole (NIZORAL) 2 % cream, Apply topically., Disp: , Rfl:    levofloxacin (LEVAQUIN) 750 MG tablet, Take 1 tablet (750 mg total) by mouth daily. (Patient not taking: Reported on 03/30/2020), Disp: 5 tablet, Rfl: 0   lisinopril (PRINIVIL,ZESTRIL) 10 MG tablet, Take 10 mg by mouth daily.  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[place])    COPD (chronic obstructive pulmonary disease) ([place])    Diabetes mellitus without complication ([place])    Hypertension    Sarcoidosis     Tobacco Use: Social History   Tobacco Use  Smoking Status Former   Packs/day: 0.25   Years: 6.00   Pack years:  1.50   Types: Cigarettes   Quit date: 03/21/1987   Years since quitting: 33.6  Smokeless Tobacco Never    Labs: Recent Review Flowsheet Data     Labs for ITP Cardiac and Pulmonary Rehab Latest Ref Rng & Units 12/24/2014 08/26/2016   Hemoglobin A1c 4.8 - 5.6 % - 12.3(H)   HCO3 21.0 - 28.0 mEq/L 29.2(H) -        Pulmonary Assessment Scores:   UCSD: Self-administered rating of dyspnea associated with activities of daily living (ADLs) 6-point scale (0 = "not at all" to 5 = "maximal or unable to do because of breathlessness")  Scoring Scores range from 0 to 120.  Minimally important difference is 5 units  CAT: CAT can identify the health impairment of COPD patients and is better correlated with disease progression.  CAT has a scoring range of zero to 40. The CAT score is classified into four groups of low (less than 10), medium (10 - 20), high (21-30) and very high (31-40) based on the impact level of disease on health status. A CAT score over 10 suggests significant symptoms.  A worsening CAT score could be explained by an exacerbation, poor medication adherence, poor inhaler technique, or progression of COPD or comorbid conditions.  CAT MCID is 2 points  mMRC: mMRC (Modified Medical Research Council) Dyspnea Scale is used to assess the degree of baseline functional disability in patients of respiratory disease due to dyspnea. No minimal important difference is established. A decrease in score of 1 point or greater is considered a positive change.   Pulmonary Function Assessment:   Exercise Target Goals: Exercise Program Goal: Individual exercise prescription set using results from initial 6 min walk test and THRR while considering  patient's activity barriers and safety.   Exercise Prescription Goal: Initial exercise prescription builds to 30-45 minutes a day of aerobic activity, 2-3 days per week.  Home exercise guidelines will be given to patient during program as part of exercise  prescription that the participant will acknowledge.  Education: Aerobic Exercise: - Group verbal and visual presentation on the components of exercise prescription. Introduces F.I.T.T principle from ACSM for exercise prescriptions.  Reviews F.I.T.T. principles of aerobic exercise including progression. Written material given at graduation.   Education: Resistance Exercise: - Group verbal and visual presentation on the components of exercise prescription. Introduces F.I.T.T principle from ACSM for exercise prescriptions  Reviews F.I.T.T. principles of resistance exercise including progression. Written material given at graduation. Flowsheet Row Pulmonary Rehab from 08/25/2020 in [facility] Cardiac and Pulmonary Rehab  Date 05/12/20  Educator AS  Instruction Review Code 1- Verbalizes Understanding        Education: Exercise & Equipment Safety: - Individual verbal instruction and demonstration of equipment use and safety with use of the equipment. Flowsheet Row Pulmonary Rehab from 08/25/2020 in [facility] Cardiac and  Pulmonary Rehab  Date 03/30/20  Educator [facility]  Instruction Review Code 1- Verbalizes Understanding       Education: Exercise Physiology & General Exercise Guidelines: - Group verbal and written instruction with models to review the exercise physiology of the cardiovascular system and associated critical values. Provides general exercise guidelines with specific guidelines to those with heart or lung disease.    Education: Flexibility, Balance, Mind/Body Relaxation: - Group verbal and visual presentation with interactive activity on the components of exercise prescription. Introduces F.I.T.T principle from ACSM for exercise prescriptions. Reviews F.I.T.T. principles of flexibility and balance exercise training including progression. Also discusses the mind body connection.  Reviews various relaxation techniques to help reduce and manage stress (i.e. Deep breathing, progressive muscle  relaxation, and visualization). Balance handout provided to take home. Written material given at graduation.   Activity Barriers & Risk Stratification:   6 Minute Walk:  Oxygen Initial Assessment:   Oxygen Re-Evaluation:  Oxygen Re-Evaluation     Row Name 04/29/20 1414 08/25/20 1417 09/15/20 1431         Program Oxygen Prescription   Program Oxygen Prescription Continuous;E-Tanks Continuous;E-Tanks Continuous;E-Tanks     Liters per minute _0 Home Oxygen       Home Oxygen Device -- Home Concentrator;E-Tanks;Portable Concentrator Home Concentrator;E-Tanks;Portable Concentrator     Sleep Oxygen Prescription Continuous Continuous Continuous     Liters per minute _1 Home Exercise Oxygen Prescription Continuous Continuous Continuous     Liters per minute _2 Home Resting Oxygen Prescription Continuous Continuous Continuous     Liters per minute _3 Compliance with Home Oxygen Use Yes Yes Yes           Goals/Expected Outcomes       Short Term Goals To learn and exhibit compliance with exercise, home and travel O2 prescription;To learn and understand importance of monitoring SPO2 with pulse oximeter and demonstrate accurate use of the pulse oximeter.;To learn and understand importance of maintaining oxygen saturations>88%;To learn and demonstrate proper pursed lip breathing techniques or other breathing techniques. ;To learn and demonstrate proper use of respiratory medications Other;To learn and exhibit compliance with exercise, home and travel O2 prescription To learn and exhibit compliance with exercise, home and travel O2 prescription;To learn and understand importance of maintaining oxygen saturations>88%;To learn and understand importance of monitoring SPO2 with pulse oximeter and demonstrate accurate use of the pulse oximeter.;To learn and demonstrate proper pursed lip breathing techniques or other breathing techniques. ;To learn and demonstrate  proper use of respiratory medications     Long  Term Goals Exhibits compliance with exercise, home  and travel O2 prescription;Verbalizes importance of monitoring SPO2 with pulse oximeter and return demonstration;Maintenance of O2 saturations>88%;Exhibits proper breathing techniques, such as pursed lip breathing or other method taught during program session;Compliance with respiratory medication;Demonstrates proper use of MDI's Other;Exhibits compliance with exercise, home  and travel O2 prescription Exhibits compliance with exercise, home  and travel O2 prescription;Verbalizes importance of monitoring SPO2 with pulse oximeter and return demonstration;Maintenance of O2 saturations>88%;Exhibits proper breathing techniques, such as pursed lip breathing or other method taught during program session;Compliance with respiratory medication;Demonstrates proper use of MDI's     Comments Reviewed PLB technique with pt.  Talked about how it works and it's importance in maintaining their exercise saturations. Kennyth Lose had her sleep study done and is  going to find out in a couple weeks if she needs a CPAP or BIPAP to sleep. Informed her to let staff know how her results are. If she does have sleep apnea, wearing a machine at night would help her condition. She has noted that she is trying to get back to 5L on her pulsed tank.  She said that when she checks her sats at home they will drop on the pulsed flow.  She is a mouth breather so she is not getting in enough oxygen on pulsed flow.  We talked about focusing in on pursed lip breathing and using her nose to breathe especially with the oxygen cannual.     Goals/Expected Outcomes Short: Become more profiecient at using PLB.   Long: Become independent at using PLB. Short: get results of sleep study. Long: wear home unit if ordered one. Short: Continue to work on breathing Long: Continue to use PLB.             Oxygen Discharge (Final Oxygen Re-Evaluation):  Oxygen  Re-Evaluation - 09/15/20 1431       Program Oxygen Prescription   Program Oxygen Prescription Continuous;E-Tanks    Liters per minute 6      Home Oxygen   Home Oxygen Device Home Concentrator;E-Tanks;Portable Concentrator    Sleep Oxygen Prescription Continuous    Liters per minute 6    Home Exercise Oxygen Prescription Continuous    Liters per minute 5    Home Resting Oxygen Prescription Continuous    Liters per minute 4    Compliance with Home Oxygen Use Yes      Goals/Expected Outcomes   Short Term Goals To learn and exhibit compliance with exercise, home and travel O2 prescription;To learn and understand importance of maintaining oxygen saturations>88%;To learn and understand importance of monitoring SPO2 with pulse oximeter and demonstrate accurate use of the pulse oximeter.;To learn and demonstrate proper pursed lip breathing techniques or other breathing techniques. ;To learn and demonstrate proper use of respiratory medications    Long  Term Goals Exhibits compliance with exercise, home  and travel O2 prescription;Verbalizes importance of monitoring SPO2 with pulse oximeter and return demonstration;Maintenance of O2 saturations>88%;Exhibits proper breathing techniques, such as pursed lip breathing or other method taught during program session;Compliance with respiratory medication;Demonstrates proper use of MDI's    Comments She has noted that she is trying to get back to 5L on her pulsed tank.  She said that when she checks her sats at home they will drop on the pulsed flow.  She is a mouth breather so she is not getting in enough oxygen on pulsed flow.  We talked about focusing in on pursed lip breathing and using her nose to breathe especially with the oxygen cannual.    Goals/Expected Outcomes Short: Continue to work on breathing Long: Continue to use PLB.             Initial Exercise Prescription:   Perform Capillary Blood Glucose checks as needed.  Exercise  Prescription Changes:   Exercise Prescription Changes     Row Name 05/12/20 1000 08/05/20 1500 08/17/20 1300 09/01/20 1300 09/16/20 1400     Response to Exercise   Blood Pressure (Admit) 132/64 124/64 136/70 142/70 130/70   Blood Pressure (Exercise) 124/64 124/70 132/60 128/78 138/60   Blood Pressure (Exit) 102/64 122/60 122/76 102/70 106/64   Heart Rate (Admit) 100 bpm 110 bpm 100 bpm 89 bpm 90 bpm   Heart Rate (Exercise) 108 bpm 104 bpm 97  bpm 100 bpm 89 bpm   Heart Rate (Exit) 93 bpm 96 bpm 92 bpm 88 bpm 83 bpm   Oxygen Saturation (Admit) 90 % 88 % 94 % 91 % 87 %   Oxygen Saturation (Exercise) 89 % 80 % 89 % 85 % 87 %   Oxygen Saturation (Exit) 95 % 88 % 99 % 97 % 98 %   Rating of Perceived Exertion (Exercise) _0 Perceived Dyspnea (Exercise) _1 Symptoms SOB, fatigue SOB SOB SOB SOB   Comments -- 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 Continue to progress workloads to maintain intensity without signs/symptoms of physical distress. Continue to progress workloads to maintain intensity without signs/symptoms of physical distress. Continue to progress workloads to maintain intensity without signs/symptoms of physical distress. Continue to progress workloads to maintain intensity without signs/symptoms of physical distress.   Average METs 2.35 2 1.25 2.5 2      Resistance Training   Training Prescription _3    Weight 3 lb 2 lb 2 lb 2 lb 2 lb   Reps 10-15 10-15 10-15 10-15 10-15     Interval Training   Interval Training -- No No No No     Oxygen   Oxygen _4    Liters _5 NuStep   Level 1 3 -- -- --   Minutes 15 30 -- -- --   METs 2.7 2 -- -- --     Biostep-RELP   Level 1 -- _6 Minutes 15 -- _7 METs 2 -- 1.25 2.5 2     Home Exercise Plan   Plans to continue exercise at -- Home (comment)  bike/crank Home (comment)  bike/crank Home (comment)  bike/crank Home (comment)  bike/crank   Frequency -- Add 2 additional days to program exercise sessions. Add 2 additional days to program exercise sessions. Add 2 additional days to program exercise sessions. Add 2 additional days to program exercise sessions.   Initial Home Exercises Provided -- 05/13/20 05/13/20 05/13/20 05/13/20    Row Name 09/28/20 1600             Response to Exercise   Blood Pressure (Admit) 112/68       Blood Pressure (Exit) 114/72       Heart Rate (Admit) 86 bpm       Heart Rate (Exercise) 93 bpm       Heart Rate (Exit) 88 bpm       Oxygen Saturation (Admit) 93 %       Oxygen Saturation (Exercise) 87 %       Oxygen Saturation (Exit) 94 %       Rating of Perceived Exertion (Exercise) 11       Perceived Dyspnea (Exercise) 0  Symptoms none       Duration Continue with 30 min of aerobic exercise without signs/symptoms of physical distress.       Intensity THRR unchanged               Progression     Progression Continue to progress workloads to maintain intensity without signs/symptoms of physical distress.       Average METs 2.4               Resistance Training     Training Prescription Yes       Weight 2 lb       Reps 10-15               Interval Training     Interval Training No               Oxygen     Oxygen Continuous       Liters 6                NuStep     Level 3       Minutes 30       METs 2.4               Home Exercise Plan     Plans to continue exercise at Home (comment)  bike/crank       Frequency Add 2 additional days to program exercise sessions.       Initial Home Exercises Provided 05/13/20               Exercise Comments:   Exercise Goals and Review:   Exercise Goals Re-Evaluation :  Exercise Goals Re-Evaluation     Row Name 04/29/20 1413 05/12/20 1042 05/13/20 1445 05/26/20 1414 06/08/20 1554     Exercise Goal Re-Evaluation   Exercise Goals Review Increase Physical Activity;Able to understand and use rate of perceived exertion (RPE) scale;Knowledge and understanding of Target Heart Rate Range (THRR);Understanding of Exercise Prescription;Increase Strength and Stamina;Able to understand and use Dyspnea scale;Able to check pulse independently Increase Physical Activity;Increase Strength and Stamina;Understanding of Exercise Prescription Increase Physical Activity;Increase Strength and Stamina -- --   Comments Reviewed RPE and dyspnea scales, THR and program prescription with pt today.  Pt voiced understanding and was given a copy of goals to take home. Kennyth Lose has completed her first two full days of exercise.  She has had some other things come up, including her sugars, that have interferred with her attendance.  We will contiue to monitor her progress and encourage improved attendance. Pt able to follow exercise prescription today without complaint.  Will continue to monitor for progression.  Reviewed home exercise with pt today.  Pt plans to use bike/arm machine at home for exercise.  Reviewed THR, pulse, RPE, sign and symptoms, pulse oximetery and when to call 911 or MD.  Also discussed weather considerations and indoor options.  Pt voiced understanding. Called to check on Jackie.  She has been having a hard time with her feet being swollen and possibly around her lungs as well.  She has started some fluid  medication and to help with swelling.  We talked about keeping feet elevated when not moving.  She has a follow up call tomorrow and possible appt on Friday.  We talked about returning to rehab next week to keep moving and fluid moving. Out since last review   Expected Outcomes Short: Use RPE daily to regulate intensity. Long: Follow program prescription in THR.  Short: Improved reguarly attendance Long: Continue to follow program prescription. Short :  monitor HR and oxygen when exercising at home Long:  improve overall MET level -- --    Row Name 08/05/20 1510 08/17/20 1303 09/01/20 1337 09/15/20 1419 09/28/20 1606     Exercise Goal Re-Evaluation   Exercise Goals Review Increase Physical Activity;Increase Strength and Stamina;Understanding of Exercise Prescription Increase Physical Activity;Increase Strength and Stamina Increase Physical Activity;Increase Strength and Stamina Increase Physical Activity;Increase Strength and Stamina;Understanding of Exercise Prescription Increase Physical Activity;Increase Strength and Stamina;Understanding of Exercise Prescription   Comments Kennyth Lose returned on 5/18.  She desaturated fairly quickly and was encouraged to use PLB.  She stayed on the NuStep for the whole time. We will continue to monitor her progress. Kennyth Lose has only attended 3 times in May.  Consistent attendance is neceassry to see improvements.  Staff will review attendance policy. Jackies oxygen has dropped into mid 80s a couple times during exercise.  Staff will review resting if oxygen goes below 88%. Kennyth Lose is doing well in rehab.  She is using her pedal machine at home.  She also has access to a treadmill and bike.  She has leg and arm weights she has been walking with but hips are sore. We talked about just using those for resistance work.  She has looked at a few vidoes but nothing consistent yet. Kennyth Lose is doing well in rehab.  Her attendance has been better.  She is on level 3 on the NuStep.  We will  continue to monitor her progress.   Expected Outcomes Short: Improved attendance Long: Continue to improve stamina. Short: Improved attendance Long: Continue to improve stamina. Short: attend consistently Long: build stamina and maintain oxygen at or above 88% during exercise Short: Get back to routine again.  Long: Continue to improve stamina. Short: Continue to attend regularly Long; Continue to improve stamina            Discharge Exercise Prescription (Final Exercise Prescription Changes):  Exercise Prescription Changes - 09/28/20 1600       Response to Exercise   Blood Pressure (Admit) 112/68    Blood Pressure (Exit) 114/72    Heart Rate (Admit) 86 bpm    Heart Rate (Exercise) 93 bpm    Heart Rate (Exit) 88 bpm    Oxygen Saturation (Admit) 93 %    Oxygen Saturation (Exercise) 87 %    Oxygen Saturation (Exit) 94 %    Rating of Perceived Exertion (Exercise) 11    Perceived Dyspnea (Exercise) 0    Symptoms none    Duration Continue with 30 min of aerobic exercise without signs/symptoms of physical distress.    Intensity THRR unchanged      Progression   Progression Continue to progress workloads to maintain intensity without signs/symptoms of physical distress.    Average METs 2.4      Resistance Training   Training Prescription Yes    Weight 2 lb    Reps 10-15      Interval Training   Interval Training No      Oxygen   Oxygen Continuous    Liters 6      NuStep   Level 3    Minutes 30    METs 2.4      Home Exercise Plan   Plans to continue exercise at Home (comment)   bike/crank   Frequency Add 2 additional days to program exercise sessions.    Initial Home Exercises Provided 05/13/20  Nutrition:  Target Goals: Understanding of nutrition guidelines, daily intake of sodium <1518m, cholesterol <2068m calories 30% from fat and 7% or less from saturated fats, daily to have 5 or more servings of fruits and vegetables.  Education: All About  Nutrition: -Group instruction provided by verbal, written material, interactive activities, discussions, models, and posters to present general guidelines for heart healthy nutrition including fat, fiber, MyPlate, the role of sodium in heart healthy nutrition, utilization of the nutrition label, and utilization of this knowledge for meal planning. Follow up email sent as well. Written material given at graduation. [facility]  Education need identified 04/13/20  Date 07/28/20  Educator MCLathropInstruction Review Code 1- Verbalizes Understanding       Biometrics:    Nutrition Therapy Plan and Nutrition Goals:  Nutrition Therapy & Goals - 08/25/20 1534       Nutrition Therapy   RD appointment deferred Yes   Seeing outpatient RD - would not like to meet with this RD            Nutrition Assessments:  MEDIFICTS Score Key: ?70 Need to make dietary changes  40-70 Heart Healthy Diet ? 40 Therapeutic Level Cholesterol Diet  Flowsheet Row Pulmonary [facility]  Picture Your Plate Total Score on Admission 54      Picture Your Plate Scores: <4<29nhealthy dietary pattern with much room for improvement. 41-50 Dietary pattern unlikely to meet recommendations for good health and room for improvement. 51-60 More healthful dietary pattern, with some room for improvement.  >60 Healthy dietary pattern, although there may be some specific behaviors that could be improved.   Nutrition Goals Re-Evaluation:  Nutrition Goals Re-Evaluation     Ro[place]ame 08/25/20 1421 09/15/20 1427           Goals   Current Weight 222 lb (100.7 kg) --      Nutrition Goal Talk to dietician at her doctors office. Short: meet with dietician at her doctors. Long: maintain a heart healthy diet.      Comment JaKennyth Loses not intersted in meeting with the dietician in the program but is meeting with one at  her family doctors. JaKennyth Loses doing well with her diet.  She declines meeting with Dietitian in program as she is talking with one in her doctor's office.  She is doing her best to stay away from salt and sugar.  She is watching her fluid and trying to get in enough water.      Expected Outcome Short: meet with dietician at her doctors. Long: maintain a heart healthy diet. Short: Continue to work on heart healthy diet Long: Continue to eat healthy               Nutrition Goals Discharge (Final Nutrition Goals Re-Evaluation):  Nutrition Goals Re-Evaluation - 09/15/20 1427       Goals   Nutrition Goal Short: meet with dietician at her doctors. Long: maintain a heart healthy diet.    Comment JaKennyth Loses doing well with her diet.  She declines meeting with Dietitian in program as she is talking with one in her doctor's office.  She is doing her best to stay away from salt and sugar.  She is watching her fluid and trying to get in enough water.    Expected Outcome Short: Continue to work on heart healthy diet Long: Continue to eat healthy  Psychosocial: Target Goals: Acknowledge presence or absence of significant depression and/or stress, maximize coping skills, provide positive support system. Participant is able to verbalize types and ability to use techniques and skills needed for reducing stress and depression.   Education: Stress, Anxiety, and Depression - Group verbal and visual presentation to define topics covered.  Reviews how body is impacted by stress, anxiety, and depression.  Also discusses healthy ways to reduce stress and to treat/manage anxiety and depression.  Written material given at graduation. Flowsheet Row Pulmonary Rehab from 08/25/2020 in [facility] Cardiac and Pulmonary Rehab  Date 08/25/20  Educator AS  Instruction Review Code 1- Verbalizes Understanding       Education: Sleep Hygiene -Provides group verbal and written instruction about how sleep can affect  your health.  Define sleep hygiene, discuss sleep cycles and impact of sleep habits. Review good sleep hygiene tips.    Initial Review & Psychosocial Screening:   Quality of Life Scores:  Scores of 19 and below usually indicate a poorer quality of life in these areas.  A difference of  2-3 points is a clinically meaningful difference.  A difference of 2-3 points in the total score of the Quality of Life Index has been associated with significant improvement in overall quality of life, self-image, physical symptoms, and general health in studies assessing change in quality of life.  PHQ-9: Recent Review Flowsheet Data     Depression screen [facility] 2/9 09/15/2020 08/25/2020 04/13/2020 03/06/2017   Decreased Interest 0 1 3 0   Down, Depressed, Hopeless 0 0 1 0   PHQ - 2 Score 0 1 4 0   Altered sleeping _0 Tired, decreased energy _1 Change in appetite 0 0 0 2   Feeling bad or failure about yourself  0 0 0 0   Trouble concentrating 0 0 0 0   Moving slowly or fidgety/restless 0 0 0 0   Suicidal thoughts 0 0 0 0   PHQ-9 Score _2 Difficult doing work/chores Somewhat difficult Somewhat difficult Very difficult Somewhat difficult      Interpretation of Total Score  Total Score Depression Severity:  1-4 = Minimal depression, 5-9 = Mild depression, 10-14 = Moderate depression, 15-19 = Moderately severe depression, 20-27 = Severe depression   Psychosocial Evaluation and Intervention:   Psychosocial Re-Evaluation:  Psychosocial Re-Evaluation     Row Name 08/25/20 1426 09/15/20 1422           Psychosocial Re-Evaluation   Current issues with Current Sleep Concerns;Current Psychotropic Meds;History of Depression Current Sleep Concerns;Current Psychotropic Meds;History of Depression      Comments Reviewed patient health questionnaire (PHQ-9) with patient for follow up. Previously, patients score indicated signs/symptoms of depression.  Reviewed to see if patient is improving  symptom wise while in program.  Score improved/declined and patient states that it is because she has been able to do more at home such as gardening and painting. Kennyth Lose has returned today after being out for a few sessions.  She had found her sister dead and has been trying to deal with that emotionally, mentally, and physcially. Otherwise, she is doing pretty well considering.  She is back to sleeping pretty good and uses Tylenol PM to help sleep with her pain. She is feeling pretty stable mood wise and well managed.  Her PHQ is down to 4!!  She is feeling better!      Expected Outcomes  Short: Continue to attend [facility] regularly for regular exercise and social engagement. Long: Continue to improve symptoms and manage a positive mental state. Short: Continue to get back to exercise routine for mental boost Long: Continue to cope with loss of sister      Interventions Encouraged to attend Pulmonary Rehabilitation for the exercise Encouraged to attend Pulmonary Rehabilitation for the exercise      Continue Psychosocial Services  Follow up required by staff Follow up required by staff               Psychosocial Discharge (Final Psychosocial Re-Evaluation):  Psychosocial Re-Evaluation - 09/15/20 1422       Psychosocial Re-Evaluation   Current issues with Current Sleep Concerns;Current Psychotropic Meds;History of Depression    Comments Kennyth Lose has returned today after being out for a few sessions.  She had found her sister dead and has been trying to deal with that emotionally, mentally, and physcially. Otherwise, she is doing pretty well considering.  She is back to sleeping pretty good and uses Tylenol PM to help sleep with her pain. She is feeling pretty stable mood wise and well managed.  Her PHQ is down to 4!!  She is feeling better!    Expected Outcomes Short: Continue to get back to exercise routine for mental boost Long: Continue to cope with loss of sister    Interventions Encouraged to  attend Pulmonary Rehabilitation for the exercise    Continue Psychosocial Services  Follow up required by staff             Education: Education Goals: Education classes will be provided on a weekly basis, covering required topics. Participant will state understanding/return demonstration of topics presented.  Learning Barriers/Preferences:   General Pulmonary Education Topics:  Infection Prevention: - Provides verbal and written material to individual with discussion of infection control including proper hand washing and proper equipment cleaning during exercise session. Flowsheet Row Pulmonary Rehab from 08/25/2020 in [facility] Cardiac and Pulmonary Rehab  Date 03/30/20  Educator [facility]  Instruction Review Code 1- Verbalizes Understanding       Falls Prevention: - Provides verbal and written material to individual with discussion of falls prevention and safety. Flowsheet Row Pulmonary Rehab from 08/25/2020 in [facility] Cardiac and Pulmonary Rehab  Date 03/30/20  Educator [facility]  Instruction Review Code 1- Verbalizes Understanding       Chronic Lung Disease Review: - Group verbal instruction with posters, models, PowerPoint presentations and videos,  to review new updates, new respiratory medications, new advancements in procedures and treatments. Providing information on websites and "800" numbers for continued self-education. Includes information about supplement oxygen, available portable oxygen systems, continuous and intermittent flow rates, oxygen safety, concentrators, and Medicare reimbursement for oxygen. Explanation of Pulmonary Drugs, including class, frequency, complications, importance of spacers, rinsing mouth after steroid MDI's, and proper cleaning methods for nebulizers. Review of basic lung anatomy and physiology related to function, structure, and complications of lung disease. Review of risk factors. Discussion about methods for diagnosing sleep apnea and types of masks and  machines for OSA. Includes a review of the use of types of environmental controls: home humidity, furnaces, filters, dust mite/pet prevention, HEPA vacuums. Discussion about weather changes, air quality and the benefits of nasal washing. Instruction on Warning signs, infection symptoms, calling MD promptly, preventive modes, and value of vaccinations. Review of effective airway clearance, coughing and/or vibration techniques. Emphasizing that all should Create an Action Plan. Written material given at graduation. Flowsheet Row Pulmonary Rehab  from 08/25/2020 in [facility] Cardiac and Pulmonary Rehab  Education need identified 04/13/20       AED/CPR: - Group verbal and written instruction with the use of models to demonstrate the basic use of the AED with the basic ABC's of resuscitation.    Anatomy and Cardiac Procedures: - Group verbal and visual presentation and models provide information about basic cardiac anatomy and function. Reviews the testing methods done to diagnose heart disease and the outcomes of the test results. Describes the treatment choices: Medical Management, Angioplasty, or Coronary Bypass Surgery for treating various heart conditions including Myocardial Infarction, Angina, Valve Disease, and Cardiac Arrhythmias.  Written material given at graduation. Flowsheet Row Pulmonary Rehab from 08/25/2020 in [facility] Cardiac and Pulmonary Rehab  Date 05/12/20  Educator [facility]  Instruction Review Code 1- Verbalizes Understanding       Medication Safety: - Group verbal and visual instruction to review commonly prescribed medications for heart and lung disease. Reviews the medication, class of the drug, and side effects. Includes the steps to properly store meds and maintain the prescription regimen.  Written material given at graduation.   Other: -Provides group and verbal instruction on various topics (see comments)   Knowledge Questionnaire Score:    Core Components/Risk Factors/Patient  Goals at Admission:   Education:Diabetes - Individual verbal and written instruction to review signs/symptoms of diabetes, desired ranges of glucose level fasting, after meals and with exercise. Acknowledge that pre and post exercise glucose checks will be done for 3 sessions at entry of program. Flowsheet Row Pulmonary Rehab from 08/25/2020 in [facility] Cardiac and Pulmonary Rehab  Date 04/13/20  Educator [facility]  Instruction Review Code 1- Verbalizes Understanding       Know Your Numbers and Heart Failure: - Group verbal and visual instruction to discuss disease risk factors for cardiac and pulmonary disease and treatment options.  Reviews associated critical values for Overweight/Obesity, Hypertension, Cholesterol, and Diabetes.  Discusses basics of heart failure: signs/symptoms and treatments.  Introduces Heart Failure Zone chart for action plan for heart failure.  Written material given at graduation.   Core Components/Risk Factors/Patient Goals Review:   Goals and Risk Factor Review     Row Name 08/25/20 1421 09/15/20 1429           Core Components/Risk Factors/Patient Goals Review   Personal Goals Review Improve shortness of breath with ADL's Improve shortness of breath with ADL's;Weight Management/Obesity;Diabetes;Hypertension      Review Spoke to patient about their shortness of breath and what they can do to improve. Patient has been informed of breathing techniques when starting the program. Patient is informed to tell staff if they have had any med changes and that certain meds they are taking or not taking can be causing shortness of breath. Kennyth Lose is doing well in rehab.  Her sugars are doing well and she checks them pretty routinely at home (around 6x a day).  Her weight is holding steady and she's glad that she is not gaining anything.  Her pressures have been doing well and she continues to check them at home too.  Her breathing is getting better, but she still has days where it gets  worse.  She has noted that exercise days it does better.      Expected Outcomes Short: Attend [facility] regularly to improve shortness of breath with ADL's. Long: maintain independence with ADL's Short: Continue to work on breathing Long: Continue to monitor risk factors.  Core Components/Risk Factors/Patient Goals at Discharge (Final Review):   Goals and Risk Factor Review - 09/15/20 1429       Core Components/Risk Factors/Patient Goals Review   Personal Goals Review Improve shortness of breath with ADL's;Weight Management/Obesity;Diabetes;Hypertension    Review Kennyth Lose is doing well in rehab.  Her sugars are doing well and she checks them pretty routinely at home (around 6x a day).  Her weight is holding steady and she's glad that she is not gaining anything.  Her pressures have been doing well and she continues to check them at home too.  Her breathing is getting better, but she still has days where it gets worse.  She has noted that exercise days it does better.    Expected Outcomes Short: Continue to work on breathing Long: Continue to monitor risk factors.             ITP Comments:  ITP Comments     Row Name 04/29/20 1411 05/12/20 0615 05/26/20 1413 06/08/20 1553 06/09/20 0721   ITP Comments First full day of exercise!  Patient was oriented to gym and equipment including functions, settings, policies, and procedures.  Patient's individual exercise prescription and treatment plan were reviewed.  All starting workloads were established based on the results of the 6 minute walk test done at initial orientation visit.  The plan for exercise progression was also introduced and progression will be customized based on patient's performance and goals. 30 Day review completed. Medical Director ITP review done, changes made as directed, and signed approval by Medical Director. Called to check on Jackie.  She has been having a hard time with her feet being swollen and possibly  around her lungs as well.  She has started some fluid medication and to help with swelling.  We talked about keeping feet elevated when not moving.  She has a follow up call tomorrow and possible appt on Friday.  We talked about returning to rehab next week to keep moving and fluid moving. Called to check on pt.  Out since 05/13/20.  Left message. 30 Day review completed. Medical Director ITP review done, changes made as directed, and signed approval by Medical Director.    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  Her f/u is scheduled for 06/24/20, will need clearance to return. Patient has not been to rehba since last review. - Toe amputated 3/30, f/u was sch for 4/7. Have not heard back regarding follow up or clearance. 30 Day review completed. Medical Director ITP review done, changes made as directed, and signed approval by Medical Director. Kennyth Lose has not attended since last review Kennyth Lose just came back to rehab 5/11; previously has not attended since February. She cancelled the RD call 5/16 and did not come to class 5/16 with no call. Unable to complete goals.    [place] Name 08/04/20 0631 09/01/20 0701 09/29/20 0630 10/04/20 1352 10/19/20 0855   ITP Comments 30 Day review completed. Medical Director ITP review done, changes made as directed, and signed approval by Medical Director. 30 Day review completed. Medical Director ITP review done, changes made as directed, and signed approval by Medical Director. 30 Day review completed. Medical Director ITP review done, changes made as directed, and signed approval by Medical Director. Ms. Hypolite called to ask about taking an additional diuretic pill because her feet and ankles are swollen and increase in weight per the patient  of 3-4 pounds. She advised that she is prescribed to take one a day and had already taken today's dose without relief. She stated that she is drinking  water, but not excessive amounts. This nurse advised the patient to contact her prescribing physician following our phone call about her concerns of weight increase and fluid retention. Patient was also advised should she develop difficulty breathing to call 911. Patient stated understanding and that she would be contacting her doctor once our call was complete. Unable to reach patient after multiple attempts. Discharge date was 7/29 due to lack of attendance and not abiding by rehab's attendance policy. This was already discussed with the patient and should be aware. Left message to remind patient. Discharge at this time.            Comments: Discharge ITP

## 2020-10-22 MED ORDER — INSULIN ASPART U-100  100 UNIT/ML SUBCUTANEOUS SOLUTION
1 refills | 0 days | Status: CP
Start: 2020-10-22 — End: 2021-10-22

## 2020-10-26 NOTE — Unmapped (Signed)
[facility] Refill Coordination Note    Specialty Medication(s) to be Shipped:   CF/Pulmonary: -Nucala 100mg /ml Inj  Other medication(s) to be shipped: No additional medications requested for fill at this time     Karen Barber, DOB: 15-Nov-1965  Phone: [phone] (home) [phone] (work)    All above HIPAA information was verified with patient.     Was a Nurse, learning disability used for this call? No    Completed refill call assessment today to schedule patient's medication shipment from the [facility] [phone]).  All relevant notes have been reviewed.     Specialty medication(s) and dose(s) confirmed: Regimen is correct and unchanged.   Changes to medications: Karen Barber reports no changes at this time.  Changes to insurance: No  New side effects reported not previously addressed with a pharmacist or physician: None reported  Questions for the pharmacist: No    Confirmed patient received a Conservation officer, historic buildings and a Surveyor, mining with first shipment. The patient will receive a drug information handout for each medication shipped and additional FDA Medication Guides as required.       DISEASE/MEDICATION-SPECIFIC INFORMATION        For CF patients: CF [facility] Active? No-not enrolled    SPECIALTY MEDICATION ADHERENCE     Medication Adherence    Patient reported X missed doses in the last month: 0  Specialty Medication: Nucala 100mg /ml Inj  Patient is on additional specialty medications: No  Patient is on more than two specialty medications: No  Informant: patient  Reliability of informant: reliable  Reasons for non-adherence: no problems identified  Confirmed plan for next specialty medication refill: delivery by pharmacy  Refills needed for supportive medications: not needed      Were doses missed due to medication being on hold? No    Nucala 100mg /ml Inj: 0 days of medicine on hand (Next Injection: 11/14/2020)    REFERRAL TO PHARMACIST     Referral to the pharmacist: Not needed    [facility]     Shipping address confirmed in Epic.     Delivery Scheduled: Yes, Expected medication delivery date: 11/09/2020.     Medication will be delivered via Same Day Courier to the prescription address in Epic WAM.    Karen Barber   [facility] Specialty Technician

## 2020-11-02 ENCOUNTER — Ambulatory Visit: Admit: 2020-11-02 | Discharge: 2020-11-03 | Payer: MEDICARE | Attending: Family | Primary: Family

## 2020-11-02 DIAGNOSIS — J449 Chronic obstructive pulmonary disease, unspecified: Principal | ICD-10-CM

## 2020-11-02 DIAGNOSIS — Z7689 Persons encountering health services in other specified circumstances: Principal | ICD-10-CM

## 2020-11-02 DIAGNOSIS — Z1212 Encounter for screening for malignant neoplasm of rectum: Principal | ICD-10-CM

## 2020-11-02 DIAGNOSIS — E11621 Type 2 diabetes mellitus with foot ulcer: Principal | ICD-10-CM

## 2020-11-02 DIAGNOSIS — J9611 Chronic respiratory failure with hypoxia: Principal | ICD-10-CM

## 2020-11-02 DIAGNOSIS — R600 Localized edema: Principal | ICD-10-CM

## 2020-11-02 DIAGNOSIS — L97511 Non-pressure chronic ulcer of other part of right foot limited to breakdown of skin: Principal | ICD-10-CM

## 2020-11-02 DIAGNOSIS — Z1211 Encounter for screening for malignant neoplasm of colon: Principal | ICD-10-CM

## 2020-11-02 NOTE — Unmapped (Signed)
Assessment and Plan:     Karen Barber was seen today for facial puncture wound complex and mobility evaluation.    Diagnoses and all orders for this visit:    Encounter for power mobility device assessment  Order to be faxed for PT/OT Mobility evaluation.   Plan to review PT evaluation notes once completed and for patient to return for face-to-face to discuss. Anticipate generating order for power wheelchair at that time.    Chronic obstructive pulmonary disease, unspecified COPD type (CMS-HCC)    Chronic respiratory failure with hypoxia (CMS-HCC)    Diabetic ulcer of toe of right foot associated with type 2 diabetes mellitus, limited to breakdown of skin (CMS-HCC)    Localized edema  Encouraged patient to take furosemide 20 mg BID consistently as prescribed. Has follow up with specialist next week.     Screening for colorectal cancer  -     Colorectal Cancer DNA + FIT    I personally spent 35 minutes face-to-face and non-face-to-face in the care of this patient, which includes all pre, intra, and post visit time on the date of service.    Return in about 3 months (around 02/02/2021) for Next scheduled follow up.    HPI:      Karen Barber  is here for   Chief Complaint   Patient presents with   ??? Mobility evaluation     Face-to-face for mobility evaluation for power mobility device.     Patient presents for evaluation for power wheelchair. Primary reason for need for power wheelchair is for DOE (PMH of COPD, chronic pulmonary aspergillosis, chronic respiratory failure, pulmonary sarcoidosis). 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 She is currently on portable oxygen, but explains SpO2 drops to 70% with minimal exertion (with use of supplemental oxygen). She states she is unable to go grocery shopping due dyspnea. She explains electric carts rarely being available, as there is limited supply and carts are usually already taken by other customers. She was previously using rolling walker, which she reports is currently broken. However, even with use of rolling walker, she had to stop frequently to rest due to dyspnea.     She reports bilateral pedal edema. 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 Current treatment: furosemide 20 mg BID prn, however she states rx bottle reads BID everyday.        ROS:      Comprehensive 10 point ROS negative unless otherwise stated in the HPI.      [facility] Components:     Medication adherence and barriers to the treatment plan have been addressed. Opportunities to optimize healthy behaviors have been discussed. Patient / caregiver voiced understanding.    Past Medical/Surgical History:     Past Medical History:   Diagnosis Date   ??? 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 Breast injury     lung surg on left incision under breast sept 2016   ??? Caregiver burden     parents    ??? Hepatitis C antibody test positive 2016    repeatedly negative HCV RNA, indicating clearance of infection w/o treatment   ??? Hyperlipidemia    ??? Hypertension    ??? 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 On home O2 2008    per Dr  Dover's note from 02/21/2017   ??? Pulmonary aspergillosis (CMS-HCC) 2016    A.fumigatus in 2016, prior to LUL lobectomy. A.niger from sputum 08/2016-09/2016.   ??? 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 Sarcoidosis 1995   ??? Type 2 diabetes mellitus with hyperglycemia (CMS-HCC) 07/27/2014   ??? Visual impairment     glasses     Past Surgical History:   Procedure Laterality Date   ??? LUNG LOBECTOMY     ??? PR AMPUTATION TOE,MT-P JT Right 12/12/2019    Procedure: Right foot fifth toe amputation at the metatarsophalangeal joint level;  Surgeon: Britt Bottom, DPM;  Location: MAIN OR [facility];  Service: Vascular   ??? PR AMPUTATION TOE,MT-P JT Right 06/16/2020 Procedure: Right foot amputation of 3rd toe at mpj level;  Surgeon: Britt Bottom, DPM;  Location: MAIN OR [facility];  Service: Vascular   ??? PR RIGHT HEART CATH O2 SATURATION & CARDIAC OUTPUT N/A 04/29/2019    Procedure: Right Heart Catheterization;  Surgeon: Rosana Hoes, MD;  Location: [facility] CATH;  Service: Cardiology   ??? PR THORACOSCOPY SURG TOT PULM DECORT Left 12/14/2014    Procedure: THORACOSCOPY SURG; W/TOT PULM DECORTIC/PNEUMOLYS;  Surgeon: Evert Kohl, MD;  Location: MAIN OR [facility];  Service: Cardiothoracic   ??? PR THORACOSCOPY W/THERA WEDGE RESEXN INITIAL UNILAT Left 12/03/2014    Procedure: THORACOSCOPY, SURGICAL; WITH THERAPEUTIC WEDGE RESECTION (EG, MASS, NODULE) INITIAL UNILATERAL;  Surgeon: Evert Kohl, MD;  Location: MAIN OR [facility];  Service: Cardiothoracic       Family History:     Family History   Problem Relation Age of Onset   ??? Diabetes Father    ??? Diabetes Brother    ??? Diabetes Maternal Aunt    ??? Sarcoidosis Maternal Aunt    ??? Diabetes Maternal Uncle    ??? Diabetes Paternal Aunt    ??? Diabetes Paternal Uncle        Social History:     Social History     Tobacco Use   ??? Smoking status: Former Smoker     Packs/day: 0.50     Years: 9.00     Pack years: 4.50     Types: Cigarettes     Quit date: 05/03/1987     Years since quitting: 33.5   ??? Smokeless tobacco: Never Used   Vaping Use   ??? Vaping Use: Never used   Substance Use Topics   ??? Alcohol use: No     Alcohol/week: 0.0 standard drinks   ??? Drug use: No       Allergies:     Patient has no known allergies.    Current Medications:     Current Outpatient Medications   Medication Sig Dispense Refill   ??? ACCU-CHEK FASTCLIX LANCET DRUM Misc USE TO CHECK GLUCOSE 4 TIMES DAILY BEFORE MEAL(S) AND NIGHTLY     ??? ACCU-CHEK GUIDE GLUCOSE METER Misc 1 Device by Other route Four (4) times a day (before meals and nightly). AS DIRECTED 1 kit 0   ??? acetaminophen (TYLENOL) 325 MG tablet Take 2 tablets (650 mg total) by mouth every four (4) hours as needed. 30 tablet 1   ??? albuterol 2.5 mg /3 mL (0.083 %) nebulizer solution Inhale 3 mL (2.5 mg total) by nebulization every six (6) hours as needed for wheezing or shortness of breath (airway clearance/cough). 120 mL 2   ??? albuterol HFA 90 mcg/actuation inhaler Inhale 2 puffs every four (4) hours as needed for wheezing or  shortness of breath. 24 g 1   ??? amLODIPine (NORVASC) 5 MG tablet Take 5 mg by mouth daily. Added per patient     ??? aspirin 325 MG tablet Take 325 mg by mouth daily.     ??? blood sugar diagnostic Strp by Other route Three (3) times a day. ACCU-CHEK Guide meter 300 strip 1   ??? budesonide-formoteroL (SYMBICORT) 160-4.5 mcg/actuation inhaler Inhale 2 puffs Two (2) times a day. 10.2 g 3   ??? cyclobenzaprine (FLEXERIL) 10 MG tablet Take 1 tablet by mouth three times daily as needed 90 tablet 0   ??? furosemide (LASIX) 20 MG tablet Take 20 mg by mouth daily as needed.     ??? gabapentin (NEURONTIN) 300 MG capsule Take 1 capsule (300 mg total) by mouth Three (3) times a day. 270 capsule 1   ??? insulin ASPART (NOVOLOG U-100 INSULIN ASPART) 100 unit/mL injection Take 1-20 units SQ QID AC and HS as directed 20 mL 1   ??? insulin glargine (BASAGLAR, LANTUS) 100 unit/mL (3 mL) injection pen Inject 0.24 mL (24 Units total) under the skin nightly. 21 mL 1   ??? ipratropium (ATROVENT) 0.02 % nebulizer solution Inhale 2.5 mL (500 mcg total) by nebulization Four (4) times a day. (Patient taking differently: Inhale 500 mcg by nebulization Two (2) times a day.) 900 mL 1   ??? mepolizumab (NUCALA) 100 mg/mL AtIn Inject the contents of 1 syringe (100 mg) under the skin every twenty-eight (28) days. 1 mL 12   ??? miscellaneous medical supply Misc Nebulizer machine and tubing. Patients machine is 57 years old. 1 each 0   ??? OXYGEN-AIR DELIVERY SYSTEMS MISC Dose: 2-3 LPM     ??? posaconazole (NOXAFIL) 100 mg delayed released tablet Take 2 tablets (200 mg total) by mouth in the morning. 60 tablet 2   ??? sildenafiL, pulm.hypertension, (REVATIO) 20 mg tablet Take 1 tablet (20 mg total) by mouth Three (3) times a day. 270 tablet 1   ??? treprostiniL (TYVASO) 1.74 mg/2.9 mL (0.6 mg/mL) Nebu Inhale 18 mcg four (4) times a day.      ??? empty container Misc USE AS DIRECTED (Patient not taking: Reported on 11/02/2020) 1 each 2   ??? ketoconazole (NIZORAL) 2 % cream Apply 1 application topically Two (2) times a day. 60 g 5   ??? MEDICAL SUPPLY ITEM Accucheck Glide Glocumeter for home glucose checks. Check blood glucose ACHS. (Patient not taking: Reported on 11/02/2020) 1 Package 0   ??? metFORMIN (GLUCOPHAGE) 1000 MG tablet Take 1 tablet (1,000 mg total) by mouth 2 (two) times a day with meals. 180 tablet 1   ??? pravastatin (PRAVACHOL) 40 MG tablet Take 1 tablet (40 mg total) by mouth daily with evening meal. (Patient not taking: Reported on 11/02/2020) 90 tablet 1   ??? predniSONE (DELTASONE) 5 MG tablet Take 5 mg by mouth in the morning.       No current facility-administered medications for this visit.       Health Maintenance:     Health Maintenance   Topic Date Due   ??? HPV Cotest with Pap Smear (21-65)  Never done   ??? Pap Smear with Cotest HPV (21-65)  02/27/2001   ??? FIT-DNA Stool Test  Never done   ??? Zoster Vaccines (1 of 2) Never done   ??? DTaP/Tdap/Td Vaccines (2 - Td or Tdap) 07/07/2017   ??? Pneumococcal Vaccine 0-64 (2 - PCV) 01/03/2018   ??? Retinal Eye Exam  11/15/2019   ???  Mammogram Start Age 18  02/29/2020   ??? COVID-19 Vaccine (4 - Booster for Pfizer series) 03/22/2020   ??? Hemoglobin A1c  08/27/2020   ??? Influenza Vaccine (1) 11/18/2020   ??? Foot Exam  01/18/2021   ??? Urine Albumin/Creatinine Ratio  01/18/2021   ??? Serum Creatinine Monitoring  06/08/2021   ??? Potassium Monitoring  06/08/2021   ??? COPD Spirometry  01/04/2022   ??? Hepatitis C Screen  Completed       Immunizations:     Immunization History   Administered Date(s) Administered   ??? COVID-19 VACC,MRNA,(PFIZER)(PF)(IM) 06/11/2019, 07/02/2019, 11/21/2019   ??? INFLUENZA TIV (TRI) PF (IM) 01/02/2005, 12/14/2008 ??? Influenza Vaccine Quad (IIV4 PF) 79mo+ injectable 11/28/2012, 12/19/2014, 12/16/2015, 01/03/2017, 12/06/2017, 01/08/2019, 12/15/2019   ??? PNEUMOCOCCAL POLYSACCHARIDE 23 07/08/2007, 01/03/2017   ??? PPD Test 01/19/2015   ??? TdaP 07/08/2007     I have reviewed and (if needed) updated the patient's problem list, medications, allergies, past medical and surgical history, social and family history.     Vital Signs:     Wt Readings from Last 3 Encounters:   11/02/20 98 kg (216 lb)   07/20/20 97.1 kg (214 lb)   06/08/20 97.1 kg (214 lb)     Temp Readings from Last 3 Encounters:   11/02/20 37.1 ??C (98.8 ??F) (Oral)   07/20/20 36.8 ??C (98.2 ??F) (Temporal)   06/24/20 36.4 ??C (97.6 ??F) (Temporal)     BP Readings from Last 3 Encounters:   11/02/20 110/76   07/20/20 118/70   06/24/20 127/74     Pulse Readings from Last 3 Encounters:   11/02/20 87   07/20/20 83   06/24/20 87     Estimated body mass index is 30.13 kg/m?? as calculated from the following:    Height as of this encounter: 180.3 cm (5' 11).    Weight as of this encounter: 98 kg (216 lb).  Facility age limit for growth percentiles is 20 years.      Objective:      General: Alert and oriented x3. Well-appearing. No acute distress.   HEENT:  Normocephalic.  Atraumatic. Conjunctiva and sclera normal. OP MMM without lesions.   Neck:  Supple. No thyroid enlargement. No adenopathy.   Heart:  Regular rate and rhythm. Normal S1, S2. No murmurs, rubs or gallops.   Lungs:  No respiratory distress.  Lungs clear to auscultation. No wheezes, rhonchi, or rales. Oxygen in use.  GI/GU:  Soft, +BS, nondistended, non-TTP. No palpable masses or organomegaly.   Extremities: Peripheral pulses normal. Mild pedal edema bilateral.  Skin:  Warm, dry. No rash or lesions present.   Neuro:  Non-focal. No obvious weakness.   Psych:  Affect normal, eye contact good, speech clear and coherent.       I attest that I, Bayard Hugger, personally documented this note while acting as scribe for Noralyn Pick, FNP.      Bayard Hugger, Scribe.  11/02/2020     The documentation recorded by the scribe accurately reflects the service I personally performed and the decisions made by me.    Noralyn Pick, FNP

## 2020-11-09 MED FILL — NUCALA 100 MG/ML SUBCUTANEOUS AUTO-INJECTOR: SUBCUTANEOUS | 28 days supply | Qty: 1 | Fill #5

## 2020-12-10 DIAGNOSIS — J449 Chronic obstructive pulmonary disease, unspecified: Principal | ICD-10-CM

## 2020-12-10 DIAGNOSIS — D869 Sarcoidosis, unspecified: Principal | ICD-10-CM

## 2020-12-10 DIAGNOSIS — B441 Other pulmonary aspergillosis: Principal | ICD-10-CM

## 2020-12-10 MED ORDER — ALBUTEROL SULFATE 2.5 MG/3 ML (0.083 %) SOLUTION FOR NEBULIZATION
Freq: Four times a day (QID) | RESPIRATORY_TRACT | 2 refills | 10 days | Status: CP | PRN
Start: 2020-12-10 — End: 2021-12-10

## 2020-12-10 MED ORDER — POSACONAZOLE 100 MG TABLET,DELAYED RELEASE
ORAL_TABLET | Freq: Every day | ORAL | 2 refills | 30 days
Start: 2020-12-10 — End: ?

## 2020-12-10 MED ORDER — GABAPENTIN 300 MG CAPSULE
ORAL_CAPSULE | Freq: Three times a day (TID) | ORAL | 1 refills | 90 days | Status: CP
Start: 2020-12-10 — End: 2021-12-11

## 2020-12-10 MED ORDER — ALBUTEROL SULFATE HFA 90 MCG/ACTUATION AEROSOL INHALER
RESPIRATORY_TRACT | 1 refills | 0 days | Status: CP | PRN
Start: 2020-12-10 — End: 2021-12-10

## 2020-12-10 NOTE — Unmapped (Signed)
Patient is requesting the following refill  Requested Prescriptions     Pending Prescriptions Disp Refills   ??? gabapentin (NEURONTIN) 300 MG capsule 270 capsule 1     Sig: Take 1 capsule (300 mg total) by mouth Three (3) times a day.   ??? albuterol 2.5 mg /3 mL (0.083 %) nebulizer solution 120 mL 2     Sig: Inhale 3 mL (2.5 mg total) by nebulization every six (6) hours as needed for wheezing or shortness of breath (airway clearance/cough).   ??? albuterol HFA 90 mcg/actuation inhaler 24 g 1     Sig: Inhale 2 puffs every four (4) hours as needed for wheezing or shortness of breath.       Recent Visits  Date Type Provider Dept   11/02/20 Office Visit Keri Rosita Fire, FNP [facility]   01/19/20 Office Visit Keri Rosita Fire, FNP [facility]   Showing recent visits within past 365 days with a meds authorizing provider and meeting all other requirements  Future Appointments  No visits were found meeting these conditions.  Showing future appointments within next 365 days with a meds authorizing provider and meeting all other requirements

## 2020-12-10 NOTE — Unmapped (Signed)
[facility] Refill Coordination Note    Specialty Medication(s) to be Shipped:   CF/Pulmonary: -Nucala 100mg /ml  Other medication(s) to be shipped: No additional medications requested for fill at this time     Karen Barber, DOB: 1965/08/13  Phone: [phone] (home) [phone] (work)    All above HIPAA information was verified with patient.     Was a Nurse, learning disability used for this call? No    Completed refill call assessment today to schedule patient's medication shipment from the [facility] [phone]).  All relevant notes have been reviewed.     Specialty medication(s) and dose(s) confirmed: Regimen is correct and unchanged.   Changes to medications: [name] reports no changes at this time.  Changes to insurance: No  New side effects reported not previously addressed with a pharmacist or physician: None reported  Questions for the pharmacist: No    Confirmed patient received a Conservation officer, historic buildings and a Surveyor, mining with first shipment. The patient will receive a drug information handout for each medication shipped and additional FDA Medication Guides as required.       DISEASE/MEDICATION-SPECIFIC INFORMATION        For CF patients: CF [employer] Active? No-not enrolled    SPECIALTY MEDICATION ADHERENCE     Medication Adherence    Patient reported X missed doses in the last month: 0  Specialty Medication: Nucala 100mg /ml  Patient is on additional specialty medications: No  Patient is on more than two specialty medications: No  Informant: patient  Reliability of informant: reliable  Reasons for non-adherence: no problems identified  Confirmed plan for next specialty medication refill: delivery by pharmacy  Refills needed for supportive medications: not needed      Were doses missed due to medication being on hold? No    Nucala 100mg /ml: 0 days of medicine on hand (Next Injection: 12/15/20)    REFERRAL TO PHARMACIST     Referral to the pharmacist: Not needed    [facility]     Shipping address confirmed in Epic.     Delivery Scheduled: Yes, Expected medication delivery date: 12/14/2020.     Medication will be delivered via Same Day Courier to the prescription address in Epic WAM.    Uyen Eichholz P Allena Katz   [facility] Specialty Technician

## 2020-12-11 MED ORDER — POSACONAZOLE 100 MG TABLET,DELAYED RELEASE
ORAL_TABLET | Freq: Every day | ORAL | 2 refills | 30 days | Status: CP
Start: 2020-12-11 — End: ?

## 2020-12-14 MED FILL — NUCALA 100 MG/ML SUBCUTANEOUS AUTO-INJECTOR: SUBCUTANEOUS | 28 days supply | Qty: 1 | Fill #6

## 2020-12-31 NOTE — Unmapped (Signed)
[facility] Refill Coordination Note    Specialty Medication(s) to be Shipped:   CF/Pulmonary: -Nucala 100mg /ml Inj  Other medication(s) to be shipped: No additional medications requested for fill at this time     Karen Barber, DOB: 03/18/1966  Phone: [phone] (home) [phone] (work)    All above HIPAA information was verified with patient.     Was a Nurse, learning disability used for this call? No    Completed refill call assessment today to schedule patient's medication shipment from the [facility] [phone]).  All relevant notes have been reviewed.     Specialty medication(s) and dose(s) confirmed: Regimen is correct and unchanged.   Changes to medications: [name] reports no changes at this time.  Changes to insurance: No  New side effects reported not previously addressed with a pharmacist or physician: None reported  Questions for the pharmacist: No    Confirmed patient received a Conservation officer, historic buildings and a Surveyor, mining with first shipment. The patient will receive a drug information handout for each medication shipped and additional FDA Medication Guides as required.       DISEASE/MEDICATION-SPECIFIC INFORMATION        For CF patients: CF [employer] Active? No-not enrolled    SPECIALTY MEDICATION ADHERENCE     Medication Adherence    Patient reported X missed doses in the last month: 0  Specialty Medication: Nucala 100mg /ml Inj  Patient is on additional specialty medications: No  Patient is on more than two specialty medications: No  Informant: patient  Reliability of informant: reliable  Reasons for non-adherence: no problems identified  Confirmed plan for next specialty medication refill: delivery by pharmacy  Refills needed for supportive medications: not needed        Were doses missed due to medication being on hold? No    Nucala 100mg /ml Inj  : 0 days of medicine on hand (Next Injection: 01/14/21)    REFERRAL TO PHARMACIST     Referral to the pharmacist: Not needed    [facility]     Shipping address confirmed in Epic.     Delivery Scheduled: Yes, Expected medication delivery date: 01/11/2021.     Medication will be delivered via Same Day Courier to the prescription address in Epic WAM.    Brannon Levene P Allena Katz   [facility] Specialty Technician

## 2021-01-07 NOTE — Unmapped (Signed)
Left message did we get [facility]?

## 2021-01-07 NOTE — Unmapped (Signed)
Left message we faxed 12/31/20 with confirmation it went through.  Re-faxed and Karen Barber aware.

## 2021-01-11 DIAGNOSIS — E119 Type 2 diabetes mellitus without complications: Principal | ICD-10-CM

## 2021-01-11 MED FILL — NUCALA 100 MG/ML SUBCUTANEOUS AUTO-INJECTOR: SUBCUTANEOUS | 28 days supply | Qty: 1 | Fill #7

## 2021-01-11 NOTE — Unmapped (Signed)
Error

## 2021-01-11 NOTE — Unmapped (Signed)
Patient scheduled for a lab visit on 01/14/21.

## 2021-01-19 ENCOUNTER — Other Ambulatory Visit: Admit: 2021-01-19 | Discharge: 2021-01-20 | Payer: MEDICARE

## 2021-01-19 DIAGNOSIS — Z794 Long term (current) use of insulin: Principal | ICD-10-CM

## 2021-01-19 DIAGNOSIS — E1165 Type 2 diabetes mellitus with hyperglycemia: Principal | ICD-10-CM

## 2021-02-01 MED ORDER — PREDNISONE 5 MG TABLET
ORAL_TABLET | Freq: Every day | ORAL | 3 refills | 30.00000 days
Start: 2021-02-01 — End: ?

## 2021-02-02 NOTE — Unmapped (Signed)
Left message to call back and let us know what they need.

## 2021-02-02 NOTE — Unmapped (Signed)
Bos with [facility] [phone] ext [phone].  Left message to call.

## 2021-02-14 ENCOUNTER — Ambulatory Visit: Admit: 2021-02-14 | Payer: MEDICARE | Attending: Family | Primary: Family

## 2021-02-15 NOTE — Unmapped (Signed)
No response

## 2021-02-15 NOTE — Unmapped (Signed)
The [facility] has made a third and final attempt to reach this patient to refill the following medication:  Nucala 100mg /ml.      We have left voicemails on the following phone numbers: [phone] & [phone] and have sent a MyChart message.    Dates contacted: 11/11, 11/18 & 11/29    Last scheduled delivery: 01/11/2021    The patient may be at risk of non-compliance with this medication. The patient should call the [facility] at [phone]  Option 4, then Option 2 (all other specialty patients) to refill medication.    Antoine Fiallos Leodis Binet   [facility] Specialty Technician

## 2021-02-22 NOTE — Unmapped (Unsigned)
[facility] at [facility] Note:  [place], [place] [address]. Phone [phone]    02/22/2021    Patient Name:   Karen Barber    MRN: [id number]    Demographics:    Age-  55 y.o.     Date of Birth-  13-Jan-1966    Chief complaint (CC):  No chief complaint on file.      Assessment/Plan:    Karen Barber is a pleasant 55 y.o. female with Hx of Type 2 diabetes mellitus with hyperglycemia. She has concerns of recent Barber elevation.     {No diagnosis found. (Refresh or delete this SmartLink)}    30 minutes of clinical time > 1/2 the office visit was face to face time. We discussed medical, dietary, lifestyle, and health maintenance modifications to optimize health. Due to COVID-19, masks worn and standard precautions followed during visit. Medication adherence and barriers to the treatment plan have been addressed.  Patient voiced understanding, needs F/U visit as scheduled.    Health Maintenance:   Health Maintenance Due   Topic Date Due   ??? HPV Cotest with Pap Smear (21-65)  Never done   ??? Pap Smear with Cotest HPV (21-65)  02/27/2001   ??? FIT-DNA Stool Test  Never done   ??? Zoster Vaccines (1 of 2) Never done   ??? DTaP/Tdap/Td Vaccines (2 - Td or Tdap) 07/07/2017   ??? Pneumococcal Vaccine 0-64 (2 - PCV) 01/03/2018   ??? Retinal Eye Exam  11/15/2019   ??? COVID-19 Vaccine (4 - Booster for Pfizer series) 01/16/2020   ??? Mammogram Start Age 45  02/29/2020   ??? Foot Exam  01/18/2021   ??? Urine Albumin/Creatinine Ratio  01/18/2021     PHQ-2 Score:      PHQ-9 Score:      Inocente Salles Score:      {select_status_or_delete_smartlist:64641}    Subjective:    History of present illness (HPI):       Karen Barber is a 55 y.o. female who presented to [facility] for evaluation of hyperglycemia. She has a Hx of Type 2 DM with hyperglycemia. She endorses she has been compliant with Novolog 1-20 units subcutaneous QID AC and HS, Basaglar 24 units nightly however home blood sugar readings have ***. POCT Barber drawn 01/19/21 showed an improvement in Karen Barber from 8.8% 05/27/20 to 8.0% in November. Patient's medical history was reviewed, see Past Medical History. Medications used in the past were reviewed, see medications. Patient reports eating and eliminating well. Pt is sleeping well. Patient has not required emergency room treatment for these symptoms, and has not required hospitalization.     Denies HA, Fever, chest pain, N/V/D, bowel or bladder issues, or swelling.     Relevant ROS: Reviewed 12 systems, positive findings listed, all others negative.    Pertinent Past Med Hx:    Past Medical History:   Diagnosis Date   ??? 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 Breast injury     lung surg on left incision under breast sept 2016   ??? Caregiver burden     parents    ??? Hepatitis C antibody test positive 2016    repeatedly negative HCV RNA, indicating clearance of infection w/o treatment   ??? Hyperlipidemia    ??? Hypertension    ??? 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 On home O2 2008  per Dr Mal Amabile note from 02/21/2017   ??? Pulmonary aspergillosis (CMS-HCC) 2016    A.fumigatus in 2016, prior to LUL lobectomy. A.niger from sputum 08/2016-09/2016.   ??? 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 Sarcoidosis 1995   ??? Type 2 diabetes mellitus with hyperglycemia (CMS-HCC) 07/27/2014   ??? Visual impairment     glasses       Medications:       Current Outpatient Medications:   ???  ACCU-CHEK FASTCLIX LANCET DRUM Misc, USE TO CHECK GLUCOSE 4 TIMES DAILY BEFORE MEAL(S) AND NIGHTLY, Disp: , Rfl:   ???  ACCU-CHEK GUIDE GLUCOSE METER Misc, 1 Device by Other route Four (4) times a day (before meals and nightly). AS DIRECTED, Disp: 1 kit, Rfl: 0  ???  acetaminophen (TYLENOL) 325 MG tablet, Take 2 tablets (650 mg total) by mouth every four (4) hours as needed., Disp: 30 tablet, Rfl: 1  ???  albuterol 2.5 mg /3 mL (0.083 %) nebulizer solution, Inhale 3 mL (2.5 mg total) by nebulization every six (6) hours as needed for wheezing or shortness of breath (airway clearance/cough)., Disp: 120 mL, Rfl: 2  ???  albuterol HFA 90 mcg/actuation inhaler, Inhale 2 puffs every four (4) hours as needed for wheezing or shortness of breath., Disp: 24 g, Rfl: 1  ???  amLODIPine (NORVASC) 5 MG tablet, Take 5 mg by mouth daily. Added per patient, Disp: , Rfl:   ???  aspirin 325 MG tablet, Take 325 mg by mouth daily., Disp: , Rfl:   ???  blood sugar diagnostic Strp, by Other route Three (3) times a day. ACCU-CHEK Guide meter, Disp: 300 strip, Rfl: 1  ???  budesonide-formoteroL (SYMBICORT) 160-4.5 mcg/actuation inhaler, Inhale 2 puffs Two (2) times a day., Disp: 10.2 g, Rfl: 3  ???  cyclobenzaprine (FLEXERIL) 10 MG tablet, Take 1 tablet by mouth three times daily as needed, Disp: 90 tablet, Rfl: 0  ???  furosemide (LASIX) 20 MG tablet, Take 20 mg by mouth daily as needed., Disp: , Rfl:   ???  gabapentin (NEURONTIN) 300 MG capsule, Take 1 capsule (300 mg total) by mouth Three (3) times a day., Disp: 270 capsule, Rfl: 1  ???  insulin ASPART (NOVOLOG U-100 INSULIN ASPART) 100 unit/mL injection, Take 1-20 units SQ QID AC and HS as directed, Disp: 20 mL, Rfl: 1  ???  insulin glargine (BASAGLAR, LANTUS) 100 unit/mL (3 mL) injection pen, Inject 0.24 mL (24 Units total) under the skin nightly., Disp: 21 mL, Rfl: 1  ???  ipratropium (ATROVENT) 0.02 % nebulizer solution, Inhale 2.5 mL (500 mcg total) by nebulization Four (4) times a day. (Patient taking differently: Inhale 500 mcg by nebulization Two (2) times a day.), Disp: 900 mL, Rfl: 1  ???  MEDICAL SUPPLY ITEM, Accucheck Glide Glocumeter for home glucose checks. Check blood glucose ACHS. (Patient not taking: Reported on 11/02/2020), Disp: 1 Package, Rfl: 0  ???  mepolizumab (NUCALA) 100 mg/mL AtIn, Inject the contents of 1 syringe (100 mg) under the skin every twenty-eight (28) days., Disp: 1 mL, Rfl: 12  ???  miscellaneous medical supply Misc, Nebulizer machine and tubing. Patients machine is 57 years old., Disp: 1 each, Rfl: 0  ???  OXYGEN-AIR DELIVERY SYSTEMS MISC, Dose: 2-3 LPM, Disp: , Rfl:   ???  posaconazole (NOXAFIL) 100 mg delayed released tablet, Take 2 tablets (200 mg total) by mouth daily., Disp: 60 tablet, Rfl: 2  ???  pravastatin (PRAVACHOL) 40 MG tablet, Take 1 tablet (40 mg  total) by mouth daily with evening meal. (Patient not taking: Reported on 11/02/2020), Disp: 90 tablet, Rfl: 1  ???  predniSONE (DELTASONE) 5 MG tablet, Take 1 tablet (5 mg total) by mouth daily., Disp: 30 tablet, Rfl: 3  ???  sildenafiL, pulm.hypertension, (REVATIO) 20 mg tablet, Take 1 tablet (20 mg total) by mouth Three (3) times a day., Disp: 270 tablet, Rfl: 1  ???  treprostiniL (TYVASO) 1.74 mg/2.9 mL (0.6 mg/mL) Nebu, Inhale 18 mcg four (4) times a day. , Disp: , Rfl:      Allergies:   No Known Allergies    Pertinent Social Hx and Habits: EMR reviewed    Pertinent Family Hx: EMR reviewed    Objective:      BP Readings from Last 3 Encounters:   11/02/20 110/76   07/20/20 118/70   06/24/20 127/74        There were no vitals filed for this visit.     Physical Exam:    General Appearance: WDWN in NAD      Skin: W, D, I  HEENT: PERRLA, EOMI, TM's clear  Respiratory: Clear throughout  Cardio: RRR  Abdomen: Soft and non-tnder  Neurologic: A & O X 4, Grossly intact, stable gait  PSYCH: Behavior calm and cooperative    Diagnostics:     Lab Results   Component Value Date    WBC 4.5 06/08/2020    HGB 10.2 (L) 06/08/2020    HCT 31.3 (L) 06/08/2020    PLT 284 06/08/2020       Lab Results   Component Value Date    NA 135 06/08/2020    K 4.5 06/08/2020    CL 98 06/08/2020    CO2 34.0 (H) 06/08/2020    BUN 26 (H) 06/08/2020    CREATININE 1.21 (H) 06/08/2020    GLU 320 (H) 06/08/2020    CALCIUM 9.3 06/08/2020    MG 1.9 12/15/2019    PHOS 4.1 12/15/2019       Lab Results   Component Value Date    BILITOT 0.5 12/11/2019    BILIDIR <0.10 05/30/2019    PROT 7.1 12/11/2019    ALBUMIN 3.3 (L) 12/11/2019    ALT 15 12/11/2019 AST 16 12/11/2019    ALKPHOS 88 12/11/2019    GGT 60 (H) 05/31/2011       Lab Results   Component Value Date    PT 11.9 12/11/2019    INR 1.02 12/11/2019    APTT 30.9 12/11/2019        TSH   Date Value Ref Range Status   11/05/2012 1.48 0.60 - 3.30 u[iU]/mL Final        I attest that I, Benita Stabile, personally documented this note while acting as scribe for Olena Leatherwood, NP.      Benita Stabile, Scribe.  02/22/2021     The documentation recorded by the scribe accurately reflects the service I personally performed and the decisions made by me.     Olena Leatherwood, NP    Dr. Betha Loa, DNP, FNP-BC  [facility] at [facility] Certified Doctor of Nursing Practice   [phone]

## 2021-03-01 NOTE — Unmapped (Signed)
[facility] Refill Coordination Note    Specialty Medication(s) to be Shipped:   CF/Pulmonary: -nucala 100mg /ml    Other medication(s) to be shipped: No additional medications requested for fill at this time     Karen Barber, DOB: Jan 25, 1966  Phone: [phone] (home)       All above HIPAA information was verified with patient.     Was a Nurse, learning disability used for this call? No    Completed refill call assessment today to schedule patient's medication shipment from the [facility] [phone]).  All relevant notes have been reviewed.     Specialty medication(s) and dose(s) confirmed: Regimen is correct and unchanged.   Changes to medications: Karen Barber reports no changes at this time.  Changes to insurance: No  New side effects reported not previously addressed with a pharmacist or physician: None reported  Questions for the pharmacist: No    Confirmed patient received a Conservation officer, historic buildings and a Surveyor, mining with first shipment. The patient will receive a drug information handout for each medication shipped and additional FDA Medication Guides as required.       DISEASE/MEDICATION-SPECIFIC INFORMATION        For patients on injectable medications: Patient currently has 0 doses left.  Next injection is scheduled for 12/14-asap.    SPECIALTY MEDICATION ADHERENCE     Medication Adherence    Patient reported X missed doses in the last month: 1  Specialty Medication: Nucala 100mg /ml Inj  Patient is on additional specialty medications: No  Patient is on more than two specialty medications: No  Informant: patient  Reliability of informant: reliable   Other non-adherence reason: pt missed dose due to lost phone   Confirmed plan for next specialty medication refill: delivery by pharmacy  Refills needed for supportive medications: not needed          Refill Coordination    Has the Patients' Contact Information Changed: No  Is the Shipping Address Different: No         Were doses missed due to medication being on hold? No    Nucala 100 mg/ml: 0 days of medicine on hand         REFERRAL TO PHARMACIST     Referral to the pharmacist: Not needed      [facility]     Shipping address confirmed in Epic.     Delivery Scheduled: Yes, Expected medication delivery date: 12/14.     Medication will be delivered via Same Day Courier to the prescription address in Epic WAM.    Karen Barber   [facility] Specialty Technician

## 2021-03-02 MED FILL — NUCALA 100 MG/ML SUBCUTANEOUS AUTO-INJECTOR: SUBCUTANEOUS | 28 days supply | Qty: 1 | Fill #8

## 2021-03-23 NOTE — Unmapped (Signed)
[facility] Refill Coordination Note    Specialty Medication(s) to be Shipped:   CF/Pulmonary: -Nucala 100mg /ml Inj  Other medication(s) to be shipped: No additional medications requested for fill at this Barber     Karen Barber, DOB: 01/05/1966  Phone: [phone] (home)     All above HIPAA information was verified with patient.     Was a Nurse, learning disability used for this call? No    Completed refill call assessment today to schedule patient's medication shipment from the [facility] [phone]).  All relevant notes have been reviewed.     Specialty medication(s) and dose(s) confirmed: Regimen is correct and unchanged.   Changes to medications: Karen Barber.  Changes to insurance: No  New side effects reported not previously addressed with a pharmacist or physician: None reported  Questions for the pharmacist: No    Confirmed patient received a Conservation officer, historic buildings and a Surveyor, mining with first shipment. The patient will receive a drug information handout for each medication shipped and additional FDA Medication Guides as required.       DISEASE/MEDICATION-SPECIFIC INFORMATION        For CF patients: CF [facility] Active? No-not enrolled    SPECIALTY MEDICATION ADHERENCE     Medication Adherence    Patient reported X missed doses in the last month: 0  Specialty Medication: Nucala 100mg /ml Inj  Patient is on additional specialty medications: No  Patient is on more than two specialty medications: No  Informant: patient  Reliability of informant: reliable  Reasons for non-adherence: no problems identified  Confirmed plan for next specialty medication refill: delivery by pharmacy  Refills needed for supportive medications: not needed        Were doses missed due to medication being on hold? No    Nucala 100mg /ml Inj : 0 days of medicine on hand (Next Injection: 04/16/21)    REFERRAL TO PHARMACIST     Referral to the pharmacist: Not needed    [facility]     Shipping address confirmed in Epic.     Delivery Scheduled: Yes, Expected medication delivery date: 04/06/2021.     Medication will be delivered via Same Day Courier to the prescription address in Epic WAM.    Marko Skalski P Allena Katz   [facility] Specialty Technician

## 2021-03-24 ENCOUNTER — Ambulatory Visit
Admission: EM | Admit: 2021-03-24 | Discharge: 2021-03-24 | Disposition: A | Discharge status: Home / Self Care | Payer: Medicare Other

## 2021-03-24 ENCOUNTER — Other Ambulatory Visit: Payer: Self-pay

## 2021-03-24 DIAGNOSIS — U071 COVID-19: Secondary | ICD-10-CM

## 2021-03-24 MED ORDER — PROMETHAZINE-DM 6.25-15 MG/5ML PO SYRP: 5.0000 mL | ORAL_SOLUTION | Freq: Four times a day (QID) | ORAL | 0 refills | Status: AC | PRN

## 2021-03-24 MED ORDER — NIRMATRELVIR/RITONAVIR (PAXLOVID) TABLET (RENAL DOSING): 2.0000 | ORAL_TABLET | Freq: Two times a day (BID) | ORAL | 0 refills | Status: AC

## 2021-03-24 MED ORDER — IPRATROPIUM BROMIDE 0.06 % NA SOLN: 2.0000 | Freq: Four times a day (QID) | NASAL | 12 refills | Status: AC

## 2021-03-24 MED ORDER — BENZONATATE 100 MG PO CAPS: 200.0000 mg | ORAL_CAPSULE | Freq: Three times a day (TID) | ORAL | 0 refills | Status: AC

## 2021-03-24 NOTE — ED Provider Notes (Signed)
[facility]    CSN: [id number] Arrival date & time: 03/24/21  1055      History   Chief Complaint Chief Complaint  Patient presents with   Cough    HPI Charlotte Cross is a 56 y.o. female.   HPI  56 year old female here for evaluation of respiratory complaints.  Patient ports that she has been experiencing runny nose, nasal congestion, and a cough that is minimally productive for the last 2 days.  She denies fever, ear pain, sore throat, or increase in her shortness of breath.  Patient is on oxygen secondary to sarcoidosis.  Patient reports that she was supposed to her mom who tested positive for COVID 2 days ago and the patient tested positive for COVID this morning.  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      Home Medications    Prior to Admission medications   Medication Sig Start Date End Date Taking? Authorizing Provider  benzonatate (TESSALON) 100 MG capsule Take 2 capsules (200 mg total) by mouth every 8 (eight) hours. 03/24/21  Yes Margarette Canada, NP  ipratropium (ATROVENT) 0.06 % nasal spray Place 2 sprays into both nostrils 4 (four) times daily. 03/24/21  Yes Margarette Canada, NP  nirmatrelvir/ritonavir EUA, renal dosing, (PAXLOVID) 10 x 150 MG & 10 x 100MG TABS Take 2 tablets by mouth 2 (two) times daily for 5 days. Patient GFR is 58. Take nirmatrelvir (150 mg) one tablet twice daily for 5 days and ritonavir (100 mg) one tablet twice daily for 5 days. 03/24/21 03/29/21 Yes Margarette Canada, NP  promethazine-dextromethorphan (PROMETHAZINE-DM) 6.25-15 MG/5ML syrup Take 5 mLs by mouth 4 (four) times daily as needed. 03/24/21  Yes Margarette Canada, NP  Accu-Chek FastClix Lancets MISC USE TO CHECK GLUCOSE 4 TIMES DAILY BEFORE MEAL(S) AND NIGHTLY 06/02/18   [provider]  acetaminophen (TYLENOL) 325 MG tablet Take by mouth. 12/15/19   [provider]  albuterol (PROVENTIL HFA;VENTOLIN HFA) 108 (90 BASE) MCG/ACT inhaler Inhale 2 puffs into the lungs every 6 (six) hours as needed for wheezing or shortness of breath.    [provider]  albuterol (PROVENTIL) (2.5 MG/3ML) 0.083% nebulizer solution Inhale 2.5 mg into the lungs every 6 (six) hours as needed for wheezing or shortness of breath.     [provider]  albuterol (VENTOLIN HFA) 108 (90 Base) MCG/ACT inhaler Inhale into the lungs. Patient not taking: Reported on 03/30/2020 12/19/19 12/18/20  [provider]  amLODipine (NORVASC) 5 MG tablet Take by mouth. 03/02/20 03/02/21  [provider]  aspirin EC 325 MG tablet Take 325 mg by mouth daily.    [provider]  Blood Glucose Monitoring Suppl (ACCU-CHEK GUIDE) w/Device KIT 1 Device by Other route Four (4) times a day (before meals and nightly). AS DIRECTED 06/25/18   [provider]  budesonide (PULMICORT) 0.5 MG/2ML nebulizer solution Inhale 2 mLs (0.5 mg total) into the lungs every 12 (  twelve) hours. 08/27/16   Hillary Bow, MD  budesonide-formoterol (SYMBICORT) 160-4.5 MCG/ACT inhaler Inhale into the lungs. 02/26/20 05/26/20  [provider]  cyclobenzaprine (FLEXERIL) 10 MG tablet Take 10 mg by mouth 3 (three) times daily as needed for muscle spasms.     [provider]  feeding supplement, ENSURE ENLIVE, (ENSURE ENLIVE) LIQD Take 237 mLs by mouth daily. Patient not taking: Reported on 03/30/2020 09/03/16   Bettey Costa, MD  furosemide (LASIX) 20 MG tablet Take by mouth. 03/24/20 04/23/20  [provider]  gabapentin (NEURONTIN) 300 MG capsule Take 300 mg by mouth 3 (three) times daily.    [provider]  hydrochlorothiazide (HYDRODIURIL) 25 MG tablet Take 25 mg by mouth daily.    [provider]  insulin aspart (NOVOLOG) 100 UNIT/ML injection INJECT 1 TO 20 UNIT(S) SUBCUTANEOUSLY 4 TIMES DAILY BEFORE MEALS AND AT BEDTIME AS DIRECTED 10/13/19   [provider]  Insulin Glargine (BASAGLAR KWIKPEN) 100 UNIT/ML 18 Units nightly 12/11/19   [provider]  ipratropium (ATROVENT) 0.02 % nebulizer solution 2 (two) times daily 12/11/19   [provider]  ketoconazole (NIZORAL) 2 % cream Apply topically. 12/18/19   [provider]  levofloxacin (LEVAQUIN) 750 MG tablet Take 1 tablet (750 mg total) by mouth daily. Patient not taking: Reported on 03/30/2020 09/03/16   Bettey Costa, MD  lisinopril (PRINIVIL,ZESTRIL) 10 MG tablet Take 10 mg by mouth daily.  Patient not taking: Reported on 03/30/2020    [provider]  Mepolizumab (NUCALA) 100 MG/ML SOAJ Take by mouth. 07/03/19   [provider]  metFORMIN (GLUCOPHAGE) 1000 MG tablet Take 1,000 mg by mouth 2 (two) times daily with a meal.    [provider]  posaconazole (NOXAFIL) 100 MG TBEC delayed-release tablet Take by mouth. 12/11/19   [provider]  pravastatin (PRAVACHOL) 40 MG tablet Take 40 mg by mouth at bedtime.    [provider]  sildenafil (REVATIO) 20 MG tablet Take by mouth. 10/15/19   [provider]  Treprostinil (TYVASO) 0.6 MG/ML SOLN Inhale into the lungs.    [provider]    Family History Family History  Problem Relation Age of Onset   Diabetes Mother    Diabetes Father    Diabetes Brother     Social History Social History   Tobacco Use   Smoking status: Former    Packs/day: 0.25    Years: 6.00    Pack years: 1.50    Types: Cigarettes    Quit date: 03/21/1987    Years since quitting: 34.0    Smokeless tobacco: Never  Vaping Use   Vaping Use: Never used  Substance Use Topics   Alcohol use: No   Drug use: No     Allergies   Patient has no known allergies.   Review of Systems Review of Systems  Constitutional:  Negative for activity change, appetite change and fever.  HENT:  Positive for congestion and rhinorrhea. Negative for ear pain and sore throat.   Respiratory:  Positive for cough. Negative for shortness of breath and wheezing.   Gastrointestinal:  Negative for diarrhea, nausea and vomiting.  Skin:  Negative for rash.  Hematological: Negative.   Psychiatric/Behavioral: Negative.      Physical Exam Triage Vital Signs ED Triage Vitals  Enc Vitals Group     BP 03/24/21 1234 140/86     Pulse Rate 03/24/21 1234 92     Resp 03/24/21 1234 16  Temp 03/24/21 1234 98.3 F (36.8 C)     Temp Source 03/24/21 1234 Oral     SpO2 03/24/21 1234 98 %     Weight --      Height --      Head Circumference --      Peak Flow --      Pain Score 03/24/21 1106 0     Pain Loc --      Pain Edu? --      Excl. in [place]? --    No data found.  Updated Vital Signs BP 140/86 (BP Location: Left Arm)    Pulse 92    Temp 98.3 F (36.8 C) (Oral)    Resp 16    LMP 11/24/2014    SpO2 98%   Visual Acuity Right Eye Distance:   Left Eye Distance:   Bilateral Distance:    Right Eye Near:   Left Eye Near:    Bilateral Near:     Physical Exam Vitals and nursing note reviewed.  Constitutional:      General: She is not in acute distress.    Appearance: Normal appearance. She is not ill-appearing.  HENT:     Head: Normocephalic and atraumatic.     Right Ear: Tympanic membrane, ear canal and external ear normal. There is no impacted cerumen.     Left Ear: Tympanic membrane, ear canal and external ear normal. There is no impacted cerumen.     Nose: Congestion and rhinorrhea present.     Mouth/Throat:     Mouth: Mucous membranes are moist.     Pharynx: Oropharynx is clear. No  posterior oropharyngeal erythema.  Cardiovascular:     Rate and Rhythm: Normal rate and regular rhythm.     Pulses: Normal pulses.     Heart sounds: Normal heart sounds. No murmur heard.   No friction rub. No gallop.  Pulmonary:     Effort: Pulmonary effort is normal.     Breath sounds: Normal breath sounds. No wheezing, rhonchi or rales.  Musculoskeletal:     Cervical back: Normal range of motion and neck supple.  Lymphadenopathy:     Cervical: No cervical adenopathy.  Skin:    General: Skin is warm and dry.     Capillary Refill: Capillary refill takes less than 2 seconds.     Findings: No erythema or rash.  Neurological:     General: No focal deficit present.     Mental Status: She is alert and oriented to person, place, and time.  Psychiatric:        Mood and Affect: Mood normal.        Behavior: Behavior normal.        Thought Content: Thought content normal.        Judgment: Judgment normal.     UC Treatments / Results  Labs (all labs ordered are listed, but only abnormal results are displayed) Labs Reviewed - No data to display  EKG   Radiology No results found.  Procedures Procedures (including critical care time)  Medications Ordered in UC Medications - No data to display  Initial Impression / Assessment and Plan / UC Course  I have reviewed the triage vital signs and the nursing notes.  Pertinent labs & imaging results that were available during my care of the patient were reviewed by me and considered in my medical decision making (see chart for details).  Patient is very pleasant, nontoxic-appearing 56 year old female with a significant history to include  sarcoidosis, hypertension, diabetes, and COPD who presents for evaluation after testing positive for COVID this morning.  She had respiratory symptoms for the past 2 days.  Her physical exam reveals protegrin tympanic membranes bilaterally with normal light reflex and clear external auditory canals.   Nasal mucosa is erythematous and edematous with scant clear discharge in both nares.  Oropharyngeal exam is benign.  No cervical lymphadenopathy appreciated exam.  Cardiopulmonary exam reveals clear lung sounds in all fields.  Patient has a CMP from 06/08/2020 which showed a GFR of 58 at that time.  I will discharge her home on the renal dose of Paxlovid for treatment of the COVID-19 along with Atrovent nasal spray, Tessalon Perles, and Promethazine DM cough syrup.  ER precautions reviewed with patient who verbalized an understanding of same.   Final Clinical Impressions(s) / UC Diagnoses   Final diagnoses:  [id number]     Discharge Instructions      You will have to quarantine for 5 days from the start of your symptoms.  After 5 days you can break quarantine if your symptoms have improved and you have not had a fever for 24 hours without taking Tylenol or ibuprofen.  Use over-the-counter Tylenol and ibuprofen as needed for body aches and fever.  Use the Atrovent nasal spray, 2 squirts in each nostril every 6 hours, as needed for runny nose and postnasal drip.  Use the Tessalon Perles every 8 hours during the day.  Take them with a small sip of water.  They may give you some numbness to the base of your tongue or a metallic taste in your mouth, this is normal.  Use the Promethazine DM cough syrup at bedtime for cough and congestion.  It will make you drowsy so do not take it during the day.  If you develop any increased shortness of breath-especially at rest, you are unable to speak in full sentences, or is a late sign your lips are turning blue you need to go the ER for evaluation.      ED Prescriptions     Medication Sig Dispense Auth. Provider   nirmatrelvir/ritonavir EUA, renal dosing, (PAXLOVID) 10 x 150 MG & 10 x 100MG TABS Take 2 tablets by mouth 2 (two) times daily for 5 days. Patient GFR is 58. Take nirmatrelvir (150 mg) one tablet twice daily for 5 days and ritonavir (100 mg)  one tablet twice daily for 5 days. 20 tablet Margarette Canada, NP   benzonatate (TESSALON) 100 MG capsule Take 2 capsules (200 mg total) by mouth every 8 (eight) hours. 21 capsule Margarette Canada, NP   ipratropium (ATROVENT) 0.06 % nasal spray Place 2 sprays into both nostrils 4 (four) times daily. 15 mL Margarette Canada, NP   promethazine-dextromethorphan (PROMETHAZINE-DM) 6.25-15 MG/5ML syrup Take 5 mLs by mouth 4 (four) times daily as needed. 118 mL Margarette Canada, NP      PDMP not reviewed this encounter.   Margarette Canada, NP 03/24/21 1330

## 2021-03-24 NOTE — ED Triage Notes (Signed)
Patient presents to Urgent Care with complaints of a cough x 2 days. Exposed to mom who tested positive for covid. Pt tested positive today. Not taking any medications. Here for covid medications.

## 2021-03-24 NOTE — Discharge Instructions (Signed)
You will have to quarantine for 5 days from the start of your symptoms.  After 5 days you can break quarantine if your symptoms have improved and you have not had a fever for 24 hours without taking Tylenol or ibuprofen.  Use over-the-counter Tylenol and ibuprofen as needed for body aches and fever.  Use the Atrovent nasal spray, 2 squirts in each nostril every 6 hours, as needed for runny nose and postnasal drip.  Use the Tessalon Perles every 8 hours during the day.  Take them with a small sip of water.  They may give you some numbness to the base of your tongue or a metallic taste in your mouth, this is normal.  Use the Promethazine DM cough syrup at bedtime for cough and congestion.  It will make you drowsy so do not take it during the day.  If you develop any increased shortness of breath-especially at rest, you are unable to speak in full sentences, or is a late sign your lips are turning blue you need to go the ER for evaluation.

## 2021-03-30 DIAGNOSIS — D869 Sarcoidosis, unspecified: Principal | ICD-10-CM

## 2021-03-30 DIAGNOSIS — J449 Chronic obstructive pulmonary disease, unspecified: Principal | ICD-10-CM

## 2021-03-30 DIAGNOSIS — B441 Other pulmonary aspergillosis: Principal | ICD-10-CM

## 2021-03-30 DIAGNOSIS — I272 Pulmonary hypertension, unspecified: Principal | ICD-10-CM

## 2021-03-30 MED ORDER — ALBUTEROL SULFATE 2.5 MG/3 ML (0.083 %) SOLUTION FOR NEBULIZATION
Freq: Four times a day (QID) | RESPIRATORY_TRACT | 2 refills | 10 days | Status: CP | PRN
Start: 2021-03-30 — End: 2022-03-30

## 2021-03-30 MED ORDER — AMLODIPINE 5 MG TABLET
ORAL_TABLET | Freq: Every day | ORAL | 0 refills | 90 days | Status: CP
Start: 2021-03-30 — End: 2022-03-30

## 2021-03-30 MED ORDER — GABAPENTIN 300 MG CAPSULE
ORAL_CAPSULE | Freq: Three times a day (TID) | ORAL | 1 refills | 90 days | Status: CP
Start: 2021-03-30 — End: 2022-03-31

## 2021-03-30 MED ORDER — ALBUTEROL SULFATE HFA 90 MCG/ACTUATION AEROSOL INHALER
RESPIRATORY_TRACT | 1 refills | 0 days | Status: CP | PRN
Start: 2021-03-30 — End: 2022-03-30

## 2021-03-30 MED ORDER — IPRATROPIUM BROMIDE 0.02 % SOLUTION FOR INHALATION
Freq: Four times a day (QID) | RESPIRATORY_TRACT | 1 refills | 90 days | Status: CP
Start: 2021-03-30 — End: 2022-03-30

## 2021-03-30 MED ORDER — SILDENAFIL (PULMONARY HYPERTENSION) 20 MG TABLET
ORAL_TABLET | Freq: Three times a day (TID) | ORAL | 0 refills | 90 days | Status: CP
Start: 2021-03-30 — End: 2022-03-30

## 2021-03-30 MED ORDER — POSACONAZOLE 100 MG TABLET,DELAYED RELEASE
ORAL_TABLET | Freq: Every day | ORAL | 2 refills | 30 days | Status: CP
Start: 2021-03-30 — End: ?

## 2021-03-30 MED ORDER — FUROSEMIDE 20 MG TABLET
ORAL_TABLET | Freq: Every day | ORAL | 0 refills | 90 days | Status: CP | PRN
Start: 2021-03-30 — End: 2022-03-30

## 2021-03-30 NOTE — Unmapped (Signed)
Patient is requesting the following refill  Requested Prescriptions     Pending Prescriptions Disp Refills   ??? ipratropium (ATROVENT) 0.02 % nebulizer solution 900 mL 1     Sig: Inhale 2.5 mL (500 mcg total) by nebulization four (4) times a day.   ??? amLODIPine (NORVASC) 5 MG tablet 90 tablet 0     Sig: Take 1 tablet (5 mg total) by mouth daily. Added per patient   ??? furosemide (LASIX) 20 MG tablet  0     Sig: Take 1 tablet (20 mg total) by mouth daily as needed.   ??? sildenafiL, pulm.hypertension, (REVATIO) 20 mg tablet 270 tablet 1     Sig: Take 1 tablet (20 mg total) by mouth Three (3) times a day.   ??? gabapentin (NEURONTIN) 300 MG capsule 270 capsule 1     Sig: Take 1 capsule (300 mg total) by mouth Three (3) times a day.   ??? albuterol 2.5 mg /3 mL (0.083 %) nebulizer solution 120 mL 2     Sig: Inhale 3 mL (2.5 mg total) by nebulization every six (6) hours as needed for wheezing or shortness of breath (airway clearance/cough).   ??? albuterol HFA 90 mcg/actuation inhaler 24 g 1     Sig: Inhale 2 puffs every four (4) hours as needed for wheezing or shortness of breath.       Recent Visits  Date Type Provider Dept   11/02/20 Office Visit Keri Rosita Fire, FNP [facility]   Showing recent visits within past 365 days with a meds authorizing provider and meeting all other requirements  Future Appointments  Date Type Provider Dept   04/05/21 Appointment Keri Rosita Fire, FNP  [facility]   Showing future appointments within next 365 days with a meds authorizing provider and meeting all other requirements       Labs:   Creatinine:   Creatinine (mg/dL)   Date Value   82/95/6213 1.21 (H)   07/03/2014 0.70    Potassium:   Potassium (mmol/L)   Date Value   06/08/2020 4.5     Potassium, Bld (mmol/L)   Date Value   02/13/2018 4.9 (H)    Sodium:   Sodium (mmol/L)   Date Value   06/08/2020 135     Sodium Whole Blood (mmol/L)   Date Value   02/13/2018 137

## 2021-04-06 DIAGNOSIS — Z7952 Long term (current) use of systemic steroids: Principal | ICD-10-CM

## 2021-04-06 DIAGNOSIS — D7219 Other eosinophilia: Principal | ICD-10-CM

## 2021-04-06 DIAGNOSIS — J4550 Severe persistent asthma, uncomplicated: Principal | ICD-10-CM

## 2021-04-06 NOTE — Unmapped (Signed)
Deniece Ree 's Nucala shipment will be delayed as a result of prior authorization being required by the patient's insurance.     I have reached out to the patient  at [phone] and communicated the delay. We will call the patient back to reschedule the delivery upon resolution. We have not confirmed the new delivery date.

## 2021-04-08 MED FILL — NUCALA 100 MG/ML SUBCUTANEOUS AUTO-INJECTOR: SUBCUTANEOUS | 28 days supply | Qty: 1 | Fill #9

## 2021-04-08 NOTE — Unmapped (Signed)
Karen Barber 's Nucala shipment will be sent out  as a result of prior authorization now approved.     I have reached out to the patient  at [phone] and communicated the delivery change. We will reschedule the medication for the delivery date that the patient agreed upon.  We have confirmed the delivery date as 01/20, via same day courier.

## 2021-04-19 ENCOUNTER — Ambulatory Visit: Admit: 2021-04-19 | Discharge: 2021-04-19 | Payer: MEDICARE | Attending: Family | Primary: Family

## 2021-04-19 DIAGNOSIS — G8929 Other chronic pain: Principal | ICD-10-CM

## 2021-04-19 DIAGNOSIS — Z1231 Encounter for screening mammogram for malignant neoplasm of breast: Principal | ICD-10-CM

## 2021-04-19 DIAGNOSIS — M25511 Pain in right shoulder: Principal | ICD-10-CM

## 2021-04-19 DIAGNOSIS — Z1211 Encounter for screening for malignant neoplasm of colon: Principal | ICD-10-CM

## 2021-04-19 DIAGNOSIS — I10 Essential (primary) hypertension: Principal | ICD-10-CM

## 2021-04-19 DIAGNOSIS — Z1212 Encounter for screening for malignant neoplasm of rectum: Principal | ICD-10-CM

## 2021-04-19 DIAGNOSIS — J0100 Acute maxillary sinusitis, unspecified: Principal | ICD-10-CM

## 2021-04-19 DIAGNOSIS — E1165 Type 2 diabetes mellitus with hyperglycemia: Principal | ICD-10-CM

## 2021-04-19 DIAGNOSIS — Z794 Long term (current) use of insulin: Principal | ICD-10-CM

## 2021-04-19 LAB — COMPREHENSIVE METABOLIC PANEL
ALBUMIN: 3.7 g/dL (ref 3.4–5.0)
ALKALINE PHOSPHATASE: 73 U/L (ref 46–116)
ALT (SGPT): 12 U/L (ref 10–49)
ANION GAP: 7 mmol/L (ref 5–14)
AST (SGOT): 16 U/L (ref <=34)
BILIRUBIN TOTAL: 0.5 mg/dL (ref 0.3–1.2)
BLOOD UREA NITROGEN: 15 mg/dL (ref 9–23)
BUN / CREAT RATIO: 18
CALCIUM: 9.5 mg/dL (ref 8.7–10.4)
CHLORIDE: 99 mmol/L (ref 98–107)
CO2: 35 mmol/L — ABNORMAL HIGH (ref 20.0–31.0)
CREATININE: 0.85 mg/dL — ABNORMAL HIGH
EGFR CKD-EPI (2021) FEMALE: 80 mL/min/{1.73_m2} (ref >=60)
GLUCOSE RANDOM: 157 mg/dL — ABNORMAL HIGH (ref 70–99)
POTASSIUM: 4.3 mmol/L (ref 3.4–4.8)
PROTEIN TOTAL: 7.3 g/dL (ref 5.7–8.2)
SODIUM: 141 mmol/L (ref 135–145)

## 2021-04-19 LAB — CBC
HEMATOCRIT: 35.8 % (ref 34.0–44.0)
HEMOGLOBIN: 11.5 g/dL (ref 11.3–14.9)
MEAN CORPUSCULAR HEMOGLOBIN CONC: 32 g/dL (ref 32.0–36.0)
MEAN CORPUSCULAR HEMOGLOBIN: 25.6 pg — ABNORMAL LOW (ref 25.9–32.4)
MEAN CORPUSCULAR VOLUME: 80.1 fL (ref 77.6–95.7)
MEAN PLATELET VOLUME: 9.3 fL (ref 6.8–10.7)
PLATELET COUNT: 208 10*9/L (ref 150–450)
RED BLOOD CELL COUNT: 4.47 10*12/L (ref 3.95–5.13)
RED CELL DISTRIBUTION WIDTH: 14.3 % (ref 12.2–15.2)
WBC ADJUSTED: 5.8 10*9/L (ref 3.6–11.2)

## 2021-04-19 MED ORDER — AMOXICILLIN 875 MG-POTASSIUM CLAVULANATE 125 MG TABLET
ORAL_TABLET | Freq: Two times a day (BID) | ORAL | 0 refills | 10 days | Status: CP
Start: 2021-04-19 — End: 2021-04-29

## 2021-04-19 NOTE — Unmapped (Signed)
Assessment and Plan:     [name] was seen today for edema, sinus problem, headache and cough.    Diagnoses and all orders for this visit:    Acute non-recurrent maxillary sinusitis  Advised to discontinue Sinex nasal spray and counseled on potential for rebound congestion with prolonged use (> 3 days). Treat sinusitis with Augmentin. Counseled on medication dosage instructions, benefits vs risks, potential side effect profile. Reviewed urgent and return precautions.   -     amoxicillin-clavulanate (AUGMENTIN) 875-125 mg per tablet; Take 1 tablet by mouth Two (2) times a day for 10 days.    Type 2 diabetes mellitus with hyperglycemia, with long-term current use of insulin (CMS-HCC)  HGB A1c 8.0 (01/19/21).   Continue Novolog Aspart with sliding scale dosage.   Continue Lantus 24 units nightly.   Encouraged patient to continue carb controlled   -     Cancel: Albumin/creatinine urine ratio  -     Comprehensive Metabolic Panel    Primary hypertension  BP at goal (130/84 in clinic today). Continue amlodipine 5 mg daily and furosemide 20 mg daily prn. Counseling provided on low sodium diet and regular exercise. Advised patient to continue to monitor and log at-home BP readings  -     CBC  -     Comprehensive Metabolic Panel    Chronic right shoulder pain  -     XR Shoulder 3 Or More Views Right; Future    Encounter for screening mammogram for malignant neoplasm of breast  -     Mammography screening bilateral; Future    Screening for colorectal cancer  -     Immunochemical Fecal Occult Blood Test (FIT), automated        I personally spent 40 minutes face-to-face and non-face-to-face in the care of this patient, which includes all pre, intra, and post visit time on the date of service.    Return in about 3 months (around 07/17/2021) for Next scheduled follow up.    HPI:      Deniece Ree  is here for   Chief Complaint   Patient presents with   ??? Edema     Swelling in her feet and legs o/o.  Currently swelling is down.   ??? Sinus Problem     Nose is plugged up since Covid. Had Covid 03/22/2021.   ??? Headache   ??? Cough     Productive cough.     Patient presents for evaluation of edema of her lower extremities. She reports edema is off and on but has almost resolved since scheduling this appointment. Increased water intake and reduced sodium and Pepsi intake.     She would like to address ongoing respiratory symptoms. Positive for Covid 19 infection 03/22/21. Since that time she suffers from nasal stuffiness, which alternates between her two nostrils. Reports having trouble breathing out of the nostril which is stopped up. Blowing nose causes bleeding. She was using Sudafed and Dristan during Covid infection. Began using Sinex nasal spray on January 8th or 9th. She is currently using nasal spray every 5 minutes. Uses home oxygen. Cough is productive for thick yellow-brown mucus, sometimes hard pieces from her throat.      Taking tylenol frequently, at least 3 daily, for right shoulder pain. Reports her dog pulled leash last year and her shoulder has bothered her ever since.   Pain when sleeping if she rolls over on that side. Pain increases with any jerking movement and with hyperextension.  Diabetes: Patient presents for follow up of diabetes.????A1C goal is <8. ??Diabetes has customarily not been at goal (complicated by: steroids, multiple chronic conditions). Current symptoms include: hx of foot ulcers, none currently. Patient denies hyperglycemia, hypoglycemia , increased appetite, nausea, paresthesia of the feet, polydipsia, polyuria, visual disturbances, vomiting and weight loss. Evaluation to date has included: hemoglobin A1C. ??Home sugars: did not bring in meter or log. Current treatment: medication: insulin and metformin??which has been somewhat effective. ??Doing regular exercise: no.    Hypertension: Patient presents for follow-up of hypertension. Blood pressure goal < 140/90.  Hypertension has customarily been at goal.  Home blood pressure readings: did not bring log. Salt intake and diet: salt added to cooking and salt shaker not on table. Associated signs and symptoms: orthopnea, intermittent peripheral edema, tiredness/fatigue. Patient denies: blurred vision, chest pain, headache, neck aches, palpitations, paroxysmal nocturnal dyspnea, and pulsating in the ears. Medication compliance: taking as prescribed. She is not doing regular exercise.       ROS:      Comprehensive 10 point ROS negative unless otherwise stated in the HPI.      [facility] Components:     Medication adherence and barriers to the treatment plan have been addressed. Opportunities to optimize healthy behaviors have been discussed. Patient / caregiver voiced understanding.    Past Medical/Surgical History:     Past Medical History:   Diagnosis Date   ??? 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 Breast injury     lung surg on left incision under breast sept 2016   ??? Caregiver burden     parents    ??? Hepatitis C antibody test positive 2016    repeatedly negative HCV RNA, indicating clearance of infection w/o treatment   ??? Hyperlipidemia    ??? Hypertension    ??? 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 On home O2 2008    per Dr Mal Amabile note from 02/21/2017   ??? Pulmonary aspergillosis (CMS-HCC) 2016    A.fumigatus in 2016, prior to LUL lobectomy. A.niger from sputum 08/2016-09/2016.   ??? 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 Sarcoidosis 1995   ??? Type 2 diabetes mellitus with hyperglycemia (CMS-HCC) 07/27/2014   ??? Visual impairment     glasses     Past Surgical History:   Procedure Laterality Date   ??? LUNG LOBECTOMY     ??? 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 PR AMPUTATION TOE,MT-P JT Right 06/16/2020    Procedure: Right foot amputation of 3rd toe at mpj level;  Surgeon: Britt Bottom, DPM;  Location: MAIN OR [facility];  Service: Vascular   ??? PR RIGHT HEART CATH O2 SATURATION & CARDIAC OUTPUT N/A 04/29/2019    Procedure: Right Heart Catheterization;  Surgeon: Rosana Hoes, MD;  Location: [facility] CATH;  Service: Cardiology   ??? PR THORACOSCOPY SURG TOT PULM DECORT Left 12/14/2014    Procedure: THORACOSCOPY SURG; W/TOT PULM DECORTIC/PNEUMOLYS;  Surgeon: Evert Kohl, MD;  Location: MA[facility];  Service: Cardiothoracic   ??? PR THORACOSCOPY W/THERA WEDGE RESEXN INITIAL UNILAT Left 12/03/2014    Procedure: THORACOSCOPY, SURGICAL; WITH THERAPEUTIC WEDGE RESECTION (EG, MASS, NODULE) INITIAL UNILATERAL;  Surgeon: Jacquelyne Balint  Haithcock, MD;  Location: MAIN OR [facility];  Service: Cardiothoracic       Family History:     Family History   Problem Relation Age of Onset   ??? Diabetes Father    ??? Diabetes Brother    ??? Diabetes Maternal Aunt    ??? Sarcoidosis Maternal Aunt    ??? Diabetes Maternal Uncle    ??? Diabetes Paternal Aunt    ??? Diabetes Paternal Uncle        Social History:     Social History     Tobacco Use   ??? Smoking status: Former     Packs/day: 0.50     Years: 9.00     Pack years: 4.50     Types: Cigarettes     Quit date: 05/03/1987     Years since quitting: 33.9   ??? Smokeless tobacco: Never   Vaping Use   ??? Vaping Use: Never used   Substance Use Topics   ??? Alcohol use: No     Alcohol/week: 0.0 standard drinks   ??? Drug use: No       Allergies:     Patient has no known allergies.    Current Medications:     Current Outpatient Medications   Medication Sig Dispense Refill   ??? ACCU-CHEK FASTCLIX LANCET DRUM Misc USE TO CHECK GLUCOSE 4 TIMES DAILY BEFORE MEAL(S) AND NIGHTLY     ??? ACCU-CHEK GUIDE GLUCOSE METER Misc 1 Device by Other route Four (4) times a day (before meals and nightly). AS DIRECTED 1 kit 0   ??? albuterol 2.5 mg /3 mL (0.083 %) nebulizer solution Inhale 3 mL (2.5 mg total) by nebulization every six (6) hours as needed for wheezing or shortness of breath (airway clearance/cough). 120 mL 2   ??? albuterol HFA 90 mcg/actuation inhaler Inhale 2 puffs every four (4) hours as needed for wheezing or shortness of breath. 24 g 1   ??? amLODIPine (NORVASC) 5 MG tablet Take 1 tablet (5 mg total) by mouth daily. Added per patient 90 tablet 0   ??? aspirin 325 MG tablet Take 325 mg by mouth daily.     ??? blood sugar diagnostic Strp by Other route Three (3) times a day. ACCU-CHEK Guide meter 300 strip 1   ??? budesonide-formoteroL (SYMBICORT) 160-4.5 mcg/actuation inhaler Inhale 2 puffs Two (2) times a day. 10.2 g 3   ??? cyclobenzaprine (FLEXERIL) 10 MG tablet Take 1 tablet by mouth three times daily as needed 90 tablet 0   ??? furosemide (LASIX) 20 MG tablet Take 1 tablet (20 mg total) by mouth daily as needed. 90 tablet 0   ??? gabapentin (NEURONTIN) 300 MG capsule Take 1 capsule (300 mg total) by mouth Three (3) times a day. 270 capsule 1   ??? insulin ASPART (NOVOLOG U-100 INSULIN ASPART) 100 unit/mL injection Take 1-20 units SQ QID AC and HS as directed 20 mL 1   ??? insulin glargine (BASAGLAR, LANTUS) 100 unit/mL (3 mL) injection pen Inject 0.24 mL (24 Units total) under the skin nightly. (Patient taking differently: Inject 20 Units under the skin nightly.) 21 mL 1   ??? ipratropium (ATROVENT) 0.02 % nebulizer solution Inhale 2.5 mL (500 mcg total) by nebulization four (4) times a day. 900 mL 1   ??? MEDICAL SUPPLY ITEM Accucheck Glide Glocumeter for home glucose checks. Check blood glucose ACHS. 1 Package 0   ??? mepolizumab (NUCALA) 100 mg/mL AtIn Inject the contents of 1 syringe (100 mg) under the  skin every twenty-eight (28) days. 1 mL 12   ??? miscellaneous medical supply Misc Nebulizer machine and tubing. Patients machine is 58 years old. 1 each 0   ??? OXYGEN-AIR DELIVERY SYSTEMS MISC Dose: 2-3 LPM     ??? posaconazole (NOXAFIL) 100 mg delayed released tablet Take 2 tablets (200 mg total) by mouth daily. 60 tablet 2 ??? pravastatin (PRAVACHOL) 40 MG tablet Take 1 tablet (40 mg total) by mouth daily with evening meal. 90 tablet 1   ??? sildenafiL, pulm.hypertension, (REVATIO) 20 mg tablet Take 1 tablet (20 mg total) by mouth Three (3) times a day. 270 tablet 0   ??? treprostiniL (TYVASO) 1.74 mg/2.9 mL (0.6 mg/mL) Nebu Inhale 18 mcg four (4) times a day.      ??? predniSONE (DELTASONE) 5 MG tablet Take 1 tablet (5 mg total) by mouth daily. 30 tablet 3     No current facility-administered medications for this visit.       Health Maintenance:     Health Maintenance   Topic Date Due   ??? HPV Cotest with Pap Smear (21-65)  Never done   ??? Pap Smear with Cotest HPV (21-65)  02/27/2001   ??? FIT-DNA Stool Test  Never done   ??? Zoster Vaccines (1 of 2) Never done   ??? DTaP/Tdap/Td Vaccines (2 - Td or Tdap) 07/07/2017   ??? Pneumococcal Vaccine 0-64 (2 - PCV) 01/03/2018   ??? Retinal Eye Exam  11/15/2019   ??? COVID-19 Vaccine (4 - Booster for Pfizer series) 01/16/2020   ??? Mammogram Start Age 68  02/29/2020   ??? Foot Exam  01/18/2021   ??? Urine Albumin/Creatinine Ratio  01/18/2021   ??? Hemoglobin A1c  04/21/2021   ??? Serum Creatinine Monitoring  06/08/2021   ??? Potassium Monitoring  06/08/2021   ??? COPD Spirometry  01/04/2022   ??? Hepatitis C Screen  Completed   ??? Influenza Vaccine  Completed       Immunizations:     Immunization History   Administered Date(s) Administered   ??? COVID-19 VACC,MRNA,(PFIZER)(PF) 06/11/2019, 07/02/2019, 11/21/2019   ??? INFLUENZA INJ MDCK PF, QUAD,(FLUCELVAX)(7MO AND UP EGG FREE) 01/19/2021   ??? INFLUENZA TIV (TRI) PF (IM) 01/02/2005, 12/14/2008   ??? Influenza Vaccine Quad (IIV4 PF) 32mo+ injectable 11/28/2012, 12/19/2014, 12/16/2015, 01/03/2017, 12/06/2017, 01/08/2019, 12/15/2019   ??? PNEUMOCOCCAL POLYSACCHARIDE 23 07/08/2007, 01/03/2017   ??? PPD Test 01/19/2015   ??? TdaP 07/08/2007     I have reviewed and (if needed) updated the patient's problem list, medications, allergies, past medical and surgical history, social and family history. Vital Signs:     Wt Readings from Last 3 Encounters:   04/19/21 94.8 kg (209 lb)   11/02/20 98 kg (216 lb)   07/20/20 97.1 kg (214 lb)     Temp Readings from Last 3 Encounters:   04/19/21 37 ??C (98.6 ??F) (Oral)   11/02/20 37.1 ??C (98.8 ??F) (Oral)   07/20/20 36.8 ??C (98.2 ??F) (Temporal)     BP Readings from Last 3 Encounters:   04/19/21 130/84   11/02/20 110/76   07/20/20 118/70     Pulse Readings from Last 3 Encounters:   04/19/21 70   11/02/20 87   07/20/20 83     Estimated body mass index is 29.15 kg/m?? as calculated from the following:    Height as of this encounter: 180.3 cm (5' 11).    Weight as of this encounter: 94.8 kg (209 lb).  Facility age limit for growth percentiles  is 20 years.      Objective:      General: Alert and oriented x3. Chronically ill-appearing. No acute distress.   HEENT:  Normocephalic.  Atraumatic. Conjunctiva and sclera normal. Audible nasal congestion. OP MMM without lesions. Yellow pharyngeal exudate.   Neck:  Supple. No thyroid enlargement. No adenopathy.   Heart:  Regular rate and rhythm. Normal S1, S2. No murmurs, rubs or gallops.   Lungs:  No respiratory distress.  Lungs diminished to auscultation, with scattered rhonchi. No wheezes,or rales. Oxygen in use.  GI/GU:  Soft, +BS, nondistended, non-TTP. No palpable masses or organomegaly.   Extremities: Mild pedal edema bilateral. Peripheral pulses normal.   Skin:  Warm, dry. No rash or lesions present.   Neuro:  Non-focal. No obvious weakness.  Psych:  Affect normal, eye contact good, speech clear and coherent.      ??  Noralyn Pick, FNP

## 2021-04-29 NOTE — Unmapped (Signed)
[facility] faxed form wanting Karen Barber's current phone, email, alternative #.  Karen Barber states she no longer needs their supplies.  Faxed her response to [employer].

## 2021-05-03 NOTE — Unmapped (Signed)
[facility] Refill Coordination Note    Specialty Medication(s) to be Shipped:   CF/Pulmonary: -Nucala 100mg /ml Inj  Other medication(s) to be shipped: No additional medications requested for fill at this time     Karen Barber, DOB: 31-Jul-1965  Phone: [phone] (home)     All above HIPAA information was verified with patient.     Was a Nurse, learning disability used for this call? No    Completed refill call assessment today to schedule patient's medication shipment from the [facility] [phone]).  All relevant notes have been reviewed.     Specialty medication(s) and dose(s) confirmed: Regimen is correct and unchanged.   Changes to medications: [name] reports no changes at this time.  Changes to insurance: No  New side effects reported not previously addressed with a pharmacist or physician: None reported  Questions for the pharmacist: No    Confirmed patient received a Conservation officer, historic buildings and a Surveyor, mining with first shipment. The patient will receive a drug information handout for each medication shipped and additional FDA Medication Guides as required.       DISEASE/MEDICATION-SPECIFIC INFORMATION        For CF patients: CF Healthwell Grant Active? No-not enrolled    SPECIALTY MEDICATION ADHERENCE     Medication Adherence    Patient reported X missed doses in the last month: 0  Specialty Medication: Nucala 100mg /ml Inj  Patient is on additional specialty medications: No  Patient is on more than two specialty medications: No  Informant: patient  Reliability of informant: reliable  Reasons for non-adherence: no problems identified  Confirmed plan for next specialty medication refill: delivery by pharmacy  Refills needed for supportive medications: not needed        Were doses missed due to medication being on hold? No    Nucala 100mg /ml Inj: 0 days of medicine on hand (Next Injection: 05/17/21)    REFERRAL TO PHARMACIST     Referral to the pharmacist: Not needed    [facility]     Shipping address confirmed in Epic.     Delivery Scheduled: Yes, Expected medication delivery date: 05/13/2021.     Medication will be delivered via Same Day Courier to the prescription address in Epic WAM.    Karen Barber   [facility] Specialty Technician

## 2021-05-05 DIAGNOSIS — B441 Other pulmonary aspergillosis: Principal | ICD-10-CM

## 2021-05-05 DIAGNOSIS — J418 Mixed simple and mucopurulent chronic bronchitis: Principal | ICD-10-CM

## 2021-05-05 DIAGNOSIS — D869 Sarcoidosis, unspecified: Principal | ICD-10-CM

## 2021-05-05 MED ORDER — POSACONAZOLE 100 MG TABLET,DELAYED RELEASE
ORAL_TABLET | Freq: Every day | ORAL | 2 refills | 30 days
Start: 2021-05-05 — End: ?

## 2021-05-05 MED ORDER — ALBUTEROL SULFATE HFA 90 MCG/ACTUATION AEROSOL INHALER
RESPIRATORY_TRACT | 1 refills | 0 days | Status: CP | PRN
Start: 2021-05-05 — End: 2022-05-05

## 2021-05-05 MED ORDER — ACCU-CHEK FASTCLIX LANCET DRUM
Freq: Four times a day (QID) | 1 refills | 0.00000 days | Status: CP
Start: 2021-05-05 — End: 2022-05-05

## 2021-05-05 MED ORDER — INSULIN GLARGINE (U-100) 100 UNIT/ML (3 ML) SUBCUTANEOUS PEN
Freq: Every evening | SUBCUTANEOUS | 1 refills | 87 days | Status: CP
Start: 2021-05-05 — End: 2022-05-05

## 2021-05-05 MED ORDER — TREPROSTINIL 1.74 MG/2.9 ML (0.6 MG/ML) SOLUTION FOR NEBULIZATION
Freq: Four times a day (QID) | RESPIRATORY_TRACT | 1 refills | 90 days | Status: CP
Start: 2021-05-05 — End: 2022-05-05

## 2021-05-05 MED ORDER — BUDESONIDE-FORMOTEROL HFA 160 MCG-4.5 MCG/ACTUATION AEROSOL INHALER
Freq: Two times a day (BID) | RESPIRATORY_TRACT | 3 refills | 31.00000 days
Start: 2021-05-05 — End: 2021-08-03

## 2021-05-06 MED ORDER — BUDESONIDE-FORMOTEROL HFA 160 MCG-4.5 MCG/ACTUATION AEROSOL INHALER
Freq: Two times a day (BID) | RESPIRATORY_TRACT | 3 refills | 31.00000 days
Start: 2021-05-06 — End: 2021-08-04

## 2021-05-06 NOTE — Unmapped (Signed)
Left vm to call us back concerning whether or not she plans on making an appt w/[facility] or if she is being followed by [facility]. Per Vernona Rieger and [facility]'s inbasket in Rx Request.

## 2021-05-09 MED ORDER — ACCU-CHEK FASTCLIX LANCET DRUM
Freq: Four times a day (QID) | 1 refills | 0 days | Status: CP
Start: 2021-05-09 — End: 2022-05-09

## 2021-05-09 NOTE — Unmapped (Signed)
Patient is requesting the following refill  Requested Prescriptions     Pending Prescriptions Disp Refills   ??? ACCU-CHEK FASTCLIX LANCET DRUM Misc 400 each 1     Sig: 1 each by Other route Four (4) times a day (before meals and nightly).       Recent Visits  Date Type Provider Dept   04/19/21 Office Visit Keri Rosita Fire, FNP [place] [facility]   11/02/20 Office Visit Keri Rosita Fire, FNP [place] [facility]   Showing recent visits within past 365 days with a meds authorizing provider and meeting all other requirements  Future Appointments  Date Type Provider Dept   07/18/21 Appointment Keri Rosita Fire, FNP [place] [facility]   Showing future appointments within next 365 days with a meds authorizing provider and meeting all other requirements       Labs:   A1c:   Hemoglobin A1C (%)   Date Value   01/19/2021 8.0 (A)   06/20/2014 10.0 (H)

## 2021-05-11 NOTE — Unmapped (Signed)
Seen 04/19/2021

## 2021-05-12 DIAGNOSIS — J329 Chronic sinusitis, unspecified: Principal | ICD-10-CM

## 2021-05-12 MED ORDER — TREPROSTINIL 1.74 MG/2.9 ML (0.6 MG/ML) SOLUTION FOR NEBULIZATION
Freq: Four times a day (QID) | RESPIRATORY_TRACT | 1 refills | 90 days | Status: CP
Start: 2021-05-12 — End: 2022-05-12

## 2021-05-12 MED ORDER — BUDESONIDE-FORMOTEROL HFA 160 MCG-4.5 MCG/ACTUATION AEROSOL INHALER
Freq: Two times a day (BID) | RESPIRATORY_TRACT | 3 refills | 31.00000 days
Start: 2021-05-12 — End: 2021-08-10

## 2021-05-12 MED ORDER — LEVOFLOXACIN 500 MG TABLET
ORAL_TABLET | Freq: Every day | ORAL | 0 refills | 7 days | Status: CP
Start: 2021-05-12 — End: 2021-05-19

## 2021-05-12 NOTE — Unmapped (Addendum)
Resend [employer] with Diag code to [facility].    Patient is requesting the following refill  Requested Prescriptions      No prescriptions requested or ordered in this encounter       Recent Visits  Date Type Provider Dept   04/19/21 Office Visit Keri Rosita Fire, FNP [place] [facility]   11/02/20 Office Visit Keri Rosita Fire, FNP [place] [facility]   Showing recent visits within past 365 days with a meds authorizing provider and meeting all other requirements  Future Appointments  Date Type Provider Dept   07/18/21 Appointment Keri Rosita Fire, FNP [place] [facility]   Showing future appointments within next 365 days with a meds authorizing provider and meeting all other requirements       Local [facility] said this med has to go through [facility] pharmacy.  Called [phone].  RX # [id number] store [phone].  Left detailed message with DX I27.20.  Was covered but has to go through [employer].

## 2021-05-13 MED FILL — NUCALA 100 MG/ML SUBCUTANEOUS AUTO-INJECTOR: SUBCUTANEOUS | 28 days supply | Qty: 1 | Fill #10

## 2021-05-13 NOTE — Unmapped (Signed)
Patient has requested medication refills but we have not been able to reach her to determine if she plans to continue care at [facility] or [place]. Refill refused at this time until she contacts our office.

## 2021-05-16 NOTE — Unmapped (Signed)
Karen Barber,    I spoke with this patient and she stated that is seeing [facility]. Thanks

## 2021-05-17 NOTE — Unmapped (Addendum)
Spoke to [place] at [employer] [phone], fax [phone].  Originally Biochemist, clinical for [employer] to [employer].  They can't get that med and asked that we transfer to [facility] pharmacy.  Adelina Mings said with [employer] she has to use [facility].  Adelina Mings said the pulmonologist needs to send them a referral (she said they should have the forms) and a RX request for the [place].  Jackie notified.  She has an appt with pulmonologist this week and will let them know.

## 2021-05-26 NOTE — Unmapped (Signed)
[place] Assessment of Medications Program (CAMP)  RECRUITMENT SUMMARY NOTE   COPD Inhaler Education      CAMP is a team of pharmacists and pharmacy technicians within [facility] that supports patients and providers.     Patient was identified for CAMP services based on criteria including, 58 years of age or older, with a recent managing provider visit, and   Inhaler education needed     A letter has been sent explaining program services.        Jerolyn Centerenisa  Georgianne Gritz, CPhT   Certified Pharmacy Technician   [place] Assessment of Medications Program (CAMP)   P [phone]) [phone], F [phone]

## 2021-06-02 DIAGNOSIS — D869 Sarcoidosis, unspecified: Principal | ICD-10-CM

## 2021-06-02 DIAGNOSIS — J449 Chronic obstructive pulmonary disease, unspecified: Principal | ICD-10-CM

## 2021-06-02 DIAGNOSIS — B441 Other pulmonary aspergillosis: Principal | ICD-10-CM

## 2021-06-02 MED ORDER — CYCLOBENZAPRINE 10 MG TABLET
ORAL_TABLET | Freq: Three times a day (TID) | ORAL | 1 refills | 30 days | Status: CP | PRN
Start: 2021-06-02 — End: 2022-06-03

## 2021-06-02 MED ORDER — INSULIN GLARGINE (U-100) 100 UNIT/ML (3 ML) SUBCUTANEOUS PEN
Freq: Every evening | SUBCUTANEOUS | 1 refills | 87 days
Start: 2021-06-02 — End: 2022-06-02

## 2021-06-02 MED ORDER — ALBUTEROL SULFATE 2.5 MG/3 ML (0.083 %) SOLUTION FOR NEBULIZATION
Freq: Four times a day (QID) | RESPIRATORY_TRACT | 1 refills | 30 days | Status: CP | PRN
Start: 2021-06-02 — End: 2022-06-02

## 2021-06-02 MED ORDER — TREPROSTINIL 1.74 MG/2.9 ML (0.6 MG/ML) SOLUTION FOR NEBULIZATION
Freq: Four times a day (QID) | RESPIRATORY_TRACT | 1 refills | 90 days
Start: 2021-06-02 — End: 2022-06-02

## 2021-06-02 MED ORDER — POSACONAZOLE 100 MG TABLET,DELAYED RELEASE
ORAL_TABLET | Freq: Every day | ORAL | 2 refills | 30 days
Start: 2021-06-02 — End: 2022-06-03

## 2021-06-02 NOTE — Unmapped (Signed)
[facility] Refill Coordination Note    Specialty Medication(s) to be Shipped:   CF/Pulmonary: -Nucala 100mg /ml Inj  Other medication(s) to be shipped: No additional medications requested for fill at this time     Karen Barber, DOB: 1965/04/26  Phone: [phone] (home)     All above HIPAA information was verified with patient.     Was a Nurse, learning disability used for this call? No    Completed refill call assessment today to schedule patient's medication shipment from the [facility] [phone]).  All relevant notes have been reviewed.     Specialty medication(s) and dose(s) confirmed: Regimen is correct and unchanged.   Changes to medications: [name] reports no changes at this time.  Changes to insurance: No  New side effects reported not previously addressed with a pharmacist or physician: None reported  Questions for the pharmacist: No    Confirmed patient received a Conservation officer, historic buildings and a Surveyor, mining with first shipment. The patient will receive a drug information handout for each medication shipped and additional FDA Medication Guides as required.       DISEASE/MEDICATION-SPECIFIC INFORMATION        For CF patients: CF Healthwell Grant Active? No-not enrolled    SPECIALTY MEDICATION ADHERENCE     Medication Adherence    Patient reported X missed doses in the last month: 0  Specialty Medication: Nucala 100mg /ml Inj  Patient is on additional specialty medications: No  Patient is on more than two specialty medications: No  Informant: patient  Reliability of informant: reliable  Reasons for non-adherence: no problems identified  Confirmed plan for next specialty medication refill: delivery by pharmacy  Refills needed for supportive medications: not needed        Were doses missed due to medication being on hold? No    Nucala 100mg /ml Inj: 0 days of medicine on hand (Next Injection: 06/12/21)    REFERRAL TO PHARMACIST     Referral to the pharmacist: Not needed    [facility]     Shipping address confirmed in Epic.     Delivery Scheduled: Yes, Expected medication delivery date: 06/09/2021.     Medication will be delivered via Same Day Courier to the prescription address in Epic WAM.    Cova Knieriem P Allena Katz   [facility] Specialty Technician

## 2021-06-02 NOTE — Unmapped (Signed)
Patient is requesting the following refill  Requested Prescriptions     Pending Prescriptions Disp Refills   ??? cyclobenzaprine (FLEXERIL) 10 MG tablet 90 tablet 1     Sig: Take 1 tablet (10 mg total) by mouth Three (3) times a day as needed.     Signed Prescriptions Disp Refills   ??? albuterol 2.5 mg /3 mL (0.083 %) nebulizer solution 360 mL 1     Sig: Inhale 3 mL (2.5 mg total) by nebulization every six (6) hours as needed for wheezing or shortness of breath (airway clearance/cough).     Authorizing Provider: Loran Senters     Ordering User: Barbaraann Boys     Refused Prescriptions Disp Refills   ??? insulin glargine (BASAGLAR, LANTUS) 100 unit/mL (3 mL) injection pen 21 mL 1     Sig: Inject 0.24 mL (24 Units total) under the skin nightly.     Refused By: Barbaraann Boys     Reason for Refusal: Request already responded to by other means (e.g. phone or fax)   ??? treprostiniL (TYVASO) 1.74 mg/2.9 mL (0.6 mg/mL) Nebu 10.8 mL 1     Sig: 3 puffs (18 mcg total) by via Home Nebulizer route four (4) times a day.     Refused By: Barbaraann Boys     Reason for Refusal: Request already responded to by other means (e.g. phone or fax)       Recent Visits  Date Type Provider Dept   04/19/21 Office Visit Keri Rosita Fire, FNP [facility]   11/02/20 Office Visit Keri Rosita Fire, FNP [place]   Showing recent visits within past 365 days with a meds authorizing provider and meeting all other requirements  Future Appointments  Date Type Provider Dept   07/18/21 Appointment Keri Rosita Fire, FNP [place] [facility]   Showing future appointments within next 365 days with a meds authorizing provider and meeting all other requirements       Labs: Not applicable this refill

## 2021-06-02 NOTE — Unmapped (Signed)
Patient is requesting the following refill  Requested Prescriptions     Pending Prescriptions Disp Refills   ??? posaconazole (NOXAFIL) 100 mg delayed released tablet 60 tablet 2     Sig: Take 2 tablets (200 mg total) by mouth daily.       Recent Visits  Date Type Provider Dept   04/19/21 Office Visit Keri Rosita Fire, FNP [facility]   11/02/20 Office Visit Keri Rosita Fire, FNP [facility]   Showing recent visits within past 365 days with a meds authorizing provider and meeting all other requirements  Future Appointments  Date Type Provider Dept   06/08/21 Appointment Melanee Left, MD [facility]   07/18/21 Appointment Keri Rosita Fire, FNP [place] [facility]   Showing future appointments within next 365 days with a meds authorizing provider and meeting all other requirements       Labs: Not applicable this refill

## 2021-06-06 NOTE — Unmapped (Signed)
[place] Assessment of Medications Program (CAMP)  RECRUITMENT SUMMARY NOTE   COPD Inhaler Education      Patient was outreached for [employer]. Patient scheduled.     mebane    Video Visit Scheduling Checklist:   COS has verified the following with the patient/patient representative:   [x]  Verify access to device with camera (smartphone/tablet or computer with webcam)   [x]  MyChart > Verify patient/patient representative is able access MyChart  (add MyChart in appt note)  []  Link > Verify e-mail address AND/OR cell phone number with patient    []  Patient wants link sent to cell phone (add text link in appt note)   []  Patient wants link sent to e-mail  (add e-mail link in appt note)  [x]  Verify patients current inhalers on hand (albuterol,symbicort,trepostil)  Troubleshooting (MyChart access):  []  Provide patient with [facility] contact information for additional support- [phone]    Camp Scheduled      Med Pre Visit Patient Level Data    PCP confirmed on care team?: Yes  How many pharmacies do you obtain medications from?: 1  What type of pharmacies do you use?: Retail Pharmacy  Who helps you with your medications?: Manage Myself  Do you have issues affording the cost of any of your medications?: No  DO you have fills for 90 day supplies?: Yes (Comment: some 30 d/s)  Are there any other things you hope to discuss with your pharmacist during your visit?: No  Additional comments for Pre Visit Planning: Inhalers: Symbicort, albuterol,treposil         Med Baseline Patient Level Data    Do you have any allergies to medications?: No  What tools do you use to help you manage your medications?: Pill box  How often do you forget to take your medications?: Rarely  In the past week, how many days have you missed a dose of medication?: 1             Jerolyn Center, CPhT   Certified Pharmacy Technician   [place] Assessment of Medications Program (CAMP)   P [phone], F [phone]

## 2021-06-07 NOTE — Unmapped (Unsigned)
ID Clinic Note - Follow-up Visit    ASSESSMENT AND PLAN:  56yoF w/ sarcoidosis and h/o chronic cavitary pulmonary aspergillosis in 2010 s/p LUL lobectomy w/ VATS on 12/03/14 who had frequent hospitalizations in 2018 for respiratory issues/infections, including in 09/2016 when she was found to have a L apical thick-walled cavitary lesion w/ adjacent lung parenchymal abnormalities and sputum culture positive for Aspergillus [place] and Achromobacter spp. Initially treated with voriconazole for chronic pulmonary aspergillosis. This was transitioned to posaconazole to reduce potential toxicities after discussing risks and benefits of stopping therapy completely as it was unlikely to be curative and she is not a transplant candidate due to anatomical concerns. She has continued on this for several years now without adverse effect, so potential benefits of controlling infection have seemed to outweigh risks in shared decision-making discussions.    Primary issues over the last 2-3 years have been easy fatigability, chronic DOE, cough, and increased O2 requirement. She has established care with [facility], and they have started her on inhaled treprostinil and amlodipine for pulmonary hypertension, which was noted to be far out of proportion with her lung disease per RHC (thought to have primary PAH as well). She has started to notice some small improvements in her breathing since then. She has also started Pulmonary Rehab in the last few weeks. Remains on prednisone 5mg  daily, but no other immunosuppression. Has not noticed any side effects from the posaconazole. Has not had a level checked since April 2021, so she will do this on Friday.     Today:  - CONTINUE posaconazole 200mg  daily. Levels have been more than therapeutic, and she is not experiencing side effects. Discuss at each visit risks and benefits of trial off of this medication, elected to continue for now.   - LFTs done 03/2021 and wnl. Repeat posaconazole trough today (has always been therapeutic).  - Continue to follow with [facility] re: sarcoid, pulm HTN, asthma. 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  - Worsening respiratory symptoms w/ mediastinal lymphadenopathy and fibrosis progressive on 01/2018 CT concerning for progression of sarcoidosis  - PET CT 08/2018 with metabolically active pulm fibrosis and hilar/mediastional LNs  - Transitioned care to [facility] from [facility] 10/2019  - Now w/ severe PAH on RHC done at [facility] (previously mild), out of proportion with lung disease  - Not a lung transplant candidate 2/2 anatomy after past surgery  ??  Chronic cavitary pulmonary aspergillosis, first diagnosed 2010  - previously treated with multiple courses of voriconazole  - 2016 cultures with Aspergillus fumigatus  - s/p LUL lobectomy/VATS 12/03/14 after episodes of hemoptysis followed by voriconazole 11/20/14-04/2015   - New L apical thick walled cavitary lesion and adjacent lung parenchymal abnormalities 09/08/16 in setting of 1 month of worsening pulmonary??infectious??symptoms, leukocytosis (on steroids). Sputum cultures: Aspergillus Luxembourg, OPF. Treated with vanc/cefepime/flagyl --> Augmentin x 2 weeks, voriconazole  - Re-presented 09/25/16 w/ increasing cough, sputum production, and fatigue  - CT 09/25/16 with new mycetoma in remaining left lung and worsening consolidation. LRCx Aspergillus [place]  - Continued on voriconazole since July 2018, troughs mostly therapeutic or just slightly subtherapeutic.  - 02/13/2018 CT: decreased size of L apical cavity soft tissue density  - Posaconazole 03/2018-present (dose decreased  based on levels to 200mg  daily)  ??  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  Pertinent past Infections  - H/o Achromobacter pneumonia 11/18/14, treated w meropenem x 14 d; 09/25/16, treated with 4 weeks meropenem  - H/o Stenotrophomonas (S: minocycline, I: levoflox R: bactrim, ceftaz)??PNA 12/03/14 treated with minocycline x 14d  - Hepatitis C Ab-positive, VL neg  - Nutritionally variant Strep bacteremia, 06/2014  ??  ??  Education and counseling took 30 minutes of today's visit.  ??  Duanne Limerick, MD, MHS  Assistant Professor, [facility] Division of Infectious Diseases  ??  [facility]   [address], 5th floor  [place], [place] [address]   Phone: [phone]   Fax: [phone]   ??  _____________________________________________________________________  ??  ??  CC: F/u of mycetoma, chronic cavitary pulmonary aspergillosis  ??  HPI: ??56yoF w/ h/o sarcoidosis, severe eosinophilic asthma, bronchiectasis, chronic cavitary pulmonary aspergillosis s/p resection in 2016 currently on posaconazole for suppressive therapy, and positive M.fortuitum sputum cultures. Initially had response to Nucala, but only temporarily, so asthma not thought to be driving cause of her chronic symptoms. Based on CT and PET scan findings in 2020, active pulm sarcoid thought to be primary driver of symptoms. However, since then has established care with [facility] and had a repeat RHC late 2021 that showed severe PAH out of proportion with lung disease, so thought to have primary PAH as well.     Last visit with me was a telehealth visit on 04/2020.   - COVID-19 infection 03/24/21. Has had congestion since then  - Following with PCP Etheleen Nicks  - Following with Pulm at [facility] - last seen 02/09/21. On Tyvaso and silenafil. Had lost 27lb at that visit.   - Pulm Rehab***      Past Medical History:   Diagnosis Date   ??? 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 Breast injury     lung surg on left incision under breast sept 2016   ??? Caregiver burden     parents    ??? Hepatitis C antibody test positive 2016    repeatedly negative HCV RNA, indicating clearance of infection w/o treatment   ??? Hyperlipidemia    ??? Hypertension    ??? 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 On home O2 2008    per Dr Mal Amabile note from 02/21/2017   ??? Pulmonary aspergillosis (CMS-HCC) 2016    A.fumigatus in 2016, prior to LUL lobectomy. A.niger from sputum 08/2016-09/2016.   ??? 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 Sarcoidosis 1995   ??? Type 2 diabetes mellitus with hyperglycemia (CMS-HCC) 07/27/2014   ??? Visual impairment     glasses       Past Surgical History:   Procedure Laterality Date   ??? LUNG LOBECTOMY     ??? PR AMPUTATION TOE,MT-P JT Right 12/12/2019    Procedure: Right foot fifth toe amputation at the metatarsophalangeal joint level;  Surgeon: Britt Bottom, DPM;  Location: MAIN OR [facility];  Service: Vascular   ??? PR AMPUTATION TOE,MT-P JT Right 06/16/2020    Procedure: Right foot amputation of 3rd toe at mpj level;  Surgeon: Britt Bottom, DPM;  Location: MAIN OR [facility];  Service: Vascular   ??? PR RIGHT HEART CATH O2 SATURATION & CARDIAC OUTPUT N/A 04/29/2019    Procedure: Right Heart Catheterization;  Surgeon: Rosana Hoes, MD;  [facility];  Service: Cardiology   ??? PR THORACOSCOPY SURG TOT PULM DECORT Left 12/14/2014    Procedure: THORACOSCOPY SURG; W/TOT PULM DECORTIC/PNEUMOLYS;  Surgeon: Evert Kohl, MD;  Location: MAIN OR [facility];  Service: Cardiothoracic   ??? PR THORACOSCOPY W/THERA WEDGE RESEXN INITIAL UNILAT Left 12/03/2014    Procedure: THORACOSCOPY, SURGICAL; WITH THERAPEUTIC WEDGE RESECTION (EG, MASS, NODULE) INITIAL UNILATERAL;  Surgeon: Evert Kohl, MD;  Location: MAIN OR [facility];  Service: Cardiothoracic       No Known Allergies    Current Outpatient Medications   Medication Sig Dispense Refill   ??? ACCU-CHEK FASTCLIX LANCET DRUM Misc 1 each by Other route Four (4) times a day (before meals and nightly). 400 each 1   ??? ACCU-CHEK GUIDE GLUCOSE METER Misc 1 Device by Other route Four (4) times a day (before meals and nightly). AS DIRECTED 1 kit 0   ??? albuterol 2.5 mg /3 mL (0.083 %) nebulizer solution Inhale 3 mL (2.5 mg total) by nebulization every six (6) hours as needed for wheezing or shortness of breath (airway clearance/cough). 360 mL 1   ??? albuterol HFA 90 mcg/actuation inhaler Inhale 2 puffs every four (4) hours as needed for wheezing or shortness of breath. 54 g 1   ??? amLODIPine (NORVASC) 5 MG tablet Take 1 tablet (5 mg total) by mouth daily. Added per patient 90 tablet 0   ??? aspirin 325 MG tablet Take 325 mg by mouth daily.     ??? blood sugar diagnostic Strp by Other route Three (3) times a day. ACCU-CHEK Guide meter 300 strip 1   ??? budesonide-formoteroL (SYMBICORT) 160-4.5 mcg/actuation inhaler Inhale 2 puffs Two (2) times a day. 10.2 g 3   ??? cyclobenzaprine (FLEXERIL) 10 MG tablet Take 1 tablet (10 mg total) by mouth Three (3) times a day as needed. 90 tablet 1   ??? furosemide (LASIX) 20 MG tablet Take 1 tablet (20 mg total) by mouth daily as needed. 90 tablet 0   ??? gabapentin (NEURONTIN) 300 MG capsule Take 1 capsule (300 mg total) by mouth Three (3) times a day. 270 capsule 1   ??? insulin ASPART (NOVOLOG U-100 INSULIN ASPART) 100 unit/mL injection Take 1-20 units SQ QID AC and HS as directed 20 mL 1   ??? insulin glargine (BASAGLAR, LANTUS) 100 unit/mL (3 mL) injection pen Inject 0.24 mL (24 Units total) under the skin nightly. 21 mL 1   ??? ipratropium (ATROVENT) 0.02 % nebulizer solution Inhale 2.5 mL (500 mcg total) by nebulization four (4) times a day. 900 mL 1   ??? MEDICAL SUPPLY ITEM Accucheck Glide Glocumeter for home glucose checks. Check blood glucose ACHS. 1 Package 0   ??? mepolizumab (NUCALA) 100 mg/mL AtIn Inject the contents of 1 syringe (100 mg) under the skin every twenty-eight (28) days. 1 mL 12   ??? miscellaneous medical supply Misc Nebulizer machine and tubing. Patients machine is 58 years old. 1 each 0   ??? OXYGEN-AIR DELIVERY SYSTEMS MISC Dose: 2-3 LPM     ??? posaconazole (NOXAFIL) 100 mg delayed released tablet Take 2 tablets (  200 mg total) by mouth daily. 60 tablet 2   ??? sildenafiL, pulm.hypertension, (REVATIO) 20 mg tablet Take 1 tablet (20 mg total) by mouth Three (3) times a day. 270 tablet 0   ??? treprostiniL (TYVASO) 1.74 mg/2.9 mL (0.6 mg/mL) Nebu 3 puffs (18 mcg total) by via Home Nebulizer route four (4) times a day. 10.8 mL 1     No current facility-administered medications for this visit.       Social history:  Reviewed and unchanged except as noted in the HPI.    Family History   Problem Relation Age of Onset   ??? Diabetes Father    ??? Diabetes Brother    ??? Diabetes Maternal Aunt    ??? Sarcoidosis Maternal Aunt    ??? Diabetes Maternal Uncle    ??? Diabetes Paternal Aunt    ??? Diabetes Paternal Uncle        ROS: 12 systems reviewed and negative except as per HPI.     PE:  Telephone visit only - no vitals or exam.    Labs:    Personally reviewed and interpreted labs from 02/2020 at [facility] - normal LFTs.      Drug monitoring:  Posa levels   - 05/22/18: 2,552 --> dose decreased to 200mg  daily  - 08/28/18: 1,636 --> continue 200mg  daily  - 05/30/19 (random): 1,874 --> no change  - 07/10/19 (trough): 2,058 ---> no change as she is tolerating well.    Lab Results   Component Value Date    ALKPHOS 73 04/19/2021    BILITOT 0.5 04/19/2021    BILIDIR <0.10 05/30/2019    PROT 7.3 04/19/2021    ALBUMIN 3.7 04/19/2021    ALT 12 04/19/2021    AST 16 04/19/2021       Microbiology:  2021:   12/12/19: R foot 5th toe bone - enterobacter cloacae, serratia marcesccens  08/22/19: Sputum  - AFB neg  - Fungal culture with multiple morphologies of mould  - LRCx not processed    2020:  04/25/18: sputum AFB cx +mycobacterium fortuitum    2019:  02/14/18: OPF, fungal neg, AFB NGTD; galactomannan <0.5, fungitell neg  9/19, 9/29: AFB cx +mycobacterium fortuitum  Mycobacterium fortuitum group       MIC SUSCEPTIBILITY RESULT     Amikacin 2  Susceptible     Cefoxitin 16  Susceptible     Ciprofloxacin 0.5  Susceptible     Clarithromycin >16  Resistant1     Doxycycline 4  Intermediate     Imipenem 8  Intermediate2     Linezolid 2  Susceptible     Minocycline 2  Intermediate     Moxifloxacin 0.25  Susceptible     Tigecycline 0.03  No Interpretation     Trimethoprim + Sulfamethoxazole 0.25  Susceptible      2018:  02/19/17: OPF, fungal neg, AFB neg  7/12 LRCx: Aspergillus [place], 3+ Achromobacter species, 3+ Enterococcus faecium  7/10 Sputum fungal culture: Aspergillus [place]  7/9 LRCx: Aspergillus [place], OP flora, Achromobacter spp.    AFB smear x3 negative; cx NGTD      Imaging:   No recent imaging.

## 2021-06-09 ENCOUNTER — Ambulatory Visit: Admit: 2021-06-09 | Discharge: 2021-06-10 | Payer: MEDICARE

## 2021-06-09 MED FILL — NUCALA 100 MG/ML SUBCUTANEOUS AUTO-INJECTOR: SUBCUTANEOUS | 28 days supply | Qty: 1 | Fill #11

## 2021-06-10 MED ORDER — TREPROSTINIL 1.74 MG/2.9 ML (0.6 MG/ML) SOLUTION FOR NEBULIZATION
Freq: Four times a day (QID) | RESPIRATORY_TRACT | 1 refills | 90 days | Status: CP
Start: 2021-06-10 — End: 2022-06-10

## 2021-06-13 MED ORDER — POSACONAZOLE 100 MG TABLET,DELAYED RELEASE
ORAL_TABLET | Freq: Every day | ORAL | 2 refills | 30 days
Start: 2021-06-13 — End: 2022-06-14

## 2021-06-14 ENCOUNTER — Ambulatory Visit: Admit: 2021-06-14 | Payer: MEDICARE

## 2021-06-17 NOTE — Unmapped (Signed)
Keep getting request for [employer].  This med goes to specialty pharmacy and needs to be done  by [facility] pulmonologist.  [name] states she already got RX this week.

## 2021-06-23 NOTE — Unmapped (Unsigned)
[place] [facility] ([facility]) Clinic - COPD Best Practice Advisory (BPA) Alert Clinic    To patients reading this note: Please be advised that the primary purpose of this note is for [place] to keep track of your care and communicate with other members of your medical team. These suggested changes are intended to enhance the overall care you receive.  Approved changes will be communicated to you by your care team.     Karen Barber, a 56 y.o. female who presents for an initial real-time audio and video visit with the CAMP Clinic as part of the COPD BPA Clinic for inhaler teaching.   PCP: Noralyn Pick, FNP    Recommendations to provider:  ***     ASSESSMENT & PLAN     COPD    Patient reported treatment: see table below in subjective & objective     Assessment / Plan  Goal of visit to reduce exacerbations and symptoms through inhaler education.  Specific recommendations to provider noted above  Educated patient on proper inhaler use technique and indication for *** by using {BPA Clinic Education Tools:70601}. Patient was able to verbalize understanding and all questions answered at this time. BPA alert addressed within chart   Reviewed importance medication adherence with patient   Determined if affordability of and access to medications is a concern for the patient. ***  ***Assessed necessary information to complete MMRC questionnaire. BPA alert addressed    ***Identified that patient could benefit from a rescue inhaler, see recommendations to provider section above     ***Spirometry needed? Copy statement into recommendations section: Patient identified as having a need for spirometry through a BPA. As a reminder, this BPA triggers for COPD registry patients that do not have a documented spirometry completed within the last 5 years. Recommend completion of in office spirometry or referral to facility capable of completing spirometry to confirm COPD diagnosis/severity, track worsening of disease, and satisfy the BPA.     _____________________________________________________  {    Coding tips - Do not edit this text, it will delete upon signing of note!    Telephone visits [phone] for Physicians and APP???s and [phone] for Non- Physician Clinicians)- Only use minutes on the phone to determine level of service.    Video visits [phone]) - Use both minutes on video and pre/post minutes to determine level of service.       :75688}  The patient reports they are currently: {patient location:81390}. I spent *** minutes on the {phone audio video visit:67489} with the patient on the date of service. I spent an additional *** minutes on pre- and post-visit activities on the date of service.     The patient was physically located in [place] or a state in which I am permitted to provide care. The patient and/or parent/guardian understood that s/he may incur co-pays and cost sharing, and agreed to the telemedicine visit. The visit was reasonable and appropriate under the circumstances given the patient's presentation at the time.    The patient and/or parent/guardian has been advised of the potential risks and limitations of this mode of treatment (including, but not limited to, the absence of in-person examination) and has agreed to be treated using telemedicine. The patient's/patient's family's questions regarding telemedicine have been answered.     If the visit was completed in an ambulatory setting, the patient and/or parent/guardian has also been advised to contact their provider???s office for worsening conditions, and seek emergency medical treatment and/or  call 911 if the patient deems either necessary.        {campcomplete:63036}    Future scheduled follow up visits include the following. Notes and recommendations from today's visit {will/will not:32628} be {fax vs epic:39716} to Noralyn Pick, FNP  Future Appointments   Date Time Provider Department Center   06/28/2021 10:00 AM Marlow Baars, PharmD [facility]   06/29/2021 11:30 AM Melanee Left, MD [facility]   07/18/2021  9:20 AM Keri Rosita Fire, FNP [facility]   07/22/2021 10:20 AM [facility]       ***     Subjective   SUBJECTIVE & OBJECTIVE:      COPD Medications:    Drug Class Medication   Rescue Inhaler {COPD Rescue Meds Ms:63952} {inhaler/nebulizer:61785}   Rescue Inhaler {COPD Rescue Meds Ms:63952} {inhaler/nebulizer:61785}   Maintenance Inhaler  (LABA) {CAMP COPD TOC LABAs:62126} {inhaler/nebulizer:61785}   Maintenance Inhaler  (LAMA) {CAMPTOC COPD LAMAs:62127} {inhaler/nebulizer:61785}   Maintenance Inhaler  (LABA+LAMA) {COPD LABA+LAMA Ms:64328} {inhaler/nebulizer:61785}   Maintenance Inhaler  (ICS Combo) {CAMP COPD ICS Combo Ms:63999}  {inhaler/nebulizer:61785}     Affordability:  Patient states inhalers listed above {Actions; are/are not:16769} affordable to them at the time of this visit. ***    Adherence:   Patient {Blank single:19197::denies,reports,***} missed doses of ***. Reason(s) for nonadherence: {Adherence barriers _cbf:52877::There are no barriers to adherence identified at this time.}    MMRC  Most recent MMRC dyspnea scale: 2 Last MMRC date: 04/19/2021     HEALTH MAINTENANCE  Immunizations:  Immunization History   Administered Date(s) Administered    COVID-19 VACC,MRNA,(PFIZER)(PF) 06/11/2019, 07/02/2019, 11/21/2019    INFLUENZA INJ MDCK PF, QUAD,(FLUCELVAX)(60MO AND UP EGG FREE) 01/19/2021    INFLUENZA TIV (TRI) PF (IM) 01/02/2005, 12/14/2008    Influenza Vaccine Quad (IIV4 PF) 63mo+ injectable 11/28/2012, 12/19/2014, 12/16/2015, 01/03/2017, 12/06/2017, 01/08/2019, 12/15/2019    PNEUMOCOCCAL POLYSACCHARIDE 23 07/08/2007, 01/03/2017    PPD Test 01/19/2015    TdaP 07/08/2007       Smoking:  Social History     Tobacco Use   Smoking Status Former    Packs/day: 0.50    Years: 9.00    Pack years: 4.50    Types: Cigarettes    Quit date: 05/03/1987    Years since quitting: 34.1   Smokeless Tobacco Never        Current Outpatient Medications on File Prior to Visit   Medication Sig Dispense Refill    ACCU-CHEK FASTCLIX LANCET DRUM Misc 1 each by Other route Four (4) times a day (before meals and nightly). 400 each 1    ACCU-CHEK GUIDE GLUCOSE METER Misc 1 Device by Other route Four (4) times a day (before meals and nightly). AS DIRECTED 1 kit 0    albuterol 2.5 mg /3 mL (0.083 %) nebulizer solution Inhale 3 mL (2.5 mg total) by nebulization every six (6) hours as needed for wheezing or shortness of breath (airway clearance/cough). 360 mL 1    albuterol HFA 90 mcg/actuation inhaler Inhale 2 puffs every four (4) hours as needed for wheezing or shortness of breath. 54 g 1    amLODIPine (NORVASC) 5 MG tablet Take 1 tablet (5 mg total) by mouth daily. Added per patient 90 tablet 0    aspirin 325 MG tablet Take 325 mg by mouth daily.      blood sugar diagnostic Strp by Other route Three (3) times a day. ACCU-CHEK Guide meter 300  strip 1    budesonide-formoteroL (SYMBICORT) 160-4.5 mcg/actuation inhaler Inhale 2 puffs Two (2) times a day. 10.2 g 3    cyclobenzaprine (FLEXERIL) 10 MG tablet Take 1 tablet (10 mg total) by mouth Three (3) times a day as needed. 90 tablet 1    furosemide (LASIX) 20 MG tablet Take 1 tablet (20 mg total) by mouth daily as needed. 90 tablet 0    gabapentin (NEURONTIN) 300 MG capsule Take 1 capsule (300 mg total) by mouth Three (3) times a day. 270 capsule 1    insulin ASPART (NOVOLOG U-100 INSULIN ASPART) 100 unit/mL injection Take 1-20 units SQ QID AC and HS as directed 20 mL 1    insulin glargine (BASAGLAR, LANTUS) 100 unit/mL (3 mL) injection pen Inject 0.24 mL (24 Units total) under the skin nightly. 21 mL 1    ipratropium (ATROVENT) 0.02 % nebulizer solution Inhale 2.5 mL (500 mcg total) by nebulization four (4) times a day. 900 mL 1    MEDICAL SUPPLY ITEM Accucheck Glide Glocumeter for home glucose checks. Check blood glucose ACHS. 1 Package 0 mepolizumab (NUCALA) 100 mg/mL AtIn Inject the contents of 1 syringe (100 mg) under the skin every twenty-eight (28) days. 1 mL 12    miscellaneous medical supply Misc Nebulizer machine and tubing. Patients machine is 58 years old. 1 each 0    OXYGEN-AIR DELIVERY SYSTEMS MISC Dose: 2-3 LPM      posaconazole (NOXAFIL) 100 mg delayed released tablet Take 2 tablets (200 mg total) by mouth daily. 60 tablet 2    sildenafiL, pulm.hypertension, (REVATIO) 20 mg tablet Take 1 tablet (20 mg total) by mouth Three (3) times a day. 270 tablet 0    treprostiniL (TYVASO) 1.74 mg/2.9 mL (0.6 mg/mL) Nebu 3 puffs (18 mcg total) by via Home Nebulizer route four (4) times a day. 10.8 mL 1     No current facility-administered medications on file prior to visit.

## 2021-06-27 NOTE — Unmapped (Signed)
[facility] ([facility])  RECRUITMENT SUMMARY NOTE   COPD Inhaler Education      Patient was outreached to remind about upcoming CAMP appointment. Patient confirmed appointment.      Video Visit Reminder Checklist:   COS has verified the following with the patient/patient representative:   [x]  Verify access to device with camera (laptop)   [x]  Verify patient/patient representative is able access MyChart   []  Verify patient/patient representative is able access link (document text/email here)  []  Verify e-mail address AND/OR cell phone number with patient  [x]  Verify name of each inhaler with patient (Symbicort, albuterol)  [x]  Remind patient to have inhalers on hand at time of appointment    Select the following:   [x] MyChart   [x]   Computer: Confirm Chrome is Therapist, art    []   Phone/tablet: confirm MyChart app is downloaded on mobile device   []  Link  []  Phone/tablet: Confirm Chrome is Therapist, art and patient is able to receive texts and has camera on phone  []  Computer: Confirm Chrome is Therapist, art and Ensure patient is able to access e-mail and has a Scientist, clinical (histocompatibility and immunogenetics) (MyChart access):  []  Provide patient with [facility] contact information for additional support- [phone]         Tenna Delaine, CPhT   Certified Pharmacy Technician   [place] [facility] ([facility])   P [phone], F [phone]

## 2021-06-28 ENCOUNTER — Telehealth: Admit: 2021-06-28 | Discharge: 2021-06-29 | Payer: MEDICARE

## 2021-06-28 NOTE — Unmapped (Signed)
[place] [facility] ([facility]) Clinic - COPD Best Practice Advisory (BPA) Alert Clinic    To patients reading this note: Please be advised that the primary purpose of this note is for [place] to keep track of your care and communicate with other members of your medical team. These suggested changes are intended to enhance the overall care you receive.  Approved changes will be communicated to you by your care team.     Karen Barber, a 56 y.o. female who presents for an initial real-time audio and video visit with the CAMP Clinic as part of the COPD BPA Clinic for inhaler teaching.   PCP: Noralyn Pick, FNP    ASSESSMENT & PLAN     COPD    Patient reported treatment: see table below in subjective & objective     Assessment / Plan  Goal of visit to reduce exacerbations and symptoms through inhaler education.  Educated patient on proper inhaler use technique and indication for HFA device (Symbicort, albuterol) by using pharmacist use of demo inhaler  and direct observation of patient use /demo. Educated patient to separate two puffs and use with two different breaths to optimize use.  Patient was able to verbalize understanding and all questions answered at this time. BPA alert addressed within chart   Reviewed importance medication adherence with patient   Determined if affordability of and access to medications is a concern for the patient.    Given change in providers, patient to request [facility] to send new Symbicort rx to pharmacy, which she reports she is comfortable doing herself.  Alternatively could advise [facility] of updated provider to request refills from.    Educated on prescription instructions for ipratropium of four times daily.  Patient reports having pulm appointment next week and will clarify if she needs to increased to scheduled dose of four times daily.  Assessed necessary information to complete MMRC questionnaire. BPA alert addressed _____________________________________________________    The patient reports they are currently: at home. I spent 20 minutes on the real-time audio and video with the patient on the date of service. I spent an additional 15 minutes on pre- and post-visit activities on the date of service.     The patient was physically located in [place] or a state in which I am permitted to provide care. The patient and/or parent/guardian understood that s/he may incur co-pays and cost sharing, and agreed to the telemedicine visit. The visit was reasonable and appropriate under the circumstances given the patient's presentation at the time.    The patient and/or parent/guardian has been advised of the potential risks and limitations of this mode of treatment (including, but not limited to, the absence of in-person examination) and has agreed to be treated using telemedicine. The patient's/patient's family's questions regarding telemedicine have been answered.     If the visit was completed in an ambulatory setting, the patient and/or parent/guardian has also been advised to contact their provider???s office for worsening conditions, and seek emergency medical treatment and/or call 911 if the patient deems either necessary.        Patient has completed the CAMP program. Questions/concerns were addressed to the patient's satisfaction. Patient provided the CAMP phone number for future needs. We have appreciated the opportunity to be part of this patient's care.     Future scheduled follow up visits include the following.   Future Appointments   Date Time Provider Department Center   06/28/2021  9:15 AM Vinnie Langton  Lyn Rio Kidane, CPP [facility]   06/29/2021 11:30 AM Melanee Left, MD [facility]   07/18/2021  9:20 AM Keri Rosita Fire, FNP [facility]   07/22/2021 10:20 AM [facility]       Shanon Brow, PharmD, CPP  Clinical Pharmacist, [place] [facility] ([facility])  [facility] Clinic: [phone] - Fax: [phone]        Subjective   SUBJECTIVE & OBJECTIVE:      COPD Medications:    Drug Class Medication   Rescue Inhaler Albuterol  inhaler and nebulizer   Muscarinic / anticholingeric Ipratropium  nebulizer    Maintenance Inhaler  (ICS Combo) Formoterol + budesonide (Symbicort)   inhaler   Also on treporstinil four times daily and sildenafil TID; uses home O2    Affordability:  Patient states inhalers listed above are affordable to them at the time of this visit.     Adherence:   Patient reports missed doses of Symbicort as out of supplies and refills. Given change in pulmonology practices, needs rx from new [facility] provider.  Patient reports ipratropium TID via neb.      MMRC  Most recent MMRC dyspnea scale: 2 Last MMRC date: 04/19/2021     HEALTH MAINTENANCE  Immunizations:  Immunization History   Administered Date(s) Administered    COVID-19 VACC,MRNA,(PFIZER)(PF) 06/11/2019, 07/02/2019, 11/21/2019    INFLUENZA INJ MDCK PF, QUAD,(FLUCELVAX)(90MO AND UP EGG FREE) 01/19/2021    INFLUENZA TIV (TRI) PF (IM) 01/02/2005, 12/14/2008    Influenza Vaccine Quad (IIV4 PF) 80mo+ injectable 11/28/2012, 12/19/2014, 12/16/2015, 01/03/2017, 12/06/2017, 01/08/2019, 12/15/2019    PNEUMOCOCCAL POLYSACCHARIDE 23 07/08/2007, 01/03/2017    PPD Test 01/19/2015    TdaP 07/08/2007       Smoking:  Social History     Tobacco Use   Smoking Status Former    Packs/day: 0.50    Years: 9.00    Pack years: 4.50    Types: Cigarettes    Quit date: 05/03/1987    Years since quitting: 34.1   Smokeless Tobacco Never        Current Outpatient Medications on File Prior to Visit   Medication Sig Dispense Refill    ACCU-CHEK FASTCLIX LANCET DRUM Misc 1 each by Other route Four (4) times a day (before meals and nightly). 400 each 1    ACCU-CHEK GUIDE GLUCOSE METER Misc 1 Device by Other route Four (4) times a day (before meals and nightly). AS DIRECTED 1 kit 0    albuterol 2.5 mg /3 mL (0.083 %) nebulizer solution Inhale 3 mL (2.5 mg total) by nebulization every six (6) hours as needed for wheezing or shortness of breath (airway clearance/cough). 360 mL 1    albuterol HFA 90 mcg/actuation inhaler Inhale 2 puffs every four (4) hours as needed for wheezing or shortness of breath. 54 g 1    amLODIPine (NORVASC) 5 MG tablet Take 1 tablet (5 mg total) by mouth daily. Added per patient 90 tablet 0    aspirin 325 MG tablet Take 325 mg by mouth daily.      blood sugar diagnostic Strp by Other route Three (3) times a day. ACCU-CHEK Guide meter 300 strip 1    budesonide-formoteroL (SYMBICORT) 160-4.5 mcg/actuation inhaler Inhale 2 puffs Two (2) times a day. 10.2 g 3    cyclobenzaprine (FLEXERIL) 10 MG tablet Take 1 tablet (10 mg total) by mouth Three (3) times a day as needed. 90 tablet 1  furosemide (LASIX) 20 MG tablet Take 1 tablet (20 mg total) by mouth daily as needed. 90 tablet 0    gabapentin (NEURONTIN) 300 MG capsule Take 1 capsule (300 mg total) by mouth Three (3) times a day. 270 capsule 1    insulin ASPART (NOVOLOG U-100 INSULIN ASPART) 100 unit/mL injection Take 1-20 units SQ QID AC and HS as directed 20 mL 1    insulin glargine (BASAGLAR, LANTUS) 100 unit/mL (3 mL) injection pen Inject 0.24 mL (24 Units total) under the skin nightly. 21 mL 1    ipratropium (ATROVENT) 0.02 % nebulizer solution Inhale 2.5 mL (500 mcg total) by nebulization four (4) times a day. 900 mL 1    MEDICAL SUPPLY ITEM Accucheck Glide Glocumeter for home glucose checks. Check blood glucose ACHS. 1 Package 0    mepolizumab (NUCALA) 100 mg/mL AtIn Inject the contents of 1 syringe (100 mg) under the skin every twenty-eight (28) days. 1 mL 12    miscellaneous medical supply Misc Nebulizer machine and tubing. Patients machine is 58 years old. 1 each 0    OXYGEN-AIR DELIVERY SYSTEMS MISC Dose: 2-3 LPM      posaconazole (NOXAFIL) 100 mg delayed released tablet Take 2 tablets (200 mg total) by mouth daily. 60 tablet 2    sildenafiL, pulm.hypertension, (REVATIO) 20 mg tablet Take 1 tablet (20 mg total) by mouth Three (3) times a day. 270 tablet 0    treprostiniL (TYVASO) 1.74 mg/2.9 mL (0.6 mg/mL) Nebu 3 puffs (18 mcg total) by via Home Nebulizer route four (4) times a day. 10.8 mL 1     No current facility-administered medications on file prior to visit.

## 2021-06-29 ENCOUNTER — Ambulatory Visit: Admit: 2021-06-29 | Discharge: 2021-06-30 | Payer: MEDICARE

## 2021-06-29 DIAGNOSIS — B441 Other pulmonary aspergillosis: Principal | ICD-10-CM

## 2021-06-29 DIAGNOSIS — Z9189 Other specified personal risk factors, not elsewhere classified: Principal | ICD-10-CM

## 2021-06-29 DIAGNOSIS — Z23 Encounter for immunization: Principal | ICD-10-CM

## 2021-06-29 LAB — CBC W/ AUTO DIFF
BASOPHILS ABSOLUTE COUNT: 0 10*9/L (ref 0.0–0.1)
BASOPHILS RELATIVE PERCENT: 0.7 %
EOSINOPHILS ABSOLUTE COUNT: 0.1 10*9/L (ref 0.0–0.5)
EOSINOPHILS RELATIVE PERCENT: 1.5 %
HEMATOCRIT: 36.2 % (ref 34.0–44.0)
HEMOGLOBIN: 11.6 g/dL (ref 11.3–14.9)
LYMPHOCYTES ABSOLUTE COUNT: 1 10*9/L — ABNORMAL LOW (ref 1.1–3.6)
LYMPHOCYTES RELATIVE PERCENT: 20.7 %
MEAN CORPUSCULAR HEMOGLOBIN CONC: 32.1 g/dL (ref 32.0–36.0)
MEAN CORPUSCULAR HEMOGLOBIN: 26 pg (ref 25.9–32.4)
MEAN CORPUSCULAR VOLUME: 81 fL (ref 77.6–95.7)
MEAN PLATELET VOLUME: 8.3 fL (ref 6.8–10.7)
MONOCYTES ABSOLUTE COUNT: 0.7 10*9/L (ref 0.3–0.8)
MONOCYTES RELATIVE PERCENT: 13.5 %
NEUTROPHILS ABSOLUTE COUNT: 3.2 10*9/L (ref 1.8–7.8)
NEUTROPHILS RELATIVE PERCENT: 63.6 %
NUCLEATED RED BLOOD CELLS: 0 /100{WBCs} (ref <=4)
PLATELET COUNT: 240 10*9/L (ref 150–450)
RED BLOOD CELL COUNT: 4.47 10*12/L (ref 3.95–5.13)
RED CELL DISTRIBUTION WIDTH: 13.8 % (ref 12.2–15.2)
WBC ADJUSTED: 5 10*9/L (ref 3.6–11.2)

## 2021-06-29 LAB — COMPREHENSIVE METABOLIC PANEL
ALBUMIN: 3.7 g/dL (ref 3.4–5.0)
ALKALINE PHOSPHATASE: 85 U/L (ref 46–116)
ALT (SGPT): 10 U/L (ref 10–49)
ANION GAP: 7 mmol/L (ref 5–14)
AST (SGOT): 15 U/L (ref <=34)
BILIRUBIN TOTAL: 0.7 mg/dL (ref 0.3–1.2)
BLOOD UREA NITROGEN: 22 mg/dL (ref 9–23)
BUN / CREAT RATIO: 20
CALCIUM: 9.8 mg/dL (ref 8.7–10.4)
CHLORIDE: 100 mmol/L (ref 98–107)
CO2: 30.8 mmol/L (ref 20.0–31.0)
CREATININE: 1.08 mg/dL — ABNORMAL HIGH
EGFR CKD-EPI (2021) FEMALE: 60 mL/min/{1.73_m2} (ref >=60)
GLUCOSE RANDOM: 176 mg/dL (ref 70–179)
POTASSIUM: 4.4 mmol/L (ref 3.4–4.8)
PROTEIN TOTAL: 8 g/dL (ref 5.7–8.2)
SODIUM: 138 mmol/L (ref 135–145)

## 2021-06-29 MED ORDER — POSACONAZOLE 100 MG TABLET,DELAYED RELEASE
ORAL_TABLET | Freq: Every day | ORAL | 3 refills | 30 days | Status: CP
Start: 2021-06-29 — End: ?

## 2021-06-29 NOTE — Unmapped (Signed)
ID Clinic Note - Follow-up Visit    ASSESSMENT AND PLAN:  56yoF w/ sarcoidosis and h/o chronic cavitary pulmonary aspergillosis in 2010 s/p LUL lobectomy w/ VATS on 12/03/14 who had frequent hospitalizations in 2018 for respiratory issues/infections, including in 09/2016 when she was found to have a L apical thick-walled cavitary lesion w/ adjacent lung parenchymal abnormalities and sputum culture positive for Aspergillus Luxembourg and Achromobacter spp. Initially treated with voriconazole for chronic pulmonary aspergillosis. This was transitioned to posaconazole to reduce potential toxicities after discussing risks and benefits of stopping therapy completely as it was unlikely to be curative and she is not a transplant candidate due to anatomical concerns. She has continued on this for several years now without adverse effect, so potential benefits of controlling infection have seemed to outweigh risks in shared decision-making discussions.    Primary issues over the last 2-3 years have been easy fatigability, chronic DOE, cough, and increased O2 requirement. She has established care with [facility], and they started her on inhaled treprostinil and amlodipine for pulmonary hypertension, which was noted to be far out of proportion with her lung disease per RHC (thought to have primary PAH as well). Greatest improvement in symptoms has come from learning how to use her inhalers correctly. O2 now down to 4L with that change. Has not noticed any side effects from the posaconazole. Of note, CT scan 05/2020 showed resolution of mycetoma in the apical cavity.     Again discussed risk/benefits of stopping and we elected to continue for now. She is getting repeat imaging with Pulm in May 2023. Will see how that looks and how her symptoms are doing in 3 months and consider trial off of posaconazole then.    Today:  - CONTINUE posaconazole 200mg  daily. Levels have been more than therapeutic, and she is not experiencing side effects. Discuss at each visit risks and benefits of trial off of this medication, elected to continue for now.   - LFTs done 03/2021 and wnl. Will repeat this and CBC w/ diff today. Repeat posaconazole trough today (has always been therapeutic).  - Continue to follow with [facility] re: sarcoid, pulm HTN, asthma.    ID Problem List:  Sarcoidosis 1995 (lip biopsy); PAH; Severe Eosinophilic Asthma  - No longer on prednisone  - On home O2, now at 4L at rest but up to as much as 8L with minimal activity.  - Worsening respiratory symptoms w/ mediastinal lymphadenopathy and fibrosis progressive on 01/2018 CT concerning for progression of sarcoidosis  - PET CT 08/2018 with metabolically active pulm fibrosis and hilar/mediastional LNs  - Transitioned care to [facility] from [facility] 10/2019  - Severe PAH on RHC done at [facility] (previously mild), out of proportion with lung disease  - Not a lung transplant candidate 2/2 anatomy after past surgery     Chronic cavitary pulmonary aspergillosis, first diagnosed 2010  - Previously treated with multiple courses of voriconazole  - 2016 cultures with Aspergillus fumigatus  - s/p LUL lobectomy/VATS 12/03/14 after episodes of hemoptysis followed by voriconazole 11/20/14-04/2015   - New L apical thick walled cavitary lesion and adjacent lung parenchymal abnormalities 09/08/16 in setting of 1 month of worsening pulmonary infectious symptoms, leukocytosis (on steroids). Sputum cultures: Aspergillus Luxembourg, OPF. Treated with vanc/cefepime/flagyl --> Augmentin x 2 weeks, voriconazole  - Re-presented 09/25/16 w/ increasing cough, sputum production, and fatigue  - CT 09/25/16 with new mycetoma in remaining left lung and worsening consolidation. LRCx Aspergillus Luxembourg  - Continued on  voriconazole since July 2018, troughs mostly therapeutic or just slightly subtherapeutic.  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  All documented time was specific to the E/M visit and does not include any procedures that may have been performed.     Diagnosis ICD-10-CM Associated Orders   1. Chronic pulmonary aspergillosis (CMS-HCC)  B44.1 CBC w/ Differential     Comprehensive Metabolic Panel     Posaconazole     posaconazole (NOXAFIL) 100 mg delayed released tablet     Posaconazole      2. At risk for adverse drug reaction  Z91.89 CBC w/ Differential     Comprehensive Metabolic Panel     Posaconazole     Posaconazole      3. Need for COVID-19 vaccine  Z23 COVID-19 VAC,BIVALENT(58YR UP)BOOST,PFIZER     COVID-19 VAC,BIVALENT(58YR UP)BOOST,PFIZER          Duanne Limerick, MD, MHS  Assistant Professor, [facility] Division of Infectious Diseases     [facility]   [address], 5th floor  [place], [place] [address]   Phone: [phone]   Fax: [phone]      _____________________________________________________________________        CC: F/u of mycetoma, chronic cavitary pulmonary aspergillosis     HPI:  51yoF w/ h/o sarcoidosis, severe eosinophilic asthma, bronchiectasis, chronic cavitary pulmonary aspergillosis s/p resection in 2016 currently on posaconazole for suppressive therapy, and positive M.fortuitum sputum cultures. Initially had response to Nucala, but only temporarily, so asthma not thought to be driving cause of her chronic symptoms. Based on CT and PET scan findings in 2020, active pulm sarcoid thought to be primary driver of symptoms. However, since then has established care with [facility] and had a repeat RHC late 2021 that showed severe PAH out of proportion with lung disease, so thought to have primary PAH as well.     Last visit with me was a telehealth visit on 04/2020.   - COVID-19 infection 03/24/21. Has had congestion since then.  - Following with PCP Etheleen Nicks  - Following with Pulm at [facility] - last seen 02/09/21. On Tyvaso and silenafil. Had lost 27lb at that visit.    Main issues today are related to familial stressors - taking care of her parents, brother who is spending their money irresponsibly on drugs for a girlfriend. Reports her breathing has been much better since she was taught  how to properly use inhalers - apparently she's been doing it incorrectly for years without knowing it. Down from 6L to 4L at rest with that change. No fevers chills. No side effects noted from posaconazole.      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 A.niger from sputum 08/2016-09/2016.    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    Sarcoidosis 1995    Type 2 diabetes mellitus with hyperglycemia (CMS-HCC) 07/27/2014    Visual impairment     glasses       Past Surgical History:   Procedure Laterality Date    LUNG LOBECTOMY      PR AMPUTATION TOE,MT-P JT Right 12/12/2019    Procedure: Right foot fifth toe amputation at the metatarsophalangeal joint level;  Surgeon: Britt Bottom, DPM;  Location: MAIN OR [facility];  Service: Vascular    PR AMPUTATION TOE,MT-P JT Right 06/16/2020    Procedure: Right foot amputation of 3rd toe at mpj level;  Surgeon: Britt Bottom, DPM;  Location: MAIN OR [facility];  Service: Vascular    PR RIGHT HEART CATH O2 SATURATION & CARDIAC OUTPUT N/A 04/29/2019    Procedure: Right Heart Catheterization;  Surgeon: Rosana Hoes, MD;  Location: [facility] CATH;  Service: Cardiology    PR THORACOSCOPY SURG TOT PULM DECORT Left 12/14/2014    Procedure: THORACOSCOPY SURG; W/TOT PULM DECORTIC/PNEUMOLYS;  Surgeon: Evert Kohl, MD;  Location: MAIN [facility];  Service: Cardiothoracic    PR THORACOSCOPY W/THERA WEDGE RESEXN INITIAL UNILAT Left 12/03/2014    Procedure: THORACOSCOPY, SURGICAL; WITH THERAPEUTIC WEDGE RESECTION (EG, MASS, NODULE) INITIAL UNILATERAL;  Surgeon: Evert Kohl, MD;  Location: MAIN OR ;  Service: Cardiothoracic       No Known Allergies    Current Outpatient Medications   Medication Sig Dispense Refill    ACCU-CHEK FASTCLIX LANCET DRUM Misc 1 each by Other route Four (4) times a day (before meals and nightly). 400 each 1    ACCU-CHEK GUIDE GLUCOSE METER Misc 1 Device by Other route Four (4) times a day (before meals and nightly). AS DIRECTED 1 kit 0    albuterol 2.5 mg /3 mL (0.083 %) nebulizer solution Inhale 3 mL (2.5 mg total) by nebulization every six (6) hours as needed for wheezing or shortness of breath (airway clearance/cough). 360 mL 1    albuterol HFA 90 mcg/actuation inhaler Inhale 2 puffs every four (4) hours as needed for wheezing or shortness of breath. 54 g 1    amLODIPine (NORVASC) 5 MG tablet Take 1 tablet (5 mg total) by mouth daily. Added per patient 90 tablet 0    aspirin 325 MG tablet Take 1 tablet (325 mg total) by mouth daily.      blood sugar diagnostic Strp  by Other route Three (3) times a day. ACCU-CHEK Guide meter 300 strip 1    cyclobenzaprine (FLEXERIL) 10 MG tablet Take 1 tablet (10 mg total) by mouth Three (3) times a day as needed. 90 tablet 1    furosemide (LASIX) 20 MG tablet Take 1 tablet (20 mg total) by mouth daily as needed. 90 tablet 0    gabapentin (NEURONTIN) 300 MG capsule Take 1 capsule (300 mg total) by mouth Three (3) times a day. 270 capsule 1    insulin ASPART (NOVOLOG U-100 INSULIN ASPART) 100 unit/mL injection Take 1-20 units SQ QID AC and HS as directed 20 mL 1    insulin glargine (BASAGLAR, LANTUS) 100 unit/mL (3 mL) injection pen Inject 0.24 mL (24 Units total) under the skin nightly. 21 mL 1    ipratropium (ATROVENT) 0.02 % nebulizer solution Inhale 2.5 mL (500 mcg total) by nebulization four (4) times a day. 900 mL 1    MEDICAL SUPPLY ITEM Accucheck Glide Glocumeter for home glucose checks. Check blood glucose ACHS. 1 Package 0    mepolizumab (NUCALA) 100 mg/mL AtIn Inject the contents of 1 syringe (100 mg) under the skin every twenty-eight (28) days. 1 mL 12    miscellaneous medical supply Misc Nebulizer machine and tubing. Patients machine is 58 years old. 1 each 0    OXYGEN-AIR DELIVERY SYSTEMS MISC Dose: 2-3 LPM      posaconazole (NOXAFIL) 100 mg delayed released tablet Take 2 tablets (200 mg total) by mouth daily. 60 tablet 2    sildenafiL, pulm.hypertension, (REVATIO) 20 mg tablet Take 1 tablet (20 mg total) by mouth Three (3) times a day. 270 tablet 0    treprostiniL (TYVASO) 1.74 mg/2.9 mL (0.6 mg/mL) Nebu 3 puffs (18 mcg total) by via Home Nebulizer route four (4) times a day. 10.8 mL 1    budesonide-formoteroL (SYMBICORT) 160-4.5 mcg/actuation inhaler Inhale 2 puffs Two (2) times a day. 10.2 g 3     No current facility-administered medications for this visit.       Social history:  Reviewed and unchanged except as noted in the HPI.    Family History   Problem Relation Age of Onset    Diabetes Father     Diabetes Brother     Diabetes Maternal Aunt     Sarcoidosis Maternal Aunt     Diabetes Maternal Uncle     Diabetes Paternal Aunt     Diabetes Paternal Uncle        ROS: 12 systems reviewed and negative except as per HPI.     PE:  BP 140/85 (BP Site: L Arm, BP Position: Sitting, BP Cuff Size: Medium)  - Pulse 83  - Temp 36.8 ??C (98.2 ??F) (Oral)  - Ht 180.3 cm (5' 11)  - Wt 94.8 kg (208 lb 15.9 oz)  - LMP  (LMP Unknown)  - BMI 29.15 kg/m??   GEN: alert, on O2, NAD  EYES: sclera anicteric, EOMI  CV: RRR, no LE edema  PULM: decreased over L base and apex, R clear without crackles or wheezes    Labs:    Lab Results   Component Value Date    WBC 5.0 06/29/2021    HGB 11.6 06/29/2021    HCT 36.2 06/29/2021    MCV 81.0 06/29/2021    RDW 13.8 06/29/2021    PLT 240 06/29/2021    NEUTROPCT 63.6 06/29/2021    LYMPHOPCT 20.7 06/29/2021    MONOPCT 13.5 06/29/2021  EOSPCT 1.5 06/29/2021    BASOPCT 0.7 06/29/2021     Lab Results   Component Value Date    ALT 10 06/29/2021    ALT 36 06/29/2014     Lab Results   Component Value Date    Alkaline Phosphatase 85 06/29/2021    Alkaline Phosphatase 194 (H) 06/29/2014           Drug monitoring:  Posa levels   - 05/22/18: 2,552 --> dose decreased to 200mg  daily  - 08/28/18: 1,636 --> continue 200mg  daily  - 05/30/19 (random): 1,874 --> no change  - 07/10/19 (trough): 2,058 ---> no change as she is tolerating well.    Lab Results   Component Value Date    ALKPHOS 73 04/19/2021    BILITOT 0.5 04/19/2021    BILIDIR <0.10 05/30/2019    PROT 7.3 04/19/2021    ALBUMIN 3.7 04/19/2021    ALT 12 04/19/2021    AST 16 04/19/2021       Microbiology:  2021:   12/12/19: R foot 5th toe bone - enterobacter cloacae, serratia marcesccens  08/22/19: Sputum  - AFB neg  - Fungal culture with multiple morphologies of mould  - LRCx not processed    2020:  04/25/18: sputum AFB cx +mycobacterium fortuitum    2019:  02/14/18: OPF, fungal neg, AFB NGTD; galactomannan <0.5, fungitell neg  9/19, 9/29: AFB cx +mycobacterium fortuitum  Mycobacterium fortuitum group       MIC SUSCEPTIBILITY RESULT     Amikacin 2  Susceptible     Cefoxitin 16  Susceptible     Ciprofloxacin 0.5  Susceptible     Clarithromycin >16  Resistant1     Doxycycline 4  Intermediate     Imipenem 8  Intermediate2     Linezolid 2  Susceptible     Minocycline 2  Intermediate     Moxifloxacin 0.25  Susceptible     Tigecycline 0.03  No Interpretation     Trimethoprim + Sulfamethoxazole 0.25  Susceptible      2018:  02/19/17: OPF, fungal neg, AFB neg  7/12 LRCx: Aspergillus Luxembourg, 3+ Achromobacter species, 3+ Enterococcus faecium  7/10 Sputum fungal culture: Aspergillus Luxembourg  7/9 LRCx: Aspergillus [place], OP flora, Achromobacter spp.    AFB smear x3 negative; cx NGTD      Imaging:   No recent imaging.

## 2021-06-29 NOTE — Unmapped (Signed)
It was good to see you today. WE will check labs and touch base again by video visit in 3 months. If you're still doing better and the CT scan looks OK, we'll talk about stopping the posaconazole then.

## 2021-07-01 ENCOUNTER — Ambulatory Visit
Admit: 2021-07-01 | Payer: MEDICARE | Attending: Rehabilitative and Restorative Service Providers" | Primary: Rehabilitative and Restorative Service Providers"

## 2021-07-01 LAB — POSACONAZOLE: POSACONAZOLE LEVEL: 2221 ng/mL

## 2021-07-05 NOTE — Unmapped (Signed)
[facility] Refill Coordination Note    Specialty Medication(s) to be Shipped:   CF/Pulmonary: -Nucala 100mg /ml Inj  Other medication(s) to be shipped: No additional medications requested for fill at this time     Karen Barber, DOB: 05/03/1965  Phone: [phone] (home)     All above HIPAA information was verified with patient.     Was a Nurse, learning disability used for this call? No    Completed refill call assessment today to schedule patient's medication shipment from the [facility] [phone]).  All relevant notes have been reviewed.     Specialty medication(s) and dose(s) confirmed: Regimen is correct and unchanged.   Changes to medications: Karen Barber reports no changes at this time.  Changes to insurance: No  New side effects reported not previously addressed with a pharmacist or physician: None reported  Questions for the pharmacist: No    Confirmed patient received a Conservation officer, historic buildings and a Surveyor, mining with first shipment. The patient will receive a drug information handout for each medication shipped and additional FDA Medication Guides as required.       DISEASE/MEDICATION-SPECIFIC INFORMATION        For CF patients: CF [facility] Active? No-not enrolled    SPECIALTY MEDICATION ADHERENCE     Medication Adherence    Patient reported X missed doses in the last month: 0  Specialty Medication: Nucala 100mg /ml Inj  Patient is on additional specialty medications: No  Patient is on more than two specialty medications: No  Informant: patient  Reliability of informant: reliable  Reasons for non-adherence: no problems identified  Confirmed plan for next specialty medication refill: delivery by pharmacy  Refills needed for supportive medications: not needed        Were doses missed due to medication being on hold? No    Nucala 100mg /ml Inj: 0 days of medicine on hand     REFERRAL TO PHARMACIST     Referral to the pharmacist: Not needed    [facility]     Shipping address confirmed in Epic.     Delivery Scheduled: Yes, Expected medication delivery date: 07/12/2021.     Medication will be delivered via Same Day Courier to the prescription address in Epic WAM.    Karen Barber   [facility] Specialty Technician

## 2021-07-07 NOTE — Unmapped (Unsigned)
Assessment and Plan:      Diagnosis ICD-10-CM Associated Orders   1. Type 2 diabetes mellitus with hyperglycemia, with long-term current use of insulin (CMS-HCC)  E11.65     Z79.4       2. Primary hypertension  I10       3. Screening for lipid disorders  Z13.220           I personally spent *** minutes face-to-face and non-face-to-face in the care of this patient, which includes all pre, intra, and post visit time on the date of service.    No follow-ups on file.    HPI:      Karen Barber  is here for No chief complaint on file.    Patient presents for a follow up visit. Her last visit with me was 04/19/21 for evaluation of o/o edema of her lower extremities. Follows closely with pulmonology and ID.    Diabetes: Patient presents for follow up of diabetes.  A1C goal is <8.  Diabetes has customarily not been at goal (complicated by: steroids, multiple chronic conditions).  Current symptoms include: {dm sx:14075}. Symptoms have {symptom progression:19445}. Patient denies {dm sx:19199}. Evaluation to date has included: hemoglobin A1C.  Home sugars: {dm home sugars:14018}. Current treatment: Continued metformin and insulin  which has been somewhat effective.  Doing regular exercise: {yes no:22180}.      Hypertension: Patient presents for follow-up of hypertension. Blood pressure goal < 140/90.  Hypertension has customarily been at goal.  Home blood pressure readings: {home bp readings:17448}. Salt intake and diet: salt not added to cooking and salt shaker not on table. Associated signs and symptoms: {symptoms hypertension:17452}. Patient denies: {symptoms hypertension:19611}. Medication compliance: taking as prescribed. She {is/is not:23060} doing regular exercise.     At last visit patient noted ongoing shoulder pain due to her dog pulling her on a walk. Recent imaging performed 04/19/21 - XR shoulder R   Mild acromioclavicular joint osteoarthritis.   No fracture. Normal joint alignment. Mild degenerative changes of the acromioclavicular joint. 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 Soft tissues are unremarkable.    Right lung apical bulla.         Patient has not required recent emergency room treatment for these symptoms, and has not required recent hospitalization.      Denies HA, fever, chest pain, shortness of breath, N/V/D, bowel or bladder issues, vision or hearing changes, or swelling.       ROS:      Comprehensive 10 point ROS negative unless otherwise stated in the HPI.      [facility] Components:     Medication adherence and barriers to the treatment plan have been addressed. Opportunities to optimize healthy behaviors have been discussed. Patient / caregiver voiced understanding.    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 A.niger from sputum 08/2016-09/2016.    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;  Service: Vascular    PR AMPUTATION TOE,MT-P JT Right 06/16/2020    Procedure: Right foot amputation of 3rd toe at mpj level;  Surgeon: Britt Bottom, DPM;  Location: MAIN OR [facility];  Service: Vascular    PR RIGHT HEART CATH O2 SATURATION & CARDIAC OUTPUT N/A 04/29/2019    Procedure: Right Heart Catheterization;  Surgeon: Rosana Hoes, MD;  Location: [facility] CATH;  Service: Cardiology    PR THORACOSCOPY SURG TOT PULM DECORT Left 12/14/2014    Procedure: THORACOSCOPY SURG; W/TOT PULM DECORTIC/PNEUMOLYS;  Surgeon: Evert Kohl, MD;  Location: MAIN OR [facility];  Service: Cardiothoracic    PR THORACOSCOPY W/THERA WEDGE RESEXN INITIAL UNILAT Left 12/03/2014    Procedure: THORACOSCOPY, SURGICAL; WITH THERAPEUTIC WEDGE RESECTION (EG, MASS, NODULE) INITIAL UNILATERAL;  Surgeon: Evert Kohl, MD;  Location: MAIN OR [facility];  Service: Cardiothoracic       Family History:     Family History   Problem Relation Age of Onset    Diabetes Father     Diabetes Brother     Diabetes Maternal Aunt     Sarcoidosis Maternal Aunt     Diabetes Maternal Uncle     Diabetes Paternal Aunt     Diabetes Paternal Uncle        Social History:     Social History     Tobacco Use    Smoking status: Former     Packs/day: 0.50     Years: 9.00     Pack years: 4.50     Types: Cigarettes     Quit date: 05/03/1987     Years since quitting: 34.2    Smokeless tobacco: Never   Vaping Use    Vaping Use: Never used   Substance Use Topics    Alcohol use: No     Alcohol/week: 0.0 standard drinks    Drug use: No       Allergies:     Patient has no known allergies.    Current Medications:     Current Outpatient Medications   Medication Sig Dispense Refill    ACCU-CHEK FASTCLIX LANCET DRUM Misc 1 each by Other route Four (4) times a day (before meals and nightly). 400 each 1    ACCU-CHEK GUIDE GLUCOSE METER Misc 1 Device by Other route Four (4) times a day (before meals and nightly). AS DIRECTED 1 kit 0    albuterol 2.5 mg /3 mL (0.083 %) nebulizer solution Inhale 3 mL (2.5 mg total) by nebulization every six (6) hours as needed for wheezing or shortness of breath (airway clearance/cough). 360 mL 1    albuterol HFA 90 mcg/actuation inhaler Inhale 2 puffs every four (4) hours as needed for wheezing or shortness of breath. 54 g 1    amLODIPine (NORVASC) 5 MG tablet Take 1 tablet (5 mg total) by  mouth daily. Added per patient 90 tablet 0    aspirin 325 MG tablet Take 1 tablet (325 mg total) by mouth daily.      blood sugar diagnostic Strp by Other route Three (3) times a day. ACCU-CHEK Guide meter 300 strip 1    budesonide-formoteroL (SYMBICORT) 160-4.5 mcg/actuation inhaler Inhale 2 puffs Two (2) times a day. 10.2 g 3    cyclobenzaprine (FLEXERIL) 10 MG tablet Take 1 tablet (10 mg total) by mouth Three (3) times a day as needed. 90 tablet 1    furosemide (LASIX) 20 MG tablet Take 1 tablet (20 mg total) by mouth daily as needed. 90 tablet 0    gabapentin (NEURONTIN) 300 MG capsule Take 1 capsule (300 mg total) by mouth Three (3) times a day. 270 capsule 1    insulin ASPART (NOVOLOG U-100 INSULIN ASPART) 100 unit/mL injection Take 1-20 units SQ QID AC and HS as directed 20 mL 1    insulin glargine (BASAGLAR, LANTUS) 100 unit/mL (3 mL) injection pen Inject 0.24 mL (24 Units total) under the skin nightly. 21 mL 1    ipratropium (ATROVENT) 0.02 % nebulizer solution Inhale 2.5 mL (500 mcg total) by nebulization four (4) times a day. 900 mL 1    MEDICAL SUPPLY ITEM Accucheck Glide Glocumeter for home glucose checks. Check blood glucose ACHS. 1 Package 0    mepolizumab (NUCALA) 100 mg/mL AtIn Inject the contents of 1 syringe (100 mg) under the skin every twenty-eight (28) days. 1 mL 12    miscellaneous medical supply Misc Nebulizer machine and tubing. Patients machine is 58 years old. 1 each 0    OXYGEN-AIR DELIVERY SYSTEMS MISC Dose: 2-3 LPM      posaconazole (NOXAFIL) 100 mg delayed released tablet Take 2 tablets (200 mg total) by mouth daily. 60 tablet 3    sildenafiL, pulm.hypertension, (REVATIO) 20 mg tablet Take 1 tablet (20 mg total) by mouth Three (3) times a day. 270 tablet 0 treprostiniL (TYVASO) 1.74 mg/2.9 mL (0.6 mg/mL) Nebu 3 puffs (18 mcg total) by via Home Nebulizer route four (4) times a day. 10.8 mL 1     No current facility-administered medications for this visit.       Health Maintenance:     Health Maintenance   Topic Date Due    HPV Cotest with Pap Smear (21-65)  Never done    Pap Smear with Cotest HPV (21-65)  02/27/2001    Zoster Vaccines (1 of 2) Never done    Colon Cancer Screening  07/19/2016    DTaP/Tdap/Td Vaccines (2 - Td or Tdap) 07/07/2017    Lipid Screening  11/05/2017    Pneumococcal Vaccine 0-64 (2 - PCV) 01/03/2018    Retinal Eye Exam  11/15/2019    Mammogram Start Age 110  02/29/2020    Foot Exam  01/18/2021    Urine Albumin/Creatinine Ratio  01/18/2021    Hemoglobin A1c  04/21/2021    COPD Spirometry  01/04/2022    Serum Creatinine Monitoring  06/30/2022    Potassium Monitoring  06/30/2022    Hepatitis C Screen  Completed    COVID-19 Vaccine  Completed    Influenza Vaccine  Completed       Immunizations:     Immunization History   Administered Date(s) Administered    COVID-19 VAC,BIVALENT(47YR UP)BOOST,PFIZER 06/29/2021    COVID-19 VACC,MRNA,(PFIZER)(PF) 06/11/2019, 07/02/2019, 11/21/2019    INFLUENZA INJ MDCK PF, QUAD,(FLUCELVAX)(28MO AND UP EGG FREE) 01/19/2021    INFLUENZA TIV (TRI) PF (  IM) 01/02/2005, 12/14/2008    Influenza Vaccine Quad (IIV4 PF) 66mo+ injectable 11/28/2012, 12/19/2014, 12/16/2015, 01/03/2017, 12/06/2017, 01/08/2019, 12/15/2019    PNEUMOCOCCAL POLYSACCHARIDE 23 07/08/2007, 01/03/2017    PPD Test 01/19/2015    TdaP 07/08/2007     I have reviewed and (if needed) updated the patient's problem list, medications, allergies, past medical and surgical history, social and family history.     Vital Signs:     Wt Readings from Last 3 Encounters:   06/29/21 94.8 kg (208 lb 15.9 oz)   04/19/21 94.8 kg (209 lb)   11/02/20 98 kg (216 lb)     Temp Readings from Last 3 Encounters:   06/29/21 36.8 ??C (98.2 ??F) (Oral)   04/19/21 37 ??C (98.6 ??F) (Oral) 11/02/20 37.1 ??C (98.8 ??F) (Oral)     BP Readings from Last 3 Encounters:   06/29/21 140/85   04/19/21 130/84   11/02/20 110/76     Pulse Readings from Last 3 Encounters:   06/29/21 83   04/19/21 70   11/02/20 87     Estimated body mass index is 29.15 kg/m?? as calculated from the following:    Height as of 06/29/21: 180.3 cm (5' 11).    Weight as of 06/29/21: 94.8 kg (208 lb 15.9 oz).  No height and weight on file for this encounter.      Objective:    ***  General: Alert and oriented x3. Chronically ill-appearing. No acute distress.   HEENT:  Normocephalic.  Atraumatic. Conjunctiva and sclera normal. Audible nasal congestion. OP MMM without lesions. Yellow pharyngeal exudate.   Neck:  Supple. No thyroid enlargement. No adenopathy.   Heart:  Regular rate and rhythm. Normal S1, S2. No murmurs, rubs or gallops.   Lungs:  No respiratory distress.  Lungs diminished to auscultation, with scattered rhonchi. No wheezes,or rales. Oxygen in use.  GI/GU:  Soft, +BS, nondistended, non-TTP. No palpable masses or organomegaly.   Extremities: Mild pedal edema bilateral. Peripheral pulses normal.   Skin:  Warm, dry. No rash or lesions present.   Neuro:  Non-focal. No obvious weakness.  Psych:  Affect normal, eye contact good, speech clear and coherent.         I attest that I, Vergia Alberts, personally documented this note while acting as scribe for Noralyn Pick, FNP.      Vergia Alberts, Scribe.  07/18/2021     The documentation recorded by the scribe accurately reflects the service I personally performed and the decisions made by me.    Noralyn Pick, FNP

## 2021-07-08 ENCOUNTER — Ambulatory Visit
Admit: 2021-07-08 | Discharge: 2021-07-09 | Payer: MEDICARE | Attending: Rehabilitative and Restorative Service Providers" | Primary: Rehabilitative and Restorative Service Providers"

## 2021-07-08 DIAGNOSIS — M7501 Adhesive capsulitis of right shoulder: Principal | ICD-10-CM

## 2021-07-08 NOTE — Unmapped (Signed)
Plan:  - Ice affected area for 15-20 minutes 2-3 times daily  - Heat affected area for 20-30 minutes 2-3 times daily  - Attend formal physical therapy beginning  as soon as possible.  - Perform Home Exercises as described below  - Refer to ultrasound provider for guided injection  - If you have any questions or concerns, contact our sports team coverage line at [phone]      Homemade Ice Pack  Supplies:  Letta Pate ziploc bag  - Rubbing Alcohol  - Water    Combine 1?? cups of water with a ?? cup of rubbing alcohol into ziploc bag. Seal and put in the freezer for several hours or overnight.    Rotator Cuff: Exercises  Your Care Instructions  Here are some examples of typical rehabilitation exercises for your condition. Start each exercise slowly. Ease off the exercise if you start to have pain.  Your doctor or physical therapist will tell you when you can start these exercises and which ones will work best for you.  How to do the exercises  Pendulum swing    Note: If you have pain in your back, do not do this exercise.  Hold on to a table or the back of a chair with your good arm. Then bend forward a little and let your sore arm hang straight down. This exercise does not use the arm muscles. Rather, use your legs and your hips to create movement that makes your arm swing freely.  Use the movement from your hips and legs to guide the slightly swinging arm back and forth like a pendulum (or elephant trunk). Then guide it in circles that start small (about the size of a dinner plate). Make the circles a bit larger each day, as your pain allows.  Do this exercise for 5 minutes, 5 to 7 times each day.  As you have less pain, try bending over a little farther to do this exercise. This will increase the amount of movement at your shoulder.    Shoulder flexion (lying down)    Note: To make a wand for this exercise, use a piece of PVC pipe or a broom handle with the broom removed. Make the wand about a foot wider than your shoulders.  Lie on your back, holding a wand with both hands. Your palms should face down as you hold the wand.  Keeping your elbows straight, slowly raise your arms over your head. Raise them until you feel a stretch in your shoulders, upper back, and chest.  Hold for 15 to 30 seconds.  Repeat 2 to 4 times.    Shoulder rotation (lying down)    Note: To make a wand for this exercise, use a piece of PVC pipe or a broom handle with the broom removed. Make the wand about a foot wider than your shoulders.  Lie on your back. Hold a wand with both hands with your elbows bent and palms up.  Keep your elbows close to your body, and move the wand across your body toward the sore arm.  Hold for 8 to 12 seconds.  Repeat 2 to 4 times.    Shoulder abduction (one-arm jumping jack motion) lying down        Note: To make a wand for this exercise, use a piece of PVC pipe or a broom handle with the broom removed. Make the wand about a foot wider than your shoulders.  Lie on your back. Hold a wand with  both hands with your elbows straight. Rest the palm of the affected arm against the end of the wand.  Keep your elbows straight and push the wand across your body toward the sore arm using only the unaffected arm.  Hold for 8 to 12 seconds.  Repeat 2 to 4 times.    Scapular exercise: Arm reach    Lie flat on your back. This exercise is a very slight motion that starts with your arms raised (elbows straight, arms straight).  From this position, reach higher toward the sky or ceiling. Keep your elbows straight. All motion should be from your shoulder blade only.  Relax your arms back to where you started.  Repeat 8 to 12 times.    Arm raise to the side with resistance band    Note: During this strengthening exercise, your arm should stay about 30 degrees to the front of your side.  Slowly raise your injured arm to the side, with your thumb facing up. Raise your arm 60 degrees at the most (shoulder level is 90 degrees).  Hold the position for 3 to 5 seconds. Then lower your arm back to your side. If you need to, bring your good arm across your body and place it under the elbow as you lower your injured arm. Use your good arm to keep your injured arm from dropping down too fast.  Repeat 8 to 12 times.  When you first start out, don't hold any extra weight in your hand. As you get stronger, you may use a 1-pound to 2-pound dumbbell or a small can of food.      Internal rotator strengthening exercise    Start by tying a piece of elastic exercise material to a doorknob. You can use surgical tubing or Thera-Band.  Stand or sit with your shoulder relaxed and your elbow bent 90 degrees. Your upper arm should rest comfortably against your side. Squeeze a rolled towel between your elbow and your body for comfort. This will help keep your arm at your side.  Hold one end of the elastic band in the hand of the painful arm.  Slowly rotate your forearm toward your body until it touches your belly. Slowly move it back to where you started.  Keep your elbow and upper arm firmly tucked against the towel roll or at your side.  Repeat 8 to 12 times.  External rotator strengthening exercise    Start by tying a piece of elastic exercise material to a doorknob. You can use surgical tubing or Thera-Band. (You may also hold one end of the band in each hand.)  Stand or sit with your shoulder relaxed and your elbow bent 90 degrees. Your upper arm should rest comfortably against your side. Squeeze a rolled towel between your elbow and your body for comfort. This will help keep your arm at your side.  Hold one end of the elastic band with the hand of the painful arm.  Start with your forearm across your belly. Slowly rotate the forearm out away from your body. Keep your elbow and upper arm tucked against the towel roll or the side of your body until you begin to feel tightness in your shoulder. Slowly move your arm back to where you started.  Repeat 8 to 12 times. Thank you for coming to [facility] and our clinic today!     We aim to provide you with the highest quality, individualized care.  If you have any unanswered questions  after the visit, please do not hesitate to reach out to [place] on MyChart or leave a message for the nurse.  ?  MyChart messages: These messages can be sent to your provider and will be checked by their clinical support staff.? The messages are checked throughout the day during normal business hours from 8:30 am-4:00 pm Monday-Friday, however responses may take up to 48 hours.? Please use this method of communication for non-urgent and non-emergent concerns, questions, refill requests or inquiries only.? ?Our team will help respond to all of your questions.? Please note that you may be asked to see a provider by either a telehealth or in person visit if it is deemed your questions are best handled in the clinic setting in person.??  ?  Please keep in mind, these messages are not real time communications, so be patient when waiting for a response.    If you do not have access to MyChart, do not know how to use MyChart or have an issue that may require more extensive discussion, please call the nurses' call line: [phone].? This line is checked throughout the day and will be responded to as time allows.? Please note that return calls could take up to 48 hours, depending on the nature of the need.?  ?  If you have an issue that requires emergent attention that cannot wait; either call the Orthopaedics resident on call at [phone], consider coming to our [facility] walk-in clinic, or go to the nearest Emergency Department.    If you need to schedule future appointments, please call [phone].     We look forward to seeing you again in the future and appreciate you choosing  for your care!    Thank you,                We provide innovative and comprehensive patient centered care that is supported by evidence-based research RESEARCH PARTICIPATION    Please check out our current research studies to see if you or someone you know may qualify at:    [email or web address]

## 2021-07-08 NOTE — Unmapped (Signed)
SPORTS MEDICINE NEW VISIT    ASSESSMENT AND PLAN       ICD-10-CM   1. Adhesive capsulitis of right shoulder  M75.01       PLAN  Requested Prescriptions      No prescriptions requested or ordered in this encounter     - I explained to the patient that there is concern for a possible rotator cuff tear, however, we will need to resolve the frozen shoulder before further work-up can be done to assess for a tear. She verbalized proper undrestanding.  - Home exercise program provided  - Referral to formal physical therapy/occupational therapy  - Patient will use OTC medication for swelling/pain control  - Ice affected area for 15-20 minutes every hour as needed  - Refer to non-op sports medicine for further evaluation under ultrasound with consideration of guided injection, if warranted    Return for Ultrasound Procedure/guided injection - right shoulder LVGH injection.    Procedure(s):  None    Test Results  Imaging  None performed today.    SUBJECTIVE     Chief Complaint:   Chief Complaint   Patient presents with    Right Shoulder - Pain     Pain from shoulder down to elbow - seems like the lateral aspect of arm       Hand Dominance: Right    History of Present Illness: 56 y.o. female who presents for Right shoulder pain. Inciting event: arm being pulled by on-leash dog, date: 1 year ago, patient reports continued right shoulder pain since having her arm forcefully jerked while holding her dog on-leash. Recent x-rays are negative for any bony abnormalities and she is referred for further evaluation and treatment. She continues to have pain that radiates down to the elbow and is worse at night. She is also complaining of loss of mobility in the shoulder.  Pain is located diffuse. Discomfort is described as aching, sharp/stabbing, and throbbing.   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 A.niger from sputum 08/2016-09/2016.    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 as calculated from the following:    Height as of 06/29/21: 180.3  cm (5' 11).    Weight as of 06/29/21: 94.8 kg (208 lb 15.9 oz).  Gen: Well-appearing female in no acute distress    MUSCULOSKELETAL       RIGHT shoulder LEFT shoulder   Inspection: No swelling, erythema, or deformity  Palpation: Tender: No tenderness to palpation  Range of motion:  90/100 degrees flexion, 70 degrees abduction, 10 degrees internal rotation, and 50 degrees external rotation   Special Tests:  4/5 Supraspinatus strength with pain Inspection: No swelling, erythema, or deformity  Palpation: Tender: No tenderness to palpation  Range of motion:  normal   Strength:  5/5  Special Tests: None performed. Patient is neurovascularly intact distally.       MEDICAL DECISION MAKING (level of service defined by 2/3 elements)     Number/Complexity of Problems Addressed 1 acute complicated injury (99204/99214)   Amount/Complexity of Data to be Reviewed/Analyzed Independent interpretation of a test performed by another physician/other qualified health care professional (99204/99214)   Risk of Complications/Morbidity/Mortality of Management Physical Therapy/Occupational Therapy (99203/99213)         ADMINISTRATIVE     I have personally reviewed and interpreted the images (as available).  I have personally reviewed prior records and incorporated relevant information above (as available).      DME     DME ORDER:  Dx:  ,

## 2021-07-12 MED FILL — NUCALA 100 MG/ML SUBCUTANEOUS AUTO-INJECTOR: SUBCUTANEOUS | 28 days supply | Qty: 1 | Fill #12

## 2021-07-16 MED ORDER — CYCLOBENZAPRINE 10 MG TABLET
ORAL_TABLET | Freq: Three times a day (TID) | ORAL | 1 refills | 30 days | PRN
Start: 2021-07-16 — End: 2022-07-17

## 2021-07-16 MED ORDER — TREPROSTINIL 1.74 MG/2.9 ML (0.6 MG/ML) SOLUTION FOR NEBULIZATION
Freq: Four times a day (QID) | RESPIRATORY_TRACT | 1 refills | 90 days
Start: 2021-07-16 — End: 2022-07-16

## 2021-07-18 ENCOUNTER — Ambulatory Visit: Admit: 2021-07-18 | Payer: MEDICARE | Attending: Family | Primary: Family

## 2021-07-18 MED ORDER — TREPROSTINIL 1.74 MG/2.9 ML (0.6 MG/ML) SOLUTION FOR NEBULIZATION
Freq: Four times a day (QID) | RESPIRATORY_TRACT | 1 refills | 90 days
Start: 2021-07-18 — End: 2022-07-18

## 2021-07-18 MED ORDER — CYCLOBENZAPRINE 10 MG TABLET
ORAL_TABLET | Freq: Three times a day (TID) | ORAL | 1 refills | 30 days | PRN
Start: 2021-07-18 — End: 2022-07-19

## 2021-07-18 NOTE — Unmapped (Signed)
Patient is requesting the following refill  Requested Prescriptions     Pending Prescriptions Disp Refills    cyclobenzaprine (FLEXERIL) 10 MG tablet 90 tablet 1     Sig: Take 1 tablet (10 mg total) by mouth Three (3) times a day as needed.     Refused Prescriptions Disp Refills    treprostiniL (TYVASO) 1.74 mg/2.9 mL (0.6 mg/mL) Nebu 10.8 mL 1     Sig: 3 puffs (18 mcg total) by via Home Nebulizer route four (4) times a day.     Refused By: Wyonia Hough     Reason for Refusal: Prescriber not at this practice       Recent Visits  Date Type Provider Dept   04/19/21 Office Visit Keri Rosita Fire, FNP [facility]   11/02/20 Office Visit Keri Rosita Fire, FNP [facility]   Showing recent visits within past 365 days with a meds authorizing provider and meeting all other requirements  Today's Visits  Date Type Provider Dept   07/18/21 Appointment Keri Rosita Fire, FNP [facility] [address] [facility]   Showing today's visits with a meds authorizing provider and meeting all other requirements  Future Appointments  No visits were found meeting these conditions.  Showing future appointments within next 365 days with a meds authorizing provider and meeting all other requirements

## 2021-07-20 MED ORDER — CYCLOBENZAPRINE 10 MG TABLET
ORAL_TABLET | Freq: Three times a day (TID) | ORAL | 1 refills | 30 days | Status: CP | PRN
Start: 2021-07-20 — End: 2022-07-21

## 2021-07-22 ENCOUNTER — Ambulatory Visit: Admit: 2021-07-22 | Payer: MEDICARE

## 2021-07-26 NOTE — Unmapped (Signed)
A1C Outreach Complete

## 2021-07-27 ENCOUNTER — Ambulatory Visit: Admit: 2021-07-27 | Discharge: 2021-07-28 | Payer: MEDICARE

## 2021-07-27 MED ADMIN — triamcinolone acetonide (KENALOG) injection 20 mg: 20 mg | INTRA_ARTICULAR | @ 15:00:00 | Stop: 2021-07-27

## 2021-07-28 NOTE — Unmapped (Signed)
SPORTS MEDICINE RETURN VISIT    ASSESSMENT AND PLAN      Diagnosis ICD-10-CM Associated Orders   1. 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  Overall, she has significant medical comorbidities and I think that an initial trial of nonsurgical management is in her best interest given her comorbidities.  I recommended we try a subacromial injection and start formal physical therapy.  PT referral was provided.  I would like to see her back in 6 weeks to reassess how she is doing.  If she is still very stiff, then I will perform a large-volume injection at that visit to help with what I suspect is an element of adhesive capsulitis.  She expressed understanding and agreement with the plan.  She prefers to avoid surgery if at all possible.    Return in about 6 weeks (around 09/07/2021).    Procedure(s):  Ultrasound-guided Subacromial Bursa hydrodissection Injection    Consent   After discussing the various treatment options for the condition, It was agreed that a corticosteroid injection would be the next step in treatment. The nature of and the indications for a corticosteroid and / or local anaesthetic injection were reviewed in detail with the patient today. The inherent risks of injection including infection, allergic reaction, increased pain, incomplete relief or temporary relief of symptoms, alterations of blood glucose levels requiring careful monitoring and treatment as indicated, tendon, ligament or articular cartilage rupture or degeneration, nerve injury, skin depigmentation, and/or fatty atrophy were discussed.   Procedure   After the risks and benefits of the procedure were explained,verbal consent was given, and a procedural time-out was performed. Anatomic landmarks identified using ultrasound the shoulder was evaluated the pathologic subacromial bursa identified The site was marked and prepped with Chlorhexidine solution.  The injection site was anesthetized with ethyl chloride.  The subacromial bursa was identified via lateral approach and then injected with 20 milligrams of Kenalog and 4 cc of 0.5% Ropivicaine using a sterile technique and a 22 gauge 1.5 inch needle. During injection, there was unrestricted flow and care was taken not to inject corticosteroid into the skin or subcutaneous tissues. There were no complications during the procedure.    Post procedure a sterile band-aide was applied. Post-injection instructions were given regarding post-procedure care, when to follow up in clinic and what to expect from the procedure. The patient tolerated the injection well and was discharged without complication.      NEED FOR SONOGRAPHIC GUIDANCE    Given the complexity of this problem, the anatomic location of this structure, sonographic guidance is recommended to prevent injury to neurovascular structures and confirm accuracy of injection. The accuracy of doing these injections blind is poor and the benefit to the patient by using ultrasound guidance is significant to avoid complications.     Reference:  American Medical Society for Sports Medicine (AMSSM) position statement: interventional musculoskeletal ultrasound in sports medicine.  Morey Hummingbird MM, Adams E, Berkoff D, Concoff AL, Jiles Crocker J Sports Med. 2014 Oct 20. pii: [id number]. doi: 10.1136/[id number]                SUBJECTIVE     Chief Complaint:   Chief Complaint  Patient presents with   ??? Right Shoulder - Pain     Pain onset a year ago, getting exponentially worse. Trouble with ROM and pain     History of Present Illness: 56 y.o. female with a complex past medical history of sarcoidosis on home O2 who presents for evaluation treatment of right shoulder pain.  She is accompanied by her husband today.  She states that the pain started a little over a year ago when she was jerked backwards by her pitbull resulting in her falling backwards.  The pain is located over the lateral shoulder and radiates down into the mid humerus.  She states that she has significant pain at rest and it increases with any type of motion.  For the last several months she has had to sleep on the couch due to pain at night.  She has now lost range of motion has difficulty raising her arm above her head and behind her back.  There is no numbness or tingling.  She states that her blood sugars are much better controlled now that she is off of prednisone.  She stopped prednisone independently due to side effects.  She states that in the morning her blood sugars usually around 120 and after eating in the afternoon it is 160-170.  She states that at its highest, it is 200.    Past Medical History:   Past Medical History:   Diagnosis Date   ??? 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 Breast injury     lung surg on left incision under breast sept 2016   ??? Caregiver burden     parents    ??? Hepatitis C antibody test positive 2016    repeatedly negative HCV RNA, indicating clearance of infection w/o treatment   ??? Hyperlipidemia    ??? Hypertension    ??? 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 On home O2 2008    per Dr Mal Amabile note from 02/21/2017   ??? Pulmonary aspergillosis (CMS-HCC) 2016    A.fumigatus in 2016, prior to LUL lobectomy. A.niger from sputum 08/2016-09/2016.   ??? 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 Sarcoidosis 1995   ??? Type 2 diabetes mellitus with hyperglycemia (CMS-HCC) 07/27/2014   ??? Visual impairment     glasses     OBJECTIVE     Physical Exam:  Vitals:   Wt Readings from Last 3 Encounters:   06/29/21 94.8 kg (208 lb 15.9 oz)   04/19/21 94.8 kg (209 lb)   11/02/20 98 kg (216 lb) Estimated body mass index is 29.15 kg/m?? as calculated from the following:    Height as of 06/29/21: 180.3 cm (5' 11).    Weight as of 06/29/21: 94.8 kg (208 lb 15.9 oz).  Gen: Well-appearing female in no acute distress  MSK: Shoulder right:              Muscle Atrophy: Absent   Swelling: Absent              Palpation: Tenderness: Subacromial space, biceps tendon.  None at the [facility] joint, posterior glenohumeral joint              Crepitus: None              Masses: No palpable masses    Active ROM FF AB ER at side IR at side  Right  150  90 55 Buttock   Left 165 120 65 Thoracic spin   Passive range of motion is slightly better but I did not force it due to significant pain     Pain with ROM of the shoulder: Positive               Muscle Strength or Pain with Strength Testing   Infraspinatus (ER at side) 5-/5   Supraspinatus (Jobe's) 4/5   Subscapularis (belly press) 5/5     Special Shoulder Tests:   Hawkins: Positive   Neer: Positive   O'Brien's Test: Positive   Speed: Positive   Lift Off Test: Negative   Cross Body Adduction: Negative, diffuse pain not AC joint  Drop arm: Negative  IS lag: Negative  Painful arc: Positive    Imaging/other tests:   X-ray right shoulder: 06/09/2021  My interpretation: Mild AC joint osteoarthritis.  Small spur on the inferior portion of the distal clavicle preserved glenohumeral joint.    Limited point-of-care ultrasound of the right shoulder: 07/27/2021  Biceps: Intact, no significant thickening.  Small tenosynovitis distally  Subscapularis: Intact, mild thickening, no obvious full-thickness tear.  Bursal thickening over the superficial portion of the subscapularis  Supraspinatus: This was a technically challenging tendon to visualize due to pain from positioning in the modified crass.  She appears to have a full-thickness partial width tear beginning at the anterior supraspinatus.  It measured approximately 1.15 cm anterior to posterior.  Infraspinatus: Intact, no high-grade partial-thickness or full-thickness tear.  Posterior glenohumeral joint: Small effusion    Orthopaedic PROMIS:  [facility] PROMIS 07/08/2021 07/27/2021   Upper Extremity Physical Function CAT Score 11 7   Pain Interference CAT Score 40 32           07/08/2021 07/27/2021   [facility] Promis   Upper Extremity Physical Function CAT Score 11 7   Pain Interference CAT Score 40 32     ADMINISTRATIVE     I have personally reviewed and interpreted the images (as available).  Point-of-care ultrasound imaging is on file and stored in a permanent location (if performed).  I have personally reviewed prior records and incorporated relevant information above (as available).    MEDICAL DECISION MAKING (level of service defined by 2/3 elements)     Number/Complexity of Problems Addressed 1 undiagnosed new problem with uncertain prognosis (99204/99214)   Amount/Complexity of Data to be Reviewed/Analyzed Independent interpretation of a test performed by another physician/other qualified health care professional (99204/99214)   Risk of Complications/Morbidity/Mortality of Management Decision for MINOR Surgery (Including Injection) WITH Risk Factors (99204/99214)     TIME     Total Time for E/M Services on the Date of Encounter Time-based coding not utilized for this encounter     CONSULTATION     Consultation services provided No     MODIFIER -25     Significant, Separately Billable Evaluation and Management YES - The patient's condition required a significant, separately identifiable, medical necessary evaluation and management service by the provider in addition to the procedure performed on the same date of service. The evaluation and management service was above and beyond the usual preoperative and postoperative care associated with the procedure.       PROCEDURES     Lg Joint Inj: R subacromial bursa on 07/27/2021 10:40 AM  Details: 22 G needle, ultrasound-guided lateral approach  Laterality: right  Location: shoulder  Medications: 20 mg triamcinolone acetonide 10 mg/mL    Medical Care  Team Attestation: All ProcDoc orders were read back and verbally confirmed with the procedure provider, including but not limited to patient name, medication name, dose, and route, before any actions were taken.         DME     DME ORDER:  Dx:  ,

## 2021-07-28 NOTE — Unmapped (Signed)
Thank you for coming to [facility] and our clinic today!     We aim to provide you with the highest quality, individualized care.  If you have any unanswered questions after the visit, please do not hesitate to reach out to us on MyChart or leave a message for the nurse.  ?  MyChart messages: These messages can be sent to your provider and will be checked by their clinical support staff.? The messages are checked throughout the day during normal business hours from 8:30 am-4:00 pm Monday-Friday, however responses may take up to 48 hours.? Please use this method of communication for non-urgent and non-emergent concerns, questions, refill requests or inquiries only.? ?Our team will help respond to all of your questions.? Please note that you may be asked to see a provider by either a telehealth or in person visit if it is deemed your questions are best handled in the clinic setting in person.??  ?  Please keep in mind, these messages are not real time communications, so be patient when waiting for a response.    If you do not have access to MyChart, do not know how to use MyChart or have an issue that may require more extensive discussion, please call the nurses' call line: [phone].? This line is checked throughout the day and will be responded to as time allows.? Please note that return calls could take up to 48 hours, depending on the nature of the need.?  ?  If you have an issue that requires emergent attention that cannot wait; either call the Orthopaedics resident on call at [phone], consider coming to our [facility] walk-in clinic, or go to the nearest Emergency Department.    If you need to schedule future appointments, please call [phone].     We look forward to seeing you again in the future and appreciate you choosing [facility] for your care!    Thank you,                We provide innovative and comprehensive patient centered care that is supported by evidence-based research RESEARCH PARTICIPATION    Please check out our current research studies to see if you or someone you know may qualify at:    [email or web address]

## 2021-08-04 MED ORDER — MEPOLIZUMAB 100 MG/ML SUBCUTANEOUS AUTO-INJECTOR
SUBCUTANEOUS | 12 refills | 28 days
Start: 2021-08-04 — End: ?

## 2021-08-13 DIAGNOSIS — B441 Other pulmonary aspergillosis: Principal | ICD-10-CM

## 2021-08-13 MED ORDER — POSACONAZOLE 100 MG TABLET,DELAYED RELEASE
ORAL_TABLET | Freq: Every day | ORAL | 3 refills | 30 days
Start: 2021-08-13 — End: ?

## 2021-08-13 MED ORDER — NUCALA 100 MG/ML SUBCUTANEOUS AUTO-INJECTOR
SUBCUTANEOUS | 12 refills | 28 days
Start: 2021-08-13 — End: ?

## 2021-08-16 MED ORDER — NUCALA 100 MG/ML SUBCUTANEOUS AUTO-INJECTOR
SUBCUTANEOUS | 12 refills | 28 days | Status: CN
Start: 2021-08-16 — End: ?

## 2021-08-16 MED ORDER — MEPOLIZUMAB 100 MG/ML SUBCUTANEOUS AUTO-INJECTOR
SUBCUTANEOUS | 12 refills | 28 days
Start: 2021-08-16 — End: ?

## 2021-08-16 NOTE — Unmapped (Signed)
[facility] Refill Coordination Note    Specialty Medication(s) to be Shipped:   CF/Pulmonary/Asthma: Nucala 100mg /ml Inj  Other medication(s) to be shipped: No additional medications requested for fill at this time     Karen Barber, DOB: 1965/11/04  Phone: [phone] (home)     All above HIPAA information was verified with patient.     Was a Nurse, learning disability used for this call? No    Completed refill call assessment today to schedule patient's medication shipment from the [facility] [phone]).  All relevant notes have been reviewed.     Specialty medication(s) and dose(s) confirmed: Regimen is correct and unchanged.   Changes to medications: Karen Barber reports no changes at this time.  Changes to insurance: No  New side effects reported not previously addressed with a pharmacist or physician: None reported  Questions for the pharmacist: No    Confirmed patient received a Conservation officer, historic buildings and a Surveyor, mining with first shipment. The patient will receive a drug information handout for each medication shipped and additional FDA Medication Guides as required.       DISEASE/MEDICATION-SPECIFIC INFORMATION        For CF patients: CF [facility] Active? No-not enrolled    SPECIALTY MEDICATION ADHERENCE     Medication Adherence    Patient reported X missed doses in the last month: 0  Specialty Medication: Nucala 100mg /ml Inj  Patient is on additional specialty medications: No  Patient is on more than two specialty medications: No  Informant: patient  Reliability of informant: reliable  Reasons for non-adherence: no problems identified        Were doses missed due to medication being on hold? No    Nucala 100mg /ml Inj: 0 days of medicine on hand (Next Injection: 08/14/21, missed it, but will take it as soon as they receive it)    REFERRAL TO PHARMACIST     Referral to the pharmacist: Not needed    [facility]     Shipping address confirmed in Epic.     Delivery Scheduled: Yes, Expected medication delivery date: 08/18/2021.     Medication will be delivered via Same Day Courier to the prescription address in Epic WAM.    Karen Barber   [facility] Specialty Technician

## 2021-08-17 MED ORDER — POSACONAZOLE 100 MG TABLET,DELAYED RELEASE
ORAL_TABLET | Freq: Every day | ORAL | 3 refills | 30 days | Status: CP
Start: 2021-08-17 — End: ?
  Filled 2021-08-22: qty 60, 30d supply, fill #0

## 2021-08-18 NOTE — Unmapped (Signed)
[facility]   Patient Onboarding/Medication Counseling    Karen Barber is seeing a new provider for management of asthma (Nucala). She agreed to reach out to clinic to request a new prescription is sent to our pharmacy since she is now past due for her Nucala dose (last dose 4/28). She agreed to take Nucala once she receives it.     Karen Barber is a 56 y.o. female with chronic cavitary pulmonary aspergillosis who I am counseling today on continuation of therapy.  I am speaking to the patient.    Was a Nurse, learning disability used for this call? No    Verified patient's date of birth / HIPAA.    Specialty medication(s) to be sent: CF/Pulmonary/Asthma: Nucala 100 mg/mL and Infectious Disease: posaconazole      Non-specialty medications/supplies to be sent: n/a      Medications not needed at this time: n/a       The patient declined counseling on medication administration, missed dose instructions, goals of therapy, side effects and monitoring parameters, warnings and precautions, drug/food interactions, and storage, handling precautions, and disposal because they have taken the medication previously. The information in the declined sections below are for informational purposes only and was not discussed with patient.       Noxafil (posaconazole)    Medication & Administration   Dosage:   Take 2 tablets (200mg ) by mouth daily    Administration:   Swallow tablets whole; do not divide, crush, dissolve, or chew.   Take with food.    Adherence/Missed dose instructions:  Take a missed dose as soon as you think about it.  If it is less than 12 hours until the next dose, skip the missed dose and go back to your normal time.  Do not take 2 doses at the same time or extra doses.    Goals of Therapy   To treat fungal infections  To prevent fungal infections  Side Effects & Monitoring Parameters   Common side effects  Headache.  Feeling tired or weak.  Nausea, vomiting, diarrhea, constipation, stomach pain  Cough   Pain or swelling or sores in the mouth  Not hungry  Muscle or bone pain    The following side effects should be reported to the provider:  Signs of an allergic reaction  Electrolyte issues (mood changes, confusion, muscle pain or weakness, a abnormal heartbeat, seizures, or very bad upset stomach or throwing up)  High or low blood pressure (very bad headache or dizziness, passing out, or change in eyesight)  Shortness of breath.  Swelling in the arms or legs.  Fever or chills.  Sore throat.  Nosebleed.  Any unexplained bruising or bleeding.  Vaginal bleeding that is not normal.  Feeling very tired or weak.  Fast or abnormal heartbeat, or if you pass out.  Liver problems (dark urine, feeling tired, not hungry, upset stomach or stomach pain, light-colored stools, throwing up, or yellow skin or eyes)  Mouth irritation or mouth sores.    Monitoring Parameters:  Measure liver function- prior to and during therapy  CBC  Serum electrolytes- prior to and during therapy    Contraindications, Warnings, & Precautions   Coadministration with sirolimus  Coadministration with ergot alkaloids  Coadministration with HMG-CoA reductase inhibitors (statins)  Coadministration with QT interval prolonging medications    Drug/Food Interactions   Medication list reviewed in Epic. The patient was instructed to inform the care team before taking any new medications or supplements.  Please see  below for drug interactions .   Drug interactions:   Sildenafil: posaconaozle can increase levels of sildenafil.  She has been taking together with no problems  Amlodipine: posaconazole can increase levels of amlodipine. Need to watch for dizziness from hypotension and edema from increased amlodipine.  She has been taking together with no problems  Symbicort: posaconazole can increase the budesonide levels.  She has been taking together with no problems.  Storage, Handling Precautions, & Disposal   Store room temperature  Keep away from children and pets      Current Medications (including OTC/herbals), Comorbidities and Allergies     Current Outpatient Medications   Medication Sig Dispense Refill    ACCU-CHEK FASTCLIX LANCET DRUM Misc 1 each by Other route Four (4) times a day (before meals and nightly). 400 each 1    ACCU-CHEK GUIDE GLUCOSE METER Misc 1 Device by Other route Four (4) times a day (before meals and nightly). AS DIRECTED 1 kit 0    albuterol 2.5 mg /3 mL (0.083 %) nebulizer solution Inhale 3 mL (2.5 mg total) by nebulization every six (6) hours as needed for wheezing or shortness of breath (airway clearance/cough). 360 mL 1    albuterol HFA 90 mcg/actuation inhaler Inhale 2 puffs every four (4) hours as needed for wheezing or shortness of breath. 54 g 1    amLODIPine (NORVASC) 5 MG tablet Take 1 tablet (5 mg total) by mouth daily. Added per patient 90 tablet 0    aspirin 325 MG tablet Take 1 tablet (325 mg total) by mouth daily.      blood sugar diagnostic Strp by Other route Three (3) times a day. ACCU-CHEK Guide meter 300 strip 1    budesonide-formoteroL (SYMBICORT) 160-4.5 mcg/actuation inhaler Inhale 2 puffs Two (2) times a day. 10.2 g 3    cyclobenzaprine (FLEXERIL) 10 MG tablet Take 1 tablet (10 mg total) by mouth Three (3) times a day as needed. 90 tablet 1    furosemide (LASIX) 20 MG tablet Take 1 tablet (20 mg total) by mouth daily as needed. 90 tablet 0    gabapentin (NEURONTIN) 300 MG capsule Take 1 capsule (300 mg total) by mouth Three (3) times a day. 270 capsule 1    insulin ASPART (NOVOLOG U-100 INSULIN ASPART) 100 unit/mL injection Take 1-20 units SQ QID AC and HS as directed 20 mL 1    insulin glargine (BASAGLAR, LANTUS) 100 unit/mL (3 mL) injection pen Inject 0.24 mL (24 Units total) under the skin nightly. 21 mL 1    ipratropium (ATROVENT) 0.02 % nebulizer solution Inhale 2.5 mL (500 mcg total) by nebulization four (4) times a day. 900 mL 1    MEDICAL SUPPLY ITEM Accucheck Glide Glocumeter for home glucose checks. Check blood glucose ACHS. 1 Package 0    mepolizumab (NUCALA) 100 mg/mL AtIn Inject the contents of 1 syringe (100 mg) under the skin every twenty-eight (28) days. 1 mL 12    miscellaneous medical supply Misc Nebulizer machine and tubing. Patients machine is 58 years old. 1 each 0    OXYGEN-AIR DELIVERY SYSTEMS MISC Dose: 2-3 LPM      posaconazole (NOXAFIL) 100 mg delayed released tablet Take 2 tablets (200 mg total) by mouth daily. 60 tablet 3    sildenafiL, pulm.hypertension, (REVATIO) 20 mg tablet Take 1 tablet (20 mg total) by mouth Three (3) times a day. 270 tablet 0    treprostiniL (TYVASO) 1.74 mg/2.9 mL (0.6 mg/mL) Nebu 3 puffs (18 mcg  total) by via Home Nebulizer route four (4) times a day. 10.8 mL 1     No current facility-administered medications for this visit.       No Known Allergies    Patient Active Problem List   Diagnosis    Pulmonary sarcoidosis (CMS-HCC)    HTN (hypertension)    Aspergillus fumigatus (CMS-HCC)    Type 2 diabetes mellitus with hyperglycemia (CMS-HCC)    Bronchiectasis type 1 (CMS-HCC)    Cavitary lesion of lung    Invasive pulmonary aspergillosis (CMS-HCC)    COPD (chronic obstructive pulmonary disease) (CMS-HCC)    Dyspnea on exertion    Chronic respiratory failure with hypoxia (CMS-HCC)    Chronic pulmonary aspergillosis (CMS-HCC)    Eosinophilic asthma    Severe persistent asthma dependent on systemic steroids    Eosinophilia    DOE (dyspnea on exertion)    Osteomyelitis of ankle and foot (CMS-HCC)    Pulmonary hypertension (CMS-HCC)    Screening for lipid disorders       Reviewed and up to date in Epic.    Appropriateness of Therapy     Acute infections noted within Epic:  No active infections  Patient reported infection: None    Is medication and dose appropriate based on diagnosis and infection status? Yes    Prescription has been clinically reviewed: Yes    Baseline Quality of Life Assessment      How many days over the past month did your chronic pulmonary aspergillosis  keep you from your normal activities? For example, brushing your teeth or getting up in the morning. Karen Barber denies any respiratory symptoms at this time       Financial Information     Medication Assistance provided: None Required    Anticipated copay of $0 / 30 days reviewed with patient. Verified delivery address.    Delivery Information     Scheduled delivery date: 08/22/21 (Posaconazole) and 08/24/21 Virginia Crews)    Expected start date: ASAP - Karen Barber took last dose of posaconazole yesterday and last dose of Nucala on 4/28.    Medication will be delivered via Same Day Courier to the prescription address in [facility]. This shipment will not require a signature.      Explained the services we provide at [facility] and that each month we would call to set up refills.  Stressed importance of returning phone calls so that we could ensure they receive their medications in time each month.  Informed patient that we should be setting up refills 7-10 days prior to when they will run out of medication.  A pharmacist will reach out to perform a clinical assessment periodically.  Informed patient that a welcome packet, containing information about our pharmacy and other support services, a Notice of Privacy Practices, and a drug information handout will be sent.      The patient or caregiver noted above participated in the development of this care plan and knows that they can request review of or adjustments to the care plan at any time.      Patient or caregiver verbalized understanding of the above information as well as how to contact the pharmacy at [phone] option [phone] with any questions/concerns.  The pharmacy is open Monday through Friday 8:30am-4:30pm.  A pharmacist is available 24/7 via pager to answer any clinical questions they may have.    Patient Specific Needs     Does the patient have any physical, cognitive, or cultural  barriers? No    Does the patient have adequate living arrangements? (i.e. the ability to store and take their medication appropriately) Yes    Did you identify any home environmental safety or security hazards? No    Patient prefers to have medications discussed with  Patient     Is the patient or caregiver able to read and understand education materials at a high school level or above? Yes    Patient's primary language is  English     Is the patient high risk? No    SOCIAL DETERMINANTS OF HEALTH     At the [facility], we have learned that life circumstances - like trouble affording food, housing, utilities, or transportation can affect the health of many of our patients.   That is why we wanted to ask: are you currently experiencing any life circumstances that are negatively impacting your health and/or quality of life? No    Social Determinants of Psychologist, prison and probation services Strain: Low Risk     Difficulty of Paying Living Expenses: Not hard at all   Internet Connectivity: Not on file   Food Insecurity: No Food Insecurity    Worried About Programme researcher, broadcasting/film/video in the Last Year: Never true    Barista in the Last Year: Never true   Tobacco Use: Medium Risk    Smoking Tobacco Use: Former    Smokeless Tobacco Use: Never    Passive Exposure: Not on file   Housing/Utilities: Low Risk     Within the past 12 months, have you ever stayed: outside, in a car, in a tent, in an overnight shelter, or temporarily in someone else's home (i.e. couch-surfing)?: No    Are you worried about losing your housing?: No    Within the past 12 months, have you been unable to get utilities (heat, electricity) when it was really needed?: No   Alcohol Use: Not At Risk    How often do you have a drink containing alcohol?: Never    How many drinks containing alcohol do you have on a typical day when you are drinking?: Not on file    How often do you have 5 or more drinks on one occasion?: Never   Transportation Needs: No Transportation Needs    Lack of Transportation (Medical): No    Lack of Transportation (Non-Medical): No   Substance Use: Low Risk     Taken prescription drugs for non-medical reasons: Never    Taken illegal drugs: Never    Patient indicated they have taken drugs in the past year for non-medical reasons: Yes, [positive answer(s)]: Not on file   Health Literacy: Not on file   Physical Activity: Not on file   Interpersonal Safety: Not on file   Stress: Not on file   Intimate Partner Violence: Not on file   Depression: Not at risk    PHQ-2 Score: 0   Social Connections: Not on file       Would you be willing to receive help with any of the needs that you have identified today? Not applicable       Roderic Palau  [facility] Specialty Pharmacist

## 2021-08-18 NOTE — Unmapped (Signed)
[facility] Specialty Medication Onboarding    Specialty Medication: Posaconazole  Prior Authorization: Not Required   Financial Assistance: No - copay  <$25  Final Copay/Day Supply: $0 / 30    Insurance Restrictions: None     Notes to Pharmacist:     The triage team has completed the benefits investigation and has determined that the patient is able to fill this medication at [facility]. Please contact the patient to complete the onboarding or follow up with the prescribing physician as needed.

## 2021-08-22 MED ORDER — MEPOLIZUMAB 100 MG/ML SUBCUTANEOUS AUTO-INJECTOR
SUBCUTANEOUS | 12 refills | 28 days
Start: 2021-08-22 — End: ?

## 2021-08-23 NOTE — Unmapped (Signed)
Karen Barber 's Nucala shipment will be canceled  as a result of no refills remain on the prescription.  Refill request denied.    I have reached out to the patient  at [phone] and communicated the delivery change. We will not reschedule the medication and have removed this/these medication(s) from the work request.  We have canceled this work request.

## 2021-09-05 DIAGNOSIS — D869 Sarcoidosis, unspecified: Principal | ICD-10-CM

## 2021-09-05 MED ORDER — IPRATROPIUM BROMIDE 0.02 % SOLUTION FOR INHALATION
Freq: Four times a day (QID) | RESPIRATORY_TRACT | 1 refills | 90 days
Start: 2021-09-05 — End: 2022-09-05

## 2021-09-05 MED ORDER — CYCLOBENZAPRINE 10 MG TABLET
ORAL_TABLET | Freq: Three times a day (TID) | ORAL | 1 refills | 30 days | PRN
Start: 2021-09-05 — End: 2022-09-06

## 2021-09-05 MED ORDER — INSULIN GLARGINE (U-100) 100 UNIT/ML (3 ML) SUBCUTANEOUS PEN
Freq: Every evening | SUBCUTANEOUS | 1 refills | 87 days
Start: 2021-09-05 — End: 2022-09-05

## 2021-09-05 MED ORDER — ALBUTEROL SULFATE HFA 90 MCG/ACTUATION AEROSOL INHALER
RESPIRATORY_TRACT | 1 refills | 0 days | PRN
Start: 2021-09-05 — End: 2022-09-05

## 2021-09-05 MED ORDER — GABAPENTIN 300 MG CAPSULE
ORAL_CAPSULE | Freq: Three times a day (TID) | ORAL | 1 refills | 90 days
Start: 2021-09-05 — End: 2022-09-06

## 2021-09-05 MED ORDER — TREPROSTINIL 1.74 MG/2.9 ML (0.6 MG/ML) SOLUTION FOR NEBULIZATION
Freq: Four times a day (QID) | RESPIRATORY_TRACT | 1 refills | 90 days
Start: 2021-09-05 — End: 2022-09-05

## 2021-09-06 MED ORDER — CYCLOBENZAPRINE 10 MG TABLET
ORAL_TABLET | Freq: Three times a day (TID) | ORAL | 1 refills | 30 days | PRN
Start: 2021-09-06 — End: 2022-09-07

## 2021-09-06 MED ORDER — NUCALA 100 MG/ML SUBCUTANEOUS AUTO-INJECTOR
SUBCUTANEOUS | 2 refills | 28 days
Start: 2021-09-06 — End: ?

## 2021-09-06 MED ORDER — TREPROSTINIL 1.74 MG/2.9 ML (0.6 MG/ML) SOLUTION FOR NEBULIZATION
Freq: Four times a day (QID) | RESPIRATORY_TRACT | 1 refills | 90 days | Status: CP
Start: 2021-09-06 — End: 2022-09-06

## 2021-09-06 MED ORDER — INSULIN GLARGINE (U-100) 100 UNIT/ML (3 ML) SUBCUTANEOUS PEN
Freq: Every evening | SUBCUTANEOUS | 1 refills | 87 days | Status: CP
Start: 2021-09-06 — End: 2022-09-06

## 2021-09-06 MED ORDER — GABAPENTIN 300 MG CAPSULE
ORAL_CAPSULE | Freq: Three times a day (TID) | ORAL | 1 refills | 90 days | Status: CP
Start: 2021-09-06 — End: 2022-09-07

## 2021-09-06 MED ORDER — IPRATROPIUM BROMIDE 0.02 % SOLUTION FOR INHALATION
Freq: Four times a day (QID) | RESPIRATORY_TRACT | 1 refills | 90 days | Status: CP
Start: 2021-09-06 — End: 2022-09-06

## 2021-09-06 MED ORDER — ALBUTEROL SULFATE HFA 90 MCG/ACTUATION AEROSOL INHALER
RESPIRATORY_TRACT | 1 refills | 0 days | Status: CP | PRN
Start: 2021-09-06 — End: 2022-09-06

## 2021-09-06 NOTE — Unmapped (Signed)
Patient is requesting the following refill  Requested Prescriptions     Pending Prescriptions Disp Refills    cyclobenzaprine (FLEXERIL) 10 MG tablet 90 tablet 1     Sig: Take 1 tablet (10 mg total) by mouth Three (3) times a day as needed.     Signed Prescriptions Disp Refills    ipratropium (ATROVENT) 0.02 % nebulizer solution 900 mL 1     Sig: Inhale 2.5 mL (500 mcg total) by nebulization four (4) times a day.     Authorizing Provider: Loran Senters     Ordering User: Karen Barber    gabapentin (NEURONTIN) 300 MG capsule 270 capsule 1     Sig: Take 1 capsule (300 mg total) by mouth Three (3) times a day.     Authorizing Provider: Loran Senters     Ordering User: Karen Barber    insulin glargine (BASAGLAR, LANTUS) 100 unit/mL (3 mL) injection pen 21 mL 1     Sig: Inject 0.24 mL (24 Units total) under the skin nightly.     Authorizing Provider: Loran Senters     Ordering User: Karen Barber    albuterol HFA 90 mcg/actuation inhaler 54 g 1     Sig: Inhale 2 puffs every four (4) hours as needed for wheezing or shortness of breath.     Authorizing Provider: Loran Senters     Ordering User: Karen Barber    treprostiniL (TYVASO) 1.74 mg/2.9 mL (0.6 mg/mL) Nebu 10.8 mL 1     Sig: 3 puffs (18 mcg total) by via Home Nebulizer route four (4) times a day.     Authorizing Provider: Etheleen Nicks [facility]     Ordering User: Karen Barber       Recent Visits  Date Type Provider Dept   04/19/21 Office Visit Keri Rosita Fire, FNP [place] [facility]   11/02/20 Office Visit Keri Rosita Fire, FNP [place] [facility]   Showing recent visits within past 365 days with a meds authorizing provider and meeting all other requirements  Future Appointments  No visits were found meeting these conditions.  Showing future appointments within next 365 days with a meds authorizing provider and meeting all other requirements       Labs: Not applicable this refill

## 2021-09-06 NOTE — Unmapped (Signed)
Deniece Ree 's Nucala shipment will be sent out  as a result of a new prescription for the medication has been received.      I have reached out to the patient  at [phone] and communicated the delivery change. We will reschedule the medication for the delivery date that the patient agreed upon.  We have confirmed the delivery date as 6/21, via same day courier.

## 2021-09-06 NOTE — Unmapped (Signed)
Patient is requesting the following refill  Requested Prescriptions     Pending Prescriptions Disp Refills    ipratropium (ATROVENT) 0.02 % nebulizer solution 900 mL 1     Sig: Inhale 2.5 mL (500 mcg total) by nebulization four (4) times a day.    gabapentin (NEURONTIN) 300 MG capsule 270 capsule 1     Sig: Take 1 capsule (300 mg total) by mouth Three (3) times a day.    insulin glargine (BASAGLAR, LANTUS) 100 unit/mL (3 mL) injection pen 21 mL 1     Sig: Inject 0.24 mL (24 Units total) under the skin nightly.    albuterol HFA 90 mcg/actuation inhaler 54 g 1     Sig: Inhale 2 puffs every four (4) hours as needed for wheezing or shortness of breath.    treprostiniL (TYVASO) 1.74 mg/2.9 mL (0.6 mg/mL) Nebu 10.8 mL 1     Sig: 3 puffs (18 mcg total) by via Home Nebulizer route four (4) times a day.    cyclobenzaprine (FLEXERIL) 10 MG tablet 90 tablet 1     Sig: Take 1 tablet (10 mg total) by mouth Three (3) times a day as needed.       Recent Visits  Date Type Provider Dept   04/19/21 Office Visit Keri Rosita Fire, FNP [place] [facility]   11/02/20 Office Visit Keri Rosita Fire, FNP [place] [facility]   Showing recent visits within past 365 days with a meds authorizing provider and meeting all other requirements  Future Appointments  No visits were found meeting these conditions.  Showing future appointments within next 365 days with a meds authorizing provider and meeting all other requirements

## 2021-09-07 MED FILL — NUCALA 100 MG/ML SUBCUTANEOUS AUTO-INJECTOR: SUBCUTANEOUS | 28 days supply | Qty: 1 | Fill #0

## 2021-09-08 MED ORDER — CYCLOBENZAPRINE 10 MG TABLET
ORAL_TABLET | Freq: Three times a day (TID) | ORAL | 0 refills | 30 days | Status: CP | PRN
Start: 2021-09-08 — End: 2022-09-09

## 2021-09-14 ENCOUNTER — Ambulatory Visit: Admit: 2021-09-14 | Payer: MEDICARE

## 2021-09-14 MED ORDER — AMLODIPINE 5 MG TABLET
ORAL_TABLET | Freq: Every day | ORAL | 0 refills | 90 days
Start: 2021-09-14 — End: 2022-09-14

## 2021-09-14 MED ORDER — CYCLOBENZAPRINE 10 MG TABLET
ORAL_TABLET | Freq: Three times a day (TID) | ORAL | 0 refills | 30 days | PRN
Start: 2021-09-14 — End: 2022-09-15

## 2021-09-14 NOTE — Unmapped (Signed)
[facility] Refill Coordination Note    Specialty Medication(s) to be Shipped:   CF/Pulmonary/Asthma: Nucala 100mg /ml  Posaconazole 100 mg tab     Other medication(s) to be shipped: No additional medications requested for fill at this time     Karen Barber, DOB: [birth year]  Phone: [phone] (home)       All above HIPAA information was verified with patient.     Was a Nurse, learning disability used for this call? No    Completed refill call assessment today to schedule patient's medication shipment from the [facility] [phone]).  All relevant notes have been reviewed.     Specialty medication(s) and dose(s) confirmed: Regimen is correct and unchanged.   Changes to medications: Karen Barber reports no changes at this time.  Changes to insurance: No  New side effects reported not previously addressed with a pharmacist or physician: None reported  Questions for the pharmacist: No    Confirmed patient received a Conservation officer, historic buildings and a Surveyor, mining with first shipment. The patient will receive a drug information handout for each medication shipped and additional FDA Medication Guides as required.       DISEASE/MEDICATION-SPECIFIC INFORMATION        For patients on injectable medications: Patient currently has 0 doses left.  Next injection is scheduled for 09/26/21.    SPECIALTY MEDICATION ADHERENCE     Medication Adherence    Patient reported X missed doses in the last month: 0  Specialty Medication: posaconazole 100 mg delayed released tablet  Patient is on additional specialty medications: Yes  Additional Specialty Medications: NUCALA 100 mg/mL   Patient Reported Additional Medication X Missed Doses in the Last Month: 0  Patient is on more than two specialty medications: No  Any gaps in refill history greater than 2 weeks in the last 3 months: no  Informant: patient  Reliability of informant: reliable  Patient is at risk for Non-Adherence: No  Reasons for non-adherence: no problems identified              Were doses missed due to medication being on hold? No    NUCALA 100 mg/mL Atin (mepolizumab) : 0 days of medicine on hand   posaconazole 100 mg delayed released tablet (NOXAFIL)  : 3 days of medicine on hand       REFERRAL TO PHARMACIST     Referral to the pharmacist: Not needed      [facility]     Shipping address confirmed in Epic.     Delivery Scheduled: Yes, Expected medication delivery date: 09/16/21.     Medication will be delivered via Same Day Courier to the prescription address in Epic [facility].    Karen Barber' [facility] Specialty Technician

## 2021-09-15 MED ORDER — AMLODIPINE 5 MG TABLET
ORAL_TABLET | Freq: Every day | ORAL | 1 refills | 90 days | Status: CP
Start: 2021-09-15 — End: 2022-09-15

## 2021-09-15 MED ORDER — CYCLOBENZAPRINE 10 MG TABLET
ORAL_TABLET | Freq: Three times a day (TID) | ORAL | 2 refills | 30 days | PRN
Start: 2021-09-15 — End: 2022-09-16

## 2021-09-15 NOTE — Unmapped (Signed)
Patient is requesting the following refill  Requested Prescriptions     Pending Prescriptions Disp Refills    cyclobenzaprine (FLEXERIL) 10 MG tablet 90 tablet 2     Sig: Take 1 tablet (10 mg total) by mouth Three (3) times a day as needed.     Signed Prescriptions Disp Refills    amLODIPine (NORVASC) 5 MG tablet 90 tablet 1     Sig: Take 1 tablet (5 mg total) by mouth daily. Added per patient     Authorizing Provider: Etheleen Nicks [facility]     Ordering User: Barbaraann Boys       Recent Visits  Date Type Provider Dept   04/19/21 Office Visit Keri Rosita Fire, FNP [place] [facility]   11/02/20 Office Visit Keri Rosita Fire, FNP [place] [facility]   Showing recent visits within past 365 days with a meds authorizing provider and meeting all other requirements  Future Appointments  No visits were found meeting these conditions.  Showing future appointments within next 365 days with a meds authorizing provider and meeting all other requirements       Labs: Not applicable this refill

## 2021-09-16 MED ORDER — CYCLOBENZAPRINE 10 MG TABLET
ORAL_TABLET | Freq: Three times a day (TID) | ORAL | 2 refills | 30 days | Status: CP | PRN
Start: 2021-09-16 — End: 2022-09-17

## 2021-09-16 MED FILL — POSACONAZOLE 100 MG TABLET,DELAYED RELEASE: ORAL | 30 days supply | Qty: 60 | Fill #1

## 2021-09-16 NOTE — Unmapped (Signed)
Karen Barber 's Nucala shipment will be canceled  as a result of the medication is too soon to refill until 7/13.     I have reached out to the patient  at [phone] and communicated the delay. We will not reschedule the medication and have removed this/these medication(s) from the work request.  We have canceled this work request.     Posoconazole still being shipped for 6/30 delivery.

## 2021-10-03 NOTE — Unmapped (Signed)
[facility] Pharm[facility] Refill Coordination Note    Karen Barber, DOB: 02-Apr-1965  Phone: [phone] (home)       All above HIPAA information was verified with patient.         10/03/2021     2:49 PM   Specialty Rx Medication Refill Questionnaire   Which Medications would you like refilled and shipped? Nucala, none on hand   Please list all current allergies: 0 allergies   Have you missed any doses in the last 30 days? No   Have you had any changes to your medication(s) since your last refill? No   How many days remaining of each medication do you have at home? 0   If receiving an injectable medication, next injection date is 10/14/2021   Have you experienced any side effects in the last 30 days? No   Please enter the full address (street address, city, state, zip code) where you would like your medication(s) to be delivered to. [address] [place] [address]   Please specify on which day you would like your medication(s) to arrive. Note: if you need your medication(s) within 3 days, please call the pharmacy to schedule your order at [phone]  10/10/2021   Has your insurance changed since your last refill? No   Would you like a pharmacist to call you to discuss your medication(s)? No   Do you require a signature for your package? (Note: if we are billing Medicare Part B or your order contains a controlled substance, we will require a signature) No   Additional Comments: No comments         Completed refill call assessment today to schedule patient's medication shipment from the [facility] [phone]).  All relevant notes have been reviewed.       Confirmed patient received a Conservation officer, historic buildings and a Surveyor, mining with first shipment. The patient will receive a drug information handout for each medication shipped and additional FDA Medication Guides as required.         REFERRAL TO PHARMACIST     Referral to the pharmacist: Not needed      [facility]     Shipping address confirmed in Epic.     Delivery Scheduled: Yes, Expected medication delivery date: 10/10/21.     Medication will be delivered via Same Day Courier to the prescription address in Epic WAM.    Karen Barber' W Karen Barber Shared [facility] Specialty Technician

## 2021-10-10 MED FILL — NUCALA 100 MG/ML SUBCUTANEOUS AUTO-INJECTOR: SUBCUTANEOUS | 28 days supply | Qty: 1 | Fill #1

## 2021-10-19 NOTE — Unmapped (Unsigned)
ID Clinic Note - Follow-up Visit    ASSESSMENT AND PLAN:  56yoF w/ sarcoidosis and h/o chronic cavitary pulmonary aspergillosis in 2010 s/p LUL lobectomy w/ VATS on 12/03/14 who had frequent hospitalizations in 2018 for respiratory issues/infections, including in 09/2016 when she was found to have a L apical thick-walled cavitary lesion w/ adjacent lung parenchymal abnormalities and sputum culture positive for Aspergillus Luxembourg and Achromobacter spp. Initially treated with voriconazole for chronic pulmonary aspergillosis. This was transitioned to posaconazole to reduce potential toxicities after discussing risks and benefits of stopping therapy completely as it was unlikely to be curative and she is not a transplant candidate due to anatomical concerns. She has continued on this for several years now without adverse effect, so potential benefits of controlling infection have seemed to outweigh risks in shared decision-making discussions.    Primary issues over the last 2-3 years have been easy fatigability, chronic DOE, cough, and increased O2 requirement. She has established care with [facility], and they started her on inhaled treprostinil and amlodipine for pulmonary hypertension, which was noted to be far out of proportion with her lung disease per RHC (thought to have primary PAH as well). Greatest improvement in symptoms has come from learning how to use her inhalers correctly. O2 now down to 4L with that change. Has not noticed any side effects from the posaconazole. Of note, CT scan 05/2020 showed resolution of mycetoma in the apical cavity.     Again discussed risk/benefits of stopping and we elected to continue for now. She is getting repeat imaging with Pulm in May 2023. Will see how that looks and how her symptoms are doing in 3 months and consider trial off of posaconazole then.    Today:  - CONTINUE posaconazole 200mg  daily. Levels have been more than therapeutic, and she is not experiencing side effects. Discuss at each visit risks and benefits of trial off of this medication, elected to continue for now.   - LFTs done 03/2021 and wnl. Will repeat this and CBC w/ diff today. Repeat posaconazole trough today (has always been therapeutic).  - Continue to follow with [facility] re: sarcoid, pulm HTN, asthma.    ID Problem List:  Sarcoidosis 1995 (lip biopsy); PAH; Severe Eosinophilic Asthma  - No longer on prednisone  - On home O2, now at 4L at rest but up to as much as 8L with minimal activity.  - Worsening respiratory symptoms w/ mediastinal lymphadenopathy and fibrosis progressive on 01/2018 CT concerning for progression of sarcoidosis  - PET CT 08/2018 with metabolically active pulm fibrosis and hilar/mediastional LNs  - Transitioned care to [facility] from [facility] 10/2019  - Severe PAH on RHC done at [facility] (previously mild), out of proportion with lung disease  - Not a lung transplant candidate 2/2 anatomy after past surgery     Chronic cavitary pulmonary aspergillosis, first diagnosed 2010  - Previously treated with multiple courses of voriconazole  - 2016 cultures with Aspergillus fumigatus  - s/p LUL lobectomy/VATS 12/03/14 after episodes of hemoptysis followed by voriconazole 11/20/14-04/2015   - New L apical thick walled cavitary lesion and adjacent lung parenchymal abnormalities 09/08/16 in setting of 1 month of worsening pulmonary infectious symptoms, leukocytosis (on steroids). Sputum cultures: Aspergillus Luxembourg, OPF. Treated with vanc/cefepime/flagyl --> Augmentin x 2 weeks, voriconazole  - Re-presented 09/25/16 w/ increasing cough, sputum production, and fatigue  - CT 09/25/16 with new mycetoma in remaining left lung and worsening consolidation. LRCx Aspergillus Luxembourg  - Continued on  voriconazole since July 2018, troughs mostly therapeutic or just slightly subtherapeutic.  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  All documented time was specific to the E/M visit and does not include any procedures that may have been performed.    {No diagnosis found. (Refresh or delete this SmartLink)}      Duanne Limerick, MD, MHS  Assistant Professor, [facility] Division of Infectious Diseases     [facility]   [address], 5th floor  [place], [place] [address]   Phone: [phone]   Fax: [phone]      _____________________________________________________________________        CC: F/u of mycetoma, chronic cavitary pulmonary aspergillosis     HPI:  56yoF w/ h/o sarcoidosis, severe eosinophilic asthma, bronchiectasis, chronic cavitary pulmonary aspergillosis s/p resection in 2016 currently on posaconazole for suppressive therapy, and positive M.fortuitum sputum cultures. Initially had response to Nucala, but only temporarily, so asthma not thought to be driving cause of her chronic symptoms. Based on CT and PET scan findings in 2020, active pulm sarcoid thought to be primary driver of symptoms. However, since then has established care with [facility] and had a repeat RHC late 2021 that showed severe PAH out of proportion with lung disease, so thought to have primary PAH as well.     Last visit with me was in person in April 2023. Since then:  - Established with [facility] for shoulder pain and underwent steroid injection 07/08/21. Followed up wiht them 07/27/21 for subacromial injection and referred to PT.   - Seen by Pulm Vascular Disease clinic at [facility] 5/24. 6 min walk test - needed 6L w/ exertion. TTE with LVH and enlarged RV  with decreased systolic function. At that visit had run out of Tyvaso and supplemental O2, but no disease progression noted. Restarted on these treatments and referred to [facility] for asthma management  - 08/12/21: prescribed amox/clav by Dr. Berline Chough x 7 days  - 09/27/21: again prescribed 7 day course of amox/clav by Dr. 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 A.niger from sputum 08/2016-09/2016.    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    Sarcoidosis 1995    Type 2 diabetes mellitus with hyperglycemia (CMS-HCC) 07/27/2014    Visual impairment     glasses       Past Surgical History:   Procedure Laterality Date    LUNG LOBECTOMY      PR AMPUTATION TOE,MT-P JT Right 12/12/2019    Procedure: Right foot fifth toe amputation at the metatarsophalangeal joint level;  Surgeon: Britt Bottom, DPM;  Location: MAIN OR [facility];  Service: Vascular    PR AMPUTATION TOE,MT-P JT Right 06/16/2020    Procedure: Right foot amputation of 3rd toe at mpj level;  Surgeon: Britt Bottom, DPM;  Location: MAIN OR [facility];  Service: Vascular    PR RIGHT HEART CATH O2 SATURATION & CARDIAC OUTPUT N/A 04/29/2019    Procedure: Right Heart Catheterization;  Surgeon: Rosana Hoes, MD;  Location: [facility] CATH;  Service: Cardiology    PR THORACOSCOPY SURG TOT PULM DECORT Left 12/14/2014    Procedure: THORACOSCOPY SURG; W/TOT PULM DECORTIC/PNEUMOLYS;  Surgeon: Evert Kohl, MD;  Location: MAIN OR [facility];  Service: Cardiothoracic    PR THORACOSCOPY W/THERA WEDGE RESEXN INITIAL UNILAT Left 12/03/2014    Procedure: THORACOSCOPY, SURGICAL; WITH THERAPEUTIC WEDGE RESECTION (EG, MASS, NODULE) INITIAL UNILATERAL;  Surgeon: Evert Kohl, MD;  Location: MAIN OR [place];  Service: Cardiothoracic       No Known Allergies    Current Outpatient Medications   Medication Sig Dispense Refill    ACCU-CHEK FASTCLIX LANCET DRUM Misc 1 each by Other route Four (4) times a day (before meals and nightly). 400 each 1    ACCU-CHEK GUIDE GLUCOSE METER Misc 1 Device by Other route Four (4) times a day (before meals and nightly). AS DIRECTED 1 kit 0    albuterol 2.5 mg /3 mL (0.083 %) nebulizer solution Inhale 3 mL (2.5 mg total) by nebulization every six (6) hours as needed for wheezing or shortness of breath (airway clearance/cough). 360 mL 1    albuterol HFA 90 mcg/actuation inhaler Inhale 2 puffs every four (4) hours as needed for wheezing or shortness of breath. 54 g 1    amLODIPine (NORVASC) 5 MG tablet Take 1 tablet (5 mg total) by mouth daily. Added per patient 90 tablet 1    aspirin 325 MG tablet Take 1 tablet (325 mg total) by mouth daily.      blood sugar diagnostic Strp by Other route Three (3) times a day. ACCU-CHEK Guide meter 300 strip 1    budesonide-formoteroL (SYMBICORT) 160-4.5 mcg/actuation inhaler Inhale 2 puffs Two (2) times a day. 10.2 g 3    cyclobenzaprine (FLEXERIL) 10 MG tablet Take 1 tablet (10 mg total) by mouth Three (3) times a day as needed. 90 tablet 2    furosemide (LASIX) 20 MG tablet Take 1 tablet (20 mg total) by mouth daily as needed. 90 tablet 0    gabapentin (NEURONTIN) 300 MG capsule Take 1 capsule (300 mg total) by mouth  Three (3) times a day. 270 capsule 1    insulin ASPART (NOVOLOG U-100 INSULIN ASPART) 100 unit/mL injection Take 1-20 units SQ QID AC and HS as directed 20 mL 1    insulin glargine (BASAGLAR, LANTUS) 100 unit/mL (3 mL) injection pen Inject 0.24 mL (24 Units total) under the skin nightly. 21 mL 1    ipratropium (ATROVENT) 0.02 % nebulizer solution Inhale 2.5 mL (500 mcg total) by nebulization four (4) times a day. 900 mL 1    MEDICAL SUPPLY ITEM Accucheck Glide Glocumeter for home glucose checks. Check blood glucose ACHS. 1 Package 0    mepolizumab (NUCALA) 100 mg/mL AtIn Inject the contents of 1 pen (100 mg total) under the skin every 28 (twenty-eight) days Allow to sit at room temperature for 30 minutes prior to administration. 1 mL 2    miscellaneous medical supply Misc Nebulizer machine and tubing. Patients machine is 58 years old. 1 each 0    OXYGEN-AIR DELIVERY SYSTEMS MISC Dose: 2-3 LPM      posaconazole (NOXAFIL) 100 mg delayed released tablet Take 2 tablets (200 mg total) by mouth daily. 60 tablet 3    sildenafiL, pulm.hypertension, (REVATIO) 20 mg tablet Take 1 tablet (20 mg total) by mouth Three (3) times a day. 270 tablet 0    treprostiniL (TYVASO) 1.74 mg/2.9 mL (0.6 mg/mL) Nebu 3 puffs (18 mcg total) by via Home Nebulizer route four (4) times a day. 10.8 mL 1     No current facility-administered medications for this visit.       Social history:  Reviewed and unchanged except as noted in the HPI.    Family History   Problem Relation Age of Onset    Diabetes Father     Diabetes Brother     Diabetes Maternal Aunt     Sarcoidosis Maternal Aunt     Diabetes Maternal Uncle Diabetes Paternal Aunt     Diabetes Paternal Uncle        ROS: 12 systems reviewed and negative except as per HPI.     PE:  LMP  (LMP Unknown)   GEN: alert, on O2, NAD  EYES: sclera anicteric, EOMI  CV: RRR, no LE edema  PULM: decreased over L base and apex, R clear without crackles or wheezes    Labs:    Lab Results   Component Value Date    WBC 5.0 06/29/2021    HGB 11.6 06/29/2021    HCT 36.2 06/29/2021    MCV 81.0 06/29/2021    RDW 13.8 06/29/2021    PLT 240 06/29/2021    NEUTROPCT 63.6 06/29/2021    LYMPHOPCT 20.7 06/29/2021    MONOPCT 13.5 06/29/2021    EOSPCT 1.5 06/29/2021    BASOPCT 0.7 06/29/2021     Lab Results   Component Value Date    ALT 10 06/29/2021    ALT 36 06/29/2014     Lab Results   Component Value Date    Alkaline Phosphatase 85 06/29/2021    Alkaline Phosphatase 194 (H) 06/29/2014           Drug monitoring:  Posa levels   - 05/22/18: 2,552 --> dose decreased to 200mg  daily  - 08/28/18: 1,636 --> continue 200mg  daily  - 05/30/19 (random): 1,874 --> no change  - 07/10/19 (trough): 2,058 ---> no change as she is tolerating well.    Lab Results   Component Value Date    ALKPHOS 85 06/29/2021    BILITOT 0.7 06/29/2021  BILIDIR <0.10 05/30/2019    PROT 8.0 06/29/2021    ALBUMIN 3.7 06/29/2021    ALT 10 06/29/2021    AST 15 06/29/2021       Microbiology:  2021:   12/12/19: R foot 5th toe bone - enterobacter cloacae, serratia marcesccens  08/22/19: Sputum  - AFB neg  - Fungal culture with multiple morphologies of mould  - LRCx not processed    2020:  04/25/18: sputum AFB cx +mycobacterium fortuitum    2019:  02/14/18: OPF, fungal neg, AFB NGTD; galactomannan <0.5, fungitell neg  9/19, 9/29: AFB cx +mycobacterium fortuitum  Mycobacterium fortuitum group       MIC SUSCEPTIBILITY RESULT     Amikacin 2  Susceptible     Cefoxitin 16  Susceptible     Ciprofloxacin 0.5  Susceptible     Clarithromycin >16  Resistant1     Doxycycline 4  Intermediate     Imipenem 8  Intermediate2     Linezolid 2  Susceptible Minocycline 2  Intermediate     Moxifloxacin 0.25  Susceptible     Tigecycline 0.03  No Interpretation     Trimethoprim + Sulfamethoxazole 0.25  Susceptible      2018:  02/19/17: OPF, fungal neg, AFB neg  7/12 LRCx: Aspergillus Luxembourg, 3+ Achromobacter species, 3+ Enterococcus faecium  7/10 Sputum fungal culture: Aspergillus Luxembourg  7/9 LRCx: Aspergillus Luxem[place], OP flora, Achromobacter spp.    AFB smear x3 negative; cx NGTD      Imaging:   No recent imaging.

## 2021-10-19 NOTE — Unmapped (Signed)
Called  patient x2 concerning video appointment with provider scheduled for today. Phone call went unanswered straight to voice mail. Left discreet message. Provider made aware.

## 2021-11-07 MED FILL — POSACONAZOLE 100 MG TABLET,DELAYED RELEASE: ORAL | 30 days supply | Qty: 60 | Fill #2

## 2021-11-07 NOTE — Unmapped (Signed)
[facility] Refill Coordination Note    Specialty Medication(s) to be Shipped:   CF/Pulmonary/Asthma: Nucala 100mg     Other medication(s) to be shipped:  posaconazole 100mg      Karen Barber, DOB: [birth year]  Phone: [phone] (home)       All above HIPAA information was verified with patient.     Was a Nurse, learning disability used for this call? No    Completed refill call assessment today to schedule patient's medication shipment from the [facility] [phone]).  All relevant notes have been reviewed.     Specialty medication(s) and dose(s) confirmed: Patient reports changes to the regimen as follows: Nucala, pulmonary physician stopped Nucala August 18,2023 to see how her breathing would react to not using it.    Changes to medications: Karen Barber reports stopping the following medications: Nucala, possibility of restarting.  Changes to insurance: No  New side effects reported not previously addressed with a pharmacist or physician: None reported  Questions for the pharmacist: No    Confirmed patient received a Conservation officer, historic buildings and a Surveyor, mining with first shipment. The patient will receive a drug information handout for each medication shipped and additional FDA Medication Guides as required.       DISEASE/MEDICATION-SPECIFIC INFORMATION        For CF patients: CF [employer] Active? No-not enrolled    SPECIALTY MEDICATION ADHERENCE     Medication Adherence    Patient reported X missed doses in the last month: 0  Specialty Medication: NUCALA 100 mg/mL Atin (mepolizumab)  Patient is on additional specialty medications: No  Patient is on more than two specialty medications: No  Informant: patient  Reliability of informant: reliable  Provider-estimated medication adherence level: good  Reasons for non-adherence: no problems identified                                Were doses missed due to medication being on hold? No    NUCALA 100 mg/mL Atin (mepolizumab)  : 0 days of medicine on hand        REFERRAL TO PHARMACIST     Referral to the pharmacist: Not needed      [facility]     Shipping address confirmed in Epic.     Delivery Scheduled: Patient declined refill at this time due to pulmonary doctor stopped Nucala for the time being.     Medication will be delivered via UPS to the prescription address in Epic WAM.    Karen Barber' W Karen Barber Shared [facility] Specialty Technician

## 2021-11-16 ENCOUNTER — Telehealth: Admit: 2021-11-16 | Discharge: 2021-11-17 | Payer: MEDICARE

## 2021-11-16 DIAGNOSIS — Z9189 Other specified personal risk factors, not elsewhere classified: Principal | ICD-10-CM

## 2021-11-16 DIAGNOSIS — J984 Other disorders of lung: Principal | ICD-10-CM

## 2021-11-16 DIAGNOSIS — B441 Other pulmonary aspergillosis: Principal | ICD-10-CM

## 2021-11-16 DIAGNOSIS — R0609 Other forms of dyspnea: Principal | ICD-10-CM

## 2021-11-16 NOTE — Unmapped (Signed)
Karen Barber Infectious Disease at [facility] Checklist     Type of visit:  video    Are you located in [place]? yes    Reason for visit: follow up    Questions / Concerns that need to be addressed: none    General Consent to Treat (GCT) for epic video visits only: Verbal consent    HCDM reviewed and updated in Epic:    We are working to make sure all of our patients??? wishes are updated in Epic and part of that is documenting a Environmental health practitioner for each patient  A Health Care Decision Maker is someone you choose who can make health care decisions for you if you are not able - who would you most want to do this for you????  declined    HCDM (patient stated preference): Critz,Barbara And Sharma Covert - Mother - [phone]    COVID-19 Vaccine Summary  Which COVID-19 Vaccine was administered  Pfizer  Type:  Dates Given:  06/29/2021                   If no: Are you interested in scheduling? Declines vaccine

## 2021-11-16 NOTE — Unmapped (Addendum)
Please call [phone] to schedule a CT scan of your chest at your earliest convenience. Please get a posaconazole level drawn the next time you get labs with your primary care provider.

## 2021-11-16 NOTE — Unmapped (Signed)
ID Clinic Note - Follow-up Visit    ASSESSMENT AND PLAN:  56yoF w/ sarcoidosis and h/o chronic cavitary pulmonary aspergillosis in 2010 s/p LUL lobectomy w/ VATS on 12/03/14 who had frequent hospitalizations in 2018 for respiratory issues/infections, including in 09/2016 when she was found to have a L apical thick-walled cavitary lesion w/ adjacent lung parenchymal abnormalities and sputum culture positive for Aspergillus Luxembourg and Achromobacter spp. Initially treated with voriconazole for chronic pulmonary aspergillosis. This was transitioned to posaconazole to reduce potential toxicities after discussing risks and benefits of stopping therapy completely as it was unlikely to be curative and she is not a transplant candidate due to anatomical concerns. She has continued on this for several years now without adverse effect, so potential benefits of controlling infection have seemed to outweigh risks in shared decision-making discussions.    Primary issues over the last 2-3 years have been easy fatigability, chronic DOE, cough, and increased O2 requirement. She has established care with [facility], and they started her on inhaled treprostinil and amlodipine for pulmonary hypertension, which was noted to be far out of proportion with her lung disease per RHC (thought to have primary PAH as well). Greatest improvement in symptoms has come from learning how to use her inhalers correctly. Has not noticed any side effects from the posaconazole. Of note, CT scan 05/2020 showed resolution of mycetoma in the apical cavity. Current O2 needs are 4L at rest, up to 6L w/ activity.    Last CT was 05/2020 so will order today to assess burden of fungal disease. Continue posaconazole pending those results, but consider stopping if no mycetoma or other findings c/w aspergillosus. Again discussed risk/benefits of stopping.    Today:  - CONTINUE posaconazole 200mg  daily. Levels have been more than therapeutic, and she is not experiencing side effects. Discuss at each visit risks and benefits of trial off of this medication, elected to continue for now while awaiting imaging  - LFTs done 10/19/2021 and wnl. Repeating posaconazole trough twice yearly - due Oct 2023 (she will have this drawn at her PCP - ordered)  - CT chest w/o contrast ordered  - Continue to follow with [facility] re: sarcoid, pulm HTN.    ID Problem List:  Sarcoidosis 1995 (lip biopsy); PAH; Severe Eosinophilic Asthma  - On home O2, now at 4L at rest but up to as much as 8L with minimal activity.  - PET CT 08/2018 with metabolically active pulm fibrosis and hilar/mediastional LNs  - Transitioned care to [facility] from [facility] 10/2019  - Severe PAH on RHC done at [facility] (previously mild), out of proportion with lung disease  - Not a lung transplant candidate 2/2 anatomy after past surgery  - Starting Tyvaso, on sildenafil     Chronic cavitary pulmonary aspergillosis, first diagnosed 2010  - Previously treated with multiple courses of voriconazole  - 2016 cultures with Aspergillus fumigatus  - s/p LUL lobectomy/VATS 12/03/14 after episodes of hemoptysis followed by voriconazole 11/20/14-04/2015   - New L apical thick walled cavitary lesion and adjacent lung parenchymal abnormalities 09/08/16 in setting of 1 month of worsening pulmonary infectious symptoms, leukocytosis (on steroids). Sputum cultures: Aspergillus Luxem[place], OPF. Treated with vanc/cefepime/flagyl --> Augmentin x 2 weeks, voriconazole  - Re-presented 09/25/16 w/ increasing cough, sputum production, and fatigue  - CT 09/25/16 with new mycetoma in remaining left lung and worsening consolidation. LRCx Aspergillus Luxembourg  - Continued on voriconazole since July 2018, troughs mostly therapeutic or just slightly subtherapeutic.  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 Chronic pulmonary aspergillosis (CMS-HCC)  B44.1 CT chest without contrast     Posaconazole      2. DOE (dyspnea on exertion)  R06.09 CT chest without contrast     Posaconazole      3. Cavitary lesion of lung  J98.4 CT chest without contrast     Posaconazole      4. At risk for adverse drug reaction  Z91.89 Posaconazole            Duanne Limerick, MD, MHS  Assistant Professor, [facility] Division of Infectious Diseases     [facility]   [address], 5th floor  [place], [place] [address]   Phone: [phone]   Fax: [phone]      _____________________________________________________________________        56: F/u of mycetoma, chronic cavitary pulmonary aspergillosis     HPI:  56yoF w/ h/o sarcoidosis, severe eosinophilic asthma, bronchiectasis, chronic cavitary pulmonary aspergillosis s/p resection in 2016 currently on posaconazole for suppressive therapy, and positive M.fortuitum sputum cultures. Initially had response to Nucala, but only temporarily, so asthma not thought to be driving cause of her chronic symptoms. Based on CT and PET scan findings in 2020, active pulm sarcoid thought to be primary driver of symptoms. However, since then has established care with [facility] and had a repeat RHC late 2021 that showed severe PAH out of proportion with lung disease, so thought to have primary PAH as well.     Last visit with me was in person in April 2023. Since then:  - Established with [facility] for shoulder pain and underwent steroid injection 07/08/21. Followed up wiht them 07/27/21 for subacromial injection and referred to PT.   - Seen by Pulm Vascular Disease clinic at [facility] 5/24. 6 min walk test - needed 6L w/ exertion. TTE with LVH and enlarged RV  with decreased systolic function. At that visit had run out of Tyvaso and supplemental O2, but no disease progression noted. Restarted on these treatments and referred to [facility] for asthma management  - 08/12/21: prescribed amox/clav by Dr. Berline Chough x 7 days  - 09/27/21: again prescribed 7 day course of amox/clav by Dr. Berline Chough  - 10/19/21: visit with Dr. Berline Chough. Still having issues getting Tyvaso - clinic working on it. Still on sildenafil  - 11/04/21: seen in [facility] by Dr. Mariam Dollar. Nucala stopped as no known asthma - DOE thought to be related to pulm HTN + sarcoid. Plan for PFTs, considering an anti-fibrotic medicine.  Sarcoid on imaging appears stable.     Today, reports she started Tyvaso last week. Initially had headache, diarrhea, body aches, chest pain, cough. Still has headache right afte rthe injection and cough, but other symptoms have subsided. Associated with poor appetite - has lost 7lb in the last week. Nurse is coming out for 2nd week visit. Continuing for now to see if she develops benefit in terms of DOE that make side effects worth it.    Otherwise, denies fevers, chills, night sweats. No issues with posaconazole that she has noticed.      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 A.niger from sputum 08/2016-09/2016.    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    Sarcoidosis 1995    Type 2 diabetes mellitus with hyperglycemia (CMS-HCC) 07/27/2014    Visual impairment     glasses       Past Surgical History:   Procedure Laterality Date    LUNG LOBECTOMY      PR AMPUTATION TOE,MT-P JT Right 12/12/2019    Procedure: Right foot fifth toe amputation at the metatarsophalangeal joint level;  Surgeon: Britt Bottom, DPM;  Location: MAIN OR [facility];  Service: Vascular    PR AMPUTATION TOE,MT-P JT Right 06/16/2020    Procedure: Right foot amputation of 3rd toe at mpj level;  Surgeon: Britt Bottom, DPM;  Location: MAIN OR [facility];  Service: Vascular    PR RIGHT HEART CATH O2 SATURATION & CARDIAC OUTPUT N/A 04/29/2019    Procedure: Right Heart Catheterization;  Surgeon: Rosana Hoes, MD;  Location: [facility] CATH;  Service: Cardiology    PR THORACOSCOPY SURG TOT PULM DECORT Left 12/14/2014    Procedure: THORACOSCOPY SURG; W/TOT PULM DECORTIC/PNEUMOLYS;  Surgeon: Evert Kohl, MD;  Location: MAIN OR [facility];  Service: Cardiothoracic    PR THORACOSCOPY W/THERA WEDGE RESEXN INITIAL UNILAT Left 12/03/2014    Procedure: THORACOSCOPY, SURGICAL; WITH THERAPEUTIC WEDGE RESECTION (EG, MASS, NODULE) INITIAL UNILATERAL;  Surgeon: Evert Kohl, MD;  Location: MAIN OR [place];  Service: Cardiothoracic       No Known Allergies    Current Outpatient Medications   Medication Sig Dispense Refill    ACCU-CHEK FASTCLIX LANCET DRUM Misc 1 each by Other route Four (4) times a day (before meals and nightly). 400 each 1 ACCU-CHEK GUIDE GLUCOSE METER Misc 1 Device by Other route Four (4) times a day (before meals and nightly). AS DIRECTED 1 kit 0    albuterol 2.5 mg /3 mL (0.083 %) nebulizer solution Inhale 3 mL (2.5 mg total) by nebulization every six (6) hours as needed for wheezing or shortness of breath (airway clearance/cough). 360 mL 1    albuterol HFA 90 mcg/actuation inhaler Inhale 2 puffs every four (4) hours as needed for wheezing  or shortness of breath. 54 g 1    amLODIPine (NORVASC) 5 MG tablet Take 1 tablet (5 mg total) by mouth daily. Added per patient 90 tablet 1    aspirin 325 MG tablet Take 1 tablet (325 mg total) by mouth daily.      cyclobenzaprine (FLEXERIL) 10 MG tablet Take 1 tablet (10 mg total) by mouth Three (3) times a day as needed. 90 tablet 2    furosemide (LASIX) 20 MG tablet Take 1 tablet (20 mg total) by mouth daily as needed. 90 tablet 0    gabapentin (NEURONTIN) 300 MG capsule Take 1 capsule (300 mg total) by mouth Three (3) times a day. 270 capsule 1    insulin glargine (BASAGLAR, LANTUS) 100 unit/mL (3 mL) injection pen Inject 0.24 mL (24 Units total) under the skin nightly. 21 mL 1    ipratropium (ATROVENT) 0.02 % nebulizer solution Inhale 2.5 mL (500 mcg total) by nebulization four (4) times a day. 900 mL 1    MEDICAL SUPPLY ITEM Accucheck Glide Glocumeter for home glucose checks. Check blood glucose ACHS. 1 Package 0    mepolizumab (NUCALA) 100 mg/mL AtIn Inject the contents of 1 pen (100 mg total) under the skin every 28 (twenty-eight) days Allow to sit at room temperature for 30 minutes prior to administration. 1 mL 2    miscellaneous medical supply Misc Nebulizer machine and tubing. Patients machine is 58 years old. 1 each 0    OXYGEN-AIR DELIVERY SYSTEMS MISC Dose: 2-3 LPM      posaconazole (NOXAFIL) 100 mg delayed released tablet Take 2 tablets (200 mg total) by mouth daily. 60 tablet 3    sildenafiL, pulm.hypertension, (REVATIO) 20 mg tablet Take 1 tablet (20 mg total) by mouth Three (3) times a day. 270 tablet 0    treprostiniL (TYVASO) 1.74 mg/2.9 mL (0.6 mg/mL) Nebu 3 puffs (18 mcg total) by via Home Nebulizer route four (4) times a day. 10.8 mL 1    blood sugar diagnostic Strp by Other route Three (3) times a day. ACCU-CHEK Guide meter 300 strip 1    budesonide-formoteroL (SYMBICORT) 160-4.5 mcg/actuation inhaler Inhale 2 puffs Two (2) times a day. 10.2 g 3    insulin ASPART (NOVOLOG U-100 INSULIN ASPART) 100 unit/mL injection Take 1-20 units SQ QID AC and HS as directed 20 mL 1     No current facility-administered medications for this visit.       Social history:  Reviewed and unchanged except as noted in the HPI.    Family History   Problem Relation Age of Onset    Diabetes Father     Diabetes Brother     Diabetes Maternal Aunt     Sarcoidosis Maternal Aunt     Diabetes Maternal Uncle     Diabetes Paternal Aunt     Diabetes Paternal Uncle        ROS: 12 systems reviewed and negative except as per HPI.     PE:  No vitals - video visit only  GEN: alert, on O2, NAD  EYES: sclera anicteric, EOMI  PULM: normal WOB on O2.     Labs:    Lab Results   Component Value Date    WBC 5.0 06/29/2021    HGB 11.6 06/29/2021    HCT 36.2 06/29/2021    MCV 81.0 06/29/2021    RDW 13.8 06/29/2021    PLT 240 06/29/2021    NEUTROPCT 63.6 06/29/2021    LYMPHOPCT 20.7 06/29/2021  MONOPCT 13.5 06/29/2021    EOSPCT 1.5 06/29/2021    BASOPCT 0.7 06/29/2021     Lab Results   Component Value Date    ALT 10 06/29/2021    ALT 36 06/29/2014     Lab Results   Component Value Date    Alkaline Phosphatase 85 06/29/2021    Alkaline Phosphatase 194 (H) 06/29/2014           Drug monitoring:  Posa levels   - 05/22/18: 2,552 --> dose decreased to 200mg  daily  - 08/28/18: 1,636 --> continue 200mg  daily  - 05/30/19 (random): 1,874 --> no change  - 07/10/19 (trough): 2,058 ---> no change as she is tolerating well.  - 05/27/20: 758  - 06/29/21: 2,221    Lab Results   Component Value Date    ALKPHOS 85 06/29/2021    BILITOT 0.7 06/29/2021 BILIDIR <0.10 05/30/2019    PROT 8.0 06/29/2021    ALBUMIN 3.7 06/29/2021    ALT 10 06/29/2021    AST 15 06/29/2021       Microbiology:  2021:   12/12/19: R foot 5th toe bone - enterobacter cloacae, serratia marcesccens  08/22/19: Sputum  - AFB neg  - Fungal culture with multiple morphologies of mould  - LRCx not processed    2020:  04/25/18: sputum AFB cx +mycobacterium fortuitum    2019:  02/14/18: OPF, fungal neg, AFB NGTD; galactomannan <0.5, fungitell neg  9/19, 9/29: AFB cx +mycobacterium fortuitum  Mycobacterium fortuitum group       MIC SUSCEPTIBILITY RESULT     Amikacin 2  Susceptible     Cefoxitin 16  Susceptible     Ciprofloxacin 0.5  Susceptible     Clarithromycin >16  Resistant1     Doxycycline 4  Intermediate     Imipenem 8  Intermediate2     Linezolid 2  Susceptible     Minocycline 2  Intermediate     Moxifloxacin 0.25  Susceptible     Tigecycline 0.03  No Interpretation     Trimethoprim + Sulfamethoxazole 0.25  Susceptible      2018:  02/19/17: OPF, fungal neg, AFB neg  7/12 LRCx: Aspergillus [place], 3+ Achromobacter species, 3+ Enterococcus faecium  7/10 Sputum fungal culture: Aspergillus [place]  7/9 LRCx: Aspergillus [place], OP flora, Achromobacter spp.    AFB smear x3 negative; cx NGTD      Imaging:   No recent imaging.        The patient reports they are currently: at home. I spent 15 minutes on the real-time audio and video with the patient on the date of service. I spent an additional 20 minutes on pre- and post-visit activities on the date of service.     The patient was physically located in [place] or a state in which I am permitted to provide care. The patient and/or parent/guardian understood that s/he may incur co-pays and cost sharing, and agreed to the telemedicine visit. The visit was reasonable and appropriate under the circumstances given the patient's presentation at the time.    The patient and/or parent/guardian has been advised of the potential risks and limitations of this mode of treatment (including, but not limited to, the absence of in-person examination) and has agreed to be treated using telemedicine. The patient's/patient's family's questions regarding telemedicine have been answered.     If the visit was completed in an ambulatory setting, the patient and/or parent/guardian has also been advised to contact their provider???s office for worsening conditions,  and seek emergency medical treatment and/or call 911 if the patient deems either necessary.

## 2021-11-17 NOTE — Unmapped (Signed)
Lab is for Posaconazole.  Karen Barber states you have always drawn it here for her.  Is this okay?

## 2021-11-17 NOTE — Unmapped (Signed)
Please schedule a morning lab appt.

## 2021-11-17 NOTE — Unmapped (Signed)
Patient calls to schedule lab ordered by Dr. Kathaleen Grinder. Per [facility], outside lab orders are not to be scheduled at [facility] lab. Patient states this is where she usually has labs drawn for Dr. Kathaleen Grinder.    Please call patient to assist.

## 2021-11-18 ENCOUNTER — Other Ambulatory Visit: Admit: 2021-11-18 | Discharge: 2021-11-19 | Payer: MEDICARE

## 2021-11-18 DIAGNOSIS — B441 Other pulmonary aspergillosis: Principal | ICD-10-CM

## 2021-11-18 DIAGNOSIS — Z9189 Other specified personal risk factors, not elsewhere classified: Principal | ICD-10-CM

## 2021-11-18 DIAGNOSIS — J984 Other disorders of lung: Principal | ICD-10-CM

## 2021-11-18 DIAGNOSIS — R0609 Other forms of dyspnea: Principal | ICD-10-CM

## 2021-11-19 ENCOUNTER — Emergency Department
Admit: 2021-11-19 | Discharge: 2021-11-19 | Disposition: A | Discharge status: Home / Self Care | Payer: MEDICARE | Attending: Emergency Medicine

## 2021-11-19 ENCOUNTER — Ambulatory Visit
Admit: 2021-11-19 | Discharge: 2021-11-19 | Disposition: A | Discharge status: Home / Self Care | Payer: MEDICARE | Attending: Emergency Medicine

## 2021-11-19 DIAGNOSIS — R079 Chest pain, unspecified: Principal | ICD-10-CM

## 2021-11-19 DIAGNOSIS — T50905A Adverse effect of unspecified drugs, medicaments and biological substances, initial encounter: Principal | ICD-10-CM

## 2021-11-19 DIAGNOSIS — R0602 Shortness of breath: Principal | ICD-10-CM

## 2021-11-19 LAB — B-TYPE NATRIURETIC PEPTIDE: B-TYPE NATRIURETIC PEPTIDE: 27.08 pg/mL (ref <=100)

## 2021-11-19 LAB — CBC W/ AUTO DIFF
BASOPHILS ABSOLUTE COUNT: 0 10*9/L (ref 0.0–0.1)
BASOPHILS RELATIVE PERCENT: 0.8 %
EOSINOPHILS ABSOLUTE COUNT: 0.1 10*9/L (ref 0.0–0.5)
EOSINOPHILS RELATIVE PERCENT: 2.1 %
HEMATOCRIT: 30.4 % — ABNORMAL LOW (ref 34.0–44.0)
HEMOGLOBIN: 9.8 g/dL — ABNORMAL LOW (ref 11.3–14.9)
LYMPHOCYTES ABSOLUTE COUNT: 0.9 10*9/L — ABNORMAL LOW (ref 1.1–3.6)
LYMPHOCYTES RELATIVE PERCENT: 17.4 %
MEAN CORPUSCULAR HEMOGLOBIN CONC: 32.4 g/dL (ref 32.0–36.0)
MEAN CORPUSCULAR HEMOGLOBIN: 26.3 pg (ref 25.9–32.4)
MEAN CORPUSCULAR VOLUME: 81.2 fL (ref 77.6–95.7)
MEAN PLATELET VOLUME: 7.5 fL (ref 6.8–10.7)
MONOCYTES ABSOLUTE COUNT: 0.7 10*9/L (ref 0.3–0.8)
MONOCYTES RELATIVE PERCENT: 14.6 %
NEUTROPHILS ABSOLUTE COUNT: 3.3 10*9/L (ref 1.8–7.8)
NEUTROPHILS RELATIVE PERCENT: 65.1 %
NUCLEATED RED BLOOD CELLS: 0 /100{WBCs} (ref <=4)
PLATELET COUNT: 344 10*9/L (ref 150–450)
RED BLOOD CELL COUNT: 3.74 10*12/L — ABNORMAL LOW (ref 3.95–5.13)
RED CELL DISTRIBUTION WIDTH: 14.1 % (ref 12.2–15.2)
WBC ADJUSTED: 5.1 10*9/L (ref 3.6–11.2)

## 2021-11-19 LAB — COMPREHENSIVE METABOLIC PANEL
ALBUMIN: 3.1 g/dL — ABNORMAL LOW (ref 3.4–5.0)
ALKALINE PHOSPHATASE: 100 U/L (ref 46–116)
ALT (SGPT): 14 U/L (ref 10–49)
ANION GAP: 2 mmol/L — ABNORMAL LOW (ref 5–14)
AST (SGOT): <8 U/L (ref <=34)
BILIRUBIN TOTAL: 0.3 mg/dL (ref 0.3–1.2)
BLOOD UREA NITROGEN: 12 mg/dL (ref 9–23)
BUN / CREAT RATIO: 12
CALCIUM: 9.3 mg/dL (ref 8.7–10.4)
CHLORIDE: 103 mmol/L (ref 98–107)
CO2: 33.7 mmol/L — ABNORMAL HIGH (ref 20.0–31.0)
CREATININE: 1.03 mg/dL — ABNORMAL HIGH
EGFR CKD-EPI (2021) FEMALE: 63 mL/min/{1.73_m2} (ref >=60)
GLUCOSE RANDOM: 188 mg/dL — ABNORMAL HIGH (ref 70–179)
POTASSIUM: 4 mmol/L (ref 3.4–4.8)
PROTEIN TOTAL: 7.4 g/dL (ref 5.7–8.2)
SODIUM: 139 mmol/L (ref 135–145)

## 2021-11-19 LAB — HIGH SENSITIVITY TROPONIN I - 2 HOUR SERIAL
HIGH SENSITIVITY TROPONIN - DELTA (0-2H): 1 ng/L (ref <=7)
HIGH-SENSITIVITY TROPONIN I - 2 HOUR: 5 ng/L (ref <=34)

## 2021-11-19 LAB — BLOOD GAS, VENOUS
BASE EXCESS VENOUS: 9.9 — ABNORMAL HIGH (ref -2.0–2.0)
HCO3 VENOUS: 31 mmol/L — ABNORMAL HIGH (ref 22–27)
O2 SATURATION VENOUS: 55.9 % (ref 40.0–85.0)
PCO2 VENOUS: 61 mmHg — ABNORMAL HIGH (ref 40–60)
PH VENOUS: 7.37 (ref 7.32–7.43)
PO2 VENOUS: 30 mmHg — ABNORMAL LOW (ref 35–40)

## 2021-11-19 LAB — HIGH SENSITIVITY TROPONIN I - SERIAL: HIGH SENSITIVITY TROPONIN I: 6 ng/L (ref <=34)

## 2021-11-19 LAB — LIPASE: LIPASE: 31 U/L (ref 12–53)

## 2021-11-19 LAB — MAGNESIUM: MAGNESIUM: 1.7 mg/dL (ref 1.6–2.6)

## 2021-11-19 MED ADMIN — ipratropium-albuteroL (DUO-NEB) 0.5-2.5 mg/3 mL nebulizer solution 3 mL: 3 mL | RESPIRATORY_TRACT | @ 16:00:00 | Stop: 2021-11-19

## 2021-11-19 NOTE — Unmapped (Addendum)
Reports L-sided chest pain x 7 days. Recently started new medication for diabetes, had side effects, and stopped taking th medication. Last took insulin this AM. EKG performed in triage. Pt wears 5L via [place] at baseline.

## 2021-11-19 NOTE — Unmapped (Signed)
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 Has previously used Tyvaso liquid form with no side effects. Tyvaso powder discontinued 1 day ago with some improvement in symptoms.    On exam, patient appears uncomfortable especially when coughing. VS unremarkable. Regular rate and rhythm on cardiac exam. Diffuse wheezing bilaterally with R>L. Frequent nonproductive coughing. No peripheral edema.  Normal heart rate and rhythm with distant heart sound likely by body habitus and chronic lung condition.  Grossly normal neurological status. No abdominal pain.    Overall, patient presenting with signs and symptoms most correlate with medication reaction however they could be ACS, would be atypical. Will evaluate with cardiac labs including troponin to evaluate for NSTEMI and myocarditis, BNP to evaluate for CHF and pulmonary edema, CBC to evaluate for anemia causing demand ischemia, chemistry to evaluate for electrolyte abnormalities and renal insufficiency, and lipase to evaluate for pancreatitis. Chest xray to evaluate for other causes of chest pain such as pneumonia, pneumothorax, and screen for widened mediastinum which would suggest aortic dissection, which the story and presentation are not consistent with.  Given previous history of aspergillosis also consider in the differential of possible recurrence versus viral illness versus bacterial URI however lower in the differential given patient with no systemic symptoms such as fever, chills or body aches.    Discussion of Management with other Physicians, QHP or Appropriate Source: N/A  Independent Interpretation of Studies: EKG: Normal sinus rhythm at 84 bpm.  Occasional PVC.  PR 240 ms.  QRS 94.  QTc 437.  First-degree AV block.  No ST elevation or depression.  Diffuse ST wave abnormality similar to prior.  No evidence of acute ischemic changes.  External Records Reviewed: Patient's most recent discharge summary and Patient's most recent outpatient clinic note  Escalation of Care, Consideration of Admission/Observation/Transfer: Consider observation/admission however patient about the stable with significant improvement of symptoms, return to baseline for which would not benefit from hospital admission however had a shared decision with patient about admission versus discharge and at this time she feels comfortable for discharge.  Social determinants that significantly affected care:   Social Determinants of Health     Financial Resource Strain: Low Risk  (04/19/2021)    Overall Financial Resource Strain (CARDIA)    ??? Difficulty of Paying Living Expenses: Not hard at all   Internet Connectivity: Not on file   Food Insecurity: No Food Insecurity (04/19/2021)    Hunger Vital Sign    ??? Worried About Running Out of Food in the Last Year: Never true    ??? Ran Out of Food in the Last Year: Never true   Tobacco Use: Medium Risk (11/16/2021)    Patient History    ??? Smoking Tobacco Use: Former    ??? Smokeless Tobacco Use: Never    ???  Passive Exposure: Not on file   Housing/Utilities: Low Risk  (04/19/2021)    Housing/Utilities    ??? Within the past 12 months, have you ever stayed: outside, in a car, in a tent, in an overnight shelter, or temporarily in someone else's home (i.e. couch-surfing)?: No    ??? Are you worried about losing your housing?: No    ??? Within the past 12 months, have you been unable to get utilities (heat, electricity) when it was really needed?: No   Alcohol Use: Not At Risk (04/19/2021)    Alcohol Use    ??? How often do you have a drink containing alcohol?: Never    ??? How many drinks containing alcohol do you have on a typical day when you are drinking?: Not on file    ??? How often do you have 5 or more drinks on one occasion?: Never   Transportation Needs: No Transportation Needs (04/19/2021)    PRAPARE - Transportation    ??? Lack of Transportation (Medical): No    ??? Lack of Transportation (Non-Medical): No   Substance Use: Low Risk  (04/19/2021)    Substance Use    ??? Taken prescription drugs for non-medical reasons: Never    ??? Taken illegal drugs: Never    ??? Patient indicated they have taken drugs in the past year for non-medical reasons: Yes, [positive answer(s)]: Not on file   Health Literacy: Low Risk  (08/07/2019)    Health Literacy    ??? : Never   Physical Activity: Not on file   Interpersonal Safety: Not on file   Stress: Not on file   Intimate Partner Violence: Not on file   Depression: Not at risk (11/02/2020)    PHQ-2    ??? PHQ-2 Score: 0   Social Connections: Not on file     Prescription drug(s) considered but not prescribed: N/A  Diagnostic tests considered but not performed: N/A  History obtained from other sources: Family    Additional Progress Notes    11:49  VBG consistent with obstructive pattern of pulmonary disease.  Giving initial troponins negative as well has similar EKG lower concern for ACS at this time however will obtain a serial troponin.  Given no leukocytosis, or other systemic symptoms lower concern for acute viral/bacterial/fungal respiratory illness however will obtain an influenza/RSV/COVID swab for additional work-up.    12:06 PM  EKG and troponin not consistent with STEMI or pericarditis. CXR not consistent with aortic dissection, pneumonia, or pneumothorax. Will obtain CT Chest due to chest findings difficult to evaluate on CXR 2/2 longstanding pulmonary disease. CBC not consistent with infectious causes. BNP does not indicate worsened pulmonary hypertension or heart failure.  CMP with no evidence of acute major electrolyte imbalance and creatinine level at baseline of 1.03.    14:12   Repeat EKG consistent with prior and repeat troponin not elevated. CBC shows anemia at 9.8, no leukocytosis. BNP not elevated. Flu/RSV/Covid negative. CT chest unchanged from prior, showing similar appearance of extensive mediastinal and supraclavicular lymphadenopathy, biapical bulla, progressive bronchiectasis and peripheral reticular opacities compared to 2019. Patient feeling better after duo-neb, with decreased oxygen requirements and decreased cough. Feeling reassured as she was previously worried about having a heart attack at home.     14:49  Overall, direct correlation of onset of symptoms with initiation of new medication and symptomatic improvement with medication discontinuation indicates that most likely diagnosis is reaction to Tyvaso inhalation powder. Though she has tolerated a different form of Tyvaso  in the past, this may be a reaction to a component of this formulation. Chest pain appears to be muscular secondary to coughing versus irritation of the lung parenchyma secondary to medication. I discussed my evaluation of the patient's symptoms, my clinical impression, and my proposed outpatient treatment plan.  We have discussed anticipatory guidance, scheduled follow-up, and careful return precautions. I provided an opportunity and answered any questions the patient had. The patient expresses understanding and is comfortable with the discharge plan.          Portions of this record have been created using Scientist, clinical (histocompatibility and immunogenetics). Dictation errors have been sought, but may not have been identified and corrected.    See chart and resident provider documentation for details.    ____________________________________________         History Reason for Visit  Chest Pain      HPI   Karen Barber is a 56 y.o. 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 Patient states that she was prescribed Tyvaso inhalation powder QID for her pulmonary hypertension and started taking it 11/10/21. Around the time of the fourth dose on 8/24, she started to have a cough that became increasingly bothersome and severe as she took more doses of the medication over the next several days. The cough started to hurt more and she developed heart pain in the L upper chest that is constant but worsens significantly with coughing. Her home oxygen requirements increased from 4-6L to 8L after starting Tyvaso. The cough and chest pain has continued to worsen and in the last several days she also developed diffuse soreness, lack of appetite, loose stools, headache, and vomited several days ago. The vomiting was not induced by coughing. She says the medication also increases her blood sugar and has seen sugars up to 500. Due to these effects, she discontinued taking the medication as of 11/18/21 and is feeling somewhat better, with slightly decreased chest pain and no additional vomiting. She took 324mg  aspirin this morning for the pain but has not taken anything else. Has no fevers, chills, nausea. No postural changes in pain or SOB.     Past Medical History:   Diagnosis Date   ??? 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 Breast injury     lung surg on left incision under breast sept 2016   ??? Caregiver burden     parents    ??? Hepatitis C antibody test positive 2016    repeatedly negative HCV RNA, indicating clearance of infection w/o treatment   ??? Hyperlipidemia    ??? Hypertension    ??? 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 On home O2 2008    per Dr Mal Amabile note from 02/21/2017   ??? Pulmonary aspergillosis (CMS-HCC) 2016    A.fumigatus in 2016, prior to LUL lobectomy. A.niger from sputum 08/2016-09/2016.   ??? 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 Sarcoidosis 1995   ??? Type 2 diabetes mellitus with hyperglycemia (CMS-HCC) 07/27/2014   ??? Visual impairment     glasses       Patient Active Problem List   Diagnosis   ??? Pulmonary sarcoidosis (CMS-HCC)   ??? HTN (hypertension)   ??? Aspergillus fumigatus (CMS-HCC)   ??? Type 2 diabetes mellitus with hyperglycemia (CMS-HCC)   ???  Bronchiectasis type 1 (CMS-HCC)   ??? Cavitary lesion of lung   ??? Invasive pulmonary aspergillosis (CMS-HCC)   ??? COPD (chronic obstructive pulmonary disease) (CMS-HCC)   ??? Dyspnea on exertion   ??? Chronic respiratory failure with hypoxia (CMS-HCC)   ??? Chronic pulmonary aspergillosis (CMS-HCC)   ??? Eosinophilic asthma   ??? 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 Eosinophilia   ??? DOE (dyspnea on exertion)   ??? Osteomyelitis of ankle and foot (CMS-HCC)   ??? Pulmonary hypertension (CMS-HCC)   ??? Screening for lipid disorders       Past Surgical History:   Procedure Laterality Date   ??? LUNG LOBECTOMY     ??? PR AMPUTATION TOE,MT-P JT Right 12/12/2019    Procedure: Right foot fifth toe amputation at the metatarsophalangeal joint level;  Surgeon: Britt Bottom, DPM;  Location: MAIN OR [facility];  Service: Vascular   ??? PR AMPUTATION TOE,MT-P JT Right 06/16/2020    Procedure: Right foot amputation of 3rd toe at mpj level;  Surgeon: Britt Bottom, DPM;  Location: MAIN OR [facility];  Service: Vascular   ??? PR RIGHT HEART CATH O2 SATURATION & CARDIAC OUTPUT N/A 04/29/2019    Procedure: Right Heart Catheterization;  Surgeon: Rosana Hoes, MD;  Location: [facility] CATH;  Service: Cardiology   ??? PR THORACOSCOPY SURG TOT PULM DECORT Left 12/14/2014    Procedure: THORACOSCOPY SURG; W/TOT PULM DECORTIC/PNEUMOLYS;  Surgeon: Evert Kohl, MD;  Location: MAIN OR [facility];  Service: Cardiothoracic   ??? PR THORACOSCOPY W/THERA WEDGE RESEXN INITIAL UNILAT Left 12/03/2014    Procedure: THORACOSCOPY, SURGICAL; WITH THERAPEUTIC WEDGE RESECTION (EG, MASS, NODULE) INITIAL UNILATERAL;  Surgeon: Evert Kohl, MD;  Location: MAIN OR [facility];  Service: Cardiothoracic       No current facility-administered medications for this encounter.    Current Outpatient Medications:   ???  ACCU-CHEK FASTCLIX LANCET DRUM Misc, 1 each by Other route Four (4) times a day (before meals and nightly)., Disp: 400 each, Rfl: 1  ???  ACCU-CHEK GUIDE GLUCOSE METER Misc, 1 Device by Other route Four (4) times a day (before meals and nightly). AS DIRECTED, Disp: 1 kit, Rfl: 0  ???  albuterol 2.5 mg /3 mL (0.083 %) nebulizer solution, Inhale 3 mL (2.5 mg total) by nebulization every six (6) hours as needed for wheezing or shortness of breath (airway clearance/cough)., Disp: 360 mL, Rfl: 1  ???  albuterol HFA 90 mcg/actuation inhaler, Inhale 2 puffs every four (4) hours as needed for wheezing or shortness of breath., Disp: 54 g, Rfl: 1  ???  amLODIPine (NORVASC) 5 MG tablet, Take 1 tablet (5 mg total) by mouth daily. Added per patient, Disp: 90 tablet, Rfl: 1  ???  aspirin 325 MG tablet, Take 1 tablet (325 mg total) by mouth daily., Disp: , Rfl:   ???  blood sugar diagnostic Strp, by Other route Three (3) times a day. ACCU-CHEK Guide meter, Disp: 300 strip, Rfl: 1  ???  budesonide-formoteroL (SYMBICORT) 160-4.5 mcg/actuation inhaler, Inhale 2 puffs Two (2) times a day., Disp: 10.2 g, Rfl: 3  ???  cyclobenzaprine (FLEXERIL) 10 MG tablet, Take 1 tablet (10 mg total) by mouth Three (3) times a day as needed., Disp: 90 tablet, Rfl: 2  ???  furosemide (LASIX) 20 MG tablet, Take 1 tablet (20 mg total) by mouth daily as needed., Disp: 90 tablet, Rfl: 0  ???  gabapentin (NEURONTIN) 300 MG capsule, Take 1 capsule (300 mg total) by mouth  Three (3) times a day., Disp: 270 capsule, Rfl: 1  ???  insulin ASPART (NOVOLOG U-100 INSULIN ASPART) 100 unit/mL injection, Take 1-20 units SQ QID AC and HS as directed, Disp: 20 mL, Rfl: 1  ???  insulin glargine (BASAGLAR, LANTUS) 100 unit/mL (3 mL) injection pen, Inject 0.24 mL (24 Units total) under the skin nightly., Disp: 21 mL, Rfl: 1  ???  ipratropium (ATROVENT) 0.02 % nebulizer solution, Inhale 2.5 mL (500 mcg total) by nebulization four (4) times a day., Disp: 900 mL, Rfl: 1  ???  MEDICAL SUPPLY ITEM, Accucheck Glide Glocumeter for home glucose checks. Check blood glucose ACHS., Disp: 1 Package, Rfl: 0  ???  mepolizumab (NUCALA) 100 mg/mL AtIn, Inject the contents of 1 pen (100 mg total) under the skin every 28 (twenty-eight) days Allow to sit at room temperature for 30 minutes prior to administration., Disp: 1 mL, Rfl: 2  ???  miscellaneous medical supply Misc, Nebulizer machine and tubing. Patients machine is 58 years old., Disp: 1 each, Rfl: 0  ???  OXYGEN-AIR DELIVERY SYSTEMS MISC, Dose: 2-3 LPM, Disp: , Rfl:   ???  posaconazole (NOXAFIL) 100 mg delayed released tablet, Take 2 tablets (200 mg total) by mouth daily., Disp: 60 tablet, Rfl: 3  ???  sildenafiL, pulm.hypertension, (REVATIO) 20 mg tablet, Take 1 tablet (20 mg total) by mouth Three (3) times a day., Disp: 270 tablet, Rfl: 0  ???  treprostiniL (TYVASO) 1.74 mg/2.9 mL (0.6 mg/mL) Nebu, 3 puffs (18 mcg total) by via Home Nebulizer route four (4) times a day., Disp: 10.8 mL, Rfl: 1    Allergies  Patient has no known allergies.    Family History   Problem Relation Age of Onset   ??? Diabetes Father    ??? Diabetes Brother    ??? Diabetes Maternal Aunt    ??? Sarcoidosis Maternal Aunt    ??? Diabetes Maternal Uncle    ??? Diabetes Paternal Aunt    ??? Diabetes Paternal Uncle        Social History  Social History     Tobacco Use   ??? Smoking status: Former     Packs/day: 0.50     Years: 9.00     Additional pack years: 0.00     Total pack years: 4.50     Types: Cigarettes     Quit date: 05/03/1987     Years since quitting: 34.5   ??? Smokeless tobacco: Never   Vaping Use   ??? Vaping Use: Never used Substance Use Topics   ??? Alcohol use: No     Alcohol/week: 0.0 standard drinks of alcohol   ??? Drug use: No          Physical Exam     This provider entered the patient's room: Yes:    ??? If this provider did not enter the room, a comprehensive physical exam was not able to be performed due to increased infection risk to themselves, other providers, staff and other patients), as well as to conserve personal protective equipment (PPE) utilization during the COVID-19 pandemic.    ??? If this provider did enter the patient room, the following was PPE worn: Surgical mask, eye protection and gloves     ED Triage Vitals [11/19/21 1034]   Enc Vitals Group      BP 130/81      Heart Rate 82      SpO2 Pulse       Resp 18      Temp  37 ??C (98.6 ??F)      Temp Source Oral      SpO2 100 %     Constitutional: Alert and oriented.  Slightly uncomfortable appearing with multiple episodes of coughing spell although in no distress, on nasal cannula.  Eyes: Conjunctivae are normal.  ENT       Head: Normocephalic and atraumatic.       Mouth/Throat: Mucous membranes are moist.       Neck: No stridor.  Cardiovascular: Normal rate, regular rhythm. Normal and symmetric distal pulses are present in all extremities.  Respiratory: Increased work of breathing with frequent coughing spells. Diffuse wheezes R>L. No breath sounds in LUL- s/p lobectomy.   Gastrointestinal: Soft and nontender.  Musculoskeletal: Normal range of motion in all extremities.       Right lower leg: No tenderness or edema.       Left lower leg: No tenderness or edema.  Neurologic: Normal speech and language. No gross focal neurologic deficits are appreciated.  Skin: Skin is warm, dry and intact. No rash noted.  Psychiatric: Mood and affect are normal. Speech and behavior are normal.       Radiology     CT Chest Wo Contrast   Final Result      Unenhanced CT imaging of the chest demonstrates no new findings to correlate with the patient's acute presentation. However, quite similar to CT imaging from November 2019, this study again demonstrates a large burden of calcified and noncalcified supraclavicular, mediastinal, and hilar lymphadenopathy as well as extensive architectural distortion in both lungs and scattered areas of honeycombing. Taken together, the findings are most suggestive of end stage sarcoidosis.                  XR Chest Portable   Final Result   Chronic findings as described with large heart, bullous changes, scarring/atelectasis, pleural effusions and prominence of the mediastinum and peritracheal region. CT imaging would be most informative if more definitive characterization of above described findings would affect clinical management.              Adaliah Hiegel Luciano-Feijoo', MD  11/19/21 1650

## 2021-11-21 LAB — POSACONAZOLE: POSACONAZOLE LEVEL: 1060 ng/mL

## 2021-11-25 NOTE — Unmapped (Signed)
PA SILDENAFIL INITIATED ELECTRONICALLY VIA COVER MY MEDS

## 2021-11-25 NOTE — Unmapped (Signed)
PA SILDENAFIL APPROVED:

## 2021-12-08 NOTE — Unmapped (Signed)
[facility] Clinical Assessment & Refill Coordination Note    [name] stopped Nucala per provider and denies experiencing any worsening respiratory symptoms at this time. She states she has a follow-up appt next month with pulmonologist for continued monitoring.     Karen [facility], DOB: November 12, 1965  Phone: [phone] (home)     All above HIPAA information was verified with patient.     Was a Nurse, learning disability used for this call? No    Specialty Medication(s):   Infectious Disease: posaconazole     Current Outpatient Medications   Medication Sig Dispense Refill    ACCU-CHEK FASTCLIX LANCET DRUM Misc 1 each by Other route Four (4) times a day (before meals and nightly). 400 each 1    ACCU-CHEK GUIDE GLUCOSE METER Misc 1 Device by Other route Four (4) times a day (before meals and nightly). AS DIRECTED 1 kit 0    albuterol 2.5 mg /3 mL (0.083 %) nebulizer solution Inhale 3 mL (2.5 mg total) by nebulization every six (6) hours as needed for wheezing or shortness of breath (airway clearance/cough). 360 mL 1    albuterol HFA 90 mcg/actuation inhaler Inhale 2 puffs every four (4) hours as needed for wheezing or shortness of breath. 54 g 1    amLODIPine (NORVASC) 5 MG tablet Take 1 tablet (5 mg total) by mouth daily. Added per patient 90 tablet 1    aspirin 325 MG tablet Take 1 tablet (325 mg total) by mouth daily.      blood sugar diagnostic Strp by Other route Three (3) times a day. ACCU-CHEK Guide meter 300 strip 1    budesonide-formoteroL (SYMBICORT) 160-4.5 mcg/actuation inhaler Inhale 2 puffs Two (2) times a day. 10.2 g 3    cyclobenzaprine (FLEXERIL) 10 MG tablet Take 1 tablet (10 mg total) by mouth Three (3) times a day as needed. 90 tablet 2    furosemide (LASIX) 20 MG tablet Take 1 tablet (20 mg total) by mouth daily as needed. 90 tablet 0    gabapentin (NEURONTIN) 300 MG capsule Take 1 capsule (300 mg total) by mouth Three (3) times a day. 270 capsule 1    insulin ASPART (NOVOLOG U-100 INSULIN ASPART) 100 unit/mL injection Take 1-20 units SQ QID AC and HS as directed 20 mL 1    insulin glargine (BASAGLAR, LANTUS) 100 unit/mL (3 mL) injection pen Inject 0.24 mL (24 Units total) under the skin nightly. 21 mL 1    ipratropium (ATROVENT) 0.02 % nebulizer solution Inhale 2.5 mL (500 mcg total) by nebulization four (4) times a day. 900 mL 1    MEDICAL SUPPLY ITEM Accucheck Glide Glocumeter for home glucose checks. Check blood glucose ACHS. 1 Package 0    mepolizumab (NUCALA) 100 mg/mL AtIn Inject the contents of 1 pen (100 mg total) under the skin every 28 (twenty-eight) days Allow to sit at room temperature for 30 minutes prior to administration. 1 mL 2    miscellaneous medical supply Misc Nebulizer machine and tubing. Patients machine is 58 years old. 1 each 0    OXYGEN-AIR DELIVERY SYSTEMS MISC Dose: 2-3 LPM      posaconazole (NOXAFIL) 100 mg delayed released tablet Take 2 tablets (200 mg total) by mouth daily. 60 tablet 3    sildenafiL, pulm.hypertension, (REVATIO) 20 mg tablet Take 1 tablet (20 mg total) by mouth Three (3) times a day. 270 tablet 0    treprostiniL (TYVASO) 1.74 mg/2.9 mL (0.6 mg/mL) Nebu 3 puffs (  18 mcg total) by via Home Nebulizer route four (4) times a day. 10.8 mL 1     No current facility-administered medications for this visit.        Changes to medications: Karen Barber reports no changes at this time.    No Known Allergies    Changes to allergies: No    SPECIALTY MEDICATION ADHERENCE     Posaconazole 100 mg: unsure but ~5 days of medicine on hand     Medication Adherence    Patient reported X missed doses in the last month: 0  Specialty Medication: Posaconazole 100 mg - 2 tabs daily  Patient is on additional specialty medications: No  Patient is on more than two specialty medications: No  Any gaps in refill history greater than 2 weeks in the last 3 months: no  Demonstrates understanding of importance of adherence: yes  Informant: patient Specialty medication(s) dose(s) confirmed: Regimen is correct and unchanged.     Are there any concerns with adherence? No    Adherence counseling provided? Not needed    CLINICAL MANAGEMENT AND INTERVENTION      Clinical Benefit Assessment:    Do you feel the medicine is effective or helping your condition? Yes    Clinical Benefit counseling provided? Not needed    Adverse Effects Assessment:    Are you experiencing any side effects? No    Are you experiencing difficulty administering your medicine? 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 For example, brushing your teeth or getting up in the morning. Patient declined to answer    Have you discussed this with your provider? Not needed    Acute Infection Status:    Acute infections noted within Epic:  No active infections  Patient reported infection: None    Therapy Appropriateness:    Is therapy appropriate and patient progressing towards therapeutic goals? Yes, therapy is appropriate and should be continued    DISEASE/MEDICATION-SPECIFIC INFORMATION      N/A    PATIENT SPECIFIC NEEDS     Does the patient have any physical, cognitive, or cultural barriers? No    Is the patient high risk? No    Does the patient require a Care Management Plan? No     SOCIAL DETERMINANTS OF HEALTH     At the [facility], we have learned that life circumstances - like trouble affording food, housing, utilities, or transportation can affect the health of many of our patients.   That is why we wanted to ask: are you currently experiencing any life circumstances that are negatively impacting your health and/or quality of life? Patient declined to answer    Social Determinants of Health     Financial Resource Strain: Low Risk  (04/19/2021)    Overall Financial Resource Strain (CARDIA) Difficulty of Paying Living Expenses: Not hard at all   Internet Connectivity: Not on file   Food Insecurity: No Food Insecurity (04/19/2021)    Hunger Vital Sign     Worried About Running Out of Food in the Last Year: Never true     Ran Out of Food in the Last Year: Never true   Tobacco Use: Medium Risk (11/16/2021)    Patient History     Smoking  Tobacco Use: Former     Smokeless Tobacco Use: Never     Passive Exposure: Not on file   Housing/Utilities: Low Risk  (04/19/2021)    Housing/Utilities     Within the past 12 months, have you ever stayed: outside, in a car, in a tent, in an overnight shelter, or temporarily in someone else's home (i.e. couch-surfing)?: No     Are you worried about losing your housing?: No     Within the past 12 months, have you been unable to get utilities (heat, electricity) when it was really needed?: No   Alcohol Use: Not At Risk (04/19/2021)    Alcohol Use     How often do you have a drink containing alcohol?: Never     How many drinks containing alcohol do you have on a typical day when you are drinking?: Not on file     How often do you have 5 or more drinks on one occasion?: Never   Transportation Needs: No Transportation Needs (04/19/2021)    PRAPARE - Transportation     Lack of Transportation (Medical): No     Lack of Transportation (Non-Medical): No   Substance Use: Low Risk  (04/19/2021)    Substance Use     Taken prescription drugs for non-medical reasons: Never     Taken illegal drugs: Never     Patient indicated they have taken drugs in the past year for non-medical reasons: Yes, [positive answer(s)]: Not on file   Health Literacy: Low Risk  (08/07/2019)    Health Literacy     : Never   Physical Activity: Not on file   Interpersonal Safety: Not on file   Stress: Not on file   Intimate Partner Violence: Not on file   Depression: Not at risk (11/02/2020)    PHQ-2     PHQ-2 Score: 0   Social Connections: Not on file       Would you be willing to receive help with any of the needs that you have identified today? Not applicable       SHIPPING     Specialty Medication(s) to be Shipped:   Infectious Disease: posaconazole    Other medication(s) to be shipped: No additional medications requested for fill at this time     Changes to insurance: No    Delivery Scheduled: Yes, Expected medication delivery date: 12/09/21.     Medication will be delivered via Same Day Courier to the confirmed prescription address in [place].    The patient will receive a drug information handout for each medication shipped and additional FDA Medication Guides as required.  Verified that patient has previously received a Conservation officer, historic buildings and a Surveyor, mining.    The patient or caregiver noted above participated in the development of this care plan and knows that they can request review of or adjustments to the care plan at any time.      All of the patient's questions and concerns have been addressed.    Oliva Bustard   [facility] Specialty Pharmacist

## 2021-12-09 MED FILL — POSACONAZOLE 100 MG TABLET,DELAYED RELEASE: ORAL | 30 days supply | Qty: 60 | Fill #3

## 2021-12-26 DIAGNOSIS — B441 Other pulmonary aspergillosis: Principal | ICD-10-CM

## 2021-12-26 MED ORDER — IPRATROPIUM BROMIDE 0.02 % SOLUTION FOR INHALATION
0 refills | 0 days
Start: 2021-12-26 — End: ?

## 2021-12-26 MED ORDER — POSACONAZOLE 100 MG TABLET,DELAYED RELEASE
ORAL_TABLET | Freq: Every day | ORAL | 3 refills | 30 days | Status: CP
Start: 2021-12-26 — End: ?
  Filled 2022-02-02: qty 60, 30d supply, fill #0

## 2021-12-26 NOTE — Unmapped (Signed)
Call from patient preferred [facility] pharmacy requesting new Rx for Atrovent.

## 2021-12-27 MED ORDER — IPRATROPIUM BROMIDE 0.02 % SOLUTION FOR INHALATION
Freq: Four times a day (QID) | RESPIRATORY_TRACT | 1 refills | 36.00000 days | Status: CP
Start: 2021-12-27 — End: 2022-12-27

## 2021-12-27 NOTE — Unmapped (Signed)
Patient is requesting the following refill  Requested Prescriptions     Pending Prescriptions Disp Refills    ipratropium (ATROVENT) 0.02 % nebulizer solution 360 mL 1     Sig: Inhale 2.5 mL (500 mcg total) by nebulization four (4) times a day.     Signed Prescriptions Disp Refills    ipratropium (ATROVENT) 0.02 % nebulizer solution 360 mL 1     Sig: Inhale 2.5 mL (500 mcg total) by nebulization four (4) times a day.     Authorizing Provider: Etheleen Nicks [facility]     Ordering User: Barbaraann Boys       Recent Visits  Date Type Provider Dept   04/19/21 Office Visit Loran Senters, FNP [facility]   Showing recent visits within past 365 days with a meds authorizing provider and meeting all other requirements  Future Appointments  No visits were found meeting these conditions.  Showing future appointments within next 365 days with a meds authorizing provider and meeting all other requirements     Over 6 months since last visit.

## 2021-12-27 NOTE — Unmapped (Signed)
In refill basket.  Karen [facility] needs to approve since over 6 months since last visit.

## 2022-01-10 NOTE — Unmapped (Signed)
The [facility] has made a third and final attempt to reach this patient to refill the following medication:posaconazole 100 mg delayed released tablet (NOXAFIL).      We have left voicemail with Karen Barber at the following phone numbers: [phone] and have sent a Mychart questionnaire..    Dates contacted: 10/09    10/18   10/24  Last scheduled delivery: 12/09/21    The patient may be at risk of non-compliance with this medication. The patient should call the [facility] at [phone]  Option 4, then Option 2 (all other specialty patients) to refill medication.    [facility] [place]   [facility]

## 2022-01-11 ENCOUNTER — Ambulatory Visit: Admit: 2022-01-11 | Payer: MEDICARE | Attending: Hematology & Oncology | Primary: Hematology & Oncology

## 2022-01-19 MED ORDER — INSULIN GLARGINE (U-100) 100 UNIT/ML (3 ML) SUBCUTANEOUS PEN
Freq: Every evening | SUBCUTANEOUS | 0 refills | 87 days | Status: CP
Start: 2022-01-19 — End: 2023-01-19

## 2022-01-19 NOTE — Unmapped (Signed)
Call from [facility] with [facility] states patient wants to start care next week and they need the okay. Please call Maureen Ralphs at # [phone].

## 2022-01-19 NOTE — Unmapped (Signed)
Left message for Karen Barber to call.

## 2022-01-20 NOTE — Unmapped (Addendum)
Karen Barber states she will be starting home health next week for pulmonary hypertension.  Left verbal okay at number given for [facility].

## 2022-01-24 NOTE — Unmapped (Signed)
Appointment canceled for Karen Barber ([id number])  Visit Type: RETURN CARE TRANSITIONS [place]  Date        Time      Length    Provider                  Department  01/24/2022    2:40 PM  40 mins.  Tessie Eke, MD     [facility] [address] [facility]     Reason for Cancellation: Canceled via MyChart     Patient Comments: I'm not able to keep this appointment because my feet are both very swollen and it's painful, I have not been able to get my med for edema because of a mistype on my changed dose and amount, but I'm hoping that it will be corrected today, I'll reschedule.    Called [name], no answer and her mailbox is full.

## 2022-01-25 DIAGNOSIS — J449 Chronic obstructive pulmonary disease, unspecified: Principal | ICD-10-CM

## 2022-01-25 DIAGNOSIS — D869 Sarcoidosis, unspecified: Principal | ICD-10-CM

## 2022-01-25 MED ORDER — ALBUTEROL SULFATE 2.5 MG/3 ML (0.083 %) SOLUTION FOR NEBULIZATION
Freq: Four times a day (QID) | RESPIRATORY_TRACT | 0 refills | 30 days | Status: CP | PRN
Start: 2022-01-25 — End: 2023-01-25

## 2022-01-25 NOTE — Unmapped (Signed)
Tried calling Karen Barber again, no answer and full mailbox.

## 2022-01-26 NOTE — Unmapped (Signed)
Karen Barber said the ER doctor increased Furosemide to 40 mg twice daily.  Lung doctor sent in for her yesterday.  She said her sx have improved.

## 2022-01-26 NOTE — Unmapped (Signed)
Deniece Ree 's Posaconazole shipment will be delayed as a result of the medication is too soon to refill until 11/16.     I have reached out to the patient  at [phone] and communicated the delivery change. We will reschedule the medication for the delivery date that the patient agreed upon.  We have confirmed the delivery date as 11/17, via next day courier.

## 2022-02-28 NOTE — Unmapped (Signed)
The [facility] has made a third and final attempt to reach this patient to refill the following medication:posaconazole 100 mg delayed released tablet (NOXAFIL).      We have left voicemails on the following phone numbers: [phone], have sent a text message to the following phone numbers: [phone], and have sent a Mychart questionnaire..    Dates contacted: 11/28     12/05    12/12  Last scheduled delivery: 02/02/22    The patient may be at risk of non-compliance with this medication. The patient should call the [facility] at [phone]  Option 4, then Option 2 (all other specialty patients) to refill medication.    Karen [facility]   [facility]

## 2022-03-21 MED ORDER — INSULIN GLARGINE (U-100) 100 UNIT/ML (3 ML) SUBCUTANEOUS PEN
Freq: Every evening | SUBCUTANEOUS | 0 refills | 90 days | Status: CP
Start: 2022-03-21 — End: 2023-03-21

## 2022-03-22 NOTE — Unmapped (Signed)
Patient needs appointment, LV 04/19/21

## 2022-04-17 NOTE — Unmapped (Signed)
[place] Assessment of Medications Program (CAMP)  Comprehensive Medication Management RECRUITMENT SUMMARY NOTE     CAMP is a team of pharmacists and pharmacy technicians within [facility] that supports patients and providers.     Patient was identified for CAMP services based on criteria including, 59 years of age or older, with a recent managing provider visit, and   10 or more chronic medications     A letter has been sent explaining program services.      Genice Kimberlin [place], CPhT  Certified Pharmacy Technician   [place] Assessment of Medications Program (CAMP)   P [phone], F [phone]

## 2022-05-01 NOTE — Unmapped (Signed)
[place] Assessment of Medications Program (CAMP)  Comprehensive Medication Management  RECRUITMENT SUMMARY NOTE     Patient was outreached for CAMP Services. Unable to contact- unable to leave message.  The person you are calling is not accepting calls at this time       Alberteen Sam, CPhT  Certified Pharmacy Technician   [place] Assessment of Medications Program (CAMP)   P [phone], F [phone]

## 2022-05-05 NOTE — Unmapped (Signed)
[place] Assessment of Medications Program (CAMP)  Comprehensive Medication Management  RECRUITMENT SUMMARY NOTE     Patient was outreached for CAMP Services. Unable to contact- unable to leave message. Unable to contact letter sent. Patient was unable to be contacted despite multiple attempts. If patient requires pharmacist intervention in the future, please send a referral to CAMP using the AMB Referral to [place] Assessment of Medication Program.      Consuella Scurlock [place], CPhT  Certified Pharmacy Technician   [place] Assessment of Medications Program (CAMP)   P [phone], F [phone]

## 2022-05-11 NOTE — Unmapped (Signed)
Assessment and Plan:       Screening for lipid disorders  - Lipid Panel    Type 2 diabetes mellitus with hyperglycemia, with long-term current use of insulin (CMS-HCC)  Patient with controlled diabetes. Discussed with patient freestyle libre continuous glucose monitor. Discussed importance of a low carb diet. Patient follows with [facility] for her diabetic eye exams. Foot exam completed today. Ordered A1c. Will contact patient with results. Continue glargine insulin and sliding scale insulin regimen.   - Ambulatory referral to Ophthalmology; Future  - Hemoglobin A1c  - FreeStyle Libre CGM     Sarcoid  - albuterol HFA 90 mcg/actuation inhaler; Inhale 2 puffs every four (4) hours as needed for wheezing or shortness of breath.  - albuterol 2.5 mg /3 mL (0.083 %) nebulizer solution; Inhale 3 mL (2.5 mg total) by nebulization every six (6) hours as needed for wheezing or shortness of breath (airway clearance/cough).    Chronic obstructive pulmonary disease, unspecified COPD type (CMS-HCC)  Patient with controlled COPD and sarcoidosis. Continue to follow pulmonology guidance with medication and oxygen (5-6 L). Refilled albuterol inhaler and nebulizer solution today.   - albuterol 2.5 mg /3 mL (0.083 %) nebulizer solution; Inhale 3 mL (2.5 mg total) by nebulization every six (6) hours as needed for wheezing or shortness of breath (airway clearance/cough).    Screening for colon cancer  - Immunochemical Fecal Occult Blood Test (FIT), automated    Encounter for screening mammogram for malignant neoplasm of breast  - Mammography screening bilateral; Future    Depression, unspecified depression type  Recent depressed mood in setting of familial stressors. PHQ-9 consistent wit moderate depression. Discussed depressed mood, and trying to take care of herself. Trial Lexapro 5 mg daily for a week and then start 10 mg daily. If any side effects occur or symptoms worsen or persist contact this provider.   - escitalopram oxalate (LEXAPRO) 10 MG tablet; Take 1 tablet (10 mg total) by mouth daily. Take 1/2 tablet (5 mg) daily for 14 days, then increase to 10 mg (1 tablet) daily.       I personally spent 40 minutes face-to-face and non-face-to-face in the care of this patient, which includes all pre, intra, and post visit time on the date of service.    Return in about 3 months (around 08/14/2022) for Next scheduled follow up.    HPI:      Karen Barber is here for   Chief Complaint   Patient presents with    Diabetes     Non-fasting.  She will schedule her diabetic eye exam at [facility].    Hypertension    Breast cancer screening     She agrees to schedule her mammo through [facility].    Colon Cancer Screening     She agrees to do FIT test.      Immunizations     Agrees to get Prevnar 20.  Discussed TDAP and Shingrix.    Pap reminder     [name] reminded she is overdue for her pap.     Patient says that she in currently in the wheelchair because she bumped her knee and it hurts to walk. She typically uses a scooter because she gets out of breath when walking. Her partner says she is walking around the house more. Patient was hospitalized with double pneumonia in October. Patient is following up with pulmonology every three months. Patient is currently using 5 L of oxygen, when she is  exerting herself she up to 6-7 L. She is also following up with cardiology every three months.     Depression: Patient bring up concerns of depression, which began after her mother passed away in 01-10-2024. She states that she prefers to sleep all of the time to avoid stressful situations.  She takes sleeping pills to try and help herself sleep more. Patient request a medication for mood stabilization.     Diabetes: Patient is managing with Basaglar 18 units nightly and Novolog sliding scale insulin. Patient checks her insulin fasting as well as prandially. Her blood sugars range between 120-140. Patient has had two toes amputated. Denies chest pain, SOB, edema, increased thirst/hunger/urination. Endorses vision changes for reading.     Hypertension: Patient presents for follow-up of hypertension. Blood pressure goal < 140/90.  Hypertension has customarily been at goal complicated by DM. Home blood pressure readings: did not bring log. Medication compliance: taking sildenafil 20 mg TID and amlodipine 5 mg daily as prescribed. She is not doing regular exercise.            ROS:      Comprehensive 10 point ROS negative unless otherwise stated in the HPI.      [facility] Components:     Medication adherence and barriers to the treatment plan have been addressed. Opportunities to optimize healthy behaviors have been discussed. Patient / caregiver voiced understanding.    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 A.niger from sputum 08/2016-09/2016.    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    Sarcoidosis 1995    Type 2 diabetes mellitus with hyperglycemia (CMS-HCC) 07/27/2014    Visual impairment     glasses     Past Surgical History:   Procedure Laterality Date    LUNG LOBECTOMY      PR AMPUTATION TOE,MT-P JT Right 12/12/2019    Procedure: Right foot fifth toe amputation at the metatarsophalangeal joint level;  Surgeon: Britt Bottom, DPM;  Location: MAIN OR [facility];  Service: Vascular    PR AMPUTATION TOE,MT-P JT Right 06/16/2020    Procedure: Right foot amputation of 3rd toe at mpj level;  Surgeon: Britt Bottom, DPM;  Location: MAIN OR [facility];  Service: Vascular    PR RIGHT HEART CATH O2 SATURATION & CARDIAC OUTPUT N/A 04/29/2019    Procedure: Right Heart Catheterization;  Surgeon: Rosana Hoes, MD;  Location: [facility] CATH;  Service: Cardiology    PR THORACOSCOPY SURG TOT PULM DECORT Left 12/14/2014    Procedure: THORACOSCOPY SURG; W/TOT PULM DECORTIC/PNEUMOLYS;  Surgeon: Evert Kohl, MD;  Location: MAIN OR [facility];  Service: Cardiothoracic    PR THORACOSCOPY W/THERA WEDGE RESEXN INITIAL UNILAT Left 12/03/2014    Procedure: Augustine Radar,  SURGICAL; WITH THERAPEUTIC WEDGE RESECTION (EG, MASS, NODULE) INITIAL UNILATERAL;  Surgeon: Evert Kohl, MD;  Location: MAIN OR [facility];  Service: Cardiothoracic       Family History:     Family History   Problem Relation Age of Onset    Dementia Mother     Diabetes Father     Diabetes Brother     Diabetes Maternal Aunt     Sarcoidosis Maternal Aunt     Diabetes Maternal Uncle     Diabetes Paternal Aunt     Diabetes Paternal Uncle        Social History:     Social History     Tobacco Use    Smoking status: Former     Current packs/day: 0.00     Average packs/day: 0.5 packs/day for 9.0 years (4.5 ttl pk-yrs)     Types: Cigarettes     Start date: 05/02/1978     Quit date: 05/03/1987     Years since quitting: 35.0    Smokeless tobacco: Never   Vaping Use    Vaping status: Never Used   Substance Use Topics    Alcohol use: No     Alcohol/week: 0.0 standard drinks of alcohol    Drug use: No       Allergies:     Patient has no known allergies.    Current Medications:     Current Outpatient Medications   Medication Sig Dispense Refill    ACCU-CHEK FASTCLIX LANCET DRUM Misc 1 each by Other route Four (4) times a day (before meals and nightly). 400 each 1    ACCU-CHEK GUIDE GLUCOSE METER Misc 1 Device by Other route Four (4) times a day (before meals and nightly). AS DIRECTED 1 kit 0    aspirin 325 MG tablet Take 1 tablet (325 mg total) by mouth daily.      aspirin-acetaminophen-caffeine (EXCEDRIN MIGRAINE) 250-250-65 mg per tablet Take by mouth every hour as needed.      blood sugar diagnostic Strp by Other route Three (3) times a day. ACCU-CHEK Guide meter 300 strip 1    budesonide-formoteroL (SYMBICORT) 160-4.5 mcg/actuation inhaler Inhale 2 puffs Two (2) times a day. 10.2 g 3    cyclobenzaprine (FLEXERIL) 10 MG tablet Take 1 tablet (10 mg total) by mouth Three (3) times a day as needed. 90 tablet 2    furosemide (LASIX) 40 MG tablet Take 1 tablet (40 mg total) by mouth two (2) times a day.      insulin glargine (BASAGLAR KWIKPEN U-100 INSULIN) 100 unit/mL (3 mL) injection pen Inject 0.24 mL (24 Units total) under the skin nightly. (Patient taking differently: Inject 0.18 mL (18 Units total) under the skin nightly.) 21.6 mL 0    OXYGEN-AIR DELIVERY SYSTEMS MISC Dose: 2-3 LPM      posaconazole (NOXAFIL) 100 mg delayed released tablet Take 2 tablets (200 mg total) by mouth daily. 60 tablet 3    sildenafiL, pulm.hypertension, (REVATIO) 20 mg tablet Take 1 tablet (20 mg total) by mouth Three (3) times a day. 270 tablet 0    albuterol 2.5 mg /3 mL (0.083 %) nebulizer solution Inhale 3 mL (2.5 mg total) by nebulization every six (6) hours as needed for wheezing or shortness of breath (airway clearance/cough). 360 mL 3    albuterol HFA 90 mcg/actuation inhaler Inhale 2 puffs every four (4) hours as needed for wheezing or shortness of breath. 54 g 3    amlodipine (NORVASC) 5 MG tablet  Take 1 tablet (5 mg total) by mouth daily. Added per patient 90 tablet 3    escitalopram oxalate (LEXAPRO) 10 MG tablet Take 1 tablet (10 mg total) by mouth daily. Take 1/2 tablet (5 mg) daily for 14 days, then increase to 10 mg (1 tablet) daily. 90 tablet 1    gabapentin (NEURONTIN) 300 MG capsule Take 1 capsule (300 mg total) by mouth Three (3) times a day. 270 capsule 3    insulin aspart (NOVOLOG U-100 INSULIN ASPART) 100 unit/mL vial Take 1-20 units SQ QID AC and HS as directed 20 mL 1    ipratropium (ATROVENT) 0.02 % nebulizer solution Inhale 2.5 mL (500 mcg total) by nebulization four (4) times a day. 360 mL 3    miscellaneous medical supply Misc Nebulizer machine and tubing. Patients machine is 59 years old. 1 each 0     No current facility-administered medications for this visit.       Health Maintenance:     Health Maintenance   Topic Date Due    HPV Cotest with Pap Smear (21-65)  Never done    Pap Smear with Cotest HPV (21-65)  02/27/2001    Zoster Vaccines (1 of 2) Never done    DTaP/Tdap/Td Vaccines (2 - Td or Tdap) 07/07/2017    Colon Cancer Screening  07/18/2017    Lipid Screening  11/05/2017    Mammogram Start Age 67  03/01/2019    Retinal Eye Exam  11/15/2019    COVID-19 Vaccine (5 - 2023-24 season) 11/18/2021    COPD Spirometry  01/04/2022    Hemoglobin A1c  04/04/2022    Urine Albumin/Creatinine Ratio  01/02/2023    Serum Creatinine Monitoring  02/02/2023    Potassium Monitoring  02/02/2023    Foot Exam  05/17/2023    Pneumococcal Vaccine 0-64  Completed    Hepatitis C Screen  Completed    Influenza Vaccine  Completed       Immunizations:     Immunization History   Administered Date(s) Administered    COVID-19 VAC,BIVALENT(90YR UP),PFIZER 06/29/2021    COVID-19 VACC,MRNA,(PFIZER)(PF) 06/11/2019, 07/02/2019, 11/21/2019    INFLUENZA INJ MDCK PF, QUAD,(FLUCELVAX)(14MO AND UP EGG FREE) 01/19/2021    INFLUENZA TIV (TRI) PF (IM) 01/02/2005, 12/14/2008    Influenza Vaccine Quad(IM)6 MO-Adult(PF) 11/28/2012, 12/19/2014, 12/16/2015, 01/03/2017, 12/06/2017, 01/08/2019, 12/15/2019, 12/21/2021    PNEUMOCOCCAL POLYSACCHARIDE 23-VALENT 07/08/2007, 01/03/2017    PPD Test 01/19/2015    Pneumococcal Conjugate 20-valent 05/16/2022    TdaP 07/08/2007     I have reviewed and (if needed) updated the patient's problem list, medications, allergies, past medical and surgical history, social and family history.     Vital Signs:     Wt Readings from Last 3 Encounters: 05/16/22 89.4 kg (197 lb)   06/29/21 94.8 kg (208 lb 15.9 oz)   04/19/21 94.8 kg (209 lb)     Temp Readings from Last 3 Encounters:   05/16/22 36.9 ??C (98.4 ??F) (Oral)   11/19/21 37 ??C (98.6 ??F) (Oral)   06/29/21 36.8 ??C (98.2 ??F) (Oral)     BP Readings from Last 3 Encounters:   05/16/22 112/76   11/19/21 130/81   06/29/21 140/85     Pulse Readings from Last 3 Encounters:   05/16/22 90   11/19/21 82   06/29/21 83     Estimated body mass index is 27.48 kg/m?? as calculated from the following:    Height as of this encounter: 180.3 cm (5' 11).    Weight  as of this encounter: 89.4 kg (197 lb).  Facility age limit for growth %iles is 20 years.      Objective:      General: Alert and oriented x3. Well-appearing. No acute distress. Riding in wheelchair.   HEENT:  Normocephalic.  Atraumatic. Conjunctiva and sclera normal.  Neck:  Supple.   Heart:  Regular rate and rhythm. Normal S1, S2. No murmurs, rubs or gallops.   Lungs:  No respiratory distress.  Lungs clear to auscultation. No rhonchi, or rales. Faint expiratory wheeze in the right upper lobe. Lungs are globally diminished.   Extremities:  No edema.   Skin:  Warm, dry. No rash or lesions present on visible skin.  Neuro:  Non-focal. No obvious weakness.   Psych:  Affect normal, eye contact good, speech clear and coherent.        I attest that I, Arta Bruce, personally documented this note while acting as scribe for Noralyn Pick, FNP.      Arta Bruce, Scribe.  05/16/2022     The documentation recorded by the scribe accurately reflects the service I personally performed and the decisions made by me.     Noralyn Pick, FNP

## 2022-05-16 ENCOUNTER — Ambulatory Visit: Admit: 2022-05-16 | Discharge: 2022-05-17 | Payer: MEDICARE | Attending: Family | Primary: Family

## 2022-05-16 DIAGNOSIS — F32A Depression, unspecified depression type: Principal | ICD-10-CM

## 2022-05-16 DIAGNOSIS — Z794 Long term (current) use of insulin: Principal | ICD-10-CM

## 2022-05-16 DIAGNOSIS — Z1231 Encounter for screening mammogram for malignant neoplasm of breast: Principal | ICD-10-CM

## 2022-05-16 DIAGNOSIS — Z1211 Encounter for screening for malignant neoplasm of colon: Principal | ICD-10-CM

## 2022-05-16 DIAGNOSIS — D869 Sarcoidosis, unspecified: Principal | ICD-10-CM

## 2022-05-16 DIAGNOSIS — Z13220 Encounter for screening for lipoid disorders: Principal | ICD-10-CM

## 2022-05-16 DIAGNOSIS — E1165 Type 2 diabetes mellitus with hyperglycemia: Principal | ICD-10-CM

## 2022-05-16 DIAGNOSIS — Z1239 Encounter for other screening for malignant neoplasm of breast: Principal | ICD-10-CM

## 2022-05-16 DIAGNOSIS — J449 Chronic obstructive pulmonary disease, unspecified: Principal | ICD-10-CM

## 2022-05-16 LAB — LIPID PANEL
CHOLESTEROL/HDL RATIO SCREEN: 5.8 — ABNORMAL HIGH (ref 1.0–4.5)
CHOLESTEROL: 226 mg/dL — ABNORMAL HIGH (ref <=200)
HDL CHOLESTEROL: 39 mg/dL — ABNORMAL LOW (ref 40–60)
LDL CHOLESTEROL CALCULATED: 146 mg/dL — ABNORMAL HIGH (ref 40–99)
NON-HDL CHOLESTEROL: 187 mg/dL — ABNORMAL HIGH (ref 70–130)
TRIGLYCERIDES: 207 mg/dL — ABNORMAL HIGH (ref 0–150)
VLDL CHOLESTEROL CAL: 41.4 mg/dL — ABNORMAL HIGH (ref 11–40)

## 2022-05-16 LAB — HEMOGLOBIN A1C
ESTIMATED AVERAGE GLUCOSE: 212 mg/dL
HEMOGLOBIN A1C: 9 % — ABNORMAL HIGH (ref 4.8–5.6)

## 2022-05-16 MED ORDER — INSULIN ASPART U-100  100 UNIT/ML SUBCUTANEOUS SOLUTION
1 refills | 0 days | Status: CP
Start: 2022-05-16 — End: 2023-12-08

## 2022-05-16 MED ORDER — AMLODIPINE 5 MG TABLET
ORAL_TABLET | Freq: Every day | ORAL | 3 refills | 90 days | Status: CP
Start: 2022-05-16 — End: 2023-05-16

## 2022-05-16 MED ORDER — ESCITALOPRAM 10 MG TABLET
ORAL_TABLET | Freq: Every day | ORAL | 1 refills | 90 days | Status: CP
Start: 2022-05-16 — End: ?

## 2022-05-16 MED ORDER — IPRATROPIUM BROMIDE 0.02 % SOLUTION FOR INHALATION
Freq: Four times a day (QID) | RESPIRATORY_TRACT | 3 refills | 36 days | Status: CP
Start: 2022-05-16 — End: 2023-05-16

## 2022-05-16 MED ORDER — ALBUTEROL SULFATE HFA 90 MCG/ACTUATION AEROSOL INHALER
RESPIRATORY_TRACT | 3 refills | 0 days | Status: CP | PRN
Start: 2022-05-16 — End: 2023-05-16

## 2022-05-16 MED ORDER — ALBUTEROL SULFATE 2.5 MG/3 ML (0.083 %) SOLUTION FOR NEBULIZATION
Freq: Four times a day (QID) | RESPIRATORY_TRACT | 3 refills | 30 days | Status: CP | PRN
Start: 2022-05-16 — End: 2023-05-16

## 2022-05-16 MED ORDER — GABAPENTIN 300 MG CAPSULE
ORAL_CAPSULE | Freq: Three times a day (TID) | ORAL | 3 refills | 90 days | Status: CP
Start: 2022-05-16 — End: 2023-05-17

## 2022-05-17 MED ORDER — IPRATROPIUM BROMIDE 0.02 % SOLUTION FOR INHALATION
Freq: Four times a day (QID) | RESPIRATORY_TRACT | 3 refills | 36 days
Start: 2022-05-17 — End: 2023-05-17

## 2022-05-18 NOTE — Unmapped (Signed)
PA PA ATROVENT NEB initiated electronically via Cover My Meds

## 2022-05-22 MED ORDER — IPRATROPIUM BROMIDE 0.02 % SOLUTION FOR INHALATION
Freq: Four times a day (QID) | RESPIRATORY_TRACT | 3 refills | 36 days
Start: 2022-05-22 — End: 2023-05-22

## 2022-05-22 NOTE — Unmapped (Signed)
PA ALBUTEROL NEBS initiated electronically via Cover My Meds

## 2022-06-01 DIAGNOSIS — B441 Other pulmonary aspergillosis: Principal | ICD-10-CM

## 2022-06-01 NOTE — Unmapped (Unsigned)
[facility] Clinical Assessment & Refill Coordination Note    Karen Barber, DOB: March 20, 1966  Phone: [phone] (home) [phone] (work)    All above HIPAA information was verified with patient.     Was a Nurse, learning disability used for this call? No    Specialty Medication(s):   Infectious Disease: posaconazole     Current Outpatient Medications   Medication Sig Dispense Refill    ACCU-CHEK GUIDE GLUCOSE METER Misc 1 Device by Other route Four (4) times a day (before meals and nightly). AS DIRECTED 1 kit 0    albuterol 2.5 mg /3 mL (0.083 %) nebulizer solution Inhale 3 mL (2.5 mg total) by nebulization every six (6) hours as needed for wheezing or shortness of breath (airway clearance/cough). 360 mL 3    albuterol HFA 90 mcg/actuation inhaler Inhale 2 puffs every four (4) hours as needed for wheezing or shortness of breath. 54 g 3    amlodipine (NORVASC) 5 MG tablet Take 1 tablet (5 mg total) by mouth daily. Added per patient 90 tablet 3    aspirin 325 MG tablet Take 1 tablet (325 mg total) by mouth daily.      aspirin-acetaminophen-caffeine (EXCEDRIN MIGRAINE) 250-250-65 mg per tablet Take by mouth every hour as needed.      blood sugar diagnostic Strp by Other route Three (3) times a day. ACCU-CHEK Guide meter 300 strip 1    budesonide-formoteroL (SYMBICORT) 160-4.5 mcg/actuation inhaler Inhale 2 puffs Two (2) times a day. 10.2 g 3    cyclobenzaprine (FLEXERIL) 10 MG tablet Take 1 tablet (10 mg total) by mouth Three (3) times a day as needed. 90 tablet 2    escitalopram oxalate (LEXAPRO) 10 MG tablet Take 1 tablet (10 mg total) by mouth daily. Take 1/2 tablet (5 mg) daily for 14 days, then increase to 10 mg (1 tablet) daily. 90 tablet 1    furosemide (LASIX) 40 MG tablet Take 1 tablet (40 mg total) by mouth two (2) times a day.      gabapentin (NEURONTIN) 300 MG capsule Take 1 capsule (300 mg total) by mouth Three (3) times a day. 270 capsule 3    insulin aspart (NOVOLOG U-100 INSULIN ASPART) 100 unit/mL vial Take 1-20 units SQ QID AC and HS as directed 20 mL 1    insulin glargine (BASAGLAR KWIKPEN U-100 INSULIN) 100 unit/mL (3 mL) injection pen Inject 0.24 mL (24 Units total) under the skin nightly. (Patient taking differently: Inject 0.18 mL (18 Units total) under the skin nightly.) 21.6 mL 0    ipratropium (ATROVENT) 0.02 % nebulizer solution Inhale 2.5 mL (500 mcg total) by nebulization four (4) times a day. 360 mL 3    OXYGEN-AIR DELIVERY SYSTEMS MISC Dose: 2-3 LPM      posaconazole (NOXAFIL) 100 mg delayed released tablet Take 2 tablets (200 mg total) by mouth daily. 60 tablet 3    sildenafiL, pulm.hypertension, (REVATIO) 20 mg tablet Take 1 tablet (20 mg total) by mouth Three (3) times a day. 270 tablet 0     No current facility-administered medications for this visit.        Changes to medications: Karen Barber reports no changes at this time.    No Known Allergies    Changes to allergies: No    SPECIALTY MEDICATION ADHERENCE     Posaconazole 100 mg: 2 days of medicine on hand       Medication Adherence    Patient reported X missed doses in  the last month: 1-2  Specialty Medication: posaconazole 100 mg - 2 tabs daily  Patient is on additional specialty medications: No  Patient is on more than two specialty medications: No  Any gaps in refill history greater than 2 weeks in the last 3 months: no  Demonstrates understanding of importance of adherence: yes  Informant: patient          Specialty medication(s) dose(s) confirmed: Regimen is correct and unchanged.     Are there any concerns with adherence? No - Karen Barber states she had extra posaconazole on hand that she wanted to work through before requesting a refill. Of note, posaconazole was last dispensed on 02/02/22 for a 30-day supply    Adherence counseling provided? Not needed    CLINICAL MANAGEMENT AND INTERVENTION      Clinical Benefit Assessment:    Do you feel the medicine is effective or helping your condition? Yes    Clinical Benefit counseling provided? Not needed    Adverse Effects Assessment:    Are you experiencing any side effects? No    Are you experiencing difficulty administering your medicine? 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 For example, brushing your teeth or getting up in the morning. Karen Barber was recently hospitalized for dyspnea and hypoxemia. She states O2 states were ~30% upon admission which improved to ~80% at discharge.    Have you discussed this with your provider? Yes - has follow-up appt scheduled for 4/3    Acute Infection Status:    Acute infections noted within Epic:  No active infections  Patient reported infection: None    Therapy Appropriateness:    Is therapy appropriate and patient progressing towards therapeutic goals? Yes, therapy is appropriate and should be continued    DISEASE/MEDICATION-SPECIFIC INFORMATION      N/A    Other Infectious Disease: Not Applicable    PATIENT SPECIFIC NEEDS     Does the patient have any physical, cognitive, or cultural barriers? No    Is the patient high risk? No    Did the patient require a clinical intervention? No    Does the patient require physician intervention or other additional services (i.e., nutrition, smoking cessation, social work)? No    SOCIAL DETERMINANTS OF HEALTH     At the [facility], we have learned that life circumstances - like trouble affording food, housing, utilities, or transportation can affect the health of many of our patients.   That is why we wanted to ask: are you currently experiencing any life circumstances that are negatively impacting your health and/or quality of life? Patient declined to answer    Social Determinants of Health     Financial Resource Strain: Patient Declined (05/31/2022)    Received from [facility] Overall Financial Resource Strain (CARDIA)     Difficulty of Paying Living Expenses: Patient declined   Internet Connectivity: Not on file   Food Insecurity: Patient Declined (05/31/2022)    Received from [facility]    Hunger Vital Sign     Worried About Running Out of Food in the Last Year: Patient declined     Ran Out of Food in the Last Year: Patient declined   Tobacco  Use: Medium Risk (05/31/2022)    Received from [facility]    Patient History     Smoking Tobacco Use: Former     Smokeless Tobacco Use: Never     Passive Exposure: Not on file   Housing/Utilities: Low Risk  (05/16/2022)    Housing/Utilities     Within the past 12 months, have you ever stayed: outside, in a car, in a tent, in an overnight shelter, or temporarily in someone else's home (i.e. couch-surfing)?: No     Are you worried about losing your housing?: No     Within the past 12 months, have you been unable to get utilities (heat, electricity) when it was really needed?: No   Alcohol Use: Not At Risk (05/16/2022)    Alcohol Use     How often do you have a drink containing alcohol?: Never     How many drinks containing alcohol do you have on a typical day when you are drinking?: Not on file     How often do you have 5 or more drinks on one occasion?: Never   Transportation Needs: No Transportation Needs (06/01/2022)    Received from [facility]    PRAPARE - Transportation     Lack of Transportation (Medical): No     Lack of Transportation (Non-Medical): No   Substance Use: Low Risk  (05/16/2022)    Substance Use     Taken prescription drugs for non-medical reasons: Never     Taken illegal drugs: Never     Patient indicated they have taken drugs in the past year for non-medical reasons: Yes, [positive answer(s)]: Not on file   Health Literacy: Low Risk  (08/07/2019)    Health Literacy     : Never   Physical Activity: Not on file   Interpersonal Safety: Not on file   Stress: Not on file   Intimate Partner Violence: Not on file   Depression: Not at risk (11/02/2020)    PHQ-2     PHQ-2 Score: 0   Social Connections: Not on file       Would you be willing to receive help with any of the needs that you have identified today? Not applicable       SHIPPING     Specialty Medication(s) to be Shipped:   Infectious Disease: posaconazole    Other medication(s) to be shipped: No additional medications requested for fill at this time     Changes to insurance: No    Patient was informed of new phone menu: No    Delivery Scheduled: Yes, Expected medication delivery date: 06/06/22.     Medication will be delivered via Same Day Courier to the confirmed prescription address in [facility].    The patient will receive a drug information handout for each medication shipped and additional FDA Medication Guides as required.  Verified that patient has previously received a Conservation officer, historic buildings and a Surveyor, mining.    The patient or caregiver noted above participated in the development of this care plan and knows that they can request review of or adjustments to the care plan at any time.      All of the patient's questions and concerns have been addressed.    Oliva Bustard, PharmD   [facility] Specialty Pharmacist clinic,***} to the confirmed {Blank:[id number]::prescription,temporary} address in [facility].    The patient will receive a drug information handout for each medication shipped and additional  FDA Medication Guides as required.  Verified that patient has previously received a Conservation officer, historic buildings and a Surveyor, mining.    The patient or caregiver noted above participated in the development of this care plan and knows that they can request review of or adjustments to the care plan at any time.      All of the patient's questions and concerns have been addressed.    Oliva Bustard, PharmD   [facility] Specialty Pharmacist

## 2022-06-05 NOTE — Unmapped (Signed)
[facility] Specialty Medication Onboarding    Specialty Medication: posaconazole 100 mg delayed released tablet (NOXAFIL)  Prior Authorization: Approved   Financial Assistance: No - copay  <$25  Final Copay/Day Supply: $4 / 30    Insurance Restrictions: None     Notes to Pharmacist:     The triage team has completed the benefits investigation and has determined that the patient is able to fill this medication at [facility]. Please contact the patient to complete the onboarding or follow up with the prescribing physician as needed.

## 2022-06-06 MED FILL — POSACONAZOLE 100 MG TABLET,DELAYED RELEASE: ORAL | 30 days supply | Qty: 60 | Fill #1

## 2022-06-08 NOTE — Unmapped (Signed)
 Assessment and Plan:     Pulmonary sarcoidosis (CMS-HCC)  Patient presents today for a face to face for home health aide. All paperwork was reviewed and filled out accordingly. Counseled patient on duties of home health. Recommended patient remain as active as possible within safe limits. Counseled patient on stiffness and pain from long periods of inactivity. If symptoms worsen or persist contact this provider.    Dyspnea on exertion    Oxygen dependent    Weakness generalized    Back pain, unspecified back location, unspecified back pain laterality, unspecified chronicity       *copy of home health forms scanned into chart.     I personally spent 25 minutes face-to-face and non-face-to-face in the care of this patient, which includes all pre, intra, and post visit time on the date of service.    Return for has return appointment for 08/17/22.    HPI:      Karen Barber is here for   Chief Complaint   Patient presents with    Face to face for home health aide     Face to face for home health aide [facility].     Patient is presenting today for a face to face for home health aide through [facility]. Patient is having SOB with exertion all the time, she recently visited the ED because her O2 dropped to 54% when exerting herself. She is complaining of back (x 1 year)and knee pain all of the time, she is unable to ambulate in the way she wishes to. She is also having shoulder pain and limited energy and weakness (worsening). She states she is trying to do yoga. She uses a walker at home. She needs help cleaning, cooking, bathing, and transportation to Dr's appointments.    She has been using home oxygen since 1999.      ROS:      Comprehensive 10 point ROS negative unless otherwise stated in the HPI.      [facility] Components:     Medication adherence and barriers to the treatment plan have been addressed. Opportunities to optimize healthy behaviors have been discussed. Patient / caregiver voiced understanding.    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 A.niger from sputum 08/2016-09/2016.    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    Sarcoidosis 1995    Type 2 diabetes mellitus with hyperglycemia (CMS-HCC) 07/27/2014    Visual impairment     glasses     Past Surgical History:   Procedure Laterality Date    LUNG LOBECTOMY      PR AMPUTATION TOE,MT-P JT Right 12/12/2019    Procedure: Right foot fifth toe amputation at the metatarsophalangeal joint level;  Surgeon: Unice Bailey  Delia Chimes, DPM;  Location: MAIN OR [facility];  Service: Vascular    PR AMPUTATION TOE,MT-P JT Right 06/16/2020    Procedure: Right foot amputation of 3rd toe at mpj level;  Surgeon: Britt Bottom, DPM;  Location: MAIN OR [facility];  Service: Vascular    PR RIGHT HEART CATH O2 SATURATION & CARDIAC OUTPUT N/A 04/29/2019    Procedure: Right Heart Catheterization;  Surgeon: Rosana Hoes, MD;  Location: [facility] CATH;  Service: Cardiology    PR THORACOSCOPY SURG TOT PULM DECORT Left 12/14/2014    Procedure: THORACOSCOPY SURG; W/TOT PULM DECORTIC/PNEUMOLYS;  Surgeon: Evert Kohl, MD;  Location: MAIN OR [facility];  Service: Cardiothoracic    PR THORACOSCOPY W/THERA WEDGE RESEXN INITIAL UNILAT Left 12/03/2014    Procedure: THORACOSCOPY, SURGICAL; WITH THERAPEUTIC WEDGE RESECTION (EG, MASS, NODULE) INITIAL UNILATERAL;  Surgeon: Evert Kohl, MD;  Location: MAIN OR [facility];  Service: Cardiothoracic       Family History:     Family History   Problem Relation Age of Onset    Dementia Mother     Diabetes Father     Diabetes Brother     Diabetes Maternal Aunt     Sarcoidosis Maternal Aunt     Diabetes Maternal Uncle     Diabetes Paternal Aunt     Diabetes Paternal Uncle        Social History:     Social History     Tobacco Use    Smoking status: Former     Current packs/day: 0.00     Average packs/day: 0.5 packs/day for 9.0 years (4.5 ttl pk-yrs)     Types: Cigarettes     Start date: 05/02/1978     Quit date: 05/03/1987     Years since quitting: 35.1    Smokeless tobacco: Never   Vaping Use    Vaping status: Never Used   Substance Use Topics    Alcohol use: No     Alcohol/week: 0.0 standard drinks of alcohol    Drug use: No       Allergies:     Patient has no known allergies.    Current Medications:     Current Outpatient Medications   Medication Sig Dispense Refill    ACCU-CHEK FASTCLIX LANCET DRUM Misc 1 each by Other route Four (4) times a day (before meals and nightly). 400 each 1    ACCU-CHEK GUIDE GLUCOSE METER Misc 1 Device by Other route Four (4) times a day (before meals and nightly). AS DIRECTED 1 kit 0    albuterol 2.5 mg /3 mL (0.083 %) nebulizer solution Inhale 3 mL (2.5 mg total) by nebulization every six (6) hours as needed for wheezing or shortness of breath (airway clearance/cough). 360 mL 3    albuterol HFA 90 mcg/actuation inhaler Inhale 2 puffs every four (4) hours as needed for wheezing or shortness of breath. 54 g 3    amlodipine (NORVASC) 5 MG tablet Take 1 tablet (5 mg total) by mouth daily. Added per patient 90 tablet 3    aspirin-acetaminophen-caffeine (EXCEDRIN MIGRAINE) 250-250-65 mg per tablet Take by mouth every hour as needed.      blood sugar diagnostic Strp by Other route Three (3) times a day. ACCU-CHEK Guide meter 300 strip 1    budesonide-formoteroL (SYMBICORT) 160-4.5 mcg/actuation inhaler Inhale 2 puffs Two (2) times a day. 10.2 g 3    cholecalciferol, vitamin D3-1,250 mcg, 50,000 unit,, 1,250 mcg (50,000 unit) capsule Take 1 capsule (1,250 mcg total)  by mouth once a week.      cyclobenzaprine (FLEXERIL) 10 MG tablet Take 1 tablet (10 mg total) by mouth Three (3) times a day as needed. 90 tablet 2    escitalopram oxalate (LEXAPRO) 10 MG tablet Take 1 tablet (10 mg total) by mouth daily. Take 1/2 tablet (5 mg) daily for 14 days, then increase to 10 mg (1 tablet) daily. 90 tablet 1    furosemide (LASIX) 40 MG tablet Take 1 tablet (40 mg total) by mouth two (2) times a day.      gabapentin (NEURONTIN) 300 MG capsule Take 1 capsule (300 mg total) by mouth Three (3) times a day. 270 capsule 3    insulin aspart (NOVOLOG U-100 INSULIN ASPART) 100 unit/mL vial Take 1-20 units SQ QID AC and HS as directed 20 mL 1    insulin glargine (BASAGLAR KWIKPEN U-100 INSULIN) 100 unit/mL (3 mL) injection pen Inject 0.24 mL (24 Units total) under the skin nightly. (Patient taking differently: Inject 0.18 mL (18 Units total) under the skin nightly.) 21.6 mL 0    OXYGEN-AIR DELIVERY SYSTEMS MISC Dose: 2-3 LPM      posaconazole (NOXAFIL) 100 mg delayed released tablet Take 2 tablets (200 mg total) by mouth daily. 60 tablet 3    sildenafiL, pulm.hypertension, (REVATIO) 20 mg tablet Take 1 tablet (20 mg total) by mouth Three (3) times a day. 270 tablet 0    aspirin 325 MG tablet Take 1 tablet (325 mg total) by mouth daily.      ipratropium (ATROVENT) 0.02 % nebulizer solution Inhale 2.5 mL (500 mcg total) by nebulization four (4) times a day. 360 mL 3    sodium chloride 3 % NEBULIZER solution Inhale 4 mL by nebulization every six (6) hours as needed for cough. 750 mL 5     No current facility-administered medications for this visit.       Health Maintenance:     Health Maintenance   Topic Date Due    HPV Cotest with Pap Smear (21-65)  Never done    Pap Smear with Cotest HPV (21-65)  02/27/2001    Zoster Vaccines (1 of 2) Never done DTaP/Tdap/Td Vaccines (2 - Td or Tdap) 07/07/2017    Colon Cancer Screening  07/18/2017    Mammogram Start Age 44  03/01/2019    Retinal Eye Exam  11/15/2019    COVID-19 Vaccine (5 - 2023-24 season) 11/18/2021    COPD Spirometry  01/04/2022    Hemoglobin A1c  08/14/2022    Urine Albumin/Creatinine Ratio  01/02/2023    Foot Exam  05/17/2023    Serum Creatinine Monitoring  06/02/2023    Potassium Monitoring  06/02/2023    Lipid Screening  05/17/2027    Pneumococcal Vaccine 0-64  Completed    Hepatitis C Screen  Completed    Influenza Vaccine  Completed       Immunizations:     Immunization History   Administered Date(s) Administered    COVID-19 VAC,BIVALENT(9YR UP),PFIZER 06/29/2021    COVID-19 VACC,MRNA,(PFIZER)(PF) 06/11/2019, 07/02/2019, 11/21/2019    INFLUENZA INJ MDCK PF, QUAD,(FLUCELVAX)(20MO AND UP EGG FREE) 01/19/2021    INFLUENZA TIV (TRI) PF (IM) 01/02/2005, 12/14/2008    Influenza Vaccine Quad(IM)6 MO-Adult(PF) 11/28/2012, 12/19/2014, 12/16/2015, 01/03/2017, 12/06/2017, 01/08/2019, 12/15/2019, 12/21/2021    PNEUMOCOCCAL POLYSACCHARIDE 23-VALENT 07/08/2007, 01/03/2017    PPD Test 01/19/2015    Pneumococcal Conjugate 20-valent 05/16/2022    TdaP 07/08/2007     I have reviewed and (if needed) updated the  patient's problem list, medications, allergies, past medical and surgical history, social and family history.     Vital Signs:     Wt Readings from Last 3 Encounters:   06/12/22 89.8 kg (198 lb)   05/16/22 89.4 kg (197 lb)   06/29/21 94.8 kg (208 lb 15.9 oz)     Temp Readings from Last 3 Encounters:   06/12/22 36.9 ??C (98.4 ??F) (Oral)   05/16/22 36.9 ??C (98.4 ??F) (Oral)   11/19/21 37 ??C (98.6 ??F) (Oral)     BP Readings from Last 3 Encounters:   06/12/22 118/76   05/16/22 112/76   11/19/21 130/81     Pulse Readings from Last 3 Encounters:   06/12/22 88   05/16/22 90   11/19/21 82     Estimated body mass index is 27.62 kg/m?? as calculated from the following:    Height as of this encounter: 180.3 cm (5' 11). Weight as of this encounter: 89.8 kg (198 lb).  Facility age limit for growth %iles is 20 years.      Objective:      General: Alert and oriented x3. Well-appearing. No acute distress. Oxygen in use. Sitting in wheelchair.  HEENT:  Normocephalic.  Atraumatic. Conjunctiva and sclera normal.  Neck:  Supple.   Skin:  Warm, dry. No rash or lesions present on visible skin.  Neuro:  Non-focal. No obvious weakness.   Psych:  Affect normal, eye contact good, speech clear and coherent.        I attest that I, Arta Bruce, personally documented this note while acting as scribe for Noralyn Pick, FNP.      Arta Bruce, Scribe.  06/12/2022     The documentation recorded by the scribe accurately reflects the service I personally performed and the decisions made by me.     Noralyn Pick, FNP

## 2022-06-12 ENCOUNTER — Ambulatory Visit: Admit: 2022-06-12 | Discharge: 2022-06-13 | Payer: MEDICARE | Attending: Family | Primary: Family

## 2022-06-12 DIAGNOSIS — D860 Sarcoidosis of lung: Principal | ICD-10-CM

## 2022-06-12 DIAGNOSIS — Z9981 Dependence on supplemental oxygen: Principal | ICD-10-CM

## 2022-06-12 DIAGNOSIS — R0609 Other forms of dyspnea: Principal | ICD-10-CM

## 2022-06-12 DIAGNOSIS — M549 Dorsalgia, unspecified: Principal | ICD-10-CM

## 2022-06-12 DIAGNOSIS — R531 Weakness: Principal | ICD-10-CM

## 2022-06-12 MED ORDER — IPRATROPIUM BROMIDE 0.02 % SOLUTION FOR INHALATION
Freq: Four times a day (QID) | RESPIRATORY_TRACT | 3 refills | 36 days | Status: CP
Start: 2022-06-12 — End: 2023-06-12

## 2022-06-12 MED ORDER — SODIUM CHLORIDE 3 % FOR NEBULIZATION
Freq: Four times a day (QID) | RESPIRATORY_TRACT | 5 refills | 47 days | Status: CP | PRN
Start: 2022-06-12 — End: 2023-06-12

## 2022-06-22 NOTE — Unmapped (Signed)
Karen Barber [facility]/ [facility] called - FYI : start of care was 3/25 - they have not been able to see the patient since then. Patient cancels every time. Patient called agency today & stated she does not need HH, [facility] discharging her.

## 2022-06-22 NOTE — Unmapped (Signed)
Message from [facility] noted.   Karen Barber

## 2022-06-28 NOTE — Unmapped (Signed)
[facility] Refill Coordination Note    Specialty Medication(s) to be Shipped:   Infectious Disease: posaconazole    Other medication(s) to be shipped: No additional medications requested for fill at this time     Karen Barber, DOB: [birth year]  Phone: [phone] (home) [phone] (work)      All above HIPAA information was verified with patient.     Was a Nurse, learning disability used for this call? No    Completed refill call assessment today to schedule patient's medication shipment from the [facility] [phone]).  All relevant notes have been reviewed.     Specialty medication(s) and dose(s) confirmed: Regimen is correct and unchanged.   Changes to medications: Karen Barber reports no changes at this time.  Changes to insurance: No  New side effects reported not previously addressed with a pharmacist or physician: None reported  Questions for the pharmacist: No    Confirmed patient received a Conservation officer, historic buildings and a Surveyor, mining with first shipment. The patient will receive a drug information handout for each medication shipped and additional FDA Medication Guides as required.       DISEASE/MEDICATION-SPECIFIC INFORMATION        N/A    SPECIALTY MEDICATION ADHERENCE     Medication Adherence    Patient reported X missed doses in the last month: 0  Specialty Medication: posaconazole 100mg   Patient is on additional specialty medications: No  Patient is on more than two specialty medications: No  Any gaps in refill history greater than 2 weeks in the last 3 months: no  Demonstrates understanding of importance of adherence: yes  Informant: patient          Were doses missed due to medication being on hold? No    Posaconazole 100 mg: 5-7 days of medicine on hand       REFERRAL TO PHARMACIST     Referral to the pharmacist: Not needed      [facility]     Shipping address confirmed in Epic.       Delivery Scheduled: Yes, Expected medication delivery date: 07/03/22.     Medication will be delivered via Same Day Courier to the prescription address in Epic WAM.    Karen Barber, PharmD   [facility] Specialty Pharmacist

## 2022-07-03 MED FILL — POSACONAZOLE 100 MG TABLET,DELAYED RELEASE: ORAL | 30 days supply | Qty: 60 | Fill #2

## 2022-07-06 DIAGNOSIS — E1165 Type 2 diabetes mellitus with hyperglycemia: Principal | ICD-10-CM

## 2022-07-06 DIAGNOSIS — Z794 Long term (current) use of insulin: Principal | ICD-10-CM

## 2022-07-06 MED ORDER — BLOOD SUGAR DIAGNOSTIC STRIPS
ORAL_STRIP | Freq: Three times a day (TID) | 1 refills | 0 days
Start: 2022-07-06 — End: 2023-07-07

## 2022-07-07 MED ORDER — BLOOD SUGAR DIAGNOSTIC STRIPS
ORAL_STRIP | Freq: Three times a day (TID) | 1 refills | 0 days | Status: CP
Start: 2022-07-07 — End: 2023-07-08

## 2022-07-11 NOTE — Unmapped (Signed)
Just FYI.

## 2022-07-11 NOTE — Unmapped (Signed)
Karen Barber calling from [facility] phone number of [phone] on behalf of Karen Barber stated patient was discharged due to non compliance and canceling scheduled appts and never being at home when they did go to see patient   Karen Barber is requesting help from the provider to assist them with getting their equipment back from the patient as it is expensive equipment   Karen Barber is refusing to give them back their monitoring equipment stating she needs home health   A courier was sent to pick up the equipment and patient refused to give it to him   She will not answer their phone calls   See TE message entered on 06/22/22  If someone from the clinic can please contact Karen Barber to discuss

## 2022-07-12 NOTE — Unmapped (Signed)
Notified Teri with [employer] at [phone] to contact Jackie's Pulmonologist for oxygen therapy order.

## 2022-07-12 NOTE — Unmapped (Signed)
Jackie's phone and brother's phone have been disconnected, changed, or no longer in service.  Sent MyChart message.

## 2022-07-20 NOTE — Unmapped (Signed)
Karen Barber states everything has been cleared up with [facility] and she doesn't need the equipment and it will be returned.  She is continuing care with [facility].

## 2022-08-03 NOTE — Unmapped (Unsigned)
Assessment and Plan:     There are no diagnoses linked to this encounter.       I personally spent *** minutes face-to-face and non-face-to-face in the care of this patient, which includes all pre, intra, and post visit time on the date of service.    No follow-ups on file.    HPI:      Karen Barber is here for No chief complaint on file.    Depression: Patient bring up concerns of depression, which began after her mother passed away in 2023-01-22. She states that she prefers to sleep all of the time to avoid stressful situations.  She takes sleeping pills to try and help herself sleep more. Patient request a medication for mood stabilization.      Diabetes: Patient is managing with Basaglar 18 units nightly and Novolog sliding scale insulin. Patient checks her insulin fasting as well as prandially. Her blood sugars range between 120-140. Patient has had two toes amputated. Denies chest pain, SOB, edema, increased thirst/hunger/urination. Endorses vision changes for reading.      Hypertension: Patient presents for follow-up of hypertension. Blood pressure goal < 140/90.  Hypertension has customarily been at goal complicated by DM. Home blood pressure readings: did not bring log. Medication compliance: taking sildenafil 20 mg TID and amlodipine 5 mg daily as prescribed. She is not doing regular exercise.        ROS:      Comprehensive 10 point ROS negative unless otherwise stated in the HPI.      [facility] Components:     Medication adherence and barriers to the treatment plan have been addressed. Opportunities to optimize healthy behaviors have been discussed. Patient / caregiver voiced understanding.    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 A.niger from sputum 08/2016-09/2016.    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    Sarcoidosis 1995    Type 2 diabetes mellitus with hyperglycemia (CMS-HCC) 07/27/2014    Visual impairment     glasses     Past Surgical History:   Procedure Laterality Date    LUNG LOBECTOMY      PR AMPUTATION TOE,MT-P JT Right 12/12/2019    Procedure: Right foot fifth toe amputation at the metatarsophalangeal joint level;  Surgeon: Britt Bottom, DPM;  Location: MAIN OR [facility];  Service: Vascular    PR AMPUTATION TOE,MT-P JT Right 06/16/2020    Procedure: Right foot amputation of 3rd toe at mpj level;  Surgeon: Britt Bottom, DPM;  Location: MAIN OR [facility];  Service: Vascular    PR RIGHT HEART CATH O2 SATURATION & CARDIAC OUTPUT N/A 04/29/2019    Procedure: Right Heart Catheterization;  Surgeon: Rosana Hoes, MD;  Location:  [facility] CATH;  Service: Cardiology    PR THORACOSCOPY SURG TOT PULM DECORT Left 12/14/2014    Procedure: THORACOSCOPY SURG; W/TOT PULM DECORTIC/PNEUMOLYS;  Surgeon: Evert Kohl, MD;  Location: MAIN OR [facility];  Service: Cardiothoracic    PR THORACOSCOPY W/THERA WEDGE RESEXN INITIAL UNILAT Left 12/03/2014    Procedure: THORACOSCOPY, SURGICAL; WITH THERAPEUTIC WEDGE RESECTION (EG, MASS, NODULE) INITIAL UNILATERAL;  Surgeon: Evert Kohl, MD;  Location: MAIN OR [facility];  Service: Cardiothoracic       Family History:     Family History   Problem Relation Age of Onset    Dementia Mother     Diabetes Father     Diabetes Brother     Diabetes Maternal Aunt     Sarcoidosis Maternal Aunt     Diabetes Maternal Uncle     Diabetes Paternal Aunt     Diabetes Paternal Uncle        Social History:     Social History     Tobacco Use    Smoking status: Former     Current packs/day: 0.00     Average packs/day: 0.5 packs/day for 9.0 years (4.5 ttl pk-yrs)     Types: Cigarettes     Start date: 05/02/1978     Quit date: 05/03/1987     Years since quitting: 35.2    Smokeless tobacco: Never   Vaping Use    Vaping status: Never Used   Substance Use Topics    Alcohol use: No     Alcohol/week: 0.0 standard drinks of alcohol    Drug use: No       Allergies:     Patient has no known allergies.    Current Medications:     Current Outpatient Medications   Medication Sig Dispense Refill    ACCU-CHEK GUIDE GLUCOSE METER Misc 1 Device by Other route Four (4) times a day (before meals and nightly). AS DIRECTED 1 kit 0    albuterol 2.5 mg /3 mL (0.083 %) nebulizer solution Inhale 3 mL (2.5 mg total) by nebulization every six (6) hours as needed for wheezing or shortness of breath (airway clearance/cough). 360 mL 3    albuterol HFA 90 mcg/actuation inhaler Inhale 2 puffs every four (4) hours as needed for wheezing or shortness of breath. 54 g 3    amlodipine (NORVASC) 5 MG tablet Take 1 tablet (5 mg total) by mouth daily. Added per patient 90 tablet 3    aspirin-acetaminophen-caffeine (EXCEDRIN MIGRAINE) 250-250-65 mg per tablet Take by mouth every hour as needed.      blood sugar diagnostic Strp by Other route Three (3) times a day. ACCU-CHEK Guide meter 300 strip 1    budesonide-formoteroL (SYMBICORT) 160-4.5 mcg/actuation inhaler Inhale 2 puffs Two (2) times a day. 10.2 g 3    cholecalciferol, vitamin D3-1,250 mcg, 50,000 unit,, 1,250 mcg (50,000 unit) capsule Take 1 capsule (1,250 mcg total) by mouth once a week.      cyclobenzaprine (FLEXERIL) 10 MG tablet Take 1 tablet (10 mg total) by mouth Three (3) times a day as needed. 90 tablet 2    escitalopram oxalate (LEXAPRO) 10 MG tablet Take 1 tablet (10 mg total) by mouth daily. Take 1/2 tablet (5 mg) daily for 14 days, then increase to 10 mg (1 tablet) daily. 90 tablet 1    furosemide (LASIX) 40 MG tablet Take 1 tablet (40 mg total) by mouth two (2) times a day.      gabapentin (NEURONTIN) 300 MG  capsule Take 1 capsule (300 mg total) by mouth Three (3) times a day. 270 capsule 3    insulin aspart (NOVOLOG U-100 INSULIN ASPART) 100 unit/mL vial Take 1-20 units SQ QID AC and HS as directed 20 mL 1    insulin glargine (BASAGLAR KWIKPEN U-100 INSULIN) 100 unit/mL (3 mL) injection pen Inject 0.24 mL (24 Units total) under the skin nightly. (Patient taking differently: Inject 0.18 mL (18 Units total) under the skin nightly.) 21.6 mL 0    ipratropium (ATROVENT) 0.02 % nebulizer solution Inhale 2.5 mL (500 mcg total) by nebulization four (4) times a day. 360 mL 3    OXYGEN-AIR DELIVERY SYSTEMS MISC Dose: 2-3 LPM      posaconazole (NOXAFIL) 100 mg delayed released tablet Take 2 tablets (200 mg total) by mouth daily. 60 tablet 3    sildenafiL, pulm.hypertension, (REVATIO) 20 mg tablet Take 1 tablet (20 mg total) by mouth Three (3) times a day. 270 tablet 0    sodium chloride 3 % NEBULIZER solution Inhale 4 mL by nebulization every six (6) hours as needed for cough. 750 mL 5     No current facility-administered medications for this visit.       Health Maintenance:     Health Maintenance   Topic Date Due    HPV Cotest with Pap Smear (21-65)  Never done    Pap Smear with Cotest HPV (21-65)  02/27/2001    Zoster Vaccines (1 of 2) Never done    DTaP/Tdap/Td Vaccines (2 - Td or Tdap) 07/07/2017    Colon Cancer Screening  07/18/2017    Mammogram Start Age 65  03/01/2019    Retinal Eye Exam  11/15/2019    COVID-19 Vaccine (5 - 2023-24 season) 11/18/2021    COPD Spirometry  01/04/2022    Hemoglobin A1c  08/14/2022    Urine Albumin/Creatinine Ratio  01/02/2023    Foot Exam  05/17/2023    Serum Creatinine Monitoring  06/02/2023    Potassium Monitoring  06/02/2023    Lipid Screening  05/17/2027    Pneumococcal Vaccine 0-64  Completed    Hepatitis C Screen  Completed Influenza Vaccine  Completed       Immunizations:     Immunization History   Administered Date(s) Administered    COVID-19 VAC,BIVALENT(40YR UP),PFIZER 06/29/2021    COVID-19 VACC,MRNA,(PFIZER)(PF) 06/11/2019, 07/02/2019, 11/21/2019    INFLUENZA INJ MDCK PF, QUAD,(FLUCELVAX)(83MO AND UP EGG FREE) 01/19/2021    INFLUENZA TIV (TRI) PF (IM) 01/02/2005, 12/14/2008    Influenza Vaccine Quad(IM)6 MO-Adult(PF) 11/28/2012, 12/19/2014, 12/16/2015, 01/03/2017, 12/06/2017, 01/08/2019, 12/15/2019, 12/21/2021    PNEUMOCOCCAL POLYSACCHARIDE 23-VALENT 07/08/2007, 01/03/2017    PPD Test 01/19/2015    Pneumococcal Conjugate 20-valent 05/16/2022    TdaP 07/08/2007     I have reviewed and (if needed) updated the patient's problem list, medications, allergies, past medical and surgical history, social and family history.     Vital Signs:     Wt Readings from Last 3 Encounters:   06/12/22 89.8 kg (198 lb)   05/16/22 89.4 kg (197 lb)   06/29/21 94.8 kg (208 lb 15.9 oz)     Temp Readings from Last 3 Encounters:   06/12/22 36.9 ??C (98.4 ??F) (Oral)   05/16/22 36.9 ??C (98.4 ??F) (Oral)   11/19/21 37 ??C (98.6 ??F) (Oral)     BP Readings from Last 3 Encounters:   06/12/22 118/76   05/16/22 112/76   11/19/21 130/81     Pulse Readings from Last 3 Encounters:  06/12/22 88   05/16/22 90   11/19/21 82     Estimated body mass index is 27.62 kg/m?? as calculated from the following:    Height as of 06/12/22: 180.3 cm (5' 11).    Weight as of 06/12/22: 89.8 kg (198 lb).  No height and weight on file for this encounter.      Objective:      General: Alert and oriented x3. Well-appearing. No acute distress. ***  HEENT:  Normocephalic.  Atraumatic. Conjunctiva and sclera normal. OP MMM without lesions. ***  Neck:  Supple. No thyroid enlargement. No adenopathy. ***  Heart:  Regular rate and rhythm. Normal S1, S2. No murmurs, rubs or gallops. ***  Lungs:  No respiratory distress.  Lungs clear to auscultation. No wheezes, rhonchi, or rales. ***  GI/GU:  Soft, +BS, nondistended, non-TTP. No palpable masses or organomegaly. ***  Extremities:  No edema. Peripheral pulses normal. ***  Skin:  Warm, dry. No rash or lesions present. ***  Neuro:  Non-focal. No obvious weakness. ***  Psych:  Affect normal, eye contact good, speech clear and coherent. ***       I attest that I, Arta Bruce, personally documented this note while acting as scribe for Noralyn Pick, FNP.      Arta Bruce, Scribe.  08/17/2022     The documentation recorded by the scribe accurately reflects the service I personally performed and the decisions made by me.     Noralyn Pick, FNP

## 2022-08-10 NOTE — Unmapped (Signed)
The [facility] has made a third and final attempt to reach this patient to refill the following medication:posaconazole 100 mg delayed released tablet (NOXAFIL).      We have left voicemails on the following phone numbers: [phone], have sent a text message to the following phone numbers: [phone], and have sent a Mychart questionnaire..    Dates contacted: 05/05    05/13  05/23  Last scheduled delivery: 07/03/22    The patient may be at risk of non-compliance with this medication. The patient should call the [facility] at [phone]  Option 4, then Option 3: Allergy, Immunology, Pulmonary, Neurology to refill medication.    Estha Few' W Rexann Lueras   [facility] Specialty Technician

## 2022-08-24 NOTE — Unmapped (Unsigned)
Assessment and Plan:     There are no diagnoses linked to this encounter.       I personally spent *** minutes face-to-face and non-face-to-face in the care of this patient, which includes all pre, intra, and post visit time on the date of service.    No follow-ups on file.    HPI:      Karen Barber is here for No chief complaint on file.    Depression: Patient bring up concerns of depression, which began after her mother passed away in Jan 29, 2023. She states that she prefers to sleep all of the time to avoid stressful situations.  She takes sleeping pills to try and help herself sleep more. Patient request a medication for mood stabilization.      Diabetes: Patient is managing with Basaglar 18 units nightly and Novolog sliding scale insulin. Patient checks her insulin fasting as well as prandially. Her blood sugars range between 120-140. Patient has had two toes amputated. Denies chest pain, SOB, edema, increased thirst/hunger/urination. Endorses vision changes for reading.      Hypertension: Patient presents for follow-up of hypertension. Blood pressure goal < 140/90.  Hypertension has customarily been at goal complicated by DM. Home blood pressure readings: did not bring log. Medication compliance: taking sildenafil 20 mg TID and amlodipine 5 mg daily as prescribed. She is not doing regular exercise.        ROS:      Comprehensive 10 point ROS negative unless otherwise stated in the HPI.      [facility] Components:     Medication adherence and barriers to the treatment plan have been addressed. Opportunities to optimize healthy behaviors have been discussed. Patient / caregiver voiced understanding.    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 A.niger from sputum 08/2016-09/2016.    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    Sarcoidosis 1995    Type 2 diabetes mellitus with hyperglycemia (CMS-HCC) 07/27/2014    Visual impairment     glasses     Past Surgical History:   Procedure Laterality Date    LUNG LOBECTOMY      PR AMPUTATION TOE,MT-P JT Right 12/12/2019    Procedure: Right foot fifth toe amputation at the metatarsophalangeal joint level;  Surgeon: Britt Bottom, DPM;  Location: MAIN OR [facility];  Service: Vascular    PR AMPUTATION TOE,MT-P JT Right 06/16/2020    Procedure: Right foot amputation of 3rd toe at mpj level;  Surgeon: Britt Bottom, DPM;  Location: MAIN OR [facility];  Service: Vascular    PR RIGHT HEART CATH O2 SATURATION & CARDIAC OUTPUT N/A 04/29/2019    Procedure: Right Heart Catheterization;  Surgeon: Rosana Hoes, MD;  Location:  [facility] CATH;  Service: Cardiology    PR THORACOSCOPY SURG TOT PULM DECORT Left 12/14/2014    Procedure: THORACOSCOPY SURG; W/TOT PULM DECORTIC/PNEUMOLYS;  Surgeon: Evert Kohl, MD;  Location: MAIN OR [facility];  Service: Cardiothoracic    PR THORACOSCOPY W/THERA WEDGE RESEXN INITIAL UNILAT Left 12/03/2014    Procedure: THORACOSCOPY, SURGICAL; WITH THERAPEUTIC WEDGE RESECTION (EG, MASS, NODULE) INITIAL UNILATERAL;  Surgeon: Evert Kohl, MD;  Location: MAIN OR [facility];  Service: Cardiothoracic       Family History:     Family History   Problem Relation Age of Onset    Dementia Mother     Diabetes Father     Diabetes Brother     Diabetes Maternal Aunt     Sarcoidosis Maternal Aunt     Diabetes Maternal Uncle     Diabetes Paternal Aunt     Diabetes Paternal Uncle        Social History:     Social History     Tobacco Use    Smoking status: Former     Current packs/day: 0.00     Average packs/day: 0.5 packs/day for 9.0 years (4.5 ttl pk-yrs)     Types: Cigarettes     Start date: 05/02/1978     Quit date: 05/03/1987     Years since quitting: 35.3    Smokeless tobacco: Never   Vaping Use    Vaping status: Never Used   Substance Use Topics    Alcohol use: No     Alcohol/week: 0.0 standard drinks of alcohol    Drug use: No       Allergies:     Patient has no known allergies.    Current Medications:     Current Outpatient Medications   Medication Sig Dispense Refill    ACCU-CHEK GUIDE GLUCOSE METER Misc 1 Device by Other route Four (4) times a day (before meals and nightly). AS DIRECTED 1 kit 0    albuterol 2.5 mg /3 mL (0.083 %) nebulizer solution Inhale 3 mL (2.5 mg total) by nebulization every six (6) hours as needed for wheezing or shortness of breath (airway clearance/cough). 360 mL 3    albuterol HFA 90 mcg/actuation inhaler Inhale 2 puffs every four (4) hours as needed for wheezing or shortness of breath. 54 g 3    amlodipine (NORVASC) 5 MG tablet Take 1 tablet (5 mg total) by mouth daily. Added per patient 90 tablet 3    aspirin-acetaminophen-caffeine (EXCEDRIN MIGRAINE) 250-250-65 mg per tablet Take by mouth every hour as needed.      blood sugar diagnostic Strp by Other route Three (3) times a day. ACCU-CHEK Guide meter 300 strip 1    budesonide-formoteroL (SYMBICORT) 160-4.5 mcg/actuation inhaler Inhale 2 puffs Two (2) times a day. 10.2 g 3    cholecalciferol, vitamin D3-1,250 mcg, 50,000 unit,, 1,250 mcg (50,000 unit) capsule Take 1 capsule (1,250 mcg total) by mouth once a week.      cyclobenzaprine (FLEXERIL) 10 MG tablet Take 1 tablet (10 mg total) by mouth Three (3) times a day as needed. 90 tablet 2    escitalopram oxalate (LEXAPRO) 10 MG tablet Take 1 tablet (10 mg total) by mouth daily. Take 1/2 tablet (5 mg) daily for 14 days, then increase to 10 mg (1 tablet) daily. 90 tablet 1    furosemide (LASIX) 40 MG tablet Take 1 tablet (40 mg total) by mouth two (2) times a day.      gabapentin (NEURONTIN) 300 MG  capsule Take 1 capsule (300 mg total) by mouth Three (3) times a day. 270 capsule 3    insulin aspart (NOVOLOG U-100 INSULIN ASPART) 100 unit/mL vial Take 1-20 units SQ QID AC and HS as directed 20 mL 1    insulin glargine (BASAGLAR KWIKPEN U-100 INSULIN) 100 unit/mL (3 mL) injection pen Inject 0.24 mL (24 Units total) under the skin nightly. (Patient taking differently: Inject 0.18 mL (18 Units total) under the skin nightly.) 21.6 mL 0    ipratropium (ATROVENT) 0.02 % nebulizer solution Inhale 2.5 mL (500 mcg total) by nebulization four (4) times a day. 360 mL 3    OXYGEN-AIR DELIVERY SYSTEMS MISC Dose: 2-3 LPM      posaconazole (NOXAFIL) 100 mg delayed released tablet Take 2 tablets (200 mg total) by mouth daily. 60 tablet 3    sildenafiL, pulm.hypertension, (REVATIO) 20 mg tablet Take 1 tablet (20 mg total) by mouth Three (3) times a day. 270 tablet 0    sodium chloride 3 % NEBULIZER solution Inhale 4 mL by nebulization every six (6) hours as needed for cough. 750 mL 5     No current facility-administered medications for this visit.       Health Maintenance:     Health Maintenance   Topic Date Due    HPV Cotest with Pap Smear (21-65)  Never done    Pap Smear with Cotest HPV (21-65)  02/27/2001    Zoster Vaccines (1 of 2) Never done    DTaP/Tdap/Td Vaccines (2 - Td or Tdap) 07/07/2017    Colon Cancer Screening  07/18/2017    Mammogram Start Age 81  03/01/2019    Retinal Eye Exam  11/15/2019    COVID-19 Vaccine (5 - 2023-24 season) 11/18/2021    COPD Spirometry  01/04/2022    Hemoglobin A1c  08/14/2022    Urine Albumin/Creatinine Ratio  01/02/2023    Foot Exam  05/17/2023    Serum Creatinine Monitoring  06/02/2023    Potassium Monitoring  06/02/2023    Lipid Screening  05/17/2027    Pneumococcal Vaccine 0-64  Completed    Hepatitis C Screen  Completed Influenza Vaccine  Completed       Immunizations:     Immunization History   Administered Date(s) Administered    COVID-19 VAC,BIVALENT(51YR UP),PFIZER 06/29/2021    COVID-19 VACC,MRNA,(PFIZER)(PF) 06/11/2019, 07/02/2019, 11/21/2019    INFLUENZA INJ MDCK PF, QUAD,(FLUCELVAX)(14MO AND UP EGG FREE) 01/19/2021    INFLUENZA TIV (TRI) PF (IM) 01/02/2005, 12/14/2008    Influenza Vaccine Quad(IM)6 MO-Adult(PF) 11/28/2012, 12/19/2014, 12/16/2015, 01/03/2017, 12/06/2017, 01/08/2019, 12/15/2019, 12/21/2021    PNEUMOCOCCAL POLYSACCHARIDE 23-VALENT 07/08/2007, 01/03/2017    PPD Test 01/19/2015    Pneumococcal Conjugate 20-valent 05/16/2022    TdaP 07/08/2007     I have reviewed and (if needed) updated the patient's problem list, medications, allergies, past medical and surgical history, social and family history.     Vital Signs:     Wt Readings from Last 3 Encounters:   06/12/22 89.8 kg (198 lb)   05/16/22 89.4 kg (197 lb)   06/29/21 94.8 kg (208 lb 15.9 oz)     Temp Readings from Last 3 Encounters:   06/12/22 36.9 ??C (98.4 ??F) (Oral)   05/16/22 36.9 ??C (98.4 ??F) (Oral)   11/19/21 37 ??C (98.6 ??F) (Oral)     BP Readings from Last 3 Encounters:   06/12/22 118/76   05/16/22 112/76   11/19/21 130/81     Pulse Readings from Last 3 Encounters:  06/12/22 88   05/16/22 90   11/19/21 82     Estimated body mass index is 27.62 kg/m?? as calculated from the following:    Height as of 06/12/22: 180.3 cm (5' 11).    Weight as of 06/12/22: 89.8 kg (198 lb).  No height and weight on file for this encounter.      Objective:      General: Alert and oriented x3. Well-appearing. No acute distress. ***  HEENT:  Normocephalic.  Atraumatic. Conjunctiva and sclera normal. OP MMM without lesions. ***  Neck:  Supple. No thyroid enlargement. No adenopathy. ***  Heart:  Regular rate and rhythm. Normal S1, S2. No murmurs, rubs or gallops. ***  Lungs:  No respiratory distress.  Lungs clear to auscultation. No wheezes, rhonchi, or rales. ***  GI/GU:  Soft, +BS, nondistended, non-TTP. No palpable masses or organomegaly. ***  Extremities:  No edema. Peripheral pulses normal. ***  Skin:  Warm, dry. No rash or lesions present. ***  Neuro:  Non-focal. No obvious weakness. ***  Psych:  Affect normal, eye contact good, speech clear and coherent. ***       I attest that I, Arta Bruce, personally documented this note while acting as scribe for Noralyn Pick, FNP.      Arta Bruce, Scribe.  09/05/2022     The documentation recorded by the scribe accurately reflects the service I personally performed and the decisions made by me.     Noralyn Pick, FNP

## 2022-09-06 DIAGNOSIS — B441 Other pulmonary aspergillosis: Principal | ICD-10-CM

## 2022-09-06 MED ORDER — POSACONAZOLE 100 MG TABLET,DELAYED RELEASE
ORAL_TABLET | Freq: Every day | ORAL | 0 refills | 30 days | Status: CP
Start: 2022-09-06 — End: ?

## 2022-09-06 NOTE — Unmapped (Signed)
Medication Requested: posaconazole      Future Appointments   Date Time Provider Department Center   09/25/2022  2:20 PM Loran Senters, FNP [facility]     Per Provider Note:     posaconazole (NOXAFIL) 100 mg delayed released tablet Take 2 tablets (200 mg total) by mouth daily.       Standing order protocol requirements met?: No    Sent to: Provider for signing    Days Supply Given: TBD by provider  Number of Refills: TBD by provider

## 2022-09-08 NOTE — Unmapped (Signed)
PA ATROVENT initiated electronically via Cover My Meds

## 2022-09-14 NOTE — Unmapped (Signed)
Assessment and Plan:     Type 2 diabetes mellitus with hyperglycemia, with long-term current use of insulin (CMS-HCC)  HGB A1c 8.3 (down from 9.0 five months ago).   DM uncontrolled. Continue insulin glargine 18 units nightly and insulin aspart 1-20 units QID AC and HS as directed. Encouraged patient to continue carb controlled diet and regular exercise.   - POCT glycosylated hemoglobin (Hb A1C); Future  - Ambulatory referral to Ophthalmology; Future  - POCT glycosylated hemoglobin (Hb A1C)    Screening for colon cancer  Recommended patient schedule a colonoscopy, FIT test, or do a Cologuard, patient agrees to FIT test. Ordered and provided to patient.  - Immunochemical Fecal Occult Blood Test (FIT), automated; Future    Productive cough  Patient presents today for pulmonary symptoms. Counseled patient on likelihood of pneumonia due to decreased airflow and the pain she has been experiencing in her lungs. Recommended beginning an antibiotic and obtaining a chest x-ray, patient agreeable. Trial azithromycin 250 mg 2 tablets on day 1 and 1 tablet on day 2-5 and Augmentin 875-125 mg BID x 10 days. Chest x-ray ordered, will contact patient with the results. Recommended patient see pulmonology for suggestions for replacement of Breo.If symptoms worsen or persist contact this provider.  - XR Chest 2 views; Future  - amoxicillin-clavulanate (AUGMENTIN) 875-125 mg per tablet; Take 1 tablet by mouth two (2) times a day for 10 days.  - azithromycin (ZITHROMAX Z-PAK) 250 MG tablet; Take 2 tablets (500 mg) on  Day 1,  followed by 1 tablet (250 mg) once daily on Days 2 through 5.    SOB (shortness of breath)  - XR Chest 2 views; Future  - amoxicillin-clavulanate (AUGMENTIN) 875-125 mg per tablet; Take 1 tablet by mouth two (2) times a day for 10 days.  - azithromycin (ZITHROMAX Z-PAK) 250 MG tablet; Take 2 tablets (500 mg) on  Day 1,  followed by 1 tablet (250 mg) once daily on Days 2 through 5.     Primary Hypertension  BP at goal (100/66 in clinic today).   Continue sildenafil 20 mg TID and amlodipine 5 mg daily.   Reviewed low sodium diet and encouraged regular exercise.   Advised to continue to monitor and log at-home BP readings. Contact clinic if readings become elevated.       I personally spent 40 minutes face-to-face and non-face-to-face in the care of this patient, which includes all pre, intra, and post visit time on the date of service.    Return in about 3 months (around 12/26/2022) for Next scheduled follow up.    HPI:      Karen Barber is here for   Chief Complaint   Patient presents with    Diabetes     Fasting.  Agrees to do diabetic eye exam with [facility]    Hypertension    Breast cancer screening     Gave her number to schedule mammo.  Ordered 04/2022.    Colon Cancer Screening     Agrees to do FIT, sent FIT home with patient    Medication Reaction     Advair, Tyvaso, and Breo cause HA's, LT upper chest pain and in her lung, hurts worse to cough or breathe.  SX x 3 days.  Sent MyChart message to Pulmonologist and has an appt there on 10/05/2022.    Pap reminder     Patient believes that she has another respiratory tract infection. She notes that she has been given Advair,  Tyvaso, and Breo in the past for respiratory tract infections and they all caused HA's, LT upper chest pain and in her lung, hurts worse to cough or breathe. SX x 3 days. Sent MyChart message to Pulmonologist and has an appt there on 10/05/2022. Endorses fever and HA. Denies sick contacts. Patient says that she is coughing up tan phlegm. She is not taking any cough medicine.    Depression: Patient bring up concerns of depression, which began after her mother passed away in Jan 10, 2023. She states that she prefers to sleep all of the time to avoid stressful situations.  She takes sleeping pills to try and help herself sleep more. Patient was started on Lexapro 05/16/22, she is no longer taking Lexapro      Diabetes: Patient is managing with Basaglar 18 units nightly and Novolog sliding scale insulin. Patient checks her insulin fasting as well as post prandially. Her blood sugars range between 140-187. Patient uses sliding scale insulin when BG's are elevated post prandially. Patient has had two toes amputated. Denies chest pain, SOB, edema, increased thirst/hunger/urination. Endorses vision changes for reading.      Hypertension: Patient presents for follow-up of hypertension. Blood pressure goal < 140/90.  Hypertension has customarily been at goal complicated by DM. Home blood pressure readings: did not bring log, notes high reading this morning on wrist cuff. Medication compliance: taking sildenafil 20 mg TID and amlodipine 5 mg daily as prescribed. She is not doing regular exercise.       She is using 5-8 L of oxygen consistently.      ROS:      Comprehensive 10 point ROS negative unless otherwise stated in the HPI.      [facility] Components:     Medication adherence and barriers to the treatment plan have been addressed. Opportunities to optimize healthy behaviors have been discussed. Patient / caregiver voiced understanding.    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 A.niger from sputum 08/2016-09/2016.    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    Sarcoidosis 1995    Type 2 diabetes mellitus with hyperglycemia (CMS-HCC) 07/27/2014 Visual impairment     glasses     Past Surgical History:   Procedure Laterality Date    LUNG LOBECTOMY      PR AMPUTATION TOE,MT-P JT Right 12/12/2019    Procedure: Right foot fifth toe amputation at the metatarsophalangeal joint level;  Surgeon: Britt Bottom, DPM;  Location: MAIN OR [facility];  Service: Vascular    PR AMPUTATION TOE,MT-P JT Right 06/16/2020    Procedure: Right foot amputation of 3rd toe at mpj level;  Surgeon: Britt Bottom, DPM;  Location: MAIN [facility];  Service: Vascular  PR RIGHT HEART CATH O2 SATURATION & CARDIAC OUTPUT N/A 04/29/2019    Procedure: Right Heart Catheterization;  Surgeon: Rosana Hoes, MD;  Location: [facility] CATH;  Service: Cardiology    PR THORACOSCOPY SURG TOT PULM DECORT Left 12/14/2014    Procedure: THORACOSCOPY SURG; W/TOT PULM DECORTIC/PNEUMOLYS;  Surgeon: Evert Kohl, MD;  Location: MAIN [facility];  Service: Cardiothoracic    PR THORACOSCOPY W/THERA WEDGE RESEXN INITIAL UNILAT Left 12/03/2014    Procedure: THORACOSCOPY, SURGICAL; WITH THERAPEUTIC WEDGE RESECTION (EG, MASS, NODULE) INITIAL UNILATERAL;  Surgeon: Evert Kohl, MD;  Location: MAIN OR [facility];  Service: Cardiothoracic       Family History:     Family History   Problem Relation Age of Onset    Dementia Mother     Diabetes Father     Diabetes Brother     Diabetes Maternal Aunt     Sarcoidosis Maternal Aunt     Diabetes Maternal Uncle     Diabetes Paternal Aunt     Diabetes Paternal Uncle        Social History:     Social History     Tobacco Use    Smoking status: Former     Current packs/day: 0.00     Average packs/day: 0.5 packs/day for 9.0 years (4.5 ttl pk-yrs)     Types: Cigarettes     Start date: 05/02/1978     Quit date: 05/03/1987     Years since quitting: 35.4    Smokeless tobacco: Never   Vaping Use    Vaping status: Never Used   Substance Use Topics    Alcohol use: No     Alcohol/week: 0.0 standard drinks of alcohol    Drug use: No       Allergies:     Patient has no known allergies.    Current Medications:     Current Outpatient Medications   Medication Sig Dispense Refill    ACCU-CHEK GUIDE GLUCOSE METER Misc 1 Device by Other route Four (4) times a day (before meals and nightly). AS DIRECTED 1 kit 0    albuterol 2.5 mg /3 mL (0.083 %) nebulizer solution Inhale 3 mL (2.5 mg total) by nebulization every six (6) hours as needed for wheezing or shortness of breath (airway clearance/cough). 360 mL 3    albuterol HFA 90 mcg/actuation inhaler Inhale 2 puffs every four (4) hours as needed for wheezing or shortness of breath. 54 g 3    amlodipine (NORVASC) 5 MG tablet Take 1 tablet (5 mg total) by mouth daily. Added per patient 90 tablet 3    aspirin-caffeine (BAYER BACK AND BODY) 500-32.5 mg Tab Take by mouth. Takes 2 1/2 pills 3-4 times a day as needed.      blood sugar diagnostic Strp by Other route Three (3) times a day. ACCU-CHEK Guide meter 300 strip 1    BREO ELLIPTA 200-25 mcg/dose inhaler Inhale 1 puff daily.      cyclobenzaprine (FLEXERIL) 10 MG tablet Take 1 tablet (10 mg total) by mouth Three (3) times a day as needed. 90 tablet 2    furosemide (LASIX) 40 MG tablet Take 1 tablet (40 mg total) by mouth two (2) times a day.      gabapentin (NEURONTIN) 300 MG capsule Take 1 capsule (300 mg total) by mouth Three (3) times a day. 270 capsule 3    insulin aspart (NOVOLOG U-100 INSULIN ASPART) 100 unit/mL vial Take 1-20 units SQ QID AC and HS  as directed 20 mL 1    insulin glargine (BASAGLAR KWIKPEN U-100 INSULIN) 100 unit/mL (3 mL) injection pen Inject 0.24 mL (24 Units total) under the skin nightly. (Patient taking differently: Inject 0.18 mL (18 Units total) under the skin nightly.) 21.6 mL 0    ipratropium (ATROVENT) 0.02 % nebulizer solution Inhale 2.5 mL (500 mcg total) by nebulization four (4) times a day. 360 mL 3    OXYGEN-AIR DELIVERY SYSTEMS MISC Dose: 2-3 LPM      posaconazole (NOXAFIL) 100 mg delayed released tablet Take 2 tablets by mouth once daily 60 tablet 0 sildenafiL, pulm.hypertension, (REVATIO) 20 mg tablet Take 1 tablet (20 mg total) by mouth Three (3) times a day. 270 tablet 0    amoxicillin-clavulanate (AUGMENTIN) 875-125 mg per tablet Take 1 tablet by mouth two (2) times a day for 10 days. 20 tablet 0    azithromycin (ZITHROMAX Z-PAK) 250 MG tablet Take 2 tablets (500 mg) on  Day 1,  followed by 1 tablet (250 mg) once daily on Days 2 through 5. 2 tablet 0    budesonide-formoteroL (SYMBICORT) 160-4.5 mcg/actuation inhaler Inhale 2 puffs Two (2) times a day. 10.2 g 3     No current facility-administered medications for this visit.       Health Maintenance:     Health Maintenance   Topic Date Due    HPV Cotest with Pap Smear (21-65)  Never done    Pap Smear with Cotest HPV (21-65)  02/27/2001    DTaP/Tdap/Td Vaccines (2 - Td or Tdap) 07/07/2017    Colon Cancer Screening  07/18/2017    Mammogram Start Age 39  03/01/2019    Retinal Eye Exam  11/15/2019    COVID-19 Vaccine (5 - 2023-24 season) 11/18/2021    COPD Spirometry  01/04/2022    Influenza Vaccine (1) 11/19/2022    Hemoglobin A1c  12/26/2022    Urine Albumin/Creatinine Ratio  01/02/2023    Foot Exam  05/17/2023    Serum Creatinine Monitoring  06/02/2023    Potassium Monitoring  06/02/2023    Lipid Screening  05/17/2027    Pneumococcal Vaccine 0-64  Completed    Hepatitis C Screen  Completed    Zoster Vaccines  Discontinued       Immunizations:     Immunization History   Administered Date(s) Administered    COVID-19 VAC,BIVALENT(49YR UP),PFIZER 06/29/2021    COVID-19 VACC,MRNA,(PFIZER)(PF) 06/11/2019, 07/02/2019, 11/21/2019    INFLUENZA INJ MDCK PF, QUAD,(FLUCELVAX)(62MO AND UP EGG FREE) 01/19/2021    INFLUENZA TIV (TRI) PF (IM) 01/02/2005, 12/14/2008    Influenza Vaccine Quad(IM)6 MO-Adult(PF) 11/28/2012, 12/19/2014, 12/16/2015, 01/03/2017, 12/06/2017, 01/08/2019, 12/15/2019, 12/21/2021    PNEUMOCOCCAL POLYSACCHARIDE 23-VALENT 07/08/2007, 01/03/2017    PPD Test 01/19/2015    Pneumococcal Conjugate 20-valent 05/16/2022    TdaP 07/08/2007     I have reviewed and (if needed) updated the patient's problem list, medications, allergies, past medical and surgical history, social and family history.     Vital Signs:     Wt Readings from Last 3 Encounters:   09/25/22 83.9 kg (185 lb)   06/12/22 89.8 kg (198 lb)   05/16/22 89.4 kg (197 lb)     Temp Readings from Last 3 Encounters:   09/25/22 37.6 ??C (99.6 ??F) (Oral)   06/12/22 36.9 ??C (98.4 ??F) (Oral)   05/16/22 36.9 ??C (98.4 ??F) (Oral)     BP Readings from Last 3 Encounters:   09/25/22 100/66   06/12/22 118/76   05/16/22 112/76  Pulse Readings from Last 3 Encounters:   09/25/22 87   06/12/22 88   05/16/22 90     Estimated body mass index is 25.8 kg/m?? as calculated from the following:    Height as of this encounter: 180.3 cm (5' 11).    Weight as of this encounter: 83.9 kg (185 lb).  Facility age limit for growth %iles is 20 years.      Objective:      General: Alert and oriented x3. Well-appearing. No acute distress.   HEENT:  Normocephalic.  Atraumatic. Conjunctiva and sclera normal.   Neck:  Supple.  Heart:  Regular rate and rhythm. Normal S1, S2. No murmurs, rubs or gallops.  Lungs:  No respiratory distress.  Lungs clear to auscultation. No wheezes, rhonchi, or rales. Extremely diminished throughout all lung fields. Little to no air movement in LUL. No wheeze auscultated.   Skin:  Warm, dry. No rash or lesions present on visible skin.  Neuro:  Non-focal. Riding in wheelchair.   Psych:  Affect normal, eye contact good, speech clear and coherent.        I attest that I, Arta Bruce, personally documented this note while acting as scribe for Noralyn Pick, FNP.      Arta Bruce, Scribe.  09/25/2022     The documentation recorded by the scribe accurately reflects the service I personally performed and the decisions made by me.     Noralyn Pick, FNP

## 2022-09-25 ENCOUNTER — Ambulatory Visit: Admit: 2022-09-25 | Discharge: 2022-09-26 | Payer: MEDICARE | Attending: Family | Primary: Family

## 2022-09-25 DIAGNOSIS — Z1211 Encounter for screening for malignant neoplasm of colon: Principal | ICD-10-CM

## 2022-09-25 DIAGNOSIS — R0602 Shortness of breath: Principal | ICD-10-CM

## 2022-09-25 DIAGNOSIS — E1165 Type 2 diabetes mellitus with hyperglycemia: Principal | ICD-10-CM

## 2022-09-25 DIAGNOSIS — R058 Productive cough: Principal | ICD-10-CM

## 2022-09-25 DIAGNOSIS — Z794 Long term (current) use of insulin: Principal | ICD-10-CM

## 2022-09-25 MED ORDER — AMOXICILLIN 875 MG-POTASSIUM CLAVULANATE 125 MG TABLET
ORAL_TABLET | Freq: Two times a day (BID) | ORAL | 0 refills | 10 days | Status: CP
Start: 2022-09-25 — End: 2022-10-05

## 2022-09-25 MED ORDER — AZITHROMYCIN 250 MG TABLET
ORAL_TABLET | 0 refills | 5 days | Status: CP
Start: 2022-09-25 — End: 2022-09-30
  Filled 2022-11-12: qty 30, 30d supply, fill #0

## 2022-10-21 ENCOUNTER — Ambulatory Visit: Admit: 2022-10-21 | Discharge: 2022-10-25 | Disposition: A | Discharge status: Home / Self Care | Payer: MEDICARE

## 2022-10-21 LAB — CBC W/ AUTO DIFF
BASOPHILS ABSOLUTE COUNT: 0.1 10*9/L (ref 0.0–0.1)
BASOPHILS RELATIVE PERCENT: 1.2 %
EOSINOPHILS ABSOLUTE COUNT: 0.4 10*9/L (ref 0.0–0.5)
EOSINOPHILS RELATIVE PERCENT: 7.7 %
HEMATOCRIT: 33.9 % — ABNORMAL LOW (ref 34.0–44.0)
HEMOGLOBIN: 11.1 g/dL — ABNORMAL LOW (ref 11.3–14.9)
LYMPHOCYTES ABSOLUTE COUNT: 0.9 10*9/L — ABNORMAL LOW (ref 1.1–3.6)
LYMPHOCYTES RELATIVE PERCENT: 17.8 %
MEAN CORPUSCULAR HEMOGLOBIN CONC: 32.6 g/dL (ref 32.0–36.0)
MEAN CORPUSCULAR HEMOGLOBIN: 26.1 pg (ref 25.9–32.4)
MEAN CORPUSCULAR VOLUME: 80 fL (ref 77.6–95.7)
MEAN PLATELET VOLUME: 8.1 fL (ref 6.8–10.7)
MONOCYTES ABSOLUTE COUNT: 0.5 10*9/L (ref 0.3–0.8)
MONOCYTES RELATIVE PERCENT: 9.9 %
NEUTROPHILS ABSOLUTE COUNT: 3.4 10*9/L (ref 1.8–7.8)
NEUTROPHILS RELATIVE PERCENT: 63.4 %
NUCLEATED RED BLOOD CELLS: 0 /100{WBCs} (ref <=4)
PLATELET COUNT: 245 10*9/L (ref 150–450)
RED BLOOD CELL COUNT: 4.24 10*12/L (ref 3.95–5.13)
RED CELL DISTRIBUTION WIDTH: 14.4 % (ref 12.2–15.2)
WBC ADJUSTED: 5.3 10*9/L (ref 3.6–11.2)

## 2022-10-21 LAB — COMPREHENSIVE METABOLIC PANEL
ALBUMIN: 3.6 g/dL (ref 3.4–5.0)
ALKALINE PHOSPHATASE: 84 U/L (ref 46–116)
ALT (SGPT): 11 U/L (ref 10–49)
ANION GAP: 6 mmol/L (ref 5–14)
AST (SGOT): 18 U/L (ref <=34)
BILIRUBIN TOTAL: 0.4 mg/dL (ref 0.3–1.2)
BLOOD UREA NITROGEN: 22 mg/dL (ref 9–23)
BUN / CREAT RATIO: 19
CALCIUM: 9.7 mg/dL (ref 8.7–10.4)
CHLORIDE: 101 mmol/L (ref 98–107)
CO2: 31.1 mmol/L — ABNORMAL HIGH (ref 20.0–31.0)
CREATININE: 1.13 mg/dL — ABNORMAL HIGH
EGFR CKD-EPI (2021) FEMALE: 56 mL/min/{1.73_m2} — ABNORMAL LOW (ref >=60)
GLUCOSE RANDOM: 210 mg/dL — ABNORMAL HIGH (ref 70–179)
POTASSIUM: 4.3 mmol/L (ref 3.4–4.8)
PROTEIN TOTAL: 7.8 g/dL (ref 5.7–8.2)
SODIUM: 138 mmol/L (ref 135–145)

## 2022-10-21 LAB — MAGNESIUM: MAGNESIUM: 1.8 mg/dL (ref 1.6–2.6)

## 2022-10-21 LAB — BLOOD GAS CRITICAL CARE PANEL, VENOUS
BASE EXCESS VENOUS: 7.9 — ABNORMAL HIGH (ref -2.0–2.0)
CALCIUM IONIZED VENOUS (MG/DL): 4.93 mg/dL (ref 4.40–5.40)
GLUCOSE WHOLE BLOOD: 213 mg/dL
HCO3 VENOUS: 29 mmol/L — ABNORMAL HIGH (ref 22–27)
HEMOGLOBIN BLOOD GAS: 11.4 g/dL — ABNORMAL LOW (ref 12.00–16.00)
LACTATE BLOOD VENOUS: 2.4 mmol/L — ABNORMAL HIGH (ref 0.5–1.8)
O2 SATURATION VENOUS: 49.7 % (ref 40.0–85.0)
PCO2 VENOUS: 62 mmHg — ABNORMAL HIGH (ref 40–60)
PH VENOUS: 7.34 (ref 7.32–7.43)
PO2 VENOUS: 31 mmHg — ABNORMAL LOW (ref 35–40)
POTASSIUM WHOLE BLOOD: 4.1 mmol/L (ref 3.4–4.6)
SODIUM WHOLE BLOOD: 143 mmol/L (ref 135–145)

## 2022-10-21 LAB — B-TYPE NATRIURETIC PEPTIDE: B-TYPE NATRIURETIC PEPTIDE: 37.21 pg/mL (ref <=100)

## 2022-10-21 LAB — HIGH SENSITIVITY TROPONIN I - SINGLE: HIGH SENSITIVITY TROPONIN I: 7 ng/L (ref <=34)

## 2022-10-21 MED ADMIN — furosemide (LASIX) injection 40 mg: 40 mg | INTRAVENOUS | @ 20:00:00

## 2022-10-21 MED ADMIN — posaconazole (NOXAFIL) delayed released tablet 200 mg: 200 mg | ORAL | @ 21:00:00

## 2022-10-21 MED ADMIN — ipratropium-albuterol (DUO-NEB) 0.5-2.5 mg/3 mL nebulizer solution 3 mL: 3 mL | RESPIRATORY_TRACT | @ 21:00:00

## 2022-10-21 MED ADMIN — vancomycin (VANCOCIN) 2000 mg in sodium chloride (NS) 0.9% 500 mL IVPB: 2000 mg | INTRAVENOUS | @ 16:00:00 | Stop: 2022-10-21

## 2022-10-21 MED ADMIN — sildenafiL (pulm.hypertension) (REVATIO) tablet 20 mg: 20 mg | ORAL | @ 21:00:00

## 2022-10-21 MED ADMIN — iohexol (OMNIPAQUE) 350 mg iodine/mL solution 75 mL: 75 mL | INTRAVENOUS | @ 17:00:00 | Stop: 2022-10-21

## 2022-10-21 MED ADMIN — gabapentin (NEURONTIN) capsule 300 mg: 300 mg | ORAL | @ 21:00:00

## 2022-10-21 MED ADMIN — methylPREDNISolone sodium succinate (PF) (SOLU-Medrol) injection 125 mg: 125 mg | INTRAVENOUS | @ 15:00:00 | Stop: 2022-10-21

## 2022-10-21 MED ADMIN — piperacillin-tazobactam (ZOSYN) 3.375 g in sodium chloride 0.9 % (NS) 100 mL IVPB-MBP: 3.375 g | INTRAVENOUS | @ 15:00:00 | Stop: 2022-10-21

## 2022-10-21 MED ADMIN — insulin lispro (HumaLOG) injection 0-20 Units: 0-20 [IU] | SUBCUTANEOUS | @ 21:00:00

## 2022-10-21 MED ADMIN — amlodipine (NORVASC) tablet 5 mg: 5 mg | ORAL | @ 21:00:00

## 2022-10-21 NOTE — Unmapped (Signed)
[name]  Emergency Department Provider Note       ED Clinical Impression     Final diagnoses:   Dyspnea, unspecified type (Primary)        Impression, ED Course, Assessment and Plan     Impression:     Patient is a 57 y.o. 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     On exam, the patient is alert, oriented, and in no acute distress. VS are notable for tachypnea (22) but are otherwise within normal limits. Exam is remarkable for right-sided crackles, wheezing, as well as end expiratory wheezing on the left.  No accessory muscle use.  Trace bilateral lower extremity edema which patient states she has had in the past.    Differential includes pneumonia, COPD, pulmonary embolism, sarcoidosis, pulmonary hypertension exacerbation, CHF although no overt volume overload.  Also consider sepsis, bacteremia, acute respiratory failure.    Plan for EKG, blood cultures, VBG, troponin, BNP, magnesium levels, basic labs, and CXR. Will give vancomycin, pulmonary toilet with nebs, solu-medrol, and empiric antibiotic coverage Zosyn.  Will also evaluate with CTA for PE.    Additional Progress Notes    1:06 PM  MAO paged for admission for increasing O2 requirement in the setting of pulmonary sarcoidosis    1:32 PM imaging as below with chronic findings.  Reassessment patient is feeling much improved however still feels as though with any exertion because she becomes quite winded.  Discussed the case with admitting provider, Dr. Maisie Fus, who will plan for evaluation and admission of the patient.    Additional Medical Decision Making       Discussion of Management with other Physicians, QHP or Appropriate Source: As above  Independent Interpretation of Studies:   EKG: as below    RADIOLOGY: as below     Labs with mildly elevated lactate at 2.4, mild chronic anemia with a hemoglobin 11.1,  External Records Reviewed: Patient's most recent outpatient clinic note - 09/25/22 [facility] Family Medicine Note for past medical history and chart review and 05/31/22 [facility] Admission note for past medical history  Escalation of Care, Consideration of Admission/Observation/Transfer:  N/A  Social determinants that significantly affected care: None applicable  Prescription drug(s) considered but not prescribed:   Diagnostic tests considered but not performed:   History obtained from other sources: None    Labs and radiology results that were available during my care of the patient were independently reviewed by me and considered in my medical decision making.    Portions of this record have been created using Scientist, clinical (histocompatibility and immunogenetics). Dictation errors have been sought, but may not have been identified and corrected.  ____________________________________________    Time seen: October 21, 2022 10:42 AM     History     Chief Complaint  Shortness of Breath      HPI   Karen Barber is a 57 y.o. 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 The patient reports several days of worsening shortness of breath in the setting of a pneumonia diagnosis 4 weeks ago. She states that she was  seen by her PCP 4 weeks ago, where she was diagnosed with pneumonia and prescribed antibiotics. Per chart review, the patient was started on a 5 day course of azithromycin and 10 day course of Augmentin. She notes that her symptoms resolved for 1-2 weeks with the antibiotics, but returned 4 days later with a dyspnea on exertion and a new oxygen requirement. She states that she typically uses 5-6L of oxygen, but had to use 8-9L intermittently since her symptoms worsened.  She states that with any exertion she becomes extremely winded and her oxygen saturation dropped to the high 60s/low 70s.  She additionally endorses new, pink-colored sputum production.  On interview, she endorses leg swelling. Denies chest pain.       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 A.niger from sputum 08/2016-09/2016.    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    Sarcoidosis 1995    Type 2 diabetes mellitus with hyperglycemia (CMS-HCC) 07/27/2014    Visual impairment     glasses       Past Surgical History:   Procedure Laterality Date    LUNG LOBECTOMY      PR AMPUTATION TOE,MT-P JT Right 12/12/2019    Procedure: Right foot fifth toe amputation at the metatarsophalangeal joint level;  Surgeon: Britt Bottom, DPM;  Location: MAIN OR [facility];  Service: Vascular    PR AMPUTATION TOE,MT-P JT Right 06/16/2020    Procedure: Right foot amputation of 3rd toe at mpj level;  Surgeon: Britt Bottom, DPM;  Location: MAIN OR [facility];  Service: Vascular    PR RIGHT HEART CATH O2 SATURATION & CARDIAC OUTPUT N/A 04/29/2019    Procedure: Right Heart Catheterization;  Surgeon: Rosana Hoes, MD;  Location: [facility] CATH;  Service: Cardiology    PR THORACOSCOPY SURG TOT PULM DECORT Left 12/14/2014    Procedure: THORACOSCOPY SURG; W/TOT PULM DECORTIC/PNEUMOLYS;  Surgeon: Evert Kohl, MD;  Location: MAIN OR [facility];  Service: Cardiothoracic    PR THORACOSCOPY W/THERA WEDGE RESEXN INITIAL UNILAT Left 12/03/2014    Procedure: THORACOSCOPY, SURGICAL; WITH THERAPEUTIC WEDGE RESECTION (EG, MASS, NODULE) INITIAL UNILATERAL;  Surgeon: Evert Kohl, MD;  Location: MAIN OR [facility];  Service: Cardiothoracic       No current facility-administered medications for this encounter.    Current Outpatient Medications:     ACCU-CHEK GUIDE GLUCOSE METER Misc, 1 Device by Other route Four (4) times a day (before meals and nightly). AS DIRECTED, Disp: 1 kit, Rfl: 0    albuterol 2.5 mg /3 mL (0.083 %) nebulizer solution, Inhale 3 mL (2.5 mg total) by nebulization every six (6) hours as needed for wheezing or shortness of breath (airway clearance/cough)., Disp: 360 mL, Rfl: 3    albuterol HFA 90 mcg/actuation  inhaler, Inhale 2 puffs every four (4) hours as needed for wheezing or shortness of breath., Disp: 54 g, Rfl: 3    amlodipine (NORVASC) 5 MG tablet, Take 1 tablet (5 mg total) by mouth daily. Added per patient, Disp: 90 tablet, Rfl: 3    aspirin-caffeine (BAYER BACK AND BODY) 500-32.5 mg Tab, Take by mouth. Takes 2 1/2 pills 3-4 times a day as needed., Disp: , Rfl:     blood sugar diagnostic Strp, by Other route Three (3) times a day. ACCU-CHEK Guide meter, Disp: 300 strip, Rfl: 1    BREO ELLIPTA 200-25 mcg/dose inhaler, Inhale 1 puff daily., Disp: , Rfl:     furosemide (LASIX) 40 MG tablet, Take 1 tablet (40 mg total) by mouth two (2) times a day., Disp: , Rfl:     gabapentin (NEURONTIN) 300 MG capsule, Take 1 capsule (300 mg total) by mouth Three (3) times a day., Disp: 270 capsule, Rfl: 3    insulin aspart (NOVOLOG U-100 INSULIN ASPART) 100 unit/mL vial, Take 1-20 units SQ QID AC and HS as directed, Disp: 20 mL, Rfl: 1    insulin glargine (BASAGLAR KWIKPEN U-100 INSULIN) 100 unit/mL (3 mL) injection pen, Inject 0.24 mL (24 Units total) under the skin nightly. (Patient taking differently: Inject 0.18 mL (18 Units total) under the skin nightly.), Disp: 21.6 mL, Rfl: 0    ipratropium (ATROVENT) 0.02 % nebulizer solution, Inhale 2.5 mL (500 mcg total) by nebulization four (4) times a day., Disp: 360 mL, Rfl: 3    OXYGEN-AIR DELIVERY SYSTEMS MISC, Dose: 2-3 LPM, Disp: , Rfl:     posaconazole (NOXAFIL) 100 mg delayed released tablet, Take 2 tablets by mouth once daily, Disp: 60 tablet, Rfl: 0    sildenafiL, pulm.hypertension, (REVATIO) 20 mg tablet, Take 1 tablet (20 mg total) by mouth Three (3) times a day., Disp: 270 tablet, Rfl: 0    Allergies  Patient has no known allergies.    Family History   Problem Relation Age of Onset    Dementia Mother     Diabetes Father     Diabetes Brother     Diabetes Maternal Aunt     Sarcoidosis Maternal Aunt     Diabetes Maternal Uncle     Diabetes Paternal Aunt     Diabetes Paternal Uncle        Social History  Social History     Tobacco Use    Smoking status: Former     Current packs/day: 0.00     Average packs/day: 0.5 packs/day for 9.0 years (4.5 ttl pk-yrs)     Types: Cigarettes     Start date: 05/02/1978     Quit date: 05/03/1987     Years since quitting: 35.4    Smokeless tobacco: Never   Vaping Use    Vaping status: Never Used   Substance Use Topics    Alcohol use: No     Alcohol/week: 0.0 standard drinks of alcohol    Drug use: No            Physical Exam     VITAL SIGNS:    ED Triage Vitals [10/21/22 1032]   Enc Vitals Group      BP 131/85      Heart Rate 85      SpO2 Pulse       Resp 22      Temp 36.6 ??C (97.9 ??F)      Temp Source Oral  SpO2 100 %      Weight 83.9 kg (185 lb)     Constitutional: Alert and oriented. Well appearing and in no distress.  Eyes: Conjunctivae are normal.  ENT       Head: Normocephalic and atraumatic.       Nose: No congestion.       Mouth/Throat: Mucous membranes are moist.       Neck: No stridor.  Cardiovascular: Normal rate, regular rhythm.   Respiratory: As above  Gastrointestinal: Soft and nontender, non-distended  Genitourinary:   Musculoskeletal: No lower extremity tenderness or edema.  Neurologic: Normal speech and language. No gross focal neurologic deficits are appreciated.   Skin: Skin is warm, dry and intact. No rash noted.  Psychiatric: Mood and affect are normal. Speech and behavior are normal.     EKG     Sinus rhythm rate of 85 BPM. Frequent PVCs noted. T wave inversions inferolaterally as well as inferiorly. No st elevations. T wave inversion appear progress from prior EKG in 11/2021. No STEMI.      Radiology     CTA Chest W Contrast   Final Result      1.  No pulmonary emboli or evidence of pulmonary hemorrhage.   2.  Unchanged fibrotic pulmonary sarcoidosis with biapical upper lobe cystic bronchiectasis and scattered segmental and subsegmental fibrosis with bronchiectasis.    3.  Unchanged bulky eggshell calcified mediastinal and hilar lymphadenopathy compatible with pulmonary sarcoidosis.      XR Chest 2 views   Final Result      Unchanged patchy pulmonary fibrosis and cavitary lung lesions          I independently visualized these images.     Procedures       Pertinent labs & imaging results that were available during my care of the patient were reviewed by me and considered in my medical decision making (see chart for details).    Documentation assistance was provided by Orion Crook, Scribe, on October 21, 2022 at 10:42 AM for Carolee Rota, MD.     Documentation assistance was provided by the scribe in my presence.  The documentation recorded by the scribe has been reviewed by me and accurately reflects the services I personally performed.             Berkley Harvey, MD  10/21/22 [phone]

## 2022-10-21 NOTE — Unmapped (Addendum)
Patient presents here with c/o worsening of SOB since few weeks requiring her to increase her o2 usage in the past week. States that she was recently treated for pneumonia with antibiotics. H/o COPD/ sarcoidosis

## 2022-10-21 NOTE — Unmapped (Signed)
[facility]   History and Physical       Assessment and Plan     Karen Barber is a 57 y.o. female who is presenting to [facility] with Acute on chronic hypoxic respiratory failure (CMS-HCC), in the setting of the following pertinent/contributing co-morbidities: Pulmonary HTN, Sarcoidosis, Chronic pulmonary aspergillosis .      Acute on chronic hypoxic respiratory failure (CMS-HCC) likely due to fluid overload  Chronic illness with severe exacerbation or progression that has a significant risk of morbidity without appropriate treatment  Reports 3 week progressively worsening dyspnea on exertion after being treated for pneumonia about one month ago per patient. Uses 6L [place] at rest with increasing requirement up to 8-10L with exertion. Recently feels subjectively worse with exertion along with reported desaturations to the 70s despite increasing O2 requirement. Also with pink sputum production. No fevers, denies any sick contacts.  She does report she has been drinking more Kool aid rather than water so unsure how compliant she is with fluid restrictions.  Given her slightly increased LE edema, pink sputum and slighly elevated BNP from baseline, I feel that her symptoms might be more due to fluid overload rather than a new pulmonary process. CTA chest here showed no PE and unchanged fibrotic pulmonary sarcoidosis with upper lobe bronchiectasis which appears unchanged.  Will plan for diuresis and monitor symptoms. I do not feel that this is an new infectious process, so will hold off on antibiotics at this time.   - O2 therapy, baseline 6L at rest, goal O2>90%  - Start IV lasix 40mg  BID for now with close monitoring of kidney function  - Sputum culture ordered, hold off on further antibiotics for now  - continue duonebs q6h, prednisone 40mg  daily for 4 days  - Discussed with pulmonary, who will see tomorrow    Pulmonary HTN  Thought to be due to group 3 vs group 5. Most recent RHC 10/23 with RA 5, wedge 17, PA 71/30, mean 45. She reports compliance with revatio 20mg  TID and lasix 40mg  BID. 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 Reports that she stopped taking all inhalers except albuterol due to self reported risk of pneumonia associated with them.   - continue duonebs q6h and prednisone as above  - discussed with pulmonary who will see tomorrow    DMII on insulin  Last A1c 9 2/4.  On glargine 18U at bedtime and sliding scale novolog QID and HS.   - continue glargine at 18U, correctional dose insulin  - accuchecks AC/HS, hypoglycemic protocol    CKD stage 3  - baseline creatinine around 1.2, stable at 1.13    Prophylaxis  -lovenox    Diet  -Nutrition Therapy Consistent Carb; Consistent Carb 60/60/60 (4/4/4); Fluid 2000 ml    Code Status / HCDM   -Full Code,    -  HCDM (patient stated preference): Karen Barber - Domestic Partner - [phone]    Significant Comorbid Conditions:         Issues Impacting Complexity of Management:  -Need for escalation to higher hospital-level of care from Stepdown to ICU for access to  specialized services and increased monitoring  -Need for intensive oxygen therapy of LBNC, which places the patient at high risk for oxygen toxicity    Medical Decision Making: Reviewed records from the following unique sources  Previous pulmonary notes, hospitalizations. Discussed the patient's management and/or test interpretation with ED Provider as summarized within this note    I personally spent greater than 75 minutes face-to-face and non-face-to-face in the care of this patient, which includes all pre, intra, and post visit time on the date of service.  All documented time was specific to the E/M visit and does not include any procedures that may have been performed.    HPI      Karen Barber is a 57 y.o. female who is presenting to [facility] with Acute on chronic hypoxic respiratory failure (CMS-HCC).    57 y/o with a history of pulmonary hypertension, sarcoidosis, infective lung disease (aspergillus infection requiring LUL lobectomy 2016), COPD and diabetes mellitus type 2 who presents with increasing shortness of breath over the last few days.  She reports that she was treated for pneumonia about a month ago at which point since she has felt like she cannot catch her breath.  She reports that this has been progressively worse over the last few days and has required increased oxygen demands above her 6 L nasal cannula especially on exertion.  She also reports that over the last week she has had increasing pink sputum production that has been present intermittently in the past.    She has tried her nebulizers at home with minimal improvement and reports compliance with Lasix 40 mg twice daily.  She does report increased lower extremity edema and states that she has been drinking more fluids. She feels that this might be contributing to her worsening dyspnea on exertion as she feel that her breathing improves when her legs aren't swollen. Denies any chest pain, fevers, or sick contacts.     In the ED, patient afebrile, normotensive, was satting mid 90s and rest but quickly desaturates to low 80s with exertion requiring increases up to 10L LBNC. CTA negative for PE and unchanged pulmonary sarcoidosis.  BNP slightly elevated from previous and stable creatinine. Troponins negative. ECG showing previous seen 1st degree AV block with incomplete RBBB with PVC's       Med Rec Confidence   I reviewed the Medication List. The current list is Accurate    Physical Exam   Temp:  [36.6 ??C (97.9 ??F)-37.3 ??C (99.1 ??F)] 36.6 ??C (97.9 ??F)  Heart Rate:  [85-100] 95  SpO2 Pulse:  [89-95] 95  Resp:  [22-35] 27  BP: (121-137)/(65-85) 121/65  SpO2:  [92 %-100 %] 100 %  Body mass index is 26.5 kg/m??.  Gen: appears mildly uncomfortable  HEENT: [place]/AT, EOMI  Resp: crackles more prominent in R lung bases, decrease air movement in left lung field, slightly increased work of breathing   CV: RRR, no m/r/g  GI: soft, NT/ND, bowel sounds present  MSK: 1+ LE edema in ankles  Neuro: CN II-XII grossly intact, no focal deficits noted

## 2022-10-22 LAB — CBC
HEMATOCRIT: 33.4 % — ABNORMAL LOW (ref 34.0–44.0)
HEMOGLOBIN: 10.8 g/dL — ABNORMAL LOW (ref 11.3–14.9)
MEAN CORPUSCULAR HEMOGLOBIN CONC: 32.4 g/dL (ref 32.0–36.0)
MEAN CORPUSCULAR HEMOGLOBIN: 25.9 pg (ref 25.9–32.4)
MEAN CORPUSCULAR VOLUME: 79.7 fL (ref 77.6–95.7)
MEAN PLATELET VOLUME: 8.4 fL (ref 6.8–10.7)
PLATELET COUNT: 269 10*9/L (ref 150–450)
RED BLOOD CELL COUNT: 4.19 10*12/L (ref 3.95–5.13)
RED CELL DISTRIBUTION WIDTH: 14.5 % (ref 12.2–15.2)
WBC ADJUSTED: 7.8 10*9/L (ref 3.6–11.2)

## 2022-10-22 LAB — BASIC METABOLIC PANEL
ANION GAP: 7 mmol/L (ref 5–14)
ANION GAP: 5 mmol/L (ref 5–14)
BLOOD UREA NITROGEN: 31 mg/dL — ABNORMAL HIGH (ref 9–23)
BLOOD UREA NITROGEN: 32 mg/dL — ABNORMAL HIGH (ref 9–23)
BUN / CREAT RATIO: 24
BUN / CREAT RATIO: 24
CALCIUM: 9.5 mg/dL (ref 8.7–10.4)
CALCIUM: 9.7 mg/dL (ref 8.7–10.4)
CHLORIDE: 98 mmol/L (ref 98–107)
CHLORIDE: 100 mmol/L (ref 98–107)
CO2: 30.5 mmol/L (ref 20.0–31.0)
CO2: 32.8 mmol/L — ABNORMAL HIGH (ref 20.0–31.0)
CREATININE: 1.31 mg/dL — ABNORMAL HIGH
CREATININE: 1.33 mg/dL — ABNORMAL HIGH
EGFR CKD-EPI (2021) FEMALE: 47 mL/min/{1.73_m2} — ABNORMAL LOW (ref >=60)
EGFR CKD-EPI (2021) FEMALE: 46 mL/min/{1.73_m2} — ABNORMAL LOW (ref >=60)
GLUCOSE RANDOM: 464 mg/dL (ref 70–179)
GLUCOSE RANDOM: 286 mg/dL — ABNORMAL HIGH (ref 70–179)
POTASSIUM: 4.7 mmol/L (ref 3.4–4.8)
POTASSIUM: 5 mmol/L — ABNORMAL HIGH (ref 3.4–4.8)
SODIUM: 135 mmol/L (ref 135–145)
SODIUM: 138 mmol/L (ref 135–145)

## 2022-10-22 LAB — MAGNESIUM: MAGNESIUM: 1.9 mg/dL (ref 1.6–2.6)

## 2022-10-22 LAB — PHOSPHORUS: PHOSPHORUS: 4.8 mg/dL (ref 2.4–5.1)

## 2022-10-22 LAB — C-REACTIVE PROTEIN: C-REACTIVE PROTEIN: 6 mg/L (ref <=10.0)

## 2022-10-22 MED ADMIN — insulin lispro (HumaLOG) injection 0-20 Units: 0-20 [IU] | SUBCUTANEOUS | @ 16:00:00 | Stop: 2022-10-22

## 2022-10-22 MED ADMIN — sildenafiL (pulm.hypertension) (REVATIO) tablet 20 mg: 20 mg | ORAL | @ 18:00:00

## 2022-10-22 MED ADMIN — enoxaparin (LOVENOX) syringe 40 mg: 40 mg | SUBCUTANEOUS | @ 01:00:00

## 2022-10-22 MED ADMIN — insulin lispro (HumaLOG) injection 0-20 Units: 0-20 [IU] | SUBCUTANEOUS | @ 13:00:00 | Stop: 2022-10-22

## 2022-10-22 MED ADMIN — sildenafiL (pulm.hypertension) (REVATIO) tablet 20 mg: 20 mg | ORAL | @ 13:00:00

## 2022-10-22 MED ADMIN — gabapentin (NEURONTIN) capsule 300 mg: 300 mg | ORAL | @ 18:00:00

## 2022-10-22 MED ADMIN — insulin regular (HumuLIN,NovoLIN) injection 5 Units: 5 [IU] | INTRAVENOUS | @ 03:00:00 | Stop: 2022-10-21

## 2022-10-22 MED ADMIN — gabapentin (NEURONTIN) capsule 300 mg: 300 mg | ORAL | @ 01:00:00

## 2022-10-22 MED ADMIN — amlodipine (NORVASC) tablet 5 mg: 5 mg | ORAL | @ 13:00:00

## 2022-10-22 MED ADMIN — insulin regular (HumuLIN,NovoLIN) injection 8 Units: 8 [IU] | INTRAVENOUS | @ 16:00:00 | Stop: 2022-10-22

## 2022-10-22 MED ADMIN — predniSONE (DELTASONE) tablet 40 mg: 40 mg | ORAL | @ 13:00:00 | Stop: 2022-10-26

## 2022-10-22 MED ADMIN — insulin lispro (HumaLOG) injection 0-20 Units: 0-20 [IU] | SUBCUTANEOUS | @ 21:00:00

## 2022-10-22 MED ADMIN — ipratropium-albuterol (DUO-NEB) 0.5-2.5 mg/3 mL nebulizer solution 3 mL: 3 mL | RESPIRATORY_TRACT | @ 02:00:00

## 2022-10-22 MED ADMIN — sildenafiL (pulm.hypertension) (REVATIO) tablet 20 mg: 20 mg | ORAL | @ 02:00:00

## 2022-10-22 MED ADMIN — ipratropium-albuterol (DUO-NEB) 0.5-2.5 mg/3 mL nebulizer solution 3 mL: 3 mL | RESPIRATORY_TRACT | @ 09:00:00

## 2022-10-22 MED ADMIN — insulin lispro (HumaLOG) injection 0-20 Units: 0-20 [IU] | SUBCUTANEOUS | @ 01:00:00

## 2022-10-22 MED ADMIN — acetaminophen (TYLENOL) tablet 650 mg: 650 mg | ORAL | @ 16:00:00

## 2022-10-22 MED ADMIN — ipratropium-albuterol (DUO-NEB) 0.5-2.5 mg/3 mL nebulizer solution 3 mL: 3 mL | RESPIRATORY_TRACT | @ 14:00:00

## 2022-10-22 MED ADMIN — insulin regular (HumuLIN,NovoLIN) injection 15 Units: 15 [IU] | INTRAVENOUS | @ 21:00:00 | Stop: 2022-10-22

## 2022-10-22 MED ADMIN — ipratropium-albuterol (DUO-NEB) 0.5-2.5 mg/3 mL nebulizer solution 3 mL: 3 mL | RESPIRATORY_TRACT | @ 20:00:00

## 2022-10-22 MED ADMIN — sodium chloride 3 % NEBULIZER solution 4 mL: 4 mL | RESPIRATORY_TRACT | @ 20:00:00

## 2022-10-22 MED ADMIN — insulin glargine (LANTUS) injection 18 Units: 18 [IU] | SUBCUTANEOUS | @ 01:00:00

## 2022-10-22 MED ADMIN — furosemide (LASIX) tablet 40 mg: 40 mg | ORAL | @ 18:00:00

## 2022-10-22 MED ADMIN — insulin regular (HumuLIN,NovoLIN) injection 10 Units: 10 [IU] | INTRAVENOUS | @ 18:00:00 | Stop: 2022-10-22

## 2022-10-22 MED ADMIN — posaconazole (NOXAFIL) delayed released tablet 200 mg: 200 mg | ORAL | @ 13:00:00

## 2022-10-22 MED ADMIN — furosemide (LASIX) injection 40 mg: 40 mg | INTRAVENOUS | @ 10:00:00 | Stop: 2022-10-22

## 2022-10-22 MED ADMIN — gabapentin (NEURONTIN) capsule 300 mg: 300 mg | ORAL | @ 13:00:00

## 2022-10-22 NOTE — Unmapped (Signed)
CM met with patient in pt room.  Pt/visitors were not wearing hospital provided masks for the duration of the interaction. CM was wearing hospital provided surgical mask.  CM was within 6 foot of the patient/visitors during this interaction. CM spoke with the patient about discharge needs. Patient lives alone and her brother and friend are her main support. Patient reports being independent with all ADL's. Patient has RW, shower chair, BSC, toilet riser and power scooter at home. Patient also is open with [facility] for home oxygen- her baseline is 5L [place]. Patient is requesting Home Health Aide to help her out with housekeeping. CM will monitor discharge needs and follow up with care progression, as indicated.               Care Management  Initial Transition Planning Assessment      Type of Residence: Mailing Address:  [address]  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  Past Surgical History:   has a past surgical history that includes pr thoracoscopy w/thera wedge resexn initial unilat (Left, 12/03/2014); pr thoracoscopy surg tot pulm decort (Left, 12/14/2014); Lung lobectomy; pr right heart cath o2 saturation & cardiac output (N/A, 04/29/2019); pr amputation toe,mt-p jt (Right, 12/12/2019); and pr amputation toe,mt-p jt (Right, 06/16/2020).   Previous admit date: 12/11/2019    Primary Insurance- Payor: MEDICARE / Plan: MEDICARE PART A AND PART B / Product Type: *No Product type* /   Secondary Insurance - Secondary Insurance  MEDICAID [place]  Prescription Coverage - See above.  Preferred Pharmacy - [facility] - [place] ([place]), [place] - [address]    Transportation home: Private vehicle. Brother.             General  Care Manager assessed the patient by : In person interview with patient, Discussion with Clinical Care team, Medical record review  Orientation Level: Oriented X4  Functional level prior to admission: Independent  Reason for referral: Discharge Planning    Contact/Decision Maker  Extended Emergency Contact Information  Primary Emergency Contact: Austin,Ira  Address: [address]           [place], [place] [address] [place]  Home Phone: [phone]  Relation: Domestic Partner  Secondary Emergency Contact: Novella,Bryant  Address: [place], [place] [address] [place] [employer] Phone: [phone]  Relation: Brother    Armed forces operational officer Next of Kin / Guardian / POA / Advance Directives     HCDM (patient stated preference): [name] - Domestic Partner [phone]    Advance Directive (Medical Treatment)  Does patient have an advance directive covering medical treatment?: Patient has advance directive covering medical treatment, copy not in chart.  Advance directive covering medical treatment not in Chart:: Copy requested from family    Health Care Decision Maker [HCDM] (Medical & Mental Health Treatment)  Healthcare Decision Maker: HCDM documented in the HCDM/Contact Info section.  Information  offered on HCDM, Medical & Mental Health advance directives:: Patient declined information.       Readmission Information    Have you been hospitalized in the last 30 days?: No   Patient Information  Lives with: Alone    Type of Residence: Private residence        Location/Detail: [address] [place] [address]    Support Systems/Concerns: Family Members, Friends/Neighbors    Responsibilities/Dependents at home?: No    Home Care services in place prior to admission?: No   Equipment Currently Used at Home: walker, rolling, shower chair, commode chair, raised toilet seat, wheelchair, power     Currently receiving outpatient dialysis?: No     Financial Information       Need for financial assistance?: No     Social Determinants of Health  Social Determinants of Health were addressed in provider documentation.  Please refer to patient history.  Social Determinants of Health     Financial Resource Strain: Low Risk  (06/06/2022)    Received from [facility], [facility]    Overall Financial Resource Strain (CARDIA)     Difficulty of Paying Living Expenses: Not hard at all   Internet Connectivity: Not on file   Food Insecurity: No Food Insecurity (06/06/2022)    Received from [facility], [facility]    Hunger Vital Sign     Worried About Running Out of Food in the Last Year: Never true     Ran Out of Food in the Last Year: Never true   Tobacco Use: Medium Risk (10/21/2022)    Patient History     Smoking Tobacco Use: Former     Smokeless Tobacco Use: Never     Passive Exposure: Not on file   Housing/Utilities: Low Risk  (05/16/2022)    Housing/Utilities     Within the past 12 months, have you ever stayed: outside, in a car, in a tent, in an overnight shelter, or temporarily in someone else's home (i.e. couch-surfing)?: No     Are you worried about losing your housing?: No     Within the past 12 months, have you been unable to get utilities (heat, electricity) when it was really needed?: No   Alcohol Use: Not At Risk (06/12/2022)    Alcohol Use     How often do you have a drink containing alcohol?: Never     How many drinks containing alcohol do you have on a typical day when you are drinking?: Not on file     How often do you have 5 or more drinks on one occasion?: Never   Transportation Needs: No Transportation Needs (06/06/2022)    Received from [facility], [facility]    [facility] - Transportation     In the past 12 months, has lack of transportation kept you from medical appointments or from getting medications?: No     Lack of Transportation (Non-Medical): No   Substance Use: Low Risk  (06/12/2022)    Substance Use     Taken prescription drugs for non-medical reasons: Never     Taken illegal drugs: Never     Patient indicated they have taken drugs in the past year for non-medical reasons: Yes, [positive answer(s)]: Not on file   Health Literacy: Low Risk  (08/07/2019)    Health Literacy     : Never   Physical Activity: Not on file   Interpersonal Safety:  Unknown (10/22/2022)    Interpersonal Safety     Unsafe Where You Currently Live: Not on file     Physically Hurt by Anyone: Not on file     Abused by Anyone: Not on file   Stress: Not on file   Intimate Partner Violence: Not on file   Depression: Not at risk (11/02/2020)    PHQ-2     PHQ-2 Score: 0   Social Connections: Not on file       Complex Discharge Information    Is patient identified as a difficult/complex discharge?: No  Discharge Needs Assessment  Concerns to be Addressed: discharge planning    Clinical Risk Factors: New Diagnosis, Principal Diagnosis: Cancer, Stroke, COPD, Heart Failure, AMI, Pneumonia, Joint Replacment, Multiple Diagnoses (Chronic), Functional Limitations    Barriers to taking medications: No    Prior overnight hospital stay or ED visit in last 90 days: No  Equipment Needed After Discharge: none    Discharge Facility/Level of Care Needs:      Readmission  Risk of Unplanned Readmission Score: UNPLANNED READMISSION SCORE: 15%  Predictive Model Details          15% (Medium)  Factor Value    Calculated 10/22/2022 08:02 24% Number of active inpatient medication orders 29    [place] Risk of Unplanned Readmission Model 11% ECG/EKG order present in last 6 months 9% Latest BUN high (32 mg/dL)     8% Imaging order present in last 6 months     7% Latest hemoglobin low (10.8 g/dL)     7% Phosphorous result present     7% Number of ED visits in last six months 1     6% Age 51     5% Active anticoagulant inpatient medication order present     5% Active corticosteroid inpatient medication order present     5% Latest creatinine high (1.33 mg/dL)     4% Charlson Comorbidity Index 3     2% Future appointment scheduled     1% Current length of stay 0.739 days      Readmitted Within the Last 30 Days? (No if blank)   Patient at risk for readmission?: No    Discharge Plan  Screen findings are: Discharge planning needs identified or anticipated (Comment). (Wants Home Aid)    Expected Discharge Date: TBD  Expected Transfer from Critical Care:  TBD    Quality data for continuing care services shared with patient and/or representative?: Yes  Patient and/or family were provided with choice of facilities / services that are available and appropriate to meet post hospital care needs?: Yes     Initial Assessment complete?: Yes  October 22, 2022 10:17 AM

## 2022-10-22 NOTE — Unmapped (Signed)
Problem: Suicide Risk  Goal: Absence of Self-Harm  Outcome: Resolved

## 2022-10-22 NOTE — Unmapped (Signed)
Pt was admitted as ICU-level status for increasing oxygen demand. Currently on 10L via [facility]NC. Pt occasionally desats to low 80s upon exertion, VSS otherwise. Plan of care to continue.   Problem: Suicide Risk  Goal: Absence of Self-Harm  Intervention: Promote Psychosocial Wellbeing  Recent Flowsheet Documentation  Taken 10/21/2022 1649 by Gardiner Sleeper, RN  Sleep/Rest Enhancement:   awakenings minimized   consistent schedule promoted   noise level reduced   regular sleep/rest pattern promoted   relaxation techniques promoted   room darkened     Problem: Adult Inpatient Plan of Care  Goal: Absence of Hospital-Acquired Illness or Injury  Intervention: Identify and Manage Fall Risk  Recent Flowsheet Documentation  Taken 10/21/2022 1649 by Gardiner Sleeper, RN  Safety Interventions:   fall reduction program maintained   low bed   lighting adjusted for tasks/safety   aspiration precautions   bleeding precautions   infection management   nonskid shoes/slippers when out of bed   commode/urinal/bedpan at bedside  Intervention: Prevent Skin Injury  Recent Flowsheet Documentation  Taken 10/21/2022 1800 by Gardiner Sleeper, RN  Positioning for Skin: Left  Taken 10/21/2022 1649 by Gardiner Sleeper, RN  Positioning for Skin: Supine/Back  Device Skin Pressure Protection:   absorbent pad utilized/changed   adhesive use limited   positioning supports utilized   skin-to-device areas padded  Skin Protection:   adhesive use limited   cleansing with dimethicone incontinence wipes   incontinence pads utilized   skin-to-device areas padded   transparent dressing maintained   tubing/devices free from skin contact  Taken 10/21/2022 1630 by Gardiner Sleeper, RN  Positioning for Skin: Supine/Back  Intervention: Prevent Infection  Recent Flowsheet Documentation  Taken 10/21/2022 1649 by Gardiner Sleeper, RN  Infection Prevention:   cohorting utilized   hand hygiene promoted   rest/sleep promoted   single patient room provided   equipment surfaces disinfected     Problem: Fall Injury Risk  Goal: Absence of Fall and Fall-Related Injury  Intervention: Promote Injury-Free Environment  Recent Flowsheet Documentation  Taken 10/21/2022 1649 by Gardiner Sleeper, RN  Safety Interventions:   fall reduction program maintained   low bed   lighting adjusted for tasks/safety   aspiration precautions   bleeding precautions   infection management   nonskid shoes/slippers when out of bed   commode/urinal/bedpan at bedside     Problem: Breathing Pattern Ineffective  Goal: Effective Breathing Pattern  Intervention: Promote Improved Breathing Pattern  Recent Flowsheet Documentation  Taken 10/21/2022 1800 by Gardiner Sleeper, RN  Head of Bed [facility]) Positioning: HOB at 30-45 degrees  Taken 10/21/2022 1649 by Gardiner Sleeper, RN  Head of Bed [facility]) Positioning: HOB at 30-45 degrees     Problem: Gas Exchange Impaired  Goal: Optimal Gas Exchange  Intervention: Optimize Oxygenation and Ventilation  Recent Flowsheet Documentation  Taken 10/21/2022 1800 by Gardiner Sleeper, RN  Head of Bed [facility]) Positioning: HOB at 30-45 degrees  Taken 10/21/2022 1649 by Gardiner Sleeper, RN  Head of Bed [facility]) Positioning: HOB at 30-45 degrees

## 2022-10-22 NOTE — Unmapped (Signed)
Problem: Adult Inpatient Plan of Care  Goal: Plan of Care Review  10/22/2022 0451 by Johnsie Cancel, RN  Outcome: Progressing  10/22/2022 0440 by Johnsie Cancel, RN  Outcome: Progressing  Goal: Patient-Specific Goal (Individualized)  10/22/2022 0451 by Johnsie Cancel, RN  Outcome: Progressing  10/22/2022 0440 by Johnsie Cancel, RN  Outcome: Progressing  Goal: Absence of Hospital-Acquired Illness or Injury  10/22/2022 0451 by Johnsie Cancel, RN  Outcome: Progressing  10/22/2022 0440 by Johnsie Cancel, RN  Outcome: Progressing  Intervention: Identify and Manage Fall Risk  Recent Flowsheet Documentation  Taken 10/21/2022 2000 by Johnsie Cancel, RN  Safety Interventions:   aspiration precautions   bleeding precautions   commode/urinal/bedpan at bedside   fall reduction program maintained   lighting adjusted for tasks/safety   nonskid shoes/slippers when out of bed  Intervention: Prevent Skin Injury  Recent Flowsheet Documentation  Taken 10/21/2022 2000 by Johnsie Cancel, RN  Device Skin Pressure Protection:   absorbent pad utilized/changed   positioning supports utilized  Skin Protection:   cleansing with dimethicone incontinence wipes   incontinence pads utilized   tubing/devices free from skin contact  Intervention: Prevent Infection  Recent Flowsheet Documentation  Taken 10/21/2022 2000 by Johnsie Cancel, RN  Infection Prevention:   hand hygiene promoted   personal protective equipment utilized   rest/sleep promoted   single patient room provided  Goal: Optimal Comfort and Wellbeing  10/22/2022 0451 by Johnsie Cancel, RN  Outcome: Progressing  10/22/2022 0440 by Johnsie Cancel, RN  Outcome: Progressing  Goal: Readiness for Transition of Care  10/22/2022 0451 by Johnsie Cancel, RN  Outcome: Progressing  10/22/2022 0440 by Johnsie Cancel, RN  Outcome: Progressing  Goal: Rounds/Family Conference  10/22/2022 0451 by Johnsie Cancel, RN  Outcome: Progressing  10/22/2022 0440 by Johnsie Cancel, RN  Outcome: Progressing     Problem: Fall Injury Risk  Goal: Absence of Fall and Fall-Related Injury  10/22/2022 0451 by Johnsie Cancel, RN  Outcome: Progressing  10/22/2022 0440 by Johnsie Cancel, RN  Outcome: Progressing  Intervention: Promote Injury-Free Environment  Recent Flowsheet Documentation  Taken 10/21/2022 2000 by Johnsie Cancel, RN  Safety Interventions:   aspiration precautions   bleeding precautions   commode/urinal/bedpan at bedside   fall reduction program maintained   lighting adjusted for tasks/safety   nonskid shoes/slippers when out of bed     Problem: Breathing Pattern Ineffective  Goal: Effective Breathing Pattern  10/22/2022 0451 by Johnsie Cancel, RN  Outcome: Progressing  10/22/2022 0440 by Johnsie Cancel, RN  Outcome: Progressing  Intervention: Promote Improved Breathing Pattern  Recent Flowsheet Documentation  Taken 10/21/2022 2000 by Johnsie Cancel, RN  Head of Bed [facility]) Positioning: HOB at 30 degrees     Problem: Gas Exchange Impaired  Goal: Optimal Gas Exchange  10/22/2022 0451 by Johnsie Cancel, RN  Outcome: Progressing  10/22/2022 0440 by Johnsie Cancel, RN  Outcome: Progressing  Intervention: Optimize Oxygenation and Ventilation  Recent Flowsheet Documentation  Taken 10/21/2022 2000 by Johnsie Cancel, RN  Head of Bed [facility]) Positioning: HOB at 30 degrees     Problem: Self-Care Deficit  Goal: Improved Ability to Complete Activities of Daily Living  10/22/2022 0451 by Johnsie Cancel, RN  Outcome: Progressing  10/22/2022 0440 by Johnsie Cancel, RN  Outcome: Progressing     Problem: Comorbidity Management  Goal: Maintenance of COPD Symptom Control  Outcome: Progressing  Goal: Blood Glucose Levels Within Targeted Range  Outcome: Progressing  Goal: Blood Pressure in Desired Range  Outcome: Progressing

## 2022-10-22 NOTE — Unmapped (Signed)
Patient is placed on large bore nasal canula with 10L flow for desaturation. Breathing treatments given. Desaturates quickly with activities

## 2022-10-22 NOTE — Unmapped (Signed)
[facility] Hospitalist Daily Progress Note     LOS: 1 day     Assessment/Plan:  Principal Problem:    Acute on chronic hypoxic respiratory failure (CMS-HCC)  Active Problems:    Pulmonary sarcoidosis (CMS-HCC)    HTN (hypertension)    Type 2 diabetes mellitus with hyperglycemia (CMS-HCC)    Bronchiectasis type 1 (CMS-HCC)    Chronic pulmonary aspergillosis (CMS-HCC)    Severe persistent asthma dependent on systemic steroids    Pulmonary hypertension (CMS-HCC)  Resolved Problems:    * No resolved hospital problems. *         Deniece Ree is a 57 y.o. female who is presenting to [facility] with Acute on chronic hypoxic respiratory failure (CMS-HCC), in the setting of the following pertinent/contributing co-morbidities: Pulmonary HTN, Sarcoidosis, Chronic pulmonary aspergillosis .        Acute on chronic hypoxic respiratory failure (CMS-HCC) likely due to fluid overload  Presenting with 3 week progressively worsening DOE, after being treated for pneumonia about one month. Uses 6L  at rest with increasing requirement up to 8-10L with exertion since she became worse. Also reported desaturations to the 70s. Pink sputum production has increased in volume over the past week. CTA chest demonstrated no PE with unchanged fibrotic pulmonary sarcoidosis and upper lobe bronchiectasis that was also unchanged.Appears to be closer to euvolemic on exam this morning.   - O2 therapy, baseline 6L at rest, goal O2>90%  - Lasix 40mg  po BID   - Prednisone 40mg  daily for 4 days  - Pulmonary Medicine following   - Sputum and AFB culture   - BNP q72hr  - HTS 3% / Albuterol / Aerobika QID   - Augmentin BID      Pulmonary HTN  Thought to be due to group 3 vs group 5. Most recent RHC 10/23 with RA 5, wedge 17, PA 71/30, mean 45. She reports compliance with revatio 20mg  TID and lasix 40mg  BID. 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 Reports that she stopped taking all inhalers except albuterol due to self reported risk of pneumonia associated with them.   - Duonebs q6h and prednisone as above  - Pulmonary Medicine consult      DMII on Insulin  Last A1c 9 2/4.  On glargine 18U at bedtime and sliding scale novolog QID and HS. Poor glycemic control while on steroids.   - Glargine increase from 18U to 22 units    - SSI ISF 20, goal 140.   - Accuchecks AC/HS,     CKD stage 3  - Baseline creatinine around 1.2, acceptable at 1.3 this morning.        DVT prophylaxis. Lovenox      Disposition: floor.      Please page the [facility]) pager at [phone] with questions.      Pending labs:   Pending Labs       Order Current Status    Blood Culture #1 In process    Blood Culture #2 In process            Subjective:   Breathing improved this morning.  Notes less pink  sputum production this morning as well.     Objective:   Physical Exam:    GEN: NAD, sitting up in bed.    RESPIRATORY: Breathing pattern was normal and the chest moved symmetrically.  Lung sounds were diminished and there were no adventitious sounds.    CARDIOVASCULAR: Heart rate and rhythm were normal.  S1 and S2 were normal and there was a 2/6 systolic murmurs.   ABDOMEN: The abdomen was normal in contour.  Bowel sounds were present. Palpation detected no tenderness.  GENITOURINARY - foley absent.    NEUROLOGIC: Mental status was normal.  The patient was normally coordinated in the arms.   PSYCHIATRIC: The patient was oriented to person, place, time, and circumstance. Speech was normal. Mood and affect were normal     Vital signs in last 24 hours:  Temp:  [36.6 ??C (97.9 ??F)-37.3 ??C (99.1 ??F)] 37.1 ??C (98.7 ??F)  Heart Rate: [72-100] 72  SpO2 Pulse:  [72-95] 72  Resp:  [17-45] 35  BP: (110-137)/(62-85) 110/71  MAP (mmHg):  [83-101] 83  SpO2:  [92 %-100 %] 100 %  BMI (Calculated):  [26.52] 26.52    Intake/Output last 24 hours:    Intake/Output Summary (Last 24 hours) at 10/22/2022 0708  Last data filed at 10/22/2022 0030  Gross per 24 hour   Intake 1310 ml   Output 1500 ml   Net -190 ml       Medications:   Scheduled Meds:   amlodipine  5 mg Oral Daily    enoxaparin (LOVENOX) injection  40 mg Subcutaneous Q24H    furosemide  40 mg Intravenous BID    gabapentin  300 mg Oral TID    insulin glargine  18 Units Subcutaneous Nightly    insulin lispro  0-20 Units Subcutaneous ACHS    ipratropium-albuterol  3 mL Nebulization Q6H (RT)    posaconazole  200 mg Oral Daily    predniSONE  40 mg Oral Daily    sildenafiL (pulm.hypertension)  20 mg Oral TID     Continuous Infusions:    D Linford Arnold, MD MD  [facility] Hospitalist Service.

## 2022-10-22 NOTE — Unmapped (Signed)
PULMONARY CONSULT  NOTE      Patient: Karen Barber(Jun 23, 1965)  Reason for consultation: [name] is a 57 y.o. female who is seen in consultation at the request of Karen Robinson, MD for comprehensive evaluation of acute on chronic hypoxic respiratory failure.    Assessment and Recommendations:      Principal Problem:    Acute on chronic hypoxic respiratory failure (CMS-HCC)  Active Problems:    Pulmonary sarcoidosis (CMS-HCC)    HTN (hypertension)    Type 2 diabetes mellitus with hyperglycemia (CMS-HCC)    Bronchiectasis type 1 (CMS-HCC)    Chronic pulmonary aspergillosis (CMS-HCC)    Severe persistent asthma dependent on systemic steroids    Pulmonary hypertension (CMS-HCC)  Resolved Problems:    * No resolved hospital problems. *    [name] is a 57 y.o. 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 Given her improvement with lasix and steroids yesterday, I suspect that some degree of her worsening was related to volume overload and her obstructive lung disease. She does appear to be closer to euvolemia, though she is likely diuresing in part to her hyperglycemia. Given the sputum production, her partial response to a short course of antibiotics a few weeks ago and her bronchiectasis burden, I would recommend treating her empirically for an exacerbation with airway clearance and antibiotics as well.    Recommendations:  - Agree with starting home diuretic regimen, would recommend targeting even to slightly negative fluid balance over the next 24 hours  - Recommend starting Augmentin x 14 days for bronchiectasis exacerbation as she has grown OP flora in the past  - Repeat lower respiratory bacterial culture and AFB culture (as she has grown NTM in the past)  - Recommend aggressive airway clearance with pre-treatment albuterol followed by 3% hypertonic saline and an [place] device QID (would hold HTS if she develops any bronchospasm or hemoptysis)  - Okay to continue prednisone 40 mg x 5 days total, but low threshold to stop if her hyperglycemia cannot be well-controlled  - Recommend repeating echocardiogram as she is likely close to euvolemia to evaluate for worsening pHTN given her increased oxygen requirement  - She states that she would like to re-establish care with [facility] (followed with [place] last in 2021 and more recently was seen at [facility]); when she is close to discharge, we can help coordinate her outpatient care  - Continue posaconazole for her invasive aspergillosis treatment      We appreciate the opportunity to assist in the care of this patient.  Please page [phone] with any questions. We will continue to follow.    Verne Grain, MD    Attestation      I directly provided consultative services as documented in this note and I personally spent 40 minutes face-to-face and non face-to-face in the care of this patient, which includes all pre, intra and post visit time on the date of service including examining the patient, evaluating/interpreting objective clinical data, coordinating care with the entire multidisciplinary care team and developing a comprehensive management plan as outlined above. All documented time was specific to the E/M visit and does not include any procedures that may have been performed.  Verne Grain, MD      Subjective:      History of Present Illness:  [name] is a 57 y.o. 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     Per chart review, she reports that she had increased dyspnea from her baseline following outpatient treatment for pneumonia (Augmentin and azithromycin prescribed by her PCP. She initially improved for 1-2 weeks, but then she began to feel the way she did prior to the antibiotic course. The dyspnea on exertion, cough and sputum became progressively worse over the few days prior to admission and she has needed to turn her oxygen up at home. She reports that the cough has been productive of thicker globs of pink sputum without clots.  She was hearing herself wheeze regularly and has had chest tightness but no chest pain. She denies any fevers or chills. She has been using her home nebulizer treatments with transient improvement in the wheeze and chest tightness. She has had increased lower extremity edema in the past few days. She has not weighed herself in the past few days.  She was diuresed and started on systemic steroids at admission. This morning, she reports feeling better in terms of her wheezing and her sputum production has decreased in quantity. She reports her legs look less swollen but still feel tight. She reports she did do regular airway clearance a few years ago, but has not recently and isn't sure why.     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 A.niger from sputum 08/2016-09/2016.    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    Sarcoidosis 1995    Type 2 diabetes mellitus with hyperglycemia (CMS-HCC) 07/27/2014    Visual impairment     glasses     Past Surgical History:   Procedure Laterality Date    LUNG LOBECTOMY      PR AMPUTATION TOE,MT-P JT Right 12/12/2019    Procedure: Right foot fifth toe amputation at the metatarsophalangeal joint level;  Surgeon: Britt Bottom, DPM;  Location: MAIN OR [facility];  Service: Vascular    PR AMPUTATION TOE,MT-P JT Right 06/16/2020    Procedure: Right foot amputation of 3rd toe at mpj level;  Surgeon: Britt Bottom, DPM;  Location: MAIN OR [facility];  Service: Vascular    PR RIGHT HEART CATH O2 SATURATION & CARDIAC OUTPUT N/A 04/29/2019    Procedure: Right  Heart Catheterization;  Surgeon: Rosana Hoes, MD;  Location: [facility] C[facility];  Service: Cardiology    PR THORACOSCOPY SURG TOT PULM DECORT Left 12/14/2014    Procedure: THORACOSCOPY SURG; W/TOT PULM DECORTIC/PNEUMOLYS;  Surgeon: Evert Kohl, MD;  Location: MAIN OR [facility];  Service: Cardiothoracic    PR THORACOSCOPY W/THERA WEDGE RESEXN INITIAL UNILAT Left 12/03/2014    Procedure: THORACOSCOPY, SURGICAL; WITH THERAPEUTIC WEDGE RESECTION (EG, MASS, NODULE) INITIAL UNILATERAL;  Surgeon: Evert Kohl, MD;  Location: MAIN OR [facility];  Service: Cardiothoracic     Medications reviewed in Epic  Allergies as of 10/21/2022    (No Known Allergies)     Family History   Problem Relation Age of Onset    Dementia Mother     Diabetes Father     Diabetes Brother     Diabetes Maternal Aunt     Sarcoidosis Maternal Aunt     Diabetes Maternal Uncle     Diabetes Paternal Aunt     Diabetes Paternal Uncle      Social History     Tobacco Use    Smoking status: Former     Current packs/day: 0.00 Average packs/day: 0.5 packs/day for 9.0 years (4.5 ttl pk-yrs)     Types: Cigarettes     Start date: 05/02/1978     Quit date: 05/03/1987     Years since quitting: 35.4    Smokeless tobacco: Never   Substance Use Topics    Alcohol use: No     Alcohol/week: 0.0 standard drinks of alcohol        Objective:      Physical Exam:  Vitals:    10/22/22 0800 10/22/22 0830 10/22/22 1000 10/22/22 1020   BP: 156/88  122/64    Pulse: 85 77 84 87   Resp: (!) 40 (!) 38 (!) 40 27   Temp:       TempSrc:       SpO2: 100% 100% 92% (!) 88%   Weight:       Height:         General: Alert, well-appearing, and in no distress.  Eyes: Anicteric sclera, conjunctiva clear.  ENT:  Mucous membranes moist and intact.  Lungs: Normal excursion. No wheezes or crackles. Diminished breath sounds.  Cardiovascular: Regular rate and rhythm, S1, S2 normal, no murmur, click, rub or gallop appreciated.  Abdomen: Soft, non-tender, not distended  Musculoskeletal: No clubbing and no synovitis. No edema.  Skin: No rashes or lesions.  Neuro: No focal neurological deficits.    Malnutrition Assessment by RD:          Diagnostic Review:   All labs and images were personally reviewed.    Echo (01/2022): Normal LV systolic function with moderate LVH. Mild RV systolic dysfunction. Trivial MR, trivial PR, trivial TR. Peak TR jet 3.7 m/s (RVSP estimated 57 mmHg).    CT Chest (10/21/2022): No PE. Stable appearance of severe fibrotic disease with upper lobe volume loss (2/2 lobectomy and cystic bronchiectasis). Bulky mediastinal and hilar LAD, stable.     BNP 37.21

## 2022-10-23 LAB — BASIC METABOLIC PANEL
ANION GAP: 4 mmol/L — ABNORMAL LOW (ref 5–14)
BLOOD UREA NITROGEN: 33 mg/dL — ABNORMAL HIGH (ref 9–23)
BUN / CREAT RATIO: 27
CALCIUM: 10 mg/dL (ref 8.7–10.4)
CHLORIDE: 104 mmol/L (ref 98–107)
CO2: 32.1 mmol/L — ABNORMAL HIGH (ref 20.0–31.0)
CREATININE: 1.24 mg/dL — ABNORMAL HIGH
EGFR CKD-EPI (2021) FEMALE: 50 mL/min/{1.73_m2} — ABNORMAL LOW (ref >=60)
GLUCOSE RANDOM: 229 mg/dL — ABNORMAL HIGH (ref 70–179)
POTASSIUM: 5.2 mmol/L — ABNORMAL HIGH (ref 3.4–4.8)
SODIUM: 140 mmol/L (ref 135–145)

## 2022-10-23 LAB — MAGNESIUM: MAGNESIUM: 1.9 mg/dL (ref 1.6–2.6)

## 2022-10-23 LAB — B-TYPE NATRIURETIC PEPTIDE: B-TYPE NATRIURETIC PEPTIDE: 36.15 pg/mL (ref <=100)

## 2022-10-23 MED ADMIN — furosemide (LASIX) tablet 40 mg: 40 mg | ORAL | @ 17:00:00

## 2022-10-23 MED ADMIN — amoxicillin-clavulanate (AUGMENTIN) 875-125 mg per tablet 1 tablet: 875 mg | ORAL | @ 01:00:00 | Stop: 2022-11-05

## 2022-10-23 MED ADMIN — gabapentin (NEURONTIN) capsule 300 mg: 300 mg | ORAL | @ 17:00:00

## 2022-10-23 MED ADMIN — gabapentin (NEURONTIN) capsule 300 mg: 300 mg | ORAL | @ 01:00:00

## 2022-10-23 MED ADMIN — insulin lispro (HumaLOG) injection 0-20 Units: 0-20 [IU] | SUBCUTANEOUS | @ 22:00:00

## 2022-10-23 MED ADMIN — ipratropium-albuterol (DUO-NEB) 0.5-2.5 mg/3 mL nebulizer solution 3 mL: 3 mL | RESPIRATORY_TRACT | @ 08:00:00

## 2022-10-23 MED ADMIN — gabapentin (NEURONTIN) capsule 300 mg: 300 mg | ORAL | @ 12:00:00

## 2022-10-23 MED ADMIN — sodium chloride 3 % NEBULIZER solution 4 mL: 4 mL | RESPIRATORY_TRACT | @ 02:00:00

## 2022-10-23 MED ADMIN — ipratropium-albuterol (DUO-NEB) 0.5-2.5 mg/3 mL nebulizer solution 3 mL: 3 mL | RESPIRATORY_TRACT | @ 14:00:00

## 2022-10-23 MED ADMIN — posaconazole (NOXAFIL) delayed released tablet 200 mg: 200 mg | ORAL | @ 13:00:00

## 2022-10-23 MED ADMIN — ipratropium-albuterol (DUO-NEB) 0.5-2.5 mg/3 mL nebulizer solution 3 mL: 3 mL | RESPIRATORY_TRACT | @ 19:00:00

## 2022-10-23 MED ADMIN — furosemide (LASIX) tablet 40 mg: 40 mg | ORAL | @ 10:00:00

## 2022-10-23 MED ADMIN — insulin lispro (HumaLOG) injection 0-20 Units: 0-20 [IU] | SUBCUTANEOUS | @ 01:00:00

## 2022-10-23 MED ADMIN — amlodipine (NORVASC) tablet 5 mg: 5 mg | ORAL | @ 12:00:00

## 2022-10-23 MED ADMIN — ipratropium-albuterol (DUO-NEB) 0.5-2.5 mg/3 mL nebulizer solution 3 mL: 3 mL | RESPIRATORY_TRACT | @ 02:00:00

## 2022-10-23 MED ADMIN — sildenafiL (pulm.hypertension) (REVATIO) tablet 20 mg: 20 mg | ORAL | @ 01:00:00

## 2022-10-23 MED ADMIN — predniSONE (DELTASONE) tablet 40 mg: 40 mg | ORAL | @ 12:00:00 | Stop: 2022-10-23

## 2022-10-23 MED ADMIN — insulin lispro (HumaLOG) injection 0-20 Units: 0-20 [IU] | SUBCUTANEOUS | @ 12:00:00

## 2022-10-23 MED ADMIN — amoxicillin-clavulanate (AUGMENTIN) 875-125 mg per tablet 1 tablet: 875 mg | ORAL | @ 12:00:00 | Stop: 2022-11-05

## 2022-10-23 MED ADMIN — sildenafiL (pulm.hypertension) (REVATIO) tablet 20 mg: 20 mg | ORAL | @ 12:00:00

## 2022-10-23 MED ADMIN — insulin lispro (HumaLOG) injection 0-20 Units: 0-20 [IU] | SUBCUTANEOUS | @ 16:00:00

## 2022-10-23 MED ADMIN — insulin lispro (HumaLOG) injection 3 Units: 3 [IU] | SUBCUTANEOUS | @ 22:00:00 | Stop: 2022-10-23

## 2022-10-23 MED ADMIN — insulin glargine (LANTUS) injection 22 Units: 22 [IU] | SUBCUTANEOUS | @ 01:00:00

## 2022-10-23 MED ADMIN — sildenafiL (pulm.hypertension) (REVATIO) tablet 20 mg: 20 mg | ORAL | @ 17:00:00

## 2022-10-23 MED ADMIN — enoxaparin (LOVENOX) syringe 40 mg: 40 mg | SUBCUTANEOUS | @ 01:00:00

## 2022-10-23 NOTE — Unmapped (Signed)
[facility] Hospitalist Daily Progress Note     LOS: 2 days     Assessment/Plan:  Principal Problem:    Acute on chronic hypoxic respiratory failure (CMS-HCC)  Active Problems:    Pulmonary sarcoidosis (CMS-HCC)    HTN (hypertension)    Type 2 diabetes mellitus with hyperglycemia (CMS-HCC)    Bronchiectasis type 1 (CMS-HCC)    Chronic pulmonary aspergillosis (CMS-HCC)    Severe persistent asthma dependent on systemic steroids    Pulmonary hypertension (CMS-HCC)  Resolved Problems:    * No resolved hospital problems. *         Karen Barber is a 57 y.o. female who is presenting to [facility] with Acute on chronic hypoxic respiratory failure (CMS-HCC), in the setting of the following pertinent/contributing co-morbidities: Pulmonary HTN, Sarcoidosis, Chronic pulmonary aspergillosis .        Acute on chronic hypoxic respiratory failure (CMS-HCC) likely due to worsening pulmonary HTN and likely bronchiectasis flare  Presenting with 3 week progressively worsening DOE, after being treated for pneumonia about one month. Uses 5 to 6L [place] at rest with increasing requirement up to 8-10L with exertion since she became worse. Also reported desaturations to the 70s. Pink sputum production has increased in volume over the past week. CTA chest demonstrated no PE with unchanged fibrotic pulmonary sarcoidosis and upper lobe bronchiectasis that was also unchanged.  She is now euvolemic.    - O2 therapy, baseline 6L at rest but is usually 5-8 liters depending on exertional level.   SPO2 goal 88% or higher.    - Lasix 40mg  po BID - appears euvolemic now and bnp normal.  Renal function stable.    - Prednisone has been stopped due to unclear benefit in this setting and poorly-controlled diabetes.    - Pulmonary Medicine following   - Sputum and AFB culture pending   - can stop trending bnp unless clinically worsens  - HTS 3% stopped given intermittent blood in sputum and increase in hemoptysis risk given her advanced sarcoidosis/fibrosis after discussing this with Pulmonology.    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    Thought to be due to group 2 (has had an elevated wedge on prior RHC) vs group 5. Most recent RHC 10/23 with RA 5, wedge 17, PA 71/30, mean 45. She reports compliance with revatio 20mg  TID and lasix 40mg  BID. Net negative 1.4 liters and renal function is stable.  Continue with current plan, pulmonology following.  Needs long-term outpatient follow-up to discuss next steps with regards to [facility] management (?adempas vs veletri vs ambrisentan, etc).  Long-term prognosis is very poor given not a transplant candidate apparently due to anatomy.   Continuing revatio.  BP has been stable.  Follow-up TTE results (performed today).    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 Reports that she stopped taking all inhalers except albuterol due to self reported risk of pneumonia associated with them.   - aerobika BID, stopped hypertonic saline, continuing duonebs q6 hours.    - Pulmonary Medicine consult  DMII on Insulin, uncontrolled, exacerbated by steroid-induced hyperglycemia.   Last A1c 8.3 on 09/25/2022.  On glargine 18U at bedtime and sliding scale novolog QID and HS. Poor glycemic control while on steroids. Got a dose of prednisone today so her sugars especially after meals will be elevated today -- no intervention necessary unless develops severe abdominal pain, nausea, vomiting, or mental status changes.  If these things occur, will need a stat BMP to rule out DKA.       CKD stage 3  - Baseline creatinine around 1.2, acceptable at 1.24 now.         DVT prophylaxis. Lovenox      Disposition: can transfer to step down, orders placed.  Anticipate home in next 48 hours to see what her long-term home O2 requirements will be.      Please page the [facility]) pager at [phone] with questions.      Pending labs:   Pending Labs       Order Current Status    Blood Culture #1 Preliminary result    Blood Culture #2 Preliminary result            Subjective:   Overall the patient states she feels better than when she came in.  Says lower extremity swelling bilaterally is much better.  States her breathing is at its worse whenever she tries to move.  Not having frank hemoptysis but states occasional blood-tinged sputum.      Objective:   Physical Exam:    GEN: NAD, sitting up in bed.    RESPIRATORY: Breathing pattern was normal and the chest moved symmetrically.  Lung sounds were diminished and there were no adventitious sounds.    CARDIOVASCULAR: Heart rate and rhythm were normal.  S1 and S2 were normal and there was a 2/6 systolic murmurs.         Vital signs in last 24 hours:  Temp:  [36.6 ??C (97.9 ??F)-37 ??C (98.6 ??F)] 36.7 ??C (98.1 ??F)  Heart Rate:  [70-102] 76  SpO2 Pulse:  [70-101] 76  Resp:  [15-45] 24  BP: (111-139)/(62-77) 128/75  MAP (mmHg):  [80-93] 91  SpO2:  [83 %-100 %] 98 %    Intake/Output last 24 hours:    Intake/Output Summary (Last 24 hours) at 10/23/2022 1302  Last data filed at 10/23/2022 1200  Gross per 24 hour   Intake 1320 ml   Output 3100 ml   Net -1780 ml       Medications:   Scheduled Meds:   amlodipine  5 mg Oral Daily    amoxicillin-clavulanate  875 mg Oral Q12H SCH    enoxaparin (LOVENOX) injection  40 mg Subcutaneous Q24H    furosemide  40 mg Oral BID    gabapentin  300 mg Oral TID    insulin glargine  22 Units Subcutaneous Nightly    insulin lispro  0-20 Units Subcutaneous ACHS    ipratropium-albuterol  3 mL Nebulization Q6H (RT)    posaconazole  200 mg Oral Daily    sildenafiL (pulm.hypertension)  20 mg Oral TID     Continuous Infusions:    Edd Fabian, DO MD  [facility] Hospitalist Service.

## 2022-10-23 NOTE — Unmapped (Signed)
Patient remains on large bore nasal canula with 6 to 8 lpm flow. Breathing treatments given as ordered

## 2022-10-23 NOTE — Unmapped (Addendum)
Pt downgraded to stepdown status this afternoon.   Still currently on 6L via [facility] with an oxygen goal of 88%. Blood sugars still elevated, but much more controlled than yesterday (highest today was 290s). She expressed a desire to be more active today, and was able to ambulate to the bathroom today and had a BM.   Still being diuresed with PO Lasix.   Pt verbalized understanding of importance of carb-controlled diet, and stated she hopes to go home by the end of the week.  Family is at the bedside. Plan of care to continue.   Problem: Adult Inpatient Plan of Care  Goal: Plan of Care Review  Outcome: Progressing  Goal: Patient-Specific Goal (Individualized)  Outcome: Progressing  Goal: Absence of Hospital-Acquired Illness or Injury  Outcome: Progressing  Intervention: Identify and Manage Fall Risk  Recent Flowsheet Documentation  Taken 10/23/2022 0800 by Gardiner Sleeper, RN  Safety Interventions:   fall reduction program maintained   low bed   lighting adjusted for tasks/safety   aspiration precautions   bleeding precautions   infection management   nonskid shoes/slippers when out of bed   commode/urinal/bedpan at bedside  Intervention: Prevent Skin Injury  Recent Flowsheet Documentation  Taken 10/23/2022 1200 by Gardiner Sleeper, RN  Positioning for Skin: Sitting in Chair  Device Skin Pressure Protection: absorbent pad utilized/changed  Skin Protection: adhesive use limited  Taken 10/23/2022 1000 by Gardiner Sleeper, RN  Positioning for Skin: Right  Device Skin Pressure Protection: absorbent pad utilized/changed  Skin Protection:   incontinence pads utilized   adhesive use limited  Taken 10/23/2022 0800 by Gardiner Sleeper, RN  Positioning for Skin: Left  Device Skin Pressure Protection:   absorbent pad utilized/changed   adhesive use limited   positioning supports utilized   skin-to-device areas padded  Skin Protection:   adhesive use limited   cleansing with dimethicone incontinence wipes   incontinence pads utilized   skin-to-device areas padded   transparent dressing maintained   tubing/devices free from skin contact  Taken 10/23/2022 0745 by Gardiner Sleeper, RN  Positioning for Skin: Supine/Back  Intervention: Prevent Infection  Recent Flowsheet Documentation  Taken 10/23/2022 0800 by Gardiner Sleeper, RN  Infection Prevention:   hand hygiene promoted   rest/sleep promoted   single patient room provided   equipment surfaces disinfected  Goal: Optimal Comfort and Wellbeing  Outcome: Progressing  Goal: Readiness for Transition of Care  Outcome: Progressing  Goal: Rounds/Family Conference  Outcome: Progressing

## 2022-10-23 NOTE — Unmapped (Signed)
Pt remains ICU status due to oxygen requirements. Currently still on 7L [facility]NC. Attempted to wean, but patient unable to tolerate and maintain sat goal of >90%. Vitals stable otherwise. Blood sugars have been elevated in the 400s today. MD aware, and 2 one-time doses of IV regular insulin given. Plan of care to continue.        Problem: Adult Inpatient Plan of Care  Goal: Plan of Care Review  Outcome: Ongoing - Unchanged  Goal: Patient-Specific Goal (Individualized)  Outcome: Ongoing - Unchanged  Goal: Absence of Hospital-Acquired Illness or Injury  Outcome: Ongoing - Unchanged  Intervention: Identify and Manage Fall Risk  Recent Flowsheet Documentation  Taken 10/22/2022 0800 by Gardiner Sleeper, RN  Safety Interventions:   bed alarm   fall reduction program maintained   low bed   lighting adjusted for tasks/safety   aspiration precautions   bleeding precautions   infection management   nonskid shoes/slippers when out of bed   commode/urinal/bedpan at bedside  Intervention: Prevent Skin Injury  Recent Flowsheet Documentation  Taken 10/22/2022 1600 by Gardiner Sleeper, RN  Positioning for Skin: Supine/Back  Device Skin Pressure Protection: positioning supports utilized  Taken 10/22/2022 1400 by Gardiner Sleeper, RN  Positioning for Skin: Left  Device Skin Pressure Protection: positioning supports utilized  Taken 10/22/2022 1200 by Gardiner Sleeper, RN  Positioning for Skin: Right  Device Skin Pressure Protection:   positioning supports utilized   tubing/devices free from skin contact  Taken 10/22/2022 0800 by Gardiner Sleeper, RN  Positioning for Skin: Left  Device Skin Pressure Protection:   absorbent pad utilized/changed   adhesive use limited   positioning supports utilized   skin-to-device areas padded  Skin Protection:   adhesive use limited   skin-to-device areas padded   transparent dressing maintained   tubing/devices free from skin contact  Intervention: Prevent Infection  Recent Flowsheet Documentation  Taken 10/22/2022 0800 by Gardiner Sleeper, RN  Infection Prevention:   cohorting utilized   hand hygiene promoted   rest/sleep promoted   single patient room provided   equipment surfaces disinfected  Goal: Optimal Comfort and Wellbeing  Outcome: Ongoing - Unchanged  Goal: Readiness for Transition of Care  Outcome: Ongoing - Unchanged  Goal: Rounds/Family Conference  Outcome: Ongoing - Unchanged

## 2022-10-23 NOTE — Unmapped (Signed)
Adult Nutrition Assessment Note    Visit Type: RN Consult  Reason for Visit: Per Admission Nutrition Screen (Adult)    HPI & PMH:   57 y.o. female who is presenting to [facility] with Acute on chronic hypoxic respiratory failure (CMS-HCC), in the setting of the following pertinent/contributing co-morbidities: Pulmonary HTN, Sarcoidosis, Chronic pulmonary aspergillosis .     Anthropometric Data:  Height: 180.3 cm (5' 11)   Admission weight: 83.9 kg (185 lb)  Last recorded weight: 84 kg (185 lb 3 oz)  IBW: 70.32 kg  Percent IBW: 122.58 %  BMI: Body mass index is 25.83 kg/m??.   Usual Body Weight:  198 lb one month prior per patient report    Weight history prior to admission:  13 lb/6.5% loss over one month per patient's reported UBW    Wt Readings from Last 10 Encounters:   10/22/22 84 kg (185 lb 3 oz)   09/25/22 83.9 kg (185 lb)   06/12/22 89.8 kg (198 lb)   05/16/22 89.4 kg (197 lb)   06/29/21 94.8 kg (208 lb 15.9 oz)   04/19/21 94.8 kg (209 lb)   11/02/20 98 kg (216 lb)   07/20/20 97.1 kg (214 lb)   06/08/20 97.1 kg (214 lb)   01/19/20 97.1 kg (214 lb)        Weight changes this admission:   Last 5 Recorded Weights    10/21/22 1032 10/21/22 1634 10/22/22 0715   Weight: 83.9 kg (185 lb) 86.2 kg (190 lb 0.6 oz) 84 kg (185 lb 3 oz)        Nutrition Focused Physical Exam:  Nutrition Focused Physical Exam:  Fat Areas Examined  Upper Arm: Mild loss      Muscle Areas Examined  Temple: No loss  Clavicle: No loss  Acromion: No loss  Scapular: No loss  Patellar: Mild loss  Anterior Thigh: Mild loss  Posterior Calf: Mild loss              Nutrition Evaluation  Overall Impressions: Mild fat loss, Mild muscle loss (10/23/22 1226)      NUTRITIONALLY RELEVANT DATA     Medications:   Nutritionally pertinent medications reviewed and evaluated for potential food and/or medication interactions.  Lasix, Humalog, Lantus, Prednisone     Labs:   Nutritionally pertinent labs reviewed and include K+: 5.2 mmol/L and Glucose: 229 mg/dL    Nutrition History:   October 23, 2022: Prior to admission:  Patient endorses loss of appetite over the past one month prior to admission. Patient reports feeling too fatigued to cook during this time along with losing appetite once food is ready to eat. Patient reports supplementing with Ensure Plus shakes 2-3x daily at home.    Allergies, Intolerances, Sensitivities, and/or Cultural/Religious Dietary Restrictions: none identified per chart review at this time       Current Nutrition:  Oral intake     Nutrition Orders            Supplement Adult; Ensure Plus High Protein (High Calorie/High Protein); # of Products PER Serving: 1 3xd Meals starting at 08/05 1200    Nutrition Therapy Consistent Carb; Consistent Carb 60/60/60 (4/4/4) starting at 08/04 1725            Nutritional Needs:   Healthy balance of carbohydrate, protein, and fat.       Malnutrition Assessment using AND/ASPEN or GLIM Clinical Characteristics:    Non-severe (Moderate) Protein-Calorie Malnutrition in the context of chronic illness (10/23/22 1244)  Energy Intake: < 75% of estimated energy requirement for > or equal to 1 month  Interpretation of Wt. Loss: > or equal to 5% x 1 month  Fat Loss: Mild  Muscle Loss: Mild  Malnutrition Score: 4            GOALS and EVALUATION     Patient to consume 75% or greater of po intake via combination of meals, snacks, and/or oral supplements within admission.  - New    Motivation, Barriers, and Compliance:  Evaluation of motivation, barriers, and compliance completed. No concerns identified at this time.     NUTRITION ASSESSMENT     Current nutrition therapy is appropriate and progressing toward meeting meeting nutritional needs at this time.   Patient would benefit from start of oral supplement to better meet nutritional needs.  Patient reports improving appetite but still not eating much during admission due to some dislike of food from facility  Patient amenable to supplements    Discharge Planning: Monitor for potential discharge needs with multi-disciplinary team.     Was the nutrition care plan completed? Yes       NUTRITION INTERVENTIONS and RECOMMENDATION     Trial Ensure Plus High Protein TID- strawberry and chocolate  Optimize glucose control with insulin regimen  Weigh weekly  Document PO intakes    Follow-Up Parameters:   1-2 times per week (and more frequent as indicated)    Alesia Morin, RD, LDN

## 2022-10-23 NOTE — Unmapped (Signed)
PULMONARY CONSULT  NOTE      Patient: Karen Barber(03/03/1966)  Reason for consultation: [name] is a 57 y.o. female who is seen in consultation at the request of Edd Fabian, DO for comprehensive evaluation of acute on chronic hypoxic respiratory failure.    Assessment and Recommendations:      Principal Problem:    Acute on chronic hypoxic respiratory failure (CMS-HCC)  Active Problems:    Pulmonary sarcoidosis (CMS-HCC)    HTN (hypertension)    Type 2 diabetes mellitus with hyperglycemia (CMS-HCC)    Bronchiectasis type 1 (CMS-HCC)    Chronic pulmonary aspergillosis (CMS-HCC)    Severe persistent asthma dependent on systemic steroids    Pulmonary hypertension (CMS-HCC)  Resolved Problems:    * No resolved hospital problems. *    [name] is a 57 y.o. 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 Given her improvement with lasix and steroids yesterday, we initially suspected that some degree of her worsening was related to volume overload and her obstructive lung disease, and while I do think volume overload is playing a role, I am more concerned now with infection and mucus burden leading to worsening gas exchange.  As we discussed today with each other and with her primary team, she has very little reserve with her combination of fibrotic sarcoid and pulmonary hypertension.  Any level of infection may be then decompensating heart.  I am less concerned that she has active sarcoid at this time.  Her lungs are actually relatively clear on exam today.  She does have some mild hemoptysis.      Recommendations:  -Would stop steroids at this time.  Will check sarcoid activity labs such as soluble IL-2 receptor, ACE level, and vitamin D 125.  If those suggest significant sarcoid activity we can think about restarting at 10 to 20 mg.  -I do think airway clearance will be helpful to her but I think hypertonic saline may not be helpful because of the case the patient's with fibrotic sarcoid can be more irritating than actually helpful.  Will pause on the saline at this time and have her use the aerobic a twice a day.  Decrease in the intensity of the airway clearance while she is in the hospital we will give her a regimen that she can use at home and will decrease the risk of hemoptysis as well.  She should do DuoNebs before the aerobic.  -For her pulmonary hypertension, continue p.o. diuretics and sildenafil.  Echo today did show significant and elevated pressures in the RV but only mild systolic dysfunction.  Will continue to aim for her being slightly negative daily  -Will continue antibiotics to focus on decreasing mucus burden while we are titrating the airway clearance to something that she can go home with.  Will do Augmentin for 10 to 14 days based on clinical response.  -If she is able to would be helpful to get a lower respiratory culture  -She reports she like to transfer her care to [facility].  I have sent notes to our pulmonary hypertension team as well as her known CF bronchiectasis team to see if we can get her set up with both groups.  She lives in [place] so transportation should hopefully not be too much of a problem.  -May be nearing discharge would like to see her ambulate her home oxygen without desaturation.  Out of bed to chair, PT OT referral today.  - Continue posaconazole for her invasive aspergillosis treatment, this will need to be addressed as an outpatient as well      We appreciate the opportunity to assist in the care of this patient.  Please page [phone] with any questions. We will continue to follow.    Charmaine Downs, MD    Attestation I directly provided consultative services as documented in this note and I personally spent 60 minutes face-to-face and non face-to-face in the care of this patient, which includes all pre, intra and post visit time on the date of service including examining the patient, evaluating/interpreting objective clinical data, coordinating care with the entire multidisciplinary care team and developing a comprehensive management plan as outlined above. All documented time was specific to the E/M visit and does not include any procedures that may have been performed.      Charmaine Downs, MD      Subjective:      History of Present Illness:  [name] is a 57 y.o. 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     Per chart review, she reports that she had increased dyspnea from her baseline following outpatient treatment for pneumonia (Augmentin and azithromycin prescribed by her PCP. She initially improved for 1-2 weeks, but then she began to feel the way she did prior to the antibiotic course. The dyspnea on exertion, cough and sputum became progressively worse over the few days prior to admission and she has needed to turn her oxygen up at home. She reports that the cough has been productive of thicker globs of pink sputum without clots.  She was hearing herself wheeze regularly and has had chest tightness but no chest pain. She denies any fevers or chills. She has been using her home nebulizer treatments with transient improvement in the wheeze and chest tightness. She has had increased lower extremity edema in the past few days. She has not weighed herself in the past few days.  She was diuresed and started on systemic steroids at admission. This morning, she reports feeling better in terms of her wheezing and her sputum production has decreased in quantity. She reports her legs look less swollen but still feel tight. She reports she did do regular airway clearance a few years ago, but has not recently and isn't sure why.     Interval history   -Additional history obtained today.  Longstanding dyspnea on exertion, frequently with walking and then will have exacerbations a couple times a year.  Current exacerbation over a few weeks with associated increased cough and some pinkish sputum.  Had antibiotics earlier last month (Augmentin on 7/8 for 10 days and azithromycin for 5 days) and cough and shortness of breath was much improved.  Had worsening after the antibiotics finished leading to ER.    Confirm no chronic therapy for sarcoid in many years.  No current sarcoid symptoms.  Has had 20 pounds of weight loss but that is intentional.  No fevers, night sweats, fatigue, eye pain,  abdominal pain.    For history of pulmonary hypertension, confirmed just on Lasix 40 twice daily and sildenafil 40 3 times daily.    No prior airway clearance that she can recall.  Does endorse feeling much better after she coughs up sputum.    Quite hyperglycemic on steroids.    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 A.niger from sputum 08/2016-09/2016.    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    Sarcoidosis 1995    Type 2 diabetes mellitus with hyperglycemia (CMS-HCC) 07/27/2014    Visual impairment     glasses     Past Surgical History:   Procedure Laterality Date    LUNG LOBECTOMY      PR AMPUTATION TOE,MT-P JT Right 12/12/2019    Procedure: Right foot fifth toe amputation at the metatarsophalangeal joint level;  Surgeon: Britt Bottom, DPM;  Location: MAIN OR [facility];  Service: Vascular    PR AMPUTATION TOE,MT-P JT Right 06/16/2020    Procedure: Right foot amputation of 3rd toe at mpj level;  Surgeon: Britt Bottom, DPM;  Location: MAIN OR [facility];  Service: Vascular    PR RIGHT HEART CATH O2 SATURATION & CARDIAC OUTPUT N/A 04/29/2019    Procedure: Right Heart Catheterization;  Surgeon: Rosana Hoes, MD;  Location: [facility];  Service: Cardiology    PR THORACOSCOPY SURG TOT PULM DECORT Left 12/14/2014    Procedure: THORACOSCOPY SURG; W/TOT PULM DECORTIC/PNEUMOLYS;  Surgeon: Evert Kohl, MD;  Location: MAIN OR [facility];  Service: Cardiothoracic    PR THORACOSCOPY W/THERA WEDGE RESEXN INITIAL UNILAT Left 12/03/2014    Procedure: THORACOSCOPY, SURGICAL; WITH THERAPEUTIC WEDGE RESECTION (EG, MASS, NODULE) INITIAL UNILATERAL;  Surgeon: Evert Kohl, MD;  Location: MAIN OR [facility];  Service: Cardiothoracic     Medications reviewed in Epic  Allergies as of 10/21/2022    (No Known Allergies)     Family History   Problem Relation Age of Onset    Dementia Mother     Diabetes Father     Diabetes Brother     Diabetes Maternal Aunt     Sarcoidosis Maternal Aunt     Diabetes Maternal Uncle     Diabetes Paternal Aunt     Diabetes Paternal Uncle      Social History     Tobacco Use    Smoking status: Former     Current packs/day: 0.00     Average packs/day: 0.5 packs/day for 9.0 years (4.5 ttl pk-yrs)     Types: Cigarettes     Start date: 05/02/1978     Quit date: 05/03/1987     Years since quitting: 35.4  Smokeless tobacco: Never   Substance Use Topics    Alcohol use: No     Alcohol/week: 0.0 standard drinks of alcohol        Objective:      Physical Exam:  Vitals:    10/23/22 0746 10/23/22 0800 10/23/22 1000 10/23/22 1200   BP:  126/77  128/75   Pulse: 79 71 86 76   Resp: 17 24 15 24    Temp: 37 ??C (98.6 ??F)   36.7 ??C (98.1 ??F)   TempSrc: Oral   Oral   SpO2: 97% 92% 96% 98%   Weight:       Height:         General: Alert, well-appearing, and in no distress.  Eyes: Anicteric sclera, conjunctiva clear.  ENT:  Mucous membranes moist and intact.  Lungs: Normal excursion. No wheezes or crackles. Diminished breath sounds.  Cardiovascular: Regular rate and rhythm, S1, S2 normal, no murmur, click, rub or gallop appreciated.  Abdomen: Soft, non-tender, not distended  Musculoskeletal: No clubbing and no synovitis. No edema.  Skin: No rashes or lesions.  Neuro: No focal neurological deficits.    Malnutrition Assessment by RD:  Non-severe (Moderate) Protein-Calorie Malnutrition in the context of chronic illness (10/23/22 1244)       Diagnostic Review:   All labs and images were personally reviewed.    Echo (01/2022): Normal LV systolic function with moderate LVH. Mild RV systolic dysfunction. Trivial MR, trivial PR, trivial TR. Peak TR jet 3.7 m/s (RVSP estimated 57 mmHg).    CT Chest (10/21/2022): No PE. Stable appearance of severe fibrotic disease with upper lobe volume loss (2/2 lobectomy and cystic bronchiectasis). Bulky mediastinal and hilar LAD, stable.     BNP 37.21 decreased from [facility] admission where he was elevated

## 2022-10-24 MED ADMIN — insulin lispro (HumaLOG) injection 0-20 Units: 0-20 [IU] | SUBCUTANEOUS | @ 01:00:00

## 2022-10-24 MED ADMIN — amoxicillin-clavulanate (AUGMENTIN) 875-125 mg per tablet 1 tablet: 875 mg | ORAL | @ 14:00:00 | Stop: 2022-11-05

## 2022-10-24 MED ADMIN — guaiFENesin (HUMIBID 3) tablet 400 mg: 400 mg | ORAL | @ 22:00:00

## 2022-10-24 MED ADMIN — gabapentin (NEURONTIN) capsule 300 mg: 300 mg | ORAL | @ 19:00:00

## 2022-10-24 MED ADMIN — insulin lispro (HumaLOG) injection 0-20 Units: 0-20 [IU] | SUBCUTANEOUS | @ 19:00:00

## 2022-10-24 MED ADMIN — guaiFENesin (HUMIBID 3) tablet 400 mg: 400 mg | ORAL | @ 15:00:00

## 2022-10-24 MED ADMIN — furosemide (LASIX) tablet 40 mg: 40 mg | ORAL | @ 09:00:00

## 2022-10-24 MED ADMIN — furosemide (LASIX) tablet 40 mg: 40 mg | ORAL | @ 19:00:00

## 2022-10-24 MED ADMIN — gabapentin (NEURONTIN) capsule 300 mg: 300 mg | ORAL | @ 14:00:00

## 2022-10-24 MED ADMIN — enoxaparin (LOVENOX) syringe 40 mg: 40 mg | SUBCUTANEOUS | @ 01:00:00

## 2022-10-24 MED ADMIN — insulin lispro (HumaLOG) injection 0-20 Units: 0-20 [IU] | SUBCUTANEOUS | @ 21:00:00

## 2022-10-24 MED ADMIN — ipratropium-albuterol (DUO-NEB) 0.5-2.5 mg/3 mL nebulizer solution 3 mL: 3 mL | RESPIRATORY_TRACT | @ 08:00:00

## 2022-10-24 MED ADMIN — ipratropium-albuterol (DUO-NEB) 0.5-2.5 mg/3 mL nebulizer solution 3 mL: 3 mL | RESPIRATORY_TRACT | @ 19:00:00

## 2022-10-24 MED ADMIN — amoxicillin-clavulanate (AUGMENTIN) 875-125 mg per tablet 1 tablet: 875 mg | ORAL | @ 01:00:00 | Stop: 2022-11-05

## 2022-10-24 MED ADMIN — guaiFENesin (HUMIBID 3) tablet 400 mg: 400 mg | ORAL | @ 19:00:00

## 2022-10-24 MED ADMIN — gabapentin (NEURONTIN) capsule 300 mg: 300 mg | ORAL | @ 01:00:00

## 2022-10-24 MED ADMIN — sildenafiL (pulm.hypertension) (REVATIO) tablet 20 mg: 20 mg | ORAL | @ 19:00:00

## 2022-10-24 MED ADMIN — insulin lispro (HumaLOG) injection 0-20 Units: 0-20 [IU] | SUBCUTANEOUS | @ 12:00:00

## 2022-10-24 MED ADMIN — posaconazole (NOXAFIL) delayed released tablet 200 mg: 200 mg | ORAL | @ 14:00:00

## 2022-10-24 MED ADMIN — sildenafiL (pulm.hypertension) (REVATIO) tablet 20 mg: 20 mg | ORAL | @ 14:00:00

## 2022-10-24 MED ADMIN — ipratropium-albuterol (DUO-NEB) 0.5-2.5 mg/3 mL nebulizer solution 3 mL: 3 mL | RESPIRATORY_TRACT | @ 13:00:00

## 2022-10-24 MED ADMIN — sildenafiL (pulm.hypertension) (REVATIO) tablet 20 mg: 20 mg | ORAL | @ 01:00:00

## 2022-10-24 MED ADMIN — ipratropium-albuterol (DUO-NEB) 0.5-2.5 mg/3 mL nebulizer solution 3 mL: 3 mL | RESPIRATORY_TRACT | @ 01:00:00

## 2022-10-24 MED ADMIN — insulin glargine (LANTUS) injection 20 Units: 20 [IU] | SUBCUTANEOUS | @ 01:00:00

## 2022-10-24 MED ADMIN — amlodipine (NORVASC) tablet 5 mg: 5 mg | ORAL | @ 14:00:00

## 2022-10-24 NOTE — Unmapped (Signed)
PT is A & O X4. VSS. Denies any pain. Continue to be on 6L [facility] [place],tolerating well. Refused to turn in bed, pt able to shift self in bed. SR on tele, afebrile,continue to monitor, see flowsheet for full assessment.     Problem: Adult Inpatient Plan of Care  Goal: Plan of Care Review  Outcome: Progressing  Goal: Patient-Specific Goal (Individualized)  Outcome: Progressing  Goal: Absence of Hospital-Acquired Illness or Injury  Outcome: Progressing  Intervention: Identify and Manage Fall Risk  Recent Flowsheet Documentation  Taken 10/24/2022 0400 by Terisa Starr, RN  Safety Interventions: low bed  Taken 10/24/2022 0200 by Terisa Starr, RN  Safety Interventions:   low bed   bed alarm  Taken 10/24/2022 0001 by Terisa Starr, RN  Safety Interventions:   low bed   bed alarm  Taken 10/23/2022 2200 by Terisa Starr, RN  Safety Interventions:   low bed   bed alarm  Taken 10/23/2022 1940 by Terisa Starr, RN  Safety Interventions:   bed alarm   low bed  Goal: Optimal Comfort and Wellbeing  Outcome: Progressing  Goal: Readiness for Transition of Care  Outcome: Progressing  Goal: Rounds/Family Conference  Outcome: Progressing     Problem: Fall Injury Risk  Goal: Absence of Fall and Fall-Related Injury  Outcome: Progressing  Intervention: Promote Injury-Free Environment  Recent Flowsheet Documentation  Taken 10/24/2022 0400 by Terisa Starr, RN  Safety Interventions: low bed  Taken 10/24/2022 0200 by Terisa Starr, RN  Safety Interventions:   low bed   bed alarm  Taken 10/24/2022 0001 by Terisa Starr, RN  Safety Interventions:   low bed   bed alarm  Taken 10/23/2022 2200 by Terisa Starr, RN  Safety Interventions:   low bed   bed alarm  Taken 10/23/2022 1940 by Terisa Starr, RN  Safety Interventions:   bed alarm   low bed     Problem: Breathing Pattern Ineffective  Goal: Effective Breathing Pattern  Outcome: Progressing  Intervention: Promote Improved Breathing Pattern  Recent Flowsheet Documentation  Taken 10/24/2022 0400 by Terisa Starr, RN  Head of Bed [facility]) Positioning: HOB at 30-45 degrees  Taken 10/24/2022 0001 by Terisa Starr, RN  Head of Bed [facility]) Positioning: HOB at 30-45 degrees  Taken 10/23/2022 2200 by Terisa Starr, RN  Head of Bed [facility]) Positioning: HOB at 30-45 degrees  Taken 10/23/2022 1940 by Terisa Starr, RN  Head of Bed [facility]) Positioning: HOB at 30-45 degrees     Problem: Gas Exchange Impaired  Goal: Optimal Gas Exchange  Outcome: Progressing  Intervention: Optimize Oxygenation and Ventilation  Recent Flowsheet Documentation  Taken 10/24/2022 0400 by Terisa Starr, RN  Head of Bed [facility]) Positioning: HOB at 30-45 degrees  Taken 10/24/2022 0001 by Terisa Starr, RN  Head of Bed [facility]) Positioning: HOB at 30-45 degrees  Taken 10/23/2022 2200 by Terisa Starr, RN  Head of Bed [facility]) Positioning: HOB at 30-45 degrees  Taken 10/23/2022 1940 by Terisa Starr, RN  Head of Bed [facility]) Positioning: HOB at 30-45 degrees     Problem: Self-Care Deficit  Goal: Improved Ability to Complete Activities of Daily Living  Outcome: Progressing     Problem: Comorbidity Management  Goal: Maintenance of COPD Symptom Control  Outcome: Progressing  Goal: Blood Glucose Levels Within Targeted Range  Outcome: Progressing  Goal: Blood Pressure in Desired Range  Outcome: Progressing     Problem: Glycemic  Control Impaired  Goal: Blood Glucose Level Within Targeted Range  Outcome: Progressing     Problem: Malnutrition  Goal: Improved Nutritional Intake  Outcome: Progressing

## 2022-10-24 NOTE — Unmapped (Signed)
Karen Barber states she is now down to 3L.  Best she has been at in the last 5 years.  She is aware her pulmonologist needs to tell her what her O2 setting should be set.

## 2022-10-24 NOTE — Unmapped (Signed)
Pt remains stepdown status for moderate oxygen needs. She is at neurological baseline; ambulatory with no assist. NSR and normotensive. 3L [facility] at rest; 6L [facility] at baseline. Small amount of hemoptysis noted; md aware and we will monitor her overnight for this reason. Otherwise patient is looking good and plan is to discharge tomorrow. Continue to monitor and maintain safe env.         Problem: Suicide Risk  Goal: Absence of Self-Harm  Intervention: Promote Psychosocial Wellbeing  Recent Flowsheet Documentation  Taken 10/24/2022 0800 by Sherrian Divers, RN  Sleep/Rest Enhancement:   awakenings minimized   noise level reduced   regular sleep/rest pattern promoted     Problem: Adult Inpatient Plan of Care  Goal: Plan of Care Review  Outcome: Progressing  Goal: Patient-Specific Goal (Individualized)  Outcome: Progressing  Goal: Absence of Hospital-Acquired Illness or Injury  Outcome: Progressing  Intervention: Identify and Manage Fall Risk  Recent Flowsheet Documentation  Taken 10/24/2022 0800 by Sherrian Divers, RN  Safety Interventions:   aspiration precautions   bleeding precautions   commode/urinal/bedpan at bedside   fall reduction program maintained   lighting adjusted for tasks/safety   nonskid shoes/slippers when out of bed  Intervention: Prevent Skin Injury  Recent Flowsheet Documentation  Taken 10/24/2022 0800 by Sherrian Divers, RN  Device Skin Pressure Protection:   absorbent pad utilized/changed   positioning supports utilized  Skin Protection:   cleansing with dimethicone incontinence wipes   incontinence pads utilized   tubing/devices free from skin contact  Goal: Optimal Comfort and Wellbeing  Outcome: Progressing  Goal: Readiness for Transition of Care  Outcome: Progressing  Goal: Rounds/Family Conference  Outcome: Progressing     Problem: Fall Injury Risk  Goal: Absence of Fall and Fall-Related Injury  Outcome: Progressing  Intervention: Promote Scientist, clinical (histocompatibility and immunogenetics) Documentation  Taken 10/24/2022 0800 by Sherrian Divers, RN  Safety Interventions:   aspiration precautions   bleeding precautions   commode/urinal/bedpan at bedside   fall reduction program maintained   lighting adjusted for tasks/safety   nonskid shoes/slippers when out of bed     Problem: Breathing Pattern Ineffective  Goal: Effective Breathing Pattern  Outcome: Progressing  Intervention: Promote Improved Breathing Pattern  Recent Flowsheet Documentation  Taken 10/24/2022 1600 by Sherrian Divers, RN  Head of Bed [facility]) Positioning: HOB at 30 degrees  Taken 10/24/2022 0800 by Sherrian Divers, RN  Head of Bed [facility]) Positioning: HOB at 30 degrees     Problem: Gas Exchange Impaired  Goal: Optimal Gas Exchange  Outcome: Progressing  Intervention: Optimize Oxygenation and Ventilation  Recent Flowsheet Documentation  Taken 10/24/2022 1600 by Sherrian Divers, RN  Head of Bed [facility]) Positioning: HOB at 30 degrees  Taken 10/24/2022 0800 by Sherrian Divers, RN  Head of Bed (HOB) Positioning: HOB at 30 degrees     Problem: Self-Care Deficit  Goal: Improved Ability to Complete Activities of Daily Living  Outcome: Resolved     Problem: Comorbidity Management  Goal: Maintenance of COPD Symptom Control  Outcome: Resolved  Goal: Blood Glucose Levels Within Targeted Range  Outcome: Ongoing - Unchanged  Goal: Blood Pressure in Desired Range  Outcome: Resolved     Problem: Glycemic Control Impaired  Goal: Blood Glucose Level Within Targeted Range  Outcome: Progressing     Problem: Malnutrition  Goal: Improved Nutritional Intake  Outcome: Resolved

## 2022-10-24 NOTE — Unmapped (Signed)
[place] Hospitalist Daily Progress Note     LOS: 3 days     Assessment/Plan:  Principal Problem:    Acute on chronic hypoxic respiratory failure (CMS-HCC)  Active Problems:    Pulmonary sarcoidosis (CMS-HCC)    HTN (hypertension)    Type 2 diabetes mellitus with hyperglycemia (CMS-HCC)    Bronchiectasis type 1 (CMS-HCC)    Chronic pulmonary aspergillosis (CMS-HCC)    Severe persistent asthma dependent on systemic steroids    Pulmonary hypertension (CMS-HCC)  Resolved Problems:    * No resolved hospital problems. *         Deniece Ree is a 57 y.o. female who is presenting to [facility] with Acute on chronic hypoxic respiratory failure (CMS-HCC), in the setting of the following pertinent/contributing co-morbidities: Pulmonary HTN, Sarcoidosis, Chronic pulmonary aspergillosis .        Acute on chronic hypoxic respiratory failure (CMS-HCC) likely due to worsening pulmonary HTN and likely bronchiectasis flare  Presenting with 3 week progressively worsening DOE, after being treated for pneumonia about one month. Uses 5 to 6L  at rest with increasing requirement up to 8-10L with exertion since she became worse. Also reported desaturations to the 70s. Pink sputum production has increased in volume over the past week. CTA chest demonstrated no PE with unchanged fibrotic pulmonary sarcoidosis and upper lobe bronchiectasis that was also unchanged.  She is now euvolemic.    - At home, was on 5-8 liters nasal cannula at home (increases with exertion).  She has improved oxygenation here - down to 3 liters at rest and at most needed 6 liters with exertion.  Airway clearance is helping her immensely.    - Lasix 40mg  po BID - appears euvolemic now and bnp normal.  Renal function stable.    - Pulmonary Medicine following   - Sputum and AFB culture pending   - can stop trending bnp unless clinically worsens  - HTS 3% stopped given intermittent blood in sputum and increase in hemoptysis risk given her advanced sarcoidosis/fibrosis after discussing this with Pulmonology yesterday.    -Aerobika BID   - Augmentin BID - EOT 11/05/2022.    -anticipate dc tomorrow.       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    Thought to be due to group 2 (has had an elevated wedge on prior RHC) vs group 5. Most recent RHC 10/23 with RA 5, wedge 17, PA 71/30, mean 45. She reports compliance with revatio 20mg  TID and lasix 40mg  BID. Net negative 2.3 liters and renal function is stable.  Continue with current plan, pulmonology following.  Needs long-term outpatient follow-up to discuss next steps with regards to [facility] management (?adempas vs veletri vs ambrisentan, etc).  Continuing revatio.  BP has been stable. TTE yesterday shows severe RV dysfunction and PASP of 91.  Overall though euvolemic with improving o2 requirements.  Can defer further treatment of PH to the outpatient setting.  Patient would like to follow-up with pulmonary at [facility] rather than [facility] so would need referral on DC.      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 Reports that she stopped taking all inhalers except albuterol due to self reported risk of pneumonia associated with them.   - aerobika BID, continuing  duonebs q6 hours.  Informed RT and RN to give duonebs prior to [place].    - Pulmonary Medicine consult      DMII on Insulin, uncontrolled, exacerbated by steroid-induced hyperglycemia.   Last A1c 8.3 on 09/25/2022.  On glargine 18U at bedtime and sliding scale novolog QID and HS. Poor glycemic control while on steroids. Steroids stopped yesterday though did get a dose yesterday morning and her sugars went above 500 which was not unexpected.  Patient's glucose was 167 this morning which is at goal for fasting AM glucose so will not adjust her lantus dose for now - keep at 20 units nightly.  Keeping on sliding scale for now - hopefully will need far less today since no steroids.       CKD stage 3  - Baseline creatinine around 1.2, acceptable at 1.24 now.       Disposition:  home tomorrow likely.      DVT prophylaxis. Lovenox      Disposition: can transfer to step down, orders placed.  Anticipate home in next 48 hours to see what her long-term home O2 requirements will be.      Please page the [facility]) pager at [phone] with questions.      Pending labs:   Pending Labs       Order Current Status    (Send out) Vitamin D 1,25 dihydroxy In process    Angiotensin Converting Enzyme In process    Soluble IL-2 Receptor In process    Blood Culture #1 Preliminary result    Blood Culture #2 Preliminary result    Lower Respiratory Culture Preliminary result            Subjective:   Patient states she feels much better especially after she is able to cough up her secretions.  She does not have chest pain.  Occasionally has blood-tinged sputum but this is improving.      Objective:   Physical Exam:    GEN: NAD, sitting up in bed.    RESPIRATORY: Breathing pattern was normal and the chest moved symmetrically.  Lung sounds were diminished and there were no adventitious sounds.    CARDIOVASCULAR: Heart rate and rhythm were normal.  S1 and S2 were normal and there was a 2/6 systolic murmurs.         Vital signs in last 24 hours:  Temp:  [36.5 ??C (97.7 ??F)-37 ??C (98.6 ??F)] 36.6 ??C (97.9 ??F)  Heart Rate:  [82-100] 87  SpO2 Pulse:  [83-99] 90  Resp:  [17-23] 17  BP: (108-153)/(66-80) 108/75  MAP (mmHg):  [86-101] 86  SpO2:  [89 %-100 %] 96 %    Intake/Output last 24 hours:    Intake/Output Summary (Last 24 hours) at 10/24/2022 1302  Last data filed at 10/24/2022 1200  Gross per 24 hour   Intake 120 ml   Output 2800 ml   Net -2680 ml       Medications:   Scheduled Meds:   amlodipine  5 mg Oral Daily    amoxicillin-clavulanate  875 mg Oral Q12H SCH    enoxaparin (LOVENOX) injection  40 mg Subcutaneous Q24H    furosemide  40 mg Oral BID    gabapentin  300 mg Oral TID    guaiFENesin  400 mg Oral Q4H SCH    insulin glargine  20 Units Subcutaneous Nightly    insulin lispro  0-20 Units Subcutaneous ACHS    ipratropium-albuterol  3 mL Nebulization Q6H (  RT)    posaconazole  200 mg Oral Daily    sildenafiL (pulm.hypertension)  20 mg Oral TID         Edd Fabian, DO MD  [facility].

## 2022-10-24 NOTE — Unmapped (Signed)
PULMONARY CONSULT  NOTE      Patient: Karen Barber(1966/01/26)  Reason for consultation: Karen Barber is a 57 y.o. female who is seen in consultation at the request of Edd Fabian, DO for comprehensive evaluation of acute on chronic hypoxic respiratory failure.    Assessment and Recommendations:      Principal Problem:    Acute on chronic hypoxic respiratory failure (CMS-HCC)  Active Problems:    Pulmonary sarcoidosis (CMS-HCC)    HTN (hypertension)    Type 2 diabetes mellitus with hyperglycemia (CMS-HCC)    Bronchiectasis type 1 (CMS-HCC)    Chronic pulmonary aspergillosis (CMS-HCC)    Severe persistent asthma dependent on systemic steroids    Pulmonary hypertension (CMS-HCC)  Resolved Problems:    * No resolved hospital problems. *    Karen Barber is a 57 y.o. 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 Given her improvement with lasix and steroids on admission, we initially suspected that some degree of her worsening was related to volume overload and her obstructive lung disease, and while I do think volume overload is playing a role, I am more concerned now with infection and mucus burden leading to worsening gas exchange.  She has very little reserve with her combination of fibrotic sarcoid and pulmonary hypertension so any level of infection may be then decompensating heart.  I am less concerned that she has active sarcoid at this time.  She does have some mild hemoptysis over the past 2 days that is improving which also supports an infection that is driving her acute on chronic decompensation.     She has made some clinical improvement since last clinical encounter.     Recommendations:  -Remain off of steroids pending sarciod labs; ok for outpatient follow up   -I do think airway clearance will be helpful to her but I think hypertonic saline may not be helpful because pt's w/fibrotic sarcoid can have more irritation from HTS than other pts with bronchiectasis. Will focus on AC with duonebs (she has at home) and Aerobika BID.   -For her pulmonary hypertension, continue p.o. diuretics and sildenafil.  Echo today did show significant and elevated pressures in the RV but only mild systolic dysfunction.  Will continue to aim for her being slightly negative daily.   -Will continue antibiotics to focus on decreasing mucus burden while we are titrating the airway clearance to something that she can go home with.  Will do Augmentin for 14 days based on clinical response. She was able to submit a LRTCx so we will monitor it for emergence of PSA or MRSA which would require additional abx  -She reports she like to transfer her care to [facility].  I have sent notes to our pulmonary hypertension team as well as her known CF bronchiectasis team to see if we can get her set up with both groups.  She lives in [place] so transportation should hopefully not be too much of a problem.  -May be nearing discharge would like to see her ambulate her home oxygen without desaturation (6LPM).  She has the ability to support oxygen needs to 10 LPM at home.  - Continue posaconazole for her invasive aspergillosis treatment, this will  need to be addressed as an outpatient as well  - Likely ok for discharge in next 24 hours as long as hemoptysis has improved    We appreciate the opportunity to assist in the care of this patient.  Please page [phone] with any questions. We will continue to follow.    Charmaine Downs, MD    Attestation      I directly provided consultative services as documented in this note and I personally spent 40 minutes face-to-face and non face-to-face in the care of this patient, which includes all pre, intra and post visit time on the date of service including examining the patient, evaluating/interpreting objective clinical data, coordinating care with the entire multidisciplinary care team and developing a comprehensive management plan as outlined above. All documented time was specific to the E/M visit and does not include any procedures that may have been performed.      Charmaine Downs, MD      Subjective:      History of Present Illness:  Karen Barber is a 58 y.o. 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     Per chart review, she reports that she had increased dyspnea from her baseline following outpatient treatment for pneumonia (Augmentin and azithromycin prescribed by her PCP. She initially improved for 1-2 weeks, but then she began to feel the way she did prior to the antibiotic course. The dyspnea on exertion, cough and sputum became progressively worse over the few days prior to admission and she has needed to turn her oxygen up at home. She reports that the cough has been productive of thicker globs of pink sputum without clots.  She was hearing herself wheeze regularly and has had chest tightness but no chest pain. She denies any fevers or chills. She has been using her home nebulizer treatments with transient improvement in the wheeze and chest tightness. She has had increased lower extremity edema in the past few days. She has not weighed herself in the past few days.  She was diuresed and started on systemic steroids at admission. This morning, she reports feeling better in terms of her wheezing and her sputum production has decreased in quantity. She reports her legs look less swollen but still feel tight. She reports she did do regular airway clearance a few years ago, but has not recently and isn't sure why.     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 A.niger from sputum 08/2016-09/2016.    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    Sarcoidosis 1995    Type 2 diabetes mellitus with hyperglycemia (CMS-HCC) 07/27/2014    Visual impairment     glasses     Past Surgical History:   Procedure Laterality Date    LUNG LOBECTOMY      PR AMPUTATION TOE,MT-P JT Right 12/12/2019    Procedure: Right foot fifth toe amputation at the metatarsophalangeal joint level;  Surgeon: Britt Bottom, DPM;  Location: MAIN [facility];  Service: Vascular    PR AMPUTATION TOE,MT-P JT Right 06/16/2020    Procedure: Right foot amputation of 3rd toe at mpj level;  Surgeon: Britt Bottom, DPM;  Location: MAIN OR [facility];  Service: Vascular    PR RIGHT HEART CATH O2 SATURATION & CARDIAC OUTPUT N/A 04/29/2019    Procedure: Right Heart Catheterization;  Surgeon: Rosana Hoes, MD;  Location: [facility] CATH;  Service: Cardiology    PR THORACOSCOPY SURG TOT PULM DECORT Left 12/14/2014 Procedure: THORACOSCOPY SURG; W/TOT PULM DECORTIC/PNEUMOLYS;  Surgeon: Evert Kohl, MD;  Location: MAIN OR [facility];  Service: Cardiothoracic    PR THORACOSCOPY W/THERA WEDGE RESEXN INITIAL UNILAT Left 12/03/2014    Procedure: THORACOSCOPY, SURGICAL; WITH THERAPEUTIC WEDGE RESECTION (EG, MASS, NODULE) INITIAL UNILATERAL;  Surgeon: Evert Kohl, MD;  Location: MAIN OR [facility];  Service: Cardiothoracic     Medications reviewed in Epic  Allergies as of 10/21/2022    (No Known Allergies)     Family History   Problem Relation Age of Onset    Dementia Mother     Diabetes Father     Diabetes Brother     Diabetes Maternal Aunt     Sarcoidosis Maternal Aunt     Diabetes Maternal Uncle     Diabetes Paternal Aunt     Diabetes Paternal Uncle      Social History     Tobacco Use    Smoking status: Former     Current packs/day: 0.00     Average packs/day: 0.5 packs/day for 9.0 years (4.5 ttl pk-yrs)     Types: Cigarettes     Start date: 05/02/1978     Quit date: 05/03/1987     Years since quitting: 35.5    Smokeless tobacco: Never   Substance Use Topics    Alcohol use: No     Alcohol/week: 0.0 standard drinks of alcohol        Objective:      Physical Exam:  Vitals:    10/24/22 0745 10/24/22 0941 10/24/22 1017 10/24/22 1155   BP: 130/76 138/67  108/75   Pulse: 92   87   Resp: 23   17   Temp: 36.5 ??C (97.7 ??F)   36.6 ??C (97.9 ??F)   TempSrc: Oral   Oral   SpO2: 96%  91% 96%   Weight:       Height:         General: Alert, well-appearing, and in no distress.  Eyes: Anicteric sclera, conjunctiva clear.  ENT:  Mucous membranes moist and intact.  Lungs: Normal excursion. No wheezes or crackles. Diminished breath sounds.  Cardiovascular: Regular rate and rhythm, S1, S2 normal, no murmur, click, rub or gallop appreciated.  Abdomen: Soft, non-tender, not distended  Musculoskeletal:  No clubbing and no synovitis. No edema.  Skin: No rashes or lesions.  Neuro: No focal neurological deficits.    Malnutrition Assessment by RD:  Non-severe (Moderate) Protein-Calorie Malnutrition in the context of chronic illness (10/23/22 1244)       Diagnostic Review:   All labs and images were personally reviewed.    Echo (01/2022): Normal LV systolic function with moderate LVH. Mild RV systolic dysfunction. Trivial MR, trivial PR, trivial TR. Peak TR jet 3.7 m/s (RVSP estimated 57 mmHg).    CT Chest (10/21/2022): No PE. Stable appearance of severe fibrotic disease with upper lobe volume loss (2/2 lobectomy and cystic bronchiectasis). Bulky mediastinal and hilar LAD, stable.     BNP 37.21 decreased from [place] admission where he was elevated

## 2022-10-25 DIAGNOSIS — R0600 Dyspnea, unspecified: Principal | ICD-10-CM

## 2022-10-25 DIAGNOSIS — D860 Sarcoidosis of lung: Principal | ICD-10-CM

## 2022-10-25 DIAGNOSIS — I272 Pulmonary hypertension, unspecified: Principal | ICD-10-CM

## 2022-10-25 DIAGNOSIS — J479 Bronchiectasis, uncomplicated: Principal | ICD-10-CM

## 2022-10-25 MED ORDER — IPRATROPIUM BROMIDE 0.02 % SOLUTION FOR INHALATION
Freq: Four times a day (QID) | RESPIRATORY_TRACT | 0 refills | 30 days | Status: CP
Start: 2022-10-25 — End: 2023-03-18

## 2022-10-25 MED ORDER — BASAGLAR KWIKPEN U-100 INSULIN 100 UNIT/ML (3 ML) SUBCUTANEOUS
Freq: Every evening | SUBCUTANEOUS | 0 refills | 83 days | Status: CP
Start: 2022-10-25 — End: 2022-11-24

## 2022-10-25 MED ORDER — AMOXICILLIN 875 MG-POTASSIUM CLAVULANATE 125 MG TABLET
ORAL_TABLET | Freq: Two times a day (BID) | ORAL | 0 refills | 11 days | Status: CP
Start: 2022-10-25 — End: 2022-11-05
  Filled 2022-10-25: qty 22, 11d supply, fill #0

## 2022-10-25 MED ORDER — SILDENAFIL (PULMONARY HYPERTENSION) 20 MG TABLET
ORAL_TABLET | Freq: Three times a day (TID) | ORAL | 0 refills | 30 days | Status: CP
Start: 2022-10-25 — End: 2022-11-24
  Filled 2022-10-25: qty 90, 30d supply, fill #0

## 2022-10-25 MED ADMIN — sildenafiL (pulm.hypertension) (REVATIO) tablet 20 mg: 20 mg | ORAL | @ 12:00:00 | Stop: 2022-10-25

## 2022-10-25 MED ADMIN — insulin lispro (HumaLOG) injection 0-20 Units: 0-20 [IU] | SUBCUTANEOUS | @ 01:00:00

## 2022-10-25 MED ADMIN — furosemide (LASIX) tablet 40 mg: 40 mg | ORAL | @ 10:00:00 | Stop: 2022-10-25

## 2022-10-25 MED ADMIN — ipratropium-albuterol (DUO-NEB) 0.5-2.5 mg/3 mL nebulizer solution 3 mL: 3 mL | RESPIRATORY_TRACT | @ 14:00:00 | Stop: 2022-10-25

## 2022-10-25 MED ADMIN — guaiFENesin (HUMIBID 3) tablet 400 mg: 400 mg | ORAL | @ 01:00:00

## 2022-10-25 MED ADMIN — gabapentin (NEURONTIN) capsule 300 mg: 300 mg | ORAL | @ 12:00:00 | Stop: 2022-10-25

## 2022-10-25 MED ADMIN — enoxaparin (LOVENOX) syringe 40 mg: 40 mg | SUBCUTANEOUS | @ 01:00:00

## 2022-10-25 MED ADMIN — gabapentin (NEURONTIN) capsule 300 mg: 300 mg | ORAL | @ 01:00:00

## 2022-10-25 MED ADMIN — amoxicillin-clavulanate (AUGMENTIN) 875-125 mg per tablet 1 tablet: 875 mg | ORAL | @ 12:00:00 | Stop: 2022-10-25

## 2022-10-25 MED ADMIN — amoxicillin-clavulanate (AUGMENTIN) 875-125 mg per tablet 1 tablet: 875 mg | ORAL | @ 01:00:00 | Stop: 2022-11-05

## 2022-10-25 MED ADMIN — ipratropium-albuterol (DUO-NEB) 0.5-2.5 mg/3 mL nebulizer solution 3 mL: 3 mL | RESPIRATORY_TRACT | @ 08:00:00 | Stop: 2022-10-25

## 2022-10-25 MED ADMIN — guaiFENesin (HUMIBID 3) tablet 400 mg: 400 mg | ORAL | @ 10:00:00 | Stop: 2022-10-25

## 2022-10-25 MED ADMIN — ipratropium-albuterol (DUO-NEB) 0.5-2.5 mg/3 mL nebulizer solution 3 mL: 3 mL | RESPIRATORY_TRACT | @ 01:00:00

## 2022-10-25 MED ADMIN — guaiFENesin (HUMIBID 3) tablet 400 mg: 400 mg | ORAL | @ 07:00:00 | Stop: 2022-10-25

## 2022-10-25 MED ADMIN — amlodipine (NORVASC) tablet 5 mg: 5 mg | ORAL | @ 12:00:00 | Stop: 2022-10-25

## 2022-10-25 MED ADMIN — sildenafiL (pulm.hypertension) (REVATIO) tablet 20 mg: 20 mg | ORAL | @ 01:00:00

## 2022-10-25 MED ADMIN — posaconazole (NOXAFIL) delayed released tablet 200 mg: 200 mg | ORAL | @ 12:00:00 | Stop: 2022-10-25

## 2022-10-25 MED ADMIN — insulin glargine (LANTUS) injection 20 Units: 20 [IU] | SUBCUTANEOUS | @ 01:00:00

## 2022-10-25 MED ADMIN — guaiFENesin (HUMIBID 3) tablet 400 mg: 400 mg | ORAL | @ 15:00:00 | Stop: 2022-10-25

## 2022-10-25 NOTE — Unmapped (Addendum)
Karen Barber is a 57 y.o. female who is presenting to [facility] with Acute on chronic hypoxic respiratory failure (CMS-HCC), in the setting of the following pertinent/contributing co-morbidities: Pulmonary HTN, Sarcoidosis, Chronic pulmonary aspergillosis .     Acute on chronic hypoxic respiratory failure (CMS-HCC) likely due to worsening pulmonary HTN and likely bronchiectasis flare (mixed ILD and history of airway obstruction, bronchiectasis)  Presenting with 3 week progressively worsening DOE, after being treated for pneumonia about one month. Uses 5 to 6L [place] at rest with increasing requirement up to 8-10L with exertion since she became worse. Also reported desaturations to the 70s. Pink sputum production has increased in volume over the past week. CTA chest demonstrated no PE with unchanged fibrotic pulmonary sarcoidosis and upper lobe bronchiectasis that was also unchanged.  The patient appeared euvolemic and was started on her home lasix dose which was 40 mg po BID.  The patient had a lot of secretions which she was coughing up and it felt that due to her lung anatomy, she has significant bronchiectasis and because of that, needed more airway clearance.  Initially was on [place] once a day and hypertonic saline.  However, the patient started to have increased hemoptysis with the hypertonic saline which can be a known effect in patients with fibrosing sarcoidosis per discussion with pulmonology who recommended stopping the hypertonic saline nebulizer treatments.  The [place] treatments were increased to BID with specific instructions to have her nebulizer treatments given prior to the [place] treatments.  The patient was initially started on steroids but unclear benefit with her scarring and in addition, this led to severe hyperglycemia so this was stopped.  The patient per pulmonology recs also was placed on augmentin for a 14 day course with EOT 11/05/2022 for a presumed bronchiectasis flare.  Her oxygen requirements were at her baseline (5-8 liters) and at times even better (was able to get down to 3 liters [place]).  Her SPO2 goal is 88-92%.  She was discharged on 10/25/2022 and was on 5 liters  at the time of discharge.      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    Thought to be due to group 2 (has had an elevated wedge on prior RHC) vs group 5. Most recent RHC 10/23 with RA 5, wedge 17, PA 71/30, mean 45. She reported compliance with revatio 20mg  TID and lasix 40mg  BID.  The patient also was net negative with regards to her I?Os throughout her hospital stay.  The patient had a repeat TTE this admission which showed expectedly severe RV dysfunction and a PASP of 91.  However, her O2 requirements improved/were stable and was euvolemic.  She was referred to outpatient pulmonology here (wants to transition from [facility] to here) to follow-up with the pulmonary HTN group for discussion regarding possible future treatments such as but not limited to adempas, veletri, or endothelin-receptor antagonists.     DM II on insulin, exacerbated by steroids   Patient's A1C on 09/25/2022 was 8.3.  The patient required higher doses of her lantus here (on 18 units at home) given she was on prednisone but once that was stopped, her glucoses were much better controlled and was sent home on her home lantus and sliding scale regimen.      Patient's creatinine was at her baseline of around 1.2.

## 2022-10-25 NOTE — Unmapped (Signed)
PULMONARY CONSULT  NOTE      Patient: Karen Barber([birth year])  Reason for consultation: [name] is a 57 y.o. female who is seen in consultation at the request of Edd Fabian, DO for comprehensive evaluation of acute on chronic hypoxic respiratory failure.    Assessment and Recommendations:      Principal Problem:    Acute on chronic hypoxic respiratory failure (CMS-HCC)  Active Problems:    Pulmonary sarcoidosis (CMS-HCC)    HTN (hypertension)    Type 2 diabetes mellitus with hyperglycemia (CMS-HCC)    Bronchiectasis type 1 (CMS-HCC)    Chronic pulmonary aspergillosis (CMS-HCC)    Severe persistent asthma dependent on systemic steroids    Pulmonary hypertension (CMS-HCC)  Resolved Problems:    * No resolved hospital problems. *    [name] is a 57 y.o. 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 Given her improvement with lasix and steroids on admission, we initially suspected that some degree of her worsening was related to volume overload and her obstructive lung disease, and while I do think volume overload is playing a role, I am more concerned now with infection and mucus burden leading to worsening gas exchange.  She has very little reserve with her combination of fibrotic sarcoid and pulmonary hypertension so any level of infection may be then decompensating heart.  I am less concerned that she has active sarcoid at this time.  She does have some mild hemoptysis over the past 2 days that is improving which also supports an infection that is driving her acute on chronic decompensation.     She has made some clinical improvement since last clinical encounter and is ready for discharge.     Recommendations:  -Remain off of steroids pending sarciod labs; ok for outpatient follow up   -Should complete 14 days total of abx, augmentin BID at current dosage  -Continue AC with duonebs with Aerobika BID. Education provided by RT today on how to use it and clean it. SHOULD NOT go home on 3% saline due to hemoptysis. Reviewed that if hemoptysis increases in amount (several teaspoons a day, she should pause Brazil for a day and then restart).   -Should continue posaconzole for now as well  -For her pulmonary hypertension, continue p.o. diuretics and sildenafil.  Echo did show significant and elevated pressures in the RV but only mild systolic dysfunction.  Will continue to aim for her being slightly negative daily which she has on this current diuretic regimen.   -I have messaged pulmonary schedulers who are working on scheduling follow up with both [facility] pulmonary HTN team and bronchiectasis teams. She is aware that this may be 2 separate appointments    We appreciate the opportunity to assist in the care of this patient.  Please page [phone] with any questions. We will continue to follow.    Charmaine Downs, MD    Attestation      I directly provided consultative services as documented in this note and I personally spent 30 minutes face-to-face and non face-to-face in the care of this patient, which includes all pre, intra and post visit time on the date of service including examining the patient, evaluating/interpreting objective  clinical data, coordinating care with the entire multidisciplinary care team and developing a comprehensive management plan as outlined above. All documented time was specific to the E/M visit and does not include any procedures that may have been performed.      Charmaine Downs, MD      Subjective:      History of Present Illness:  [name] is a 57 y.o. 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     Per chart review, she reports that she had increased dyspnea from her baseline following outpatient treatment for pneumonia (Augmentin and azithromycin prescribed by her PCP. She initially improved for 1-2 weeks, but then she began to feel the way she did prior to the antibiotic course. The dyspnea on exertion, cough and sputum became progressively worse over the few days prior to admission and she has needed to turn her oxygen up at home. She reports that the cough has been productive of thicker globs of pink sputum without clots.  She was hearing herself wheeze regularly and has had chest tightness but no chest pain. She denies any fevers or chills. She has been using her home nebulizer treatments with transient improvement in the wheeze and chest tightness. She has had increased lower extremity edema in the past few days. She has not weighed herself in the past few days.  She was diuresed and started on systemic steroids at admission. This morning, she reports feeling better in terms of her wheezing and her sputum production has decreased in quantity. She reports her legs look less swollen but still feel tight. She reports she did do regular airway clearance a few years ago, but has not recently and isn't sure why.     Interval history   -Feeling better than she has in quite awhile  -3LPM O2 needs at rest  -mild hemoptysis ongoing, only after AC. 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 A.niger from sputum 08/2016-09/2016.    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    Sarcoidosis 1995    Type 2 diabetes mellitus with hyperglycemia (CMS-HCC) 07/27/2014    Visual impairment     glasses     Past Surgical History:   Procedure Laterality Date    LUNG LOBECTOMY      PR AMPUTATION TOE,MT-P JT Right 12/12/2019  Procedure: Right foot fifth toe amputation at the metatarsophalangeal joint level;  Surgeon: Britt Bottom, DPM;  Location: MAIN OR [facility];  Service: Vascular    PR AMPUTATION TOE,MT-P JT Right 06/16/2020    Procedure: Right foot amputation of 3rd toe at mpj level;  Surgeon: Britt Bottom, DPM;  Location: MAIN OR [facility];  Service: Vascular    PR RIGHT HEART CATH O2 SATURATION & CARDIAC OUTPUT N/A 04/29/2019    Procedure: Right Heart Catheterization;  Surgeon: Rosana Hoes, MD;  Location: [facility] CATH;  Service: Cardiology    PR THORACOSCOPY SURG TOT PULM DECORT Left 12/14/2014    Procedure: THORACOSCOPY SURG; W/TOT PULM DECORTIC/PNEUMOLYS;  Surgeon: Evert Kohl, MD;  Location: MAIN OR [facility];  Service: Cardiothoracic    PR THORACOSCOPY W/THERA WEDGE RESEXN INITIAL UNILAT Left 12/03/2014    Procedure: THORACOSCOPY, SURGICAL; WITH THERAPEUTIC WEDGE RESECTION (EG, MASS, NODULE) INITIAL UNILATERAL;  Surgeon: Evert Kohl, MD;  Location: MAIN OR [facility];  Service: Cardiothoracic     Medications reviewed in Epic  Allergies as of 10/21/2022    (No Known Allergies)     Family History Problem Relation Age of Onset    Dementia Mother     Diabetes Father     Diabetes Brother     Diabetes Maternal Aunt     Sarcoidosis Maternal Aunt     Diabetes Maternal Uncle     Diabetes Paternal Aunt     Diabetes Paternal Uncle      Social History     Tobacco Use    Smoking status: Former     Current packs/day: 0.00     Average packs/day: 0.5 packs/day for 9.0 years (4.5 ttl pk-yrs)     Types: Cigarettes     Start date: 05/02/1978     Quit date: 05/03/1987     Years since quitting: 35.5    Smokeless tobacco: Never   Substance Use Topics    Alcohol use: No     Alcohol/week: 0.0 standard drinks of alcohol        Objective:      Physical Exam:  Vitals:    10/25/22 0400 10/25/22 0425 10/25/22 0800 10/25/22 0803   BP: 117/62  105/63 105/63   Pulse: 66 78 83    Resp: 30 26 16     Temp: 37.1 ??C (98.8 ??F)  36.6 ??C (97.9 ??F)    TempSrc: Oral  Oral    SpO2: 92% 97% 96%    Weight:       Height:         General: Alert, well-appearing, and in no distress.  Eyes: Anicteric sclera, conjunctiva clear.  ENT:  Mucous membranes moist and intact.  Lungs: Normal excursion. No wheezes or crackles. Diminished breath sounds.  Cardiovascular: Regular rate and rhythm, S1, S2 normal, no murmur, click, rub or gallop appreciated.  Abdomen: Soft, non-tender, not distended  Musculoskeletal: No clubbing and no synovitis. No edema.  Skin: No rashes or lesions.  Neuro: No focal neurological deficits.    Malnutrition Assessment by RD:  Non-severe (Moderate) Protein-Calorie Malnutrition in the context of chronic illness (10/23/22 1244)       Diagnostic Review:   All labs and images were personally reviewed.    Echo (01/2022): Normal LV systolic function with moderate LVH. Mild RV systolic dysfunction. Trivial MR, trivial PR, trivial TR. Peak TR jet 3.7 m/s (RVSP estimated 57 mmHg).    CT Chest (10/21/2022): No PE. Stable appearance of severe fibrotic disease with upper  lobe volume loss (2/2 lobectomy and cystic bronchiectasis). Bulky mediastinal and hilar LAD, stable.     BNP 37.21 decreased from [place] admission where he was elevated

## 2022-10-25 NOTE — Unmapped (Signed)
Patient rested on 3 lpm large bore nasal canula. Scheduled breathing treatments given. [name] done for airway clearance

## 2022-10-25 NOTE — Unmapped (Signed)
Pt ready for discharge today. Alert, oriented x 4. Ambulatory in room. Afebrile. On baseline home nasal cannula. Denies c/o pain. Tolerating PO without issue. See flowsheet for VS and complete assessments. Reviewed AVS with patient and answered all questions. Personal belongings gathered and sent with patient transport to meet family. Peripheral IV removed, tip intact.    Problem: Adult Inpatient Plan of Care  Goal: Plan of Care Review  Outcome: Discharged to Home  Goal: Patient-Specific Goal (Individualized)  Outcome: Discharged to Home  Goal: Absence of Hospital-Acquired Illness or Injury  Outcome: Discharged to Home  Intervention: Identify and Manage Fall Risk  Recent Flowsheet Documentation  Taken 10/25/2022 0800 by Scherrie Gerlach, RN  Safety Interventions:   fall reduction program maintained   lighting adjusted for tasks/safety   low bed   nonskid shoes/slippers when out of bed   toileting scheduled  Intervention: Prevent Skin Injury  Recent Flowsheet Documentation  Taken 10/25/2022 1000 by Scherrie Gerlach, RN  Positioning for Skin: Supine/Back  Taken 10/25/2022 0800 by Scherrie Gerlach, RN  Positioning for Skin: Supine/Back  Device Skin Pressure Protection:   absorbent pad utilized/changed   pressure points protected   positioning supports utilized   tubing/devices free from skin contact  Skin Protection:   adhesive use limited   cleansing with dimethicone incontinence wipes   incontinence pads utilized   protective footwear used   tubing/devices free from skin contact  Intervention: Prevent Infection  Recent Flowsheet Documentation  Taken 10/25/2022 0800 by Scherrie Gerlach, RN  Infection Prevention:   cohorting utilized   environmental surveillance performed   equipment surfaces disinfected   hand hygiene promoted   personal protective equipment utilized   single patient room provided   rest/sleep promoted   visitors restricted/screened  Goal: Optimal Comfort and Wellbeing  Outcome: Discharged to Home  Goal: Readiness for Transition of Care  Outcome: Discharged to Home  Goal: Rounds/Family Conference  Outcome: Discharged to Home

## 2022-10-25 NOTE — Unmapped (Signed)
Pt received scheduled breathing tx.  Airway clearance done with Brazil.  Currently on 5L large bore, wears 5-8L at home.    Problem: Breathing Pattern Ineffective  Goal: Effective Breathing Pattern  Outcome: Progressing     Problem: Gas Exchange Impaired  Goal: Optimal Gas Exchange  Outcome: Progressing

## 2022-10-25 NOTE — Unmapped (Signed)
Physician Discharge Summary    Admit date: 10/21/2022    Discharge date and time: 10/25/2022    Discharge to: Home    Discharge Service: Med Undesignated (MDX)    Discharge Attending Physician: Edd Fabian, DO    Discharge Diagnoses: severe group ii and v pulmonary htn, ild, sarcoidosis, bronchiectasis     Hospital Course:  Karen Barber is a 57 y.o. female who is presenting to [facility] with Acute on chronic hypoxic respiratory failure (CMS-HCC), in the setting of the following pertinent/contributing co-morbidities: Pulmonary HTN, Sarcoidosis, Chronic pulmonary aspergillosis .     Acute on chronic hypoxic respiratory failure (CMS-HCC) likely due to worsening pulmonary HTN and likely bronchiectasis flare (mixed ILD and history of airway obstruction, bronchiectasis)  Presenting with 3 week progressively worsening DOE, after being treated for pneumonia about one month. Uses 5 to 6L [place] at rest with increasing requirement up to 8-10L with exertion since she became worse. Also reported desaturations to the 70s. Pink sputum production has increased in volume over the past week. CTA chest demonstrated no PE with unchanged fibrotic pulmonary sarcoidosis and upper lobe bronchiectasis that was also unchanged.  The patient appeared euvolemic and was started on her home lasix dose which was 40 mg po BID.  The patient had a lot of secretions which she was coughing up and it felt that due to her lung anatomy, she has significant bronchiectasis and because of that, needed more airway clearance.  Initially was on [place] once a day and hypertonic saline.  However, the patient started to have increased hemoptysis with the hypertonic saline which can be a known effect in patients with fibrosing sarcoidosis per discussion with pulmonology who recommended stopping the hypertonic saline nebulizer treatments.  The [place] treatments were increased to BID with specific instructions to have her nebulizer treatments given prior to the [place] treatments.  The patient was initially started on steroids but unclear benefit with her scarring and in addition, this led to severe hyperglycemia so this was stopped.  The patient per pulmonology recs also was placed on augmentin for a 14 day course with EOT 11/05/2022 for a presumed bronchiectasis flare.  Her oxygen requirements were at her baseline (5-8 liters) and at times even better (was able to get down to 3 liters [place]).  Her SPO2 goal is 88-92%.  She was discharged on 10/25/2022 and was on 5 liters [place] at the time of discharge.      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    Thought to be due to group 2 (has had an elevated wedge on prior RHC) vs group 5. Most recent RHC 10/23 with RA 5, wedge 17, PA 71/30, mean 45. She reported compliance with revatio 20mg  TID and lasix 40mg  BID.  The patient also was net negative with regards to her I?Os throughout her hospital stay.  The patient had a repeat TTE this admission which showed expectedly severe RV dysfunction and a PASP of 91.  However, her O2 requirements improved/were stable and was euvolemic.  She was referred to outpatient pulmonology here (wants to transition from [facility] to here) to follow-up with the pulmonary HTN group for discussion regarding possible future treatments such as but not limited to adempas, veletri, or endothelin-receptor antagonists.     DM II on insulin, exacerbated by steroids   Patient's A1C on 09/25/2022 was 8.3.  The patient required higher doses of her lantus here (on  18 units at home) given she was on prednisone but once that was stopped, her glucoses were much better controlled and was sent home on her home lantus and sliding scale regimen.      Patient's creatinine was at her baseline of around 1.2.         Condition at Discharge: stable  Discharge Medications:      Your Medication List        STOP taking these medications      azithromycin 250 MG tablet  Commonly known as: ZITHROMAX Z-PAK     cyclobenzaprine 10 MG tablet  Commonly known as: FLEXERIL            CHANGE how you take these medications      amoxicillin-clavulanate 875-125 mg per tablet  Commonly known as: AUGMENTIN  Take 1 tablet by mouth every twelve (12) hours for 11 days.  What changed: when to take this            CONTINUE taking these medications      ACCU-CHEK GUIDE GLUCOSE METER Misc  Generic drug: blood-glucose meter  1 Device by Other route Four (4) times a day (before meals and nightly). AS DIRECTED     albuterol 90 mcg/actuation inhaler  Commonly known as: PROVENTIL HFA;VENTOLIN HFA  Inhale 2 puffs every four (4) hours as needed for wheezing or shortness of breath.     albuterol 2.5 mg /3 mL (0.083 %) nebulizer solution  Inhale 3 mL (2.5 mg total) by nebulization every six (6) hours as needed for wheezing or shortness of breath (airway clearance/cough).     amlodipine 5 MG tablet  Commonly known as: NORVASC  Take 1 tablet (5 mg total) by mouth daily. Added per patient     BASAGLAR KWIKPEN U-100 INSULIN 100 unit/mL (3 mL) injection pen  Generic drug: insulin glargine  Inject 0.18 mL (18 Units total) under the skin nightly.     BAYER BACK AND BODY 500-32.5 mg Tab  Generic drug: aspirin-caffeine  Take by mouth. Takes 2 1/2 pills 3-4 times a day as needed.     blood sugar diagnostic Strp  by Other route Three (3) times a day. ACCU-CHEK Guide meter     BREO ELLIPTA 200-25 mcg/dose inhaler  Generic drug: fluticasone furoate-vilanterol  Inhale 1 puff daily.     furosemide 40 MG tablet  Commonly known as: LASIX  Take 1 tablet (40 mg total) by mouth two (2) times a day.     gabapentin 300 MG capsule  Commonly known as: NEURONTIN  Take 1 capsule (300 mg total) by mouth Three (3) times a day.     insulin aspart 100 unit/mL vial  Commonly known as: NovoLOG U-100 Insulin aspart  Take 1-20 units SQ QID AC and HS as directed     ipratropium 0.02 % nebulizer solution  Commonly known as: ATROVENT  Inhale 2.5 mL (500 mcg total) by nebulization four (4) times a day.     OXYGEN-AIR DELIVERY SYSTEMS MISC  Dose: 2-3 LPM     posaconazole 100 mg delayed released tablet  Commonly known as: NOXAFIL  Take 2 tablets by mouth once daily     sildenafiL (pulm.hypertension) 20 mg tablet  Commonly known as: REVATIO  Take 1 tablet (20 mg total) by mouth Three (3) times a day.              Pending Test Results:     Pending Labs       Order  Current Status    (Send out) Vitamin D 1,25 dihydroxy In process    Angiotensin Converting Enzyme In process    Blood Culture #1 Preliminary result    Blood Culture #2 Preliminary result    Lower Respiratory Culture Preliminary result            Discharge Instructions:     Other Instructions       Discharge instructions      You will be sent home with the Aerobika device which was helping to clear up the secretions in your lung.  Please take your albuterol and ipratropium nebulizers before using the [place].  The Waymon Budge is to be used twice a day.  Do not use the hypertonic saline nebulizers anymore.  If you have small amounts of blood when you cough (2 teaspoons or less), stop the [place] for 2 days and then resume if you don't have further bleeding.  If you cough up about a cup of blood in a day, please come to the ED as soon as possible.      You will be on an antibiotic called augmentin twice a day.  Please take it as instructed until you are finished with the antibiotic.      You will likely be contacted by the [facility] Pulmonology team for your follow-up.  If you don't hear from them in the next few days, please call [phone] for the scheduler's office and tell them you need follow-up with the pulmonary hypertension team as well as the bronchiectasis team.        Follow Up instructions and Outpatient Referrals     Discharge instructions        Appointments which have been scheduled for you      Dec 26, 2022 10:20 AM  (Arrive by 10:05 AM)  RETURN CONTINUITY with Loran Senters, FNP  [facility] [address] AT [facility]) [address] [place] [address]  [phone]                I spent greater than 30 minutes in the discharge of this patient.

## 2022-10-26 DIAGNOSIS — D869 Sarcoidosis, unspecified: Principal | ICD-10-CM

## 2022-10-26 DIAGNOSIS — B441 Other pulmonary aspergillosis: Principal | ICD-10-CM

## 2022-10-26 LAB — ANGIOTENSIN CONVERTING ENZYME: ANGIOTENSIN CONVERTING ENZYME: 84 U/L

## 2022-10-26 MED ORDER — ALBUTEROL SULFATE HFA 90 MCG/ACTUATION AEROSOL INHALER
RESPIRATORY_TRACT | 3 refills | 0 days | PRN
Start: 2022-10-26 — End: 2023-10-26

## 2022-10-26 MED ORDER — INSULIN ASPART U-100  100 UNIT/ML SUBCUTANEOUS SOLUTION
1 refills | 0 days
Start: 2022-10-26 — End: 2024-05-19

## 2022-10-26 MED ORDER — BAYER BACK AND BODY 500 MG-32.5 MG TABLET
ORAL | 0 refills | 0 days
Start: 2022-10-26 — End: ?

## 2022-10-26 MED ORDER — POSACONAZOLE 100 MG TABLET,DELAYED RELEASE
ORAL_TABLET | Freq: Every day | ORAL | 0 refills | 0 days
Start: 2022-10-26 — End: ?

## 2022-10-27 MED ORDER — BAYER BACK AND BODY 500 MG-32.5 MG TABLET
ORAL_TABLET | Freq: Four times a day (QID) | ORAL | 1 refills | 0 days | PRN
Start: 2022-10-27 — End: 2023-10-27

## 2022-10-27 MED ORDER — ALBUTEROL SULFATE HFA 90 MCG/ACTUATION AEROSOL INHALER
RESPIRATORY_TRACT | 1 refills | 0 days | Status: CP | PRN
Start: 2022-10-27 — End: 2023-10-27

## 2022-10-27 MED ORDER — INSULIN ASPART U-100  100 UNIT/ML SUBCUTANEOUS SOLUTION
Freq: Four times a day (QID) | SUBCUTANEOUS | 1 refills | 25 days | Status: CP
Start: 2022-10-27 — End: 2023-10-27

## 2022-10-27 NOTE — Unmapped (Signed)
Patient is requesting the following refill  Requested Prescriptions     Pending Prescriptions Disp Refills    aspirin-caffeine (BAYER BACK AND BODY) 500-32.5 mg Tab 300 tablet 1     Sig: Take 2.5 tablets by mouth four (4) times a day as needed. Takes 2 1/2 pills 3-4 times a day as needed.     Signed Prescriptions Disp Refills    albuterol HFA 90 mcg/actuation inhaler 54 g 1     Sig: Inhale 2 puffs every four (4) hours as needed for wheezing or shortness of breath.     Authorizing Provider: Loran Senters     Ordering User: Solmon Bohr    insulin aspart (NOVOLOG U-100 INSULIN ASPART) 100 unit/mL vial 20 mL 1     Sig: Inject 0.01-0.2 mL (1-20 Units total) under the skin Four (4) times a day (before meals and nightly). SLIDING SCALE     Authorizing Provider: Etheleen Nicks [facility]     Ordering User: Barbaraann Boys       Recent Visits  Date Type Provider Dept   09/25/22 Office Visit Loran Senters, FNP [place] [facility] [address] [facility]   06/12/22 Office Visit Loran Senters, FNP [place] [facility] [address] [facility]   05/16/22 Office Visit Loran Senters, FNP [place] [facility] [address] [facility]   Showing recent visits within past 365 days and meeting all other requirements  Future Appointments  Date Type Provider Dept   12/26/22 Appointment Loran Senters, FNP [place] [facility] [address] [facility]   Showing future appointments within next 365 days and meeting all other requirements       Labs: Not applicable this refill

## 2022-10-27 NOTE — Unmapped (Signed)
Medication Requested: posaconazole       Future Appointments   Date Time Provider Department Center   10/31/2022  2:10 PM PFT 1 [facility]   10/31/2022  2:30 PM Despotes, Linwood Dibbles, MD [facility]   11/13/2022  1:10 PM PFT 1 [facility]   11/17/2022  8:30 AM Jettie Booze, MD [facility]   12/26/2022 10:20 AM Loran Senters, FNP [facility]     Per Provider Note:     posaconazole (NOXAFIL) 100 mg delayed released tablet Take 2 tablets (200 mg total) by mouth daily.       Standing order protocol requirements met?: No    Sent to: Provider for signing    Days Supply Given: TBD by provider  Number of Refills: TBD by provider

## 2022-10-30 LAB — SOLUBLE IL-2 RECEPTOR: IL-2 RECEPTOR, SOLUBLE: 662.3 pg/mL

## 2022-10-30 MED ORDER — POSACONAZOLE 100 MG TABLET,DELAYED RELEASE
ORAL_TABLET | Freq: Every day | ORAL | 0 refills | 30 days | Status: CP
Start: 2022-10-30 — End: ?

## 2022-10-30 NOTE — Unmapped (Addendum)
[place] [facility]    Assessment:      Patient:Karen Barber (Dec 19, 1965)  Reason for visit: Karen Barber is a 57 y.o.female who returns for follow-up of bronchiectasis secondary to  sarcoidosis  with asthma and multiple prior infections (aspergillus with cavitary infection s/p LUL lobectomy, prior steno and achromobacter, prior NTM - M fortiuitum), significant pulmonary hypertension on sildenafil  .       Plan:      Problem List Items Addressed This Visit          Cardiovascular and Mediastinum    Pulmonary hypertension (CMS-HCC) (Chronic)       Respiratory    Pulmonary sarcoidosis (CMS-HCC) (Chronic)    Relevant Orders    CBC w/ Differential (Completed)    IgG (Completed)    IgE Total    6 - Minute walk    Bronchiectasis type 1 (CMS-HCC) - Primary (Chronic)    Relevant Orders    CBC w/ Differential (Completed)    IgG (Completed)    IgE Total    6 - Minute walk    Chronic pulmonary aspergillosis (CMS-HCC) (Chronic)    Eosinophilic asthma       Other    Aspergillus fumigatus (CMS-HCC)     Other Visit Diagnoses       Aspergillus (CMS-HCC)        Relevant Orders    Aspergillus Galactomannan Antigen, Serum    Hemoptysis                Sarcoidosis - fibrocavitary, with bronchiectasis, thought to be burned out at this stage (soluble IL-2 not elevated) though prior PET scan in 2020 did show metabolic activity in addition to her fibrotic changes; dx by lip biopsy in 1995; calcified mediastinal LAD on imaging, along with bronchiectasis; was treated with steroids for previously  - She has been referred for evaluation of pulmonary transplant in the past however is not a candidate due to prior PA plasty which precludes her ability to undergo the surgery for lung transplantation her thoracic surgery   - repeat PET scan to evaluate for sarcoidosis activity that may inform treatment with steroid-sparing agent to protect remaining lung tissue     Bronchiectasis, with recurrent infections; has grown achromobacter treated with meropenem in 2016 and 2018), M. Fortuitum (Prior treatment with bactrim + moxi x 7 weeks in 2020 without improvement, thought to be colonized - denies significant esophageal pathology/symptoms), stenotrophomonas (treated with minocycline in 2016)   - see discussion of bronchiectasis management below    Pulmonary aspergillosis, chronic cavitary; s/p LUL in 2016 after episodes of hemoptysis, recurrence in 2018   - continue follow up with [facility] ID, remains on posaconazole - she will reach out to Dr. Kathaleen Grinder, I am happy to place a new referral if needed   - will check serum galactomanan today     Pulmonary hypertension (thought to be group 3 and group 5); Previously also on Tyvaso   - currently on sildenafil 20 mg TID however reports that this was a dose change during recent hospitalization; I will try to clarify with providers who cared for her in the hospital as well as loop in [facility] team (reports previously taking 40 mg TID, which I do see referenced in [facility] notes; dose was increased during 12/2021 admission at [facility] when she presented with worsening hypoxemia, underwent repeat RHC  - remains on diuretics 40 mg lasix BID, with careful monitoring of her weight (baseline weight is about 185  lbs)  - appreciate management of PH Team, Dr. 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, previously on Nucala because was frequently getting steroid courses, doesn't think Nucala helped much but had fewer steroid courses, stopped in 10/2021 but when recently seen by [facility], close attention to eos recommended as this may have been beneficial, low threshold of restarting   - trend eosinophil count, check total IgE today    - hold ICS/LABA for now   - goal of avoidance of steroids may be good reason to pursue biologic again with Nucala    Chronic hypoxemic respiratory failure - likely multifactorial    - continue 4-5L at rest, 6L with exertion   - repeat 6 min walk at next visit       BRONCHIECTAIS MANAGEMENT  Recent exacerbation/immediate plan :  - Continue antibiotics at least 2 weeks - may consider extending longer depending on response  - Hold off on [place] for 1-2 days tl hemoptyis resolves, then restart; ok to continue duonebs for now   - If ongoing hemotpysis despite antibiotics consider repeat CTA,will request prior imaging at [facility] and will also refer to VIR for consideration of angiography given complexity of anatomy on imaging   - she will reach out to Dr. Kathaleen Grinder to arrange ID follow up; get galactomannan today   - Stay off ICS/LABA, but check CBC diff and IgE today - may work towards biologic again in future  - clarify sildenafil dosing if possible, including interactions with posaconazole     Therapies:  Patient did not need to meet with the respiratory therapist today.  For airway clearance, patient would like to use [place].  Continue duonebs with aerobika, avoiding HS for now given blood-tinged secretions during exacerbations, but can consider in future weighing risks/benefits and how effective current clearance ends up being for her     Testing:  Had hemoptysis after PFTs, therefore did not attempt additional sputum induction/collection today (recent culture in hospital with OPF)  If she has future exacerbations, sputum should be sent for bacterial and AFB cultures. The bacterial culture should be processed like a cystic fibrosis sample given the overlapping pathogens. There are standing orders for these cultures in our system.  Blood work including CBCd and IgE related to component of possible asthma, galactomanan related to aspergillus history and ongoing hemoptysis.    Referrals:  Referred to [facility] group as well, has upcoming visit with Dr. Ala Dach    Vaccinations:  Immunization History   Administered Date(s) Administered    COVID-19 VAC,BIVALENT(58YR UP),PFIZER 06/29/2021    COVID-19 VACC,MRNA,(PFIZER)(PF) 06/11/2019, 07/02/2019, 11/21/2019    INFLUENZA INJ MDCK PF, QUAD,(FLUCELVAX)(23MO AND UP EGG FREE) 01/19/2021    INFLUENZA TIV (TRI) PF (IM) 01/02/2005, 12/14/2008    Influenza Vaccine Quad(IM)6 MO-Adult(PF) 11/28/2012, 12/19/2014, 12/16/2015, 01/03/2017, 12/06/2017, 01/08/2019, 12/15/2019, 12/21/2021    PNEUMOCOCCAL POLYSACCHARIDE 23-VALENT 07/08/2007, 01/03/2017    PPD Test 01/19/2015    Pneumococcal Conjugate 20-valent 05/16/2022    TdaP 07/08/2007           Karen Barber will return to clinic in 3 months for follow-up with 6 minute walk.      Patient seen and discussed with Dr. Judeth Porch, MD  [facility] Pediatric Pulmonary Fellow  [facility] Adult Pulmonary and Critical Care Fellow  [phone]       Subjective:      HPI: Karen Barber is a 58 y.o. female who returns for follow-up of bronchiectasis secondary to  sarcoidosis, with recurrent pulmonary infections (aspergillosis) and immunosuppression as part of prior sarcoidosis treatment  with  pulmonary hypertension (likely group 3 and 5) .      History of sarcoidosis, diagnosied by lip biopsy, prior involvement predominantly pulmonary per her report (denies significant skin, joint, liver, eye involvement)  Prior treatment with steroids for several years, had a lot of weight gain (up to 400 lbs) - she ultimately stopped this med and lost weight, wasn't other meds for pulmonary sarcoidosis that she is aware of, and reports her breathing improved with weight loss;     Previously followed with DR. Al-Qadi in 2021 for sarcoidosis, and underwent transplant eval at [facility] - recently followed by [facility] pulmonary fellow clinic, vascular clinic, as well as consultation with asthma clinic in the last year but now re-establishing with [facility] providers    History of chronic 6L oxygen requirement at baseline that is long standing    Hx chronic cavitary pulmonary aspergillosis dx 2010, s/p LUL lobectomy with VATS 12/03/14 - remains on posaconazole and follows with [facility] ID    Asthma hx suspected with eosinophilia prompting use of Nucala in the past; isn't sure if it helped, Stopped taking breo-ellipta - was only on a week - saw that it can cause pneumonia - has been on other ICS in past but wary given issues with infections in past    Recurrent admissions at [facility] for SOB, multiple infections in past, multiple recent admissions    Admission in 12/2021 at [facility] - management with diuresis, uptitration of sildenafil to 40 mg TID and increased diuretic regimen,along with repeat RHC    Admissions in March 2024 to [facility] - management with steroids and bronchodilators, prn HS    Recent admission to [facility] in August 2024 - was hypoxemic, requiring 10L  Felt very short of breath, was coughing up some pink foam but that has since stopped;   Some volume overload suspected initially that quickly resolved with IV diuresis;   Briefly on steroids but these were discontinued given lack of wheezing and significant hyperglycemia after about 3 days  Was already on duonebs, so [place] was added after last admission for clearance - had some hemoptysis that started with HS trial  Treated with augmentin course with the thought that on top of her chronic lung disease, PH getting superimposed infection / bronchiectasis exacerbation really tips her over  Subsequently O2 requirement down to 3L at rest at time of discharge (6L with exertion) - which was the best she had been at in a long time    Today 10/31/22  She reports that she went into the hospital at 20% - and discharged at 90%, and reports today in follow up that she still feels 90%    Since leaving hospital still coughing up blood after uses duonebs with [place] - about half a dime, comes up with when deep cough - usually dark red, sometimes lighter; occurring intermittently, about 3 times a day - has stayed about the same since leaving hospital  Is still taking augmentin, reports being on therapy for about a week; does think shortness of breath is a little less    When gets antibiotics - she feels tremendously better - notices improvement in chest congestion and cough     No chest pain or wheezing; will hear rumbling with albuterol, gets hemoptysis with the aerobika  Using 9L with exertion, takes a deep breath, turn back down; since leaving, is using 4-5L at rest  Uses [employer] - having a hard time getting humidification with flows  No nose bleeds since being in the hospital though, doesn't seem like contributing   Feels like hospital O2 flow delivery with humidification was different    Is taking a new herbal supplement, she's not sure what it is called or how to spell it - she will send me the label     No fever, night sweats,   No dizziness, lightheadedness, syncope/near syncope    No leg swelling, no significant weight gain - 185 lbs is her baseline  Good appetite   Diuretic regimen - Furosemide twice a day - 40 mg BID  Used to take 40 mg sildenafil TID but now only 20 mg since being at [facility]     No rashes, skin changes, vision changes    No reflux/aspiration  No snoring - used to before she lost weight    Respiratory Symptoms:   Cough:  most days, not a whole lot but will bring up mucus (sometimes clear, occasional blood) .    Nocturnal awakenings: absent.  Wheezing: absent.  Chest tightness: absent.  Rescue albuterol use: none.  Pleurisy: absent.  Hemoptysis: present.  MMRC: 3 = I stop for breath after walking about 100 yards or after a few minutes on level ground.    Exacerbations:   Number of exacerbations treated in the past year: 2  Dates of exacerbations: 10/2022  Number of hospitalizations for exacerbations in the past 2 years: 1  Dates of hospitalizations: 10/2022 ([facility], OPF treated with augmentin)    Pulmonary Therapies:   Airway clearance: duonebs, aerobika started during recent admission 10/2022  Inhaled antibiotics: no.  Inhalers: no, previously on ICS/LABA but self discontinued due to concerns about risk of pneumonia  Chronic antibiotics: no  Exercise: limited  Pulmonary Rehab: no  Supplemental oxygen: yes, 6L at rest, up to 10L with exertion at times (currently 4-5L at rest, higher than recent hospital discharge 3L at rest)  NIPPV: no    Review of Systems    Remainder of a complete review of systems was negative unless mentioned above.    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 A.niger from sputum 08/2016-09/2016.    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    Sarcoidosis 1995    Type 2 diabetes mellitus with hyperglycemia (CMS-HCC) 07/27/2014    Visual impairment     glasses       Current Outpatient Medications   Medication Sig Dispense Refill    albuterol 2.5 mg /3 mL (0.083 %) nebulizer solution Inhale 3 mL (2.5 mg total) by nebulization every six (6) hours as needed for wheezing or shortness of breath (airway clearance/cough). 360 mL 3    ipratropium (ATROVENT) 0.02 %  nebulizer solution Inhale 2.5 mL (500 mcg total) by nebulization four (4) times a day. 300 mL 0    ACCU-CHEK GUIDE GLUCOSE METER Misc 1 Device by Other route Four (4) times a day (before meals and nightly). AS DIRECTED 1 kit 0    albuterol HFA 90 mcg/actuation inhaler Inhale 2 puffs every four (4) hours as needed for wheezing or shortness of breath. 54 g 1    amlodipine (NORVASC) 5 MG tablet Take 1 tablet (5 mg total) by mouth daily. Added per patient 90 tablet 3    amoxicillin-clavulanate (AUGMENTIN) 875-125 mg per tablet Take 1 tablet by mouth every twelve (12) hours for 11 days. 22 tablet 0    aspirin-caffeine (BAYER BACK AND BODY) 500-32.5 mg Tab Take by mouth. Takes 2 1/2 pills 3-4 times a day as needed.      blood sugar diagnostic Strp by Other route Three (3) times a day. ACCU-CHEK Guide meter 300 strip 1    BREO ELLIPTA 200-25 mcg/dose inhaler Inhale 1 puff daily.      furosemide (LASIX) 40 MG tablet Take 1 tablet (40 mg total) by mouth two (2) times a day.      gabapentin (NEURONTIN) 300 MG capsule Take 1 capsule (300 mg total) by mouth Three (3) times a day. 270 capsule 3    insulin aspart (NOVOLOG U-100 INSULIN ASPART) 100 unit/mL vial Inject 0.01-0.2 mL (1-20 Units total) under the skin Four (4) times a day (before meals and nightly). SLIDING SCALE 20 mL 1    insulin glargine (BASAGLAR KWIKPEN U-100 INSULIN) 100 unit/mL (3 mL) injection pen Inject 0.18 mL (18 Units total) under the skin nightly. 15 mL 0    OXYGEN-AIR DELIVERY SYSTEMS MISC Dose: 2-3 LPM      posaconazole (NOXAFIL) 100 mg delayed released tablet Take 2 tablets by mouth once daily 60 tablet 0    sildenafiL, pulm.hypertension, (REVATIO) 20 mg tablet Take 1 tablet (20 mg total) by mouth Three (3) times a day. 90 tablet 0     No current facility-administered medications for this visit.       Allergies  Reviewed on 10/26/2022   No Known Allergies         Social History     Tobacco Use    Smoking status: Former     Current packs/day: 0.00     Average packs/day: 0.5 packs/day for 9.0 years (4.5 ttl pk-yrs)     Types: Cigarettes     Start date: 05/02/1978     Quit date: 05/03/1987     Years since quitting: 35.5    Smokeless tobacco: Never   Vaping Use    Vaping status: Never Used   Substance Use Topics    Alcohol use: No     Alcohol/week: 0.0 standard drinks of alcohol    Drug use: No     Sarcoidosis runs in her family, 9-10 in her family have it  Not sure about PH         Objective:   Objective   BP 125/73 (BP Site: L Arm, BP Position: Sitting, BP Cuff Size: Medium)  - Pulse 79  - Wt 84.8 kg (187 lb)  - LMP  (LMP Unknown)  - SpO2 92%  - BMI 26.08 kg/m??   General Appearance:   Very pleasent female appearing comfortable, non-toxic, in no distress, and stated age. Seated in wheelchair.   Nose: Nares normal, septum midline. Normal mucosa.  [place] in place.   Oropharynx: Moist mucus  membranes without lesions or thrush.  Posterior pharynx clear.   Respiratory:   RIght lung base predominant crackles, otherwise no wheezing and good air movement; Easy work of breathing without accessory muscle use.     Cardiovascular:  Regular rate and rhythm. No peripheral cyanosis or edema.   Musculoskeletal: No tenderness or deformity of the chest wall. Joints normal. Fake nails making assessment of clubbing difficult   Skin: No rashes or skin changes.    Heme/Lymph: No cervical, supraclavicular, submandibular, or suprasternal adenopathy.  No bruising or petechiae.   Neurologic: Alert and oriented x3. No focal neurological deficits.      Diagnostic Review:     Pulmonary Function Testing:       Spirometry measures 10/31/22 measures are suggestive of severe restriction (recommend formal lung volumes) and are  similar to recent testing through [facility] on 12/21/21 .     FEV1 (% predicted) FVC (% predicted) FEV1/FVC   10/31/22 1.17, 41% 1.54 41%      [facility] PFTs 12/21/21: Severe restrictive lung disease with severe diffusion impairment.   Lung volumes at [facility] 12/21/21: TLC 2.84, 44% pred, RV 1.30 57% pred,   DLCO severely reduced     Cultures:      Source Bacterial Culture AFB Smear AFB Culture   10/24/22 sputum OPF                     See ID note from 11/16/21 for summarization of prior infections    Radiology:     CTA 10/21/22  PULMONARY ARTERIES: No emboli in either lung. Main pulmonary artery is enlarged, measuring 4.3 cm cm.    HEART AND VASCULATURE: Cardiac chambers are normal in size. No pericardial effusion. Aorta is normal in caliber.    LUNGS, AIRWAYS, AND PLEURA: Apical upper lobe predominant segmental and subsegmental fibrosis with varicoid and cystic bronchiectasis. Segmental and subsegmental fibrosis in the right middle lobe and bibasilar lower lobes. No air-fluid level or consolidation. No active extravasation or pseudoaneurysm. Central airways are patent. No pleural effusion or pneumothorax.    MEDIASTINUM AND LYMPH NODES: Unchanged bulky eggshell calcified right paratracheal, prevascular mediastinal, para-aortic/AP window and subcarinal mediastinal lymphadenopathy. Unchanged bulky axial calcified bilateral hilar lymphadenopathy. Unchanged paraesophageal eggshell calcified lymph nodes.    CHEST WALL AND BONES: No suspicious lytic or sclerotic osseous lesions. Thoracic spine appears normal. Chest wall appears normal.    UPPER ABDOMEN: Visualized upper abdomen appears normal.    OTHER: Visualized thyroid appears normal. Unchanged supraclavicular lymphadenopathy. No axillary lymphadenopathy    Impression  1.  No pulmonary emboli or evidence of pulmonary hemorrhage.  2.  Unchanged fibrotic pulmonary sarcoidosis with biapical upper lobe cystic bronchiectasis and scattered segmental and subsegmental fibrosis with bronchiectasis.  3.  Unchanged bulky eggshell calcified mediastinal and hilar lymphadenopathy compatible with pulmonary sarcoidosis.    Echo 10/23/22  Summary    1. The left ventricle is normal in size with mildly increased wall  thickness.    2. The left ventricular systolic function is normal, LVEF is visually  estimated at > 55%.    3. There is severe pulmonary hypertension.    4. The right ventricle is severely dilated in size, with mildly reduced  systolic function.    5. The right atrium is mildly dilated in size.    6. The ascending aorta is mildly dilated.    Cardiac evaluations:  Prior RHC  03/02/2020 showed elevated mean PA pressures with a response to nitric oxide (59 mmHg  to 50 mmHg) was already on sildenafil   Baseline (on 3L [place])   RA mean 14   RV systolic 88   PA mean 59   Wedge 11   Output 5.5 / Index 2.4   PVR 8.7     With 40ppm NO @5  mins:   RA mean 11   RV systolic 81   PA mean 50   Wedge 11   Output 5.9 / Index 2.6   PVR 6.6       Repeat RHC on 01/04/22 that showed a mean PA pressure of 45 which is lower than her previous RHC which was 50.   Right Heart Catheterization 01/04/22  State: Baseline  RA: 8 mmHg (mean)  RV: 75/ 10 mmHg  PA: 75/ 34 45 mmHg (mean)  PCW: 16 mmHg (mean)  AV O2: 5.1 vol%  Cardiac output: 4.9 L/min  Cardiac index: 2.3 L/min-m2  PVR: 5.9 Wood units    Bronchiectasis Evaluation   IgG with subclasses:   Lab Results   Component Value Date/Time    IGG 1,374 10/31/2022 03:38 PM     IgA: No results found for: IGA  IgM: No results found for: IGM  IgE:   Lab Results   Component Value Date/Time    IGE 18.5 04/25/2018 10:13 AM     Specific Titers: No results found for: TETANUSAB, TETGV, DIPHTERIAAB, DIPGV  S. pneumonia titers (none available)  HIV:   Lab Results   Component Value Date/Time    HIV Nonreactive 09/25/2016 10:39 AM     Autoimmune: No results found for: ANA, ANATITER1, PAT1, ANATITER2, PAT2, DSDNAAB, ENAS, RF, CCPAB, CCPIGG  ANCA: No results found for: ANCA, IFA, PR3ELISA, PR3QT, MPO, MPOQT  CBC-D:   Lab Results   Component Value Date/Time    WBC 7.2 10/31/2022 03:38 PM    WBC 8.3 07/03/2014 04:02 AM    HGB 11.0 (L) 10/31/2022 03:38 PM    HGB 11.5 (L) 04/29/2019 12:10 PM    HGB 10.8 (L) 02/13/2018 06:46 PM    HCT 35.2 10/31/2022 03:38 PM    HCT 27.7 (L) 07/03/2014 04:02 AM    PLT 281 10/31/2022 03:38 PM    PLT 343 07/03/2014 04:02 AM    NEUTROABS 4.7 10/31/2022 03:38 PM    NEUTROABS 13.1 (H) 06/29/2014 12:20 AM    LYMPHSABS 1.1 10/31/2022 03:38 PM    LYMPHSABS 1.7 06/29/2014 12:20 AM    EOSABS 0.3 10/31/2022 03:38 PM    EOSABS 0.7 (H) 06/29/2014 12:20 AM    BASOSABS 0.1 10/31/2022 03:38 PM    BASOSABS 0.1 06/29/2014 12:20 AM     Alpha-1 level: No results found for: A1TRYP  Alpha-1 genotype none available  CF Sweat test: No results found for: AMTLFT, AMTRT, SWCLL, SWCLR  CF Genetics (none available)  PCD testing: (none available)    Immunization History   Administered Date(s) Administered    COVID-19 VAC,BIVALENT(39YR UP),PFIZER 06/29/2021    COVID-19 VACC,MRNA,(PFIZER)(PF) 06/11/2019, 07/02/2019, 11/21/2019    INFLUENZA INJ MDCK PF, QUAD,(FLUCELVAX)(53MO AND UP EGG FREE) 01/19/2021    INFLUENZA TIV (TRI) PF (IM) 01/02/2005, 12/14/2008    Influenza Vaccine Quad(IM)6 MO-Adult(PF) 11/28/2012, 12/19/2014, 12/16/2015, 01/03/2017, 12/06/2017, 01/08/2019, 12/15/2019, 12/21/2021    PNEUMOCOCCAL POLYSACCHARIDE 23-VALENT 07/08/2007, 01/03/2017    PPD Test 01/19/2015    Pneumococcal Conjugate 20-valent 05/16/2022    TdaP 07/08/2007

## 2022-10-31 ENCOUNTER — Ambulatory Visit: Admit: 2022-10-31 | Discharge: 2022-11-01 | Payer: MEDICARE

## 2022-10-31 DIAGNOSIS — D860 Sarcoidosis of lung: Principal | ICD-10-CM

## 2022-10-31 DIAGNOSIS — J479 Bronchiectasis, uncomplicated: Principal | ICD-10-CM

## 2022-10-31 DIAGNOSIS — B449 Aspergillosis, unspecified: Principal | ICD-10-CM

## 2022-10-31 LAB — CBC W/ AUTO DIFF
BASOPHILS ABSOLUTE COUNT: 0.1 10*9/L (ref 0.0–0.1)
BASOPHILS RELATIVE PERCENT: 0.9 %
EOSINOPHILS ABSOLUTE COUNT: 0.3 10*9/L (ref 0.0–0.5)
EOSINOPHILS RELATIVE PERCENT: 4.8 %
HEMATOCRIT: 35.2 % (ref 34.0–44.0)
HEMOGLOBIN: 11 g/dL — ABNORMAL LOW (ref 11.3–14.9)
LYMPHOCYTES ABSOLUTE COUNT: 1.1 10*9/L (ref 1.1–3.6)
LYMPHOCYTES RELATIVE PERCENT: 15.2 %
MEAN CORPUSCULAR HEMOGLOBIN CONC: 31.1 g/dL — ABNORMAL LOW (ref 32.0–36.0)
MEAN CORPUSCULAR HEMOGLOBIN: 25.1 pg — ABNORMAL LOW (ref 25.9–32.4)
MEAN CORPUSCULAR VOLUME: 80.6 fL (ref 77.6–95.7)
MEAN PLATELET VOLUME: 8.6 fL (ref 6.8–10.7)
MONOCYTES ABSOLUTE COUNT: 1 10*9/L — ABNORMAL HIGH (ref 0.3–0.8)
MONOCYTES RELATIVE PERCENT: 13.4 %
NEUTROPHILS ABSOLUTE COUNT: 4.7 10*9/L (ref 1.8–7.8)
NEUTROPHILS RELATIVE PERCENT: 65.7 %
PLATELET COUNT: 281 10*9/L (ref 150–450)
RED BLOOD CELL COUNT: 4.37 10*12/L (ref 3.95–5.13)
RED CELL DISTRIBUTION WIDTH: 15.4 % — ABNORMAL HIGH (ref 12.2–15.2)
WBC ADJUSTED: 7.2 10*9/L (ref 3.6–11.2)

## 2022-10-31 LAB — IGG: GAMMAGLOBULIN; IGG: 1374 mg/dL (ref 650–1600)

## 2022-10-31 NOTE — Unmapped (Signed)
It was so nice to meet you today!    We are glad you are still feeling better from being in the hospital - we need to keep a close eye on the amount of blood you are coughing up  Continue augmentin - stop using [place] for the next 24-48  - see if coughing up blood quiets down - if it's gone can restart; keep doing neb treatments  If blood is increasing let me know      And as we get to end of 14 day course of antibiotics - let's touch base and discuss if we need to extend antiboitcs; will then discuss need for repeat CT scan    Labs today - will let you know results - discussed potential biologic like nucala again    Make an appointment with Dr. Kathaleen Grinder to follow up in ID clinic    Will discuss sildenafil dosing with Dr. Ala Dach and Dr. Allena Katz -  keep up the good work with monitoring your weight    Symptoms of a bronchiectasis exacerbation: 48 hours of at least two of the following:  Increased cough  Change in volume or appearance of sputum  Increased sputum purulence  Worsening shortness of breath and/or exercise tolerance  Fatigue and/or malaise  Coughing up blood (hemoptysis)    If you are experiencing some of these symptoms:  Increase the frequency and/or intensity of your airway clearance  Try to submit a sputum sample for bacterial and AFB cultures (at [facility] or locally)  Reach out to me or your local physician.  If you need antibiotics, recommend treating for 14 days.      Please bring any new airway clearance equipment to your next visit to ensure that you are using and caring for it properly.    Thank you for allowing me to be a part of your care. Please call the clinic with any questions.    Between appointments, you can reach [place] at these numbers:  For appointments: [phone]  For my nurse, Alvino Blood: [phone]  Fax: [phone]  For urgent issues after hours: Hospital Operator @ [phone] & ask for Pulmonary Fellow on call    My [facility] Chart is for non-urgent messages. This means you have a simple medical question that does not require an immediate response.     If you need immediate attention, call 911.     Responses may take up to 3 business days. Your message will be read by your provider or another medical team member who may respond on your provider???s behalf.    Some questions cannot be answered through messages in My [facility] Chart. Depending on your question, your provider???s office may ask you to schedule an appointment.     Information sent through My [facility] Chart will become part of your medical record.    For further information, check out the websites below:    General Information:  [facility] Bronchiectasis/NTM Care and Research Center: [email or web address]  Information about bronchiectasis (BE): Speak Up In BE - Page for People With Bronchiectasis ([email or web address].com)   Information about Non-tuberculous Mycobacterial Infections:  https://www.ntminfo.org   Videos from the patient session of the World Bronchiectasis Conference in [place] on September 30, 2016 can be watched here (TrustyNews.es).   Online Community for Individuals with Bronchiectasis and/or NTM through the Bronchiectasis Registry and the COPD Foundation: https://www.mckee-young.org/    Airway Clearance, Exercise, and Care:  Impact Airway Clearance Education: http://www.impact-be.com  Bronchiectasis Toolbox (information about airway clearance): DiningCalendar.de  Exercise program  by [facility] [place]: http://www.brown.com/  Be Clear with Bronchiectasis by Toniann Ket, MPH (health educator and patient): https://www.letsbecleartoday.com/    Research Opportunities:  Interested in our bronchiectasis and NTM clinical trials: https://go.ToyArticles.ca      Interested in other clinical trials and research opportunities?  www.clinicaltrials.gov  The Rare Diseases Clinical Research Network [facility]) Genetic Disorders of Mucociliary Clearance Consortium [facility]) Contact Registry is a way for patients with disorders of mucociliary clearance (such as bronchiectasis) and their family members to learn about research studies they may be able to join. Participation is completely voluntary and you may choose to withdraw at any time. There is no cost to join the [employer]. For more information or to join the registry please go to the following website:  [email or web address].hu

## 2022-11-01 DIAGNOSIS — I272 Pulmonary hypertension, unspecified: Principal | ICD-10-CM

## 2022-11-01 LAB — BASIC METABOLIC PANEL
ANION GAP: 8 mmol/L (ref 5–14)
BLOOD UREA NITROGEN: 23 mg/dL (ref 9–23)
BUN / CREAT RATIO: 15
CALCIUM: 9.9 mg/dL (ref 8.7–10.4)
CHLORIDE: 99 mmol/L (ref 98–107)
CO2: 34 mmol/L — ABNORMAL HIGH (ref 20.0–31.0)
CREATININE: 1.57 mg/dL — ABNORMAL HIGH
EGFR CKD-EPI (2021) FEMALE: 38 mL/min/{1.73_m2} — ABNORMAL LOW (ref >=60)
GLUCOSE RANDOM: 127 mg/dL (ref 70–179)
POTASSIUM: 5.1 mmol/L — ABNORMAL HIGH (ref 3.4–4.8)
SODIUM: 141 mmol/L (ref 135–145)

## 2022-11-01 LAB — VITAMIN D 1,25 DIHYDROXY: VITAMIN D 1,25-DIHYDROXY: 32 pg/mL

## 2022-11-01 LAB — ASPERGILLUS GALACTOMANNAN ANTIGEN, SERUM: ASPERGILLUS AG INTERP: NEGATIVE (ref <0.5)

## 2022-11-01 NOTE — Unmapped (Signed)
Addended by: Heide Scales on: 11/01/2022 03:26 PM     Modules accepted: Orders

## 2022-11-02 MED ORDER — BAYER BACK AND BODY 500 MG-32.5 MG TABLET
ORAL_TABLET | Freq: Four times a day (QID) | ORAL | 1 refills | 0 days | PRN
Start: 2022-11-02 — End: 2023-11-02

## 2022-11-03 NOTE — Unmapped (Addendum)
Called patient x2 and left VM - will try to reach her again at later time  Called to discuss sildenafil dosing, which appears to have been inadvertently changed from 40 mg TID (which she had been taking since ~12/2021 admission at [facility]) to 20 mg TID during recent admission - previously tolerating this dose well  Discussed case with pharmacy, [facility] team - will discuss with patient when I reach her, but plan will be to increase back up to 40 mg TID, of note she is also on posaconazole which she has been for some time including when on 40 mg TID;  posaconazole may increase sildenafil levels - will plan to have her remain on posa til we get ID clarification, and have her watch for headaches/hypotension and to return to 20 mg TID if she notices those symptoms and to call us ASAP   Also will discuss additional recommendations for PET CT to assess sarcoid activity, and referral to VIR for consideration of embolization, as she was continuing to have low level hemoptysis in clinic this past week

## 2022-11-04 NOTE — Unmapped (Signed)
I saw and evaluated the patient, participating in the key portions of the service.  I reviewed the resident’s note.  I agree with the resident’s findings and plan. Mckenzee Beem N Sohail Capraro, MD

## 2022-11-05 ENCOUNTER — Ambulatory Visit: Admit: 2022-11-05 | Payer: MEDICARE

## 2022-11-05 ENCOUNTER — Ambulatory Visit
Admit: 2022-11-05 | Discharge: 2022-11-12 | Disposition: A | Discharge status: Home Health | Payer: MEDICARE | Admitting: Internal Medicine

## 2022-11-05 LAB — LACTATE, VENOUS, WHOLE BLOOD: LACTATE BLOOD VENOUS: 1.3 mmol/L (ref 0.5–1.8)

## 2022-11-05 LAB — BLOOD GAS, VENOUS
BASE EXCESS VENOUS: 13.8 — ABNORMAL HIGH (ref -2.0–2.0)
HCO3 VENOUS: 34 mmol/L — ABNORMAL HIGH (ref 22–27)
O2 SATURATION VENOUS: 20.7 % — ABNORMAL LOW (ref 40.0–85.0)
PCO2 VENOUS: 68 mmHg (ref 40–60)
PH VENOUS: 7.37 (ref 7.32–7.43)
PO2 VENOUS: 17 mmHg — ABNORMAL LOW (ref 35–40)

## 2022-11-05 LAB — CBC W/ AUTO DIFF
BASOPHILS ABSOLUTE COUNT: 0 10*9/L (ref 0.0–0.1)
BASOPHILS RELATIVE PERCENT: 0.4 %
EOSINOPHILS ABSOLUTE COUNT: 0.2 10*9/L (ref 0.0–0.5)
EOSINOPHILS RELATIVE PERCENT: 1.3 %
HEMATOCRIT: 30.5 % — ABNORMAL LOW (ref 34.0–44.0)
HEMOGLOBIN: 9.9 g/dL — ABNORMAL LOW (ref 11.3–14.9)
LYMPHOCYTES ABSOLUTE COUNT: 0.8 10*9/L — ABNORMAL LOW (ref 1.1–3.6)
LYMPHOCYTES RELATIVE PERCENT: 6.5 %
MEAN CORPUSCULAR HEMOGLOBIN CONC: 32.6 g/dL (ref 32.0–36.0)
MEAN CORPUSCULAR HEMOGLOBIN: 26.2 pg (ref 25.9–32.4)
MEAN CORPUSCULAR VOLUME: 80.3 fL (ref 77.6–95.7)
MEAN PLATELET VOLUME: 8.9 fL (ref 6.8–10.7)
MONOCYTES ABSOLUTE COUNT: 1.3 10*9/L — ABNORMAL HIGH (ref 0.3–0.8)
MONOCYTES RELATIVE PERCENT: 10 %
NEUTROPHILS ABSOLUTE COUNT: 10.3 10*9/L — ABNORMAL HIGH (ref 1.8–7.8)
NEUTROPHILS RELATIVE PERCENT: 81.8 %
NUCLEATED RED BLOOD CELLS: 0 /100{WBCs} (ref <=4)
PLATELET COUNT: 221 10*9/L (ref 150–450)
RED BLOOD CELL COUNT: 3.8 10*12/L — ABNORMAL LOW (ref 3.95–5.13)
RED CELL DISTRIBUTION WIDTH: 15.3 % — ABNORMAL HIGH (ref 12.2–15.2)
WBC ADJUSTED: 12.6 10*9/L — ABNORMAL HIGH (ref 3.6–11.2)

## 2022-11-05 LAB — COMPREHENSIVE METABOLIC PANEL
ALBUMIN: 3.3 g/dL — ABNORMAL LOW (ref 3.4–5.0)
ALKALINE PHOSPHATASE: 84 U/L (ref 46–116)
ALT (SGPT): <7 U/L — ABNORMAL LOW (ref 10–49)
ANION GAP: 3 mmol/L — ABNORMAL LOW (ref 5–14)
AST (SGOT): 9 U/L (ref <=34)
BILIRUBIN TOTAL: 1.1 mg/dL (ref 0.3–1.2)
BLOOD UREA NITROGEN: 23 mg/dL (ref 9–23)
BUN / CREAT RATIO: 16
CALCIUM: 9.2 mg/dL (ref 8.7–10.4)
CHLORIDE: 98 mmol/L (ref 98–107)
CO2: 34.9 mmol/L — ABNORMAL HIGH (ref 20.0–31.0)
CREATININE: 1.45 mg/dL — ABNORMAL HIGH
EGFR CKD-EPI (2021) FEMALE: 42 mL/min/{1.73_m2} — ABNORMAL LOW (ref >=60)
GLUCOSE RANDOM: 224 mg/dL — ABNORMAL HIGH (ref 70–179)
POTASSIUM: 3.8 mmol/L (ref 3.4–4.8)
PROTEIN TOTAL: 7.2 g/dL (ref 5.7–8.2)
SODIUM: 136 mmol/L (ref 135–145)

## 2022-11-05 LAB — HIGH SENSITIVITY TROPONIN I - SINGLE: HIGH SENSITIVITY TROPONIN I: 10 ng/L (ref <=34)

## 2022-11-05 LAB — BASIC METABOLIC PANEL
ANION GAP: 5 mmol/L (ref 5–14)
BLOOD UREA NITROGEN: 30 mg/dL — ABNORMAL HIGH (ref 9–23)
BUN / CREAT RATIO: 20
CALCIUM: 8.5 mg/dL — ABNORMAL LOW (ref 8.7–10.4)
CHLORIDE: 97 mmol/L — ABNORMAL LOW (ref 98–107)
CO2: 32.2 mmol/L — ABNORMAL HIGH (ref 20.0–31.0)
CREATININE: 1.49 mg/dL — ABNORMAL HIGH
EGFR CKD-EPI (2021) FEMALE: 40 mL/min/{1.73_m2} — ABNORMAL LOW (ref >=60)
GLUCOSE RANDOM: 618 mg/dL (ref 70–179)
POTASSIUM: 4.4 mmol/L (ref 3.4–4.8)
SODIUM: 134 mmol/L — ABNORMAL LOW (ref 135–145)

## 2022-11-05 LAB — PHOSPHORUS: PHOSPHORUS: 2.7 mg/dL (ref 2.4–5.1)

## 2022-11-05 LAB — MAGNESIUM
MAGNESIUM: 1.6 mg/dL (ref 1.6–2.6)
MAGNESIUM: 1.6 mg/dL (ref 1.6–2.6)

## 2022-11-05 LAB — B-TYPE NATRIURETIC PEPTIDE: B-TYPE NATRIURETIC PEPTIDE: 78.5 pg/mL (ref <=100)

## 2022-11-05 MED ADMIN — furosemide (LASIX) injection 20 mg: 20 mg | INTRAVENOUS | @ 18:00:00 | Stop: 2022-11-05

## 2022-11-05 MED ADMIN — piperacillin-tazobactam (ZOSYN) 3.375 g in sodium chloride 0.9 % (NS) 100 mL IVPB-MBP: 3.375 g | INTRAVENOUS | @ 18:00:00 | Stop: 2022-11-10

## 2022-11-05 MED ADMIN — methylPREDNISolone sodium succinate (PF) (SOLU-Medrol) injection 125 mg: 125 mg | INTRAVENOUS | @ 16:00:00 | Stop: 2022-11-05

## 2022-11-05 MED ADMIN — ipratropium-albuterol (DUO-NEB) 0.5-2.5 mg/3 mL nebulizer solution 3 mL: 3 mL | RESPIRATORY_TRACT | @ 16:00:00 | Stop: 2022-11-05

## 2022-11-05 MED ADMIN — azithromycin (ZITHROMAX) 500 mg in sodium chloride (NS) 0.9 % 250 mL IVPB-vialmate: 500 mg | INTRAVENOUS | @ 17:00:00 | Stop: 2022-11-05

## 2022-11-05 MED ADMIN — cefTRIAXone (ROCEPHIN) 2 g in sodium chloride 0.9 % (NS) 100 mL IVPB-MBP: 2 g | INTRAVENOUS | @ 16:00:00 | Stop: 2022-11-05

## 2022-11-05 MED ADMIN — insulin lispro (HumaLOG) injection 0-20 Units: 0-20 [IU] | SUBCUTANEOUS | @ 20:00:00

## 2022-11-05 MED ADMIN — gabapentin (NEURONTIN) capsule 200 mg: 200 mg | ORAL | @ 18:00:00

## 2022-11-05 MED ADMIN — piperacillin-tazobactam (ZOSYN) 3.375 g in sodium chloride 0.9 % (NS) 100 mL IVPB-MBP: 3.375 g | INTRAVENOUS | Stop: 2022-11-10

## 2022-11-05 MED ADMIN — furosemide (LASIX) injection 60 mg: 60 mg | INTRAVENOUS | @ 18:00:00 | Stop: 2022-11-05

## 2022-11-05 MED ADMIN — budesonide (PULMICORT) nebulizer solution 0.5 mg: .5 mg | RESPIRATORY_TRACT | @ 19:00:00

## 2022-11-05 MED ADMIN — ondansetron (ZOFRAN) injection 4 mg: 4 mg | INTRAVENOUS | @ 21:00:00

## 2022-11-05 MED ADMIN — sildenafiL (pulm.hypertension) (REVATIO) tablet 40 mg: 40 mg | ORAL | @ 19:00:00

## 2022-11-05 MED ADMIN — acetaminophen (TYLENOL) tablet 650 mg: 650 mg | ORAL | @ 19:00:00

## 2022-11-05 MED ADMIN — vancomycin (VANCOCIN) 1500 mg in sodium chloride (NS) 0.9% 500 mL IVPB: 1500 mg | INTRAVENOUS | @ 19:00:00 | Stop: 2022-11-10

## 2022-11-05 MED ADMIN — arformoterol (BROVANA) nebulizer solution 15 mcg/2 mL: 15 ug | RESPIRATORY_TRACT | @ 19:00:00

## 2022-11-05 MED ADMIN — lactated ringers bolus 1,000 mL: 1000 mL | INTRAVENOUS | @ 16:00:00 | Stop: 2022-11-05

## 2022-11-05 MED ADMIN — furosemide (LASIX) injection 80 mg: 80 mg | INTRAVENOUS | Stop: 2022-11-07

## 2022-11-05 MED ADMIN — ipratropium-albuterol (DUO-NEB) 0.5-2.5 mg/3 mL nebulizer solution 3 mL: 3 mL | RESPIRATORY_TRACT | @ 19:00:00

## 2022-11-05 NOTE — Unmapped (Addendum)
Vancomycin Therapeutic Monitoring Pharmacy Note    Karen Barber is a 56 y.o. female starting vancomycin. Date of therapy initiation: 08/18    Indication: Pneumonia    Prior Dosing Information: None/new initiation     Goals:  Therapeutic Drug Levels  Vancomycin trough goal: 10-15 mg/L    Additional Clinical Monitoring/Outcomes  Renal function, volume status (intake and output)    Results: Not applicable    Wt Readings from Last 1 Encounters:   11/05/22 86.8 kg (191 lb 5.8 oz)     Creatinine   Date Value Ref Range Status   11/05/2022 1.45 (H) 0.55 - 1.02 mg/dL Final   16/12/9602 5.40 (H) 0.55 - 1.02 mg/dL Final   98/01/9146 8.29 (H) 0.55 - 1.02 mg/dL Final        Pharmacokinetic Considerations and Significant Drug Interactions:  Adult (estimated initial): Vd = 61.6 L, ke = 0.04381 hr-1  Concurrent nephrotoxic meds: not applicable    Assessment/Plan:  Recommendation(s)  Start vancomycin 1500 mg IV q 24 hours  Estimated trough on recommended regimen:  14 mg/L    Follow-up  Level due: in 3-5 days  A pharmacist will continue to monitor and order levels as appropriate    Please page service pharmacist with questions/clarifications.    Burnard Bunting, PharmD

## 2022-11-05 NOTE — Unmapped (Signed)
The patient reports experiencing productive coughing with brownish mucus and shortness of breath for the past two days. She mentions that she was seen here over a week ago for a lung infection. H/o COPD and sarcoidosis, with her home oxygen levels being at 6 liters and currently increased to 8 liters.

## 2022-11-05 NOTE — Unmapped (Signed)
11/05/22 1357   Vitals   Heart Rate 88   Resp 17   SpO2 94 %   Oxygen Therapy/Pulse Ox   O2 Device Large bore nasal cannula (cool high flow)   O2 Therapy Oxygen humidified   O2 Flow Rate (L/min) (S)  11 L/min   $$ Pulse Oximetry Charges Continuous     Patient arrived to unit on large bore

## 2022-11-05 NOTE — Unmapped (Signed)
[facility]  Emergency Department Provider Note     ED Clinical Impression     Final diagnoses:   None      Impression, Medical Decision Making, ED Course     Impression: 57 y.o. 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 who presents with 3 weeks of progressively worsening DOE after recent admission as described below. She was recently admitted for pneumonia, bronchiectasis flare, sarcoidosis, and pulmonary HTN and has been following with PCP and pulmonology in the outpatient setting since then. She recently completed course of Augmentin after hospital discharge on 8/7 but states she typically requires at least two antibiotics for pulmonary infections. On exam, patient is alert and oriented but reporting she feels feverish, fatigued. Exam is remarkable for L sided crackles, end expiratory wheezing, no accessory muscle use. Trace bilateral lower extremity edema which patient states is close to her baseline.     DDx/MDM: History and physical most concerning for recurrent pneumonia with possible sepsis, bacteremia. Differential also includes sarcoidosis, bronchiectasis flair, pulmonary hypertension, CHF although no overt volume overload.    Diagnostic workup as below.   Will initiate sepsis workup, LV EF preserved based on most recent echo . Patient currently febrile, ill appearing, noting some improvement in shortness of breath since presentation to the ED but not currently a good historian as she states she feels very sick. Discussed case with attending physician Jonal, MD.   Due to concern for sepsis related to pneumonia, will initiate IVF, IV rocephin, azithromycin. Will initiate duo nebulizer, methylprednisolone due to current shortness of breath. CXR, EKG. Will also obtain cardiac workup due to reported L sided chest pain. I have placed her on 6L in the ED despite her using 10L over the past 2 days. She has been stable in the ED on 6L with O2 sat at 98-99% and will continue to monitor on cardiac and O2 monitor.   Leukocytosis of 12.6, glucose 224, pCO2 venous 68, pH 7.37, BNP and lactate WNL, troponin 10, lactate and mag/phos WNL. Awaiting results of RPP. 1L fluids administered from 1130-1330 per nursing staff as they were briefly held for abx administration. Was not infused rapidly despite sepsis order. See MAR.  MAO paged for admission for further management due to current evidence of infection and increasing O2 requirement.     Orders Placed This Encounter   Procedures    Sequential compression device unit    Blood Culture #1    Blood Culture #2    Respiratory Pathogen Panel    MRSA Screen    Lower Respiratory Culture    AFB culture    XR Chest 2 views    CT Chest Wo Contrast    Phosphorus Level    Magnesium Level    hsTroponin I (single, no delta)    B-type Natriuretic Peptide    Lactate, Venous, Whole Blood    Blood Gas, Venous    CBC w/ Differential    Comprehensive metabolic panel    Basic Metabolic Panel    CBC    Cystatin C    Nutrition Therapy Regular/House    Monitor    Oxygen sat - continuous monitoring    Vital signs    Notify Provider  Notify Provider    Measure height    Weigh patient    Remote Tech Telemetry Monitoring    Incentive Spirometry    Notify Provider    Oxygen sat continuous monitoring    Place sequential compression device    Full Code    Inpatient consult to Pharmacy RX to dose: vancomycin    Inpatient consult to Pulmonology    Advance diet as tolerated    Occupational Therapy Eval and Treat    Physical Therapy Eval and Treat    Airway clearance per Respiratory Therapy    Adult BiPAP ECG 12 Lead    Insert peripheral IV    Place Patient in Bed            MDM Elements                ____________________________________________    The case was discussed with the attending physician, who is in agreement with the above assessment and plan.      History     Chief Complaint  Chief Complaint   Patient presents with    Shortness of Breath       HPI   Karen Barber is a 57 y.o. female with past medical history as below who presents with 2 days of acute on chronic worsening shortness of breath.  Reports that she has been using 8-10 L of oxygen at home rather than her normal 6.  Reports she was never feeling better after discharge from the ED on 8/7 (pneumonia, bronchiectasis, pulmonary HTN). Has now developed a productive cough with brown sputum for the past two days. Reports chills and subjective fever, denies nausea, vomiting. Reports she was experiencing chest pain with coughing yesterday but this has since largely resolved but still experiencing intermittent L sided pleuritic pain. Completed full course of Augmentin prescribed after last admission but states she typically requires two antibiotics for pulmonary infections. Has been following with outpatient PCP and pulmonologist since discharge.     Outside Historian(s): No collateral obtained.    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 A.niger from sputum 08/2016-09/2016.    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    Sarcoidosis 1995    Type 2 diabetes mellitus with hyperglycemia (CMS-HCC) 07/27/2014    Visual impairment     glasses       Past Surgical History:   Procedure Laterality Date    LUNG LOBECTOMY      PR AMPUTATION TOE,MT-P JT Right 12/12/2019    Procedure: Right foot fifth toe amputation at the metatarsophalangeal joint level;  Surgeon: Britt Bottom, DPM;  Location: MAIN OR [facility];  Service: Vascular    PR AMPUTATION TOE,MT-P JT Right 06/16/2020    Procedure: Right foot  amputation of 3rd toe at mpj level;  Surgeon: Britt Bottom, DPM;  Location: MAIN OR [facility];  Service: Vascular    PR RIGHT HEART CATH O2 SATURATION & CARDIAC OUTPUT N/A 04/29/2019    Procedure: Right Heart Catheterization;  Surgeon: Rosana Hoes, MD;  Location: [facility] CATH;  Service: Cardiology    PR THORACOSCOPY SURG TOT PULM DECORT Left 12/14/2014    Procedure: THORACOSCOPY SURG; W/TOT PULM DECORTIC/PNEUMOLYS;  Surgeon: Evert Kohl, MD;  Location: MAIN OR [facility];  Service: Cardiothoracic    PR THORACOSCOPY W/THERA WEDGE RESEXN INITIAL UNILAT Left 12/03/2014    Procedure: THORACOSCOPY, SURGICAL; WITH THERAPEUTIC WEDGE RESECTION (EG, MASS, NODULE) INITIAL UNILATERAL;  Surgeon: Evert Kohl, MD;  Location: MAIN OR [facility];  Service: Cardiothoracic         Current Facility-Administered Medications:     acetaminophen (TYLENOL) tablet 650 mg, 650 mg, Oral, Q4H PRN, Rocco Serene, MD, 650 mg at 11/05/22 1526    [Provider Hold] amlodipine (NORVASC) tablet 5 mg, 5 mg, Oral, Daily, Rocco Serene, MD    arformoterol [facility]) nebulizer solution 15 mcg/2 mL, 15 mcg, Nebulization, BID (RT), Rocco Serene, MD, 15 mcg at 11/05/22 1449    [START ON 11/06/2022] azithromycin (ZITHROMAX) tablet 500 mg, 500 mg, Oral, Q24H SCH, Rocco Serene, MD    budesonide (PULMICORT) nebulizer solution 0.5 mg, 0.5 mg, Nebulization, BID (RT), Rocco Serene, MD, 0.5 mg at 11/05/22 1449    dextrose (D10W) 10% bolus 125 mL, 12.5 g, Intravenous, Q10 Min PRN, Rocco Serene, MD    furosemide (LASIX) injection 80 mg, 80 mg, Intravenous, TID, Rocco Serene, MD    gabapentin (NEURONTIN) capsule 200 mg, 200 mg, Oral, TID, Rocco Serene, MD, 200 mg at 11/05/22 1350    glucagon injection 1 mg, 1 mg, Intramuscular, Once PRN, Rocco Serene, MD    glucose chewable tablet 16 g, 16 g, Oral, Q10 Min PRN, Rocco Serene, MD    insulin lispro (HumaLOG) injection 0-20 Units, 0-20 Units, Subcutaneous, ACHS, Rocco Serene, MD, 4 Units at 11/05/22 1531    ipratropium-albuterol (DUO-NEB) 0.5-2.5 mg/3 mL nebulizer solution 3 mL, 3 mL, Nebulization, Q4H (RT), Rocco Serene, MD, 3 mL at 11/05/22 1459    melatonin tablet 3 mg, 3 mg, Oral, Nightly PRN, Rocco Serene, MD    piperacillin-tazobactam (ZOSYN) 3.375 g in sodium chloride 0.9 % (NS) 100 mL IVPB-MBP, 3.375 g, Intravenous, Q6H, Rocco Serene, MD, Stopped at 11/05/22 1423    polyethylene glycol (MIRALAX) packet 17 g, 17 g, Oral, Daily PRN, Rocco Serene, MD    [START ON 11/06/2022] posaconazole (NOXAFIL) delayed released tablet 200 mg, 200 mg, Oral, Daily, Rocco Serene, MD    senna Mancel Parsons) tablet 2 tablet, 2 tablet, Oral, Nightly PRN, Rocco Serene, MD    sildenafiL (pulm.hypertension) (REVATIO) tablet 40 mg, 40 mg, Oral, TID, Rocco Serene, MD, 40 mg at 11/05/22 1440    sodium chloride 0.9 % nebulizer solution 3 mL, 3 mL, Nebulization, 4x Daily (RT), Truett Mainland, MD    vancomycin (VANCOCIN) 1500 mg in sodium chloride (NS) 0.9% 500 mL IVPB, 1,500 mg, Intravenous, Q24H, Last Rate: 370 mL/hr at 11/05/22 1441, 1,500 mg at 11/05/22 1441 **AND** Inpatient consult to Pharmacy RX to dose: vancomycin, , , Once, Rocco Serene, MD    Allergies  Patient has no known allergies.    Family History  Family History   Problem Relation  Age of Onset Dementia Mother     Diabetes Father     Diabetes Brother     Diabetes Maternal Aunt     Sarcoidosis Maternal Aunt     Diabetes Maternal Uncle     Diabetes Paternal Aunt     Diabetes Paternal Uncle        Social History  Social History     Tobacco Use    Smoking status: Former     Current packs/day: 0.00     Average packs/day: 0.5 packs/day for 9.0 years (4.5 ttl pk-yrs)     Types: Cigarettes     Start date: 05/02/1978     Quit date: 05/03/1987     Years since quitting: 35.5    Smokeless tobacco: Never   Vaping Use    Vaping status: Never Used   Substance Use Topics    Alcohol use: No     Alcohol/week: 0.0 standard drinks of alcohol    Drug use: No        Physical Exam     VITAL SIGNS:      Vitals:    11/05/22 1334 11/05/22 1357 11/05/22 1400 11/05/22 1442   BP:   98/61    Pulse:  88 91 92   Resp:  17 21 21    Temp:       SpO2:  94% 98% 94%   Weight: 86.8 kg (191 lb 5.8 oz)          Constitutional: Alert and oriented. No acute distress.  Eyes: Conjunctivae are normal.  HEENT: Normocephalic and atraumatic. Conjunctivae clear. No congestion. Moist mucous membranes.   Cardiovascular: Rate as above, regular rhythm. Normal and symmetric distal pulses. Brisk capillary refill. Normal skin turgor.  Respiratory: Normal respiratory effort, no accessory muscle use. Expiratory wheezing and left sided crackles appreciated.   Gastrointestinal: Soft, non-distended, non-tender. No guarding, rigidity, or rebound tenderness.   Genitourinary: Deferred.  Musculoskeletal: Non-tender with normal range of motion in all extremities.Bilateral 1+ pitting edema noted in LE, pt states this is near her baseline.   Neurologic: Normal speech and language. No gross focal neurologic deficits are appreciated. Patient is moving all extremities equally, face is symmetric at rest and with speech.  Skin: Skin is warm, dry and intact. No rash noted.  Psychiatric: Mood and affect are normal. Speech and behavior are normal.     Radiology     XR Chest 2 views Final Result      Stable findings of pulmonary sarcoidosis bilaterally with new hazy airspace disease and patchy consolidation in left midlung on background of fibrosis, may represent new foci of infection or aspiration. Recommend attention on follow-up chest radiograph in 6 to 8 weeks to ensure resolution.      CT Chest Wo Contrast    (Results Pending)       Pertinent labs & imaging results that were available during my care of the patient were independently interpreted by me and considered in my medical decision making (see chart for details).    Portions of this record have been created using Scientist, clinical (histocompatibility and immunogenetics). Dictation errors have been sought, but may not have been identified and corrected.            Darliss Cheney [place], [place]  11/05/22 1616

## 2022-11-05 NOTE — Unmapped (Addendum)
[facility]   History and Physical       Assessment and Plan     Karen Barber is a 57 y.o. 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      Acute on chronic hypoxemic respiratory failure  Acute hypercapnic respiratory failure  Bronchiectasis exacerbation  Reactive airway disease exacerbation  Chronic hemoptysis  Chronic illness with severe exacerbation or progression that has a significant risk of morbidity without appropriate treatment  -Afebrile, leukocytosis with neutrophilic shift, hemodynamically stable  -Up to 10 L at home with exertion, currently saturating 99 to 100% on 5 to 6 L in emergency department  -Symptoms of increasing sputum production, dyspnea, dark brown sputum, left-sided pleuritic chest pain  Plan:  -Pulmicort Brovana BID, duonebs q4 hours for now, s/p IV solumedrol, Ceftriaxone and Azithromycin in ED  -Recent completed course of Augmentin posthospital discharge on August 7, due to leukocytosis with neutrophilic shift, increasing O2 requirement, increasing sputum production concern for bronchiectasis/reactive airway disease exacerbation in setting of recent hospitalization thus will provide broad-spectrum antibiotics with vancomycin, Zosyn, azithromycin  -De-escalate antibiotics as appropriate, MRSA nares pending, respiratory pathogen panel pending, lower respiratory culture pending, AFB culture  -Monitor for drug toxicity with daily BMP for vancomycin, Zosyn  -Airway clearance as needed, Aerobika, incentive spirometry, noted to have history of increasing hemoptysis on hypertonic saline, hold off hypertonic saline for now  -Holding off chemical DVT prophylaxis given history of hemoptysis for now, SCDs only  -Repeat VBG, no signs of somnolence or concern for airway compromise at this time  -Continuous oxygen monitoring, telemetry, intermediate care unit for now  -Pulmonology consulted, case discussed, appreciate recommendations    Secondary/Additional Active Problems:  Pulmonary sarcoidosis (CMS-HCC)  -Followed by pulmonology outpatient, pulmonary consulted this admission, appreciate recommendations    HTN (hypertension)  -Continue home amlodipine    Type 2 diabetes mellitus with hyperglycemia (CMS-HCC)  -A1c 8.3% July 2024, Home regimen includes glargine 18 units nightly, sliding scale insulin  -Noted to have significant steroid-induced hyperglycemia during previous hospitalizations, given degree of wheezing is mild to moderate, will try to mitigate with nebs and airway clearance however if no civic improvement would recommend steroids and increasing insulin regimen as appropriate  -Sliding scale insulin for now    Chronic pulmonary aspergillosis (CMS-HCC)  -Continue home posaconazole 200 mg daily, EKG on admission obtained with normal Qtc    Pulmonary hypertension (CMS-HCC)  -Right heart catheterization October 2023 with RA 5, wedge 17, PA 71/30, mean 45  -TTE with severe RV dysfunction with PASP of 91  -Increase sildenafil to 40 mg TID after discussion with Pulmonology  -Pulmonology consulted as above, appreciate recommendations  -Continue Lasix, changed to 80 mg IV three times a daily, monitor renal function, adjust back to BID dosing once volume status improves     CKD stage III  -Baseline creatinine appears to be around 1.2-1.3  -BMP in emergency department elevated at 1.45, daily BMP, continue monitor for toxicity with above antibiotics  -Renally dose medications, avoid nephrotoxic agents  -Renally dose medications, avoid nephrotoxic agents renally dose medications, avoid nephrotoxic agents    Prophylaxis  -Hold Lovenox/chemical DVT prophylaxis in setting of possible mopped assist, SCDs only, ambulation as tolerated    Diet  -Advance diet as tolerated  Code Status / HCDM  -Full Code, Discussed with patient at the time of admission   -  HCDM (patient stated preference): [name] - Domestic Partner [phone]    Anticipated Medically Ready for Discharge: Anticipated in 2-4 Days      Issues Impacting Complexity of Management:  -Intensive monitoring of drug toxicity from Vancomycin with scheduled BMP and/or Vancomycin levels, Zosyn with scheduled BMP, and Lasix with scheduled BMP  -Need for intensive oxygen therapy of >/= 10L, which places the patient at high risk for oxygen toxicity  -The patient is at high risk of complications from acute on chronic hypoxic respiratory failure, hemoptysis, respiratory infection    I personally spent greater than 75 minutes face-to-face and non-face-to-face in the care of this patient, which includes all pre, intra, and post visit time on the date of service.  All documented time was specific to the E/M visit and does not include any procedures that may have been performed.    HPI      Karen Barber is a 57 y.o. female who is presenting to [facility] with No Principal Problem: There is no principal problem currently on the Problem List. Please update the Problem List and refresh..    Patient states since discharge on August 7 she has not felt well and has progressively felt worse.  She states that she has had increased nausea requirement a last few days from baseline of 5 to 6 L to up to 10 L nasal cannula.  She states she has had increase in sputum production dark brown in color.  Denies any increase in hemoptysis or bright red blood however.  Denies any lightheadedness or palpitations, chest pain.  States she does have left-sided pleuritic chest pain with deep inspiration.  States she has had subjective fevers but denies any diaphoresis.  Denies any abdominal pain, nausea, vomiting, urinary symptoms, significant lower extremity swelling.  She is taking Lasix as prescribed she is taking sildenafil as prescribed, she did complete her course of Augmentin for 14 days.      Med Rec Confidence   I reviewed the Medication List. The current list is Accurate    Physical Exam   Temp:  [37.6 ??C (99.6 ??F)] 37.6 ??C (99.6 ??F)  Heart Rate:  [87-92] 92  SpO2 Pulse:  [90-91] 91  Resp:  [17-24] 24  BP: (101-107)/(70-79) 105/79  SpO2:  [99 %-100 %] 100 %  Body mass index is 25.38 kg/m??.    General -moderate distress from respiratory illness  Eyes - PERRLA, EOM intact, conjunctive anicteric   Head and Neck - No noticeable or palpable swelling, redness or rash around throat or on face   Lymph Nodes - No lymphadenopathy    Cardiovascular - RRR no m/r/g, no JVD, no carotid bruits, 1+ bilateral lower extremity pitting edema  Lungs -moderate expiratory wheezing bilaterally on lung auscultation, no significant rhonchi or rale  Skin - No rashes, skin warm and dry, no erythematous areas      Psychiatry -  Alert and oriented x 4  Abdomen - Normal bowel sounds, abdomen soft and nontender    Extremities -1+ bilateral pitting lower extremity edema, no significant clubbing    Musculo-Skeletal - 5/5 strength, normal range of motion, no swollen or erythematous  joints.    Neurological - Alert and oriented x 3, CN 2-12 grossly intact

## 2022-11-05 NOTE — Unmapped (Signed)
11/05/22 1625   NPPV   $$ NPPV  BIPAP/ CPAP   NPPV Status Set up   Adverse Reactions None   Interface Full face mask   Interface Size Medium   Skin Integrity excellent   Mepilex applied? no   NPPV Type BiPAP   Equipment ID [id number]   Device model V60   FiO2 (%) 50 %   IPAP 12 cm H2O   EPAP 8 cm H2O   Set Rate 10 breaths/min   Rise Time Setting 2   Insp Time 0.9 sec   NPPV Interventions   NPPV/CPAP Maintenance proper fit/secure   NPPV Readings   Total Rate 25 breaths/min   Vt (Exhaled) 422 mL   Minute Volume 12.8 L/min   PIP 13 cm H2O   Patient Leak 0 L/min   Alarm Check On;Functional/Tested   RR Max 50 bpm   RR Min 8 bpm   High Vt 1000 cmH2O   Vt Min 100 ml   High Pressure 35 cm H2O   Pressure Minimum 2 cmH2O   Low Min Vol 2   Alarm Volume 10   Humidifier Checked n/a     Placed on bipap due to desaturation on 15 lpm large bore;

## 2022-11-05 NOTE — Unmapped (Signed)
General Pulmonary Team Initial Consult Note     Date of Service: 11/05/2022  Requesting Physician: Rocco Serene, MD   Requesting Service: Med Undesignated (MDX)  Reason for consultation: Comprehensive evaluation of acute on chronic hypoxemic and hypercarbic respiratory failure secondary to likely pneumonia superimposed on pulmonary sarcoidosis with bronchiectasis and aspergilloma, severe persistent asthma, pulmonary arterial hypertension with cor pulmonale.    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 female with chronic hypoxemic and hypercarbic respiratory failure secondary to pulmonary sarcoidosis with bronchiectasis and aspergilloma, severe persistent asthma, pulmonary arterial hypertension with cor pulmonale.  She was recently hospitalized for acute on chronic hypoxemic and hypercarbic respiratory failure in the setting of hemoptysis.  She was diuresed and treated with antibiotics.  She saw Dr. Audrea Muscat in the pulmonary clinic 5 days ago, reporting that she felt to be at 90% of her baseline.    She presented today due to at least 48 hours of worsening shortness of breath and increased oxygen requirement.  She was using 6 L of oxygen at baseline but was needing to increase it to 8-10L in order to maintain her oxygen saturations.  Otherwise, she was desaturating to the 70s and 80s.  Over the past couple of days, she has been coughing up brown sputum with streaks, flecks, and small amounts of blood, not increased from prior.  She has found it difficult to cough up sputum in the absence of her airway clearance therapies.  She also noticed increased swelling of her lower extremities despite continuing to have a good diuretic response.  She typically sleeps propped up and had to increase her pillows further.  No nausea until she received IV fluid bolus in the ER.  She had subjective fevers at home but did not feel well enough to go and check her temperature.  Endorses soreness from coughing over her chest, left greater than right.  No GERD, dysphagia, regurgitation, or aspiration.    In the emergency room, she was given a 1 L fluid bolus over 15 minutes per sepsis protocol.  She was also given azithromycin, vancomycin and Zosyn.  Given Solu-Medrol 125 mg.  Upon arrival to the CCU, she required 11 to 12 L of supplemental oxygen after being moved to the bed.    Her prior lower respiratory cultures have primarily yielded oropharyngeal flora and more distantly Aspergillus [place].  Isolated Enterococcus faecium and Achromobacter species in July 2018.  Culture from August 2016 also had Achromobacter.  Isolated stenotrophomonas maltophilia from lung tissue in September 2016.  Her AFB cultures have also been negative with the exception of Mycobacterium fortuitum which is not typically pathogenic but can be seen in the setting of recurrent aspiration.  Has never isolated MRSA including from skin/soft tissue.    Problems addressed during this consult include acute on chronic respiratory failure, eosinophilic asthma, PAH with cor pulmonale, chronic bronchiectasis, sarcoidosis, recurrent hemoptysis. Based on these problems, the patient has high risk of morbidity/mortality which is commensurate w their risk of management options described below in the recommendations.    Assessment      Interval Events: None  Impression: The patient has made no clinical progress since last encounter. I personally reviewed most recent pertinent labs, imaging and micro data, from 11/05/2022, which are noted in recommendations.  Suspect that she has developed a pneumonia either secondary to asymptomatic aspiration or inspissated secretions in the context of holding her airway clearance.  She has very little reserve due to her underlying cardiopulmonary disease.  I worry that the 1 L bolus she received in the emergency room may push her off of her Starling curve further.        Recommendations   1) PNA: At risk for HCAP given recent admission.  -Agree with obtaining lower respiratory and AFB cultures.  MRSA swab helpful in determining whether we need to cover with vancomycin.  -Agree with empiric vancomycin, Zosyn, and azithromycin pending above culture data.  Transitioning azithromycin to oral to decrease fluid load.  -If she does not isolate Pseudomonas or other pathogen gram-negative's, will be comfortable using Unasyn to cover anaerobes.    2) Sarcoidosis with bronchiectasis and aspergilloma, eosinophilic asthma, chronic hypoxemic and hypercarbic respiratory failure 2/2 restrictive lung disease:  - Continue posaconazole  - Starting airway clearance with nebulized normal saline, DuoNebs, Aerobika 4 times per day.  Will need to closely monitor for changes in hemoptysis.  -Continue Pulmicort 0.5 mg and arformoterol 15 mcg nebulized twice a day  -Trialing AVAPS at night and during the day as needed.  Given her spirometry, suspect that she will qualify for home AVAPS use without need for ABG.  PSG done at [facility] in May 2022 showed no evidence of obstructive sleep apnea.  - Does not need further steroids.    3) PAH with cor pulmonale:  - Increase sildenafil to 40 mg tid.  - Aggressive diuresis with lasix 80 mg IV tid. Reassess diuretic dosing in AM.  - Avoid IVF bolus when possible. If needs volume, give slowly and reassess frequently.    4) AoCKD:  - Checking cystatin C as suspect she has lost muscle mass with her repeated hospitalizations.  - No need for CT with contrast at this time.  If she does have larger volume hemoptysis, would favor skipping CTA and moving instead to angiogram with potential embolization by IR (skipping 2 contrast loads)    The recommendations outlined in this note were discussed w the primary team direct (in-person). Please do not hesitate to page [phone] [facility] consult) with questions. We appreciate the opportunity to assist in the care of this patient. We look forward to following with you.    Attestation      I directly provided consultative services as documented in this note and I personally spent 120 minutes face-to-face and non face-to-face in the care of this patient, which includes all pre, intra and post visit time on the date of service including examining the patient, evaluating/interpreting objective clinical data, coordinating care with the entire multidisciplinary care team and developing a comprehensive management plan as outlined above. All documented time was specific to the E/M visit and does not include any procedures that may have been performed.      Viona Gilmore, MD MPH    Subjective & Objective     Vitals - past 24 hours  Temp:  [37.6 ??C (99.6 ??F)] 37.6 ??C (99.6 ??F)  Heart Rate:  [87-92] 92  SpO2 Pulse:  [90-91] 91  Resp:  [17-24] 24  BP: (101-107)/(70-79) 105/79  SpO2:  [99 %-100 %] 100 % Intake/Output  No intake/output data recorded.  Pertinent exam findings:   General appearance - appears fatigued, chronically ill, and generally unwell but non-toxic.  Eyes - EOMI, PERRLA, anicteric sclerae, pink conjunctiva  Mouth - moist mucous membranes, no pharyngeal erythema or exudates  Neck - Trachea midline, normal neck movement  Lymphatics - not examined  Heart - Regular rate and rhythm. No murmur. Sharp S2 but able to split heart sounds.  Chest - equal expansion, coarse bronchial breath sounds throughout but no overt wheezes, rhonchi, or rales.  Speaking comfortably in full sentences without difficulty.  Abdomen - soft, nontender, nondistended, no masses or organomegaly  Extremities -1+ pitting lower extremity edema bilaterally extending at least to mid shin.  Mild digital clubbing.  Unable to appreciate cyanosis due to artificial nails.  Skin - normal coloration and turgor, no rashes, no suspicious skin lesions noted  Neurological - alert, oriented, normal speech, no focal findings or movement disorder noted    Relevant Imaging:  - CXR on 11/05/2022 revealed stable findings of pulmonary sarcoidosis including fibrosis and bullous disease with superimposed hazy/patchy consolidation in the left mid lung zone.  - CT chest with contrast on 10/21/22 revealed no pulmonary emboli or evidence of pulmonary hemorrhage.  Unchanged fibrotic pulmonary sarcoidosis with biapical upper lobe cystic bronchiectasis and scattered segmental and subsegmental fibrosis with bronchiectasis.  Unchanged bulky eggshell calcified mediastinal and hilar lymphadenopathy compatible with pulmonary sarcoidosis.  - ECHO on 10/23/2022 revealed normal EF of greater than 55% but severe pulmonary hypertension with severe dilation of the right ventricle and mildly reduced systolic function.     Arterial Blood Gas:   No results for input(s): SPECTYPEART, PHART, PCO2ART, PO2ART, HCO3ART, BEART, O2SATART in the last 24 hours.     Venous Blood Gas:   Recent Labs     Units 11/05/22  1119   PHVEN  7.37   PCO2VEN mm Hg 68*   PO2VEN mm Hg 17*   HCO3VEN mmol/L 34*   BEVEN  13.8*   O2SATVEN % 20.7*        Cultures:  Blood Culture, Routine (no units)   Date Value   10/21/2022 Micrococcus species (A)   10/21/2022 No Growth at 5 days     Lower Respiratory Culture (no units)   Date Value   10/24/2022 OROPHARYNGEAL FLORA ISOLATED     WBC (10*9/L)   Date Value   11/05/2022 12.6 (H)          Other Labs:  Lab Results   Component Value Date    WBC 12.6 (H) 11/05/2022    HGB 9.9 (L) 11/05/2022    HCT 30.5 (L) 11/05/2022    PLT 221 11/05/2022     Lab Results   Component Value Date    NA 136 11/05/2022    K 3.8 11/05/2022    CL 98 11/05/2022    CO2 34.9 (H) 11/05/2022    BUN 23 11/05/2022    CREATININE 1.45 (H) 11/05/2022    GLU 224 (H) 11/05/2022    CALCIUM 9.2 11/05/2022    MG 1.6 11/05/2022    PHOS 2.7 11/05/2022     Lab Results   Component Value Date    BILITOT 1.1 11/05/2022    BILIDIR <0.10 05/30/2019    PROT 7.2 11/05/2022    ALBUMIN 3.3 (L) 11/05/2022    ALT <7 (L) 11/05/2022    AST 9 11/05/2022    ALKPHOS 84 11/05/2022    GGT 60 (H) 05/31/2011     Lab Results   Component Value Date  INR 1.02 12/11/2019    APTT 30.9 12/11/2019       Allergies & Home Medications   Personally reviewed in Epic    Continuous Infusions:       Scheduled Medications:    azithromycin  500 mg Intravenous Once    [START ON 11/06/2022] azithromycin  500 mg Oral Q24H SCH    ipratropium-albuterol  3 mL Nebulization Q6H (RT)       PRN medications:

## 2022-11-06 LAB — BASIC METABOLIC PANEL
ANION GAP: 6 mmol/L (ref 5–14)
ANION GAP: 5 mmol/L (ref 5–14)
BLOOD UREA NITROGEN: 28 mg/dL — ABNORMAL HIGH (ref 9–23)
BLOOD UREA NITROGEN: 28 mg/dL — ABNORMAL HIGH (ref 9–23)
BUN / CREAT RATIO: 18
BUN / CREAT RATIO: 20
CALCIUM: 8.7 mg/dL (ref 8.7–10.4)
CALCIUM: 9.1 mg/dL (ref 8.7–10.4)
CHLORIDE: 96 mmol/L — ABNORMAL LOW (ref 98–107)
CHLORIDE: 99 mmol/L (ref 98–107)
CO2: 31.1 mmol/L — ABNORMAL HIGH (ref 20.0–31.0)
CO2: 32.9 mmol/L — ABNORMAL HIGH (ref 20.0–31.0)
CREATININE: 1.54 mg/dL — ABNORMAL HIGH
CREATININE: 1.43 mg/dL — ABNORMAL HIGH
EGFR CKD-EPI (2021) FEMALE: 39 mL/min/{1.73_m2} — ABNORMAL LOW (ref >=60)
EGFR CKD-EPI (2021) FEMALE: 42 mL/min/{1.73_m2} — ABNORMAL LOW (ref >=60)
GLUCOSE RANDOM: 542 mg/dL (ref 70–179)
GLUCOSE RANDOM: 344 mg/dL — ABNORMAL HIGH (ref 70–179)
POTASSIUM: 4.4 mmol/L (ref 3.4–4.8)
POTASSIUM: 3.8 mmol/L (ref 3.4–4.8)
SODIUM: 133 mmol/L — ABNORMAL LOW (ref 135–145)
SODIUM: 137 mmol/L (ref 135–145)

## 2022-11-06 LAB — LACTATE, VENOUS, WHOLE BLOOD
LACTATE BLOOD VENOUS: 2.6 mmol/L — ABNORMAL HIGH (ref 0.5–1.8)
LACTATE BLOOD VENOUS: 2.7 mmol/L — ABNORMAL HIGH (ref 0.5–1.8)
LACTATE BLOOD VENOUS: 2.9 mmol/L — ABNORMAL HIGH (ref 0.5–1.8)

## 2022-11-06 LAB — CBC
HEMATOCRIT: 29.1 % — ABNORMAL LOW (ref 34.0–44.0)
HEMOGLOBIN: 9.6 g/dL — ABNORMAL LOW (ref 11.3–14.9)
MEAN CORPUSCULAR HEMOGLOBIN CONC: 32.8 g/dL (ref 32.0–36.0)
MEAN CORPUSCULAR HEMOGLOBIN: 26.4 pg (ref 25.9–32.4)
MEAN CORPUSCULAR VOLUME: 80.3 fL (ref 77.6–95.7)
MEAN PLATELET VOLUME: 8.7 fL (ref 6.8–10.7)
PLATELET COUNT: 200 10*9/L (ref 150–450)
RED BLOOD CELL COUNT: 3.63 10*12/L — ABNORMAL LOW (ref 3.95–5.13)
RED CELL DISTRIBUTION WIDTH: 15.3 % — ABNORMAL HIGH (ref 12.2–15.2)
WBC ADJUSTED: 10.7 10*9/L (ref 3.6–11.2)

## 2022-11-06 LAB — CYSTATIN C
CYSTATIN C: 2.02 mg/L — ABNORMAL HIGH (ref 0.64–1.23)
EGFR CKD-EPI (2012) CYSTATIN C FEMALE: 29 mL/min/{1.73_m2} — ABNORMAL LOW (ref >=60)

## 2022-11-06 LAB — IGE: TOTAL IGE: 24.4 [IU]/mL

## 2022-11-06 LAB — MAGNESIUM: MAGNESIUM: 1.7 mg/dL (ref 1.6–2.6)

## 2022-11-06 LAB — B-TYPE NATRIURETIC PEPTIDE: B-TYPE NATRIURETIC PEPTIDE: 106.2 pg/mL — ABNORMAL HIGH (ref <=100)

## 2022-11-06 MED ADMIN — insulin glargine (LANTUS) injection 18 Units: 18 [IU] | SUBCUTANEOUS | @ 02:00:00

## 2022-11-06 MED ADMIN — sildenafiL (pulm.hypertension) (REVATIO) tablet 40 mg: 40 mg | ORAL | @ 18:00:00

## 2022-11-06 MED ADMIN — potassium chloride ER tablet 40 mEq: 40 meq | ORAL | @ 22:00:00 | Stop: 2022-11-06

## 2022-11-06 MED ADMIN — insulin lispro (HumaLOG) injection 8 Units: 8 [IU] | SUBCUTANEOUS | @ 12:00:00 | Stop: 2022-11-06

## 2022-11-06 MED ADMIN — ipratropium-albuterol (DUO-NEB) 0.5-2.5 mg/3 mL nebulizer solution 3 mL: 3 mL | RESPIRATORY_TRACT | @ 17:00:00

## 2022-11-06 MED ADMIN — insulin lispro (HumaLOG) injection 5 Units: 5 [IU] | SUBCUTANEOUS | @ 22:00:00

## 2022-11-06 MED ADMIN — insulin lispro (HumaLOG) injection 0-20 Units: 0-20 [IU] | SUBCUTANEOUS | @ 22:00:00

## 2022-11-06 MED ADMIN — magnesium oxide (MAG-OX) tablet 400 mg: 400 mg | ORAL | @ 16:00:00 | Stop: 2022-11-06

## 2022-11-06 MED ADMIN — piperacillin-tazobactam (ZOSYN) 3.375 g in sodium chloride 0.9 % (NS) 100 mL IVPB-MBP: 3.375 g | INTRAVENOUS | @ 18:00:00 | Stop: 2022-11-10

## 2022-11-06 MED ADMIN — insulin lispro (HumaLOG) injection 0-20 Units: 0-20 [IU] | SUBCUTANEOUS | @ 02:00:00

## 2022-11-06 MED ADMIN — insulin lispro (HumaLOG) injection 16 Units: 16 [IU] | SUBCUTANEOUS | @ 09:00:00 | Stop: 2022-11-06

## 2022-11-06 MED ADMIN — magnesium oxide (MAG-OX) tablet 400 mg: 400 mg | ORAL | @ 13:00:00 | Stop: 2022-11-06

## 2022-11-06 MED ADMIN — ipratropium-albuterol (DUO-NEB) 0.5-2.5 mg/3 mL nebulizer solution 3 mL: 3 mL | RESPIRATORY_TRACT | @ 19:00:00

## 2022-11-06 MED ADMIN — azithromycin (ZITHROMAX) tablet 500 mg: 500 mg | ORAL | @ 13:00:00 | Stop: 2022-11-08

## 2022-11-06 MED ADMIN — furosemide (LASIX) injection 80 mg: 80 mg | INTRAVENOUS | @ 18:00:00 | Stop: 2022-11-06

## 2022-11-06 MED ADMIN — gabapentin (NEURONTIN) capsule 200 mg: 200 mg | ORAL | @ 13:00:00

## 2022-11-06 MED ADMIN — sodium chloride 0.9 % nebulizer solution 3 mL: 3 mL | RESPIRATORY_TRACT

## 2022-11-06 MED ADMIN — ipratropium-albuterol (DUO-NEB) 0.5-2.5 mg/3 mL nebulizer solution 3 mL: 3 mL | RESPIRATORY_TRACT | @ 08:00:00

## 2022-11-06 MED ADMIN — insulin lispro (HumaLOG) injection 0-20 Units: 0-20 [IU] | SUBCUTANEOUS | @ 12:00:00

## 2022-11-06 MED ADMIN — gabapentin (NEURONTIN) capsule 200 mg: 200 mg | ORAL | @ 02:00:00

## 2022-11-06 MED ADMIN — acetaminophen (TYLENOL) tablet 650 mg: 650 mg | ORAL | @ 22:00:00

## 2022-11-06 MED ADMIN — NORepinephrine 8 mg in dextrose 5 % 250 mL (32 mcg/mL) infusion PMB: 0-30 ug/min | INTRAVENOUS | @ 08:00:00 | Stop: 2022-11-06

## 2022-11-06 MED ADMIN — sildenafiL (pulm.hypertension) (REVATIO) tablet 40 mg: 40 mg | ORAL | @ 02:00:00

## 2022-11-06 MED ADMIN — ipratropium-albuterol (DUO-NEB) 0.5-2.5 mg/3 mL nebulizer solution 3 mL: 3 mL | RESPIRATORY_TRACT | @ 04:00:00

## 2022-11-06 MED ADMIN — sildenafiL (pulm.hypertension) (REVATIO) tablet 40 mg: 40 mg | ORAL | @ 13:00:00

## 2022-11-06 MED ADMIN — gabapentin (NEURONTIN) capsule 200 mg: 200 mg | ORAL | @ 18:00:00

## 2022-11-06 MED ADMIN — arformoterol (BROVANA) nebulizer solution 15 mcg/2 mL: 15 ug | RESPIRATORY_TRACT | @ 12:00:00

## 2022-11-06 MED ADMIN — insulin NPH (HumuLIN,NovoLIN) injection 10 Units: 10 [IU] | SUBCUTANEOUS | @ 17:00:00 | Stop: 2022-11-06

## 2022-11-06 MED ADMIN — budesonide (PULMICORT) nebulizer solution 0.5 mg: .5 mg | RESPIRATORY_TRACT | @ 12:00:00

## 2022-11-06 MED ADMIN — ipratropium-albuterol (DUO-NEB) 0.5-2.5 mg/3 mL nebulizer solution 3 mL: 3 mL | RESPIRATORY_TRACT

## 2022-11-06 MED ADMIN — ipratropium-albuterol (DUO-NEB) 0.5-2.5 mg/3 mL nebulizer solution 3 mL: 3 mL | RESPIRATORY_TRACT | @ 12:00:00

## 2022-11-06 MED ADMIN — insulin lispro (HumaLOG) injection 0-20 Units: 0-20 [IU] | SUBCUTANEOUS | @ 16:00:00

## 2022-11-06 MED ADMIN — sodium chloride 0.9% (NS) bolus 500 mL: 500 mL | INTRAVENOUS | @ 06:00:00 | Stop: 2022-11-06

## 2022-11-06 MED ADMIN — posaconazole (NOXAFIL) delayed released tablet 200 mg: 200 mg | ORAL | @ 13:00:00

## 2022-11-06 MED ADMIN — furosemide (LASIX) injection 80 mg: 80 mg | INTRAVENOUS | @ 09:00:00 | Stop: 2022-11-06

## 2022-11-06 MED ADMIN — sodium chloride 0.9 % nebulizer solution 3 mL: 3 mL | RESPIRATORY_TRACT | @ 12:00:00 | Stop: 2022-11-06

## 2022-11-06 MED ADMIN — piperacillin-tazobactam (ZOSYN) 3.375 g in sodium chloride 0.9 % (NS) 100 mL IVPB-MBP: 3.375 g | INTRAVENOUS | @ 05:00:00 | Stop: 2022-11-10

## 2022-11-06 MED ADMIN — piperacillin-tazobactam (ZOSYN) 3.375 g in sodium chloride 0.9 % (NS) 100 mL IVPB-MBP: 3.375 g | INTRAVENOUS | @ 12:00:00 | Stop: 2022-11-10

## 2022-11-06 NOTE — Unmapped (Signed)
Problem: Adult Inpatient Plan of Care  Goal: Plan of Care Review  Outcome: Ongoing - Unchanged  Goal: Patient-Specific Goal (Individualized)  Outcome: Ongoing - Unchanged  Goal: Absence of Hospital-Acquired Illness or Injury  Outcome: Ongoing - Unchanged  Intervention: Prevent Skin Injury  Recent Flowsheet Documentation  Taken 11/05/2022 1600 by Nira Retort, RN  Positioning for Skin: Left  Taken 11/05/2022 1400 by Nira Retort, RN  Positioning for Skin: Supine/Back  Taken 11/05/2022 1330 by Nira Retort, RN  Positioning for Skin: Supine/Back  Intervention: Prevent and Manage VTE (Venous Thromboembolism) Risk  Recent Flowsheet Documentation  Taken 11/05/2022 1345 by Nira Retort, RN  Anti-Embolism Device Type:   SCD, Thigh   SCD, Knee  Anti-Embolism Intervention: Off  Anti-Embolism Device Location: BLE  Goal: Optimal Comfort and Wellbeing  Outcome: Ongoing - Unchanged  Goal: Readiness for Transition of Care  Outcome: Ongoing - Unchanged  Goal: Rounds/Family Conference  Outcome: Ongoing - Unchanged     Problem: Infection  Goal: Absence of Infection Signs and Symptoms  Outcome: Ongoing - Unchanged

## 2022-11-06 NOTE — Unmapped (Signed)
ULTRASOUND PIV PROCEDURE NOTE    Indications:   Poor venous access.    Ultrasound guidance was necessary to obtain access.     Procedure Details:  Identity of the patient was confirmed via name, medical record number and date of birth. The availability of the correct equipment was verified.    The vein was identified and measured for ultrasound catheter insertion.       Vein measurement (without tourniquet):   0.29 cm   A(n) 22 gauge 1.75 catheter was selected based on the recommendations below:    [facility] Catheter/Vein Ratio Guidelines    Chart to determine PIV catheter size/length to use based on vein diameter and depth   Catheter Gauge Size (g)  22g 20g 18g   Catheter length (inches)  1.75 1.75 1.75   Catheter diameter measurement (mm) 0.9 mm 1.1 mm 1.3 mm          Minimum required vein diameter       Sonosite (cm)  0.27 cm 0.33 cm 0.39 cm          Maximum vein depth  1.25 cm 1.25 cm 1.25 cm          The field was prepared with necessary supplies and equipment.  Probe cover and sterile gel utilized. Insertion site was prepped with chlorhexidineand allowed to dry.  The catheter extension was primed with normal saline.  The [place] PIV was placed in the R Forearm with 1attempt(s). See LDA for additional details.    Catheter aspirated, 3 mL blood return present. The catheter was then flushed with 10 mL of normal saline. Insertion site cleansed, and dressing applied per manufacturer guidelines. The catheter was inserted without difficulty by Christie Nottingham, RN.    Thank you,     Christie Nottingham, RN    Ultrasound Resource Nurse    Workup / Procedure Time:  30 minutes

## 2022-11-06 NOTE — Unmapped (Signed)
Additional order received and the patient is already on the caseload, see evaluation completed earlier this date. Continue with plan of care.

## 2022-11-06 NOTE — Unmapped (Addendum)
PHYSICAL THERAPY  Evaluation (11/06/22 1458)          Patient Name:  Karen Barber       Medical Record Number: [id number]   Date of Birth: [birth year]  Sex: Female        Post-Discharge Physical Therapy Recommendations:  PT Post Acute Discharge Recommendations: Skilled PT services indicated, 3x weekly   Equipment Recommendation  PT DME Recommendations: None          Treatment Diagnosis: Generalized muscle weakness        Activity Tolerance: Tolerated treatment well     ASSESSMENT  Problem List: Decreased endurance, Impaired balance, Shortness of breath, Decreased mobility      Assessment : Karen Barber is a 57 y.o. 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     Prior to admission pt was mod I w RW for household distance amb, mobilized with electric scooter in community. On 6LO2 at baseline. Pt highly motivated to make functional gains with therapy, chiefly limited by endurance deficits. Pt performed functional mobility at [facility] including bed mobility, STS transfers, and x5 ft ambulation w RW. No LOB noted with activity, rest breaks throughout 2/2 fatigue. After a review of the personal factors, comorbidities, clinical presentation, and examination of the number of affected body systems, the patient presents as a mod complexity case. Given CLOF, currently recommending 3x for post-acute therapy services to facilitate return to PLOF.        Today's Interventions: Endurance activities, Gait training, Patient/Family/Caregiver Education, Positioning, Therapeutic activity, Therapeutic exercise  Today's Interventions: PT eval, bed mobility, transfers, amb, seated therex (LAQs, marches x 20), vital sign monitoring, Ed re: POC, goals of care, falls reduction, progressive mobility     Personal Factors/Comorbidities Present: 1-2   Examination of Body System: Musculoskeletal, Pulmonary, Activity/participation  Clinical Presentation: Evolving    Clinical Decision Making: Moderate        PLAN  Planned Frequency of Treatment:    1-2x per day for: 3-4x week        Planned Interventions: Education (Patient/Family/Caregiver), Gait training, Home exercise program, Self-care / Home Management training, Therapeutic Exercise, Therapeutic Activity     Goals:   Patient and Family Goals: to get stronger     SHORT GOAL #1: Pt will complete all functional transfers mod I w LRAD               Time Frame : 1 week  SHORT GOAL #2: Pt will amb >75 ft., mod I w LRAD              Time Frame : 1 week                                                             Long Term Goal #1: The patient will safely complete short community mobility with LRAD and mod I in 6 weeks  Time Frame: 2 months     Prognosis:  Good  Positive Indicators: motivation  Barriers to Discharge: Decreased caregiver support, Inability to safely perform ADLS, Endurance deficits     SUBJECTIVE   ,     Patient  reports: agreeable to PT: I want to try and get up        Prior Functional Status: Mod I w RW for household distances, Art gallery manager for community distances. On 6LO2 at baseline. Lives alone but has family nearby who can help out.  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 A.niger from sputum 08/2016-09/2016.    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    Sarcoidosis 1995    Type 2 diabetes mellitus with hyperglycemia (CMS-HCC) 07/27/2014    Visual impairment     glasses            Social History     Tobacco Use    Smoking status: Former     Current packs/day: 0.00     Average packs/day: 0.5 packs/day for 9.0 years (4.5 ttl pk-yrs)     Types: Cigarettes     Start date: 05/02/1978     Quit date: 05/03/1987     Years since quitting: 35.5    Smokeless tobacco: Never   Substance Use Topics    Alcohol use: No     Alcohol/week: 0.0 standard drinks of alcohol       Past Surgical History:   Procedure Laterality Date    LUNG LOBECTOMY      PR AMPUTATION TOE,MT-P JT Right 12/12/2019    Procedure: Right foot fifth toe amputation at the metatarsophalangeal joint level;  Surgeon: Britt Bottom, DPM;  Location: MAIN OR [facility];  Service: Vascular    PR AMPUTATION TOE,MT-P JT Right 06/16/2020    Procedure: Right foot amputation of 3rd toe at mpj level;  Surgeon: Britt Bottom, DPM;  Location: MAIN OR [facility];  Service: Vascular    PR RIGHT HEART CATH O2 SATURATION & CARDIAC OUTPUT N/A 04/29/2019    Procedure: Right Heart Catheterization;  Surgeon: Rosana Hoes, MD;  Location: [facility] CATH;  Service: Cardiology    PR THORACOSCOPY SURG TOT PULM DECORT Left 12/14/2014    Procedure: THORACOSCOPY SURG; W/TOT PULM DECORTIC/PNEUMOLYS;  Surgeon: Evert Kohl, MD;  Location: MAIN OR [facility];  Service: Cardiothoracic    PR THORACOSCOPY W/THERA WEDGE RESEXN INITIAL UNILAT Left 12/03/2014    Procedure: THORACOSCOPY, SURGICAL; WITH THERAPEUTIC WEDGE RESECTION (EG, MASS, NODULE) INITIAL UNILATERAL;  Surgeon: Evert Kohl, MD;  [facility]: [facility];  Service: Cardiothoracic             Family History   Problem Relation Age of Onset    Dementia Mother     Diabetes Father     Diabetes Brother     Diabetes Maternal Aunt     Sarcoidosis Maternal Aunt     Diabetes  Maternal Uncle     Diabetes Paternal Aunt     Diabetes Paternal Uncle         Allergies: Patient has no known allergies.                  Objective Findings  Precautions / Restrictions  Precautions: Falls precautions  Weight Bearing Status: Non-applicable  Required Braces or Orthoses: Non-applicable     Pain Comments: no c/o pain  Medical Tests / Procedures: Reviewed in Epic  Equipment / Environment: Vascular access (PIV, TLC, Port-a-cath, PICC), Supplemental oxygen, Telemetry (6LO2)     Vitals/Orthostatics : HR 70s-80s, on 6LO2 throughout, resting O2sat 95%, decreased to 85% with activity but recovered to >93% with seated rest and PLB     Living Situation  Living Environment: House  Lives With: Alone (S.O. is in rehab program)  Home Living: One level home, Standard height toilet, Walk-in shower, Tub/shower unit, Stairs to enter without rails, Shower chair with back, Raised toilet seat with rails  Number of Stairs to Enter (outside): 2      Cognition: WFL  Visual/Perception: Within Functional Limits  Hearing: No deficit identified           Upper Extremities  UE ROM: Right WFL, Left WFL  UE Strength: Left WFL, Right WFL    Lower Extremities  LE ROM: Left WFL, Right WFL  LE Strength: Right WFL, Left WFL          Sensation: WFL    Static Sitting-Level of Assistance: Independent  Dynamic Sitting-Level of Assistance: Independent    Static Standing-Level of Assistance: Stand by assistance  Dynamic Standing - Level of Assistance: Stand by assistance  Standing Balance comments: SBA w RW      Bed Mobility: Supine to Sit  Supine to Sit assistance level: Modified independent, requires aide device or extra time  Bed Mobility comments: mod I from semi-fowlers     Transfers: Sit to Stand  Sit to Stand assistance level: Standby assist, set-up cues, supervision of patient - no hands on  Transfer comments: SBA w RW      Gait Level of Assistance: Standby assist, set-up cues, supervision of patient - no hands on  Gait Assistive Device: Rolling walker  Gait Distance Ambulated (ft): 5 ft  Skilled Treatment Performed: x5 ft in-room, slow gait with minor lateral trunk sway, no gross LOB noted. Very limited by fatigue requiring prolonged rest     Stairs: NT            Endurance: poor    Patient at end of session: All needs in reach, In bed, Nurse notified    Physical Therapy Session Duration  PT Individual [mins]: 24          AM-PAC-6 click  Help currently need turning over In bed?: None - Modified Independent/Independent  Help currently needed sitting down/standing up from chair with arms? : A Little - Minimal/Contact Guard Assist/Supervision  Help currently needed moving from supine to sitting on edge of bed?: None - Modified Independent/Independent  Help currently needed moving to and from bed from wheelchair?: A Little - Minimal/Contact Guard Assist/Supervision  Help currently needed walking in a hospital room?: A Little - Minimal/Contact Guard Assist/Supervision  Help currently needed climbing 3-5 steps with railing?: A Little - Minimal/Contact Guard Assist/Supervision    Basic Mobility Score 6 click: 20    6 click Score (in points): % of Functional Impairment, Limitation, Restriction  6: 100% impaired, limited, restricted  7-8: At  least 80%, but less than 100% impaired, limited restricted  9-13: At least 60%, but less than 80% impaired, limited restricted  14-19: At least 40%, but less than 60% impaired, limited restricted  20-22: At least 20%, but less than 40% impaired, limited restricted  23: At least 1%, but less than 20% impaired, limited restricted  24: 0% impaired, limited restricted        I attest that I have reviewed the above information.  Signed: Trena Platt, PT  Filed 11/06/2022

## 2022-11-06 NOTE — Unmapped (Addendum)
OCCUPATIONAL THERAPY  Evaluation (11/06/22 0936)    Patient Name:  Karen Barber       Medical Record Number: [id number]     Date of Birth: 1965/11/12  Sex: Female      Post-Discharge Occupational Therapy Recommendations: To be determined (Anticipate 3x with IADL support, formal recommendation TBD pending OOB assessment.)          Equipment Recommendation  OT DME Recommendations:  (TBD)       OT Treatment Diagnosis:      Respiratory failure, SOB and DOE impacting ADLs and mobility    Assessment  Problem List: Fall risk, Impaired ADLs, Decreased mobility, Decreased activity tolerance, Decreased endurance, Shortness of breath       Assessment: Karen Barber is a 57 y.o. 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     [name] presents to acute OT services below functional baseline with above stated deficits impacting functional safety and Independence in self care and ADL tasks. Pt reports grossly Mod I at baseline, has noted gradual decline in endurance over past few months; of note, her SO typically completes IADLs/household tasks though he is at a rehab facility, so pt completes all IADLs with increased time. Pt declined OOB activity this date 2/2 fatigue, though tolerated sitting unsupported upright in bed with VSS on 6L O2 [place] (baseline requirement). Pt benefits from acute OT services to increase functional strength, Independence, and endurance in daily routines and mobility.     Review of client factors, occupational history, assessment of occupational performance, and development of POC required Mod complexity OT evaluation.     Today's Interventions: AMPAC 19/24, role of OT, POC, sitting tolerance and balance, endurance and activity tolerance, MMT, encouraged OOB activity and self care tasks, pt education re: use of BSC vs purewick 2/2 skin integrity and infection risks (pt agreeable to trial later this date)    Activity Tolerance During Today's Session  Limited by fatigue    Plan  Planned Frequency of Treatment: Plan of Care Initiated: 11/06/22  1-2x per day for: 3-4x week       Planned Interventions:  Education (Patient/Family/Caregiver), Self-Care/Home Training, Home Exercise Program, Therapeutic Activity      GOALS:   Patient and Family Goals: Increase Independence and be able to complete light errands Independently.    Short Term:   SHORT GOAL #1: Pt will complete OOB assessment.   Time Frame : 3 days  SHORT GOAL #2: Pt will Independently demonstrate and/or verbalize at least 2 energy conservation techniques during functional activities.   Time Frame : 1 week     Long Term Goal #1: Pt will complete full self care routine Mod I using LRAD.  Time Frame: 4 weeks    Prognosis:  Fair  Positive Indicators:  PLOF, motivation  Barriers to Discharge: Decreased caregiver support, Inability to safely perform ADLS, Endurance deficits    Subjective  Medical Updates Since Last Visit/Relevant PMH Affecting Clinical Decision Making:    Prior Functional Status Pt reports grossly Independent-Mod I at baseline on 6L O2 [place], has had recurrent admissions this year for respiratory failure. Pt usually lives with partner, though he is in a rehab program so she has had to do more household tasks. Pt reports increase in fatigue and SOB during short mobility and I/ADLs, though is able  to manage with increased time. Pt has rollator and RW, though they don't fit around her home; typically uses electric scooter in community for short-distances. Brother and sister in law live down the road and are able to help intermittently.    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 A.niger from sputum 08/2016-09/2016.    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    Sarcoidosis 1995    Type 2 diabetes mellitus with hyperglycemia (CMS-HCC) 07/27/2014    Visual impairment     glasses    Social History     Tobacco Use    Smoking status: Former     Current packs/day: 0.00     Average packs/day: 0.5 packs/day for 9.0 years (4.5 ttl pk-yrs)     Types: Cigarettes     Start date: 05/02/1978     Quit date: 05/03/1987     Years since quitting: 35.5    Smokeless tobacco: Never   Substance Use Topics    Alcohol use: No     Alcohol/week: 0.0 standard drinks of alcohol      Past Surgical History:   Procedure Laterality Date    LUNG LOBECTOMY      PR AMPUTATION TOE,MT-P JT Right 12/12/2019    Procedure: Right foot fifth toe amputation at the metatarsophalangeal joint level;  Surgeon: Britt Bottom, DPM;  Location: MAIN OR [facility];  Service: Vascular    PR AMPUTATION TOE,MT-P JT Right 06/16/2020    Procedure: Right foot amputation of 3rd toe at mpj level;  Surgeon: Britt Bottom, DPM;  Location: MAIN OR [facility];  Service: Vascular    PR RIGHT HEART CATH O2 SATURATION & CARDIAC OUTPUT N/A 04/29/2019    Procedure: Right Heart Catheterization;  Surgeon: Rosana Hoes, MD;  Location: [facility] CATH;  Service: Cardiology    PR THORACOSCOPY SURG TOT PULM DECORT Left 12/14/2014    Procedure: THORACOSCOPY SURG; W/TOT PULM DECORTIC/PNEUMOLYS;  Surgeon: Evert Kohl, MD;  Location: MAIN OR [facility];  Service: Cardiothoracic    PR THORACOSCOPY W/THERA WEDGE RESEXN INITIAL UNILAT Left 12/03/2014    Procedure: THORACOSCOPY, SURGICAL; WITH THERAPEUTIC WEDGE RESECTION (EG, MASS, NODULE) INITIAL UNILATERAL;  Surgeon: Evert Kohl, MD;  Location: MAIN OR [facility];  Service: Cardiothoracic    Family History   Problem Relation Age of Onset    Dementia Mother     Diabetes Father     Diabetes Brother     Diabetes Maternal Aunt     Sarcoidosis Maternal Aunt     Diabetes Maternal Uncle     Diabetes Paternal Aunt  Diabetes Paternal Uncle         Patient has no known allergies.     Objective Findings  Precautions / Restrictions  Falls precautions       Weight Bearing  Non-applicable    Required Braces or Orthoses  Non-applicable    Communication Preference  Verbal, Visual, Written       Pain  No c/o pain this session    Equipment / Environment  Vascular access (PIV, TLC, Port-a-cath, PICC), Supplemental oxygen, Telemetry (6L O2 [place])    Living Situation  Living Environment: House  Lives With: Alone (S.O. is in rehab program.)  Home Living: One level home, Standard height toilet, Walk-in shower, Tub/shower unit, Stairs to enter without rails, Shower chair with back, Raised toilet seat with rails  Number of Stairs to Enter (outside): 2  Caregiver Identified?: No (Pt reports sister in law is working on becoming her [facility] aide, not finalized yet)  Equipment available at home: Rollator, Technical brewer   Orientation Level:  Oriented x 4   Arousal/Alertness:  Appropriate responses to stimuli   Attention Span:  Appears intact   Memory:  Appears intact   Following Commands:  Follows all commands and directions without difficulty   Safety Judgment:  Unable to assess (While unable to assess 2/2 bed-level only, pt aware of O2 line needs and to call for staff assist when OOB.)   Awareness of Errors and Problem Solving:  Patient self-corrected errors   Comments: Vision / Hearing   Vision: Wears glasses for reading only, Glasses present     Hearing: No deficit identified         Hand Function:  Right Hand Function: Right hand grip strength, ROM and coordination WNL  Left Hand Function: Left hand grip strength, ROM and coordination WNL  Hand Dominance: Right    Skin Inspection:  Skin Inspection: Intact where visualized    Face/Cervical ROM:  Face ROM: WFL  Cervical ROM: WFL    ROM / Strength:  UE ROM/Strength: Right WFL, Left Impaired/Limited  LUE Impairment: Reduced strength  LE ROM/Strength: Left Impaired/Limited, Right Impaired/Limited  RLE Impairment: Reduced strength  LLE Impairment: Reduced strength  LE ROM/ Strength Comment: Pt acheives figure four position and sat cross-legged while upright in bed    Coordination:  Coordination: WFL    Sensation:  RUE Sensation: RUE intact  LUE Sensation: LUE intact  RLE Sensation: RLE intact  LLE Sensation: LLE intact    Balance:  Static Sitting-Level of Assistance: Independent  Dynamic Sitting-Level of Assistance: Independent  Sitting Balance comments: Pt sat cross-legged unsupported in bed    Static Standing-Level of Assistance: Unable to assess  Dynamic Standing - Level of Assistance: Unable to assess  Standing Balance comments: NT 2/2 pt fatigue and limited activity tolerance    Functional Mobility  Bed Mobility - Needs Assistance: Modified Independent  Ambulation: NT 2/2 pt fatigue and limited activity tolerance    ADLs  ADLs: Needs assistance with ADLs  ADLs - Needs Assistance: Bathing  Bathing - Needs Assistance:  (Anticipate Mod Assist 2/2 endurance deficits and safety, recommend seated)    Vitals / Orthostatics  Vitals/Orthostatics: On 6L O2 [place] throughout session; SpO2 remained 92-95% while pt seated upright in bed.    Patient at end of session: All needs in reach, In bed, Nurse notified, Lines intact     Occupational Therapy Session Duration  OT Individual [mins]: 22    AM-PAC-Daily Activity  Lower Body  Dressing assistance needs: A Little - Minimal/Contact Guard Assist/Supervision  Bathing assistance needs: A lot - Maximum/Moderate Assistance  Toileting assistance needs: A Little - Minimal/Contact Guard Assist/Supervision  Upper Body Dressing assistance needs: None - Modified Independent/Independent  Personal Grooming assistance needs: A Little - Minimal/Contact Guard Assist/Supervision  Eating Meals assistance needs: None - Modified Independent/Independent    Daily Activity Score: 19    Score (in points): % of Functional Impairment, Limitation, Restriction  6: 100% impaired, limited, restricted  7-8: At least 80%, but less than 100% impaired, limited restricted  9-13: At least 60%, but less than 80% impaired, limited restricted  14-19: At least 40%, but less than 60% impaired, limited restricted  20-22: At least 20%, but less than 40% impaired, limited restricted  23: At least 1%, but less than 20% impaired, limited restricted  24: 0% impaired, limited restricted      I attest that I have reviewed the above information.  Signed: Jessica Priest, OT  [facility] 11/06/2022

## 2022-11-06 NOTE — Unmapped (Signed)
Increased fio2 on bipap to 60% due to desaturation low 80's

## 2022-11-06 NOTE — Unmapped (Signed)
Pt downgraded to stepdown status this afternoon. Pt is A&Ox4, VSS, afebrile. Weaning O2 as tolerated, currently on 6L large bore. Multiple desats noted with activity, increased to 10L while eating and ambulating. Sinus rhythm on monitor. Afebrile, no c/o pain. Adequate urine output via purewick, no BM noted today. Blood glucose levels remain elevated today, AM check >450, afternoon and evening both >300. Several insulin adjustments made and diet changed to carb consistent. Pt pleasant and cooperative with plan of care. Bed in locked and low position, call bell in reach.    Problem: Adult Inpatient Plan of Care  Goal: Plan of Care Review  Outcome: Progressing  Goal: Patient-Specific Goal (Individualized)  Outcome: Progressing  Goal: Absence of Hospital-Acquired Illness or Injury  Outcome: Progressing  Intervention: Identify and Manage Fall Risk  Recent Flowsheet Documentation  Taken 11/06/2022 0800 by Yvonna Alanis, RN  Safety Interventions:   aspiration precautions   bed alarm   bleeding precautions   commode/urinal/bedpan at bedside   environmental modification   fall reduction program maintained   infection management   lighting adjusted for tasks/safety   low bed   nonskid shoes/slippers when out of bed  Intervention: Prevent Skin Injury  Recent Flowsheet Documentation  Taken 11/06/2022 1400 by Yvonna Alanis, RN  Positioning for Skin: Supine/Back  Taken 11/06/2022 1200 by Yvonna Alanis, RN  Positioning for Skin: Right  Taken 11/06/2022 1000 by Yvonna Alanis, RN  Positioning for Skin: Left  Taken 11/06/2022 0800 by Yvonna Alanis, RN  Positioning for Skin: Supine/Back  Device Skin Pressure Protection:   absorbent pad utilized/changed   adhesive use limited   positioning supports utilized   pressure points protected   skin-to-device areas padded   skin-to-skin areas padded   tubing/devices free from skin contact  Skin Protection:   adhesive use limited   cleansing with dimethicone incontinence wipes   incontinence pads utilized   silicone foam dressing in place   skin-to-device areas padded   skin-to-skin areas padded   tubing/devices free from skin contact  Intervention: Prevent Infection  Recent Flowsheet Documentation  Taken 11/06/2022 0800 by Yvonna Alanis, RN  Infection Prevention:   cohorting utilized   environmental surveillance performed   equipment surfaces disinfected   hand hygiene promoted   personal protective equipment utilized   rest/sleep promoted   single patient room provided   visitors restricted/screened  Goal: Optimal Comfort and Wellbeing  Outcome: Progressing  Goal: Readiness for Transition of Care  Outcome: Progressing  Goal: Rounds/Family Conference  Outcome: Progressing     Problem: Infection  Goal: Absence of Infection Signs and Symptoms  Outcome: Progressing  Intervention: Prevent or Manage Infection  Recent Flowsheet Documentation  Taken 11/06/2022 0800 by Yvonna Alanis, RN  Infection Management: aseptic technique maintained     Problem: Self-Care Deficit  Goal: Improved Ability to Complete Activities of Daily Living  Outcome: Progressing     Problem: Fall Injury Risk  Goal: Absence of Fall and Fall-Related Injury  Outcome: Progressing  Intervention: Promote Injury-Free Environment  Recent Flowsheet Documentation  Taken 11/06/2022 0800 by Yvonna Alanis, RN  Safety Interventions:   aspiration precautions   bed alarm   bleeding precautions   commode/urinal/bedpan at bedside   environmental modification   fall reduction program maintained   infection management   lighting adjusted for tasks/safety   low bed   nonskid shoes/slippers when out of bed     Problem: Comorbidity Management  Goal: Maintenance of Asthma Control  Outcome: Progressing  Goal: Maintenance of  COPD Symptom Control  Outcome: Progressing  Goal: Blood Glucose Levels Within Targeted Range  Outcome: Progressing  Intervention: Monitor and Manage Glycemia  Recent Flowsheet Documentation  Taken 11/06/2022 1400 by Yvonna Alanis, RN  Glycemic Management: blood glucose monitored  Taken 11/06/2022 1200 by Yvonna Alanis, RN  Glycemic Management:   blood glucose monitored   supplemental insulin given  Taken 11/06/2022 1000 by Yvonna Alanis, RN  Glycemic Management: blood glucose monitored  Taken 11/06/2022 0800 by Yvonna Alanis, RN  Glycemic Management:   blood glucose monitored   supplemental insulin given  Goal: Blood Pressure in Desired Range  Outcome: Progressing

## 2022-11-06 NOTE — Unmapped (Signed)
Airway clearance held due to on bipap

## 2022-11-06 NOTE — Unmapped (Signed)
Pulmonary ICU Follow Up Consult Note     Date of Service: 11/06/2022  Requesting Physician: Lillia Carmel, MD   Requesting Service: Med Undesignated (MDX)  Reason for consultation: Comprehensive evaluation of   acute on chronic hypoxemic and hypercarbic respiratory failure secondary to likely pneumonia superimposed on pulmonary sarcoidosis with bronchiectasis and aspergilloma, severe persistent asthma, pulmonary arterial hypertension with cor pulmonale.    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 female with chronic hypoxemic and hypercarbic respiratory failure secondary to pulmonary sarcoidosis with bronchiectasis and aspergilloma, severe persistent asthma, pulmonary arterial hypertension with cor pulmonale.  She was recently hospitalized for acute on chronic hypoxemic and hypercarbic respiratory failure in the setting of hemoptysis.  She was diuresed and treated with antibiotics.  She saw Dr. Audrea Muscat in the pulmonary clinic 5 days ago, reporting that she felt to be at 90% of her baseline.     She presented today due to at least 48 hours of worsening shortness of breath and increased oxygen requirement.  She was using 6 L of oxygen at baseline but was needing to increase it to 8-10L in order to maintain her oxygen saturations.  Otherwise, she was desaturating to the 70s and 80s.  Over the past couple of days, she has been coughing up brown sputum with streaks, flecks, and small amounts of blood, not increased from prior.  She has found it difficult to cough up sputum in the absence of her airway clearance therapies.  She also noticed increased swelling of her lower extremities despite continuing to have a good diuretic response.  She typically sleeps propped up and had to increase her pillows further.  No nausea until she received IV fluid bolus in the ER.  She had subjective fevers at home but did not feel well enough to go and check her temperature.  Endorses soreness from coughing over her chest, left greater than right.  No GERD, dysphagia, regurgitation, or aspiration.     In the emergency room, she was given a 1 L fluid bolus over 15 minutes per sepsis protocol.  She was also given azithromycin, vancomycin and Zosyn.  Given Solu-Medrol 125 mg.  Upon arrival to the CCU, she required 11 to 12 L of supplemental oxygen after being moved to the bed.     Her prior lower respiratory cultures have primarily yielded oropharyngeal flora and more distantly Aspergillus [place].  Isolated Enterococcus faecium and Achromobacter species in July 2018.  Culture from August 2016 also had Achromobacter.  Isolated stenotrophomonas maltophilia from lung tissue in September 2016.  Her AFB cultures have also been negative with the exception of Mycobacterium fortuitum which is not typically pathogenic but can be seen in the setting of recurrent aspiration.  Has never isolated MRSA including from skin/soft tissue.    Problems addressed during this consult include acute on chronic respiratory failure, eosinophilic asthma, PAH with cor pulmonale, chronic bronchiectasis, sarcoidosis, recurrent hemoptysis . Based on these problems, the patient has high risk of morbidity/mortality which is commensurate w their risk of management options described below in the recommendations.    Assessment  Interval Events:  Overnight, blood pressure fell with maps in the 50s though normal mentation, vital signs, and asymptomatic.  Lactate was elevated at 2.7, up from 1.3 on admission.  There was concern for infection plus overdiuresis in the setting infection and was given slow infusion of IV fluids.  Was intended to have 500 mL but her a.m. labs showed increasing BNP so fluids were stopped after 350 mL.  Was on norepinephrine 2 mics from 4 AM to 6 AM.  Was given her normal Lasix.  Was made ICU status . Wore AVAPS most of day/night.  Did not sleep well last night but still feels like the AVAPS is very helpful.  No nausea.  Coughing up brown sputum though had small glob of blood after using Acapella.  Continues to have left chest wall soreness.         Impression: The patient has made minimal clinical progress since last encounter. I personally reviewed most recent pertinent labs, imaging and micro data, from 11/06/2022, which are noted in recommendations..        Recommendations       Malnutrition Assessment from RD:       PLAN BY SYSTEMS   Neurological:   Assessment: General physical deterioration.  Plan:  PT/OT    Pulmonary:  Assessment: Acute on chronic hypoxic and hypercapnic respiratory failure secondary to pneumonia superimposed on bronchiectasis and fibrosis 2/2 sarcoidosis; cor pulmonale.  History of hemoptysis  Plan:  - Noninvasive mechanical ventilation.  She qualifies for AVAPS based on her restrictive lung disease secondary to sarcoidosis with an FVC less than 50% predicted based on information sheet from [facility]  Non-invasive ventilation attestation:  This patient has severe chronic respiratory failure and restrictive lung disease. BiPAP has been considered and ruled out as it lacks the ability to adequately manage this patient's respiratory disease as it does not offer a volume targeted therapy. I am ordering NIV therapy because of its volume targeting AVAPS mode and faster responding AVAPS rate. The severity of this patient's disease is such that failure to provide noninvasive ventilator therapy on a daily basis will likely lead to future hospital re-admissions and life threatening situations.   - Oxygen by large bore nasal cannula.  - Sildenafil 40 mg tid.  - Continue airway clearance with nebulized normal saline and acapella.  - Close monitoring for hemoptysis.  Would benefit from VIR consultation later this week for elective bronchial artery embolization based on CT angio imaging from [facility] done March 2024 and imported into our system.  -Continue nebulized therapy of arformoterol and budesonide.  She favors continuing these as an outpatient rather than inhalers.    Cardiovascular:  Assessment: Pulmonary hypertension, severe. RV failure.  Plan:   - Diuresis.  Continue Lasix 80 mg IV 3 times daily with close monitoring of electrolytes.  - Daily standing weights.  Strict I's and O's.  Advised patient to record her intake and educated her that she does not need to force fluids if she is not thirsty.  - Trend BNP every 2 to 3 days.    Renal:  Assessment: Chronic kidney disease, stage 3 - GFR 30-59. Based on Cr/Cystatin from 11/05/22, eGFR of 34.  Plan:   - Diuresis as above.  - Close monitoring of electrolytes.  - Avoid nephrotoxic medications. Ensure medications are dosed for renal function.    Infectious disease:  Assessment:  left lung pneumonia - unknown organism. MRSA swab negative  Plan:   - Deescalate antibiotics by stopping  Vancomycin.  - If sputum culture does not isolate Pseudomonas, comfortable de-escalating to Augmentin or Unasyn.    Gastrointestinal:  Assessment: No issues.  Plan: Enteral nutrition.    Heme/Coag:  Assessment: Anemia - likely iron deficiency based on normal ferritin of 106 in the setting of markedly elevated CRP and ESR in March 2024.  Plan:  - DVT prophylaxis with enoxaparin.  Given history of hemoptysis and borderline renal function reasonable to dose at 30 mg subcu daily.  - Consider starting iron supplementation every other day.    Endocrine:  Assessment: Hyperglycemia iso diabetes after receiving solumedrol in ER.  Plan: Sliding scale insulin. Scheduled insulin.    Procedures:  None.    Advance Care Planning:  DNI/DNR -discussed that if she was intubated for respiratory failure, it would be highly unlikely that she would successfully liberate from the ventilator.  Additionally, given her right heart failure, high risk that she would have cardiac arrest around the time of intubation.  Most likely scenario that she would need CPR would be related to her pulmonary hypertension and unlikely to resolve with CPR, particularly without mechanical ventilation.  [name] is clear that she would not want CPR or mechanical ventilation.  She understands that we will continue to treat her current conditions aggressively with the hopes of avoiding worsening of her cardiopulmonary disease.    CHECKLIST   Can CVC be removed? N/A, no CVC present (including vascular catheter for HD or PLEX)   Can A-line be removed? N/A, no A-line present  Can Foley be removed? N/A, no Foley present  Mobility plan: Step 4 - Out of bed assessment (sit at bedside)    Feeding: Oral diet  Analgesia: No pain issues  Sedation SAT/SBT: N/A  Thrombembolic ppx: Enoxaparin  Head of bed >30 degrees: Yes  Ulcer ppx: Not indicated  Glucose within target range: Not in range, titrating medications    RASS at goal? N/A, not on sedation  [place] Agitation Assessment Scale (RASS) : 0 (11/06/2022  4:00 AM)     Can antipsychotics be stopped? N/A, not on antipsychotics       Designated Healthcare Decision Maker:  Ms. Arloa Koh current decisional capacity for healthcare decision-making is Full capacity. Her designated Educational psychologist) is/are   HCDM (patient stated preference): [name] - Media planner - [phone].    Disposition:  Transfer to intermediate care.    The recommendations outlined in this note were discussed w the primary team direct (in-person). Please do not hesitate to page [phone] [facility] consult) with questions. We appreciate the opportunity to assist in the care of this patient. We look forward to following with you.    Attestation     The patient is critically ill with the above problems. I personally spent  100 minutes, excluding procedures, in critical care time examining the patient, evaluating the hemodynamic, laboratory, and radiographic data, developing a comprehensive management plan, and serially assessing the patient's response to our critical care interventions.        Viona Gilmore MD, MPH  Assistant Professor of Medicine  Pulmonary & Critical Care Medicine  Clinical Director of the [facility]    Subjective & Objective     Vitals - past 24 hours  Temp:  [37.1 ??C (98.8 ??F)-37.3 ??C (99.2 ??F)] 37.3 ??C (99.2 ??F)  Heart Rate:  [65-98] 84  SpO2 Pulse:  [65-98] 80  Resp:  [16-47] 19  BP: (79-125)/(46-79) 125/78  FiO2 (%):  [44 %-60 %]  44 %  SpO2:  [87 %-100 %] 92 % Intake/Output  I/O last 3 completed shifts:  In: 2773.9 [P.O.:540; I.V.:8.9; IV Piggyback:2225]  Out: 2650 [Urine:2650]      Wt Readings from Last 6 Encounters:   11/05/22 86.8 kg (191 lb 5.8 oz)   10/31/22 84.8 kg (187 lb)   10/22/22 84 kg (185 lb 3 oz)   09/25/22 83.9 kg (185 lb)   06/12/22 89.8 kg (198 lb)   05/16/22 89.4 kg (197 lb)        Pertinent exam findings:   General appearance -sitting in bed appearing comfortable, nontoxic, and in no acute distress.  In good spirits.  Eyes - EOMI, PERRLA, anicteric sclerae, pink conjunctiva  Mouth -  moist mucous membranes  Neck - Trachea midline, normal neck movement  Lymphatics - not examined  Heart -regular rate and rhythm.  No murmur.  Sharp PT but able to split heart sounds.  No JVD appreciated.  Chest - equal expansion, coarse breath sounds but, no wheezes, rhonchi, or rales.  Good air entry.  Easy work of breathing without accessory muscle use.  No cough  Abdomen - soft, nontender, nondistended, no masses or organomegaly  Extremities - Trace to 1+ pitting lower extremity edema to at least mid-shin bilaterally. Mild clubbing. Unable to appreciate cyanosis 2/2 artificial nails.   Skin - normal coloration and turgor, no rashes, no suspicious skin lesions noted  Neurological - alert, oriented, normal speech, no focal findings or movement disorder noted    Relevant Imaging:  - CXR on 11/05/2022 revealed stable findings of pulmonary sarcoidosis including fibrosis and bullous disease with superimposed hazy/patchy consolidation in the left mid lung zone.  - CT chest with contrast on 10/21/22 revealed no pulmonary emboli or evidence of pulmonary hemorrhage.  Unchanged fibrotic pulmonary sarcoidosis with biapical upper lobe cystic bronchiectasis and scattered segmental and subsegmental fibrosis with bronchiectasis.  Unchanged bulky eggshell calcified mediastinal and hilar lymphadenopathy compatible with pulmonary sarcoidosis.  - ECHO on 10/23/2022 revealed normal EF of greater than 55% but severe pulmonary hypertension with severe dilation of the right ventricle and mildly reduced systolic function.     Current respiratory support:  AVAPS w/ goal TV 380, EPAP of 5, IPAP 15-30, set rate of 20, FiO2 50%    Arterial Blood Gas:   No results for input(s): SPECTYPEART, PHART, PCO2ART, PO2ART, HCO3ART, BEART, O2SATART in the last 24 hours.     Venous Blood Gas:   Recent Labs     Units 11/05/22  1119   PHVEN  7.37   PCO2VEN mm Hg 68*   PO2VEN mm Hg 17*   HCO3VEN mmol/L 34*   BEVEN  13.8*   O2SATVEN % 20.7*      Venous lactate  Lab Results   Component Value Date    LACTATE 2.6 (H) 11/06/2022    LACTATE 2.9 (H) 11/06/2022    LACTATE 2.7 (H) 11/06/2022    LACTATE 1.3 11/05/2022    LACTATE 2.4 (H) 10/21/2022     Cultures:  Blood Culture, Routine (no units)   Date Value   10/21/2022 Micrococcus species (A)   10/21/2022 No Growth at 5 days     Lower Respiratory Culture (no units)   Date Value   10/24/2022 OROPHARYNGEAL FLORA ISOLATED     WBC (10*9/L)   Date Value   11/06/2022 10.7          Other Labs:  Lab Results   Component Value Date    WBC 10.7  11/06/2022    HGB 9.6 (L) 11/06/2022    HCT 29.1 (L) 11/06/2022    PLT 200 11/06/2022     Lab Results   Component Value Date    NA 133 (L) 11/06/2022    K 4.4 11/06/2022    CL 96 (L) 11/06/2022    CO2 31.1 (H) 11/06/2022    BUN 28 (H) 11/06/2022    CREATININE 1.54 (H) 11/06/2022    GLU 542 (HH) 11/06/2022    CALCIUM 8.7 11/06/2022    MG 1.7 11/06/2022    PHOS 2.7 11/05/2022     Lab Results   Component Value Date    BILITOT 1.1 11/05/2022    BILIDIR <0.10 05/30/2019    PROT 7.2 11/05/2022    ALBUMIN 3.3 (L) 11/05/2022    ALT <7 (L) 11/05/2022    AST 9 11/05/2022    ALKPHOS 84 11/05/2022    GGT 60 (H) 05/31/2011     Lab Results   Component Value Date    INR 1.02 12/11/2019    APTT 30.9 12/11/2019       Allergies & Home Medications   Personally reviewed in Epic    Continuous Infusions:    NORepinephrine bitartrate-NS Stopped (11/06/22 0645)       Scheduled Medications:    [Provider Hold] amlodipine  5 mg Oral Daily    arformoterol  15 mcg Nebulization BID (RT)    azithromycin  500 mg Oral Q24H SCH    budesonide (PULMICORT) nebulizer solution  0.5 mg Nebulization BID (RT)    furosemide  80 mg Intravenous TID    gabapentin  200 mg Oral TID    insulin glargine  18 Units Subcutaneous Nightly    insulin lispro  0-20 Units Subcutaneous ACHS    ipratropium-albuterol  3 mL Nebulization Q4H (RT)    magnesium oxide  400 mg Oral BID    piperacillin-tazobactam (ZOSYN) IV (intermittent)  3.375 g Intravenous Q6H    posaconazole  200 mg Oral Daily    sildenafiL (pulm.hypertension)  40 mg Oral TID    sodium chloride  3 mL Nebulization 4x Daily (RT)    vancomycin  1,500 mg Intravenous Q24H       PRN medications:  acetaminophen, dextrose in water, glucagon, glucose, melatonin, ondansetron, polyethylene glycol, senna

## 2022-11-06 NOTE — Unmapped (Signed)
Daily Progress Note    Assessment/Plan:   Active Problems:    Pulmonary sarcoidosis (CMS-HCC)    HTN (hypertension)    Type 2 diabetes mellitus with hyperglycemia (CMS-HCC)    Acute on chronic hypoxic respiratory failure (CMS-HCC)    Chronic pulmonary aspergillosis (CMS-HCC)    Severe persistent asthma dependent on systemic steroids    Pulmonary hypertension (CMS-HCC)       LOS: 1 day     Karen Barber is a 57 y.o. female who is presenting to [facility] with acute on chronic hypoxemic respiratory failure, in the setting of the following pertinent/contributing co-morbidities: Bronchiectasis, reactive airway disease, history of left upper lobe lobectomy, aspergillosis on posaconazole, cor pulmonale, chronic mopped assist, sarcoidosis of lung, pulmonary hypertension.    Acute on chronic hypoxemic respiratory failure  Acute hypercapnic respiratory failure  Bronchiectasis exacerbation  Cor pulmonale  Reactive airway disease exacerbation  Chronic hemoptysis  Patient has been afebrile. Showing resolved leukocytosis with neutrophilic shift. Currently hemodynamically stable. On BiPAP overnight which patient was comfortable with, 6 L [place] during the day. Symptoms of improving sputum production, dyspnea, dark brown sputum, left-sided pleuritic chest pain present. Overall well-controlled with oxygen and antibiotics. Blood culture showing no growth at 24 hours. Negative RPP and MRSA PCR and screen. Sputum culture pending.     Plan:  Pulmonology following, recommendations appreciated:  - Noninvasive mechanical ventilation.  She qualifies for AVAPS based on her restrictive lung disease secondary to sarcoidosis with an FVC less than 50% predicted based on information sheet from [facility].  Non-invasive ventilation attestation:  This patient has severe chronic respiratory failure and restrictive lung disease. BiPAP has been considered and ruled out as it lacks the ability to adequately manage this patient's respiratory disease as it does not offer a volume targeted therapy. I am ordering NIV therapy because of its volume targeting AVAPS mode and faster responding AVAPS rate. The severity of this patient's disease is such that failure to provide noninvasive ventilator therapy on a daily basis will likely lead to future hospital re-admissions and life threatening situations.   - Oxygen by large bore nasal cannula.  - Sildenafil 40 mg tid.  - Continue airway clearance with nebulized normal saline and acapella.  - Close monitoring for hemoptysis.  Would benefit from VIR consultation later this week for elective bronchial artery embolization based on CT angio imaging from [facility] done March 2024 and imported into our system. *planning Wed/Thurs consult, when volume status more optimized*  -Continue nebulized therapy of arformoterol and budesonide.  She favors continuing these as an outpatient rather than inhalers.  - Continue Zosyn and Zithromax. Discontinue Vancomycin due to negative MRSA swab. If no Pseudomonas in sputum culture, de-escalate to Augmentin or Unasyn.     Pulmonary hypertension  RV Failure  Critical event overnight where patient was over-diuresed and had low pressures. S/p fluid resuscitation with total of 350 mL of fluids. Now back on lasix. Patient endorsed drinking fluids even when not thirsty. Suggested there is no need to force fluids unless needed.   - Continue Lasix 80 mg IV TID  - BID labs for K/creat monitoring  - Trend BNP every 2 to 3 days (8/20 or 8/21)  - Daily standing weights  - Strict I&Os    Type 2 diabetes mellitus with hyperglycemia   HgbA1c 8.3% July 2024, Home regimen includes glargine 18 units nightly, sliding scale insulin. s/p 1x IV Solu-medrol in ED. Known to have steroid induced hyperglycemia. 24 units of sliding scale  insulin after home 18u glargine did not control hyperglycemia  - Increase glargine to 30 units at night  - 1x 10 units of NPH to cover daytime 8/19  - Continue sliding scale insulin    CKD stage III. GFR currently likely approx 30 based on cystatin C  Baseline creatinine appears to be around 1.2-1.3  - Renally dose medications, avoid nephrotoxic agents  - Continue to monitor with BMP    Chronic pulmonary aspergillosis  - Continue home posaconazole 200 mg daily  - EKG on admission obtained with normal Qtc of 411 ms.     HTN  - Hold home amlodipine due to low pressure episode  - S/p norepinephrine 11/06/2022    PPX. Hold Lovenox/chemical DVT prophylaxis in setting of possible mopped assist, SCDs only, ambulation as tolerated     Diet - Consistent carb diet. Avoid sugary drinks     Code Status / HCDM  - DNI/DNR, Discussed with patient at the time of rounds 11/06/2022  -  HCDM (patient stated preference): Karen Barber - Domestic Partner - [phone]    _________________________    Subjective:   Stable overnight. Continued to have dark brown + red sputum in small amounts. Better appetite this morning. Good urine output.    Objective:   Vital signs in last 24 hours:  Temp:  [37.1 ??C (98.8 ??F)-37.6 ??C (99.6 ??F)] 37.3 ??C (99.2 ??F)  Heart Rate:  [65-98] 80  SpO2 Pulse:  [65-98] 80  Resp:  [16-47] 18  BP: (79-125)/(46-79) 125/78  MAP (mmHg):  [57-91] 91  FiO2 (%):  [50 %-60 %] 50 %  SpO2:  [87 %-100 %] 100 %    Intake/Output last 3 shifts:  I/O last 3 completed shifts:  In: 2773.9 [P.O.:540; I.V.:8.9; IV Piggyback:2225]  Out: 2650 [Urine:2650]  Intake/Output this shift:  No intake/output data recorded.    Physical Exam:  General: Comfortable, nontoxic and no acute distress  ENT: MMM  CV: RRR, Normal S1/S2, no m/r/g, distal pulses 2+, no JVD, no hepatojugular reflex  Pulm: Coarse breath sounds, mild rhonchi on lower right lobe, decreased aeration on the left. No wheezing or crackles. No increased work of breathing  Abd: NABS, soft, nontend  Extremities - 1+ pitting lower extremity edema to the knee bilaterally. Mild clubbing. No cyanosis  Psych: engaged, appropriate mood and affect    Attending attestation:  I attest that I have reviewed the student note and that the components of the history of the present illness, the physical exam, and the assessment and plan documented were performed by me or were performed in my presence by the student where I verified the documentation and performed (or re-performed) the exam and medical decision making.     I personally spent 55 minutes face-to-face and non-face-to-face in the care of this patient, which includes all pre, intra, and post visit time on the date of service.  All documented time was specific to the E/M visit and does not include any procedures that may have been performed.

## 2022-11-06 NOTE — Unmapped (Signed)
11/05/22 1713   NPPV   $$ NPPV  BIPAP/ CPAP   NPPV Status On   Adverse Reactions None   Interface Full face mask   Interface Size Medium   Skin Integrity excellent   Mepilex applied? no   NPPV Type BiPAP  (AVAPS)   Equipment ID [id number]   Device model V60   FiO2 (%) 50 %   IMAX 30   IMIN 15 cm H2O   EPAP 5 cm H2O   Target Tidal Volume 380 ml   Set Rate 20 breaths/min   Rise Time Setting 2   Insp Time 0.9 sec   NPPV Interventions   NPPV/CPAP Maintenance proper fit/secure   NPPV Readings   Total Rate 29 breaths/min   Vt (Exhaled) 477 mL   Minute Volume 19.1 L/min   PIP 19 cm H2O   Patient Leak 0 L/min   Alarm Check On;Functional/Tested   RR Min 8 bpm   High Vt 1000 cmH2O   Vt Min 100 ml   High Pressure 35 cm H2O   Pressure Minimum 2 cmH2O   Low Min Vol 2   Alarm Volume 10   Humidifier Checked n/a     Changed to AVAPS per Garner Nash at the bedside for future home settings.

## 2022-11-06 NOTE — Unmapped (Signed)
Care Management  Initial Transition Planning Assessment                  General  Care Manager assessed the patient by : Telephone conversation with patient, Medical record review, Discussion with Clinical Care team  Orientation Level: Oriented X4  Functional level prior to admission: Partially Assisted  Who provides care at home?: Family member  Level of assistance required: Transferring  Reason for referral: Discharge Planning, Home Health    Contact/Decision Maker  Extended Emergency Contact Information  Primary Emergency Contact: Austin,Ira  Address: [address]           [place], [place] [address] [place]  Home Phone: [phone]  Relation: Domestic Partner  Secondary Emergency Contact: Smail,Bryant  Address: [place], [place] [address] [place]  Mobile Phone: [phone]  Relation: Brother    Type of Residence: Mailing Address:  [address]  [place] [address]  Contacts: Accompanied by: Alone  Patient Phone Number: [phone] (home)           Medical Provider(s): Loran Senters, FNP  Reason for Admission: Admitting Diagnosis:  respiratory distress  Past Medical History:   has a past medical history of 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  Past Surgical History:   has a past surgical history that includes pr thoracoscopy w/thera wedge resexn initial unilat (Left, 12/03/2014); pr thoracoscopy surg tot pulm decort (Left, 12/14/2014); Lung lobectomy; pr right heart cath o2 saturation & cardiac output (N/A, 04/29/2019); pr amputation toe,mt-p jt (Right, 12/12/2019); and pr amputation toe,mt-p jt (Right, 06/16/2020).   Previous admit date: 10/21/2022    Primary Insurance- Payor: MEDICARE / Plan: MEDICARE PART A AND PART B / Product Type: *No Product type* /   Secondary Insurance - Secondary Insurance  [place]  Prescription Coverage -   Preferred Pharmacy - [facility] - [place] - [address]    Transportation home: [facility] Next of Kin / Guardian / POA / Advance Directives     HCDM (patient stated preference): [name] - Domestic Partner - [phone]    Advance Directive (Medical Treatment)  Does patient have an advance directive covering medical treatment?: Patient does not have advance directive covering medical treatment.  Reason patient does not have an advance directive covering medical treatment:: Patient needs follow-up to complete one. (Pt in the proces of doing her living will)    Health Care Decision Maker [HCDM] (Medical & Mental Health Treatment)  Healthcare Decision Maker: HCDM documented in the HCDM/Contact Info section.  Information offered on HCDM, Medical & Mental Health advance directives:: Patient declined information.    Advance Directive (Mental Health Treatment)  Does patient have an advance directive covering mental health treatment?: Patient does not have advance directive covering mental health treatment.  Reason patient does not have an advance directive covering mental health treatment:: Patient needs follow-up to complete one.    Readmission Information    Have you been hospitalized in the last 30 days?: Yes  Name of Hospital: [facility]  Were you being cared for at a skilled nursing facility:: No     What day  were you discharged from that hospital or facility?: 10/25/22  Number of Days between previous discharge and readmission date: 8-14 days    Type of Readmission: Unplanned readmission due to new medical issue/unrelated to previous admission    Readmission Source: Home   Did the following happen with your discharge?  Did you receive a follow-up/transition call?: Yes  from whom?: [facility]  Did you get your discharge medications?  : Yes  Did you go to your scheduled follow up appointment with your doctor on discharge?: Yes  Which services or equipment arranged after your discharge arrived?: NA  Did you understand your discharge instructions?: Yes   Contributing Factors for Readmission: Complex medical history, Failed outpatient treatment, Worsening Clinical Condition  Did you feel prepared for discharge?: Yes    Patient Information  Lives with: Alone  Type of Residence: Private residence   [place]  Support Systems/Concerns: Significant Other, Family Members  Responsibilities/Dependents at home?: No  Home Care services in place prior to admission?: Other (Comment) (In a process of getting HH aid)   Outpatient/Community Resources in place prior to admission: Clinic  Agency detail (Name/Phone #): Etheleen Nicks PCP  Equipment Currently Used at Home: walker, rolling, shower chair, commode chair, raised toilet seat, wheelchair, power   Currently receiving outpatient dialysis?: No     Financial Information     Need for financial assistance?: No     Social Determinants of Health  Social Determinants of Health     Financial Resource Strain: Low Risk  (11/06/2022)    Overall Financial Resource Strain (CARDIA)     Difficulty of Paying Living Expenses: Not hard at all   Internet Connectivity: Not on file   Food Insecurity: No Food Insecurity (11/06/2022)    Hunger Vital Sign     Worried About Running Out of Food in the Last Year: Never true     Ran Out of Food in the Last Year: Never true   Tobacco Use: Medium Risk (11/06/2022)    Patient History     Smoking Tobacco Use: Former     Smokeless Tobacco Use: Never     Passive Exposure: Not on file   Housing/Utilities: Low Risk  (11/06/2022)    Housing/Utilities     Within the past 12 months, have you ever stayed: outside, in a car, in a tent, in an overnight shelter, or temporarily in someone else's home (i.e. couch-surfing)?: No     Are you worried about losing your housing?: No     Within the past 12 months, have you been unable to get utilities (heat, electricity) when it was really needed?: No   Alcohol Use: Not At Risk (06/12/2022)    Alcohol Use     How often do you have a drink containing alcohol?: Never     How many drinks containing alcohol do you have on a typical day when you are drinking?: Not on file     How often do you have 5 or more drinks on one occasion?: Never   Transportation Needs: No Transportation Needs (11/06/2022)    PRAPARE - Transportation     Lack of Transportation (Medical): No     Lack of Transportation (Non-Medical): No   Substance Use: Low Risk  (06/12/2022)    Substance Use     Taken prescription drugs for non-medical reasons: Never     Taken illegal drugs: Never     Patient indicated they have taken drugs in the past year for non-medical  reasons: Yes, [positive answer(s)]: Not on file   Health Literacy: Low Risk  (08/07/2019)    Health Literacy     : Never   Physical Activity: Not on file   Interpersonal Safety: Unknown (11/06/2022)    Interpersonal Safety     Unsafe Where You Currently Live: Not on file     Physically Hurt by Anyone: Not on file     Abused by Anyone: Not on file   Stress: Not on file   Intimate Partner Violence: Not on file   Depression: Not at risk (11/02/2020)    PHQ-2     PHQ-2 Score: 0   Social Connections: Not on file       Complex Discharge Information    Is patient identified as a difficult/complex discharge?: No  Interventions:     Discharge Needs Assessment  Concerns to be Addressed: discharge planning, adjustment to diagnosis/illness, basic needs, coping/stress  Clinical Risk Factors: Principal Diagnosis: Cancer, Stroke, COPD, Heart Failure, AMI, Pneumonia, Joint Replacment, Multiple Diagnoses (Chronic), Functional Limitations, Readmission < 30 Days  Barriers to taking medications: No  Prior overnight hospital stay or ED visit in last 90 days: Yes  Anticipated Changes Related to Illness: none  Equipment Needed After Discharge: none    Discharge Facility/Level of Care Needs: other (see comments) (Home)    Readmission  Risk of Unplanned Readmission Score: UNPLANNED READMISSION SCORE: 16.85%  Predictive Model Details          17% (Medium)  Factor Value    Calculated 11/06/2022 16:03 28% Number of active inpatient medication orders 37    [place] Risk of Unplanned Readmission Model 12% Number of ED visits in last six months 2     10% ECG/EKG order present in last 6 months     9% Latest BUN high (28 mg/dL)     7% Imaging order present in last 6 months     7% Latest hemoglobin low (9.6 g/dL)     6% Phosphorous result present     5% Number of hospitalizations in last year 1     5% Age 59     5% Latest creatinine high (1.54 mg/dL)     4% Charlson Comorbidity Index 3     2% Future appointment scheduled     1% Current length of stay 1.137 days      Readmitted Within the Last 30 Days? (No if blank) Yes  Patient at risk for readmission?: Yes    Discharge Plan  Screen findings are: Discharge planning needs identified or anticipated (Comment). (Pt need HH)    Expected Discharge Date: 11/10/2022    Expected Transfer from Critical Care:  TBD    Quality data for continuing care services shared with patient and/or representative?: Yes  Patient and/or family were provided with choice of facilities / services that are available and appropriate to meet post hospital care needs?: Yes   List choices in order highest to lowest preferred, if applicable. : No preference per pt    Initial Assessment complete?: Yes

## 2022-11-06 NOTE — Unmapped (Signed)
Pt upgraded from Stepdown to ICU status overnight. Pt alert and oriented, though fatigued. Pt remained on BiPap for majority of shift with FiO2 titrated by RT per oxygen saturation goal. Pt was tachypneic with shallow respirations for a majority of the night. MD notified for high bedtime blood glucose; sliding scale insulin and subcutaneous lantus given per order. Pt's BP trended down overnight, particularly while sleeping; MD notified, slow fluid bolus given per order, norepi initiated per order and titrated to maintain MAP goal. Pt given IV lasix with high urine output via purewick. Plan of care to continue.   Problem: Adult Inpatient Plan of Care  Goal: Plan of Care Review  Outcome: Progressing  Goal: Patient-Specific Goal (Individualized)  Outcome: Progressing  Goal: Absence of Hospital-Acquired Illness or Injury  Outcome: Progressing  Intervention: Identify and Manage Fall Risk  Recent Flowsheet Documentation  Taken 11/05/2022 2000 by Haze Rushing, RN  Safety Interventions:   fall reduction program maintained   low bed   lighting adjusted for tasks/safety   aspiration precautions   bleeding precautions   infection management   nonskid shoes/slippers when out of bed  Intervention: Prevent Skin Injury  Recent Flowsheet Documentation  Taken 11/05/2022 2000 by Haze Rushing, RN  Positioning for Skin: Left  Device Skin Pressure Protection:   absorbent pad utilized/changed   adhesive use limited   positioning supports utilized   skin-to-device areas padded  Skin Protection:   adhesive use limited   cleansing with dimethicone incontinence wipes   incontinence pads utilized   skin-to-device areas padded   transparent dressing maintained   tubing/devices free from skin contact  Intervention: Prevent Infection  Recent Flowsheet Documentation  Taken 11/05/2022 2000 by Haze Rushing, RN  Infection Prevention:   cohorting utilized   hand hygiene promoted   rest/sleep promoted   single patient room provided equipment surfaces disinfected  Goal: Optimal Comfort and Wellbeing  Outcome: Progressing  Goal: Readiness for Transition of Care  Outcome: Progressing  Goal: Rounds/Family Conference  Outcome: Progressing     Problem: Infection  Goal: Absence of Infection Signs and Symptoms  Outcome: Progressing  Intervention: Prevent or Manage Infection  Recent Flowsheet Documentation  Taken 11/05/2022 2000 by Haze Rushing, RN  Infection Management: aseptic technique maintained     Problem: Self-Care Deficit  Goal: Improved Ability to Complete Activities of Daily Living  Outcome: Progressing     Problem: Fall Injury Risk  Goal: Absence of Fall and Fall-Related Injury  Outcome: Progressing  Intervention: Promote Injury-Free Environment  Recent Flowsheet Documentation  Taken 11/05/2022 2000 by Haze Rushing, RN  Safety Interventions:   fall reduction program maintained   low bed   lighting adjusted for tasks/safety   aspiration precautions   bleeding precautions   infection management   nonskid shoes/slippers when out of bed

## 2022-11-06 NOTE — Unmapped (Signed)
11/06/22 0818   Vitals   Heart Rate 84   Resp 19   SpO2 92 %   Oxygen Therapy/Pulse Ox   O2 Device Large bore nasal cannula (cool high flow)   O2 Therapy Oxygen humidified   O2 Flow Rate (L/min) 6 L/min  (home regimen per patient)   $$ O2 per day charge Yes   FiO2 (%) 44 %   ROX Index   ROX Index Score 11     Bipap placed in standby and placed on 6 lpm after being on Bipap since yesterday. She only removed for meals.

## 2022-11-07 LAB — BASIC METABOLIC PANEL
ANION GAP: 5 mmol/L (ref 5–14)
BLOOD UREA NITROGEN: 28 mg/dL — ABNORMAL HIGH (ref 9–23)
BUN / CREAT RATIO: 18
CALCIUM: 9.3 mg/dL (ref 8.7–10.4)
CHLORIDE: 100 mmol/L (ref 98–107)
CO2: 34.3 mmol/L — ABNORMAL HIGH (ref 20.0–31.0)
CREATININE: 1.52 mg/dL — ABNORMAL HIGH
EGFR CKD-EPI (2021) FEMALE: 39 mL/min/{1.73_m2} — ABNORMAL LOW (ref >=60)
GLUCOSE RANDOM: 245 mg/dL — ABNORMAL HIGH (ref 70–179)
POTASSIUM: 4.2 mmol/L (ref 3.4–4.8)
SODIUM: 139 mmol/L (ref 135–145)

## 2022-11-07 LAB — CBC
HEMATOCRIT: 27.4 % — ABNORMAL LOW (ref 34.0–44.0)
HEMOGLOBIN: 8.7 g/dL — ABNORMAL LOW (ref 11.3–14.9)
MEAN CORPUSCULAR HEMOGLOBIN CONC: 31.9 g/dL — ABNORMAL LOW (ref 32.0–36.0)
MEAN CORPUSCULAR HEMOGLOBIN: 25.8 pg — ABNORMAL LOW (ref 25.9–32.4)
MEAN CORPUSCULAR VOLUME: 80.7 fL (ref 77.6–95.7)
MEAN PLATELET VOLUME: 9 fL (ref 6.8–10.7)
PLATELET COUNT: 205 10*9/L (ref 150–450)
RED BLOOD CELL COUNT: 3.39 10*12/L — ABNORMAL LOW (ref 3.95–5.13)
RED CELL DISTRIBUTION WIDTH: 15.2 % (ref 12.2–15.2)
WBC ADJUSTED: 12.9 10*9/L — ABNORMAL HIGH (ref 3.6–11.2)

## 2022-11-07 LAB — MAGNESIUM: MAGNESIUM: 1.7 mg/dL (ref 1.6–2.6)

## 2022-11-07 MED ADMIN — insulin lispro (HumaLOG) injection 0-20 Units: 0-20 [IU] | SUBCUTANEOUS | @ 16:00:00

## 2022-11-07 MED ADMIN — gabapentin (NEURONTIN) capsule 200 mg: 200 mg | ORAL | @ 01:00:00

## 2022-11-07 MED ADMIN — piperacillin-tazobactam (ZOSYN) 3.375 g in sodium chloride 0.9 % (NS) 100 mL IVPB-MBP: 3.375 g | INTRAVENOUS | @ 01:00:00 | Stop: 2022-11-10

## 2022-11-07 MED ADMIN — ipratropium-albuterol (DUO-NEB) 0.5-2.5 mg/3 mL nebulizer solution 3 mL: 3 mL | RESPIRATORY_TRACT | @ 12:00:00

## 2022-11-07 MED ADMIN — sildenafiL (pulm.hypertension) (REVATIO) tablet 40 mg: 40 mg | ORAL | @ 13:00:00

## 2022-11-07 MED ADMIN — guaiFENesin (ROBITUSSIN) oral syrup: 200 mg | ORAL | @ 02:00:00

## 2022-11-07 MED ADMIN — ipratropium-albuterol (DUO-NEB) 0.5-2.5 mg/3 mL nebulizer solution 3 mL: 3 mL | RESPIRATORY_TRACT | @ 16:00:00

## 2022-11-07 MED ADMIN — ipratropium-albuterol (DUO-NEB) 0.5-2.5 mg/3 mL nebulizer solution 3 mL: 3 mL | RESPIRATORY_TRACT

## 2022-11-07 MED ADMIN — sildenafiL (pulm.hypertension) (REVATIO) tablet 40 mg: 40 mg | ORAL | @ 01:00:00

## 2022-11-07 MED ADMIN — sodium chloride 0.9 % nebulizer solution 3 mL: 3 mL | RESPIRATORY_TRACT

## 2022-11-07 MED ADMIN — budesonide (PULMICORT) nebulizer solution 0.5 mg: .5 mg | RESPIRATORY_TRACT

## 2022-11-07 MED ADMIN — furosemide (LASIX) injection 80 mg: 80 mg | INTRAVENOUS | @ 22:00:00

## 2022-11-07 MED ADMIN — arformoterol (BROVANA) nebulizer solution 15 mcg/2 mL: 15 ug | RESPIRATORY_TRACT

## 2022-11-07 MED ADMIN — insulin lispro (HumaLOG) injection 0-20 Units: 0-20 [IU] | SUBCUTANEOUS | @ 21:00:00

## 2022-11-07 MED ADMIN — sodium chloride 0.9 % nebulizer solution 3 mL: 3 mL | RESPIRATORY_TRACT | @ 12:00:00

## 2022-11-07 MED ADMIN — budesonide (PULMICORT) nebulizer solution 0.5 mg: .5 mg | RESPIRATORY_TRACT | @ 12:00:00

## 2022-11-07 MED ADMIN — arformoterol (BROVANA) nebulizer solution 15 mcg/2 mL: 15 ug | RESPIRATORY_TRACT | @ 12:00:00

## 2022-11-07 MED ADMIN — sodium chloride 0.9 % nebulizer solution 3 mL: 3 mL | RESPIRATORY_TRACT | @ 20:00:00

## 2022-11-07 MED ADMIN — menthol (COUGH DROPS) lozenge 1 lozenge: 1 | ORAL | @ 02:00:00

## 2022-11-07 MED ADMIN — piperacillin-tazobactam (ZOSYN) 3.375 g in sodium chloride 0.9 % (NS) 100 mL IVPB-MBP: 3.375 g | INTRAVENOUS | @ 12:00:00 | Stop: 2022-11-10

## 2022-11-07 MED ADMIN — piperacillin-tazobactam (ZOSYN) 3.375 g in sodium chloride 0.9 % (NS) 100 mL IVPB-MBP: 3.375 g | INTRAVENOUS | @ 19:00:00 | Stop: 2022-11-10

## 2022-11-07 MED ADMIN — insulin glargine (LANTUS) injection 35 Units: 35 [IU] | SUBCUTANEOUS | @ 02:00:00

## 2022-11-07 MED ADMIN — posaconazole (NOXAFIL) delayed released tablet 200 mg: 200 mg | ORAL | @ 13:00:00

## 2022-11-07 MED ADMIN — piperacillin-tazobactam (ZOSYN) 3.375 g in sodium chloride 0.9 % (NS) 100 mL IVPB-MBP: 3.375 g | INTRAVENOUS | @ 07:00:00 | Stop: 2022-11-10

## 2022-11-07 MED ADMIN — insulin lispro (HumaLOG) injection 5 Units: 5 [IU] | SUBCUTANEOUS | @ 16:00:00

## 2022-11-07 MED ADMIN — furosemide (LASIX) injection 80 mg: 80 mg | INTRAVENOUS | @ 16:00:00

## 2022-11-07 MED ADMIN — ipratropium-albuterol (DUO-NEB) 0.5-2.5 mg/3 mL nebulizer solution 3 mL: 3 mL | RESPIRATORY_TRACT | @ 20:00:00

## 2022-11-07 MED ADMIN — ipratropium-albuterol (DUO-NEB) 0.5-2.5 mg/3 mL nebulizer solution 3 mL: 3 mL | RESPIRATORY_TRACT | @ 08:00:00

## 2022-11-07 MED ADMIN — insulin lispro (HumaLOG) injection 0-20 Units: 0-20 [IU] | SUBCUTANEOUS | @ 01:00:00

## 2022-11-07 MED ADMIN — insulin lispro (HumaLOG) injection 5 Units: 5 [IU] | SUBCUTANEOUS | @ 21:00:00

## 2022-11-07 MED ADMIN — furosemide (LASIX) injection 80 mg: 80 mg | INTRAVENOUS | @ 01:00:00

## 2022-11-07 MED ADMIN — guaiFENesin (ROBITUSSIN) oral syrup: 200 mg | ORAL | @ 07:00:00

## 2022-11-07 MED ADMIN — ipratropium-albuterol (DUO-NEB) 0.5-2.5 mg/3 mL nebulizer solution 3 mL: 3 mL | RESPIRATORY_TRACT | @ 04:00:00

## 2022-11-07 MED ADMIN — gabapentin (NEURONTIN) capsule 200 mg: 200 mg | ORAL | @ 18:00:00

## 2022-11-07 MED ADMIN — insulin lispro (HumaLOG) injection 0-20 Units: 0-20 [IU] | SUBCUTANEOUS | @ 12:00:00

## 2022-11-07 MED ADMIN — azithromycin (ZITHROMAX) tablet 500 mg: 500 mg | ORAL | @ 13:00:00 | Stop: 2022-11-07

## 2022-11-07 MED ADMIN — menthol (COUGH DROPS) lozenge 1 lozenge: 1 | ORAL | @ 16:00:00

## 2022-11-07 MED ADMIN — furosemide (LASIX) injection 80 mg: 80 mg | INTRAVENOUS | @ 10:00:00

## 2022-11-07 MED ADMIN — gabapentin (NEURONTIN) capsule 200 mg: 200 mg | ORAL | @ 13:00:00

## 2022-11-07 MED ADMIN — insulin lispro (HumaLOG) injection 5 Units: 5 [IU] | SUBCUTANEOUS | @ 12:00:00

## 2022-11-07 MED ADMIN — sildenafiL (pulm.hypertension) (REVATIO) tablet 40 mg: 40 mg | ORAL | @ 18:00:00

## 2022-11-07 MED ADMIN — sodium chloride 0.9 % nebulizer solution 3 mL: 3 mL | RESPIRATORY_TRACT | @ 16:00:00

## 2022-11-07 NOTE — Unmapped (Signed)
The patient's respiratory status this shift has been improving .     The patient is currently on nasal cannula.with AVAPS overnight    Lung sounds are   diminished    The patients cough has been non-productive.    Additional interventions will occur as needed.

## 2022-11-07 NOTE — Unmapped (Signed)
Spiritual Care Visit Note    Assessment Summary:   Spiritual care encounter with prayer and conversation of meaning and hope in regards to illness.  Asks for God's carrying her during this time.      Clinical Encounter Type  Type of Visit: Initial visit  Care Provided To: Patient and family together  Referral Source: Physician  On-Call Visit?: Yes  Reason for Visit: Routine spiritual support  Minutes Spent: 20 minutes       Spiritual Assessment  Faith: [employer]  Presenting Concern(s): Meaning/Purpose, Grief and loss, Religious or spiritual distress  Beliefs: Prayer.  THe patient would like regular Spiritual care visits by the chaplain an dprayer with her during her length of stay.  Emotions: Tired.  Patient is working through multiple health care deficits.  Community: Faith community, Family  Needs: Rest from weariness and strength to be healthier.  Hopes: Healing  Resources: Faith. Family.  Health care treatment.  Health care team.    Spiritual Care Interventions  Interventions Made: Established relationship of care and support, Compassionate presence, Reflective listening, Supported patient's sources of spiritual strength, Prayer, Non-anxious presence, Self-care teaching/encouragement  Outcomes: Appreciated Chaplain visit, Open to continued care, Expressed emotion, Acceptance/understands situation      Spiritual Care Plan  Spiritual Care Plan: Chaplain will continue to follow.     Signed: Marcelina Morel, Chaplain 3:37 PM 11/07/2022

## 2022-11-07 NOTE — Unmapped (Signed)
Pt on 5-6L [facility]NC during the day.  Wore AVAPS overnight.  Received all scheduled breathing txs.  Acapella done for airway clearance.  Pt reports small amounts of thick/brown, sometimes scant blood, secretions.      Problem: Comorbidity Management  Goal: Maintenance of COPD Symptom Control  Outcome: Ongoing - Unchanged

## 2022-11-07 NOTE — Unmapped (Signed)
Spiritual Care Visit Note    Assessment Summary: Spiritual Care offered and prayer rendered by chaplain.      Clinical Encounter Type  Type of Visit: Initial visit  Care Provided To: Patient and family together  Referral Source: Physician  On-Call Visit?: Yes  Reason for Visit: Routine spiritual support  Minutes Spent: 20 minutes       Spiritual Assessment  Faith: [employer]  Presenting Concern(s): Meaning/Purpose, Grief and loss, Religious or spiritual distress  Beliefs: Prayer.  THe patient would like regular Spiritual care visits by the chaplain an dprayer with her during her length of stay.  Emotions: Tired.  Patient is working through multiple health care deficits.  Community: Faith community, Family  Needs: Rest from weariness and strength to be healthier.  Hopes: Healing  Resources: Faith. Family.  Health care treatment.  Health care team.    Spiritual Care Interventions  Interventions Made: Established relationship of care and support, Compassionate presence, Reflective listening, Supported patient's sources of spiritual strength, Prayer, Non-anxious presence, Self-care teaching/encouragement  Outcomes: Appreciated Chaplain visit, Open to continued care, Expressed emotion, Acceptance/understands situation      Spiritual Care Plan  Spiritual Care Plan: Chaplain will continue to follow.       Signed: Marcelina Morel, Chaplain 3:13 PM 11/07/2022

## 2022-11-07 NOTE — Unmapped (Signed)
Pt remains stepdown status. A&Ox4, pleasant and cooperative with plan of care. VSS, afebrile, sinus rhythm on tele. Continues on 6L large bore cannula, still experiencing desats with activity but fewer episodes than yesterday. Good urine output via purewick, appetite improved and glucose levels also improved. Pt still experiencing persistent cough, notes occasional sputum but no hemoptysis. Able to reposition herself in bed for skin integrity. SCDs on and working. Bed in locked and low position, call bell in reach.    Problem: Adult Inpatient Plan of Care  Goal: Plan of Care Review  Outcome: Progressing  Goal: Patient-Specific Goal (Individualized)  Outcome: Progressing  Goal: Absence of Hospital-Acquired Illness or Injury  Outcome: Progressing  Intervention: Identify and Manage Fall Risk  Recent Flowsheet Documentation  Taken 11/07/2022 0800 by Yvonna Alanis, RN  Safety Interventions:   aspiration precautions   bed alarm   bleeding precautions   commode/urinal/bedpan at bedside   environmental modification   fall reduction program maintained   infection management   lighting adjusted for tasks/safety   low bed   nonskid shoes/slippers when out of bed  Intervention: Prevent Skin Injury  Recent Flowsheet Documentation  Taken 11/07/2022 1200 by Yvonna Alanis, RN  Positioning for Skin: Right  Taken 11/07/2022 1000 by Yvonna Alanis, RN  Positioning for Skin: Left  Taken 11/07/2022 0800 by Yvonna Alanis, RN  Positioning for Skin: Supine/Back  Device Skin Pressure Protection:   absorbent pad utilized/changed   adhesive use limited   positioning supports utilized   pressure points protected   skin-to-device areas padded   skin-to-skin areas padded   tubing/devices free from skin contact  Skin Protection:   adhesive use limited   cleansing with dimethicone incontinence wipes   incontinence pads utilized   silicone foam dressing in place   skin-to-device areas padded   skin-to-skin areas padded tubing/devices free from skin contact  Intervention: Prevent Infection  Recent Flowsheet Documentation  Taken 11/07/2022 0800 by Yvonna Alanis, RN  Infection Prevention:   cohorting utilized   environmental surveillance performed   equipment surfaces disinfected   hand hygiene promoted   personal protective equipment utilized   rest/sleep promoted   single patient room provided   visitors restricted/screened  Goal: Optimal Comfort and Wellbeing  Outcome: Progressing  Goal: Readiness for Transition of Care  Outcome: Progressing  Goal: Rounds/Family Conference  Outcome: Progressing     Problem: Infection  Goal: Absence of Infection Signs and Symptoms  Outcome: Progressing  Intervention: Prevent or Manage Infection  Recent Flowsheet Documentation  Taken 11/07/2022 0800 by Yvonna Alanis, RN  Infection Management: aseptic technique maintained     Problem: Self-Care Deficit  Goal: Improved Ability to Complete Activities of Daily Living  Outcome: Progressing     Problem: Fall Injury Risk  Goal: Absence of Fall and Fall-Related Injury  Outcome: Progressing  Intervention: Promote Injury-Free Environment  Recent Flowsheet Documentation  Taken 11/07/2022 0800 by Yvonna Alanis, RN  Safety Interventions:   aspiration precautions   bed alarm   bleeding precautions   commode/urinal/bedpan at bedside   environmental modification   fall reduction program maintained   infection management   lighting adjusted for tasks/safety   low bed   nonskid shoes/slippers when out of bed     Problem: Comorbidity Management  Goal: Maintenance of Asthma Control  Outcome: Progressing  Goal: Maintenance of COPD Symptom Control  Outcome: Progressing  Goal: Blood Glucose Levels Within Targeted Range  Outcome: Progressing  Intervention: Monitor and Manage Glycemia  Recent Flowsheet Documentation  Taken 11/07/2022 1200 by Yvonna Alanis, RN  Glycemic Management:   blood glucose monitored   supplemental insulin given  Taken 11/07/2022 1000 by Yvonna Alanis, RN  Glycemic Management: blood glucose monitored  Taken 11/07/2022 0800 by Yvonna Alanis, RN  Glycemic Management:   blood glucose monitored   supplemental insulin given  Goal: Blood Pressure in Desired Range  Outcome: Progressing

## 2022-11-07 NOTE — Unmapped (Signed)
Daily Progress Note    Assessment/Plan:   Active Problems:    Pulmonary sarcoidosis (CMS-HCC)    HTN (hypertension)    Type 2 diabetes mellitus with hyperglycemia (CMS-HCC)    Acute on chronic hypoxic respiratory failure (CMS-HCC)    Chronic pulmonary aspergillosis (CMS-HCC)    Severe persistent asthma dependent on systemic steroids    Pulmonary hypertension (CMS-HCC)       LOS: 2 days     Karen Barber is a 57 y.o. female who is presenting to [facility] with acute on chronic hypoxemic respiratory failure in the setting of potential infection complicated by history of bronchiectasis, reactive airway disease, and aspergillosis on posaconazole. Patient's hospital course has been complicated by fluid overload in the setting of cor pulmonale.     Acute on chronic hypoxemic respiratory failure  Acute hypercapnic respiratory failure  Potential infection from bronchiectasis and RAD exacerbation  Patient continues to be afebrile. Leukocytosis is present but likely due to one time dose of solu-medrol in the ED on 8/18. Currently hemodynamically stable. Patient is currently on AVAPS overnight and 6L [place] during the day, both of which she is comfortable with. Sputum is continuing to improve as the patient now endorses seeing more clear sputum than dark brown/burgundy sputum. Blood culture showing no growth at 24 hours. Negative RPP and MRSA PCR and screen. AFB smear negative. AFB and Sputum culture pending. Will adjust antibiotics pending the culture results.     Plan:  Pulmonology following, recommendations appreciated:  - Oxygen support with AVAPS at night and Raymond during the day, wean as tolerated.   - Airway clearance with nebulized normal saline and acapella.  - RAD nebulizer therapy with Pulmicort, Brovana, and DuoNebs  - Continue Zosyn and Zithromax. Adjust as needed pending culture results  - s/p 1x Rocephin (8/18), Vancomycin (8/18-8/19)  - VIR consultation Wed/Thurs when volume status is more optimized for elective bronchial artery embolization based on CT angio imaging from [facility] done March 2024    Cor Pulmonale  Pulmonary hypertension  RV Failure  Maintaining good UOP and did well on limiting fluid intake unless needed. Physical exam showing stable/improving fluid status. Potassium repletion overnight showing improved K levels. Continue to monitor BMP during diuresis.   - Continue Lasix 80 mg IV TID  - Daily AM BMP for K/creat monitoring  - Trend BNP every 2 to 3 days (next: 8/21)  - Daily standing weights  - Strict I&Os  - Sildenafil 40 mg TID for pulmonary hypertension    Type 2 diabetes mellitus with hyperglycemia   HgbA1c 8.3% July 2024, Home regimen of glargine 18 units nightly, sliding scale insulin. s/p 1x IV Solu-medrol in ED. Known to have steroid induced hyperglycemia. Continued hyperglycemia. Adjusted insulin regimen overnight which showed improvement. Will continue to monitor.   - Increase glargine to 35 units at night  - Added meal-time lispro 5 units   - Additional sliding scale insulin ACHS  - s/p 1x 10 units of NPH to cover daytime 8/19    CKD stage III   GFR currently likely approx 30 based on cystatin C  Baseline creatinine appears to be around 1.2-1.3. Creatinine stable and hovering around 1.5  - Renally dose medications, avoid nephrotoxic agents  - Continue to monitor with BMP  - Consider stopping Lasix once not fluid overloaded    Anemia  Hgb has been trending down slowly but consistently. Had previous anemia before but with normal ferritin and iron levels. Could potentially be due to  iron deficiency anemia or due to constant blood draws. Dilution now unlikely due to no maintenance fluids. Will work up  - AM Iron panel, ferritin, B12, folate labs  - If Iron deficiency, await IV iron unless inflammation/infection resolved.     Chronic pulmonary aspergillosis  AFB smear negative, AFB culture pending  - Continue home posaconazole 200 mg daily  - EKG on admission obtained with normal Qtc of 411 ms    HTN  - Hold home amlodipine due to low pressure episode  - S/p norepinephrine 11/06/2022    PPX. Hold Lovenox/chemical DVT prophylaxis in setting of possible mopped assist, SCDs only, ambulation as tolerated     Diet - Consistent carb diet. Avoid sugary drinks     Code Status / HCDM  - DNI/DNR, Discussed with patient at the time of rounds 11/06/2022  -  HCDM (patient stated preference): Karen Barber - Domestic Partner - [phone]    _________________________    Subjective:   Stable overnight. Continued to have dark brown sputum in small amounts. However, no additional red sputum. Continuing to improve her appetite. Good UOP and one BM overnight. No increased work of breathing or shortness of breath.     Pt seen and discussed w/ Pulmonary - Dr. Garner Nash    Objective:   Vital signs in last 24 hours:  Temp:  [36.4 ??C (97.5 ??F)-36.7 ??C (98 ??F)] 36.7 ??C (98 ??F)  Heart Rate:  [67-97] 77  SpO2 Pulse:  [80-99] 99  Resp:  [15-45] 22  BP: (91-120)/(54-68) 109/54  MAP (mmHg):  [66-80] 71  FiO2 (%):  [40 %] 40 %  SpO2:  [87 %-100 %] 100 %    Intake/Output last 3 shifts:  I/O last 3 completed shifts:  In: 1843.9 [P.O.:1260; I.V.:8.9; IV Piggyback:575]  Out: 5700 [Urine:5700]  Intake/Output this shift:  No intake/output data recorded.    Physical Exam:  General: Comfortable, nontoxic and no acute distress  ENT: MMM  CV: RRR, Normal S1/S2, no m/r/g, distal pulses 2+, no JVD, no hepatojugular reflex  Pulm: Decreased aeration on the left. No wheezing, rhonchi, or crackles. No increased work of breathing  Abd: NABS, soft, nontend  Extremities - Trace pitting lower extremity edema to the knee bilaterally. Mild clubbing. No cyanosis  Psych: engaged, appropriate mood and affect    Attending attestation:  I attest that I have reviewed the student note and that the components of the history of the present illness, the physical exam, and the assessment and plan documented were performed by me or were performed in my presence by the student where I verified the documentation and performed (or re-performed) the exam and medical decision making.     I personally spent 55 minutes face-to-face and non-face-to-face in the care of this patient, which includes all pre, intra, and post visit time on the date of service.  All documented time was specific to the E/M visit and does not include any procedures that may have been performed.

## 2022-11-07 NOTE — Unmapped (Addendum)
General Pulmonary Team Follow Up Consult Note     Date of Service: 11/07/2022  Requesting Physician: Lillia Carmel, MD   Requesting Service: Med Undesignated (MDX)  Reason for consultation: Comprehensive evaluation of acute on chronic hypoxemic and hypercarbic respiratory failure secondary to likely pneumonia superimposed on pulmonary sarcoidosis with bronchiectasis and aspergilloma, severe persistent asthma, pulmonary arterial hypertension with cor pulmonale     Hospital Problems:  Active Problems:    Pulmonary sarcoidosis (CMS-HCC)    HTN (hypertension)    Type 2 diabetes mellitus with hyperglycemia (CMS-HCC)    Acute on chronic hypoxic respiratory failure (CMS-HCC)    Chronic pulmonary aspergillosis (CMS-HCC)    Severe persistent asthma dependent on systemic steroids    Pulmonary hypertension (CMS-HCC)      HPI: Karen Barber is a 57 y.o. female with chronic hypoxemic and hypercarbic respiratory failure secondary to pulmonary sarcoidosis with bronchiectasis and aspergilloma, severe persistent asthma, pulmonary arterial hypertension with cor pulmonale.  She was recently hospitalized for acute on chronic hypoxemic and hypercarbic respiratory failure in the setting of hemoptysis.  She was diuresed and treated with antibiotics.  She saw Dr. Audrea Muscat in the pulmonary clinic 5 days ago, reporting that she felt to be at 90% of her baseline.     She presented today due to at least 48 hours of worsening shortness of breath and increased oxygen requirement.  She was using 6 L of oxygen at baseline but was needing to increase it to 8-10L in order to maintain her oxygen saturations.  Otherwise, she was desaturating to the 70s and 80s.  Over the past couple of days, she has been coughing up brown sputum with streaks, flecks, and small amounts of blood, not increased from prior.  She has found it difficult to cough up sputum in the absence of her airway clearance therapies.  She also noticed increased swelling of her lower extremities despite continuing to have a good diuretic response.  She typically sleeps propped up and had to increase her pillows further.  No nausea until she received IV fluid bolus in the ER.  She had subjective fevers at home but did not feel well enough to go and check her temperature.  Endorses soreness from coughing over her chest, left greater than right.  No GERD, dysphagia, regurgitation, or aspiration.     In the emergency room, she was given a 1 L fluid bolus over 15 minutes per sepsis protocol.  She was also given azithromycin, vancomycin and Zosyn.  Given Solu-Medrol 125 mg.  Upon arrival to the CCU, she required 11 to 12 L of supplemental oxygen after being moved to the bed.     Her prior lower respiratory cultures have primarily yielded oropharyngeal flora and more distantly Aspergillus [place].  Isolated Enterococcus faecium and Achromobacter species in July 2018.  Culture from August 2016 also had Achromobacter.  Isolated stenotrophomonas maltophilia from lung tissue in September 2016.  Her AFB cultures have also been negative with the exception of Mycobacterium fortuitum which is not typically pathogenic but can be seen in the setting of recurrent aspiration.  Has never isolated MRSA including from skin/soft tissue.    Problems addressed during this consult include acute on chronic respiratory failure, eosinophilic asthma, PAH with cor pulmonale, chronic bronchiectasis, sarcoidosis, recurrent hemoptysis. Based on these problems, the patient has high risk of morbidity/mortality which is commensurate w their risk of management options described below in the recommendations.    Assessment      Interval  Events:  Doing well overnight with the AVAPS. Worked with PT yesterday. Was able to ambulate to door and back and maintain saturations around 90% on 6L.  Continues to cough up brown sputum.  No longer coughing up any blood.  Is able to wiggle her toes, noting that this means her edema is improving.  No longer experiencing chest soreness.       Impression: The patient has made significant clinical progress since last encounter. I personally reviewed most recent pertinent labs, imaging and micro data, from 11/07/2022, which are noted in recommendations.        Recommendations     Acute on chronic hypoxic and hypercapnic respiratory failure secondary to pneumonia superimposed on bronchiectasis and fibrosis 2/2 sarcoidosis; cor pulmonale.  History of hemoptysis  - Noninvasive mechanical ventilation.  She qualifies for AVAPS based on her restrictive lung disease secondary to sarcoidosis with an FVC less than 50% predicted based on information sheet from [facility].  Non-invasive ventilation attestation:  This patient has severe chronic respiratory failure and restrictive lung disease (2/2 bronchiectasis and fibrosis from sarcoidosis). BiPAP has been considered and ruled out as it lacks the ability to adequately manage this patient's respiratory disease as it does not offer a volume targeted therapy. I am ordering NIV therapy because of its volume targeting AVAPS mode and faster responding AVAPS rate. The severity of this patient's disease is such that failure to provide noninvasive ventilator therapy on a daily basis will likely lead to future hospital re-admissions and life threatening situations.   - Oxygen by large bore nasal cannula.  - Continue airway clearance with nebulized normal saline and acapella.  She will need a prescription for normal saline nebulizer solution sent to her pharmacy at the time of discharge.  - Close monitoring for hemoptysis.   - Would benefit from VIR consultation later this week for elective bronchial artery embolization based on CT angio imaging from [facility] done March 2024 and imported into our system.  I reviewed the imaging with Dr. Adalberto Cole in chest radiology.  While her bronchial arteries are visible, he did not feel that they were enlarged.  She does have enlarged intercostal arteries though utility of embolization of these vessels is unclear.  -Continue nebulized therapy of arformoterol and budesonide.  She favors continuing these as an outpatient rather than inhalers.  -Consider trialing forehead oxygen probe when she is working with PT/OT.  Her peripheral oxygen saturations had been labile, even without any change in activity.    Severe pulmonary hypertension with RV failure:  - Diuresis.  Continue Lasix 80 mg IV 3 times daily with close monitoring of electrolytes.  - Daily standing weights.  Strict I's and O's.  Advised patient to record her intake and educated her that she does not need to force fluids if she is not thirsty.  - Trend BNP every 2 to 3 days.  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  - Continue azithromycin 250 mg daily for anti-inflammatory effect as long as her QTc is within safe limits.  - Continue home posaconazole    The recommendations outlined in this note were discussed w the primary team direct (in-person). Please do not hesitate to page [phone] [facility] consult) with questions. We appreciate the opportunity to assist in the care of this patient. We look forward to following with you.    Attestation      I  directly provided consultative services as documented in this note and I personally spent 65 minutes face-to-face and non face-to-face in the care of this patient, which includes all pre, intra and post visit time on the date of service including examining the patient, evaluating/interpreting objective clinical data, coordinating care with the entire multidisciplinary care team and developing a comprehensive management plan as outlined above. All documented time was specific to the E/M visit and does not include any procedures that may have been performed.      Viona Gilmore, MD      Subjective & Objective     Vitals - past 24 hours  Temp:  [36.1 ??C (97 ??F)-36.7 ??C (98 ??F)] 36.1 ??C (97 ??F)  Heart Rate:  [67-97] 78  SpO2 Pulse:  [78-99] 78  Resp:  [15-45] 24  BP: (91-120)/(54-71) 118/71  FiO2 (%):  [40 %] 40 %  SpO2:  [89 %-100 %] 100 % Intake/Output  I/O last 3 completed shifts:  In: 1843.9 [P.O.:1260; I.V.:8.9; IV Piggyback:575]  Out: 5700 [Urine:5700]      Pertinent exam findings:   General appearance - well appearing, well nourished, and in no distress  Eyes - EOMI, PERRLA, anicteric sclerae, pink conjunctiva  Mouth - moist mucous membranes, no pharyngeal erythema or exudates  Neck - Trachea midline, normal neck movement  Lymphatics - not examined  Heart - regular rate and rhythm. No murmur. Sharp PT but able to split heart sounds. No JVD appreciated.   Chest - equal expansion, coarse breath sounds but, no wheezes, rhonchi, or rales.  Good air entry.  Easy work of breathing without accessory muscle use.  No cough   Abdomen - soft, nontender, nondistended, no masses or organomegaly  Extremities - No lower extremity edema. Mild clubbing.  Unable to appreciate cyanosis due to artificial nails   Skin - normal coloration and turgor, no rashes, no suspicious skin lesions noted  Neurological - alert, oriented, normal speech, no focal findings or movement disorder noted    Relevant Imaging:  - CXR on 11/05/2022 revealed stable findings of pulmonary sarcoidosis including fibrosis and bullous disease with superimposed hazy/patchy consolidation in the left mid lung zone.  - CT chest with contrast on 10/21/22 revealed no pulmonary emboli or evidence of pulmonary hemorrhage.  Unchanged fibrotic pulmonary sarcoidosis with biapical upper lobe cystic bronchiectasis and scattered segmental and subsegmental fibrosis with bronchiectasis.  Unchanged bulky eggshell calcified mediastinal and hilar lymphadenopathy compatible with pulmonary sarcoidosis.  - ECHO on 10/23/2022 revealed normal EF of greater than 55% but severe pulmonary hypertension with severe dilation of the right ventricle and mildly reduced systolic function.     Arterial Blood Gas:   No results for input(s): SPECTYPEART, PHART, PCO2ART, PO2ART, HCO3ART, BEART, O2SATART in the last 24 hours.     Venous Blood Gas:   No results for input(s): PHVEN, PCO2VEN, PO2VEN, HCO3VEN, BEVEN, O2SATVEN in the last 24 hours.     Cultures:  Blood Culture, Routine (no units)   Date Value   11/05/2022 No Growth at 24 hours   11/05/2022 No Growth at 24 hours     Lower Respiratory Culture (no units)   Date Value   10/24/2022 OROPHARYNGEAL FLORA ISOLATED     WBC (10*9/L)   Date Value   11/07/2022 12.9 (H)          Other Labs:  Lab Results   Component Value Date    WBC 12.9 (H) 11/07/2022    HGB 8.7 (L) 11/07/2022  HCT 27.4 (L) 11/07/2022    PLT 205 11/07/2022     Lab Results   Component Value Date    NA 139 11/07/2022    K 4.2 11/07/2022    CL 100 11/07/2022    CO2 34.3 (H) 11/07/2022    BUN 28 (H) 11/07/2022    CREATININE 1.52 (H) 11/07/2022    GLU 245 (H) 11/07/2022    CALCIUM 9.3 11/07/2022    MG 1.7 11/07/2022    PHOS 2.7 11/05/2022     Lab Results   Component Value Date    BILITOT 1.1 11/05/2022    BILIDIR <0.10 05/30/2019    PROT 7.2 11/05/2022    ALBUMIN 3.3 (L) 11/05/2022    ALT <7 (L) 11/05/2022    AST 9 11/05/2022    ALKPHOS 84 11/05/2022    GGT 60 (H) 05/31/2011     Lab Results   Component Value Date    INR 1.02 12/11/2019    APTT 30.9 12/11/2019     Last FVL: FVC 1.89 L (48%), FEV1 1.31 L (43%), FEV1/FVC 69%.    Allergies & Home Medications   Personally reviewed in Epic    Continuous Infusions:       Scheduled Medications:    [Provider Hold] amlodipine  5 mg Oral Daily    arformoterol  15 mcg Nebulization BID (RT)    budesonide (PULMICORT) nebulizer solution  0.5 mg Nebulization BID (RT)    furosemide  80 mg Intravenous TID    gabapentin  200 mg Oral TID    insulin glargine  35 Units Subcutaneous Nightly    insulin lispro  0-20 Units Subcutaneous ACHS    insulin lispro  5 Units Subcutaneous TID AC    ipratropium-albuterol  3 mL Nebulization Q4H (RT)    piperacillin-tazobactam (ZOSYN) IV (intermittent)  3.375 g Intravenous Q6H    posaconazole  200 mg Oral Daily    sildenafiL (pulm.hypertension)  40 mg Oral TID    sodium chloride  3 mL Nebulization 4x Daily (RT)       PRN medications:  acetaminophen, albuterol, dextrose in water, glucagon, glucose, guaiFENesin, melatonin, cough/sore throat lozenge, ondansetron, polyethylene glycol, senna

## 2022-11-08 LAB — BASIC METABOLIC PANEL
ANION GAP: 4 mmol/L — ABNORMAL LOW (ref 5–14)
BLOOD UREA NITROGEN: 37 mg/dL — ABNORMAL HIGH (ref 9–23)
BUN / CREAT RATIO: 25
CALCIUM: 9.5 mg/dL (ref 8.7–10.4)
CHLORIDE: 101 mmol/L (ref 98–107)
CO2: 39.5 mmol/L — ABNORMAL HIGH (ref 20.0–31.0)
CREATININE: 1.49 mg/dL — ABNORMAL HIGH
EGFR CKD-EPI (2021) FEMALE: 40 mL/min/{1.73_m2} — ABNORMAL LOW (ref >=60)
GLUCOSE RANDOM: 75 mg/dL (ref 70–179)
POTASSIUM: 4 mmol/L (ref 3.5–5.1)
SODIUM: 144 mmol/L (ref 135–145)

## 2022-11-08 LAB — IRON PANEL
IRON SATURATION: 9 % — ABNORMAL LOW (ref 20–55)
IRON: 29 ug/dL — ABNORMAL LOW
TOTAL IRON BINDING CAPACITY: 308 ug/dL (ref 250–425)

## 2022-11-08 LAB — FOLATE: FOLATE: 6.2 ng/mL (ref >=5.4)

## 2022-11-08 LAB — B-TYPE NATRIURETIC PEPTIDE: B-TYPE NATRIURETIC PEPTIDE: 36.97 pg/mL (ref <=100)

## 2022-11-08 LAB — MAGNESIUM: MAGNESIUM: 1.7 mg/dL (ref 1.6–2.6)

## 2022-11-08 LAB — FERRITIN: FERRITIN: 81.6 ng/mL

## 2022-11-08 LAB — VITAMIN B12: VITAMIN B-12: 1907 pg/mL — ABNORMAL HIGH (ref 211–911)

## 2022-11-08 MED ADMIN — furosemide (LASIX) injection 80 mg: 80 mg | INTRAVENOUS | @ 16:00:00

## 2022-11-08 MED ADMIN — furosemide (LASIX) injection 80 mg: 80 mg | INTRAVENOUS | @ 10:00:00

## 2022-11-08 MED ADMIN — acetaminophen (TYLENOL) tablet 650 mg: 650 mg | ORAL | @ 19:00:00

## 2022-11-08 MED ADMIN — acetaminophen (TYLENOL) tablet 650 mg: 650 mg | ORAL | @ 01:00:00

## 2022-11-08 MED ADMIN — ipratropium-albuterol (DUO-NEB) 0.5-2.5 mg/3 mL nebulizer solution 3 mL: 3 mL | RESPIRATORY_TRACT

## 2022-11-08 MED ADMIN — gabapentin (NEURONTIN) capsule 200 mg: 200 mg | ORAL | @ 18:00:00

## 2022-11-08 MED ADMIN — piperacillin-tazobactam (ZOSYN) 3.375 g in sodium chloride 0.9 % (NS) 100 mL IVPB-MBP: 3.375 g | INTRAVENOUS | @ 01:00:00 | Stop: 2022-11-10

## 2022-11-08 MED ADMIN — ipratropium-albuterol (DUO-NEB) 0.5-2.5 mg/3 mL nebulizer solution 3 mL: 3 mL | RESPIRATORY_TRACT | @ 05:00:00

## 2022-11-08 MED ADMIN — gabapentin (NEURONTIN) capsule 200 mg: 200 mg | ORAL | @ 01:00:00

## 2022-11-08 MED ADMIN — ipratropium-albuterol (DUO-NEB) 0.5-2.5 mg/3 mL nebulizer solution 3 mL: 3 mL | RESPIRATORY_TRACT | @ 15:00:00

## 2022-11-08 MED ADMIN — piperacillin-tazobactam (ZOSYN) 3.375 g in sodium chloride 0.9 % (NS) 100 mL IVPB-MBP: 3.375 g | INTRAVENOUS | @ 07:00:00 | Stop: 2022-11-10

## 2022-11-08 MED ADMIN — sildenafiL (pulm.hypertension) (REVATIO) tablet 40 mg: 40 mg | ORAL | @ 01:00:00

## 2022-11-08 MED ADMIN — azithromycin (ZITHROMAX) tablet 250 mg: 250 mg | ORAL | @ 13:00:00 | Stop: 2022-11-15

## 2022-11-08 MED ADMIN — ipratropium-albuterol (DUO-NEB) 0.5-2.5 mg/3 mL nebulizer solution 3 mL: 3 mL | RESPIRATORY_TRACT | @ 09:00:00

## 2022-11-08 MED ADMIN — posaconazole (NOXAFIL) delayed released tablet 200 mg: 200 mg | ORAL | @ 13:00:00

## 2022-11-08 MED ADMIN — sildenafiL (pulm.hypertension) (REVATIO) tablet 40 mg: 40 mg | ORAL | @ 18:00:00

## 2022-11-08 MED ADMIN — piperacillin-tazobactam (ZOSYN) 3.375 g in sodium chloride 0.9 % (NS) 100 mL IVPB-MBP: 3.375 g | INTRAVENOUS | @ 12:00:00 | Stop: 2022-11-10

## 2022-11-08 MED ADMIN — piperacillin-tazobactam (ZOSYN) 3.375 g in sodium chloride 0.9 % (NS) 100 mL IVPB-MBP: 3.375 g | INTRAVENOUS | @ 18:00:00 | Stop: 2022-11-10

## 2022-11-08 MED ADMIN — ipratropium-albuterol (DUO-NEB) 0.5-2.5 mg/3 mL nebulizer solution 3 mL: 3 mL | RESPIRATORY_TRACT | @ 12:00:00

## 2022-11-08 MED ADMIN — sodium chloride 0.9 % nebulizer solution 3 mL: 3 mL | RESPIRATORY_TRACT | @ 16:00:00

## 2022-11-08 MED ADMIN — sodium chloride 0.9 % nebulizer solution 3 mL: 3 mL | RESPIRATORY_TRACT | @ 20:00:00

## 2022-11-08 MED ADMIN — sildenafiL (pulm.hypertension) (REVATIO) tablet 40 mg: 40 mg | ORAL | @ 13:00:00

## 2022-11-08 MED ADMIN — budesonide (PULMICORT) nebulizer solution 0.5 mg: .5 mg | RESPIRATORY_TRACT | @ 12:00:00

## 2022-11-08 MED ADMIN — insulin lispro (HumaLOG) injection 5 Units: 5 [IU] | SUBCUTANEOUS | @ 13:00:00 | Stop: 2022-11-08

## 2022-11-08 MED ADMIN — guaiFENesin (ROBITUSSIN) oral syrup: 200 mg | ORAL | @ 22:00:00

## 2022-11-08 MED ADMIN — insulin glargine (LANTUS) injection 35 Units: 35 [IU] | SUBCUTANEOUS | @ 01:00:00

## 2022-11-08 MED ADMIN — ipratropium-albuterol (DUO-NEB) 0.5-2.5 mg/3 mL nebulizer solution 3 mL: 3 mL | RESPIRATORY_TRACT | @ 20:00:00

## 2022-11-08 MED ADMIN — insulin lispro (HumaLOG) injection 0-20 Units: 0-20 [IU] | SUBCUTANEOUS | @ 21:00:00

## 2022-11-08 MED ADMIN — gabapentin (NEURONTIN) capsule 200 mg: 200 mg | ORAL | @ 13:00:00

## 2022-11-08 MED ADMIN — insulin lispro (HumaLOG) injection 0-20 Units: 0-20 [IU] | SUBCUTANEOUS | @ 01:00:00

## 2022-11-08 MED ADMIN — arformoterol (BROVANA) nebulizer solution 15 mcg/2 mL: 15 ug | RESPIRATORY_TRACT | @ 12:00:00

## 2022-11-08 NOTE — Unmapped (Signed)
Problem: Adult Inpatient Plan of Care  Goal: Plan of Care Review  Outcome: Progressing  Flowsheets (Taken 11/08/2022 1631)  Progress: improving  Outcome Evaluation: continued IV diuresis with lasix with brisk UO throughout day  Plan of Care Reviewed With: patient  Goal: Patient-Specific Goal (Individualized)  Outcome: Progressing  Goal: Absence of Hospital-Acquired Illness or Injury  Outcome: Progressing  Intervention: Identify and Manage Fall Risk  Recent Flowsheet Documentation  Taken 11/08/2022 0800 by Scherrie Gerlach, RN  Safety Interventions:   fall reduction program maintained   lighting adjusted for tasks/safety   low bed   nonskid shoes/slippers when out of bed   toileting scheduled  Intervention: Prevent Skin Injury  Recent Flowsheet Documentation  Taken 11/08/2022 1600 by Scherrie Gerlach, RN  Positioning for Skin: Right  Taken 11/08/2022 1400 by Scherrie Gerlach, RN  Positioning for Skin: Left  Taken 11/08/2022 1200 by Scherrie Gerlach, RN  Positioning for Skin: Right  Taken 11/08/2022 1000 by Scherrie Gerlach, RN  Positioning for Skin: Left  Taken 11/08/2022 0800 by Scherrie Gerlach, RN  Positioning for Skin: Right  Device Skin Pressure Protection:   absorbent pad utilized/changed   adhesive use limited   positioning supports utilized   pressure points protected   tubing/devices free from skin contact  Skin Protection:   adhesive use limited   cleansing with dimethicone incontinence wipes   incontinence pads utilized   protective footwear used   pulse oximeter probe site changed   tubing/devices free from skin contact  Intervention: Prevent and Manage VTE (Venous Thromboembolism) Risk  Recent Flowsheet Documentation  Taken 11/08/2022 1600 by Scherrie Gerlach, RN  Anti-Embolism Device Type: SCD, Knee  Anti-Embolism Intervention: On  Anti-Embolism Device Location: BLE  Taken 11/08/2022 1400 by Scherrie Gerlach, RN  Anti-Embolism Device Type: SCD, Knee  Anti-Embolism Intervention: On  Anti-Embolism Device Location: BLE  Taken 11/08/2022 1200 by Scherrie Gerlach, RN  Anti-Embolism Device Type: SCD, Knee  Anti-Embolism Intervention: On  Anti-Embolism Device Location: BLE  Taken 11/08/2022 1000 by Scherrie Gerlach, RN  Anti-Embolism Device Type: SCD, Knee  Anti-Embolism Intervention: On  Anti-Embolism Device Location: BLE  Taken 11/08/2022 0800 by Scherrie Gerlach, RN  Anti-Embolism Device Type: SCD, Knee  Anti-Embolism Intervention: On  Anti-Embolism Device Location: BLE  Intervention: Prevent Infection  Recent Flowsheet Documentation  Taken 11/08/2022 0800 by Scherrie Gerlach, RN  Infection Prevention: hand hygiene promoted  Goal: Optimal Comfort and Wellbeing  Outcome: Progressing  Goal: Readiness for Transition of Care  Outcome: Progressing  Goal: Rounds/Family Conference  Outcome: Progressing     Problem: Infection  Goal: Absence of Infection Signs and Symptoms  Outcome: Progressing  Intervention: Prevent or Manage Infection  Recent Flowsheet Documentation  Taken 11/08/2022 0800 by Scherrie Gerlach, RN  Infection Management: aseptic technique maintained     Problem: Self-Care Deficit  Goal: Improved Ability to Complete Activities of Daily Living  Outcome: Progressing     Problem: Fall Injury Risk  Goal: Absence of Fall and Fall-Related Injury  Outcome: Progressing  Intervention: Promote Injury-Free Environment  Recent Flowsheet Documentation  Taken 11/08/2022 0800 by Scherrie Gerlach, RN  Safety Interventions:   fall reduction program maintained   lighting adjusted for tasks/safety   low bed   nonskid shoes/slippers when out of bed   toileting scheduled  Goal: Blood Glucose Levels Within Targeted Range  Outcome: Progressing  Intervention: Monitor and Manage Glycemia  Recent Flowsheet Documentation  Taken 11/08/2022 1200 by Scherrie Gerlach, RN  Glycemic Management:   blood glucose monitored  carbohydrate replacement provided  Taken 11/08/2022 0800 by Scherrie Gerlach, RN  Glycemic Management:   blood glucose monitored supplemental insulin given  Goal: Blood Pressure in Desired Range  Outcome: Progressing

## 2022-11-08 NOTE — Unmapped (Cosign Needed)
[facility]  DIVISION OF INTERVENTIONAL RADIOLOGY CONSULTATION       IR Consultation Note     Requesting Attending Physician: Erlinda Hong, MD  Service Requesting Consult: Med Undesignated (MDX)    Date of Service: 11/08/2022  Consulting Interventional Radiologist: Dr. Georgeann Oppenheim       HPI:  57 y.o. 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     Patient seen in consultation at the request of inpatient team for evaluation for evaluation for outpatient BAE for history of recurrent hemoptysis.    She has had recurrent hemoptysis likely due to chronic bronchiectasis and aspergillosis with periodic flares requiring hospitalization. She is currently admitting for an increase from 6L by Anthony to 8-10L and increased shortness of breath. Currently, she is having small volume hemoptysis which is similar to baseline. Hemoglobin upon admission is similar to baseline. INR/platelets wnl. HDS.     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 A.niger from sputum 08/2016-09/2016.    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    Sarcoidosis 1995    Type 2 diabetes mellitus with hyperglycemia (CMS-HCC) 07/27/2014    Visual impairment glasses       PAST SURGICAL HISTORY:  Past Surgical History:   Procedure Laterality Date    LUNG LOBECTOMY      PR AMPUTATION TOE,MT-P JT Right 12/12/2019    Procedure: Right foot fifth toe amputation at the metatarsophalangeal joint level;  Surgeon: Britt Bottom, DPM;  Location: MAIN OR [facility];  Service: Vascular    PR AMPUTATION TOE,MT-P JT Right 06/16/2020    Procedure: Right foot amputation of 3rd toe at mpj level;  Surgeon: Britt Bottom, DPM;  Location: MAIN OR [facility];  Service: Vascular    PR RIGHT HEART CATH O2 SATURATION & CARDIAC OUTPUT N/A 04/29/2019    Procedure: Right Heart Catheterization;  Surgeon: Rosana Hoes, MD;  Location: [facility] CATH;  Service: Cardiology    PR THORACOSCOPY SURG TOT PULM DECORT Left 12/14/2014    Procedure: THORACOSCOPY SURG; W/TOT PULM DECORTIC/PNEUMOLYS;  Surgeon: Evert Kohl, MD;  Location: MAIN OR [facility];  Service: Cardiothoracic    PR THORACOSCOPY W/THERA WEDGE RESEXN INITIAL UNILAT Left 12/03/2014    Procedure: THORACOSCOPY, SURGICAL; WITH THERAPEUTIC WEDGE RESECTION (EG, MASS, NODULE) INITIAL UNILATERAL;  Surgeon: Evert Kohl, MD;  Location: MAIN OR [facility];  Service: Cardiothoracic       SOCIAL HISTORY:  Social History     Socioeconomic History    Marital status: Single   Tobacco Use    Smoking status: Former  Current packs/day: 0.00     Average packs/day: 0.5 packs/day for 9.0 years (4.5 ttl pk-yrs)     Types: Cigarettes     Start date: 05/02/1978     Quit date: 05/03/1987     Years since quitting: 35.5    Smokeless tobacco: Never   Vaping Use    Vaping status: Never Used   Substance and Sexual Activity    Alcohol use: No     Alcohol/week: 0.0 standard drinks of alcohol    Drug use: No    Sexual activity: Yes   Other Topics Concern    Do you use sunscreen? No    Tanning bed use? No    Are you easily burned? No    Excessive sun exposure? No    Blistering sunburns? No     Social Determinants of Health     Financial Resource Strain: Low Risk (11/06/2022)    Overall Financial Resource Strain (CARDIA)     Difficulty of Paying Living Expenses: Not hard at all   Food Insecurity: No Food Insecurity (11/06/2022)    Hunger Vital Sign     Worried About Running Out of Food in the Last Year: Never true     Ran Out of Food in the Last Year: Never true   Transportation Needs: No Transportation Needs (11/06/2022)    PRAPARE - Therapist, art (Medical): No     Lack of Transportation (Non-Medical): No       MEDICATIONS:     Current Facility-Administered Medications:     acetaminophen (TYLENOL) tablet 650 mg, 650 mg, Oral, Q4H PRN, Lillia Carmel, MD, 650 mg at 11/07/22 2036    albuterol 2.5 mg /3 mL (0.083 %) nebulizer solution 2.5 mg, 2.5 mg, Nebulization, Q4H PRN, Rocco Serene, MD    [Provider Hold] amlodipine (NORVASC) tablet 5 mg, 5 mg, Oral, Daily, Desma Maxim, Netty Starring, MD    arformoterol [facility]) nebulizer solution 15 mcg/2 mL, 15 mcg, Nebulization, BID (RT), Lillia Carmel, MD, 15 mcg at 11/08/22 0826    azithromycin (ZITHROMAX) tablet 250 mg, 250 mg, Oral, Q24H SCH, Lillia Carmel, MD, 250 mg at 11/08/22 0850    budesonide (PULMICORT) nebulizer solution 0.5 mg, 0.5 mg, Nebulization, BID (RT), Lillia Carmel, MD, 0.5 mg at 11/08/22 0825    dextrose (D10W) 10% bolus 125 mL, 12.5 g, Intravenous, Q10 Min PRN, Lillia Carmel, MD    furosemide (LASIX) injection 80 mg, 80 mg, Intravenous, TID, Lillia Carmel, MD, 80 mg at 11/08/22 0865    gabapentin (NEURONTIN) capsule 200 mg, 200 mg, Oral, TID, Lillia Carmel, MD, 200 mg at 11/08/22 0850    glucagon injection 1 mg, 1 mg, Intramuscular, Once PRN, Lillia Carmel, MD    glucose chewable tablet 16 g, 16 g, Oral, Q10 Min PRN, Lillia Carmel, MD    guaiFENesin (ROBITUSSIN) oral syrup, 200 mg, Oral, Q4H PRN, Nathaneil Canary, Amy E, MD, 200 mg at 11/07/22 0305    insulin glargine (LANTUS) injection 35 Units, 35 Units, Subcutaneous, Nightly, Rocco Serene, MD, 35 Units at 11/07/22 2033    insulin lispro (HumaLOG) injection 0-20 Units, 0-20 Units, Subcutaneous, ACHS, Lillia Carmel, MD, 3 Units at 11/07/22 2032    insulin lispro (HumaLOG) injection 5 Units, 5 Units, Subcutaneous, TID AC, Rocco Serene, MD, 5 Units at 11/08/22 0857    ipratropium-albuterol (DUO-NEB) 0.5-2.5 mg/3 mL nebulizer solution 3 mL, 3 mL, Nebulization, Q4H (RT), Desma Maxim,  Netty Starring, MD, 3 mL at 11/08/22 0818    melatonin tablet 3 mg, 3 mg, Oral, Nightly PRN, Lillia Carmel, MD    menthol (COUGH DROPS) lozenge 1 lozenge, 1 lozenge, Oral, Q2H PRN, Nathaneil Canary, Amy E, MD, 1 lozenge at 11/07/22 1220    ondansetron (ZOFRAN) injection 4 mg, 4 mg, Intravenous, Q6H PRN, Lillia Carmel, MD, 4 mg at 11/05/22 1653    piperacillin-tazobactam (ZOSYN) 3.375 g in sodium chloride 0.9 % (NS) 100 mL IVPB-MBP, 3.375 g, Intravenous, Q6H, Lillia Carmel, MD, Stopped at 11/08/22 0845    polyethylene glycol (MIRALAX) packet 17 g, 17 g, Oral, Daily PRN, Lillia Carmel, MD    posaconazole (NOXAFIL) delayed released tablet 200 mg, 200 mg, Oral, Daily, Lillia Carmel, MD, 200 mg at 11/08/22 0850    senna (SENOKOT) tablet 2 tablet, 2 tablet, Oral, Nightly PRN, Lillia Carmel, MD    sildenafiL (pulm.hypertension) (REVATIO) tablet 40 mg, 40 mg, Oral, TID, Lillia Carmel, MD, 40 mg at 11/08/22 0850    sodium chloride 0.9 % nebulizer solution 3 mL, 3 mL, Nebulization, 4x Daily (RT), Lillia Carmel, MD, 3 mL at 11/07/22 1553    ALLERGIES:  No Known Allergies    REVIEW OF SYSTEMS:  Review of Systems - as per HPI      PHYSICAL EXAM:  Vitals:    11/08/22 0800   BP: 94/68   Pulse: 77   Resp: 23   Temp: 36.4 ??C (97.5 ??F)   SpO2: 96%     ASA Grade: ASA 3 - Patient with moderate systemic disease with functional limitations     Pertinent Labs:     WBC   Date Value Ref Range Status   11/07/2022 12.9 (H) 3.6 - 11.2 10*9/L Final   07/03/2014 8.3 4.5 - 11.0 10*9/L Final     HGB   Date Value Ref Range Status 11/07/2022 8.7 (L) 11.3 - 14.9 g/dL Final     Hemoglobin   Date Value Ref Range Status   02/13/2018 10.8 (L) 12.0 - 16.0 g/dL Final     Comment:     Point of Care Testing performed at the point of care by trained personnel per documented policies.     Hemoglobin, POC   Date Value Ref Range Status   04/29/2019 11.5 (L) 12.0 - 16.0 g/dL Final     HCT   Date Value Ref Range Status   11/07/2022 27.4 (L) 34.0 - 44.0 % Final   07/03/2014 27.7 (L) 36.0 - 46.0 % Final     Platelet   Date Value Ref Range Status   11/07/2022 205 150 - 450 10*9/L Final   07/03/2014 343 150 - 440 10*9/L Final     INR   Date Value Ref Range Status   12/11/2019 1.02  Final   12/11/2014 1.0  Final     Creatinine   Date Value Ref Range Status   11/08/2022 1.49 (H) 0.55 - 1.02 mg/dL Final   23/76/2831 5.17 0.60 - 1.00 mg/dL Final          ASSESSMENT & PLAN:    57 y.o.female with a history of 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 It is reassuring that the patient is not having acute on chronic hemoptysis with a stable hemodynamic status and hemoglobin. This would likely be an elective procedure with a formal evaluation  in the outpatient setting via clinic visit. This was communicated to the primary team and they agree.    VIR recommends proceeding with formal evaluation for BAE given chronic hemoptysis. We will schedule a clinic visit for once the patient is discharged from the hospital.    The patient was discussed with Dr. Georgeann Oppenheim .     Thank you for involving [place] in the care of this patient. Please page the VIR consult pager [phone]) with further questions, concerns, or if new issues arise.    Marisa Severin, MD, November 08, 2022, 7:59 AM

## 2022-11-08 NOTE — Unmapped (Signed)
General Pulmonary Team Follow Up Consult Note     Date of Service: 11/08/2022  Requesting Physician: Erlinda Hong, MD   Requesting Service: Med Undesignated (MDX)  Reason for consultation: Comprehensive evaluation of acute on chronic hypoxemic and hypercarbic respiratory failure secondary to likely pneumonia superimposed on pulmonary sarcoidosis with bronchiectasis and aspergilloma, severe persistent asthma, pulmonary arterial hypertension with cor pulmonale     Hospital Problems:  Active Problems:    Pulmonary sarcoidosis (CMS-HCC)    HTN (hypertension)    Type 2 diabetes mellitus with hyperglycemia (CMS-HCC)    Acute on chronic hypoxic respiratory failure (CMS-HCC)    Chronic pulmonary aspergillosis (CMS-HCC)    Severe persistent asthma dependent on systemic steroids    Pulmonary hypertension (CMS-HCC)      HPI: Karen Barber is a 57 y.o. female with chronic hypoxemic and hypercarbic respiratory failure secondary to pulmonary sarcoidosis with bronchiectasis and aspergilloma, severe persistent asthma, pulmonary arterial hypertension with cor pulmonale.  She was recently hospitalized for acute on chronic hypoxemic and hypercarbic respiratory failure in the setting of hemoptysis.  She was diuresed and treated with antibiotics.  She saw Dr. Audrea Muscat in the pulmonary clinic 5 days ago, reporting that she felt to be at 90% of her baseline.     She presented today due to at least 48 hours of worsening shortness of breath and increased oxygen requirement.  She was using 6 L of oxygen at baseline but was needing to increase it to 8-10L in order to maintain her oxygen saturations.  Otherwise, she was desaturating to the 70s and 80s.  Over the past couple of days, she has been coughing up brown sputum with streaks, flecks, and small amounts of blood, not increased from prior.  She has found it difficult to cough up sputum in the absence of her airway clearance therapies.  She also noticed increased swelling of her lower extremities despite continuing to have a good diuretic response.  She typically sleeps propped up and had to increase her pillows further.  No nausea until she received IV fluid bolus in the ER.  She had subjective fevers at home but did not feel well enough to go and check her temperature.  Endorses soreness from coughing over her chest, left greater than right.  No GERD, dysphagia, regurgitation, or aspiration.     In the emergency room, she was given a 1 L fluid bolus over 15 minutes per sepsis protocol.  She was also given azithromycin, vancomycin and Zosyn.  Given Solu-Medrol 125 mg.  Upon arrival to the CCU, she required 11 to 12 L of supplemental oxygen after being moved to the bed.     Her prior lower respiratory cultures have primarily yielded oropharyngeal flora and more distantly Aspergillus [place].  Isolated Enterococcus faecium and Achromobacter species in July 2018.  Culture from August 2016 also had Achromobacter.  Isolated stenotrophomonas maltophilia from lung tissue in September 2016.  Her AFB cultures have also been negative with the exception of Mycobacterium fortuitum which is not typically pathogenic but can be seen in the setting of recurrent aspiration.  Has never isolated MRSA including from skin/soft tissue.    Problems addressed during this consult include acute on chronic respiratory failure, eosinophilic asthma, PAH with cor pulmonale, chronic bronchiectasis, sarcoidosis, recurrent hemoptysis. Based on these problems, the patient has high risk of morbidity/mortality which is commensurate w their risk of management options described below in the recommendations.    Assessment      Interval  Events:  Had trouble wearing AVAPS overnight due to coughing.  Reported to RT that saline is irritating her throat. Her chest feels less congested.  Sputum that she is coughing up is lighter brown in color as compared to admission.  No hemoptysis.  Had some difficulty tolerating the AVAPS last night due to cough.  Endorses marked improvement in her edema even as compared to yesterday.  Throat no longer bothering her.        Impression: The patient has made significant clinical progress since last encounter. I personally reviewed most recent pertinent labs, imaging and micro data, from 11/08/2022, which are noted in recommendations.  Suspect that throat irritation is more likely due to reflux rather than the normal saline which is isotonic.      Recommendations     Acute on chronic hypoxic and hypercapnic respiratory failure secondary to pneumonia superimposed on bronchiectasis and fibrosis 2/2 sarcoidosis; cor pulmonale.  History of hemoptysis  - Noninvasive mechanical ventilation.  She qualifies for AVAPS based on her restrictive lung disease secondary to sarcoidosis related bronchiectasis and fibrosis with an FVC less than 50% predicted.  I signed the prescription form for the AVAPS.  It will be delivered to the room tomorrow for her to start using while still in the hospital.  Non-invasive ventilation attestation:  This patient has severe chronic respiratory failure and restrictive lung disease (2/2 bronchiectasis and fibrosis from sarcoidosis). BiPAP has been considered and ruled out as it lacks the ability to adequately manage this patient's respiratory disease as it does not offer a volume targeted therapy. I am ordering NIV therapy because of its volume targeting AVAPS mode and faster responding AVAPS rate. The severity of this patient's disease is such that failure to provide noninvasive ventilator therapy on a daily basis will likely lead to future hospital re-admissions and life threatening situations.   - Oxygen by large bore nasal cannula.  - Continue airway clearance with nebulized normal saline and acapella.  She will need a prescription for normal saline nebulizer solution sent to her pharmacy at the time of discharge.  - Close monitoring for hemoptysis.   -Outpatient VIR consultation for potential elective bronchial artery embolization based on CT angio imaging from [facility] done March 2024 and imported into our system.  I reviewed the imaging with Dr. Adalberto Cole in chest radiology.  While her bronchial arteries are visible, he did not feel that they were enlarged.  She does have enlarged intercostal arteries though utility of embolization of these vessels is unclear.  It may also be difficult to navigate to these vessels due to the mediastinal lymphadenopathy.  -Continue nebulized therapy of arformoterol and budesonide.  She favors continuing these as an outpatient rather than inhalers.  -Consider trialing forehead oxygen probe when she is working with PT/OT.  Her peripheral oxygen saturations had been labile, even without any change in activity.  -Outpatient double contrast barium swallow to evaluate esophagus given appearance on CT, location of pneumonia, and sore throat.    Severe pulmonary hypertension with RV failure:  - Diuresis.  Reasonable to convert Lasix 80 mg IV to oral Lasix though at increased dose as compared to her home dose.  Would like to monitor volume status/diuretic response before discharge.  - Daily standing weights.  Strict I's and O's.  Advised patient to record her intake; she does not need to force fluids if she is not thirsty.  - Trend BNP every 2 to 3 days.  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  Unfortunately, sputum obtained on Sunday was not labeled for the lower respiratory culture and after it was used by the AFB bench, the sample was discarded.  Ordered new sputum culture.  If she is still colonized with Pseudomonas and this is isolated, will need to continue IV antibiotics.  I am hopeful that we will be able to convert her to Augmentin to complete a 2-week course.  - Continue azithromycin 250 mg daily for anti-inflammatory effect as long as her QTc is within safe limits.  - Continue home posaconazole    The recommendations outlined in this note were discussed w the primary team direct (in-person). Please do not hesitate to page [phone] [facility] consult) with questions. We appreciate the opportunity to assist in the care of this patient. We look forward to following with you.    Attestation      I directly provided consultative services as documented in this note and I personally spent 60 minutes face-to-face and non face-to-face in the care of this patient, which includes all pre, intra and post visit time on the date of service including examining the patient, evaluating/interpreting objective clinical data, coordinating care with the entire multidisciplinary care team and developing a comprehensive management plan as outlined above. All documented time was specific to the E/M visit and does not include any procedures that may have been performed.      Viona Gilmore, MD      Subjective & Objective     Vitals - past 24 hours  Temp:  [36.1 ??C (97 ??F)-36.9 ??C (98.4 ??F)] 36.4 ??C (97.5 ??F)  Heart Rate:  [68-96] 77  SpO2 Pulse:  [59-91] 68  Resp:  [20-34] 23  BP: (94-117)/(64-74) 94/68  FiO2 (%):  [40 %] 40 %  SpO2:  [92 %-100 %] 96 % Intake/Output  I/O last 3 completed shifts:  In: 1520 [P.O.:1320; IV Piggyback:200]  Out: 4850 [Urine:4850]      Pertinent exam findings:   General appearance - well appearing, well nourished, and in no distress  Eyes - EOMI, PERRLA, anicteric sclerae, pink conjunctiva  Mouth - moist mucous membranes, no pharyngeal erythema or exudates  Neck - Trachea midline, normal neck movement  Lymphatics - not examined  Heart - regular rate and rhythm. No murmur. Sharp PT but able to split heart sounds. No JVD appreciated.   Chest - equal expansion, coarse breath sounds with occasional crackles indicating improved air entry.  No wheezing or rhonchi.  Easy work of breathing without accessory muscle use.  No cough   Abdomen - soft, nontender, nondistended, no masses or organomegaly  Extremities - No lower extremity edema. Mild clubbing.  Unable to appreciate cyanosis due to artificial nails   Skin - normal coloration and turgor, no rashes, no suspicious skin lesions noted  Neurological - alert, oriented, normal speech, no focal findings or movement disorder noted    Relevant Imaging:  - CXR on 11/05/2022 revealed stable findings of pulmonary sarcoidosis including fibrosis and bullous disease with superimposed hazy/patchy consolidation in the left mid lung zone.  - CT chest with contrast on 10/21/22 revealed no pulmonary emboli or evidence of pulmonary hemorrhage.  Unchanged fibrotic pulmonary sarcoidosis with biapical upper lobe cystic bronchiectasis and scattered segmental and subsegmental fibrosis with bronchiectasis.  Unchanged bulky eggshell calcified mediastinal and hilar lymphadenopathy compatible with pulmonary sarcoidosis.  - ECHO on 10/23/2022 revealed normal EF of greater than  55% but severe pulmonary hypertension with severe dilation of the right ventricle and mildly reduced systolic function.     Arterial Blood Gas:   No results for input(s): SPECTYPEART, PHART, PCO2ART, PO2ART, HCO3ART, BEART, O2SATART in the last 24 hours.     Venous Blood Gas:   No results for input(s): PHVEN, PCO2VEN, PO2VEN, HCO3VEN, BEVEN, O2SATVEN in the last 24 hours.     Cultures:  Blood Culture, Routine (no units)   Date Value   11/05/2022 No Growth at 48 hours   11/05/2022 No Growth at 48 hours     Lower Respiratory Culture (no units)   Date Value   10/24/2022 OROPHARYNGEAL FLORA ISOLATED     WBC (10*9/L)   Date Value   11/07/2022 12.9 (H)          Other Labs:  Lab Results   Component Value Date    WBC 12.9 (H) 11/07/2022    HGB 8.7 (L) 11/07/2022    HCT 27.4 (L) 11/07/2022    PLT 205 11/07/2022     Lab Results   Component Value Date    NA 144 11/08/2022    K 4.0 11/08/2022    CL 101 11/08/2022    CO2 39.5 (H) 11/08/2022    BUN 37 (H) 11/08/2022    CREATININE 1.49 (H) 11/08/2022    GLU 75 11/08/2022    CALCIUM 9.5 11/08/2022    MG 1.7 11/08/2022    PHOS 2.7 11/05/2022     Lab Results   Component Value Date    BILITOT 1.1 11/05/2022    BILIDIR <0.10 05/30/2019    PROT 7.2 11/05/2022    ALBUMIN 3.3 (L) 11/05/2022    ALT <7 (L) 11/05/2022    AST 9 11/05/2022    ALKPHOS 84 11/05/2022    GGT 60 (H) 05/31/2011     Lab Results   Component Value Date    INR 1.02 12/11/2019    APTT 30.9 12/11/2019     Last FVL: FVC 1.89 L (48%), FEV1 1.31 L (43%), FEV1/FVC 69%.    Allergies & Home Medications   Personally reviewed in Epic    Continuous Infusions:       Scheduled Medications:    [Provider Hold] amlodipine  5 mg Oral Daily    arformoterol  15 mcg Nebulization BID (RT)    azithromycin  250 mg Oral Q24H SCH    budesonide (PULMICORT) nebulizer solution  0.5 mg Nebulization BID (RT)    furosemide  80 mg Intravenous TID    gabapentin  200 mg Oral TID    insulin glargine  35 Units Subcutaneous Nightly    insulin lispro  0-20 Units Subcutaneous ACHS    insulin lispro  5 Units Subcutaneous TID AC    ipratropium-albuterol  3 mL Nebulization Q4H (RT)    piperacillin-tazobactam (ZOSYN) IV (intermittent)  3.375 g Intravenous Q6H    posaconazole  200 mg Oral Daily    sildenafiL (pulm.hypertension)  40 mg Oral TID    sodium chloride  3 mL Nebulization 4x Daily (RT)       PRN medications:  acetaminophen, albuterol, dextrose in water, glucagon, glucose, guaiFENesin, melatonin, cough/sore throat lozenge, ondansetron, polyethylene glycol, senna

## 2022-11-08 NOTE — Unmapped (Signed)
Pt alert and oriented. Pt had brief O2 desaturations with exertion, similar to previous shifts. Otherwise, VSS. Pt had persistent coughing throughout shift. PRN tylenol given for sore throat. Pt had high urine output during shift. SSI given per sliding scale along with ordered lantus. Plan of care to continue.   Problem: Adult Inpatient Plan of Care  Goal: Plan of Care Review  Outcome: Progressing  Goal: Patient-Specific Goal (Individualized)  Outcome: Progressing  Goal: Absence of Hospital-Acquired Illness or Injury  Outcome: Progressing  Intervention: Identify and Manage Fall Risk  Recent Flowsheet Documentation  Taken 11/07/2022 2000 by Haze Rushing, RN  Safety Interventions:   bed alarm   fall reduction program maintained   low bed   lighting adjusted for tasks/safety   aspiration precautions   bleeding precautions   infection management   nonskid shoes/slippers when out of bed  Intervention: Prevent Skin Injury  Recent Flowsheet Documentation  Taken 11/07/2022 2000 by Haze Rushing, RN  Positioning for Skin: Left  Device Skin Pressure Protection:   absorbent pad utilized/changed   adhesive use limited   positioning supports utilized   skin-to-device areas padded  Skin Protection:   adhesive use limited   cleansing with dimethicone incontinence wipes   incontinence pads utilized   skin-to-device areas padded   transparent dressing maintained   tubing/devices free from skin contact  Intervention: Prevent Infection  Recent Flowsheet Documentation  Taken 11/07/2022 2000 by Haze Rushing, RN  Infection Prevention:   cohorting utilized   hand hygiene promoted   rest/sleep promoted   single patient room provided   equipment surfaces disinfected  Goal: Optimal Comfort and Wellbeing  Outcome: Progressing  Goal: Readiness for Transition of Care  Outcome: Progressing  Goal: Rounds/Family Conference  Outcome: Progressing     Problem: Infection  Goal: Absence of Infection Signs and Symptoms  Outcome: Progressing  Intervention: Prevent or Manage Infection  Recent Flowsheet Documentation  Taken 11/07/2022 2000 by Haze Rushing, RN  Infection Management: aseptic technique maintained     Problem: Self-Care Deficit  Goal: Improved Ability to Complete Activities of Daily Living  Outcome: Progressing     Problem: Fall Injury Risk  Goal: Absence of Fall and Fall-Related Injury  Outcome: Progressing  Intervention: Promote Injury-Free Environment  Recent Flowsheet Documentation  Taken 11/07/2022 2000 by Haze Rushing, RN  Safety Interventions:   bed alarm   fall reduction program maintained   low bed   lighting adjusted for tasks/safety   aspiration precautions   bleeding precautions   infection management   nonskid shoes/slippers when out of bed     Problem: Comorbidity Management  Goal: Maintenance of Asthma Control  Outcome: Progressing  Goal: Maintenance of COPD Symptom Control  Outcome: Progressing  Goal: Blood Glucose Levels Within Targeted Range  Outcome: Progressing  Goal: Blood Pressure in Desired Range  Outcome: Progressing

## 2022-11-08 NOTE — Unmapped (Signed)
Pt on 5-6L [place] this shift.  Only wore AVAPS for a few hours overnight - wanted to take off due to coughing fits.  All breathing txs given.  Acapella done for airway clearance.  Coughing up brown sputum, denies any current hemoptysis.  Does endorse throat irritation from saline tx so it was held overnight.      Problem: Comorbidity Management  Goal: Maintenance of COPD Symptom Control  Outcome: Ongoing - Unchanged

## 2022-11-08 NOTE — Unmapped (Signed)
Pt tolerated all scheduled breathing treatment with no adverse events. Coughing up moderate amounts of sputum per pt.

## 2022-11-08 NOTE — Unmapped (Signed)
Daily Progress Note    Assessment/Plan:   Active Problems:    Pulmonary sarcoidosis (CMS-HCC)    HTN (hypertension)    Type 2 diabetes mellitus with hyperglycemia (CMS-HCC)    Acute on chronic hypoxic respiratory failure (CMS-HCC)    Chronic pulmonary aspergillosis (CMS-HCC)    Severe persistent asthma dependent on systemic steroids    Pulmonary hypertension (CMS-HCC)       LOS: 3 days     Karen Barber is a 57 y.o. female who is presenting to [facility] with acute on chronic hypoxemic respiratory failure in the setting of potential infection complicated by history of bronchiectasis, reactive airway disease, and aspergillosis on posaconazole. Patient's hospital course has been complicated by fluid overload in the setting of cor pulmonale.     Acute on chronic hypoxemic respiratory failure  Acute hypercapnic respiratory failure  Potential infection from bronchiectasis and RAD exacerbation  Patient continues to be afebrile. Currently hemodynamically stable. Patient is currently on AVAPS overnight and 6L [place] during the day, both of which she is comfortable with. AVAPS machine should arrive 8/22. Dypsnea and sputum is continuing to improve as the patient now endorses seeing more clear sputum than dark brown/burgundy sputum. Blood culture showing no growth at 48 hours. Negative RPP and MRSA PCR and screen. AFB smear and culture negative. Sputum culture - resent 8/21 due to issue with lab processing. Will adjust antibiotics pending the culture results.     Plan:  Pulmonology following, recommendations appreciated:  - Oxygen support with AVAPS at night and [place] during the day, wean as tolerated.   - Airway clearance with nebulized normal saline and acapella.  - RAD nebulizer therapy with Pulmicort, Brovana, and DuoNebs  - Continue Zosyn and azithromycin. Adjust as needed pending culture results  - s/p 1x Rocephin (8/18), Vancomycin (8/18-8/19)  - VIR consulted, outpatient evaluation for BAE scheduled 10/25 with Dr. Duanne Limerick    Cor Pulmonale  Pulmonary hypertension  RV Failure  Maintaining good UOP and did well on limiting fluid intake unless needed. Likely now euvolemic as edema is improved, BNP normalized, now with elevated BUN and contraction alkalosis. Will hold off on further IV diuresis.  - Transition to lasix 80mg  PO BID (home dose was 40mg  BID but was not having good response)  - Daily AM BMP for K/creat monitoring  - Trend BNP every 2 to 3 days (next: 8/24)  - Daily standing weights  - Strict I&Os  - Sildenafil 40 mg TID for pulmonary hypertension    Type 2 diabetes mellitus with hyperglycemia   HgbA1c 8.3% July 2024, Home regimen of glargine 18 units nightly, sliding scale insulin. Bumped up to glargine 35 units and additional meal time lispro 5 units due to hyperglycemia from 1x of solu-medrol in ED. Concern for overcorrection so will go back to home regimen. Will continue to monitor.   - Glargine 18 units at night.   - Discontinue meal-time lispro 5 units   - Additional sliding scale insulin ACHS    CKD stage III   GFR currently likely approx 30 based on cystatin C  Baseline creatinine appears to be around 1.2-1.3. Creatinine stable and hovering around 1.5  - Renally dose medications, avoid nephrotoxic agents  - Continue to monitor with BMP  - Going back to home Lasix dose    Anemia - mixed  Likely with iron deficiency anemia (iron sat <10%) in addition to some combination of anemia of CKD and anemia of chronic disease.   - Plan  to treat with supplemental iron, likely parenteral this admission    Chronic pulmonary aspergillosis  AFB smear negative, AFB culture pending  - Continue home posaconazole 200 mg daily  - EKG on admission obtained with normal Qtc of 411 ms    HTN  - Hold home amlodipine due to low pressure episode  - S/p norepinephrine 11/06/2022    PPX. Hold Lovenox/chemical DVT prophylaxis in setting of possible mopped assist, SCDs only, ambulation as tolerated     Diet - Consistent carb diet. Avoid sugary drinks     Code Status / HCDM  - DNI/DNR, Discussed with patient at the time of rounds 11/06/2022  -  HCDM (patient stated preference): [name] - Domestic Partner - [phone]    _________________________    Subjective:   Stable overnight. Continued to have dark brown sputum in small amounts. However, no additional red sputum. Continuing to improve her appetite. Good UOP and one BM overnight. No increased work of breathing or shortness of breath.     Objective:   Vital signs in last 24 hours:  Temp:  [36.1 ??C (97 ??F)-36.9 ??C (98.4 ??F)] 36.4 ??C (97.5 ??F)  Heart Rate:  [68-96] 77  SpO2 Pulse:  [59-91] 68  Resp:  [20-34] 23  BP: (94-117)/(64-74) 94/68  MAP (mmHg):  [71-86] 71  FiO2 (%):  [40 %] 40 %  SpO2:  [92 %-100 %] 96 %    Intake/Output last 3 shifts:  I/O last 3 completed shifts:  In: 1520 [P.O.:1320; IV Piggyback:200]  Out: 4850 [Urine:4850]  Intake/Output this shift:  I/O this shift:  In: 340 [P.O.:240; IV Piggyback:100]  Out: 500 [Urine:500]    Physical Exam:  General: Comfortable, nontoxic and no acute distress  ENT: MMM  CV: RRR, Normal S1/S2, no m/r/g, distal pulses 2+, no JVD, no hepatojugular reflex  Pulm: Decreased aeration on the left. No wheezing, rhonchi, or crackles. No increased work of breathing  Abd: NABS, soft, nontend  Extremities - Trace pitting lower extremity edema to the knee bilaterally. Mild clubbing. No cyanosis  Psych: engaged, appropriate mood and affect    I attest that I have reviewed the student note and that the components of the history of the present illness, the physical exam, and the assessment and plan documented were performed by me or were performed in my presence by the student where I verified the documentation and performed (or re-performed) the exam and medical decision making.   Erlinda Hong, MD      I spent >50% of a total 50 minutes in direct counseling and coordination of care for this patient.

## 2022-11-09 LAB — BASIC METABOLIC PANEL
ANION GAP: 5 mmol/L (ref 5–14)
BLOOD UREA NITROGEN: 28 mg/dL — ABNORMAL HIGH (ref 9–23)
BUN / CREAT RATIO: 21
CALCIUM: 9.4 mg/dL (ref 8.7–10.4)
CHLORIDE: 99 mmol/L (ref 98–107)
CO2: 38.1 mmol/L — ABNORMAL HIGH (ref 20.0–31.0)
CREATININE: 1.36 mg/dL — ABNORMAL HIGH
EGFR CKD-EPI (2021) FEMALE: 45 mL/min/{1.73_m2} — ABNORMAL LOW (ref >=60)
GLUCOSE RANDOM: 82 mg/dL (ref 70–179)
POTASSIUM: 3.8 mmol/L (ref 3.4–4.8)
SODIUM: 142 mmol/L (ref 135–145)

## 2022-11-09 MED ADMIN — piperacillin-tazobactam (ZOSYN) 3.375 g in sodium chloride 0.9 % (NS) 100 mL IVPB-MBP: 3.375 g | INTRAVENOUS | @ 01:00:00 | Stop: 2022-11-10

## 2022-11-09 MED ADMIN — insulin lispro (HumaLOG) injection 0-20 Units: 0-20 [IU] | SUBCUTANEOUS | @ 02:00:00

## 2022-11-09 MED ADMIN — sodium chloride 0.9 % nebulizer solution 3 mL: 3 mL | RESPIRATORY_TRACT | @ 21:00:00

## 2022-11-09 MED ADMIN — ipratropium-albuterol (DUO-NEB) 0.5-2.5 mg/3 mL nebulizer solution 3 mL: 3 mL | RESPIRATORY_TRACT | @ 04:00:00

## 2022-11-09 MED ADMIN — budesonide (PULMICORT) nebulizer solution 0.5 mg: .5 mg | RESPIRATORY_TRACT

## 2022-11-09 MED ADMIN — diphenhydrAMINE (BENADRYL) capsule/tablet 25 mg: 25 mg | ORAL | @ 18:00:00 | Stop: 2022-11-09

## 2022-11-09 MED ADMIN — piperacillin-tazobactam (ZOSYN) 3.375 g in sodium chloride 0.9 % (NS) 100 mL IVPB-MBP: 3.375 g | INTRAVENOUS | @ 17:00:00 | Stop: 2022-11-10

## 2022-11-09 MED ADMIN — ipratropium-albuterol (DUO-NEB) 0.5-2.5 mg/3 mL nebulizer solution 3 mL: 3 mL | RESPIRATORY_TRACT | @ 08:00:00

## 2022-11-09 MED ADMIN — sodium chloride 0.9 % nebulizer solution 3 mL: 3 mL | RESPIRATORY_TRACT | @ 12:00:00

## 2022-11-09 MED ADMIN — arformoterol (BROVANA) nebulizer solution 15 mcg/2 mL: 15 ug | RESPIRATORY_TRACT

## 2022-11-09 MED ADMIN — ipratropium-albuterol (DUO-NEB) 0.5-2.5 mg/3 mL nebulizer solution 3 mL: 3 mL | RESPIRATORY_TRACT | @ 12:00:00

## 2022-11-09 MED ADMIN — ipratropium-albuterol (DUO-NEB) 0.5-2.5 mg/3 mL nebulizer solution 3 mL: 3 mL | RESPIRATORY_TRACT | @ 16:00:00

## 2022-11-09 MED ADMIN — furosemide (LASIX) tablet 80 mg: 80 mg | ORAL | @ 10:00:00

## 2022-11-09 MED ADMIN — gabapentin (NEURONTIN) capsule 200 mg: 200 mg | ORAL | @ 17:00:00

## 2022-11-09 MED ADMIN — gabapentin (NEURONTIN) capsule 200 mg: 200 mg | ORAL | @ 01:00:00

## 2022-11-09 MED ADMIN — insulin lispro (HumaLOG) injection 0-20 Units: 0-20 [IU] | SUBCUTANEOUS | @ 16:00:00

## 2022-11-09 MED ADMIN — ipratropium-albuterol (DUO-NEB) 0.5-2.5 mg/3 mL nebulizer solution 3 mL: 3 mL | RESPIRATORY_TRACT | @ 21:00:00

## 2022-11-09 MED ADMIN — iron dextran (INFED) 25 mg in sodium chloride (NS) 0.9 % 25 mL IVPB: 25 mg | INTRAVENOUS | @ 18:00:00 | Stop: 2022-11-09

## 2022-11-09 MED ADMIN — arformoterol (BROVANA) nebulizer solution 15 mcg/2 mL: 15 ug | RESPIRATORY_TRACT | @ 12:00:00

## 2022-11-09 MED ADMIN — sodium chloride 0.9 % nebulizer solution 3 mL: 3 mL | RESPIRATORY_TRACT | @ 16:00:00

## 2022-11-09 MED ADMIN — piperacillin-tazobactam (ZOSYN) 3.375 g in sodium chloride 0.9 % (NS) 100 mL IVPB-MBP: 3.375 g | INTRAVENOUS | @ 06:00:00 | Stop: 2022-11-10

## 2022-11-09 MED ADMIN — gabapentin (NEURONTIN) capsule 200 mg: 200 mg | ORAL | @ 12:00:00

## 2022-11-09 MED ADMIN — furosemide (LASIX) tablet 80 mg: 80 mg | ORAL | @ 17:00:00

## 2022-11-09 MED ADMIN — insulin glargine (LANTUS) injection 18 Units: 18 [IU] | SUBCUTANEOUS | @ 02:00:00

## 2022-11-09 MED ADMIN — iron dextran (INFED) 1,500 mg in sodium chloride (NS) 0.9 % 500 mL IVPB: 1500 mg | INTRAVENOUS | @ 20:00:00 | Stop: 2022-11-09

## 2022-11-09 MED ADMIN — sildenafiL (pulm.hypertension) (REVATIO) tablet 40 mg: 40 mg | ORAL | @ 01:00:00

## 2022-11-09 MED ADMIN — sodium chloride (NS) 0.9 % infusion: 20 mL/h | INTRAVENOUS | @ 18:00:00 | Stop: 2022-11-09

## 2022-11-09 MED ADMIN — budesonide (PULMICORT) nebulizer solution 0.5 mg: .5 mg | RESPIRATORY_TRACT | @ 12:00:00

## 2022-11-09 MED ADMIN — posaconazole (NOXAFIL) delayed released tablet 200 mg: 200 mg | ORAL | @ 12:00:00

## 2022-11-09 MED ADMIN — sildenafiL (pulm.hypertension) (REVATIO) tablet 40 mg: 40 mg | ORAL | @ 17:00:00

## 2022-11-09 MED ADMIN — azithromycin (ZITHROMAX) tablet 250 mg: 250 mg | ORAL | @ 12:00:00 | Stop: 2022-11-15

## 2022-11-09 MED ADMIN — sildenafiL (pulm.hypertension) (REVATIO) tablet 40 mg: 40 mg | ORAL | @ 12:00:00

## 2022-11-09 MED ADMIN — piperacillin-tazobactam (ZOSYN) 3.375 g in sodium chloride 0.9 % (NS) 100 mL IVPB-MBP: 3.375 g | INTRAVENOUS | @ 13:00:00 | Stop: 2022-11-10

## 2022-11-09 MED ADMIN — acetaminophen (TYLENOL) tablet 650 mg: 650 mg | ORAL | @ 18:00:00 | Stop: 2022-11-09

## 2022-11-09 NOTE — Unmapped (Signed)
General Pulmonary Team Follow Up Consult Note     Date of Service: 11/09/2022  Requesting Physician: Erlinda Hong, MD   Requesting Service: Med Undesignated (MDX)  Reason for consultation: Comprehensive evaluation of acute on chronic hypoxemic and hypercarbic respiratory failure secondary to likely pneumonia superimposed on pulmonary sarcoidosis with bronchiectasis and aspergilloma, severe persistent asthma, pulmonary arterial hypertension with cor pulmonale     Hospital Problems:  Active Problems:    Pulmonary sarcoidosis (CMS-HCC)    HTN (hypertension)    Type 2 diabetes mellitus with hyperglycemia (CMS-HCC)    Acute on chronic hypoxic respiratory failure (CMS-HCC)    Chronic pulmonary aspergillosis (CMS-HCC)    Severe persistent asthma dependent on systemic steroids    Pulmonary hypertension (CMS-HCC)      HPI: Karen Barber is a 57 y.o. female with chronic hypoxemic and hypercarbic respiratory failure secondary to pulmonary sarcoidosis with bronchiectasis and aspergilloma, severe persistent asthma, pulmonary arterial hypertension with cor pulmonale.  She was recently hospitalized for acute on chronic hypoxemic and hypercarbic respiratory failure in the setting of hemoptysis.  She was diuresed and treated with antibiotics.  She saw Dr. Audrea Muscat in the pulmonary clinic 5 days ago, reporting that she felt to be at 90% of her baseline.     She presented today due to at least 48 hours of worsening shortness of breath and increased oxygen requirement.  She was using 6 L of oxygen at baseline but was needing to increase it to 8-10L in order to maintain her oxygen saturations.  Otherwise, she was desaturating to the 70s and 80s.  Over the past couple of days, she has been coughing up brown sputum with streaks, flecks, and small amounts of blood, not increased from prior.  She has found it difficult to cough up sputum in the absence of her airway clearance therapies.  She also noticed increased swelling of her lower extremities despite continuing to have a good diuretic response.  She typically sleeps propped up and had to increase her pillows further.  No nausea until she received IV fluid bolus in the ER.  She had subjective fevers at home but did not feel well enough to go and check her temperature.  Endorses soreness from coughing over her chest, left greater than right.  No GERD, dysphagia, regurgitation, or aspiration.     In the emergency room, she was given a 1 L fluid bolus over 15 minutes per sepsis protocol.  She was also given azithromycin, vancomycin and Zosyn.  Given Solu-Medrol 125 mg.  Upon arrival to the CCU, she required 11 to 12 L of supplemental oxygen after being moved to the bed.     Her prior lower respiratory cultures have primarily yielded oropharyngeal flora and more distantly Aspergillus [place].  Isolated Enterococcus faecium and Achromobacter species in July 2018.  Culture from August 2016 also had Achromobacter.  Isolated stenotrophomonas maltophilia from lung tissue in September 2016.  Her AFB cultures have also been negative with the exception of Mycobacterium fortuitum which is not typically pathogenic but can be seen in the setting of recurrent aspiration.  Has never isolated MRSA including from skin/soft tissue.    Problems addressed during this consult include acute on chronic respiratory failure, eosinophilic asthma, PAH with cor pulmonale, chronic bronchiectasis, sarcoidosis, recurrent hemoptysis. Based on these problems, the patient has high risk of morbidity/mortality which is commensurate w their risk of management options described below in the recommendations.    Assessment      Interval  Events:  Again had trouble wearing AVAPS overnight due to coughing. Feels like this is because of the short interval between evening airway clearance and putting on AVAPS.  Coughing up darker sputum this AM.  Sputum tastes terrible.         Impression: The patient has made significant clinical progress since last encounter. I personally reviewed most recent pertinent labs, imaging and micro data, from 11/09/2022, which are noted in recommendations.        Recommendations     Acute on chronic hypoxic and hypercapnic respiratory failure secondary to pneumonia superimposed on bronchiectasis and fibrosis 2/2 sarcoidosis; cor pulmonale.   Noninvasive mechanical ventilation (AVAPS) for severe chronic respiratory failure 2/2 restrictive lung disease secondary to sarcoidosis related bronchiectasis and fibrosis with an FVC less than 50% predicted.  She was called by [employer] today regarding delivery of the device to her home.  She was unsure of what date to tell them to deliver it.  I advised her to call them back and tell them that it was supposed to be delivered to the hospital so that she could start wearing it here.  Supplemental oxygen oxygen has been using large bore nasal cannula here.  At home, she only has a normal nasal cannula which is not provide her the same 6 L flow as the large bore nasal cannula would.  I have asked respiratory to provide her Oxymizer devices here.  She will need to be walked by nursing on an Oxymizer so that we can determine the exact flow rate necessary when she is discharged.  She would like to change her supplemental oxygen company from [place] to [place].  She has been dissatisfied with the customer service received through [employer] including not delivering replacement tanks when they stated they would.  This has resulted in her decreasing the oxygen flow rate to conserve oxygen so that she does not run out.  Continue airway clearance:  Nebulizing normal saline 4 times daily.  She will need a prescription for normal saline nebulizer solution sent to [facility] at the time of discharge.  Needs prescription for a new nebulizer (current 23 is 59 years old).  Recommend Pari Proneb Max as this is a heavy-duty nebulizer that is capable of providing multiple treatments on a twice daily basis.  Asked RT to provide her an [place] and show her how to attach it to the nebulizer to use in line with the saline.  Continue nebulized therapy of arformoterol and budesonide.  She favors continuing these as an outpatient rather than inhalers.    Severe pulmonary hypertension with RV failure:  Diuresis:  Agree with conversion from IV to oral Lasix 80 mg twice daily in anticipation of discharge.    Continue to monitor volume status/diuretic response for at least 24 hours on oral regimen before discharge.  Daily standing weights.  Strict I's and O's.   Trend BNP every 2 to 3 days.  Continue sildenafil 40 mg tid.  She will need a new prescription for this higher dose of sildenafil.    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  If she isolates Pseudomonas, favor continuing IV Zosyn for 10 to 14 days.  Due to her renal disease, it is not ideal to use a PICC line.  Potentially could be ACH candidate to continue peripheral IVs for antibiotics.  If no PsA, can convert to oral Augmentin for 10-14  days.  Continue azithromycin 250 mg daily for anti-inflammatory effect as long as her QTc is within safe limits.  She will need a prescription for this.  Continue home posaconazole  Outpatient double contrast barium swallow to evaluate esophagus given appearance on CT, location of pneumonia, and sore throat.    Hx of hemoptysis: No evidence of hemoptysis during hospitalization  Continue close monitoring for hemoptysis.   Outpatient VIR consultation for potential elective bronchial artery embolization.  Her March 2024 CT angio from [facility] shows visible but not significantly enlarged bronchial arteries.  Also has enlarged intercostal arteries and significant bulky mediastinal lymphadenopathy surrounding the bronchial arteries.  I reviewed the imaging with Dr. Adalberto Cole in chest radiology.  Unclear utility of embolizing intercostal arteries.    Dispo:  I will reach out to Dr. Audrea Muscat and Dr. Ala Dach regarding moving her pulmonary appointments up since she is not scheduled to see them until December 3 and November 22 respectively.    The recommendations outlined in this note were discussed w the primary team direct (in-person). Please do not hesitate to page [phone] [facility] consult) with questions. We appreciate the opportunity to assist in the care of this patient. We look forward to following with you.    Attestation      I directly provided consultative services as documented in this note and I personally spent 70 minutes face-to-face and non face-to-face in the care of this patient, which includes all pre, intra and post visit time on the date of service including examining the patient, evaluating/interpreting objective clinical data, coordinating care with the entire multidisciplinary care team and developing a comprehensive management plan as outlined above. All documented time was specific to the E/M visit and does not include any procedures that may have been performed.      Viona Gilmore, MD      Subjective & Objective     Vitals - past 24 hours  Temp:  [36.3 ??C (97.4 ??F)-36.7 ??C (98 ??F)] 36.3 ??C (97.4 ??F)  Heart Rate:  [81-91] 83  SpO2 Pulse:  [83-91] 85  Resp:  [14-22] 22  BP: (103-134)/(62-85) 103/62  FiO2 (%):  [40 %] 40 %  SpO2:  [91 %-100 %] 91 % Intake/Output  I/O last 3 completed shifts:  In: 1720 [P.O.:1320; IV Piggyback:400]  Out: 3600 [Urine:3600]      Pertinent exam findings:   General appearance - well appearing, well nourished, and in no distress, appears comfortable and in good spirits  Eyes - EOMI, PERRLA, anicteric sclerae, pink conjunctiva  Mouth - moist mucous membranes,  Neck - Trachea midline, normal neck movement  Lymphatics - not examined  Heart - regular rate and rhythm. No murmur. Sharp PT but able to split heart sounds. No JVD appreciated.   Chest - equal expansion, coarse breath sounds with crackles over left mid and right lower lobe posteriorly.  Wet productive cough.  Improved air entry.  No wheezing or rhonchi.  Easy work of breathing without accessory muscle use.   Abdomen - soft, nontender, nondistended, no masses or organomegaly  Extremities - No lower extremity edema. Mild clubbing.  Unable to appreciate cyanosis due to artificial nails   Skin - normal coloration and turgor, no rashes, no suspicious skin lesions noted  Neurological - alert, oriented, normal speech, no focal findings or movement disorder noted    Relevant Imaging:  - CXR on 11/05/2022 revealed stable findings of pulmonary sarcoidosis including fibrosis and bullous disease with superimposed hazy/patchy consolidation in  the left mid lung zone.  - CT chest with contrast on 10/21/22 revealed no pulmonary emboli or evidence of pulmonary hemorrhage.  Unchanged fibrotic pulmonary sarcoidosis with biapical upper lobe cystic bronchiectasis and scattered segmental and subsegmental fibrosis with bronchiectasis.  Unchanged bulky eggshell calcified mediastinal and hilar lymphadenopathy compatible with pulmonary sarcoidosis.  - ECHO on 10/23/2022 revealed normal EF of greater than 55% but severe pulmonary hypertension with severe dilation of the right ventricle and mildly reduced systolic function.     Arterial Blood Gas:   No results for input(s): SPECTYPEART, PHART, PCO2ART, PO2ART, HCO3ART, BEART, O2SATART in the last 24 hours.     Venous Blood Gas:   No results for input(s): PHVEN, PCO2VEN, PO2VEN, HCO3VEN, BEVEN, O2SATVEN in the last 24 hours.     Cultures:  Blood Culture, Routine (no units)   Date Value   11/05/2022 No Growth at 72 hours   11/05/2022 No Growth at 72 hours     Lower Respiratory Culture (no units)   Date Value   10/24/2022 OROPHARYNGEAL FLORA ISOLATED     WBC (10*9/L)   Date Value   11/07/2022 12.9 (H)          Other Labs:  Lab Results   Component Value Date    WBC 12.9 (H) 11/07/2022 HGB 8.7 (L) 11/07/2022    HCT 27.4 (L) 11/07/2022    PLT 205 11/07/2022     Lab Results   Component Value Date    NA 142 11/09/2022    K 3.8 11/09/2022    CL 99 11/09/2022    CO2 38.1 (H) 11/09/2022    BUN 28 (H) 11/09/2022    CREATININE 1.36 (H) 11/09/2022    GLU 82 11/09/2022    CALCIUM 9.4 11/09/2022    MG 1.7 11/08/2022    PHOS 2.7 11/05/2022     Lab Results   Component Value Date    BILITOT 1.1 11/05/2022    BILIDIR <0.10 05/30/2019    PROT 7.2 11/05/2022    ALBUMIN 3.3 (L) 11/05/2022    ALT <7 (L) 11/05/2022    AST 9 11/05/2022    ALKPHOS 84 11/05/2022    GGT 60 (H) 05/31/2011     Lab Results   Component Value Date    INR 1.02 12/11/2019    APTT 30.9 12/11/2019     Last FVL: FVC 1.89 L (48%), FEV1 1.31 L (43%), FEV1/FVC 69%.    Allergies & Home Medications   Personally reviewed in Epic    Continuous Infusions:       Scheduled Medications:    [Provider Hold] amlodipine  5 mg Oral Daily    arformoterol  15 mcg Nebulization BID (RT)    azithromycin  250 mg Oral Q24H SCH    budesonide (PULMICORT) nebulizer solution  0.5 mg Nebulization BID (RT)    [Provider Hold] furosemide  80 mg Intravenous TID    furosemide  80 mg Oral BID    gabapentin  200 mg Oral TID    insulin glargine  18 Units Subcutaneous Nightly    insulin lispro  0-20 Units Subcutaneous ACHS    ipratropium-albuterol  3 mL Nebulization Q4H (RT)    piperacillin-tazobactam (ZOSYN) IV (intermittent)  3.375 g Intravenous Q6H    posaconazole  200 mg Oral Daily    sildenafiL (pulm.hypertension)  40 mg Oral TID    sodium chloride  3 mL Nebulization 4x Daily (RT)       PRN medications:  acetaminophen, albuterol, dextrose in water,  glucagon, glucose, guaiFENesin, melatonin, cough/sore throat lozenge, ondansetron, polyethylene glycol, senna

## 2022-11-09 NOTE — Unmapped (Signed)
Problem: Comorbidity Management  Goal: Maintenance of Asthma Control  Outcome: Ongoing - Unchanged  Goal: Maintenance of COPD Symptom Control  Outcome: Ongoing - Unchanged   Pt tolerated all scheduled treatments with no adverse events. Pt expectorating minimal sputum.

## 2022-11-09 NOTE — Unmapped (Signed)
Pt received scheduled breathing tx, with exception of normal saline due to pt request.  Pt still c/o sore/hoarse throat.  Airway clearance done with acapella.  Continues to have productive cough.  AVAPS worn for a few hours, came off again due to frequent coughing.  Pt supposed to be getting home AVAPS  machine today.    Problem: Comorbidity Management  Goal: Maintenance of COPD Symptom Control  Outcome: Progressing

## 2022-11-09 NOTE — Unmapped (Signed)
Problem: Adult Inpatient Plan of Care  Goal: Plan of Care Review  Outcome: Progressing  Goal: Patient-Specific Goal (Individualized)  Outcome: Progressing  Goal: Absence of Hospital-Acquired Illness or Injury  Outcome: Progressing  Intervention: Prevent Skin Injury  Recent Flowsheet Documentation  Taken 11/09/2022 0600 by Donne Robillard, Dahlia Client, RN  Positioning for Skin: Supine/Back  Goal: Optimal Comfort and Wellbeing  Outcome: Progressing  Goal: Readiness for Transition of Care  Outcome: Progressing  Goal: Rounds/Family Conference  Outcome: Progressing     Problem: Infection  Goal: Absence of Infection Signs and Symptoms  Outcome: Progressing     Problem: Self-Care Deficit  Goal: Improved Ability to Complete Activities of Daily Living  Outcome: Progressing     Problem: Fall Injury Risk  Goal: Absence of Fall and Fall-Related Injury  Outcome: Progressing     Problem: Comorbidity Management  Goal: Maintenance of Asthma Control  Outcome: Progressing  Goal: Maintenance of COPD Symptom Control  Outcome: Progressing  Goal: Blood Glucose Levels Within Targeted Range  Outcome: Progressing  Goal: Blood Pressure in Desired Range  Outcome: Progressing

## 2022-11-09 NOTE — Unmapped (Signed)
Daily Progress Note    Assessment/Plan:   Active Problems:    Pulmonary sarcoidosis (CMS-HCC)    HTN (hypertension)    Type 2 diabetes mellitus with hyperglycemia (CMS-HCC)    Acute on chronic hypoxic respiratory failure (CMS-HCC)    Chronic pulmonary aspergillosis (CMS-HCC)    Severe persistent asthma dependent on systemic steroids    Pulmonary hypertension (CMS-HCC)       LOS: 4 days     Karen Barber is a 57 y.o. female who is presenting to [facility] with acute on chronic hypoxemic respiratory failure in the setting of pneumonia complicated by history of bronchiectasis, reactive airway disease, and aspergillosis on posaconazole. Patient's hospital course has been complicated by fluid overload in the setting of cor pulmonale.     Acute on chronic hypoxemic respiratory failure  Acute hypercapnic respiratory failure  Pneumonia from bronchiectasis and RAD exacerbation  Patient continues to be afebrile. Currently hemodynamically stable. Patient is continuing oxygen therapy of AVAPS overnight and large bore [place] during the day, both of which she is comfortable with. Home AVAPS machine arriving 8/22 (today). Dypsnea and sputum is continuing to improve. Sputum now darker brown than before. Blood culture showing no growth at 72 hours. Negative RPP and MRSA PCR and screen. AFB smear and culture negative. Sputum culture showing 1+ Gram negative rods. Awaiting speciation. If Pseudomonas, will continue IV Zosyn. If not, will transition to oral Augmentin. Potential advanced care at home patient to continue IV antibiotics at home.     Plan:  Pulmonology following, recommendations appreciated:  - Oxygen support with AVAPS at night and large bore [place] during the day, wean as tolerated.   - Airway clearance with nebulized normal saline and acapella.  - RAD nebulizer therapy with Pulmicort, Brovana, and DuoNebs  - Continue Zosyn and azithromycin. Adjust as needed pending culture results  - s/p 1x Rocephin (8/18), Vancomycin (8/18-8/19)    Dispo Plan:  - VIR consulted, outpatient evaluation for [facility] scheduled 10/25 with Dr. Duanne Limerick  - Outpatient double contrast barium swallow to evaluate esophagus given appearance on CT, location of pneumonia, and sore throat   - Change supplemental oxygen company from [place] to [place]  - Discharge home with Oxymizer device, new nebulizer (Peri Proneb Max), normal saline nebulizer solution, Aerobika, and nebulized therapy of Pulmicort and Brovana.     Cor Pulmonale  Pulmonary hypertension  RV Failure  Maintaining good UOP and did well on limiting fluid intake unless needed. Now euvolemic as edema is improved, BNP normalized. Now down trending BUN, creatinine, and contraction alkalosis with transition to 80 mg BID Lasix. Will hold off on further IV diuresis.  - Lasix 80mg  PO BID (home dose was 40mg  BID but was not having good response)  - Daily AM BMP for K/creat monitoring  - Trend BNP every 2 to 3 days (next: 8/24)  - Daily standing weights  - Strict I&Os  - Sildenafil 40 mg TID for pulmonary hypertension    CKD stage III   GFR currently likely approx 30 based on cystatin C  Baseline creatinine appears to be around 1.2-1.3. BUN and creatinine now down trending after decrease in Lasix frequency. Continue to monitor.  - Renally dose medications, avoid nephrotoxic agents  - Continue to monitor with BMP    Type 2 diabetes mellitus with hyperglycemia   HgbA1c 8.3% July 2024, Home regimen of glargine 18 units nightly, sliding scale insulin. Will continue to monitor.   - Glargine 18 units decreased to 15units at  night.   - Additional sliding scale insulin ACHS    Anemia - mixed  Likely with iron deficiency anemia (iron sat <10%) in addition to some combination of anemia of CKD and anemia of chronic disease. Iron deficiency anemia is associated with pulmonary hypertension in multiple studies.   - IV Iron supplementation    Chronic pulmonary aspergillosis  AFB smear and culture negative  - Continue home posaconazole 200 mg daily  - EKG on admission obtained with normal Qtc of 411 ms, repeat on 8/20 show normal Qtc of 425 ms.     HTN  - Hold home amlodipine due to low pressure episode  - S/p norepinephrine 11/06/2022    PPX. Hold Lovenox/chemical DVT prophylaxis in setting of possible mopped assist, SCDs only, ambulation as tolerated     Diet - Regular diet     Code Status / HCDM  - DNI/DNR, Discussed with patient at the time of rounds 11/06/2022  -  HCDM (patient stated preference): Karen Barber - Domestic Partner - [phone]    _________________________    Subjective:   Stable overnight. Continued to have dark brown sputum in small amounts, color now darker. No additional red sputum. Maintaining solid appetite but not great. Good UOP. No increased work of breathing or shortness of breath.    Objective:   Vital signs in last 24 hours:  Temp:  [36.3 ??C (97.4 ??F)-36.7 ??C (98 ??F)] 36.3 ??C (97.4 ??F)  Heart Rate:  [81-91] 83  SpO2 Pulse:  [83-91] 85  Resp:  [14-22] 22  BP: (103-134)/(62-85) 103/62  MAP (mmHg):  [77-100] 77  FiO2 (%):  [40 %] 40 %  SpO2:  [91 %-100 %] 91 %    Intake/Output last 3 shifts:  I/O last 3 completed shifts:  In: 1720 [P.O.:1320; IV Piggyback:400]  Out: 3600 [Urine:3600]  Intake/Output this shift:  I/O this shift:  In: 340 [P.O.:240; IV Piggyback:100]  Out: 200 [Urine:200]    Physical Exam:  General: Comfortable, nontoxic and no acute distress  ENT: MMM  CV: RRR, Normal S1/S2, no m/r/g, distal pulses 2+, no JVD, no hepatojugular reflex  Pulm: Decreased aeration on the left. No wheezing, rhonchi, or crackles. No increased work of breathing  Abd: NABS, soft, nontend  Extremities - Trace pitting lower extremity edema to the shins bilaterally. No clubbing. No cyanosis  Psych: engaged, appropriate mood and affect    I attest that I have reviewed the student note and that the components of the history of the present illness, the physical exam, and the assessment and plan documented were performed by me or were performed in my presence by the student where I verified the documentation and performed (or re-performed) the exam and medical decision making.   Erlinda Hong, MD    I spent >50% of a total 40 min in direct counseling and coordination of care for this patient.

## 2022-11-10 LAB — BASIC METABOLIC PANEL
ANION GAP: 5 mmol/L (ref 5–14)
BLOOD UREA NITROGEN: 20 mg/dL (ref 9–23)
BUN / CREAT RATIO: 17
CALCIUM: 9.4 mg/dL (ref 8.7–10.4)
CHLORIDE: 99 mmol/L (ref 98–107)
CO2: 38.4 mmol/L — ABNORMAL HIGH (ref 20.0–31.0)
CREATININE: 1.21 mg/dL — ABNORMAL HIGH
EGFR CKD-EPI (2021) FEMALE: 52 mL/min/{1.73_m2} — ABNORMAL LOW (ref >=60)
GLUCOSE RANDOM: 104 mg/dL (ref 70–179)
POTASSIUM: 3.7 mmol/L (ref 3.4–4.8)
SODIUM: 142 mmol/L (ref 135–145)

## 2022-11-10 MED ADMIN — piperacillin-tazobactam (ZOSYN) 3.375 g in sodium chloride 0.9 % (NS) 100 mL IVPB-MBP: 3.375 g | INTRAVENOUS | @ 07:00:00 | Stop: 2022-11-10

## 2022-11-10 MED ADMIN — furosemide (LASIX) tablet 80 mg: 80 mg | ORAL | @ 18:00:00

## 2022-11-10 MED ADMIN — insulin lispro (HumaLOG) injection 0-20 Units: 0-20 [IU] | SUBCUTANEOUS | @ 16:00:00

## 2022-11-10 MED ADMIN — sildenafiL (pulm.hypertension) (REVATIO) tablet 40 mg: 40 mg | ORAL | @ 13:00:00

## 2022-11-10 MED ADMIN — ipratropium-albuterol (DUO-NEB) 0.5-2.5 mg/3 mL nebulizer solution 3 mL: 3 mL | RESPIRATORY_TRACT | @ 08:00:00 | Stop: 2022-11-10

## 2022-11-10 MED ADMIN — sodium chloride 0.9 % nebulizer solution 3 mL: 3 mL | RESPIRATORY_TRACT | @ 01:00:00

## 2022-11-10 MED ADMIN — arformoterol (BROVANA) nebulizer solution 15 mcg/2 mL: 15 ug | RESPIRATORY_TRACT | @ 01:00:00

## 2022-11-10 MED ADMIN — posaconazole (NOXAFIL) delayed released tablet 200 mg: 200 mg | ORAL | @ 13:00:00

## 2022-11-10 MED ADMIN — insulin lispro (HumaLOG) injection 0-20 Units: 0-20 [IU] | SUBCUTANEOUS | @ 02:00:00

## 2022-11-10 MED ADMIN — azithromycin (ZITHROMAX) tablet 250 mg: 250 mg | ORAL | @ 13:00:00 | Stop: 2022-11-15

## 2022-11-10 MED ADMIN — gabapentin (NEURONTIN) capsule 200 mg: 200 mg | ORAL | @ 01:00:00

## 2022-11-10 MED ADMIN — insulin lispro (HumaLOG) injection 0-20 Units: 0-20 [IU] | SUBCUTANEOUS | @ 21:00:00

## 2022-11-10 MED ADMIN — sodium chloride 3 % NEBULIZER solution 4 mL: 4 mL | RESPIRATORY_TRACT | @ 19:00:00

## 2022-11-10 MED ADMIN — cefepime (MAXIPIME) 2 g in sodium chloride 0.9 % (NS) 100 mL IVPB-MBP: 2 g | INTRAVENOUS | @ 18:00:00 | Stop: 2022-11-17

## 2022-11-10 MED ADMIN — acetaminophen (TYLENOL) tablet 650 mg: 650 mg | ORAL | @ 23:00:00

## 2022-11-10 MED ADMIN — ipratropium-albuterol (DUO-NEB) 0.5-2.5 mg/3 mL nebulizer solution 3 mL: 3 mL | RESPIRATORY_TRACT | @ 12:00:00 | Stop: 2022-11-10

## 2022-11-10 MED ADMIN — sildenafiL (pulm.hypertension) (REVATIO) tablet 40 mg: 40 mg | ORAL | @ 01:00:00

## 2022-11-10 MED ADMIN — ipratropium-albuterol (DUO-NEB) 0.5-2.5 mg/3 mL nebulizer solution 3 mL: 3 mL | RESPIRATORY_TRACT | @ 05:00:00 | Stop: 2022-11-10

## 2022-11-10 MED ADMIN — sodium chloride 0.9 % nebulizer solution 3 mL: 3 mL | RESPIRATORY_TRACT | @ 15:00:00 | Stop: 2022-11-10

## 2022-11-10 MED ADMIN — budesonide (PULMICORT) nebulizer solution 0.5 mg: .5 mg | RESPIRATORY_TRACT | @ 12:00:00

## 2022-11-10 MED ADMIN — piperacillin-tazobactam (ZOSYN) 3.375 g in sodium chloride 0.9 % (NS) 100 mL IVPB-MBP: 3.375 g | INTRAVENOUS | @ 12:00:00 | Stop: 2022-11-10

## 2022-11-10 MED ADMIN — sodium chloride 0.9 % nebulizer solution 3 mL: 3 mL | RESPIRATORY_TRACT | @ 12:00:00 | Stop: 2022-11-10

## 2022-11-10 MED ADMIN — furosemide (LASIX) tablet 80 mg: 80 mg | ORAL | @ 09:00:00

## 2022-11-10 MED ADMIN — sildenafiL (pulm.hypertension) (REVATIO) tablet 40 mg: 40 mg | ORAL | @ 18:00:00

## 2022-11-10 MED ADMIN — ipratropium-albuterol (DUO-NEB) 0.5-2.5 mg/3 mL nebulizer solution 3 mL: 3 mL | RESPIRATORY_TRACT | @ 15:00:00 | Stop: 2022-11-10

## 2022-11-10 MED ADMIN — ipratropium-albuterol (DUO-NEB) 0.5-2.5 mg/3 mL nebulizer solution 3 mL: 3 mL | RESPIRATORY_TRACT

## 2022-11-10 MED ADMIN — ipratropium-albuterol (DUO-NEB) 0.5-2.5 mg/3 mL nebulizer solution 3 mL: 3 mL | RESPIRATORY_TRACT | @ 19:00:00

## 2022-11-10 MED ADMIN — gabapentin (NEURONTIN) capsule 200 mg: 200 mg | ORAL | @ 13:00:00

## 2022-11-10 MED ADMIN — menthol (COUGH DROPS) lozenge 1 lozenge: 1 | ORAL | @ 01:00:00

## 2022-11-10 MED ADMIN — arformoterol (BROVANA) nebulizer solution 15 mcg/2 mL: 15 ug | RESPIRATORY_TRACT | @ 12:00:00

## 2022-11-10 MED ADMIN — budesonide (PULMICORT) nebulizer solution 0.5 mg: .5 mg | RESPIRATORY_TRACT | @ 01:00:00

## 2022-11-10 MED ADMIN — gabapentin (NEURONTIN) capsule 200 mg: 200 mg | ORAL | @ 18:00:00

## 2022-11-10 MED ADMIN — insulin glargine (LANTUS) injection 15 Units: 15 [IU] | SUBCUTANEOUS | @ 01:00:00

## 2022-11-10 MED ADMIN — piperacillin-tazobactam (ZOSYN) 3.375 g in sodium chloride 0.9 % (NS) 100 mL IVPB-MBP: 3.375 g | INTRAVENOUS | @ 01:00:00 | Stop: 2022-11-10

## 2022-11-10 NOTE — Unmapped (Signed)
Problem: Adult Inpatient Plan of Care  Goal: Plan of Care Review  Outcome: Progressing  Flowsheets (Taken 11/09/2022 1748)  Progress: improving  Plan of Care Reviewed With: patient  Goal: Patient-Specific Goal (Individualized)  Outcome: Progressing  Goal: Absence of Hospital-Acquired Illness or Injury  Outcome: Progressing  Intervention: Identify and Manage Fall Risk  Recent Flowsheet Documentation  Taken 11/09/2022 0800 by Scherrie Gerlach, RN  Safety Interventions:   fall reduction program maintained   lighting adjusted for tasks/safety   low bed   nonskid shoes/slippers when out of bed   toileting scheduled  Intervention: Prevent Skin Injury  Recent Flowsheet Documentation  Taken 11/09/2022 1600 by Scherrie Gerlach, RN  Positioning for Skin: Left  Taken 11/09/2022 1400 by Scherrie Gerlach, RN  Positioning for Skin: Right  Taken 11/09/2022 1200 by Scherrie Gerlach, RN  Positioning for Skin: Left  Taken 11/09/2022 1000 by Scherrie Gerlach, RN  Positioning for Skin: Right  Taken 11/09/2022 0800 by Scherrie Gerlach, RN  Positioning for Skin: Left  Device Skin Pressure Protection:   absorbent pad utilized/changed   adhesive use limited   positioning supports utilized   pressure points protected   tubing/devices free from skin contact  Skin Protection:   adhesive use limited   cleansing with dimethicone incontinence wipes   incontinence pads utilized   protective footwear used   tubing/devices free from skin contact  Intervention: Prevent and Manage VTE (Venous Thromboembolism) Risk  Recent Flowsheet Documentation  Taken 11/09/2022 1600 by Scherrie Gerlach, RN  Anti-Embolism Device Type: SCD, Knee  Anti-Embolism Intervention: On  Anti-Embolism Device Location: BLE  Taken 11/09/2022 1200 by Scherrie Gerlach, RN  Anti-Embolism Device Type: SCD, Knee  Anti-Embolism Intervention: On  Anti-Embolism Device Location: BLE  Taken 11/09/2022 1000 by Scherrie Gerlach, RN  Anti-Embolism Device Type: SCD, Knee  Anti-Embolism Intervention: On  Anti-Embolism Device Location: BLE  Taken 11/09/2022 0800 by Scherrie Gerlach, RN  Anti-Embolism Device Type: SCD, Knee  Anti-Embolism Intervention: On  Anti-Embolism Device Location: BLE  Intervention: Prevent Infection  Recent Flowsheet Documentation  Taken 11/09/2022 0800 by Scherrie Gerlach, RN  Infection Prevention:   hand hygiene promoted   personal protective equipment utilized   rest/sleep promoted   single patient room provided  Goal: Optimal Comfort and Wellbeing  Outcome: Progressing  Goal: Readiness for Transition of Care  Outcome: Progressing  Goal: Rounds/Family Conference  Outcome: Progressing

## 2022-11-10 NOTE — Unmapped (Signed)
Pt remains SD status. Pt aox4, vss, afebrile, satting >90% on 6L via oxymizer. Pt transitioned to home cpap briefly but said I had a nightmare about something crawling on my face and took if off. Pt refused cpap for the remainder of the shift. No c/o pain. Voiding via purewick or up to the bathroom, adequate UOP, see flowsheet for details. No bms noted, active bowel sounds throughout. Safety education complete. Continuous monitoring ongoing.      Problem: Adult Inpatient Plan of Care  Goal: Plan of Care Review  Outcome: Progressing  Goal: Patient-Specific Goal (Individualized)  Outcome: Progressing  Goal: Absence of Hospital-Acquired Illness or Injury  Outcome: Progressing  Intervention: Identify and Manage Fall Risk  Recent Flowsheet Documentation  Taken 11/09/2022 2000 by Sherrey North, Dahlia Client, RN  Safety Interventions:   aspiration precautions   bleeding precautions   commode/urinal/bedpan at bedside   fall reduction program maintained   lighting adjusted for tasks/safety   nonskid shoes/slippers when out of bed   infection management  Intervention: Prevent Skin Injury  Recent Flowsheet Documentation  Taken 11/09/2022 2200 by Shanequia Kendrick, Dahlia Client, RN  Positioning for Skin: Supine/Back  Taken 11/09/2022 2000 by Waldo Laine, RN  Positioning for Skin: Supine/Back  Device Skin Pressure Protection:   absorbent pad utilized/changed   positioning supports utilized  Skin Protection:   cleansing with dimethicone incontinence wipes   incontinence pads utilized   tubing/devices free from skin contact  Taken 11/09/2022 1935 by Waldo Laine, RN  Positioning for Skin: Supine/Back  Intervention: Prevent and Manage VTE (Venous Thromboembolism) Risk  Recent Flowsheet Documentation  Taken 11/09/2022 1935 by Marlyce Mcdougald, Dahlia Client, RN  Anti-Embolism Device Type: SCD, Knee  Anti-Embolism Intervention: Off  Anti-Embolism Device Location: BLE  Intervention: Prevent Infection  Recent Flowsheet Documentation  Taken 11/09/2022 2000 by Malaquias Lenker, Dahlia Client, RN  Infection Prevention:   cohorting utilized   hand hygiene promoted   rest/sleep promoted  Goal: Optimal Comfort and Wellbeing  Outcome: Progressing  Goal: Readiness for Transition of Care  Outcome: Progressing  Goal: Rounds/Family Conference  Outcome: Progressing     Problem: Infection  Goal: Absence of Infection Signs and Symptoms  Outcome: Progressing  Intervention: Prevent or Manage Infection  Recent Flowsheet Documentation  Taken 11/09/2022 2000 by Olawale Marney, Dahlia Client, RN  Infection Management: aseptic technique maintained     Problem: Self-Care Deficit  Goal: Improved Ability to Complete Activities of Daily Living  Outcome: Progressing     Problem: Fall Injury Risk  Goal: Absence of Fall and Fall-Related Injury  Outcome: Progressing  Intervention: Promote Injury-Free Environment  Recent Flowsheet Documentation  Taken 11/09/2022 2000 by Adriann Thau, Dahlia Client, RN  Safety Interventions:   aspiration precautions   bleeding precautions   commode/urinal/bedpan at bedside   fall reduction program maintained   lighting adjusted for tasks/safety   nonskid shoes/slippers when out of bed   infection management     Problem: Comorbidity Management  Goal: Maintenance of Asthma Control  Outcome: Progressing  Goal: Maintenance of COPD Symptom Control  Outcome: Progressing  Goal: Blood Glucose Levels Within Targeted Range  Outcome: Progressing  Intervention: Monitor and Manage Glycemia  Recent Flowsheet Documentation  Taken 11/09/2022 2000 by Everson Mott, Dahlia Client, RN  Glycemic Management:   blood glucose monitored   oral hydration promoted  Goal: Blood Pressure in Desired Range  Outcome: Progressing

## 2022-11-10 NOTE — Unmapped (Signed)
General Pulmonary Team Follow Up Consult Note     Date of Service: 11/10/2022  Requesting Physician: Erlinda Hong, MD   Requesting Service: Med Undesignated (MDX)  Reason for consultation: Comprehensive evaluation of acute on chronic hypoxemic and hypercarbic respiratory failure secondary to likely pneumonia superimposed on pulmonary sarcoidosis with bronchiectasis and aspergilloma, severe persistent asthma, pulmonary arterial hypertension with cor pulmonale     Hospital Problems:  Active Problems:    Pulmonary sarcoidosis (CMS-HCC)    HTN (hypertension)    Type 2 diabetes mellitus with hyperglycemia (CMS-HCC)    Acute on chronic hypoxic respiratory failure (CMS-HCC)    Chronic pulmonary aspergillosis (CMS-HCC)    Severe persistent asthma dependent on systemic steroids    Pulmonary hypertension (CMS-HCC)      HPI: Karen Barber is a 57 y.o. female with chronic hypoxemic and hypercarbic respiratory failure secondary to pulmonary sarcoidosis with bronchiectasis and aspergilloma, severe persistent asthma, pulmonary arterial hypertension with cor pulmonale.  She was recently hospitalized for acute on chronic hypoxemic and hypercarbic respiratory failure in the setting of hemoptysis.  She was diuresed and treated with antibiotics.  She saw Dr. Audrea Muscat in the pulmonary clinic 5 days ago, reporting that she felt to be at 90% of her baseline.     She presented today due to at least 48 hours of worsening shortness of breath and increased oxygen requirement.  She was using 6 L of oxygen at baseline but was needing to increase it to 8-10L in order to maintain her oxygen saturations.  Otherwise, she was desaturating to the 70s and 80s.  Over the past couple of days, she has been coughing up brown sputum with streaks, flecks, and small amounts of blood, not increased from prior.  She has found it difficult to cough up sputum in the absence of her airway clearance therapies.  She also noticed increased swelling of her lower extremities despite continuing to have a good diuretic response.  She typically sleeps propped up and had to increase her pillows further.  No nausea until she received IV fluid bolus in the ER.  She had subjective fevers at home but did not feel well enough to go and check her temperature.  Endorses soreness from coughing over her chest, left greater than right.  No GERD, dysphagia, regurgitation, or aspiration.     In the emergency room, she was given a 1 L fluid bolus over 15 minutes per sepsis protocol.  She was also given azithromycin, vancomycin and Zosyn.  Given Solu-Medrol 125 mg.  Upon arrival to the CCU, she required 11 to 12 L of supplemental oxygen after being moved to the bed.     Her prior lower respiratory cultures have primarily yielded oropharyngeal flora and more distantly Aspergillus [place].  Isolated Enterococcus faecium and Achromobacter species in July 2018.  Culture from August 2016 also had Achromobacter.  Isolated stenotrophomonas maltophilia from lung tissue in September 2016.  Her AFB cultures have also been negative with the exception of Mycobacterium fortuitum which is not typically pathogenic but can be seen in the setting of recurrent aspiration.  Has never isolated MRSA including from skin/soft tissue.    Problems addressed during this consult include acute on chronic respiratory failure, eosinophilic asthma, PAH with cor pulmonale, chronic bronchiectasis, sarcoidosis, recurrent hemoptysis. Based on these problems, the patient has high risk of morbidity/mortality which is commensurate w their risk of management options described below in the recommendations.    Assessment      Interval  Events:  Pulled off AVAPS mask last night because had dream that cat was on her face.  Continues to cough up brown sputum.  Feels euvolemic - thinks that weight gain is 2/2 eating more in hospital. I came in while she was doing sit-ups in bed and asks if it is okay for her to exercise while here.         Impression: The patient has made significant clinical progress since last encounter. I personally reviewed most recent pertinent labs, imaging and micro data, from 11/10/2022, which are noted in recommendations.        Recommendations     Acute on chronic hypoxic and hypercapnic respiratory failure secondary to pneumonia superimposed on bronchiectasis and fibrosis 2/2 sarcoidosis; cor pulmonale.   Noninvasive mechanical ventilation (AVAPS) for severe chronic respiratory failure 2/2 restrictive lung disease secondary to sarcoidosis related bronchiectasis and fibrosis with an FVC less than 50% predicted.  Okay to use home machine while in hospital.  Supplemental oxygen:  Goal O2 saturation 90-95% via oxymizer.  Supplemental oxygen delivery changed to [employer]  Continue airway clearance:  Trial of mixture of duonebs and hypertonic saline 3% (creates about 1.5% hypertonic saline) 4 times daily with Aerobika in line.   Her tolerance of above will determine whether to send script for normal saline (0.9%) vs hypertonic saline 3% to [facility].  [employer] providing Pari Proneb Max as this is a heavy-duty nebulizer that is capable of providing multiple treatments on a twice daily basis. Although received nebulizer from [facility] in 2022, it was for Tyvaso and cannot be used for other medications. Nebulizer to be delivered to room today.  RT provided her an [place] and showed her how to attach it to use in line with the nebulizer.  Continue nebulized therapy of arformoterol and budesonide.  She favors continuing these as an outpatient rather than inhalers.  Will need scripts.    Severe pulmonary hypertension with RV failure: Weight increased but does not appear volume up on exam.  Diuresis:  Agree with continuing oral Lasix 80 mg twice daily.    Once off IV antibiotics, will need to see if still needs this dose.  Daily standing weights.  Strict I's and O's.   Trend BNP every 2 to 3 days.  Continue sildenafil 40 mg tid.  She will need a new prescription for this higher dose of sildenafil.  Provide her written instructions regarding weighing herself at home and when to reach out about weight gain.    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  Serratia PNA:   Change Zosyn to cefepime 2 gm IV q8 for better Serratia coverage.  Await sensitivity results.  If sensitive to FQ, would repeat EKG before discharge while on posaconazole, azithromycin, and FQ.  Although has used Bactrim before, I have concerns given renal disease.  Due to her renal disease, it is not ideal to use a PICC line.  Potentially could be ACH candidate to continue peripheral IVs for antibiotics.  Continue azithromycin 250 mg daily for anti-inflammatory effect as long as her QTc is within safe limits.  She will need a prescription for this.  Continue home posaconazole  Outpatient double contrast barium swallow to evaluate esophagus given appearance on CT, location of pneumonia, and sore throat.    Hx of hemoptysis: No evidence of hemoptysis during hospitalization  Continue close monitoring for hemoptysis.   Outpatient VIR consultation for potential elective  bronchial artery embolization.  Her March 2024 CT angio from [facility] shows visible but not significantly enlarged bronchial arteries.  Also has enlarged intercostal arteries and significant bulky mediastinal lymphadenopathy surrounding the bronchial arteries.  I reviewed the imaging with Dr. Adalberto Cole in chest radiology.  Unclear utility of embolizing intercostal arteries.    Dispo:  I reached out to our schedules to try to move up her appts with Dr. Audrea Muscat and Dr. Ala Dach following this hospitalization and treatment plan changes.  Please provide in her discharge summary written instructions regarding order of nebulizer treatments as detailed below. Please note that you may need to update them to indicate that she should should use the normal saline after the Duonebs (if she doesn't tolerate the 1.5% concentration).    Order of Treatments (twice a day)  Bronchodilator (Albuterol/Ipratropium): Relaxes the large smooth muscle and opens up the lungs. Mix with hypertonic saline 3%.  Hypertonic Saline 3% (mixed with Duonebs): Hydrates the lungs and helps to thin the mucus to make it easier to cough up. Can be done with airway clearance.  Airway Clearance Waymon Budge): Helps to mobilize the mucus to make it easier to cough up. Perform with steps 1 & 2.  Inhaled Corticosteroids and Long Acting Bronchodilator (Budesonide and Arformoterol): Maintenance medications; take daily even if you feel fine. To be done after lungs are open and cleaned out.     The recommendations outlined in this note were discussed w the primary team via epic chat. Please do not hesitate to page [phone] [facility] consult) with questions. We appreciate the opportunity to assist in the care of this patient. We look forward to following with you.    Attestation      I directly provided consultative services as documented in this note and I personally spent 75 minutes face-to-face and non face-to-face in the care of this patient, which includes all pre, intra and post visit time on the date of service including examining the patient, evaluating/interpreting objective clinical data, coordinating care with the entire multidisciplinary care team and developing a comprehensive management plan as outlined above. All documented time was specific to the E/M visit and does not include any procedures that may have been performed.   This includes time speaking with [employer] representative about her nebulizer and updating Dr. Audrea Muscat on plan/hospital course.    Viona Gilmore, MD MPH    Subjective & Objective     Vitals - past 24 hours  Temp:  [36.6 ??C (97.8 ??F)-37.1 ??C (98.8 ??F)] 36.7 ??C (98.1 ??F)  Heart Rate:  [74-97] 82  SpO2 Pulse:  [74-99] 84  Resp:  [19-43] 25  BP: (89-144)/(59-86) 108/60  SpO2:  [88 %-100 %] 89 % Intake/Output  I/O last 3 completed shifts:  In: 2760.3 [P.O.:1560; I.V.:30.3; IV Piggyback:1170]  Out: 2700 [Urine:2700]      Pertinent exam findings:   General appearance - well appearing, well nourished, and in no distress, appears comfortable and in good spirits  Eyes - EOMI, PERRLA, anicteric sclerae, pink conjunctiva  Mouth - moist mucous membranes,  Neck - Trachea midline, normal neck movement  Lymphatics - not examined  Heart - regular rate and rhythm. No murmur. Sharp P2 but able to split heart sounds. No JVD appreciated.   Chest - equal expansion, coarse breath sounds with increased coarse crackles over left mid and right lower lobe posteriorly. Decreased breath sounds over left base. Wet productive cough.  Improved air entry.  No wheezing or rhonchi.  Easy work  of breathing without accessory muscle use.   Abdomen - soft, nontender, nondistended, no masses or organomegaly  Extremities - No lower extremity edema. Mild clubbing.  Unable to appreciate cyanosis due to artificial nails   Skin - normal coloration and turgor, no rashes, no suspicious skin lesions noted  Neurological - alert, oriented, normal speech, no focal findings or movement disorder noted    Relevant Imaging:  - CXR on 11/05/2022 revealed stable findings of pulmonary sarcoidosis including fibrosis and bullous disease with superimposed hazy/patchy consolidation in the left mid lung zone.  - CT chest with contrast on 10/21/22 revealed no pulmonary emboli or evidence of pulmonary hemorrhage.  Unchanged fibrotic pulmonary sarcoidosis with biapical upper lobe cystic bronchiectasis and scattered segmental and subsegmental fibrosis with bronchiectasis.  Unchanged bulky eggshell calcified mediastinal and hilar lymphadenopathy compatible with pulmonary sarcoidosis.  - ECHO on 10/23/2022 revealed normal EF of greater than 55% but severe pulmonary hypertension with severe dilation of the right ventricle and mildly reduced systolic function.     Arterial Blood Gas:   No results for input(s): SPECTYPEART, PHART, PCO2ART, PO2ART, HCO3ART, BEART, O2SATART in the last 24 hours.     Venous Blood Gas:   No results for input(s): PHVEN, PCO2VEN, PO2VEN, HCO3VEN, BEVEN, O2SATVEN in the last 24 hours.     Cultures:  Blood Culture, Routine (no units)   Date Value   11/05/2022 No Growth at 4 days   11/05/2022 No Growth at 4 days     Lower Respiratory Culture (no units)   Date Value   11/08/2022 3+ Serratia marcescens (A)   11/08/2022 1+ Oropharyngeal Flora Isolated     WBC (10*9/L)   Date Value   11/07/2022 12.9 (H)          Other Labs:  Lab Results   Component Value Date    WBC 12.9 (H) 11/07/2022    HGB 8.7 (L) 11/07/2022    HCT 27.4 (L) 11/07/2022    PLT 205 11/07/2022     Lab Results   Component Value Date    NA 142 11/10/2022    K 3.7 11/10/2022    CL 99 11/10/2022    CO2 38.4 (H) 11/10/2022    BUN 20 11/10/2022    CREATININE 1.21 (H) 11/10/2022    GLU 104 11/10/2022    CALCIUM 9.4 11/10/2022    MG 1.7 11/08/2022    PHOS 2.7 11/05/2022     Lab Results   Component Value Date    BILITOT 1.1 11/05/2022    BILIDIR <0.10 05/30/2019    PROT 7.2 11/05/2022    ALBUMIN 3.3 (L) 11/05/2022    ALT <7 (L) 11/05/2022    AST 9 11/05/2022    ALKPHOS 84 11/05/2022    GGT 60 (H) 05/31/2011     Lab Results   Component Value Date    INR 1.02 12/11/2019    APTT 30.9 12/11/2019     Last FVL: FVC 1.89 L (48%), FEV1 1.31 L (43%), FEV1/FVC 69%.    Allergies & Home Medications   Personally reviewed in Epic    Continuous Infusions:    sodium chloride 20 mL/hr (11/09/22 1600)       Scheduled Medications:    [Provider Hold] amlodipine  5 mg Oral Daily    arformoterol  15 mcg Nebulization BID (RT)    azithromycin  250 mg Oral Q24H SCH    budesonide (PULMICORT) nebulizer solution  0.5 mg Nebulization BID (RT)    furosemide  80 mg  Oral BID    gabapentin  200 mg Oral TID    insulin glargine  15 Units Subcutaneous Nightly    insulin lispro  0-20 Units Subcutaneous ACHS    ipratropium-albuterol  3 mL Nebulization Q4H (RT)    posaconazole  200 mg Oral Daily    sildenafiL (pulm.hypertension)  40 mg Oral TID    sodium chloride  3 mL Nebulization 4x Daily (RT)       PRN medications:  acetaminophen, albuterol, dextrose in water, Implement **AND** Care order/instruction **AND** Vital signs **AND** Adult Oxygen therapy **AND** sodium chloride **AND** sodium chloride 0.9% **AND** diphenhydrAMINE **AND** famotidine **AND** methylPREDNISolone sodium succinate **AND** EPINEPHrine, glucagon, glucose, guaiFENesin, melatonin, cough/sore throat lozenge, ondansetron, polyethylene glycol, senna

## 2022-11-10 NOTE — Unmapped (Signed)
Problem: Comorbidity Management  Goal: Maintenance of Asthma Control  Outcome: Progressing  Goal: Maintenance of COPD Symptom Control  Outcome: Progressing   Pt tolerated all scheduled tx and airway clearence. Pt tolerating switch from large bore to oximiser, pt mentioned having more support. Did instruct the pt how to use Aerobika attach with the neb. Will continue with pt teaching. Pt coughing up sputum in between, pt do like [place].

## 2022-11-10 NOTE — Unmapped (Addendum)
Brief addendum to RN home O2 eval:     Oxygen saturation on room air with patient at rest  = 76%  Oxygen saturation on room air with exertion / ambulation = 64%  Oxygen saturation with exertion / ambulating on oxygen = 91% on 6 lpm via oxymizer

## 2022-11-10 NOTE — Unmapped (Signed)
Patient remains on large bore vnasal canula with 6 to 8lpm flow. Desaturates quickly with activities. Breathing treatments given with [place]

## 2022-11-10 NOTE — Unmapped (Signed)
Daily Progress Note    Assessment/Plan:   Active Problems:    Pulmonary sarcoidosis (CMS-HCC)    HTN (hypertension)    Type 2 diabetes mellitus with hyperglycemia (CMS-HCC)    Acute on chronic hypoxic respiratory failure (CMS-HCC)    Chronic pulmonary aspergillosis (CMS-HCC)    Severe persistent asthma dependent on systemic steroids    Pulmonary hypertension (CMS-HCC)       LOS: 5 days     Karen Barber is a 57 y.o. female who is presenting to [facility] with acute on chronic hypoxemic respiratory failure in the setting of pneumonia complicated by history of bronchiectasis, reactive airway disease, and aspergillosis on posaconazole. Patient's hospital course has been complicated by fluid overload in the setting of cor pulmonale.     Acute on chronic hypoxemic respiratory failure  Acute hypercapnic respiratory failure  Pneumonia from bronchiectasis and RAD exacerbation  Patient continues to be afebrile. Currently hemodynamically stable. Patient is continuing oxygen therapy of AVAPS overnight and large bore [place] during the day, both of which she is comfortable with. Home AVAPS machine arriving 8/22 (today). Dypsnea and sputum is continuing to improve. Sputum now darker brown than before. Blood culture showing no growth at 72 hours. Negative RPP and MRSA PCR and screen. AFB smear and culture negative. Sputum culture showing 1+ Gram negative rods, resulted in Serratia marcescens. Awaiting susceptibilities.    Plan:  Pulmonology following, recommendations appreciated:  - Oxygen support with AVAPS at night and large bore [place] during the day, wean as tolerated.   - Airway clearance with nebulized normal saline mixed with 3% saline and acapella.  - RAD nebulizer therapy with Pulmicort, Brovana, and DuoNebs  - Continue azithromycin. Will change Zosyn to Cefepime until susceptibility comes back per [facility] antibiogram  - s/p 1x Rocephin (8/18), Vancomycin (8/18-8/19), Zosyn (8/18-8/23)    Dispo Plan:  - VIR consulted, outpatient evaluation for BAE scheduled 10/25 with Dr. Duanne Limerick  - Outpatient double contrast barium swallow to evaluate esophagus given appearance on CT, location of pneumonia, and sore throat   - Changed supplemental oxygen company from [place] to [place]  - Have patient call [employer] to discontinue services once she receives all materials from [place]  - Discharge home with Oxymizer device, new nebulizer, normal saline nebulizer solution, Aerobika, and Anoro daily.     Cor Pulmonale  Pulmonary hypertension  RV Failure  Maintaining good UOP and did well on limiting fluid intake unless needed. Now euvolemic as edema is improved, BNP normalized. Now down trending BUN, creatinine, and contraction alkalosis with transition to 80 mg BID Lasix. Will hold off on further IV diuresis.  - Lasix 80mg  PO BID (home dose was 40mg  BID but was not having good response)  - Daily AM BMP for K/creat monitoring  - Trend BNP every 2 to 3 days (next: 8/24)  - Daily standing weights  - Strict I&Os  - Sildenafil 40 mg TID for pulmonary hypertension    CKD stage III   GFR currently likely approx 30 based on cystatin C  Baseline creatinine appears to be around 1.2-1.3. BUN and creatinine now down trending after decrease in Lasix frequency. Continue to monitor.  - Renally dose medications, avoid nephrotoxic agents  - Continue to monitor with BMP    Type 2 diabetes mellitus with hyperglycemia   HgbA1c 8.3% July 2024, Home regimen of glargine 18 units nightly, sliding scale insulin. Will continue to monitor.   - Glargine 18 units decreased to 15units at night.   -  Additional sliding scale insulin ACHS    Anemia - mixed  Likely with iron deficiency anemia (iron sat <10%) in addition to some combination of anemia of CKD and anemia of chronic disease. Iron deficiency anemia is associated with pulmonary hypertension in multiple studies. S/p iron supplementation with IV iron 8/22 to replete 1.5 gram deficit.     Chronic pulmonary aspergillosis  AFB smear and culture negative  - Continue home posaconazole 200 mg daily  - EKG on admission obtained with normal Qtc of 411 ms, repeat on 8/20 show normal Qtc of 425 ms.     HTN  - Hold home amlodipine indefinitely - BP well controlled and may have contributed to lower extremity edema    PPX. Hold Lovenox/chemical DVT prophylaxis in setting of possible mopped assist, SCDs only, ambulation as tolerated     Diet - Regular diet     Code Status / HCDM  - DNI/DNR, Discussed with patient at the time of rounds 11/06/2022  -  HCDM (patient stated preference): [name] - Domestic Partner - [phone]    _________________________    Subjective:   Stable overnight. Continued to have dark brown sputum in small amounts, color now darker. No additional red sputum. Maintaining solid appetite but not great. Good UOP. No increased work of breathing or shortness of breath.    Objective:   Vital signs in last 24 hours:  Temp:  [36.6 ??C (97.8 ??F)-37.1 ??C (98.8 ??F)] 36.7 ??C (98.1 ??F)  Heart Rate:  [74-97] 82  SpO2 Pulse:  [74-99] 84  Resp:  [19-43] 25  BP: (89-144)/(59-86) 108/60  MAP (mmHg):  [66-101] 76  SpO2:  [88 %-100 %] 89 %    Intake/Output last 3 shifts:  I/O last 3 completed shifts:  In: 2760.3 [P.O.:1560; I.V.:30.3; IV Piggyback:1170]  Out: 2700 [Urine:2700]  Intake/Output this shift:  I/O this shift:  In: 100 [IV Piggyback:100]  Out: 1000 [Urine:1000]    Physical Exam:  General: Comfortable, nontoxic and no acute distress  ENT: MMM  CV: RRR, Normal S1/S2, no m/r/g, distal pulses 2+, no JVD, no hepatojugular reflex  Pulm: Decreased aeration on the left. No wheezing, rhonchi, or crackles. No increased work of breathing  Abd: NABS, soft, nontend  Extremities - Trace pitting lower extremity edema to the shins bilaterally. No clubbing. No cyanosis  Psych: engaged, appropriate mood and affect    I attest that I have reviewed the student note and that the components of the history of the present illness, the physical exam, and the assessment and plan documented were performed by me or were performed in my presence by the student where I verified the documentation and performed (or re-performed) the exam and medical decision making.   Erlinda Hong, MD    I spent >50% of a total 40 min in direct counseling and coordination of care for this patient.

## 2022-11-11 LAB — BASIC METABOLIC PANEL
BLOOD UREA NITROGEN: 16 mg/dL (ref 9–23)
BUN / CREAT RATIO: 13
CALCIUM: 10 mg/dL (ref 8.7–10.4)
CHLORIDE: 99 mmol/L (ref 98–107)
CO2: >40 mmol/L (ref 20.0–31.0)
CREATININE: 1.22 mg/dL — ABNORMAL HIGH
EGFR CKD-EPI (2021) FEMALE: 51 mL/min/{1.73_m2} — ABNORMAL LOW (ref >=60)
GLUCOSE RANDOM: 96 mg/dL (ref 70–179)
POTASSIUM: 4.4 mmol/L (ref 3.4–4.8)
SODIUM: 141 mmol/L (ref 135–145)

## 2022-11-11 LAB — CBC
HEMATOCRIT: 32.6 % — ABNORMAL LOW (ref 34.0–44.0)
HEMOGLOBIN: 10.6 g/dL — ABNORMAL LOW (ref 11.3–14.9)
MEAN CORPUSCULAR HEMOGLOBIN CONC: 32.5 g/dL (ref 32.0–36.0)
MEAN CORPUSCULAR HEMOGLOBIN: 26.3 pg (ref 25.9–32.4)
MEAN CORPUSCULAR VOLUME: 81 fL (ref 77.6–95.7)
MEAN PLATELET VOLUME: 8.3 fL (ref 6.8–10.7)
PLATELET COUNT: 260 10*9/L (ref 150–450)
RED BLOOD CELL COUNT: 4.03 10*12/L (ref 3.95–5.13)
RED CELL DISTRIBUTION WIDTH: 15.5 % — ABNORMAL HIGH (ref 12.2–15.2)
WBC ADJUSTED: 7.7 10*9/L (ref 3.6–11.2)

## 2022-11-11 MED ORDER — ARFORMOTEROL 15 MCG/2 ML SOLUTION FOR NEBULIZATION
Freq: Two times a day (BID) | RESPIRATORY_TRACT | 2 refills | 30 days | Status: CP
Start: 2022-11-11 — End: ?
  Filled 2022-11-12: qty 120, 30d supply, fill #0

## 2022-11-11 MED ORDER — BUDESONIDE 0.5 MG/2 ML SUSPENSION FOR NEBULIZATION
Freq: Two times a day (BID) | RESPIRATORY_TRACT | 2 refills | 30 days | Status: CP
Start: 2022-11-11 — End: ?
  Filled 2022-11-12: qty 120, 30d supply, fill #0

## 2022-11-11 MED ORDER — FUROSEMIDE 80 MG TABLET
ORAL_TABLET | Freq: Two times a day (BID) | ORAL | 0 refills | 30 days | Status: CP
Start: 2022-11-11 — End: 2022-12-11
  Filled 2022-11-12: qty 60, 20d supply, fill #0

## 2022-11-11 MED ORDER — SODIUM CHLORIDE 3 % FOR NEBULIZATION
Freq: Two times a day (BID) | RESPIRATORY_TRACT | 2 refills | 30 days | Status: CP
Start: 2022-11-11 — End: ?

## 2022-11-11 MED ORDER — LEVOFLOXACIN 500 MG TABLET
ORAL_TABLET | ORAL | 0 refills | 8 days | Status: CP
Start: 2022-11-11 — End: ?
  Filled 2022-11-12: qty 8, 8d supply, fill #0

## 2022-11-11 MED ADMIN — insulin lispro (HumaLOG) injection 0-20 Units: 0-20 [IU] | SUBCUTANEOUS | @ 16:00:00

## 2022-11-11 MED ADMIN — furosemide (LASIX) tablet 80 mg: 80 mg | ORAL | @ 09:00:00 | Stop: 2022-11-11

## 2022-11-11 MED ADMIN — budesonide (PULMICORT) nebulizer solution 0.5 mg: .5 mg | RESPIRATORY_TRACT | @ 01:00:00

## 2022-11-11 MED ADMIN — arformoterol (BROVANA) nebulizer solution 15 mcg/2 mL: 15 ug | RESPIRATORY_TRACT | @ 12:00:00

## 2022-11-11 MED ADMIN — cefepime (MAXIPIME) 2 g in sodium chloride 0.9 % (NS) 100 mL IVPB-MBP: 2 g | INTRAVENOUS | @ 09:00:00 | Stop: 2022-11-11

## 2022-11-11 MED ADMIN — ipratropium-albuterol (DUO-NEB) 0.5-2.5 mg/3 mL nebulizer solution 3 mL: 3 mL | RESPIRATORY_TRACT | @ 12:00:00

## 2022-11-11 MED ADMIN — sodium chloride 3 % NEBULIZER solution 4 mL: 4 mL | RESPIRATORY_TRACT | @ 16:00:00

## 2022-11-11 MED ADMIN — gabapentin (NEURONTIN) capsule 200 mg: 200 mg | ORAL | @ 18:00:00

## 2022-11-11 MED ADMIN — sodium chloride 3 % NEBULIZER solution 4 mL: 4 mL | RESPIRATORY_TRACT | @ 20:00:00

## 2022-11-11 MED ADMIN — posaconazole (NOXAFIL) delayed released tablet 200 mg: 200 mg | ORAL | @ 13:00:00

## 2022-11-11 MED ADMIN — sodium chloride 3 % NEBULIZER solution 4 mL: 4 mL | RESPIRATORY_TRACT

## 2022-11-11 MED ADMIN — cefepime (MAXIPIME) 2 g in sodium chloride 0.9 % (NS) 100 mL IVPB-MBP: 2 g | INTRAVENOUS | @ 18:00:00 | Stop: 2022-11-11

## 2022-11-11 MED ADMIN — gabapentin (NEURONTIN) capsule 200 mg: 200 mg | ORAL | @ 01:00:00

## 2022-11-11 MED ADMIN — budesonide (PULMICORT) nebulizer solution 0.5 mg: .5 mg | RESPIRATORY_TRACT | @ 11:00:00

## 2022-11-11 MED ADMIN — ipratropium-albuterol (DUO-NEB) 0.5-2.5 mg/3 mL nebulizer solution 3 mL: 3 mL | RESPIRATORY_TRACT | @ 16:00:00

## 2022-11-11 MED ADMIN — acetaminophen (TYLENOL) tablet 650 mg: 650 mg | ORAL | @ 10:00:00

## 2022-11-11 MED ADMIN — furosemide (LASIX) tablet 80 mg: 80 mg | ORAL | @ 18:00:00 | Stop: 2022-11-11

## 2022-11-11 MED ADMIN — sildenafiL (pulm.hypertension) (REVATIO) tablet 40 mg: 40 mg | ORAL | @ 13:00:00

## 2022-11-11 MED ADMIN — cefepime (MAXIPIME) 2 g in sodium chloride 0.9 % (NS) 100 mL IVPB-MBP: 2 g | INTRAVENOUS | @ 01:00:00 | Stop: 2022-11-17

## 2022-11-11 MED ADMIN — insulin lispro (HumaLOG) injection 0-20 Units: 0-20 [IU] | SUBCUTANEOUS | @ 01:00:00

## 2022-11-11 MED ADMIN — sildenafiL (pulm.hypertension) (REVATIO) tablet 40 mg: 40 mg | ORAL | @ 01:00:00

## 2022-11-11 MED ADMIN — azithromycin (ZITHROMAX) tablet 250 mg: 250 mg | ORAL | @ 13:00:00 | Stop: 2022-11-11

## 2022-11-11 MED ADMIN — insulin glargine (LANTUS) injection 15 Units: 15 [IU] | SUBCUTANEOUS | @ 01:00:00

## 2022-11-11 MED ADMIN — sildenafiL (pulm.hypertension) (REVATIO) tablet 40 mg: 40 mg | ORAL | @ 18:00:00

## 2022-11-11 MED ADMIN — gabapentin (NEURONTIN) capsule 200 mg: 200 mg | ORAL | @ 13:00:00

## 2022-11-11 MED ADMIN — arformoterol (BROVANA) nebulizer solution 15 mcg/2 mL: 15 ug | RESPIRATORY_TRACT | @ 01:00:00

## 2022-11-11 MED ADMIN — sodium chloride 3 % NEBULIZER solution 4 mL: 4 mL | RESPIRATORY_TRACT | @ 12:00:00

## 2022-11-11 MED ADMIN — ipratropium-albuterol (DUO-NEB) 0.5-2.5 mg/3 mL nebulizer solution 3 mL: 3 mL | RESPIRATORY_TRACT

## 2022-11-11 MED ADMIN — ipratropium-albuterol (DUO-NEB) 0.5-2.5 mg/3 mL nebulizer solution 3 mL: 3 mL | RESPIRATORY_TRACT | @ 20:00:00

## 2022-11-11 NOTE — Unmapped (Signed)
Pt remains SD status. Still desatting intermittently to the low 80s when active, VSS otherwise. Satting >88% on 6-8L via oxymizer. Putting out adequate urine output with Purewick. States she's hopeful she'll go home today. Plan of care to continue.   Problem: Adult Inpatient Plan of Care  Goal: Plan of Care Review  Outcome: Progressing  Flowsheets (Taken 11/11/2022 0452)  Progress: improving  Plan of Care Reviewed With: patient  Goal: Patient-Specific Goal (Individualized)  Outcome: Progressing  Goal: Absence of Hospital-Acquired Illness or Injury  Outcome: Progressing  Intervention: Identify and Manage Fall Risk  Recent Flowsheet Documentation  Taken 11/10/2022 2000 by Gardiner Sleeper, RN  Safety Interventions:   fall reduction program maintained   low bed   lighting adjusted for tasks/safety   aspiration precautions   infection management   nonskid shoes/slippers when out of bed  Intervention: Prevent Skin Injury  Recent Flowsheet Documentation  Taken 11/11/2022 0400 by Gardiner Sleeper, RN  Positioning for Skin: Supine/Back  Taken 11/11/2022 0200 by Gardiner Sleeper, RN  Positioning for Skin: Right  Taken 11/11/2022 0000 by Gardiner Sleeper, RN  Positioning for Skin: Supine/Back  Taken 11/10/2022 2200 by Gardiner Sleeper, RN  Positioning for Skin: Left  Taken 11/10/2022 2000 by Gardiner Sleeper, RN  Positioning for Skin: Bed in Chair  Device Skin Pressure Protection:   positioning supports utilized   pressure points protected   tubing/devices free from skin contact  Skin Protection:   adhesive use limited   tubing/devices free from skin contact   incontinence pads utilized  Intervention: Prevent and Manage VTE (Venous Thromboembolism) Risk  Recent Flowsheet Documentation  Taken 11/11/2022 0400 by Gardiner Sleeper, RN  VTE Prevention/Management:   fluids promoted   ambulation promoted  Anti-Embolism Device Type: SCD, Knee  Anti-Embolism Intervention: On  Anti-Embolism Device Location: BLE  Taken 11/11/2022 0200 by Gardiner Sleeper, RN  Anti-Embolism Device Type: SCD, Knee  Anti-Embolism Intervention: On  Anti-Embolism Device Location: BLE  Taken 11/11/2022 0000 by Gardiner Sleeper, RN  VTE Prevention/Management:   fluids promoted   ambulation promoted  Anti-Embolism Device Type: SCD, Knee  Anti-Embolism Intervention: On  Anti-Embolism Device Location: BLE  Taken 11/10/2022 2200 by Gardiner Sleeper, RN  Anti-Embolism Device Type: SCD, Knee  Anti-Embolism Intervention: On  Anti-Embolism Device Location: BLE  Taken 11/10/2022 2000 by Gardiner Sleeper, RN  VTE Prevention/Management:   fluids promoted   ambulation promoted  Anti-Embolism Device Type: SCD, Knee  Anti-Embolism Intervention: On  Anti-Embolism Device Location: BLE  Intervention: Prevent Infection  Recent Flowsheet Documentation  Taken 11/10/2022 2000 by Gardiner Sleeper, RN  Infection Prevention:   hand hygiene promoted   rest/sleep promoted   single patient room provided   equipment surfaces disinfected  Goal: Optimal Comfort and Wellbeing  Outcome: Progressing  Goal: Readiness for Transition of Care  Outcome: Progressing  Goal: Rounds/Family Conference  Outcome: Progressing

## 2022-11-11 NOTE — Unmapped (Signed)
Patient is a 57 y.o. female with chronic hypoxemic and hypercarbic respiratory failure secondary to pulmonary sarcoidosis with bronchiectasis and aspergilloma, severe persistent asthma, pulmonary arterial hypertension with cor pulmonale. States feeling a little better, on 8L [place] now.    Problem: Adult Inpatient Plan of Care  Goal: Plan of Care Review  Outcome: Ongoing - Unchanged  Goal: Patient-Specific Goal (Individualized)  Outcome: Ongoing - Unchanged  Goal: Absence of Hospital-Acquired Illness or Injury  Outcome: Ongoing - Unchanged  Intervention: Prevent Skin Injury  Recent Flowsheet Documentation  Taken 11/11/2022 1600 by Rise Patience, RN  Positioning for Skin: Left  Taken 11/11/2022 1400 by Rise Patience, RN  Positioning for Skin: Right  Taken 11/11/2022 1200 by Rise Patience, RN  Positioning for Skin: Left  Taken 11/11/2022 1000 by Rise Patience, RN  Positioning for Skin: Right  Taken 11/11/2022 0800 by Rise Patience, RN  Positioning for Skin: Left  Intervention: Prevent and Manage VTE (Venous Thromboembolism) Risk  Recent Flowsheet Documentation  Taken 11/11/2022 1600 by Rise Patience, RN  Anti-Embolism Device Type: SCD, Knee  Anti-Embolism Intervention: On  Anti-Embolism Device Location: BLE  Taken 11/11/2022 1400 by Rise Patience, RN  Anti-Embolism Device Type: SCD, Knee  Anti-Embolism Intervention: On  Anti-Embolism Device Location: BLE  Taken 11/11/2022 1200 by Rise Patience, RN  Anti-Embolism Device Type: SCD, Knee  Anti-Embolism Intervention: On  Anti-Embolism Device Location: BLE  Taken 11/11/2022 1000 by Rise Patience, RN  Anti-Embolism Device Type: SCD, Knee  Anti-Embolism Intervention: On  Anti-Embolism Device Location: BLE  Taken 11/11/2022 0800 by Rise Patience, RN  Anti-Embolism Device Type: SCD, Knee  Anti-Embolism Intervention: On  Anti-Embolism Device Location: BLE  Goal: Optimal Comfort and Wellbeing  Outcome: Ongoing - Unchanged  Goal: Readiness for Transition of Care  Outcome: Ongoing - Unchanged  Goal: Rounds/Family Conference  Outcome: Ongoing - Unchanged     Problem: Infection  Goal: Absence of Infection Signs and Symptoms  Outcome: Ongoing - Unchanged     Problem: Self-Care Deficit  Goal: Improved Ability to Complete Activities of Daily Living  Outcome: Ongoing - Unchanged     Problem: Fall Injury Risk  Goal: Absence of Fall and Fall-Related Injury  Outcome: Ongoing - Unchanged     Problem: Comorbidity Management  Goal: Maintenance of Asthma Control  Outcome: Ongoing - Unchanged  Goal: Maintenance of COPD Symptom Control  Outcome: Ongoing - Unchanged  Goal: Blood Glucose Levels Within Targeted Range  Outcome: Ongoing - Unchanged  Goal: Blood Pressure in Desired Range  Outcome: Ongoing - Unchanged

## 2022-11-11 NOTE — Unmapped (Signed)
Daily Progress Note    Assessment/Plan:   Active Problems:    Pulmonary sarcoidosis (CMS-HCC)    HTN (hypertension)    Type 2 diabetes mellitus with hyperglycemia (CMS-HCC)    Acute on chronic hypoxic respiratory failure (CMS-HCC)    Chronic pulmonary aspergillosis (CMS-HCC)    Severe persistent asthma dependent on systemic steroids    Pulmonary hypertension (CMS-HCC)       LOS: 6 days     Karen Barber is a 57 y.o. female who is presenting to [facility] with acute on chronic hypoxemic respiratory failure in the setting of pneumonia complicated by history of bronchiectasis, reactive airway disease, and aspergillosis on posaconazole. Patient's hospital course has been complicated by fluid overload in the setting of cor pulmonale. Approaching readiness for discharge but with slightly increased edema and weight today, will keep overnight and reassess for discharge tomorrow.     Acute on chronic hypoxemic respiratory failure  Acute hypercapnic respiratory failure  Pneumonia from bronchiectasis and RAD exacerbation  Patient continues to be afebrile. Currently hemodynamically stable. Patient is continuing oxygen therapy of AVAPS overnight and large bore [place] during the day, both of which she is comfortable with. Home AVAPS machine arriving 8/22 (today). Dypsnea and sputum is continuing to improve. Sputum now darker brown than before. Blood culture showing no growth at 72 hours. Negative RPP and MRSA PCR and screen. AFB smear and culture negative. Sputum culture showing 1+ Gram negative rods, resulted in Serratia marcescens, S to ceftriax, Fqs, bactrim and others. Will continue cefepime for now and transition tomorrow to levoflox for a 14 day course.     Plan:  Pulmonology following, recommendations appreciated:  - Oxygen support with AVAPS at night and large bore [place] during the day, wean as tolerated.   - Airway clearance with nebulized normal saline mixed with 3% saline and acapella.  - RAD nebulizer therapy with Pulmicort, Brovana, and DuoNebs  - Continue azithromycin, hold while on levoflox due to QT prolongation  - s/p 1x Rocephin (8/18), Vancomycin (8/18-8/19), Zosyn (8/18-8/23)    Dispo Plan:  - VIR consulted, outpatient evaluation for BAE scheduled 10/25 with Dr. Duanne Limerick  - Outpatient double contrast barium swallow to evaluate esophagus given appearance on CT, location of pneumonia, and sore throat   - Changed supplemental oxygen company from [place] to [place]  - Have patient call [employer] to discontinue services once she receives all materials from [place]  - Discharge home with Oxymizer device, new nebulizer, normal saline nebulizer solution, Aerobika, and Anoro daily.     Cor Pulmonale  Pulmonary hypertension  RV Failure  Maintaining good UOP and did well on limiting fluid intake unless needed. Now euvolemic as edema is improved, BNP normalized. Volume status improved, putting out well to lasix 80mg  BID but weight increasing and slightly increased edema today.   - Lasix 80mg  PO BID (home dose was 40mg  BID but was not having good response), will give a 3rd dose today  - Daily AM BMP for K/creat monitoring  - Trend BNP every 2 to 3 days (next: 8/24)  - Daily standing weights  - Strict I&Os  - Sildenafil 40 mg TID for pulmonary hypertension    CKD stage III   GFR currently likely approx 30 based on cystatin C  Baseline creatinine appears to be around 1.2-1.3. BUN and creatinine now down trending after decrease in Lasix frequency. Continue to monitor.  - Renally dose medications, avoid nephrotoxic agents  - Continue to monitor with BMP  Type 2 diabetes mellitus with hyperglycemia   HgbA1c 8.3% July 2024, Home regimen of glargine 18 units nightly, sliding scale insulin. Will continue to monitor.   - Glargine 18 units decreased to 15units at night.   - Additional sliding scale insulin ACHS    Anemia - mixed  Likely with iron deficiency anemia (iron sat <10%) in addition to some combination of anemia of CKD and anemia of chronic disease. Iron deficiency anemia is associated with pulmonary hypertension in multiple studies. S/p iron supplementation with IV iron 8/22 to replete 1.5 gram deficit.     Chronic pulmonary aspergillosis  AFB smear and culture negative  - Continue home posaconazole 200 mg daily  - EKG on admission obtained with normal Qtc of 411 ms, repeat on 8/20 show normal Qtc of 425 ms.     HTN  - Hold home amlodipine indefinitely - BP well controlled and may have contributed to lower extremity edema    PPX. Hold Lovenox/chemical DVT prophylaxis in setting of possible mopped assist, SCDs only, ambulation as tolerated     Diet - Regular diet     Code Status / HCDM  - DNI/DNR, Discussed with patient at the time of rounds 11/06/2022  -  HCDM (patient stated preference): [name] - Domestic Partner - [phone]    _________________________    Subjective:   Stable overnight. Feels the same overall but noted some slight swelling in her legs in addition to weight up significantly. Discussed discharge but she's very worried she'll end up coming right back since swelling increasing. Giving a 3rd dose of PO lasix and will monitor overnight.    Objective:   Vital signs in last 24 hours:  Temp:  [36.5 ??C (97.7 ??F)-37 ??C (98.6 ??F)] 36.8 ??C (98.2 ??F)  Heart Rate:  [77-93] 85  SpO2 Pulse:  [80-93] 88  Resp:  [17-46] 23  BP: (91-134)/(62-79) 91/62  MAP (mmHg):  [68-96] 68  SpO2:  [92 %-99 %] 95 %    Intake/Output last 3 shifts:  I/O last 3 completed shifts:  In: 3690 [P.O.:2520; IV Piggyback:1170]  Out: 4600 [Urine:4600]  Intake/Output this shift:  I/O this shift:  In: 480 [P.O.:480]  Out: 200 [Urine:200]    Physical Exam:  General: Comfortable, nontoxic and no acute distress  ENT: MMM  CV: RRR, Normal S1/S2, no m/r/g, distal pulses 2+, no JVD, no hepatojugular reflex  Pulm: Decreased aeration on the left. No wheezing, rhonchi, or crackles. No increased work of breathing  Abd: NABS, soft, nontend  Extremities - Trace pitting lower extremity edema to the shins bilaterally. No clubbing. No cyanosis  Psych: engaged, appropriate mood and affect    I spent >50% of a total 50 min in direct counseling and coordination of care for this patient.

## 2022-11-11 NOTE — Unmapped (Signed)
ULTRASOUND PIV PROCEDURE NOTE    Indications:   Poor venous access.    Ultrasound guidance was necessary to obtain access.     Procedure Details:  Identity of the patient was confirmed via name, medical record number and date of birth. The availability of the correct equipment was verified.    The vein was identified and measured for ultrasound catheter insertion.       Vein measurement (without tourniquet):   .34 cm   A(n) 22 gauge 1.75 catheter was selected based on the recommendations below:    [facility] Catheter/Vein Ratio Guidelines    Chart to determine PIV catheter size/length to use based on vein diameter and depth   Catheter Gauge Size (g)  22g 20g 18g   Catheter length (inches)  1.75 1.75 1.75   Catheter diameter measurement (mm) 0.9 mm 1.1 mm 1.3 mm          Minimum required vein diameter       Sonosite (cm)  0.27 cm 0.33 cm 0.39 cm          Maximum vein depth  1.25 cm 1.25 cm 1.25 cm          The field was prepared with necessary supplies and equipment.  Probe cover and sterile gel utilized. Insertion site was prepped with chlorhexidineand allowed to dry.  The catheter extension was primed with normal saline.  The [place] PIV was placed in the L Forearm with 1attempt(s). See LDA for additional details.    Catheter aspirated, 3 mL blood return present. The catheter was then flushed with 10 mL of normal saline. Insertion site cleansed, and dressing applied per manufacturer guidelines. The catheter was inserted with difficulty due to poor vasculature by Lonia Mad, RN.    Thank you,     Lonia Mad, RN    Ultrasound Resource Nurse    Workup / Procedure Time:  30 minutes

## 2022-11-11 NOTE — Unmapped (Signed)
General Pulmonary Team Follow Up Consult Note     Date of Service: 11/11/2022  Requesting Physician: Erlinda Hong, MD   Requesting Service: Med Undesignated (MDX)  Reason for consultation: Comprehensive evaluation of acute on chronic hypoxemic and hypercarbic respiratory failure secondary to likely pneumonia superimposed on pulmonary sarcoidosis with bronchiectasis and aspergilloma, severe persistent asthma, pulmonary arterial hypertension with cor pulmonale     Hospital Problems:  Active Problems:    Pulmonary sarcoidosis (CMS-HCC)    HTN (hypertension)    Type 2 diabetes mellitus with hyperglycemia (CMS-HCC)    Acute on chronic hypoxic respiratory failure (CMS-HCC)    Chronic pulmonary aspergillosis (CMS-HCC)    Severe persistent asthma dependent on systemic steroids    Pulmonary hypertension (CMS-HCC)      HPI: Karen Barber is a 57 y.o. female with chronic hypoxemic and hypercarbic respiratory failure secondary to pulmonary sarcoidosis with bronchiectasis and aspergilloma, severe persistent asthma, pulmonary arterial hypertension with cor pulmonale.  She was recently hospitalized for acute on chronic hypoxemic and hypercarbic respiratory failure in the setting of hemoptysis.  She was diuresed and treated with antibiotics.  She saw Dr. Audrea Muscat in the pulmonary clinic 5 days ago, reporting that she felt to be at 90% of her baseline.     She presented today due to at least 48 hours of worsening shortness of breath and increased oxygen requirement.  She was using 6 L of oxygen at baseline but was needing to increase it to 8-10L in order to maintain her oxygen saturations.  Otherwise, she was desaturating to the 70s and 80s.  Over the past couple of days, she has been coughing up brown sputum with streaks, flecks, and small amounts of blood, not increased from prior.  She has found it difficult to cough up sputum in the absence of her airway clearance therapies.  She also noticed increased swelling of her lower extremities despite continuing to have a good diuretic response.  She typically sleeps propped up and had to increase her pillows further.  No nausea until she received IV fluid bolus in the ER.  She had subjective fevers at home but did not feel well enough to go and check her temperature.  Endorses soreness from coughing over her chest, left greater than right.  No GERD, dysphagia, regurgitation, or aspiration.     In the emergency room, she was given a 1 L fluid bolus over 15 minutes per sepsis protocol.  She was also given azithromycin, vancomycin and Zosyn.  Given Solu-Medrol 125 mg.  Upon arrival to the CCU, she required 11 to 12 L of supplemental oxygen after being moved to the bed.     Her prior lower respiratory cultures have primarily yielded oropharyngeal flora and more distantly Aspergillus [place].  Isolated Enterococcus faecium and Achromobacter species in July 2018.  Culture from August 2016 also had Achromobacter.  Isolated stenotrophomonas maltophilia from lung tissue in September 2016.  Her AFB cultures have also been negative with the exception of Mycobacterium fortuitum which is not typically pathogenic but can be seen in the setting of recurrent aspiration.  Has never isolated MRSA including from skin/soft tissue.    Problems addressed during this consult include acute on chronic respiratory failure, eosinophilic asthma, PAH with cor pulmonale, chronic bronchiectasis, sarcoidosis, recurrent hemoptysis. Based on these problems, the patient has high risk of morbidity/mortality which is commensurate w their risk of management options described below in the recommendations.    Assessment      Interval  Events:  Ongoing difficulty with AVAPS mask in the immediate time period after airway clearance nebs due to coughing and mucous clearance. Continues to cough up brown sputum.         Impression: The patient has made significant clinical progress since last encounter. I personally reviewed most recent pertinent labs, imaging and micro data, from 11/11/2022, which are noted in recommendations.        Recommendations     Acute on chronic hypoxic and hypercapnic respiratory failure secondary to pneumonia superimposed on bronchiectasis and fibrosis 2/2 sarcoidosis; cor pulmonale.   Noninvasive mechanical ventilation (AVAPS) for severe chronic respiratory failure 2/2 restrictive lung disease secondary to sarcoidosis related bronchiectasis and fibrosis with an FVC less than 50% predicted.  Okay to use home machine while in hospital.  Supplemental oxygen:  Goal O2 saturation 90-95% via oxymizer.  Supplemental oxygen delivery changed to [employer]  Continue airway clearance:  Trial of mixture of duonebs and hypertonic saline 3% (creates about 1.5% hypertonic saline) 4 times daily with Aerobika in line.   Her tolerance of above will determine whether to send script for normal saline (0.9%) vs hypertonic saline 3% to [facility].  [employer] providing Pari Proneb Max as this is a heavy-duty nebulizer that is capable of providing multiple treatments on a twice daily basis. Although received nebulizer from [facility] in 2022, it was for Tyvaso and cannot be used for other medications. Nebulizer to be delivered to room today.  RT provided her an [place] and showed her how to attach it to use in line with the nebulizer.  Continue nebulized therapy of arformoterol and budesonide.  She favors continuing these as an outpatient rather than inhalers.  Will need scripts.    Severe pulmonary hypertension with RV failure: Weight increased but does not appear volume up on exam.  Diuresis:  Agree with continuing oral Lasix 80 mg twice daily.    Once off IV antibiotics, will need to see if still needs this dose.  Daily standing weights.  Strict I's and O's.   Trend BNP every 2 to 3 days.  Continue sildenafil 40 mg tid.  She will need a new prescription for this higher dose of sildenafil.  Provide her written instructions regarding weighing herself at home and when to reach out about weight gain.    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  Serratia PNA:   Continue cefepime 2 gm IV q8 for Serratia coverage.  Await sensitivity results.  If sensitive to FQ, would repeat EKG before discharge while on posaconazole, azithromycin, and FQ.  Although has used Bactrim before, I have concerns given renal disease.  Due to her renal disease, it is not ideal to use a PICC line.  Potentially could be ACH candidate to continue peripheral IVs for antibiotics.  Continue azithromycin 250 mg daily for anti-inflammatory effect as long as her QTc is within safe limits.  She will need a prescription for this.  Continue home posaconazole  Outpatient double contrast barium swallow to evaluate esophagus given appearance on CT, location of pneumonia, and sore throat.    Hx of hemoptysis: No evidence of hemoptysis during hospitalization  Continue close monitoring for hemoptysis.   Outpatient VIR consultation for potential elective bronchial artery embolization.  Her March 2024 CT angio from [facility] shows visible but not significantly enlarged bronchial arteries.  Also has enlarged intercostal arteries and significant bulky mediastinal lymphadenopathy surrounding the bronchial arteries.  I reviewed the imaging with Dr. Adalberto Cole in chest radiology.  Unclear utility of embolizing intercostal arteries.    Dispo:  I reached out to our schedules to try to move up her appts with Dr. Audrea Muscat and Dr. Ala Dach following this hospitalization and treatment plan changes.  Please provide in her discharge summary written instructions regarding order of nebulizer treatments as detailed below. Please note that you may need to update them to indicate that she should should use the normal saline after the Duonebs (if she doesn't tolerate the 1.5% concentration).    Order of Treatments (twice a day)  Bronchodilator (Albuterol/Ipratropium): Relaxes the large smooth muscle and opens up the lungs. Mix with hypertonic saline 3%.  Hypertonic Saline 3% (mixed with Duonebs): Hydrates the lungs and helps to thin the mucus to make it easier to cough up. Can be done with airway clearance.  Airway Clearance Waymon Budge): Helps to mobilize the mucus to make it easier to cough up. Perform with steps 1 & 2.  Inhaled Corticosteroids and Long Acting Bronchodilator (Budesonide and Arformoterol): Maintenance medications; take daily even if you feel fine. To be done after lungs are open and cleaned out.     The recommendations outlined in this note were discussed w the primary team via epic chat. Please do not hesitate to page [phone] [facility] consult) with questions. We appreciate the opportunity to assist in the care of this patient. We look forward to following with you.    Attestation      I directly provided consultative services as documented in this note and I personally spent 40 minutes face-to-face and non face-to-face in the care of this patient, which includes all pre, intra and post visit time on the date of service including examining the patient, evaluating/interpreting objective clinical data, coordinating care with the entire multidisciplinary care team and developing a comprehensive management plan as outlined above. All documented time was specific to the E/M visit and does not include any procedures that may have been performed.   This includes time speaking with [employer] representative about her nebulizer and updating Dr. Audrea Muscat on plan/hospital course.    Guido Sander, MD    Subjective & Objective     Vitals - past 24 hours  Temp:  [36.5 ??C (97.7 ??F)-36.9 ??C (98.4 ??F)] 36.7 ??C (98 ??F)  Heart Rate:  [77-93] 77  SpO2 Pulse:  [80-93] 82  Resp:  [19-46] 30  BP: (115-139)/(64-80) 118/64  SpO2:  [89 %-99 %] 99 % Intake/Output  I/O last 3 completed shifts:  In: 3690 [P.O.:2520; IV Piggyback:1170]  Out: 4600 [Urine:4600]      Pertinent exam findings:   General appearance - well appearing, well nourished, and in no distress, appears comfortable and in good spirits  Eyes - EOMI, PERRLA, anicteric sclerae, pink conjunctiva  Mouth - moist mucous membranes,  Neck - Trachea midline, normal neck movement  Lymphatics - not examined  Heart - regular rate and rhythm. No murmur. Sharp P2 but able to split heart sounds. No JVD appreciated.   Chest - equal expansion, coarse breath sounds with increased coarse crackles over left mid and right lower lobe posteriorly. Decreased breath sounds over left base. Wet productive cough.  Improved air entry.  No wheezing or rhonchi.  Easy work of breathing without accessory muscle use.   Abdomen - soft, nontender, nondistended, no masses or organomegaly  Extremities - No lower extremity edema. Mild clubbing.  Unable to appreciate cyanosis due to artificial nails   Skin -  normal coloration and turgor, no rashes, no suspicious skin lesions noted  Neurological - alert, oriented, normal speech, no focal findings or movement disorder noted    Relevant Imaging:  - CXR on 11/05/2022 revealed stable findings of pulmonary sarcoidosis including fibrosis and bullous disease with superimposed hazy/patchy consolidation in the left mid lung zone.  - CT chest with contrast on 10/21/22 revealed no pulmonary emboli or evidence of pulmonary hemorrhage.  Unchanged fibrotic pulmonary sarcoidosis with biapical upper lobe cystic bronchiectasis and scattered segmental and subsegmental fibrosis with bronchiectasis.  Unchanged bulky eggshell calcified mediastinal and hilar lymphadenopathy compatible with pulmonary sarcoidosis.  - ECHO on 10/23/2022 revealed normal EF of greater than 55% but severe pulmonary hypertension with severe dilation of the right ventricle and mildly reduced systolic function.     Arterial Blood Gas:   No results for input(s): SPECTYPEART, PHART, PCO2ART, PO2ART, HCO3ART, BEART, O2SATART in the last 24 hours.     Venous Blood Gas:   No results for input(s): PHVEN, PCO2VEN, PO2VEN, HCO3VEN, BEVEN, O2SATVEN in the last 24 hours.     Cultures:  Blood Culture, Routine (no units)   Date Value   11/05/2022 No Growth at 5 days   11/05/2022 No Growth at 5 days     Lower Respiratory Culture (no units)   Date Value   11/08/2022 3+ Serratia marcescens (A)   11/08/2022 1+ Oropharyngeal Flora Isolated     WBC (10*9/L)   Date Value   11/11/2022 7.7          Other Labs:  Lab Results   Component Value Date    WBC 7.7 11/11/2022    HGB 10.6 (L) 11/11/2022    HCT 32.6 (L) 11/11/2022    PLT 260 11/11/2022     Lab Results   Component Value Date    NA 141 11/11/2022    K 4.4 11/11/2022    CL 99 11/11/2022    CO2 >40.0 (HH) 11/11/2022    BUN 16 11/11/2022    CREATININE 1.22 (H) 11/11/2022    GLU 96 11/11/2022    CALCIUM 10.0 11/11/2022    MG 1.7 11/08/2022    PHOS 2.7 11/05/2022     Lab Results   Component Value Date    BILITOT 1.1 11/05/2022    BILIDIR <0.10 05/30/2019    PROT 7.2 11/05/2022    ALBUMIN 3.3 (L) 11/05/2022    ALT <7 (L) 11/05/2022    AST 9 11/05/2022    ALKPHOS 84 11/05/2022    GGT 60 (H) 05/31/2011     Lab Results   Component Value Date    INR 1.02 12/11/2019    APTT 30.9 12/11/2019     Last FVL: FVC 1.89 L (48%), FEV1 1.31 L (43%), FEV1/FVC 69%.    Allergies & Home Medications   Personally reviewed in Epic    Continuous Infusions:         Scheduled Medications:    arformoterol  15 mcg Nebulization BID (RT)    azithromycin  250 mg Oral Q24H SCH    budesonide (PULMICORT) nebulizer solution  0.5 mg Nebulization BID (RT)    cefepime  2 g Intravenous Q8H    furosemide  80 mg Oral BID    gabapentin  200 mg Oral TID    insulin glargine  15 Units Subcutaneous Nightly    insulin lispro  0-20 Units Subcutaneous ACHS    ipratropium-albuterol  3 mL Nebulization 4x Daily (RT)    And    sodium  chloride  4 mL Nebulization 4x Daily (RT)    posaconazole  200 mg Oral Daily    sildenafiL (pulm.hypertension)  40 mg Oral TID       PRN medications:  acetaminophen, albuterol, dextrose in water, glucagon, glucose, guaiFENesin, melatonin, cough/sore throat lozenge, [EXPIRED] Implement **AND** [EXPIRED] Care order/instruction **AND** [EXPIRED] Vital signs **AND** Adult Oxygen therapy **AND** [EXPIRED] sodium chloride **AND** [EXPIRED] sodium chloride 0.9% **AND** [EXPIRED] diphenhydrAMINE **AND** [EXPIRED] famotidine **AND** methylPREDNISolone sodium succinate **AND** [EXPIRED] EPINEPHrine, ondansetron, polyethylene glycol, senna

## 2022-11-11 NOTE — Unmapped (Signed)
Patient remains on 7 lpm nasal canula. No acute resp. Distress noted. Scheduled breathing treatments given with [place]

## 2022-11-11 NOTE — Unmapped (Signed)
Problem: Adult Inpatient Plan of Care  Goal: Plan of Care Review  Outcome: Progressing  Flowsheets (Taken 11/10/2022 1810)  Progress: improving  Plan of Care Reviewed With: patient  Goal: Patient-Specific Goal (Individualized)  Outcome: Progressing  Goal: Absence of Hospital-Acquired Illness or Injury  Outcome: Progressing  Intervention: Identify and Manage Fall Risk  Recent Flowsheet Documentation  Taken 11/10/2022 0800 by Scherrie Gerlach, RN  Safety Interventions:   aspiration precautions   bed alarm   fall reduction program maintained   lighting adjusted for tasks/safety   low bed   nonskid shoes/slippers when out of bed   toileting scheduled  Intervention: Prevent Skin Injury  Recent Flowsheet Documentation  Taken 11/10/2022 1800 by Scherrie Gerlach, RN  Positioning for Skin: Left  Taken 11/10/2022 1600 by Scherrie Gerlach, RN  Positioning for Skin: Right  Taken 11/10/2022 1400 by Scherrie Gerlach, RN  Positioning for Skin: Left  Taken 11/10/2022 1200 by Scherrie Gerlach, RN  Positioning for Skin: Right  Taken 11/10/2022 1000 by Scherrie Gerlach, RN  Positioning for Skin: Left  Taken 11/10/2022 0800 by Scherrie Gerlach, RN  Positioning for Skin: Right  Device Skin Pressure Protection:   absorbent pad utilized/changed   positioning supports utilized   pressure points protected   tubing/devices free from skin contact  Skin Protection:   adhesive use limited   cleansing with dimethicone incontinence wipes   incontinence pads utilized   protective footwear used   pulse oximeter probe site changed   tubing/devices free from skin contact  Intervention: Prevent and Manage VTE (Venous Thromboembolism) Risk  Recent Flowsheet Documentation  Taken 11/10/2022 1800 by Scherrie Gerlach, RN  Anti-Embolism Device Type: SCD, Knee  Anti-Embolism Intervention: On  Anti-Embolism Device Location: BLE  Taken 11/10/2022 1600 by Scherrie Gerlach, RN  VTE Prevention/Management:   ambulation promoted   fluids promoted  Anti-Embolism Device Type: SCD, Knee  Anti-Embolism Intervention: On  Anti-Embolism Device Location: BLE  Taken 11/10/2022 1400 by Scherrie Gerlach, RN  Anti-Embolism Device Type: SCD, Knee  Anti-Embolism Intervention: On  Anti-Embolism Device Location: BLE  Taken 11/10/2022 1200 by Scherrie Gerlach, RN  VTE Prevention/Management:   ambulation promoted   dorsiflexion/plantar flexion performed   fluids promoted  Anti-Embolism Device Type: SCD, Knee  Anti-Embolism Intervention: On  Anti-Embolism Device Location: BLE  Taken 11/10/2022 0800 by Scherrie Gerlach, RN  VTE Prevention/Management:   dorsiflexion/plantar flexion performed   fluids promoted  Anti-Embolism Device Type: SCD, Knee  Anti-Embolism Intervention: On  Anti-Embolism Device Location: BLE  Intervention: Prevent Infection  Recent Flowsheet Documentation  Taken 11/10/2022 0800 by Scherrie Gerlach, RN  Infection Prevention: hand hygiene promoted  Goal: Optimal Comfort and Wellbeing  Outcome: Progressing  Goal: Readiness for Transition of Care  Outcome: Progressing  Goal: Rounds/Family Conference  Outcome: Progressing     Problem: Infection  Goal: Absence of Infection Signs and Symptoms  Outcome: Progressing  Intervention: Prevent or Manage Infection  Recent Flowsheet Documentation  Taken 11/10/2022 0800 by Scherrie Gerlach, RN  Infection Management: aseptic technique maintained     Problem: Self-Care Deficit  Goal: Improved Ability to Complete Activities of Daily Living  Outcome: Progressing     Problem: Fall Injury Risk  Goal: Absence of Fall and Fall-Related Injury  Outcome: Progressing  Intervention: Promote Injury-Free Environment  Recent Flowsheet Documentation  Taken 11/10/2022 0800 by Scherrie Gerlach, RN  Safety Interventions:   aspiration precautions   bed alarm   fall reduction program maintained   lighting adjusted for tasks/safety   low bed  nonskid shoes/slippers when out of bed   toileting scheduled     Problem: Comorbidity Management  Goal: Blood Glucose Levels Within Targeted Range  Outcome: Progressing  Intervention: Monitor and Manage Glycemia  Recent Flowsheet Documentation  Taken 11/10/2022 1701 by Scherrie Gerlach, RN  Glycemic Management:   blood glucose monitored   supplemental insulin given  Taken 11/10/2022 1152 by Scherrie Gerlach, RN  Glycemic Management:   blood glucose monitored   supplemental insulin given  Taken 11/10/2022 5784 by Scherrie Gerlach, RN  Glycemic Management: blood glucose monitored     Problem: Comorbidity Management  Goal: Blood Pressure in Desired Range  Outcome: Progressing

## 2022-11-11 NOTE — Unmapped (Signed)
Physician Discharge Summary    Admit date: 11/05/2022    Discharge date and time: 11/12/22     Discharge to: Home    Discharge Service: Med Undesignated (MDX)    Discharge Attending Physician: Erlinda Hong, MD    Discharge Diagnoses:  Active Problems:    Pulmonary sarcoidosis (CMS-HCC)    HTN (hypertension)    Type 2 diabetes mellitus with hyperglycemia (CMS-HCC)    Acute on chronic hypoxic respiratory failure (CMS-HCC)    Chronic pulmonary aspergillosis (CMS-HCC)    Severe persistent asthma dependent on systemic steroids    Pulmonary hypertension (CMS-HCC)      Outpatient Provider Follow Up Issues:   Continue to manage chronic respiratory failure  Check BMP in ~1 week to reassess renal fxn, lytes; may need further adjustment of lasix    Procedures: None    Pertinent Test Results: labs: most recent pasted here, see below for pertinents and epic for full results    Lab Results   Component Value Date    WBC 7.7 11/11/2022    HGB 10.6 (L) 11/11/2022    HCT 32.6 (L) 11/11/2022    PLT 260 11/11/2022       Lab Results   Component Value Date    NA 141 11/11/2022    K 4.4 11/11/2022    CL 99 11/11/2022    CO2 >40.0 (HH) 11/11/2022    BUN 16 11/11/2022    CREATININE 1.22 (H) 11/11/2022    GLU 96 11/11/2022    CALCIUM 10.0 11/11/2022    MG 1.7 11/08/2022    PHOS 2.7 11/05/2022       Lab Results   Component Value Date    BILITOT 1.1 11/05/2022    BILIDIR <0.10 05/30/2019    PROT 7.2 11/05/2022    ALBUMIN 3.3 (L) 11/05/2022    ALT <7 (L) 11/05/2022    AST 9 11/05/2022    ALKPHOS 84 11/05/2022    GGT 60 (H) 05/31/2011       Lab Results   Component Value Date    PT 11.9 12/11/2019    INR 1.02 12/11/2019    APTT 30.9 12/11/2019       Sputum Culture: Serratia, S to CTX, bactrim, FQs and others    CTA CHEST  Impression      1.  No pulmonary emboli or evidence of pulmonary hemorrhage.   2.  Unchanged fibrotic pulmonary sarcoidosis with biapical upper lobe cystic bronchiectasis and scattered segmental and subsegmental fibrosis with bronchiectasis.   3.  Unchanged bulky eggshell calcified mediastinal and hilar lymphadenopathy compatible with pulmonary sarcoidosis.       Hospital Course:      Karen Barber is a 57 y.o. female who is presenting to [facility] with acute on chronic hypoxemic respiratory failure in the setting of pneumonia complicated by history of bronchiectasis, reactive airway disease, and aspergillosis on posaconazole.     She was recently hospitalized for acute on chronic hypoxemic and hypercarbic respiratory failure in the setting of hemoptysis.  She was diuresed and treated with antibiotics.  She saw Dr. Audrea Muscat in the pulmonary clinic 5 days ago, reporting that she felt to be at 90% of her baseline.     She presented due to at least 48 hours of worsening shortness of breath and increased oxygen requirement.  She was using 6 L of oxygen at baseline but was needing to increase it to 8-10L in order to maintain her oxygen saturations.  She had been coughing  up brown sputum with streaks, flecks, and small amounts of blood, not increased from prior.  She has found it difficult to cough up sputum in the absence of her airway clearance therapies.  She also noticed increased swelling of her lower extremities and in retrospect felt her 40mg  lasix wasn't really doing anything.  She typically sleeps propped up and had to increase her pillows further.  No nausea until she received IV fluid bolus in the ER.  She had subjective fevers at home but did not feel well enough to go and check her temperature.  Endorses soreness from coughing over her chest, left greater than right.  No GERD, dysphagia, regurgitation, or aspiration.     In the emergency room, she was given a 1 L fluid bolus over 15 minutes per sepsis protocol which likely worsened respiratory status.  She was also given azithromycin, vancomycin and Zosyn.  Given Solu-Medrol 125 mg.  Upon arrival to the CCU, she required 11 to 12 L of supplemental oxygen after being moved to the bed.     Acute on chronic hypoxemic respiratory failure  Acute hypercapnic respiratory failure  Pneumonia from bronchiectasis and RAD exacerbation  Pulm consulted and followed closely. O2 gradually weaned to 6L oximyzer which maintained appropriate sats even with exertion. Suspect presentation was multifactorial - bronchiectasis exacerbation, fluid overload, acute and chronic pulmonary infections. Started on AVAPS overnight and provided AVAPS machine for home. Dypsnea and sputum production steadily improved improve. Blood culture had no growth. Negative RPP and MRSA PCR and screen. AFB smear and culture negative. Sputum culture grew Serratia marcescen susceptible to FQs among other options. At discharge antibiotics are being narrowed to levofloxacin from zosyn (later cefepime). She was also treated with and educated on aggressive airway clearance with nebulized normal saline mixed with 3% saline and aerobika. Prescribed Pulmicort, Brovana as well. Is being started on daily azithro for antiinflammatory effect but will hold while on levoflox due to multiple QT prolonging meds.      Dispo Plan:  - VIR consulted, outpatient evaluation for BAE scheduled 10/25 with Dr. Duanne Limerick  - Outpatient double contrast barium swallow to evaluate esophagus given appearance on CT, location of pneumonia, and sore throat   - Changed supplemental oxygen company from [place] to [employer]  - Have patient call [employer] to discontinue services once she receives all materials from [place]  - Discharge home with Oxymizer device, new nebulizer, normal saline nebulizer solution, Aerobika, and Anoro daily.   - Pulm working on expediting outpatient follow-up.      [facility] [place]  Pulmonary hypertension  RV Failure  Diuresed well with 80mg  IV TID. Transitioned to 80mg  PO BID as she noted she wasn't peeing much on her 40mg  home dose. At time of disharge weight appears to be slowly trending up on this down, though she reports excellent urine output and swelling much improved. Counseled regarding need for daily weight and watching for inc swelling, will take 80mg  PO BID but with third dose on days weight or swelling are increasing. Suggest a BMP next week to ensure renal function and electrolytes are stable. Discharge weight 91kg. Continue on Sildenafil 40 mg TID for pulmonary hypertension     CKD stage III   Baseline creatinine appears to be around 1.2-1.3. Cr elevated during admission, improved to 1.2 at discharge (improving on continued lasix). Renal GFR may be closer to 30 based on cystatin C.      Type 2 diabetes mellitus with hyperglycemia   HgbA1c 8.3% July  2024, Home regimen of glargine 18 units nightly, sliding scale insulin was adjusted as needed plus sliding scale insulin.     Anemia - mixed  Likely with iron deficiency anemia (iron sat <10%) in addition to some combination of anemia of CKD and anemia of chronic disease. Was given iron supplementation with IV iron 8/22 to replete 1.5 gram deficit.      Chronic pulmonary aspergillosis  AFB smear and culture negative  - Continued home posaconazole 200 mg daily. Qtc was monitored.     HTN  - Hold home amlodipine indefinitely - BP well controlled and may have contributed to lower extremity edema    Condition at Discharge: good  Discharge Medications:      Your Medication List        STOP taking these medications      amlodipine 5 MG tablet  Commonly known as: NORVASC            START taking these medications      arformoterol 15 mcg/2 mL  Commonly known as: BROVANA  Inhale 2 mL (15 mcg total) by nebulization in the morning and 2 mL (15 mcg total) in the evening.     azithromycin 250 MG tablet  Commonly known as: ZITHROMAX  Take 1 tablet (250 mg total) by mouth daily. Start on 9/1 after completing the levofloxacin  Start taking on: November 19, 2022     budesonide 0.5 mg/2 mL nebulizer solution  Commonly known as: PULMICORT  Inhale 2 mL (0.5 mg total) by nebulization in the morning and 2 mL (0.5 mg total) in the evening.     levoFLOXacin 500 MG tablet  Commonly known as: LEVAQUIN  Take 1 tablet (500 mg total) by mouth daily. First dose this afternoon, ending 8/31     sodium chloride 3 % NEBULIZER solution  Inhale 4 mL by nebulization in the morning and 4 mL in the evening.            CHANGE how you take these medications      furosemide 40 MG tablet  Commonly known as: LASIX  Take 2 tablets (80 mg total) by mouth two (2) times a day. Take a third dose as needed for increasing weight or increasing swelling.  What changed:   how much to take  additional instructions            CONTINUE taking these medications      ACCU-CHEK GUIDE GLUCOSE METER Misc  Generic drug: blood-glucose meter  1 Device by Other route Four (4) times a day (before meals and nightly). AS DIRECTED     albuterol 2.5 mg /3 mL (0.083 %) nebulizer solution  Inhale 3 mL (2.5 mg total) by nebulization every six (6) hours as needed for wheezing or shortness of breath (airway clearance/cough).     albuterol 90 mcg/actuation inhaler  Commonly known as: PROVENTIL HFA;VENTOLIN HFA  Inhale 2 puffs every four (4) hours as needed for wheezing or shortness of breath.     aspirin 81 MG tablet  Commonly known as: ECOTRIN  Take 1 tablet (81 mg total) by mouth daily.     BASAGLAR KWIKPEN U-100 INSULIN 100 unit/mL (3 mL) injection pen  Generic drug: insulin glargine  Inject 0.18 mL (18 Units total) under the skin nightly.     BAYER BACK AND BODY 500-32.5 mg Tab  Generic drug: aspirin-caffeine  Take by mouth. Takes 2 1/2 pills 3-4 times a day as needed.     blood  sugar diagnostic Strp  by Other route Three (3) times a day. ACCU-CHEK Guide meter     gabapentin 300 MG capsule  Commonly known as: NEURONTIN  Take 1 capsule (300 mg total) by mouth Three (3) times a day.     insulin aspart 100 unit/mL vial  Commonly known as: NovoLOG U-100 Insulin aspart  Inject 0.01-0.2 mL (1-20 Units total) under the skin Four (4) times a day (before meals and nightly). SLIDING SCALE ipratropium 0.02 % nebulizer solution  Commonly known as: ATROVENT  Inhale 2.5 mL (500 mcg total) by nebulization four (4) times a day.     OXYGEN-AIR DELIVERY SYSTEMS MISC  Dose: 2-3 LPM     posaconazole 100 mg delayed released tablet  Commonly known as: NOXAFIL  Take 2 tablets by mouth once daily     sildenafiL (pulm.hypertension) 20 mg tablet  Commonly known as: REVATIO  Take 1 tablet (20 mg total) by mouth Three (3) times a day.              Pending Test Results:     Pending Labs       Order Current Status    Lower Respiratory Culture In process    AFB culture Preliminary result    Lower Respiratory Culture Preliminary result            Discharge Instructions:     Other Instructions       Discharge instructions      Discharge Instructions:    You were admitted to [facility] for bronchiectasis with exacerbation, chronic lung infection, worsening oxygen levels, swelling with fluid overload. You were treated with diuretics, antibiotics, and aggressive airway clearance. You are now ready to discharge and will be discharging to: Home.    Please carefully read and follow these instructions below upon your discharge. It has been a pleasure taking care of you, and we wish you the best.     1) Please take your medications as prescribed. Major medication changes are listed below, and a full list of medications is in this discharge packet. At future follow-up appointments, please be sure to take all of your medications with you so your provider can better guide your care.     MEDICATION CHANGES:  -- START:  - New maintenance breathing treatments brovana and pulmicort - to keep your lungs healthy even if you're feeling well  - Twice daily airway clearance as outlined below. If you're feeling sick or congested, you can increase this to 3-4 times daily  - Levofloxacin - antibiotic for 8 more days - first dose this afternoon after discharge  - Azithromycin - antibiotic that you will continue long term for lung health - WAIT to start it until after you finish the levofloxacin to avoid potential interactions  -- CHANGE lasix (furosemide) water pill to 80mg  twice daily. Please watch your fluid status very carefully as below, to determine if the dose needs to be adjusted.     Order of Treatments (twice a day)  1. Bronchodilator (Albuterol/Ipratropium): Relaxes the large smooth muscle and opens up the lungs. Mix with hypertonic saline 3%.  2. Hypertonic Saline 3% (mixed with Duonebs): Hydrates the lungs and helps to thin the mucus to make it easier to cough up. Can be done with airway clearance.  3. Airway Clearance Waymon Budge): Helps to mobilize the mucus to make it easier to cough up. Perform with steps 1 & 2.  4. Inhaled Corticosteroids and Long Acting Bronchodilator (Budesonide and Arformoterol): Maintenance  medications; take daily even if you feel fine. To be done after lungs are open and cleaned out.     Please weigh yourself every morning on the same scale. You weight was going up in the hospital but it was reassuring that your swelling and breathing were unchanged. However, if your weight continues to increase, it is a sign that you likely need more of the water pill Lasix. If you gain 2 pounds in a week or 5 pounds in a day, you probably need more lasix. Other signs you may need more lasix include increasing swelling, bloating, shortness of breath especially when lying flat. You can take an extra dose of 80mg  as needed. Please work with your outpatient doctors to continue to adjust this dose as needed.       FOLLOW-UP:  2) Seek medical care with your primary care doctor or local Emergency Room or Urgent Care if you develop any changes in your mental status, worsening abdominal pain, fevers greater than 101.5, any unexplained/unrelieved shortness of breath, uncontrolled nausea and vomiting that keeps you from remaining hydrated or taking your medication, or any other concerning symptoms.     3) It is recommended that you see your primary care provider (PCP) after being in the hospital. If you do not see an appointment listed below with your primary care doctor, please call your doctor's office as soon as possible to schedule an appointment to be seen within 7-10 days of discharge. Your PCP is Loran Senters, FNP and the phone number of their office is [phone]. Please call your PCP's office as soon as possible to schedule an appointment with them. They will likely want to recheck your labs to check on your kidney function and can help adjust your lasix and other medications as needed.     4) You should hear about an appointment with pulmonology.. If an appointment with them is not listed in this discharge packet and you do not hear from them to schedule an appointment, please call the appropriate phone number below. If you still do not hear from them, please call Loran Senters, FNP.     [facility] [phone]     5) Please go to any follow-up appointments that were already scheduled. Some of your follow-up appointments have been listed in this discharge packet.        Follow Up instructions and Outpatient Referrals     Ambulatory Referral to Home Health      Reason for referral: Therapy    Physician to follow patient's care: PCP    Disciplines requested:  Physical Therapy  Occupational Therapy       Physical Therapy requested: Evaluate and treat    Occupational Therapy Requested: Evaluate and treat    Discharge instructions        Appointments which have been scheduled for you      Nov 14, 2022 2:00 PM  (Arrive by 1:30 PM)  PET PREP 90 MIN with Jacquelin Hawking INJ RM 6  IMG PET CT [facility]) [address]  [place] [address]  [phone]        Nov 14, 2022 3:30 PM  (Arrive by 3:00 PM)  PET SCAN 30 MIN with [facility] PETCT RM 2  IMG PET CT [facility]) [address]  [place] [address]  [phone]   [place]  Please do not eat or drink anything 6 hours prior to your appointment time.  Please  bring any recent lab work from outside facilities.  If you are diabetic please check with your physician regarding your diabetic medications for the day of your procedure.  Do not take any insulin 6 hours prior to your scheduled appointment time.         Nov 24, 2022 10:30 AM  (Arrive by 10:15 AM)  NEW VIDEO MYCHART with Silva Bandy, RD/LDN  [facility] PULMONARY SPECIALTY NUTR [place] [facility] REGION) [address]  [facility] 1 through 4  [place] [address]  [phone]   Please sign into My [place] Chart at least 15 minutes before your appointment to complete the eCheck-In process. You must complete eCheck-In before you can start your video visit. We also recommend testing your audio and video connection to troubleshoot any issues before your visit begins. Click ???Join Video Visit??? to complete these checks. Once you have completed eCheck-In and tested your audio and video, click ???Join Call??? to connect to your visit.     For your video visit, you will need a computer with a working camera, speaker and microphone, a smartphone, or a tablet with internet access.    My [place] Chart enables you to manage your health, send non-urgent messages to your provider, view your test results, schedule and manage appointments, and request prescription refills securely and conveniently from your computer or mobile device.    You can go to [email or web address] to sign in to your My [place] Chart account with your username and password. If you have forgotten your username or password, please choose the ???Forgot Username???? and/or ???Forgot Password???? links to gain access. You also can access your My [place] Chart account with the free MyChart mobile app for Android or iPhone.    If you need assistance accessing your My  Chart account or for assistance in reaching your provider's office to reschedule or cancel your appointment, please call [facility] [phone].         Dec 26, 2022 10:20 AM  (Arrive by 10:05 AM)  RETURN CONTINUITY with Loran Senters, FNP  [facility] PRIMARY CARE [address] AT [facility] REGION) [address] [place] [address]  [phone]        Jan 12, 2023 8:20 AM  (Arrive by 7:50 AM)  NEW  GENERAL with Shaaron Adler, MD  [facility] VIR CLINIC [address] [place] [facility] REGION) [address]  [place] [address]  [phone]        Feb 09, 2023 10:20 AM  (Arrive by 10:05 AM)  PFT with PFT 2  [facility] PULMONARY SPECIALTY FUNCT [place]) [address]  [place] [address] through [address]  [place] [address]  [phone]   If you have inhaled breathing medications, please do not use it 4 hours prior to this breathing test.  Please wear comfortable clothing and well-fitting , closed toe shoes in the even that a 6-minute walk is part of your test.  If you have fallen in the past month please inform your respiratory therapist at the start of your appointment.    While you may bring a guest to your appointment, they will not be able to be present in the testing area.         Feb 09, 2023 11:00 AM  (Arrive by 10:45 AM)  NEW  PULM HYPERTENSION with Jettie Booze, MD  [facility] PULMONARY SPECIALTY CL [place] [facility] REGION) [address]  [facility] 1 through 4  [place] [address]  [phone]        Feb 20, 2023 8:50 AM  (Arrive by 8:35 AM)  PFT with PFT 1  [facility] PULMONARY SPECIALTY FUNCT [place]) [address]  [place] [address] through [address]  [place] [address]  [phone]   If you have inhaled breathing medications, please do not use it 4 hours prior to this breathing test.  Please wear comfortable clothing and well-fitting , closed toe shoes in the even that a 6-minute walk is part of your test.  If you have fallen in the past month please inform your respiratory therapist at the start of your appointment.    While you may bring a guest to your appointment, they will not be able to be present in the testing area.         Feb 20, 2023 9:30 AM  (Arrive by 9:15 AM)  RETURN BRONCHIECTSIS with Satira Sark, MD  [facility] PULMONARY SPECIALTY CL [place] [facility] REGION) [address]  [place] [address]  [place] [address]  [phone]              Resources and Referrals       Nebulizers      Supplies:  Administration Kit  Mouth Piece       Length of Need: 99    Supplies: Administration Kit 2 per month   Supplies: Mouthpiece 1 per month  Diagnosis= ?    (MD pls fill Dx)    Requires: Pari Proneb Max per pulm for safe drug delivery        Supplies: Administration Kit 2 per month   Supplies: Mouthpiece 1 per month  Diagnosis= ?    (MD pls fill Dx)    Requires: Pari Proneb Max per pulm for safe drug delivery    Oxygen      Oxygen Type: Continuous    % saturation on room air at rest: 76    Provide Continous Oxygen at (LPM): 6    Oxygen device delivery method: Nasal Cannula    Type of portable O2 tank and concentrator to deliver: Gaseous    Other Orders: Optional Systems for Oxygen delivery    Optional System for Delivery: Eval for Oxygen Conserving Device System    Provide: Lightweight portable tank, carry bag, appropriate conserving device    Maintain SaO2 (greater than or = to)%: 92    Length of Need: 99 months    Patient will need **oximyzer**.      Oxygen saturation on room air with patient at rest  = 76%  Oxygen saturation on room air with exertion / ambulation = 64%  Oxygen saturation with exertion / ambulating on oxygen = 91% on 6 lpm OXIMIZER        Patient will need **oximyzer**.      Oxygen saturation on room air with patient at rest  = 76%  Oxygen saturation on room air with exertion / ambulation = 64%  Oxygen saturation with exertion / ambulating on oxygen = 91% on 6 lpm OXIMIZER            I spent greater than 30 minutes in the discharge of this patient.

## 2022-11-11 NOTE — Unmapped (Incomplete)
Pt remains SD status. Still desatting to the 80s when actively moving around in bed, otherwise, VSS.   Problem: Adult Inpatient Plan of Care  Goal: Plan of Care Review  Outcome: Progressing  Flowsheets (Taken 11/11/2022 0452)  Progress: improving  Plan of Care Reviewed With: patient  Goal: Patient-Specific Goal (Individualized)  Outcome: Progressing  Goal: Absence of Hospital-Acquired Illness or Injury  Outcome: Progressing  Intervention: Identify and Manage Fall Risk  Recent Flowsheet Documentation  Taken 11/10/2022 2000 by Gardiner Sleeper, RN  Safety Interventions:   fall reduction program maintained   low bed   lighting adjusted for tasks/safety   aspiration precautions   infection management   nonskid shoes/slippers when out of bed  Intervention: Prevent Skin Injury  Recent Flowsheet Documentation  Taken 11/11/2022 0400 by Gardiner Sleeper, RN  Positioning for Skin: Supine/Back  Taken 11/11/2022 0200 by Gardiner Sleeper, RN  Positioning for Skin: Right  Taken 11/11/2022 0000 by Gardiner Sleeper, RN  Positioning for Skin: Supine/Back  Taken 11/10/2022 2200 by Gardiner Sleeper, RN  Positioning for Skin: Left  Taken 11/10/2022 2000 by Gardiner Sleeper, RN  Positioning for Skin: Bed in Chair  Device Skin Pressure Protection:   positioning supports utilized   pressure points protected   tubing/devices free from skin contact  Skin Protection:   adhesive use limited   tubing/devices free from skin contact   incontinence pads utilized  Intervention: Prevent and Manage VTE (Venous Thromboembolism) Risk  Recent Flowsheet Documentation  Taken 11/11/2022 0400 by Gardiner Sleeper, RN  VTE Prevention/Management:   fluids promoted   ambulation promoted  Anti-Embolism Device Type: SCD, Knee  Anti-Embolism Intervention: On  Anti-Embolism Device Location: BLE  Taken 11/11/2022 0200 by Gardiner Sleeper, RN  Anti-Embolism Device Type: SCD, Knee  Anti-Embolism Intervention: On  Anti-Embolism Device Location: BLE  Taken 11/11/2022 0000 by Gardiner Sleeper, RN  VTE Prevention/Management:   fluids promoted   ambulation promoted  Anti-Embolism Device Type: SCD, Knee  Anti-Embolism Intervention: On  Anti-Embolism Device Location: BLE  Taken 11/10/2022 2200 by Gardiner Sleeper, RN  Anti-Embolism Device Type: SCD, Knee  Anti-Embolism Intervention: On  Anti-Embolism Device Location: BLE  Taken 11/10/2022 2000 by Gardiner Sleeper, RN  VTE Prevention/Management:   fluids promoted   ambulation promoted  Anti-Embolism Device Type: SCD, Knee  Anti-Embolism Intervention: On  Anti-Embolism Device Location: BLE  Intervention: Prevent Infection  Recent Flowsheet Documentation  Taken 11/10/2022 2000 by Gardiner Sleeper, RN  Infection Prevention:   hand hygiene promoted   rest/sleep promoted   single patient room provided   equipment surfaces disinfected  Goal: Optimal Comfort and Wellbeing  Outcome: Progressing  Goal: Readiness for Transition of Care  Outcome: Progressing  Goal: Rounds/Family Conference  Outcome: Progressing

## 2022-11-12 MED ADMIN — insulin lispro (HumaLOG) injection 0-20 Units: 0-20 [IU] | SUBCUTANEOUS | @ 01:00:00

## 2022-11-12 MED ADMIN — acetaminophen (TYLENOL) tablet 650 mg: 650 mg | ORAL | @ 05:00:00 | Stop: 2022-11-12

## 2022-11-12 MED ADMIN — budesonide (PULMICORT) nebulizer solution 0.5 mg: .5 mg | RESPIRATORY_TRACT | @ 12:00:00 | Stop: 2022-11-12

## 2022-11-12 MED ADMIN — insulin glargine (LANTUS) injection 15 Units: 15 [IU] | SUBCUTANEOUS | @ 01:00:00

## 2022-11-12 MED ADMIN — ipratropium-albuterol (DUO-NEB) 0.5-2.5 mg/3 mL nebulizer solution 3 mL: 3 mL | RESPIRATORY_TRACT

## 2022-11-12 MED ADMIN — levoFLOXacin (LEVAQUIN) tablet 750 mg: 750 mg | ORAL | @ 13:00:00 | Stop: 2022-11-12

## 2022-11-12 MED ADMIN — sildenafiL (pulm.hypertension) (REVATIO) tablet 40 mg: 40 mg | ORAL | @ 01:00:00

## 2022-11-12 MED ADMIN — gabapentin (NEURONTIN) capsule 200 mg: 200 mg | ORAL | @ 01:00:00

## 2022-11-12 MED ADMIN — sodium chloride 3 % NEBULIZER solution 4 mL: 4 mL | RESPIRATORY_TRACT

## 2022-11-12 MED ADMIN — arformoterol (BROVANA) nebulizer solution 15 mcg/2 mL: 15 ug | RESPIRATORY_TRACT | @ 12:00:00 | Stop: 2022-11-12

## 2022-11-12 MED ADMIN — ipratropium-albuterol (DUO-NEB) 0.5-2.5 mg/3 mL nebulizer solution 3 mL: 3 mL | RESPIRATORY_TRACT | @ 12:00:00 | Stop: 2022-11-12

## 2022-11-12 MED ADMIN — arformoterol (BROVANA) nebulizer solution 15 mcg/2 mL: 15 ug | RESPIRATORY_TRACT

## 2022-11-12 MED ADMIN — sildenafiL (pulm.hypertension) (REVATIO) tablet 40 mg: 40 mg | ORAL | @ 13:00:00 | Stop: 2022-11-12

## 2022-11-12 MED ADMIN — budesonide (PULMICORT) nebulizer solution 0.5 mg: .5 mg | RESPIRATORY_TRACT

## 2022-11-12 MED ADMIN — posaconazole (NOXAFIL) delayed released tablet 200 mg: 200 mg | ORAL | @ 13:00:00 | Stop: 2022-11-12

## 2022-11-12 MED ADMIN — cefepime (MAXIPIME) 2 g in sodium chloride 0.9 % (NS) 100 mL IVPB-MBP: 2 g | INTRAVENOUS | @ 02:00:00 | Stop: 2022-11-11

## 2022-11-12 MED ADMIN — sodium chloride 3 % NEBULIZER solution 4 mL: 4 mL | RESPIRATORY_TRACT | @ 12:00:00 | Stop: 2022-11-12

## 2022-11-12 MED ADMIN — furosemide (LASIX) tablet 80 mg: 80 mg | ORAL | @ 13:00:00 | Stop: 2022-11-12

## 2022-11-12 MED ADMIN — gabapentin (NEURONTIN) capsule 200 mg: 200 mg | ORAL | @ 13:00:00 | Stop: 2022-11-12

## 2022-11-13 MED ORDER — LEVOFLOXACIN 750 MG TABLET
ORAL_TABLET | Freq: Every day | ORAL | 0 refills | 6 days | Status: CP
Start: 2022-11-13 — End: ?

## 2022-11-17 NOTE — Unmapped (Signed)
Fax requested, signed prescription for nebulizer to [place]

## 2022-11-17 NOTE — Unmapped (Signed)
-----   Message from Chiloquin G sent at 11/17/2022 10:16 AM EDT -----  Regarding: RE: Need to move up appts with Despotes and/or Ala Dach  I have called patient and sent message  ----- Message -----  From: Felipe Drone  Sent: 11/17/2022   9:30 AM EDT  To: Truett Mainland, MD; #  Subject: RE: Need to move up appts with Despotes and/#    Angelique Blonder,    Please call patient and let her know she is schedule as requested below.  Dr. Audrea Muscat at 10/4 @10am  with PFTs following with Dr. Earlean Shawl.    Thanks  Missy  ----- Message -----  From: Jettie Booze, MD  Sent: 11/14/2022   3:48 PM EDT  To: Felipe Drone; Truett Mainland, MD; #  Subject: RE: Need to move up appts with Despotes and/#    Looks like she has appts on 9/5 and 11/22.  I am not able to get her in sooner unfortunately.  ----- Message -----  From: Fabiola Backer  Sent: 11/14/2022  12:01 PM EDT  To: Felipe Drone; Jettie Booze, MD; #  Subject: RE: Need to move up appts with Despotes and/#    Following up on this as I did not see final resolution - what time should we offer for the patient so that Missy can override?    Thanks!  Angelique Blonder  ----- Message -----  From: Vicente Masson  Sent: 11/10/2022   3:12 PM EDT  To: Felipe Drone; Jettie Booze, MD; #  Subject: RE: Need to move up appts with Despotes and/#      ----- Message -----  From: Truett Mainland, MD  Sent: 11/10/2022   3:07 PM EDT  To: Jettie Booze, MD; #  Subject: RE: Need to move up appts with Despotes and/#    That works for me.  ----- Message -----  From: Satira Sark, MD  Sent: 11/10/2022   3:06 PM EDT  To: Jettie Booze, MD; #  Subject: RE: Need to move up appts with Despotes and/#    Candelaria Stagers - if you'd be willing to precept me on Friday 10/4 I could add her on and see her in clinic that day while you're also in clinic  ----- Message -----  From: Vicente Masson  Sent: 11/10/2022   1:58 PM EDT  To: Jettie Booze, MD; #  Subject: RE: Need to move up appts with Despotes and/#    Just a heads up there is nothing available for scheduling sooner, if dr. Ala Dach or dr. Audrea Muscat agree to something sooner, the exact dates and times will have to be sent to missy for add ons.  ----- Message -----  From: Truett Mainland, MD  Sent: 11/10/2022  12:36 PM EDT  To: Jettie Booze, MD; #  Subject: Need to move up appts with Despotes and/or F#    Patient is admitted to [facility] and in anticipation of discharge in next few days, reaching out to see about moving her appointments with Drs. Despotes and Ford up.  She isn't scheduled to see them until late November/early December.  During this admission, started on AVAPS, increased diuretics & sildenafil, and adjusted airway clearance.    Copied covering hospitalist on this message too.    MLD

## 2022-11-19 MED ORDER — AZITHROMYCIN 250 MG TABLET
ORAL_TABLET | ORAL | 0 refills | 90 days | Status: CP
Start: 2022-11-19 — End: ?

## 2022-11-21 DIAGNOSIS — I272 Pulmonary hypertension, unspecified: Principal | ICD-10-CM

## 2022-11-21 DIAGNOSIS — D869 Sarcoidosis, unspecified: Principal | ICD-10-CM

## 2022-11-21 MED ORDER — SILDENAFIL (PULMONARY HYPERTENSION) 20 MG TABLET
ORAL_TABLET | Freq: Three times a day (TID) | ORAL | 0 refills | 30 days
Start: 2022-11-21 — End: 2022-12-21

## 2022-11-21 MED ORDER — ALBUTEROL SULFATE HFA 90 MCG/ACTUATION AEROSOL INHALER
RESPIRATORY_TRACT | 0 refills | 0 days | Status: CP | PRN
Start: 2022-11-21 — End: 2023-11-21

## 2022-11-21 NOTE — Unmapped (Signed)
Patient is requesting the following refill  Requested Prescriptions     Pending Prescriptions Disp Refills    albuterol HFA 90 mcg/actuation inhaler 54 g 1     Sig: Inhale 2 puffs every four (4) hours as needed for wheezing or shortness of breath.       Recent Visits  Date Type Provider Dept   09/25/22 Office Visit Loran Senters, FNP [facility] [address] [facility]   06/12/22 Office Visit Loran Senters, FNP [facility] [address] [facility]   05/16/22 Office Visit Loran Senters, FNP [facility] [address] [facility]   Showing recent visits within past 365 days and meeting all other requirements  Future Appointments  Date Type Provider Dept   12/26/22 Appointment Loran Senters, FNP [facility] [address] [facility]   Showing future appointments within next 365 days and meeting all other requirements

## 2022-11-24 NOTE — Unmapped (Signed)
The patient is requesting for the Nurse to contact them in regards to hospital admission follow-up appointment. Patient was discharged on 8/25. The first available appt is > 14 days out.   Please contact The patient by Cell Phone

## 2022-11-24 NOTE — Unmapped (Unsigned)
[facility]   Medical Nutrition Therapy Consultation       Visit Type:    Initial Assessment    Referral Reason:   ***       Karen Barber is a 57 y.o. female seen for medical nutrition therapy. Her active problem list, medication list, allergies, notes from last encounter, and lab results were reviewed.     Her interim medical history is significant for ***.    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 A.niger from sputum 08/2016-09/2016.    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    Wt Readings from Last 5 Encounters:   11/11/22 90.1 kg (198 lb 10.2 oz)   10/31/22 84.8 kg (187 lb)   10/22/22 84 kg (185 lb 3 oz)   09/25/22 83.9 kg (185 lb)   06/12/22 89.8 kg (198 lb)        Usual body weight: ***    Ideal Body Weight:         Nutrition Risk Screening:   Food Insecurity: No Food Insecurity (11/06/2022)    Hunger Vital Sign     Worried About Running Out of Food in the Last Year: Never true     Ran Out of Food in the Last Year: Never true        Nutrition Focused Physical Exam:                              Malnutrition Screening:        Biochemical Data, Medical Tests and Procedures:  All pertinent labs and imaging reviewed by Craig Staggers, RD/LDN at 9:50 AM 11/24/2022.    Lab Results   Component Value Date    NA 141 11/11/2022    K 4.4 11/11/2022    CL 99 11/11/2022    CO2 >40.0 (HH) 11/11/2022    BUN 16 11/11/2022    CREATININE 1.22 (H) 11/11/2022    GFR >= 60 05/31/2011    GLU 96 11/11/2022    CALCIUM 10.0 11/11/2022    ALBUMIN 3.3 (L) 11/05/2022    PHOS 2.7 11/05/2022         Medications and Vitamin/Mineral Supplementation:   All nutritionally pertinent medications reviewed on 11/24/2022.   Nutritionally pertinent medications include: Furosemide, Lasix.  She is {taking:44517} nutrition supplements. ***    Current Outpatient Medications  Medication Sig Dispense Refill    ACCU-CHEK GUIDE GLUCOSE METER Misc 1 Device by Other route Four (4) times a day (before meals and nightly). AS DIRECTED 1 kit 0    albuterol 2.5 mg /3 mL (0.083 %) nebulizer solution Inhale 3 mL (2.5 mg total) by nebulization every six (6) hours as needed for wheezing or shortness of breath (airway clearance/cough). 360 mL 3    albuterol HFA 90 mcg/actuation inhaler Inhale 2 puffs every four (4) hours as needed for wheezing or shortness of breath. 54 g 0    arformoterol (BROVANA) 15 mcg/2 mL Inhale 2 mL (15 mcg total) by nebulization in the morning and 2 mL (15 mcg total) in the evening. 120 mL 2    aspirin (ECOTRIN) 81 MG tablet Take 1 tablet (81 mg total) by mouth daily.      aspirin-caffeine (BAYER BACK AND BODY) 500-32.5 mg Tab Take by mouth. Takes 2 1/2 pills 3-4 times a day as needed.      azithromycin (ZITHROMAX) 250 MG tablet Take 1 tablet (250 mg total) by mouth daily. Start on 9/1 after completing the levofloxacin 90 tablet 0    blood sugar diagnostic Strp by Other route Three (3) times a day. ACCU-CHEK Guide meter 300 strip 1    budesonide (PULMICORT) 0.5 mg/2 mL nebulizer solution Inhale 2 mL (0.5 mg total) by nebulization in the morning and 2 mL (0.5 mg total) in the evening. 120 mL 2 furosemide (LASIX) 80 MG tablet Take 1 tablet (80 mg total) by mouth two (2) times a day. Take a third dose as needed for increasing weight or increasing swelling. 60 tablet 0    gabapentin (NEURONTIN) 300 MG capsule Take 1 capsule (300 mg total) by mouth Three (3) times a day. 270 capsule 3    insulin aspart (NOVOLOG U-100 INSULIN ASPART) 100 unit/mL vial Inject 0.01-0.2 mL (1-20 Units total) under the skin Four (4) times a day (before meals and nightly). SLIDING SCALE 20 mL 1    insulin glargine (BASAGLAR KWIKPEN U-100 INSULIN) 100 unit/mL (3 mL) injection pen Inject 0.18 mL (18 Units total) under the skin nightly. 15 mL 0    ipratropium (ATROVENT) 0.02 % nebulizer solution Inhale 2.5 mL (500 mcg total) by nebulization four (4) times a day. 300 mL 0    levoFLOXacin (LEVAQUIN) 500 MG tablet Take 1 tablet (500 mg total) by mouth daily. First dose this afternoon, ending 8/31 8 tablet 0    levoFLOXacin (LEVAQUIN) 750 MG tablet Take 1 tablet (750 mg total) by mouth daily. 6 tablet 0    OXYGEN-AIR DELIVERY SYSTEMS MISC Dose: 2-3 LPM      posaconazole (NOXAFIL) 100 mg delayed released tablet Take 2 tablets by mouth once daily 60 tablet 0    sildenafiL, pulm.hypertension, (REVATIO) 20 mg tablet Take 1 tablet (20 mg total) by mouth Three (3) times a day. 90 tablet 0    sodium chloride 3 % NEBULIZER solution Inhale 1 vial (4 mL) by nebulization in the morning and 1 vial (4 mL) in the evening. 240 mL 2     No current facility-administered medications for this visit.       Nutrition History:   Dietary Restrictions: {Dietary Restrictions:60153}    Gastrointestinal Issues: {GI Issues :60156}    Hunger and Satiety: {Hunger and Satiety:60157}    Food Safety and Access: {Food Safety and Access :59304}    Diet Recall:   Time Intake   Breakfast    Snack (AM)  Lunch    Snack (PM)    Dinner    Snack (HS)      Food-Related History:  Snacks:  ***  Beverages:  ***  Dining Out:  ***  Cooking Methods: ***  Usual Food Choices: ***   Meal Schedule:  ***        Eating Behaviors:  Overeating: {Overeating:60150}  Emotional Eating: {Emotional Eating:60146}  Grazing: {Grazing:60148}  Fast Eating: {Fast Eating:60147}  Nighttime Eating: {Nighttime Eating:60149}    Physical Activity:  {Physical Activity:60169}    Daily Estimated Nutritional Needs:  Energy:   kcals [  using  ,  ]  Protein:   gm [  using  ,  ]  Carbohydrate:   [ ]   Fluid:   [ ]   {Other Nutrients:60227}    Nutrition Goals & Evaluation      ***  ({MNT Goal Progress:77446})  ***  ({MNT Goal Progress:77446})  ***  ({MNT Goal Progress:77446})  ***  ({MNT Goal Progress:77446})  ***  ({MNT Goal Progress:77446})    Nutrition goals reviewed, relevant barriers identified and addressed: {MNT Barriers to Care:77905}. She is evaluated to have {DESC; GOOD/FAIR/POOR:18685} willingness and ability to achieve nutrition goals.       Nutrition Assessment       ***    Nutrition Intervention      {MNT Intervention:72370}    Nutrition Plan:   ***    Follow up will occur in ***.       Food/Nutrition-related history, Anthropometric measurements, Biochemical data, medical tests, procedures, and Nutrition-focused physical findings will be assessed at time of follow-up.       Recommendations for Clinical Team:  Continue excellent clinical care  ***     Jackie L. Dohrman's referral order and comments were reviewed on 11/24/2022.She has an {ACTIVE,INACTIVE:21138} referral order.     {MNT Time Associate Professor (In-person or Virtual):78573}      Karie Chimera, MS, RD, LDN, CNSC  Registered Dietitian

## 2022-11-24 NOTE — Unmapped (Signed)
Left message on Jackie's personal voice mail first available with Lorina Rabon is next Wednesday.  Recommended in future, she will need to call day she is discharged to schedule.  Please call [name] to schedule.

## 2022-11-27 MED ORDER — SILDENAFIL (PULMONARY HYPERTENSION) 20 MG TABLET
ORAL_TABLET | Freq: Three times a day (TID) | ORAL | 0 refills | 30 days
Start: 2022-11-27 — End: 2022-12-27

## 2022-11-27 NOTE — Unmapped (Signed)
Stacy with [facility] [phone].  Tried all last week to contact Shiro to schedule PT.  Finally got in touch with her and nee order to move visit for today.  Verbal okay given too Stacy.

## 2022-11-27 NOTE — Unmapped (Signed)
After PT visit today Karen Barber request orders for PT 2 weeks x 4 and 1 week x 4.  She also wants to add nursing for he disease management.  She states there is a contradiction between sildenafil and posaconazole.  Both meds prescribed by other providers.  Appt tomorrow.    Gave verbal okay for PT and nursing visit.

## 2022-11-28 NOTE — Unmapped (Unsigned)
Karen Barber is a 57 y.o. female who presents for Transition Care Management visit.   {INFORMANT with relatives:23352}    Assessment/Plan:         There are no diagnoses linked to this encounter.    Medical decision making was of {[id number]:60105::moderate [phone]- must be seen within 14 days)} complexity.    Durable medical equipment ordered: {Assistive Devices [id number]    Barriers to recommended plan: {barrierstocare:74100}    Community resources identified for patient/family: {COMMUNITY RESOURCES:26578}       Subjective:            Post discharge interactive communication via telephone was made with patient on *** and I have reviewed the information from that communication.    HPI  Karen Barber was hospitalized at [facility]:22095} from 11/05/22 to 11/12/22 due to ***. Discharge summary reviewed. Patient was able to get all new medications after being discharged: {yesnoNA:32910}. New symptoms since discharge:{yesnoNA:32910}. Patient has needed Home Health since discharge: {yesnoNA:32910}.     ROS  Constitutional:  Denies  unexpected weight loss or gain, or weakness***  Eyes:  Denies visual changes***  Respiratory:  Denies cough or shortness of breath. No change in exercise tolerance***  Cardiovascular:  Denies chest pain, palpitations or lower extremity swelling***  GI:  Denies abdominal pain, diarrhea, constipation***  Musculoskeletal:  Denies myalgias***  Skin:  Denies nonhealing lesions***  Neurologic:  Denies headache, focal weakness or numbness, tingling***  Endocrine:  Denies polyuria or polydipsia***  Psychiatric:  Denies depression, anxiety***    Outpatient Medications Prior to Visit   Medication Sig Dispense Refill    ACCU-CHEK GUIDE GLUCOSE METER Misc 1 Device by Other route Four (4) times a day (before meals and nightly). AS DIRECTED 1 kit 0    albuterol 2.5 mg /3 mL (0.083 %) nebulizer solution Inhale 3 mL (2.5 mg total) by nebulization every six (6) hours as needed for wheezing or shortness of breath (airway clearance/cough). 360 mL 3    albuterol HFA 90 mcg/actuation inhaler Inhale 2 puffs every four (4) hours as needed for wheezing or shortness of breath. 54 g 0    arformoterol (BROVANA) 15 mcg/2 mL Inhale 2 mL (15 mcg total) by nebulization in the morning and 2 mL (15 mcg total) in the evening. 120 mL 2    aspirin (ECOTRIN) 81 MG tablet Take 1 tablet (81 mg total) by mouth daily.      aspirin-caffeine (BAYER BACK AND BODY) 500-32.5 mg Tab Take by mouth. Takes 2 1/2 pills 3-4 times a day as needed.      azithromycin (ZITHROMAX) 250 MG tablet Take 1 tablet (250 mg total) by mouth daily. Start on 9/1 after completing the levofloxacin 90 tablet 0    blood sugar diagnostic Strp by Other route Three (3) times a day. ACCU-CHEK Guide meter 300 strip 1    budesonide (PULMICORT) 0.5 mg/2 mL nebulizer solution Inhale 2 mL (0.5 mg total) by nebulization in the morning and 2 mL (0.5 mg total) in the evening. 120 mL 2    furosemide (LASIX) 80 MG tablet Take 1 tablet (80 mg total) by mouth two (2) times a day. Take a third dose as needed for increasing weight or increasing swelling. 60 tablet 0    gabapentin (NEURONTIN) 300 MG capsule Take 1 capsule (300 mg total) by mouth Three (3) times a day. 270 capsule 3    insulin aspart (NOVOLOG U-100 INSULIN ASPART) 100 unit/mL vial Inject 0.01-0.2 mL (1-20 Units  total) under the skin Four (4) times a day (before meals and nightly). SLIDING SCALE 20 mL 1    insulin glargine (BASAGLAR KWIKPEN U-100 INSULIN) 100 unit/mL (3 mL) injection pen Inject 0.18 mL (18 Units total) under the skin nightly. 15 mL 0    ipratropium (ATROVENT) 0.02 % nebulizer solution Inhale 2.5 mL (500 mcg total) by nebulization four (4) times a day. 300 mL 0    levoFLOXacin (LEVAQUIN) 500 MG tablet Take 1 tablet (500 mg total) by mouth daily. First dose this afternoon, ending 8/31 8 tablet 0    levoFLOXacin (LEVAQUIN) 750 MG tablet Take 1 tablet (750 mg total) by mouth daily. 6 tablet 0    OXYGEN-AIR DELIVERY SYSTEMS MISC Dose: 2-3 LPM      posaconazole (NOXAFIL) 100 mg delayed released tablet Take 2 tablets by mouth once daily 60 tablet 0    sildenafiL, pulm.hypertension, (REVATIO) 20 mg tablet Take 1 tablet (20 mg total) by mouth Three (3) times a day. 90 tablet 0    sodium chloride 3 % NEBULIZER solution Inhale 1 vial (4 mL) by nebulization in the morning and 1 vial (4 mL) in the evening. 240 mL 2     No facility-administered medications prior to visit.       {DischargeMedRec:50284}       The following portions of the patient's history were reviewed and updated as appropriate: {AJM History:30453::allergies,current medications,past medical history,past social history,problem list}.       [facility] Components:           Medication adherence and barriers to the treatment plan have been addressed. Opportunities to optimize healthy behaviors have been discussed. Patient / caregiver voiced understanding.      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 A.niger from sputum 08/2016-09/2016.    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    Sarcoidosis 1995    Type 2 diabetes mellitus with hyperglycemia (CMS-HCC) 07/27/2014    Visual impairment     glasses     Past Surgical History:   Procedure Laterality Date    LUNG LOBECTOMY      PR AMPUTATION TOE,MT-P JT Right 12/12/2019    Procedure: Right foot fifth toe amputation at the metatarsophalangeal joint level;  Surgeon: Britt Bottom, DPM;  Location: MA[facility];  Service: Vascular    PR AMPUTATION TOE,MT-P JT Right 06/16/2020    Procedure: Right foot amputation of 3rd toe at mpj level;  Surgeon: Britt Bottom, DPM;  Location: MAIN OR [facility];  Service: Vascular    PR RIGHT HEART CATH O2 SATURATION & CARDIAC OUTPUT N/A 04/29/2019    Procedure: Right Heart Catheterization;  Surgeon: Ruta Hinds  Hessie Dibble, MD;  Location: [facility] CATH;  Service: Cardiology    PR THORACOSCOPY SURG TOT PULM DECORT Left 12/14/2014    Procedure: THORACOSCOPY SURG; W/TOT PULM DECORTIC/PNEUMOLYS;  Surgeon: Evert Kohl, MD;  Location: MAIN OR [facility];  Service: Cardiothoracic    PR THORACOSCOPY W/THERA WEDGE RESEXN INITIAL UNILAT Left 12/03/2014    Procedure: THORACOSCOPY, SURGICAL; WITH THERAPEUTIC WEDGE RESECTION (EG, MASS, NODULE) INITIAL UNILATERAL;  Surgeon: Evert Kohl, MD;  Location: MAIN [facility];  Service: Cardiothoracic       Family History:           Family History   Problem Relation Age of Onset    Dementia Mother     Diabetes Father     Diabetes Brother     Diabetes Maternal Aunt     Sarcoidosis Maternal Aunt     Diabetes Maternal Uncle     Diabetes Paternal Aunt     Diabetes Paternal Uncle        Social History:           Social History     Tobacco Use    Smoking status: Former     Current packs/day: 0.00     Average packs/day: 0.5 packs/day for 9.0 years (4.5 ttl pk-yrs)     Types: Cigarettes     Start date: 05/02/1978     Quit date: 05/03/1987     Years since quitting: 35.5    Smokeless tobacco: Never   Vaping Use    Vaping status: Never Used   Substance Use Topics    Alcohol use: No     Alcohol/week: 0.0 standard drinks of alcohol    Drug use: No       Allergies:           Patient has no known allergies.    Current Medications           Current Outpatient Medications   Medication Sig Dispense Refill    ACCU-CHEK GUIDE GLUCOSE METER Misc 1 Device by Other route Four (4) times a day (before meals and nightly). AS DIRECTED 1 kit 0    albuterol 2.5 mg /3 mL (0.083 %) nebulizer solution Inhale 3 mL (2.5 mg total) by nebulization every six (6) hours as needed for wheezing or shortness of breath (airway clearance/cough). 360 mL 3    albuterol HFA 90 mcg/actuation inhaler Inhale 2 puffs every four (4) hours as needed for wheezing or shortness of breath. 54 g 0    arformoterol (BROVANA) 15 mcg/2 mL Inhale 2 mL (15 mcg total) by nebulization in the morning and 2 mL (15 mcg total) in the evening. 120 mL 2    aspirin (ECOTRIN) 81 MG tablet Take 1 tablet (81 mg total) by mouth daily.      aspirin-caffeine (BAYER BACK AND BODY) 500-32.5 mg Tab Take by mouth. Takes 2 1/2 pills 3-4 times a day as needed.      azithromycin (ZITHROMAX) 250 MG tablet Take 1 tablet (250 mg total) by mouth daily. Start on 9/1 after completing the levofloxacin 90 tablet 0    blood sugar diagnostic Strp by Other route Three (3) times a day. ACCU-CHEK Guide meter 300 strip 1    budesonide (PULMICORT) 0.5 mg/2 mL nebulizer solution Inhale 2 mL (0.5 mg total) by nebulization in the morning and 2 mL (0.5 mg total) in the evening. 120 mL 2    furosemide (LASIX) 80 MG tablet Take 1 tablet (80 mg total) by mouth two (2)  times a day. Take a third dose as needed for increasing weight or increasing swelling. 60 tablet 0    gabapentin (NEURONTIN) 300 MG capsule Take 1 capsule (300 mg total) by mouth Three (3) times a day. 270 capsule 3    insulin aspart (NOVOLOG U-100 INSULIN ASPART) 100 unit/mL vial Inject 0.01-0.2 mL (1-20 Units total) under the skin Four (4) times a day (before meals and nightly). SLIDING SCALE 20 mL 1    insulin glargine (BASAGLAR KWIKPEN U-100 INSULIN) 100 unit/mL (3 mL) injection pen Inject 0.18 mL (18 Units total) under the skin nightly. 15 mL 0    ipratropium (ATROVENT) 0.02 % nebulizer solution Inhale 2.5 mL (500 mcg total) by nebulization four (4) times a day. 300 mL 0    levoFLOXacin (LEVAQUIN) 500 MG tablet Take 1 tablet (500 mg total) by mouth daily. First dose this afternoon, ending 8/31 8 tablet 0    levoFLOXacin (LEVAQUIN) 750 MG tablet Take 1 tablet (750 mg total) by mouth daily. 6 tablet 0    OXYGEN-AIR DELIVERY SYSTEMS MISC Dose: 2-3 LPM      posaconazole (NOXAFIL) 100 mg delayed released tablet Take 2 tablets by mouth once daily 60 tablet 0    sildenafiL, pulm.hypertension, (REVATIO) 20 mg tablet Take 1 tablet (20 mg total) by mouth Three (3) times a day. 90 tablet 0    sodium chloride 3 % NEBULIZER solution Inhale 1 vial (4 mL) by nebulization in the morning and 1 vial (4 mL) in the evening. 240 mL 2     No current facility-administered medications for this visit.          Objective:           Vital Signs  Wt Readings from Last 3 Encounters:   11/11/22 90.1 kg (198 lb 10.2 oz)   10/31/22 84.8 kg (187 lb)   10/22/22 84 kg (185 lb 3 oz)     Temp Readings from Last 3 Encounters:   11/12/22 37 ??C (98.6 ??F) (Axillary)   10/25/22 36.6 ??C (97.9 ??F) (Oral)   09/25/22 37.6 ??C (99.6 ??F) (Oral)     BP Readings from Last 3 Encounters:   11/12/22 140/70   10/31/22 125/73   10/25/22 105/63     Pulse Readings from Last 3 Encounters:   11/12/22 80   10/31/22 79   10/25/22 83     Estimated body mass index is 27.7 kg/m?? as calculated from the following:    Height as of 11/06/22: 180.3 cm (5' 11).    Weight as of 11/11/22: 90.1 kg (198 lb 10.2 oz).  No height and weight on file for this encounter.        Physical Exam  General: Well developed. Well nourished. No marked weight fluctuation.  HEENT:  Normocephalic.  Atraumatic.     Neck:  No thyroid enlargement, carotid bruits  Heart:  Regular rate and rhythm . No murmurs or gallops  Lungs:  No respiratory distress.  Lungs clear to auscultation.  Extremities:  No edema. Peripheral pulses normal  Skin:  Warm, dry, no nonhealing lesions  Neuro:  Non-focal. No obvious weakness.   Psych:  Affect normal, eye contact good, speech clear and coherent.

## 2022-11-29 ENCOUNTER — Ambulatory Visit: Admit: 2022-11-29 | Payer: MEDICARE

## 2022-11-29 ENCOUNTER — Encounter: Admit: 2022-11-29 | Payer: MEDICARE

## 2022-11-29 ENCOUNTER — Encounter: Admit: 2022-11-29 | Payer: MEDICARE | Attending: Infectious Disease

## 2022-11-29 ENCOUNTER — Ambulatory Visit
Admit: 2022-11-29 | Discharge: 2023-01-09 | Disposition: A | Discharge status: Home Health | Payer: MEDICARE | Source: Ambulatory Visit | Admitting: Infectious Disease

## 2022-11-29 ENCOUNTER — Ambulatory Visit: Admit: 2022-11-29 | Discharge: 2022-11-30 | Payer: MEDICARE

## 2022-11-29 ENCOUNTER — Ambulatory Visit: Admit: 2022-11-29 | Discharge: 2022-11-30 | Payer: MEDICARE | Attending: Family | Primary: Family

## 2022-11-29 LAB — BLOOD GAS CRITICAL CARE PANEL, VENOUS
BASE EXCESS VENOUS: 9.6 — ABNORMAL HIGH (ref -2.0–2.0)
CALCIUM IONIZED VENOUS (MG/DL): 4.67 mg/dL (ref 4.40–5.40)
GLUCOSE WHOLE BLOOD: 167 mg/dL (ref 70–179)
HCO3 VENOUS: 35 mmol/L — ABNORMAL HIGH (ref 22–27)
HEMOGLOBIN BLOOD GAS: 14.2 g/dL
LACTATE BLOOD VENOUS: 2 mmol/L — ABNORMAL HIGH (ref 0.5–1.8)
O2 SATURATION VENOUS: 20.5 % — ABNORMAL LOW (ref 40.0–85.0)
PCO2 VENOUS: 62 mmHg — ABNORMAL HIGH (ref 40–60)
PH VENOUS: 7.37 (ref 7.32–7.43)
PO2 VENOUS: <20 mmHg — ABNORMAL LOW (ref 30–55)
POTASSIUM WHOLE BLOOD: 4.2 mmol/L (ref 3.4–4.6)
SODIUM WHOLE BLOOD: 141 mmol/L (ref 135–145)

## 2022-11-29 LAB — BASIC METABOLIC PANEL
ANION GAP: 8 mmol/L (ref 5–14)
BLOOD UREA NITROGEN: 46 mg/dL — ABNORMAL HIGH (ref 9–23)
BUN / CREAT RATIO: 26
CALCIUM: 9.3 mg/dL (ref 8.7–10.4)
CHLORIDE: 98 mmol/L (ref 98–107)
CO2: 31 mmol/L (ref 20.0–31.0)
CREATININE: 1.78 mg/dL — ABNORMAL HIGH
EGFR CKD-EPI (2021) FEMALE: 33 mL/min/{1.73_m2} — ABNORMAL LOW (ref >=60)
GLUCOSE RANDOM: 285 mg/dL — ABNORMAL HIGH (ref 70–179)
POTASSIUM: 4.7 mmol/L (ref 3.4–4.8)
SODIUM: 137 mmol/L (ref 135–145)

## 2022-11-29 LAB — COMPREHENSIVE METABOLIC PANEL
ALBUMIN: 3.5 g/dL (ref 3.4–5.0)
ALKALINE PHOSPHATASE: 89 U/L (ref 46–116)
ALT (SGPT): <7 U/L — ABNORMAL LOW (ref 10–49)
ANION GAP: 8 mmol/L (ref 5–14)
AST (SGOT): 19 U/L (ref <=34)
BILIRUBIN TOTAL: 0.5 mg/dL (ref 0.3–1.2)
BLOOD UREA NITROGEN: 37 mg/dL — ABNORMAL HIGH (ref 9–23)
BUN / CREAT RATIO: 21
CALCIUM: 10 mg/dL (ref 8.7–10.4)
CHLORIDE: 96 mmol/L — ABNORMAL LOW (ref 98–107)
CO2: 33 mmol/L — ABNORMAL HIGH (ref 20.0–31.0)
CREATININE: 1.8 mg/dL — ABNORMAL HIGH
EGFR CKD-EPI (2021) FEMALE: 32 mL/min/{1.73_m2} — ABNORMAL LOW (ref >=60)
GLUCOSE RANDOM: 165 mg/dL (ref 70–179)
POTASSIUM: 4.6 mmol/L (ref 3.4–4.8)
PROTEIN TOTAL: 8 g/dL (ref 5.7–8.2)
SODIUM: 137 mmol/L (ref 135–145)

## 2022-11-29 LAB — BLOOD GAS CRITICAL CARE PANEL, ARTERIAL
BASE EXCESS ARTERIAL: 6.4 — ABNORMAL HIGH (ref -2.0–2.0)
CALCIUM IONIZED ARTERIAL (MG/DL): 4.67 mg/dL (ref 4.40–5.40)
GLUCOSE WHOLE BLOOD: 281 mg/dL — ABNORMAL HIGH (ref 70–179)
HCO3 ARTERIAL: 31 mmol/L — ABNORMAL HIGH (ref 22–27)
HEMOGLOBIN BLOOD GAS: 10.5 g/dL — ABNORMAL LOW
LACTATE BLOOD ARTERIAL: 1.1 mmol/L (ref <1.3)
O2 SATURATION ARTERIAL: 100 % (ref 94.0–100.0)
PCO2 ARTERIAL: 54.5 mmHg — ABNORMAL HIGH (ref 35.0–45.0)
PH ARTERIAL: 7.38 (ref 7.35–7.45)
PO2 ARTERIAL: 258 mmHg — ABNORMAL HIGH (ref 80.0–110.0)
POTASSIUM WHOLE BLOOD: 4.4 mmol/L (ref 3.4–4.6)
SODIUM WHOLE BLOOD: 138 mmol/L (ref 135–145)

## 2022-11-29 LAB — CBC W/ AUTO DIFF
BASOPHILS ABSOLUTE COUNT: 0 10*9/L (ref 0.0–0.1)
BASOPHILS RELATIVE PERCENT: 0.5 %
EOSINOPHILS ABSOLUTE COUNT: 0.3 10*9/L (ref 0.0–0.5)
EOSINOPHILS RELATIVE PERCENT: 3.4 %
HEMATOCRIT: 31.6 % — ABNORMAL LOW (ref 34.0–44.0)
HEMOGLOBIN: 10.5 g/dL — ABNORMAL LOW (ref 11.3–14.9)
LYMPHOCYTES ABSOLUTE COUNT: 0.6 10*9/L — ABNORMAL LOW (ref 1.1–3.6)
LYMPHOCYTES RELATIVE PERCENT: 7.3 %
MEAN CORPUSCULAR HEMOGLOBIN CONC: 33.3 g/dL (ref 32.0–36.0)
MEAN CORPUSCULAR HEMOGLOBIN: 26.8 pg (ref 25.9–32.4)
MEAN CORPUSCULAR VOLUME: 80.4 fL (ref 77.6–95.7)
MEAN PLATELET VOLUME: 8.6 fL (ref 6.8–10.7)
MONOCYTES ABSOLUTE COUNT: 0.8 10*9/L (ref 0.3–0.8)
MONOCYTES RELATIVE PERCENT: 10 %
NEUTROPHILS ABSOLUTE COUNT: 6.7 10*9/L (ref 1.8–7.8)
NEUTROPHILS RELATIVE PERCENT: 78.8 %
PLATELET COUNT: 217 10*9/L (ref 150–450)
RED BLOOD CELL COUNT: 3.93 10*12/L — ABNORMAL LOW (ref 3.95–5.13)
RED CELL DISTRIBUTION WIDTH: 16.5 % — ABNORMAL HIGH (ref 12.2–15.2)
WBC ADJUSTED: 8.5 10*9/L (ref 3.6–11.2)

## 2022-11-29 LAB — HIGH SENSITIVITY TROPONIN I - SERIAL: HIGH SENSITIVITY TROPONIN I: 10 ng/L (ref <=34)

## 2022-11-29 LAB — PHOSPHORUS: PHOSPHORUS: 4.2 mg/dL (ref 2.4–5.1)

## 2022-11-29 LAB — HCG QUANTITATIVE, BLOOD: GONADOTROPIN, CHORIONIC (HCG) QUANT: 5.3 m[IU]/mL

## 2022-11-29 LAB — MAGNESIUM
MAGNESIUM: 1.9 mg/dL (ref 1.6–2.6)
MAGNESIUM: 1.9 mg/dL (ref 1.6–2.6)

## 2022-11-29 LAB — PRO-BNP: PRO-BNP: 3404 pg/mL — ABNORMAL HIGH (ref <=300.0)

## 2022-11-29 MED ADMIN — methylPREDNISolone sodium succinate (PF) (SOLU-Medrol) injection 125 mg: 125 mg | INTRAVENOUS | @ 21:00:00 | Stop: 2022-11-29

## 2022-11-29 MED ADMIN — ipratropium-albuterol (DUO-NEB) 0.5-2.5 mg/3 mL nebulizer solution 3 mL: 3 mL | RESPIRATORY_TRACT | @ 20:00:00 | Stop: 2022-11-29

## 2022-11-29 MED ADMIN — arformoterol (BROVANA) nebulizer solution 15 mcg/2 mL: 15 ug | RESPIRATORY_TRACT

## 2022-11-29 NOTE — Unmapped (Signed)
Karen Barber has a complex medical history requiring 6L of home oxygen. Patient presented to radiology for PET CT scan. Upon arrival, patient was found to have decreased energy and reported difficulty breathing. Initial vitals revealed O2 sats of high 70s on home 6L. Patient was started on 10L of oxygen and sats only improved to low 80s. A non-rebreather mask was then initiated, and patients sats increased to high 90s. After talking with patient, she did not take any of medications this morning because she was getting the PET scan today.    Due to oxygen requirement, patient was transported to the ED for further work-up and management. For future repeat PET, patient should continue to take home medication as prescribed.

## 2022-11-29 NOTE — Unmapped (Signed)
[facility]  Emergency Department Provider Note     ED Clinical Impression     Final diagnoses:   Acute hypoxic respiratory failure (CMS-HCC) (Primary)      Impression, Medical Decision Making, ED Course     Impression: 57 y.o. female with PMH most significant for bronchiectasis type 1 2/2 pulmonary sarcoidosis, HTN, T2DM, COPD, s/p LUL lobectomy, and asthma who presents with hypoxia in the 70s at a PET Scan today and 3 weeks of shortness of breath, all in the setting of a recent admission for acute on chronic hypoxemic respiratory failure due to pneumonia which was complicated by her past medical history from 11/05/2022 to 11/12/2022, as described below.    On exam, patient is nontoxic appearing and in no acute distress. Vital signs are notable for borderline tachycardia to 98 bpm and tachypneic, otherwise normotensive, hemodynamically stable, and afebrile. HRRR. Physical exam is remarkable for patient is on non-rebreather mask. Oxygen saturation in the high 80s. Decreased aeration throughout with some bibasilar crackles. No bilateral lower extremity edema. Patient is talking in short sentences.  Patient placed on high flow nasal cannula.    Initial differential includes but is not limited to pulmonary hypertension, viral illness, pneumonia, amongst multiple etiologies.  Will plan for EKG and troponin.  Will also get basic labs.  Will get a crit care VBG to evaluate for CO2 retention.  Will get a BNP to evaluate for any evidence of heart failure.  Will get a chest x-ray to look for any signs of pneumonia, pneumothorax, pulm edema, pleural effusions.  Considered pulmonary embolism however low suspicion given no evidence of DVT exam seems less consistent.    Will plan to treat the patient with Solu-Medrol and DuoNeb treatment.  Patient will ultimately need to be admitted to the ICU.    ED Course as of 11/29/22 1854   Wed Nov 29, 2022   1640 Chest x-ray independently viewed myself and shows fibrotic changes. Difficult to assess for infection   1643 EKG independently reviewed by myself and shows sinus rhythm with rate of 89 bpm.  Does have first-degree AV block.  Has incomplete right bundle branch block.  Does have nonspecific ST and T wave abnormalities however appears unchanged from EKG from 11/07/2022.  No STEMI.   1701 pH, Venous: 7.37   1701 pCO2, Ven(!): 62   1701 Lactate, Venous(!): 2.0   1701 CMP with reassuring electrolytes, creatinine elevated 1.8, baseline is appears to be around 1.2.  Normal bilirubin and LFTs.   1702 Magnesium: 1.9   1702 Patient is saturating well on high flow nasal cannula.  Appears more comfortable.  She states she feels better.   1716 PRO-BNP(!): 3,404.0   1717 hCG Quant: 5.3   1717 hsTroponin I: 10   1727 CBC with normal WBC, hemoglobin 10.5, normal platelets.   1741 Negative COVID-19, influenza, RSV.   1750 Spoke with the MICU team who will come down evaluate the patient.  They are recommending ABG.        History     Chief Complaint  Chief Complaint   Patient presents with    Hypoxia       HPI   Karen Barber is a 57 y.o. female with past medical history as below who presents with hypoxia. The patient states she has had 3 weeks of shortness of breath in the setting of a recent admission for acute on chronic hypoxemic respiratory failure due to pneumonia which was complicated by her past  medical history from 11/05/2022 to 11/12/2022. At a PET Scan appointment today, the patient was found to be hypoxic to the 70s, but upon interview, she reports that she was non-compliant with her albuterol and nebulizer treatment this morning. Per chart review, the patient was admitted to the hospital recently from the dates noted above. She was discharged with a plan for outpatient double contrast barium swallow to evaluate her esophagus given appearance on CT, location of PNA, and sore throat. She was discharged home with Oxymizer device, new nebulizer, normal saline nebulizer solution, Aerobika, and Anoro daily. She is on 6 L of supplemental oxygen at baseline. Denies fevers or chest pain.     Outside Historian(s): I have obtained additional history/collateral from N/A.    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 A.niger from sputum 08/2016-09/2016.    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    Sarcoidosis 1995    Type 2 diabetes mellitus with hyperglycemia (CMS-HCC) 07/27/2014    Visual impairment     glasses       Past Surgical History:   Procedure Laterality Date    LUNG LOBECTOMY      PR AMPUTATION TOE,MT-P JT Right 12/12/2019    Procedure: Right foot fifth toe amputation at the metatarsophalangeal joint level;  Surgeon: Britt Bottom, DPM;  Location: MAIN OR [facility];  Service: Vascular    PR AMPUTATION TOE,MT-P JT Right 06/16/2020    Procedure: Right foot amputation of 3rd toe at mpj level;  Surgeon: Britt Bottom, DPM;  Location: MAIN OR [facility];  Service: Vascular    PR RIGHT HEART CATH O2 SATURATION & CARDIAC OUTPUT N/A 04/29/2019    Procedure: Right Heart Catheterization;  Surgeon: Rosana Hoes, MD;  Location: [facility] CATH;  Service: Cardiology    PR THORACOSCOPY SURG TOT PULM DECORT Left 12/14/2014    Procedure: THORACOSCOPY SURG; W/TOT PULM DECORTIC/PNEUMOLYS;  Surgeon: Evert Kohl, MD;  Location: MAIN OR [facility];  Service: Cardiothoracic    PR THORACOSCOPY W/THERA WEDGE RESEXN INITIAL UNILAT Left 12/03/2014    Procedure: THORACOSCOPY, SURGICAL; WITH THERAPEUTIC WEDGE RESECTION (EG, MASS, NODULE) INITIAL UNILATERAL;  Surgeon: Evert Kohl, MD;  Location: MAIN OR [facility];  Service: Cardiothoracic       Allergies  Patient has no known allergies.    Family History  Family History   Problem Relation Age of Onset    Dementia Mother     Diabetes Father     Diabetes Brother     Diabetes Maternal Aunt     Sarcoidosis Maternal Aunt  Diabetes Maternal Uncle     Diabetes Paternal Aunt     Diabetes Paternal Uncle        Social History  Social History     Tobacco Use    Smoking status: Former     Current packs/day: 0.00     Average packs/day: 0.5 packs/day for 9.0 years (4.5 ttl pk-yrs)     Types: Cigarettes     Start date: 05/02/1978     Quit date: 05/03/1987     Years since quitting: 35.6    Smokeless tobacco: Never   Vaping Use    Vaping status: Never Used   Substance Use Topics    Alcohol use: No     Alcohol/week: 0.0 standard drinks of alcohol    Drug use: No        Physical Exam     VITAL SIGNS:      Vitals:    11/29/22 1535 11/29/22 1536 11/29/22 1741 11/29/22 1742   BP: 108/75  115/81    Pulse: 98  88 89   Resp: 18  13 15    Temp: 36.1 ??C (97 ??F)      TempSrc: Axillary      SpO2: 90% 90%  96%     Constitutional: Alert and oriented. No acute distress.  Eyes: Conjunctivae are normal.  HEENT: Normocephalic and atraumatic. Conjunctivae clear. No congestion. Moist mucous membranes.   Cardiovascular: Rate as above, regular rhythm. Normal and symmetric distal pulses. Brisk capillary refill. Normal skin turgor.  Respiratory: Patient is on non-rebreather mask.  Tachypneic.  Oxygen saturation in the high 80s. Decreased aeration throughout with some bibasilar crackles.  Gastrointestinal: Soft, non-distended, non-tender.  Genitourinary: Deferred.  Musculoskeletal: Non-tender with normal range of motion in all extremities.  Neurologic: Patient is talking in short sentences. No gross focal neurologic deficits are appreciated. Patient is moving all extremities equally, face is symmetric at rest and with speech.  Skin: Skin is warm, dry and intact. No rash noted.  Psychiatric: Mood and affect are normal. Speech and behavior are normal.     Radiology     XR Chest Portable   Final Result      Extensive fibrotic changes and biapical bulla appear grossly unchanged. Extensive pulmonary fibrosis limits evaluation for superimposed infection.        Pertinent labs & imaging results that were available during my care of the patient were independently interpreted by me and considered in my medical decision making (see chart for details).    Portions of this record have been created using Scientist, clinical (histocompatibility and immunogenetics). Dictation errors have been sought, but may not have been identified and corrected.    Documentation assistance was provided by Ricka Burdock, Scribe on November 29, 2022 at 3:49 PM for Francis Dowse, MD.     Documentation assistance provided by the above mentioned scribe. I was present during the time the encounter was recorded. The information recorded by the scribe was done at my direction and has been reviewed and validated by me.        Francis Dowse, MD  Resident  11/29/22 [phone]

## 2022-11-29 NOTE — Unmapped (Signed)
Code Medic c/o hypoxia. Baseline 6/L N. Found to be satting 70s, now on 10/L NRB and mid 90s.

## 2022-11-30 LAB — BLOOD GAS CRITICAL CARE PANEL, ARTERIAL
BASE EXCESS ARTERIAL: 4.9 — ABNORMAL HIGH (ref -2.0–2.0)
BASE EXCESS ARTERIAL: 4.4 — ABNORMAL HIGH (ref -2.0–2.0)
BASE EXCESS ARTERIAL: 7.7 — ABNORMAL HIGH (ref -2.0–2.0)
BASE EXCESS ARTERIAL: 1.4 (ref -2.0–2.0)
CALCIUM IONIZED ARTERIAL (MG/DL): 4.98 mg/dL (ref 4.40–5.40)
CALCIUM IONIZED ARTERIAL (MG/DL): 4.73 mg/dL (ref 4.40–5.40)
CALCIUM IONIZED ARTERIAL (MG/DL): 4.71 mg/dL (ref 4.40–5.40)
CALCIUM IONIZED ARTERIAL (MG/DL): 4.16 mg/dL — ABNORMAL LOW (ref 4.40–5.40)
GLUCOSE WHOLE BLOOD: 405 mg/dL (ref 70–179)
GLUCOSE WHOLE BLOOD: 340 mg/dL — ABNORMAL HIGH (ref 70–179)
GLUCOSE WHOLE BLOOD: 191 mg/dL — ABNORMAL HIGH (ref 70–179)
GLUCOSE WHOLE BLOOD: 262 mg/dL — ABNORMAL HIGH (ref 70–179)
HCO3 ARTERIAL: 30 mmol/L — ABNORMAL HIGH (ref 22–27)
HCO3 ARTERIAL: 29 mmol/L — ABNORMAL HIGH (ref 22–27)
HCO3 ARTERIAL: 33 mmol/L — ABNORMAL HIGH (ref 22–27)
HCO3 ARTERIAL: 27 mmol/L (ref 22–27)
HEMOGLOBIN BLOOD GAS: 10.1 g/dL — ABNORMAL LOW
HEMOGLOBIN BLOOD GAS: 10.4 g/dL — ABNORMAL LOW
HEMOGLOBIN BLOOD GAS: 10.3 g/dL — ABNORMAL LOW (ref 12.00–16.00)
HEMOGLOBIN BLOOD GAS: 8.5 g/dL — ABNORMAL LOW (ref 12.00–16.00)
LACTATE BLOOD ARTERIAL: 1.2 mmol/L (ref <1.3)
LACTATE BLOOD ARTERIAL: 3 mmol/L — ABNORMAL HIGH (ref <1.3)
LACTATE BLOOD ARTERIAL: 1.7 mmol/L — ABNORMAL HIGH (ref <1.3)
LACTATE BLOOD ARTERIAL: 1.5 mmol/L — ABNORMAL HIGH (ref <1.3)
O2 SATURATION ARTERIAL: 94.3 % (ref 94.0–100.0)
O2 SATURATION ARTERIAL: 96.4 % (ref 94.0–100.0)
O2 SATURATION ARTERIAL: 96.2 % (ref 94.0–100.0)
O2 SATURATION ARTERIAL: 95.3 % (ref 94.0–100.0)
PCO2 ARTERIAL: 49.5 mmHg — ABNORMAL HIGH (ref 35.0–45.0)
PCO2 ARTERIAL: 48.8 mmHg — ABNORMAL HIGH (ref 35.0–45.0)
PCO2 ARTERIAL: 48.6 mmHg — ABNORMAL HIGH (ref 35.0–45.0)
PCO2 ARTERIAL: 43.3 mmHg (ref 35.0–45.0)
PH ARTERIAL: 7.39 (ref 7.35–7.45)
PH ARTERIAL: 7.39 (ref 7.35–7.45)
PH ARTERIAL: 7.44 (ref 7.35–7.45)
PH ARTERIAL: 7.39 (ref 7.35–7.45)
PO2 ARTERIAL: 78.3 mmHg — ABNORMAL LOW (ref 80.0–110.0)
PO2 ARTERIAL: 89.1 mmHg (ref 80.0–110.0)
PO2 ARTERIAL: 78.6 mmHg — ABNORMAL LOW (ref 80.0–110.0)
PO2 ARTERIAL: 78.2 mmHg — ABNORMAL LOW (ref 80.0–110.0)
POTASSIUM WHOLE BLOOD: 4.5 mmol/L (ref 3.4–4.6)
POTASSIUM WHOLE BLOOD: 4 mmol/L (ref 3.4–4.6)
POTASSIUM WHOLE BLOOD: 4.1 mmol/L (ref 3.4–4.6)
POTASSIUM WHOLE BLOOD: 3.1 mmol/L — ABNORMAL LOW (ref 3.4–4.6)
SODIUM WHOLE BLOOD: 145 mmol/L (ref 135–145)
SODIUM WHOLE BLOOD: 137 mmol/L (ref 135–145)
SODIUM WHOLE BLOOD: 138 mmol/L (ref 135–145)
SODIUM WHOLE BLOOD: 142 mmol/L (ref 135–145)

## 2022-11-30 LAB — BASIC METABOLIC PANEL
ANION GAP: 5 mmol/L (ref 5–14)
BLOOD UREA NITROGEN: 55 mg/dL — ABNORMAL HIGH (ref 9–23)
BUN / CREAT RATIO: 34
CALCIUM: 9.7 mg/dL (ref 8.7–10.4)
CHLORIDE: 98 mmol/L (ref 98–107)
CO2: 33 mmol/L — ABNORMAL HIGH (ref 20.0–31.0)
CREATININE: 1.61 mg/dL — ABNORMAL HIGH
EGFR CKD-EPI (2021) FEMALE: 37 mL/min/{1.73_m2} — ABNORMAL LOW (ref >=60)
GLUCOSE RANDOM: 191 mg/dL — ABNORMAL HIGH (ref 70–179)
POTASSIUM: 4.2 mmol/L (ref 3.4–4.8)
SODIUM: 136 mmol/L (ref 135–145)

## 2022-11-30 LAB — CBC W/ AUTO DIFF
BASOPHILS ABSOLUTE COUNT: 0 10*9/L (ref 0.0–0.1)
BASOPHILS RELATIVE PERCENT: 0.2 %
EOSINOPHILS ABSOLUTE COUNT: 0 10*9/L (ref 0.0–0.5)
EOSINOPHILS RELATIVE PERCENT: 0.1 %
HEMATOCRIT: 29.8 % — ABNORMAL LOW (ref 34.0–44.0)
HEMOGLOBIN: 9.7 g/dL — ABNORMAL LOW (ref 11.3–14.9)
LYMPHOCYTES ABSOLUTE COUNT: 0.4 10*9/L — ABNORMAL LOW (ref 1.1–3.6)
LYMPHOCYTES RELATIVE PERCENT: 6.3 %
MEAN CORPUSCULAR HEMOGLOBIN CONC: 32.5 g/dL (ref 32.0–36.0)
MEAN CORPUSCULAR HEMOGLOBIN: 26.1 pg (ref 25.9–32.4)
MEAN CORPUSCULAR VOLUME: 80.2 fL (ref 77.6–95.7)
MEAN PLATELET VOLUME: 8.3 fL (ref 6.8–10.7)
MONOCYTES ABSOLUTE COUNT: 0.1 10*9/L — ABNORMAL LOW (ref 0.3–0.8)
MONOCYTES RELATIVE PERCENT: 1.1 %
NEUTROPHILS ABSOLUTE COUNT: 5.4 10*9/L (ref 1.8–7.8)
NEUTROPHILS RELATIVE PERCENT: 92.3 %
PLATELET COUNT: 188 10*9/L (ref 150–450)
RED BLOOD CELL COUNT: 3.72 10*12/L — ABNORMAL LOW (ref 3.95–5.13)
RED CELL DISTRIBUTION WIDTH: 16.3 % — ABNORMAL HIGH (ref 12.2–15.2)
WBC ADJUSTED: 5.9 10*9/L (ref 3.6–11.2)

## 2022-11-30 LAB — COMPREHENSIVE METABOLIC PANEL
ALBUMIN: 2.8 g/dL — ABNORMAL LOW (ref 3.4–5.0)
ALKALINE PHOSPHATASE: 83 U/L (ref 46–116)
ALT (SGPT): <7 U/L — ABNORMAL LOW (ref 10–49)
ANION GAP: 7 mmol/L (ref 5–14)
AST (SGOT): <8 U/L (ref <=34)
BILIRUBIN TOTAL: 0.4 mg/dL (ref 0.3–1.2)
BLOOD UREA NITROGEN: 56 mg/dL — ABNORMAL HIGH (ref 9–23)
BUN / CREAT RATIO: 31
CALCIUM: 9 mg/dL (ref 8.7–10.4)
CHLORIDE: 96 mmol/L — ABNORMAL LOW (ref 98–107)
CO2: 32 mmol/L — ABNORMAL HIGH (ref 20.0–31.0)
CREATININE: 1.81 mg/dL — ABNORMAL HIGH
EGFR CKD-EPI (2021) FEMALE: 32 mL/min/{1.73_m2} — ABNORMAL LOW (ref >=60)
GLUCOSE RANDOM: 388 mg/dL — ABNORMAL HIGH (ref 70–179)
POTASSIUM: 4.6 mmol/L (ref 3.5–5.1)
PROTEIN TOTAL: 6.8 g/dL (ref 5.7–8.2)
SODIUM: 135 mmol/L (ref 135–145)

## 2022-11-30 LAB — IRON PANEL
IRON SATURATION: 15 % — ABNORMAL LOW (ref 20–55)
IRON: 37 ug/dL — ABNORMAL LOW
TOTAL IRON BINDING CAPACITY: 254 ug/dL (ref 250–425)

## 2022-11-30 LAB — PHOSPHORUS: PHOSPHORUS: 4.2 mg/dL (ref 2.4–5.1)

## 2022-11-30 LAB — PRO-BNP: PRO-BNP: 3931 pg/mL — ABNORMAL HIGH (ref <=300.0)

## 2022-11-30 LAB — MAGNESIUM: MAGNESIUM: 2 mg/dL (ref 1.6–2.6)

## 2022-11-30 MED ADMIN — insulin NPH (HumuLIN,NovoLIN) injection 10 Units: 10 [IU] | SUBCUTANEOUS | @ 18:00:00 | Stop: 2022-11-30

## 2022-11-30 MED ADMIN — gabapentin (NEURONTIN) capsule 300 mg: 300 mg | ORAL | @ 18:00:00

## 2022-11-30 MED ADMIN — furosemide (LASIX) injection 40 mg: 40 mg | INTRAVENOUS | @ 18:00:00

## 2022-11-30 MED ADMIN — levoFLOXacin (LEVAQUIN) tablet 750 mg: 750 mg | ORAL | @ 13:00:00 | Stop: 2022-12-08

## 2022-11-30 MED ADMIN — acetaminophen (TYLENOL) tablet 650 mg: 650 mg | ORAL | @ 22:00:00

## 2022-11-30 MED ADMIN — gabapentin (NEURONTIN) capsule 300 mg: 300 mg | ORAL | @ 02:00:00

## 2022-11-30 MED ADMIN — sodium chloride 3 % NEBULIZER solution 4 mL: 4 mL | RESPIRATORY_TRACT | @ 02:00:00

## 2022-11-30 MED ADMIN — sildenafiL (pulm.hypertension) (REVATIO) tablet 20 mg: 20 mg | ORAL | @ 02:00:00

## 2022-11-30 MED ADMIN — insulin NPH (HumuLIN,NovoLIN) injection 5 Units: 5 [IU] | SUBCUTANEOUS | @ 05:00:00 | Stop: 2022-11-30

## 2022-11-30 MED ADMIN — budesonide (PULMICORT) nebulizer solution 0.5 mg: .5 mg | RESPIRATORY_TRACT | @ 12:00:00

## 2022-11-30 MED ADMIN — posaconazole (NOXAFIL) delayed released tablet 200 mg: 200 mg | ORAL | @ 13:00:00

## 2022-11-30 MED ADMIN — furosemide (LASIX) injection 40 mg: 40 mg | INTRAVENOUS | @ 13:00:00

## 2022-11-30 MED ADMIN — calcium carbonate (TUMS) chewable tablet 200 mg elem calcium: 200 mg | ORAL | @ 09:00:00

## 2022-11-30 MED ADMIN — sildenafiL (pulm.hypertension) (REVATIO) tablet 20 mg: 20 mg | ORAL | @ 18:00:00

## 2022-11-30 MED ADMIN — cefepime (MAXIPIME) 2 g in sodium chloride 0.9 % (NS) 100 mL IVPB-MBP: 2 g | INTRAVENOUS | @ 02:00:00 | Stop: 2022-12-04

## 2022-11-30 MED ADMIN — insulin regular (HumuLIN,NovoLIN) injection 8 Units: 8 [IU] | SUBCUTANEOUS | @ 11:00:00 | Stop: 2022-11-30

## 2022-11-30 MED ADMIN — insulin lispro (HumaLOG) injection 0-20 Units: 0-20 [IU] | SUBCUTANEOUS | @ 02:00:00

## 2022-11-30 MED ADMIN — budesonide (PULMICORT) nebulizer solution 0.5 mg: .5 mg | RESPIRATORY_TRACT | @ 01:00:00

## 2022-11-30 MED ADMIN — furosemide (LASIX) injection 40 mg: 40 mg | INTRAVENOUS | @ 02:00:00 | Stop: 2022-11-29

## 2022-11-30 MED ADMIN — insulin lispro (HumaLOG) injection 0-20 Units: 0-20 [IU] | SUBCUTANEOUS | @ 15:00:00

## 2022-11-30 MED ADMIN — ipratropium (ATROVENT) 0.02 % nebulizer solution 500 mcg: 500 ug | RESPIRATORY_TRACT | @ 11:00:00 | Stop: 2022-11-30

## 2022-11-30 MED ADMIN — sildenafiL (pulm.hypertension) (REVATIO) tablet 20 mg: 20 mg | ORAL | @ 13:00:00

## 2022-11-30 MED ADMIN — enoxaparin (LOVENOX) syringe 40 mg: 40 mg | SUBCUTANEOUS | @ 02:00:00

## 2022-11-30 MED ADMIN — azithromycin (ZITHROMAX) tablet 250 mg: 250 mg | ORAL | @ 13:00:00 | Stop: 2025-08-25

## 2022-11-30 MED ADMIN — ipratropium (ATROVENT) 0.02 % nebulizer solution 500 mcg: 500 ug | RESPIRATORY_TRACT | @ 02:00:00

## 2022-11-30 MED ADMIN — vancomycin (VANCOCIN) 1750 mg in sodium chloride (NS) 0.9% 500 mL IVPB: 1750 mg | INTRAVENOUS | @ 07:00:00 | Stop: 2022-11-30

## 2022-11-30 MED ADMIN — insulin NPH (HumuLIN,NovoLIN) injection 15 Units: 15 [IU] | SUBCUTANEOUS | @ 11:00:00 | Stop: 2022-11-30

## 2022-11-30 MED ADMIN — arformoterol (BROVANA) nebulizer solution 15 mcg/2 mL: 15 ug | RESPIRATORY_TRACT | @ 12:00:00

## 2022-11-30 MED ADMIN — sodium chloride 3 % NEBULIZER solution 4 mL: 4 mL | RESPIRATORY_TRACT | @ 13:00:00

## 2022-11-30 MED ADMIN — ipratropium (ATROVENT) 0.02 % nebulizer solution 500 mcg: 500 ug | RESPIRATORY_TRACT | @ 16:00:00 | Stop: 2022-11-30

## 2022-11-30 MED ADMIN — gabapentin (NEURONTIN) capsule 300 mg: 300 mg | ORAL | @ 13:00:00

## 2022-11-30 MED ADMIN — insulin regular (HumuLIN,NovoLIN) injection 5 Units: 5 [IU] | SUBCUTANEOUS | @ 18:00:00 | Stop: 2022-11-30

## 2022-11-30 NOTE — Unmapped (Addendum)
OCCUPATIONAL THERAPY  Evaluation (11/30/22 0840)    Patient Name:  Karen Barber       Medical Record Number: [id number]     Date of Birth: [birth year]  Sex: Female      Post-Discharge Occupational Therapy Recommendations: 3x weekly, Supervision needed for safety with basic self-care routines, Supervision needed for safety with functional transfers, Would benefit from assistance with IADLs/ADLs   IADL/ADLS Needing Assistance: Tub/shower transfers, Housekeeping, Meal Preparation, LB Dressing, Toileting, Bathing      Equipment Recommendation  OT DME Recommendations: None (has shower chair with back rest and BSC)       OT Treatment Diagnosis: Generalized muscle weakness, Limitation of activities due to disability, Need for assistance with personal care, Reduced mobility      Assessment  Problem List: Decreased strength, Decreased activity tolerance, Decreased endurance, Impaired ADLs, Fall risk, Decreased mobility, Shortness of breath    Personal Factors/Comorbidities (Occupational Profile and History Review): Expanded (Moderate)    Assessment of Occupational Performance : Balance, Endurance, Mobility, Strength  Clinical Decision Making: Moderate Complexity    Assessment: Karen Barber is a 57 y.o. female with a past medical history of bronchiectasis type 1 in the setting of pulmonary sarcoidosis (6L [place] home O2), HTN, T2DM, COPD, s/p LUL lobectomy, and asthma that initially presented today to the hospital for a PET scan to evaluate her sarcoidosis. To prepare for her appointment, she states that she was told to not take any of her home medicines, including her home sidenafil, lasix, nebulizers, and inhalers. Upon presentation to the appointment, she was found to have an increased work of breathing and her SPO2 was in the 70s on her home [facility], prompting her to be evaluated in the ED. As her respiratory status did not improve with NRB, she was placed on HFNC. 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 Of note, the patient had a recent admission for pneumonia from 11/05/2022 to 11/12/2022, during which she was followed by the pulm consult team. While the patient was discharged on her baseline O2 need, the patient states that she never really returned to her baseline respiratory status. She endorses having ongoing shortness of breath since discharge and stated that she frequently would increase the flow of her [place] at home. In the ED, the patient was without a fever or leukocytosis, lactate was non-elevated, electrolytes were within an appropriate range. She denied any recent fevers, chest pain, and no lower extremity leg swelling was appreciated. She received one dose of 125mg  of IV methylprednisolone and then was transferred to the MICU for further management of her hypoxia. Patient presents to OT this date below her baseline functioning, demonstrating increased fatigue, decreased endurance/activity tolerance, and increased O2 supplemental needs. Patient will benefit from skilled acute OT services to address her deficits and to maximize her functional potential. Patient is appropriate for 3x/week post-acute OT services, though will benefit from assistance for mobility, self-care tasks, and IADL tasks (which patient reports her brother can provide).    Today's Interventions: Educated on role of OT and OT POC, PLB, energy conservation. Patient performed occupational profile assistance, UE and LE assessment, LB dressing, bed mobility, functional transfer    Activity Tolerance During Today's Session  Tolerated treatment well, Limited by fatigue    Plan  Planned Frequency of Treatment: Plan of Care Initiated: 11/30/22  1-2x per day for: 3-4x week  Planned Treatment Duration: 12/14/22    Planned Interventions:  Self-Care/Home Training, Therapeutic Exercise, Education (Patient/Family/Caregiver), Therapeutic Activity, Home Exercise  Program      GOALS:   Patient and Family Goals: To walk out of here    Short Term:   SHORT GOAL #1: Patient will perform toilet transfer and toileting using LRAD as needed with mod(I)   Time Frame : 2 weeks  SHORT GOAL #2: Patient will perform full body bathing using LRAD as needed with mod(I)   Time Frame : 2 weeks  SHORT GOAL #3: Patient will tolerate 5+ minutes of standing ADL tasks using LRAD as needed with mod(I)   Time Frame : 2 weeks    Long Term Goal #1: Patient will score 24/24 on the AMPAC within 4 weeks  Time Frame: 4 weeks    Prognosis:  Good  Positive Indicators:  PLOF; motivation  Barriers to Discharge: Endurance deficits, Inability to safely perform ADLS    Subjective  Medical Updates Since Last Visit/Relevant PMH Affecting Clinical Decision Making: N/A  Prior Functional Status Patient reports that she is mod(I) at baseline for mobility and self-care tasks. She reports that she uses 6L O2 via [place] at baseline, though has had increased SOB and WOB since her last hospital discharge. Patient's partner is currently in rehab (patient reports for almost the last year), so she resides in her home by herself; however, patient's brother and SIL live nearby and her brother comes over multiple times per day to assist patient with IADLs (grocery shopping, cooking, cleaning, laundry). Patient reports that since her last hospital discharge, she spends most of her days watching TV, and had 1 fall ~4 weeks ago where she slipped off her bed when attempting to sit. Patient has mobility DME at home, but reports that she typically doesn't use her RW or rollator; patient only uses a BSC for toileting and a shower chair with a back rest for showering. Patient does report that her brother would be able to move in with her at discharge, if needed, though her brother is currently the caretaker for their father (who has dementia).    Medical Tests / Procedures: Reviewed       Patient / Caregiver reports: Patient and RN agreeable to OT session. During session, patient stated I feel better.      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 A.niger from sputum 08/2016-09/2016.    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    Sarcoidosis 1995    Type 2 diabetes mellitus with hyperglycemia (CMS-HCC) 07/27/2014    Visual impairment     glasses    Social History     Tobacco Use    Smoking status: Former     Current packs/day: 0.00     Average packs/day: 0.5 packs/day for 9.0 years (4.5 ttl pk-yrs)     Types: Cigarettes  Start date: 05/02/1978     Quit date: 05/03/1987     Years since quitting: 35.6    Smokeless tobacco: Never   Substance Use Topics    Alcohol use: No     Alcohol/week: 0.0 standard drinks of alcohol      Past Surgical History:   Procedure Laterality Date    LUNG LOBECTOMY      PR AMPUTATION TOE,MT-P JT Right 12/12/2019    Procedure: Right foot fifth toe amputation at the metatarsophalangeal joint level;  Surgeon: Britt Bottom, DPM;  Location: MAIN OR [facility];  Service: Vascular    PR AMPUTATION TOE,MT-P JT Right 06/16/2020    Procedure: Right foot amputation of 3rd toe at mpj level;  Surgeon: Britt Bottom, DPM;  Location: MAIN OR [facility];  Service: Vascular    PR RIGHT HEART CATH O2 SATURATION & CARDIAC OUTPUT N/A 04/29/2019    Procedure: Right Heart Catheterization;  Surgeon: Rosana Hoes, MD;  Location: [facility] CATH;  Service: Cardiology    PR THORACOSCOPY SURG TOT PULM DECORT Left 12/14/2014    Procedure: THORACOSCOPY SURG; W/TOT PULM DECORTIC/PNEUMOLYS;  Surgeon: Evert Kohl, MD;  Location: MAIN OR [facility];  Service: Cardiothoracic    PR THORACOSCOPY W/THERA WEDGE RESEXN INITIAL UNILAT Left 12/03/2014    Procedure: THORACOSCOPY, SURGICAL; WITH THERAPEUTIC WEDGE RESECTION (EG, MASS, NODULE) INITIAL UNILATERAL;  Surgeon: Evert Kohl, MD;  Location: MAIN OR [facility];  Service: Cardiothoracic    Family History   Problem Relation Age of Onset    Dementia Mother     Diabetes Father     Diabetes Brother     Diabetes Maternal Aunt     Sarcoidosis Maternal Aunt     Diabetes Maternal Uncle     Diabetes Paternal Aunt     Diabetes Paternal Uncle         Patient has no known allergies.     Objective Findings  Precautions / Restrictions  Falls precautions       Weight Bearing  Non-applicable    Required Braces or Orthoses  Non-applicable    Communication Preference  Verbal       Pain  Patient denied pain during session    Equipment / Environment  Vascular access (PIV, TLC, Port-a-cath, PICC), Supplemental oxygen, Telemetry, Arterial line, Purewick/Condom catheter (on HFNC 45L 50%)    Living Situation  Living Environment: House  Lives With: Alone (significant other is in rehab; brother and SIL live [place]  Home Living: One level home, Standard height toilet, Walk-in shower, Tub/shower unit, Shower chair with back, Raised toilet seat with rails, Stairs to enter with rails  Rail placement (outside): Bilateral rails in reach (rails are like grab bars)  Number of Stairs to Enter (outside): 2  Caregiver Identified?: Yes (brother and SIL)  Caregiver Availability: Intermittent  Caregiver Ability: Limited lifting  Equipment available at home: Rollator, Rolling walker, Bedside commode (electric scooters, shower chair with back rest)     Cognition   Orientation Level:  Oriented x 4   Arousal/Alertness:  Appropriate responses to stimuli   Attention Span:  Appears intact   Memory:  Appears intact   Following Commands: Follows all commands and directions without difficulty   Safety Judgment:  Good awareness of safety precautions   Awareness of Errors and Problem Solving:  Able to problem solve independently    Vision / Hearing   Vision: Glasses not present, No acute deficits identified, Wears glasses for reading only  Vision Comments: Patient denies new  onset of blurry vision or double-vision  Hearing: No deficit identified     Hand Function:  Right Hand Function: Right hand grip strength, ROM and coordination WNL  Left Hand Function: Left hand grip strength, ROM and coordination WNL  Hand Dominance: Right    Skin Inspection:  Skin Inspection: Intact where visualized    Face/Cervical ROM:  Face ROM: WFL  Cervical ROM: WFL    ROM / Strength:  UE ROM/Strength: Right Impaired/Limited, Left Impaired/Limited  RUE Impairment: Reduced strength  LUE Impairment: Reduced strength  LE ROM/Strength: Right Impaired/Limited, Left Impaired/Limited  RLE Impairment: Reduced strength  LLE Impairment: Reduced strength    Coordination:  Coordination: WFL    Sensation:  RUE Sensation: RUE intact  LUE Sensation: LUE intact  RLE Sensation: RLE intact  LLE Sensation: LLE intact    Balance:  Static Sitting-Level of Assistance: Independent  Dynamic Sitting-Level of Assistance: Supervision  Sitting Balance comments: Patient able to sit cross-legged in bed without assistance for trunk support    Static Standing-Level of Assistance: Supervision  Dynamic Standing - Level of Assistance: Stand by assistance  Standing Balance comments: no AD    Functional Mobility  Transfers: Supervision (sit<>stand from EOB)  Bed Mobility - Needs Assistance: Modified Independent  Ambulation: Patient performed stand-step transfer EOB>recliner chair using no AD with supervision to SBA for balance/safety.    ADLs  ADLs: Needs assistance with ADLs  ADLs - Needs Assistance: Grooming, Toileting, Bathing, LB dressing, UB dressing  Grooming - Needs Assistance: Set Up Assist, Performed seated  Bathing - Needs Assistance: Min assist, Performed seated  Toileting - Needs Assistance: Min assist, Performed seated, Performed standing  UB Dressing - Needs Assistance: Set Up Assist, Performed seated  LB Dressing - Needs Assistance: Set Up Assist, Performed seated (socks; able to obtain Figure-4 position without issue)  IADLs: Not trialed    Vitals / Orthostatics  Vitals/Orthostatics: Start of session - HR: 68, BP: 117/66, MAP: 83, RR: 17. Patient satting at 86-87% sustained on 45L 50% HFNC at OT entry and reporting fatigue; HFNC titrated to 45L 83% for recovery to mid-to-upper 90s for mobility with improved energy levels. End of session - HR: 72, BP: 116/60, MAP: 80, O2: 98%. Patient left on 45L 83% HFNC with RT present giving breathing treatment at OT exit. Patient denied any dizziness or SOB during session.    Patient at end of session: All needs in reach, In chair, Lines intact, Nurse notified, Staff present (RT present)     Occupational Therapy Session Duration  OT Individual [mins]: 10  OT Co-Treatment [mins]: 24 (with Dyanne Carrel, PT)  Reason for Co-treatment: To safely progress mobility       AM-PAC-Daily Activity  Lower Body Dressing assistance needs: A Little - Minimal/Contact Guard Assist/Supervision  Bathing assistance needs: A Little - Minimal/Contact Guard Assist/Supervision  Toileting assistance needs: A Little - Minimal/Contact Guard Assist/Supervision  Upper Body Dressing assistance needs: A Little - Minimal/Contact Guard Assist/Supervision  Personal Grooming assistance needs: A Little - Minimal/Contact Guard Assist/Supervision  Eating Meals assistance needs: None - Modified Independent/Independent    Daily Activity Score: 19    Score (in points): % of Functional Impairment, Limitation, Restriction  6: 100% impaired, limited, restricted  7-8: At least 80%, but less than 100% impaired, limited restricted  9-13: At least 60%, but less than 80% impaired, limited restricted  14-19: At least 40%, but less than 60% impaired, limited restricted  20-22: At least 20%, but less than 40% impaired, limited  restricted  23: At least 1%, but less than 20% impaired, limited restricted  24: 0% impaired, limited restricted    I attest that I have reviewed the above information.  Signed: Macario Carls, OT  [facility] 11/30/2022

## 2022-11-30 NOTE — Unmapped (Signed)
ADVANCE CARE PLANNING NOTE    Discussion Date:  November 30, 2022    Patient has decisional capacity:  Yes    Patient has selected a Health Care Decision-Maker if loses capacity: Yes    Health Care Decision Maker as of 11/30/2022    HCDM (patient stated preference): Karen Barber - Domestic Partner [phone]    Discussion Participants:  Myself and patient    Communication of Medical Status/Prognosis:   Discussed her underlying health issues as well as her current (and recurrent) hospitalizations recently. I explained extensively what being put on a ventilator would look like, and why, for her in particular, this was a risky procedure. She is aware that her overall prognosis/life expectancy is fairly limited, and would not want to be put on a ventilator both because of the initial risks of intubation but also the difficulty getting liberated from the ventilator. Additionally, I explained what CPR entails and she declined those measures as well. She does not want heroic measures if she worsens.     Communication of Treatment Goals/Options:   As above    Treatment Decisions:   Continue treatment as we are. DNR/DNI          I spent 15 minutes providing voluntary advance care planning services for this patient.

## 2022-11-30 NOTE — Unmapped (Signed)
MICU ULTRASOUND PIV PLACEMENT BY RN NOTE    Indications:  Poor venous access.  No superficial veins were found on assessment for landmark attempt or landmark attempts were unsuccessful prior to ultrasound assessment.     Ultrasound guidance was utilized in attempt to gain access.    Procedure Details:    Identity of the patient via name, medical record number and date of birth was confirmed.    Patients renal assessment was performed:   Yes, eGFR 33.      Current medication regimen was reviewed.    Pharmacy guidelines were reviewed for target veins deeper than .5 cm or subfascial.     The availability of the correct equipment was verified. The vein was identified for ultrasound catheter insertion.    Field was prepared with necessary supplies and equipment. Probe cover and sterile gel were utilized. Insertion site was prepped with Chlorhexidinesolution and allowed to dry. The catheter extension was primed with normal saline.      Measurement (without tourniquet)  Vein diameter: 0.33 cm  Vein depth: 0.5 cm     A(n) 20 1.75catheter was selected based on the recommendations below.                              [facility] Catheter/Vein Ratio Guidelines      Chart to determine PIV catheter size/length to use based on vein diameter and depth   Catheter Gauge Size (g)  22g 20g 18g                            Catheter length (inches)  1.75 1.75 1.75   Catheter diameter measurement (mm) 0.5mm 0.45mm 1.24mm                 Minimum required vein diameter       Sonosite cm 0.27cm 0.33cm 0.39cm          Maximum vein depth  1.25 cm 1.25 cm 1.25 cm                     The field was prepared with necessary supplies and equipment.  Probe cover and sterile gel utilized. Insertion site was prepped with chlorhexidine and allowed to dry.  The catheter extension was primed with normal saline.  The [place] PIV was placed in the right forearm with 1 attempt(s).     Catheter aspirated, 3 mL blood return present. The catheter was then flushed with 10 mL of normal saline. Insertion site cleansed, and dressing applied per manufacturer guidelines. The catheter was inserted without difficulty  by Melodie Bouillon, RN.    Thank you,     Melodie Bouillon, RN Ultrasound Resource Nurse

## 2022-11-30 NOTE — Unmapped (Signed)
Vancomycin Therapeutic Monitoring Pharmacy Note    Karen Barber is a 57 y.o. female starting vancomycin. Date of therapy initiation: 11/29/22    Indication: Pneumonia and Suspected infection     Prior Dosing Information: None/new initiation     Goals:  Therapeutic Drug Levels  Vancomycin trough goal: 10-15 mg/L    Additional Clinical Monitoring/Outcomes  Renal function, volume status (intake and output)    Results: Not applicable    Wt Readings from Last 1 Encounters:   11/11/22 90.1 kg (198 lb 10.2 oz)     Creatinine   Date Value Ref Range Status   11/29/2022 1.80 (H) 0.55 - 1.02 mg/dL Final   13/10/6576 4.69 (H) 0.55 - 1.02 mg/dL Final   62/95/2841 3.24 (H) 0.55 - 1.02 mg/dL Final        Pharmacokinetic Considerations and Significant Drug Interactions:  Adult (estimated initial): Vd = 59.8 L, ke = 0.039 hr-1  Concurrent nephrotoxic meds: not applicable    Assessment/Plan:  Recommendation(s)  Start vancomycin 1750 mg x 1, followed by 1250 mg Q24H  Estimated trough on recommended regimen:  13.7 mg/L    Follow-up  Level due: prior to fourth or fifth dose  A pharmacist will continue to monitor and order levels as appropriate    Please page service pharmacist with questions/clarifications.    Orlan Leavens, PharmD

## 2022-11-30 NOTE — Unmapped (Signed)
Arterial Line Insertion Procedure Note     Date of Service: 11/29/2022    Patient Name:: Karen Barber  Patient MRN: [id number]    Indications: Hemodynamic monitoring    Consent:   I explained the potential benefits and risks of the procedure, including  temporary vascular occlusion, thrombosis, ischemia, hematoma formation, local/catheter-related infection and nerve/tissue damage. I explained potential alternatives. The patient/HCDM understands these risks, agrees to the procedure, and signed the informed consent form.    Procedure Details:   Time-out was performed immediately prior to the procedure to verify correct patient, procedure, site, positioning, and special equipment if applicable.    Freida Busman???s test was performed to ensure adequate perfusion. The patient???s left wrist was prepped and draped in sterile fashion. 1% Lidocaine was used to anesthetize the area. A 20 G Arrow line was introduced into the radial artery with ultrasound guidance. The catheter was threaded over the guide wire and the needle was removed with appropriate pulsatile blood return. The catheter was then sutured in place to the skin and a sterile CHG dressing applied. Perfusion to the extremity distal to the point of catheter insertion was checked and found to be adequate.  A pressure transducer was connected sterilely to the arterial line and an arterial line waveform was noted on the monitor.     Estimated Blood Loss: 3 ml    Condition:  The patient tolerated the procedure well and remains in the same condition as pre-procedure.    Complications:  The patient tolerated the procedure well and there were no complications.    Plan:  Continue with frequent ABGs    Vella Colquitt Orion Modest, AGNP

## 2022-11-30 NOTE — Unmapped (Signed)
MICU Progress Note     Date of Service: 11/30/2022    Problem List:   Principal Problem:    Acute on chronic hypoxic respiratory failure (CMS-HCC)  Active Problems:    Pulmonary sarcoidosis (CMS-HCC)    Type 2 diabetes mellitus with hyperglycemia (CMS-HCC)    COPD (chronic obstructive pulmonary disease) (CMS-HCC)    Chronic pulmonary aspergillosis (CMS-HCC)    Pulmonary hypertension (CMS-HCC)    AKI (acute kidney injury) (CMS-HCC)      HPI: Karen Barber is a 57 y.o. female with a past medical history of bronchiectasis type 1 in the setting of pulmonary sarcoidosis (6L [place] home O2), HTN, T2DM, COPD, s/p LUL lobectomy, and asthma that initially presented today to the hospital for a PET scan to evaluate her sarcoidosis. To prepare for her appointment, she states that she was told to not take any of her home medicines, including her home sidenafil, lasix, nebulizers, and inhalers.     Upon presentation to the appointment, she was found to have an increased work of breathing and her SPO2 was in the 70s on her home [facility], prompting her to be evaluated in the ED. As her respiratory status did not improve with NRB, she was placed on HFNC. 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     Of note, the patient had a recent admission for pneumonia from 11/05/2022 to 11/12/2022, during which she was followed by the pulm consult team. While the patient was discharged on her baseline O2 need, the patient states that she never really returned to her baseline respiratory status. She endorses having ongoing shortness of breath since discharge and stated that she frequently would increase the flow of her [place] at home.     In the ED, the patient was without a fever or leukocytosis, lactate was non-elevated, electrolytes were within an appropriate range. She denied any recent fevers, chest pain, and no lower extremity leg swelling was appreciated. She received one dose of 125mg  of IV methylprednisolone and then was transferred to the MICU for further management of her hypoxia.     24hr events:   - FiO2 requirement improved since admission     Neurological   Chronic Pain   - Continuing home gabapentin   - PT/OT consulted, appreciate recs     Analgesia: No pain issues  RASS at goal? N/A, not on sedation  [place] Agitation Assessment Scale (RASS) : 0 (11/30/2022  8:00 AM)       Pulmonary   Acute on chronic hypoxemic respiratory failure - Pulmonary Sarcoidosis - COPD  Patient with history of COPD, pulmonary sarcoidosis, and PAH (on 6L [place] at baseline) that presented to the hospital 9/11 with SPO2 in the 70s on home O2. To assist with her hypoxia, she was placed on HFNC. 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 Of note, patient endorses increasing the flow of her [place] frequently when at home. This is a new change after her recent admission for pneumonia.   - Continue to wean O2 as able. May need to consider a new O2 setting for home. --> 50% HFNC  - Goal SPO2 > 90%  - Continue home AVAPS at night   - Continue home airway clearance in the setting of bronchiectasis (Aerobika and HTS neb). Of note, during previous admission, also used Pari Proneb Max (see note from Dr. Stasia Cavalier on 8/24). May need to consider.   - Continue home pulmicort, brovana, atrovent and home PRN albuterol   - CXR as  above   - Received a one time dose of 125mg  of IV methylpred in the ED. May need to continue         FiO2 (%):  [50 %-100 %] 50 %  O2 Device: Heated high flow nasal cannula  O2 Flow Rate (L/min):  [8 L/min-55 L/min] 45 L/min    Cardiovascular   Pulmonary hypertension - RV Failure  Patient with history of PAH. Last echo on 10/23/22 showing LVEF with normal size but increased wall thickness, LVED at > 55%, severe pulmonary HTN, RV severely dilated in size with reduced systolic function, RA mildly dilated.   - Can consider repeat Echo   - Trend daily BNP   - At home takes lasix 80mg  BID. Plan was to reconsider dose post last admission. Holding home lasix for now and continuing with spot IV diuresis. (Given 1x dose of 40mg  IV on 9/11)  - Continue home sidenafil 40mg  TID  - Strict Is/Os and daily weights   - started lasix 40 bid     Renal   AKI on CKD3  Patient with baseline creatinine between 1.2-1.4. Admitted with creatinine at 1.8, likely in the setting of hypoxemia.   - Consider placement of foley catheter for closer I/O monitoring   - Monitoring electrolytes and creatinine with daily BMP   - Renally dose medications, avoid nephrotoxic agents     Infectious Disease/Autoimmune   Potential for Respiratory Infection   Patient without fever, leukocytosis, lactate elevation, however, presented with increased work of breathing, shortness of breath, and hypoxemia. CXR showing extensive pulmonary fibrosis, but unable to clearly evaluate for presence of pneumonia on film. Of note, patient admitted from 8/18-8/25 with Serratia pneumonia. There was concern for potential chronic aspiration. Planned to have outpatient barium swallow to evaluate esophagus.   - Started on vanc/cefepime for broad coverage --> stopped vanc and cefepime, started levaquin  - MRSA screen neg  - RPP neg   - Will send off sputum sample if able     Chronic pulmonary aspergillosis   Continue home posa ppx    Cultures:  Blood Culture, Routine (no units)   Date Value   11/05/2022 No Growth at 5 days   11/05/2022 No Growth at 5 days     Lower Respiratory Culture (no units)   Date Value   11/08/2022 3+ Serratia marcescens (A)   11/08/2022 1+ Oropharyngeal Flora Isolated     WBC (10*9/L)   Date Value   11/30/2022 5.9          FEN/GI   Nutrition:   - reg diet    - No indication for GI ppx at this time   - Miralax daily and senna PRN   - barium swallow pending for prior c/f aspiration events    Provider Malnutrition Assessment:  Body mass index is 25.89 kg/m??.  GLIM criteria:   Pt does not meet criteria  -I have screened this patient for malnutrition and they did NOT meet criteria for malnutrition based on GLIM criteria.  -Nutrition consulted no    Heme/Coag   Chronic Anemia, likely IDA   Iron deficiency anemia is associated with pulmonary hypertension in multiple studies.   - H/H up from last admission, appropriate at this time   - Platelets appropiate   - Continue lovenox ppx (may need to switch to heparin if AKI worsens)   - Repeating iron studies     Endocrine   Type 2 diabetes mellitus with hyperglycemia   HgbA1c 8.3%  July 2024, Home regimen of glargine 18 units nightly, sliding scale insulin.  - NPH 15 bid w/ SSI, low threshold to increase    Integumentary   NAI  - WOCN consulted for high risk skin assessment No. Reason: not indicated .  - cont pressure mitigating precautions per skin policy    Prophylaxis/LDA/Restraints/Consults   ICU Checklist completed: yes (see ICU rounding navigator in Epic)      Patient Lines/Drains/Airways Status       Active Active Lines, Drains, & Airways       Name Placement date Placement time Site Days    External Urinary Device 11/06/22 With Suction 11/06/22  0800  -- 24    Peripheral IV 11/29/22 Left;Posterior Hand 11/29/22  1947  Hand  less than 1    Peripheral IV 11/30/22 Right Forearm 11/30/22  0300  Forearm  less than 1    Arterial Line 11/29/22 Left Radial 11/29/22  2108  Radial  less than 1                  Patient Lines/Drains/Airways Status       Active Wounds       None                    Goals of Care     Code Status:  Full Code.     Designated Chiropodist Maker:  Ms. Hashemi current decisional capacity for healthcare decision-making is Full capacity. Her designated Educational psychologist) is/are   HCDM (patient stated preference): [name] - Media planner - [phone].      Subjective     HPI:  ABCDE SPEZIA is a 57 y.o. female with a past medical history of bronchiectasis type 1 in the setting of pulmonary sarcoidosis (6L [place] home O2), HTN, T2DM, COPD, s/p LUL lobectomy, and asthma that initially presented today to the hospital for a PET scan to evaluate her sarcoidosis. To prepare for her appointment, she states that she was told to not take any of her home medicines, including her home sidenafil, lasix, nebulizers, and inhalers.     Upon presentation to the appointment, she was found to have an increased work of breathing and her SPO2 was in the 70s on her home [facility], prompting her to be evaluated in the ED. As her respiratory status did not improve with NRB, she was placed on HFNC. 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     Of note, the patient had a recent admission for pneumonia from 11/05/2022 to 11/12/2022, during which she was followed by the pulm consult team. While the patient was discharged on her baseline O2 need, the patient states that she never really returned to her baseline respiratory status. She endorses having ongoing shortness of breath since discharge and stated that she frequently would increase the flow of her [place] at home.     In the ED, the patient was without a fever or leukocytosis, lactate was non-elevated, electrolytes were within an appropriate range. She denied any recent fevers, chest pain, and no lower extremity leg swelling was appreciated. She received one dose of 125mg  of IV methylprednisolone and then was transferred to the MICU for further management of her hypoxia.    ROS: Ten-point review of systems is obtained and is negative except as noted in HPI.    Allergies  No Known Allergies    Meds  No current facility-administered medications on file prior to encounter.     Current  Outpatient Medications on File Prior to Encounter   Medication Sig    ACCU-CHEK GUIDE GLUCOSE METER Misc 1 Device by Other route Four (4) times a day (before meals and nightly). AS DIRECTED    albuterol 2.5 mg /3 mL (0.083 %) nebulizer solution Inhale 3 mL (2.5 mg total) by nebulization every six (6) hours as needed for wheezing or shortness of breath (airway clearance/cough).    albuterol HFA 90 mcg/actuation inhaler Inhale 2 puffs every four (4) hours as needed for wheezing or shortness of breath.    arformoterol (BROVANA) 15 mcg/2 mL Inhale 2 mL (15 mcg total) by nebulization in the morning and 2 mL (15 mcg total) in the evening.    aspirin (ECOTRIN) 81 MG tablet Take 1 tablet (81 mg total) by mouth daily.    aspirin-caffeine (BAYER BACK AND BODY) 500-32.5 mg Tab Take by mouth. Takes 2 1/2 pills 3-4 times a day as needed.    azithromycin (ZITHROMAX) 250 MG tablet Take 1 tablet (250 mg total) by mouth daily. Start on 9/1 after completing the levofloxacin    blood sugar diagnostic Strp by Other route Three (3) times a day. ACCU-CHEK Guide meter    budesonide (PULMICORT) 0.5 mg/2 mL nebulizer solution Inhale 2 mL (0.5 mg total) by nebulization in the morning and 2 mL (0.5 mg total) in the evening.    furosemide (LASIX) 80 MG tablet Take 1 tablet (80 mg total) by mouth two (2) times a day. Take a third dose as needed for increasing weight or increasing swelling.    gabapentin (NEURONTIN) 300 MG capsule Take 1 capsule (300 mg total) by mouth Three (3) times a day.    insulin aspart (NOVOLOG U-100 INSULIN ASPART) 100 unit/mL vial Inject 0.01-0.2 mL (1-20 Units total) under the skin Four (4) times a day (before meals and nightly). SLIDING SCALE    insulin glargine (BASAGLAR KWIKPEN U-100 INSULIN) 100 unit/mL (3 mL) injection pen Inject 0.18 mL (18 Units total) under the skin nightly.    ipratropium (ATROVENT) 0.02 % nebulizer solution Inhale 2.5 mL (500 mcg total) by nebulization four (4) times a day.    posaconazole (NOXAFIL) 100 mg delayed released tablet Take 2 tablets by mouth once daily    sildenafiL, pulm.hypertension, (REVATIO) 20 mg tablet Take 1 tablet (20 mg total) by mouth Three (3) times a day.    sodium chloride 3 % NEBULIZER solution Inhale 1 vial (4 mL) by nebulization in the morning and 1 vial (4 mL) in the evening.    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 A.niger from sputum 08/2016-09/2016.    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  Sarcoidosis 1995    Type 2 diabetes mellitus with hyperglycemia (CMS-HCC) 07/27/2014    Visual impairment     glasses       Past Surgical History  Past Surgical History:   Procedure Laterality Date    LUNG LOBECTOMY      PR AMPUTATION TOE,MT-P JT Right 12/12/2019    Procedure: Right foot fifth toe amputation at the metatarsophalangeal joint level;  Surgeon: Britt Bottom, DPM;  Location: MAIN OR [facility];  Service: Vascular    PR AMPUTATION TOE,MT-P JT Right 06/16/2020    Procedure: Right foot amputation of 3rd toe at mpj level;  Surgeon: Britt Bottom, DPM;  Location: MAIN OR [facility];  Service: Vascular    PR RIGHT HEART CATH O2 SATURATION & CARDIAC OUTPUT N/A 04/29/2019    Procedure: Right Heart Catheterization;  Surgeon: Rosana Hoes, MD;  Location: [facility] CATH;  Service: Cardiology    PR THORACOSCOPY SURG TOT PULM DECORT Left 12/14/2014    Procedure: THORACOSCOPY SURG; W/TOT PULM DECORTIC/PNEUMOLYS;  Surgeon: Evert Kohl, MD;  Location: MAIN [facility];  Service: Cardiothoracic    PR THORACOSCOPY W/THERA WEDGE RESEXN INITIAL UNILAT Left 12/03/2014    Procedure: THORACOSCOPY, SURGICAL; WITH THERAPEUTIC WEDGE RESECTION (EG, MASS, NODULE) INITIAL UNILATERAL;  Surgeon: Evert Kohl, MD;  Location: MAIN OR [facility]; Service: Cardiothoracic       Family History  Family History   Problem Relation Age of Onset    Dementia Mother     Diabetes Father     Diabetes Brother     Diabetes Maternal Aunt     Sarcoidosis Maternal Aunt     Diabetes Maternal Uncle     Diabetes Paternal Aunt     Diabetes Paternal Uncle        Social History  Social History     Socioeconomic History    Marital status: Single   Tobacco Use    Smoking status: Former     Current packs/day: 0.00     Average packs/day: 0.5 packs/day for 9.0 years (4.5 ttl pk-yrs)     Types: Cigarettes     Start date: 05/02/1978     Quit date: 05/03/1987     Years since quitting: 35.6    Smokeless tobacco: Never   Vaping Use    Vaping status: Never Used   Substance and Sexual Activity    Alcohol use: No     Alcohol/week: 0.0 standard drinks of alcohol    Drug use: No    Sexual activity: Yes   Other Topics Concern    Do you use sunscreen? No    Tanning bed use? No    Are you easily burned? No    Excessive sun exposure? No    Blistering sunburns? No     Social Determinants of Health     Financial Resource Strain: Low Risk  (11/06/2022)    Overall Financial Resource Strain (CARDIA)     Difficulty of Paying Living Expenses: Not hard at all   Food Insecurity: No Food Insecurity (11/06/2022)    Hunger Vital Sign     Worried About Running Out of Food in the Last Year: Never true     Ran Out of Food in the Last Year: Never true   Transportation Needs: No Transportation Needs (11/06/2022)    PRAPARE - Therapist, art (Medical): No     Lack of Transportation (Non-Medical): No  Objective     Vitals - past 24 hours  Temp:  [36.1 ??C (97 ??F)-36.5 ??C (97.7 ??F)] 36.3 ??C (97.4 ??F)  Heart Rate:  [73-102] 74  SpO2 Pulse:  [73-103] 74  Resp:  [13-37] 22  BP: (89-141)/(56-81) 100/65  FiO2 (%):  [50 %-100 %] 50 %  SpO2:  [80 %-100 %] 98 % Intake/Output  I/O last 3 completed shifts:  In: 100 [IV Piggyback:100]  Out: -      Physical Exam:    General: sitting upright in bed, tachypneic, in clear resp distress   HEENT: head atraumatic, normocephalic. PERRL. Sclera non-icteric.   CV: regular rhythm. No m/g/r. Capillary refill less than 3 seconds in all extremities.   Pulm: breath sounds diminished throughout lung fields bilaterally, mild crackles noted in bases bilaterally. Chest expansion symmetrical. No wheezing.   GI: Abdomen is soft/non-tender/non-distended. Active bowel sounds in all quadrants.   MSK: ROM full in all extremities.   Skin: No rashes of lesions noted   Neuro: Alert, oriented to person/place/time, follows commands in all extremities.     The patient's hospital stay has been complicated by the following clinically significant conditions requiring additional evaluation and treatment or having a significant effect of this patient's care: - Anemia POA requiring further investigation or monitoring  - Chronic kidney disease POA requiring further investigation, treatment, or monitoring    Body mass index is 25.89 kg/m??.            Wt Readings from Last 12 Encounters:   11/30/22 84.2 kg (185 lb 10 oz)   11/11/22 90.1 kg (198 lb 10.2 oz)   10/31/22 84.8 kg (187 lb)   10/22/22 84 kg (185 lb 3 oz)   09/25/22 83.9 kg (185 lb)   06/12/22 89.8 kg (198 lb)   05/16/22 89.4 kg (197 lb)   06/29/21 94.8 kg (208 lb 15.9 oz)   04/19/21 94.8 kg (209 lb)   11/02/20 98 kg (216 lb)   07/20/20 97.1 kg (214 lb)   06/08/20 97.1 kg (214 lb)       Continuous Infusions:       Scheduled Medications:    arformoterol  15 mcg Nebulization BID (RT)    azithromycin  250 mg Oral Daily    budesonide  0.5 mg Nebulization BID (RT)    enoxaparin (LOVENOX) injection  40 mg Subcutaneous Nightly    furosemide  40 mg Intravenous BID    gabapentin  300 mg Oral TID    insulin lispro  0-20 Units Subcutaneous ACHS    insulin NPH  15 Units Subcutaneous Q12H SCH    ipratropium  500 mcg Nebulization QID    levoFLOXacin  750 mg Oral Every other day    polyethylene glycol  17 g Oral Daily    posaconazole  200 mg Oral Daily sildenafiL (pulm.hypertension)  20 mg Oral TID    sodium chloride  4 mL Nebulization BID       PRN medications:  albuterol, calcium carbonate, dextrose in water, glucagon, glucose, ondansetron **OR** ondansetron, senna, sodium chloride    Data/Imaging Review: Reviewed in Epic and personally interpreted on 11/30/2022. See EMR for detailed results.       Critical Care Attestation     This patient is critically ill or injured with the impairment of vital organ systems such that there is a high probability of imminent or life threatening deterioration in the patient's condition. This patient must remain in the ICU for ongoing evaluation of the  comprehensive management plan outlined in this note. I directly provided critical care services as documented in this note and the critical care time spent (45 min) is exclusive of separately billable procedures.  In addition to time spent for critical care management, I also provided advance care planning services for  0  minutes (see GOC above or ACP note for details)???. Total billable critical care time 45 minutes.    Daryel November, ACNP

## 2022-11-30 NOTE — Unmapped (Signed)
Pt A&Ox4. Pt up in chair for most of the shift. Pt weaned to 40L/45%. Pt tolerating good PO intake. Pt voiding with purewick, see I/0. Sugars closely monitored and insulin given as ordered. Will continue to monitor and support.   Problem: Adult Inpatient Plan of Care  Goal: Plan of Care Review  Outcome: Progressing  Goal: Patient-Specific Goal (Individualized)  Outcome: Progressing  Goal: Absence of Hospital-Acquired Illness or Injury  Outcome: Progressing  Intervention: Identify and Manage Fall Risk  Recent Flowsheet Documentation  Taken 11/30/2022 0800 by Tonita Phoenix, RN  Safety Interventions: environmental modification  Intervention: Prevent Skin Injury  Recent Flowsheet Documentation  Taken 11/30/2022 1600 by Tonita Phoenix, RN  Positioning for Skin: Sitting in Chair  Device Skin Pressure Protection: positioning supports utilized  Skin Protection: adhesive use limited  Taken 11/30/2022 1400 by Tonita Phoenix, RN  Positioning for Skin: Sitting in Chair  Device Skin Pressure Protection: positioning supports utilized  Skin Protection: adhesive use limited  Taken 11/30/2022 1200 by Tonita Phoenix, RN  Positioning for Skin: Sitting in Chair  Taken 11/30/2022 1000 by Tonita Phoenix, RN  Positioning for Skin: Sitting in Chair  Taken 11/30/2022 0800 by Tonita Phoenix, RN  Positioning for Skin: Right  Device Skin Pressure Protection: positioning supports utilized  Skin Protection: adhesive use limited  Intervention: Prevent and Manage VTE (Venous Thromboembolism) Risk  Recent Flowsheet Documentation  Taken 11/30/2022 0800 by Tonita Phoenix, RN  VTE Prevention/Management: anticoagulant therapy  Intervention: Prevent Infection  Recent Flowsheet Documentation  Taken 11/30/2022 0800 by Tonita Phoenix, RN  Infection Prevention: rest/sleep promoted  Goal: Optimal Comfort and Wellbeing  Outcome: Progressing  Goal: Readiness for Transition of Care  Outcome: Progressing  Goal: Rounds/Family Conference  Outcome: Progressing     Problem: Adult Inpatient Plan of Care  Goal: Optimal Comfort and Wellbeing  Outcome: Progressing     Problem: Adult Inpatient Plan of Care  Goal: Readiness for Transition of Care  Outcome: Progressing

## 2022-11-30 NOTE — Unmapped (Signed)
Patient ICU status, alert and oriented. Admission assessment complete.    Patient  complained of indigestion  received PRN  TUMS , with positive effect.      Patient's heart rate NSR, blood pressure systolic 100-150s, afebrile, on  HFNC weaned from 80% to 50% overnight .    Patient  has diet orders, only had crackers to eat , voiding via purewick. Urine output 750 mL. Patient did not have a BM tonight.    Patient able to turn self, received SQ lovenox for blood clot prevention.      Problem: Adult Inpatient Plan of Care  Goal: Plan of Care Review  Outcome: Progressing  Goal: Patient-Specific Goal (Individualized)  Outcome: Progressing  Goal: Absence of Hospital-Acquired Illness or Injury  Outcome: Progressing  Intervention: Identify and Manage Fall Risk  Recent Flowsheet Documentation  Taken 11/29/2022 2000 by Ceasar Mons, RN  Safety Interventions:   lighting adjusted for tasks/safety   family at bedside   low bed   room near unit station  Intervention: Prevent Skin Injury  Recent Flowsheet Documentation  Taken 11/29/2022 2000 by Ceasar Mons, RN  Skin Protection:   adhesive use limited   incontinence pads utilized  Intervention: Prevent and Manage VTE (Venous Thromboembolism) Risk  Recent Flowsheet Documentation  Taken 11/29/2022 2000 by Ceasar Mons, RN  VTE Prevention/Management: anticoagulant therapy  Intervention: Prevent Infection  Recent Flowsheet Documentation  Taken 11/29/2022 2000 by Denny Lave, Laqueta Due, RN  Infection Prevention:   cohorting utilized   rest/sleep promoted   single patient room provided  Goal: Optimal Comfort and Wellbeing  Outcome: Progressing  Goal: Readiness for Transition of Care  Outcome: Progressing  Goal: Rounds/Family Conference  Outcome: Progressing     Problem: Fall Injury Risk  Goal: Absence of Fall and Fall-Related Injury  Outcome: Progressing  Intervention: Promote Injury-Free Environment  Recent Flowsheet Documentation  Taken 11/29/2022 2000 by Ceasar Mons, RN  Safety Interventions:   lighting adjusted for tasks/safety   family at bedside   low bed   room near unit station     Problem: Gas Exchange Impaired  Goal: Optimal Gas Exchange  Outcome: Progressing  Intervention: Optimize Oxygenation and Ventilation  Recent Flowsheet Documentation  Taken 11/29/2022 2000 by Aleese Kamps, Laqueta Due, RN  Head of Bed [facility]) Positioning: HOB at 30-45 degrees

## 2022-11-30 NOTE — Unmapped (Addendum)
Care Management  Initial Transition Planning Assessment              Patient lives with Beverely Pace (brother) in [facility]) in a 1 level home with 2 steps to enter.  At baseline patient is independent with ADLs. She dose not receives home health services f. Patient stated that brother help her with cooking, cleaning, and taking to appoitnments. DME is [employer]. Patient was at 6 liter of oxygen  continuous at baseline.  Beverely Pace (brother) will provide transportation and will assist with basic care at home.     General  Care Manager assessed the patient by : In person interview with patient, Discussion with Clinical Care team, Medical record review  Orientation Level: Oriented X4  Functional level prior to admission: Partially Assisted  Who provides care at home?: Family member  Level of assistance required: Eating, Grooming, Transferring  Reason for referral: Discharge Planning    Contact/Decision Maker  Extended Emergency Contact Information  Primary Emergency Contact: Austin,Ira  Address: [address]           [place], [place] [address] [place]  Home Phone: [phone]  Relation: Domestic Partner  Secondary Emergency Contact: Morel,Bryant  Address: [place], [place] [address] [place]  Mobile Phone: [phone]  Relation: Brother    Armed forces operational officer Next of Kin / Guardian / POA / Advance Directives     HCDM (patient stated preference): [name] - Domestic Partner [phone]    HCDM (patient stated preference): [place]ak,Bryant - Brother - [phone]    Advance Directive (Medical Treatment)  Does patient have an advance directive covering medical treatment?: Patient does not have advance directive covering medical treatment.  Reason patient does not have an advance directive covering medical treatment:: Patient does not wish to complete one at this time.    Health Care Decision Maker [HCDM] (Medical & Mental Health Treatment)  Healthcare Decision Maker: HCDM documented in the HCDM/Contact Info section.  Information offered on HCDM, Medical & Mental Health advance directives:: Patient declined information.    Advance Directive (Mental Health Treatment)  Does patient have an advance directive covering mental health treatment?: Patient does not have advance directive covering mental health treatment.  Reason patient does not have an advance directive covering mental health treatment:: Patient does not wish to complete one at this time.    Readmission Information    Have you been hospitalized in the last 30 days?: Yes  Name of Hospital: [facility]  Were you being cared for at a skilled nursing facility:: No     What day were you discharged from that hospital or facility?: 11/12/22  Number of Days between previous discharge and readmission date: 15-30 days    Type of Readmission: Related to Previous Admission    Readmission Source: Home       Did the following happen with your discharge?    Did you receive a follow-up/transition call?: Yes  from whom?: Mayford Knife, Pincus Large, RN  Did you get your discharge medications?  : Yes       Did you go to your scheduled follow up appointment with your doctor on discharge?: Yes        Did you understand your discharge instructions?: Yes       Contributing Factors for Readmission: Advancement of Chronic Disease    Did you feel prepared for discharge?: Yes        Patient Information  Lives with: Family members (Brother Loss adjuster, chartered)    Type of Residence: Private residence        Location/Detail: [address] [place] [address]    Support Systems/Concerns: Family Members Arlys John brother)    Responsibilities/Dependents at home?: No    Home Care services in place prior to admission?: No  Type of Home Care services in place prior to admission: DME or oxygen  Current Home Care provider (Name/Phone #): Christoper Allegra  [phone]       Equipment Currently Used at Home: commode chair, grab bar, tub/shower, oxygen, raised toilet seat, respiratory supplies, shower chair, walker, rolling       Currently receiving outpatient dialysis?: No       Financial Information       Need for financial assistance?: No       Social Determinants of Health  Social Determinants of Health     Financial Resource Strain: Low Risk  (11/06/2022)    Overall Financial Resource Strain (CARDIA)     Difficulty of Paying Living Expenses: Not hard at all   Internet Connectivity: Not on file   Food Insecurity: No Food Insecurity (11/06/2022)    Hunger Vital Sign     Worried About Running Out of Food in the Last Year: Never true     Ran Out of Food in the Last Year: Never true   Tobacco Use: Medium Risk (11/30/2022)    Patient History     Smoking Tobacco Use: Former     Smokeless Tobacco Use: Never     Passive Exposure: Not on file   Housing/Utilities: Low Risk  (11/06/2022)    Housing/Utilities     Within the past 12 months, have you ever stayed: outside, in a car, in a tent, in an overnight shelter, or temporarily in someone else's home (i.e. couch-surfing)?: No     Are you worried about losing your housing?: No     Within the past 12 months, have you been unable to get utilities (heat, electricity) when it was really needed?: No   Alcohol Use: Not At Risk (06/12/2022)    Alcohol Use     How often do you have a drink containing alcohol?: Never     How many drinks containing alcohol do you have on a typical day when you are drinking?: Not on file     How often do you have 5 or more drinks on one occasion?: Never   Transportation Needs: No Transportation Needs (11/06/2022)    PRAPARE - Transportation     Lack of Transportation (Medical): No     Lack of Transportation (Non-Medical): No   Substance Use: Low Risk  (06/12/2022)    Substance Use     Taken prescription drugs for non-medical reasons: Never     Taken illegal drugs: Never     Patient indicated they have taken drugs in the past year for non-medical reasons: Yes, [positive answer(s)]: Not on file   Health Literacy: Low Risk  (08/07/2019) Health Literacy     : Never   Physical Activity: Not on file   Interpersonal Safety: Unknown (11/30/2022)    Interpersonal Safety     Unsafe Where You Currently Live: Not on file     Physically Hurt by Anyone: Not on file     Abused by Anyone: Not on file   Stress: Not on file   Intimate Partner Violence: Not on file   Depression: Not at risk (11/02/2020)    PHQ-2  PHQ-2 Score: 0   Social Connections: Not on file       Complex Discharge Information    Is patient identified as a difficult/complex discharge?: No       Discharge Needs Assessment  Concerns to be Addressed: other (see comments) (CM will follow for DME needs.)    Clinical Risk Factors: Multiple Diagnoses (Chronic), Functional Limitations, New Diagnosis    Barriers to taking medications: No    Prior overnight hospital stay or ED visit in last 90 days: Yes       Anticipated Changes Related to Illness: other (see comments) (Likely need time to recover before resuming usual activities)    Equipment Needed After Discharge: other (see comments) (CM will follow for DME needs.)    Discharge Facility/Level of Care Needs: other (see comments) (CM will follow for DC needs.)    Readmission  Risk of Unplanned Readmission Score: UNPLANNED READMISSION SCORE: 23.26%  Predictive Model Details          23% (High)  Factor Value    Calculated 11/30/2022 12:03 25% Number of active inpatient medication orders 42    [place] Risk of Unplanned Readmission Model 15% Number of ED visits in last six months 3     8% Number of hospitalizations in last year 2     8% ECG/EKG order present in last 6 months     7% Latest BUN high (56 mg/dL)     6% Imaging order present in last 6 months     5% Latest hemoglobin low (9.7 g/dL)     5% Phosphorous result present     4% Age 57     4% Active anticoagulant inpatient medication order present     4% Active corticosteroid inpatient medication order present     4% Latest creatinine high (1.81 mg/dL)     3% Charlson Comorbidity Index 3     2% Future appointment scheduled     1% Current length of stay 0.722 days      Readmitted Within the Last 30 Days? (No if blank) Yes  Patient at risk for readmission?: Yes    Discharge Plan  Screen findings are: Discharge planning needs identified or anticipated (Comment).    Expected Discharge Date: 12/13/2022    Expected Transfer from Critical Care: 12/04/22       Initial Assessment complete?: Yes

## 2022-11-30 NOTE — Unmapped (Signed)
MICU History & Physical     Date of Service: 11/29/2022    Problem List:   Principal Problem:    Acute on chronic hypoxic respiratory failure (CMS-HCC)  Active Problems:    Pulmonary sarcoidosis (CMS-HCC)    Type 2 diabetes mellitus with hyperglycemia (CMS-HCC)    COPD (chronic obstructive pulmonary disease) (CMS-HCC)    Chronic pulmonary aspergillosis (CMS-HCC)    Pulmonary hypertension (CMS-HCC)    AKI (acute kidney injury) (CMS-HCC)      HPI: Karen Barber is a 57 y.o. female with a past medical history of bronchiectasis type 1 in the setting of pulmonary sarcoidosis (6L [place] home O2), HTN, T2DM, COPD, s/p LUL lobectomy, and asthma that initially presented today to the hospital for a PET scan to evaluate her sarcoidosis. To prepare for her appointment, she states that she was told to not take any of her home medicines, including her home sidenafil, lasix, nebulizers, and inhalers.     Upon presentation to the appointment, she was found to have an increased work of breathing and her SPO2 was in the 70s on her home [facility], prompting her to be evaluated in the ED. As her respiratory status did not improve with NRB, she was placed on HFNC. 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     Of note, the patient had a recent admission for pneumonia from 11/05/2022 to 11/12/2022, during which she was followed by the pulm consult team. While the patient was discharged on her baseline O2 need, the patient states that she never really returned to her baseline respiratory status. She endorses having ongoing shortness of breath since discharge and stated that she frequently would increase the flow of her [place] at home.     In the ED, the patient was without a fever or leukocytosis, lactate was non-elevated, electrolytes were within an appropriate range. She denied any recent fevers, chest pain, and no lower extremity leg swelling was appreciated. She received one dose of 125mg  of IV methylprednisolone and then was transferred to the MICU for further management of her hypoxia.     24hr events: as above    Neurological   Chronic Pain   - Continuing home gabapentin   - PT/OT consulted, appreciate recs     Analgesia: No pain issues  RASS at goal? N/A, not on sedation  [place] Agitation Assessment Scale (RASS) : 0 (11/11/2022  8:00 PM)       Pulmonary   Acute on chronic hypoxemic respiratory failure - Pulmonary Sarcoidosis - COPD  Patient with history of COPD, pulmonary sarcoidosis, and PAH (on 6L [place] at baseline) that presented to the hospital 9/11 with SPO2 in the 70s on home O2. To assist with her hypoxia, she was placed on HFNC. 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 Of note, patient endorses increasing the flow of her  frequently when at home. This is a new change after her recent admission for pneumonia.   - Continue to wean O2 as able. May need to consider a new O2 setting for home.   - Goal SPO2 > 90%  - Continue home AVAPS at night   - Continue home airway clearance in the setting of bronchiectasis (Aerobika and HTS neb). Of note, during previous admission, also used Pari Proneb Max (see note from Dr. Stasia Cavalier on 8/24). May need to consider.   - Continue home pulmicort, brovana, atrovent and home PRN albuterol   - CXR as above   - Received a one time  dose of 125mg  of IV methylpred in the ED. May need to continue   - Will need PET once appropriate       FiO2 (%):  [80 %-100 %] 80 %  O2 Device: Heated high flow nasal cannula  O2 Flow Rate (L/min):  [8 L/min-35 L/min] 35 L/min    Cardiovascular   Pulmonary hypertension - RV Failure  Patient with history of PAH. Last echo on 10/23/22 showing LVEF with normal size but increased wall thickness, LVED at > 55%, severe pulmonary HTN, RV severely dilated in size with reduced systolic function, RA mildly dilated.   - Can consider repeat Echo   - Trend daily BNP   - At home takes lasix 80mg  BID. Plan was to reconsider dose post last admission. Holding home lasix for now and continuing with spot IV diuresis. (Given 1x dose of 40mg  IV on 9/11)  - Continue home sidenafil 40mg  TID  - Strict Is/Os and daily weights     Renal   AKI on CKD3  Patient with baseline creatinine between 1.2-1.4. Admitted with creatinine at 1.8, likely in the setting of hypoxemia.   - Consider placement of foley catheter for closer I/O monitoring   - Monitoring electrolytes and creatinine with daily BMP   - Renally dose medications, avoid nephrotoxic agents     Infectious Disease/Autoimmune   Potential for Respiratory Infection   Patient without fever, leukocytosis, lactate elevation, however, presented with increased work of breathing, shortness of breath, and hypoxemia. CXR showing extensive pulmonary fibrosis, but unable to clearly evaluate for presence of pneumonia on film. Of note, patient admitted from 8/18-8/25 with Serratia pneumonia. There was concern for potential esophageal fistula leading to chronic aspiration. Sent home with azithromycin prophylaxis and planned to have outpatient barium swallow to evaluate esophagus.   - Started on vanc/cefepime for broad coverage   - MRSA screen pending   - RPP pending   - Can consider repeat CT chest to better eval lungs  - Will send off sputum sample if able     Chronic pulmonary aspergillosis   Continue home posa ppx    Cultures:  Blood Culture, Routine (no units)   Date Value   11/05/2022 No Growth at 5 days   11/05/2022 No Growth at 5 days     Lower Respiratory Culture (no units)   Date Value   11/08/2022 3+ Serratia marcescens (A)   11/08/2022 1+ Oropharyngeal Flora Isolated     WBC (10*9/L)   Date Value   11/29/2022 8.5          FEN/GI   Nutrition:   - NPO for now given increased O2 needs and work of breathing   - No indication for GI ppx at this time   - Miralax daily and senna PRN     Provider Malnutrition Assessment:  There is no height or weight on file to calculate BMI.  GLIM criteria:   Pt does not meet criteria  -I have screened this patient for malnutrition and they did NOT meet criteria for malnutrition based on GLIM criteria.  -Nutrition consulted no    Heme/Coag   Chronic Anemia, likely IDA   Iron deficiency anemia is associated with pulmonary hypertension in multiple studies.   - H/H up from last admission, appropriate at this time   - Platelets appropiate   - Continue lovenox ppx (may need to switch to heparin if AKI worsens)   - Repeating iron studies     Endocrine  Type 2 diabetes mellitus with hyperglycemia   HgbA1c 8.3% July 2024, Home regimen of glargine 18 units nightly, sliding scale insulin.  - Placed on 5U NPH and sliding scale for now, may need additional coverage     Integumentary   NAI  - WOCN consulted for high risk skin assessment No. Reason: not indicated .  - cont pressure mitigating precautions per skin policy    Prophylaxis/LDA/Restraints/Consults   ICU Checklist completed: yes (see ICU rounding navigator in Epic)      Patient Lines/Drains/Airways Status       Active Active Lines, Drains, & Airways       Name Placement date Placement time Site Days    External Urinary Device 11/06/22 With Suction 11/06/22  0800  -- 23                  Patient Lines/Drains/Airways Status       Active Wounds       None                    Goals of Care     Code Status:  Full Code.     Designated Chiropodist Maker:  Karen Barber current decisional capacity for healthcare decision-making is Full capacity. Her designated Educational psychologist) is/are   HCDM (patient stated preference): [name] - Media planner - [phone].      Subjective     HPI:  Karen Barber is a 57 y.o. female with a past medical history of bronchiectasis type 1 in the setting of pulmonary sarcoidosis (6L [place] home O2), HTN, T2DM, COPD, s/p LUL lobectomy, and asthma that initially presented today to the hospital for a PET scan to evaluate her sarcoidosis. To prepare for her appointment, she states that she was told to not take any of her home medicines, including her home sidenafil, lasix, nebulizers, and inhalers.     Upon presentation to the appointment, she was found to have an increased work of breathing and her SPO2 was in the 70s on her home [facility], prompting her to be evaluated in the ED. As her respiratory status did not improve with NRB, she was placed on HFNC. 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     Of note, the patient had a recent admission for pneumonia from 11/05/2022 to 11/12/2022, during which she was followed by the pulm consult team. While the patient was discharged on her baseline O2 need, the patient states that she never really returned to her baseline respiratory status. She endorses having ongoing shortness of breath since discharge and stated that she frequently would increase the flow of her [place] at home.     In the ED, the patient was without a fever or leukocytosis, lactate was non-elevated, electrolytes were within an appropriate range. She denied any recent fevers, chest pain, and no lower extremity leg swelling was appreciated. She received one dose of 125mg  of IV methylprednisolone and then was transferred to the MICU for further management of her hypoxia.    ROS: Ten-point review of systems is obtained and is negative except as noted in HPI.    Allergies  No Known Allergies    Meds  No current facility-administered medications on file prior to encounter.     Current Outpatient Medications on File Prior to Encounter   Medication Sig    ACCU-CHEK GUIDE GLUCOSE METER Misc 1 Device by Other route Four (4) times a day (before meals and nightly). AS DIRECTED  albuterol 2.5 mg /3 mL (0.083 %) nebulizer solution Inhale 3 mL (2.5 mg total) by nebulization every six (6) hours as needed for wheezing or shortness of breath (airway clearance/cough).    albuterol HFA 90 mcg/actuation inhaler Inhale 2 puffs every four (4) hours as needed for wheezing or shortness of breath.    arformoterol (BROVANA) 15 mcg/2 mL Inhale 2 mL (15 mcg total) by nebulization in the morning and 2 mL (15 mcg total) in the evening.    aspirin (ECOTRIN) 81 MG tablet Take 1 tablet (81 mg total) by mouth daily.    aspirin-caffeine (BAYER BACK AND BODY) 500-32.5 mg Tab Take by mouth. Takes 2 1/2 pills 3-4 times a day as needed.    azithromycin (ZITHROMAX) 250 MG tablet Take 1 tablet (250 mg total) by mouth daily. Start on 9/1 after completing the levofloxacin    blood sugar diagnostic Strp by Other route Three (3) times a day. ACCU-CHEK Guide meter    budesonide (PULMICORT) 0.5 mg/2 mL nebulizer solution Inhale 2 mL (0.5 mg total) by nebulization in the morning and 2 mL (0.5 mg total) in the evening.    furosemide (LASIX) 80 MG tablet Take 1 tablet (80 mg total) by mouth two (2) times a day. Take a third dose as needed for increasing weight or increasing swelling.    gabapentin (NEURONTIN) 300 MG capsule Take 1 capsule (300 mg total) by mouth Three (3) times a day.    insulin aspart (NOVOLOG U-100 INSULIN ASPART) 100 unit/mL vial Inject 0.01-0.2 mL (1-20 Units total) under the skin Four (4) times a day (before meals and nightly). SLIDING SCALE    insulin glargine (BASAGLAR KWIKPEN U-100 INSULIN) 100 unit/mL (3 mL) injection pen Inject 0.18 mL (18 Units total) under the skin nightly.    ipratropium (ATROVENT) 0.02 % nebulizer solution Inhale 2.5 mL (500 mcg total) by nebulization four (4) times a day.    posaconazole (NOXAFIL) 100 mg delayed released tablet Take 2 tablets by mouth once daily    sildenafiL, pulm.hypertension, (REVATIO) 20 mg tablet Take 1 tablet (20 mg total) by mouth Three (3) times a day.    sodium chloride 3 % NEBULIZER solution Inhale 1 vial (4 mL) by nebulization in the morning and 1 vial (4 mL) in the evening.    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 A.niger from sputum 08/2016-09/2016.    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    Sarcoidosis 1995    Type 2 diabetes mellitus with hyperglycemia (CMS-HCC) 07/27/2014    Visual impairment     glasses       Past Surgical History  Past Surgical History:  Procedure Laterality Date    LUNG LOBECTOMY      PR AMPUTATION TOE,MT-P JT Right 12/12/2019    Procedure: Right foot fifth toe amputation at the metatarsophalangeal joint level;  Surgeon: Britt Bottom, DPM;  Location: MAIN [facility];  Service: Vascular    PR AMPUTATION TOE,MT-P JT Right 06/16/2020    Procedure: Right foot amputation of 3rd toe at mpj level;  Surgeon: Britt Bottom, DPM;  Location: MAIN [facility];  Service: Vascular    PR RIGHT HEART CATH O2 SATURATION & CARDIAC OUTPUT N/A 04/29/2019    Procedure: Right Heart Catheterization;  Surgeon: Rosana Hoes, MD;  Location: [facility] CATH;  Service: Cardiology    PR THORACOSCOPY SURG TOT PULM DECORT Left 12/14/2014    Procedure: THORACOSCOPY SURG; W/TOT PULM DECORTIC/PNEUMOLYS;  Surgeon: Evert Kohl, MD;  Location: MA[facility];  Service: Cardiothoracic    PR THORACOSCOPY W/THERA WEDGE RESEXN INITIAL UNILAT Left 12/03/2014    Procedure: THORACOSCOPY, SURGICAL; WITH THERAPEUTIC WEDGE RESECTION (EG, MASS, NODULE) INITIAL UNILATERAL;  Surgeon: Evert Kohl, MD;  Location: MAIN OR [facility];  Service: Cardiothoracic       Family History  Family History   Problem Relation Age of Onset    Dementia Mother     Diabetes Father Diabetes Brother     Diabetes Maternal Aunt     Sarcoidosis Maternal Aunt     Diabetes Maternal Uncle     Diabetes Paternal Aunt     Diabetes Paternal Uncle        Social History  Social History     Socioeconomic History    Marital status: Single   Tobacco Use    Smoking status: Former     Current packs/day: 0.00     Average packs/day: 0.5 packs/day for 9.0 years (4.5 ttl pk-yrs)     Types: Cigarettes     Start date: 05/02/1978     Quit date: 05/03/1987     Years since quitting: 35.6    Smokeless tobacco: Never   Vaping Use    Vaping status: Never Used   Substance and Sexual Activity    Alcohol use: No     Alcohol/week: 0.0 standard drinks of alcohol    Drug use: No    Sexual activity: Yes   Other Topics Concern    Do you use sunscreen? No    Tanning bed use? No    Are you easily burned? No    Excessive sun exposure? No    Blistering sunburns? No     Social Determinants of Health     Financial Resource Strain: Low Risk  (11/06/2022)    Overall Financial Resource Strain (CARDIA)     Difficulty of Paying Living Expenses: Not hard at all   Food Insecurity: No Food Insecurity (11/06/2022)    Hunger Vital Sign     Worried About Running Out of Food in the Last Year: Never true     Ran Out of Food in the Last Year: Never true   Transportation Needs: No Transportation Needs (11/06/2022)    PRAPARE - Therapist, art (Medical): No     Lack of Transportation (Non-Medical): No           Objective     Vitals - past 24 hours  Temp:  [36.1 ??C (97 ??F)] 36.1 ??C (97 ??F)  Heart Rate:  [88-102] 89  SpO2 Pulse:  [84-103] 84  Resp:  [13-28] 15  BP: (91-141)/(56-81)  115/81  FiO2 (%):  [80 %-100 %] 80 %  SpO2:  [80 %-97 %] 96 % Intake/Output  No intake/output data recorded.     Physical Exam:    General: sitting upright in bed, tachypneic, in clear resp distress   HEENT: head atraumatic, normocephalic. PERRL. Sclera non-icteric.   CV: regular rhythm. No m/g/r. Capillary refill less than 3 seconds in all extremities.   Pulm: breath sounds diminished throughout lung fields bilaterally, mild crackles noted in bases bilaterally. Chest expansion symmetrical. No wheezing.   GI: Abdomen is soft/non-tender/non-distended. Active bowel sounds in all quadrants.   MSK: ROM full in all extremities.   Skin: No rashes of lesions noted   Neuro: Alert, oriented to person/place/time, follows commands in all extremities.     The patient's hospital stay has been complicated by the following clinically significant conditions requiring additional evaluation and treatment or having a significant effect of this patient's care: - Anemia POA requiring further investigation or monitoring  - Chronic kidney disease POA requiring further investigation, treatment, or monitoring    There is no height or weight on file to calculate BMI.            Wt Readings from Last 12 Encounters:   11/11/22 90.1 kg (198 lb 10.2 oz)   10/31/22 84.8 kg (187 lb)   10/22/22 84 kg (185 lb 3 oz)   09/25/22 83.9 kg (185 lb)   06/12/22 89.8 kg (198 lb)   05/16/22 89.4 kg (197 lb)   06/29/21 94.8 kg (208 lb 15.9 oz)   04/19/21 94.8 kg (209 lb)   11/02/20 98 kg (216 lb)   07/20/20 97.1 kg (214 lb)   06/08/20 97.1 kg (214 lb)   01/19/20 97.1 kg (214 lb)       Continuous Infusions:       Scheduled Medications:    arformoterol  15 mcg Nebulization BID (RT)    [START ON 11/30/2022] azithromycin  250 mg Oral Daily    budesonide  0.5 mg Nebulization BID (RT)    enoxaparin (LOVENOX) injection  40 mg Subcutaneous Nightly    gabapentin  300 mg Oral TID    insulin lispro  0-20 Units Subcutaneous ACHS    ipratropium  500 mcg Nebulization QID    [START ON 11/30/2022] posaconazole  200 mg Oral Daily    sildenafiL (pulm.hypertension)  20 mg Oral TID       PRN medications:  albuterol, dextrose in water, glucagon, glucose    Data/Imaging Review: Reviewed in Epic and personally interpreted on 11/29/2022. See EMR for detailed results.       Critical Care Attestation     This patient is critically ill or injured with the impairment of vital organ systems such that there is a high probability of imminent or life threatening deterioration in the patient's condition. This patient must remain in the ICU for ongoing evaluation of the comprehensive management plan outlined in this note. I directly provided critical care services as documented in this note and the critical care time spent (65 min) is exclusive of separately billable procedures.  In addition to time spent for critical care management, I also provided advance care planning services for  0  minutes (see GOC above or ACP note for details)???. Total billable critical care time 65 minutes.    Devera Englander Orion Modest, AGNP

## 2022-11-30 NOTE — Unmapped (Signed)
PHYSICAL THERAPY  Evaluation (11/30/22 0841)      Patient Name:  Karen Barber       Medical Record Number: [id number]   Date of Birth: [birth year]  Sex: Female        Post-Discharge Physical Therapy Recommendations:  PT Post Acute Discharge Recommendations: Skilled PT services indicated, 3x weekly   Equipment Recommendation  PT DME Recommendations: None          Treatment Diagnosis: Difficulty in walking, Generalized muscle weakness, Unsteadiness on feet  Treatment Diagnosis: Impaired mobility     Activity Tolerance: Limited by fatigue     ASSESSMENT  Problem List: Decreased endurance, Impaired balance, Shortness of breath, Decreased mobility      Assessment : 57 y.o. female with bronchiectasis type 1 in the setting of pulmonary sarcoidosis (6L [place] home O2), HTN, T2DM, COPD, s/p LUL lobectomy, and asthma. Hospital 9/11 for PET, but was hypoxic needing HFNC. Pt presents to PT eval with generalized weakness, decreased activity tolerance, impaired balance, and decreased endurance. Despite these impairments pt was able to perform bed mobility, functional transfers, and ambulate 43ft with supervision without use of an assistive device. Anticipate with improvements in medical status, pt will be able to progress towards 3x for post acute PT needs. Will continue to follow and progress while she remains in house.      Today's Interventions: Balance activities, Endurance activities, Gait training, Patient/Family/Caregiver Education, Therapeutic activity  Today's Interventions: PT eval, bed mobility, transfers, ambulation, vital sign monitoring, education re: importance of mobility, sitting up to improve lung function, PT role and POC, use call button to call for assist before returning to bed.     Personal Factors/Comorbidities Present: 1-2   Examination of Body System: Musculoskeletal, Pulmonary, Activity/participation       Clinical Decision Making: Moderate        PLAN  Planned Frequency of Treatment: Plan of Care Initiated: 11/30/22  1-2x per day for: 4-5x week  Planned Treatment Duration: 12/14/22     Planned Interventions: Education (Patient/Family/Caregiver), Gait training, Home exercise program, Self-care / Home Management training, Therapeutic Exercise, Therapeutic Activity     Goals:   Patient and Family Goals: Improve walking and return home     SHORT GOAL #1: Pt will be able to perform functional transfers independently               Time Frame : 2 weeks  SHORT GOAL #2: Pt will be able to ambulate 144ft with LRAD modified independently              Time Frame : 2 weeks  SHORT GOAL #3: Pt will be able to negotiate 2 stairs with LRAD modified independently                 Long Term Goal #1: Pt will be able to ambulate >1061ft for community ambulation distances independently  Time Frame: 4 weeks     Prognosis:  Good  Positive Indicators: motivation  Barriers to Discharge: Endurance deficits, Inability to safely perform ADLS     SUBJECTIVE  Communication Preference: Verbal,     Patient reports: I don't need that walker, I can get to the chair without it.        Prior Functional Status: Mod I w RW for household distances, Art gallery manager for community distances. On 6LO2 at baseline. Lives alone but has a brother nearby who can help out.  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 A.niger from sputum 08/2016-09/2016.    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    Sarcoidosis 1995    Type 2 diabetes mellitus with hyperglycemia (CMS-HCC) 07/27/2014    Visual impairment     glasses            Social History     Tobacco Use    Smoking status: Former     Current packs/day: 0.00     Average packs/day: 0.5 packs/day for 9.0 years (4.5 ttl pk-yrs)     Types: Cigarettes     Start date: 05/02/1978     Quit date: 05/03/1987     Years since quitting: 35.6    Smokeless tobacco: Never   Substance Use Topics    Alcohol use: No     Alcohol/week: 0.0 standard drinks of alcohol       Past Surgical History:   Procedure Laterality Date    LUNG LOBECTOMY      PR AMPUTATION TOE,MT-P JT Right 12/12/2019    Procedure: Right foot fifth toe amputation at the metatarsophalangeal joint level;  Surgeon: Britt Bottom, DPM;  Location: MAIN OR [facility];  Service: Vascular    PR AMPUTATION TOE,MT-P JT Right 06/16/2020    Procedure: Right foot amputation of 3rd toe at mpj level;  Surgeon: Britt Bottom, DPM;  Location: MAIN OR [facility];  Service: Vascular    PR RIGHT HEART CATH O2 SATURATION & CARDIAC OUTPUT N/A 04/29/2019    Procedure: Right Heart Catheterization;  Surgeon: Rosana Hoes, MD;  Location: [facility] CATH;  Service: Cardiology    PR THORACOSCOPY SURG TOT PULM DECORT Left 12/14/2014    Procedure: THORACOSCOPY SURG; W/TOT PULM DECORTIC/PNEUMOLYS;  Surgeon: Evert Kohl, MD;  Location: MAIN OR [facility];  Service: Cardiothoracic    PR THORACOSCOPY W/THERA WEDGE RESEXN INITIAL UNILAT Left 12/03/2014    Procedure: THORACOSCOPY, SURGICAL; WITH THERAPEUTIC WEDGE RESECTION (EG, MASS, NODULE) INITIAL UNILATERAL;  Surgeon: Evert Kohl, MD;  Location: MAIN OR [facility];  Service: Cardiothoracic             Family History   Problem Relation Age of Onset    Dementia Mother     Diabetes Father     Diabetes Brother     Diabetes Maternal Aunt Sarcoidosis Maternal Aunt     Diabetes Maternal Uncle     Diabetes Paternal Aunt     Diabetes Paternal Uncle         Allergies: Patient has no known allergies.         Objective Findings  Precautions / Restrictions  Precautions: Falls precautions  Weight Bearing Status: Non-applicable  Required Braces or Orthoses: Non-applicable  Pain Comments: Denies c/o pain     Equipment / Environment: Vascular access (PIV, TLC, Port-a-cath, PICC), Supplemental oxygen, Telemetry, Arterial line, Purewick/Condom catheter     Vitals/Orthostatics : Prior to arrival: HR: 68, BP: 117/66, MAP: 83, RR: 17. Patient satting at 86-87% sustained on 45L 50% HFNC and reporting fatigue; HFNC titrated to 45L 83% for recovery to mid-to-upper 90s for mobility with improved energy levels. Post mobility HR: 72, BP: 116/60, MAP: 80, O2: 98%. Patient left on 45L 83% HFNC with RT present giving breathing treatment and down titrate FiO2 as able.     Living Situation  Living Environment: House  Lives With: Alone (Significant other has been in rehab for 1 year, her brother lives near by and is able to help as needed)  Home Living: One level home, Standard height toilet, Walk-in shower, Tub/shower unit, Shower chair with back, Raised toilet seat with rails, Stairs to enter with rails  Rail placement (outside): Bilateral rails in reach  Number of Stairs to Enter (outside): 2  Caregiver Identified?: No      Cognition: WFL  Cognition comment: A&Ox4  Visual/Perception: Within Functional Limits  Hearing: No deficit identified     Skin Inspection: Intact where visualized     Upper Extremities  UE ROM: Right WFL, Left WFL  UE Strength: Right WFL, Left WFL  UE comment: Grossly WFL based on functional mobility    Lower Extremities  LE ROM: Right WFL, Left WFL  LE Strength: Right WFL, Left WFL  LE comment: BLE strength grossly 4/5          Coordination: WFL  Proprioception: Not tested  Sensation: WFL  Posture: WFL    Static Sitting-Level of Assistance: Independent  Dynamic Sitting-Level of Assistance: Independent  Sitting Balance comments: Steady sitting balance at EOB    Static Standing-Level of Assistance: Stand by assistance  Dynamic Standing - Level of Assistance: Stand by assistance  Standing Balance comments: Supervision with static standing and dynamic standing without use of an assistive device      Bed Mobility: Supine to Sit  Supine to Sit assistance level: Standby assist, set-up cues, supervision of patient - no hands on  Bed Mobility comments: SBA for long sitting in bed and scooting towards EOB.     Transfers: Sit to Stand  Sit to Stand assistance level: Standby assist, set-up cues, supervision of patient - no hands on  Transfer comments: SBA for safety without use of assistive device. Pt is steady standing at EOB, able to perform standing marches in place without overt LOB. Requires cueing for hand placement during stand to sit and for eccentric control      Gait Level of Assistance: Standby assist, set-up cues, supervision of patient - no hands on  Gait Assistive Device: Other (Comment) (None)  Gait Distance Ambulated (ft): 3 ft  Skilled Treatment Performed: Pt able to ambulate 86ft to the chair with close supervision and without use of an assistive device. Pt demos steady gait without LOB. Reports feeling well and SpO2 is stable throughout.     Stairs: Not assessed      Wheelchair Mobility: NA     Endurance: Fair    Patient at end of session: All needs in reach, In chair, Lines intact, Staff present    Physical Therapy Session Duration  PT Individual [mins]: 10  PT Co-Treatment [mins]: 24          AM-PAC-6 click  Help currently need turning over In bed?: None - Modified  Independent/Independent  Help currently needed sitting down/standing up from chair with arms? : None - Modified Independent/Independent  Help currently needed moving from supine to sitting on edge of bed?: None - Modified Independent/Independent  Help currently needed moving to and from bed from wheelchair?: None - Modified Independent/Independent  Help currently needed walking in a hospital room?: A Little - Minimal/Contact Guard Assist/Supervision  Help currently needed climbing 3-5 steps with railing?: A Little - Minimal/Contact Guard Assist/Supervision    Basic Mobility Score 6 click: 22    6 click Score (in points): % of Functional Impairment, Limitation, Restriction  6: 100% impaired, limited, restricted  7-8: At least 80%, but less than 100% impaired, limited restricted  9-13: At least 60%, but less than 80% impaired, limited restricted  14-19: At least 40%, but less than 60% impaired, limited restricted  20-22: At least 20%, but less than 40% impaired, limited restricted  23: At least 1%, but less than 20% impaired, limited restricted  24: 0% impaired, limited restricted        I attest that I have reviewed the above information.  Signed: Loel Ro, PT  Filed 11/30/2022

## 2022-12-01 LAB — CBC W/ AUTO DIFF
BASOPHILS ABSOLUTE COUNT: 0 10*9/L (ref 0.0–0.1)
BASOPHILS RELATIVE PERCENT: 0.1 %
EOSINOPHILS ABSOLUTE COUNT: 0 10*9/L (ref 0.0–0.5)
EOSINOPHILS RELATIVE PERCENT: 0.1 %
HEMATOCRIT: 29.4 % — ABNORMAL LOW (ref 34.0–44.0)
HEMOGLOBIN: 9.8 g/dL — ABNORMAL LOW (ref 11.3–14.9)
LYMPHOCYTES ABSOLUTE COUNT: 0.6 10*9/L — ABNORMAL LOW (ref 1.1–3.6)
LYMPHOCYTES RELATIVE PERCENT: 6.8 %
MEAN CORPUSCULAR HEMOGLOBIN CONC: 33.5 g/dL (ref 32.0–36.0)
MEAN CORPUSCULAR HEMOGLOBIN: 26.9 pg (ref 25.9–32.4)
MEAN CORPUSCULAR VOLUME: 80.3 fL (ref 77.6–95.7)
MEAN PLATELET VOLUME: 8.2 fL (ref 6.8–10.7)
MONOCYTES ABSOLUTE COUNT: 0.9 10*9/L — ABNORMAL HIGH (ref 0.3–0.8)
MONOCYTES RELATIVE PERCENT: 10 %
NEUTROPHILS ABSOLUTE COUNT: 7.7 10*9/L (ref 1.8–7.8)
NEUTROPHILS RELATIVE PERCENT: 83 %
PLATELET COUNT: 214 10*9/L (ref 150–450)
RED BLOOD CELL COUNT: 3.66 10*12/L — ABNORMAL LOW (ref 3.95–5.13)
RED CELL DISTRIBUTION WIDTH: 16.9 % — ABNORMAL HIGH (ref 12.2–15.2)
WBC ADJUSTED: 9.3 10*9/L (ref 3.6–11.2)

## 2022-12-01 LAB — BLOOD GAS CRITICAL CARE PANEL, ARTERIAL
BASE EXCESS ARTERIAL: 3.6 — ABNORMAL HIGH (ref -2.0–2.0)
BASE EXCESS ARTERIAL: 5.6 — ABNORMAL HIGH (ref -2.0–2.0)
BASE EXCESS ARTERIAL: -1.6 (ref -2.0–2.0)
CALCIUM IONIZED ARTERIAL (MG/DL): 4.44 mg/dL (ref 4.40–5.40)
CALCIUM IONIZED ARTERIAL (MG/DL): 4.74 mg/dL (ref 4.40–5.40)
CALCIUM IONIZED ARTERIAL (MG/DL): 3.7 mg/dL — ABNORMAL LOW (ref 4.40–5.40)
GLUCOSE WHOLE BLOOD: 109 mg/dL (ref 70–179)
GLUCOSE WHOLE BLOOD: 269 mg/dL — ABNORMAL HIGH (ref 70–179)
GLUCOSE WHOLE BLOOD: 201 mg/dL — ABNORMAL HIGH (ref 70–179)
HCO3 ARTERIAL: 30 mmol/L — ABNORMAL HIGH (ref 22–27)
HCO3 ARTERIAL: 30 mmol/L — ABNORMAL HIGH (ref 22–27)
HCO3 ARTERIAL: 23 mmol/L (ref 22–27)
HEMOGLOBIN BLOOD GAS: 16.1 g/dL — ABNORMAL HIGH (ref 12.00–16.00)
HEMOGLOBIN BLOOD GAS: 10.4 g/dL — ABNORMAL LOW
HEMOGLOBIN BLOOD GAS: 8 g/dL — ABNORMAL LOW (ref 12.00–16.00)
LACTATE BLOOD ARTERIAL: 0.7 mmol/L (ref <1.3)
LACTATE BLOOD ARTERIAL: 3.1 mmol/L — ABNORMAL HIGH (ref <1.3)
LACTATE BLOOD ARTERIAL: 1.4 mmol/L — ABNORMAL HIGH (ref <1.3)
O2 SATURATION ARTERIAL: 98.9 % (ref 94.0–100.0)
O2 SATURATION ARTERIAL: 93.2 % — ABNORMAL LOW (ref 94.0–100.0)
O2 SATURATION ARTERIAL: 96.2 % (ref 94.0–100.0)
PCO2 ARTERIAL: 50.4 mmHg — ABNORMAL HIGH (ref 35.0–45.0)
PCO2 ARTERIAL: 49.5 mmHg — ABNORMAL HIGH (ref 35.0–45.0)
PCO2 ARTERIAL: 39.8 mmHg (ref 35.0–45.0)
PH ARTERIAL: 7.38 (ref 7.35–7.45)
PH ARTERIAL: 7.4 (ref 7.35–7.45)
PH ARTERIAL: 7.38 (ref 7.35–7.45)
PO2 ARTERIAL: 124 mmHg — ABNORMAL HIGH (ref 80.0–110.0)
PO2 ARTERIAL: 65.6 mmHg — ABNORMAL LOW (ref 80.0–110.0)
PO2 ARTERIAL: 81.4 mmHg (ref 80.0–110.0)
POTASSIUM WHOLE BLOOD: 3.2 mmol/L — ABNORMAL LOW (ref 3.4–4.6)
POTASSIUM WHOLE BLOOD: 3.3 mmol/L — ABNORMAL LOW (ref 3.4–4.6)
POTASSIUM WHOLE BLOOD: 2.2 mmol/L — CL (ref 3.4–4.6)
SODIUM WHOLE BLOOD: 143 mmol/L (ref 135–145)
SODIUM WHOLE BLOOD: 141 mmol/L (ref 135–145)
SODIUM WHOLE BLOOD: 146 mmol/L — ABNORMAL HIGH (ref 135–145)

## 2022-12-01 LAB — BASIC METABOLIC PANEL
ANION GAP: 7 mmol/L (ref 5–14)
BLOOD UREA NITROGEN: 51 mg/dL — ABNORMAL HIGH (ref 9–23)
BUN / CREAT RATIO: 34
CALCIUM: 8.9 mg/dL (ref 8.7–10.4)
CHLORIDE: 101 mmol/L (ref 98–107)
CO2: 32 mmol/L — ABNORMAL HIGH (ref 20.0–31.0)
CREATININE: 1.48 mg/dL — ABNORMAL HIGH
EGFR CKD-EPI (2021) FEMALE: 41 mL/min/{1.73_m2} — ABNORMAL LOW (ref >=60)
GLUCOSE RANDOM: 287 mg/dL — ABNORMAL HIGH (ref 70–179)
POTASSIUM: 3.4 mmol/L (ref 3.4–4.8)
SODIUM: 140 mmol/L (ref 135–145)

## 2022-12-01 LAB — COMPREHENSIVE METABOLIC PANEL
ALBUMIN: 3 g/dL — ABNORMAL LOW (ref 3.4–5.0)
ALKALINE PHOSPHATASE: 72 U/L (ref 46–116)
ALT (SGPT): <7 U/L — ABNORMAL LOW (ref 10–49)
ANION GAP: 5 mmol/L (ref 5–14)
AST (SGOT): 9 U/L (ref <=34)
BILIRUBIN TOTAL: 0.4 mg/dL (ref 0.3–1.2)
BLOOD UREA NITROGEN: 58 mg/dL — ABNORMAL HIGH (ref 9–23)
BUN / CREAT RATIO: 36
CALCIUM: 9.9 mg/dL (ref 8.7–10.4)
CHLORIDE: 101 mmol/L (ref 98–107)
CO2: 34 mmol/L — ABNORMAL HIGH (ref 20.0–31.0)
CREATININE: 1.63 mg/dL — ABNORMAL HIGH
EGFR CKD-EPI (2021) FEMALE: 36 mL/min/{1.73_m2} — ABNORMAL LOW (ref >=60)
GLUCOSE RANDOM: 129 mg/dL (ref 70–179)
POTASSIUM: 4 mmol/L (ref 3.4–4.8)
PROTEIN TOTAL: 7.2 g/dL (ref 5.7–8.2)
SODIUM: 140 mmol/L (ref 135–145)

## 2022-12-01 LAB — PHOSPHORUS: PHOSPHORUS: 4.1 mg/dL (ref 2.4–5.1)

## 2022-12-01 LAB — PRO-BNP: PRO-BNP: 2993 pg/mL — ABNORMAL HIGH (ref <=300.0)

## 2022-12-01 LAB — MAGNESIUM: MAGNESIUM: 2 mg/dL (ref 1.6–2.6)

## 2022-12-01 MED ADMIN — azithromycin (ZITHROMAX) tablet 250 mg: 250 mg | ORAL | @ 13:00:00 | Stop: 2025-08-25

## 2022-12-01 MED ADMIN — budesonide (PULMICORT) nebulizer solution 0.5 mg: .5 mg | RESPIRATORY_TRACT

## 2022-12-01 MED ADMIN — insulin lispro (HumaLOG) injection 0-20 Units: 0-20 [IU] | SUBCUTANEOUS

## 2022-12-01 MED ADMIN — ipratropium (ATROVENT) 0.02 % nebulizer solution 500 mcg: 500 ug | RESPIRATORY_TRACT | @ 12:00:00

## 2022-12-01 MED ADMIN — calcium carbonate (TUMS) chewable tablet 200 mg elem calcium: 200 mg | ORAL | @ 01:00:00

## 2022-12-01 MED ADMIN — budesonide (PULMICORT) nebulizer solution 0.5 mg: .5 mg | RESPIRATORY_TRACT | @ 12:00:00

## 2022-12-01 MED ADMIN — calcium carbonate (TUMS) chewable tablet 200 mg elem calcium: 200 mg | ORAL | @ 16:00:00

## 2022-12-01 MED ADMIN — posaconazole (NOXAFIL) delayed released tablet 200 mg: 200 mg | ORAL | @ 13:00:00

## 2022-12-01 MED ADMIN — sildenafiL (pulm.hypertension) (REVATIO) tablet 20 mg: 20 mg | ORAL | @ 13:00:00

## 2022-12-01 MED ADMIN — insulin lispro (HumaLOG) injection 0-20 Units: 0-20 [IU] | SUBCUTANEOUS | @ 16:00:00

## 2022-12-01 MED ADMIN — furosemide (LASIX) injection 40 mg: 40 mg | INTRAVENOUS | @ 10:00:00

## 2022-12-01 MED ADMIN — insulin NPH (HumuLIN,NovoLIN) injection 25 Units: 25 [IU] | SUBCUTANEOUS

## 2022-12-01 MED ADMIN — gabapentin (NEURONTIN) capsule 300 mg: 300 mg | ORAL | @ 13:00:00

## 2022-12-01 MED ADMIN — arformoterol (BROVANA) nebulizer solution 15 mcg/2 mL: 15 ug | RESPIRATORY_TRACT

## 2022-12-01 MED ADMIN — arformoterol (BROVANA) nebulizer solution 15 mcg/2 mL: 15 ug | RESPIRATORY_TRACT | @ 12:00:00

## 2022-12-01 MED ADMIN — insulin NPH (HumuLIN,NovoLIN) injection 25 Units: 25 [IU] | SUBCUTANEOUS | @ 13:00:00

## 2022-12-01 MED ADMIN — furosemide (LASIX) injection 40 mg: 40 mg | INTRAVENOUS | @ 18:00:00

## 2022-12-01 MED ADMIN — gabapentin (NEURONTIN) capsule 300 mg: 300 mg | ORAL | @ 18:00:00

## 2022-12-01 MED ADMIN — enoxaparin (LOVENOX) syringe 40 mg: 40 mg | SUBCUTANEOUS

## 2022-12-01 MED ADMIN — ipratropium (ATROVENT) 0.02 % nebulizer solution 500 mcg: 500 ug | RESPIRATORY_TRACT

## 2022-12-01 MED ADMIN — sildenafiL (pulm.hypertension) (REVATIO) tablet 20 mg: 20 mg | ORAL | @ 18:00:00

## 2022-12-01 MED ADMIN — sildenafiL (pulm.hypertension) (REVATIO) tablet 20 mg: 20 mg | ORAL

## 2022-12-01 MED ADMIN — sodium chloride 3 % NEBULIZER solution 4 mL: 4 mL | RESPIRATORY_TRACT | @ 13:00:00

## 2022-12-01 MED ADMIN — sodium chloride 3 % NEBULIZER solution 4 mL: 4 mL | RESPIRATORY_TRACT

## 2022-12-01 MED ADMIN — gabapentin (NEURONTIN) capsule 300 mg: 300 mg | ORAL

## 2022-12-01 NOTE — Unmapped (Signed)
PULMONARY & CRITICAL CARE MEDICINE    MICU APP Critical Care Note:       Ms. Ekern is a 57 y.o. female who is critically ill with hypoxemic respiratory failure w/ underlying pHTN and sarcoidosis.      ASSESSMENT & PLAN  - diuresed well today  - remains on HFNC   - placed briefly on AVAPS by RT but after discussions earlier in the day favored HFNC so switched back and tolerated well  - can likely be weaned to [facility] today      Critical Care Attestation     This patient is critically ill or injured with the impairment of vital organ systems such that there is a high probability of imminent or life threatening deterioration in the patient's condition. This patient must remain in the ICU for ongoing evaluation of the comprehensive management plan outlined in this note. I directly provided critical care services as documented in this note and the critical care time spent (40 min) is exclusive of separately billable procedures.  In addition to time spent for critical care management, I also provided advance care planning services for  0  minutes (see GOC above or ACP note for details)???. Total billable critical care time 40 minutes.    Daryel November, ACNP

## 2022-12-01 NOTE — Unmapped (Signed)
A&O x4. Remains on high flow, increased to 45%.  NSR. No gtts.     Problem: Adult Inpatient Plan of Care  Goal: Plan of Care Review  Outcome: Progressing  Goal: Patient-Specific Goal (Individualized)  Outcome: Progressing  Goal: Absence of Hospital-Acquired Illness or Injury  Outcome: Progressing  Intervention: Identify and Manage Fall Risk  Recent Flowsheet Documentation  Taken 12/01/2022 0200 by Erroll Luna, RN  Safety Interventions: environmental modification  Taken 12/01/2022 0000 by Erroll Luna, RN  Safety Interventions: environmental modification  Taken 11/30/2022 2200 by Erroll Luna, RN  Safety Interventions: environmental modification  Taken 11/30/2022 2000 by Erroll Luna, RN  Safety Interventions: environmental modification  Intervention: Prevent Skin Injury  Recent Flowsheet Documentation  Taken 12/01/2022 0200 by Erroll Luna, RN  Positioning for Skin: (pt repositioning herself) Other (Comment)  Device Skin Pressure Protection: positioning supports utilized  Skin Protection: adhesive use limited  Taken 12/01/2022 0000 by Erroll Luna, RN  Positioning for Skin: Left  Device Skin Pressure Protection: positioning supports utilized  Skin Protection: adhesive use limited  Taken 11/30/2022 2200 by Erroll Luna, RN  Positioning for Skin: Right  Device Skin Pressure Protection: positioning supports utilized  Skin Protection: adhesive use limited  Taken 11/30/2022 2000 by Erroll Luna, RN  Positioning for Skin: Bed in Chair  Device Skin Pressure Protection: positioning supports utilized  Skin Protection: adhesive use limited  Intervention: Prevent and Manage VTE (Venous Thromboembolism) Risk  Recent Flowsheet Documentation  Taken 11/30/2022 2000 by Erroll Luna, RN  VTE Prevention/Management: anticoagulant therapy  Intervention: Prevent Infection  Recent Flowsheet Documentation  Taken 12/01/2022 0200 by Erroll Luna, RN  Infection Prevention: rest/sleep promoted  Taken 12/01/2022 0000 by Erroll Luna, RN  Infection Prevention: rest/sleep promoted  Taken 11/30/2022 2200 by Erroll Luna, RN  Infection Prevention: rest/sleep promoted  Taken 11/30/2022 2000 by Erroll Luna, RN  Infection Prevention: rest/sleep promoted  Goal: Optimal Comfort and Wellbeing  Outcome: Progressing  Goal: Readiness for Transition of Care  Outcome: Progressing  Goal: Rounds/Family Conference  Outcome: Progressing

## 2022-12-01 NOTE — Unmapped (Signed)
Speech Language Pathology Clinical Swallow Assessment  Evaluation (11/30/22 1510)    Patient Name:  Karen Barber       Medical Record Number: [id number]   Date of Birth: 1965/11/26  Sex: Female            SLP Treatment Diagnosis:    r/o dysphagia  Activity Tolerance: Patient tolerated treatment well    Assessment  Pt seen at the bedside for clinical swallow evaluation in the setting of respiratory failure. Pt mildly dysphonic and without s/s of aspiration with PO; safe for PO diet.     Pt awake, alert, and followed simple directions. Pt on high flow oxygen via nasal canula at the time of evaluation. Pt denies history of dysphagia and has a baseline diet of thin liquids and regular consistency solids. Pt mildly dysphonic voice with dry, strong intermittent cough. Pt oral mech exam notable for some missing teeth; otherwise, unremarkable. Pt trialed the following PO: thin liquids (water) by cup; regular consistency solids (cracker). Pt without overt clinical s/s of aspiration with PO. Pt with good oral acceptance, grossly intact mastication, and good oral clearance without residue. Pt noted that she sometimes gets out of breath when eating. SLP provided safe swallowing strategies education, including slow rate, small bites/sips, upright at 90 degrees, and having small meals. Pt accepted education and verbally indicated understanding. Given that the pt does not present with clinical s/s of aspiration, recommend that the pt remain on current diet of thin liquids-0 and regular consistency solids with the above-mentioned aspiration precautions. Further skilled acute SLP services not indicated at this time; SLP to sign off. Please reconsult if needed.  Risk for Aspiration: Mild     Recommendations:  PO Diet           Diet Liquids Recommendations: Thin Liquids, Level 0    Diet Solids Recommendation: Regular Consistency Solids    Recommended Form of Medications: Whole, Crushed, With liquid, With puree      Recommended Compensatory Techniques : Slow rate, Small sips/bites, Upright 90 degrees, Endurance may be limited: give small meals and snacks    Post Acute Discharge Recommendations  Post Acute SLP Discharge Recommendations: Skilled SLP services are NOT indicated    Prognosis: Good  Positive Indicators: clinical findings  Barriers to Discharge: Endurance deficits, Inability to safely perform ADLS     Plan of Care  SLP Follow-up / Frequency: D/C Services, D/C Services       Treatment Goals:    Patient and Family Goal: none stated    Subjective  Medical Updates Since Last Visit/Relevant PMH Affecting Clinical Decision Making: Per chart review: THERSA MERRIAM is a 57 y.o. female with a past medical history of bronchiectasis type 1 in the setting of pulmonary sarcoidosis (6L [place] home O2), HTN, T2DM, COPD, s/p LUL lobectomy, and asthma that initially presented today to the hospital for a PET scan to evaluate her sarcoidosis. To prepare for her appointment, she states that she was told to not take any of her home medicines, including her home sidenafil, lasix, nebulizers, and inhalers.      Upon presentation to the appointment, she was found to have an increased work of breathing and her SPO2 was in the 70s on her home [facility], prompting her to be evaluated in the ED. As her respiratory status did not improve with NRB, she was placed on HFNC. 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      Of note, the patient had a recent  admission for pneumonia from 11/05/2022 to 11/12/2022, during which she was followed by the pulm consult team. While the patient was discharged on her baseline O2 need, the patient states that she never really returned to her baseline respiratory status. She endorses having ongoing shortness of breath since discharge and stated that she frequently would increase the flow of her [place] at home.      In the ED, the patient was without a fever or leukocytosis, lactate was non-elevated, electrolytes were within an appropriate range. She denied any recent fevers, chest pain, and no lower extremity leg swelling was appreciated. She received one dose of 125mg  of IV methylprednisolone and then was transferred to the MICU for further management of her hypoxia.                     Communication Preference: Verbal  Patient/Caregiver Reports: denies hx of dysphagia  Pain: no s/s of pain           Allergies: Patient has no known allergies.  Current Facility-Administered Medications   Medication Dose Route Frequency Provider Last Rate Last Admin    acetaminophen (TYLENOL) tablet 650 mg  650 mg Oral Q6H PRN Moreb, Raford Pitcher, ACNP        albuterol 2.5 mg /3 mL (0.083 %) nebulizer solution 2.5 mg  2.5 mg Nebulization Q6H PRN Linna Hoff, MD        arformoterol [facility]) nebulizer solution 15 mcg/2 mL  15 mcg Nebulization BID (RT) Linna Hoff, MD   15 mcg at 11/30/22 0754    azithromycin (ZITHROMAX) tablet 250 mg  250 mg Oral Daily Linna Hoff, MD   250 mg at 11/30/22 0833    budesonide (PULMICORT) nebulizer solution 0.5 mg  0.5 mg Nebulization BID (RT) Linna Hoff, MD   0.5 mg at 11/30/22 0754    calcium carbonate (TUMS) chewable tablet 200 mg elem calcium  200 mg elem calcium Oral TID PRN Gwynneth Munson, AGNP   200 mg elem calcium at 11/30/22 0526    dextrose (D10W) 10% bolus 125 mL  12.5 g Intravenous Q10 Min PRN Linna Hoff, MD        enoxaparin (LOVENOX) syringe 40 mg  40 mg Subcutaneous Nightly Linna Hoff, MD   40 mg at 11/29/22 2200    furosemide (LASIX) injection 40 mg  40 mg Intravenous BID Hyman Hopes, ACNP   40 mg at 11/30/22 1359    gabapentin (NEURONTIN) capsule 300 mg  300 mg Oral TID Linna Hoff, MD   300 mg at 11/30/22 1359    glucagon injection 1 mg  1 mg Intramuscular Once PRN Linna Hoff, MD        glucose chewable tablet 16 g  16 g Oral Q10 Min PRN Linna Hoff, MD        insulin lispro (HumaLOG) injection 0-20 Units  0-20 Units Subcutaneous Donald Siva, MD 4 Units at 11/30/22 1039    insulin NPH (HumuLIN,NovoLIN) injection 25 Units  25 Units Subcutaneous Q12H SCH Moreb, Raford Pitcher, ACNP        ipratropium (ATROVENT) 0.02 % nebulizer solution 500 mcg  500 mcg Nebulization BID (RT) Montine Circle, MD        levoFLOXacin [facility]) tablet 750 mg  750 mg Oral Every other day Hyman Hopes, ACNP   750 mg at 11/30/22 0833    ondansetron (ZOFRAN-ODT) disintegrating tablet 4 mg  4  mg Oral Q8H PRN Janetta Hora, MD        Or    ondansetron [facility]) injection 4 mg  4 mg Intravenous Q8H PRN Newt Lukes, Nimit, MD        polyethylene glycol (MIRALAX) packet 17 g  17 g Oral Daily Luttrell, Ashleigh Lynn, AGNP        posaconazole (NOXAFIL) delayed released tablet 200 mg  200 mg Oral Daily Linna Hoff, MD   200 mg at 11/30/22 0981    senna (SENOKOT) tablet 2 tablet  2 tablet Oral Nightly PRN Gwynneth Munson, AGNP        sildenafiL (pulm.hypertension) (REVATIO) tablet 20 mg  20 mg Oral TID Linna Hoff, MD   20 mg at 11/30/22 1359    sodium chloride (OCEAN) 0.65 % nasal spray 1 spray  1 spray Each Nare Q6H PRN Gwynneth Munson, AGNP        sodium chloride 3 % NEBULIZER solution 4 mL  4 mL Nebulization BID Gwynneth Munson, AGNP   4 mL at 11/30/22 1914     Past Medical History:   Diagnosis Date    Achromobacter pneumonia (CMS-HCC) 10/2014    treated with meropenem x14d; returned 09/2016, treated with meropenem x4wks    Breast injury     lung surg on left incision under breast sept 2016    Caregiver burden     parents     Hepatitis C antibody test positive 2016    repeatedly negative HCV RNA, indicating clearance of infection w/o treatment    Hyperlipidemia     Hypertension     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 A.niger from sputum 08/2016-09/2016.    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    Sarcoidosis 1995    Type 2 diabetes mellitus with hyperglycemia (CMS-HCC) 07/27/2014    Visual impairment     glasses     Family History   Problem Relation Age of Onset    Dementia Mother     Diabetes Father     Diabetes Brother     Diabetes Maternal Aunt     Sarcoidosis Maternal Aunt     Diabetes Maternal Uncle     Diabetes Paternal Aunt     Diabetes Paternal Uncle      Past Surgical History:   Procedure Laterality Date    LUNG LOBECTOMY      PR AMPUTATION TOE,MT-P JT Right 12/12/2019    Procedure: Right foot fifth toe amputation at the metatarsophalangeal joint level;  Surgeon: Britt Bottom, DPM;  Location: MAIN [facility];  Service: Vascular    PR AMPUTATION TOE,MT-P JT Right 06/16/2020    Procedure: Right foot amputation of 3rd toe at mpj level;  Surgeon: Britt Bottom, DPM;  Location: MAIN OR [facility];  Service: Vascular    PR RIGHT HEART CATH O2 SATURATION & CARDIAC OUTPUT N/A 04/29/2019    Procedure: Right Heart Catheterization;  Surgeon: Rosana Hoes, MD;  Location: [facility] CATH;  Service: Cardiology    PR THORACOSCOPY SURG TOT PULM DECORT Left 12/14/2014    Procedure: THORACOSCOPY SURG; W/TOT PULM DECORTIC/PNEUMOLYS;  Surgeon: Evert Kohl, MD;  Location: MAIN OR [facility];  Service: Cardiothoracic    PR THORACOSCOPY W/THERA WEDGE RESEXN INITIAL UNILAT  Left 12/03/2014    Procedure: THORACOSCOPY, SURGICAL; WITH THERAPEUTIC WEDGE RESECTION (EG, MASS, NODULE) INITIAL UNILATERAL;  Surgeon: Evert Kohl, MD;  Location: MAIN OR [facility];  Service: Cardiothoracic     Social History     Tobacco Use    Smoking status: Former     Current packs/day: 0.00     Average packs/day: 0.5 packs/day for 9.0 years (4.5 ttl pk-yrs)     Types: Cigarettes     Start date: 05/02/1978     Quit date: 05/03/1987     Years since quitting: 35.6    Smokeless tobacco: Never   Substance Use Topics    Alcohol use: No     Alcohol/week: 0.0 standard drinks of alcohol         General: Self-Feeding Capacity: Functional for self-feeding                   Medical Tests / Procedures Comments: CXR (9/11): Extensive fibrotic changes and biapical bulla appear grossly unchanged. Extensive pulmonary fibrosis limits evaluation for superimposed infection.  Equipment/Environment: Supplemental oxygen       Precautions / Restrictions  Precautions: Falls precautions  Weight Bearing Status: Non-applicable  Required Braces or Orthoses: Non-applicable    Objective  Temperature Spikes Noted: No  Respiratory Status : High flow nasal cannula  History of Intubation: No          Behavior/Cognition: Alert, Cooperative, Pleasant mood  Positioning : Upright in bed    Oral / Motor Exam  Vocal Quality: Dysphonic  Volitional Swallow: Within Functional Limits   Labial ROM: Within Functional Limits   Labial Symmetry: Within Functional Limits  Labial Strength: Within Functional Limits   Lingual ROM: Within Functional Limits  Lingual Symmetry: Within Functional Limits  Lingual Strength: Within Functional Limits      Velum: elevation grossly WNL   Mandible: Within Functional Limits     Facial ROM: Within Functional Limits   Facial Symmetry: Within Functional Limits  Facial Strength: Within Functional Limits              Apraxia: None present   Dysarthria: None present   Intelligibility: Intelligible   Breath Support: Adequate for speech   Dentition: Some missing teeth    Consistencies assessed: thin liquids (water) by cup; regular consistency solids (cracker)    Patient at end of session: All needs in reach         Speech Therapy Session Duration  SLP Individual [mins]: 25    I attest that I have reviewed the above information.  Signed: Jacquenette Shone, SLP    Filed 11/30/2022

## 2022-12-01 NOTE — Unmapped (Signed)
MICU Progress Note     Date of Service: 12/01/2022    Problem List:   Principal Problem:    Acute on chronic hypoxic respiratory failure (CMS-HCC)  Active Problems:    Pulmonary sarcoidosis (CMS-HCC)    Type 2 diabetes mellitus with hyperglycemia (CMS-HCC)    COPD (chronic obstructive pulmonary disease) (CMS-HCC)    Chronic pulmonary aspergillosis (CMS-HCC)    Pulmonary hypertension (CMS-HCC)    AKI (acute kidney injury) (CMS-HCC)      HPI: Karen Barber is a 57 y.o. female with a past medical history of bronchiectasis type 1 in the setting of pulmonary sarcoidosis (6L [place] home O2), HTN, T2DM, COPD, s/p LUL lobectomy, and asthma that initially presented today to the hospital for a PET scan to evaluate her sarcoidosis. To prepare for her appointment, she states that she was told to not take any of her home medicines, including her home sidenafil, lasix, nebulizers, and inhalers.     Upon presentation to the appointment, she was found to have an increased work of breathing and her SPO2 was in the 70s on her home [facility], prompting her to be evaluated in the ED. As her respiratory status did not improve with NRB, she was placed on HFNC. 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     Of note, the patient had a recent admission for pneumonia from 11/05/2022 to 11/12/2022, during which she was followed by the pulm consult team. While the patient was discharged on her baseline O2 need, the patient states that she never really returned to her baseline respiratory status. She endorses having ongoing shortness of breath since discharge and stated that she frequently would increase the flow of her [place] at home.     In the ED, the patient was without a fever or leukocytosis, lactate was non-elevated, electrolytes were within an appropriate range. She denied any recent fevers, chest pain, and no lower extremity leg swelling was appreciated. She received one dose of 125mg  of IV methylprednisolone and then was transferred to the MICU for further management of her hypoxia.     24hr events:   - FiO2 requirement improved since admission   - plan to trial on oxymizer this am but patient continues to desat to mid 80's on HFNC so will hold off for now and reassess this afternoon    Neurological   Chronic Pain   - Continuing home gabapentin   - PT/OT consulted, appreciate recs     Analgesia: No pain issues  RASS at goal? N/A, not on sedation  [place] Agitation Assessment Scale (RASS) : 0 (12/01/2022 10:00 AM)       Pulmonary   Acute on chronic hypoxemic respiratory failure - Pulmonary Sarcoidosis - COPD  Patient with history of COPD, pulmonary sarcoidosis, and PAH (on 6L [place] at baseline) that presented to the hospital 9/11 with SPO2 in the 70s on home O2. To assist with her hypoxia, she was placed on HFNC. 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 Of note, patient endorses increasing the flow of her [place] frequently when at home. This is a new change after her recent admission for pneumonia.   - Continue to wean O2 as able. May need to consider a new O2 setting for home. --> 50% HFNC  - Goal SPO2 > 90%  - Continue home AVAPS at night   - Continue home airway clearance in the setting of bronchiectasis (Aerobika and HTS neb). Of note, during previous admission, also used Pari Proneb Max (  see note from Dr. Stasia Cavalier on 8/24). May need to consider.   - Continue home pulmicort, brovana, atrovent and home PRN albuterol   - CXR as above   - Received a one time dose of 125mg  of IV methylpred in the ED  - 9/13 plan to trial on oxymizer this am but patient continues to desat to mid 80's on HFNC so will hold off for now and reassess this afternoon      FiO2 (%):  [40 %-55 %] 40 %  O2 Device: Heated high flow nasal cannula  O2 Flow Rate (L/min):  [40 L/min-50 L/min] 45 L/min    Cardiovascular   Pulmonary hypertension - RV Failure  Patient with history of PAH. Last echo on 10/23/22 showing LVEF with normal size but increased wall thickness, LVED at > 55%, severe pulmonary HTN, RV severely dilated in size with reduced systolic function, RA mildly dilated.   - repeat Echo pending   - Trend daily BNP   - At home takes lasix 80mg  BID. Plan was to reconsider dose post last admission. Holding home lasix for now and continuing with spot IV diuresis. (Given 1x dose of 40mg  IV on 9/11)  - Continue home sidenafil 40mg  TID  - Strict Is/Os and daily weights   - started lasix 40 IV bid     Renal   AKI on CKD3  Patient with baseline creatinine between 1.2-1.4. Admitted with creatinine at 1.8, likely in the setting of hypoxemia.   - Consider placement of foley catheter for closer I/O monitoring   - Monitoring electrolytes and creatinine with daily BMP   - Renally dose medications, avoid nephrotoxic agents     Infectious Disease/Autoimmune   Potential for Respiratory Infection   Patient without fever, leukocytosis, lactate elevation, however, presented with increased work of breathing, shortness of breath, and hypoxemia. CXR showing extensive pulmonary fibrosis, but unable to clearly evaluate for presence of pneumonia on film. Of note, patient admitted from 8/18-8/25 with Serratia pneumonia. There was concern for potential chronic aspiration. Planned to have outpatient barium swallow to evaluate esophagus.   - Started on vanc/cefepime for broad coverage --> stopped vanc and cefepime, started levaquin (9/12----  - MRSA screen neg  - RPP neg   - Will send off sputum sample if able   - continued azithromycin (9/12---    Chronic pulmonary aspergillosis   Continue home posa ppx    Cultures:  Blood Culture, Routine (no units)   Date Value   11/05/2022 No Growth at 5 days   11/05/2022 No Growth at 5 days     Lower Respiratory Culture (no units)   Date Value   11/08/2022 3+ Serratia marcescens (A)   11/08/2022 1+ Oropharyngeal Flora Isolated     WBC (10*9/L)   Date Value   12/01/2022 9.3          FEN/GI   Nutrition:   - reg diet    - No indication for GI ppx at this time   - Miralax daily and senna PRN   - barium swallow pending for prior c/f aspiration events    Provider Malnutrition Assessment:  Body mass index is 25.89 kg/m??.  GLIM criteria:   Pt does not meet criteria  -I have screened this patient for malnutrition and they did NOT meet criteria for malnutrition based on GLIM criteria.  -Nutrition consulted no    Heme/Coag   Chronic Anemia, likely IDA   Iron deficiency anemia is associated with pulmonary hypertension in  multiple studies.   - H/H up from last admission, appropriate at this time   - Platelets appropiate   - Continue lovenox ppx (may need to switch to heparin if AKI worsens)   - Repeating iron studies     Endocrine   Type 2 diabetes mellitus with hyperglycemia   HgbA1c 8.3% July 2024, Home regimen of glargine 18 units nightly, sliding scale insulin.  - NPH 15 bid w/ SSI, low threshold to increase--> 9/12 NPH increased to 25U BID    Integumentary   NAI  - WOCN consulted for high risk skin assessment No. Reason: not indicated .  - cont pressure mitigating precautions per skin policy    Prophylaxis/LDA/Restraints/Consults   ICU Checklist completed: yes (see ICU rounding navigator in Epic)      Patient Lines/Drains/Airways Status       Active Active Lines, Drains, & Airways       Name Placement date Placement time Site Days    External Urinary Device 11/06/22 With Suction 11/06/22  0800  -- 25    Peripheral IV 11/29/22 Left;Posterior Hand 11/29/22  1947  Hand  1    Peripheral IV 11/30/22 Right Forearm 11/30/22  0300  Forearm  1    Arterial Line 11/29/22 Left Radial 11/29/22  2108  Radial  1                  Patient Lines/Drains/Airways Status       Active Wounds       None                    Goals of Care     Code Status:  Full Code.     Designated Chiropodist Maker:  Ms. Lynam current decisional capacity for healthcare decision-making is Full capacity. Her designated Educational psychologist) is/are   HCDM (patient stated preference): [name] - Media planner - [phone].      Subjective   Patient c/o discomfort at aline site otherwise no other complaints reported. Aline Lt wrist with palpable pulses    Objective     Vitals - past 24 hours  Temp:  [35.9 ??C (96.7 ??F)-36.7 ??C (98 ??F)] 36.6 ??C (97.8 ??F)  Heart Rate:  [68-104] 86  SpO2 Pulse:  [68-103] 86  Resp:  [12-36] 26  FiO2 (%):  [40 %-55 %] 40 %  SpO2:  [89 %-100 %] 98 % Intake/Output  I/O last 3 completed shifts:  In: 960 [P.O.:300; I.V.:560; IV Piggyback:100]  Out: 2500 [Urine:2500]     Physical Exam:    General: sitting upright in bed, tachypneic, in NAD  HEENT: head atraumatic, normocephalic. PERRL. Sclera non-icteric.   CV: regular rhythm. No m/g/r. Capillary refill less than 3 seconds in all extremities.   Pulm: breath sounds diminished throughout lung fields bilaterally, mild crackles noted in bases bilaterally. Chest expansion symmetrical. No wheezing.   GI: Abdomen is soft/non-tender/non-distended. Active bowel sounds in all quadrants.   MSK: ROM full in all extremities.   Skin: No rashes of lesions noted   Neuro: Alert, oriented to person/place/time, follows commands in all extremities.     The patient's hospital stay has been complicated by the following clinically significant conditions requiring additional evaluation and treatment or having a significant effect of this patient's care: - Anemia POA requiring further investigation or monitoring  - Chronic kidney disease POA requiring further investigation, treatment, or monitoring    Body mass index is 25.89 kg/m??.  Wt Readings from Last 12 Encounters:   11/30/22 84.2 kg (185 lb 10 oz)   11/11/22 90.1 kg (198 lb 10.2 oz)   10/31/22 84.8 kg (187 lb)   10/22/22 84 kg (185 lb 3 oz)   09/25/22 83.9 kg (185 lb)   06/12/22 89.8 kg (198 lb)   05/16/22 89.4 kg (197 lb)   06/29/21 94.8 kg (208 lb 15.9 oz)   04/19/21 94.8 kg (209 lb)   11/02/20 98 kg (216 lb)   07/20/20 97.1 kg (214 lb)   06/08/20 97.1 kg (214 lb)       Continuous Infusions:       Scheduled Medications:    arformoterol  15 mcg Nebulization BID (RT)    azithromycin  250 mg Oral Daily    budesonide  0.5 mg Nebulization BID (RT)    enoxaparin (LOVENOX) injection  40 mg Subcutaneous Nightly    furosemide  40 mg Intravenous BID    gabapentin  300 mg Oral TID    insulin lispro  0-20 Units Subcutaneous ACHS    insulin NPH  25 Units Subcutaneous Q12H SCH    ipratropium  500 mcg Nebulization BID (RT)    levoFLOXacin  750 mg Oral Every other day    polyethylene glycol  17 g Oral Daily    posaconazole  200 mg Oral Daily    sildenafiL (pulm.hypertension)  20 mg Oral TID    sodium chloride  4 mL Nebulization BID       PRN medications:  acetaminophen, albuterol, calcium carbonate, dextrose in water, glucagon, glucose, ondansetron **OR** ondansetron, senna, sodium chloride    Data/Imaging Review: Reviewed in Epic and personally interpreted on 12/01/2022. See EMR for detailed results.       Critical Care Attestation     This patient is critically ill or injured with the impairment of vital organ systems such that there is a high probability of imminent or life threatening deterioration in the patient's condition. This patient must remain in the ICU for ongoing evaluation of the comprehensive management plan outlined in this note. I directly provided critical care services as documented in this note and the critical care time spent (40 min) is exclusive of separately billable procedures.  In addition to time spent for critical care management, I also provided advance care planning services for  0  minutes (see GOC above or ACP note for details)???. Total billable critical care time 40 minutes.    Arlyn Dunning, AGNP

## 2022-12-02 LAB — CBC W/ AUTO DIFF
BASOPHILS ABSOLUTE COUNT: 0.1 10*9/L (ref 0.0–0.1)
BASOPHILS RELATIVE PERCENT: 0.8 %
EOSINOPHILS ABSOLUTE COUNT: 0.6 10*9/L — ABNORMAL HIGH (ref 0.0–0.5)
EOSINOPHILS RELATIVE PERCENT: 6.3 %
HEMATOCRIT: 32.7 % — ABNORMAL LOW (ref 34.0–44.0)
HEMOGLOBIN: 10.4 g/dL — ABNORMAL LOW (ref 11.3–14.9)
LYMPHOCYTES ABSOLUTE COUNT: 1.6 10*9/L (ref 1.1–3.6)
LYMPHOCYTES RELATIVE PERCENT: 17 %
MEAN CORPUSCULAR HEMOGLOBIN CONC: 31.8 g/dL — ABNORMAL LOW (ref 32.0–36.0)
MEAN CORPUSCULAR HEMOGLOBIN: 26 pg (ref 25.9–32.4)
MEAN CORPUSCULAR VOLUME: 81.9 fL (ref 77.6–95.7)
MEAN PLATELET VOLUME: 7.9 fL (ref 6.8–10.7)
MONOCYTES ABSOLUTE COUNT: 1 10*9/L — ABNORMAL HIGH (ref 0.3–0.8)
MONOCYTES RELATIVE PERCENT: 10.4 %
NEUTROPHILS ABSOLUTE COUNT: 6.1 10*9/L (ref 1.8–7.8)
NEUTROPHILS RELATIVE PERCENT: 65.5 %
PLATELET COUNT: 276 10*9/L (ref 150–450)
RED BLOOD CELL COUNT: 3.99 10*12/L (ref 3.95–5.13)
RED CELL DISTRIBUTION WIDTH: 17.1 % — ABNORMAL HIGH (ref 12.2–15.2)
WBC ADJUSTED: 9.3 10*9/L (ref 3.6–11.2)

## 2022-12-02 LAB — BLOOD GAS CRITICAL CARE PANEL, ARTERIAL
BASE EXCESS ARTERIAL: 3.8 — ABNORMAL HIGH (ref -2.0–2.0)
BASE EXCESS ARTERIAL: 8.8 — ABNORMAL HIGH (ref -2.0–2.0)
BASE EXCESS ARTERIAL: 10.7 — ABNORMAL HIGH (ref -2.0–2.0)
CALCIUM IONIZED ARTERIAL (MG/DL): 4.17 mg/dL — ABNORMAL LOW (ref 4.40–5.40)
CALCIUM IONIZED ARTERIAL (MG/DL): 4.51 mg/dL (ref 4.40–5.40)
CALCIUM IONIZED ARTERIAL (MG/DL): 4.68 mg/dL (ref 4.40–5.40)
GLUCOSE WHOLE BLOOD: 48 mg/dL — CL (ref 70–179)
GLUCOSE WHOLE BLOOD: 149 mg/dL (ref 70–179)
GLUCOSE WHOLE BLOOD: 115 mg/dL (ref 70–179)
HCO3 ARTERIAL: 30 mmol/L — ABNORMAL HIGH (ref 22–27)
HCO3 ARTERIAL: 34 mmol/L — ABNORMAL HIGH (ref 22–27)
HCO3 ARTERIAL: 37 mmol/L — ABNORMAL HIGH (ref 22–27)
HEMOGLOBIN BLOOD GAS: 9.2 g/dL — ABNORMAL LOW (ref 12.00–16.00)
HEMOGLOBIN BLOOD GAS: 11.1 g/dL — ABNORMAL LOW (ref 12.00–16.00)
HEMOGLOBIN BLOOD GAS: 11.4 g/dL — ABNORMAL LOW (ref 12.00–16.00)
LACTATE BLOOD ARTERIAL: 0.6 mmol/L (ref <1.3)
LACTATE BLOOD ARTERIAL: 1.6 mmol/L — ABNORMAL HIGH (ref <1.3)
LACTATE BLOOD ARTERIAL: 1.1 mmol/L (ref <1.3)
O2 SATURATION ARTERIAL: 98.6 % (ref 94.0–100.0)
O2 SATURATION ARTERIAL: 90.2 % — ABNORMAL LOW (ref 94.0–100.0)
O2 SATURATION ARTERIAL: 91.6 % — ABNORMAL LOW (ref 94.0–100.0)
PCO2 ARTERIAL: 49.8 mmHg — ABNORMAL HIGH (ref 35.0–45.0)
PCO2 ARTERIAL: 51 mmHg — ABNORMAL HIGH (ref 35.0–45.0)
PCO2 ARTERIAL: 55 mmHg — ABNORMAL HIGH (ref 35.0–45.0)
PH ARTERIAL: 7.38 (ref 7.35–7.45)
PH ARTERIAL: 7.44 (ref 7.35–7.45)
PH ARTERIAL: 7.43 (ref 7.35–7.45)
PO2 ARTERIAL: 107 mmHg (ref 80.0–110.0)
PO2 ARTERIAL: 58 mmHg — ABNORMAL LOW (ref 80.0–110.0)
PO2 ARTERIAL: 61.6 mmHg — ABNORMAL LOW (ref 80.0–110.0)
POTASSIUM WHOLE BLOOD: 2.9 mmol/L — ABNORMAL LOW (ref 3.4–4.6)
POTASSIUM WHOLE BLOOD: 3.1 mmol/L — ABNORMAL LOW (ref 3.4–4.6)
POTASSIUM WHOLE BLOOD: 3.6 mmol/L (ref 3.4–4.6)
SODIUM WHOLE BLOOD: 147 mmol/L — ABNORMAL HIGH (ref 135–145)
SODIUM WHOLE BLOOD: 145 mmol/L (ref 135–145)
SODIUM WHOLE BLOOD: 144 mmol/L (ref 135–145)

## 2022-12-02 LAB — COMPREHENSIVE METABOLIC PANEL
ALBUMIN: 2.9 g/dL — ABNORMAL LOW (ref 3.4–5.0)
ALKALINE PHOSPHATASE: 72 U/L (ref 46–116)
ALT (SGPT): 7 U/L — ABNORMAL LOW (ref 10–49)
ANION GAP: 2 mmol/L — ABNORMAL LOW (ref 5–14)
AST (SGOT): 11 U/L (ref <=34)
BILIRUBIN TOTAL: 0.3 mg/dL (ref 0.3–1.2)
BLOOD UREA NITROGEN: 50 mg/dL — ABNORMAL HIGH (ref 9–23)
BUN / CREAT RATIO: 34
CALCIUM: 9.2 mg/dL (ref 8.7–10.4)
CHLORIDE: 105 mmol/L (ref 98–107)
CO2: 35 mmol/L — ABNORMAL HIGH (ref 20.0–31.0)
CREATININE: 1.45 mg/dL — ABNORMAL HIGH
EGFR CKD-EPI (2021) FEMALE: 42 mL/min/{1.73_m2} — ABNORMAL LOW (ref >=60)
GLUCOSE RANDOM: 64 mg/dL — ABNORMAL LOW (ref 70–179)
POTASSIUM: 3.8 mmol/L (ref 3.4–4.8)
PROTEIN TOTAL: 7 g/dL (ref 5.7–8.2)
SODIUM: 142 mmol/L (ref 135–145)

## 2022-12-02 LAB — BASIC METABOLIC PANEL
ANION GAP: 5 mmol/L (ref 5–14)
BLOOD UREA NITROGEN: 46 mg/dL — ABNORMAL HIGH (ref 9–23)
BUN / CREAT RATIO: 34
CALCIUM: 9.1 mg/dL (ref 8.7–10.4)
CHLORIDE: 101 mmol/L (ref 98–107)
CO2: 33 mmol/L — ABNORMAL HIGH (ref 20.0–31.0)
CREATININE: 1.36 mg/dL — ABNORMAL HIGH
EGFR CKD-EPI (2021) FEMALE: 45 mL/min/{1.73_m2} — ABNORMAL LOW (ref >=60)
GLUCOSE RANDOM: 115 mg/dL (ref 70–179)
POTASSIUM: 3.6 mmol/L (ref 3.4–4.8)
SODIUM: 139 mmol/L (ref 135–145)

## 2022-12-02 LAB — PHOSPHORUS: PHOSPHORUS: 3.4 mg/dL (ref 2.4–5.1)

## 2022-12-02 LAB — MAGNESIUM: MAGNESIUM: 1.9 mg/dL (ref 1.6–2.6)

## 2022-12-02 LAB — PRO-BNP: PRO-BNP: 2411 pg/mL — ABNORMAL HIGH (ref <=300.0)

## 2022-12-02 MED ADMIN — gabapentin (NEURONTIN) capsule 300 mg: 300 mg | ORAL | @ 17:00:00

## 2022-12-02 MED ADMIN — insulin lispro (HumaLOG) injection 0-20 Units: 0-20 [IU] | SUBCUTANEOUS | @ 01:00:00

## 2022-12-02 MED ADMIN — potassium chloride (KLOR-CON) packet 40 mEq: 40 meq | ORAL | @ 02:00:00 | Stop: 2022-12-01

## 2022-12-02 MED ADMIN — gabapentin (NEURONTIN) capsule 300 mg: 300 mg | ORAL | @ 01:00:00

## 2022-12-02 MED ADMIN — furosemide (LASIX) injection 40 mg: 40 mg | INTRAVENOUS | @ 21:00:00 | Stop: 2022-12-02

## 2022-12-02 MED ADMIN — furosemide (LASIX) injection 40 mg: 40 mg | INTRAVENOUS | @ 04:00:00 | Stop: 2022-12-01

## 2022-12-02 MED ADMIN — ipratropium (ATROVENT) 0.02 % nebulizer solution 500 mcg: 500 ug | RESPIRATORY_TRACT | @ 12:00:00

## 2022-12-02 MED ADMIN — sildenafiL (pulm.hypertension) (REVATIO) tablet 20 mg: 20 mg | ORAL | @ 17:00:00

## 2022-12-02 MED ADMIN — furosemide (LASIX) injection 40 mg: 40 mg | INTRAVENOUS | @ 17:00:00

## 2022-12-02 MED ADMIN — budesonide (PULMICORT) nebulizer solution 0.5 mg: .5 mg | RESPIRATORY_TRACT | @ 01:00:00

## 2022-12-02 MED ADMIN — sildenafiL (pulm.hypertension) (REVATIO) tablet 20 mg: 20 mg | ORAL | @ 13:00:00

## 2022-12-02 MED ADMIN — dextrose (D10W) 10% bolus 125 mL: 12.5 g | INTRAVENOUS | @ 08:00:00 | Stop: 2023-11-29

## 2022-12-02 MED ADMIN — insulin NPH (HumuLIN,NovoLIN) injection 25 Units: 25 [IU] | SUBCUTANEOUS | @ 01:00:00

## 2022-12-02 MED ADMIN — insulin NPH (HumuLIN,NovoLIN) injection 16 Units: 16 [IU] | SUBCUTANEOUS | @ 16:00:00

## 2022-12-02 MED ADMIN — sodium chloride 3 % NEBULIZER solution 4 mL: 4 mL | RESPIRATORY_TRACT | @ 12:00:00

## 2022-12-02 MED ADMIN — budesonide (PULMICORT) nebulizer solution 0.5 mg: .5 mg | RESPIRATORY_TRACT | @ 12:00:00

## 2022-12-02 MED ADMIN — albuterol 2.5 mg /3 mL (0.083 %) nebulizer solution 2.5 mg: 2.5 mg | RESPIRATORY_TRACT | @ 21:00:00

## 2022-12-02 MED ADMIN — ipratropium (ATROVENT) 0.02 % nebulizer solution 500 mcg: 500 ug | RESPIRATORY_TRACT | @ 01:00:00

## 2022-12-02 MED ADMIN — gabapentin (NEURONTIN) capsule 300 mg: 300 mg | ORAL | @ 13:00:00

## 2022-12-02 MED ADMIN — arformoterol (BROVANA) nebulizer solution 15 mcg/2 mL: 15 ug | RESPIRATORY_TRACT | @ 01:00:00

## 2022-12-02 MED ADMIN — azithromycin (ZITHROMAX) tablet 250 mg: 250 mg | ORAL | @ 13:00:00 | Stop: 2025-08-25

## 2022-12-02 MED ADMIN — levoFLOXacin (LEVAQUIN) tablet 750 mg: 750 mg | ORAL | @ 13:00:00 | Stop: 2022-12-08

## 2022-12-02 MED ADMIN — posaconazole (NOXAFIL) delayed released tablet 200 mg: 200 mg | ORAL | @ 13:00:00

## 2022-12-02 MED ADMIN — sodium chloride 3 % NEBULIZER solution 4 mL: 4 mL | RESPIRATORY_TRACT | @ 01:00:00

## 2022-12-02 MED ADMIN — enoxaparin (LOVENOX) syringe 40 mg: 40 mg | SUBCUTANEOUS | @ 01:00:00

## 2022-12-02 MED ADMIN — furosemide (LASIX) injection 40 mg: 40 mg | INTRAVENOUS | @ 10:00:00

## 2022-12-02 MED ADMIN — sildenafiL (pulm.hypertension) (REVATIO) tablet 20 mg: 20 mg | ORAL | @ 01:00:00

## 2022-12-02 MED ADMIN — arformoterol (BROVANA) nebulizer solution 15 mcg/2 mL: 15 ug | RESPIRATORY_TRACT | @ 12:00:00

## 2022-12-02 NOTE — Unmapped (Signed)
Pt remains on HFNC. 45%. Pt had low BG of 48 at 0400. Juice and d10 given. Provider notified.       Problem: Adult Inpatient Plan of Care  Goal: Plan of Care Review  Outcome: Progressing  Goal: Patient-Specific Goal (Individualized)  Outcome: Progressing  Goal: Absence of Hospital-Acquired Illness or Injury  Outcome: Progressing  Intervention: Identify and Manage Fall Risk  Recent Flowsheet Documentation  Taken 12/02/2022 0000 by Erroll Luna, RN  Safety Interventions: environmental modification  Taken 12/01/2022 2200 by Erroll Luna, RN  Safety Interventions: environmental modification  Taken 12/01/2022 2000 by Erroll Luna, RN  Safety Interventions: environmental modification  Intervention: Prevent Skin Injury  Recent Flowsheet Documentation  Taken 12/02/2022 0000 by Erroll Luna, RN  Positioning for Skin: Other (Comment)  Device Skin Pressure Protection: positioning supports utilized  Skin Protection: adhesive use limited  Taken 12/01/2022 2200 by Erroll Luna, RN  Positioning for Skin: Other (Comment)  Device Skin Pressure Protection: positioning supports utilized  Skin Protection: adhesive use limited  Taken 12/01/2022 2000 by Erroll Luna, RN  Positioning for Skin: Other (Comment)  Device Skin Pressure Protection: positioning supports utilized  Skin Protection: adhesive use limited  Intervention: Prevent and Manage VTE (Venous Thromboembolism) Risk  Recent Flowsheet Documentation  Taken 12/02/2022 0000 by Erroll Luna, RN  VTE Prevention/Management: anticoagulant therapy  Taken 12/01/2022 2000 by Erroll Luna, RN  VTE Prevention/Management: anticoagulant therapy  Intervention: Prevent Infection  Recent Flowsheet Documentation  Taken 12/02/2022 0000 by Erroll Luna, RN  Infection Prevention: rest/sleep promoted  Taken 12/01/2022 2200 by Erroll Luna, RN  Infection Prevention: rest/sleep promoted  Taken 12/01/2022 2000 by Erroll Luna, RN  Infection Prevention: rest/sleep promoted  Goal: Optimal Comfort and Wellbeing  Outcome: Progressing  Goal: Readiness for Transition of Care  Outcome: Progressing  Goal: Rounds/Family Conference  Outcome: Progressing

## 2022-12-02 NOTE — Unmapped (Addendum)
MICU Evening Summary     Date of Service: 12/01/2022    Interval History: Karen Barber is a 57 y.o. female with pmhx of stage IV sarcoidosis, bullous disease, bronchiectasis, pulmonary hypertension, chronic hypoxemic respiratory failure who presents to the ICU with acute on chronic hypoxemic respiratory failure.     Assessment & Plan     - Remains on HFNC, 50%/45L. Denies significant dyspnea on exam, says that she is overall comfortable.   - Continues to desaturate with minimal movements, high 80s while talking and moving in bed.   - She is on her home equivalent of lasix (home lasix 80 mg BID, on IV lasix 40 mg BID) with good diuresis. UOP 1.7L and is net neg 1.1L.   - Potassium 3.4, repleted with 40 of Kcl.   - Patient with worsening tachypnea and dyspnea.   - Additional IV lasix 40 mg x 1 given (around midnight) for overall goal net neg 1.5-2L   - Continues on levaquin. Micro data negative.   - Glucose remains labile, getting covered with insulin.    Princess Bruins, MD  Pulmonary and Critical Care Fellow

## 2022-12-02 NOTE — Unmapped (Signed)
Interventional Pulmonary Initial Consult Note     Date of Service: 12/02/2022  Requesting Physician: Montine Circle, MD   Requesting Service: Medical ICU (MDI)  Reason for consultation: Evaluation for endobronchial intervention for LUL bullae     Assessment/Plan     Impression: Karen Barber is an 57 y.o. female with PMHx notable for severe sarcoidosis with obstructive lung disease, bronchiectasis, pulmonary hypertension who is currently admitted with acute on chronic hypoxemic respiratory failure. IP asked to evaluate for possible endobronchial intervention for large left sided bullae. In reviewing serial CT imaging there has been increase in size of the apical left bullae. Evidence for endobronchial valves (EBV) is primarily derived from its use in BLVR population. In this setting large bullae are felt to be contraindications to [facility] as the fragile parenchymal tissue surrounding the bullae increases risk of pneumothorax with persistent air leak if atelectasis is achieved. Additionally, there is high risk of lack of atelectasis of bullous region as it is most likely dead space. Without effective ventilation of target lung region, EBV will not produce atelectasis. Separate from the uncertain efficacy of EBV placement in this setting, I am concerned about Karen Barber's ability to tolerate general anesthesia and intubation required for bronchoscopic assessment. In this context, would not recommend attempt at EBV placement. I have discussed this with Dr. Colon Branch and the MICU team.     Please page the Interventional Pulmonology pager at [phone] with any questions and notify the IP service with discharge plans to ensure the patient has appropriate IP follow up. We appreciate the opportunity to assist in the care of this patient. We will sign off at this time.    I personally spent 65 minutes face-to-face and non-face-to-face in the care of this patient, which includes all pre, intra, and post visit time on the date of service.  All documented time was specific to the E/M visit and does not include any procedures that may have been performed.    Shelia Media, MD    Subjective & Objective     Hospital Problems:  Principal Problem:    Acute on chronic hypoxic respiratory failure (CMS-HCC)  Active Problems:    Pulmonary sarcoidosis (CMS-HCC)    Type 2 diabetes mellitus with hyperglycemia (CMS-HCC)    COPD (chronic obstructive pulmonary disease) (CMS-HCC)    Chronic pulmonary aspergillosis (CMS-HCC)    Pulmonary hypertension (CMS-HCC)    AKI (acute kidney injury) (CMS-HCC)      HPI: Karen Barber is a 57 y.o. 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     She has a history of a left upper lobe lobectomy (initially planned as VATS but  was converted to open thoracotomy) which was complicated by left main PA injury s/p bovine pericardium patch repair (11/2014).     Her baseline home oxygen requirement is reportedly 6L, she is currently requiring HFNC. She has severe reduction in her DLCO (Z-score: -19.5)       Vitals - past 24 hours  Temp:  [35.4 ??C (95.7 ??F)-36.7 ??C (98 ??F)] 36.3 ??C (97.3 ??F)  Heart Rate:  [70-98] 81  SpO2 Pulse:  [70-94] 77  Resp:  [15-41] 31  FiO2 (%):  [45 %-60 %] 45 %  SpO2:  [90 %-100 %] 95 % Intake/Output  I/O last 3 completed shifts:  In: 665 [P.O.:520; I.V.:20; IV Piggyback:125]  Out: 3350 [Urine:3350]      Pertinent exam findings:   General appearance - tired appearing, NAD  Eyes - EOMI, anicteric sclerae, pink conjunctiva  Neck - Trachea midline, normal neck movement  Heart - normal rate, regular rhythm,  Chest - HFNC, scattered crackles  Skin - normal coloration and turgor, no rashes, no suspicious skin lesions noted  Psych - alert, oriented, normal speech, normal affect  Neuro: no focal findings, moving all extremitiesspontaneously      Pertinent Imaging Data   - Personally reviewed in EMR    Pertinent Micro Data:  - Personally reviewed in EMR      Current vent settings:  FiO2 (%):  [45 %-60 %] 45 %  O2 Device: Heated high flow nasal cannula  O2 Flow Rate (L/min):  [45 L/min] 45 L/min    Arterial Blood Gas:   Recent Labs     Units 12/01/22  1929 12/02/22  0350 12/02/22  1127 12/02/22  1532   PHART  7.38 7.38 7.44 7.44   PCO2ART mm[Hg] 39.8 49.8* 51.0* 45.4*   PO2ART mm[Hg] 81.4 107.0 58.0* 56*   HCO3ART mmol/L 23 30* 34* 31.0*   BEART mmol/L -1.6 3.8* 8.8* 6.9*   O2SATART % 96.2 98.6 90.2* 89.7*        Venous Blood Gas:   No results for input(s): PHVEN, PCO2VEN, PO2VEN, HCO3VEN, BEVEN, O2SATVEN in the last 24 hours.     Cultures:  Blood Culture, Routine (no units)   Date Value   11/05/2022 No Growth at 5 days   11/05/2022 No Growth at 5 days     Lower Respiratory Culture (no units)   Date Value   11/08/2022 3+ Serratia marcescens (A)   11/08/2022 1+ Oropharyngeal Flora Isolated     WBC (10*9/L)   Date Value   12/02/2022 9.3          Other Labs:  Lab Results   Component Value Date    WBC 9.3 12/02/2022    HGB 10.2 (L) 12/02/2022    HCT 32.7 (L) 12/02/2022    PLT 276 12/02/2022     Lab Results   Component Value Date    NA 148 (H) 12/02/2022    K 3.0 (L) 12/02/2022    CL 105 12/02/2022    CO2 35.0 (H) 12/02/2022    BUN 50 (H) 12/02/2022    CREATININE 1.45 (H) 12/02/2022    GLU 64 (L) 12/02/2022    CALCIUM 9.2 12/02/2022    MG 1.9 12/02/2022    PHOS 3.4 12/02/2022     Lab Results   Component Value Date    BILITOT 0.3 12/02/2022    BILIDIR <0.10 05/30/2019    PROT 7.0 12/02/2022    ALBUMIN 2.9 (L) 12/02/2022    ALT 7 (L) 12/02/2022    AST 11 12/02/2022    ALKPHOS 72 12/02/2022    GGT 60 (H) 05/31/2011     Lab Results   Component Value Date    INR 1.02 12/11/2019    APTT 30.9 12/11/2019       Allergies & Home Medications Personally reviewed in Epic    Continuous Infusions:       Scheduled Medications:    arformoterol  15 mcg Nebulization BID (RT)    azithromycin  250 mg Oral Daily    budesonide  0.5 mg Nebulization BID (RT)    enoxaparin (LOVENOX) injection  40 mg  Subcutaneous Nightly    furosemide  40 mg Intravenous BID    gabapentin  300 mg Oral TID    insulin lispro  0-20 Units Subcutaneous ACHS    insulin NPH  10 Units Subcutaneous Q24H    insulin NPH  16 Units Subcutaneous Daily    ipratropium  500 mcg Nebulization BID (RT)    levoFLOXacin  750 mg Oral Every other day    polyethylene glycol  17 g Oral Daily    posaconazole  200 mg Oral Daily    sildenafiL (pulm.hypertension)  20 mg Oral TID    sodium chloride  4 mL Nebulization BID       PRN medications:  acetaminophen, albuterol, calcium carbonate, dextrose in water, glucagon, ondansetron **OR** ondansetron, senna, sodium chloride

## 2022-12-02 NOTE — Unmapped (Signed)
Pt trial on 6-8L oxymizer today, pt did not tolerated well. Pt desated to  85%. Pt placed back on HFNC 45L 50%. Pt took all breathing treatments as ordered.

## 2022-12-02 NOTE — Unmapped (Signed)
Pt remains on 45%/45L HFNC at this time to maintain O2sat 88-92%. PaO2 on ABGs has been mid 50s so no more titrations were made. Pt states her WOB is feeling okay. APP aware. She is able to ambulate with standy by assist around the room and has been stable in the chair for most of the day.  Appetite is probably inadequate, APP and pharmacist made aware for NPH dosing. UOP successful with IV lasix via purewick. Callbell is within reach. Please see flowsheets/MAR for vitals and trends.     Problem: Adult Inpatient Plan of Care  Goal: Plan of Care Review  Outcome: Not Progressing  Goal: Patient-Specific Goal (Individualized)  Outcome: Not Progressing  Goal: Absence of Hospital-Acquired Illness or Injury  Outcome: Not Progressing  Intervention: Identify and Manage Fall Risk  Recent Flowsheet Documentation  Taken 12/02/2022 0800 by Josephina Gip, RN  Safety Interventions:   aspiration precautions   commode/urinal/bedpan at bedside   fall reduction program maintained   lighting adjusted for tasks/safety   low bed  Intervention: Prevent Skin Injury  Recent Flowsheet Documentation  Taken 12/02/2022 1600 by Josephina Gip, RN  Positioning for Skin: Sitting in Chair  Taken 12/02/2022 1400 by Josephina Gip, RN  Positioning for Skin: Sitting in Chair  Taken 12/02/2022 1200 by Josephina Gip, RN  Positioning for Skin: Sitting in Chair  Taken 12/02/2022 1000 by Josephina Gip, RN  Positioning for Skin: Sitting in Chair  Taken 12/02/2022 0800 by Josephina Gip, RN  Positioning for Skin: (sitting up in bed) Other (Comment)  Device Skin Pressure Protection: absorbent pad utilized/changed  Skin Protection: adhesive use limited  Intervention: Prevent and Manage VTE (Venous Thromboembolism) Risk  Recent Flowsheet Documentation  Taken 12/02/2022 1600 by Josephina Gip, RN  Anti-Embolism Intervention: Refused  Taken 12/02/2022 1400 by Josephina Gip, RN  Anti-Embolism Intervention: Refused  Taken 12/02/2022 1200 by Josephina Gip, RN  Anti-Embolism Intervention: Refused  Taken 12/02/2022 1000 by Josephina Gip, RN  Anti-Embolism Intervention: Refused  Taken 12/02/2022 0800 by Josephina Gip, RN  Anti-Embolism Intervention: Refused  Intervention: Prevent Infection  Recent Flowsheet Documentation  Taken 12/02/2022 0800 by Josephina Gip, RN  Infection Prevention: environmental surveillance performed  Goal: Optimal Comfort and Wellbeing  Outcome: Not Progressing  Goal: Readiness for Transition of Care  Outcome: Not Progressing  Goal: Rounds/Family Conference  Outcome: Not Progressing     Problem: Fall Injury Risk  Goal: Absence of Fall and Fall-Related Injury  Outcome: Not Progressing  Intervention: Promote Injury-Free Environment  Recent Flowsheet Documentation  Taken 12/02/2022 0800 by Josephina Gip, RN  Safety Interventions:   aspiration precautions   commode/urinal/bedpan at bedside   fall reduction program maintained   lighting adjusted for tasks/safety   low bed     Problem: Gas Exchange Impaired  Goal: Optimal Gas Exchange  Outcome: Not Progressing  Intervention: Optimize Oxygenation and Ventilation  Recent Flowsheet Documentation  Taken 12/02/2022 1600 by Josephina Gip, RN  Head of Bed [facility]) Positioning: HOB at 60 degrees  Taken 12/02/2022 1200 by Josephina Gip, RN  Head of Bed [facility]) Positioning: HOB at 30-45 degrees  Taken 12/02/2022 0800 by Josephina Gip, RN  Head of Bed [facility]) Positioning: HOB at 30-45 degrees

## 2022-12-02 NOTE — Unmapped (Signed)
MICU Progress Note     Date of Service: 12/02/2022    Problem List:   Principal Problem:    Acute on chronic hypoxic respiratory failure (CMS-HCC)  Active Problems:    Pulmonary sarcoidosis (CMS-HCC)    Type 2 diabetes mellitus with hyperglycemia (CMS-HCC)    COPD (chronic obstructive pulmonary disease) (CMS-HCC)    Chronic pulmonary aspergillosis (CMS-HCC)    Pulmonary hypertension (CMS-HCC)    AKI (acute kidney injury) (CMS-HCC)      HPI: Karen Barber is a 57 y.o. female with a past medical history of bronchiectasis type 1 in the setting of pulmonary sarcoidosis (6L [place] home O2), HTN, T2DM, COPD, s/p LUL lobectomy, and asthma that initially presented today to the hospital for a PET scan to evaluate her sarcoidosis. To prepare for her appointment, she states that she was told to not take any of her home medicines, including her home sidenafil, lasix, nebulizers, and inhalers.     Upon presentation to the appointment, she was found to have an increased work of breathing and her SPO2 was in the 70s on her home [facility], prompting her to be evaluated in the ED. As her respiratory status did not improve with NRB, she was placed on HFNC. 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     Of note, the patient had a recent admission for pneumonia from 11/05/2022 to 11/12/2022, during which she was followed by the pulm consult team. While the patient was discharged on her baseline O2 need, the patient states that she never really returned to her baseline respiratory status. She endorses having ongoing shortness of breath since discharge and stated that she frequently would increase the flow of her [place] at home.     In the ED, the patient was without a fever or leukocytosis, lactate was non-elevated, electrolytes were within an appropriate range. She denied any recent fevers, chest pain, and no lower extremity leg swelling was appreciated. She received one dose of 125mg  of IV methylprednisolone and then was transferred to the MICU for further management of her hypoxia.     24hr events:   - Trial oxymizer yesterday 9/13 but unsuccessful iso continued to desat while on HFNC  - FiO2 requirement increased overnight HFNC 50%/50L, continued to desat w/minimal improvement, SpO2 hight 80s while talking and moving in bed. --- plan for [facility] today as tolerated  - She is on her home equivalent of lasix (home lasix 80 mg BID, on IV lasix 40 mg BID) with good diuresis. UOP 1.7L  - overnight patient with worsening tachypnea and dyspnea; additional IV lasix 40mg  IV x1 (around midnight) for overall net neg 2.3L (24hrs)   - low blood glucose overnight; NPH adjusted this am-->NPH 16U in am / NPH 10U pm    Neurological   Chronic Pain   - Continuing home gabapentin   - PT/OT consulted, appreciate recs     Analgesia: No pain issues  RASS at goal? N/A, not on sedation  [place] Agitation Assessment Scale (RASS) : 0 (12/02/2022  8:00 AM)       Pulmonary   Acute on chronic hypoxemic respiratory failure - Pulmonary Sarcoidosis - COPD  Patient with history of COPD, pulmonary sarcoidosis, and PAH (on 6L [place] at baseline) that presented to the hospital 9/11 with SPO2 in the 70s on home O2. To assist with her hypoxia, she was placed on HFNC. 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 Of note, patient endorses increasing the flow of her [place] frequently  when at home. This is a new change after her recent admission for pneumonia.   - Continue to wean O2 as able. May need to consider a new O2 setting for home. --> 50% HFNC  - Goal SPO2 > 90%  - Continue home AVAPS at night   - Continue home airway clearance in the setting of bronchiectasis (Aerobika and HTS neb). Of note, during previous admission, also used Pari Proneb Max (see note from Dr. Stasia Cavalier on 8/24). May need to consider.   - Continue home pulmicort, brovana, atrovent and home PRN albuterol   - CXR as above   - Received a one time dose of 125mg  of IV methylpred in the ED  - 9/13 plan to trial on oxymizer this am but patient continues to desat to mid 80's on HFNC so will hold off for now and reassess this afternoon  - Trial oxymizer yesterday 9/13 but unsuccessful iso continued to desat while on HFNC  - FiO2 requirement increased overnight HFNC 50%/50L, continued to desat w/minimal improvement, SpO2 hight 80s while talking and moving in bed. --plan for [facility] today (9/14) as tolerated    FiO2 (%):  [40 %-60 %] 45 %  O2 Device: Heated high flow nasal cannula  O2 Flow Rate (L/min):  [6 L/min-45 L/min] 45 L/min    Cardiovascular   Pulmonary hypertension - RV Failure  Patient with history of PAH. Last echo on 10/23/22 showing LVEF with normal size but increased wall thickness, LVED at > 55%, severe pulmonary HTN, RV severely dilated in size with reduced systolic function, RA mildly dilated.   - repeat Echo pending; plan to repeat on Monday 9/16 after diuresis  - Trend daily BNP   - At home takes lasix 80mg  BID. Plan was to reconsider dose post last admission. Holding home lasix for now and continuing with spot IV diuresis. (Given 1x dose of 40mg  IV on 9/11)  - Continue home sidenafil 40mg  TID  - Strict Is/Os and daily weights   - started lasix 40 IV bid   - She is on her home equivalent of lasix (home lasix 80 mg BID, on IV lasix 40 mg BID) with good diuresis. UOP 1.7L  - overnight patient with worsening tachypnea and dyspnea; additional IV lasix 40mg  IV x1 (around midnight) for overall net neg 2.3L (24hrs)     Renal   AKI on CKD3  Patient with baseline creatinine between 1.2-1.4. Admitted with creatinine at 1.8, likely in the setting of hypoxemia.   - Consider placement of foley catheter for closer I/O monitoring   - Monitoring electrolytes and creatinine with daily BMP   - Renally dose medications, avoid nephrotoxic agents     Infectious Disease/Autoimmune   Potential for Respiratory Infection   Patient without fever, leukocytosis, lactate elevation, however, presented with increased work of breathing, shortness of breath, and hypoxemia. CXR showing extensive pulmonary fibrosis, but unable to clearly evaluate for presence of pneumonia on film. Of note, patient admitted from 8/18-8/25 with Serratia pneumonia. There was concern for potential chronic aspiration. Planned to have outpatient barium swallow to evaluate esophagus.   - Started on vanc/cefepime for broad coverage --> stopped vanc and cefepime, started levaquin (9/12----  - MRSA screen neg  - RPP neg   - Will send off sputum sample if able   - continued azithromycin (9/12---    Chronic pulmonary aspergillosis   Continue home posa ppx    Cultures:  Blood Culture, Routine (no units)   Date Value  11/05/2022 No Growth at 5 days   11/05/2022 No Growth at 5 days     Lower Respiratory Culture (no units)   Date Value   11/08/2022 3+ Serratia marcescens (A)   11/08/2022 1+ Oropharyngeal Flora Isolated     WBC (10*9/L)   Date Value   12/02/2022 9.3          FEN/GI   Nutrition:   - reg diet    - No indication for GI ppx at this time   - Miralax daily and senna PRN   - barium swallow pending for prior c/f aspiration events-->hold off for now w/barium swallow; patient may follow up outpatient    Provider Malnutrition Assessment:  Body mass index is 25.89 kg/m??.  GLIM criteria:   Pt does not meet criteria  -I have screened this patient for malnutrition and they did NOT meet criteria for malnutrition based on GLIM criteria.  -Nutrition consulted no    Heme/Coag   Chronic Anemia, likely IDA   Iron deficiency anemia is associated with pulmonary hypertension in multiple studies.   - H/H up from last admission, appropriate at this time   - Platelets appropiate   - Continue lovenox ppx (may need to switch to heparin if AKI worsens)   - Repeating iron studies     Endocrine   Type 2 diabetes mellitus with hyperglycemia   HgbA1c 8.3% July 2024, Home regimen of glargine 18 units nightly, sliding scale insulin.  - NPH 15 bid w/ SSI, low threshold to increase--> 9/12 NPH increased to 25U BID  - 9/13 low blood glucose overnight; NPH adjusted this am-->9/14 NPH 16U in am / NPH 10U pm    Integumentary   NAI  - WOCN consulted for high risk skin assessment No. Reason: not indicated .  - cont pressure mitigating precautions per skin policy    Prophylaxis/LDA/Restraints/Consults   ICU Checklist completed: yes (see ICU rounding navigator in Epic)      Patient Lines/Drains/Airways Status       Active Active Lines, Drains, & Airways       Name Placement date Placement time Site Days    External Urinary Device 11/06/22 With Suction 11/06/22  0800  -- 26    Peripheral IV 11/29/22 Left;Posterior Hand 11/29/22  1947  Hand  2    Peripheral IV 11/30/22 Right Forearm 11/30/22  0300  Forearm  2    Arterial Line 11/29/22 Left Radial 11/29/22  2108  Radial  2                  Patient Lines/Drains/Airways Status       Active Wounds       None                    Goals of Care     Code Status:  Full Code.     Designated Chiropodist Maker:  Ms. Leazer current decisional capacity for healthcare decision-making is Full capacity. Her designated Educational psychologist) is/are   HCDM (patient stated preference): [name] - Media planner - [phone].      Subjective   No complaints reported this morning    Objective     Vitals - past 24 hours  Temp:  [35.4 ??C (95.7 ??F)-36.7 ??C (98 ??F)] 36.3 ??C (97.3 ??F)  Heart Rate:  [70-99] 74  SpO2 Pulse:  [70-100] 74  Resp:  [16-38] 26  FiO2 (%):  [40 %-60 %] 45 %  SpO2:  [77 %-  100 %] 98 % Intake/Output  I/O last 3 completed shifts:  In: 665 [P.O.:520; I.V.:20; IV Piggyback:125]  Out: 3350 [Urine:3350]     Physical Exam:    General: sitting upright in bed, tachypneic, in NAD  HEENT: head atraumatic, normocephalic. PERRL. Sclera non-icteric.   CV: regular rhythm. No m/g/r. Capillary refill less than 3 seconds in all extremities.   Pulm: breath sounds diminished throughout lung fields bilaterally, mild crackles noted in bases bilaterally. Chest expansion symmetrical. No wheezing.   GI: Abdomen is soft/non-tender/non-distended. Active bowel sounds in all quadrants.   MSK: ROM full in all extremities.   Skin: No rashes of lesions noted   Neuro: Alert, oriented to person/place/time, follows commands in all extremities.     The patient's hospital stay has been complicated by the following clinically significant conditions requiring additional evaluation and treatment or having a significant effect of this patient's care: - Anemia POA requiring further investigation or monitoring  - Chronic kidney disease POA requiring further investigation, treatment, or monitoring    Body mass index is 25.89 kg/m??.            Wt Readings from Last 12 Encounters:   11/30/22 84.2 kg (185 lb 10 oz)   11/11/22 90.1 kg (198 lb 10.2 oz)   10/31/22 84.8 kg (187 lb)   10/22/22 84 kg (185 lb 3 oz)   09/25/22 83.9 kg (185 lb)   06/12/22 89.8 kg (198 lb)   05/16/22 89.4 kg (197 lb)   06/29/21 94.8 kg (208 lb 15.9 oz)   04/19/21 94.8 kg (209 lb)   11/02/20 98 kg (216 lb)   07/20/20 97.1 kg (214 lb)   06/08/20 97.1 kg (214 lb)       Continuous Infusions:       Scheduled Medications:    arformoterol  15 mcg Nebulization BID (RT)    azithromycin  250 mg Oral Daily    budesonide  0.5 mg Nebulization BID (RT)    enoxaparin (LOVENOX) injection  40 mg Subcutaneous Nightly    furosemide  40 mg Intravenous BID    gabapentin  300 mg Oral TID    insulin lispro  0-20 Units Subcutaneous ACHS    insulin NPH  10 Units Subcutaneous Q24H    insulin NPH  16 Units Subcutaneous Daily    ipratropium  500 mcg Nebulization BID (RT)    levoFLOXacin  750 mg Oral Every other day    polyethylene glycol  17 g Oral Daily    posaconazole  200 mg Oral Daily    sildenafiL (pulm.hypertension)  20 mg Oral TID    sodium chloride  4 mL Nebulization BID       PRN medications:  acetaminophen, albuterol, calcium carbonate, dextrose in water, glucagon, ondansetron **OR** ondansetron, senna, sodium chloride    Data/Imaging Review: Reviewed in Epic and personally interpreted on 12/02/2022. See EMR for detailed results.       Critical Care Attestation     This patient is critically ill or injured with the impairment of vital organ systems such that there is a high probability of imminent or life threatening deterioration in the patient's condition. This patient must remain in the ICU for ongoing evaluation of the comprehensive management plan outlined in this note. I directly provided critical care services as documented in this note and the critical care time spent (40 min) is exclusive of separately billable procedures.  In addition to time spent for critical care management, I also provided advance  care planning services for  0  minutes (see GOC above or ACP note for details)???. Total billable critical care time 40 minutes.    Arlyn Dunning, AGNP

## 2022-12-03 LAB — BLOOD GAS CRITICAL CARE PANEL, ARTERIAL
BASE EXCESS ARTERIAL: 9.8 — ABNORMAL HIGH (ref -2.0–2.0)
BASE EXCESS ARTERIAL: 7.7 — ABNORMAL HIGH (ref -2.0–2.0)
BASE EXCESS ARTERIAL: 9.2 — ABNORMAL HIGH (ref -2.0–2.0)
CALCIUM IONIZED ARTERIAL (MG/DL): 4.59 mg/dL (ref 4.40–5.40)
CALCIUM IONIZED ARTERIAL (MG/DL): 4.74 mg/dL (ref 4.40–5.40)
CALCIUM IONIZED ARTERIAL (MG/DL): 4.97 mg/dL (ref 4.40–5.40)
GLUCOSE WHOLE BLOOD: 118 mg/dL (ref 70–179)
GLUCOSE WHOLE BLOOD: 201 mg/dL — ABNORMAL HIGH (ref 70–179)
GLUCOSE WHOLE BLOOD: 138 mg/dL (ref 70–179)
HCO3 ARTERIAL: 36 mmol/L — ABNORMAL HIGH (ref 22–27)
HCO3 ARTERIAL: 34 mmol/L — ABNORMAL HIGH (ref 22–27)
HCO3 ARTERIAL: 34 mmol/L — ABNORMAL HIGH (ref 22–27)
HEMOGLOBIN BLOOD GAS: 10.4 g/dL — ABNORMAL LOW (ref 12.00–16.00)
HEMOGLOBIN BLOOD GAS: 11.2 g/dL — ABNORMAL LOW (ref 12.00–16.00)
HEMOGLOBIN BLOOD GAS: 11.1 g/dL — ABNORMAL LOW
LACTATE BLOOD ARTERIAL: 0.9 mmol/L (ref <1.3)
LACTATE BLOOD ARTERIAL: 1.6 mmol/L — ABNORMAL HIGH (ref <1.3)
LACTATE BLOOD ARTERIAL: 1 mmol/L (ref <1.3)
O2 SATURATION ARTERIAL: 82.8 % — ABNORMAL LOW (ref 94.0–100.0)
O2 SATURATION ARTERIAL: 99.4 % (ref 94.0–100.0)
O2 SATURATION ARTERIAL: 94.1 % (ref 94.0–100.0)
PCO2 ARTERIAL: 54.1 mmHg — ABNORMAL HIGH (ref 35.0–45.0)
PCO2 ARTERIAL: 58.6 mmHg — ABNORMAL HIGH (ref 35.0–45.0)
PCO2 ARTERIAL: 53.1 mmHg — ABNORMAL HIGH (ref 35.0–45.0)
PH ARTERIAL: 7.43 (ref 7.35–7.45)
PH ARTERIAL: 7.38 (ref 7.35–7.45)
PH ARTERIAL: 7.42 (ref 7.35–7.45)
PO2 ARTERIAL: 48 mmHg — ABNORMAL LOW (ref 80.0–110.0)
PO2 ARTERIAL: 133 mmHg — ABNORMAL HIGH (ref 80.0–110.0)
PO2 ARTERIAL: 70 mmHg — ABNORMAL LOW (ref 80.0–110.0)
POTASSIUM WHOLE BLOOD: 3.5 mmol/L (ref 3.4–4.6)
POTASSIUM WHOLE BLOOD: 4 mmol/L (ref 3.4–4.6)
POTASSIUM WHOLE BLOOD: 4.3 mmol/L (ref 3.4–4.6)
SODIUM WHOLE BLOOD: 143 mmol/L (ref 135–145)
SODIUM WHOLE BLOOD: 143 mmol/L (ref 135–145)
SODIUM WHOLE BLOOD: 147 mmol/L — ABNORMAL HIGH (ref 135–145)

## 2022-12-03 LAB — CBC W/ AUTO DIFF
BASOPHILS ABSOLUTE COUNT: 0 10*9/L (ref 0.0–0.1)
BASOPHILS RELATIVE PERCENT: 0.5 %
EOSINOPHILS ABSOLUTE COUNT: 0.8 10*9/L — ABNORMAL HIGH (ref 0.0–0.5)
EOSINOPHILS RELATIVE PERCENT: 11.1 %
HEMATOCRIT: 32.3 % — ABNORMAL LOW (ref 34.0–44.0)
HEMOGLOBIN: 10.5 g/dL — ABNORMAL LOW (ref 11.3–14.9)
LYMPHOCYTES ABSOLUTE COUNT: 1.3 10*9/L (ref 1.1–3.6)
LYMPHOCYTES RELATIVE PERCENT: 18.2 %
MEAN CORPUSCULAR HEMOGLOBIN CONC: 32.6 g/dL (ref 32.0–36.0)
MEAN CORPUSCULAR HEMOGLOBIN: 26.4 pg (ref 25.9–32.4)
MEAN CORPUSCULAR VOLUME: 81.1 fL (ref 77.6–95.7)
MEAN PLATELET VOLUME: 8.4 fL (ref 6.8–10.7)
MONOCYTES ABSOLUTE COUNT: 0.9 10*9/L — ABNORMAL HIGH (ref 0.3–0.8)
MONOCYTES RELATIVE PERCENT: 12 %
NEUTROPHILS ABSOLUTE COUNT: 4.2 10*9/L (ref 1.8–7.8)
NEUTROPHILS RELATIVE PERCENT: 58.2 %
PLATELET COUNT: 263 10*9/L (ref 150–450)
RED BLOOD CELL COUNT: 3.98 10*12/L (ref 3.95–5.13)
RED CELL DISTRIBUTION WIDTH: 16.9 % — ABNORMAL HIGH (ref 12.2–15.2)
WBC ADJUSTED: 7.2 10*9/L (ref 3.6–11.2)

## 2022-12-03 LAB — COMPREHENSIVE METABOLIC PANEL
ALBUMIN: 2.7 g/dL — ABNORMAL LOW (ref 3.4–5.0)
ALKALINE PHOSPHATASE: 66 U/L (ref 46–116)
ALT (SGPT): <7 U/L — ABNORMAL LOW (ref 10–49)
ANION GAP: 5 mmol/L (ref 5–14)
AST (SGOT): 15 U/L (ref <=34)
BILIRUBIN TOTAL: 0.3 mg/dL (ref 0.3–1.2)
BLOOD UREA NITROGEN: 43 mg/dL — ABNORMAL HIGH (ref 9–23)
BUN / CREAT RATIO: 32
CALCIUM: 8.6 mg/dL — ABNORMAL LOW (ref 8.7–10.4)
CHLORIDE: 104 mmol/L (ref 98–107)
CO2: 32 mmol/L — ABNORMAL HIGH (ref 20.0–31.0)
CREATININE: 1.36 mg/dL — ABNORMAL HIGH
EGFR CKD-EPI (2021) FEMALE: 45 mL/min/{1.73_m2} — ABNORMAL LOW (ref >=60)
GLUCOSE RANDOM: 117 mg/dL (ref 70–179)
POTASSIUM: 3.6 mmol/L (ref 3.4–4.8)
PROTEIN TOTAL: 6.4 g/dL (ref 5.7–8.2)
SODIUM: 141 mmol/L (ref 135–145)

## 2022-12-03 LAB — MAGNESIUM: MAGNESIUM: 1.7 mg/dL (ref 1.6–2.6)

## 2022-12-03 LAB — PRO-BNP: PRO-BNP: 2289 pg/mL — ABNORMAL HIGH (ref <=300.0)

## 2022-12-03 LAB — BASIC METABOLIC PANEL
ANION GAP: 2 mmol/L — ABNORMAL LOW (ref 5–14)
BLOOD UREA NITROGEN: 47 mg/dL — ABNORMAL HIGH (ref 9–23)
BUN / CREAT RATIO: 34
CALCIUM: 8.9 mg/dL (ref 8.7–10.4)
CHLORIDE: 104 mmol/L (ref 98–107)
CO2: 36 mmol/L — ABNORMAL HIGH (ref 20.0–31.0)
CREATININE: 1.37 mg/dL — ABNORMAL HIGH
EGFR CKD-EPI (2021) FEMALE: 45 mL/min/{1.73_m2} — ABNORMAL LOW (ref >=60)
GLUCOSE RANDOM: 133 mg/dL (ref 70–179)
POTASSIUM: 4.4 mmol/L (ref 3.4–4.8)
SODIUM: 142 mmol/L (ref 135–145)

## 2022-12-03 LAB — PHOSPHORUS: PHOSPHORUS: 3.7 mg/dL (ref 2.4–5.1)

## 2022-12-03 MED ADMIN — ipratropium (ATROVENT) 0.02 % nebulizer solution 500 mcg: 500 ug | RESPIRATORY_TRACT | @ 12:00:00

## 2022-12-03 MED ADMIN — insulin lispro (HumaLOG) injection 0-20 Units: 0-20 [IU] | SUBCUTANEOUS | @ 20:00:00

## 2022-12-03 MED ADMIN — gabapentin (NEURONTIN) capsule 300 mg: 300 mg | ORAL | @ 13:00:00

## 2022-12-03 MED ADMIN — sodium chloride 3 % NEBULIZER solution 4 mL: 4 mL | RESPIRATORY_TRACT | @ 12:00:00

## 2022-12-03 MED ADMIN — arformoterol (BROVANA) nebulizer solution 15 mcg/2 mL: 15 ug | RESPIRATORY_TRACT | @ 01:00:00

## 2022-12-03 MED ADMIN — azithromycin (ZITHROMAX) tablet 250 mg: 250 mg | ORAL | @ 13:00:00 | Stop: 2025-08-25

## 2022-12-03 MED ADMIN — sodium chloride 3 % NEBULIZER solution 4 mL: 4 mL | RESPIRATORY_TRACT | @ 01:00:00

## 2022-12-03 MED ADMIN — insulin NPH (HumuLIN,NovoLIN) injection 10 Units: 10 [IU] | SUBCUTANEOUS | @ 01:00:00

## 2022-12-03 MED ADMIN — posaconazole (NOXAFIL) delayed released tablet 200 mg: 200 mg | ORAL | @ 13:00:00

## 2022-12-03 MED ADMIN — furosemide (LASIX) injection 40 mg: 40 mg | INTRAVENOUS | @ 10:00:00 | Stop: 2022-12-03

## 2022-12-03 MED ADMIN — arformoterol (BROVANA) nebulizer solution 15 mcg/2 mL: 15 ug | RESPIRATORY_TRACT | @ 12:00:00

## 2022-12-03 MED ADMIN — ipratropium (ATROVENT) 0.02 % nebulizer solution 500 mcg: 500 ug | RESPIRATORY_TRACT | @ 01:00:00

## 2022-12-03 MED ADMIN — gabapentin (NEURONTIN) capsule 300 mg: 300 mg | ORAL | @ 18:00:00

## 2022-12-03 MED ADMIN — sildenafiL (pulm.hypertension) (REVATIO) tablet 40 mg: 40 mg | ORAL | @ 18:00:00

## 2022-12-03 MED ADMIN — budesonide (PULMICORT) nebulizer solution 0.5 mg: .5 mg | RESPIRATORY_TRACT | @ 12:00:00

## 2022-12-03 MED ADMIN — budesonide (PULMICORT) nebulizer solution 0.5 mg: .5 mg | RESPIRATORY_TRACT | @ 01:00:00

## 2022-12-03 MED ADMIN — potassium chloride ER tablet 40 mEq: 40 meq | ORAL | @ 13:00:00 | Stop: 2022-12-03

## 2022-12-03 MED ADMIN — gabapentin (NEURONTIN) capsule 300 mg: 300 mg | ORAL | @ 01:00:00

## 2022-12-03 MED ADMIN — insulin NPH (HumuLIN,NovoLIN) injection 5 Units: 5 [IU] | SUBCUTANEOUS | @ 20:00:00

## 2022-12-03 MED ADMIN — enoxaparin (LOVENOX) syringe 40 mg: 40 mg | SUBCUTANEOUS | @ 01:00:00

## 2022-12-03 MED ADMIN — furosemide (LASIX) injection 40 mg: 40 mg | INTRAVENOUS | @ 18:00:00

## 2022-12-03 MED ADMIN — sildenafiL (pulm.hypertension) (REVATIO) tablet 20 mg: 20 mg | ORAL | @ 13:00:00 | Stop: 2022-12-03

## 2022-12-03 MED ADMIN — sildenafiL (pulm.hypertension) (REVATIO) tablet 20 mg: 20 mg | ORAL | @ 01:00:00

## 2022-12-03 NOTE — Unmapped (Signed)
MICU Evening Summary     Date of Service: 12/02/2022    Interval History: Karen Barber is a 57 y.o. female with pmhx of stage IV sarcoidosis, bullous disease, bronchiectasis, pulmonary hypertension, chronic hypoxemic respiratory failure who presents to the ICU with acute on chronic hypoxemic respiratory failure.     Assessment & Plan     - Remains on HFNC 45%/45L. Denies significant dyspnea on exam, says that she is overall comfortable. Says that she feels better with the increased diuresis.   - Continues to desaturate with minimal movements, high 80s while talking and moving in bed.   - S/P IV lasix 40 mg x 3 doses today 09/14  - Net neg ~1.5    Princess Bruins, MD  Pulmonary and Critical Care Fellow

## 2022-12-03 NOTE — Unmapped (Signed)
MICU Progress Note     Date of Service: 12/03/2022    Problem List:   Principal Problem:    Acute on chronic hypoxic respiratory failure (CMS-HCC)  Active Problems:    Pulmonary sarcoidosis (CMS-HCC)    Type 2 diabetes mellitus with hyperglycemia (CMS-HCC)    COPD (chronic obstructive pulmonary disease) (CMS-HCC)    Chronic pulmonary aspergillosis (CMS-HCC)    Pulmonary hypertension (CMS-HCC)    AKI (acute kidney injury) (CMS-HCC)      HPI: Karen Barber is a 57 y.o. female with a past medical history of bronchiectasis type 1 in the setting of pulmonary sarcoidosis (6L [place] home O2), HTN, T2DM, COPD, s/p LUL lobectomy, and asthma that initially presented today to the hospital for a PET scan to evaluate her sarcoidosis. To prepare for her appointment, she states that she was told to not take any of her home medicines, including her home sidenafil, lasix, nebulizers, and inhalers.     Upon presentation to the appointment, she was found to have an increased work of breathing and her SPO2 was in the 70s on her home [facility], prompting her to be evaluated in the ED. As her respiratory status did not improve with NRB, she was placed on HFNC. 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     Of note, the patient had a recent admission for pneumonia from 11/05/2022 to 11/12/2022, during which she was followed by the pulm consult team. While the patient was discharged on her baseline O2 need, the patient states that she never really returned to her baseline respiratory status. She endorses having ongoing shortness of breath since discharge and stated that she frequently would increase the flow of her [place] at home.     In the ED, the patient was without a fever or leukocytosis, lactate was non-elevated, electrolytes were within an appropriate range. She denied any recent fevers, chest pain, and no lower extremity leg swelling was appreciated. She received one dose of 125mg  of IV methylprednisolone and then was transferred to the MICU for further management of her hypoxia.     24hr events:   - Plan was to trial on [facility] vs oxymizer yesterday 9/14 but unsuccessful iso continued to desat while on HFNC w/ increasing o2 needs. Evening blood gas showed PaO2 56 on FiO2 of 45% so patient was not trialed on [facility] and continued on HFNC with increase FiO2 45-70%  - late in the afternoon yesterday 9/14 patient with increasing WOB and worsening tachypnea; additional IV lasix 40mg  IV x1 for overall net neg 1.4L (24hrs)-->this morning increased lasix 40mg  IV TID  - no acute events overnight    Neurological   Chronic Pain   - Continuing home gabapentin   - PT/OT consulted, appreciate recs     Analgesia: No pain issues  RASS at goal? N/A, not on sedation  [place] Agitation Assessment Scale (RASS) : 0 (12/03/2022  8:00 AM)       Pulmonary   Acute on chronic hypoxemic respiratory failure - Pulmonary Sarcoidosis - COPD  Patient with history of COPD, pulmonary sarcoidosis, and PAH (on 6L [place] at baseline) that presented to the hospital 9/11 with SPO2 in the 70s on home O2. To assist with her hypoxia, she was placed on HFNC. 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 Of note, patient endorses increasing the flow of her [place] frequently when at home. This is a new change after her recent admission for pneumonia.   - Continue to wean O2 as  able. May need to consider a new O2 setting for home. --> 50% HFNC  - Goal SPO2 > 90%  - Continue home AVAPS at night   - Continue home airway clearance in the setting of bronchiectasis (Aerobika and HTS neb). Of note, during previous admission, also used Pari Proneb Max (see note from Dr. Stasia Cavalier on 8/24). May need to consider.   - Continue home pulmicort, brovana, atrovent and home PRN albuterol   - CXR as above   - Received a one time dose of 125mg  of IV methylpred in the ED  - 9/13 plan to trial on oxymizer this am but patient continues to desat to mid 80's on HFNC so will hold off for now and reassess this afternoon  - Trial oxymizer yesterday 9/13 but unsuccessful iso continued to desat while on HFNC  - FiO2 requirement increased overnight HFNC 50%/50L, continued to desat w/minimal improvement, SpO2 hight 80s while talking and moving in bed. --plan for [facility] today (9/14) as tolerated    FiO2 (%):  [40 %-70 %] 65 %  O2 Device: Heated high flow nasal cannula  O2 Flow Rate (L/min):  [45 L/min-50 L/min] 45 L/min    Cardiovascular   Pulmonary hypertension - RV Failure  Patient with history of PAH. Last echo on 10/23/22 showing LVEF with normal size but increased wall thickness, LVED at > 55%, severe pulmonary HTN, RV severely dilated in size with reduced systolic function, RA mildly dilated.   - repeat Echo pending; plan to repeat on Monday 9/16 after diuresis  - Trend daily BNP   - At home takes lasix 80mg  BID. Plan was to reconsider dose post last admission. Holding home lasix for now and continuing with spot IV diuresis. (Given 1x dose of 40mg  IV on 9/11)  - Continue home sidenafil 40mg  TID  - Strict Is/Os and daily weights   - started lasix 40 IV bid   - She is on her home equivalent of lasix (home lasix 80 mg BID, on IV lasix 40 mg BID) with good diuresis. UOP 1.7L  - 9/13 patient with worsening tachypnea and dyspnea; additional IV lasix 40mg  IV x1 (around midnight) for overall net neg 2.3L (24hrs)   - Plan was to trial on [facility] vs oxymizer yesterday 9/14 but unsuccessful iso continued to desat while on HFNC w/ increasing o2 needs. Evening blood gas showed PaO2 56 on FiO2 of 45% so patient was not trialed on [facility] and continued on HFNC with increase FiO2 45-70%  - late in the afternoon yesterday 9/14 patient with increasing WOB and worsening tachypnea; additional IV lasix 40mg  IV x1 for overall net neg 1.4L (24hrs)-->this morning increased lasix 40mg  IV TID      Renal   AKI on CKD3  Patient with baseline creatinine between 1.2-1.4. Admitted with creatinine at 1.8, likely in the setting of hypoxemia.   - Consider placement of foley catheter for closer I/O monitoring   - Monitoring electrolytes and creatinine with daily BMP   - Renally dose medications, avoid nephrotoxic agents     Infectious Disease/Autoimmune   Potential for Respiratory Infection   Patient without fever, leukocytosis, lactate elevation, however, presented with increased work of breathing, shortness of breath, and hypoxemia. CXR showing extensive pulmonary fibrosis, but unable to clearly evaluate for presence of pneumonia on film. Of note, patient admitted from 8/18-8/25 with Serratia pneumonia. There was concern for potential chronic aspiration. Planned to have outpatient barium swallow to evaluate esophagus.   - Started on  vanc/cefepime for broad coverage --> stopped vanc and cefepime, started levaquin (9/12----  - MRSA screen neg  - RPP neg   - Will send off sputum sample if able   - continued azithromycin (9/12---    Chronic pulmonary aspergillosis   Continue home posa ppx    Cultures:  Blood Culture, Routine (no units)   Date Value   11/05/2022 No Growth at 5 days   11/05/2022 No Growth at 5 days     Lower Respiratory Culture (no units)   Date Value   11/08/2022 3+ Serratia marcescens (A)   11/08/2022 1+ Oropharyngeal Flora Isolated     WBC (10*9/L)   Date Value   12/03/2022 7.2          FEN/GI   Nutrition:   - reg diet    - No indication for GI ppx at this time   - Miralax daily and senna PRN   - barium swallow pending for prior c/f aspiration events-->hold off for now w/barium swallow; patient may follow up outpatient    Provider Malnutrition Assessment:  Body mass index is 25.89 kg/m??.  GLIM criteria:   Pt does not meet criteria  -I have screened this patient for malnutrition and they did NOT meet criteria for malnutrition based on GLIM criteria.  -Nutrition consulted no    Heme/Coag   Chronic Anemia, likely IDA   Iron deficiency anemia is associated with pulmonary hypertension in multiple studies.   - H/H up from last admission, appropriate at this time   - Platelets appropiate   - Continue lovenox ppx (may need to switch to heparin if AKI worsens)   -  iron studies Iron (37) TIBC (254) Iron sat (15)    Endocrine   Type 2 diabetes mellitus with hyperglycemia   HgbA1c 8.3% July 2024, Home regimen of glargine 18 units nightly, sliding scale insulin.  - NPH 15 bid w/ SSI, low threshold to increase--> 9/12 NPH increased to 25U BID  - 9/13 low blood glucose overnight; NPH adjusted this am-->9/14 NPH 16U in am / NPH 10U pm-->9/15 re-adjusted NPH 8U in am/NPH 5U pm    Integumentary   NAI  - WOCN consulted for high risk skin assessment No. Reason: not indicated .  - cont pressure mitigating precautions per skin policy    Prophylaxis/LDA/Restraints/Consults   ICU Checklist completed: yes (see ICU rounding navigator in Epic)      Patient Lines/Drains/Airways Status       Active Active Lines, Drains, & Airways       Name Placement date Placement time Site Days    External Urinary Device 11/06/22 With Suction 11/06/22  0800  -- 27    External Urinary Device 12/02/22 With Suction 12/02/22  1900  -- less than 1    Peripheral IV 11/29/22 Left;Posterior Hand 11/29/22  1947  Hand  3    Peripheral IV 11/30/22 Right Forearm 11/30/22  0300  Forearm  3    Arterial Line 11/29/22 Left Radial 11/29/22  2108  Radial  3                  Patient Lines/Drains/Airways Status       Active Wounds       None                    Goals of Care     Code Status:  Full Code.     Designated Chiropodist Maker:  Ms. Booton current decisional  capacity for healthcare decision-making is Full capacity. Her designated Educational psychologist) is/are   HCDM (patient stated preference): [name] - Media planner - [phone].      Subjective   No complaints reported this morning    Objective     Vitals - past 24 hours  Temp:  [36.3 ??C (97.3 ??F)-36.9 ??C (98.4 ??F)] 36.7 ??C (98 ??F)  Heart Rate:  [76-97] 77  SpO2 Pulse:  [76-97] 76  Resp:  [18-42] 34  FiO2 (%):  [40 %-70 %] 65 %  SpO2:  [87 %-100 %] 97 % Intake/Output  I/O last 3 completed shifts:  In: 545 [P.O.:420; IV Piggyback:125]  Out: 2750 [Urine:2750]     Physical Exam:    General: lying in bed, tachypneic, in NAD  HEENT: head atraumatic, normocephalic. PERRL. Sclera non-icteric.   CV: regular rhythm. No m/g/r. Capillary refill less than 3 seconds in all extremities.   Pulm: breath sounds diminished throughout lung fields bilaterally. Chest expansion symmetrical. No wheezing.   GI: Abdomen is soft/non-tender/non-distended. Active bowel sounds in all quadrants.   MSK: ROM full in all extremities.   Skin: No rashes of lesions noted   Neuro: Alert/ oriented x4 follows commands in all extremities.     The patient's hospital stay has been complicated by the following clinically significant conditions requiring additional evaluation and treatment or having a significant effect of this patient's care: - Anemia POA requiring further investigation or monitoring  - Chronic kidney disease POA requiring further investigation, treatment, or monitoring    Body mass index is 25.89 kg/m??.            Wt Readings from Last 12 Encounters:   11/30/22 84.2 kg (185 lb 10 oz)   11/11/22 90.1 kg (198 lb 10.2 oz)   10/31/22 84.8 kg (187 lb)   10/22/22 84 kg (185 lb 3 oz)   09/25/22 83.9 kg (185 lb)   06/12/22 89.8 kg (198 lb)   05/16/22 89.4 kg (197 lb)   06/29/21 94.8 kg (208 lb 15.9 oz)   04/19/21 94.8 kg (209 lb)   11/02/20 98 kg (216 lb)   07/20/20 97.1 kg (214 lb)   06/08/20 97.1 kg (214 lb)       Continuous Infusions:       Scheduled Medications:    arformoterol  15 mcg Nebulization BID (RT)    azithromycin  250 mg Oral Daily    budesonide  0.5 mg Nebulization BID (RT)    enoxaparin (LOVENOX) injection  40 mg Subcutaneous Nightly    furosemide  40 mg Intravenous TID    gabapentin  300 mg Oral TID    insulin lispro  0-20 Units Subcutaneous ACHS    insulin NPH  5 Units Subcutaneous Q24H    [START ON 12/04/2022] insulin NPH  8 Units Subcutaneous Daily    ipratropium 500 mcg Nebulization BID (RT)    levoFLOXacin  750 mg Oral Every other day    polyethylene glycol  17 g Oral Daily    posaconazole  200 mg Oral Daily    sildenafiL (pulm.hypertension)  20 mg Oral TID    sodium chloride  4 mL Nebulization BID       PRN medications:  acetaminophen, albuterol, calcium carbonate, dextrose in water, glucagon, ondansetron **OR** ondansetron, senna, sodium chloride    Data/Imaging Review: Reviewed in Epic and personally interpreted on 12/03/2022. See EMR for detailed results.       Critical Care Attestation     This  patient is critically ill or injured with the impairment of vital organ systems such that there is a high probability of imminent or life threatening deterioration in the patient's condition. This patient must remain in the ICU for ongoing evaluation of the comprehensive management plan outlined in this note. I directly provided critical care services as documented in this note and the critical care time spent (35 min) is exclusive of separately billable procedures.  In addition to time spent for critical care management, I also provided advance care planning services for  0  minutes (see GOC above or ACP note for details)???. Total billable critical care time 35 minutes.    Arlyn Dunning, AGNP

## 2022-12-03 NOTE — Unmapped (Signed)
Patient continues on HFNC with varying FIO2 depending on her activity. Sildenafil dose increased today and effect on O2 improvement will be assessed. She is afebrile. Blood glucoses are now stable with no further episodes of hypoglycemia this shift. VSS. UOP good s/p lasix. Will monitor.             Problem: Adult Inpatient Plan of Care  Goal: Plan of Care Review  Outcome: Ongoing - Unchanged  Goal: Patient-Specific Goal (Individualized)  Outcome: Ongoing - Unchanged  Goal: Absence of Hospital-Acquired Illness or Injury  Outcome: Ongoing - Unchanged  Intervention: Prevent Skin Injury  Recent Flowsheet Documentation  Taken 12/03/2022 0800 by Adolphus Birchwood, RN  Device Skin Pressure Protection:   absorbent pad utilized/changed   positioning supports utilized  Skin Protection:   incontinence pads utilized   pulse oximeter probe site changed  Intervention: Prevent and Manage VTE (Venous Thromboembolism) Risk  Recent Flowsheet Documentation  Taken 12/03/2022 0800 by Adolphus Birchwood, RN  VTE Prevention/Management: bleeding precautions maintained  Intervention: Prevent Infection  Recent Flowsheet Documentation  Taken 12/03/2022 0800 by Adolphus Birchwood, RN  Infection Prevention: hand hygiene promoted  Goal: Optimal Comfort and Wellbeing  Outcome: Ongoing - Unchanged  Goal: Readiness for Transition of Care  Outcome: Ongoing - Unchanged  Goal: Rounds/Family Conference  Outcome: Ongoing - Unchanged     Problem: Gas Exchange Impaired  Goal: Optimal Gas Exchange  Outcome: Ongoing - Unchanged  Intervention: Optimize Oxygenation and Ventilation  Recent Flowsheet Documentation  Taken 12/03/2022 0800 by Adolphus Birchwood, RN  Head of Bed [facility]) Positioning: HOB at 30 degrees     Problem: Fall Injury Risk  Goal: Absence of Fall and Fall-Related Injury  Outcome: Ongoing - Unchanged

## 2022-12-03 NOTE — Unmapped (Signed)
Patient awake/alert/oriented x4 overnight. Desats frequently with any amount of talking or activity. Increased FiO2 required overnight to maintain goal spO2 88-92%. Pressures vary as patient wakes/sleeps, but MAP sustains >65.  Ate ~50% of dinner. BG stable per morning ABG.  Adequate UOP overnight.   No significant events this shift.        Problem: Adult Inpatient Plan of Care  Goal: Plan of Care Review  Outcome: Ongoing - Unchanged  Goal: Patient-Specific Goal (Individualized)  Outcome: Ongoing - Unchanged  Goal: Absence of Hospital-Acquired Illness or Injury  Outcome: Ongoing - Unchanged  Intervention: Identify and Manage Fall Risk  Recent Flowsheet Documentation  Taken 12/02/2022 2000 by Marguerite Olea, RN  Safety Interventions:   aspiration precautions   bleeding precautions   commode/urinal/bedpan at bedside   fall reduction program maintained   lighting adjusted for tasks/safety   low bed   nonskid shoes/slippers when out of bed  Intervention: Prevent Skin Injury  Recent Flowsheet Documentation  Taken 12/02/2022 2200 by Marguerite Olea, RN  Positioning for Skin: (sitting up in bed) --  Taken 12/02/2022 2000 by Marguerite Olea, RN  Positioning for Skin: (sitting up in bed) --  Device Skin Pressure Protection: absorbent pad utilized/changed  Skin Protection: adhesive use limited  Intervention: Prevent and Manage VTE (Venous Thromboembolism) Risk  Recent Flowsheet Documentation  Taken 12/02/2022 2000 by Marguerite Olea, RN  VTE Prevention/Management: anticoagulant therapy  Goal: Optimal Comfort and Wellbeing  Outcome: Ongoing - Unchanged  Goal: Readiness for Transition of Care  Outcome: Ongoing - Unchanged  Goal: Rounds/Family Conference  Outcome: Ongoing - Unchanged     Problem: Fall Injury Risk  Goal: Absence of Fall and Fall-Related Injury  Outcome: Ongoing - Unchanged  Intervention: Identify and Manage Contributors  Recent Flowsheet Documentation  Taken 12/02/2022 2000 by Marguerite Olea, RN  Self-Care Promotion: independence encouraged  Intervention: Promote Injury-Free Environment  Recent Flowsheet Documentation  Taken 12/02/2022 2000 by Marguerite Olea, RN  Safety Interventions:   aspiration precautions   bleeding precautions   commode/urinal/bedpan at bedside   fall reduction program maintained   lighting adjusted for tasks/safety   low bed   nonskid shoes/slippers when out of bed     Problem: Gas Exchange Impaired  Goal: Optimal Gas Exchange  Outcome: Ongoing - Unchanged  Intervention: Optimize Oxygenation and Ventilation  Recent Flowsheet Documentation  Taken 12/03/2022 0400 by Marguerite Olea, RN  Head of Bed [facility]) Positioning: HOB at 60-90 degrees  Taken 12/03/2022 0200 by Marguerite Olea, RN  Head of Bed [facility]) Positioning: HOB at 60-90 degrees  Taken 12/03/2022 0000 by Marguerite Olea, RN  Head of Bed [facility]) Positioning: HOB at 60-90 degrees  Taken 12/02/2022 2200 by Marguerite Olea, RN  Head of Bed [facility]) Positioning: HOB at 60-90 degrees  Taken 12/02/2022 2000 by Marguerite Olea, RN  Head of Bed [facility]) Positioning: HOB at 60-90 degrees

## 2022-12-03 NOTE — Unmapped (Signed)
Pt remains on HFNC and unable to wean oxygen.  Pt asked for a nebulizer at 1700.  An albuterol UD was given via aerogen/high flow.  Pt was also being given some lasix.  Pt in no respiratory distress

## 2022-12-03 NOTE — Unmapped (Addendum)
Patient remained on HFNC throughout the shift.  FiO2 increased to 70% this morning due to PO2 of 48.  Of note, patient was desaturating to 77-82% while ABG was being drawn.  Patient continues to desaturate with movement but does recover. She also continues to be tachypneic but denies shortness of breath when asked, stating her breathing is ok.

## 2022-12-04 LAB — BLOOD GAS CRITICAL CARE PANEL, ARTERIAL
BASE EXCESS ARTERIAL: 8.3 — ABNORMAL HIGH (ref -2.0–2.0)
BASE EXCESS ARTERIAL: 5.3 — ABNORMAL HIGH (ref -2.0–2.0)
BASE EXCESS ARTERIAL: 9.9 — ABNORMAL HIGH (ref -2.0–2.0)
CALCIUM IONIZED ARTERIAL (MG/DL): 5.22 mg/dL (ref 4.40–5.40)
CALCIUM IONIZED ARTERIAL (MG/DL): 4.7 mg/dL (ref 4.40–5.40)
CALCIUM IONIZED ARTERIAL (MG/DL): 4.57 mg/dL (ref 4.40–5.40)
GLUCOSE WHOLE BLOOD: 136 mg/dL (ref 70–179)
GLUCOSE WHOLE BLOOD: 272 mg/dL — ABNORMAL HIGH (ref 70–179)
GLUCOSE WHOLE BLOOD: 211 mg/dL — ABNORMAL HIGH (ref 70–179)
HCO3 ARTERIAL: 34 mmol/L — ABNORMAL HIGH (ref 22–27)
HCO3 ARTERIAL: 30 mmol/L — ABNORMAL HIGH (ref 22–27)
HCO3 ARTERIAL: 36 mmol/L — ABNORMAL HIGH (ref 22–27)
HEMOGLOBIN BLOOD GAS: 10.9 g/dL — ABNORMAL LOW
HEMOGLOBIN BLOOD GAS: 11.4 g/dL — ABNORMAL LOW
HEMOGLOBIN BLOOD GAS: 11.2 g/dL — ABNORMAL LOW (ref 12.00–16.00)
LACTATE BLOOD ARTERIAL: 1 mmol/L (ref <1.3)
LACTATE BLOOD ARTERIAL: 3.4 mmol/L — ABNORMAL HIGH (ref <1.3)
LACTATE BLOOD ARTERIAL: 1.2 mmol/L (ref <1.3)
O2 SATURATION ARTERIAL: 98.4 % (ref 94.0–100.0)
O2 SATURATION ARTERIAL: 90.6 % — ABNORMAL LOW (ref 94.0–100.0)
O2 SATURATION ARTERIAL: 93.6 % — ABNORMAL LOW (ref 94.0–100.0)
PCO2 ARTERIAL: 58.6 mmHg — ABNORMAL HIGH (ref 35.0–45.0)
PCO2 ARTERIAL: 48.6 mmHg — ABNORMAL HIGH (ref 35.0–45.0)
PCO2 ARTERIAL: 53.3 mmHg — ABNORMAL HIGH (ref 35.0–45.0)
PH ARTERIAL: 7.38 (ref 7.35–7.45)
PH ARTERIAL: 7.41 (ref 7.35–7.45)
PH ARTERIAL: 7.43 (ref 7.35–7.45)
PO2 ARTERIAL: 119 mmHg — ABNORMAL HIGH (ref 80.0–110.0)
PO2 ARTERIAL: 55.9 mmHg — ABNORMAL LOW (ref 80.0–110.0)
PO2 ARTERIAL: 67.3 mmHg — ABNORMAL LOW (ref 80.0–110.0)
POTASSIUM WHOLE BLOOD: 4.2 mmol/L (ref 3.4–4.6)
POTASSIUM WHOLE BLOOD: 4 mmol/L (ref 3.4–4.6)
POTASSIUM WHOLE BLOOD: 4 mmol/L (ref 3.4–4.6)
SODIUM WHOLE BLOOD: 148 mmol/L — ABNORMAL HIGH (ref 135–145)
SODIUM WHOLE BLOOD: 141 mmol/L (ref 135–145)
SODIUM WHOLE BLOOD: 141 mmol/L (ref 135–145)

## 2022-12-04 LAB — COMPREHENSIVE METABOLIC PANEL
ALBUMIN: 2.8 g/dL — ABNORMAL LOW (ref 3.4–5.0)
ALKALINE PHOSPHATASE: 63 U/L (ref 46–116)
ALT (SGPT): 8 U/L — ABNORMAL LOW (ref 10–49)
ANION GAP: 3 mmol/L — ABNORMAL LOW (ref 5–14)
AST (SGOT): 8 U/L (ref <=34)
BILIRUBIN TOTAL: 0.4 mg/dL (ref 0.3–1.2)
BLOOD UREA NITROGEN: 47 mg/dL — ABNORMAL HIGH (ref 9–23)
BUN / CREAT RATIO: 33
CALCIUM: 9 mg/dL (ref 8.7–10.4)
CHLORIDE: 104 mmol/L (ref 98–107)
CO2: 36 mmol/L — ABNORMAL HIGH (ref 20.0–31.0)
CREATININE: 1.44 mg/dL — ABNORMAL HIGH
EGFR CKD-EPI (2021) FEMALE: 42 mL/min/{1.73_m2} — ABNORMAL LOW (ref >=60)
GLUCOSE RANDOM: 132 mg/dL (ref 70–179)
POTASSIUM: 4.2 mmol/L (ref 3.4–4.8)
PROTEIN TOTAL: 6.1 g/dL (ref 5.7–8.2)
SODIUM: 143 mmol/L (ref 135–145)

## 2022-12-04 LAB — CBC W/ AUTO DIFF
BASOPHILS ABSOLUTE COUNT: 0.1 10*9/L (ref 0.0–0.1)
BASOPHILS RELATIVE PERCENT: 0.9 %
EOSINOPHILS ABSOLUTE COUNT: 0.8 10*9/L — ABNORMAL HIGH (ref 0.0–0.5)
EOSINOPHILS RELATIVE PERCENT: 11.4 %
HEMATOCRIT: 33.9 % — ABNORMAL LOW (ref 34.0–44.0)
HEMOGLOBIN: 10.8 g/dL — ABNORMAL LOW (ref 11.3–14.9)
LYMPHOCYTES ABSOLUTE COUNT: 1.4 10*9/L (ref 1.1–3.6)
LYMPHOCYTES RELATIVE PERCENT: 19.9 %
MEAN CORPUSCULAR HEMOGLOBIN CONC: 31.8 g/dL — ABNORMAL LOW (ref 32.0–36.0)
MEAN CORPUSCULAR HEMOGLOBIN: 26.1 pg (ref 25.9–32.4)
MEAN CORPUSCULAR VOLUME: 82.1 fL (ref 77.6–95.7)
MEAN PLATELET VOLUME: 8.1 fL (ref 6.8–10.7)
MONOCYTES ABSOLUTE COUNT: 0.9 10*9/L — ABNORMAL HIGH (ref 0.3–0.8)
MONOCYTES RELATIVE PERCENT: 12.8 %
NEUTROPHILS ABSOLUTE COUNT: 3.9 10*9/L (ref 1.8–7.8)
NEUTROPHILS RELATIVE PERCENT: 55 %
PLATELET COUNT: 247 10*9/L (ref 150–450)
RED BLOOD CELL COUNT: 4.12 10*12/L (ref 3.95–5.13)
RED CELL DISTRIBUTION WIDTH: 16.5 % — ABNORMAL HIGH (ref 12.2–15.2)
WBC ADJUSTED: 7.1 10*9/L (ref 3.6–11.2)

## 2022-12-04 LAB — BASIC METABOLIC PANEL
ANION GAP: 2 mmol/L — ABNORMAL LOW (ref 5–14)
BLOOD UREA NITROGEN: 41 mg/dL — ABNORMAL HIGH (ref 9–23)
BUN / CREAT RATIO: 29
CALCIUM: 9.4 mg/dL (ref 8.7–10.4)
CHLORIDE: 100 mmol/L (ref 98–107)
CO2: 34 mmol/L — ABNORMAL HIGH (ref 20.0–31.0)
CREATININE: 1.42 mg/dL — ABNORMAL HIGH
EGFR CKD-EPI (2021) FEMALE: 43 mL/min/{1.73_m2} — ABNORMAL LOW (ref >=60)
GLUCOSE RANDOM: 206 mg/dL — ABNORMAL HIGH (ref 70–179)
POTASSIUM: 3.9 mmol/L (ref 3.4–4.8)
SODIUM: 136 mmol/L (ref 135–145)

## 2022-12-04 LAB — MAGNESIUM: MAGNESIUM: 1.7 mg/dL (ref 1.6–2.6)

## 2022-12-04 LAB — PRO-BNP: PRO-BNP: 3863 pg/mL — ABNORMAL HIGH (ref <=300.0)

## 2022-12-04 LAB — PHOSPHORUS: PHOSPHORUS: 4.1 mg/dL (ref 2.4–5.1)

## 2022-12-04 MED ADMIN — sildenafiL (pulm.hypertension) (REVATIO) tablet 40 mg: 40 mg | ORAL | @ 18:00:00

## 2022-12-04 MED ADMIN — azithromycin (ZITHROMAX) tablet 250 mg: 250 mg | ORAL | @ 13:00:00 | Stop: 2025-08-25

## 2022-12-04 MED ADMIN — sodium chloride 3 % NEBULIZER solution 4 mL: 4 mL | RESPIRATORY_TRACT | @ 13:00:00

## 2022-12-04 MED ADMIN — insulin lispro (HumaLOG) injection 0-20 Units: 0-20 [IU] | SUBCUTANEOUS | @ 16:00:00

## 2022-12-04 MED ADMIN — insulin NPH (HumuLIN,NovoLIN) injection 5 Units: 5 [IU] | SUBCUTANEOUS | @ 21:00:00

## 2022-12-04 MED ADMIN — sildenafiL (pulm.hypertension) (REVATIO) tablet 40 mg: 40 mg | ORAL

## 2022-12-04 MED ADMIN — furosemide (LASIX) injection 40 mg: 40 mg | INTRAVENOUS | @ 18:00:00 | Stop: 2022-12-04

## 2022-12-04 MED ADMIN — furosemide (LASIX) injection 40 mg: 40 mg | INTRAVENOUS | @ 13:00:00 | Stop: 2022-12-04

## 2022-12-04 MED ADMIN — ipratropium (ATROVENT) 0.02 % nebulizer solution 500 mcg: 500 ug | RESPIRATORY_TRACT | @ 13:00:00

## 2022-12-04 MED ADMIN — sodium chloride 3 % NEBULIZER solution 4 mL: 4 mL | RESPIRATORY_TRACT | @ 02:00:00

## 2022-12-04 MED ADMIN — arformoterol (BROVANA) nebulizer solution 15 mcg/2 mL: 15 ug | RESPIRATORY_TRACT | @ 02:00:00

## 2022-12-04 MED ADMIN — posaconazole (NOXAFIL) delayed released tablet 200 mg: 200 mg | ORAL | @ 13:00:00

## 2022-12-04 MED ADMIN — magnesium oxide-Mg AA chelate (Magnesium Plus Protein) 2 tablet: 2 | ORAL | @ 13:00:00

## 2022-12-04 MED ADMIN — sildenafiL (pulm.hypertension) (REVATIO) tablet 40 mg: 40 mg | ORAL | @ 13:00:00

## 2022-12-04 MED ADMIN — enoxaparin (LOVENOX) syringe 40 mg: 40 mg | SUBCUTANEOUS

## 2022-12-04 MED ADMIN — gabapentin (NEURONTIN) capsule 300 mg: 300 mg | ORAL | @ 18:00:00

## 2022-12-04 MED ADMIN — insulin NPH (HumuLIN,NovoLIN) injection 8 Units: 8 [IU] | SUBCUTANEOUS | @ 13:00:00

## 2022-12-04 MED ADMIN — ipratropium (ATROVENT) 0.02 % nebulizer solution 500 mcg: 500 ug | RESPIRATORY_TRACT | @ 02:00:00

## 2022-12-04 MED ADMIN — budesonide (PULMICORT) nebulizer solution 0.5 mg: .5 mg | RESPIRATORY_TRACT | @ 13:00:00

## 2022-12-04 MED ADMIN — budesonide (PULMICORT) nebulizer solution 0.5 mg: .5 mg | RESPIRATORY_TRACT | @ 02:00:00

## 2022-12-04 MED ADMIN — arformoterol (BROVANA) nebulizer solution 15 mcg/2 mL: 15 ug | RESPIRATORY_TRACT | @ 13:00:00

## 2022-12-04 MED ADMIN — furosemide (LASIX) injection 40 mg: 40 mg | INTRAVENOUS

## 2022-12-04 MED ADMIN — gabapentin (NEURONTIN) capsule 300 mg: 300 mg | ORAL

## 2022-12-04 MED ADMIN — gabapentin (NEURONTIN) capsule 300 mg: 300 mg | ORAL | @ 13:00:00

## 2022-12-04 NOTE — Unmapped (Addendum)
Pt found after returning self to bed from the commode and found more lethargic.  Complained of SOB and placed on nonrebreather. Per Aline her MAPs started to drop to 40's and pt started to become more unresponsive. By the time the APP arrived in room, pts mental status improved and MAPS increased.  Pt remained stable remainder of the night. VSS. A&Ox4.  See MAR and flowsheet for more.     Problem: Adult Inpatient Plan of Care  Goal: Absence of Hospital-Acquired Illness or Injury  Intervention: Identify and Manage Fall Risk  Recent Flowsheet Documentation  Taken 12/03/2022 2000 by Donnal Debar, RN  Safety Interventions:   aspiration precautions   bleeding precautions   environmental modification   infection management   fall reduction program maintained   low bed  Intervention: Prevent Skin Injury  Recent Flowsheet Documentation  Taken 12/04/2022 0400 by Donnal Debar, RN  Positioning for Skin: Supine/Back  Taken 12/04/2022 0200 by Donnal Debar, RN  Positioning for Skin: Supine/Back  Taken 12/04/2022 0000 by Donnal Debar, RN  Positioning for Skin: Supine/Back  Taken 12/03/2022 2200 by Donnal Debar, RN  Positioning for Skin: Supine/Back  Taken 12/03/2022 2000 by Donnal Debar, RN  Positioning for Skin: Supine/Back  Device Skin Pressure Protection:   absorbent pad utilized/changed   positioning supports utilized  Skin Protection:   incontinence pads utilized   pulse oximeter probe site changed  Intervention: Prevent and Manage VTE (Venous Thromboembolism) Risk  Recent Flowsheet Documentation  Taken 12/03/2022 2000 by Donnal Debar, RN  VTE Prevention/Management: bleeding precautions maintained  Intervention: Prevent Infection  Recent Flowsheet Documentation  Taken 12/03/2022 2000 by Donnal Debar, RN  Infection Prevention:   cohorting utilized   environmental surveillance performed   equipment surfaces disinfected   hand hygiene promoted   personal protective equipment utilized     Problem: Fall Injury Risk  Goal: Absence of Fall and Fall-Related Injury  Intervention: Promote Injury-Free Environment  Recent Flowsheet Documentation  Taken 12/03/2022 2000 by Donnal Debar, RN  Safety Interventions:   aspiration precautions   bleeding precautions   environmental modification   infection management   fall reduction program maintained   low bed     Problem: Gas Exchange Impaired  Goal: Optimal Gas Exchange  Intervention: Optimize Oxygenation and Ventilation  Recent Flowsheet Documentation  Taken 12/04/2022 0400 by Donnal Debar, RN  Head of Bed [facility]) Positioning: HOB at 30-45 degrees  Taken 12/03/2022 2000 by Donnal Debar, RN  Head of Bed [facility]) Positioning: HOB at 30-45 degrees

## 2022-12-04 NOTE — Unmapped (Signed)
7a shift summary:  Patient able to turn and reposition self. Sat up in chair. Patient remained free from falls/injury. Monitoring VS, remained afebrile. Bed low and locked. No complaints expressed at this time, will continue to monitor.      Problem: Adult Inpatient Plan of Care  Goal: Plan of Care Review  Outcome: Ongoing - Unchanged  Goal: Patient-Specific Goal (Individualized)  Outcome: Ongoing - Unchanged  Goal: Absence of Hospital-Acquired Illness or Injury  Outcome: Ongoing - Unchanged  Intervention: Identify and Manage Fall Risk  Recent Flowsheet Documentation  Taken 12/04/2022 0800 by Geanie Berlin, RN  Safety Interventions:   aspiration precautions   fall reduction program maintained   isolation precautions   lighting adjusted for tasks/safety   low bed   nonskid shoes/slippers when out of bed  Intervention: Prevent Skin Injury  Recent Flowsheet Documentation  Taken 12/04/2022 0800 by Geanie Berlin, RN  Positioning for Skin: Supine/Back  Intervention: Prevent and Manage VTE (Venous Thromboembolism) Risk  Recent Flowsheet Documentation  Taken 12/04/2022 1600 by Geanie Berlin, RN  Anti-Embolism Device Type: SCD, Knee  Anti-Embolism Intervention: Refused  Anti-Embolism Device Location: BLE  Taken 12/04/2022 1400 by Geanie Berlin, RN  Anti-Embolism Device Type: SCD, Knee  Anti-Embolism Intervention: Refused  Anti-Embolism Device Location: BLE  Taken 12/04/2022 1200 by Geanie Berlin, RN  Anti-Embolism Device Type: SCD, Knee  Anti-Embolism Intervention: Refused  Anti-Embolism Device Location: BLE  Taken 12/04/2022 1000 by Geanie Berlin, RN  Anti-Embolism Device Type: SCD, Knee  Anti-Embolism Intervention: Refused  Anti-Embolism Device Location: BLE  Taken 12/04/2022 0800 by Geanie Berlin, RN  Anti-Embolism Device Type: SCD, Knee  Anti-Embolism Intervention: Refused  Anti-Embolism Device Location: BLE  Goal: Optimal Comfort and Wellbeing  Outcome: Ongoing - Unchanged  Goal: Readiness for Transition of Care  Outcome: Ongoing - Unchanged  Goal: Rounds/Family Conference  Outcome: Ongoing - Unchanged     Problem: Fall Injury Risk  Goal: Absence of Fall and Fall-Related Injury  Outcome: Ongoing - Unchanged  Intervention: Promote Scientist, clinical (histocompatibility and immunogenetics) Documentation  Taken 12/04/2022 0800 by Geanie Berlin, RN  Safety Interventions:   aspiration precautions   fall reduction program maintained   isolation precautions   lighting adjusted for tasks/safety   low bed   nonskid shoes/slippers when out of bed     Problem: Gas Exchange Impaired  Goal: Optimal Gas Exchange  Outcome: Ongoing - Unchanged  Intervention: Optimize Oxygenation and Ventilation  Recent Flowsheet Documentation  Taken 12/04/2022 1200 by Geanie Berlin, RN  Head of Bed [facility]) Positioning: HOB at 30-45 degrees  Taken 12/04/2022 0800 by Geanie Berlin, RN  Head of Bed [facility]) Positioning: HOB at 30-45 degrees

## 2022-12-04 NOTE — Unmapped (Signed)
MICU Progress Note     Date of Service: 12/04/2022    Problem List:   Principal Problem:    Acute on chronic hypoxic respiratory failure (CMS-HCC)  Active Problems:    Pulmonary sarcoidosis (CMS-HCC)    Type 2 diabetes mellitus with hyperglycemia (CMS-HCC)    COPD (chronic obstructive pulmonary disease) (CMS-HCC)    Chronic pulmonary aspergillosis (CMS-HCC)    Pulmonary hypertension (CMS-HCC)    AKI (acute kidney injury) (CMS-HCC)      HPI: Karen Barber is a 57 y.o. female with a past medical history of bronchiectasis type 1 in the setting of pulmonary sarcoidosis (6L [place] home O2), HTN, T2DM, COPD, s/p LUL lobectomy, and asthma that initially presented today to the hospital for a PET scan to evaluate her sarcoidosis. To prepare for her appointment, she states that she was told to not take any of her home medicines, including her home sidenafil, lasix, nebulizers, and inhalers.     Upon presentation to the appointment, she was found to have an increased work of breathing and her SPO2 was in the 70s on her home [facility], prompting her to be evaluated in the ED. As her respiratory status did not improve with NRB, she was placed on HFNC. 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     Of note, the patient had a recent admission for pneumonia from 11/05/2022 to 11/12/2022, during which she was followed by the pulm consult team. While the patient was discharged on her baseline O2 need, the patient states that she never really returned to her baseline respiratory status. She endorses having ongoing shortness of breath since discharge and stated that she frequently would increase the flow of her [place] at home.     In the ED, the patient was without a fever or leukocytosis, lactate was non-elevated, electrolytes were within an appropriate range. She denied any recent fevers, chest pain, and no lower extremity leg swelling was appreciated. She received one dose of 125mg  of IV methylprednisolone and then was transferred to the MICU for further management of her hypoxia.     24hr events:   - Brief 5 minute episode of hypotension overnight. Noted to have MAPs in the 40s- noted to be mildly dizzy during episode. MAPs increased back to 70s post episode.   - 3x 40mg  IV lasix yesterday, net negative 2.1L  - Weaning O2 as able, plan to trial [facility] today   - levaquin stopped today as patient had completed 5d course of abx     Neurological   Chronic Pain   - Continuing home gabapentin   - PT/OT consulted, appreciate recs     Analgesia: No pain issues  RASS at goal? N/A, not on sedation  [place] Agitation Assessment Scale (RASS) : 0 (12/04/2022  8:00 AM)       Pulmonary   Acute on chronic hypoxemic respiratory failure - Pulmonary Sarcoidosis - COPD  Patient with history of COPD, pulmonary sarcoidosis, and PAH (on 6L [place] at baseline) that presented to the hospital 9/11 with SPO2 in the 70s on home O2. To assist with her hypoxia, she was placed on HFNC. 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 Of note, patient endorses increasing the flow of her [place] frequently when at home. This is a new change after her recent admission for pneumonia.   - Continue to wean O2 as able. May need to consider a new O2 setting for home.   - Currently on HFNC, plan to trial [facility] today  as able   - Goal SPO2 > 90%  - Continue home AVAPS at night   - Continue home airway clearance in the setting of bronchiectasis (Aerobika and HTS neb). Of note, during previous admission, also used Pari Proneb Max (see note from Dr. Stasia Cavalier on 8/24). May need to consider.   - Continue home pulmicort, brovana, atrovent and home PRN albuterol   - CXR as above   - Received a one time dose of 125mg  of IV methylpred in the ED    FiO2 (%):  [50 %-65 %] 50 %  O2 Device: Heated high flow nasal cannula  O2 Flow Rate (L/min):  [45 L/min] 45 L/min    Cardiovascular   Pulmonary hypertension - RV Failure  Patient with history of PAH. Last echo on 10/23/22 showing LVEF with normal size but increased wall thickness, LVED at > 55%, severe pulmonary HTN, RV severely dilated in size with reduced systolic function, RA mildly dilated.   - repeat Echo pending; plan to repeat today   - Trend daily BNP   - At home takes lasix 80mg  BID. Currently on 40mg  IV lasix TID. Diuresing well (initially on 40mg  IV BID, but needed an additional dose for optimal output).   - Continue home sidenafil 40mg  TID  - Strict Is/Os and daily weights     Renal   AKI on CKD3  Patient with baseline creatinine between 1.2-1.4. Admitted with creatinine at 1.8, likely in the setting of hypoxemia.   - Consider placement of foley catheter for closer I/O monitoring   - Monitoring electrolytes and creatinine with daily BMP   - Renally dose medications, avoid nephrotoxic agents     Infectious Disease/Autoimmune   Potential for Respiratory Infection   Patient without fever, leukocytosis, lactate elevation, however, presented with increased work of breathing, shortness of breath, and hypoxemia. CXR showing extensive pulmonary fibrosis, but unable to clearly evaluate for presence of pneumonia on film. Of note, patient admitted from 8/18-8/25 with Serratia pneumonia. There was concern for potential chronic aspiration. Planned to have outpatient barium swallow to evaluate esophagus.   - Started on vanc/cefepime for broad coverage --> stopped vanc and cefepime 9/12- then levaquin (9/12---9/16)  - MRSA screen neg  - RPP neg   - Will send off sputum sample if able   - continued azithromycin (9/12---    Chronic pulmonary aspergillosis   Continue home posa ppx    Cultures:  Blood Culture, Routine (no units)   Date Value   11/05/2022 No Growth at 5 days   11/05/2022 No Growth at 5 days     Lower Respiratory Culture (no units)   Date Value   11/08/2022 3+ Serratia marcescens (A)   11/08/2022 1+ Oropharyngeal Flora Isolated     WBC (10*9/L)   Date Value   12/04/2022 7.1          FEN/GI   Nutrition:   - reg diet    - No indication for GI ppx at this time   - Miralax daily and senna PRN   - barium swallow pending for prior c/f aspiration events-->hold off for now w/barium swallow; patient may follow up outpatient    Provider Malnutrition Assessment:  Body mass index is 25.89 kg/m??.  GLIM criteria:   Pt does not meet criteria  -I have screened this patient for malnutrition and they did NOT meet criteria for malnutrition based on GLIM criteria.  -Nutrition consulted no    Heme/Coag   Chronic Anemia, likely IDA  Iron deficiency anemia is associated with pulmonary hypertension in multiple studies.   - H/H up from last admission, appropriate at this time   - Platelets appropiate   - Continue lovenox ppx (may need to switch to heparin if AKI worsens)   -  iron studies Iron (37) TIBC (254) Iron sat (15)    Endocrine   Type 2 diabetes mellitus with hyperglycemia   HgbA1c 8.3% July 2024, Home regimen of glargine 18 units nightly, sliding scale insulin.  - NPH 15 bid w/ SSI 9/11-> 9/12 NPH increased to 25U BID  - 9/13 low blood glucose overnight; NPH adjusted -->9/14 NPH 16U in am / NPH 10U pm-->9/15 re-adjusted NPH 8U in am/NPH 5U pm    Integumentary   NAI  - WOCN consulted for high risk skin assessment No. Reason: not indicated .  - cont pressure mitigating precautions per skin policy    Prophylaxis/LDA/Restraints/Consults   ICU Checklist completed: yes (see ICU rounding navigator in Epic)      Patient Lines/Drains/Airways Status       Active Active Lines, Drains, & Airways       Name Placement date Placement time Site Days    External Urinary Device 12/02/22 With Suction 12/02/22  1900  -- 1    Peripheral IV 11/29/22 Left;Posterior Hand 11/29/22  1947  Hand  4    Peripheral IV 11/30/22 Right Forearm 11/30/22  0300  Forearm  4    Arterial Line 11/29/22 Left Radial 11/29/22  2108  Radial  4                  Patient Lines/Drains/Airways Status       Active Wounds       None                    Goals of Care     Code Status:  Full Code.     Designated Chiropodist Maker:  Ms. Shevchuk current decisional capacity for healthcare decision-making is Full capacity. Her designated Educational psychologist) is/are   HCDM (patient stated preference): [name] - Media planner - [phone].      Subjective   States that she is feeling better today     Objective     Vitals - past 24 hours  Temp:  [36.1 ??C (97 ??F)-36.7 ??C (98.1 ??F)] 36.3 ??C (97.4 ??F)  Heart Rate:  [76-98] 81  SpO2 Pulse:  [74-100] 81  Resp:  [16-43] 16  BP: (77-105)/(57-77) 102/69  FiO2 (%):  [50 %-65 %] 50 %  SpO2:  [82 %-100 %] 93 % Intake/Output  I/O last 3 completed shifts:  In: 430 [P.O.:430]  Out: 2950 [Urine:2950]     Physical Exam:    General: sitting upright in bed, tachypneic, in NAD  HEENT: head atraumatic, normocephalic. PERRL. Sclera non-icteric.   CV: regular rhythm. No m/g/r. Capillary refill less than 3 seconds in all extremities.   Pulm: breath sounds diminished throughout lung fields bilaterally with intermittent expiratory wheezes bilaterally. Chest expansion symmetrical.   GI: Abdomen is soft/non-tender/non-distended. Active bowel sounds in all quadrants.   MSK: ROM full in all extremities.   Skin: No rashes of lesions noted   Neuro: Alert/ oriented x4 follows commands in all extremities.     The patient's hospital stay has been complicated by the following clinically significant conditions requiring additional evaluation and treatment or having a significant effect of this patient's care: - Anemia POA requiring further investigation or monitoring  -  Chronic kidney disease POA requiring further investigation, treatment, or monitoring    Body mass index is 25.89 kg/m??.            Wt Readings from Last 12 Encounters:   11/30/22 84.2 kg (185 lb 10 oz)   11/11/22 90.1 kg (198 lb 10.2 oz)   10/31/22 84.8 kg (187 lb)   10/22/22 84 kg (185 lb 3 oz)   09/25/22 83.9 kg (185 lb)   06/12/22 89.8 kg (198 lb)   05/16/22 89.4 kg (197 lb)   06/29/21 94.8 kg (208 lb 15.9 oz)   04/19/21 94.8 kg (209 lb)   11/02/20 98 kg (216 lb)   07/20/20 97.1 kg (214 lb)   06/08/20 97.1 kg (214 lb)       Continuous Infusions:       Scheduled Medications:    arformoterol  15 mcg Nebulization BID (RT)    azithromycin  250 mg Oral Daily    budesonide  0.5 mg Nebulization BID (RT)    enoxaparin (LOVENOX) injection  40 mg Subcutaneous Nightly    furosemide  40 mg Intravenous TID    gabapentin  300 mg Oral TID    insulin lispro  0-20 Units Subcutaneous ACHS    insulin NPH  5 Units Subcutaneous Q24H    insulin NPH  8 Units Subcutaneous Daily    ipratropium  500 mcg Nebulization BID (RT)    magnesium oxide-Mg AA chelate  2 tablet Oral BID    polyethylene glycol  17 g Oral Daily    posaconazole  200 mg Oral Daily    sildenafiL (pulm.hypertension)  40 mg Oral TID    sodium chloride  4 mL Nebulization BID (RT)       PRN medications:  acetaminophen, albuterol, calcium carbonate, dextrose in water, glucagon, ondansetron **OR** ondansetron, senna, sodium chloride    Data/Imaging Review: Reviewed in Epic and personally interpreted on 12/04/2022. See EMR for detailed results.       Critical Care Attestation     This patient is critically ill or injured with the impairment of vital organ systems such that there is a high probability of imminent or life threatening deterioration in the patient's condition. This patient must remain in the ICU for ongoing evaluation of the comprehensive management plan outlined in this note. I directly provided critical care services as documented in this note and the critical care time spent (45 min) is exclusive of separately billable procedures.  In addition to time spent for critical care management, I also provided advance care planning services for  0  minutes (see GOC above or ACP note for details)???. Total billable critical care time 45 minutes.    Esly Selvage Orion Modest, AGNP

## 2022-12-04 NOTE — Unmapped (Signed)
MICU Evening Summary     Date of Service: 12/03/2022    Interval History: Karen Barber is a 57 y.o. female with  bronchiectasis type 1 in the setting of pulmonary sarcoidosis (6L  home O2), HTN, T2DM, COPD, s/p LUL lobectomy, and asthma. Hospital 9/11 for PET, but was hypoxic needing HFNC.   Critical care services are indicated for:    Principal Problem:    Acute on chronic hypoxic respiratory failure (CMS-HCC)  Active Problems:    Pulmonary sarcoidosis (CMS-HCC)    Type 2 diabetes mellitus with hyperglycemia (CMS-HCC)    COPD (chronic obstructive pulmonary disease) (CMS-HCC)    Chronic pulmonary aspergillosis (CMS-HCC)    Pulmonary hypertension (CMS-HCC)    AKI (acute kidney injury) (CMS-HCC)      Sildenefil inceased to home dose during the day  Lasix scheduled TID   At around 1030, as she was moving from commode to bed, dropped map to 40 and symptomatic. Very brief and resolved once back in bed   Neg 1.9L as of 5am   Assessment & Plan     N:  - PT/OT    P:  - atrovent, brovana, HTS, aerobika, pulmicort  - home AVAPS nightly > continue here  - HFNC, weaning --and feeling fairly well on 50% 45L this evening     CV:  - home sidenafil dosing resumed 9/15   - spot dosing IV lasix (takes 80mg  PO BID at home) - resume home lasix dose changed to IV 40mg  9/12-->9/15 changed to TID  - sched Echo after diurese over weekend    R:  - AKI on CKD3  - Purewick    ID:  - recent serratia pna  - Vanc/cefepime---changed to levaquin (9/12---  - home azithro (9/12--- and posa ppx    GI:  - regular diet  - barium swallow needed?-->hold for now    Heme:  - Lovenox ppx    Endo:  - SSI + NPH 8U am /NPH 5U pm  - Takes 18U glargine and SSI at home              Critical Care Attestation     This patient is critically ill or injured with the impairment of vital organ systems such that there is a high probability of imminent or life threatening deterioration in the patient's condition. This patient must remain in the ICU for ongoing evaluation of the comprehensive management plan outlined in this note. I directly provided critical care services as documented in this note and the critical care time spent (45 min) is exclusive of separately billable procedures.  In addition to time spent for critical care management, I also provided advance care planning services for  0  minutes (see GOC above or ACP note for details)???. Total billable critical care time 45 minutes.    Brookelynne Dimperio Paulino Door, PA

## 2022-12-05 LAB — BASIC METABOLIC PANEL
ANION GAP: 7 mmol/L (ref 5–14)
ANION GAP: 9 mmol/L (ref 5–14)
BLOOD UREA NITROGEN: 42 mg/dL — ABNORMAL HIGH (ref 9–23)
BLOOD UREA NITROGEN: 41 mg/dL — ABNORMAL HIGH (ref 9–23)
BUN / CREAT RATIO: 32
BUN / CREAT RATIO: 31
CALCIUM: 9.2 mg/dL (ref 8.7–10.4)
CALCIUM: 8.7 mg/dL (ref 8.7–10.4)
CHLORIDE: 99 mmol/L (ref 98–107)
CHLORIDE: 98 mmol/L (ref 98–107)
CO2: 33 mmol/L — ABNORMAL HIGH (ref 20.0–31.0)
CO2: 30 mmol/L (ref 20.0–31.0)
CREATININE: 1.32 mg/dL — ABNORMAL HIGH
CREATININE: 1.31 mg/dL — ABNORMAL HIGH
EGFR CKD-EPI (2021) FEMALE: 47 mL/min/{1.73_m2} — ABNORMAL LOW (ref >=60)
EGFR CKD-EPI (2021) FEMALE: 47 mL/min/{1.73_m2} — ABNORMAL LOW (ref >=60)
GLUCOSE RANDOM: 202 mg/dL — ABNORMAL HIGH (ref 70–179)
GLUCOSE RANDOM: 345 mg/dL — ABNORMAL HIGH (ref 70–179)
POTASSIUM: 4.1 mmol/L (ref 3.4–4.8)
POTASSIUM: 4.2 mmol/L (ref 3.4–4.8)
SODIUM: 139 mmol/L (ref 135–145)
SODIUM: 137 mmol/L (ref 135–145)

## 2022-12-05 LAB — BLOOD GAS CRITICAL CARE PANEL, ARTERIAL
BASE EXCESS ARTERIAL: 6.5 — ABNORMAL HIGH (ref -2.0–2.0)
BASE EXCESS ARTERIAL: 6.7 — ABNORMAL HIGH (ref -2.0–2.0)
BASE EXCESS ARTERIAL: 4.8 — ABNORMAL HIGH (ref -2.0–2.0)
BASE EXCESS ARTERIAL: 9.1 — ABNORMAL HIGH (ref -2.0–2.0)
BASE EXCESS ARTERIAL: 7.1 — ABNORMAL HIGH (ref -2.0–2.0)
CALCIUM IONIZED ARTERIAL (MG/DL): 4.61 mg/dL (ref 4.40–5.40)
CALCIUM IONIZED ARTERIAL (MG/DL): 4.72 mg/dL (ref 4.40–5.40)
CALCIUM IONIZED ARTERIAL (MG/DL): 4.5 mg/dL (ref 4.40–5.40)
CALCIUM IONIZED ARTERIAL (MG/DL): 4.49 mg/dL (ref 4.40–5.40)
CALCIUM IONIZED ARTERIAL (MG/DL): 4.37 mg/dL — ABNORMAL LOW (ref 4.40–5.40)
GLUCOSE WHOLE BLOOD: 138 mg/dL (ref 70–179)
GLUCOSE WHOLE BLOOD: 135 mg/dL (ref 70–179)
GLUCOSE WHOLE BLOOD: 275 mg/dL — ABNORMAL HIGH (ref 70–179)
GLUCOSE WHOLE BLOOD: 205 mg/dL — ABNORMAL HIGH (ref 70–179)
GLUCOSE WHOLE BLOOD: 331 mg/dL — ABNORMAL HIGH (ref 70–179)
HCO3 ARTERIAL: 34 mmol/L — ABNORMAL HIGH (ref 22–27)
HCO3 ARTERIAL: 32 mmol/L — ABNORMAL HIGH (ref 22–27)
HCO3 ARTERIAL: 29 mmol/L — ABNORMAL HIGH (ref 22–27)
HCO3 ARTERIAL: 35 mmol/L — ABNORMAL HIGH (ref 22–27)
HCO3 ARTERIAL: 33 mmol/L — ABNORMAL HIGH (ref 22–27)
HEMOGLOBIN BLOOD GAS: 11.5 g/dL — ABNORMAL LOW (ref 12.00–16.00)
HEMOGLOBIN BLOOD GAS: 11.9 g/dL — ABNORMAL LOW
HEMOGLOBIN BLOOD GAS: 11.5 g/dL — ABNORMAL LOW
HEMOGLOBIN BLOOD GAS: 11.2 g/dL — ABNORMAL LOW (ref 12.00–16.00)
HEMOGLOBIN BLOOD GAS: 11.3 g/dL — ABNORMAL LOW (ref 12.00–16.00)
LACTATE BLOOD ARTERIAL: 0.9 mmol/L (ref <1.3)
LACTATE BLOOD ARTERIAL: 1.4 mmol/L — ABNORMAL HIGH (ref <1.3)
LACTATE BLOOD ARTERIAL: 3.2 mmol/L — ABNORMAL HIGH (ref <1.3)
LACTATE BLOOD ARTERIAL: 1.8 mmol/L — ABNORMAL HIGH (ref <1.3)
LACTATE BLOOD ARTERIAL: 2.7 mmol/L — ABNORMAL HIGH (ref <1.3)
O2 SATURATION ARTERIAL: >100 % — ABNORMAL HIGH (ref 94.0–100.0)
O2 SATURATION ARTERIAL: 92.7 % — ABNORMAL LOW (ref 94.0–100.0)
O2 SATURATION ARTERIAL: 92.4 % — ABNORMAL LOW (ref 94.0–100.0)
O2 SATURATION ARTERIAL: 95.2 % (ref 94.0–100.0)
O2 SATURATION ARTERIAL: 86.6 % — ABNORMAL LOW (ref 94.0–100.0)
PCO2 ARTERIAL: 65.6 mmHg (ref 35.0–45.0)
PCO2 ARTERIAL: 57.5 mmHg — ABNORMAL HIGH (ref 35.0–45.0)
PCO2 ARTERIAL: 47.4 mmHg — ABNORMAL HIGH (ref 35.0–45.0)
PCO2 ARTERIAL: 53.5 mmHg — ABNORMAL HIGH (ref 35.0–45.0)
PCO2 ARTERIAL: 50.5 mmHg — ABNORMAL HIGH (ref 35.0–45.0)
PH ARTERIAL: 7.33 — ABNORMAL LOW (ref 7.35–7.45)
PH ARTERIAL: 7.36 (ref 7.35–7.45)
PH ARTERIAL: 7.41 (ref 7.35–7.45)
PH ARTERIAL: 7.42 (ref 7.35–7.45)
PH ARTERIAL: 7.42 (ref 7.35–7.45)
PO2 ARTERIAL: 197 mmHg — ABNORMAL HIGH (ref 80.0–110.0)
PO2 ARTERIAL: 66 mmHg — ABNORMAL LOW (ref 80.0–110.0)
PO2 ARTERIAL: 61.1 mmHg — ABNORMAL LOW (ref 80.0–110.0)
PO2 ARTERIAL: 77.7 mmHg — ABNORMAL LOW (ref 80.0–110.0)
PO2 ARTERIAL: 52.6 mmHg — ABNORMAL LOW (ref 80.0–110.0)
POTASSIUM WHOLE BLOOD: 4 mmol/L (ref 3.4–4.6)
POTASSIUM WHOLE BLOOD: 4.2 mmol/L (ref 3.4–4.6)
POTASSIUM WHOLE BLOOD: 3.8 mmol/L (ref 3.4–4.6)
POTASSIUM WHOLE BLOOD: 3.9 mmol/L (ref 3.4–4.6)
POTASSIUM WHOLE BLOOD: 4.1 mmol/L (ref 3.4–4.6)
SODIUM WHOLE BLOOD: 142 mmol/L (ref 135–145)
SODIUM WHOLE BLOOD: 141 mmol/L (ref 135–145)
SODIUM WHOLE BLOOD: 138 mmol/L (ref 135–145)
SODIUM WHOLE BLOOD: 141 mmol/L (ref 135–145)
SODIUM WHOLE BLOOD: 139 mmol/L (ref 135–145)

## 2022-12-05 LAB — CBC
HEMATOCRIT: 33.5 % — ABNORMAL LOW (ref 34.0–44.0)
HEMOGLOBIN: 10.8 g/dL — ABNORMAL LOW (ref 11.3–14.9)
MEAN CORPUSCULAR HEMOGLOBIN CONC: 32.4 g/dL (ref 32.0–36.0)
MEAN CORPUSCULAR HEMOGLOBIN: 26.5 pg (ref 25.9–32.4)
MEAN CORPUSCULAR VOLUME: 81.8 fL (ref 77.6–95.7)
MEAN PLATELET VOLUME: 8.4 fL (ref 6.8–10.7)
PLATELET COUNT: 246 10*9/L (ref 150–450)
RED BLOOD CELL COUNT: 4.09 10*12/L (ref 3.95–5.13)
RED CELL DISTRIBUTION WIDTH: 16.7 % — ABNORMAL HIGH (ref 12.2–15.2)
WBC ADJUSTED: 4.9 10*9/L (ref 3.6–11.2)

## 2022-12-05 LAB — PHOSPHORUS: PHOSPHORUS: 3.8 mg/dL (ref 2.4–5.1)

## 2022-12-05 MED ADMIN — enoxaparin (LOVENOX) syringe 40 mg: 40 mg | SUBCUTANEOUS | @ 01:00:00

## 2022-12-05 MED ADMIN — gabapentin (NEURONTIN) capsule 300 mg: 300 mg | ORAL | @ 12:00:00

## 2022-12-05 MED ADMIN — budesonide (PULMICORT) nebulizer solution 0.5 mg: .5 mg | RESPIRATORY_TRACT | @ 01:00:00

## 2022-12-05 MED ADMIN — insulin lispro (HumaLOG) injection 0-20 Units: 0-20 [IU] | SUBCUTANEOUS | @ 16:00:00

## 2022-12-05 MED ADMIN — sildenafiL (pulm.hypertension) (REVATIO) tablet 40 mg: 40 mg | ORAL | @ 01:00:00

## 2022-12-05 MED ADMIN — magnesium oxide-Mg AA chelate (Magnesium Plus Protein) 2 tablet: 2 | ORAL | @ 01:00:00

## 2022-12-05 MED ADMIN — arformoterol (BROVANA) nebulizer solution 15 mcg/2 mL: 15 ug | RESPIRATORY_TRACT | @ 13:00:00

## 2022-12-05 MED ADMIN — furosemide (LASIX) injection 40 mg: 40 mg | INTRAVENOUS | @ 08:00:00

## 2022-12-05 MED ADMIN — arformoterol (BROVANA) nebulizer solution 15 mcg/2 mL: 15 ug | RESPIRATORY_TRACT | @ 01:00:00

## 2022-12-05 MED ADMIN — sildenafiL (pulm.hypertension) (REVATIO) tablet 40 mg: 40 mg | ORAL | @ 18:00:00

## 2022-12-05 MED ADMIN — gabapentin (NEURONTIN) capsule 300 mg: 300 mg | ORAL | @ 18:00:00

## 2022-12-05 MED ADMIN — posaconazole (NOXAFIL) delayed released tablet 200 mg: 200 mg | ORAL | @ 12:00:00

## 2022-12-05 MED ADMIN — furosemide (LASIX) injection 40 mg: 40 mg | INTRAVENOUS | @ 16:00:00

## 2022-12-05 MED ADMIN — gabapentin (NEURONTIN) capsule 300 mg: 300 mg | ORAL | @ 01:00:00

## 2022-12-05 MED ADMIN — sodium chloride 3 % NEBULIZER solution 4 mL: 4 mL | RESPIRATORY_TRACT | @ 13:00:00

## 2022-12-05 MED ADMIN — azithromycin (ZITHROMAX) tablet 250 mg: 250 mg | ORAL | @ 12:00:00 | Stop: 2025-08-25

## 2022-12-05 MED ADMIN — budesonide (PULMICORT) nebulizer solution 0.5 mg: .5 mg | RESPIRATORY_TRACT | @ 13:00:00

## 2022-12-05 MED ADMIN — ipratropium (ATROVENT) 0.02 % nebulizer solution 500 mcg: 500 ug | RESPIRATORY_TRACT | @ 13:00:00

## 2022-12-05 MED ADMIN — furosemide (LASIX) injection 40 mg: 40 mg | INTRAVENOUS | @ 21:00:00

## 2022-12-05 MED ADMIN — acetaminophen (TYLENOL) tablet 650 mg: 650 mg | ORAL | @ 23:00:00

## 2022-12-05 MED ADMIN — sodium chloride 3 % NEBULIZER solution 4 mL: 4 mL | RESPIRATORY_TRACT | @ 01:00:00

## 2022-12-05 MED ADMIN — ipratropium (ATROVENT) 0.02 % nebulizer solution 500 mcg: 500 ug | RESPIRATORY_TRACT | @ 01:00:00

## 2022-12-05 MED ADMIN — sildenafiL (pulm.hypertension) (REVATIO) tablet 40 mg: 40 mg | ORAL | @ 12:00:00

## 2022-12-05 MED ADMIN — magnesium oxide-Mg AA chelate (Magnesium Plus Protein) 2 tablet: 2 | ORAL | @ 12:00:00

## 2022-12-05 MED ADMIN — insulin NPH (HumuLIN,NovoLIN) injection 8 Units: 8 [IU] | SUBCUTANEOUS | @ 13:00:00

## 2022-12-05 MED ADMIN — insulin lispro (HumaLOG) injection 0-20 Units: 0-20 [IU] | SUBCUTANEOUS | @ 01:00:00

## 2022-12-05 MED ADMIN — acetaminophen (TYLENOL) tablet 650 mg: 650 mg | ORAL | @ 01:00:00

## 2022-12-05 NOTE — Unmapped (Signed)
7a shift summary:  Patient able to turn and reposition self. Sat up in chair. Patient remained free from falls/injury. Monitoring VS, remained afebrile. Bed low and locked. No complaints expressed at this time, will continue to monitor.    Problem: Adult Inpatient Plan of Care  Goal: Plan of Care Review  Outcome: Ongoing - Unchanged  Goal: Patient-Specific Goal (Individualized)  Outcome: Ongoing - Unchanged  Goal: Absence of Hospital-Acquired Illness or Injury  Outcome: Ongoing - Unchanged  Intervention: Identify and Manage Fall Risk  Recent Flowsheet Documentation  Taken 12/05/2022 0800 by Geanie Berlin, RN  Safety Interventions:   aspiration precautions   fall reduction program maintained   isolation precautions   lighting adjusted for tasks/safety   low bed   nonskid shoes/slippers when out of bed  Intervention: Prevent Skin Injury  Recent Flowsheet Documentation  Taken 12/05/2022 0800 by Geanie Berlin, RN  Positioning for Skin: Sitting in Chair  Intervention: Prevent and Manage VTE (Venous Thromboembolism) Risk  Recent Flowsheet Documentation  Taken 12/05/2022 1600 by Geanie Berlin, RN  Anti-Embolism Device Type: SCD, Knee  Anti-Embolism Intervention: Refused  Anti-Embolism Device Location: BLE  Taken 12/05/2022 1400 by Geanie Berlin, RN  Anti-Embolism Device Type: SCD, Knee  Anti-Embolism Intervention: Refused  Anti-Embolism Device Location: BLE  Taken 12/05/2022 1200 by Geanie Berlin, RN  Anti-Embolism Device Type: SCD, Knee  Anti-Embolism Intervention: Refused  Anti-Embolism Device Location: BLE  Taken 12/05/2022 1000 by Geanie Berlin, RN  Anti-Embolism Device Type: SCD, Knee  Anti-Embolism Intervention: Refused  Anti-Embolism Device Location: BLE  Taken 12/05/2022 0800 by Geanie Berlin, RN  Anti-Embolism Device Type: SCD, Knee  Anti-Embolism Intervention: Refused  Anti-Embolism Device Location: BLE  Goal: Optimal Comfort and Wellbeing  Outcome: Ongoing - Unchanged  Goal: Readiness for Transition of Care  Outcome: Ongoing - Unchanged  Goal: Rounds/Family Conference  Outcome: Ongoing - Unchanged     Problem: Fall Injury Risk  Goal: Absence of Fall and Fall-Related Injury  Outcome: Ongoing - Unchanged  Intervention: Promote Scientist, clinical (histocompatibility and immunogenetics) Documentation  Taken 12/05/2022 0800 by Geanie Berlin, RN  Safety Interventions:   aspiration precautions   fall reduction program maintained   isolation precautions   lighting adjusted for tasks/safety   low bed   nonskid shoes/slippers when out of bed     Problem: Gas Exchange Impaired  Goal: Optimal Gas Exchange  Outcome: Ongoing - Unchanged  Intervention: Optimize Oxygenation and Ventilation  Recent Flowsheet Documentation  Taken 12/05/2022 1600 by Geanie Berlin, RN  Head of Bed [facility]) Positioning: HOB at 30-45 degrees  Taken 12/05/2022 1200 by Geanie Berlin, RN  Head of Bed [facility]) Positioning: HOB at 30-45 degrees  Taken 12/05/2022 0800 by Geanie Berlin, RN  Head of Bed [facility]) Positioning: HOB at 30-45 degrees

## 2022-12-05 NOTE — Unmapped (Signed)
MICU Evening Summary     Date of Service: 12/04/2022    Interval History: Karen Barber is a 57 y.o. female with  bronchiectasis type 1 in the setting of pulmonary sarcoidosis (6L [place] home O2), HTN, T2DM, COPD, s/p LUL lobectomy, and asthma. Hospital 9/11 for PET, but was hypoxic needing HFNC.   Critical care services are indicated for:    Principal Problem:    Acute on chronic hypoxic respiratory failure (CMS-HCC)  Active Problems:    Pulmonary sarcoidosis (CMS-HCC)    Type 2 diabetes mellitus with hyperglycemia (CMS-HCC)    COPD (chronic obstructive pulmonary disease) (CMS-HCC)    Chronic pulmonary aspergillosis (CMS-HCC)    Pulmonary hypertension (CMS-HCC)    AKI (acute kidney injury) (CMS-HCC)      Weaned to 8L [facility]  One brief episode of hypotension during the day so kept ICU status  Sildenfil/lasix dosing spaced out during day  Only -600 neg as of this morning prior to next lasix dose.    Assessment & Plan     N:  - PT/OT    P:  - atrovent, brovana, HTS, aerobika, pulmicort  - home AVAPS nightly > continue here  - HFNC, weaning --and feeling fairly well on 50% 45L this evening     CV:  - home sidenafil dosing resumed 9/15   - spot dosing IV lasix (takes 80mg  PO BID at home) - resume home lasix dose changed to IV 40mg  9/12-->9/15 changed to TID  - sched Echo after diurese over weekend    R:  - AKI on CKD3  - Purewick    ID:  - recent serratia pna  - Vanc/cefepime---changed to levaquin (9/12---9/16)  - home azithro (9/12--- and posa ppx    GI:  - regular diet  - barium swallow needed?-->hold for now    Heme:  - Lovenox ppx    Endo:  - SSI + NPH 8U am /NPH 5U pm  - Takes 18U glargine and SSI at home              Critical Care Attestation     This patient is critically ill or injured with the impairment of vital organ systems such that there is a high probability of imminent or life threatening deterioration in the patient's condition. This patient must remain in the ICU for ongoing evaluation of the comprehensive management plan outlined in this note. I directly provided critical care services as documented in this note and the critical care time spent ( ) is exclusive of separately billable procedures.  In addition to time spent for critical care management, I also provided advance care planning services for  0  minutes (see GOC above or ACP note for details)???. Total billable critical care time 35 minutes.    Laiden Milles Paulino Door, PA

## 2022-12-05 NOTE — Unmapped (Signed)
MICU Progress Note     Date of Service: 12/05/2022    Problem List:   Principal Problem:    Acute on chronic hypoxic respiratory failure (CMS-HCC)  Active Problems:    Pulmonary sarcoidosis (CMS-HCC)    Type 2 diabetes mellitus with hyperglycemia (CMS-HCC)    COPD (chronic obstructive pulmonary disease) (CMS-HCC)    Chronic pulmonary aspergillosis (CMS-HCC)    Pulmonary hypertension (CMS-HCC)    AKI (acute kidney injury) (CMS-HCC)      HPI: Karen Barber is a 57 y.o. female with a past medical history of bronchiectasis type 1 in the setting of pulmonary sarcoidosis (6L [place] home O2), HTN, T2DM, COPD, s/p LUL lobectomy, and asthma that initially presented today to the hospital for a PET scan to evaluate her sarcoidosis. To prepare for her appointment, she states that she was told to not take any of her home medicines, including her home sidenafil, lasix, nebulizers, and inhalers.     Upon presentation to the appointment, she was found to have an increased work of breathing and her SPO2 was in the 70s on her home [facility], prompting her to be evaluated in the ED. As her respiratory status did not improve with NRB, she was placed on HFNC. 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     Of note, the patient had a recent admission for pneumonia from 11/05/2022 to 11/12/2022, during which she was followed by the pulm consult team. While the patient was discharged on her baseline O2 need, the patient states that she never really returned to her baseline respiratory status. She endorses having ongoing shortness of breath since discharge and stated that she frequently would increase the flow of her [place] at home.     In the ED, the patient was without a fever or leukocytosis, lactate was non-elevated, electrolytes were within an appropriate range. She denied any recent fevers, chest pain, and no lower extremity leg swelling was appreciated. She received one dose of 125mg  of IV methylprednisolone and then was transferred to the MICU for further management of her hypoxia.     24hr events:   - Altered times of sidenafil and IV lasix so that patient would not be receiving those medications at the same time. No further episodes of hypotension since that change made yesterday afternoon.   - Waxing/waning O2 need (5-10L LBNC)     Neurological   Chronic Pain   - Continuing home gabapentin   - PT/OT consulted, appreciate recs     Analgesia: No pain issues  RASS at goal? N/A, not on sedation  [place] Agitation Assessment Scale (RASS) : 0 (12/05/2022  8:00 AM)       Pulmonary   Acute on chronic hypoxemic respiratory failure - Pulmonary Sarcoidosis - COPD  Patient with history of COPD, pulmonary sarcoidosis, and PAH (on 6L [place] at baseline) that presented to the hospital 9/11 with SPO2 in the 70s on home O2. To assist with her hypoxia, she was placed on HFNC. 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 Of note, patient endorses increasing the flow of her [place] frequently when at home. This is a new change after her recent admission for pneumonia.   - Continue to wean O2 as able. May need to consider a new O2 setting for home.   - Transitioned to [facility] from HFNC on 9/16  - Goal SPO2 > 90%  - home AVAPS at night > pt on it during last hospitalization in August 2024, however, unable to get  home set up coordinated prior to re-admission. Holding off on non-invasive ventilation for now given large blebs.   - Continue home airway clearance in the setting of bronchiectasis (Aerobika and HTS neb). Of note, during previous admission, also used Pari Proneb Max (see note from Dr. Stasia Cavalier on 8/24). May need to consider.   - Continue home pulmicort, brovana, atrovent and home PRN albuterol   - CXR as above   - Received a one time dose of 125mg  of IV methylpred in the ED    O2 Device: Large bore nasal cannula (cool high flow)  O2 Flow Rate (L/min):  [5 L/min-10 L/min] 9 L/min    Cardiovascular   Pulmonary hypertension - RV Failure  Patient with history of PAH. Last echo on 10/23/22 showing LVEF with normal size but increased wall thickness, LVED at > 55%, severe pulmonary HTN, RV severely dilated in size with reduced systolic function, RA mildly dilated.   - repeat Echo on 9/17 showing LVEF > 55, RV severely dilated in size/mildly reduced systolic function, RA moderately dilated in size, severe pulm HTN, estimated PASP at 86.   - Trend daily BNP   - At home takes lasix 80mg  BID. Currently on 40mg  IV lasix TID. Diuresing well (initially on 40mg  IV BID, but needed an additional dose for optimal output).   - Continue home sidenafil 40mg  TID  - Strict Is/Os and daily weights     Renal   AKI on CKD3  Patient with baseline creatinine between 1.2-1.4. Admitted with creatinine at 1.8, likely in the setting of hypoxemia.   - Consider placement of foley catheter for closer I/O monitoring   - Monitoring electrolytes and creatinine with daily BMP   - Renally dose medications, avoid nephrotoxic agents     Infectious Disease/Autoimmune   Potential for Respiratory Infection   Patient without fever, leukocytosis, lactate elevation, however, presented with increased work of breathing, shortness of breath, and hypoxemia. CXR showing extensive pulmonary fibrosis, but unable to clearly evaluate for presence of pneumonia on film. Of note, patient admitted from 8/18-8/25 with Serratia pneumonia. There was concern for potential chronic aspiration. Planned to have outpatient barium swallow to evaluate esophagus.   - Started on vanc/cefepime for broad coverage --> stopped vanc and cefepime 9/12- then levaquin (9/12---9/16)  - MRSA screen neg  - RPP neg   - Will send off sputum sample if able   - continued azithromycin (9/12---    Chronic pulmonary aspergillosis   Continue home posa ppx    Cultures:  Lower Respiratory Culture (no units)   Date Value   11/08/2022 3+ Serratia marcescens (A)   11/08/2022 1+ Oropharyngeal Flora Isolated     WBC (10*9/L)   Date Value   12/05/2022 4.9 FEN/GI   Nutrition:   - reg diet    - No indication for GI ppx at this time   - Miralax daily and senna PRN   - barium swallow pending for prior c/f aspiration events-->hold off for now w/barium swallow; patient may follow up outpatient    Provider Malnutrition Assessment:  Body mass index is 25.89 kg/m??.  GLIM criteria:   Pt does not meet criteria  -I have screened this patient for malnutrition and they did NOT meet criteria for malnutrition based on GLIM criteria.  -Nutrition consulted no    Heme/Coag   Chronic Anemia, likely IDA   Iron deficiency anemia is associated with pulmonary hypertension in multiple studies.   - H/H up from last admission, appropriate  at this time   - Platelets appropiate   - Continue lovenox ppx (may need to switch to heparin if AKI worsens)   -  iron studies Iron (37) TIBC (254) Iron sat (15)    Endocrine   Type 2 diabetes mellitus with hyperglycemia   HgbA1c 8.3% July 2024, Home regimen of glargine 18 units nightly, sliding scale insulin.  - NPH 15 bid w/ SSI 9/11-> 9/12 NPH increased to 25U BID  - 9/13 low blood glucose overnight; NPH adjusted -->9/14 NPH 16U in am / NPH 10U pm-->9/15 re-adjusted NPH 8U in am/NPH 5U pm   - 9/17: insulin at NPH 8U BID, noted to have increased blood glucoses after dinner in the evenings     Integumentary   NAI  - WOCN consulted for high risk skin assessment No. Reason: not indicated .  - cont pressure mitigating precautions per skin policy    Prophylaxis/LDA/Restraints/Consults   ICU Checklist completed: yes (see ICU rounding navigator in Epic)      Patient Lines/Drains/Airways Status       Active Active Lines, Drains, & Airways       Name Placement date Placement time Site Days    External Urinary Device 12/02/22 With Suction 12/02/22  1900  -- 2    Peripheral IV 11/29/22 Left;Posterior Hand 11/29/22  1947  Hand  5    Peripheral IV 11/30/22 Right Forearm 11/30/22  0300  Forearm  5    Arterial Line 11/29/22 Left Radial 11/29/22  2108  Radial  5 Patient Lines/Drains/Airways Status       Active Wounds       None                    Goals of Care     Code Status:  Full Code.     Designated Chiropodist Maker:  Ms. Brasile current decisional capacity for healthcare decision-making is Full capacity. Her designated Educational psychologist) is/are   HCDM (patient stated preference): [name] - Media planner - [phone].      Subjective   States that she is feeling okay today.     Objective     Vitals - past 24 hours  Temp:  [36.3 ??C (97.4 ??F)-36.8 ??C (98.3 ??F)] 36.4 ??C (97.5 ??F)  Heart Rate:  [72-98] 89  SpO2 Pulse:  [74-99] 90  Resp:  [14-42] 26  BP: (82-116)/(59-81) 82/59  SpO2:  [85 %-100 %] 98 % Intake/Output  I/O last 3 completed shifts:  In: 970 [P.O.:970]  Out: 3170 [Urine:3170]     Physical Exam:    General: sitting upright in bed, tachypneic, in NAD  HEENT: head atraumatic, normocephalic. PERRL. Sclera non-icteric.   CV: regular rhythm. No m/g/r. Capillary refill less than 3 seconds in all extremities.   Pulm: breath sounds diminished throughout lung fields bilaterally with intermittent expiratory wheezes bilaterally. Chest expansion symmetrical.   GI: Abdomen is soft/non-tender/non-distended. Active bowel sounds in all quadrants.   MSK: ROM full in all extremities.   Skin: No rashes of lesions noted   Neuro: Alert/ oriented x4 follows commands in all extremities.     The patient's hospital stay has been complicated by the following clinically significant conditions requiring additional evaluation and treatment or having a significant effect of this patient's care: - Anemia POA requiring further investigation or monitoring  - Chronic kidney disease POA requiring further investigation, treatment, or monitoring    Body mass index is 25.89 kg/m??.  Wt Readings from Last 12 Encounters:   11/30/22 84.2 kg (185 lb 10 oz)   11/11/22 90.1 kg (198 lb 10.2 oz)   10/31/22 84.8 kg (187 lb)   10/22/22 84 kg (185 lb 3 oz) 09/25/22 83.9 kg (185 lb)   06/12/22 89.8 kg (198 lb)   05/16/22 89.4 kg (197 lb)   06/29/21 94.8 kg (208 lb 15.9 oz)   04/19/21 94.8 kg (209 lb)   11/02/20 98 kg (216 lb)   07/20/20 97.1 kg (214 lb)   06/08/20 97.1 kg (214 lb)       Continuous Infusions:       Scheduled Medications:    arformoterol  15 mcg Nebulization BID (RT)    azithromycin  250 mg Oral Daily    budesonide  0.5 mg Nebulization BID (RT)    enoxaparin (LOVENOX) injection  40 mg Subcutaneous Nightly    furosemide  40 mg Intravenous TID    gabapentin  300 mg Oral TID    insulin lispro  0-20 Units Subcutaneous ACHS    insulin NPH  8 Units Subcutaneous Q12H SCH    ipratropium  500 mcg Nebulization BID (RT)    magnesium oxide-Mg AA chelate  2 tablet Oral BID    polyethylene glycol  17 g Oral Daily    posaconazole  200 mg Oral Daily    sildenafiL (pulm.hypertension)  40 mg Oral TID    sodium chloride  4 mL Nebulization BID (RT)       PRN medications:  acetaminophen, albuterol, calcium carbonate, dextrose in water, glucagon, ondansetron **OR** ondansetron, senna, sodium chloride    Data/Imaging Review: Reviewed in Epic and personally interpreted on 12/05/2022. See EMR for detailed results.        Cathren Sween Orion Modest, AGNP

## 2022-12-05 NOTE — Unmapped (Signed)
Pt had no overnight events. VSS. On 5L LBNC. See MAR and flowsheet for more.     Problem: Adult Inpatient Plan of Care  Goal: Absence of Hospital-Acquired Illness or Injury  Intervention: Identify and Manage Fall Risk  Recent Flowsheet Documentation  Taken 12/04/2022 2000 by Donnal Debar, RN  Safety Interventions:   aspiration precautions   bleeding precautions   environmental modification   infection management   fall reduction program maintained   low bed  Intervention: Prevent Skin Injury  Recent Flowsheet Documentation  Taken 12/05/2022 0400 by Donnal Debar, RN  Positioning for Skin: Supine/Back  Taken 12/05/2022 0200 by Donnal Debar, RN  Positioning for Skin: Supine/Back  Taken 12/05/2022 0000 by Donnal Debar, RN  Positioning for Skin: Supine/Back  Taken 12/04/2022 2200 by Donnal Debar, RN  Positioning for Skin: Supine/Back  Taken 12/04/2022 2000 by Donnal Debar, RN  Positioning for Skin: Sitting in Chair  Device Skin Pressure Protection: absorbent pad utilized/changed  Skin Protection:   incontinence pads utilized   protective footwear used   transparent dressing maintained  Intervention: Prevent and Manage VTE (Venous Thromboembolism) Risk  Recent Flowsheet Documentation  Taken 12/04/2022 2000 by Donnal Debar, RN  VTE Prevention/Management: bleeding precautions maintained  Intervention: Prevent Infection  Recent Flowsheet Documentation  Taken 12/04/2022 2000 by Donnal Debar, RN  Infection Prevention:   cohorting utilized   environmental surveillance performed   equipment surfaces disinfected   hand hygiene promoted   personal protective equipment utilized     Problem: Fall Injury Risk  Goal: Absence of Fall and Fall-Related Injury  Intervention: Promote Injury-Free Environment  Recent Flowsheet Documentation  Taken 12/04/2022 2000 by Donnal Debar, RN  Safety Interventions:   aspiration precautions   bleeding precautions   environmental modification   infection management fall reduction program maintained   low bed     Problem: Gas Exchange Impaired  Goal: Optimal Gas Exchange  Intervention: Optimize Oxygenation and Ventilation  Recent Flowsheet Documentation  Taken 12/04/2022 2000 by Donnal Debar, RN  Head of Bed [facility]) Positioning: HOB at 30-45 degrees

## 2022-12-06 LAB — CBC
HEMATOCRIT: 33 % — ABNORMAL LOW (ref 34.0–44.0)
HEMOGLOBIN: 11 g/dL — ABNORMAL LOW (ref 11.3–14.9)
MEAN CORPUSCULAR HEMOGLOBIN CONC: 33.3 g/dL (ref 32.0–36.0)
MEAN CORPUSCULAR HEMOGLOBIN: 27 pg (ref 25.9–32.4)
MEAN CORPUSCULAR VOLUME: 81.1 fL (ref 77.6–95.7)
MEAN PLATELET VOLUME: 8.3 fL (ref 6.8–10.7)
PLATELET COUNT: 248 10*9/L (ref 150–450)
RED BLOOD CELL COUNT: 4.07 10*12/L (ref 3.95–5.13)
RED CELL DISTRIBUTION WIDTH: 16.8 % — ABNORMAL HIGH (ref 12.2–15.2)
WBC ADJUSTED: 6.8 10*9/L (ref 3.6–11.2)

## 2022-12-06 LAB — POSACONAZOLE: POSACONAZOLE LEVEL: 621 ng/mL

## 2022-12-06 LAB — MAGNESIUM: MAGNESIUM: 1.8 mg/dL (ref 1.6–2.6)

## 2022-12-06 LAB — PHOSPHORUS: PHOSPHORUS: 4.1 mg/dL (ref 2.4–5.1)

## 2022-12-06 MED ADMIN — sildenafiL (pulm.hypertension) (REVATIO) tablet 40 mg: 40 mg | ORAL | @ 01:00:00

## 2022-12-06 MED ADMIN — furosemide (LASIX) injection 40 mg: 40 mg | INTRAVENOUS | @ 20:00:00

## 2022-12-06 MED ADMIN — sodium chloride 3 % NEBULIZER solution 4 mL: 4 mL | RESPIRATORY_TRACT | @ 12:00:00

## 2022-12-06 MED ADMIN — furosemide (LASIX) injection 40 mg: 40 mg | INTRAVENOUS | @ 07:00:00 | Stop: 2022-12-06

## 2022-12-06 MED ADMIN — insulin NPH (HumuLIN,NovoLIN) injection 8 Units: 8 [IU] | SUBCUTANEOUS | @ 01:00:00

## 2022-12-06 MED ADMIN — sildenafiL (pulm.hypertension) (REVATIO) tablet 40 mg: 40 mg | ORAL | @ 20:00:00

## 2022-12-06 MED ADMIN — budesonide (PULMICORT) nebulizer solution 0.5 mg: .5 mg | RESPIRATORY_TRACT | @ 12:00:00

## 2022-12-06 MED ADMIN — arformoterol (BROVANA) nebulizer solution 15 mcg/2 mL: 15 ug | RESPIRATORY_TRACT

## 2022-12-06 MED ADMIN — gabapentin (NEURONTIN) capsule 300 mg: 300 mg | ORAL | @ 13:00:00

## 2022-12-06 MED ADMIN — azithromycin (ZITHROMAX) tablet 250 mg: 250 mg | ORAL | @ 13:00:00 | Stop: 2025-08-25

## 2022-12-06 MED ADMIN — Fluorine F-18 FDG 4-40 mCi IV: 12.97 | INTRAVENOUS | @ 18:00:00 | Stop: 2022-12-06

## 2022-12-06 MED ADMIN — posaconazole (NOXAFIL) delayed released tablet 200 mg: 200 mg | ORAL | @ 13:00:00

## 2022-12-06 MED ADMIN — budesonide (PULMICORT) nebulizer solution 0.5 mg: .5 mg | RESPIRATORY_TRACT

## 2022-12-06 MED ADMIN — magnesium oxide-Mg AA chelate (Magnesium Plus Protein) 2 tablet: 2 | ORAL | @ 13:00:00

## 2022-12-06 MED ADMIN — magnesium oxide-Mg AA chelate (Magnesium Plus Protein) 2 tablet: 2 | ORAL | @ 01:00:00

## 2022-12-06 MED ADMIN — ipratropium (ATROVENT) 0.02 % nebulizer solution 500 mcg: 500 ug | RESPIRATORY_TRACT

## 2022-12-06 MED ADMIN — gabapentin (NEURONTIN) capsule 300 mg: 300 mg | ORAL | @ 01:00:00

## 2022-12-06 MED ADMIN — ipratropium (ATROVENT) 0.02 % nebulizer solution 500 mcg: 500 ug | RESPIRATORY_TRACT | @ 12:00:00

## 2022-12-06 MED ADMIN — sildenafiL (pulm.hypertension) (REVATIO) tablet 40 mg: 40 mg | ORAL | @ 13:00:00

## 2022-12-06 MED ADMIN — arformoterol (BROVANA) nebulizer solution 15 mcg/2 mL: 15 ug | RESPIRATORY_TRACT | @ 12:00:00

## 2022-12-06 MED ADMIN — enoxaparin (LOVENOX) syringe 40 mg: 40 mg | SUBCUTANEOUS | @ 01:00:00

## 2022-12-06 MED ADMIN — sodium chloride 3 % NEBULIZER solution 4 mL: 4 mL | RESPIRATORY_TRACT

## 2022-12-06 MED ADMIN — gabapentin (NEURONTIN) capsule 300 mg: 300 mg | ORAL | @ 20:00:00

## 2022-12-06 MED ADMIN — insulin lispro (HumaLOG) injection 0-20 Units: 0-20 [IU] | SUBCUTANEOUS | @ 01:00:00

## 2022-12-06 NOTE — Unmapped (Signed)
ICU TRANSPORT NOTE    Destination: PET scan    Departing Unit: MPCU  Pickup Time: 1400    Return Unit: MPCU  Return Time: 1540    [place] patient ID band verified  Allergies Reviewed  Code Status at time of transport: DNR/DNI    Report received from primary nurse via [employer]. Handoff performed . Patient transported via stretcher under stepdown level of care. See vital signs during transport via [employer]. O2 via [place] @ 8 L. Patient is alert and following commands. Patient tolerated procedure/scan well. Universal precautions maintained throughout transport.    Update and care given to primary nurse. See Doc Flowsheets/MAR for additional transportation documentation. Proper body mechanics and safe patient handling equipment were utilized throughout transport.

## 2022-12-06 NOTE — Unmapped (Signed)
Pt transferred from MICU overnight. Pt alert and oriented x 4, no complaints of pain. Pt on 8L LBNC but required a bit more with activity. BP stable, L radial art line in place. Pt with no needs at this time, will continue to monitor.      Problem: Adult Inpatient Plan of Care  Goal: Plan of Care Review  Outcome: Progressing  Goal: Patient-Specific Goal (Individualized)  Outcome: Progressing  Goal: Absence of Hospital-Acquired Illness or Injury  Outcome: Progressing  Intervention: Identify and Manage Fall Risk  Recent Flowsheet Documentation  Taken 12/06/2022 0400 by Heidi Dach, RN  Safety Interventions:   environmental modification   fall reduction program maintained   lighting adjusted for tasks/safety   low bed  Goal: Optimal Comfort and Wellbeing  Outcome: Progressing  Goal: Readiness for Transition of Care  Outcome: Progressing  Goal: Rounds/Family Conference  Outcome: Progressing     Problem: Fall Injury Risk  Goal: Absence of Fall and Fall-Related Injury  Outcome: Progressing  Intervention: Promote Scientist, clinical (histocompatibility and immunogenetics) Documentation  Taken 12/06/2022 0400 by Heidi Dach, RN  Safety Interventions:   environmental modification   fall reduction program maintained   lighting adjusted for tasks/safety   low bed     Problem: Gas Exchange Impaired  Goal: Optimal Gas Exchange  Outcome: Progressing  Intervention: Optimize Oxygenation and Ventilation  Recent Flowsheet Documentation  Taken 12/06/2022 0400 by Heidi Dach, RN  Head of Bed [facility]) Positioning: HOB at 30-45 degrees

## 2022-12-06 NOTE — Unmapped (Incomplete)
Azole Antifungal Therapeutic Monitoring Pharmacy Note    Karen Barber is a 57 y.o. female continuing posaconazole.     Indication:  Fungal ppx for chronic pulmonary aspergillosis    Prior Dosing Information: Home regimen posaconazole DR 200 mg daily     Goals:  Therapeutic Drug Levels  Trough level:  > 700 ng/mL (ppx)     Additional Clinical Monitoring/Outcomes  Monitor QTc, renal function (SCr and UOP), and liver function (LFTs)    Results:  Posaconazole level:  621 mcg/mL, drawn few min prior to dose       Wt Readings from Last 3 Encounters:   11/30/22 84.2 kg (185 lb 10 oz)   11/11/22 90.1 kg (198 lb 10.2 oz)   10/31/22 84.8 kg (187 lb)     Lab Results   Component Value Date    BILITOT 0.4 12/04/2022    ALBUMIN 2.8 (L) 12/04/2022    ALT 8 (L) 12/04/2022    AST 8 12/04/2022       Pharmacokinetic Considerations and Significant Drug Interactions:  Concurrent hepatotoxic medications: None identified  Concurrent QTc-prolonging medications:  azithromycin 250 mg daily  Concurrent CYP3A4 substrates/inhibitors: None identified    Assessment/Plan:  Recommendation(s)  Continue current regimen of posaconazole DR 200 mg PO daily, is close to goal & had previously been supratherapeutic on this dose.     Follow-up  Next level should be ordered on ~9/25 at 0800 .   A pharmacist will continue to monitor and recommend levels as appropriate    Please page service pharmacist with questions/clarifications.    Asencion Partridge, PharmD, RPh pharmacist with questions/clarifications.    Asencion Partridge, PharmD, {Blank single:[id number]::RPh,PharmD,PharmD Candidate}

## 2022-12-06 NOTE — Unmapped (Signed)
Patient on [facility] 6LPM.  No distress noted.

## 2022-12-06 NOTE — Unmapped (Signed)
Infectious Disease ([facility]) Progress Note    Assessment & Plan:   Karen Barber is a 57 y.o. female whose presentation is complicated by bronchiectasis type 1 in the setting of pulmonary sarcoidosis (6L [place] home O2), HTN, T2DM, COPD, s/p LUL lobectomy, and asthma that presented to [facility] admitted to MICU after becoming hypoxic after holding home meds prior to PET scan. After 1 week ICU stay back to home O2 requirements after diuresing.    Principal Problem:    Acute on chronic hypoxic respiratory failure (CMS-HCC)  Active Problems:    Pulmonary sarcoidosis (CMS-HCC)    Type 2 diabetes mellitus with hyperglycemia (CMS-HCC)    COPD (chronic obstructive pulmonary disease) (CMS-HCC)    Chronic pulmonary aspergillosis (CMS-HCC)    Pulmonary hypertension (CMS-HCC)    AKI (acute kidney injury) (CMS-HCC)        Active Problems  Acute on Chronic Hypoxemic Respiratory Failure - Pulmonary Sarcoidosis - COPD  History of pulmonary sarcoidosis, COPD, pulmonary hypertension(baseline home O2 6 L).  Presented hypoxic to the 70s SpO2 on 6 L after going for outpatient PET CT scan in setting of holding home medications including Lasix and nebulizers.  Week long stay in the MICU and weaned from HFNC to [facility] after IV diuresis and course of antibiotics for possible pneumonia.  Showing significant signs of improvement but still having desaturations with ambulation.  As she had significant desaturations after holding home medications, will require discussion with pulmonology requirements prior to PET/CT scan as if she needs to have these medications held she will likely require inpatient PET/CT for close monitoring.  --Consult pulmonology, appreciate recommendations  -- Continue home airway clearance (Aerobika and HTS neb)  -- Continue home Pulmicort, Brovana, Atrovent, as needed albuterol  -- s/p vanc/Cefe -> Levofloxacin 1 wk total course abx  -- Wean O2 as tolerated goal SpO2 greater than 90%  -- Consider AVAPS at home if able to get set up coordinated  -- 6-minute walk test after PET/CT  -- Daily Azithro  -- PET/CT will need to be NPO prior   -- Discuss with pulmonology if pHTN, COPD and sarcoidosis meds need to be held prior to PET/CT and if needed to be held, will have to happen inpatient    Pulmonary hypertension - RV failure - orthostatic hypotension  History of pulmonary hypertension due to pulmonary sarcoidosis/COPD.  TTE this admission 9/17 with severely dilated right atrium with severe pulmonary hypertension PASP 86 mmHg.  Estimated right atrial pressure 5 to 10 mmHg.  On Lasix 80 mg twice daily at home.    -- Decrease Lasix 40mg  IV bid -> likely switch to home dose PO Lasix 80mg  bid tomorrow  -- Continue home Sildenafil 40mg  tid  -- trend BNP  -- strict I/Os  -- Daily weights    Type 2 diabetes mellitus with hyperglycemia and hypoglycemia  Hemoglobin A1c 8.3%.  Home regimen includes glargine 18 units nightly and sliding scale insulin.  Large fluctuations in glucose greater than 300 and occasional values ~50.  With high sensitivity, we will keep higher glucose goals.  -- Insulin NPH 8 units twice daily  -- Sliding scale insulin insulin sensitivity factor 40    Chronic Problems    Chronic Pain   - Continuing home gabapentin   - PT/OT consulted, appreciate recs    CKD3  - Renally dose medications  - Daily BMP    Chronic pulmonary aspergillosis  -Continue home posaconazole Ppx    Chronic normocytic anemia (baseline hemoglobin 11)  -  Daily CBC    Issues Impacting Complexity of Management:  -Intensive monitoring of drug toxicity from Lasix with scheduled BMP  -The patient is at high risk from Hospital immobility in an elderly patient given baseline poor functional status with a high risk of causing delirium and further decline in function  -Need for the following intensive monitoring parameter(s) due to high risk of clinical decline: continuous oxygen monitoring and telemetry  -Need for intensive oxygen therapy of LBNC, which places the patient at high risk for oxygen toxicity        Daily Checklist:  Diet: Regular Diet  DVT PPx: Lovenox 40mg  q24h  Electrolytes: Replete Potassium to >/= 3.6 and Magnesium to >/= 1.8  Code Status: DNR and DNI  Dispo: Transfer to MPCU    Team Contact Information:   Primary Team: Infectious Disease ([facility])  Primary Resident: Junius Roads, MD, MD  Resident's Pager: [phone] Evelene Croon Disease Intern - Tower)    Interval History:   Patient seen and examined at bedside.  States she is feeling much better since being diuresed in the MICU.  Still feels short of breath when she gets up and walks around.  Per nursing, having episodes of soft blood pressures when she gets up and walks around after sitting in her chair.    Objective:   Temp:  [36.3 ??C (97.3 ??F)-36.7 ??C (98.1 ??F)] 36.7 ??C (98.1 ??F)  Heart Rate:  [72-100] 78  SpO2 Pulse:  [74-103] 78  Resp:  [15-44] 27  BP: (82-120)/(59-84) 85/68  SpO2:  [86 %-100 %] 99 %    Gen: NAD, converses, on 8L LBNC   HENT: atraumatic, normocephalic  Heart: RRR  Lungs: CTAB, no crackles or wheezes  Abdomen: soft, NTND  Extremities: trace pitting edema LLE

## 2022-12-06 NOTE — Unmapped (Addendum)
Outpatient Provider Follow-Up Issues:  []  Respiratory Failure: Adjust home oxygen requirements.  []  Pulmonary Hypertension: Tyvaso therapy and follow up with pulmonology.  []  CKD/AKI: Monitor renal function and adjust medications.  []  Hyperparathyroidism: Will need follow-up labs around week of 10/27 and adjustment in supplements based on results.  []  Diabetes: Continue Glargine 12 units nightly and SSI insulin with meals and monitor glucose.  []  Aspergillosis: Continue posaconazole; follow up with ID as scheduled.  []  Anemia: Monitor hemoglobin and continue iron supplementation.  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 She was admitted to the MICU after becoming hypoxic following a PET scan. After stabilization, she was transferred to the [facility]U.     Acute on Chronic Hypoxemic Respiratory Failure - Pulmonary Hypertension  The patient presented with acute hypoxemia after holding her home medications prior to a PET scan. CXR showed extensive pulmonary fibrosis. PET scan on 9/18 was not indicative of active sarcoidosis. RHC on 9/20 revealed significant pulmonary hypertension with a mean PA pressure of 47 mmHg. She was started on Iloprost. She is now stable on 7-8 L , which may represent her new baseline oxygen requirement. Patient to continue home Pulmicort, Brovana, Atrovent, prn Albuterol, and Azithromycin upon discharge. She is discharging on Tyvaso nebulizer therapy and will follow up with pulmonology.    After recent admission with pneumonia growing Serratia marcescens. Patient held her morning medications prior to outpatient PET CT scan during which she became hypoxic to the 70s and admitted to MICU.  CXR consistent with extensive pulmonary fibrosis. She was weaned from high flow nasal cannula to large bore nasal cannula after IV diuresis and antibiotics. PET scan 9/18 is not indicative of active pulmonary sarcoidosis. RHC 9/20 showing significant pulm HTN disease with mean PA pressure of 47, for which pulmonology team started Iloprost. Notably, wedge pressure on that study was 5, which is reassuring against volume as driver of her respiratory status at that time. Patient has been having variations in oxygen requirement.  Received IV diuresis followed by transition to oral diuresis with 120 mg Lasix BID.    Ongoing GOC conversations with patient throughout admission by [place] and by pulmonology. Referral placed for home palliative care upon discharge. On day of discharge, confirmed patient's desire to be DNR/DNI. She initially stated she would want CPR but not intubation. I explained that it is highly unlikely that she would be in a situation where CPR alone would be required and that she would recover. Upon further discussion, patient states she would not want to be resuscitated or intubated.    Acute Kidney Injury on CKD3  Patient's baseline creatinine seems roughly around 1.3-1.5.The patient had an AKI on admission with creatinine of 1.8. She was followed by nephrology and required IV diuresis initially. She was transitioned to oral diuresis with 120 mg PO Lasix BID. Renal function has continued to worsen throughout admission, with a current creatinine of 2.26. Permissive hypercreatinemia due to narrow euvolemia window with respiratory status. She will need close monitoring of renal function as an outpatient.    Secondary Hyperparathyroidism, Hypocalcemia, and Hyperphosphatemia  The patient has secondary hyperparathyroidism due to CKD. PTH of 224.7 during admission. She will need repeat labs the week of 10/27.    Type 2 Diabetes Mellitus with Hyperglycemia and Hypoglycemia  The patient has poorly controlled  diabetes with significant fluctuations in glucose levels. She will continue Lantus 12 units nightly and SSI with meals on discharge, with close monitoring of blood sugars.    Chronic Pulmonary Aspergillosis  The patient has a history of chronic aspergillosis and is on posaconazole prophylaxis which was continued during admission.    Iron Deficiency Anemia  The patient has chronic normocytic anemia with a baseline hemoglobin of 11. She received iron supplementation during this admission and will need outpatient follow-up to re-evaluate further iron needs.    Left leg swelling  Patient endorsed intermittent left > right leg swelling. Venous doppler unremarkable.     Follow-Up Appointments:  Pulmonology: 01/31/23 with Dr. Ala Dach  Nephrology: Nephrology to call patient to schedule appointment in [facility]  Endocrinology: TBD  Primary Care Provider: 01/16/23 with Karen Buffy, NP

## 2022-12-06 NOTE — Unmapped (Signed)
Infectious Disease ([facility]) Progress Note    Assessment & Plan:   Karen Barber is a 57 y.o. female whose presentation is complicated by bronchiectasis type 1 in the setting of pulmonary sarcoidosis (6L [place] home O2), HTN, T2DM, COPD, s/p LUL lobectomy, and asthma that presented to [facility] admitted to MICU after becoming hypoxic after holding home meds prior to PET scan. After 1 week after diuresing ICU stabilized patient she was transferred to the floor.    Principal Problem:    Acute on chronic hypoxic respiratory failure (CMS-HCC)  Active Problems:    Pulmonary sarcoidosis (CMS-HCC)    Type 2 diabetes mellitus with hyperglycemia (CMS-HCC)    COPD (chronic obstructive pulmonary disease) (CMS-HCC)    Chronic pulmonary aspergillosis (CMS-HCC)    Pulmonary hypertension (CMS-HCC)    AKI (acute kidney injury) (CMS-HCC)    Active Problems  Acute on Chronic Hypoxemic Respiratory Failure - Pulmonary Sarcoidosis - COPD  Patient has notable history of pulmonary sarcoidosis, COPD, pulmonary hypertension and is on baseline oxygen 6 L.  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  Recent admission with pneumonia growing Serratia marcescens  s/p 1x Rocephin (8/18), Vancomycin (8/18-8/19), Zosyn (8/18-8/23) discharged on levofloxacin 500 mg ending on 8/31.  Patient held her medications only for the morning prior to an outpatient PET CT scan during which she became hypoxic to the 70s and was admitted to the MICU.  Patient had a week long stay where she was weaned from high flow nasal cannula to large bore nasal cannula after IV diuresis and a course of antibiotics for possible pneumonia with Vanco/cefepime later transition to levofloxacin with a total 1 week treatment with antibiotics. Started on vanc/cefepime for broad coverage --> stopped vanc and cefepime 9/12- then levaquin (9/12---9/16).  Patient was transferred to the floor on 9/18 after showing significant signs of improvement but still having occasional desaturations with ambulation.  -- Consult pulmonology, appreciate recommendations  -- Continue home airway clearance (Aerobika and HTS neb)  -- Continue home Pulmicort, Brovana, Atrovent, as needed albuterol  -- Wean O2 as tolerated goal SpO2 greater than 90%  -- 6-minute walk test after PET/CT  -- Daily Azithro for anti-inflammatory effect   -- PET     Pulmonary hypertension - RV failure - orthostatic hypotension  Patient has history of pulmonary hypertension due to pulmonary sarcoidosis/COPD.  TTE this admission 9/17 with severely dilated right atrium with severe pulmonary hypertension PASP 86 mmHg.  Estimated right atrial pressure 5 to 10 mmHg.  On Lasix 80 mg twice daily at home.    -- Continue Lasix 40 mg IV twice daily  --Follow-up right heart cath  --Follow-up PET scan,ECHO  -- Continue home Sildenafil 40mg  tid  -- trend BNP  -- strict I/Os  -- Daily weights     Type 2 diabetes mellitus with hyperglycemia and hypoglycemia  Hemoglobin A1c 8.3%.  Home regimen includes glargine 18 units nightly and sliding scale insulin.  Large fluctuations in glucose greater than 300 and occasional values ~50.    -- Insulin NPH 8 units twice daily  -- Sliding scale insulin insulin sensitivity factor 40    Hypocalcemia  Ionized calcium 4.37  --Supplementation    Left leg swelling  Patient endorses intermittent left greater than right leg swelling.  --Follow-up PVL     Chronic Problems     Chronic Pain   - Continuing home gabapentin   - PT/OT consulted, appreciate recs     CKD3  Creatinine 1.31  which appears to be baseline.  - Renally dose medications  - Daily BMP     Chronic pulmonary aspergillosis  -Continue home posaconazole Ppx     Iron deficiency anemia  Chronic normocytic anemia (baseline hemoglobin 11)  Patient has some mild normocytic anemia with hemoglobin of 11.  Iron panel shows iron of 37 with iron saturation of 15.  --Defer iron supplementation at the time given infection  --Daily CBC    Hyperlipidemia  Last lipid profile showed an LDL of 146  -- Follow-up outpatient    Issues Impacting Complexity of Management:  -The patient is at high risk for the development of complications of volume overload due to the need to provide IV hydration for suspected hypovolemia in the setting of: Pulmonary hypertension  -Need for the following intensive monitoring parameter(s) due to high risk of clinical decline: continuous oxygen monitoring, telemetry, and frequent monitoring of urine output to assess for the efficacy of diuretic regimen  -Need for intensive oxygen therapy of HFNC, which places the patient at high risk for oxygen toxicity  -The patient is at high risk of complications from pulmonary hypertension    Daily Checklist:  Diet: Regular Diet  DVT PPx: Lovenox 40mg  q24h  Electrolytes: Replete Potassium to >/= 3.6 and Magnesium to >/= 1.8  Code Status: DNR and DNI  Dispo:  Continue on floor    Team Contact Information:   Primary Team: Infectious Disease ([facility])  Primary Resident: Conception Chancy, MD,  Resident's Pager: [phone] (Infect Disease Intern - Tower)

## 2022-12-06 NOTE — Unmapped (Signed)
Pt A&Ox4, VSS, O2 sats >88% on 7L LBNC. Afebrile. UOP adequate. No BM this shift. Appetite adequate. Pt went for PET today pending results. No c/o pain or discomfort. No falls/injuries this shift. All alarms audible, call bell within reach, see flowsheets/MAR for more info.     Problem: Adult Inpatient Plan of Care  Goal: Plan of Care Review  Outcome: Progressing  Goal: Patient-Specific Goal (Individualized)  Outcome: Progressing  Goal: Absence of Hospital-Acquired Illness or Injury  Outcome: Progressing  Intervention: Identify and Manage Fall Risk  Recent Flowsheet Documentation  Taken 12/06/2022 0800 by Coralyn Pear, RN  Safety Interventions:   aspiration precautions   bleeding precautions   commode/urinal/bedpan at bedside   environmental modification   fall reduction program maintained   infection management   lighting adjusted for tasks/safety   low bed  Intervention: Prevent Skin Injury  Recent Flowsheet Documentation  Taken 12/06/2022 1200 by Coralyn Pear, RN  Positioning for Skin: Supine/Back  Taken 12/06/2022 1000 by Coralyn Pear, RN  Positioning for Skin: Supine/Back  Taken 12/06/2022 0800 by Coralyn Pear, RN  Positioning for Skin: (sitting up in bed) Other (Comment)  Device Skin Pressure Protection:   absorbent pad utilized/changed   adhesive use limited  Skin Protection:   adhesive use limited   incontinence pads utilized   tubing/devices free from skin contact  Intervention: Prevent and Manage VTE (Venous Thromboembolism) Risk  Recent Flowsheet Documentation  Taken 12/06/2022 0800 by Coralyn Pear, RN  VTE Prevention/Management:   bleeding precautions maintained   bleeding risk factors identified   dorsiflexion/plantar flexion performed   fluids promoted  Intervention: Prevent Infection  Recent Flowsheet Documentation  Taken 12/06/2022 0800 by Coralyn Pear, RN  Infection Prevention:   environmental surveillance performed   equipment surfaces disinfected   hand hygiene promoted   personal protective equipment utilized   rest/sleep promoted  Goal: Optimal Comfort and Wellbeing  Outcome: Progressing  Goal: Readiness for Transition of Care  Outcome: Progressing  Goal: Rounds/Family Conference  Outcome: Progressing     Problem: Fall Injury Risk  Goal: Absence of Fall and Fall-Related Injury  Outcome: Progressing  Intervention: Identify and Manage Contributors  Recent Flowsheet Documentation  Taken 12/06/2022 0800 by Coralyn Pear, RN  Self-Care Promotion:   independence encouraged   BADL personal objects within reach   BADL personal routines maintained  Intervention: Promote Injury-Free Environment  Recent Flowsheet Documentation  Taken 12/06/2022 0800 by Coralyn Pear, RN  Safety Interventions:   aspiration precautions   bleeding precautions   commode/urinal/bedpan at bedside   environmental modification   fall reduction program maintained   infection management   lighting adjusted for tasks/safety   low bed     Problem: Gas Exchange Impaired  Goal: Optimal Gas Exchange  Outcome: Progressing  Intervention: Optimize Oxygenation and Ventilation  Recent Flowsheet Documentation  Taken 12/06/2022 1200 by Coralyn Pear, RN  Head of Bed [facility]) Positioning: HOB at 30-45 degrees  Taken 12/06/2022 1000 by Coralyn Pear, RN  Head of Bed [facility]) Positioning: HOB at 30-45 degrees  Taken 12/06/2022 0800 by Coralyn Pear, RN  Head of Bed [facility]) Positioning: HOB at 30-45 degrees  Taken 12/06/2022 0755 by Coralyn Pear, RN  Head of Bed [facility]) Positioning: HOB at 30-45 degrees

## 2022-12-06 NOTE — Unmapped (Signed)
Pulmonary Consult Service  Consult H&P      Primary Service:   Medicine  Primary Service Attending:  Leveda Anna, MD  Reason for Consult:   Assistance with diuresis    ASSESSMENT and PLAN     Karen Barber is a 57 y.o. 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 Pt was found to have acute on chronic respiratory failure (sat in 70s on home 6L) in the setting of not taking her home medications (sildenafil, lasix, nebs, inhalers) in preparation for PET scan, admitted 9/11. She was admitted to the ICU initially, has since been weaned back to 8L [place] with airway clearance and diuresis.     I think that her acute on chronic hypoxemia and increase O2 requirement was multifactorial -- combination of volume overload as well as lack of airway clearance at home. She also likely has higher O2 requirement chronically than the amount she has been using at home.     I am worried that 1) she is not on the appropriate dose of lasix to remain euvolemic, 2) that she is not able to/has not been doing her airway clearance and directed at home (says she is doing albuterol and aerobika, twice daily, no HTS)    Acute on Chronic Hypoxemic Respiratory Failure  COPD/Asthma  Pulmonary Sarcoidosis  Bronchiectasis s/s sarcoid  PET/CT scan was ordered prior to admission to evaluate for ongoing pulmonary sarcoidosis activity and for consideration of steroid sparing agent. Soluble IL-2 receptor, Aspergillus Galactomannan Antigen, IgE checked 10/2022 all wnl.   - Pt inhalers/lasix do NOT need to be held for PET/CT  - continue home pulmicort, brovana, PRN albuterol, azithromycin  - will follow up on results of PET/CT  - repeat  - please cont airway clearance:   - duonebs and HTS (3%) TID with aerobika    RV Failure  PAH (Group 3, 5)  TTE 9/17 showed severe pulmonary HTN (PASP ), severely dilated RV with mildly reduced systolic function, moderately dilated RA. Likely multifactorial in the setting of known COPD, pulmonary sarcoidosis. Pro-BNP elevated to 3,800, worsening today. Since admission cumulatively -10L, wts have been unreliable.   - Obtain weight today, and daily weight with strict I/O  - cont daily pro-BNP  - will likely need to repeat RHC (we will arrange)   - cont IV diuresis, likely will need to increase home dose     Chronic Pulmonary Aspergillosis   Aspergillus Galactomannan Antigen, IgE checked 10/2022 all wnl.   - continue home posaconazole   - pt has outpt evaluation for [facility] scheduled for 10/25 for history of recurrent hemoptysis    Karen Barber was seen, examined and discussed with  Dr. Ala Dach  Thank you for involving [place] in her care. We look forward to following with you.  Recommendations were communicated to the primary team at 10:14 AM  Please page [place] with any questions or concerns at [phone] (pulmonary consult fellow).    HISTORY:     History of Present Illness:  Karen Barber is a 57 y.o. female with hx pf pulmonary sarcoidosis, COPD, PAH (baseline home O2 6L), chronic pulmonary aspergillosis, s/p LUL lobectomy 2016 who initially presented to the hospital for a PET scan to evaluate her sarcoidosis.     Pt was found  to have acute on chronic respiratory failure (sat in 70s on home 6L) in the setting of not taking her home medications (sildenafil, lasix, nebs, inhalers) in preparation for PET scan. As her respiratory status did not improve with NRB, placed on HFNC. 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 She received one dose of 125mg  of IV methylprednisolone and then was transferred to the MICU for further management of her hypoxia. Pt had a week long stay in the MICU and was weaned from HFNC to [facility] after IV diuresis and course of antibiotics (completed course of levaquin) for possible pneumonia. Pt has been continuing home airway clearance (Aerobika and HTS neb), and home pulmicort, brovana, atrovent, and PRN albuterol. Showing significant signs of improvement but still having desaturations with ambulation.     Of note, has been hospitalized twice prior to this in the past 6 weeks.  8/18-8/25: presented with increased O2 requirement, increased swelling, brown sputum production.   - treated for serratia pneumonia (completed levaquin)  - Was discharged on AVAPS for home, as well as with HTS/albuterol and [place].  10/21/22-10/25/22: presented with DOE, up to 8-10L with exertion and desats to 70%.   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 A.niger from sputum 08/2016-09/2016.    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    Sarcoidosis 1995    Type 2 diabetes mellitus with hyperglycemia (CMS-HCC) 07/27/2014    Visual impairment     glasses     Past Surgical History:   Procedure Laterality Date    LUNG LOBECTOMY      PR AMPUTATION TOE,MT-P JT Right 12/12/2019    Procedure: Right foot fifth toe amputation at the metatarsophalangeal joint level;  Surgeon: Britt Bottom, DPM;  Location: MAIN OR [facility];  Service: Vascular    PR AMPUTATION TOE,MT-P JT Right 06/16/2020    Procedure: Right foot amputation of 3rd toe at mpj level;  Surgeon: Britt Bottom, DPM;  Location: MAIN OR [facility];  Service: Vascular    PR RIGHT HEART CATH O2 SATURATION & CARDIAC OUTPUT N/A 04/29/2019    Procedure: Right Heart Catheterization;  Surgeon: Rosana Hoes, MD;  Location: [facility] CATH;  Service: Cardiology    PR THORACOSCOPY SURG TOT PULM DECORT Left 12/14/2014    Procedure: THORACOSCOPY SURG; W/TOT PULM DECORTIC/PNEUMOLYS;  Surgeon: Evert Kohl, MD;  Location: MAIN OR [facility];  Service: Cardiothoracic    PR THORACOSCOPY W/THERA WEDGE RESEXN INITIAL UNILAT Left 12/03/2014    Procedure: THORACOSCOPY, SURGICAL; WITH THERAPEUTIC WEDGE RESECTION (EG, MASS, NODULE) INITIAL UNILATERAL;  Surgeon: Evert Kohl, MD;  Location: MAIN OR [facility];  Service: Cardiothoracic  Other History:  Family History   Problem Relation Age of Onset    Dementia Mother     Diabetes Father     Diabetes Brother     Diabetes Maternal Aunt     Sarcoidosis Maternal Aunt     Diabetes Maternal Uncle     Diabetes Paternal Aunt     Diabetes Paternal Uncle      Social History     Socioeconomic History    Marital status: Single   Tobacco Use    Smoking status: Former     Current packs/day: 0.00     Average packs/day: 0.5 packs/day for 9.0 years (4.5 ttl pk-yrs)     Types: Cigarettes     Start date: 05/02/1978     Quit date: 05/03/1987     Years since quitting: 35.6    Smokeless tobacco: Never   Vaping Use    Vaping status: Never Used   Substance and Sexual Activity    Alcohol use: No     Alcohol/week: 0.0 standard drinks of alcohol    Drug use: No    Sexual activity: Yes   Other Topics Concern    Do you use sunscreen? No    Tanning bed use? No    Are you easily burned? No    Excessive sun exposure? No    Blistering sunburns? No     Social Determinants of Health     Financial Resource Strain: Low Risk  (11/06/2022)    Overall Financial Resource Strain (CARDIA)     Difficulty of Paying Living Expenses: Not hard at all   Food Insecurity: No Food Insecurity (11/06/2022)    Hunger Vital Sign     Worried About Running Out of Food in the Last Year: Never true     Ran Out of Food in the Last Year: Never true   Transportation Needs: No Transportation Needs (11/06/2022)    PRAPARE - Therapist, art (Medical): No     Lack of Transportation (Non-Medical): No       Home Medications:  No current facility-administered medications on file prior to encounter.     Current Outpatient Medications on File Prior to Encounter   Medication Sig Dispense Refill    ACCU-CHEK GUIDE GLUCOSE METER Misc 1 Device by Other route Four (4) times a day (before meals and nightly). AS DIRECTED 1 kit 0    albuterol 2.5 mg /3 mL (0.083 %) nebulizer solution Inhale 3 mL (2.5 mg total) by nebulization every six (6) hours as needed for wheezing or shortness of breath (airway clearance/cough). 360 mL 3    albuterol HFA 90 mcg/actuation inhaler Inhale 2 puffs every four (4) hours as needed for wheezing or shortness of breath. 54 g 0    arformoterol (BROVANA) 15 mcg/2 mL Inhale 2 mL (15 mcg total) by nebulization in the morning and 2 mL (15 mcg total) in the evening. 120 mL 2    aspirin-caffeine (BAYER BACK AND BODY) 500-32.5 mg Tab Take by mouth. Takes 2 1/2 pills 3-4 times a day as needed.      azithromycin (ZITHROMAX) 250 MG tablet Take 1 tablet (250 mg total) by mouth daily. Start on 9/1 after completing the levofloxacin 90 tablet 0    blood sugar diagnostic Strp by Other route Three (3) times a day. ACCU-CHEK Guide meter 300 strip 1    budesonide (PULMICORT) 0.5 mg/2 mL nebulizer solution Inhale 2 mL (0.5 mg total) by nebulization in  the morning and 2 mL (0.5 mg total) in the evening. 120 mL 2    furosemide (LASIX) 80 MG tablet Take 1 tablet (80 mg total) by mouth two (2) times a day. Take a third dose as needed for increasing weight or increasing swelling. 60 tablet 0    gabapentin (NEURONTIN) 300 MG capsule Take 1 capsule (300 mg total) by mouth Three (3) times a day. 270 capsule 3    insulin aspart (NOVOLOG U-100 INSULIN ASPART) 100 unit/mL vial Inject 0.01-0.2 mL (1-20 Units total) under the skin Four (4) times a day (before meals and nightly). SLIDING SCALE 20 mL 1    insulin glargine (BASAGLAR KWIKPEN U-100 INSULIN) 100 unit/mL (3 mL) injection pen Inject 0.18 mL (18 Units total) under the skin nightly. 15 mL 0    ipratropium (ATROVENT) 0.02 % nebulizer solution Inhale 2.5 mL (500 mcg total) by nebulization four (4) times a day. 300 mL 0    posaconazole (NOXAFIL) 100 mg delayed released tablet Take 2 tablets by mouth once daily 60 tablet 0    sildenafiL, pulm.hypertension, (REVATIO) 20 mg tablet Take 1 tablet (20 mg total) by mouth Three (3) times a day. 90 tablet 0    OXYGEN-AIR DELIVERY SYSTEMS MISC Dose: 2-3 LPM         In-Hospital Medications:  Scheduled:   arformoterol  15 mcg Nebulization BID (RT)    azithromycin  250 mg Oral Daily    budesonide  0.5 mg Nebulization BID (RT)    enoxaparin (LOVENOX) injection  40 mg Subcutaneous Nightly    furosemide  40 mg Intravenous BID    gabapentin  300 mg Oral TID    insulin lispro  0-20 Units Subcutaneous ACHS    insulin NPH  8 Units Subcutaneous Q12H SCH    ipratropium  500 mcg Nebulization BID (RT)    magnesium oxide-Mg AA chelate  2 tablet Oral BID    polyethylene glycol  17 g Oral Daily    posaconazole  200 mg Oral Daily    sildenafiL (pulm.hypertension)  40 mg Oral TID    sodium chloride  4 mL Nebulization BID (RT)     Continuous Infusions:    PRN Medications:  acetaminophen, albuterol, calcium carbonate, dextrose in water, glucagon, ondansetron **OR** ondansetron, senna, sodium chloride    Allergies:  Allergies as of 11/29/2022    (No Known Allergies)         PHYSICAL EXAM:   BP 85/68  - Pulse 76  - Temp 36.3 ??C (97.4 ??F) (Oral)  - Resp 26  - Ht 180.3 cm (5' 11)  - Wt 84.2 kg (185 lb 10 oz)  - LMP  (LMP Unknown)  - SpO2 98%  - BMI 25.89 kg/m??   General: Alert, well-appearing, and in no distress.  Eyes: Anicteric sclera, conjunctiva clear.  ENT:  Nares normal, septum midline, mucosa normal, no drainage or sinus tenderness.  Lungs: Diminished breath sounds. Diffuse coarse crackles bilaterally, worse in the bases.  Cardiovascular: Regular rate and rhythm., not able to appreciate elevated JVP, negative HJR  Musculoskeletal: No clubbing and no synovitis.    Skin: No rashes or lesions., Mild swelling, L>R  Neuro: No focal neurological deficits.     LABORATORY and RADIOLOGY DATA:     Pertinent Laboratory Data:  Lab Results   Component Value Date    WBC 6.8 12/06/2022    HGB 11.0 (L) 12/06/2022    HCT 33.0 (L) 12/06/2022    PLT 248 12/06/2022  Lab Results   Component Value Date    NA 139 12/05/2022    NA 137 12/05/2022    K 4.1 12/05/2022    K 4.2 12/05/2022    CL 98 12/05/2022    CO2 30.0 12/05/2022    BUN 41 (H) 12/05/2022    CREATININE 1.31 (H) 12/05/2022    GLU 345 (H) 12/05/2022    CALCIUM 8.7 12/05/2022    MG 1.8 12/06/2022    PHOS 4.1 12/06/2022       Lab Results   Component Value Date    BILITOT 0.4 12/04/2022    BILIDIR <0.10 05/30/2019    PROT 6.1 12/04/2022    ALBUMIN 2.8 (L) 12/04/2022    ALT 8 (L) 12/04/2022    AST 8 12/04/2022    ALKPHOS 63 12/04/2022    GGT 60 (H) 05/31/2011       Lab Results   Component Value Date    INR 1.02 12/11/2019    APTT 30.9 12/11/2019       Pertinent Micro Data:  Microbiology Results (last day)       ** No results found for the last 24 hours. **             Pertinent Imaging Data:  9/11 CXR   - Extensive fibrotic changes and biapical bulla appear grossly unchanged. Extensive pulmonary fibrosis limits evaluation for superimposed infection.

## 2022-12-07 LAB — CBC
HEMATOCRIT: 34.4 % (ref 34.0–44.0)
HEMOGLOBIN: 11.2 g/dL — ABNORMAL LOW (ref 11.3–14.9)
MEAN CORPUSCULAR HEMOGLOBIN CONC: 32.6 g/dL (ref 32.0–36.0)
MEAN CORPUSCULAR HEMOGLOBIN: 26.6 pg (ref 25.9–32.4)
MEAN CORPUSCULAR VOLUME: 81.6 fL (ref 77.6–95.7)
MEAN PLATELET VOLUME: 7.9 fL (ref 6.8–10.7)
PLATELET COUNT: 205 10*9/L (ref 150–450)
RED BLOOD CELL COUNT: 4.22 10*12/L (ref 3.95–5.13)
RED CELL DISTRIBUTION WIDTH: 17.4 % — ABNORMAL HIGH (ref 12.2–15.2)
WBC ADJUSTED: 5.5 10*9/L (ref 3.6–11.2)

## 2022-12-07 LAB — BASIC METABOLIC PANEL
ANION GAP: 2 mmol/L — ABNORMAL LOW (ref 5–14)
BLOOD UREA NITROGEN: 44 mg/dL — ABNORMAL HIGH (ref 9–23)
BUN / CREAT RATIO: 27
CALCIUM: 9.5 mg/dL (ref 8.7–10.4)
CHLORIDE: 99 mmol/L (ref 98–107)
CO2: 38 mmol/L — ABNORMAL HIGH (ref 20.0–31.0)
CREATININE: 1.62 mg/dL — ABNORMAL HIGH
EGFR CKD-EPI (2021) FEMALE: 36 mL/min/{1.73_m2} — ABNORMAL LOW (ref >=60)
GLUCOSE RANDOM: 138 mg/dL (ref 70–179)
POTASSIUM: 4.3 mmol/L (ref 3.4–4.8)
SODIUM: 139 mmol/L (ref 135–145)

## 2022-12-07 LAB — PRO-BNP: PRO-BNP: 2900 pg/mL — ABNORMAL HIGH (ref <=300.0)

## 2022-12-07 LAB — PHOSPHORUS: PHOSPHORUS: 3.8 mg/dL (ref 2.4–5.1)

## 2022-12-07 LAB — MAGNESIUM: MAGNESIUM: 1.9 mg/dL (ref 1.6–2.6)

## 2022-12-07 MED ADMIN — sodium chloride 3 % NEBULIZER solution 4 mL: 4 mL | RESPIRATORY_TRACT

## 2022-12-07 MED ADMIN — insulin lispro (HumaLOG) injection 0-20 Units: 0-20 [IU] | SUBCUTANEOUS | @ 01:00:00

## 2022-12-07 MED ADMIN — azithromycin (ZITHROMAX) tablet 250 mg: 250 mg | ORAL | @ 14:00:00 | Stop: 2025-08-25

## 2022-12-07 MED ADMIN — albuterol 2.5 mg /3 mL (0.083 %) nebulizer solution 2.5 mg: 2.5 mg | RESPIRATORY_TRACT

## 2022-12-07 MED ADMIN — magnesium oxide-Mg AA chelate (Magnesium Plus Protein) 2 tablet: 2 | ORAL | @ 01:00:00

## 2022-12-07 MED ADMIN — budesonide (PULMICORT) nebulizer solution 0.5 mg: .5 mg | RESPIRATORY_TRACT

## 2022-12-07 MED ADMIN — arformoterol (BROVANA) nebulizer solution 15 mcg/2 mL: 15 ug | RESPIRATORY_TRACT | @ 15:00:00

## 2022-12-07 MED ADMIN — insulin NPH (HumuLIN,NovoLIN) injection 8 Units: 8 [IU] | SUBCUTANEOUS | @ 02:00:00

## 2022-12-07 MED ADMIN — gabapentin (NEURONTIN) capsule 300 mg: 300 mg | ORAL | @ 14:00:00

## 2022-12-07 MED ADMIN — magnesium oxide-Mg AA chelate (Magnesium Plus Protein) 2 tablet: 2 | ORAL | @ 14:00:00

## 2022-12-07 MED ADMIN — sodium chloride 3 % NEBULIZER solution 4 mL: 4 mL | RESPIRATORY_TRACT | @ 15:00:00

## 2022-12-07 MED ADMIN — posaconazole (NOXAFIL) delayed released tablet 200 mg: 200 mg | ORAL | @ 14:00:00

## 2022-12-07 MED ADMIN — arformoterol (BROVANA) nebulizer solution 15 mcg/2 mL: 15 ug | RESPIRATORY_TRACT | @ 01:00:00

## 2022-12-07 MED ADMIN — insulin lispro (HumaLOG) injection 0-20 Units: 0-20 [IU] | SUBCUTANEOUS | @ 17:00:00

## 2022-12-07 MED ADMIN — gabapentin (NEURONTIN) capsule 300 mg: 300 mg | ORAL | @ 01:00:00

## 2022-12-07 MED ADMIN — gabapentin (NEURONTIN) capsule 300 mg: 300 mg | ORAL | @ 18:00:00

## 2022-12-07 MED ADMIN — furosemide (LASIX) injection 40 mg: 40 mg | INTRAVENOUS | @ 18:00:00

## 2022-12-07 MED ADMIN — budesonide (PULMICORT) nebulizer solution 0.5 mg: .5 mg | RESPIRATORY_TRACT | @ 15:00:00

## 2022-12-07 MED ADMIN — budesonide (PULMICORT) nebulizer solution 0.5 mg: .5 mg | RESPIRATORY_TRACT | @ 01:00:00

## 2022-12-07 MED ADMIN — ipratropium (ATROVENT) 0.02 % nebulizer solution 500 mcg: 500 ug | RESPIRATORY_TRACT | @ 15:00:00 | Stop: 2022-12-07

## 2022-12-07 MED ADMIN — sildenafiL (pulm.hypertension) (REVATIO) tablet 40 mg: 40 mg | ORAL | @ 18:00:00

## 2022-12-07 MED ADMIN — enoxaparin (LOVENOX) syringe 40 mg: 40 mg | SUBCUTANEOUS | @ 01:00:00

## 2022-12-07 MED ADMIN — calcium carbonate (TUMS) chewable tablet 200 mg elem calcium: 200 mg | ORAL | @ 02:00:00

## 2022-12-07 MED ADMIN — furosemide (LASIX) injection 40 mg: 40 mg | INTRAVENOUS | @ 10:00:00 | Stop: 2022-12-07

## 2022-12-07 MED ADMIN — sodium chloride 3 % NEBULIZER solution 4 mL: 4 mL | RESPIRATORY_TRACT | @ 01:00:00

## 2022-12-07 MED ADMIN — arformoterol (BROVANA) nebulizer solution 15 mcg/2 mL: 15 ug | RESPIRATORY_TRACT

## 2022-12-07 MED ADMIN — sildenafiL (pulm.hypertension) (REVATIO) tablet 40 mg: 40 mg | ORAL | @ 01:00:00

## 2022-12-07 MED ADMIN — ipratropium (ATROVENT) 0.02 % nebulizer solution 500 mcg: 500 ug | RESPIRATORY_TRACT

## 2022-12-07 MED ADMIN — insulin NPH (HumuLIN,NovoLIN) injection 8 Units: 8 [IU] | SUBCUTANEOUS | @ 14:00:00

## 2022-12-07 MED ADMIN — ipratropium (ATROVENT) 0.02 % nebulizer solution 500 mcg: 500 ug | RESPIRATORY_TRACT | @ 01:00:00

## 2022-12-07 MED ADMIN — sildenafiL (pulm.hypertension) (REVATIO) tablet 40 mg: 40 mg | ORAL | @ 14:00:00

## 2022-12-07 NOTE — Unmapped (Addendum)
Pt A&Ox4, VSS, O2 sats>88% on 6L LBNC. Pt required 8L LBNC on exertion/ambulation to bathroom. Afebrile. UOP adequate. 1 BM this shift. Appetite adequate. NPO at midnight for vascular procedure tomorrow. No c/o pain. No falls/injuries this shift. All alarms audible, call bell within reach, see flowsheets/MAR for more info.     Problem: Adult Inpatient Plan of Care  Goal: Plan of Care Review  Outcome: Progressing  Goal: Patient-Specific Goal (Individualized)  Outcome: Progressing  Goal: Absence of Hospital-Acquired Illness or Injury  Outcome: Progressing  Intervention: Identify and Manage Fall Risk  Recent Flowsheet Documentation  Taken 12/07/2022 0800 by Coralyn Pear, RN  Safety Interventions:   fall reduction program maintained   lighting adjusted for tasks/safety   low bed  Intervention: Prevent Skin Injury  Recent Flowsheet Documentation  Taken 12/07/2022 1600 by Coralyn Pear, RN  Positioning for Skin: Supine/Back  Taken 12/07/2022 1400 by Coralyn Pear, RN  Positioning for Skin: (sitting up in bed) Other (Comment)  Taken 12/07/2022 1200 by Coralyn Pear, RN  Positioning for Skin: (Sitting up in bed) Other (Comment)  Device Skin Pressure Protection:   absorbent pad utilized/changed   adhesive use limited   tubing/devices free from skin contact  Skin Protection:   incontinence pads utilized   adhesive use limited   tubing/devices free from skin contact  Taken 12/07/2022 1000 by Coralyn Pear, RN  Positioning for Skin: Supine/Back  Taken 12/07/2022 0800 by Coralyn Pear, RN  Positioning for Skin: Supine/Back  Device Skin Pressure Protection:   absorbent pad utilized/changed   adhesive use limited   tubing/devices free from skin contact  Skin Protection:   incontinence pads utilized   adhesive use limited   tubing/devices free from skin contact  Intervention: Prevent and Manage VTE (Venous Thromboembolism) Risk  Recent Flowsheet Documentation  Taken 12/07/2022 1200 by Coralyn Pear, RN  VTE Prevention/Management:   ambulation promoted   bleeding precautions maintained   dorsiflexion/plantar flexion performed   fluids promoted  Taken 12/07/2022 0800 by Coralyn Pear, RN  VTE Prevention/Management:   ambulation promoted   bleeding precautions maintained   bleeding risk factors identified   dorsiflexion/plantar flexion performed   fluids promoted  Intervention: Prevent Infection  Recent Flowsheet Documentation  Taken 12/07/2022 0800 by Coralyn Pear, RN  Infection Prevention:   hand hygiene promoted   rest/sleep promoted   single patient room provided  Goal: Optimal Comfort and Wellbeing  Outcome: Progressing  Goal: Readiness for Transition of Care  Outcome: Progressing  Goal: Rounds/Family Conference  Outcome: Progressing     Problem: Fall Injury Risk  Goal: Absence of Fall and Fall-Related Injury  Outcome: Progressing  Intervention: Promote Injury-Free Environment  Recent Flowsheet Documentation  Taken 12/07/2022 0800 by Coralyn Pear, RN  Safety Interventions:   fall reduction program maintained   lighting adjusted for tasks/safety   low bed     Problem: Gas Exchange Impaired  Goal: Optimal Gas Exchange  Outcome: Progressing  Intervention: Optimize Oxygenation and Ventilation  Recent Flowsheet Documentation  Taken 12/07/2022 1600 by Coralyn Pear, RN  Head of Bed [facility]) Positioning: HOB at 30-45 degrees  Taken 12/07/2022 1400 by Coralyn Pear, RN  Head of Bed [facility]) Positioning: HOB at 30-45 degrees  Taken 12/07/2022 1200 by Coralyn Pear, RN  Head of Bed [facility]) Positioning: HOB at 30-45 degrees  Taken 12/07/2022 1000 by Coralyn Pear, RN  Head of Bed [facility]) Positioning: HOB at 30-45 degrees  Taken 12/07/2022 0800 by Coralyn Pear, RN  Head of Bed [facility]) Positioning: [facility]  at 30-45 degrees

## 2022-12-07 NOTE — Unmapped (Signed)
Pulmonary Consult Service  Consult Follow Up Note      Primary Service:   Medicine  Primary Service Attending:  Leveda Anna, MD  Reason for Consult:   Assistance with diuresis    ASSESSMENT and PLAN     Karen Barber is a 57 y.o. 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 Pt was found to have acute on chronic respiratory failure (sat in 70s on home 6L) in the setting of not taking her home medications (sildenafil, lasix, nebs, inhalers) in preparation for PET scan, admitted 9/11. She was admitted to the ICU initially, has since been weaned back to 7-9L [place] with airway clearance and diuresis.     Her acute on chronic hypoxemia and increase O2 requirement is likely multifactorial -- combination of volume overload as well as lack of airway clearance at home. She also likely has higher O2 requirement chronically than the amount she has been using at home.     Acute on Chronic Hypoxemic Respiratory Failure  COPD/Asthma  Pulmonary Sarcoidosis  Bronchiectasis s/s sarcoid  PET/CT scan was ordered prior to admission to evaluate for ongoing pulmonary sarcoidosis activity and for consideration of possible immunosuppressive agents to further treat. Soluble IL-2 receptor, Aspergillus Galactomannan Antigen, IgE checked 10/2022 all wnl. PET scan 9/17 does not appear to be active pulmonary sarcoidosis and again it is overall felt to be burned out from sarcoidosis standpoint.   - continue home pulmicort, brovana, PRN albuterol, azithromycin  - repeat  - please cont airway clearance:   - duonebs and HTS (3%) TID with aerobika    RV Failure  PAH (Group 3, 5)  TTE 9/17 showed severe pulmonary HTN (PASP ), severely dilated RV with mildly reduced systolic function, moderately dilated RA. Likely multifactorial in the setting of known COPD, pulmonary sarcoidosis. Pro-BNP improved but still elevated at 2,900. Since admission cumulatively -10.5L and pt appears to be euvolemic on exam  - RHC scheduled on 12/08/22 at 8am-9am  - Please make her NPO after midnight for above. No need to hold prophylactic Lovenox tonight.   - Continue daily weight with strict I/O  - cont daily pro-BNP  - cont IV diuresis however she is approaching euvolemia. At discharge will need to increase home dose   - continue to monitor renal function, Cr has begun to increase with diuresis    Chronic Pulmonary Aspergillosis   Aspergillus Galactomannan Antigen, IgE checked 10/2022 all wnl.   - continue home posaconazole   - pt has outpt evaluation for [facility] scheduled for 10/25 for history of recurrent hemoptysis    Karen Barber was seen, examined and discussed with  Dr. Ala Dach  Thank you for involving [place] in her care. We look forward to following with you.  Recommendations were communicated to the primary team at 10:14 AM  Please page [place] with any questions or concerns at [phone] (pulmonary consult fellow).    Audery Amel, MS4  Theda Belfast, MD  [facility] Division of Pulmonary Diseases and Critical Care Medicine  HISTORY:     History of Present Illness:  Karen Barber is a 57 y.o. female with hx pf pulmonary sarcoidosis, COPD, PAH (baseline home O2 6L), chronic pulmonary aspergillosis, s/p LUL lobectomy 2016 who initially presented to the hospital for a PET scan  to evaluate her sarcoidosis.     Pt was found to have acute on chronic respiratory failure (sat in 70s on home 6L) in the setting of not taking her home medications (sildenafil, lasix, nebs, inhalers) in preparation for PET scan. As her respiratory status did not improve with NRB, placed on HFNC. 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 She received one dose of 125mg  of IV methylprednisolone and then was transferred to the MICU for further management of her hypoxia. Pt had a week long stay in the MICU and was weaned from HFNC to [facility] after IV diuresis and course of antibiotics (completed course of levaquin) for possible pneumonia. Pt has been continuing home airway clearance (Aerobika and HTS neb), and home pulmicort, brovana, atrovent, and PRN albuterol. Showing significant signs of improvement but still having desaturations with ambulation.     Of note, has been hospitalized twice prior to this in the past 6 weeks.  8/18-8/25: presented with increased O2 requirement, increased swelling, brown sputum production.   - treated for serratia pneumonia (completed levaquin)  - Was discharged on AVAPS for home, as well as with HTS/albuterol and [place].  10/21/22-10/25/22: presented with DOE, up to 8-10L with exertion and desats to 70%.   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 A.niger from sputum 08/2016-09/2016.    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    Sarcoidosis 1995    Type 2 diabetes mellitus with hyperglycemia (CMS-HCC) 07/27/2014    Visual impairment     glasses     Past Surgical History:   Procedure Laterality Date    LUNG LOBECTOMY      PR AMPUTATION TOE,MT-P JT Right 12/12/2019    Procedure: Right foot fifth toe amputation at the metatarsophalangeal joint level;  Surgeon: Britt Bottom, DPM;  Location: MAIN OR [facility];  Service: Vascular    PR AMPUTATION TOE,MT-P JT Right 06/16/2020    Procedure: Right foot amputation of 3rd toe at mpj level;  Surgeon: Britt Bottom, DPM;  Location: MAIN OR [facility];  Service: Vascular    PR RIGHT HEART CATH O2 SATURATION & CARDIAC OUTPUT N/A 04/29/2019    Procedure: Right Heart Catheterization;  Surgeon: Rosana Hoes, MD;  Location: [facility] CATH;  Service: Cardiology    PR THORACOSCOPY SURG TOT PULM DECORT Left 12/14/2014    Procedure: THORACOSCOPY SURG; W/TOT PULM DECORTIC/PNEUMOLYS;  Surgeon: Evert Kohl, MD;  Location: MAIN OR [facility];  Service: Cardiothoracic    PR THORACOSCOPY W/THERA WEDGE RESEXN INITIAL UNILAT Left 12/03/2014    Procedure: THORACOSCOPY, SURGICAL; WITH THERAPEUTIC WEDGE RESECTION (EG, MASS, NODULE) INITIAL UNILATERAL;  Surgeon: Jacquelyne Balint Haithcock,  MD;  Location: MAIN OR [place];  Service: Cardiothoracic       Other History:  Family History   Problem Relation Age of Onset    Dementia Mother     Diabetes Father     Diabetes Brother     Diabetes Maternal Aunt     Sarcoidosis Maternal Aunt     Diabetes Maternal Uncle     Diabetes Paternal Aunt     Diabetes Paternal Uncle      Social History     Socioeconomic History    Marital status: Single   Tobacco Use    Smoking status: Former     Current packs/day: 0.00     Average packs/day: 0.5 packs/day for 9.0 years (4.5 ttl pk-yrs)     Types: Cigarettes     Start date: 05/02/1978     Quit date: 05/03/1987     Years since quitting: 35.6    Smokeless tobacco: Never   Vaping Use    Vaping status: Never Used   Substance and Sexual Activity    Alcohol use: No     Alcohol/week: 0.0 standard drinks of alcohol    Drug use: No    Sexual activity: Yes   Other Topics Concern    Do you use sunscreen? No    Tanning bed use? No    Are you easily burned? No    Excessive sun exposure? No    Blistering sunburns? No     Social Determinants of Health     Financial Resource Strain: Low Risk (11/06/2022)    Overall Financial Resource Strain (CARDIA)     Difficulty of Paying Living Expenses: Not hard at all   Food Insecurity: No Food Insecurity (11/06/2022)    Hunger Vital Sign     Worried About Running Out of Food in the Last Year: Never true     Ran Out of Food in the Last Year: Never true   Transportation Needs: No Transportation Needs (11/06/2022)    PRAPARE - Therapist, art (Medical): No     Lack of Transportation (Non-Medical): No       Home Medications:  No current facility-administered medications on file prior to encounter.     Current Outpatient Medications on File Prior to Encounter   Medication Sig Dispense Refill    ACCU-CHEK GUIDE GLUCOSE METER Misc 1 Device by Other route Four (4) times a day (before meals and nightly). AS DIRECTED 1 kit 0    albuterol 2.5 mg /3 mL (0.083 %) nebulizer solution Inhale 3 mL (2.5 mg total) by nebulization every six (6) hours as needed for wheezing or shortness of breath (airway clearance/cough). 360 mL 3    albuterol HFA 90 mcg/actuation inhaler Inhale 2 puffs every four (4) hours as needed for wheezing or shortness of breath. 54 g 0    arformoterol (BROVANA) 15 mcg/2 mL Inhale 2 mL (15 mcg total) by nebulization in the morning and 2 mL (15 mcg total) in the evening. 120 mL 2    aspirin-caffeine (BAYER BACK AND BODY) 500-32.5 mg Tab Take by mouth. Takes 2 1/2 pills 3-4 times a day as needed.      azithromycin (ZITHROMAX) 250 MG tablet Take 1 tablet (250 mg total) by mouth daily. Start on 9/1 after completing the levofloxacin 90 tablet 0    blood sugar diagnostic Strp by Other route Three (3) times a day. ACCU-CHEK Guide meter 300 strip 1    budesonide (PULMICORT)  0.5 mg/2 mL nebulizer solution Inhale 2 mL (0.5 mg total) by nebulization in the morning and 2 mL (0.5 mg total) in the evening. 120 mL 2    furosemide (LASIX) 80 MG tablet Take 1 tablet (80 mg total) by mouth two (2) times a day. Take a third dose as needed for increasing weight or increasing swelling. 60 tablet 0    gabapentin (NEURONTIN) 300 MG capsule Take 1 capsule (300 mg total) by mouth Three (3) times a day. 270 capsule 3    insulin aspart (NOVOLOG U-100 INSULIN ASPART) 100 unit/mL vial Inject 0.01-0.2 mL (1-20 Units total) under the skin Four (4) times a day (before meals and nightly). SLIDING SCALE 20 mL 1    insulin glargine (BASAGLAR KWIKPEN U-100 INSULIN) 100 unit/mL (3 mL) injection pen Inject 0.18 mL (18 Units total) under the skin nightly. 15 mL 0    ipratropium (ATROVENT) 0.02 % nebulizer solution Inhale 2.5 mL (500 mcg total) by nebulization four (4) times a day. 300 mL 0    posaconazole (NOXAFIL) 100 mg delayed released tablet Take 2 tablets by mouth once daily 60 tablet 0    sildenafiL, pulm.hypertension, (REVATIO) 20 mg tablet Take 1 tablet (20 mg total) by mouth Three (3) times a day. 90 tablet 0    OXYGEN-AIR DELIVERY SYSTEMS MISC Dose: 2-3 LPM         In-Hospital Medications:  Scheduled:   arformoterol  15 mcg Nebulization BID (RT)    azithromycin  250 mg Oral Daily    budesonide  0.5 mg Nebulization BID (RT)    enoxaparin (LOVENOX) injection  40 mg Subcutaneous Nightly    furosemide  40 mg Intravenous TID    gabapentin  300 mg Oral TID    insulin lispro  0-20 Units Subcutaneous ACHS    insulin NPH  8 Units Subcutaneous Q12H SCH    ipratropium  500 mcg Nebulization BID (RT)    magnesium oxide-Mg AA chelate  2 tablet Oral BID    polyethylene glycol  17 g Oral Daily    posaconazole  200 mg Oral Daily    sildenafiL (pulm.hypertension)  40 mg Oral TID    sodium chloride  4 mL Nebulization BID (RT)     Continuous Infusions:    PRN Medications:  acetaminophen, albuterol, calcium carbonate, dextrose in water, glucagon, ondansetron **OR** ondansetron, senna, sodium chloride    Allergies:  Allergies as of 11/29/2022    (No Known Allergies)         PHYSICAL EXAM:   BP 89/61  - Pulse 76  - Temp 36.5 ??C (97.7 ??F) (Oral)  - Resp 26  - Ht 180.3 cm (5' 11)  - Wt 86.5 kg (190 lb 11.2 oz)  - LMP  (LMP Unknown)  - SpO2 100%  - BMI 26.60 kg/m??   General: Alert, well-appearing, and in no distress.  Eyes: Anicteric sclera, conjunctiva clear.  ENT:  Nares normal, septum midline, mucosa normal, no drainage or sinus tenderness.  Cardiovascular: Regular rate and rhythm.  Musculoskeletal: No clubbing and no synovitis.    Skin: No rashes or lesions., Mild swelling, L>R  Neuro: No focal neurological deficits.     LABORATORY and RADIOLOGY DATA:     Pertinent Laboratory Data:  Lab Results   Component Value Date    WBC 5.5 12/07/2022    HGB 11.2 (L) 12/07/2022    HCT 34.4 12/07/2022    PLT 205 12/07/2022       Lab Results  Component Value Date    NA 139 12/07/2022    K 4.3 12/07/2022    CL 99 12/07/2022    CO2 38.0 (H) 12/07/2022    BUN 44 (H) 12/07/2022    CREATININE 1.62 (H) 12/07/2022    GLU 138 12/07/2022    CALCIUM 9.5 12/07/2022    MG 1.9 12/07/2022    PHOS 3.8 12/07/2022       Lab Results   Component Value Date    BILITOT 0.4 12/04/2022    BILIDIR <0.10 05/30/2019    PROT 6.1 12/04/2022    ALBUMIN 2.8 (L) 12/04/2022    ALT 8 (L) 12/04/2022    AST 8 12/04/2022    ALKPHOS 63 12/04/2022    GGT 60 (H) 05/31/2011       Lab Results   Component Value Date    INR 1.02 12/11/2019    APTT 30.9 12/11/2019       Pertinent Micro Data:  Microbiology Results (last day)       ** No results found for the last 24 hours. **             Pertinent Imaging Data:  9/11 CXR   - Extensive fibrotic changes and biapical bulla appear grossly unchanged. Extensive pulmonary fibrosis limits evaluation for superimposed infection.     9/17 Echo    Summary    1. Limited study.    2. The left ventricle is normal in size with normal wall thickness.    3. The left ventricular systolic function is normal, LVEF is visually  estimated at > 55%.    4. The right ventricle is severely dilated in size, with mildly reduced  systolic function.    5. There is severe pulmonary hypertension.    6. TR maximum velocity: 4.4 m/s  Estimated PASP: 86 mmHg.    7. IVC size and inspiratory change suggest mildly elevated right atrial  pressure. (5-10 mmHg).    8. No significant change since prior study from 10/23/2022    9/87 PET CT  Impression   Bulky, partially calcified lymph nodes in the mediastinum and bilateral hilar with no significant to mild uptake, decreased in intensity since prior.   Grossly unchanged metabolically active right supraclavicular lymph nodes, accounting for differences in positioning of the arms and lack of IV contrast.   Bronchiectasis and partially metabolically active reticular opacities are seen bilaterally, most prominent in the lower lobes, overall similar to prior.   Dilated main pulmonary artery measuring up to 4.0 cm, suggestive of pulmonary arterial hypertension.

## 2022-12-07 NOTE — Unmapped (Signed)
Additional order received and the patient is already on the caseload. Continue with plan of care.

## 2022-12-07 NOTE — Unmapped (Signed)
On 9L LBNC with sats 88-94%. Sleeping sitting up in bed. Gets SOB with exertion. No c/o pain. Call bell in reached.  Problem: Adult Inpatient Plan of Care  Goal: Plan of Care Review  Outcome: Ongoing - Unchanged  Goal: Patient-Specific Goal (Individualized)  Outcome: Ongoing - Unchanged  Goal: Absence of Hospital-Acquired Illness or Injury  Outcome: Ongoing - Unchanged  Intervention: Identify and Manage Fall Risk  Recent Flowsheet Documentation  Taken 12/06/2022 2000 by Greggory Stallion, RN  Safety Interventions:   fall reduction program maintained   lighting adjusted for tasks/safety   low bed  Intervention: Prevent Infection  Recent Flowsheet Documentation  Taken 12/06/2022 2000 by Greggory Stallion, RN  Infection Prevention:   hand hygiene promoted   rest/sleep promoted   single patient room provided  Goal: Optimal Comfort and Wellbeing  Outcome: Ongoing - Unchanged  Goal: Readiness for Transition of Care  Outcome: Ongoing - Unchanged  Goal: Rounds/Family Conference  Outcome: Ongoing - Unchanged     Problem: Fall Injury Risk  Goal: Absence of Fall and Fall-Related Injury  Outcome: Ongoing - Unchanged  Intervention: Promote Injury-Free Environment  Recent Flowsheet Documentation  Taken 12/06/2022 2000 by Greggory Stallion, RN  Safety Interventions:   fall reduction program maintained   lighting adjusted for tasks/safety   low bed     Problem: Gas Exchange Impaired  Goal: Optimal Gas Exchange  Outcome: Ongoing - Unchanged  Intervention: Optimize Oxygenation and Ventilation  Recent Flowsheet Documentation  Taken 12/07/2022 0200 by Kirby Cortese, RN  Head of Bed [facility]) Positioning: HOB at 30 degrees  Taken 12/07/2022 0000 by Chloe Bluett, RN  Head of Bed [facility]) Positioning: HOB at 30-45 degrees  Taken 12/06/2022 2200 by Kellyanne Ellwanger, RN  Head of Bed [facility]) Positioning: HOB at 30-45 degrees  Taken 12/06/2022 2000 by Karis Emig, RN  Head of Bed (HOB) Positioning: HOB at 30-45 degrees

## 2022-12-07 NOTE — Unmapped (Signed)
Infectious Disease ([facility]) Progress Note    Assessment & Plan:   Karen Barber is a 57 y.o. female whose presentation is complicated by bronchiectasis type 1 in the setting of pulmonary sarcoidosis (6L [place] home O2), HTN, T2DM, COPD, s/p LUL lobectomy, and asthma that presented to [facility] admitted to MICU after becoming hypoxic after holding home meds prior to PET scan. After 1 week after diuresing ICU stabilized patient she was transferred to the floor.    Principal Problem:    Acute on chronic hypoxic respiratory failure (CMS-HCC)  Active Problems:    Pulmonary sarcoidosis (CMS-HCC)    Type 2 diabetes mellitus with hyperglycemia (CMS-HCC)    COPD (chronic obstructive pulmonary disease) (CMS-HCC)    Chronic pulmonary aspergillosis (CMS-HCC)    Pulmonary hypertension (CMS-HCC)    AKI (acute kidney injury) (CMS-HCC)    Active Problems  Acute on Chronic Hypoxemic Respiratory Failure -   Pulmonary Sarcoidosis -   COPD/asthma -   Bronchiectasis  Patient has notable history of pulmonary sarcoidosis, COPD, pulmonary hypertension and is on baseline oxygen 6 L.  Recent admission with pneumonia growing Serratia marcescens  s/p 1x Rocephin (8/18), Vancomycin (8/18-8/19), Zosyn (8/18-8/23) discharged on levofloxacin 500 mg ending on 8/31.  Patient held her medications only for the morning prior to an outpatient PET CT scan during which she became hypoxic to the 70s and was admitted to the MICU.  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 She received one dose of 125mg  of IV methylprednisolone. She was weaned from high flow nasal cannula to large bore nasal cannula after IV diuresis and a course of antibiotics for possible pneumonia with Vanco/cefepime later transition to levofloxacin with a total 1 week treatment with antibiotics. Started on vanc/cefepime for broad coverage --> stopped vanc and cefepime 9/12- then levaquin (9/12---9/16).  Patient was transferred to the floor on 9/18 after showing significant signs of improvement but still having occasional desaturations with ambulation. PET scan 9/18 is not indicative of active pulmonary sarcoidosis.  Acute exacerbation likely multifactorial including in not taking her home medications as per directed for airway clearance, volume overload, and higher O2 requirement chronically than the amount she has been using.  -- Consult pulmonology, appreciate recommendations  -- Continue home airway clearance (DuoNebs and HTS 3 times daily with [place])  -- Continue home Pulmicort, Brovana, Atrovent, as needed albuterol, and azithromycin  -- Wean O2 as tolerated goal SpO2 greater than 90%  -- 6-minute walk test   -- PT and OT consult     Pulmonary hypertension - RV failure - orthostatic hypotension  Patient has history of pulmonary hypertension due to pulmonary sarcoidosis/COPD.  TTE this admission 9/17 with severely dilated right atrium with severe pulmonary hypertension PASP 86 mmHg.  Estimated right atrial pressure 5 to 10 mmHg.  On Lasix 80 mg twice daily at home.  Echo showed LVEF greater than 55% and severe pulmonary hypertension no significant change since prior study.  -- Continue Lasix 40 mg IV twice daily (deferred increase given rising creatinine)  -- RHC scheduled for Friday  -- Continue home Sildenafil 40mg  tid  -- trend pleural BNP  -- strict I/Os  -- Daily weights    Acute kidney injury on CKD3  On admission patient's creatinine 1.31 which appears to be baseline.  On 9/19 patient was found to have an AKI by definition of > 0.3 increase in creatinine in 48 hours.  - Renally dose medications  - Daily BMP     Type 2  diabetes mellitus with hyperglycemia and hypoglycemia  Hemoglobin A1c 8.3%.  Home regimen includes glargine 18 units nightly and sliding scale insulin.  Large fluctuations in glucose greater than 300 and occasional values ~50.    -- Insulin NPH 8 units twice daily  -- Sliding scale insulin insulin sensitivity factor 40  -- Consider adding nutritional insulin    Hypocalcemia  Ionized calcium 4.37  -- If continues to downtrend consider supplementation with oral calcium carbonate   -- BMP    Left leg swelling  Patient endorses intermittent left > right leg swelling.  --Follow-up PVL     Chronic Problems     Chronic Pain   - Continuing home gabapentin   - PT/OT consulted, appreciate recs     Chronic pulmonary aspergillosis  Patient has history of aspergilloma and is on chronic posaconazole. Soluble IL-2 receptor, Aspergillus Galactomannan Antigen, IgE checked 10/2022 all wnl  -Continue home posaconazole Ppx  -Outpatient evaluation for [facility]AE scheduled for 10/25 to evaluate recurrent hemoptysis     Iron deficiency anemia  Chronic normocytic anemia (baseline hemoglobin 11)  Patient has some mild normocytic anemia with hemoglobin of 11.  Iron panel shows iron of 37 with iron saturation of 15.  --Defer iron supplementation at the time given possible infection  --Daily CBC    Hyperlipidemia  Last lipid profile showed an LDL of 146  -- Follow-up outpatient    Issues Impacting Complexity of Management:  -The patient is at high risk for the development of complications of volume overload due to the need to provide IV hydration for suspected hypovolemia in the setting of: Pulmonary hypertension  -Need for the following intensive monitoring parameter(s) due to high risk of clinical decline: continuous oxygen monitoring, telemetry, and frequent monitoring of urine output to assess for the efficacy of diuretic regimen  -Need for intensive oxygen therapy of HFNC, which places the patient at high risk for oxygen toxicity  -The patient is at high risk of complications from pulmonary hypertension    Daily Checklist:  Diet: Regular Diet (salt restricted)  DVT PPx: Lovenox 40mg  q24h  Electrolytes: Replete Potassium to >/= 3.6 and Magnesium to >/= 1.8  Code Status: DNR and DNI  Dispo:  Continue MPCU    Team Contact Information:   Primary Team: Infectious Disease ([facility])  Primary Resident: Conception Chancy, MD,  Resident's Pager: [phone] (Infect Disease Intern - Tower)    Interval History:   No acute events overnight.  This morning patient reports swelling of her left foot.  Patient is concerned about her salt intake and says she feels the need to be on a diet that consists of only boiled eggs and water in order to keep her edema under control.  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  It bothers her because it is an indication of fluid excess throughout the rest of her body and usually correlates with her respiratory status.    Objective:   Temp:  [36.2 ??C (97.1 ??F)-36.5 ??C (97.7 ??F)] 36.4 ??C (97.5 ??F)  Heart Rate:  [74-93] 82  SpO2 Pulse:  [72-94] 83  Resp:  [18-37] 33  BP: (95-113)/(58-88) 107/64  FiO2 (%):  [44 %] 44 %  SpO2:  [94 %-100 %] 100 %,   Intake/Output Summary (Last 24 hours) at 12/07/2022 0725  Last data filed at 12/07/2022 0430  Gross per 24 hour   Intake 340 ml   Output 1100 ml   Net -760 ml   ,  FiO2 (%):  [44 %] 44 %  O2 Device: Large bore nasal cannula (cool high flow)  O2 Flow Rate (L/min):  [6 L/min-9 L/min] 9 L/min    Gen: NAD, converses, appears comfortable on large bore nasal cannula  HENT: atraumatic, normocephalic  Heart: RRR  Lungs: CTAB, no crackles or wheezes  Abdomen: soft, NTND  Extremities: Left foot edematous when compared to right foot

## 2022-12-08 LAB — BASIC METABOLIC PANEL
ANION GAP: 2 mmol/L — ABNORMAL LOW (ref 5–14)
BLOOD UREA NITROGEN: 37 mg/dL — ABNORMAL HIGH (ref 9–23)
BUN / CREAT RATIO: 24
CALCIUM: 9.3 mg/dL (ref 8.7–10.4)
CHLORIDE: 101 mmol/L (ref 98–107)
CO2: 36 mmol/L — ABNORMAL HIGH (ref 20.0–31.0)
CREATININE: 1.55 mg/dL — ABNORMAL HIGH
EGFR CKD-EPI (2021) FEMALE: 38 mL/min/{1.73_m2} — ABNORMAL LOW (ref >=60)
GLUCOSE RANDOM: 108 mg/dL (ref 70–179)
POTASSIUM: 4.2 mmol/L (ref 3.4–4.8)
SODIUM: 139 mmol/L (ref 135–145)

## 2022-12-08 LAB — CBC
HEMATOCRIT: 32.4 % — ABNORMAL LOW (ref 34.0–44.0)
HEMOGLOBIN: 10.4 g/dL — ABNORMAL LOW (ref 11.3–14.9)
MEAN CORPUSCULAR HEMOGLOBIN CONC: 32.1 g/dL (ref 32.0–36.0)
MEAN CORPUSCULAR HEMOGLOBIN: 26.4 pg (ref 25.9–32.4)
MEAN CORPUSCULAR VOLUME: 82.1 fL (ref 77.6–95.7)
MEAN PLATELET VOLUME: 8.9 fL (ref 6.8–10.7)
PLATELET COUNT: 180 10*9/L (ref 150–450)
RED BLOOD CELL COUNT: 3.95 10*12/L (ref 3.95–5.13)
RED CELL DISTRIBUTION WIDTH: 17.3 % — ABNORMAL HIGH (ref 12.2–15.2)
WBC ADJUSTED: 4.1 10*9/L (ref 3.6–11.2)

## 2022-12-08 LAB — PRO-BNP: PRO-BNP: 3291 pg/mL — ABNORMAL HIGH (ref <=300.0)

## 2022-12-08 LAB — MAGNESIUM: MAGNESIUM: 2.1 mg/dL (ref 1.6–2.6)

## 2022-12-08 LAB — PHOSPHORUS: PHOSPHORUS: 4.5 mg/dL (ref 2.4–5.1)

## 2022-12-08 MED ADMIN — gabapentin (NEURONTIN) capsule 300 mg: 300 mg | ORAL | @ 13:00:00

## 2022-12-08 MED ADMIN — sildenafiL (pulm.hypertension) (REVATIO) tablet 40 mg: 40 mg | ORAL | @ 13:00:00

## 2022-12-08 MED ADMIN — heparin (porcine) in NS Manifold Flush: @ 15:00:00 | Stop: 2022-12-08

## 2022-12-08 MED ADMIN — iloprost (VENTAVIS) nebulizer solution: 2.5 ug | RESPIRATORY_TRACT | @ 22:00:00

## 2022-12-08 MED ADMIN — gabapentin (NEURONTIN) capsule 300 mg: 300 mg | ORAL | @ 18:00:00

## 2022-12-08 MED ADMIN — magnesium oxide-Mg AA chelate (Magnesium Plus Protein) 2 tablet: 2 | ORAL | @ 13:00:00

## 2022-12-08 MED ADMIN — budesonide (PULMICORT) nebulizer solution 0.5 mg: .5 mg | RESPIRATORY_TRACT | @ 14:00:00

## 2022-12-08 MED ADMIN — sodium chloride 3 % NEBULIZER solution 4 mL: 4 mL | RESPIRATORY_TRACT | @ 14:00:00

## 2022-12-08 MED ADMIN — ipratropium (ATROVENT) 0.02 % nebulizer solution 500 mcg: 500 ug | RESPIRATORY_TRACT | @ 19:00:00

## 2022-12-08 MED ADMIN — insulin lispro (HumaLOG) injection 0-20 Units: 0-20 [IU] | SUBCUTANEOUS | @ 01:00:00

## 2022-12-08 MED ADMIN — furosemide (LASIX) injection 40 mg: 40 mg | INTRAVENOUS | @ 19:00:00

## 2022-12-08 MED ADMIN — enoxaparin (LOVENOX) syringe 40 mg: 40 mg | SUBCUTANEOUS | @ 01:00:00

## 2022-12-08 MED ADMIN — gabapentin (NEURONTIN) capsule 300 mg: 300 mg | ORAL | @ 01:00:00

## 2022-12-08 MED ADMIN — insulin lispro (HumaLOG) injection 0-20 Units: 0-20 [IU] | SUBCUTANEOUS | @ 21:00:00

## 2022-12-08 MED ADMIN — azithromycin (ZITHROMAX) tablet 250 mg: 250 mg | ORAL | @ 13:00:00 | Stop: 2025-08-25

## 2022-12-08 MED ADMIN — lidocaine (PF) (XYLOCAINE-MPF) 20 mg/mL (2 %) injection: SUBCUTANEOUS | @ 15:00:00 | Stop: 2022-12-08

## 2022-12-08 MED ADMIN — posaconazole (NOXAFIL) delayed released tablet 200 mg: 200 mg | ORAL | @ 13:00:00

## 2022-12-08 MED ADMIN — ipratropium (ATROVENT) 0.02 % nebulizer solution 500 mcg: 500 ug | RESPIRATORY_TRACT | @ 14:00:00

## 2022-12-08 MED ADMIN — insulin NPH (HumuLIN,NovoLIN) injection 8 Units: 8 [IU] | SUBCUTANEOUS | @ 01:00:00

## 2022-12-08 MED ADMIN — sildenafiL (pulm.hypertension) (REVATIO) tablet 40 mg: 40 mg | ORAL | @ 18:00:00

## 2022-12-08 MED ADMIN — furosemide (LASIX) injection 40 mg: 40 mg | INTRAVENOUS | @ 09:00:00

## 2022-12-08 MED ADMIN — magnesium oxide-Mg AA chelate (Magnesium Plus Protein) 2 tablet: 2 | ORAL | @ 01:00:00

## 2022-12-08 MED ADMIN — sildenafiL (pulm.hypertension) (REVATIO) tablet 40 mg: 40 mg | ORAL | @ 01:00:00

## 2022-12-08 MED ADMIN — insulin lispro (HumaLOG) injection 2 Units: 2 [IU] | SUBCUTANEOUS | @ 21:00:00

## 2022-12-08 NOTE — Unmapped (Signed)
Infectious Disease ([facility]) Progress Note    Assessment & Plan:   Karen Barber is a 57 y.o. female whose presentation is complicated by bronchiectasis type 1 in the setting of pulmonary sarcoidosis (6L [place] home O2), HTN, T2DM, COPD, s/p LUL lobectomy, and asthma that presented to [facility] admitted to MICU after becoming hypoxic after holding home meds prior to PET scan. After 1 week after diuresing ICU stabilized patient she was transferred to the floor.    Principal Problem:    Acute on chronic hypoxic respiratory failure (CMS-HCC)  Active Problems:    Pulmonary sarcoidosis (CMS-HCC)    Type 2 diabetes mellitus with hyperglycemia (CMS-HCC)    COPD (chronic obstructive pulmonary disease) (CMS-HCC)    Chronic pulmonary aspergillosis (CMS-HCC)    Pulmonary hypertension (CMS-HCC)    AKI (acute kidney injury) (CMS-HCC)    Active Problems  Acute on Chronic Hypoxemic Respiratory Failure -   Pulmonary Sarcoidosis -   COPD/asthma -   Bronchiectasis  Patient has notable history of pulmonary sarcoidosis, COPD, pulmonary hypertension and is on baseline oxygen 6 L.  Recent admission with pneumonia growing Serratia marcescens  s/p 1x Rocephin (8/18), Vancomycin (8/18-8/19), Zosyn (8/18-8/23) discharged on levofloxacin 500 mg ending on 8/31.  Patient held her medications only for the morning prior to an outpatient PET CT scan during which she became hypoxic to the 70s and was admitted to the MICU.  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 She received one dose of 125mg  of IV methylprednisolone. She was weaned from high flow nasal cannula to large bore nasal cannula after IV diuresis and a course of antibiotics for possible pneumonia with Vanco/cefepime later transition to levofloxacin with a total 1 week treatment with antibiotics. Started on vanc/cefepime for broad coverage --> stopped vanc and cefepime 9/12- then levaquin (9/12---9/16).  Patient was transferred to the floor on 9/18 after showing significant signs of improvement but still having occasional desaturations with ambulation. PET scan 9/18 is not indicative of active pulmonary sarcoidosis.  Acute exacerbation likely multifactorial including in not taking her home medications as per directed for airway clearance, volume overload, and higher O2 requirement chronically than the amount she has been using.  -- Consult pulmonology, appreciate recommendations  -- Continue home airway clearance (DuoNebs and HTS 3 times daily with [place])  -- Continue home Pulmicort, Brovana, Atrovent, as needed albuterol, and azithromycin  -- Wean O2 as tolerated goal SpO2 greater than 90%  -- 6-minute walk test   -- PT and OT consult     Pulmonary hypertension - RV failure - orthostatic hypotension  Patient has history of pulmonary hypertension due to pulmonary sarcoidosis/COPD.  TTE this admission 9/17 with severely dilated right atrium with severe pulmonary hypertension PASP 86 mmHg.  Estimated right atrial pressure 5 to 10 mmHg.  On Lasix 80 mg twice daily at home.  Echo showed LVEF greater than 55% and severe pulmonary hypertension no significant change since prior study.  Right heart cath showed severe pulmonary Hypertension, markedly elevated PVR (9.3 - 10.5) and low cardiac output / index.  --proBNP is 3291 (9/20) from 2900 (9/19)  -- Continue Lasix 40 mg IV twice daily (deferred increase given rising creatinine)  -- Continue home Sildenafil 40mg  tid  -- trend pro BNP  -- strict I/Os  -- Daily weights    Acute kidney injury on CKD3 (downtrending)  On admission patient's creatinine 1.31 which appears to be baseline.  On 9/19 patient was found to have an AKI by definition of >  0.3 increase in creatinine in 48 hours.  - Renally dose medications  - Daily BMP     Type 2 diabetes mellitus with hyperglycemia and hypoglycemia  Hemoglobin A1c 8.3%.  Home regimen includes glargine 18 units nightly and sliding scale insulin.  Large fluctuations in glucose greater than 300 and occasional values ~50.  -- Sliding scale insulin insulin sensitivity factor 40  --12 glargine nightly  --2 units lispro tid     Hypocalcemia  Ionized calcium 4.37  -- If continues to downtrend consider supplementation with oral calcium carbonate   -- BMP    Left leg swelling  Patient endorses intermittent left > right leg swelling.  --Follow-up PVL     Chronic Problems     Chronic Pain   - Continuing home gabapentin   - PT/OT consulted, appreciate recs     Chronic pulmonary aspergillosis  Patient has history of aspergilloma and is on chronic posaconazole. Soluble IL-2 receptor, Aspergillus Galactomannan Antigen, IgE checked 10/2022 all wnl  -Continue home posaconazole Ppx  -Outpatient evaluation for [facility]AE scheduled for 10/25 to evaluate recurrent hemoptysis     Iron deficiency anemia  Chronic normocytic anemia (baseline hemoglobin 11)  Patient has some mild normocytic anemia with hemoglobin of 11. Iron panel shows iron of 37 with iron saturation of 15.  --Defer iron supplementation at the time given possible volume status and wanting to avoid giving fluids  --Daily CBC    Hyperlipidemia  Last lipid profile showed an LDL of 146  -- Follow-up outpatient    Issues Impacting Complexity of Management:  -The patient is at high risk for the development of complications of volume overload due to the need to provide IV hydration for suspected hypovolemia in the setting of: Pulmonary hypertension  -Need for the following intensive monitoring parameter(s) due to high risk of clinical decline: continuous oxygen monitoring, telemetry, and frequent monitoring of urine output to assess for the efficacy of diuretic regimen  -Need for intensive oxygen therapy of HFNC, which places the patient at high risk for oxygen toxicity  -The patient is at high risk of complications from pulmonary hypertension    Daily Checklist:  Diet: Regular Diet (salt restricted)  DVT PPx: Lovenox 40mg  q24h  Electrolytes: Replete Potassium to >/= 3.6 and Magnesium to >/= 1.8  Code Status: DNR and DNI  Dispo:  Continue MPCU    Team Contact Information:   Primary Team: Infectious Disease ([facility])  Primary Resident: Conception Chancy, MD,  Resident's Pager: [phone] (Infect Disease Intern - [facility])    Interval History:   No acute events overnight.    She request that her nebs be 3 times daily.  Pt required 8L LBNC on exertion/ambulation to bathroom. Afebrile. UOP adequate. 1 BM this shift. Appetite adequate. NPO at midnight for right heart cath today.     Objective:   Temp:  [36.5 ??C (97.7 ??F)-36.7 ??C (98.1 ??F)] 36.6 ??C (97.9 ??F)  Heart Rate:  [72-93] 72  SpO2 Pulse:  [72-98] 72  Resp:  [18-34] 34  BP: (87-108)/(61-80) 104/78  SpO2:  [91 %-100 %] 91 %,   Intake/Output Summary (Last 24 hours) at 12/08/2022 0648  Last data filed at 12/08/2022 0515  Gross per 24 hour   Intake 100 ml   Output 1270 ml   Net -1170 ml   , O2 Device: Large bore nasal cannula (cool high flow)  O2 Flow Rate (L/min):  [6 L/min-9 L/min] 8 L/min    Gen: NAD, converses, appears comfortable on  large bore nasal cannula  HENT: atraumatic, normocephalic  Heart: RRR  Lungs: CTAB, no crackles or wheezes  Abdomen: soft, NTND  Extremities: Left foot less edematous than prior exams

## 2022-12-08 NOTE — Unmapped (Signed)
Additional order received and the patient is already on the caseload. Continue with plan of care.

## 2022-12-08 NOTE — Unmapped (Signed)
FINAL CARDIAC CATHETERIZATION REPORT    CONCLUSIONS:  - Severe pulmonary Hypertension  - Markedly elevated PVR (9.3 - 10.5)  - Low cardiac output / index      RECOMMENDATIONS:  - Per inpatient team      PATIENT NAME: Karen Barber, Karen Barber  INDICATION: 57 year old female with pulmonary hypertension  PROCEDURE DATE: 2022-12-08  ACCESS SITE: right internal jugular vein  PHYSICIANS: Verdell Carmine (Fellow - Diagnostic), Angelita Ingles (Attending)  REFERRING: [facility]PCU    Diagnostic procedures: right heart cath  Contrast Used (ml): none reported    Hemodynamics:   BP / Ao (mmHg): 104/77  Mean: 86     Right Heart:  RA: (Mean): 6  (a-wave): 12  (v-wave): 7  RV: 85/10  PA: 82/38  Mean: 47  Wedge: (Mean): 7  (a-wave): 12  (v-wave): 12    Ao sat(%): 97  PA sat(%): 51    Cardiac Output (Fick): 4.3 L/Min Cardiac Index (Fick): 2.1 L/Min/M^2  SVR (Fick): 1488 PVR (Fick): 9.30    Cardiac Output (thermal): 3.8 L/MinCardiac Index (thermal): 1.8 L/Min/M^2  SVR (thermal): 1684PVR (thermal): 10.53      Technique: The right internal jugular vein was accessed using micropuncture technique. Ultrasound was used for venous access.    Catheters used: 29F thermodilution  Complications: none reported  Estimated blood loss: < 30 cc  Radiation: Fluoro time (min): 2.1, Air Kerma Dose (mGy): 13.2, DAP (Gy-cm2): 2.6      I have reviewed the recent history physical and documentation.    No sedation was given independent observer RN was present for the duration of the procedure to assist in patient monitoring.   I (Dr. Angelita Ingles) was present for the entire procedure.        -

## 2022-12-08 NOTE — Unmapped (Signed)
Infectious Disease ([facility]) Progress Note    Assessment & Plan:   Karen Barber is a 57 y.o. female whose presentation is complicated by bronchiectasis type 1 in the setting of pulmonary sarcoidosis (6L [place] home O2), HTN, T2DM, COPD, s/p LUL lobectomy, and asthma that presented to [facility] admitted to MICU after becoming hypoxic after holding home meds prior to PET scan. After 1 week after diuresing ICU stabilized patient she was transferred to the floor.    Principal Problem:    Acute on chronic hypoxic respiratory failure (CMS-HCC)  Active Problems:    Pulmonary sarcoidosis (CMS-HCC)    Type 2 diabetes mellitus with hyperglycemia (CMS-HCC)    COPD (chronic obstructive pulmonary disease) (CMS-HCC)    Chronic pulmonary aspergillosis (CMS-HCC)    Pulmonary hypertension (CMS-HCC)    AKI (acute kidney injury) (CMS-HCC)    Active Problems  Acute on Chronic Hypoxemic Respiratory Failure -   Pulmonary Sarcoidosis -   COPD/asthma -   Bronchiectasis  Patient has notable history of pulmonary sarcoidosis, COPD, pulmonary hypertension and is on baseline oxygen 6 L.  Recent admission with pneumonia growing Serratia marcescens  s/p 1x Rocephin (8/18), Vancomycin (8/18-8/19), Zosyn (8/18-8/23) discharged on levofloxacin 500 mg ending on 8/31.  Patient held her medications only for the morning prior to an outpatient PET CT scan during which she became hypoxic to the 70s and was admitted to the MICU.  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 She received one dose of 125mg  of IV methylprednisolone. She was weaned from high flow nasal cannula to large bore nasal cannula after IV diuresis and a course of antibiotics for possible pneumonia with Vanco/cefepime later transition to levofloxacin with a total 1 week treatment with antibiotics. Started on vanc/cefepime for broad coverage --> stopped vanc and cefepime 9/12- then levaquin (9/12---9/16).  Patient was transferred to the floor on 9/18 after showing significant signs of improvement but still having occasional desaturations with ambulation. PET scan 9/18 is not indicative of active pulmonary sarcoidosis.  Acute exacerbation likely multifactorial including in not taking her home medications as per directed for airway clearance, volume overload, and higher O2 requirement chronically than the amount she has been using.  -- Consult pulmonology, appreciate recommendations  -- Continue home airway clearance (DuoNebs and HTS 3 times daily with [place])  -- Continue home Pulmicort, Brovana, Atrovent, as needed albuterol, and azithromycin  -- Wean O2 as tolerated goal SpO2 greater than 90%  -- 6-minute walk test   -- PT and OT consult     Pulmonary hypertension - RV failure - orthostatic hypotension  Patient has history of pulmonary hypertension due to pulmonary sarcoidosis/COPD.  TTE this admission 9/17 with severely dilated right atrium with severe pulmonary hypertension PASP 86 mmHg.  Estimated right atrial pressure 5 to 10 mmHg.  On Lasix 80 mg twice daily at home.  Echo showed LVEF greater than 55% and severe pulmonary hypertension no significant change since prior study.  -- Continue Lasix 40 mg IV twice daily (deferred increase given rising creatinine)  -- RHC scheduled for Friday  -- Continue home Sildenafil 40mg  tid  -- trend pleural BNP  -- strict I/Os  -- Daily weights    Acute kidney injury on CKD3  On admission patient's creatinine 1.31 which appears to be baseline.  On 9/19 patient was found to have an AKI by definition of > 0.3 increase in creatinine in 48 hours.  - Renally dose medications  - Daily BMP     Type 2  diabetes mellitus with hyperglycemia and hypoglycemia  Hemoglobin A1c 8.3%.  Home regimen includes glargine 18 units nightly and sliding scale insulin.  Large fluctuations in glucose greater than 300 and occasional values ~50.    -- Insulin NPH 8 units twice daily  -- Sliding scale insulin insulin sensitivity factor 40  -- Consider adding nutritional insulin    Hypocalcemia  Ionized calcium 4.37  -- If continues to downtrend consider supplementation with oral calcium carbonate   -- BMP    Left leg swelling  Patient endorses intermittent left > right leg swelling.  --Follow-up PVL     Chronic Problems     Chronic Pain   - Continuing home gabapentin   - PT/OT consulted, appreciate recs     Chronic pulmonary aspergillosis  Patient has history of aspergilloma and is on chronic posaconazole. Soluble IL-2 receptor, Aspergillus Galactomannan Antigen, IgE checked 10/2022 all wnl  -Continue home posaconazole Ppx  -Outpatient evaluation for [facility]AE scheduled for 10/25 to evaluate recurrent hemoptysis     Iron deficiency anemia  Chronic normocytic anemia (baseline hemoglobin 11)  Patient has some mild normocytic anemia with hemoglobin of 11.  Iron panel shows iron of 37 with iron saturation of 15.  --Defer iron supplementation at the time given possible infection  --Daily CBC    Hyperlipidemia  Last lipid profile showed an LDL of 146  -- Follow-up outpatient    Issues Impacting Complexity of Management:  -The patient is at high risk for the development of complications of volume overload due to the need to provide IV hydration for suspected hypovolemia in the setting of: Pulmonary hypertension  -Need for the following intensive monitoring parameter(s) due to high risk of clinical decline: continuous oxygen monitoring, telemetry, and frequent monitoring of urine output to assess for the efficacy of diuretic regimen  -Need for intensive oxygen therapy of HFNC, which places the patient at high risk for oxygen toxicity  -The patient is at high risk of complications from pulmonary hypertension    Daily Checklist:  Diet: Regular Diet (salt restricted)  DVT PPx: Lovenox 40mg  q24h  Electrolytes: Replete Potassium to >/= 3.6 and Magnesium to >/= 1.8  Code Status: DNR and DNI  Dispo:  Continue MPCU    Team Contact Information:   Primary Team: Infectious Disease ([facility])  Primary Resident: Conception Chancy, MD,  Resident's Pager: [phone] (Infect Disease Intern - [facility])    Interval History:   No acute events overnight.  This morning patient reports swelling of her left foot.  Patient is concerned about her salt intake and says she feels the need to be on a diet that consists of only boiled eggs and water in order to keep her edema under control.  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  It bothers her because it is an indication of fluid excess throughout the rest of her body and usually correlates with her respiratory status.    Objective:   Temp:  [36.5 ??C (97.7 ??F)-36.7 ??C (98.1 ??F)] 36.6 ??C (97.9 ??F)  Heart Rate:  [72-93] 72  SpO2 Pulse:  [72-98] 72  Resp:  [18-34] 34  BP: (87-108)/(61-80) 104/78  SpO2:  [91 %-100 %] 91 %,   Intake/Output Summary (Last 24 hours) at 12/08/2022 0647  Last data filed at 12/08/2022 0515  Gross per 24 hour   Intake 100 ml   Output 1270 ml   Net -1170 ml   , O2 Device: Large bore nasal cannula (  cool high flow)  O2 Flow Rate (L/min):  [6 L/min-9 L/min] 8 L/min    Gen: NAD, converses, appears comfortable on large bore nasal cannula  HENT: atraumatic, normocephalic  Heart: RRR  Lungs: CTAB, no crackles or wheezes  Abdomen: soft, NTND  Extremities: Left foot edematous when compared to right foot

## 2022-12-08 NOTE — Unmapped (Signed)
Pulmonary Consult Service  Consult Follow Up Note      Primary Service:   Medicine  Primary Service Attending:  Leveda Anna, MD  Rean for Consult:   Assistance with diuresis    ASSESSMENT and PLAN     Karen Barber is a 57 y.o. 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 Pt was found to have acute on chronic respiratory failure (sat in 70s on home 6L) in the setting of not taking her home medications (sildenafil, lasix, nebs, inhalers) in preparation for PET scan, admitted 9/11. She was admitted to the ICU initially, has since been weaned back to 7-9L [place] with airway clearance and diuresis.     Her acute on chronic hypoxemia and increase O2 requirement is likely multifactorial -- combination of volume overload as well as lack of airway clearance at home. She al likely has higher O2 requirement chronically than the amount she has been using at home.     Acute on Chronic Hypoxemic Respiratory Failure  COPD/Asthma  Pulmonary Sarcoidosis  Bronchiectasis s/s sarcoid  PET/CT scan was ordered prior to admission to evaluate for ongoing pulmonary sarcoidosis activity and for consideration of possible immunosuppressive agents to further treat. Soluble IL-2 receptor, Aspergillus Galactomannan Antigen, IgE checked 10/2022 all wnl. PET scan 9/17 does not appear to be active pulmonary sarcoidosis and again it is overall felt to be burned out from sarcoidosis standpoint. Currently requiring 8 L [place].   - continue home pulmicort, brovana, PRN albuterol, azithromycin  - repeat  - please cont airway clearance:   - duonebs and HTS (3%) TID with aerobika    RV Failure  PAH (Group 3, 5)  TTE 9/17 showed severe pulmonary HTN (PASP ), severely dilated RV with mildly reduced systolic function, moderately dilated RA. Likely multifactorial in the setting of known COPD, pulmonary sarcoidosis. Pro-BNP still elevated at 3,291. Since admission cumulatively -11.6 L and pt appears to be euvolemic on exam. RHC 9/20 demonstrated severe pulmonary hypertension, markedly elevated PVR (9.3-10.5) and low cardiac output/index.  - continue sildenafil 40 mg  - Start inhaled iloprost 2.5 mcg q4h (ordered)  - Per patient she has only tried powder tyva in the past,  if she tolerates iloprost well then plan to start nebulized tyva at discharge.   - Continue daily weight with strict I/O  - cont daily pro-BNP  - continue to monitor renal function, Cr still elevated above baseline  - can transition to home dose oral diuretics (80 mg BID) since her wedge pressure is 5    Chronic Pulmonary Aspergillosis   Aspergillus Galactomannan Antigen, IgE checked 10/2022 all wnl.   - continue home posaconazole   - pt has outpt evaluation for [facility] scheduled for 10/25 for history of recurrent hemoptysis    [name] was seen, examined and discussed with  Dr. Ala Dach  Thank you for involving [place] in her care. We look forward to following with you.  Recommendations were communicated to the primary team at 10:14 AM  Please page [place] with any questions or concerns at [phone] (pulmonary consult fellow).    Karen Barber, MS4  Theda Belfast, MD  [facility]    HISTORY:     History of Present Illness:  Karen Barber is  a 57 y.o. female with hx pf pulmonary sarcoidosis, COPD, PAH (baseline home O2 6L), chronic pulmonary aspergillosis, s/p LUL lobectomy 2016 who initially presented to the hospital for a PET scan to evaluate her sarcoidosis.     Pt was found to have acute on chronic respiratory failure (sat in 70s on home 6L) in the setting of not taking her home medications (sildenafil, lasix, nebs, inhalers) in preparation for PET scan. As her respiratory status did not improve with NRB, placed on HFNC. 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 She received one dose of 125mg  of IV methylprednisolone and then was transferred to the MICU for further management of her hypoxia. Pt had a week long stay in the MICU and was weaned from HFNC to [facility] after IV diuresis and course of antibiotics (completed course of levaquin) for possible pneumonia. Pt has been continuing home airway clearance (Aerobika and HTS neb), and home pulmicort, brovana, atrovent, and PRN albuterol. Showing significant signs of improvement but still having desaturations with ambulation.     Of note, has been hospitalized twice prior to this in the past 6 weeks.  8/18-8/25: presented with increased O2 requirement, increased swelling, brown sputum production.   - treated for serratia pneumonia (completed levaquin)  - Was discharged on AVAPS for home, as well as with HTS/albuterol and [place].  10/21/22-10/25/22: presented with DOE, up to 8-10L with exertion and desats to 70%.   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 A.niger from sputum 08/2016-09/2016.    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    Sarcoidosis 1995    Type 2 diabetes mellitus with hyperglycemia (CMS-HCC) 07/27/2014    Visual impairment     glasses     Past Surgical History:   Procedure Laterality Date    LUNG LOBECTOMY      PR AMPUTATION TOE,MT-P JT Right 12/12/2019    Procedure: Right foot fifth toe amputation at the metatarsophalangeal joint level;  Surgeon: Britt Bottom, DPM;  Location: MAIN OR [facility];  Service: Vascular    PR AMPUTATION TOE,MT-P JT Right 06/16/2020    Procedure: Right foot amputation of 3rd toe at mpj level;  Surgeon: Britt Bottom, DPM;  Location: MAIN OR [facility];  Service: Vascular    PR RIGHT HEART CATH O2 SATURATION & CARDIAC OUTPUT N/A 04/29/2019    Procedure: Right Heart Catheterization;  Surgeon: Rosana Hoes, MD;  Location: [facility] CATH;  Service: Cardiology    PR THORACOSCOPY SURG TOT PULM DECORT Left 12/14/2014    Procedure: THORACOSCOPY SURG; W/TOT PULM DECORTIC/PNEUMOLYS;  Surgeon: Evert Kohl, MD;  Location: MAIN OR [facility];  Service: Cardiothoracic  PR THORACOSCOPY W/THERA WEDGE RESEXN INITIAL UNILAT Left 12/03/2014    Procedure: THORACOSCOPY, SURGICAL; WITH THERAPEUTIC WEDGE RESECTION (EG, MASS, NODULE) INITIAL UNILATERAL;  Surgeon: Evert Kohl, MD;  Location: MAIN OR [facility];  Service: Cardiothoracic       Other History:  Family History   Problem Relation Age of Onset    Dementia Mother     Diabetes Father     Diabetes Brother     Diabetes Maternal Aunt     Sarcoidosis Maternal Aunt     Diabetes Maternal Uncle     Diabetes Paternal Aunt     Diabetes Paternal Uncle      Social History     Socioeconomic History    Marital status: Single   Tobacco Use    Smoking status: Former     Current packs/day: 0.00     Average packs/day: 0.5 packs/day for 9.0 years (4.5 ttl pk-yrs)     Types: Cigarettes     Start date: 05/02/1978     Quit date: 05/03/1987     Years since quitting: 35.6    Smokeless tobacco: Never   Vaping Use    Vaping status: Never Used   Substance and Sexual Activity    Alcohol use: No     Alcohol/week: 0.0 standard drinks of alcohol    Drug use: No    Sexual activity: Yes   Other Topics Concern    Do you use sunscreen? No    Tanning bed use? No    Are you easily burned? No    Excessive sun exposure? No    Blistering sunburns? No     Social Determinants of Health     Financial Resource Strain: Low Risk  (11/06/2022)    Overall Financial Resource Strain (CARDIA)     Difficulty of Paying Living Expenses: Not hard at all   Food Insecurity: No Food Insecurity (11/06/2022)    Hunger Vital Sign     Worried About Running Out of Food in the Last Year: Never true     Ran Out of Food in the Last Year: Never true   Transportation Needs: No Transportation Needs (11/06/2022)    PRAPARE - Therapist, art (Medical): No     Lack of Transportation (Non-Medical): No       Home Medications:  No current facility-administered medications on file prior to encounter.     Current Outpatient Medications on File Prior to Encounter   Medication Sig Dispense Refill    ACCU-CHEK GUIDE GLUCOSE METER Misc 1 Device by Other route Four (4) times a day (before meals and nightly). AS DIRECTED 1 kit 0    albuterol 2.5 mg /3 mL (0.083 %) nebulizer solution Inhale 3 mL (2.5 mg total) by nebulization every six (6) hours as needed for wheezing or shortness of breath (airway clearance/cough). 360 mL 3    albuterol HFA 90 mcg/actuation inhaler Inhale 2 puffs every four (4) hours as needed for wheezing or shortness of breath. 54 g 0    arformoterol (BROVANA) 15 mcg/2 mL Inhale 2 mL (15 mcg total) by nebulization in the morning and 2 mL (15 mcg total) in the evening. 120 mL 2    aspirin-caffeine (BAYER BACK AND BODY) 500-32.5 mg Tab Take by mouth. Takes 2 1/2 pills 3-4 times a day as needed.      azithromycin (ZITHROMAX) 250 MG tablet Take 1 tablet (250 mg total) by mouth daily. Start on 9/1 after completing the  levofloxacin 90 tablet 0    blood sugar diagnostic Strp by Other route Three (3) times a day. ACCU-CHEK Guide meter 300 strip 1    budesonide (PULMICORT) 0.5 mg/2 mL nebulizer solution Inhale 2 mL (0.5 mg total) by nebulization in the morning and 2 mL (0.5 mg total) in the evening. 120 mL 2    furosemide (LASIX) 80 MG tablet Take 1 tablet (80 mg total) by mouth two (2) times a day. Take a third dose as needed for increasing weight or increasing swelling. 60 tablet 0    gabapentin (NEURONTIN) 300 MG capsule Take 1 capsule (300 mg total) by mouth Three (3) times a day. 270 capsule 3    insulin aspart (NOVOLOG U-100 INSULIN ASPART) 100 unit/mL vial Inject 0.01-0.2 mL (1-20 Units total) under the skin Four (4) times a day (before meals and nightly). SLIDING SCALE 20 mL 1    insulin glargine (BASAGLAR KWIKPEN U-100 INSULIN) 100 unit/mL (3 mL) injection pen Inject 0.18 mL (18 Units total) under the skin nightly. 15 mL 0    ipratropium (ATROVENT) 0.02 % nebulizer solution Inhale 2.5 mL (500 mcg total) by nebulization four (4) times a day. 300 mL 0    posaconazole (NOXAFIL) 100 mg delayed released tablet Take 2 tablets by mouth once daily 60 tablet 0    sildenafiL, pulm.hypertension, (REVATIO) 20 mg tablet Take 1 tablet (20 mg total) by mouth Three (3) times a day. 90 tablet 0    OXYGEN-AIR DELIVERY SYSTEMS MISC Dose: 2-3 LPM         In-Hospital Medications:  Scheduled:   [Transfer Hold] arformoterol  15 mcg Nebulization BID (RT)    [Transfer Hold] azithromycin  250 mg Oral Daily    [Transfer Hold] budesonide  0.5 mg Nebulization BID (RT)    [Transfer Hold] enoxaparin (LOVENOX) injection  40 mg Subcutaneous Nightly    [Transfer Hold] furosemide  40 mg Intravenous BID    [Transfer Hold] gabapentin  300 mg Oral TID    [Transfer Hold] insulin lispro  0-20 Units Subcutaneous ACHS    [Transfer Hold] insulin NPH  8 Units Subcutaneous Q12H SCH    [Transfer Hold] ipratropium  500 mcg Nebulization TID (RT)    [Transfer Hold] magnesium oxide-Mg AA chelate  2 tablet Oral BID    [Transfer Hold] polyethylene glycol  17 g Oral Daily    [Transfer Hold] posaconazole  200 mg Oral Daily    [Transfer Hold] sildenafiL (pulm.hypertension)  40 mg Oral TID    [Transfer Hold] sodium chloride  4 mL Nebulization BID (RT)     Continuous Infusions:    PRN Medications:  [Transfer Hold] acetaminophen, [Transfer Hold] albuterol, [Transfer Hold] calcium carbonate, [Transfer Hold] dextrose in water, [Transfer Hold] glucagon, [Transfer Hold] ondansetron **OR** [Transfer Hold] ondansetron, [Transfer Hold] senna, [Transfer Hold] sodium chloride    Allergies:  Allergies as of 11/29/2022    (No Known Allergies)         PHYSICAL EXAM:   BP 92/73  - Pulse 72  - Temp 36.7 ??C (98 ??F) (Oral)  - Resp 20  - Ht 180.3 cm (5' 11)  - Wt 87.1 kg (192 lb 0.3 oz)  - LMP  (LMP Unknown)  - SpO2 100%  - BMI 26.78 kg/m??   General: Alert, well-appearing, and in no distress.  Eyes: Anicteric sclera, conjunctiva clear.  ENT:  Nares normal, septum midline, mucosa normal, no drainage or sinus tenderness.  Cardiovascular: Regular rate and rhythm.  Musculoskeletal: No clubbing  and no synovitis.    Skin: No rashes or lesions., Mild swelling, L>R  Neuro: No focal neurological deficits.     LABORATORY and RADIOLOGY DATA:     Pertinent Laboratory Data:  Lab Results   Component Value Date    WBC 4.1 12/08/2022    HGB 10.3 (L) 12/08/2022    HCT 32.4 (L) 12/08/2022    PLT 180 12/08/2022       Lab Results   Component Value Date    NA 139 12/08/2022    K 4.2 12/08/2022    CL 101 12/08/2022    CO2 36.0 (H) 12/08/2022    BUN 37 (H) 12/08/2022    CREATININE 1.55 (H) 12/08/2022    GLU 108 12/08/2022    CALCIUM 9.3 12/08/2022    MG 2.1 12/08/2022    PHOS 4.5 12/08/2022       Lab Results   Component Value Date    BILITOT 0.4 12/04/2022    BILIDIR <0.10 05/30/2019    PROT 6.1 12/04/2022    ALBUMIN 2.8 (L) 12/04/2022    ALT 8 (L) 12/04/2022    AST 8 12/04/2022    ALKPHOS 63 12/04/2022    GGT 60 (H) 05/31/2011       Lab Results   Component Value Date    INR 1.02 12/11/2019    APTT 30.9 12/11/2019       Pertinent Micro Data:  Microbiology Results (last day)       ** No results found for the last 24 hours. **             Pertinent Imaging Data:  9/11 CXR   - Extensive fibrotic changes and biapical bulla appear grossly unchanged. Extensive pulmonary fibrosis limits evaluation for superimposed infection.     9/17 Echo    Summary    1. Limited study.    2. The left ventricle is normal in size with normal wall thickness.    3. The left ventricular systolic function is normal, LVEF is visually  estimated at > 55%.    4. The right ventricle is severely dilated in size, with mildly reduced  systolic function.    5. There is severe pulmonary hypertension.    6. TR maximum velocity: 4.4 m/s  Estimated PASP: 86 mmHg.    7. IVC size and inspiratory change suggest mildly elevated right atrial  pressure. (5-10 mmHg).    8. No significant change since prior study from 10/23/2022    9/87 PET CT  Impression   Bulky, partially calcified lymph nodes in the mediastinum and bilateral hilar with no significant to mild uptake, decreased in intensity since prior.   Grossly unchanged metabolically active right supraclavicular lymph nodes, accounting for differences in positioning of the arms and lack of IV contrast.   Bronchiectasis and partially metabolically active reticular opacities are seen bilaterally, most prominent in the lower lobes, overall similar to prior.   Dilated main pulmonary artery measuring up to 4.0 cm, suggestive of pulmonary arterial hypertension.     9/20 RHC  FINAL CARDIAC CATHETERIZATION REPORT    CONCLUSIONS:  - Severe pulmonary Hypertension  - Markedly elevated PVR (9.3 - 10.5)  - Low cardiac output / index

## 2022-12-08 NOTE — Unmapped (Addendum)
Soft pressures noted. Increased O2 to 8L as pt keeps on desaturating to low 80's at start of shift. No c/o SOB. Resting at night. No c/o discomfort. NPO after midnight.  Problem: Adult Inpatient Plan of Care  Goal: Plan of Care Review  Outcome: Ongoing - Unchanged  Goal: Patient-Specific Goal (Individualized)  Outcome: Ongoing - Unchanged  Goal: Absence of Hospital-Acquired Illness or Injury  Outcome: Ongoing - Unchanged  Intervention: Identify and Manage Fall Risk  Recent Flowsheet Documentation  Taken 12/07/2022 2000 by Greggory Stallion, RN  Safety Interventions:   fall reduction program maintained   lighting adjusted for tasks/safety   low bed  Intervention: Prevent Infection  Recent Flowsheet Documentation  Taken 12/07/2022 2000 by Greggory Stallion, RN  Infection Prevention:   hand hygiene promoted   rest/sleep promoted   single patient room provided  Goal: Optimal Comfort and Wellbeing  Outcome: Ongoing - Unchanged  Goal: Readiness for Transition of Care  Outcome: Ongoing - Unchanged  Goal: Rounds/Family Conference  Outcome: Ongoing - Unchanged     Problem: Fall Injury Risk  Goal: Absence of Fall and Fall-Related Injury  Outcome: Ongoing - Unchanged  Intervention: Promote Injury-Free Environment  Recent Flowsheet Documentation  Taken 12/07/2022 2000 by Greggory Stallion, RN  Safety Interventions:   fall reduction program maintained   lighting adjusted for tasks/safety   low bed     Problem: Gas Exchange Impaired  Goal: Optimal Gas Exchange  Outcome: Ongoing - Unchanged  Intervention: Optimize Oxygenation and Ventilation  Recent Flowsheet Documentation  Taken 12/08/2022 0000 by Greggory Stallion, RN  Head of Bed [facility]) Positioning: HOB at 30 degrees  Taken 12/07/2022 2200 by Alexiya Franqui, RN  Head of Bed [facility]) Positioning: HOB at 30-45 degrees  Taken 12/07/2022 2000 by Aubre Quincy, RN  Head of Bed (HOB) Positioning: HOB at 30-45 degrees

## 2022-12-08 NOTE — Unmapped (Signed)
Cardiac Catheterization Laboratory  [place], [place]  Tel: [phone]     Fax: [phone]    HISTORY & PHYSICAL ASSESSMENT    PCP:  Loran Senters, FNP  Phone: [phone]  Fax: [phone]    Referring physicians: Self, Referred  No address on file     Primary cardiologist: N/A    Procedures to be performed:   Right heart catheterization    Indication: Pulmonary hypertension    Consent: I hereby certify that the nature, purpose, benefits, usual and most frequent risks of, and alternatives to, the operation or procedure have been explained to the patient (or person authorized to sign for the patient) either by a physician or by the provider who is to perform the operation or procedure, that the patient has had an opportunity to ask questions, and that those questions have been answered. The patient or the patient's representative has been advised that selected tasks may be performed by assistants to the primary health care provider(s). I believe that the patient (or person authorized to sign for the patient) understands what has been explained, and has consented to the operation or procedure.  _____________________________________________________________________    HISTORY: This is a 57 y.o. female with PMH of PH (Group 3, 5) with RV failure on sildenafil, COPD, pulmonary sarcoidosis c/b bronchiectasis, chronic pulmonary aspergillosis s/p LUL lobectomy (2016) who presents today for RHC.    Patient was admitted to the MICU on 11/29/2022 for acute on chronic hypoxemic respiratory failure in the setting of holding her home medications in preparation for a PET scan. With diuresis and airway clearance, her O2 requirements were weaned from HFNC to LB. She was also treated with a course of antibiotics for possible PNA. TTE on 9/17 showed EF >55%, severely dilated RV with mildly reduced systolic function, and severe pulmonary hypertension (estimated PASP 86 mmHg). RHC scheduled for invasive hemodynamic evaluation.     Of note, she is on 6L [place] at home.     Labs: Hbg 10.4, Plt 180, Cr 1.5, INR N/A    TTE:   - 12/05/2022:    1. Limited study.    2. The left ventricle is normal in size with normal wall thickness.    3. The left ventricular systolic function is normal, LVEF is visually  estimated at > 55%.    4. The right ventricle is severely dilated in size, with mildly reduced  systolic function.    5. There is severe pulmonary hypertension.    6. TR maximum velocity: 4.4 m/s  Estimated PASP: 86 mmHg.    7. IVC size and inspiratory change suggest mildly elevated right atrial  pressure. (5-10 mmHg).    8. No significant change since prior study from 10/23/2022.    Last RHC:   - 01/04/2022 at [facility]:  RA: 8 mmHg (mean)  RV: 75/ 10 mmHg  PA: 75/ 34 45 mmHg (mean)  PCW: 16 mmHg (mean)  AV O2: 5.1 vol%  Cardiac output: 4.9 L/min  Cardiac index: 2.3 L/min-m2  PVR: 5.9 [place] units    Objective  BP 104/78  - Pulse 72  - Temp 36.6 ??C (97.9 ??F) (Oral)  - Resp (!) 34  - Ht 180.3 cm (5' 11)  - Wt 87.1 kg (192 lb 0.3 oz)  - LMP  (LMP Unknown)  - SpO2 91%  - BMI 26.78 kg/m??     GENERAL: Comfortable appearing.  CV: Normal rate, regular rhythm.  PULM: Normal work of breathing.  EXT: Moves  all extremities.  NEURO: Non-focal.    Labs and imaging were reviewed.

## 2022-12-09 LAB — BASIC METABOLIC PANEL
ANION GAP: 3 mmol/L — ABNORMAL LOW (ref 5–14)
BLOOD UREA NITROGEN: 35 mg/dL — ABNORMAL HIGH (ref 9–23)
BUN / CREAT RATIO: 23
CALCIUM: 9.8 mg/dL (ref 8.7–10.4)
CHLORIDE: 101 mmol/L (ref 98–107)
CO2: 35 mmol/L — ABNORMAL HIGH (ref 20.0–31.0)
CREATININE: 1.51 mg/dL — ABNORMAL HIGH
EGFR CKD-EPI (2021) FEMALE: 40 mL/min/{1.73_m2} — ABNORMAL LOW (ref >=60)
GLUCOSE RANDOM: 112 mg/dL (ref 70–179)
POTASSIUM: 4.4 mmol/L (ref 3.4–4.8)
SODIUM: 139 mmol/L (ref 135–145)

## 2022-12-09 LAB — CBC
HEMATOCRIT: 36.3 % (ref 34.0–44.0)
HEMOGLOBIN: 11.5 g/dL (ref 11.3–14.9)
MEAN CORPUSCULAR HEMOGLOBIN CONC: 31.8 g/dL — ABNORMAL LOW (ref 32.0–36.0)
MEAN CORPUSCULAR HEMOGLOBIN: 26.1 pg (ref 25.9–32.4)
MEAN CORPUSCULAR VOLUME: 82.2 fL (ref 77.6–95.7)
MEAN PLATELET VOLUME: 8.7 fL (ref 6.8–10.7)
PLATELET COUNT: 194 10*9/L (ref 150–450)
RED BLOOD CELL COUNT: 4.41 10*12/L (ref 3.95–5.13)
RED CELL DISTRIBUTION WIDTH: 17.3 % — ABNORMAL HIGH (ref 12.2–15.2)
WBC ADJUSTED: 4.4 10*9/L (ref 3.6–11.2)

## 2022-12-09 LAB — MAGNESIUM: MAGNESIUM: 2.1 mg/dL (ref 1.6–2.6)

## 2022-12-09 LAB — PRO-BNP: PRO-BNP: 2578 pg/mL — ABNORMAL HIGH (ref <=300.0)

## 2022-12-09 LAB — PHOSPHORUS: PHOSPHORUS: 4.6 mg/dL (ref 2.4–5.1)

## 2022-12-09 MED ADMIN — arformoterol (BROVANA) nebulizer solution 15 mcg/2 mL: 15 ug | RESPIRATORY_TRACT

## 2022-12-09 MED ADMIN — magnesium oxide-Mg AA chelate (Magnesium Plus Protein) 2 tablet: 2 | ORAL | @ 13:00:00

## 2022-12-09 MED ADMIN — ipratropium-albuterol (DUO-NEB) 0.5-2.5 mg/3 mL nebulizer solution 3 mL: 3 mL | RESPIRATORY_TRACT | @ 17:00:00

## 2022-12-09 MED ADMIN — insulin lispro (HumaLOG) injection 2 Units: 2 [IU] | SUBCUTANEOUS | @ 17:00:00

## 2022-12-09 MED ADMIN — ipratropium-albuterol (DUO-NEB) 0.5-2.5 mg/3 mL nebulizer solution 3 mL: 3 mL | RESPIRATORY_TRACT | @ 13:00:00

## 2022-12-09 MED ADMIN — budesonide (PULMICORT) nebulizer solution 0.5 mg: .5 mg | RESPIRATORY_TRACT | @ 13:00:00

## 2022-12-09 MED ADMIN — gabapentin (NEURONTIN) capsule 300 mg: 300 mg | ORAL | @ 18:00:00

## 2022-12-09 MED ADMIN — acetaminophen (TYLENOL) tablet 650 mg: 650 mg | ORAL | @ 23:00:00

## 2022-12-09 MED ADMIN — sildenafiL (pulm.hypertension) (REVATIO) tablet 40 mg: 40 mg | ORAL | @ 13:00:00

## 2022-12-09 MED ADMIN — sodium chloride 3 % NEBULIZER solution 4 mL: 4 mL | RESPIRATORY_TRACT | @ 01:00:00

## 2022-12-09 MED ADMIN — insulin glargine (LANTUS) injection 12 Units: 12 [IU] | SUBCUTANEOUS | @ 01:00:00

## 2022-12-09 MED ADMIN — insulin lispro (HumaLOG) injection 2 Units: 2 [IU] | SUBCUTANEOUS | @ 13:00:00

## 2022-12-09 MED ADMIN — azithromycin (ZITHROMAX) tablet 250 mg: 250 mg | ORAL | @ 13:00:00 | Stop: 2025-08-25

## 2022-12-09 MED ADMIN — insulin lispro (HumaLOG) injection 2 Units: 2 [IU] | SUBCUTANEOUS | @ 21:00:00

## 2022-12-09 MED ADMIN — sodium chloride 3 % NEBULIZER solution 4 mL: 4 mL | RESPIRATORY_TRACT

## 2022-12-09 MED ADMIN — iloprost (VENTAVIS) nebulizer solution: 2.5 ug | RESPIRATORY_TRACT | @ 01:00:00

## 2022-12-09 MED ADMIN — sodium chloride 3 % NEBULIZER solution 4 mL: 4 mL | RESPIRATORY_TRACT | @ 13:00:00

## 2022-12-09 MED ADMIN — furosemide (LASIX) tablet 80 mg: 80 mg | ORAL | @ 18:00:00

## 2022-12-09 MED ADMIN — budesonide (PULMICORT) nebulizer solution 0.5 mg: .5 mg | RESPIRATORY_TRACT

## 2022-12-09 MED ADMIN — gabapentin (NEURONTIN) capsule 300 mg: 300 mg | ORAL | @ 13:00:00

## 2022-12-09 MED ADMIN — iloprost (VENTAVIS) nebulizer solution: 2.5 ug | RESPIRATORY_TRACT | @ 19:00:00

## 2022-12-09 MED ADMIN — ipratropium-albuterol (DUO-NEB) 0.5-2.5 mg/3 mL nebulizer solution 3 mL: 3 mL | RESPIRATORY_TRACT

## 2022-12-09 MED ADMIN — sildenafiL (pulm.hypertension) (REVATIO) tablet 40 mg: 40 mg | ORAL | @ 18:00:00

## 2022-12-09 MED ADMIN — insulin lispro (HumaLOG) injection 0-20 Units: 0-20 [IU] | SUBCUTANEOUS | @ 01:00:00

## 2022-12-09 MED ADMIN — iloprost (VENTAVIS) nebulizer solution: 2.5 ug | RESPIRATORY_TRACT | @ 13:00:00

## 2022-12-09 MED ADMIN — enoxaparin (LOVENOX) syringe 40 mg: 40 mg | SUBCUTANEOUS | @ 01:00:00

## 2022-12-09 MED ADMIN — iloprost (VENTAVIS) nebulizer solution: 2.5 ug | RESPIRATORY_TRACT | @ 22:00:00

## 2022-12-09 MED ADMIN — arformoterol (BROVANA) nebulizer solution 15 mcg/2 mL: 15 ug | RESPIRATORY_TRACT | @ 01:00:00

## 2022-12-09 MED ADMIN — iloprost (VENTAVIS) nebulizer solution: 2.5 ug | RESPIRATORY_TRACT | @ 04:00:00

## 2022-12-09 MED ADMIN — magnesium oxide-Mg AA chelate (Magnesium Plus Protein) 2 tablet: 2 | ORAL | @ 01:00:00

## 2022-12-09 MED ADMIN — sildenafiL (pulm.hypertension) (REVATIO) tablet 40 mg: 40 mg | ORAL | @ 01:00:00

## 2022-12-09 MED ADMIN — albuterol 2.5 mg /3 mL (0.083 %) nebulizer solution 2.5 mg: 2.5 mg | RESPIRATORY_TRACT | @ 01:00:00

## 2022-12-09 MED ADMIN — budesonide (PULMICORT) nebulizer solution 0.5 mg: .5 mg | RESPIRATORY_TRACT | @ 01:00:00

## 2022-12-09 MED ADMIN — ipratropium (ATROVENT) 0.02 % nebulizer solution 500 mcg: 500 ug | RESPIRATORY_TRACT | @ 01:00:00

## 2022-12-09 MED ADMIN — gabapentin (NEURONTIN) capsule 300 mg: 300 mg | ORAL | @ 01:00:00

## 2022-12-09 MED ADMIN — iloprost (VENTAVIS) nebulizer solution: 2.5 ug | RESPIRATORY_TRACT | @ 17:00:00

## 2022-12-09 MED ADMIN — insulin lispro (HumaLOG) injection 0-20 Units: 0-20 [IU] | SUBCUTANEOUS | @ 17:00:00

## 2022-12-09 MED ADMIN — posaconazole (NOXAFIL) delayed released tablet 200 mg: 200 mg | ORAL | @ 13:00:00

## 2022-12-09 MED ADMIN — furosemide (LASIX) injection 40 mg: 40 mg | INTRAVENOUS | @ 10:00:00 | Stop: 2022-12-09

## 2022-12-09 NOTE — Unmapped (Signed)
Infectious Disease ([facility]) Progress Note    Assessment & Plan:   Karen Barber is a 57 y.o. female whose presentation is complicated by bronchiectasis type 1 in the setting of pulmonary sarcoidosis (6L [place] home O2), HTN, T2DM, COPD, s/p LUL lobectomy, and asthma that presented to [facility] admitted to MICU after becoming hypoxic after holding home meds prior to PET scan. After 1 week after diuresing ICU stabilized patient she was transferred to the floor.    Principal Problem:    Acute on chronic hypoxic respiratory failure (CMS-HCC)  Active Problems:    Pulmonary sarcoidosis (CMS-HCC)    Type 2 diabetes mellitus with hyperglycemia (CMS-HCC)    COPD (chronic obstructive pulmonary disease) (CMS-HCC)    Chronic pulmonary aspergillosis (CMS-HCC)    Pulmonary hypertension (CMS-HCC)    AKI (acute kidney injury) (CMS-HCC)    Active Problems  Acute on Chronic Hypoxemic Respiratory Failure -   Pulmonary Sarcoidosis -   COPD/asthma -   Bronchiectasis  Patient has notable history of pulmonary sarcoidosis, COPD, pulmonary hypertension and is on baseline oxygen 6 L.  Recent admission with pneumonia growing Serratia marcescens  s/p 1x Rocephin (8/18), Vancomycin (8/18-8/19), Zosyn (8/18-8/23) discharged on levofloxacin 500 mg ending on 8/31.  Patient held her medications only for the morning prior to an outpatient PET CT scan during which she became hypoxic to the 70s and was admitted to the MICU.  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 She received one dose of 125mg  of IV methylprednisolone. She was weaned from high flow nasal cannula to large bore nasal cannula after IV diuresis and a course of antibiotics for possible pneumonia with Vanco/cefepime later transition to levofloxacin with a total 1 week treatment with antibiotics. Started on vanc/cefepime for broad coverage --> stopped vanc and cefepime 9/12- then levaquin (9/12---9/16).  Patient was transferred to the floor on 9/18 after showing significant signs of improvement but still having occasional desaturations with ambulation. PET scan 9/18 is not indicative of active pulmonary sarcoidosis.  Acute exacerbation likely multifactorial including in not taking her home medications as per directed for airway clearance, volume overload, and higher O2 requirement chronically than the amount she has been using. RHC 9/20 showing significant pulm HTN disease with mean PA pressure of 47, for which pulmonology team started Iloprost. Notably, wedge pressure on that study was 5, which is reassuring against volume as driver of her respiratory status at that time.  -- Consult pulmonology, appreciate recommendations  -- Continue home airway clearance (DuoNebs and HTS 3 times daily with [place])  -- Continue home Pulmicort, Brovana, Atrovent, as needed albuterol, and azithromycin  -- Lasix PO 80 mg BID  -- Wean O2 as tolerated goal SpO2 greater than 90%  -- 6-minute walk test   -- PT and OT consult     Pulmonary hypertension - RV failure - orthostatic hypotension  Patient has history of pulmonary hypertension due to pulmonary sarcoidosis/COPD.  TTE this admission 9/17 with severely dilated right atrium with severe pulmonary hypertension PASP 86 mmHg.  Estimated right atrial pressure 5 to 10 mmHg.  On Lasix 80 mg twice daily at home.  Echo showed LVEF greater than 55% and severe pulmonary hypertension no significant change since prior study.  Right heart cath showed severe pulmonary Hypertension, markedly elevated PVR (9.3 - 10.5) and low cardiac output / index.  -- Continue Lasix 80 mg PO BID  -- Continue home Sildenafil 40mg  tid  -- trend pro BNP  -- strict I/Os  -- Daily  weights    Acute kidney injury on CKD3 (downtrending)  On admission patient's creatinine 1.31 which appears to be baseline.  On 9/19 patient was found to have an AKI by definition of > 0.3 increase in creatinine in 48 hours.  - Renally dose medications  - Daily BMP     Type 2 diabetes mellitus with hyperglycemia and hypoglycemia  Hemoglobin A1c 8.3%.  Home regimen includes glargine 18 units nightly and sliding scale insulin.  Large fluctuations in glucose greater than 300 and occasional values ~50.  -- Sliding scale insulin insulin sensitivity factor 40  --12 glargine nightly  -- 2 units lispro tid     Hypocalcemia  Ionized calcium 4.37  -- If continues to downtrend consider supplementation with oral calcium carbonate   -- BMP    Left leg swelling  Patient endorses intermittent left > right leg swelling.  --Follow-up PVL     Chronic Problems     Chronic Pain   - Continuing home gabapentin   - PT/OT consulted, appreciate recs     Chronic pulmonary aspergillosis  Patient has history of aspergilloma and is on chronic posaconazole. Soluble IL-2 receptor, Aspergillus Galactomannan Antigen, IgE checked 10/2022 all wnl  -Continue home posaconazole Ppx  -Outpatient evaluation for [facility] scheduled for 10/25 to evaluate recurrent hemoptysis     Iron deficiency anemia  Chronic normocytic anemia (baseline hemoglobin 11)  Patient has some mild normocytic anemia with hemoglobin of 11. Iron panel shows iron of 37 with iron saturation of 15.  --Defer iron supplementation at the time given possible volume status and wanting to avoid giving fluids  --Daily CBC    Hyperlipidemia  Last lipid profile showed an LDL of 146  -- Follow-up outpatient    Issues Impacting Complexity of Management:  -The patient is at high risk for the development of complications of volume overload due to the need to provide IV hydration for suspected hypovolemia in the setting of: Pulmonary hypertension  -Need for the following intensive monitoring parameter(s) due to high risk of clinical decline: continuous oxygen monitoring, telemetry, and frequent monitoring of urine output to assess for the efficacy of diuretic regimen  -Need for intensive oxygen therapy of HFNC, which places the patient at high risk for oxygen toxicity  -The patient is at high risk of complications from pulmonary hypertension    Daily Checklist:  Diet: Regular Diet (salt restricted)  DVT PPx: Lovenox 40mg  q24h  Electrolytes: Replete Potassium to >/= 3.6 and Magnesium to >/= 1.8  Code Status: DNR and DNI  Dispo:  Continue MPCU    Team Contact Information:   Primary Team: Infectious Disease ([facility])  Primary Resident: Cydney Ok, MD,  Resident's Pager: [phone] (Infect Disease Intern - Tower)    Interval History:   No acute events overnight.    Patient without symptomatic concerns this morning. Shares continued desire to stay active to regain strength. Was questioning timing of all of her breathing treatments and how her new medication would interfere with that schedule/interact with her other medications.    ROS negative except for where otherwise noted above.    Objective:   Temp:  [36.1 ??C (97 ??F)-36.5 ??C (97.7 ??F)] 36.1 ??C (97 ??F)  Heart Rate:  [76-92] 82  SpO2 Pulse:  [76-92] 81  Resp:  [20-37] 36  BP: (88-99)/(58-81) 90/58  FiO2 (%):  [45 %] 45 %  SpO2:  [92 %-100 %] 97 %,   Intake/Output Summary (Last 24 hours) at 12/09/2022 1607  Last  data filed at 12/09/2022 1345  Gross per 24 hour   Intake 537 ml   Output 2000 ml   Net -1463 ml   , FiO2 (%):  [45 %] 45 %  O2 Device: Large bore nasal cannula (cool high flow)  O2 Flow Rate (L/min):  [6 L/min-8 L/min] 6 L/min    Gen: NAD, converses, appears comfortable on large bore nasal cannula  HENT: atraumatic, normocephalic  Heart: RRR  Lungs: CTAB, no crackles or wheezes  Abdomen: soft, NTND  Extremities: Left foot less edematous than prior exams

## 2022-12-09 NOTE — Unmapped (Signed)
Problem: Adult Inpatient Plan of Care  Goal: Plan of Care Review  12/08/2022 1741 by Ardyth Gal, RN  Outcome: Ongoing - Unchanged  12/08/2022 0924 by Ardyth Gal, RN  Outcome: Ongoing - Unchanged  Goal: Patient-Specific Goal (Individualized)  12/08/2022 1741 by Ardyth Gal, RN  Outcome: Ongoing - Unchanged  12/08/2022 0924 by Ardyth Gal, RN  Outcome: Ongoing - Unchanged  Goal: Absence of Hospital-Acquired Illness or Injury  12/08/2022 1741 by Ardyth Gal, RN  Outcome: Ongoing - Unchanged  12/08/2022 0924 by Ardyth Gal, RN  Outcome: Ongoing - Unchanged  Intervention: Identify and Manage Fall Risk  Recent Flowsheet Documentation  Taken 12/08/2022 0800 by Ardyth Gal, RN  Safety Interventions:   fall reduction program maintained   lighting adjusted for tasks/safety   low bed  Intervention: Prevent Infection  Recent Flowsheet Documentation  Taken 12/08/2022 0800 by Ardyth Gal, RN  Infection Prevention: hand hygiene promoted  Goal: Optimal Comfort and Wellbeing  12/08/2022 1741 by Ardyth Gal, RN  Outcome: Ongoing - Unchanged  12/08/2022 0924 by Ardyth Gal, RN  Outcome: Ongoing - Unchanged  Goal: Readiness for Transition of Care  12/08/2022 1741 by Ardyth Gal, RN  Outcome: Ongoing - Unchanged  12/08/2022 0924 by Ardyth Gal, RN  Outcome: Ongoing - Unchanged  Goal: Rounds/Family Conference  12/08/2022 1741 by Ardyth Gal, RN  Outcome: Ongoing - Unchanged  12/08/2022 0924 by Ardyth Gal, RN  Outcome: Ongoing - Unchanged     Problem: Fall Injury Risk  Goal: Absence of Fall and Fall-Related Injury  12/08/2022 1741 by Ardyth Gal, RN  Outcome: Ongoing - Unchanged  12/08/2022 0924 by Ardyth Gal, RN  Outcome: Ongoing - Unchanged  Intervention: Promote Injury-Free Environment  Recent Flowsheet Documentation  Taken 12/08/2022 0800 by Ardyth Gal, RN  Safety Interventions:   fall reduction program maintained   lighting adjusted for tasks/safety   low bed     Problem: Gas Exchange Impaired  Goal: Optimal Gas Exchange  12/08/2022 1741 by Ardyth Gal, RN  Outcome: Ongoing - Unchanged  12/08/2022 0924 by Ardyth Gal, RN  Outcome: Ongoing - Unchanged  Intervention: Optimize Oxygenation and Ventilation  Recent Flowsheet Documentation  Taken 12/08/2022 0800 by Ardyth Gal, RN  Head of Bed [facility]) Positioning: HOB at 30 degrees

## 2022-12-09 NOTE — Unmapped (Signed)
Problem: Adult Inpatient Plan of Care  Goal: Absence of Hospital-Acquired Illness or Injury  Outcome: Progressing  Intervention: Identify and Manage Fall Risk  Recent Flowsheet Documentation  Taken 12/08/2022 2000 by Deans, Margarita Grizzle, RN  Safety Interventions:   low bed   fall reduction program maintained   nonskid shoes/slippers when out of bed  Intervention: Prevent Skin Injury  Recent Flowsheet Documentation  Taken 12/08/2022 2000 by Laren Boom, RN  Positioning for Skin: Bed in Chair  Device Skin Pressure Protection:   absorbent pad utilized/changed   adhesive use limited   tubing/devices free from skin contact  Skin Protection:   adhesive use limited   incontinence pads utilized   transparent dressing maintained   tubing/devices free from skin contact  Intervention: Prevent and Manage VTE (Venous Thromboembolism) Risk  Recent Flowsheet Documentation  Taken 12/08/2022 2000 by Laren Boom, RN  VTE Prevention/Management: anticoagulant therapy  Intervention: Prevent Infection  Recent Flowsheet Documentation  Taken 12/08/2022 2000 by Laren Boom, RN  Infection Prevention:   hand hygiene promoted   personal protective equipment utilized   rest/sleep promoted

## 2022-12-10 LAB — PARATHYROID HORMONE (PTH): PARATHYROID HORMONE INTACT: 224.7 pg/mL — ABNORMAL HIGH (ref 18.4–80.1)

## 2022-12-10 LAB — PHOSPHORUS: PHOSPHORUS: 5.6 mg/dL — ABNORMAL HIGH (ref 2.4–5.1)

## 2022-12-10 LAB — PRO-BNP: PRO-BNP: 2731 pg/mL — ABNORMAL HIGH (ref <=300.0)

## 2022-12-10 LAB — CBC
HEMATOCRIT: 32.8 % — ABNORMAL LOW (ref 34.0–44.0)
HEMOGLOBIN: 10.4 g/dL — ABNORMAL LOW (ref 11.3–14.9)
MEAN CORPUSCULAR HEMOGLOBIN CONC: 31.8 g/dL — ABNORMAL LOW (ref 32.0–36.0)
MEAN CORPUSCULAR HEMOGLOBIN: 26.2 pg (ref 25.9–32.4)
MEAN CORPUSCULAR VOLUME: 82.1 fL (ref 77.6–95.7)
MEAN PLATELET VOLUME: 8.6 fL (ref 6.8–10.7)
PLATELET COUNT: 169 10*9/L (ref 150–450)
RED BLOOD CELL COUNT: 3.99 10*12/L (ref 3.95–5.13)
RED CELL DISTRIBUTION WIDTH: 17.1 % — ABNORMAL HIGH (ref 12.2–15.2)
WBC ADJUSTED: 4.1 10*9/L (ref 3.6–11.2)

## 2022-12-10 LAB — MAGNESIUM: MAGNESIUM: 2.2 mg/dL (ref 1.6–2.6)

## 2022-12-10 MED ADMIN — sodium chloride 3 % NEBULIZER solution 4 mL: 4 mL | RESPIRATORY_TRACT | @ 12:00:00

## 2022-12-10 MED ADMIN — gabapentin (NEURONTIN) capsule 300 mg: 300 mg | ORAL | @ 01:00:00

## 2022-12-10 MED ADMIN — sildenafiL (pulm.hypertension) (REVATIO) tablet 40 mg: 40 mg | ORAL | @ 18:00:00

## 2022-12-10 MED ADMIN — iloprost (VENTAVIS) nebulizer solution: 5 ug | RESPIRATORY_TRACT | @ 18:00:00

## 2022-12-10 MED ADMIN — insulin lispro (HumaLOG) injection 3 Units: 3 [IU] | SUBCUTANEOUS | @ 20:00:00

## 2022-12-10 MED ADMIN — budesonide (PULMICORT) nebulizer solution 0.5 mg: .5 mg | RESPIRATORY_TRACT | @ 12:00:00

## 2022-12-10 MED ADMIN — gabapentin (NEURONTIN) capsule 300 mg: 300 mg | ORAL | @ 12:00:00

## 2022-12-10 MED ADMIN — azithromycin (ZITHROMAX) tablet 250 mg: 250 mg | ORAL | @ 12:00:00 | Stop: 2025-08-25

## 2022-12-10 MED ADMIN — magnesium oxide-Mg AA chelate (Magnesium Plus Protein) 2 tablet: 2 | ORAL | @ 01:00:00

## 2022-12-10 MED ADMIN — sildenafiL (pulm.hypertension) (REVATIO) tablet 40 mg: 40 mg | ORAL | @ 01:00:00

## 2022-12-10 MED ADMIN — calcium acetate(phosphat bind) (PHOSLO) capsule 667 mg: 667 mg | ORAL | @ 20:00:00

## 2022-12-10 MED ADMIN — insulin lispro (HumaLOG) injection 0-20 Units: 0-20 [IU] | SUBCUTANEOUS | @ 01:00:00

## 2022-12-10 MED ADMIN — magnesium oxide-Mg AA chelate (Magnesium Plus Protein) 2 tablet: 2 | ORAL | @ 12:00:00

## 2022-12-10 MED ADMIN — enoxaparin (LOVENOX) syringe 40 mg: 40 mg | SUBCUTANEOUS | @ 01:00:00

## 2022-12-10 MED ADMIN — sildenafiL (pulm.hypertension) (REVATIO) tablet 40 mg: 40 mg | ORAL | @ 12:00:00

## 2022-12-10 MED ADMIN — arformoterol (BROVANA) nebulizer solution 15 mcg/2 mL: 15 ug | RESPIRATORY_TRACT | @ 12:00:00

## 2022-12-10 MED ADMIN — iloprost (VENTAVIS) nebulizer solution: 5 ug | RESPIRATORY_TRACT | @ 22:00:00

## 2022-12-10 MED ADMIN — insulin lispro (HumaLOG) injection 0-20 Units: 0-20 [IU] | SUBCUTANEOUS | @ 20:00:00

## 2022-12-10 MED ADMIN — ipratropium-albuterol (DUO-NEB) 0.5-2.5 mg/3 mL nebulizer solution 3 mL: 3 mL | RESPIRATORY_TRACT | @ 12:00:00

## 2022-12-10 MED ADMIN — ipratropium-albuterol (DUO-NEB) 0.5-2.5 mg/3 mL nebulizer solution 3 mL: 3 mL | RESPIRATORY_TRACT | @ 18:00:00

## 2022-12-10 MED ADMIN — gabapentin (NEURONTIN) capsule 300 mg: 300 mg | ORAL | @ 18:00:00

## 2022-12-10 MED ADMIN — iloprost (VENTAVIS) nebulizer solution: 2.5 ug | RESPIRATORY_TRACT | @ 02:00:00

## 2022-12-10 MED ADMIN — posaconazole (NOXAFIL) delayed released tablet 200 mg: 200 mg | ORAL | @ 12:00:00

## 2022-12-10 MED ADMIN — insulin glargine (LANTUS) injection 12 Units: 12 [IU] | SUBCUTANEOUS | @ 01:00:00

## 2022-12-10 MED ADMIN — iloprost (VENTAVIS) nebulizer solution: 2.5 ug | RESPIRATORY_TRACT | @ 05:00:00 | Stop: 2022-12-10

## 2022-12-10 MED ADMIN — insulin lispro (HumaLOG) injection 3 Units: 3 [IU] | SUBCUTANEOUS | @ 15:00:00

## 2022-12-10 MED ADMIN — furosemide (LASIX) tablet 80 mg: 80 mg | ORAL | @ 10:00:00 | Stop: 2022-12-10

## 2022-12-10 MED ADMIN — calcium carbonate tablet 600 mg elem calcium: 600 mg | ORAL | @ 18:00:00

## 2022-12-10 MED ADMIN — iloprost (VENTAVIS) nebulizer solution: 2.5 ug | RESPIRATORY_TRACT | @ 14:00:00 | Stop: 2022-12-10

## 2022-12-10 NOTE — Unmapped (Signed)
Infectious Disease ([facility]) Progress Note    Assessment & Plan:   Karen Barber is a 57 y.o. female whose presentation is complicated by bronchiectasis type 1 in the setting of pulmonary sarcoidosis (6L [place] home O2), HTN, T2DM, COPD, s/p LUL lobectomy, and asthma that presented to [facility] admitted to MICU after becoming hypoxic after holding home meds prior to PET scan. After 1 week after diuresing ICU stabilized patient she was transferred to the floor.    Principal Problem:    Acute on chronic hypoxic respiratory failure (CMS-HCC)  Active Problems:    Pulmonary sarcoidosis (CMS-HCC)    Type 2 diabetes mellitus with hyperglycemia (CMS-HCC)    COPD (chronic obstructive pulmonary disease) (CMS-HCC)    Chronic pulmonary aspergillosis (CMS-HCC)    Pulmonary hypertension (CMS-HCC)    AKI (acute kidney injury) (CMS-HCC)    Active Problems  Acute on Chronic Hypoxemic Respiratory Failure -   Pulmonary Sarcoidosis -   COPD/asthma -   Bronchiectasis  Patient has notable history of pulmonary sarcoidosis, COPD, pulmonary hypertension and is on baseline oxygen 6 L.  Recent admission with pneumonia growing Serratia marcescens  s/p 1x Rocephin (8/18), Vancomycin (8/18-8/19), Zosyn (8/18-8/23) discharged on levofloxacin 500 mg ending on 8/31.  Patient held her medications only for the morning prior to an outpatient PET CT scan during which she became hypoxic to the 70s and was admitted to the MICU.  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 She received one dose of 125mg  of IV methylprednisolone. She was weaned from high flow nasal cannula to large bore nasal cannula after IV diuresis and a course of antibiotics for possible pneumonia with Vanco/cefepime later transition to levofloxacin with a total 1 week treatment with antibiotics. Started on vanc/cefepime for broad coverage --> stopped vanc and cefepime 9/12- then levaquin (9/12---9/16).  Patient was transferred to the floor on 9/18 after showing significant signs of improvement but still having occasional desaturations with ambulation. PET scan 9/18 is not indicative of active pulmonary sarcoidosis.  Acute exacerbation likely multifactorial including in not taking her home medications as per directed for airway clearance, volume overload, and higher O2 requirement chronically than the amount she has been using. RHC 9/20 showing significant pulm HTN disease with mean PA pressure of 47, for which pulmonology team started Iloprost. Notably, wedge pressure on that study was 5, which is reassuring against volume as driver of her respiratory status at that time.  -- Consult pulmonology, appreciate recommendations  -- Continue home airway clearance (DuoNebs and HTS 3 times daily with [place])  -- Continue home Pulmicort, Brovana, Atrovent, as needed albuterol, and azithromycin  -- Lasix PO 80 mg daily  -- Wean O2 as tolerated goal SpO2 greater than 90%  -- 6-minute walk test   -- PT and OT consult     Pulmonary hypertension - RV failure - orthostatic hypotension  Patient has history of pulmonary hypertension due to pulmonary sarcoidosis/COPD.  TTE this admission 9/17 with severely dilated right atrium with severe pulmonary hypertension PASP 86 mmHg.  Estimated right atrial pressure 5 to 10 mmHg.  On Lasix 80 mg twice daily at home.  Echo showed LVEF greater than 55% and severe pulmonary hypertension no significant change since prior study.  Right heart cath showed severe pulmonary Hypertension, markedly elevated PVR (9.3 - 10.5) and low cardiac output / index.  -- Increase inhaled iloprost to 5 mcg 6 times a day q4h; please do not mix with other nebulizer solutions   -- Lasix 80 mg  daily   -- Continue home Sildenafil 40mg  tid  -- trend pro BNP  -- strict I/Os  -- Daily weights    Acute kidney injury on CKD3 (downtrending)  On admission patient's creatinine 1.31 which appears to be baseline.  On 9/19 patient was found to have an AKI by definition of > 0.3 increase in creatinine in 48 hours.  -- Renally dose medications  -- Daily BMP    Secondary Hyperparathyroidism, Hypocalcemia, and Hyperphosphatemia  Findings are consistent with secondary hyperparathyroidism due to CKD.  Ionized calcium: 4.37 mg/dL (low)  Phosphate: 5.6 mg/dL (elevated)  PTH: 387.5 pg/mL (elevated)  --start oral calcium carbonate 1600 mg TID  --start phoslo 667 mg PO TID   --consider Start calcitriol 0.25-0.5 mcg daily pending on vitamin D levels.  --follow up a 25-hydroxy vitamin D level to guide further supplementation.  --Reassess PTH levels in 4-6 weeks.    Type 2 diabetes mellitus with hyperglycemia and hypoglycemia  Hemoglobin A1c 8.3%.  Home regimen includes glargine 18 units nightly and sliding scale insulin.  Large fluctuations in glucose greater than 300 and occasional values ~50.  -- Sliding scale insulin insulin sensitivity factor 40  --12 glargine nightly  -- 3 units lispro tid     Left leg swelling  Patient endorses intermittent left > right leg swelling. Venous doppler unremarkable.      Chronic Problems     Chronic Pain   - Continuing home gabapentin   - PT/OT consulted, appreciate recs     Chronic pulmonary aspergillosis  Patient has history of aspergilloma and is on chronic posaconazole. Soluble IL-2 receptor, Aspergillus Galactomannan Antigen, IgE checked 10/2022 all wnl  -Continue home posaconazole Ppx  -Outpatient evaluation for [facility] scheduled for 10/25 to evaluate recurrent hemoptysis     Iron deficiency anemia  Chronic normocytic anemia (baseline hemoglobin 11)  Patient has some mild normocytic anemia with hemoglobin of 11. Iron panel shows iron of 37 with iron saturation of 15.   --Defer iron supplementation at the time given possible volume status and wanting to avoid giving fluids  --Daily CBC    Hyperlipidemia  Last lipid profile showed an LDL of 146  -- Follow-up outpatient    Issues Impacting Complexity of Management:  -The patient is at high risk for the development of complications of volume overload due to the need to provide IV hydration for suspected hypovolemia in the setting of: Pulmonary hypertension  -Need for the following intensive monitoring parameter(s) due to high risk of clinical decline: continuous oxygen monitoring, telemetry, and frequent monitoring of urine output to assess for the efficacy of diuretic regimen  -Need for intensive oxygen therapy of HFNC, which places the patient at high risk for oxygen toxicity  -The patient is at high risk of complications from pulmonary hypertension    Daily Checklist:  Diet: Regular Diet (salt restricted)  DVT PPx: Lovenox 40mg  q24h  Electrolytes: Replete Potassium to >/= 3.6 and Magnesium to >/= 1.8  Code Status: DNR and DNI  Dispo:  Continue MPCU    Team Contact Information:   Primary Team: Infectious Disease ([facility])  Primary Resident: Conception Chancy, MD,  Resident's Pager: [phone] (Infect Disease Intern - Tower)    Interval History:   No acute events overnight.    Patient endorses some orthostatic hypotension. Patient says she has been working with PT and looks forward to getting stronger.    ROS negative except for where otherwise noted above.    Objective:   Temp:  [  36.1 ??C (97 ??F)-36.6 ??C (97.9 ??F)] 36.3 ??C (97.3 ??F)  Heart Rate:  [67-89] 67  SpO2 Pulse:  [67-91] 67  Resp:  [8-36] 8  BP: (90-101)/(54-70) 92/54  FiO2 (%):  [45 %] 45 %  SpO2:  [85 %-100 %] 96 %,   Intake/Output Summary (Last 24 hours) at 12/10/2022 1610  Last data filed at 12/10/2022 0400  Gross per 24 hour   Intake 827 ml   Output 1700 ml   Net -873 ml   , FiO2 (%):  [45 %] 45 %  O2 Device: Large bore nasal cannula (cool high flow)  O2 Flow Rate (L/min):  [6 L/min-8 L/min] 6 L/min    Gen: NAD, converses, appears comfortable on large bore nasal cannula  HENT: atraumatic, normocephalic  Heart: RRR  Lungs: CTAB, no crackles or wheezes  Abdomen: soft, NTND  Extremities: no edema

## 2022-12-10 NOTE — Unmapped (Signed)
Pulmonary Consult Service  Consult Follow Up Note      Primary Service:   Medicine  Primary Service Attending:  Leveda Anna, MD  Reason for Consult:   Assistance with diuresis    ASSESSMENT and PLAN     Karen Barber is a 57 y.o. 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 Pt was found to have acute on chronic respiratory failure (sat in 70s on home 6L) in the setting of not taking her home medications (sildenafil, lasix, nebs, inhalers) in preparation for PET scan, admitted 9/11. She was admitted to the ICU initially, has since been weaned back to 7-9L [place] with airway clearance and diuresis.     Her acute on chronic hypoxemia and increase O2 requirement is likely multifactorial -- combination of volume overload as well as lack of airway clearance at home. She also likely has higher O2 requirement chronically than the amount she has been using at home.     Acute on Chronic Hypoxemic Respiratory Failure  COPD/Asthma  Pulmonary Sarcoidosis  Bronchiectasis s/s sarcoid  PET/CT scan was ordered prior to admission to evaluate for ongoing pulmonary sarcoidosis activity and for consideration of possible immunosuppressive agents to further treat. Soluble IL-2 receptor, Aspergillus Galactomannan Antigen, IgE checked 10/2022 all wnl. PET scan 9/17 does not appear to be active pulmonary sarcoidosis and again it is overall felt to be burned out from sarcoidosis standpoint. Currently requiring 6 L [place].   - continue home pulmicort, brovana, PRN albuterol, azithromycin  - repeat  - please cont airway clearance:   - duonebs and HTS (3%) TID with aerobika    RV Failure  PAH (Group 3, 5)  TTE 9/17 showed severe pulmonary HTN (PASP ), severely dilated RV with mildly reduced systolic function, moderately dilated RA. Likely multifactorial in the setting of known COPD, pulmonary sarcoidosis. Pro-BNP still elevated at 3,291. Since admission cumulatively -11.6 L and pt appears to be euvolemic on exam. RHC 9/20 demonstrated severe pulmonary hypertension, markedly elevated PVR (9.3-10.5) and low cardiac output/index.  - Continue sildenafil 40 mg  - Continue daily weight with strict I/O  - cont daily pro-BNP  - continue to monitor renal function, Cr still elevated above baseline  - Increase inhaled iloprost to 5 mcg q4h (order is modified); please do not mix with other nebulizer solutions  - Agree with dose reduction for furosemide (80 mg daily PO)   - Most likely we will transition to Tyvaso nebs on discharge (unavailable during inpatient). Will start this process on Monday with [facility] clinic RN.     Chronic Pulmonary Aspergillosis   Aspergillus Galactomannan Antigen, IgE checked 10/2022 all wnl.   - continue home posaconazole   - pt has outpt evaluation for [facility] scheduled for 10/25 for history of recurrent hemoptysis    Ms. Toops was seen, examined and discussed with  Dr. Ala Dach  Thank you for involving [place] in her care. We look forward to following with you.  Recommendations were communicated to the primary team at 10:14 AM  Please page [place] with any questions or concerns at [phone] (pulmonary consult fellow).    _____________________  Theda Belfast, MD  Pulmonary & Critical Care Fellow      HISTORY:     History of Present Illness:  Karen Quan  Barber is a 57 y.o. female with hx pf pulmonary sarcoidosis, COPD, PAH (baseline home O2 6L), chronic pulmonary aspergillosis, s/p LUL lobectomy 2016 who initially presented to the hospital for a PET scan to evaluate her sarcoidosis.     Pt was found to have acute on chronic respiratory failure (sat in 70s on home 6L) in the setting of not taking her home medications (sildenafil, lasix, nebs, inhalers) in preparation for PET scan. As her respiratory status did not improve with NRB, placed on HFNC. 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 She received one dose of 125mg  of IV methylprednisolone and then was transferred to the MICU for further management of her hypoxia. Pt had a week long stay in the MICU and was weaned from HFNC to [facility] after IV diuresis and course of antibiotics (completed course of levaquin) for possible pneumonia. Pt has been continuing home airway clearance (Aerobika and HTS neb), and home pulmicort, brovana, atrovent, and PRN albuterol. Showing significant signs of improvement but still having desaturations with ambulation.     Of note, has been hospitalized twice prior to this in the past 6 weeks.  8/18-8/25: presented with increased O2 requirement, increased swelling, brown sputum production.   - treated for serratia pneumonia (completed levaquin)  - Was discharged on AVAPS for home, as well as with HTS/albuterol and [place].  10/21/22-10/25/22: presented with DOE, up to 8-10L with exertion and desats to 70%.   - treated with 14 day course augmentin  - trialed steroids, ultimately discontinued    Interval Hx: Pt started iloprost yesterday evening and has tolerated it well [place] far.     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 A.niger from sputum 08/2016-09/2016.    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    Sarcoidosis 1995 Type 2 diabetes mellitus with hyperglycemia (CMS-HCC) 07/27/2014    Visual impairment     glasses     Past Surgical History:   Procedure Laterality Date    LUNG LOBECTOMY      PR AMPUTATION TOE,MT-P JT Right 12/12/2019    Procedure: Right foot fifth toe amputation at the metatarsophalangeal joint level;  Surgeon: Britt Bottom, DPM;  Location: MAIN OR [facility];  Service: Vascular    PR AMPUTATION TOE,MT-P JT Right 06/16/2020    Procedure: Right foot amputation of 3rd toe at mpj level;  Surgeon: Britt Bottom, DPM;  Location: MAIN OR [facility];  Service: Vascular    PR RIGHT HEART CATH O2 SATURATION & CARDIAC OUTPUT N/A 04/29/2019    Procedure: Right Heart Catheterization;  Surgeon: Rosana Hoes, MD;  Location: [facility] CATH;  Service: Cardiology    PR THORACOSCOPY SURG TOT PULM DECORT Left 12/14/2014    Procedure: THORACOSCOPY SURG;  W/TOT PULM DECORTIC/PNEUMOLYS;  Surgeon: Evert Kohl, MD;  Location: MAIN OR [facility];  Service: Cardiothoracic    PR THORACOSCOPY W/THERA WEDGE RESEXN INITIAL UNILAT Left 12/03/2014    Procedure: THORACOSCOPY, SURGICAL; WITH THERAPEUTIC WEDGE RESECTION (EG, MASS, NODULE) INITIAL UNILATERAL;  Surgeon: Evert Kohl, MD;  Location: MA[facility];  Service: Cardiothoracic       Other History:  Family History   Problem Relation Age of Onset    Dementia Mother     Diabetes Father     Diabetes Brother     Diabetes Maternal Aunt     Sarcoidosis Maternal Aunt     Diabetes Maternal Uncle     Diabetes Paternal Aunt     Diabetes Paternal Uncle      Social History     Socioeconomic History    Marital status: Single   Tobacco Use    Smoking status: Former     Current packs/day: 0.00     Average packs/day: 0.5 packs/day for 9.0 years (4.5 ttl pk-yrs)     Types: Cigarettes     Start date: 05/02/1978     Quit date: 05/03/1987     Years since quitting: 35.6    Smokeless tobacco: Never   Vaping Use    Vaping status: Never Used   Substance and Sexual Activity    Alcohol use: No     Alcohol/week: 0.0 standard drinks of alcohol    Drug use: No    Sexual activity: Yes   Other Topics Concern    Do you use sunscreen? No    Tanning bed use? No    Are you easily burned? No    Excessive sun exposure? No    Blistering sunburns? No     Social Determinants of Health     Financial Resource Strain: Low Risk  (11/06/2022)    Overall Financial Resource Strain (CARDIA)     Difficulty of Paying Living Expenses: Not hard at all   Food Insecurity: No Food Insecurity (11/06/2022)    Hunger Vital Sign     Worried About Running Out of Food in the Last Year: Never true     Ran Out of Food in the Last Year: Never true   Transportation Needs: No Transportation Needs (11/06/2022)    PRAPARE - Therapist, art (Medical): No     Lack of Transportation (Non-Medical): No       Home Medications:  No current facility-administered medications on file prior to encounter.     Current Outpatient Medications on File Prior to Encounter   Medication Sig Dispense Refill    ACCU-CHEK GUIDE GLUCOSE METER Misc 1 Device by Other route Four (4) times a day (before meals and nightly). AS DIRECTED 1 kit 0    albuterol 2.5 mg /3 mL (0.083 %) nebulizer solution Inhale 3 mL (2.5 mg total) by nebulization every six (6) hours as needed for wheezing or shortness of breath (airway clearance/cough). 360 mL 3    albuterol HFA 90 mcg/actuation inhaler Inhale 2 puffs every four (4) hours as needed for wheezing or shortness of breath. 54 g 0    arformoterol (BROVANA) 15 mcg/2 mL Inhale 2 mL (15 mcg total) by nebulization in the morning and 2 mL (15 mcg total) in the evening. 120 mL 2    aspirin-caffeine (BAYER BACK AND BODY) 500-32.5 mg Tab Take by mouth. Takes 2 1/2 pills 3-4 times a day as needed.  azithromycin (ZITHROMAX) 250 MG tablet Take 1 tablet (250 mg total) by mouth daily. Start on 9/1 after completing the levofloxacin 90 tablet 0    blood sugar diagnostic Strp by Other route Three (3) times a day. ACCU-CHEK Guide meter 300 strip 1    budesonide (PULMICORT) 0.5 mg/2 mL nebulizer solution Inhale 2 mL (0.5 mg total) by nebulization in the morning and 2 mL (0.5 mg total) in the evening. 120 mL 2    furosemide (LASIX) 80 MG tablet Take 1 tablet (80 mg total) by mouth two (2) times a day. Take a third dose as needed for increasing weight or increasing swelling. 60 tablet 0    gabapentin (NEURONTIN) 300 MG capsule Take 1 capsule (300 mg total) by mouth Three (3) times a day. 270 capsule 3    insulin aspart (NOVOLOG U-100 INSULIN ASPART) 100 unit/mL vial Inject 0.01-0.2 mL (1-20 Units total) under the skin Four (4) times a day (before meals and nightly). SLIDING SCALE 20 mL 1    insulin glargine (BASAGLAR KWIKPEN U-100 INSULIN) 100 unit/mL (3 mL) injection pen Inject 0.18 mL (18 Units total) under the skin nightly. 15 mL 0    ipratropium (ATROVENT) 0.02 % nebulizer solution Inhale 2.5 mL (500 mcg total) by nebulization four (4) times a day. 300 mL 0    posaconazole (NOXAFIL) 100 mg delayed released tablet Take 2 tablets by mouth once daily 60 tablet 0    sildenafiL, pulm.hypertension, (REVATIO) 20 mg tablet Take 1 tablet (20 mg total) by mouth Three (3) times a day. 90 tablet 0    OXYGEN-AIR DELIVERY SYSTEMS MISC Dose: 2-3 LPM         In-Hospital Medications:  Scheduled:   arformoterol  15 mcg Nebulization BID (RT)    azithromycin  250 mg Oral Daily    budesonide  0.5 mg Nebulization BID (RT)    calcium acetate(phosphat bind)  667 mg Oral 3xd Meals    calcium carbonate  600 mg elem calcium Oral TID    enoxaparin (LOVENOX) injection  40 mg Subcutaneous Nightly    [START ON 12/11/2022] furosemide  80 mg Oral Daily    gabapentin  300 mg Oral TID    iloprost  5 mcg Nebulization 6XD    insulin glargine  12 Units Subcutaneous Nightly    insulin lispro  0-20 Units Subcutaneous ACHS    insulin lispro  3 Units Subcutaneous 3xd Meals    ipratropium-albuterol  3 mL Nebulization TID (RT)    magnesium oxide-Mg AA chelate  2 tablet Oral BID    polyethylene glycol 17 g Oral Daily    posaconazole  200 mg Oral Daily    sildenafiL (pulm.hypertension)  40 mg Oral TID    sodium chloride  4 mL Nebulization BID (RT)     Continuous Infusions:    PRN Medications:  acetaminophen, albuterol, calcium carbonate, dextrose in water, glucagon, ondansetron **OR** ondansetron, senna, sodium chloride    Allergies:  Allergies as of 11/29/2022    (No Known Allergies)         PHYSICAL EXAM:   BP 108/73  - Pulse 89  - Temp 36.1 ??C (97 ??F) (Oral)  - Resp 24  - Ht 180.3 cm (5' 11)  - Wt 85.3 kg (188 lb 0.8 oz)  - LMP  (LMP Unknown)  - SpO2 (!) 88%  - BMI 26.23 kg/m??   General: Alert, well-appearing, and in no distress.  Eyes: Anicteric sclera, conjunctiva clear.  ENT:  Nares  normal, septum midline, mucosa normal, no drainage or sinus tenderness.  Cardiovascular: Regular rate and rhythm.  Musculoskeletal: No clubbing and no synovitis.    Skin: No rashes or lesions., Mild swelling, L>R  Neuro: No focal neurological deficits.     LABORATORY and RADIOLOGY DATA:     Pertinent Laboratory Data:  Lab Results   Component Value Date    WBC 4.1 12/10/2022    HGB 10.4 (L) 12/10/2022    HCT 32.8 (L) 12/10/2022    PLT 169 12/10/2022       Lab Results   Component Value Date    NA 139 12/09/2022    K 4.4 12/09/2022    CL 101 12/09/2022    CO2 35.0 (H) 12/09/2022    BUN 35 (H) 12/09/2022    CREATININE 1.51 (H) 12/09/2022    GLU 112 12/09/2022    CALCIUM 9.8 12/09/2022    MG 2.2 12/10/2022    PHOS 5.6 (H) 12/10/2022       Lab Results   Component Value Date    BILITOT 0.4 12/04/2022    BILIDIR <0.10 05/30/2019    PROT 6.1 12/04/2022    ALBUMIN 2.8 (L) 12/04/2022    ALT 8 (L) 12/04/2022    AST 8 12/04/2022    ALKPHOS 63 12/04/2022    GGT 60 (H) 05/31/2011       Lab Results   Component Value Date    INR 1.02 12/11/2019    APTT 30.9 12/11/2019       Pertinent Micro Data:  Microbiology Results (last day)       ** No results found for the last 24 hours. **             Pertinent Imaging Data:  9/11 CXR   - Extensive fibrotic changes and biapical bulla appear grossly unchanged. Extensive pulmonary fibrosis limits evaluation for superimposed infection.     9/17 Echo    Summary    1. Limited study.    2. The left ventricle is normal in size with normal wall thickness.    3. The left ventricular systolic function is normal, LVEF is visually  estimated at > 55%.    4. The right ventricle is severely dilated in size, with mildly reduced  systolic function.    5. There is severe pulmonary hypertension.    6. TR maximum velocity: 4.4 m/s  Estimated PASP: 86 mmHg.    7. IVC size and inspiratory change suggest mildly elevated right atrial  pressure. (5-10 mmHg).    8. No significant change since prior study from 10/23/2022    9/87 PET CT  Impression   Bulky, partially calcified lymph nodes in the mediastinum and bilateral hilar with no significant to mild uptake, decreased in intensity since prior.   Grossly unchanged metabolically active right supraclavicular lymph nodes, accounting for differences in positioning of the arms and lack of IV contrast.   Bronchiectasis and partially metabolically active reticular opacities are seen bilaterally, most prominent in the lower lobes, overall similar to prior.   Dilated main pulmonary artery measuring up to 4.0 cm, suggestive of pulmonary arterial hypertension.     9/20 RHC  FINAL CARDIAC CATHETERIZATION REPORT    CONCLUSIONS:  - Severe pulmonary Hypertension  - Markedly elevated PVR (9.3 - 10.5)  - Low cardiac output / index

## 2022-12-10 NOTE — Unmapped (Signed)
Pulmonary Consult Service  Consult Follow Up Note      Primary Service:   Medicine  Primary Service Attending:  Leveda Anna, MD  Reason for Consult:   Assistance with diuresis    ASSESSMENT and PLAN     Karen Barber is a 57 y.o. 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 Pt was found to have acute on chronic respiratory failure (sat in 70s on home 6L) in the setting of not taking her home medications (sildenafil, lasix, nebs, inhalers) in preparation for PET scan, admitted 9/11. She was admitted to the ICU initially, has since been weaned back to 7-9L [place] with airway clearance and diuresis.     Her acute on chronic hypoxemia and increase O2 requirement is likely multifactorial -- combination of volume overload as well as lack of airway clearance at home. She also likely has higher O2 requirement chronically than the amount she has been using at home.     Acute on Chronic Hypoxemic Respiratory Failure  COPD/Asthma  Pulmonary Sarcoidosis  Bronchiectasis s/s sarcoid  PET/CT scan was ordered prior to admission to evaluate for ongoing pulmonary sarcoidosis activity and for consideration of possible immunosuppressive agents to further treat. Soluble IL-2 receptor, Aspergillus Galactomannan Antigen, IgE checked 10/2022 all wnl. PET scan 9/17 does not appear to be active pulmonary sarcoidosis and again it is overall felt to be burned out from sarcoidosis standpoint. Currently requiring 6 L [place].   - continue home pulmicort, brovana, PRN albuterol, azithromycin  - repeat  - please cont airway clearance:   - duonebs and HTS (3%) TID with aerobika    RV Failure  PAH (Group 3, 5)  TTE 9/17 showed severe pulmonary HTN (PASP ), severely dilated RV with mildly reduced systolic function, moderately dilated RA. Likely multifactorial in the setting of known COPD, pulmonary sarcoidosis. Pro-BNP still elevated at 3,291. Since admission cumulatively -11.6 L and pt appears to be euvolemic on exam. RHC 9/20 demonstrated severe pulmonary hypertension, markedly elevated PVR (9.3-10.5) and low cardiac output/index.  - Continue sildenafil 40 mg  - Continue inhaled iloprost 2.5 mcg q4h; please do not mix with other nebulizer solutions  - Per patient she has only tried powder tyvaso in the past, so if she tolerates iloprost well then plan to start nebulized tyvaso at discharge  - Continue daily weight with strict I/O  - cont daily pro-BNP  - continue to monitor renal function, Cr still elevated above baseline  - continue home diuretics (80 mg BID) since her wedge pressure is 5    Chronic Pulmonary Aspergillosis   Aspergillus Galactomannan Antigen, IgE checked 10/2022 all wnl.   - continue home posaconazole   - pt has outpt evaluation for [facility] scheduled for 10/25 for history of recurrent hemoptysis    Karen Barber was seen, examined and discussed with  Dr. Ala Dach  Thank you for involving [place] in her care. We look forward to following with you.  Recommendations were communicated to the primary team at 10:14 AM  Please page [place] with any questions or concerns at [phone] (pulmonary consult fellow).    _____________________  Benjaman Kindler, MD  Pulmonary & Critical Care Fellow      HISTORY:     History of Present Illness:  Karen Barber  is a 57 y.o. female with hx pf pulmonary sarcoidosis, COPD, PAH (baseline home O2 6L), chronic pulmonary aspergillosis, s/p LUL lobectomy 2016 who initially presented to the hospital for a PET scan to evaluate her sarcoidosis.     Pt was found to have acute on chronic respiratory failure (sat in 70s on home 6L) in the setting of not taking her home medications (sildenafil, lasix, nebs, inhalers) in preparation for PET scan. As her respiratory status did not improve with NRB, placed on HFNC. 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 She received one dose of 125mg  of IV methylprednisolone and then was transferred to the MICU for further management of her hypoxia. Pt had a week long stay in the MICU and was weaned from HFNC to [facility] after IV diuresis and course of antibiotics (completed course of levaquin) for possible pneumonia. Pt has been continuing home airway clearance (Aerobika and HTS neb), and home pulmicort, brovana, atrovent, and PRN albuterol. Showing significant signs of improvement but still having desaturations with ambulation.     Of note, has been hospitalized twice prior to this in the past 6 weeks.  8/18-8/25: presented with increased O2 requirement, increased swelling, brown sputum production.   - treated for serratia pneumonia (completed levaquin)  - Was discharged on AVAPS for home, as well as with HTS/albuterol and [place].  10/21/22-10/25/22: presented with DOE, up to 8-10L with exertion and desats to 70%.   - treated with 14 day course augmentin  - trialed steroids, ultimately discontinued    Interval Hx: Pt started iloprost yesterday evening and has tolerated it well so far.    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 A.niger from sputum 08/2016-09/2016.    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    Sarcoidosis 1995    Type 2 diabetes mellitus with hyperglycemia (CMS-HCC) 07/27/2014    Visual impairment     glasses     Past Surgical History:   Procedure Laterality Date    LUNG LOBECTOMY      PR AMPUTATION TOE,MT-P JT Right 12/12/2019    Procedure: Right foot fifth toe amputation at the metatarsophalangeal joint level;  Surgeon: Britt Bottom, DPM;  Location: MAIN OR [facility];  Service: Vascular    PR AMPUTATION TOE,MT-P JT Right 06/16/2020    Procedure: Right foot amputation of 3rd toe at mpj level;  Surgeon: Britt Bottom, DPM;  Location: MAIN OR [facility]  Service: Vascular    PR RIGHT HEART CATH O2 SATURATION & CARDIAC OUTPUT N/A 04/29/2019    Procedure: Right Heart Catheterization;  Surgeon: Rosana Hoes, MD;  Location: [facility] CATH;  Service: Cardiology    PR THORACOSCOPY SURG TOT PULM DECORT Left 12/14/2014    Procedure: THORACOSCOPY  SURG; W/TOT PULM DECORTIC/PNEUMOLYS;  Surgeon: Evert Kohl, MD;  Location: MAIN OR [facility];  Service: Cardiothoracic    PR THORACOSCOPY W/THERA WEDGE RESEXN INITIAL UNILAT Left 12/03/2014    Procedure: THORACOSCOPY, SURGICAL; WITH THERAPEUTIC WEDGE RESECTION (EG, MASS, NODULE) INITIAL UNILATERAL;  Surgeon: Evert Kohl, MD;  Location: MAIN OR [facility];  Service: Cardiothoracic       Other History:  Family History   Problem Relation Age of Onset    Dementia Mother     Diabetes Father     Diabetes Brother     Diabetes Maternal Aunt     Sarcoidosis Maternal Aunt     Diabetes Maternal Uncle     Diabetes Paternal Aunt     Diabetes Paternal Uncle      Social History     Socioeconomic History    Marital status: Single   Tobacco Use    Smoking status: Former     Current packs/day: 0.00     Average packs/day: 0.5 packs/day for 9.0 years (4.5 ttl pk-yrs)     Types: Cigarettes     Start date: 05/02/1978     Quit date: 05/03/1987     Years since quitting: 35.6    Smokeless tobacco: Never   Vaping Use    Vaping status: Never Used   Substance and Sexual Activity    Alcohol use: No     Alcohol/week: 0.0 standard drinks of alcohol    Drug use: No    Sexual activity: Yes   Other Topics Concern    Do you use sunscreen? No    Tanning bed use? No    Are you easily burned? No    Excessive sun exposure? No    Blistering sunburns? No     Social Determinants of Health     Financial Resource Strain: Low Risk  (11/06/2022)    Overall Financial Resource Strain (CARDIA)     Difficulty of Paying Living Expenses: Not hard at all   Food Insecurity: No Food Insecurity (11/06/2022)    Hunger Vital Sign     Worried About Running Out of Food in the Last Year: Never true     Ran Out of Food in the Last Year: Never true   Transportation Needs: No Transportation Needs (11/06/2022)    PRAPARE - Therapist, art (Medical): No     Lack of Transportation (Non-Medical): No       Home Medications:  No current facility-administered medications on file prior to encounter.     Current Outpatient Medications on File Prior to Encounter   Medication Sig Dispense Refill    ACCU-CHEK GUIDE GLUCOSE METER Misc 1 Device by Other route Four (4) times a day (before meals and nightly). AS DIRECTED 1 kit 0    albuterol 2.5 mg /3 mL (0.083 %) nebulizer solution Inhale 3 mL (2.5 mg total) by nebulization every six (6) hours as needed for wheezing or shortness of breath (airway clearance/cough). 360 mL 3    albuterol HFA 90 mcg/actuation inhaler Inhale 2 puffs every four (4) hours as needed for wheezing or shortness of breath. 54 g 0    arformoterol (BROVANA) 15 mcg/2 mL Inhale 2 mL (15 mcg total) by nebulization in the morning and 2 mL (15 mcg total) in the evening. 120 mL 2    aspirin-caffeine (BAYER BACK AND BODY) 500-32.5 mg Tab Take by mouth. Takes 2 1/2 pills 3-4 times a day as needed.  azithromycin (ZITHROMAX) 250 MG tablet Take 1 tablet (250 mg total) by mouth daily. Start on 9/1 after completing the levofloxacin 90 tablet 0    blood sugar diagnostic Strp by Other route Three (3) times a day. ACCU-CHEK Guide meter 300 strip 1 budesonide (PULMICORT) 0.5 mg/2 mL nebulizer solution Inhale 2 mL (0.5 mg total) by nebulization in the morning and 2 mL (0.5 mg total) in the evening. 120 mL 2    furosemide (LASIX) 80 MG tablet Take 1 tablet (80 mg total) by mouth two (2) times a day. Take a third dose as needed for increasing weight or increasing swelling. 60 tablet 0    gabapentin (NEURONTIN) 300 MG capsule Take 1 capsule (300 mg total) by mouth Three (3) times a day. 270 capsule 3    insulin aspart (NOVOLOG U-100 INSULIN ASPART) 100 unit/mL vial Inject 0.01-0.2 mL (1-20 Units total) under the skin Four (4) times a day (before meals and nightly). SLIDING SCALE 20 mL 1    insulin glargine (BASAGLAR KWIKPEN U-100 INSULIN) 100 unit/mL (3 mL) injection pen Inject 0.18 mL (18 Units total) under the skin nightly. 15 mL 0    ipratropium (ATROVENT) 0.02 % nebulizer solution Inhale 2.5 mL (500 mcg total) by nebulization four (4) times a day. 300 mL 0    posaconazole (NOXAFIL) 100 mg delayed released tablet Take 2 tablets by mouth once daily 60 tablet 0    sildenafiL, pulm.hypertension, (REVATIO) 20 mg tablet Take 1 tablet (20 mg total) by mouth Three (3) times a day. 90 tablet 0    OXYGEN-AIR DELIVERY SYSTEMS MISC Dose: 2-3 LPM         In-Hospital Medications:  Scheduled:   arformoterol  15 mcg Nebulization BID (RT)    azithromycin  250 mg Oral Daily    budesonide  0.5 mg Nebulization BID (RT)    enoxaparin (LOVENOX) injection  40 mg Subcutaneous Nightly    furosemide  80 mg Oral BID    gabapentin  300 mg Oral TID    iloprost  2.5 mcg Nebulization 6XD    insulin glargine  12 Units Subcutaneous Nightly    insulin lispro  0-20 Units Subcutaneous ACHS    insulin lispro  2 Units Subcutaneous 3xd Meals    ipratropium-albuterol  3 mL Nebulization TID (RT)    magnesium oxide-Mg AA chelate  2 tablet Oral BID    polyethylene glycol  17 g Oral Daily    posaconazole  200 mg Oral Daily    sildenafiL (pulm.hypertension)  40 mg Oral TID    sodium chloride  4 mL Nebulization BID (RT)     Continuous Infusions:    PRN Medications:  acetaminophen, albuterol, calcium carbonate, dextrose in water, glucagon, ondansetron **OR** ondansetron, senna, sodium chloride    Allergies:  Allergies as of 11/29/2022    (No Known Allergies)         PHYSICAL EXAM:   BP 101/64  - Pulse 88  - Temp 36.3 ??C (97.3 ??F) (Oral)  - Resp 26  - Ht 180.3 cm (5' 11)  - Wt 85.3 kg (188 lb 0.8 oz)  - LMP  (LMP Unknown)  - SpO2 94%  - BMI 26.23 kg/m??   General: Alert, well-appearing, and in no distress.  Eyes: Anicteric sclera, conjunctiva clear.  ENT:  Nares normal, septum midline, mucosa normal, no drainage or sinus tenderness.  Cardiovascular: Regular rate and rhythm.  Musculoskeletal: No clubbing and no synovitis.    Skin: No rashes or  lesions., Mild swelling, L>R  Neuro: No focal neurological deficits.     LABORATORY and RADIOLOGY DATA:     Pertinent Laboratory Data:  Lab Results   Component Value Date    WBC 4.4 12/09/2022    HGB 11.5 12/09/2022    HCT 36.3 12/09/2022    PLT 194 12/09/2022       Lab Results   Component Value Date    NA 139 12/09/2022    K 4.4 12/09/2022    CL 101 12/09/2022    CO2 35.0 (H) 12/09/2022    BUN 35 (H) 12/09/2022    CREATININE 1.51 (H) 12/09/2022    GLU 112 12/09/2022    CALCIUM 9.8 12/09/2022    MG 2.1 12/09/2022    PHOS 4.6 12/09/2022       Lab Results   Component Value Date    BILITOT 0.4 12/04/2022    BILIDIR <0.10 05/30/2019    PROT 6.1 12/04/2022    ALBUMIN 2.8 (L) 12/04/2022    ALT 8 (L) 12/04/2022    AST 8 12/04/2022    ALKPHOS 63 12/04/2022    GGT 60 (H) 05/31/2011       Lab Results   Component Value Date    INR 1.02 12/11/2019    APTT 30.9 12/11/2019       Pertinent Micro Data:  Microbiology Results (last day)       ** No results found for the last 24 hours. **             Pertinent Imaging Data:  9/11 CXR   - Extensive fibrotic changes and biapical bulla appear grossly unchanged. Extensive pulmonary fibrosis limits evaluation for superimposed infection.     9/17 Echo    Summary    1. Limited study.    2. The left ventricle is normal in size with normal wall thickness.    3. The left ventricular systolic function is normal, LVEF is visually  estimated at > 55%.    4. The right ventricle is severely dilated in size, with mildly reduced  systolic function.    5. There is severe pulmonary hypertension.    6. TR maximum velocity: 4.4 m/s  Estimated PASP: 86 mmHg.    7. IVC size and inspiratory change suggest mildly elevated right atrial  pressure. (5-10 mmHg).    8. No significant change since prior study from 10/23/2022    9/87 PET CT  Impression   Bulky, partially calcified lymph nodes in the mediastinum and bilateral hilar with no significant to mild uptake, decreased in intensity since prior.   Grossly unchanged metabolically active right supraclavicular lymph nodes, accounting for differences in positioning of the arms and lack of IV contrast.   Bronchiectasis and partially metabolically active reticular opacities are seen bilaterally, most prominent in the lower lobes, overall similar to prior.   Dilated main pulmonary artery measuring up to 4.0 cm, suggestive of pulmonary arterial hypertension.     9/20 RHC  FINAL CARDIAC CATHETERIZATION REPORT    CONCLUSIONS:  - Severe pulmonary Hypertension  - Markedly elevated PVR (9.3 - 10.5)  - Low cardiac output / index

## 2022-12-10 NOTE — Unmapped (Signed)
Problem: Adult Inpatient Plan of Care  Goal: Plan of Care Review  12/09/2022 1819 by Ardyth Gal, RN  Outcome: Ongoing - Unchanged  12/09/2022 1321 by Ardyth Gal, RN  Outcome: Ongoing - Unchanged  Goal: Patient-Specific Goal (Individualized)  12/09/2022 1819 by Ardyth Gal, RN  Outcome: Ongoing - Unchanged  12/09/2022 1321 by Ardyth Gal, RN  Outcome: Ongoing - Unchanged  Goal: Absence of Hospital-Acquired Illness or Injury  12/09/2022 1819 by Ardyth Gal, RN  Outcome: Ongoing - Unchanged  12/09/2022 1321 by Ardyth Gal, RN  Outcome: Ongoing - Unchanged  Intervention: Identify and Manage Fall Risk  Recent Flowsheet Documentation  Taken 12/09/2022 0800 by Ardyth Gal, RN  Safety Interventions:   fall reduction program maintained   lighting adjusted for tasks/safety   low bed  Intervention: Prevent Infection  Recent Flowsheet Documentation  Taken 12/09/2022 0800 by Ardyth Gal, RN  Infection Prevention: hand hygiene promoted  Goal: Optimal Comfort and Wellbeing  12/09/2022 1819 by Ardyth Gal, RN  Outcome: Ongoing - Unchanged  12/09/2022 1321 by Ardyth Gal, RN  Outcome: Ongoing - Unchanged  Goal: Readiness for Transition of Care  12/09/2022 1819 by Ardyth Gal, RN  Outcome: Ongoing - Unchanged  12/09/2022 1321 by Ardyth Gal, RN  Outcome: Ongoing - Unchanged  Goal: Rounds/Family Conference  12/09/2022 1819 by Ardyth Gal, RN  Outcome: Ongoing - Unchanged  12/09/2022 1321 by Ardyth Gal, RN  Outcome: Ongoing - Unchanged     Problem: Fall Injury Risk  Goal: Absence of Fall and Fall-Related Injury  12/09/2022 1819 by Ardyth Gal, RN  Outcome: Ongoing - Unchanged  12/09/2022 1321 by Ardyth Gal, RN  Outcome: Ongoing - Unchanged  Intervention: Promote Injury-Free Environment  Recent Flowsheet Documentation  Taken 12/09/2022 0800 by Ardyth Gal, RN  Safety Interventions:   fall reduction program maintained   lighting adjusted for tasks/safety   low bed     Problem: Gas Exchange Impaired  Goal: Optimal Gas Exchange  12/09/2022 1819 by Ardyth Gal, RN  Outcome: Ongoing - Unchanged  12/09/2022 1321 by Ardyth Gal, RN  Outcome: Ongoing - Unchanged  Intervention: Optimize Oxygenation and Ventilation  Recent Flowsheet Documentation  Taken 12/09/2022 0800 by Ardyth Gal, RN  Head of Bed [facility]) Positioning: HOB at 30 degrees

## 2022-12-10 NOTE — Unmapped (Addendum)
Pt A&O x4. NSR, soft BP but within parameters, afebrile. Pt on 6-8L LBNC at rest, tolerating well. No complaints of pain.   Pt on regular diet w/ adequate I&Os.   All falls & safety protocols remain in place.       Problem: Adult Inpatient Plan of Care  Goal: Plan of Care Review  Outcome: Ongoing - Unchanged  Goal: Patient-Specific Goal (Individualized)  Outcome: Ongoing - Unchanged  Goal: Absence of Hospital-Acquired Illness or Injury  Outcome: Ongoing - Unchanged  Intervention: Identify and Manage Fall Risk  Recent Flowsheet Documentation  Taken 12/10/2022 0800 by Luanne Bras, RN  Safety Interventions:   aspiration precautions   bleeding precautions   commode/urinal/bedpan at bedside   fall reduction program maintained   infection management   low bed   nonskid shoes/slippers when out of bed  Intervention: Prevent and Manage VTE (Venous Thromboembolism) Risk  Recent Flowsheet Documentation  Taken 12/10/2022 1130 by Luanne Bras, RN  Anti-Embolism Intervention: Other (Comment)  Taken 12/10/2022 1000 by Luanne Bras, RN  Anti-Embolism Intervention: (see MAR) Other (Comment)  Taken 12/10/2022 0800 by Luanne Bras, RN  Anti-Embolism Intervention: (see MAR) Other (Comment)  Taken 12/10/2022 0745 by Luanne Bras, RN  Anti-Embolism Intervention: Other (Comment)  Intervention: Prevent Infection  Recent Flowsheet Documentation  Taken 12/10/2022 0800 by Luanne Bras, RN  Infection Prevention:   environmental surveillance performed   equipment surfaces disinfected   hand hygiene promoted   personal protective equipment utilized  Goal: Optimal Comfort and Wellbeing  Outcome: Ongoing - Unchanged  Goal: Readiness for Transition of Care  Outcome: Ongoing - Unchanged  Goal: Rounds/Family Conference  Outcome: Ongoing - Unchanged     Problem: Fall Injury Risk  Goal: Absence of Fall and Fall-Related Injury  Outcome: Ongoing - Unchanged  Intervention: Promote Injury-Free Environment  Recent Flowsheet Documentation  Taken 12/10/2022 0800 by Luanne Bras, RN  Safety Interventions:   aspiration precautions   bleeding precautions   commode/urinal/bedpan at bedside   fall reduction program maintained   infection management   low bed   nonskid shoes/slippers when out of bed     Problem: Gas Exchange Impaired  Goal: Optimal Gas Exchange  Outcome: Ongoing - Unchanged  Intervention: Optimize Oxygenation and Ventilation  Recent Flowsheet Documentation  Taken 12/10/2022 1000 by Luanne Bras, RN  Head of Bed [facility]) Positioning: HOB at 30-45 degrees  Taken 12/10/2022 0800 by Luanne Bras, RN  Head of Bed [facility]) Positioning: HOB at 30-45 degrees

## 2022-12-11 LAB — CBC
HEMATOCRIT: 32.7 % — ABNORMAL LOW (ref 34.0–44.0)
HEMOGLOBIN: 10.7 g/dL — ABNORMAL LOW (ref 11.3–14.9)
MEAN CORPUSCULAR HEMOGLOBIN CONC: 32.7 g/dL (ref 32.0–36.0)
MEAN CORPUSCULAR HEMOGLOBIN: 26.8 pg (ref 25.9–32.4)
MEAN CORPUSCULAR VOLUME: 82 fL (ref 77.6–95.7)
MEAN PLATELET VOLUME: 8.6 fL (ref 6.8–10.7)
PLATELET COUNT: 166 10*9/L (ref 150–450)
RED BLOOD CELL COUNT: 3.99 10*12/L (ref 3.95–5.13)
RED CELL DISTRIBUTION WIDTH: 17.2 % — ABNORMAL HIGH (ref 12.2–15.2)
WBC ADJUSTED: 4 10*9/L (ref 3.6–11.2)

## 2022-12-11 LAB — PRO-BNP: PRO-BNP: 2747 pg/mL — ABNORMAL HIGH (ref <=300.0)

## 2022-12-11 LAB — IONIZED CALCIUM VENOUS: CALCIUM IONIZED VENOUS (MG/DL): 5.15 mg/dL (ref 4.40–5.40)

## 2022-12-11 LAB — PHOSPHORUS: PHOSPHORUS: 5 mg/dL (ref 2.4–5.1)

## 2022-12-11 LAB — MAGNESIUM: MAGNESIUM: 2.3 mg/dL (ref 1.6–2.6)

## 2022-12-11 MED ADMIN — budesonide (PULMICORT) nebulizer solution 0.5 mg: .5 mg | RESPIRATORY_TRACT | @ 14:00:00

## 2022-12-11 MED ADMIN — arformoterol (BROVANA) nebulizer solution 15 mcg/2 mL: 15 ug | RESPIRATORY_TRACT | @ 01:00:00

## 2022-12-11 MED ADMIN — calcium carbonate tablet 600 mg elem calcium: 600 mg | ORAL | @ 01:00:00

## 2022-12-11 MED ADMIN — iloprost (VENTAVIS) nebulizer solution: 5 ug | RESPIRATORY_TRACT | @ 04:00:00

## 2022-12-11 MED ADMIN — sildenafiL (pulm.hypertension) (REVATIO) tablet 40 mg: 40 mg | ORAL | @ 19:00:00

## 2022-12-11 MED ADMIN — insulin lispro (HumaLOG) injection 3 Units: 3 [IU] | SUBCUTANEOUS | @ 16:00:00

## 2022-12-11 MED ADMIN — acetaminophen (TYLENOL) tablet 650 mg: 650 mg | ORAL | @ 17:00:00 | Stop: 2022-12-11

## 2022-12-11 MED ADMIN — iloprost (VENTAVIS) nebulizer solution: 5 ug | RESPIRATORY_TRACT | @ 19:00:00

## 2022-12-11 MED ADMIN — furosemide (LASIX) tablet 80 mg: 80 mg | ORAL | @ 13:00:00 | Stop: 2022-12-11

## 2022-12-11 MED ADMIN — ipratropium-albuterol (DUO-NEB) 0.5-2.5 mg/3 mL nebulizer solution 3 mL: 3 mL | RESPIRATORY_TRACT | @ 14:00:00

## 2022-12-11 MED ADMIN — calcium acetate(phosphat bind) (PHOSLO) capsule 667 mg: 667 mg | ORAL | @ 13:00:00 | Stop: 2022-12-11

## 2022-12-11 MED ADMIN — magnesium oxide-Mg AA chelate (Magnesium Plus Protein) 2 tablet: 2 | ORAL | @ 13:00:00

## 2022-12-11 MED ADMIN — iron dextran (INFED) 1,000 mg in sodium chloride (NS) 0.9 % 500 mL IVPB: 1000 mg | INTRAVENOUS | @ 22:00:00 | Stop: 2022-12-11

## 2022-12-11 MED ADMIN — sildenafiL (pulm.hypertension) (REVATIO) tablet 40 mg: 40 mg | ORAL | @ 13:00:00

## 2022-12-11 MED ADMIN — iloprost (VENTAVIS) nebulizer solution: 5 ug | RESPIRATORY_TRACT | @ 17:00:00

## 2022-12-11 MED ADMIN — diphenhydrAMINE (BENADRYL) capsule/tablet 25 mg: 25 mg | ORAL | @ 17:00:00 | Stop: 2022-12-11

## 2022-12-11 MED ADMIN — calcium carbonate tablet 600 mg elem calcium: 600 mg | ORAL | @ 13:00:00 | Stop: 2022-12-11

## 2022-12-11 MED ADMIN — insulin glargine (LANTUS) injection 12 Units: 12 [IU] | SUBCUTANEOUS | @ 01:00:00

## 2022-12-11 MED ADMIN — ipratropium-albuterol (DUO-NEB) 0.5-2.5 mg/3 mL nebulizer solution 3 mL: 3 mL | RESPIRATORY_TRACT | @ 19:00:00

## 2022-12-11 MED ADMIN — arformoterol (BROVANA) nebulizer solution 15 mcg/2 mL: 15 ug | RESPIRATORY_TRACT | @ 14:00:00

## 2022-12-11 MED ADMIN — posaconazole (NOXAFIL) delayed released tablet 200 mg: 200 mg | ORAL | @ 13:00:00

## 2022-12-11 MED ADMIN — gabapentin (NEURONTIN) capsule 300 mg: 300 mg | ORAL | @ 13:00:00

## 2022-12-11 MED ADMIN — sodium chloride 3 % NEBULIZER solution 4 mL: 4 mL | RESPIRATORY_TRACT | @ 01:00:00

## 2022-12-11 MED ADMIN — sodium chloride 3 % NEBULIZER solution 4 mL: 4 mL | RESPIRATORY_TRACT | @ 14:00:00

## 2022-12-11 MED ADMIN — sodium chloride (NS) 0.9 % infusion: 20 mL/h | INTRAVENOUS | @ 19:00:00 | Stop: 2022-12-11

## 2022-12-11 MED ADMIN — iloprost (VENTAVIS) nebulizer solution: 5 ug | RESPIRATORY_TRACT | @ 14:00:00

## 2022-12-11 MED ADMIN — furosemide (LASIX) tablet 80 mg: 80 mg | ORAL | @ 19:00:00

## 2022-12-11 MED ADMIN — insulin lispro (HumaLOG) injection 0-20 Units: 0-20 [IU] | SUBCUTANEOUS | @ 01:00:00

## 2022-12-11 MED ADMIN — sildenafiL (pulm.hypertension) (REVATIO) tablet 40 mg: 40 mg | ORAL | @ 01:00:00

## 2022-12-11 MED ADMIN — magnesium oxide-Mg AA chelate (Magnesium Plus Protein) 2 tablet: 2 | ORAL | @ 01:00:00

## 2022-12-11 MED ADMIN — iloprost (VENTAVIS) nebulizer solution: 5 ug | RESPIRATORY_TRACT | @ 01:00:00

## 2022-12-11 MED ADMIN — gabapentin (NEURONTIN) capsule 300 mg: 300 mg | ORAL | @ 01:00:00

## 2022-12-11 MED ADMIN — insulin lispro (HumaLOG) injection 3 Units: 3 [IU] | SUBCUTANEOUS | @ 22:00:00

## 2022-12-11 MED ADMIN — enoxaparin (LOVENOX) syringe 40 mg: 40 mg | SUBCUTANEOUS | @ 01:00:00

## 2022-12-11 MED ADMIN — gabapentin (NEURONTIN) capsule 300 mg: 300 mg | ORAL | @ 19:00:00

## 2022-12-11 MED ADMIN — budesonide (PULMICORT) nebulizer solution 0.5 mg: .5 mg | RESPIRATORY_TRACT | @ 01:00:00

## 2022-12-11 MED ADMIN — azithromycin (ZITHROMAX) tablet 250 mg: 250 mg | ORAL | @ 13:00:00 | Stop: 2025-08-25

## 2022-12-11 MED ADMIN — ipratropium-albuterol (DUO-NEB) 0.5-2.5 mg/3 mL nebulizer solution 3 mL: 3 mL | RESPIRATORY_TRACT | @ 01:00:00

## 2022-12-11 MED ADMIN — acetaminophen (TYLENOL) tablet 650 mg: 650 mg | ORAL | @ 01:00:00

## 2022-12-11 MED ADMIN — iloprost (VENTAVIS) nebulizer solution: 5 ug | RESPIRATORY_TRACT | @ 22:00:00

## 2022-12-11 MED ADMIN — iron dextran (INFED) 25 mg in sodium chloride (NS) 0.9 % 25 mL IVPB: 25 mg | INTRAVENOUS | @ 18:00:00 | Stop: 2022-12-11

## 2022-12-11 NOTE — Unmapped (Signed)
Pulmonary Consult Service  Consult Follow Up Note      Primary Service:   Medicine  Primary Service Attending:  Ebony Hail, MD  Reason for Consult:   Assistance with diuresis    ASSESSMENT and PLAN     Karen Barber is a 57 y.o. 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 Pt was found to have acute on chronic respiratory failure (sat in 70s on home 6L) in the setting of not taking her home medications (sildenafil, lasix, nebs, inhalers) in preparation for PET scan, admitted 9/11. She was admitted to the ICU initially, has since been weaned back to 6-8L [place] with airway clearance and diuresis.     Her acute on chronic hypoxemia and increase O2 requirement is likely multifactorial -- combination of volume overload as well as lack of airway clearance at home, however I think chronically her O2 requirement (related to her PH) is higher than the amount she has been on at home.    Acute on Chronic Hypoxemic Respiratory Failure  COPD/Asthma  Pulmonary Sarcoidosis  Bronchiectasis s/s sarcoid  Soluble IL-2 receptor, Aspergillus Galactomannan Antigen, IgE checked 10/2022 all wnl. PET scan 9/17 does not appear to be active pulmonary sarcoidosis and again it is overall felt to be burned out from sarcoidosis standpoint. Currently requiring 6-8 L [place] and at this point it is likely she has higher O2 requirement chronically than the amount she has been using at home. This may reflect her new baseline and this was discussed with the patient.   Plan:  - continue home pulmicort, brovana, PRN albuterol, azithromycin  - plan to get oxymizer for her while admitted and repeat . Will need oxymizer at home  - please cont airway clearance:   - duonebs and HTS (3%) TID with aerobika    RV Failure  PAH (Group 3, 5)  TTE 9/17 showed severe pulmonary HTN (PASP ), severely dilated RV with mildly reduced systolic function, moderately dilated RA. Likely multifactorial in the setting of known COPD, pulmonary sarcoidosis. Pro-BNP still elevated at 3,291. Since admission cumulatively -11.6 L and pt appears to be euvolemic on exam. RHC 9/20 demonstrated severe pulmonary hypertension, markedly elevated PVR (9.3-10.5) and low cardiac output/index.  Plan:  - Plan to transition to Tyvaso nebs on discharge  (working with [facility] clinic RN)  - repeat HRCT  - Continue sildenafil 40 mg  - Continue inhaled iloprost 5 mcg q4h. Please do not mix with other nebulizer solutions  - Continue furosemide (80 mg daily PO)   - Continue daily weight with strict I/O  - cont daily pro-BNP    Chronic Pulmonary Aspergillosis   Aspergillus Galactomannan Antigen, IgE checked 10/2022 all wnl.   - continue home posaconazole   - pt has outpt evaluation for [facility] scheduled for 10/25 for history of recurrent hemoptysis    Ms. Lippens was seen, examined and discussed with  Dr. Nada Libman  Thank you for involving [place] in her care. We look forward to following with you.  Recommendations were communicated to the primary team at 10:14 AM  Please page [place] with any questions or concerns at [phone] (pulmonary consult fellow).    Audery Amel  Medical Student    Alesia Banda, MD  Pulmonary Critical Care Fellow    HISTORY:  History of Present Illness:  Karen Barber is a 57 y.o. female with hx pf pulmonary sarcoidosis, COPD, PAH (baseline home O2 6L), chronic pulmonary aspergillosis, s/p LUL lobectomy 2016 who initially presented to the hospital for a PET scan to evaluate her sarcoidosis.     Pt was found to have acute on chronic respiratory failure (sat in 70s on home 6L) in the setting of not taking her home medications (sildenafil, lasix, nebs, inhalers) in preparation for PET scan. As her respiratory status did not improve with NRB, placed on HFNC. 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 She received one dose of 125mg  of IV methylprednisolone and then was transferred to the MICU for further management of her hypoxia. Pt had a week long stay in the MICU and was weaned from HFNC to [facility] after IV diuresis and course of antibiotics (completed course of levaquin) for possible pneumonia. Pt has been continuing home airway clearance (Aerobika and HTS neb), and home pulmicort, brovana, atrovent, and PRN albuterol. Showing significant signs of improvement but still having desaturations with ambulation.     Of note, has been hospitalized twice prior to this in the past 6 weeks.  8/18-8/25: presented with increased O2 requirement, increased swelling, brown sputum production.   - treated for serratia pneumonia (completed levaquin)  - Was discharged on AVAPS for home, as well as with HTS/albuterol and [place].  10/21/22-10/25/22: presented with DOE, up to 8-10L with exertion and desats to 70%.   - treated with 14 day course augmentin  - trialed steroids, ultimately discontinued    Interval Hx: Pt started iloprost 9/19 and has tolerated it well so far.     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 A.niger from sputum 08/2016-09/2016.    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    Sarcoidosis 1995    Type 2 diabetes mellitus with hyperglycemia (CMS-HCC) 07/27/2014    Visual impairment     glasses     Past Surgical History:   Procedure Laterality Date    LUNG LOBECTOMY      PR AMPUTATION TOE,MT-P JT Right 12/12/2019    Procedure: Right foot fifth toe amputation at the metatarsophalangeal joint level;  Surgeon: Britt Bottom, DPM;  Location: MAIN OR [facility];  Service: Vascular    PR AMPUTATION TOE,MT-P JT Right 06/16/2020    Procedure: Right foot amputation of 3rd toe at mpj level;  Surgeon: Britt Bottom, DPM;  Location: MAIN OR [facility];  Service: Vascular    PR RIGHT HEART CATH O2 SATURATION & CARDIAC OUTPUT N/A 04/29/2019    Procedure: Right Heart Catheterization;  Surgeon: Rosana Hoes, MD;  Location: [facility] CATH;  Service: Cardiology    PR THORACOSCOPY SURG TOT PULM  DECORT Left 12/14/2014    Procedure: THORACOSCOPY SURG; W/TOT PULM DECORTIC/PNEUMOLYS;  Surgeon: Evert Kohl, MD;  Location: MAIN OR [facility];  Service: Cardiothoracic    PR THORACOSCOPY W/THERA WEDGE RESEXN INITIAL UNILAT Left 12/03/2014    Procedure: THORACOSCOPY, SURGICAL; WITH THERAPEUTIC WEDGE RESECTION (EG, MASS, NODULE) INITIAL UNILATERAL;  Surgeon: Evert Kohl, MD;  Location: MAIN OR [facility];  Service: Cardiothoracic       Other History:  Family History   Problem Relation Age of Onset    Dementia Mother     Diabetes Father     Diabetes Brother     Diabetes Maternal Aunt     Sarcoidosis Maternal Aunt     Diabetes Maternal Uncle     Diabetes Paternal Aunt     Diabetes Paternal Uncle      Social History     Socioeconomic History    Marital status: Single   Tobacco Use    Smoking status: Former     Current packs/day: 0.00     Average packs/day: 0.5 packs/day for 9.0 years (4.5 ttl pk-yrs)     Types: Cigarettes     Start date: 05/02/1978     Quit date: 05/03/1987     Years since quitting: 35.6    Smokeless tobacco: Never   Vaping Use    Vaping status: Never Used   Substance and Sexual Activity    Alcohol use: No     Alcohol/week: 0.0 standard drinks of alcohol    Drug use: No    Sexual activity: Yes   Other Topics Concern    Do you use sunscreen? No    Tanning bed use? No    Are you easily burned? No    Excessive sun exposure? No    Blistering sunburns? No     Social Determinants of Health     Financial Resource Strain: Low Risk  (11/06/2022)    Overall Financial Resource Strain (CARDIA)     Difficulty of Paying Living Expenses: Not hard at all   Food Insecurity: No Food Insecurity (11/06/2022)    Hunger Vital Sign     Worried About Running Out of Food in the Last Year: Never true     Ran Out of Food in the Last Year: Never true   Transportation Needs: No Transportation Needs (11/06/2022)    PRAPARE - Therapist, art (Medical): No     Lack of Transportation (Non-Medical): No       Home Medications:  No current facility-administered medications on file prior to encounter.     Current Outpatient Medications on File Prior to Encounter   Medication Sig Dispense Refill    ACCU-CHEK GUIDE GLUCOSE METER Misc 1 Device by Other route Four (4) times a day (before meals and nightly). AS DIRECTED 1 kit 0    albuterol 2.5 mg /3 mL (0.083 %) nebulizer solution Inhale 3 mL (2.5 mg total) by nebulization every six (6) hours as needed for wheezing or shortness of breath (airway clearance/cough). 360 mL 3    albuterol HFA 90 mcg/actuation inhaler Inhale 2 puffs every four (4) hours as needed for wheezing or shortness of breath. 54 g 0    arformoterol (BROVANA) 15 mcg/2 mL Inhale 2 mL (15 mcg total) by nebulization in the morning and 2 mL (15 mcg total) in the evening. 120 mL 2    aspirin-caffeine (BAYER BACK AND BODY) 500-32.5 mg Tab Take by mouth. Takes 2 1/2 pills 3-4 times  a day as needed.      azithromycin (ZITHROMAX) 250 MG tablet Take 1 tablet (250 mg total) by mouth daily. Start on 9/1 after completing the levofloxacin 90 tablet 0    blood sugar diagnostic Strp by Other route Three (3) times a day. ACCU-CHEK Guide meter 300 strip 1    budesonide (PULMICORT) 0.5 mg/2 mL nebulizer solution Inhale 2 mL (0.5 mg total) by nebulization in the morning and 2 mL (0.5 mg total) in the evening. 120 mL 2    furosemide (LASIX) 80 MG tablet Take 1 tablet (80 mg total) by mouth two (2) times a day. Take a third dose as needed for increasing weight or increasing swelling. 60 tablet 0    gabapentin (NEURONTIN) 300 MG capsule Take 1 capsule (300 mg total) by mouth Three (3) times a day. 270 capsule 3    insulin aspart (NOVOLOG U-100 INSULIN ASPART) 100 unit/mL vial Inject 0.01-0.2 mL (1-20 Units total) under the skin Four (4) times a day (before meals and nightly). SLIDING SCALE 20 mL 1    insulin glargine (BASAGLAR KWIKPEN U-100 INSULIN) 100 unit/mL (3 mL) injection pen Inject 0.18 mL (18 Units total) under the skin nightly. 15 mL 0    ipratropium (ATROVENT) 0.02 % nebulizer solution Inhale 2.5 mL (500 mcg total) by nebulization four (4) times a day. 300 mL 0    posaconazole (NOXAFIL) 100 mg delayed released tablet Take 2 tablets by mouth once daily 60 tablet 0    sildenafiL, pulm.hypertension, (REVATIO) 20 mg tablet Take 1 tablet (20 mg total) by mouth Three (3) times a day. 90 tablet 0    OXYGEN-AIR DELIVERY SYSTEMS MISC Dose: 2-3 LPM         In-Hospital Medications:  Scheduled:   arformoterol  15 mcg Nebulization BID (RT)    azithromycin  250 mg Oral Daily    budesonide  0.5 mg Nebulization BID (RT)    enoxaparin (LOVENOX) injection  40 mg Subcutaneous Nightly    furosemide  80 mg Oral BID    gabapentin  300 mg Oral TID    iloprost  5 mcg Nebulization 6XD    insulin glargine  12 Units Subcutaneous Nightly    insulin lispro  0-20 Units Subcutaneous ACHS    insulin lispro  3 Units Subcutaneous 3xd Meals    ipratropium-albuterol  3 mL Nebulization TID (RT)    magnesium oxide-Mg AA chelate  2 tablet Oral BID    polyethylene glycol  17 g Oral Daily    posaconazole  200 mg Oral Daily    sildenafiL (pulm.hypertension)  40 mg Oral TID    sodium chloride  4 mL Nebulization BID (RT)     Continuous Infusions:    PRN Medications:  acetaminophen, albuterol, calcium carbonate, dextrose in water, glucagon, ondansetron **OR** ondansetron, senna, sodium chloride    Allergies:  Allergies as of 11/29/2022    (No Known Allergies)         PHYSICAL EXAM:   BP 98/60  - Pulse 76  - Temp 36.4 ??C (97.5 ??F) (Oral)  - Resp 13  - Ht 180.3 cm (5' 11)  - Wt 86.6 kg (190 lb 14.7 oz)  - LMP  (LMP Unknown)  - SpO2 93%  - BMI 26.63 kg/m??   General: Alert, well-appearing, and in no distress.  Eyes: Anicteric sclera, conjunctiva clear.  ENT:   Nares normal, septum midline, mucosa normal, no drainage.  Cardiovascular: Regular rate and rhythm.  Pulmonary: Diminished lung  sounds in bilateral upper lobes. Diffuse crackles/wheezing throughout bilateral lungs  Musculoskeletal: No clubbing and no synovitis.    Skin: No rashes or lesions., Mild swelling, L>R  Neuro: No focal neurological deficits.    LABORATORY and RADIOLOGY DATA:     Pertinent Laboratory Data:  Lab Results   Component Value Date    WBC 4.0 12/11/2022    HGB 10.7 (L) 12/11/2022    HCT 32.7 (L) 12/11/2022    PLT 166 12/11/2022       Lab Results   Component Value Date    NA 139 12/09/2022    K 4.4 12/09/2022    CL 101 12/09/2022    CO2 35.0 (H) 12/09/2022    BUN 35 (H) 12/09/2022    CREATININE 1.51 (H) 12/09/2022    GLU 112 12/09/2022    CALCIUM 9.8 12/09/2022    MG 2.3 12/11/2022    PHOS 5.0 12/11/2022       Lab Results   Component Value Date    BILITOT 0.4 12/04/2022    BILIDIR <0.10 05/30/2019    PROT 6.1 12/04/2022    ALBUMIN 2.8 (L) 12/04/2022    ALT 8 (L) 12/04/2022    AST 8 12/04/2022    ALKPHOS 63 12/04/2022    GGT 60 (H) 05/31/2011       Lab Results   Component Value Date    INR 1.02 12/11/2019    APTT 30.9 12/11/2019       Pertinent Micro Data:  Microbiology Results (last day)       ** No results found for the last 24 hours. **             Pertinent Imaging Data:  9/11 CXR   - Extensive fibrotic changes and biapical bulla appear grossly unchanged. Extensive pulmonary fibrosis limits evaluation for superimposed infection.     9/17 Echo    Summary    1. Limited study.    2. The left ventricle is normal in size with normal wall thickness.    3. The left ventricular systolic function is normal, LVEF is visually  estimated at > 55%.    4. The right ventricle is severely dilated in size, with mildly reduced  systolic function.    5. There is severe pulmonary hypertension.    6. TR maximum velocity: 4.4 m/s  Estimated PASP: 86 mmHg.    7. IVC size and inspiratory change suggest mildly elevated right atrial  pressure. (5-10 mmHg).    8. No significant change since prior study from 10/23/2022    9/18 PET CT  Impression   Bulky, partially calcified lymph nodes in the mediastinum and bilateral hilar with no significant to mild uptake, decreased in intensity since prior.   Grossly unchanged metabolically active right supraclavicular lymph nodes, accounting for differences in positioning of the arms and lack of IV contrast.   Bronchiectasis and partially metabolically active reticular opacities are seen bilaterally, most prominent in the lower lobes, overall similar to prior.   Dilated main pulmonary artery measuring up to 4.0 cm, suggestive of pulmonary arterial hypertension.     9/20 RHC  FINAL CARDIAC CATHETERIZATION REPORT    CONCLUSIONS:  - Severe pulmonary Hypertension  - Markedly elevated PVR (9.3 - 10.5)  - Low cardiac output / index

## 2022-12-11 NOTE — Unmapped (Signed)
Tyvaso enrollment sent to [facility]. Awaiting HRCT for PH-ILD diagnosis.

## 2022-12-11 NOTE — Unmapped (Signed)
Infectious Disease ([facility]) Progress Note    Assessment & Plan:   Karen Barber is a 57 y.o. female whose presentation is complicated by bronchiectasis type 1 in the setting of pulmonary sarcoidosis (6L  home O2), HTN, T2DM, COPD, s/p LUL lobectomy, and asthma that presented to [facility] admitted to MICU after becoming hypoxic after holding home meds prior to PET scan. After 1 week after diuresing ICU stabilized patient she was transferred to the MPCU.    Principal Problem:    Acute on chronic hypoxic respiratory failure (CMS-HCC)  Active Problems:    Pulmonary sarcoidosis (CMS-HCC)    Type 2 diabetes mellitus with hyperglycemia (CMS-HCC)    COPD (chronic obstructive pulmonary disease) (CMS-HCC)    Chronic pulmonary aspergillosis (CMS-HCC)    Pulmonary hypertension (CMS-HCC)    AKI (acute kidney injury) (CMS-HCC)    Active Problems  Acute on Chronic Hypoxemic Respiratory Failure -   Pulmonary Sarcoidosis -   COPD/asthma -   Bronchiectasis  Patient has notable history of pulmonary sarcoidosis, COPD, pulmonary hypertension and is on baseline oxygen 6 L.  Recent admission with pneumonia growing Serratia marcescens  s/p 1x Rocephin (8/18), Vancomycin (8/18-8/19), Zosyn (8/18-8/23) discharged on levofloxacin 500 mg ending on 8/31.  Patient held her medications only for the morning prior to an outpatient PET CT scan during which she became hypoxic to the 70s and was admitted to the MICU.  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 She received one dose of 125mg  of IV methylprednisolone. She was weaned from high flow nasal cannula to large bore nasal cannula after IV diuresis and a course of antibiotics for possible pneumonia with Vanco/cefepime later transition to levofloxacin with a total 1 week treatment with antibiotics. Started on vanc/cefepime for broad coverage --> stopped vanc and cefepime 9/12- then levaquin (9/12---9/16).  Patient was transferred to the floor on 9/18 after showing significant signs of improvement but still having occasional desaturations with ambulation. PET scan 9/18 is not indicative of active pulmonary sarcoidosis.  Acute exacerbation likely multifactorial including in not taking her home medications as per directed for airway clearance, volume overload, and higher O2 requirement chronically than the amount she has been using. RHC 9/20 showing significant pulm HTN disease with mean PA pressure of 47, for which pulmonology team started Iloprost. Notably, wedge pressure on that study was 5, which is reassuring against volume as driver of her respiratory status at that time.  -- pulmonology, appreciate recommendations  -- Continue home airway clearance (DuoNebs and HTS 3 times daily with [place])  -- Continue home Pulmicort, Brovana, Atrovent, as needed albuterol, and azithromycin  -- Lasix PO 80 mg twice daily  -- PT and OT consult     Pulmonary hypertension - RV failure - orthostatic hypotension  Patient has history of pulmonary hypertension due to pulmonary sarcoidosis/COPD.  TTE this admission 9/17 with severely dilated right atrium with severe pulmonary hypertension PASP 86 mmHg.  Estimated right atrial pressure 5 to 10 mmHg.  On Lasix 80 mg twice daily at home.  Echo showed LVEF greater than 55% and severe pulmonary hypertension no significant change since prior study.  Right heart cath showed severe pulmonary Hypertension, markedly elevated PVR (9.3 - 10.5) and low cardiac output / index.  -- Continue inhaled iloprost to 5 mcg 6 times a day q4h; please do not mix with other nebulizer solutions   -- Lasix 80 mg BID, home Sildenafil 40mg  tid  -- trend pro BNP, strict I/Os, Daily weights  -- Most  likely we will transition to Tyvaso nebs on discharge (unavailable during inpatient).   -- Follow-up high-resolution CT scan    Acute kidney injury on CKD3 (downtrending)  On admission patient's creatinine 1.31 which appears to be baseline.  On 9/19 patient was found to have an AKI by definition of > 0.3 increase in creatinine in 48 hours.  -- Renally dose medications  -- Daily BMP    Secondary Hyperparathyroidism, Hypocalcemia, and Hyperphosphatemia  Findings are consistent with secondary hyperparathyroidism due to CKD.  Ionized calcium: 4.37 mg/dL (low)  Phosphate: 5.6 mg/dL (elevated)  PTH: 161.0 pg/mL (elevated)  -- Reconsider need for future oral calcium carbonate 1600 mg TID  -- Reconsider need for future phoslo 667 mg PO TID   -- consider start calcitriol 0.25-0.5 mcg daily pending on vitamin D levels.  -- follow up a 25-hydroxy vitamin D level to guide further supplementation.  -- Reassess PTH levels in 4-6 weeks.    Type 2 diabetes mellitus with hyperglycemia and hypoglycemia  Hemoglobin A1c 8.3%.  Home regimen includes glargine 18 units nightly and sliding scale insulin.  Large fluctuations in glucose greater than 300 and occasional values ~50.  -- Sliding scale insulin insulin sensitivity factor 40  --12 glargine nightly  -- 3 units lispro tid     Left leg swelling (resolved )  Patient endorses intermittent left > right leg swelling. Venous doppler unremarkable.      Chronic Problems     Chronic Pain   - Continuing home gabapentin   - PT/OT consulted, appreciate recs     Chronic pulmonary aspergillosis  Patient has history of aspergilloma and is on chronic posaconazole. Soluble IL-2 receptor, Aspergillus Galactomannan Antigen, IgE checked 10/2022 all wnl  -Continue home posaconazole Ppx  -Outpatient evaluation for [facility] scheduled for 10/25 to evaluate recurrent hemoptysis     Iron deficiency anemia  Chronic normocytic anemia (baseline hemoglobin 11)  Patient has some mild normocytic anemia with hemoglobin of 11. Iron panel shows iron of 37 with iron saturation of 15.   --Iron supplementation with iron dextran 1,000mg  in 250cc  --Daily CBC    Hyperlipidemia  Last lipid profile showed an LDL of 146  -- Follow-up outpatient    Issues Impacting Complexity of Management:  -The patient is at high risk for the development of complications of volume overload due to the need to provide IV hydration for suspected hypovolemia in the setting of: Pulmonary hypertension  -Need for the following intensive monitoring parameter(s) due to high risk of clinical decline: continuous oxygen monitoring, telemetry, and frequent monitoring of urine output to assess for the efficacy of diuretic regimen  -Need for intensive oxygen therapy of HFNC, which places the patient at high risk for oxygen toxicity  -The patient is at high risk of complications from pulmonary hypertension    Daily Checklist:  Diet: Regular Diet   DVT PPx: Lovenox 40mg  q24h  Electrolytes: Replete Potassium to >/= 3.6 and Magnesium to >/= 1.8  Code Status: DNR and DNI  Dispo:  Continue MPCU    Team Contact Information:   Primary Team: Infectious Disease ([facility])  Primary Resident: Conception Chancy, MD,  Resident's Pager: [phone] (Infect Disease Intern - Tower)    Interval History:   No acute events overnight.    Patient says she feels discouraged this morning because she was out of breath with walking a short distance.    ROS negative except for where otherwise noted above.    Objective:   Temp:  [  36.3 ??C (97.3 ??F)-36.8 ??C (98.3 ??F)] 36.3 ??C (97.3 ??F)  Heart Rate:  [74-91] 76  SpO2 Pulse:  [71-96] 71  Resp:  [13-26] 13  BP: (91-101)/(57-71) 98/60  FiO2 (%):  [45 %] 45 %  SpO2:  [80 %-100 %] 93 %,   Intake/Output Summary (Last 24 hours) at 12/11/2022 1230  Last data filed at 12/11/2022 1145  Gross per 24 hour   Intake 1055 ml   Output 750 ml   Net 305 ml   , FiO2 (%):  [45 %] 45 %  O2 Device: Large bore nasal cannula (cool high flow)  O2 Flow Rate (L/min):  [6 L/min-8 L/min] 6 L/min    Gen: NAD, converses, appears comfortable on large bore nasal cannula  HENT: atraumatic, normocephalic  Heart: RRR  Lungs: CTAB, no crackles or wheezes  Abdomen: soft, NTND  Extremities: no edema

## 2022-12-11 NOTE — Unmapped (Signed)
Pt AOx4, afebrile, VSS this shift. Oxygen >90% on 6-8LBNC. Adequate UOP, no BM this shift. Pt c/o headache PRN given, see MAR. Falls, safety and VTE precautions in place. No additional adverse events.    Problem: Adult Inpatient Plan of Care  Goal: Plan of Care Review  Outcome: Ongoing - Unchanged  Flowsheets (Taken 12/10/2022 2341)  Progress: no change  Plan of Care Reviewed With: patient  Goal: Patient-Specific Goal (Individualized)  Outcome: Ongoing - Unchanged  Goal: Absence of Hospital-Acquired Illness or Injury  Outcome: Ongoing - Unchanged  Intervention: Identify and Manage Fall Risk  Recent Flowsheet Documentation  Taken 12/10/2022 2000 by Nanetta Batty, RN  Safety Interventions:   aspiration precautions   commode/urinal/bedpan at bedside   fall reduction program maintained   low bed   nonskid shoes/slippers when out of bed  Intervention: Prevent and Manage VTE (Venous Thromboembolism) Risk  Recent Flowsheet Documentation  Taken 12/10/2022 2315 by Nanetta Batty, RN  VTE Prevention/Management: anticoagulant therapy  Anti-Embolism Intervention: (Lovenox) Other (Comment)  Taken 12/10/2022 2200 by Nanetta Batty, RN  Anti-Embolism Intervention: (Lovenox) Other (Comment)  Taken 12/10/2022 2000 by Nanetta Batty, RN  VTE Prevention/Management: anticoagulant therapy  Anti-Embolism Intervention: (Lovenox) Other (Comment)  Intervention: Prevent Infection  Recent Flowsheet Documentation  Taken 12/10/2022 2000 by Nanetta Batty, RN  Infection Prevention: rest/sleep promoted  Goal: Optimal Comfort and Wellbeing  Outcome: Ongoing - Unchanged  Goal: Readiness for Transition of Care  Outcome: Ongoing - Unchanged  Goal: Rounds/Family Conference  Outcome: Ongoing - Unchanged

## 2022-12-12 LAB — PRO-BNP: PRO-BNP: 3070 pg/mL — ABNORMAL HIGH (ref <=300.0)

## 2022-12-12 LAB — IONIZED CALCIUM VENOUS: CALCIUM IONIZED VENOUS (MG/DL): 4.89 mg/dL (ref 4.40–5.40)

## 2022-12-12 MED ADMIN — gabapentin (NEURONTIN) capsule 300 mg: 300 mg | ORAL | @ 12:00:00

## 2022-12-12 MED ADMIN — sildenafiL (pulm.hypertension) (REVATIO) tablet 40 mg: 40 mg | ORAL | @ 12:00:00

## 2022-12-12 MED ADMIN — iloprost (VENTAVIS) nebulizer solution: 5 ug | RESPIRATORY_TRACT | @ 16:00:00

## 2022-12-12 MED ADMIN — ipratropium-albuterol (DUO-NEB) 0.5-2.5 mg/3 mL nebulizer solution 3 mL: 3 mL | RESPIRATORY_TRACT | @ 18:00:00

## 2022-12-12 MED ADMIN — magnesium oxide-Mg AA chelate (Magnesium Plus Protein) 2 tablet: 2 | ORAL | @ 12:00:00

## 2022-12-12 MED ADMIN — ipratropium-albuterol (DUO-NEB) 0.5-2.5 mg/3 mL nebulizer solution 3 mL: 3 mL | RESPIRATORY_TRACT | @ 13:00:00

## 2022-12-12 MED ADMIN — iloprost (VENTAVIS) nebulizer solution: 5 ug | RESPIRATORY_TRACT | @ 20:00:00

## 2022-12-12 MED ADMIN — insulin lispro (HumaLOG) injection 0-20 Units: 0-20 [IU] | SUBCUTANEOUS | @ 10:00:00

## 2022-12-12 MED ADMIN — enoxaparin (LOVENOX) syringe 40 mg: 40 mg | SUBCUTANEOUS | @ 01:00:00

## 2022-12-12 MED ADMIN — insulin lispro (HumaLOG) injection 3 Units: 3 [IU] | SUBCUTANEOUS | @ 22:00:00

## 2022-12-12 MED ADMIN — magnesium oxide-Mg AA chelate (Magnesium Plus Protein) 2 tablet: 2 | ORAL | @ 01:00:00

## 2022-12-12 MED ADMIN — sildenafiL (pulm.hypertension) (REVATIO) tablet 40 mg: 40 mg | ORAL | @ 01:00:00

## 2022-12-12 MED ADMIN — iloprost (VENTAVIS) nebulizer solution: 5 ug | RESPIRATORY_TRACT | @ 14:00:00

## 2022-12-12 MED ADMIN — posaconazole (NOXAFIL) delayed released tablet 200 mg: 200 mg | ORAL | @ 12:00:00

## 2022-12-12 MED ADMIN — ipratropium-albuterol (DUO-NEB) 0.5-2.5 mg/3 mL nebulizer solution 3 mL: 3 mL | RESPIRATORY_TRACT | @ 01:00:00

## 2022-12-12 MED ADMIN — iloprost (VENTAVIS) nebulizer solution: 5 ug | RESPIRATORY_TRACT | @ 02:00:00

## 2022-12-12 MED ADMIN — sodium chloride 3 % NEBULIZER solution 4 mL: 4 mL | RESPIRATORY_TRACT | @ 13:00:00

## 2022-12-12 MED ADMIN — sildenafiL (pulm.hypertension) (REVATIO) tablet 40 mg: 40 mg | ORAL | @ 19:00:00

## 2022-12-12 MED ADMIN — insulin lispro (HumaLOG) injection 0-20 Units: 0-20 [IU] | SUBCUTANEOUS | @ 01:00:00

## 2022-12-12 MED ADMIN — iloprost (VENTAVIS) nebulizer solution: 5 ug | RESPIRATORY_TRACT | @ 22:00:00

## 2022-12-12 MED ADMIN — gabapentin (NEURONTIN) capsule 300 mg: 300 mg | ORAL | @ 01:00:00

## 2022-12-12 MED ADMIN — acetaminophen (TYLENOL) tablet 650 mg: 650 mg | ORAL | @ 17:00:00

## 2022-12-12 MED ADMIN — furosemide (LASIX) tablet 80 mg: 80 mg | ORAL | @ 19:00:00

## 2022-12-12 MED ADMIN — diphenhydrAMINE (BENADRYL) injection 25 mg: 25 mg | INTRAVENOUS | @ 01:00:00 | Stop: 2022-12-12

## 2022-12-12 MED ADMIN — sodium chloride 3 % NEBULIZER solution 4 mL: 4 mL | RESPIRATORY_TRACT | @ 01:00:00

## 2022-12-12 MED ADMIN — azithromycin (ZITHROMAX) tablet 250 mg: 250 mg | ORAL | @ 12:00:00 | Stop: 2025-08-25

## 2022-12-12 MED ADMIN — insulin glargine (LANTUS) injection 12 Units: 12 [IU] | SUBCUTANEOUS | @ 01:00:00

## 2022-12-12 MED ADMIN — budesonide (PULMICORT) nebulizer solution 0.5 mg: .5 mg | RESPIRATORY_TRACT | @ 13:00:00

## 2022-12-12 MED ADMIN — arformoterol (BROVANA) nebulizer solution 15 mcg/2 mL: 15 ug | RESPIRATORY_TRACT | @ 13:00:00

## 2022-12-12 MED ADMIN — furosemide (LASIX) tablet 80 mg: 80 mg | ORAL | @ 10:00:00

## 2022-12-12 MED ADMIN — budesonide (PULMICORT) nebulizer solution 0.5 mg: .5 mg | RESPIRATORY_TRACT | @ 01:00:00

## 2022-12-12 MED ADMIN — arformoterol (BROVANA) nebulizer solution 15 mcg/2 mL: 15 ug | RESPIRATORY_TRACT | @ 01:00:00

## 2022-12-12 MED ADMIN — sodium chloride (NS) 0.9 % infusion: 20 mL/h | INTRAVENOUS | @ 01:00:00 | Stop: 2022-12-12

## 2022-12-12 MED ADMIN — iloprost (VENTAVIS) nebulizer solution: 5 ug | RESPIRATORY_TRACT | @ 05:00:00

## 2022-12-12 MED ADMIN — insulin lispro (HumaLOG) injection 3 Units: 3 [IU] | SUBCUTANEOUS | @ 17:00:00

## 2022-12-12 MED ADMIN — gabapentin (NEURONTIN) capsule 300 mg: 300 mg | ORAL | @ 19:00:00

## 2022-12-12 NOTE — Unmapped (Signed)
Pt A&O x4. VSS, NSR, normotensive, afebrile, on 6-8L LBNC. Pt continues receiving iloprost from RT. No complaints of pain.   Pt remains on regular diet w/ adequate I&Os. All falls & safety protocols remain in place.       Problem: Adult Inpatient Plan of Care  Goal: Plan of Care Review  Outcome: Ongoing - Unchanged  Goal: Patient-Specific Goal (Individualized)  Outcome: Ongoing - Unchanged  Goal: Absence of Hospital-Acquired Illness or Injury  Outcome: Ongoing - Unchanged  Intervention: Identify and Manage Fall Risk  Recent Flowsheet Documentation  Taken 12/12/2022 0800 by Luanne Bras, RN  Safety Interventions:   aspiration precautions   bleeding precautions   commode/urinal/bedpan at bedside   fall reduction program maintained   infection management   low bed   nonskid shoes/slippers when out of bed  Intervention: Prevent Infection  Recent Flowsheet Documentation  Taken 12/12/2022 0800 by Luanne Bras, RN  Infection Prevention:   environmental surveillance performed   equipment surfaces disinfected   hand hygiene promoted   personal protective equipment utilized  Goal: Optimal Comfort and Wellbeing  Outcome: Ongoing - Unchanged  Goal: Readiness for Transition of Care  Outcome: Ongoing - Unchanged  Goal: Rounds/Family Conference  Outcome: Ongoing - Unchanged     Problem: Fall Injury Risk  Goal: Absence of Fall and Fall-Related Injury  Outcome: Ongoing - Unchanged  Intervention: Promote Injury-Free Environment  Recent Flowsheet Documentation  Taken 12/12/2022 0800 by Luanne Bras, RN  Safety Interventions:   aspiration precautions   bleeding precautions   commode/urinal/bedpan at bedside   fall reduction program maintained   infection management   low bed   nonskid shoes/slippers when out of bed     Problem: Gas Exchange Impaired  Goal: Optimal Gas Exchange  Outcome: Ongoing - Unchanged  Intervention: Optimize Oxygenation and Ventilation  Recent Flowsheet Documentation  Taken 12/12/2022 1000 by Luanne Bras, RN  Head of Bed [facility]) Positioning: HOB at 30-45 degrees  Taken 12/12/2022 0800 by Luanne Bras, RN  Head of Bed [facility]) Positioning: HOB at 30-45 degrees     Problem: Comorbidity Management  Goal: Blood Glucose Levels Within Targeted Range  Outcome: Ongoing - Unchanged  Goal: Blood Pressure in Desired Range  Outcome: Ongoing - Unchanged

## 2022-12-12 NOTE — Unmapped (Signed)
Pt AOx4, afebrile, VSS this shift. Oxygen >88% on 6-8LBNC, minimal desat this shift. Adequate UOP via purewick, no BM this shift. Pt completed IV iron infusion, PRN benadryl given during infusion w/ relief. No c/o pain. Falls, safety and VTE precautions in place. No additional adverse events.    Problem: Adult Inpatient Plan of Care  Goal: Plan of Care Review  Outcome: Progressing  Flowsheets (Taken 12/12/2022 0220)  Progress: no change  Plan of Care Reviewed With: patient  Goal: Patient-Specific Goal (Individualized)  Outcome: Progressing  Goal: Absence of Hospital-Acquired Illness or Injury  Outcome: Progressing  Intervention: Identify and Manage Fall Risk  Recent Flowsheet Documentation  Taken 12/11/2022 2000 by Nanetta Batty, RN  Safety Interventions:   aspiration precautions   commode/urinal/bedpan at bedside   fall reduction program maintained   low bed   nonskid shoes/slippers when out of bed  Intervention: Prevent and Manage VTE (Venous Thromboembolism) Risk  Recent Flowsheet Documentation  Taken 12/12/2022 0200 by Nanetta Batty, RN  Anti-Embolism Intervention: (lovenox) Other (Comment)  Taken 12/12/2022 0000 by Nanetta Batty, RN  VTE Prevention/Management: anticoagulant therapy  Anti-Embolism Intervention: (SQ Lovenox) Other (Comment)  Taken 12/11/2022 2200 by Nanetta Batty, RN  Anti-Embolism Intervention: (lovenox) Other (Comment)  Taken 12/11/2022 2000 by Nanetta Batty, RN  VTE Prevention/Management: anticoagulant therapy  Anti-Embolism Intervention: (SQ Lovenox) Other (Comment)  Intervention: Prevent Infection  Recent Flowsheet Documentation  Taken 12/11/2022 2000 by Nanetta Batty, RN  Infection Prevention:   hand hygiene promoted   rest/sleep promoted  Goal: Optimal Comfort and Wellbeing  Outcome: Progressing  Goal: Readiness for Transition of Care  Outcome: Progressing  Goal: Rounds/Family Conference  Outcome: Progressing

## 2022-12-12 NOTE — Unmapped (Signed)
Infectious Disease ([facility]) Progress Note    Assessment & Plan:   Karen Barber is a 57 y.o. female whose presentation is complicated by bronchiectasis type 1 in the setting of pulmonary sarcoidosis (6L [place] home O2), HTN, T2DM, COPD, s/p LUL lobectomy, and asthma that presented to [facility] admitted to MICU after becoming hypoxic after holding home meds prior to PET scan. After 1 week after diuresing ICU stabilized patient she was transferred to the MPCU.    Principal Problem:    Acute on chronic hypoxic respiratory failure (CMS-HCC)  Active Problems:    Pulmonary sarcoidosis (CMS-HCC)    Type 2 diabetes mellitus with hyperglycemia (CMS-HCC)    COPD (chronic obstructive pulmonary disease) (CMS-HCC)    Chronic pulmonary aspergillosis (CMS-HCC)    Pulmonary hypertension (CMS-HCC)    AKI (acute kidney injury) (CMS-HCC)    Active Problems  Acute on Chronic Hypoxemic Respiratory Failure -   Pulmonary Sarcoidosis -   COPD/asthma -   Bronchiectasis  Patient has notable history of pulmonary sarcoidosis, COPD, pulmonary hypertension and is on baseline oxygen 6 L.  Recent admission with pneumonia growing Serratia marcescens  s/p 1x Rocephin (8/18), Vancomycin (8/18-8/19), Zosyn (8/18-8/23) discharged on levofloxacin 500 mg ending on 8/31.  Patient held her medications only for the morning prior to an outpatient PET CT scan during which she became hypoxic to the 70s and was admitted to the MICU.  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 She received one dose of 125mg  of IV methylprednisolone. She was weaned from high flow nasal cannula to large bore nasal cannula after IV diuresis and a course of antibiotics for possible pneumonia with Vanco/cefepime later transition to levofloxacin with a total 1 week treatment with antibiotics. Started on vanc/cefepime for broad coverage --> stopped vanc and cefepime 9/12- then levaquin (9/12---9/16).  Patient was transferred to the floor on 9/18 after showing significant signs of improvement but still having occasional desaturations with ambulation. PET scan 9/18 is not indicative of active pulmonary sarcoidosis.  Acute exacerbation likely multifactorial including in not taking her home medications as per directed for airway clearance, volume overload, and higher O2 requirement chronically than the amount she has been using. RHC 9/20 showing significant pulm HTN disease with mean PA pressure of 47, for which pulmonology team started Iloprost. Notably, wedge pressure on that study was 5, which is reassuring against volume as driver of her respiratory status at that time.Currently requiring 6-8 L [place] and at this point it is likely she has higher O2 requirement chronically than the amount she has been using at home. This may reflect her new baseline and this was discussed with the patient.   -- plan to get oxymizer for her while admitted and repeat . Will need oxymizer at home  -- pulmonology, appreciate recommendations  -- Continue home airway clearance (DuoNebs and HTS 3 times daily with [place])  -- Continue home Pulmicort, Brovana, Atrovent, as needed albuterol, and azithromycin  -- Lasix PO 80 mg twice daily  -- PT and OT consult     Pulmonary hypertension - RV failure - orthostatic hypotension  Patient has history of pulmonary hypertension due to pulmonary sarcoidosis/COPD.  TTE this admission 9/17 with severely dilated right atrium with severe pulmonary hypertension PASP 86 mmHg.  Estimated right atrial pressure 5 to 10 mmHg.  On Lasix 80 mg twice daily at home.  Echo showed LVEF greater than 55% and severe pulmonary hypertension no significant change since prior study.  Right heart cath showed severe  pulmonary Hypertension, markedly elevated PVR (9.3 - 10.5) and low cardiac output / index.  -- Continue inhaled iloprost 5 mcg 6 times a day q4h; please do not mix with other nebulizer solutions   -- Lasix 80 mg BID, home Sildenafil 40mg  tid  -- trend pro BNP, strict I/Os, Daily weights  -- Most likely we will transition to Tyvaso nebs on discharge (unavailable during inpatient).   -- Follow-up high-resolution CT scan    Acute kidney injury on CKD3 (downtrending)  On admission patient's creatinine 1.31 which appears to be baseline.  On 9/19 patient was found to have an AKI by definition of > 0.3 increase in creatinine in 48 hours.  -- Renally dose medications  -- Daily BMP    Secondary Hyperparathyroidism, Hypocalcemia, and Hyperphosphatemia  Findings are consistent with secondary hyperparathyroidism due to CKD.  Ionized calcium: 4.37 mg/dL (low)  Phosphate: 5.6 mg/dL (elevated)  PTH: 540.9 pg/mL (elevated)  -- Reconsider need for future oral calcium carbonate 1600 mg TID  -- Reconsider need for future phoslo 667 mg PO TID   -- consider start calcitriol 0.25-0.5 mcg daily pending on vitamin D levels.  -- follow up a 25-hydroxy vitamin D level to guide further supplementation.  -- Reassess PTH levels in 4-6 weeks.    Type 2 diabetes mellitus with hyperglycemia and hypoglycemia  Hemoglobin A1c 8.3%.  Home regimen includes glargine 18 units nightly and sliding scale insulin.  Large fluctuations in glucose greater than 300 and occasional values ~50.  -- Sliding scale insulin insulin sensitivity factor 40  --12 glargine nightly  -- 3 units lispro tid     Left leg swelling (resolved )  Patient endorses intermittent left > right leg swelling. Venous doppler unremarkable.      Chronic Problems     Chronic Pain   - Continuing home gabapentin   - PT/OT consulted, appreciate recs     Chronic pulmonary aspergillosis  Patient has history of aspergilloma and is on chronic posaconazole. Soluble IL-2 receptor, Aspergillus Galactomannan Antigen, IgE checked 10/2022 all wnl  -Continue home posaconazole Ppx  -Outpatient evaluation for [facility] scheduled for 10/25 to evaluate recurrent hemoptysis     Iron deficiency anemia  Chronic normocytic anemia (baseline hemoglobin 11)  Patient has some mild normocytic anemia with hemoglobin of 11. Iron panel shows iron of 37 with iron saturation of 15.   --Iron supplementation with iron dextran 1,000mg  in 250cc    Hyperlipidemia  Last lipid profile showed an LDL of 146  -- Follow-up outpatient    Issues Impacting Complexity of Management:  -The patient is at high risk for the development of complications of volume overload due to the need to provide IV hydration for suspected hypovolemia in the setting of: Pulmonary hypertension  -Need for the following intensive monitoring parameter(s) due to high risk of clinical decline: continuous oxygen monitoring, telemetry, and frequent monitoring of urine output to assess for the efficacy of diuretic regimen  -Need for intensive oxygen therapy of HFNC, which places the patient at high risk for oxygen toxicity  -The patient is at high risk of complications from pulmonary hypertension    Daily Checklist:  Diet: Regular Diet   DVT PPx: Lovenox 40mg  q24h  Electrolytes: Replete Potassium to >/= 3.6 and Magnesium to >/= 1.8  Code Status: DNR and DNI  Dispo:  Continue MPCU    Team Contact Information:   Primary Team: Infectious Disease ([facility])  Primary Resident: Conception Chancy, MD,  Resident's Pager: [phone] (Infect Disease  Intern - Tower)    Interval History:   No acute events overnight.    ROS negative except for where otherwise noted above.    Objective:   Temp:  [36.3 ??C (97.3 ??F)-36.4 ??C (97.6 ??F)] 36.3 ??C (97.3 ??F)  Heart Rate:  [73-94] 73  SpO2 Pulse:  [73-96] 73  Resp:  [15-43] 32  BP: (68-117)/(40-75) 98/66  FiO2 (%):  [52 %] 52 %  SpO2:  [85 %-100 %] 100 %,   Intake/Output Summary (Last 24 hours) at 12/12/2022 1158  Last data filed at 12/12/2022 1115  Gross per 24 hour   Intake 1850 ml   Output 1050 ml   Net 800 ml   , FiO2 (%):  [52 %] 52 %  O2 Device: Large bore nasal cannula (cool high flow)  O2 Flow Rate (L/min):  [6 L/min-8 L/min] 8 L/min    Gen: NAD, converses, appears comfortable on large bore nasal cannula  HENT: atraumatic, normocephalic  Heart: RRR  Lungs: CTAB, no crackles or wheezes  Abdomen: soft, NTND  Extremities: no edema     Warda Mcqueary Raelyn Mora, MD   [facility] Internal Medicine PGY-1

## 2022-12-12 NOTE — Unmapped (Signed)
Pulmonary Consult Service  Consult Follow Up Note      Primary Service:   Medicine  Primary Service Attending:  Ebony Hail, MD  Reason for Consult:   Assistance with diuresis    ASSESSMENT and PLAN     Karen Barber is a 57 y.o. 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 Pt was found to have acute on chronic respiratory failure (sat in 70s on home 6L) in the setting of not taking her home medications (sildenafil, lasix, nebs, inhalers) in preparation for PET scan, admitted 9/11. She was admitted to the ICU initially, has since been weaned back to 6-8L [place] with airway clearance and diuresis.     Her acute on chronic hypoxemia and increase O2 requirement is likely multifactorial -- combination of volume overload as well as lack of airway clearance at home, however I think chronically her O2 requirement (related to her PH) is higher than the amount she has been on at home.    Acute on Chronic Hypoxemic Respiratory Failure  COPD/Asthma  Pulmonary Sarcoidosis  Bronchiectasis s/s sarcoid  Soluble IL-2 receptor, Aspergillus Galactomannan Antigen, IgE checked 10/2022 all wnl. PET scan 9/17 does not appear to be active pulmonary sarcoidosis and again it is overall felt to be burned out from sarcoidosis standpoint. Patient says this is the best that she's felt since coming to the hospital. Currently requiring 6-8 L [place] with appropriate airway clearance and at this point it is likely she has higher O2 requirement chronically than the amount she has been using at home. This may reflect her new baseline and this was discussed with the patient.   Plan:  - continue home pulmicort, brovana, PRN albuterol, azithromycin  - plan to get oxymizer for her while admitted and repeat . Will need oxymizer at home  - please cont airway clearance:   - duonebs TID and HTS (3%) BID with aerobika    RV Failure  PAH (Group 3, 5)  TTE 9/17 showed severe pulmonary HTN (PASP ), severely dilated RV with mildly reduced systolic function, moderately dilated RA. Likely multifactorial in the setting of known COPD, pulmonary sarcoidosis. Pro-BNP still elevated at 3,291. Since admission cumulatively -11.6 L and pt appears to be euvolemic on exam. RHC 9/20 demonstrated severe pulmonary hypertension, markedly elevated PVR (9.3-10.5) and low cardiac output/index.  Plan:  - Plan to transition to Tyvaso nebs on discharge (arranged with [facility] clinic RN).   - repeat HRCT. Tyvaso enrollment sent to [facility], awaiting HRCT for PH-ILD diagnosis  - Continue sildenafil 40 mg  - Continue inhaled iloprost 5 mcg q4h. Please do not mix with other nebulizer solutions  - Continue furosemide (80 mg BID PO)   - Continue daily weight with strict I/O  - cont daily pro-BNP    Chronic Pulmonary Aspergillosis   Aspergillus Galactomannan Antigen, IgE checked 10/2022 all wnl.   - continue home posaconazole   - pt has outpt evaluation for [facility] scheduled for 10/25 for history of recurrent hemoptysis    Karen Barber was seen, examined and discussed with  Dr. Nada Libman  Thank you for involving [place] in her care. We look forward to following with you.  Recommendations were communicated to the primary team at 10:14 AM  Please page [place] with any questions or concerns  at [phone] (pulmonary consult fellow).    Audery Amel  Medical Student    Alesia Banda, MD  Pulmonary Critical Care Fellow      HISTORY:     History of Present Illness:  Karen Barber is a 57 y.o. female with hx pf pulmonary sarcoidosis, COPD, PAH (baseline home O2 6L), chronic pulmonary aspergillosis, s/p LUL lobectomy 2016 who initially presented to the hospital for a PET scan to evaluate her sarcoidosis.     Pt was found to have acute on chronic respiratory failure (sat in 70s on home 6L) in the setting of not taking her home medications (sildenafil, lasix, nebs, inhalers) in preparation for PET scan. As her respiratory status did not improve with NRB, placed on HFNC. 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 She received one dose of 125mg  of IV methylprednisolone and then was transferred to the MICU for further management of her hypoxia. Pt had a week long stay in the MICU and was weaned from HFNC to [facility] after IV diuresis and course of antibiotics (completed course of levaquin) for possible pneumonia. Pt has been continuing home airway clearance (Aerobika and HTS neb), and home pulmicort, brovana, atrovent, and PRN albuterol. Showing significant signs of improvement but still having desaturations with ambulation.     Of note, has been hospitalized twice prior to this in the past 6 weeks.  8/18-8/25: presented with increased O2 requirement, increased swelling, brown sputum production.   - treated for serratia pneumonia (completed levaquin)  - Was discharged on AVAPS for home, as well as with HTS/albuterol and [place].  10/21/22-10/25/22: presented with DOE, up to 8-10L with exertion and desats to 70%.   - treated with 14 day course augmentin  - trialed steroids, ultimately discontinued    Interval Hx: Pt started iloprost 9/19 and has tolerated it well so far. RHC 9/20 demonstrated severe pulmonary hypertension, markedly elevated PVR (9.3-10.5) and low cardiac output/index.    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 A.niger from sputum 08/2016-09/2016.    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    Sarcoidosis 1995    Type 2 diabetes mellitus with hyperglycemia (CMS-HCC) 07/27/2014    Visual impairment     glasses     Past Surgical History:   Procedure Laterality Date    LUNG LOBECTOMY      PR AMPUTATION TOE,MT-P JT Right 12/12/2019    Procedure: Right foot fifth toe amputation at the metatarsophalangeal joint level;  Surgeon: Britt Bottom, DPM;  Location: MAIN OR [facility];  Service: Vascular    PR AMPUTATION TOE,MT-P JT Right 06/16/2020    Procedure: Right foot amputation of 3rd toe at mpj level;  Surgeon: Britt Bottom, DPM;  Location: MAIN OR [place];  Service: Vascular    PR RIGHT HEART CATH O2 SATURATION & CARDIAC OUTPUT N/A 04/29/2019    Procedure: Right Heart Catheterization;  Surgeon: Rosana Hoes, MD;  Location: [facility] C[facility];  Service: Cardiology    PR RIGHT HEART CATH O2 SATURATION & CARDIAC OUTPUT N/A 12/08/2022    Procedure: Right Heart Catheterization;  Surgeon: Neal Dy, MD;  Location: [facility] CATH;  Service: Cardiology    PR THORACOSCOPY SURG TOT PULM DECORT Left 12/14/2014    Procedure: THORACOSCOPY SURG; W/TOT PULM DECORTIC/PNEUMOLYS;  Surgeon: Evert Kohl, MD;  Location: MAIN OR [facility];  Service: Cardiothoracic    PR THORACOSCOPY W/THERA WEDGE RESEXN INITIAL UNILAT Left 12/03/2014    Procedure: THORACOSCOPY, SURGICAL; WITH THERAPEUTIC WEDGE RESECTION (EG, MASS, NODULE) INITIAL UNILATERAL;  Surgeon: Evert Kohl, MD;  Location: MAIN OR [facility];  Service: Cardiothoracic       Other History:  Family History   Problem Relation Age of Onset    Dementia Mother     Diabetes Father     Diabetes Brother     Diabetes Maternal Aunt     Sarcoidosis Maternal Aunt     Diabetes Maternal Uncle     Diabetes Paternal Aunt     Diabetes Paternal Uncle      Social History     Socioeconomic History    Marital status: Single   Tobacco Use    Smoking status: Former     Current packs/day: 0.00     Average packs/day: 0.5 packs/day for 9.0 years (4.5 ttl pk-yrs)     Types: Cigarettes     Start date: 05/02/1978     Quit date: 05/03/1987     Years since quitting: 35.6    Smokeless tobacco: Never   Vaping Use    Vaping status: Never Used   Substance and Sexual Activity    Alcohol use: No     Alcohol/week: 0.0 standard drinks of alcohol    Drug use: No    Sexual activity: Yes   Other Topics Concern    Do you use sunscreen? No    Tanning bed use? No    Are you easily burned? No    Excessive sun exposure? No    Blistering sunburns? No     Social Determinants of Health     Financial Resource Strain: Low Risk  (11/06/2022)    Overall Financial Resource Strain (CARDIA)     Difficulty of Paying Living Expenses: Not hard at all   Food Insecurity: No Food Insecurity (11/06/2022)    Hunger Vital Sign     Worried About Running Out of Food in the Last Year: Never true     Ran Out of Food in the Last Year: Never true   Transportation Needs: No Transportation Needs (11/06/2022)    PRAPARE - Therapist, art (Medical): No     Lack of Transportation (Non-Medical): No       Home Medications:  No current facility-administered medications on file prior to encounter.     Current Outpatient Medications on File Prior to Encounter   Medication Sig Dispense Refill    ACCU-CHEK GUIDE GLUCOSE METER Misc 1 Device by Other route Four (4) times a day (before meals and nightly). AS DIRECTED 1 kit 0    albuterol 2.5 mg /3 mL (0.083 %) nebulizer solution Inhale 3 mL (2.5 mg total) by nebulization every six (6) hours as needed for wheezing or shortness of breath (airway clearance/cough). 360 mL  3    albuterol HFA 90 mcg/actuation inhaler Inhale 2 puffs every four (4) hours as needed for wheezing or shortness of breath. 54 g 0    arformoterol (BROVANA) 15 mcg/2 mL Inhale 2 mL (15 mcg total) by nebulization in the morning and 2 mL (15 mcg total) in the evening. 120 mL 2    aspirin-caffeine (BAYER BACK AND BODY) 500-32.5 mg Tab Take by mouth. Takes 2 1/2 pills 3-4 times a day as needed.      azithromycin (ZITHROMAX) 250 MG tablet Take 1 tablet (250 mg total) by mouth daily. Start on 9/1 after completing the levofloxacin 90 tablet 0    blood sugar diagnostic Strp by Other route Three (3) times a day. ACCU-CHEK Guide meter 300 strip 1    budesonide (PULMICORT) 0.5 mg/2 mL nebulizer solution Inhale 2 mL (0.5 mg total) by nebulization in the morning and 2 mL (0.5 mg total) in the evening. 120 mL 2    furosemide (LASIX) 80 MG tablet Take 1 tablet (80 mg total) by mouth two (2) times a day. Take a third dose as needed for increasing weight or increasing swelling. 60 tablet 0    gabapentin (NEURONTIN) 300 MG capsule Take 1 capsule (300 mg total) by mouth Three (3) times a day. 270 capsule 3    insulin aspart (NOVOLOG U-100 INSULIN ASPART) 100 unit/mL vial Inject 0.01-0.2 mL (1-20 Units total) under the skin Four (4) times a day (before meals and nightly). SLIDING SCALE 20 mL 1    insulin glargine (BASAGLAR KWIKPEN U-100 INSULIN) 100 unit/mL (3 mL) injection pen Inject 0.18 mL (18 Units total) under the skin nightly. 15 mL 0    ipratropium (ATROVENT) 0.02 % nebulizer solution Inhale 2.5 mL (500 mcg total) by nebulization four (4) times a day. 300 mL 0    posaconazole (NOXAFIL) 100 mg delayed released tablet Take 2 tablets by mouth once daily 60 tablet 0    sildenafiL, pulm.hypertension, (REVATIO) 20 mg tablet Take 1 tablet (20 mg total) by mouth Three (3) times a day. 90 tablet 0    OXYGEN-AIR DELIVERY SYSTEMS MISC Dose: 2-3 LPM         In-Hospital Medications:  Scheduled:   arformoterol  15 mcg Nebulization BID (RT)    azithromycin  250 mg Oral Daily    budesonide  0.5 mg Nebulization BID (RT)    enoxaparin (LOVENOX) injection  40 mg Subcutaneous Nightly    furosemide  80 mg Oral BID    gabapentin  300 mg Oral TID iloprost  5 mcg Nebulization 6XD    insulin glargine  12 Units Subcutaneous Nightly    insulin lispro  0-20 Units Subcutaneous ACHS    insulin lispro  3 Units Subcutaneous 3xd Meals    ipratropium-albuterol  3 mL Nebulization TID (RT)    magnesium oxide-Mg AA chelate  2 tablet Oral BID    polyethylene glycol  17 g Oral Daily    posaconazole  200 mg Oral Daily    sildenafiL (pulm.hypertension)  40 mg Oral TID    sodium chloride  4 mL Nebulization BID (RT)     Continuous Infusions:   sodium chloride 20 mL/hr (12/11/22 2030)     PRN Medications:  acetaminophen, albuterol, calcium carbonate, dextrose in water, Implement **AND** Care order/instruction **AND** Vital signs **AND** Adult Oxygen therapy **AND** sodium chloride **AND** sodium chloride 0.9% **AND** diphenhydrAMINE **AND** famotidine **AND** methylPREDNISolone sodium succinate **AND** EPINEPHrine, glucagon, ondansetron **OR** ondansetron, senna, sodium chloride  Allergies:  Allergies as of 11/29/2022    (No Known Allergies)         PHYSICAL EXAM:   BP 98/66  - Pulse 73  - Temp 36.3 ??C (97.3 ??F) (Oral)  - Resp (!) 32  - Ht 180.3 cm (5' 11)  - Wt 90.7 kg (199 lb 15.3 oz)  - LMP  (LMP Unknown)  - SpO2 100%  - BMI 27.89 kg/m??   General: Alert, well-appearing, and in no distress.  Eyes: Anicteric sclera, conjunctiva clear.  ENT:   Nares normal, septum midline, mucosa normal, no drainage.  Cardiovascular: Regular rate and rhythm.  Pulmonary: Diminished lung sounds in bilateral upper lobes. Diffuse crackles/wheezing throughout bilateral lungs  Musculoskeletal: No clubbing.  Skin: No rashes or lesions., Mild swelling, L>R  Neuro: No focal neurological deficits.    LABORATORY and RADIOLOGY DATA:     Pertinent Laboratory Data:  Lab Results   Component Value Date    WBC 4.0 12/11/2022    HGB 10.7 (L) 12/11/2022    HCT 32.7 (L) 12/11/2022    PLT 166 12/11/2022       Lab Results   Component Value Date    NA 139 12/09/2022    K 4.4 12/09/2022    CL 101 12/09/2022 CO2 35.0 (H) 12/09/2022    BUN 35 (H) 12/09/2022    CREATININE 1.51 (H) 12/09/2022    GLU 112 12/09/2022    CALCIUM 9.8 12/09/2022    MG 2.3 12/11/2022    PHOS 5.0 12/11/2022       Lab Results   Component Value Date    BILITOT 0.4 12/04/2022    BILIDIR <0.10 05/30/2019    PROT 6.1 12/04/2022    ALBUMIN 2.8 (L) 12/04/2022    ALT 8 (L) 12/04/2022    AST 8 12/04/2022    ALKPHOS 63 12/04/2022    GGT 60 (H) 05/31/2011       Lab Results   Component Value Date    INR 1.02 12/11/2019    APTT 30.9 12/11/2019       Pertinent Micro Data:  Microbiology Results (last day)       ** No results found for the last 24 hours. **             Pertinent Imaging Data:  9/11 CXR   - Extensive fibrotic changes and biapical bulla appear grossly unchanged. Extensive pulmonary fibrosis limits evaluation for superimposed infection.     9/17 Echo    Summary    1. Limited study.    2. The left ventricle is normal in size with normal wall thickness.    3. The left ventricular systolic function is normal, LVEF is visually  estimated at > 55%.    4. The right ventricle is severely dilated in size, with mildly reduced  systolic function.    5. There is severe pulmonary hypertension.    6. TR maximum velocity: 4.4 m/s  Estimated PASP: 86 mmHg.    7. IVC size and inspiratory change suggest mildly elevated right atrial  pressure. (5-10 mmHg).    8. No significant change since prior study from 10/23/2022    9/18 PET CT  Impression   Bulky, partially calcified lymph nodes in the mediastinum and bilateral hilar with no significant to mild uptake, decreased in intensity since prior.   Grossly unchanged metabolically active right supraclavicular lymph nodes, accounting for differences in positioning of the arms and lack of IV contrast.   Bronchiectasis and partially metabolically  active reticular opacities are seen bilaterally, most prominent in the lower lobes, overall similar to prior.   Dilated main pulmonary artery measuring up to 4.0 cm, suggestive of pulmonary arterial hypertension.     9/20 RHC  FINAL CARDIAC CATHETERIZATION REPORT    CONCLUSIONS:  - Severe pulmonary Hypertension  - Markedly elevated PVR (9.3 - 10.5)  - Low cardiac output / index

## 2022-12-13 LAB — IONIZED CALCIUM VENOUS: CALCIUM IONIZED VENOUS (MG/DL): 4.81 mg/dL (ref 4.40–5.40)

## 2022-12-13 LAB — PRO-BNP: PRO-BNP: 2885 pg/mL — ABNORMAL HIGH (ref <=300.0)

## 2022-12-13 LAB — POSACONAZOLE: POSACONAZOLE LEVEL: 606 ng/mL

## 2022-12-13 MED ADMIN — iloprost (VENTAVIS) nebulizer solution: 5 ug | RESPIRATORY_TRACT | @ 14:00:00

## 2022-12-13 MED ADMIN — sildenafiL (pulm.hypertension) (REVATIO) tablet 40 mg: 40 mg | ORAL | @ 13:00:00

## 2022-12-13 MED ADMIN — acetaminophen (TYLENOL) tablet 650 mg: 650 mg | ORAL | @ 06:00:00

## 2022-12-13 MED ADMIN — insulin lispro (HumaLOG) injection 3 Units: 3 [IU] | SUBCUTANEOUS | @ 22:00:00

## 2022-12-13 MED ADMIN — sildenafiL (pulm.hypertension) (REVATIO) tablet 40 mg: 40 mg | ORAL | @ 01:00:00

## 2022-12-13 MED ADMIN — gabapentin (NEURONTIN) capsule 300 mg: 300 mg | ORAL | @ 01:00:00

## 2022-12-13 MED ADMIN — ipratropium-albuterol (DUO-NEB) 0.5-2.5 mg/3 mL nebulizer solution 3 mL: 3 mL | RESPIRATORY_TRACT | @ 03:00:00

## 2022-12-13 MED ADMIN — gabapentin (NEURONTIN) capsule 300 mg: 300 mg | ORAL | @ 13:00:00

## 2022-12-13 MED ADMIN — furosemide (LASIX) tablet 80 mg: 80 mg | ORAL | @ 17:00:00

## 2022-12-13 MED ADMIN — sodium chloride 3 % NEBULIZER solution 4 mL: 4 mL | RESPIRATORY_TRACT | @ 12:00:00

## 2022-12-13 MED ADMIN — azithromycin (ZITHROMAX) tablet 250 mg: 250 mg | ORAL | @ 13:00:00 | Stop: 2025-08-25

## 2022-12-13 MED ADMIN — insulin glargine (LANTUS) injection 12 Units: 12 [IU] | SUBCUTANEOUS | @ 01:00:00

## 2022-12-13 MED ADMIN — ipratropium-albuterol (DUO-NEB) 0.5-2.5 mg/3 mL nebulizer solution 3 mL: 3 mL | RESPIRATORY_TRACT | @ 12:00:00

## 2022-12-13 MED ADMIN — budesonide (PULMICORT) nebulizer solution 0.5 mg: .5 mg | RESPIRATORY_TRACT | @ 12:00:00

## 2022-12-13 MED ADMIN — magnesium oxide-Mg AA chelate (Magnesium Plus Protein) 2 tablet: 2 | ORAL | @ 13:00:00

## 2022-12-13 MED ADMIN — posaconazole (NOXAFIL) delayed released tablet 200 mg: 200 mg | ORAL | @ 13:00:00

## 2022-12-13 MED ADMIN — sodium chloride 3 % NEBULIZER solution 4 mL: 4 mL | RESPIRATORY_TRACT | @ 03:00:00

## 2022-12-13 MED ADMIN — tranexamic acid 100 mg/mL solution: 1 g | TOPICAL | @ 21:00:00 | Stop: 2022-12-13

## 2022-12-13 MED ADMIN — budesonide (PULMICORT) nebulizer solution 0.5 mg: .5 mg | RESPIRATORY_TRACT | @ 03:00:00

## 2022-12-13 MED ADMIN — iloprost (VENTAVIS) nebulizer solution: 5 ug | RESPIRATORY_TRACT | @ 20:00:00

## 2022-12-13 MED ADMIN — iloprost (VENTAVIS) nebulizer solution: 5 ug | RESPIRATORY_TRACT | @ 03:00:00

## 2022-12-13 MED ADMIN — iloprost (VENTAVIS) nebulizer solution: 5 ug | RESPIRATORY_TRACT | @ 16:00:00

## 2022-12-13 MED ADMIN — gabapentin (NEURONTIN) capsule 300 mg: 300 mg | ORAL | @ 17:00:00

## 2022-12-13 MED ADMIN — iloprost (VENTAVIS) nebulizer solution: 5 ug | RESPIRATORY_TRACT | @ 22:00:00

## 2022-12-13 MED ADMIN — insulin lispro (HumaLOG) injection 3 Units: 3 [IU] | SUBCUTANEOUS | @ 15:00:00

## 2022-12-13 MED ADMIN — arformoterol (BROVANA) nebulizer solution 15 mcg/2 mL: 15 ug | RESPIRATORY_TRACT | @ 12:00:00

## 2022-12-13 MED ADMIN — enoxaparin (LOVENOX) syringe 40 mg: 40 mg | SUBCUTANEOUS | @ 01:00:00

## 2022-12-13 MED ADMIN — furosemide (LASIX) tablet 80 mg: 80 mg | ORAL | @ 11:00:00

## 2022-12-13 MED ADMIN — insulin lispro (HumaLOG) injection 0-20 Units: 0-20 [IU] | SUBCUTANEOUS | @ 04:00:00

## 2022-12-13 MED ADMIN — ipratropium-albuterol (DUO-NEB) 0.5-2.5 mg/3 mL nebulizer solution 3 mL: 3 mL | RESPIRATORY_TRACT | @ 20:00:00

## 2022-12-13 MED ADMIN — oxymetazoline (AFRIN) 0.05 % nasal spray 3 spray: 3 | NASAL | @ 18:00:00

## 2022-12-13 MED ADMIN — arformoterol (BROVANA) nebulizer solution 15 mcg/2 mL: 15 ug | RESPIRATORY_TRACT | @ 03:00:00

## 2022-12-13 MED ADMIN — sildenafiL (pulm.hypertension) (REVATIO) tablet 40 mg: 40 mg | ORAL | @ 17:00:00

## 2022-12-13 MED ADMIN — magnesium oxide-Mg AA chelate (Magnesium Plus Protein) 2 tablet: 2 | ORAL | @ 01:00:00

## 2022-12-13 NOTE — Unmapped (Signed)
Pt is A&Ox4, VSS, O2 sats >88% on 8L LBNC, had an episode of desatting to 70s w/o exertion, required 15L NRB to recover, then weaned back down to 8L within the hour. Chest X-Ray and chest CT completed. Afebrile. Some c/o pain, given PRN tylenol with relief. UO adequate, no BM this shift. Pt's appetite has been adequate. Skin intact, standard precautions maintained. Pt reported difficulty sleeping, provider notified and ordered PRN melatonin. No falls/injuries this shift. All monitors with appropriate alarm settings, call bell within reach, see flowsheets/MAR for further info.        Problem: Adult Inpatient Plan of Care  Goal: Plan of Care Review  Outcome: Ongoing - Unchanged  Goal: Patient-Specific Goal (Individualized)  Outcome: Ongoing - Unchanged  Goal: Absence of Hospital-Acquired Illness or Injury  Outcome: Ongoing - Unchanged  Intervention: Identify and Manage Fall Risk  Recent Flowsheet Documentation  Taken 12/12/2022 2000 by Lavone Neri, RN  Safety Interventions:   aspiration precautions   bleeding precautions   commode/urinal/bedpan at bedside   fall reduction program maintained   infection management   lighting adjusted for tasks/safety   low bed   room near unit station   nonskid shoes/slippers when out of bed   toileting scheduled  Intervention: Prevent Skin Injury  Recent Flowsheet Documentation  Taken 12/13/2022 0200 by Lavone Neri, RN  Positioning for Skin: (sitting in bed) Other (Comment)  Taken 12/12/2022 2000 by Lavone Neri, RN  Device Skin Pressure Protection:   absorbent pad utilized/changed   adhesive use limited   positioning supports utilized   pressure points protected   skin-to-device areas padded   skin-to-skin areas padded   tubing/devices free from skin contact  Skin Protection:   adhesive use limited   incontinence pads utilized   skin-to-skin areas padded   tubing/devices free from skin contact  Intervention: Prevent Infection  Recent Flowsheet Documentation  Taken 12/12/2022 2000 by Lavone Neri, RN  Infection Prevention:   hand hygiene promoted   personal protective equipment utilized   rest/sleep promoted   single patient room provided  Goal: Optimal Comfort and Wellbeing  Outcome: Ongoing - Unchanged  Goal: Readiness for Transition of Care  Outcome: Ongoing - Unchanged  Goal: Rounds/Family Conference  Outcome: Ongoing - Unchanged     Problem: Fall Injury Risk  Goal: Absence of Fall and Fall-Related Injury  Outcome: Ongoing - Unchanged  Intervention: Promote Injury-Free Environment  Recent Flowsheet Documentation  Taken 12/12/2022 2000 by Lavone Neri, RN  Safety Interventions:   aspiration precautions   bleeding precautions   commode/urinal/bedpan at bedside   fall reduction program maintained   infection management   lighting adjusted for tasks/safety   low bed   room near unit station   nonskid shoes/slippers when out of bed   toileting scheduled     Problem: Gas Exchange Impaired  Goal: Optimal Gas Exchange  Outcome: Ongoing - Unchanged  Intervention: Optimize Oxygenation and Ventilation  Recent Flowsheet Documentation  Taken 12/13/2022 0400 by Lavone Neri, RN  Head of Bed [facility]) Positioning: HOB at 30-45 degrees  Taken 12/13/2022 0200 by Lavone Neri, RN  Head of Bed [facility]) Positioning: HOB at 20-30 degrees  Taken 12/13/2022 0000 by Lavone Neri, RN  Head of Bed [facility]) Positioning: HOB at 30-45 degrees  Taken 12/12/2022 2322 by Lavone Neri, RN  Head of Bed [facility]) Positioning: HOB at 30-45 degrees  Taken 12/12/2022 2000 by Lavone Neri, RN  Head of Bed [facility]) Positioning: HOB at 30-45 degrees  Taken 12/12/2022 1930  by Lavone Neri, RN  Head of Bed [facility]) Positioning: HOB at 30-45 degrees     Problem: Comorbidity Management  Goal: Blood Glucose Levels Within Targeted Range  Outcome: Ongoing - Unchanged  Goal: Blood Pressure in Desired Range  Outcome: Ongoing - Unchanged

## 2022-12-13 NOTE — Unmapped (Signed)
ICU TRANSPORT NOTE    Destination: CT    Departing Unit: MPCU  Pickup Time: 0250    Return Unit: MPCU  Return Time: 19    [place] patient ID band verified  Allergies Reviewed  Code Status at time of transport: Full    Report received from primary nurse via [facility]ARq. Handoff performed . Patient transported via stretcher under stepdown level of care. See vital signs during transport via [employer]. O2 via LBNC @ 10 L. Patient is alert and following commands. Patient tolerated procedure/scan well. Universal precautions maintained throughout transport.    Update and care given to primary nurse. See Doc Flowsheets/MAR for additional transportation documentation. Proper body mechanics and safe patient handling equipment were utilized throughout transport.

## 2022-12-13 NOTE — Unmapped (Signed)
Infectious Disease ([facility]) Progress Note    Assessment & Plan:   Karen Barber is a 57 y.o. female whose presentation is complicated by bronchiectasis type 1 in the setting of pulmonary sarcoidosis (6L [place] home O2), HTN, T2DM, COPD, s/p LUL lobectomy, and asthma that presented to [facility] admitted to MICU after becoming hypoxic after holding home meds prior to PET scan. After 1 week after diuresing ICU stabilized patient she was transferred to the MPCU.    Principal Problem:    Acute on chronic hypoxic respiratory failure (CMS-HCC)  Active Problems:    Pulmonary sarcoidosis (CMS-HCC)    Type 2 diabetes mellitus with hyperglycemia (CMS-HCC)    COPD (chronic obstructive pulmonary disease) (CMS-HCC)    Chronic pulmonary aspergillosis (CMS-HCC)    Pulmonary hypertension (CMS-HCC)    AKI (acute kidney injury) (CMS-HCC)    Active Problems  Acute on Chronic Hypoxemic Respiratory Failure -   Pulmonary Sarcoidosis -   COPD/asthma -   Bronchiectasis  Patient has notable history of pulmonary sarcoidosis, COPD, pulmonary hypertension and is on baseline oxygen 6 L.  Recent admission with pneumonia growing Serratia marcescens  s/p 1x Rocephin (8/18), Vancomycin (8/18-8/19), Zosyn (8/18-8/23) discharged on levofloxacin 500 mg ending on 8/31.  Patient held her medications only for the morning prior to an outpatient PET CT scan during which she became hypoxic to the 70s and was admitted to the MICU.  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 She received one dose of 125mg  of IV methylprednisolone. She was weaned from high flow nasal cannula to large bore nasal cannula after IV diuresis and a course of antibiotics for possible pneumonia with Vanco/cefepime later transition to levofloxacin with a total 1 week treatment with antibiotics. Started on vanc/cefepime for broad coverage --> stopped vanc and cefepime 9/12- then levaquin (9/12---9/16).  Patient was transferred to the floor on 9/18 after showing significant signs of improvement but still having occasional desaturations with ambulation. PET scan 9/18 is not indicative of active pulmonary sarcoidosis.  Acute exacerbation likely multifactorial including in not taking her home medications as per directed for airway clearance, volume overload, and higher O2 requirement chronically than the amount she has been using. RHC 9/20 showing significant pulm HTN disease with mean PA pressure of 47, for which pulmonology team started Iloprost. Notably, wedge pressure on that study was 5, which is reassuring against volume as driver of her respiratory status at that time.Currently requiring 6-8 L [place] and at this point it is likely she has higher O2 requirement chronically than the amount she has been using at home. This may reflect her new baseline and this was discussed with the patient.   -- plan to get oxymizer for her while admitted and repeat . Will need oxymizer at home  -- pulmonology, appreciate recommendations  -- Continue home airway clearance (DuoNebs and HTS 3 times daily with [place])  -- Continue home Pulmicort, Brovana, Atrovent, as needed albuterol, and azithromycin  -- Lasix PO 80 mg twice daily  -- PT and OT consult     Pulmonary hypertension - RV failure - orthostatic hypotension  Patient has history of pulmonary hypertension due to pulmonary sarcoidosis/COPD.  TTE this admission 9/17 with severely dilated right atrium with severe pulmonary hypertension PASP 86 mmHg.  Estimated right atrial pressure 5 to 10 mmHg.  On Lasix 80 mg twice daily at home.  Echo showed LVEF greater than 55% and severe pulmonary hypertension no significant change since prior study.  Right heart cath showed severe  pulmonary Hypertension, markedly elevated PVR (9.3 - 10.5) and low cardiac output / index. HRCT scan shows unchanged fibrotic pulmonary sarcoidosis with bilateral segmental and subsegmental fibrosis post left upper lobectomy with unchanged apical right upper lobe and superior segment left lower lobe bullae.   -- Continue inhaled iloprost 5 mcg 6 times a day q4h; please do not mix with other nebulizer solutions   -- Lasix 80 mg BID, home Sildenafil 40mg  tid  -- trend pro BNP, strict I/Os, Daily weights  -- Plan to transition to Tyvaso nebs on discharge (arranged with [facility] clinic RN).     Acute kidney injury on CKD3 (downtrending)  On admission patient's creatinine 1.31 which appears to be baseline.  On 9/19 patient was found to have an AKI by definition of > 0.3 increase in creatinine in 48 hours.  -- Renally dose medications  -- Daily BMP    Secondary Hyperparathyroidism, Hypocalcemia, and Hyperphosphatemia  Findings are consistent with secondary hyperparathyroidism due to CKD.  Ionized calcium: 4.37 mg/dL (low)  Phosphate: 5.6 mg/dL (elevated)  PTH: 284.1 pg/mL (elevated)  -- Reconsider need for future oral calcium carbonate 1600 mg TID  -- Reconsider need for future phoslo 667 mg PO TID   -- consider start calcitriol 0.25-0.5 mcg daily pending on vitamin D levels.  -- follow up a 25-hydroxy vitamin D level to guide further supplementation.  -- Reassess PTH levels in 4-6 weeks.    Type 2 diabetes mellitus with hyperglycemia and hypoglycemia  Hemoglobin A1c 8.3%.  Home regimen includes glargine 18 units nightly and sliding scale insulin.  Large fluctuations in glucose greater than 300 and occasional values ~50.  -- Sliding scale insulin insulin sensitivity factor 40  --12 glargine nightly  -- 3 units lispro tid     Left leg swelling (resolved )  Patient endorses intermittent left > right leg swelling. Venous doppler unremarkable.      Chronic Problems     Chronic Pain   - Continuing home gabapentin   - PT/OT consulted, appreciate recs     Chronic pulmonary aspergillosis  Patient has history of aspergilloma and is on chronic posaconazole. Soluble IL-2 receptor, Aspergillus Galactomannan Antigen, IgE checked 10/2022 all wnl  -Continue home posaconazole Ppx  -Outpatient evaluation for [facility] scheduled for 10/25 to evaluate recurrent hemoptysis     Iron deficiency anemia  Chronic normocytic anemia (baseline hemoglobin 11)  Patient has some mild normocytic anemia with hemoglobin of 11. Iron panel shows iron of 37 with iron saturation of 15.   --Iron supplementation with iron dextran 1,000mg  in 250cc    Hyperlipidemia  Last lipid profile showed an LDL of 146  -- Follow-up outpatient    Issues Impacting Complexity of Management:  -The patient is at high risk for the development of complications of volume overload due to the need to provide IV hydration for suspected hypovolemia in the setting of: Pulmonary hypertension  -Need for the following intensive monitoring parameter(s) due to high risk of clinical decline: continuous oxygen monitoring, telemetry, and frequent monitoring of urine output to assess for the efficacy of diuretic regimen  -Need for intensive oxygen therapy of HFNC, which places the patient at high risk for oxygen toxicity  -The patient is at high risk of complications from pulmonary hypertension    Daily Checklist:  Diet: Regular Diet   DVT PPx: Lovenox 40mg  q24h  Electrolytes: Replete Potassium to >/= 3.6 and Magnesium to >/= 1.8  Code Status: DNR and DNI  Dispo:  Continue MPCU  Team Contact Information:   Primary Team: Infectious Disease ([facility])  Primary Resident: Conception Chancy, MD,  Resident's Pager: [phone] (Infect Disease Intern - Tower)    Interval History:   No acute events overnight.    had an episode of desatting to 70s w/o exertion, required 15L NRB to recover, then weaned back down to 8L within the hour. Patient did not feel symptomatic and says she believes it was a poor O2 monitor because she was not SOB.  Chest X-Ray and chest CT completed. Afebrile. Some c/o pain, given PRN tylenol with relief. Pt reported difficulty sleeping, provider notified and ordered PRN melatonin.    ROS negative except for where otherwise noted above.    Objective:   Temp:  [36.3 ??C (97.3 ??F)-36.4 ??C (97.5 ??F)] 36.3 ??C (97.4 ??F)  Heart Rate:  [73-86] 79  SpO2 Pulse:  [72-86] 72  Resp:  [17-36] 17  BP: (88-110)/(46-71) 97/61  FiO2 (%):  [52 %] 52 %  SpO2:  [76 %-100 %] 92 %,   Intake/Output Summary (Last 24 hours) at 12/13/2022 1052  Last data filed at 12/13/2022 0930  Gross per 24 hour   Intake 705 ml   Output 1650 ml   Net -945 ml   , FiO2 (%):  [52 %] 52 %  O2 Device: Large bore nasal cannula (cool high flow)  O2 Flow Rate (L/min):  [6 L/min-15 L/min] 7 L/min    Gen: NAD, converses, appears comfortable on large bore nasal cannula  HENT: atraumatic, normocephalic  Heart: RRR  Lungs: CTAB, no crackles or wheezes  Abdomen: soft, NTND  Extremities: no edema     Umeka Wrench Raelyn Mora, MD   [facility] Internal Medicine PGY-1

## 2022-12-14 LAB — PRO-BNP: PRO-BNP: 3125 pg/mL — ABNORMAL HIGH (ref <=300.0)

## 2022-12-14 LAB — CBC
HEMATOCRIT: 34.8 % (ref 34.0–44.0)
HEMOGLOBIN: 11.5 g/dL (ref 11.3–14.9)
MEAN CORPUSCULAR HEMOGLOBIN CONC: 33 g/dL (ref 32.0–36.0)
MEAN CORPUSCULAR HEMOGLOBIN: 27.3 pg (ref 25.9–32.4)
MEAN CORPUSCULAR VOLUME: 82.6 fL (ref 77.6–95.7)
MEAN PLATELET VOLUME: 9.5 fL (ref 6.8–10.7)
PLATELET COUNT: 198 10*9/L (ref 150–450)
RED BLOOD CELL COUNT: 4.21 10*12/L (ref 3.95–5.13)
RED CELL DISTRIBUTION WIDTH: 17.5 % — ABNORMAL HIGH (ref 12.2–15.2)
WBC ADJUSTED: 4.8 10*9/L (ref 3.6–11.2)

## 2022-12-14 LAB — BLOOD GAS CRITICAL CARE PANEL, VENOUS
BASE EXCESS VENOUS: 6.2 — ABNORMAL HIGH (ref -2.0–2.0)
CALCIUM IONIZED VENOUS (MG/DL): 4.83 mg/dL (ref 4.40–5.40)
GLUCOSE WHOLE BLOOD: 205 mg/dL — ABNORMAL HIGH (ref 70–179)
HCO3 VENOUS: 32 mmol/L — ABNORMAL HIGH (ref 22–27)
HEMOGLOBIN BLOOD GAS: 11.4 g/dL — ABNORMAL LOW
LACTATE BLOOD VENOUS: 4 mmol/L — ABNORMAL HIGH (ref 0.5–1.8)
O2 SATURATION VENOUS: 25.5 % — ABNORMAL LOW (ref 40.0–85.0)
PCO2 VENOUS: 61 mmHg — ABNORMAL HIGH (ref 40–60)
PH VENOUS: 7.34 (ref 7.32–7.43)
PO2 VENOUS: <20 mmHg — ABNORMAL LOW (ref 30–55)
POTASSIUM WHOLE BLOOD: 4.7 mmol/L — ABNORMAL HIGH (ref 3.4–4.6)
SODIUM WHOLE BLOOD: 143 mmol/L (ref 135–145)

## 2022-12-14 LAB — BASIC METABOLIC PANEL
ANION GAP: 5 mmol/L (ref 5–14)
BLOOD UREA NITROGEN: 38 mg/dL — ABNORMAL HIGH (ref 9–23)
BUN / CREAT RATIO: 19
CALCIUM: 9.8 mg/dL (ref 8.7–10.4)
CHLORIDE: 101 mmol/L (ref 98–107)
CO2: 32 mmol/L — ABNORMAL HIGH (ref 20.0–31.0)
CREATININE: 1.97 mg/dL — ABNORMAL HIGH
EGFR CKD-EPI (2021) FEMALE: 29 mL/min/{1.73_m2} — ABNORMAL LOW (ref >=60)
GLUCOSE RANDOM: 194 mg/dL — ABNORMAL HIGH (ref 70–179)
POTASSIUM: 4.5 mmol/L (ref 3.4–4.8)
SODIUM: 138 mmol/L (ref 135–145)

## 2022-12-14 LAB — COMPREHENSIVE METABOLIC PANEL
ALBUMIN: 3.5 g/dL (ref 3.4–5.0)
ALKALINE PHOSPHATASE: 70 U/L (ref 46–116)
ALT (SGPT): 11 U/L (ref 10–49)
ANION GAP: 2 mmol/L — ABNORMAL LOW (ref 5–14)
AST (SGOT): 15 U/L (ref <=34)
BILIRUBIN TOTAL: 0.7 mg/dL (ref 0.3–1.2)
BLOOD UREA NITROGEN: 38 mg/dL — ABNORMAL HIGH (ref 9–23)
BUN / CREAT RATIO: 23
CALCIUM: 9.7 mg/dL (ref 8.7–10.4)
CHLORIDE: 101 mmol/L (ref 98–107)
CO2: 36 mmol/L — ABNORMAL HIGH (ref 20.0–31.0)
CREATININE: 1.64 mg/dL — ABNORMAL HIGH
EGFR CKD-EPI (2021) FEMALE: 36 mL/min/{1.73_m2} — ABNORMAL LOW (ref >=60)
GLUCOSE RANDOM: 128 mg/dL (ref 70–179)
POTASSIUM: 4.4 mmol/L (ref 3.4–4.8)
PROTEIN TOTAL: 6.9 g/dL (ref 5.7–8.2)
SODIUM: 139 mmol/L (ref 135–145)

## 2022-12-14 LAB — IONIZED CALCIUM VENOUS: CALCIUM IONIZED VENOUS (MG/DL): 4.84 mg/dL (ref 4.40–5.40)

## 2022-12-14 LAB — VITAMIN D 25 HYDROXY: VITAMIN D, TOTAL (25OH): 35 ng/mL (ref 20.0–80.0)

## 2022-12-14 MED ADMIN — furosemide (LASIX) tablet 80 mg: 80 mg | ORAL | @ 18:00:00

## 2022-12-14 MED ADMIN — iloprost (VENTAVIS) nebulizer solution: 5 ug | RESPIRATORY_TRACT | @ 19:00:00

## 2022-12-14 MED ADMIN — sildenafiL (pulm.hypertension) (REVATIO) tablet 40 mg: 40 mg | ORAL

## 2022-12-14 MED ADMIN — lactated ringers bolus 250 mL: 250 mL | INTRAVENOUS | @ 22:00:00 | Stop: 2022-12-14

## 2022-12-14 MED ADMIN — gabapentin (NEURONTIN) capsule 300 mg: 300 mg | ORAL | @ 13:00:00

## 2022-12-14 MED ADMIN — enoxaparin (LOVENOX) syringe 40 mg: 40 mg | SUBCUTANEOUS

## 2022-12-14 MED ADMIN — iloprost (VENTAVIS) nebulizer solution: 5 ug | RESPIRATORY_TRACT | @ 13:00:00

## 2022-12-14 MED ADMIN — ipratropium-albuterol (DUO-NEB) 0.5-2.5 mg/3 mL nebulizer solution 3 mL: 3 mL | RESPIRATORY_TRACT | @ 18:00:00

## 2022-12-14 MED ADMIN — insulin glargine (LANTUS) injection 12 Units: 12 [IU] | SUBCUTANEOUS | @ 01:00:00

## 2022-12-14 MED ADMIN — gabapentin (NEURONTIN) capsule 300 mg: 300 mg | ORAL | @ 18:00:00

## 2022-12-14 MED ADMIN — gabapentin (NEURONTIN) capsule 300 mg: 300 mg | ORAL

## 2022-12-14 MED ADMIN — iloprost (VENTAVIS) nebulizer solution: 5 ug | RESPIRATORY_TRACT | @ 16:00:00

## 2022-12-14 MED ADMIN — melatonin tablet 3 mg: 3 mg | ORAL | @ 03:00:00

## 2022-12-14 MED ADMIN — furosemide (LASIX) tablet 80 mg: 80 mg | ORAL | @ 10:00:00

## 2022-12-14 MED ADMIN — magnesium oxide-Mg AA chelate (Magnesium Plus Protein) 2 tablet: 2 | ORAL | @ 13:00:00

## 2022-12-14 MED ADMIN — posaconazole (NOXAFIL) delayed released tablet 200 mg: 200 mg | ORAL | @ 13:00:00

## 2022-12-14 MED ADMIN — sodium chloride 3 % NEBULIZER solution 4 mL: 4 mL | RESPIRATORY_TRACT | @ 12:00:00

## 2022-12-14 MED ADMIN — iloprost (VENTAVIS) nebulizer solution: 5 ug | RESPIRATORY_TRACT | @ 05:00:00

## 2022-12-14 MED ADMIN — insulin lispro (HumaLOG) injection 3 Units: 3 [IU] | SUBCUTANEOUS | @ 16:00:00

## 2022-12-14 MED ADMIN — budesonide (PULMICORT) nebulizer solution 0.5 mg: .5 mg | RESPIRATORY_TRACT | @ 12:00:00

## 2022-12-14 MED ADMIN — iloprost (VENTAVIS) nebulizer solution: 5 ug | RESPIRATORY_TRACT | @ 22:00:00

## 2022-12-14 MED ADMIN — acetaminophen (TYLENOL) tablet 650 mg: 650 mg | ORAL | @ 18:00:00

## 2022-12-14 MED ADMIN — insulin lispro (HumaLOG) injection 0-20 Units: 0-20 [IU] | SUBCUTANEOUS | @ 01:00:00

## 2022-12-14 MED ADMIN — ipratropium-albuterol (DUO-NEB) 0.5-2.5 mg/3 mL nebulizer solution 3 mL: 3 mL | RESPIRATORY_TRACT | @ 12:00:00

## 2022-12-14 MED ADMIN — sildenafiL (pulm.hypertension) (REVATIO) tablet 40 mg: 40 mg | ORAL | @ 18:00:00

## 2022-12-14 MED ADMIN — ipratropium-albuterol (DUO-NEB) 0.5-2.5 mg/3 mL nebulizer solution 3 mL: 3 mL | RESPIRATORY_TRACT | @ 01:00:00

## 2022-12-14 MED ADMIN — sildenafiL (pulm.hypertension) (REVATIO) tablet 40 mg: 40 mg | ORAL | @ 13:00:00

## 2022-12-14 MED ADMIN — sodium chloride 3 % NEBULIZER solution 4 mL: 4 mL | RESPIRATORY_TRACT | @ 01:00:00

## 2022-12-14 MED ADMIN — iloprost (VENTAVIS) nebulizer solution: 5 ug | RESPIRATORY_TRACT | @ 01:00:00

## 2022-12-14 MED ADMIN — azithromycin (ZITHROMAX) tablet 250 mg: 250 mg | ORAL | @ 13:00:00 | Stop: 2025-08-25

## 2022-12-14 MED ADMIN — arformoterol (BROVANA) nebulizer solution 15 mcg/2 mL: 15 ug | RESPIRATORY_TRACT | @ 12:00:00

## 2022-12-14 MED ADMIN — arformoterol (BROVANA) nebulizer solution 15 mcg/2 mL: 15 ug | RESPIRATORY_TRACT | @ 01:00:00

## 2022-12-14 MED ADMIN — budesonide (PULMICORT) nebulizer solution 0.5 mg: .5 mg | RESPIRATORY_TRACT | @ 01:00:00

## 2022-12-14 MED ADMIN — magnesium oxide-Mg AA chelate (Magnesium Plus Protein) 2 tablet: 2 | ORAL

## 2022-12-14 NOTE — Unmapped (Signed)
Pt is A&Ox4, tachypnic in 30's, O2 sats >90% on 8l LBNC, given PRN affrin for nosebleed with relief. Afebrile. No c/o pain. UO adequate, no BM this shift. Pt's appetite has been adequate. Skin intact,  standard precautions maintained. Glycemic management maintained. No falls/injuries this shift. On Lovenox for DVT prophylaxis. All monitors with appropriate alarm settings, call bell within reach, see flowsheets/MAR for further info.          Problem: Adult Inpatient Plan of Care  Goal: Plan of Care Review  Outcome: Ongoing - Unchanged  Goal: Patient-Specific Goal (Individualized)  Outcome: Ongoing - Unchanged  Goal: Absence of Hospital-Acquired Illness or Injury  Outcome: Ongoing - Unchanged  Intervention: Identify and Manage Fall Risk  Recent Flowsheet Documentation  Taken 12/13/2022 2000 by Lavone Neri, RN  Safety Interventions:   aspiration precautions   bleeding precautions   bed alarm   commode/urinal/bedpan at bedside   fall reduction program maintained   infection management   lighting adjusted for tasks/safety   low bed   nonskid shoes/slippers when out of bed   room near unit station   toileting scheduled  Intervention: Prevent Skin Injury  Recent Flowsheet Documentation  Taken 12/14/2022 0200 by Lavone Neri, RN  Positioning for Skin: Left  Taken 12/14/2022 0000 by Lavone Neri, RN  Positioning for Skin: Right  Taken 12/13/2022 2200 by Lavone Neri, RN  Positioning for Skin: Left  Taken 12/13/2022 2000 by Lavone Neri, RN  Positioning for Skin: Sitting in Chair  Device Skin Pressure Protection:   absorbent pad utilized/changed   adhesive use limited   positioning supports utilized   pressure points protected   skin-to-device areas padded   skin-to-skin areas padded   tubing/devices free from skin contact  Skin Protection:   adhesive use limited   incontinence pads utilized   tubing/devices free from skin contact   skin-to-skin areas padded   pulse oximeter probe site changed  Intervention: Prevent Infection  Recent Flowsheet Documentation  Taken 12/13/2022 2000 by Lavone Neri, RN  Infection Prevention:   equipment surfaces disinfected   hand hygiene promoted   personal protective equipment utilized   rest/sleep promoted   single patient room provided  Goal: Optimal Comfort and Wellbeing  Outcome: Ongoing - Unchanged  Goal: Readiness for Transition of Care  Outcome: Ongoing - Unchanged  Goal: Rounds/Family Conference  Outcome: Ongoing - Unchanged     Problem: Fall Injury Risk  Goal: Absence of Fall and Fall-Related Injury  Outcome: Ongoing - Unchanged  Intervention: Promote Injury-Free Environment  Recent Flowsheet Documentation  Taken 12/13/2022 2000 by Lavone Neri, RN  Safety Interventions:   aspiration precautions   bleeding precautions   bed alarm   commode/urinal/bedpan at bedside   fall reduction program maintained   infection management   lighting adjusted for tasks/safety   low bed   nonskid shoes/slippers when out of bed   room near unit station   toileting scheduled     Problem: Gas Exchange Impaired  Goal: Optimal Gas Exchange  Outcome: Ongoing - Unchanged  Intervention: Optimize Oxygenation and Ventilation  Recent Flowsheet Documentation  Taken 12/14/2022 0200 by Lavone Neri, RN  Head of Bed [facility]) Positioning: HOB at 20-30 degrees  Taken 12/14/2022 0015 by Lavone Neri, RN  Head of Bed [facility]) Positioning: HOB at 20-30 degrees  Taken 12/14/2022 0000 by Lavone Neri, RN  Head of Bed [facility]) Positioning: HOB at 20-30 degrees  Taken 12/13/2022 2200 by Lavone Neri, RN  Head of Bed [facility]) Positioning: [facility]  at 30-45 degrees  Taken 12/13/2022 2000 by Lavone Neri, RN  Head of Bed [facility]) Positioning: HOB at 30-45 degrees  Taken 12/13/2022 1944 by Lavone Neri, RN  Head of Bed [facility]) Positioning: HOB at 60-90 degrees     Problem: Comorbidity Management  Goal: Blood Glucose Levels Within Targeted Range  Outcome: Ongoing - Unchanged  Goal: Blood Pressure in Desired Range  Outcome: Ongoing - Unchanged

## 2022-12-14 NOTE — Unmapped (Signed)
Pulmonary Consult Service  Consult Follow Up Note      Primary Service:   Medicine  Primary Service Attending:  Ebony Hail, MD  Reason for Consult:   Assistance with diuresis    ASSESSMENT and PLAN     Karen Barber is a 57 y.o. 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 Pt was found to have acute on chronic respiratory failure (sat in 70s on home 6L) in the setting of not taking her home medications (sildenafil, lasix, nebs, inhalers) in preparation for PET scan, admitted 9/11. She was admitted to the ICU initially, has since been weaned back to 6-8L [place] with airway clearance and diuresis.     Her acute on chronic hypoxemia and increase O2 requirement was likely multifactorial -- combination of volume overload as well as lack of airway clearance at home, however I think chronically her O2 requirement (related to her PH) is higher than the amount she has been on at home and she continues to have severe exertional hypoxemia.    Acute on Chronic Hypoxemic Respiratory Failure  COPD/Asthma  Pulmonary Sarcoidosis  Bronchiectasis s/s sarcoid  Soluble IL-2 receptor, Aspergillus Galactomannan Antigen, IgE checked 10/2022 all wnl. PET scan 9/17 does not appear to be active pulmonary sarcoidosis and again it is overall felt to be burned out from sarcoidosis standpoint. Patient says this is the best that she's felt since coming to the hospital. Unable to complete due to recurrent nosebleeds and required 15L via NRB. Currently requiring 7-8 L [place] with appropriate airway clearance. It is likely she has higher O2 requirement chronically than the amount she has been using at home. This may reflect her new baseline and this was discussed with the patient.   Plan:  - continue home pulmicort, brovana, PRN albuterol, azithromycin  - Home with 6L and oxymizer  - please cont airway clearance:   - duonebs TID and HTS (3%) BID with aerobika    RV Failure  PAH (Group 3, 5)  TTE 9/17 showed severe pulmonary HTN (PASP ), severely dilated RV with mildly reduced systolic function, moderately dilated RA. Likely multifactorial in the setting of known COPD, pulmonary sarcoidosis. Pro-BNP still elevated at 3,125. Since admission cumulatively -16.2 L and pt appears to be euvolemic on exam. RHC 9/20 demonstrated severe pulmonary hypertension, markedly elevated PVR (9.3-10.5) and low cardiac output/index.  Plan:  - Plan to transition to Tyvaso nebs on discharge (arranged with PH clinic RN).   - HRCT completed for PH-ILD diagnosis and [facility] enrollment sent to [facility]   - [facility] rep to do teaching tomorrow prior to DC  - Continue sildenafil 40 mg  - Continue inhaled iloprost 5 mcg q4h. Please do not mix with other nebulizer solutions  - Continue furosemide (80 mg BID PO)   - Continue daily weight with strict I/O  - cont daily pro-BNP    Chronic Pulmonary Aspergillosis   Aspergillus Galactomannan Antigen, IgE checked 10/2022 all wnl.   - continue home posaconazole   - pt has outpt evaluation for [facility] scheduled for 10/25 for history of recurrent hemoptysis    Karen Barber was seen, examined and discussed with  Dr. Nada Libman  Thank you for involving [place] in her care. We look forward to following with you.  Recommendations were  communicated to the primary team at 10:14 AM  Please page [place] with any questions or concerns at [phone] (pulmonary consult fellow).    Armed forces training and education officer  Medical Student      HISTORY:     History of Present Illness:  Karen Barber is a 57 y.o. female with hx pf pulmonary sarcoidosis, COPD, PAH (baseline home O2 6L), chronic pulmonary aspergillosis, s/p LUL lobectomy 2016 who initially presented to the hospital for a PET scan to evaluate her sarcoidosis.     Pt was found to have acute on chronic respiratory failure (sat in 70s on home 6L) in the setting of not taking her home medications (sildenafil, lasix, nebs, inhalers) in preparation for PET scan. As her respiratory status did not improve with NRB, placed on HFNC. 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 She received one dose of 125mg  of IV methylprednisolone and then was transferred to the MICU for further management of her hypoxia. Pt had a week long stay in the MICU and was weaned from HFNC to [facility] after IV diuresis and course of antibiotics (completed course of levaquin) for possible pneumonia. Pt has been continuing home airway clearance (Aerobika and HTS neb), and home pulmicort, brovana, atrovent, and PRN albuterol. Showing significant signs of improvement but still having desaturations with ambulation.     Pt started iloprost 9/19 and has tolerated it well so far. RHC 9/20 demonstrated severe pulmonary hypertension, markedly elevated PVR (9.3-10.5) and low cardiac output/index. HRCT chest showed unchanged fibrotic pulmonary sarcoidosis with bilateral segmental and subsegmental fibrosis post left upper lobectomy with unchanged apical right upper lobe and superior segment left lower lobe bullae.     Of note, has been hospitalized twice prior to this in the past 6 weeks.  8/18-8/25: presented with increased O2 requirement, increased swelling, brown sputum production.   - treated for serratia pneumonia (completed levaquin)  - Was discharged on AVAPS for home, as well as with HTS/albuterol and [place].  10/21/22-10/25/22: presented with DOE, up to 8-10L with exertion and desats to 70%.   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 A.niger from sputum 08/2016-09/2016.    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    Sarcoidosis 1995    Type 2 diabetes mellitus with hyperglycemia (CMS-HCC) 07/27/2014    Visual impairment     glasses     Past Surgical History:   Procedure Laterality Date    LUNG LOBECTOMY      PR AMPUTATION TOE,MT-P JT Right 12/12/2019  Procedure: Right foot fifth toe amputation at the metatarsophalangeal joint level;  Surgeon: Britt Bottom, DPM;  Location: MAIN OR [facility];  Service: Vascular    PR AMPUTATION TOE,MT-P JT Right 06/16/2020    Procedure: Right foot amputation of 3rd toe at mpj level;  Surgeon: Britt Bottom, DPM;  Location: MAIN OR [facility];  Service: Vascular    PR RIGHT HEART CATH O2 SATURATION & CARDIAC OUTPUT N/A 04/29/2019    Procedure: Right Heart Catheterization;  Surgeon: Rosana Hoes, MD;  Location: [facility] CATH;  Service: Cardiology    PR RIGHT HEART CATH O2 SATURATION & CARDIAC OUTPUT N/A 12/08/2022    Procedure: Right Heart Catheterization;  Surgeon: Neal Dy, MD;  Location: [facility] CATH;  Service: Cardiology    PR THORACOSCOPY SURG TOT PULM DECORT Left 12/14/2014    Procedure: THORACOSCOPY SURG; W/TOT PULM DECORTIC/PNEUMOLYS;  Surgeon: Evert Kohl, MD;  Location: MAIN OR [facility];  Service: Cardiothoracic    PR THORACOSCOPY W/THERA WEDGE RESEXN INITIAL UNILAT Left 12/03/2014    Procedure: THORACOSCOPY, SURGICAL; WITH THERAPEUTIC WEDGE RESECTION (EG, MASS, NODULE) INITIAL UNILATERAL;  Surgeon: Evert Kohl, MD;  Location: MAIN OR [facility];  Service: Cardiothoracic       Other History:  Family History   Problem Relation Age of Onset    Dementia Mother     Diabetes Father     Diabetes Brother     Diabetes Maternal Aunt     Sarcoidosis Maternal Aunt     Diabetes Maternal Uncle     Diabetes Paternal Aunt     Diabetes Paternal Uncle      Social History     Socioeconomic History    Marital status: Single   Tobacco Use    Smoking status: Former     Current packs/day: 0.00     Average packs/day: 0.5 packs/day for 9.0 years (4.5 ttl pk-yrs)     Types: Cigarettes     Start date: 05/02/1978     Quit date: 05/03/1987     Years since quitting: 35.6    Smokeless tobacco: Never   Vaping Use    Vaping status: Never Used   Substance and Sexual Activity    Alcohol use: No     Alcohol/week: 0.0 standard drinks of alcohol    Drug use: No    Sexual activity: Yes   Other Topics Concern    Do you use sunscreen? No    Tanning bed use? No    Are you easily burned? No    Excessive sun exposure? No    Blistering sunburns? No     Social Determinants of Health     Financial Resource Strain: Low Risk  (11/06/2022)    Overall Financial Resource Strain (CARDIA)     Difficulty of Paying Living Expenses: Not hard at all   Food Insecurity: No Food Insecurity (11/06/2022)    Hunger Vital Sign     Worried About Running Out of Food in the Last Year: Never true     Ran Out of Food in the Last Year: Never true   Transportation Needs: No Transportation Needs (11/06/2022)    PRAPARE - Therapist, art (Medical): No     Lack of Transportation (Non-Medical): No       Home Medications:  No current facility-administered medications on file prior to encounter.     Current Outpatient Medications on File Prior to Encounter   Medication Sig Dispense Refill    ACCU-CHEK  GUIDE GLUCOSE METER Misc 1 Device by Other route Four (4) times a day (before meals and nightly). AS DIRECTED 1 kit 0    albuterol 2.5 mg /3 mL (0.083 %) nebulizer solution Inhale 3 mL (2.5 mg total) by nebulization every six (6) hours as needed for wheezing or shortness of breath (airway clearance/cough). 360 mL 3    albuterol HFA 90 mcg/actuation inhaler Inhale 2 puffs every four (4) hours as needed for wheezing or shortness of breath. 54 g 0    arformoterol (BROVANA) 15 mcg/2 mL Inhale 2 mL (15 mcg total) by nebulization in the morning and 2 mL (15 mcg total) in the evening. 120 mL 2    aspirin-caffeine (BAYER BACK AND BODY) 500-32.5 mg Tab Take by mouth. Takes 2 1/2 pills 3-4 times a day as needed.      azithromycin (ZITHROMAX) 250 MG tablet Take 1 tablet (250 mg total) by mouth daily. Start on 9/1 after completing the levofloxacin 90 tablet 0    blood sugar diagnostic Strp by Other route Three (3) times a day. ACCU-CHEK Guide meter 300 strip 1    budesonide (PULMICORT) 0.5 mg/2 mL nebulizer solution Inhale 2 mL (0.5 mg total) by nebulization in the morning and 2 mL (0.5 mg total) in the evening. 120 mL 2    furosemide (LASIX) 80 MG tablet Take 1 tablet (80 mg total) by mouth two (2) times a day. Take a third dose as needed for increasing weight or increasing swelling. 60 tablet 0    gabapentin (NEURONTIN) 300 MG capsule Take 1 capsule (300 mg total) by mouth Three (3) times a day. 270 capsule 3    insulin aspart (NOVOLOG U-100 INSULIN ASPART) 100 unit/mL vial Inject 0.01-0.2 mL (1-20 Units total) under the skin Four (4) times a day (before meals and nightly). SLIDING SCALE 20 mL 1    insulin glargine (BASAGLAR KWIKPEN U-100 INSULIN) 100 unit/mL (3 mL) injection pen Inject 0.18 mL (18 Units total) under the skin nightly. 15 mL 0    ipratropium (ATROVENT) 0.02 % nebulizer solution Inhale 2.5 mL (500 mcg total) by nebulization four (4) times a day. 300 mL 0    posaconazole (NOXAFIL) 100 mg delayed released tablet Take 2 tablets by mouth once daily 60 tablet 0    sildenafiL, pulm.hypertension, (REVATIO) 20 mg tablet Take 1 tablet (20 mg total) by mouth Three (3) times a day. 90 tablet 0    OXYGEN-AIR DELIVERY SYSTEMS MISC Dose: 2-3 LPM         In-Hospital Medications:  Scheduled:   arformoterol  15 mcg Nebulization BID (RT)    azithromycin  250 mg Oral Daily    budesonide  0.5 mg Nebulization BID (RT)    enoxaparin (LOVENOX) injection  40 mg Subcutaneous Nightly    flu vacc [id number] 6mos up(PF)  0.5 mL Intramuscular During hospitalization    furosemide  80 mg Oral BID    gabapentin  300 mg Oral TID    iloprost  5 mcg Nebulization 6XD    insulin glargine  12 Units Subcutaneous Nightly    insulin lispro  0-20 Units Subcutaneous ACHS    insulin lispro  3 Units Subcutaneous 3xd Meals    ipratropium-albuterol  3 mL Nebulization TID (RT)    magnesium oxide-Mg AA chelate  2 tablet Oral BID    polyethylene glycol  17 g Oral Daily    posaconazole  200 mg Oral Daily    sildenafiL (pulm.hypertension)  40 mg Oral  TID    sodium chloride  4 mL Nebulization BID (RT)     Continuous Infusions:      PRN Medications:  acetaminophen, albuterol, calcium carbonate, dextrose in water, glucagon, melatonin, [EXPIRED] Implement **AND** [EXPIRED] Care order/instruction **AND** [EXPIRED] Vital signs **AND** Adult Oxygen therapy **AND** [EXPIRED] sodium chloride **AND** [EXPIRED] sodium chloride 0.9% **AND** [EXPIRED] diphenhydrAMINE **AND** [EXPIRED] famotidine **AND** methylPREDNISolone sodium succinate **AND** [EXPIRED] EPINEPHrine, ondansetron **OR** ondansetron, oxymetazoline, senna, sodium chloride    Allergies:  Allergies as of 11/29/2022    (No Known Allergies)         PHYSICAL EXAM:   BP 110/63  - Pulse 78  - Temp 36.4 ??C (97.5 ??F) (Oral)  - Resp 26  - Ht 180.3 cm (5' 11)  - Wt 86.4 kg (190 lb 7.6 oz)  - LMP  (LMP Unknown)  - SpO2 98%  - BMI 26.57 kg/m??   General: Alert, well-appearing, and in no distress.  Eyes: Anicteric sclera, conjunctiva clear.  ENT:   Nares normal, septum midline, mucosa normal, no drainage.  Cardiovascular: Regular rate and rhythm.  Pulmonary: Diminished lung sounds in bilateral upper lobe, crackles bilateral bases, no wheezing or rhonchi   Musculoskeletal: No clubbing.  Skin: No rashes or lesions., Mild swelling, L>R  Neuro: No focal neurological deficits.    LABORATORY and RADIOLOGY DATA:     Pertinent Laboratory Data:  Lab Results   Component Value Date    WBC 4.0 12/11/2022    HGB 10.7 (L) 12/11/2022    HCT 32.7 (L) 12/11/2022    PLT 166 12/11/2022       Lab Results   Component Value Date    NA 139 12/09/2022    K 4.4 12/09/2022    CL 101 12/09/2022    CO2 35.0 (H) 12/09/2022    BUN 35 (H) 12/09/2022    CREATININE 1.51 (H) 12/09/2022    GLU 112 12/09/2022    CALCIUM 9.8 12/09/2022    MG 2.3 12/11/2022    PHOS 5.0 12/11/2022       Lab Results   Component Value Date    BILITOT 0.4 12/04/2022    BILIDIR <0.10 05/30/2019    PROT 6.1 12/04/2022    ALBUMIN 2.8 (L) 12/04/2022    ALT 8 (L) 12/04/2022    AST 8 12/04/2022    ALKPHOS 63 12/04/2022    GGT 60 (H) 05/31/2011       Lab Results   Component Value Date    INR 1.02 12/11/2019    APTT 30.9 12/11/2019       Pertinent Micro Data:  Microbiology Results (last day)       ** No results found for the last 24 hours. **             Pertinent Imaging Data:  9/11 CXR   - Extensive fibrotic changes and biapical bulla appear grossly unchanged. Extensive pulmonary fibrosis limits evaluation for superimposed infection.     9/17 Echo    Summary    1. Limited study.    2. The left ventricle is normal in size with normal wall thickness.    3. The left ventricular systolic function is normal, LVEF is visually  estimated at > 55%.    4. The right ventricle is severely dilated in size, with mildly reduced  systolic function.    5. There is severe pulmonary hypertension.    6. TR maximum velocity: 4.4 m/s  Estimated PASP: 86 mmHg.    7. IVC  size and inspiratory change suggest mildly elevated right atrial  pressure. (5-10 mmHg).    8. No significant change since prior study from 10/23/2022    9/18 PET CT  Impression   Bulky, partially calcified lymph nodes in the mediastinum and bilateral hilar with no significant to mild uptake, decreased in intensity since prior.   Grossly unchanged metabolically active right supraclavicular lymph nodes, accounting for differences in positioning of the arms and lack of IV contrast.   Bronchiectasis and partially metabolically active reticular opacities are seen bilaterally, most prominent in the lower lobes, overall similar to prior.   Dilated main pulmonary artery measuring up to 4.0 cm, suggestive of pulmonary arterial hypertension.     9/20 RHC  FINAL CARDIAC CATHETERIZATION REPORT    CONCLUSIONS:  - Severe pulmonary Hypertension  - Markedly elevated PVR (9.3 - 10.5)  - Low cardiac output / index     9/25 CXR  No significant interval change since the prior chest radiograph.     9/25 CT Chest High Resolution w/o contrast  Unchanged fibrotic pulmonary sarcoidosis with bilateral segmental and subsegmental fibrosis post left upper lobectomy with unchanged apical right upper lobe and superior segment left lower lobe bullae.

## 2022-12-14 NOTE — Unmapped (Signed)
Assessment: Karen Barber is a 57 y.o. female who was admitted for Acute on chronic hypoxic respiratory failure (CMS-HCC)       Shift Events: Patient remained A&Ox4, with HR 70-90s, SBP >85, and afebrile. On LBNC 6-8L until the afternoon when the patient began experiencing recurrent nosebleeds that required the intermittent use of venturi mask at 80% FIO2. Nosebleeds x3 managed with PRN afrin and one-time TXA solution. Patient back on [facility] @ 8L at 1730 to eat dinner. No complaints of pain. Adequate UO. No BM. Rounds completed q2H.    Current Hospital Length of Stay: 14 Days    INFUSIONS:      VITALS:  Vitals:    12/12/22 2322 12/13/22 0600   Weight: 84.6 kg (186 lb 8.2 oz) 86.4 kg (190 lb 7.6 oz)      Weight change: -6.1 kg (-13 lb 7.2 oz)     Temp:  [36.1 ??C (97 ??F)-36.4 ??C (97.5 ??F)] 36.1 ??C (97 ??F)  Heart Rate:  [73-89] 86  SpO2 Pulse:  [72-89] 84  Resp:  [17-34] 24  BP: (88-108)/(46-71) 108/62  MAP (mmHg):  [64-78] 76  FiO2 (%):  [80 %] 80 %  SpO2:  [76 %-100 %] 98 %     VENT/OXYGEN SETTINGS:  FiO2 (%): 80 %    INS & OUTS:  Date 12/12/22 1901 - 12/13/22 0700 12/13/22 0701 - 12/14/22 0700   Shift 1901-0700 24 Hour Total 0701-1900 1901-0700 24 Hour Total   INTAKE   P.O.  520 30  30   I.V. 60 355      Shift Total 60 875 30  30   OUTPUT   Urine 450 1300 900  900   Emesis/NG output   0  0   Shift Total 450 1300 900  900     Intake/Output Summary (Last 24 hours) at 12/13/2022 1718  Last data filed at 12/13/2022 1600  Gross per 24 hour   Intake 90 ml   Output 1350 ml   Net -1260 ml        ACCESS:  Patient Lines/Drains/Airways Status       Active Active Lines, Drains, & Airways       Name Placement date Placement time Site Days    External Urinary Device 12/02/22 With Suction 12/02/22  1900  -- 10    Peripheral IV 12/11/22 Left;Posterior Forearm 12/11/22  1400  Forearm  2                           See flowsheets/MAR for further information      Virgia Land, RN  December 13, 2022 5:18 PM      Problem: Adult Inpatient Plan of Care  Goal: Plan of Care Review  Outcome: Progressing  Goal: Patient-Specific Goal (Individualized)  Outcome: Progressing  Goal: Absence of Hospital-Acquired Illness or Injury  Outcome: Progressing  Intervention: Identify and Manage Fall Risk  Recent Flowsheet Documentation  Taken 12/13/2022 0800 by Virgia Land, RN  Safety Interventions:   aspiration precautions   bleeding precautions   commode/urinal/bedpan at bedside   bed alarm   fall reduction program maintained   lighting adjusted for tasks/safety   low bed   infection management   nonskid shoes/slippers when out of bed   supervised activity  Intervention: Prevent Skin Injury  Recent Flowsheet Documentation  Taken 12/13/2022 1600 by Virgia Land, RN  Positioning for Skin: Sitting in Chair  Taken 12/13/2022  1400 by Virgia Land, RN  Positioning for Skin: Supine/Back  Taken 12/13/2022 1200 by Virgia Land, RN  Positioning for Skin: (sitting up in bed) Other (Comment)  Taken 12/13/2022 1000 by Virgia Land, RN  Positioning for Skin: (sitting up in bed) Other (Comment)  Taken 12/13/2022 0800 by Virgia Land, RN  Positioning for Skin: (sitting up in bed) Other (Comment)  Device Skin Pressure Protection:   positioning supports utilized   skin-to-skin areas padded   skin-to-device areas padded   tubing/devices free from skin contact   absorbent pad utilized/changed  Skin Protection:   adhesive use limited   incontinence pads utilized   skin-to-device areas padded   skin-to-skin areas padded   tubing/devices free from skin contact  Intervention: Prevent Infection  Recent Flowsheet Documentation  Taken 12/13/2022 0800 by Virgia Land, RN  Infection Prevention:   single patient room provided   rest/sleep promoted   personal protective equipment utilized   hand hygiene promoted   equipment surfaces disinfected   environmental surveillance performed  Goal: Optimal Comfort and Wellbeing  Outcome: Progressing  Goal: Readiness for Transition of Care  Outcome: Progressing  Goal: Rounds/Family Conference  Outcome: Progressing     Problem: Fall Injury Risk  Goal: Absence of Fall and Fall-Related Injury  Outcome: Progressing  Intervention: Promote Scientist, clinical (histocompatibility and immunogenetics) Documentation  Taken 12/13/2022 0800 by Virgia Land, RN  Safety Interventions:   aspiration precautions   bleeding precautions   commode/urinal/bedpan at bedside   bed alarm   fall reduction program maintained   lighting adjusted for tasks/safety   low bed   infection management   nonskid shoes/slippers when out of bed   supervised activity     Problem: Gas Exchange Impaired  Goal: Optimal Gas Exchange  Outcome: Progressing  Intervention: Optimize Oxygenation and Ventilation  Recent Flowsheet Documentation  Taken 12/13/2022 0800 by Virgia Land, RN  Head of Bed (HOB) Positioning: HOB at 45 degrees     Problem: Comorbidity Management  Goal: Blood Glucose Levels Within Targeted Range  Outcome: Progressing     Problem: Comorbidity Management  Goal: Blood Pressure in Desired Range  Outcome: Ongoing - Unchanged

## 2022-12-14 NOTE — Unmapped (Signed)
Patient remained on [facility] this shift. Breathing treatments administered this shift with no adverse reactions noted.

## 2022-12-14 NOTE — Unmapped (Signed)
Infectious Disease ([facility]) Progress Note    Assessment & Plan:   Karen Barber is a 57 y.o. female whose presentation is complicated by bronchiectasis type 1 in the setting of pulmonary sarcoidosis (6L [place] home O2), HTN, T2DM, COPD, s/p LUL lobectomy, and asthma that presented to [facility] admitted to MICU after becoming hypoxic after holding home meds prior to PET scan. After 1 week after diuresing ICU stabilized patient she was transferred to the MPCU.    Principal Problem:    Acute on chronic hypoxic respiratory failure (CMS-HCC)  Active Problems:    Pulmonary sarcoidosis (CMS-HCC)    Type 2 diabetes mellitus with hyperglycemia (CMS-HCC)    COPD (chronic obstructive pulmonary disease) (CMS-HCC)    Chronic pulmonary aspergillosis (CMS-HCC)    Pulmonary hypertension (CMS-HCC)    AKI (acute kidney injury) (CMS-HCC)    Active Problems  Acute on Chronic Hypoxemic Respiratory Failure -   Pulmonary Sarcoidosis -   COPD/asthma -   Bronchiectasis  Patient has notable history of pulmonary sarcoidosis, COPD, pulmonary hypertension and is on baseline oxygen 6 L.  Recent admission with pneumonia growing Serratia marcescens  s/p 1x Rocephin (8/18), Vancomycin (8/18-8/19), Zosyn (8/18-8/23) discharged on levofloxacin 500 mg ending on 8/31.  Patient held her medications only for the morning prior to an outpatient PET CT scan during which she became hypoxic to the 70s and was admitted to the MICU.  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 She received one dose of 125mg  of IV methylprednisolone. She was weaned from high flow nasal cannula to large bore nasal cannula after IV diuresis and a course of antibiotics for possible pneumonia with Vanco/cefepime later transition to levofloxacin with a total 1 week treatment with antibiotics. Started on vanc/cefepime for broad coverage --> stopped vanc and cefepime 9/12- then levaquin (9/12---9/16).  Patient was transferred to the floor on 9/18 after showing significant signs of improvement but still having occasional desaturations with ambulation. PET scan 9/18 is not indicative of active pulmonary sarcoidosis.  Acute exacerbation likely multifactorial including in not taking her home medications as per directed for airway clearance, volume overload, and higher O2 requirement chronically than the amount she has been using. RHC 9/20 showing significant pulm HTN disease with mean PA pressure of 47, for which pulmonology team started Iloprost. Notably, wedge pressure on that study was 5, which is reassuring against volume as driver of her respiratory status at that time.Currently requiring 6-8 L [place] and at this point it is likely she has higher O2 requirement chronically than the amount she has been using at home. This may reflect her new baseline and this was discussed with the patient.   -- plan to get oxymizer for her while admitted and repeat . Will need oxymizer at home  -- pulmonology, appreciate recommendations  -- Continue home airway clearance (DuoNebs and HTS 3 times daily with [place])  -- Continue home Pulmicort, Brovana, Atrovent, as needed albuterol, and azithromycin  -- Lasix PO 80 mg twice daily  -- PT and OT consult     Pulmonary hypertension - RV failure - orthostatic hypotension  Patient has history of pulmonary hypertension due to pulmonary sarcoidosis/COPD.  TTE this admission 9/17 with severely dilated right atrium with severe pulmonary hypertension PASP 86 mmHg.  Estimated right atrial pressure 5 to 10 mmHg.  On Lasix 80 mg twice daily at home.  Echo showed LVEF greater than 55% and severe pulmonary hypertension no significant change since prior study.  Right heart cath showed severe  pulmonary Hypertension, markedly elevated PVR (9.3 - 10.5) and low cardiac output / index. HRCT scan shows unchanged fibrotic pulmonary sarcoidosis with bilateral segmental and subsegmental fibrosis post left upper lobectomy with unchanged apical right upper lobe and superior segment left lower lobe bullae.   -- Continue inhaled iloprost 5 mcg 6 times a day q4h; please do not mix with other nebulizer solutions   -- Lasix 80 mg BID, home Sildenafil 40mg  tid  -- trend pro BNP, strict I/Os, Daily weights  -- Plan to transition to Tyvaso nebs on discharge (arranged with [facility] clinic RN).     Acute kidney injury on CKD3 (downtrending)  On admission patient's creatinine 1.31 which appears to be baseline.  On 9/19 patient was found to have an AKI by definition of > 0.3 increase in creatinine in 48 hours.  -- Renally dose medications  -- Daily BMP    Secondary Hyperparathyroidism, Hypocalcemia, and Hyperphosphatemia  Findings are consistent with secondary hyperparathyroidism due to CKD.  Ionized calcium: 4.37 mg/dL (low)  Phosphate: 5.6 mg/dL (elevated)  PTH: 098.1 pg/mL (elevated)  -- Reconsider need for future oral calcium carbonate 1600 mg TID  -- Reconsider need for future phoslo 667 mg PO TID   -- consider start calcitriol 0.25-0.5 mcg daily pending on vitamin D levels.  -- follow up a 25-hydroxy vitamin D level to guide further supplementation.  -- Reassess PTH levels in 4-6 weeks.    Type 2 diabetes mellitus with hyperglycemia and hypoglycemia  Hemoglobin A1c 8.3%.  Home regimen includes glargine 18 units nightly and sliding scale insulin.  Large fluctuations in glucose greater than 300 and occasional values ~50.  -- Sliding scale insulin insulin sensitivity factor 40  --12 glargine nightly  -- 3 units lispro tid     Left leg swelling (resolved )  Patient endorses intermittent left > right leg swelling. Venous doppler unremarkable.      Chronic Problems     Chronic Pain   - Continuing home gabapentin   - PT/OT consulted, appreciate recs     Chronic pulmonary aspergillosis  Patient has history of aspergilloma and is on chronic posaconazole. Soluble IL-2 receptor, Aspergillus Galactomannan Antigen, IgE checked 10/2022 all wnl  -Continue home posaconazole Ppx  -Outpatient evaluation for [facility] scheduled for 10/25 to evaluate recurrent hemoptysis     Iron deficiency anemia  Chronic normocytic anemia (baseline hemoglobin 11)  Patient has some mild normocytic anemia with hemoglobin of 11. Iron panel shows iron of 37 with iron saturation of 15.   --Iron supplementation with iron dextran 1,000mg  in 250cc    Hyperlipidemia  Last lipid profile showed an LDL of 146  -- Follow-up outpatient    Issues Impacting Complexity of Management:  -The patient is at high risk for the development of complications of volume overload due to the need to provide IV hydration for suspected hypovolemia in the setting of: Pulmonary hypertension  -Need for the following intensive monitoring parameter(s) due to high risk of clinical decline: continuous oxygen monitoring, telemetry, and frequent monitoring of urine output to assess for the efficacy of diuretic regimen  -Need for intensive oxygen therapy of HFNC, which places the patient at high risk for oxygen toxicity  -The patient is at high risk of complications from pulmonary hypertension    Daily Checklist:  Diet: Regular Diet   DVT PPx: Lovenox 40mg  q24h  Electrolytes: Replete Potassium to >/= 3.6 and Magnesium to >/= 1.8  Code Status: DNR and DNI  Dispo:  Continue MPCU  Team Contact Information:   Primary Team: Infectious Disease ([facility])  Primary Resident: Conception Chancy, MD,  Resident's Pager: [phone] (Infect Disease Intern - Tower)    Interval History:   No acute events overnight.    Patient complains of burning with higher oxygen requirement.  Patient also states that when oxygen saturation level drops she feels fine and attributes it to bad plethora reading.    ROS negative except for where otherwise noted above.    Objective:   Temp:  [36.1 ??C (97 ??F)-36.7 ??C (98.1 ??F)] 36.3 ??C (97.3 ??F)  Heart Rate:  [76-91] 82  SpO2 Pulse:  [77-91] 86  Resp:  [16-42] 16  BP: (97-126)/(62-95) 97/64  FiO2 (%):  [80 %] 80 %  SpO2:  [89 %-100 %] 89 %,   Intake/Output Summary (Last 24 hours) at 12/14/2022 1135  Last data filed at 12/14/2022 0900  Gross per 24 hour   Intake 30 ml   Output 1490 ml   Net -1460 ml   , FiO2 (%):  [80 %] 80 %  O2 Device: Large bore nasal cannula (cool high flow)  O2 Flow Rate (L/min):  [7 L/min-12 L/min] 7 L/min    Gen: NAD, converses, appears comfortable on large bore nasal cannula  HENT: atraumatic, normocephalic  Heart: RRR  Lungs: CTAB, no crackles or wheezes  Abdomen: soft, NTND  Extremities: no edema     Janely Gullickson Raelyn Mora, MD   [facility] Internal Medicine PGY-1

## 2022-12-14 NOTE — Unmapped (Addendum)
Azole Antifungal Therapeutic Monitoring Pharmacy Note    Karen Barber is a 57 y.o. female continuing posaconazole.     Indication:  Fungal ppx for chronic pulmonary aspergillosis    Prior Dosing Information: Home regimen posaconazole DR 200 mg daily     Goals:  Therapeutic Drug Levels  Trough level:  > 700 ng/mL (ppx)     Additional Clinical Monitoring/Outcomes  Monitor QTc, renal function (SCr and UOP), and liver function (LFTs)    Results:  Posaconazole level:  606 mcg/mL, drawn ~24.5 hr lvl       Wt Readings from Last 3 Encounters:   12/13/22 86.4 kg (190 lb 7.6 oz)   11/11/22 90.1 kg (198 lb 10.2 oz)   10/31/22 84.8 kg (187 lb)     Lab Results   Component Value Date    BILITOT 0.4 12/04/2022    ALBUMIN 2.8 (L) 12/04/2022    ALT 8 (L) 12/04/2022    AST 8 12/04/2022       Pharmacokinetic Considerations and Significant Drug Interactions:  Concurrent hepatotoxic medications: None identified  Concurrent QTc-prolonging medications:  azithromycin 250 mg daily  Concurrent CYP3A4 substrates/inhibitors: None identified    Assessment/Plan:  Recommendation(s)  Continue current regimen of posaconazole DR 200 mg PO daily, is close to goal & had previously been supratherapeutic on this dose. In addition, per last ID clinic note, posaconazole DR 200 mg daily was continued at this maintenance dose pending risk vs benefits of stopping.     Follow-up  No further levels indicated at this time.   A pharmacist will continue to monitor and recommend levels as appropriate    Please page service pharmacist with questions/clarifications.    Asencion Partridge, PharmD, RPh

## 2022-12-15 LAB — CBC
HEMATOCRIT: 34.8 % (ref 34.0–44.0)
HEMOGLOBIN: 11.4 g/dL (ref 11.3–14.9)
MEAN CORPUSCULAR HEMOGLOBIN CONC: 32.9 g/dL (ref 32.0–36.0)
MEAN CORPUSCULAR HEMOGLOBIN: 27.1 pg (ref 25.9–32.4)
MEAN CORPUSCULAR VOLUME: 82.5 fL (ref 77.6–95.7)
MEAN PLATELET VOLUME: 8 fL (ref 6.8–10.7)
PLATELET COUNT: 171 10*9/L (ref 150–450)
RED BLOOD CELL COUNT: 4.21 10*12/L (ref 3.95–5.13)
RED CELL DISTRIBUTION WIDTH: 17.7 % — ABNORMAL HIGH (ref 12.2–15.2)
WBC ADJUSTED: 4.2 10*9/L (ref 3.6–11.2)

## 2022-12-15 LAB — RENAL FUNCTION PANEL
ALBUMIN: 3.6 g/dL (ref 3.4–5.0)
ANION GAP: 6 mmol/L (ref 5–14)
BLOOD UREA NITROGEN: 41 mg/dL — ABNORMAL HIGH (ref 9–23)
BUN / CREAT RATIO: 22
CALCIUM: 10 mg/dL (ref 8.7–10.4)
CHLORIDE: 100 mmol/L (ref 98–107)
CO2: 35 mmol/L — ABNORMAL HIGH (ref 20.0–31.0)
CREATININE: 1.86 mg/dL — ABNORMAL HIGH
EGFR CKD-EPI (2021) FEMALE: 31 mL/min/{1.73_m2} — ABNORMAL LOW (ref >=60)
GLUCOSE RANDOM: 124 mg/dL (ref 70–179)
PHOSPHORUS: 4.8 mg/dL (ref 2.4–5.1)
POTASSIUM: 4.3 mmol/L (ref 3.4–4.8)
SODIUM: 141 mmol/L (ref 135–145)

## 2022-12-15 LAB — LACTATE, VENOUS, WHOLE BLOOD: LACTATE BLOOD VENOUS: 2.7 mmol/L — ABNORMAL HIGH (ref 0.5–1.8)

## 2022-12-15 LAB — MAGNESIUM: MAGNESIUM: 2.1 mg/dL (ref 1.6–2.6)

## 2022-12-15 LAB — IONIZED CALCIUM VENOUS: CALCIUM IONIZED VENOUS (MG/DL): 4.84 mg/dL (ref 4.40–5.40)

## 2022-12-15 LAB — PRO-BNP: PRO-BNP: 2927 pg/mL — ABNORMAL HIGH (ref <=300.0)

## 2022-12-15 MED ADMIN — iloprost (VENTAVIS) nebulizer solution: 5 ug | RESPIRATORY_TRACT | @ 19:00:00

## 2022-12-15 MED ADMIN — budesonide (PULMICORT) nebulizer solution 0.5 mg: .5 mg | RESPIRATORY_TRACT | @ 14:00:00

## 2022-12-15 MED ADMIN — magnesium oxide-Mg AA chelate (Magnesium Plus Protein) 2 tablet: 2 | ORAL | @ 13:00:00

## 2022-12-15 MED ADMIN — arformoterol (BROVANA) nebulizer solution 15 mcg/2 mL: 15 ug | RESPIRATORY_TRACT | @ 14:00:00

## 2022-12-15 MED ADMIN — magnesium oxide-Mg AA chelate (Magnesium Plus Protein) 2 tablet: 2 | ORAL | @ 01:00:00

## 2022-12-15 MED ADMIN — sildenafiL (pulm.hypertension) (REVATIO) tablet 40 mg: 40 mg | ORAL | @ 18:00:00

## 2022-12-15 MED ADMIN — ipratropium-albuterol (DUO-NEB) 0.5-2.5 mg/3 mL nebulizer solution 3 mL: 3 mL | RESPIRATORY_TRACT | @ 13:00:00

## 2022-12-15 MED ADMIN — melatonin tablet 3 mg: 3 mg | ORAL | @ 06:00:00

## 2022-12-15 MED ADMIN — insulin glargine (LANTUS) injection 12 Units: 12 [IU] | SUBCUTANEOUS | @ 01:00:00

## 2022-12-15 MED ADMIN — insulin lispro (HumaLOG) injection 3 Units: 3 [IU] | SUBCUTANEOUS | @ 20:00:00

## 2022-12-15 MED ADMIN — ipratropium-albuterol (DUO-NEB) 0.5-2.5 mg/3 mL nebulizer solution 3 mL: 3 mL | RESPIRATORY_TRACT | @ 19:00:00

## 2022-12-15 MED ADMIN — ipratropium-albuterol (DUO-NEB) 0.5-2.5 mg/3 mL nebulizer solution 3 mL: 3 mL | RESPIRATORY_TRACT | @ 01:00:00

## 2022-12-15 MED ADMIN — iloprost (VENTAVIS) nebulizer solution: 5 ug | RESPIRATORY_TRACT | @ 01:00:00

## 2022-12-15 MED ADMIN — iloprost (VENTAVIS) nebulizer solution: 5 ug | RESPIRATORY_TRACT | @ 14:00:00

## 2022-12-15 MED ADMIN — arformoterol (BROVANA) nebulizer solution 15 mcg/2 mL: 15 ug | RESPIRATORY_TRACT | @ 01:00:00

## 2022-12-15 MED ADMIN — gabapentin (NEURONTIN) capsule 300 mg: 300 mg | ORAL | @ 18:00:00

## 2022-12-15 MED ADMIN — budesonide (PULMICORT) nebulizer solution 0.5 mg: .5 mg | RESPIRATORY_TRACT | @ 01:00:00

## 2022-12-15 MED ADMIN — iloprost (VENTAVIS) nebulizer solution: 5 ug | RESPIRATORY_TRACT | @ 16:00:00

## 2022-12-15 MED ADMIN — posaconazole (NOXAFIL) delayed released tablet 200 mg: 200 mg | ORAL | @ 13:00:00

## 2022-12-15 MED ADMIN — sodium chloride 3 % NEBULIZER solution 4 mL: 4 mL | RESPIRATORY_TRACT | @ 01:00:00

## 2022-12-15 MED ADMIN — sildenafiL (pulm.hypertension) (REVATIO) tablet 40 mg: 40 mg | ORAL | @ 01:00:00

## 2022-12-15 MED ADMIN — iloprost (VENTAVIS) nebulizer solution: 5 ug | RESPIRATORY_TRACT | @ 22:00:00

## 2022-12-15 MED ADMIN — gabapentin (NEURONTIN) capsule 300 mg: 300 mg | ORAL | @ 13:00:00

## 2022-12-15 MED ADMIN — iloprost (VENTAVIS) nebulizer solution: 5 ug | RESPIRATORY_TRACT | @ 04:00:00

## 2022-12-15 MED ADMIN — azithromycin (ZITHROMAX) tablet 250 mg: 250 mg | ORAL | @ 13:00:00 | Stop: 2025-08-25

## 2022-12-15 MED ADMIN — sodium chloride 3 % NEBULIZER solution 4 mL: 4 mL | RESPIRATORY_TRACT | @ 13:00:00

## 2022-12-15 MED ADMIN — sildenafiL (pulm.hypertension) (REVATIO) tablet 40 mg: 40 mg | ORAL | @ 13:00:00

## 2022-12-15 MED ADMIN — gabapentin (NEURONTIN) capsule 300 mg: 300 mg | ORAL | @ 01:00:00

## 2022-12-15 MED ADMIN — enoxaparin (LOVENOX) syringe 40 mg: 40 mg | SUBCUTANEOUS | @ 01:00:00

## 2022-12-15 NOTE — Unmapped (Signed)
Pt is currently on 7L LBNC. Pt has received all nebulized treatment through shift without complication. There are no concerns at this time. Will continue to monitor.

## 2022-12-15 NOTE — Unmapped (Signed)
Assessment: Karen Barber is a 57 y.o. female who was admitted for Acute on chronic hypoxic respiratory failure (CMS-HCC)       Shift Events: Patient A&O x4, on 8L LBNC, HR 70-90s, and afebrile. SBP labile. Patient endorsed some dizziness midday with SBP in low 80s. PRN Tylenol given for knee pain x1. Adequate UO. No BM. Moderate PO intake. Rounds completed q2h. Patient free from injury or falls.    Around 1730, patient went for 6 Minute Walk Test and had a hypotensive episode (60s/30s) with dizziness and diaphoresis. Rapid called. See ARRT note for more details.        Current Hospital Length of Stay: 15 Days    INFUSIONS:      VITALS:  Vitals:    12/12/22 2322 12/13/22 0600   Weight: 84.6 kg (186 lb 8.2 oz) 86.4 kg (190 lb 7.6 oz)      Weight change:      Temp:  [36.3 ??C (97.3 ??F)-36.7 ??C (98.1 ??F)] 36.4 ??C (97.6 ??F)  Heart Rate:  [76-99] 80  SpO2 Pulse:  [76-98] 84  Resp:  [14-42] 17  BP: (63-128)/(39-95) 97/70  MAP (mmHg):  [44-102] 76  SpO2:  [87 %-100 %] 90 %     VENT/OXYGEN SETTINGS:       INS & OUTS:  Date 12/14/22 0701 - 12/15/22 0700   Shift 0701-1900 1901-0700 24 Hour Total   INTAKE   P.O. 150  150   IV Piggyback  250 250   Shift Total 150 250 400   OUTPUT   Urine 1040  1040   Emesis/NG output 0  0   Shift Total 1040  1040     Intake/Output Summary (Last 24 hours) at 12/14/2022 2012  Last data filed at 12/14/2022 1919  Gross per 24 hour   Intake 420 ml   Output 2040 ml   Net -1620 ml        ACCESS:  Patient Lines/Drains/Airways Status       Active Active Lines, Drains, & Airways       Name Placement date Placement time Site Days    External Urinary Device 12/02/22 With Suction 12/02/22  1900  -- 12    Peripheral IV 12/11/22 Left;Posterior Forearm 12/11/22  1400  Forearm  3                           See flowsheets/MAR for further information      Virgia Land, RN  December 14, 2022 8:12 PM        Problem: Comorbidity Management  Goal: Blood Pressure in Desired Range  Outcome: Not Progressing Problem: Adult Inpatient Plan of Care  Goal: Plan of Care Review  Outcome: Ongoing - Unchanged  Goal: Patient-Specific Goal (Individualized)  Outcome: Ongoing - Unchanged  Goal: Absence of Hospital-Acquired Illness or Injury  Outcome: Ongoing - Unchanged  Intervention: Identify and Manage Fall Risk  Recent Flowsheet Documentation  Taken 12/14/2022 0800 by Virgia Land, RN  Safety Interventions:   aspiration precautions   bleeding precautions   bed alarm   commode/urinal/bedpan at bedside   fall reduction program maintained   infection management   lighting adjusted for tasks/safety   low bed   nonskid shoes/slippers when out of bed   room near unit station   toileting scheduled  Intervention: Prevent Skin Injury  Recent Flowsheet Documentation  Taken 12/14/2022 1800 by Virgia Land, RN  Positioning for Skin:  Supine/Back  Taken 12/14/2022 1600 by Virgia Land, RN  Positioning for Skin: Sitting in Chair  Taken 12/14/2022 1400 by Virgia Land, RN  Positioning for Skin: Sitting in Chair  Taken 12/14/2022 1200 by Virgia Land, RN  Positioning for Skin: Sitting in Chair  Taken 12/14/2022 1000 by Virgia Land, RN  Positioning for Skin: Sitting in Chair  Taken 12/14/2022 0800 by Virgia Land, RN  Positioning for Skin: Sitting in Chair  Device Skin Pressure Protection:   absorbent pad utilized/changed   skin-to-device areas padded   skin-to-skin areas padded   tubing/devices free from skin contact  Skin Protection:   adhesive use limited   cleansing with dimethicone incontinence wipes   skin-to-device areas padded   skin-to-skin areas padded   tubing/devices free from skin contact  Intervention: Prevent Infection  Recent Flowsheet Documentation  Taken 12/14/2022 0800 by Virgia Land, RN  Infection Prevention:   equipment surfaces disinfected   personal protective equipment utilized   hand hygiene promoted   rest/sleep promoted   single patient room provided  Goal: Optimal Comfort and Wellbeing  Outcome: Ongoing - Unchanged  Goal: Readiness for Transition of Care  Outcome: Ongoing - Unchanged  Goal: Rounds/Family Conference  Outcome: Ongoing - Unchanged     Problem: Fall Injury Risk  Goal: Absence of Fall and Fall-Related Injury  Outcome: Ongoing - Unchanged  Intervention: Promote Injury-Free Environment  Recent Flowsheet Documentation  Taken 12/14/2022 0800 by Virgia Land, RN  Safety Interventions:   aspiration precautions   bleeding precautions   bed alarm   commode/urinal/bedpan at bedside   fall reduction program maintained   infection management   lighting adjusted for tasks/safety   low bed   nonskid shoes/slippers when out of bed   room near unit station   toileting scheduled     Problem: Gas Exchange Impaired  Goal: Optimal Gas Exchange  Outcome: Ongoing - Unchanged     Problem: Comorbidity Management  Goal: Blood Glucose Levels Within Targeted Range  Outcome: Ongoing - Unchanged

## 2022-12-15 NOTE — Unmapped (Signed)
Assessment: Karen Barber is a 57 y.o. female who was admitted for Acute on chronic hypoxic respiratory failure (CMS-HCC)       Shift Events: Pt is A&Ox4, NSR, SBP in 90s, MAP within parameters. O2 sats stable on 7-8 L LBNC. Pt desaturates [name] mid 80s with movement. Afebrile. No c/o pain. UO adequate via purewick, no BM this shift. Skin intact, Q2H turns declined and standard precautions maintained. No falls/injuries this shift. On SQ Lovenox for DVT prophylaxis. All monitors with appropriate alarm settings, call bell within reach, see flowsheets/MAR for further info.      Current Hospital Length of Stay: 16 Days    INFUSIONS:      VITALS:  Vitals:    12/12/22 2322 12/13/22 0600   Weight: 84.6 kg (186 lb 8.2 oz) 86.4 kg (190 lb 7.6 oz)      Weight change:      Temp:  [36.3 ??C (97.3 ??F)-36.5 ??C (97.7 ??F)] 36.3 ??C (97.4 ??F)  Heart Rate:  [76-99] 84  SpO2 Pulse:  [76-98] 83  Resp:  [14-40] 17  BP: (63-128)/(39-83) 97/64  MAP (mmHg):  [44-91] 72  SpO2:  [87 %-100 %] 99 %     VENT/OXYGEN SETTINGS:       INS & OUTS:  Date 12/14/22 0701 - 12/15/22 0700 12/15/22 0701 - 12/16/22 0700   Shift 0701-1900 1901-0700 24 Hour Total 0701-1900 1901-0700 24 Hour Total   INTAKE   P.O. 150 180 330      IV Piggyback  250 250      Shift Total 150 430 580      OUTPUT   Urine 1040 400 1440      Emesis/NG output 0  0      Shift Total 1040 400 1440        Intake/Output Summary (Last 24 hours) at 12/15/2022 0555  Last data filed at 12/15/2022 0400  Gross per 24 hour   Intake 590 ml   Output 1640 ml   Net -1050 ml        ACCESS:  Patient Lines/Drains/Airways Status       Active Active Lines, Drains, & Airways       Name Placement date Placement time Site Days    External Urinary Device 12/02/22 With Suction 12/02/22  1900  -- 12    Peripheral IV 12/11/22 Left;Posterior Forearm 12/11/22  1400  Forearm  3                    See flowsheets/MAR for further information      Anitra Lauth  Yashvi Jasinski, RN  December 15, 2022 5:55 AM    Problem: Adult Inpatient Plan of Care  Goal: Plan of Care Review  Outcome: Ongoing - Unchanged  Goal: Patient-Specific Goal (Individualized)  Outcome: Ongoing - Unchanged  Goal: Absence of Hospital-Acquired Illness or Injury  Outcome: Ongoing - Unchanged  Intervention: Identify and Manage Fall Risk  Recent Flowsheet Documentation  Taken 12/14/2022 2000 by Dymin Dingledine, Anitra Lauth, RN  Safety Interventions:   aspiration precautions   bleeding precautions   fall reduction program maintained   lighting adjusted for tasks/safety  Intervention: Prevent Skin Injury  Recent Flowsheet Documentation  Taken 12/15/2022 0400 by Anaelle Dunton, Anitra Lauth, RN  Positioning for Skin: Supine/Back  Taken 12/14/2022 2000 by Raychell Holcomb, Anitra Lauth, RN  Positioning for Skin: Supine/Back  Device Skin Pressure Protection: absorbent pad utilized/changed  Skin Protection:   incontinence pads utilized   adhesive use limited  Intervention: Prevent and Manage VTE (  Venous Thromboembolism) Risk  Recent Flowsheet Documentation  Taken 12/14/2022 2000 by Keywon Mestre, Anitra Lauth, RN  VTE Prevention/Management: anticoagulant therapy  Intervention: Prevent Infection  Recent Flowsheet Documentation  Taken 12/14/2022 2000 by Andrius Andrepont, Anitra Lauth, RN  Infection Prevention:   environmental surveillance performed   equipment surfaces disinfected   hand hygiene promoted  Goal: Optimal Comfort and Wellbeing  Outcome: Ongoing - Unchanged  Goal: Readiness for Transition of Care  Outcome: Ongoing - Unchanged  Goal: Rounds/Family Conference  Outcome: Ongoing - Unchanged     Problem: Fall Injury Risk  Goal: Absence of Fall and Fall-Related Injury  Outcome: Ongoing - Unchanged  Intervention: Promote Scientist, clinical (histocompatibility and immunogenetics) Documentation  Taken 12/14/2022 2000 by Shinika Estelle, Anitra Lauth, RN  Safety Interventions:   aspiration precautions   bleeding precautions   fall reduction program maintained   lighting adjusted for tasks/safety     Problem: Gas Exchange Impaired  Goal: Optimal Gas Exchange  Outcome: Ongoing - Unchanged  Intervention: Optimize Oxygenation and Ventilation  Recent Flowsheet Documentation  Taken 12/15/2022 0400 by Lue Sykora, Anitra Lauth, RN  Head of Bed [facility]) Positioning: HOB at 30-45 degrees  Taken 12/15/2022 0200 by Jaymes Revels, Anitra Lauth, RN  Head of Bed [facility]) Positioning: HOB at 30-45 degrees  Taken 12/15/2022 0000 by Tashonda Pinkus, Anitra Lauth, RN  Head of Bed [facility]) Positioning: HOB at 30-45 degrees  Taken 12/14/2022 2345 by Eyva Califano, Anitra Lauth, RN  Head of Bed [facility]) Positioning: HOB at 30-45 degrees  Taken 12/14/2022 2200 by Deryl Ports, Anitra Lauth, RN  Head of Bed [facility]) Positioning: HOB at 30-45 degrees  Taken 12/14/2022 2000 by Offie Pickron, Anitra Lauth, RN  Head of Bed [facility]) Positioning: HOB at 30-45 degrees  Taken 12/14/2022 1925 by Skylan Lara, Anitra Lauth, RN  Head of Bed [facility]) Positioning: HOB at 45 degrees     Problem: Comorbidity Management  Goal: Blood Glucose Levels Within Targeted Range  Outcome: Ongoing - Unchanged  Goal: Blood Pressure in Desired Range  Outcome: Ongoing - Unchanged

## 2022-12-15 NOTE — Unmapped (Signed)
Infectious Disease ([facility]) Progress Note    Assessment & Plan:   Karen Barber is a 57 y.o. female whose presentation is complicated by bronchiectasis type 1 in the setting of pulmonary sarcoidosis (6L [place] home O2), HTN, T2DM, COPD, s/p LUL lobectomy, and asthma that presented to [facility] admitted to MICU after becoming hypoxic after holding home meds prior to PET scan. After 1 week diuresing ICU stabilized patient she was transferred to the MPCU.    Principal Problem:    Acute on chronic hypoxic respiratory failure (CMS-HCC)  Active Problems:    Pulmonary sarcoidosis (CMS-HCC)    Type 2 diabetes mellitus with hyperglycemia (CMS-HCC)    COPD (chronic obstructive pulmonary disease) (CMS-HCC)    Chronic pulmonary aspergillosis (CMS-HCC)    Pulmonary hypertension (CMS-HCC)    AKI (acute kidney injury) (CMS-HCC)    Active Problems  #Acute on Chronic Hypoxemic Respiratory Failure -   #Pulmonary Sarcoidosis -   #COPD/asthma -   #Bronchiectasis  Patient has notable history of pulmonary sarcoidosis, COPD, pulmonary hypertension and is on baseline oxygen 6 L.  Recent admission with pneumonia growing Serratia marcescens  s/p 1x Rocephin (8/18), Vancomycin (8/18-8/19), Zosyn (8/18-8/23) discharged on levofloxacin 500 mg ending on 8/31.  Patient held her medications only for the morning prior to an outpatient PET CT scan during which she became hypoxic to the 70s and was admitted to the MICU.  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 She received one dose of 125mg  of IV methylprednisolone. She was weaned from high flow nasal cannula to large bore nasal cannula after IV diuresis and a course of antibiotics for possible pneumonia with Vanco/cefepime later transition to levofloxacin with a total 1 week treatment with antibiotics. Started on vanc/cefepime for broad coverage --> stopped vanc and cefepime 9/12- then levaquin (9/12---9/16).  Patient was transferred to the floor on 9/18 after showing significant signs of improvement but still having occasional desaturations with ambulation. PET scan 9/18 is not indicative of active pulmonary sarcoidosis.  Acute exacerbation likely multifactorial including in not taking her home medications as per directed for airway clearance, volume overload, and higher O2 requirement chronically than the amount she has been using. RHC 9/20 showing significant pulm HTN disease with mean PA pressure of 47, for which pulmonology team started Iloprost. Notably, wedge pressure on that study was 5, which is reassuring against volume as driver of her respiratory status at that time.Currently requiring 6-8 L [place] and at this point it is likely she has higher O2 requirement chronically than the amount she has been using at home. This may reflect her new baseline and this was discussed with the patient.   -- plan to get oxymizer for her while admitted. Will need oxymizer at home  -- pulmonology, appreciate recommendations  -- Continue home airway clearance (DuoNebs and HTS 3 times daily with [place])  -- Continue home Pulmicort, Brovana, Atrovent, as needed albuterol, and azithromycin  -- Holding Lasix PO 80 mg twice daily  -- PT and OT consult     #Pulmonary hypertension - RV failure - orthostatic hypotension  Patient has history of pulmonary hypertension due to pulmonary sarcoidosis/COPD.  TTE this admission 9/17 with severely dilated right atrium with severe pulmonary hypertension PASP 86 mmHg.  Estimated right atrial pressure 5 to 10 mmHg.  On Lasix 80 mg twice daily at home.  Echo showed LVEF greater than 55% and severe pulmonary hypertension no significant change since prior study.  Right heart cath showed severe pulmonary Hypertension, markedly  elevated PVR (9.3 - 10.5) and low cardiac output / index. HRCT scan shows unchanged fibrotic pulmonary sarcoidosis with bilateral segmental and subsegmental fibrosis post left upper lobectomy with unchanged apical right upper lobe and superior segment left lower lobe bullae.   -- Continue inhaled iloprost 5 mcg 6 times a day q4h; please do not mix with other nebulizer solutions   -- holding Lasix 80 mg BID, continue home Sildenafil 40mg  tid  -- trend pro BNP, strict I/Os, Daily weights  -- Plan to transition to Tyvaso nebs on discharge  -- [facility] rep to do teaching today prior to DC    #Acute kidney injury on CKD3 (downtrending)  On admission patient's creatinine 1.31 which appears to be baseline.  On 9/19 patient was found to have an AKI by definition of > 0.3 increase in creatinine in 48 hours.  -- Check lactate f/up   -- Renally dose medications  -- Daily BMP    #Secondary Hyperparathyroidism, Hypocalcemia, and Hyperphosphatemia  Findings are consistent with secondary hyperparathyroidism due to CKD.  Ionized calcium: 4.37 mg/dL (low)  Phosphate: 5.6 mg/dL (elevated)  PTH: 034.7 pg/mL (elevated)  Vitamin D Total (25OH) 35  -- Reconsider need for future oral calcium carbonate 1600 mg TID  -- Reconsider need for future phoslo 667 mg PO TID   -- Reassess PTH levels in 4-6 weeks.    #Type 2 diabetes mellitus with hyperglycemia and hypoglycemia  Hemoglobin A1c 8.3%.  Home regimen includes glargine 18 units nightly and sliding scale insulin.  Large fluctuations in glucose greater than 300 and occasional values ~50.  -- Sliding scale insulin insulin sensitivity factor 40  --12 glargine nightly  -- 3 units lispro tid     #Left leg swelling (resolved )  Patient endorses intermittent left > right leg swelling. Venous doppler unremarkable.      Chronic Problems     #Chronic Pain   - Continuing home gabapentin   - PT/OT consulted, appreciate recs     #Chronic pulmonary aspergillosis  Patient has history of aspergilloma and is on chronic posaconazole. Soluble IL-2 receptor, Aspergillus Galactomannan Antigen, IgE checked 10/2022 all wnl  -Continue home posaconazole Ppx  -Outpatient evaluation for [facility] scheduled for 10/25 to evaluate recurrent hemoptysis     #Iron deficiency anemia  #Chronic normocytic anemia (baseline hemoglobin 11)  Patient has some mild normocytic anemia with hemoglobin of 11. Iron panel shows iron of 37 with iron saturation of 15.   --Iron supplementation with iron dextran 1,000mg  in 250cc    #Hyperlipidemia  Last lipid profile showed an LDL of 146  -- Follow-up outpatient    Issues Impacting Complexity of Management:  -The patient is at high risk for the development of complications of volume overload due to the need to provide IV hydration for suspected hypovolemia in the setting of: Pulmonary hypertension  -Need for the following intensive monitoring parameter(s) due to high risk of clinical decline: continuous oxygen monitoring, telemetry, and frequent monitoring of urine output to assess for the efficacy of diuretic regimen  -Need for intensive oxygen therapy of HFNC, which places the patient at high risk for oxygen toxicity  -The patient is at high risk of complications from pulmonary hypertension    Daily Checklist:  Diet: Regular Diet   DVT PPx: Lovenox 40mg  q24h  Electrolytes: Replete Potassium to >/= 3.6 and Magnesium to >/= 1.8  Code Status: DNR and DNI  Dispo:  Continue MPCU    Team Contact Information:   Primary Team: Infectious  Disease ([facility])  Primary Resident: Conception Chancy, MD,  Resident's Pager: [phone] Evelene Croon Disease Intern - Tower)    Interval History:   No acute events overnight.    RRT called for hypotension during 6-minute walk test. Per RN, patient stood up out of bed, walked a few feet down the hallway and became hypotensive, measured at 63/50, MAP of 53. Patient reported dizziness and weakness, and required assistance to return to chair and then back to bed.Repeat blood pressures improved after returning to bed, 90s/50s. Patient's dizziness and lightheadedness also improved.     ROS negative except for where otherwise noted above.    Objective:   Temp:  [36.3 ??C (97.4 ??F)-36.5 ??C (97.7 ??F)] 36.3 ??C (97.4 ??F)  Heart Rate:  [76-99] 80  SpO2 Pulse:  [76-98] 86  Resp:  [14-40] 27  BP: (63-128)/(39-83) 117/71  FiO2 (%):  [50 %] 50 %  SpO2:  [87 %-100 %] 94 %,   Intake/Output Summary (Last 24 hours) at 12/15/2022 1106  Last data filed at 12/15/2022 0900  Gross per 24 hour   Intake 580 ml   Output 1150 ml   Net -570 ml   , FiO2 (%):  [50 %] 50 %  O2 Device: Large bore nasal cannula (cool high flow)  O2 Flow Rate (L/min):  [7 L/min-8 L/min] 7 L/min    Gen: NAD, converses, appears comfortable on large bore nasal cannula  HENT: atraumatic, normocephalic  Heart: RRR  Lungs: CTAB, no crackles or wheezes, reduced breath sounds LUL   Abdomen: soft, NTND  Extremities: no edema     Lan Mcneill Raelyn Mora, MD   [facility] Internal Medicine PGY-1

## 2022-12-15 NOTE — Unmapped (Signed)
Pulmonary Consult Service  Consult Follow Up Note      Primary Service:   Medicine  Primary Service Attending:  Ebony Hail, MD  Reason for Consult:   Assistance with diuresis    ASSESSMENT and PLAN     Karen Barber is a 57 y.o. 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 Pt was found to have acute on chronic respiratory failure (sat in 70s on home 6L) in the setting of not taking her home medications (sildenafil, lasix, nebs, inhalers) in preparation for PET scan, admitted 9/11. She was admitted to the ICU initially, has since been weaned back to 6-8L Central with airway clearance and diuresis.     Her acute on chronic hypoxemia and increase O2 requirement was likely multifactorial -- combination of volume overload as well as lack of airway clearance at home, however I think chronically her O2 requirement (related to her PH) is higher than the amount she has been on at home and she continues to have severe exertional hypoxemia.    Acute on Chronic Hypoxemic Respiratory Failure  COPD/Asthma  Pulmonary Sarcoidosis  Bronchiectasis s/s sarcoid  Soluble IL-2 receptor, Aspergillus Galactomannan Antigen, IgE checked 10/2022 all wnl. PET scan 9/17 does not appear to be active pulmonary sarcoidosis and again it is overall felt to be burned out from sarcoidosis standpoint. Patient says this is the best that she's felt since coming to the hospital. Unable to complete due to recurrent nosebleeds and required 15L via NRB. Currently requiring 7-8 L [place] with appropriate airway clearance. It is likely she has higher O2 requirement chronically than the amount she has been using at home. This may reflect her new baseline and this was discussed with the patient.   Plan:  - continue home pulmicort, brovana, PRN albuterol, azithromycin  - Home with 6L and oxymizer  - please cont airway clearance:   - duonebs TID and HTS (3%) BID with aerobika    RV Failure  PAH (Group 3, 5)  TTE 9/17 showed severe pulmonary HTN (PASP ), severely dilated RV with mildly reduced systolic function, moderately dilated RA. Likely multifactorial in the setting of known COPD, pulmonary sarcoidosis. Pro-BNP still elevated at 3,125. Since admission cumulatively -16.2 L and pt appears to be euvolemic on exam. RHC 9/20 demonstrated severe pulmonary hypertension, markedly elevated PVR (9.3-10.5) and low cardiac output/index. HRCT completed for PH-ILD diagnosis and Tyvaso enrollment sent to [facility]. 9/26 pt received her oxymizer. She attempted to perform but had a drop in BP to 63/50 after walking several feet and ultimately had a rapid response called. She was able to be assisted to her bed with recovery of BP to 90s/50s and sat of 93% on 7L. Received a 1x bolus of of LR.     Plan:  - Plan to transition to Tyvaso nebs on discharge (arranged with [facility] clinic RN). Approval is still pending  - [facility] rep to do teaching today  - Continue sildenafil 40 mg  - Continue inhaled iloprost 5 mcg q4h. Please do not mix with other nebulizer solutions  - Hold lasix 80 mg IV today. Rapid response likely due to orthostasis and volume depletion  - Continue daily weight with strict I/O  - cont daily pro-BNP    Chronic Pulmonary Aspergillosis  Aspergillus Galactomannan Antigen, IgE checked 10/2022 all wnl.   - continue home posaconazole   - pt has outpt evaluation for [facility] scheduled for 10/25 for history of recurrent hemoptysis    Karen Barber was seen, examined and discussed with  Dr. Nada Libman  Thank you for involving [place] in her care. We look forward to following with you.  Recommendations were communicated to the primary team at 10:14 AM  Please page [place] with any questions or concerns at [phone] (pulmonary consult fellow).    Armed forces training and education officer  Medical Student      HISTORY:     History of Present Illness:  Karen Barber is a 57 y.o. female with hx pf pulmonary sarcoidosis, COPD, PAH (baseline home O2 6L), chronic pulmonary aspergillosis, s/p LUL lobectomy 2016 who initially presented to the hospital for a PET scan to evaluate her sarcoidosis.     Pt was found to have acute on chronic respiratory failure (sat in 70s on home 6L) in the setting of not taking her home medications (sildenafil, lasix, nebs, inhalers) in preparation for PET scan. As her respiratory status did not improve with NRB, placed on HFNC. 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 She received one dose of 125mg  of IV methylprednisolone and then was transferred to the MICU for further management of her hypoxia. Pt had a week long stay in the MICU and was weaned from HFNC to [facility] after IV diuresis and course of antibiotics (completed course of levaquin) for possible pneumonia. Pt has been continuing home airway clearance (Aerobika and HTS neb), and home pulmicort, brovana, atrovent, and PRN albuterol. Showing significant signs of improvement but still having desaturations with ambulation.     Pt started iloprost 9/19 and has tolerated it well so far. RHC 9/20 demonstrated severe pulmonary hypertension, markedly elevated PVR (9.3-10.5) and low cardiac output/index. HRCT chest showed unchanged fibrotic pulmonary sarcoidosis with bilateral segmental and subsegmental fibrosis post left upper lobectomy with unchanged apical right upper lobe and superior segment left lower lobe bullae.     Of note, has been hospitalized twice prior to this in the past 6 weeks.  8/18-8/25: presented with increased O2 requirement, increased swelling, brown sputum production.   - treated for serratia pneumonia (completed levaquin)  - Was discharged on AVAPS for home, as well as with HTS/albuterol and [place].  10/21/22-10/25/22: presented with DOE, up to 8-10L with exertion and desats to 70%.   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 A.niger from sputum 08/2016-09/2016.    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    Sarcoidosis 1995    Type 2 diabetes mellitus with hyperglycemia (CMS-HCC) 07/27/2014    Visual impairment     glasses     Past Surgical History:   Procedure Laterality Date    LUNG LOBECTOMY      PR AMPUTATION TOE,MT-P JT Right 12/12/2019    Procedure: Right foot fifth toe amputation at the metatarsophalangeal joint level;  Surgeon: Britt Bottom, DPM;  Location: MAIN OR [facility];  Service: Vascular    PR AMPUTATION TOE,MT-P JT Right 06/16/2020    Procedure: Right foot amputation of 3rd toe at mpj level;  Surgeon: Britt Bottom, DPM;  Location: MAIN OR [facility];  Service: Vascular    PR RIGHT HEART CATH O2 SATURATION & CARDIAC OUTPUT N/A 04/29/2019    Procedure: Right Heart Catheterization;  Surgeon: Rosana Hoes, MD;  Location: [facility] CATH;  Service: Cardiology    PR RIGHT HEART CATH O2 SATURATION & CARDIAC OUTPUT N/A 12/08/2022    Procedure: Right Heart Catheterization;  Surgeon: Neal Dy, MD;  Location: [facility] CATH;  Service: Cardiology    PR THORACOSCOPY SURG TOT PULM DECORT Left 12/14/2014    Procedure: THORACOSCOPY SURG; W/TOT PULM DECORTIC/PNEUMOLYS;  Surgeon: Evert Kohl, MD;  Location: MAIN [facility];  Service: Cardiothoracic    PR THORACOSCOPY W/THERA WEDGE RESEXN INITIAL UNILAT Left 12/03/2014    Procedure: THORACOSCOPY, SURGICAL; WITH THERAPEUTIC WEDGE RESECTION (EG, MASS, NODULE) INITIAL UNILATERAL;  Surgeon: Evert Kohl, MD;  Location: MAIN OR [facility];  Service: Cardiothoracic       Other History:  Family History   Problem Relation Age of Onset    Dementia Mother     Diabetes Father     Diabetes Brother     Diabetes Maternal Aunt     Sarcoidosis Maternal Aunt     Diabetes Maternal Uncle     Diabetes Paternal Aunt     Diabetes Paternal Uncle      Social History     Socioeconomic History    Marital status: Single   Tobacco Use    Smoking status: Former     Current packs/day: 0.00     Average packs/day: 0.5 packs/day for 9.0 years (4.5 ttl pk-yrs)     Types: Cigarettes     Start date: 05/02/1978     Quit date: 05/03/1987     Years since quitting: 35.6    Smokeless tobacco: Never   Vaping Use    Vaping status: Never Used   Substance and Sexual Activity    Alcohol use: No     Alcohol/week: 0.0 standard drinks of alcohol    Drug use: No    Sexual activity: Yes   Other Topics Concern    Do you use sunscreen? No    Tanning bed use? No    Are you easily burned? No    Excessive sun exposure? No    Blistering sunburns? No     Social Determinants of Health     Financial Resource Strain: Low Risk  (11/06/2022)    Overall Financial Resource Strain (CARDIA)     Difficulty of Paying Living Expenses: Not hard at all   Food Insecurity: No Food Insecurity (11/06/2022)    Hunger Vital Sign     Worried About Running Out of Food in the Last Year: Never true     Ran Out of Food in the Last Year: Never true  Transportation Needs: No Transportation Needs (11/06/2022)    PRAPARE - Therapist, art (Medical): No     Lack of Transportation (Non-Medical): No       Home Medications:  No current facility-administered medications on file prior to encounter.     Current Outpatient Medications on File Prior to Encounter   Medication Sig Dispense Refill    ACCU-CHEK GUIDE GLUCOSE METER Misc 1 Device by Other route Four (4) times a day (before meals and nightly). AS DIRECTED 1 kit 0    albuterol 2.5 mg /3 mL (0.083 %) nebulizer solution Inhale 3 mL (2.5 mg total) by nebulization every six (6) hours as needed for wheezing or shortness of breath (airway clearance/cough). 360 mL 3    albuterol HFA 90 mcg/actuation inhaler Inhale 2 puffs every four (4) hours as needed for wheezing or shortness of breath. 54 g 0    arformoterol (BROVANA) 15 mcg/2 mL Inhale 2 mL (15 mcg total) by nebulization in the morning and 2 mL (15 mcg total) in the evening. 120 mL 2    aspirin-caffeine (BAYER BACK AND BODY) 500-32.5 mg Tab Take by mouth. Takes 2 1/2 pills 3-4 times a day as needed.      azithromycin (ZITHROMAX) 250 MG tablet Take 1 tablet (250 mg total) by mouth daily. Start on 9/1 after completing the levofloxacin 90 tablet 0    blood sugar diagnostic Strp by Other route Three (3) times a day. ACCU-CHEK Guide meter 300 strip 1    budesonide (PULMICORT) 0.5 mg/2 mL nebulizer solution Inhale 2 mL (0.5 mg total) by nebulization in the morning and 2 mL (0.5 mg total) in the evening. 120 mL 2    furosemide (LASIX) 80 MG tablet Take 1 tablet (80 mg total) by mouth two (2) times a day. Take a third dose as needed for increasing weight or increasing swelling. 60 tablet 0    gabapentin (NEURONTIN) 300 MG capsule Take 1 capsule (300 mg total) by mouth Three (3) times a day. 270 capsule 3    insulin aspart (NOVOLOG U-100 INSULIN ASPART) 100 unit/mL vial Inject 0.01-0.2 mL (1-20 Units total) under the skin Four (4) times a day (before meals and nightly). SLIDING SCALE 20 mL 1    insulin glargine (BASAGLAR KWIKPEN U-100 INSULIN) 100 unit/mL (3 mL) injection pen Inject 0.18 mL (18 Units total) under the skin nightly. 15 mL 0    ipratropium (ATROVENT) 0.02 % nebulizer solution Inhale 2.5 mL (500 mcg total) by nebulization four (4) times a day. 300 mL 0    posaconazole (NOXAFIL) 100 mg delayed released tablet Take 2 tablets by mouth once daily 60 tablet 0    sildenafiL, pulm.hypertension, (REVATIO) 20 mg tablet Take 1 tablet (20 mg total) by mouth Three (3) times a day. 90 tablet 0    OXYGEN-AIR DELIVERY SYSTEMS MISC Dose: 2-3 LPM         In-Hospital Medications:  Scheduled:   arformoterol  15 mcg Nebulization BID (RT)    azithromycin  250 mg Oral Daily    budesonide  0.5 mg Nebulization BID (RT)    enoxaparin (LOVENOX) injection  40 mg Subcutaneous Nightly    flu vacc [id number] 6mos up(PF)  0.5 mL Intramuscular During hospitalization    [Provider Hold] furosemide  80 mg Oral BID    gabapentin  300 mg Oral TID    iloprost  5 mcg Nebulization 6XD    insulin glargine  12 Units Subcutaneous  Nightly    insulin lispro  0-20 Units Subcutaneous ACHS    insulin lispro  3 Units Subcutaneous 3xd Meals    ipratropium-albuterol  3 mL Nebulization TID (RT)    magnesium oxide-Mg AA chelate  2 tablet Oral BID    polyethylene glycol  17 g Oral Daily    posaconazole  200 mg Oral Daily    sildenafiL (pulm.hypertension)  40 mg Oral TID    sodium chloride  4 mL Nebulization BID (RT)     Continuous Infusions:      PRN Medications:  acetaminophen, albuterol, calcium carbonate, dextrose in water, glucagon, melatonin, [EXPIRED] Implement **AND** [EXPIRED] Care order/instruction **AND** [EXPIRED] Vital signs **AND** Adult Oxygen therapy **AND** [EXPIRED] sodium chloride **AND** [EXPIRED] sodium chloride 0.9% **AND** [EXPIRED] diphenhydrAMINE **AND** [EXPIRED] famotidine **AND** methylPREDNISolone sodium succinate **AND** [EXPIRED] EPINEPHrine, ondansetron **OR** ondansetron, oxymetazoline, senna, sodium chloride    Allergies:  Allergies as of 11/29/2022    (No Known Allergies)         PHYSICAL EXAM:   BP 103/69 - Pulse 83  - Temp 36.5 ??C (97.7 ??F) (Oral)  - Resp 17  - Ht 180.3 cm (5' 11)  - Wt 86.4 kg (190 lb 7.6 oz)  - LMP  (LMP Unknown)  - SpO2 (!) 89%  - BMI 26.57 kg/m??   General: Alert, well-appearing, and in no distress.  Eyes: Anicteric sclera, conjunctiva clear.  ENT:   Nares normal, septum midline, mucosa normal, no drainage.  Cardiovascular: Regular rate and rhythm.  Pulmonary: Diminished lung sounds in bilateral upper lobe, crackles bilateral bases, faint wheezing in bases today  Musculoskeletal: No clubbing.  Skin: No rashes or lesions., no LE edema  Neuro: No focal neurological deficits.    LABORATORY and RADIOLOGY DATA:     Pertinent Laboratory Data:  Lab Results   Component Value Date    WBC 4.2 12/15/2022    HGB 11.4 12/15/2022    HCT 34.8 12/15/2022    PLT 171 12/15/2022       Lab Results   Component Value Date    NA 141 12/15/2022    K 4.3 12/15/2022    CL 100 12/15/2022    CO2 35.0 (H) 12/15/2022    BUN 41 (H) 12/15/2022    CREATININE 1.86 (H) 12/15/2022    GLU 124 12/15/2022    CALCIUM 10.0 12/15/2022    MG 2.1 12/14/2022    PHOS 4.8 12/15/2022       Lab Results   Component Value Date    BILITOT 0.7 12/14/2022    BILIDIR <0.10 05/30/2019    PROT 6.9 12/14/2022    ALBUMIN 3.6 12/15/2022    ALT 11 12/14/2022    AST 15 12/14/2022    ALKPHOS 70 12/14/2022    GGT 60 (H) 05/31/2011       Lab Results   Component Value Date    INR 1.02 12/11/2019    APTT 30.9 12/11/2019       Pertinent Micro Data:  Microbiology Results (last day)       ** No results found for the last 24 hours. **             Pertinent Imaging Data:  9/11 CXR   - Extensive fibrotic changes and biapical bulla appear grossly unchanged. Extensive pulmonary fibrosis limits evaluation for superimposed infection.     9/17 Echo    Summary    1. Limited study.    2. The left ventricle is normal in size with  normal wall thickness.    3. The left ventricular systolic function is normal, LVEF is visually  estimated at > 55%.    4. The right ventricle is severely dilated in size, with mildly reduced  systolic function.    5. There is severe pulmonary hypertension.    6. TR maximum velocity: 4.4 m/s  Estimated PASP: 86 mmHg.    7. IVC size and inspiratory change suggest mildly elevated right atrial  pressure. (5-10 mmHg).    8. No significant change since prior study from 10/23/2022    9/18 PET CT  Impression   Bulky, partially calcified lymph nodes in the mediastinum and bilateral hilar with no significant to mild uptake, decreased in intensity since prior.   Grossly unchanged metabolically active right supraclavicular lymph nodes, accounting for differences in positioning of the arms and lack of IV contrast.   Bronchiectasis and partially metabolically active reticular opacities are seen bilaterally, most prominent in the lower lobes, overall similar to prior.   Dilated main pulmonary artery measuring up to 4.0 cm, suggestive of pulmonary arterial hypertension.     9/20 RHC  FINAL CARDIAC CATHETERIZATION REPORT    CONCLUSIONS:  - Severe pulmonary Hypertension  - Markedly elevated PVR (9.3 - 10.5)  - Low cardiac output / index     9/25 CXR  No significant interval change since the prior chest radiograph.     9/25 CT Chest High Resolution w/o contrast  Unchanged fibrotic pulmonary sarcoidosis with bilateral segmental and subsegmental fibrosis post left upper lobectomy with unchanged apical right upper lobe and superior segment left lower lobe bullae.

## 2022-12-16 LAB — RENAL FUNCTION PANEL
ALBUMIN: 3.5 g/dL (ref 3.4–5.0)
ANION GAP: 2 mmol/L — ABNORMAL LOW (ref 5–14)
BLOOD UREA NITROGEN: 38 mg/dL — ABNORMAL HIGH (ref 9–23)
BUN / CREAT RATIO: 23
CALCIUM: 9.2 mg/dL (ref 8.7–10.4)
CHLORIDE: 103 mmol/L (ref 98–107)
CO2: 35 mmol/L — ABNORMAL HIGH (ref 20.0–31.0)
CREATININE: 1.67 mg/dL — ABNORMAL HIGH
EGFR CKD-EPI (2021) FEMALE: 35 mL/min/{1.73_m2} — ABNORMAL LOW (ref >=60)
GLUCOSE RANDOM: 137 mg/dL (ref 70–179)
PHOSPHORUS: 5.4 mg/dL — ABNORMAL HIGH (ref 2.4–5.1)
POTASSIUM: 4.3 mmol/L (ref 3.4–4.8)
SODIUM: 140 mmol/L (ref 135–145)

## 2022-12-16 LAB — PRO-BNP: PRO-BNP: 2931 pg/mL — ABNORMAL HIGH (ref <=300.0)

## 2022-12-16 LAB — CBC
HEMATOCRIT: 33.2 % — ABNORMAL LOW (ref 34.0–44.0)
HEMOGLOBIN: 10.6 g/dL — ABNORMAL LOW (ref 11.3–14.9)
MEAN CORPUSCULAR HEMOGLOBIN CONC: 31.9 g/dL — ABNORMAL LOW (ref 32.0–36.0)
MEAN CORPUSCULAR HEMOGLOBIN: 26.8 pg (ref 25.9–32.4)
MEAN CORPUSCULAR VOLUME: 83.9 fL (ref 77.6–95.7)
MEAN PLATELET VOLUME: 9 fL (ref 6.8–10.7)
PLATELET COUNT: 168 10*9/L (ref 150–450)
RED BLOOD CELL COUNT: 3.95 10*12/L (ref 3.95–5.13)
RED CELL DISTRIBUTION WIDTH: 18 % — ABNORMAL HIGH (ref 12.2–15.2)
WBC ADJUSTED: 4 10*9/L (ref 3.6–11.2)

## 2022-12-16 LAB — BLOOD GAS CRITICAL CARE PANEL, VENOUS
BASE EXCESS VENOUS: 8.9 — ABNORMAL HIGH (ref -2.0–2.0)
BASE EXCESS VENOUS: 6.7 — ABNORMAL HIGH (ref -2.0–2.0)
CALCIUM IONIZED VENOUS (MG/DL): 4.86 mg/dL (ref 4.40–5.40)
CALCIUM IONIZED VENOUS (MG/DL): 4.61 mg/dL (ref 4.40–5.40)
GLUCOSE WHOLE BLOOD: 235 mg/dL — ABNORMAL HIGH (ref 70–179)
GLUCOSE WHOLE BLOOD: 194 mg/dL — ABNORMAL HIGH (ref 70–179)
HCO3 VENOUS: 35 mmol/L — ABNORMAL HIGH (ref 22–27)
HCO3 VENOUS: 31 mmol/L — ABNORMAL HIGH (ref 22–27)
HEMOGLOBIN BLOOD GAS: 11.3 g/dL — ABNORMAL LOW
HEMOGLOBIN BLOOD GAS: 10.5 g/dL — ABNORMAL LOW
LACTATE BLOOD VENOUS: 2.1 mmol/L — ABNORMAL HIGH (ref 0.5–1.8)
LACTATE BLOOD VENOUS: 2.5 mmol/L — ABNORMAL HIGH (ref 0.5–1.8)
O2 SATURATION VENOUS: 32.8 % — ABNORMAL LOW (ref 40.0–85.0)
O2 SATURATION VENOUS: 81.9 % (ref 40.0–85.0)
PCO2 VENOUS: 64 mmHg — ABNORMAL HIGH (ref 40–60)
PCO2 VENOUS: 51 mmHg (ref 40–60)
PH VENOUS: 7.35 (ref 7.32–7.43)
PH VENOUS: 7.41 (ref 7.32–7.43)
PO2 VENOUS: 26 mmHg — ABNORMAL LOW (ref 30–55)
PO2 VENOUS: 46 mmHg (ref 30–55)
POTASSIUM WHOLE BLOOD: 4.6 mmol/L (ref 3.4–4.6)
POTASSIUM WHOLE BLOOD: 4.3 mmol/L (ref 3.4–4.6)
SODIUM WHOLE BLOOD: 143 mmol/L (ref 135–145)
SODIUM WHOLE BLOOD: 141 mmol/L (ref 135–145)

## 2022-12-16 LAB — IONIZED CALCIUM VENOUS: CALCIUM IONIZED VENOUS (MG/DL): 4.88 mg/dL (ref 4.40–5.40)

## 2022-12-16 LAB — LACTATE, VENOUS, WHOLE BLOOD: LACTATE BLOOD VENOUS: 1.4 mmol/L (ref 0.5–1.8)

## 2022-12-16 MED ADMIN — insulin glargine (LANTUS) injection 12 Units: 12 [IU] | SUBCUTANEOUS | @ 01:00:00

## 2022-12-16 MED ADMIN — budesonide (PULMICORT) nebulizer solution 0.5 mg: .5 mg | RESPIRATORY_TRACT

## 2022-12-16 MED ADMIN — iloprost (VENTAVIS) nebulizer solution: 5 ug | RESPIRATORY_TRACT | @ 18:00:00

## 2022-12-16 MED ADMIN — acetaminophen (TYLENOL) tablet 650 mg: 650 mg | ORAL | @ 12:00:00

## 2022-12-16 MED ADMIN — iloprost (VENTAVIS) nebulizer solution: 5 ug | RESPIRATORY_TRACT | @ 16:00:00

## 2022-12-16 MED ADMIN — sildenafiL (pulm.hypertension) (REVATIO) tablet 40 mg: 40 mg | ORAL | @ 17:00:00

## 2022-12-16 MED ADMIN — insulin lispro (HumaLOG) injection 0-20 Units: 0-20 [IU] | SUBCUTANEOUS | @ 01:00:00

## 2022-12-16 MED ADMIN — gabapentin (NEURONTIN) capsule 300 mg: 300 mg | ORAL | @ 17:00:00

## 2022-12-16 MED ADMIN — iloprost (VENTAVIS) nebulizer solution: 5 ug | RESPIRATORY_TRACT | @ 01:00:00

## 2022-12-16 MED ADMIN — magnesium oxide-Mg AA chelate (Magnesium Plus Protein) 2 tablet: 2 | ORAL | @ 13:00:00

## 2022-12-16 MED ADMIN — azithromycin (ZITHROMAX) tablet 250 mg: 250 mg | ORAL | @ 13:00:00 | Stop: 2025-08-25

## 2022-12-16 MED ADMIN — sildenafiL (pulm.hypertension) (REVATIO) tablet 40 mg: 40 mg | ORAL | @ 13:00:00

## 2022-12-16 MED ADMIN — sodium chloride 3 % NEBULIZER solution 4 mL: 4 mL | RESPIRATORY_TRACT | @ 12:00:00

## 2022-12-16 MED ADMIN — insulin lispro (HumaLOG) injection 3 Units: 3 [IU] | SUBCUTANEOUS | @ 17:00:00

## 2022-12-16 MED ADMIN — iloprost (VENTAVIS) nebulizer solution: 5 ug | RESPIRATORY_TRACT | @ 22:00:00

## 2022-12-16 MED ADMIN — sildenafiL (pulm.hypertension) (REVATIO) tablet 40 mg: 40 mg | ORAL | @ 01:00:00

## 2022-12-16 MED ADMIN — ipratropium-albuterol (DUO-NEB) 0.5-2.5 mg/3 mL nebulizer solution 3 mL: 3 mL | RESPIRATORY_TRACT | @ 18:00:00

## 2022-12-16 MED ADMIN — ipratropium-albuterol (DUO-NEB) 0.5-2.5 mg/3 mL nebulizer solution 3 mL: 3 mL | RESPIRATORY_TRACT | @ 12:00:00

## 2022-12-16 MED ADMIN — furosemide (LASIX) injection 40 mg: 40 mg | INTRAVENOUS | @ 17:00:00 | Stop: 2022-12-16

## 2022-12-16 MED ADMIN — magnesium oxide-Mg AA chelate (Magnesium Plus Protein) 2 tablet: 2 | ORAL | @ 01:00:00

## 2022-12-16 MED ADMIN — arformoterol (BROVANA) nebulizer solution 15 mcg/2 mL: 15 ug | RESPIRATORY_TRACT | @ 12:00:00

## 2022-12-16 MED ADMIN — ipratropium-albuterol (DUO-NEB) 0.5-2.5 mg/3 mL nebulizer solution 3 mL: 3 mL | RESPIRATORY_TRACT

## 2022-12-16 MED ADMIN — iloprost (VENTAVIS) nebulizer solution: 5 ug | RESPIRATORY_TRACT | @ 12:00:00

## 2022-12-16 MED ADMIN — sodium chloride 3 % NEBULIZER solution 4 mL: 4 mL | RESPIRATORY_TRACT

## 2022-12-16 MED ADMIN — enoxaparin (LOVENOX) syringe 40 mg: 40 mg | SUBCUTANEOUS | @ 01:00:00

## 2022-12-16 MED ADMIN — insulin lispro (HumaLOG) injection 3 Units: 3 [IU] | SUBCUTANEOUS | @ 01:00:00

## 2022-12-16 MED ADMIN — budesonide (PULMICORT) nebulizer solution 0.5 mg: .5 mg | RESPIRATORY_TRACT | @ 12:00:00

## 2022-12-16 MED ADMIN — gabapentin (NEURONTIN) capsule 300 mg: 300 mg | ORAL | @ 13:00:00

## 2022-12-16 MED ADMIN — iloprost (VENTAVIS) nebulizer solution: 5 ug | RESPIRATORY_TRACT | @ 04:00:00

## 2022-12-16 MED ADMIN — arformoterol (BROVANA) nebulizer solution 15 mcg/2 mL: 15 ug | RESPIRATORY_TRACT

## 2022-12-16 MED ADMIN — gabapentin (NEURONTIN) capsule 300 mg: 300 mg | ORAL | @ 01:00:00

## 2022-12-16 MED ADMIN — posaconazole (NOXAFIL) delayed released tablet 200 mg: 200 mg | ORAL | @ 13:00:00

## 2022-12-16 NOTE — Unmapped (Signed)
Pulmonary Consult Service  Consult Follow Up Note      Primary Service:   Medicine  Primary Service Attending:  Ebony Hail, MD  Reason for Consult:   Assistance with diuresis    ASSESSMENT and PLAN     Karen Barber is a 57 y.o. 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 Pt was found to have acute on chronic respiratory failure (sat in 70s on home 6L) in the setting of not taking her home medications (sildenafil, lasix, nebs, inhalers) in preparation for PET scan, admitted 9/11. She was admitted to the ICU initially, has since been weaned back to 6-8L [place] with airway clearance and diuresis.     Her acute on chronic hypoxemia and increase O2 requirement was likely multifactorial -- combination of volume overload as well as lack of airway clearance at home, however I think chronically her O2 requirement (related to her PH) is higher than the amount she has been on at home and she continues to have severe exertional hypoxemia. More recently (9/28) she has had increased O2 requirements in the setting of holding diuretics, recommend restarting as below.    RV Failure  PAH (Group 3, 5)  TTE 9/17 showed severe pulmonary HTN (PASP ), severely dilated RV with mildly reduced systolic function, moderately dilated RA. Likely multifactorial in the setting of known COPD, pulmonary sarcoidosis. Pro-BNP still elevated at 3,125. Since admission cumulatively -16.2 L and pt appears to be euvolemic on exam. RHC 9/20 demonstrated severe pulmonary hypertension, markedly elevated PVR (9.3-10.5) and low cardiac output/index. HRCT completed for PH-ILD diagnosis and Tyvaso enrollment sent to [facility]. 9/26 pt received her oxymizer. She attempted to perform but had a drop in BP to 63/50 after walking several feet and ultimately had a rapid response called. She was able to be assisted to her bed with recovery of BP to 90s/50s and sat of 93% on 7L. Received a 1x bolus of of LR.     Plan:  - restart diuretic today, 40mg  IV lasix trial for worsening hypoxemia   - assess response, will likely need to be put back on standing diuretic (80mg  PO lasix) at least  - Continue sildenafil 40 mg  - Continue inhaled iloprost 5 mcg q4h. Please do not mix with other nebulizer solutions  - Plan to transition to Tyvaso nebs on discharge (arranged with [facility] clinic RN). Approval is still pending  - Continue daily weight with strict I/O  - cont daily pro-BNP    Acute on Chronic Hypoxemic Respiratory Failure  COPD/Asthma  Pulmonary Sarcoidosis  Bronchiectasis s/s sarcoid  Soluble IL-2 receptor, Aspergillus Galactomannan Antigen, IgE checked 10/2022 all wnl. PET scan 9/17 does not appear to be active pulmonary sarcoidosis and again it is overall felt to be burned out from sarcoidosis standpoint. Patient says this is the best that she's felt since coming to the hospital. Unable to complete due to recurrent nosebleeds and required 15L via NRB. Currently requiring 7-8 L [place] with appropriate airway clearance. It is likely she has higher O2 requirement chronically than the amount she has been using at home. This may reflect her new baseline and this was discussed with the patient.   Plan:  - continue home pulmicort, brovana, PRN albuterol, azithromycin  - please cont  airway clearance:   - duonebs TID and HTS (3%) BID with aerobika    Chronic Pulmonary Aspergillosis   Aspergillus Galactomannan Antigen, IgE checked 10/2022 all wnl.   - continue home posaconazole   - pt has outpt evaluation for [facility] scheduled for 10/25 for history of recurrent hemoptysis    Ms. Avakian was seen, examined and discussed with  Dr. Nada Libman  Thank you for involving [place] in her care. We look forward to following with you.  Recommendations were communicated to the primary team at 10:14 AM  Please page [place] with any questions or concerns at [phone] (pulmonary consult fellow).    Alesia Banda, MD  Pulmonary Critical Care Fellow     HISTORY:     Interval Hx:   - increasing O2 requirements, now on HFNC (40L, 60%) satting 100%  - repeat CXR with slighty increase opacities    History of Present Illness:  Karen Barber is a 57 y.o. female with hx pf pulmonary sarcoidosis, COPD, PAH (baseline home O2 6L), chronic pulmonary aspergillosis, s/p LUL lobectomy 2016 who initially presented to the hospital for a PET scan to evaluate her sarcoidosis.     Pt was found to have acute on chronic respiratory failure (sat in 70s on home 6L) in the setting of not taking her home medications (sildenafil, lasix, nebs, inhalers) in preparation for PET scan. As her respiratory status did not improve with NRB, placed on HFNC. 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 She received one dose of 125mg  of IV methylprednisolone and then was transferred to the MICU for further management of her hypoxia. Pt had a week long stay in the MICU and was weaned from HFNC to [facility] after IV diuresis and course of antibiotics (completed course of levaquin) for possible pneumonia. Pt has been continuing home airway clearance (Aerobika and HTS neb), and home pulmicort, brovana, atrovent, and PRN albuterol. Showing significant signs of improvement but still having desaturations with ambulation.     Pt started iloprost 9/19 and has tolerated it well so far. RHC 9/20 demonstrated severe pulmonary hypertension, markedly elevated PVR (9.3-10.5) and low cardiac output/index. HRCT chest showed unchanged fibrotic pulmonary sarcoidosis with bilateral segmental and subsegmental fibrosis post left upper lobectomy with unchanged apical right upper lobe and superior segment left lower lobe bullae.     Of note, has been hospitalized twice prior to this in the past 6 weeks.  8/18-8/25: presented with increased O2 requirement, increased swelling, brown sputum production.   - treated for serratia pneumonia (completed levaquin)  - Was discharged on AVAPS for home, as well as with HTS/albuterol and [place].  10/21/22-10/25/22: presented with DOE, up to 8-10L with exertion and desats to 70%.   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 A.niger from sputum 08/2016-09/2016.    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    Sarcoidosis 1995    Type 2 diabetes mellitus with hyperglycemia (CMS-HCC) 07/27/2014    Visual impairment     glasses     Past Surgical History:   Procedure Laterality Date    LUNG LOBECTOMY      PR AMPUTATION TOE,MT-P JT Right 12/12/2019    Procedure: Right foot fifth toe amputation at the metatarsophalangeal joint level;  Surgeon: Britt Bottom, DPM;  Location: MAIN OR [facility];  Service: Vascular    PR AMPUTATION TOE,MT-P JT Right 06/16/2020    Procedure: Right foot amputation of 3rd toe at mpj level;  Surgeon: Britt Bottom, DPM;  Location: MAIN OR [facility];  Service: Vascular    PR RIGHT HEART CATH O2 SATURATION & CARDIAC OUTPUT N/A 04/29/2019    Procedure: Right Heart Catheterization;  Surgeon: Rosana Hoes, MD;  Location: [facility] CATH;  Service: Cardiology    PR RIGHT HEART CATH O2 SATURATION & CARDIAC OUTPUT N/A 12/08/2022    Procedure: Right Heart Catheterization;  Surgeon: Neal Dy, MD;  Location: [facility] CATH;  Service: Cardiology    PR THORACOSCOPY SURG TOT PULM DECORT Left 12/14/2014    Procedure: THORACOSCOPY SURG; W/TOT PULM DECORTIC/PNEUMOLYS;  Surgeon: Evert Kohl, MD;  Location: MAIN OR [facility];  Service: Cardiothoracic    PR THORACOSCOPY W/THERA WEDGE RESEXN INITIAL UNILAT Left 12/03/2014    Procedure: THORACOSCOPY, SURGICAL; WITH THERAPEUTIC WEDGE RESECTION (EG, MASS, NODULE) INITIAL UNILATERAL;  Surgeon: Evert Kohl, MD;  Location: MAIN OR [facility];  Service: Cardiothoracic       Other History:  Family History   Problem Relation Age of Onset    Dementia Mother     Diabetes Father     Diabetes Brother     Diabetes Maternal Aunt     Sarcoidosis Maternal Aunt     Diabetes Maternal Uncle     Diabetes Paternal Aunt     Diabetes Paternal Uncle      Social History     Socioeconomic History    Marital status: Single   Tobacco Use    Smoking status: Former     Current packs/day: 0.00     Average packs/day: 0.5 packs/day for 9.0 years (4.5 ttl pk-yrs)     Types: Cigarettes     Start date: 05/02/1978     Quit date: 05/03/1987     Years since quitting: 35.6    Smokeless tobacco: Never   Vaping Use    Vaping status: Never Used   Substance and Sexual Activity    Alcohol use: No     Alcohol/week: 0.0 standard drinks of alcohol    Drug use: No    Sexual activity: Yes   Other Topics Concern    Do you use sunscreen? No    Tanning bed use? No    Are you easily burned? No    Excessive sun exposure? No    Blistering sunburns? No     Social Determinants of Health     Financial Resource Strain: Low Risk  (11/06/2022)    Overall Financial Resource Strain (CARDIA)     Difficulty of Paying Living Expenses: Not hard at all   Food Insecurity: No Food Insecurity (11/06/2022)    Hunger Vital Sign     Worried About Running Out of Food  in the Last Year: Never true     Ran Out of Food in the Last Year: Never true   Transportation Needs: No Transportation Needs (11/06/2022)    PRAPARE - Therapist, art (Medical): No     Lack of Transportation (Non-Medical): No       Home Medications:  No current facility-administered medications on file prior to encounter.     Current Outpatient Medications on File Prior to Encounter   Medication Sig Dispense Refill    ACCU-CHEK GUIDE GLUCOSE METER Misc 1 Device by Other route Four (4) times a day (before meals and nightly). AS DIRECTED 1 kit 0    albuterol 2.5 mg /3 mL (0.083 %) nebulizer solution Inhale 3 mL (2.5 mg total) by nebulization every six (6) hours as needed for wheezing or shortness of breath (airway clearance/cough). 360 mL 3    albuterol HFA 90 mcg/actuation inhaler Inhale 2 puffs every four (4) hours as needed for wheezing or shortness of breath. 54 g 0    arformoterol (BROVANA) 15 mcg/2 mL Inhale 2 mL (15 mcg total) by nebulization in the morning and 2 mL (15 mcg total) in the evening. 120 mL 2    aspirin-caffeine (BAYER BACK AND BODY) 500-32.5 mg Tab Take by mouth. Takes 2 1/2 pills 3-4 times a day as needed.      azithromycin (ZITHROMAX) 250 MG tablet Take 1 tablet (250 mg total) by mouth daily. Start on 9/1 after completing the levofloxacin 90 tablet 0    blood sugar diagnostic Strp by Other route Three (3) times a day. ACCU-CHEK Guide meter 300 strip 1    budesonide (PULMICORT) 0.5 mg/2 mL nebulizer solution Inhale 2 mL (0.5 mg total) by nebulization in the morning and 2 mL (0.5 mg total) in the evening. 120 mL 2    furosemide (LASIX) 80 MG tablet Take 1 tablet (80 mg total) by mouth two (2) times a day. Take a third dose as needed for increasing weight or increasing swelling. 60 tablet 0    gabapentin (NEURONTIN) 300 MG capsule Take 1 capsule (300 mg total) by mouth Three (3) times a day. 270 capsule 3    insulin aspart (NOVOLOG U-100 INSULIN ASPART) 100 unit/mL vial Inject 0.01-0.2 mL (1-20 Units total) under the skin Four (4) times a day (before meals and nightly). SLIDING SCALE 20 mL 1    insulin glargine (BASAGLAR KWIKPEN U-100 INSULIN) 100 unit/mL (3 mL) injection pen Inject 0.18 mL (18 Units total) under the skin nightly. 15 mL 0    ipratropium (ATROVENT) 0.02 % nebulizer solution Inhale 2.5 mL (500 mcg total) by nebulization four (4) times a day. 300 mL 0    posaconazole (NOXAFIL) 100 mg delayed released tablet Take 2 tablets by mouth once daily 60 tablet 0    sildenafiL, pulm.hypertension, (REVATIO) 20 mg tablet Take 1 tablet (20 mg total) by mouth Three (3) times a day. 90 tablet 0    OXYGEN-AIR DELIVERY SYSTEMS MISC Dose: 2-3 LPM         In-Hospital Medications:  Scheduled:   arformoterol  15 mcg Nebulization BID (RT)    azithromycin  250 mg Oral Daily    budesonide  0.5 mg Nebulization BID (RT)    enoxaparin (LOVENOX) injection  40 mg Subcutaneous Nightly    flu vacc [id number] 6mos up(PF)  0.5 mL Intramuscular During hospitalization    furosemide  40 mg Intravenous Once    [Provider Hold] furosemide  80 mg Oral BID    gabapentin  300 mg Oral TID    iloprost  5 mcg Nebulization 6XD    insulin glargine  12 Units Subcutaneous Nightly    insulin lispro  0-20 Units Subcutaneous ACHS    insulin lispro  3 Units Subcutaneous 3xd Meals    ipratropium-albuterol  3 mL Nebulization TID (RT)    magnesium oxide-Mg AA chelate  2 tablet Oral BID    polyethylene glycol  17 g Oral Daily    posaconazole  200 mg Oral Daily    sildenafiL (pulm.hypertension)  40 mg Oral TID    sodium chloride  4 mL Nebulization BID (RT)         PRN Medications:  acetaminophen, albuterol, calcium carbonate, dextrose in water, glucagon, melatonin, [EXPIRED] Implement **AND** [EXPIRED] Care order/instruction **AND** [EXPIRED] Vital signs **AND** Adult Oxygen therapy **AND** [EXPIRED] sodium chloride **AND** [EXPIRED] sodium chloride 0.9% **AND** [EXPIRED] diphenhydrAMINE **AND** [EXPIRED] famotidine **AND** methylPREDNISolone sodium succinate **AND** [EXPIRED] EPINEPHrine, ondansetron **OR** ondansetron, oxymetazoline, senna, sodium chloride    Allergies:  Allergies as of 11/29/2022    (No Known Allergies)         PHYSICAL EXAM:   BP 98/63  - Pulse 90  - Temp 36.6 ??C (97.9 ??F) (Oral)  - Resp 24  - Ht 180.3 cm (5' 11)  - Wt 86.4 kg (190 lb 7.6 oz)  - LMP  (LMP Unknown)  - SpO2 95%  - BMI 26.57 kg/m??   General: Alert, well-appearing, and in no distress.  Eyes: Anicteric sclera, conjunctiva clear.  ENT:   no drainage.  Cardiovascular: Regular rate and rhythm.  Pulmonary: Diminished lung sounds in bilateral upper lobe, crackles bilateral bases  Musculoskeletal: No clubbing.  Skin: No rashes or lesions., trace to 1+ LE edema  Neuro: No focal neurological deficits.    LABORATORY and RADIOLOGY DATA:     Pertinent Laboratory Data:  Lab Results   Component Value Date    WBC 4.0 12/16/2022    HGB 10.6 (L) 12/16/2022    HCT 33.2 (L) 12/16/2022    PLT 168 12/16/2022       Lab Results   Component Value Date    NA 143 12/16/2022    K 4.6 12/16/2022    CL 103 12/16/2022    CO2 35.0 (H) 12/16/2022    BUN 38 (H) 12/16/2022    CREATININE 1.67 (H) 12/16/2022    GLU 137 12/16/2022    CALCIUM 9.2 12/16/2022    MG 2.1 12/14/2022    PHOS 5.4 (H) 12/16/2022       Lab Results   Component Value Date    BILITOT 0.7 12/14/2022    BILIDIR <0.10 05/30/2019    PROT 6.9 12/14/2022    ALBUMIN 3.5 12/16/2022    ALT 11 12/14/2022    AST 15 12/14/2022    ALKPHOS 70 12/14/2022    GGT 60 (H) 05/31/2011       Lab Results   Component Value Date    INR 1.02 12/11/2019    APTT 30.9 12/11/2019       Pertinent Micro Data:  Microbiology Results (last day)       ** No results found for the last 24 hours. **             Pertinent Imaging Data:  9/11 CXR   - Extensive fibrotic changes and biapical bulla appear grossly unchanged. Extensive pulmonary fibrosis limits evaluation for superimposed infection.     9/17 Echo  Summary    1. Limited study.    2. The left ventricle is normal in size with normal wall thickness.    3. The left ventricular systolic function is normal, LVEF is visually  estimated at > 55%.    4. The right ventricle is severely dilated in size, with mildly reduced  systolic function.    5. There is severe pulmonary hypertension.    6. TR maximum velocity: 4.4 m/s  Estimated PASP: 86 mmHg.    7. IVC size and inspiratory change suggest mildly elevated right atrial  pressure. (5-10 mmHg).    8. No significant change since prior study from 10/23/2022    9/18 PET CT  Impression   Bulky, partially calcified lymph nodes in the mediastinum and bilateral hilar with no significant to mild uptake, decreased in intensity since prior.   Grossly unchanged metabolically active right supraclavicular lymph nodes, accounting for differences in positioning of the arms and lack of IV contrast.   Bronchiectasis and partially metabolically active reticular opacities are seen bilaterally, most prominent in the lower lobes, overall similar to prior.   Dilated main pulmonary artery measuring up to 4.0 cm, suggestive of pulmonary arterial hypertension.     9/20 RHC  FINAL CARDIAC CATHETERIZATION REPORT    CONCLUSIONS:  - Severe pulmonary Hypertension  - Markedly elevated PVR (9.3 - 10.5)  - Low cardiac output / index     9/25 CXR  No significant interval change since the prior chest radiograph.     9/25 CT Chest High Resolution w/o contrast  Unchanged fibrotic pulmonary sarcoidosis with bilateral segmental and subsegmental fibrosis post left upper lobectomy with unchanged apical right upper lobe and superior segment left lower lobe bullae.     9/27 CXR  Fibrotic interstitial lung disease with biapical large bullae which are better characterized on the prior CT chest. No significant interval changes.

## 2022-12-16 NOTE — Unmapped (Signed)
Infectious Disease ([facility]) Progress Note    Assessment & Plan:   Karen Barber is a 57 y.o. female whose presentation is complicated by bronchiectasis type 1 in the setting of pulmonary sarcoidosis (6L [place] home O2), HTN, T2DM, COPD, s/p LUL lobectomy, and asthma that presented to [facility] admitted to MICU after becoming hypoxic after holding home meds prior to PET scan. After 1 week diuresing ICU stabilized patient she was transferred to the MPCU.    Principal Problem:    Acute on chronic hypoxic respiratory failure (CMS-HCC)  Active Problems:    Pulmonary sarcoidosis (CMS-HCC)    Type 2 diabetes mellitus with hyperglycemia (CMS-HCC)    COPD (chronic obstructive pulmonary disease) (CMS-HCC)    Chronic pulmonary aspergillosis (CMS-HCC)    Pulmonary hypertension (CMS-HCC)    AKI (acute kidney injury) (CMS-HCC)    Active Problems  #Acute on Chronic Hypoxemic Respiratory Failure -   #Pulmonary Sarcoidosis -   #COPD/asthma -   #Bronchiectasis  Patient has notable history of pulmonary sarcoidosis, COPD, pulmonary hypertension and is on baseline oxygen 6 L.  Recent admission with pneumonia growing Serratia marcescens  s/p 1x Rocephin (8/18), Vancomycin (8/18-8/19), Zosyn (8/18-8/23) discharged on levofloxacin 500 mg ending on 8/31.  Patient held her medications only for the morning prior to an outpatient PET CT scan during which she became hypoxic to the 70s and was admitted to the MICU.  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 She received one dose of 125mg  of IV methylprednisolone. She was weaned from high flow nasal cannula to large bore nasal cannula after IV diuresis and a course of antibiotics for possible pneumonia with Vanco/cefepime later transition to levofloxacin with a total 1 week treatment with antibiotics. Started on vanc/cefepime for broad coverage --> stopped vanc and cefepime 9/12- then levaquin (9/12---9/16).  Patient was transferred to the floor on 9/18 after showing significant signs of improvement but still having occasional desaturations with ambulation. PET scan 9/18 is not indicative of active pulmonary sarcoidosis.  Acute exacerbation likely multifactorial including in not taking her home medications as per directed for airway clearance, volume overload, and higher O2 requirement chronically than the amount she has been using. RHC 9/20 showing significant pulm HTN disease with mean PA pressure of 47, for which pulmonology team started Iloprost. Notably, wedge pressure on that study was 5, which is reassuring against volume as driver of her respiratory status at that time. Patient with increasing O2 requirements and now requiring HFNC after saturating to mid-80s even with 8L and Venturi mask. Possibly patient is volume overloaded from holding Lasix, however only +1 pitting edema noted on physical exam that has been stable. Will trial of IV Lasix 40 to assess response.    -- plan to get oxymizer for her while admitted. Will need oxymizer at home  -- pulmonology, appreciate recommendations  -- Continue home airway clearance (DuoNebs and HTS 3 times daily with [place])  -- Continue home Pulmicort, Brovana, Atrovent, as needed albuterol, and azithromycin  -- Trial of 1 time IV 40mg  Lasix due to worsening hypoxemia   -- Consider resuming Lasix PO 80 mg twice daily on 9/28   -- CXR unchanged from prior CT on 9/25  -- PT and OT consult     #Pulmonary hypertension - RV failure - orthostatic hypotension  Patient has history of pulmonary hypertension due to pulmonary sarcoidosis/COPD.  TTE this admission 9/17 with severely dilated right atrium with severe pulmonary hypertension PASP 86 mmHg.  Estimated right atrial pressure 5  to 10 mmHg.  On Lasix 80 mg twice daily at home.  Echo showed LVEF greater than 55% and severe pulmonary hypertension no significant change since prior study.  Right heart cath showed severe pulmonary Hypertension, markedly elevated PVR (9.3 - 10.5) and low cardiac output / index. HRCT scan shows unchanged fibrotic pulmonary sarcoidosis with bilateral segmental and subsegmental fibrosis post left upper lobectomy with unchanged apical right upper lobe and superior segment left lower lobe bullae.   -- Continue inhaled iloprost 5 mcg 6 times a day q4h; please do not mix with other nebulizer solutions   -- holding Lasix 80 mg BID, continue home Sildenafil 40mg  tid  -- trend pro BNP, strict I/Os, Daily weights  -- Plan to transition to Tyvaso nebs on discharge. Approval pending   -- [facility] rep to do teaching prior to DC    #Acute kidney injury on CKD3 (downtrending)  On admission patient's creatinine 1.31 which appears to be baseline.  On 9/19 patient was found to have an AKI by definition of > 0.3 increase in creatinine in 48 hours.  -- Check lactate f/up   -- Renally dose medications  -- Daily BMP    #Secondary Hyperparathyroidism, Hypocalcemia, and Hyperphosphatemia  Findings are consistent with secondary hyperparathyroidism due to CKD.  Ionized calcium: 4.37 mg/dL (low)  Phosphate: 5.6 mg/dL (elevated)  PTH: 161.0 pg/mL (elevated)  Vitamin D Total (25OH) 35  -- Reconsider need for future oral calcium carbonate 1600 mg TID  -- Reconsider need for future phoslo 667 mg PO TID   -- Reassess PTH levels in 4-6 weeks.    #Type 2 diabetes mellitus with hyperglycemia and hypoglycemia  Hemoglobin A1c 8.3%.  Home regimen includes glargine 18 units nightly and sliding scale insulin.  Large fluctuations in glucose greater than 300 and occasional values ~50.  -- Sliding scale insulin insulin sensitivity factor 40  --12 glargine nightly  -- 3 units lispro tid     #Left leg swelling (resolved )  Patient endorses intermittent left > right leg swelling. Venous doppler unremarkable.      Chronic Problems     #Chronic Pain   - Continuing home gabapentin   - PT/OT consulted, appreciate recs     #Chronic pulmonary aspergillosis  Patient has history of aspergilloma and is on chronic posaconazole. Soluble IL-2 receptor, Aspergillus Galactomannan Antigen, IgE checked 10/2022 all wnl  -Continue home posaconazole Ppx  -Outpatient evaluation for [facility] scheduled for 10/25 to evaluate recurrent hemoptysis     #Iron deficiency anemia  #Chronic normocytic anemia (baseline hemoglobin 11)  Patient has some mild normocytic anemia with hemoglobin of 11. Iron panel shows iron of 37 with iron saturation of 15.   --Iron supplementation with iron dextran 1,000mg  in 250cc    #Hyperlipidemia  Last lipid profile showed an LDL of 146  -- Follow-up outpatient    Issues Impacting Complexity of Management:  -The patient is at high risk for the development of complications of volume overload due to the need to provide IV hydration for suspected hypovolemia in the setting of: Pulmonary hypertension  -Need for the following intensive monitoring parameter(s) due to high risk of clinical decline: continuous oxygen monitoring, telemetry, and frequent monitoring of urine output to assess for the efficacy of diuretic regimen  -Need for intensive oxygen therapy of HFNC, which places the patient at high risk for oxygen toxicity  -The patient is at high risk of complications from pulmonary hypertension    Daily Checklist:  Diet: Regular Diet  DVT PPx: Lovenox 40mg  q24h  Electrolytes: Replete Potassium to >/= 3.6 and Magnesium to >/= 1.8  Code Status: DNR and DNI  Dispo:  Continue MPCU    Team Contact Information:   Primary Team: Infectious Disease ([facility])  Primary Resident: Seleta Rhymes, MD  Resident's Pager: [phone] (Infect Disease Intern - Tower)    Interval History:   No acute events overnight. Patient desaturating to mid 80's on 8L [place]. Patient did trial of Venturi mask and was still desaturating and thus patient was transitioned to HFNC.    ROS negative except for where otherwise noted above.    Objective:   Temp:  [36.2 ??C (97.1 ??F)-36.9 ??C (98.4 ??F)] 36.6 ??C (97.9 ??F)  Heart Rate:  [78-93] 86  SpO2 Pulse:  [78-93] 85  Resp:  [18-31] 27  BP: (98-111)/(63-78) 98/63  FiO2 (%):  [50 %-60 %] 50 %  SpO2:  [86 %-100 %] 97 %,   Intake/Output Summary (Last 24 hours) at 12/16/2022 1424  Last data filed at 12/16/2022 1120  Gross per 24 hour   Intake 340 ml   Output 550 ml   Net -210 ml   , FiO2 (%):  [50 %-60 %] 50 %  O2 Device: Heated high flow nasal cannula  O2 Flow Rate (L/min):  [8 L/min-40 L/min] 40 L/min    Gen: NAD, converses, appears comfortable on large bore nasal cannula  HENT: atraumatic, normocephalic  Heart: RRR  Lungs: CTAB, no crackles or wheezes, reduced breath sounds LUL   Abdomen: soft, NTND  Extremities: no edema     Seleta Rhymes, MD  Internal Medicine PGY 1

## 2022-12-16 NOTE — Unmapped (Signed)
Pt has been struggling all morning with her saturations dropping into the 70's.  I notified the Dr right away.  Now on 12L Lg bore I asked to put pt on High flow [place].  She is on 40L & 60% doing much better with a Sp02 of 97-98%

## 2022-12-16 NOTE — Unmapped (Signed)
Spiritual Care Visit Note     Assessment Summary: Spiritual Care offered, and prayer rendered by chaplain.       Clinical Encounter Type  Type of Visit: Initial visit  Care Provided To: Patient  Referral Source: Physician  On-Call Visit?: Yes  Reason for Visit: Routine spiritual support (Consult)  Minutes Spent: 1 hour                                           Spiritual Assessment  Faith: [employer]  Presenting Concern(s): Meaning/Purpose, Grief and loss, Religious or spiritual distress, losing hope in God  Beliefs: Prayer.    Emotions: Feels hopeless.  Community: Faith community, Family  Needs: Rest from weariness and strength to be healthier.  Hopes: Healing  Resources: Faith. Family.  Health care treatment.  Health care team.     Spiritual Care Interventions  Interventions Made: Established relationship of care and support, Compassionate presence, Reflective listening, Supported patient's sources of spiritual strength, Prayer, Non-anxious presence, Self-care teaching/encouragement and prayer  Outcomes: Appreciated Chaplain visit, Open to continued care  Acceptance/understands situation                              Spiritual Care Plan  Spiritual Care Plan: Chaplain will continue to follow.         Signed: Doy Hutching. Janae Bonser, Chaplain 4:00 pm 12/16/2022

## 2022-12-16 NOTE — Unmapped (Signed)
Pt is A&Ox4, VSS, 1st degree AV block. O2 sats >90% on 8L [place].  Afebrile. No c/o pain. UO inadequate, stand by assist. 0 BMs this shift. Pt's appetite has been adequate. Scattered bruising noted, Bilateral lower extremity edema present. ACHS BG, pt. repositions self and standard precautions maintained. No falls/injuries this shift. Family at bedside. All monitors with appropriate alarm settings, call bell within reach, see flowsheets/MAR for further info.        Problem: Adult Inpatient Plan of Care  Goal: Plan of Care Review  Outcome: Ongoing - Unchanged  Goal: Patient-Specific Goal (Individualized)  Outcome: Ongoing - Unchanged  Goal: Absence of Hospital-Acquired Illness or Injury  Outcome: Ongoing - Unchanged  Intervention: Identify and Manage Fall Risk  Recent Flowsheet Documentation  Taken 12/15/2022 2000 by Lendell Caprice, RN  Safety Interventions:   aspiration precautions   bleeding precautions   environmental modification   fall reduction program maintained   infection management   isolation precautions   lighting adjusted for tasks/safety   low bed   nonskid shoes/slippers when out of bed   room near unit station  Intervention: Prevent Skin Injury  Recent Flowsheet Documentation  Taken 12/16/2022 0400 by Lendell Caprice, RN  Positioning for Skin: Right  Taken 12/16/2022 0200 by Lendell Caprice, RN  Positioning for Skin: Left  Taken 12/16/2022 0000 by Lendell Caprice, RN  Positioning for Skin: Right  Taken 12/15/2022 2200 by Lendell Caprice, RN  Positioning for Skin: Left  Taken 12/15/2022 2000 by Lendell Caprice, RN  Positioning for Skin: (Sitting in bed) Other (Comment)  Intervention: Prevent Infection  Recent Flowsheet Documentation  Taken 12/15/2022 2000 by Lendell Caprice, RN  Infection Prevention:   environmental surveillance performed   equipment surfaces disinfected   hand hygiene promoted   personal protective equipment utilized   single patient room provided  Goal: Optimal Comfort and Wellbeing  Outcome: Ongoing - Unchanged  Goal: Readiness for Transition of Care  Outcome: Ongoing - Unchanged  Goal: Rounds/Family Conference  Outcome: Ongoing - Unchanged     Problem: Fall Injury Risk  Goal: Absence of Fall and Fall-Related Injury  Outcome: Ongoing - Unchanged  Intervention: Promote Injury-Free Environment  Recent Flowsheet Documentation  Taken 12/15/2022 2000 by Lendell Caprice, RN  Safety Interventions:   aspiration precautions   bleeding precautions   environmental modification   fall reduction program maintained   infection management   isolation precautions   lighting adjusted for tasks/safety   low bed   nonskid shoes/slippers when out of bed   room near unit station     Problem: Gas Exchange Impaired  Goal: Optimal Gas Exchange  Outcome: Ongoing - Unchanged  Intervention: Optimize Oxygenation and Ventilation  Recent Flowsheet Documentation  Taken 12/16/2022 0400 by Lendell Caprice, RN  Head of Bed [facility]) Positioning: HOB at 30-45 degrees  Taken 12/16/2022 0200 by Lendell Caprice, RN  Head of Bed [facility]) Positioning: HOB at 30-45 degrees  Taken 12/16/2022 0000 by Lendell Caprice, RN  Head of Bed [facility]) Positioning: HOB at 30-45 degrees  Taken 12/15/2022 2200 by Lendell Caprice, RN  Head of Bed [facility]) Positioning: HOB at 30-45 degrees  Taken 12/15/2022 2000 by Lendell Caprice, RN  Head of Bed [facility]) Positioning: HOB at 30-45 degrees     Problem: Comorbidity Management  Goal: Blood Glucose Levels Within Targeted Range  Outcome: Ongoing - Unchanged  Goal: Blood Pressure in Desired Range  Outcome: Ongoing - Unchanged

## 2022-12-17 LAB — BASIC METABOLIC PANEL
ANION GAP: 8 mmol/L (ref 5–14)
BLOOD UREA NITROGEN: 33 mg/dL — ABNORMAL HIGH (ref 9–23)
BUN / CREAT RATIO: 21
CALCIUM: 9.7 mg/dL (ref 8.7–10.4)
CHLORIDE: 102 mmol/L (ref 98–107)
CO2: 29 mmol/L (ref 20.0–31.0)
CREATININE: 1.6 mg/dL — ABNORMAL HIGH
EGFR CKD-EPI (2021) FEMALE: 37 mL/min/{1.73_m2} — ABNORMAL LOW (ref >=60)
GLUCOSE RANDOM: 129 mg/dL (ref 70–179)
POTASSIUM: 4.3 mmol/L (ref 3.4–4.8)
SODIUM: 139 mmol/L (ref 135–145)

## 2022-12-17 LAB — PRO-BNP: PRO-BNP: 3235 pg/mL — ABNORMAL HIGH (ref <=300.0)

## 2022-12-17 LAB — IONIZED CALCIUM VENOUS: CALCIUM IONIZED VENOUS (MG/DL): 4.72 mg/dL (ref 4.40–5.40)

## 2022-12-17 LAB — LACTATE, VENOUS, WHOLE BLOOD: LACTATE BLOOD VENOUS: 1.7 mmol/L (ref 0.5–1.8)

## 2022-12-17 MED ADMIN — gabapentin (NEURONTIN) capsule 300 mg: 300 mg | ORAL | @ 13:00:00

## 2022-12-17 MED ADMIN — gabapentin (NEURONTIN) capsule 300 mg: 300 mg | ORAL | @ 19:00:00

## 2022-12-17 MED ADMIN — sildenafiL (pulm.hypertension) (REVATIO) tablet 40 mg: 40 mg | ORAL | @ 19:00:00

## 2022-12-17 MED ADMIN — iloprost (VENTAVIS) nebulizer solution: 5 ug | RESPIRATORY_TRACT | @ 22:00:00

## 2022-12-17 MED ADMIN — azithromycin (ZITHROMAX) tablet 250 mg: 250 mg | ORAL | @ 13:00:00 | Stop: 2025-08-25

## 2022-12-17 MED ADMIN — gabapentin (NEURONTIN) capsule 300 mg: 300 mg | ORAL | @ 01:00:00

## 2022-12-17 MED ADMIN — arformoterol (BROVANA) nebulizer solution 15 mcg/2 mL: 15 ug | RESPIRATORY_TRACT | @ 01:00:00

## 2022-12-17 MED ADMIN — ipratropium-albuterol (DUO-NEB) 0.5-2.5 mg/3 mL nebulizer solution 3 mL: 3 mL | RESPIRATORY_TRACT | @ 01:00:00

## 2022-12-17 MED ADMIN — enoxaparin (LOVENOX) syringe 40 mg: 40 mg | SUBCUTANEOUS | @ 01:00:00

## 2022-12-17 MED ADMIN — ipratropium-albuterol (DUO-NEB) 0.5-2.5 mg/3 mL nebulizer solution 3 mL: 3 mL | RESPIRATORY_TRACT | @ 12:00:00

## 2022-12-17 MED ADMIN — insulin lispro (HumaLOG) injection 3 Units: 3 [IU] | SUBCUTANEOUS | @ 19:00:00

## 2022-12-17 MED ADMIN — sodium chloride 3 % NEBULIZER solution 4 mL: 4 mL | RESPIRATORY_TRACT | @ 12:00:00

## 2022-12-17 MED ADMIN — insulin lispro (HumaLOG) injection 0-20 Units: 0-20 [IU] | SUBCUTANEOUS | @ 16:00:00

## 2022-12-17 MED ADMIN — budesonide (PULMICORT) nebulizer solution 0.5 mg: .5 mg | RESPIRATORY_TRACT | @ 01:00:00

## 2022-12-17 MED ADMIN — melatonin tablet 6 mg: 6 mg | ORAL | @ 01:00:00

## 2022-12-17 MED ADMIN — magnesium oxide-Mg AA chelate (Magnesium Plus Protein) 2 tablet: 2 | ORAL | @ 13:00:00

## 2022-12-17 MED ADMIN — insulin glargine (LANTUS) injection 12 Units: 12 [IU] | SUBCUTANEOUS | @ 01:00:00

## 2022-12-17 MED ADMIN — iloprost (VENTAVIS) nebulizer solution: 5 ug | RESPIRATORY_TRACT | @ 19:00:00

## 2022-12-17 MED ADMIN — acetaminophen (TYLENOL) tablet 650 mg: 650 mg | ORAL | @ 23:00:00

## 2022-12-17 MED ADMIN — sildenafiL (pulm.hypertension) (REVATIO) tablet 40 mg: 40 mg | ORAL | @ 13:00:00

## 2022-12-17 MED ADMIN — iloprost (VENTAVIS) nebulizer solution: 5 ug | RESPIRATORY_TRACT | @ 04:00:00

## 2022-12-17 MED ADMIN — iloprost (VENTAVIS) nebulizer solution: 5 ug | RESPIRATORY_TRACT | @ 12:00:00

## 2022-12-17 MED ADMIN — sildenafiL (pulm.hypertension) (REVATIO) tablet 40 mg: 40 mg | ORAL | @ 01:00:00

## 2022-12-17 MED ADMIN — acetaminophen (TYLENOL) tablet 650 mg: 650 mg | ORAL | @ 10:00:00

## 2022-12-17 MED ADMIN — budesonide (PULMICORT) nebulizer solution 0.5 mg: .5 mg | RESPIRATORY_TRACT | @ 12:00:00

## 2022-12-17 MED ADMIN — ipratropium-albuterol (DUO-NEB) 0.5-2.5 mg/3 mL nebulizer solution 3 mL: 3 mL | RESPIRATORY_TRACT | @ 19:00:00

## 2022-12-17 MED ADMIN — magnesium oxide-Mg AA chelate (Magnesium Plus Protein) 2 tablet: 2 | ORAL | @ 01:00:00

## 2022-12-17 MED ADMIN — iloprost (VENTAVIS) nebulizer solution: 5 ug | RESPIRATORY_TRACT | @ 16:00:00

## 2022-12-17 MED ADMIN — iloprost (VENTAVIS) nebulizer solution: 5 ug | RESPIRATORY_TRACT | @ 01:00:00

## 2022-12-17 MED ADMIN — furosemide (LASIX) injection 40 mg: 40 mg | INTRAVENOUS | @ 16:00:00 | Stop: 2022-12-17

## 2022-12-17 MED ADMIN — lidocaine (ASPERCREME) 4 % 1 patch: 1 | TRANSDERMAL | @ 14:00:00

## 2022-12-17 MED ADMIN — sodium chloride 3 % NEBULIZER solution 4 mL: 4 mL | RESPIRATORY_TRACT | @ 01:00:00

## 2022-12-17 MED ADMIN — arformoterol (BROVANA) nebulizer solution 15 mcg/2 mL: 15 ug | RESPIRATORY_TRACT | @ 12:00:00

## 2022-12-17 MED ADMIN — posaconazole (NOXAFIL) delayed released tablet 200 mg: 200 mg | ORAL | @ 13:00:00

## 2022-12-17 NOTE — Unmapped (Signed)
Patient is A&O x 4. NSR on monitor. HR 70-80s SBP 90-100s with MAP > 60. O2 sats > 88% on 60% 50L HFNC. Patient increased from 50 to 60% per pt request d/t SOB. RT and MD notified. Patient reported improvement in WOB. Currently satting 97%. No complaints of pain. Adequate urine output. 1 BM this shift. Patient reports poor appetite. Currently sitting up in chair. Free from falls and injury. Call bell within reach. See flowsheets/MAR for further info.    Problem: Adult Inpatient Plan of Care  Goal: Optimal Comfort and Wellbeing  Outcome: Ongoing - Unchanged  Goal: Readiness for Transition of Care  Outcome: Ongoing - Unchanged     Problem: Gas Exchange Impaired  Goal: Optimal Gas Exchange  Outcome: Ongoing - Unchanged  Intervention: Optimize Oxygenation and Ventilation  Recent Flowsheet Documentation  Taken 12/17/2022 0400 by Otis Dials, RN  Head of Bed [facility]) Positioning: HOB at 30-45 degrees  Taken 12/17/2022 0200 by Otis Dials, RN  Head of Bed [facility]) Positioning: HOB at 30-45 degrees  Taken 12/17/2022 0000 by Otis Dials, RN  Head of Bed [facility]) Positioning: HOB at 30-45 degrees  Taken 12/16/2022 2200 by Otis Dials, RN  Head of Bed [facility]) Positioning: HOB at 30-45 degrees  Taken 12/16/2022 2000 by Otis Dials, RN  Head of Bed [facility]) Positioning: HOB at 30-45 degrees

## 2022-12-17 NOTE — Unmapped (Signed)
Infectious Disease (MEDK) Progress Note    Assessment & Plan:   Karen Barber is a 57 y.o. female whose presentation is complicated by bronchiectasis type 1 in the setting of pulmonary sarcoidosis (6L [place] home O2), HTN, T2DM, COPD, s/p LUL lobectomy, and asthma that presented to [facility] admitted to MICU after becoming hypoxic after holding home meds prior to PET scan. After 1 week diuresing ICU stabilized patient she was transferred to the MPCU.    Principal Problem:    Acute on chronic hypoxic respiratory failure (CMS-HCC)  Active Problems:    Pulmonary sarcoidosis (CMS-HCC)    Type 2 diabetes mellitus with hyperglycemia (CMS-HCC)    COPD (chronic obstructive pulmonary disease) (CMS-HCC)    Chronic pulmonary aspergillosis (CMS-HCC)    Pulmonary hypertension (CMS-HCC)    AKI (acute kidney injury) (CMS-HCC)    Active Problems  #Acute on Chronic Hypoxemic Respiratory Failure -   #Pulmonary Sarcoidosis -   #COPD/asthma -   #Bronchiectasis  Patient has notable history of pulmonary sarcoidosis, COPD, pulmonary hypertension and is on baseline oxygen 6 L.  Recent admission with pneumonia growing Serratia marcescens  s/p 1x Rocephin (8/18), Vancomycin (8/18-8/19), Zosyn (8/18-8/23) discharged on levofloxacin 500 mg ending on 8/31.  Patient held her medications only for the morning prior to an outpatient PET CT scan during which she became hypoxic to the 70s and was admitted to the MICU.  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 She received one dose of 125mg  of IV methylprednisolone. She was weaned from high flow nasal cannula to large bore nasal cannula after IV diuresis and a course of antibiotics for possible pneumonia with Vanco/cefepime later transition to levofloxacin with a total 1 week treatment with antibiotics. Started on vanc/cefepime for broad coverage --> stopped vanc and cefepime 9/12- then levaquin (9/12---9/16).  Patient was transferred to the floor on 9/18 after showing significant signs of improvement but still having occasional desaturations with ambulation. PET scan 9/18 is not indicative of active pulmonary sarcoidosis.  Acute exacerbation likely multifactorial including in not taking her home medications as per directed for airway clearance, volume overload, and higher O2 requirement chronically than the amount she has been using. RHC 9/20 showing significant pulm HTN disease with mean PA pressure of 47, for which pulmonology team started Iloprost. Notably, wedge pressure on that study was 5, which is reassuring against volume as driver of her respiratory status at that time. Patient with increasing O2 requirements and now requiring HFNC after saturating to mid-80s even with 8L and Venturi mask. Possibly patient is volume overloaded from holding Lasix, however only +1 pitting edema noted on physical exam that has been stable. Will trial of IV Lasix 40 to assess response.    -- plan to get oxymizer for her while admitted. Will need oxymizer at home  -- pulmonology, appreciate recommendations  -- Continue home airway clearance (DuoNebs and HTS 3 times daily with [place])  -- Continue home Pulmicort, Brovana, Atrovent, as needed albuterol, and azithromycin  -- Trial of 1 time IV 40mg  Lasix due to worsening hypoxemia   -- Consider resuming Lasix PO 80 mg twice daily on 9/28   -- CXR unchanged from prior CT on 9/25  -- PT and OT consult     #Pulmonary hypertension - RV failure - orthostatic hypotension  Patient has history of pulmonary hypertension due to pulmonary sarcoidosis/COPD.  TTE this admission 9/17 with severely dilated right atrium with severe pulmonary hypertension PASP 86 mmHg.  Estimated right atrial pressure 5  to 10 mmHg.  On Lasix 80 mg twice daily at home.  Echo showed LVEF greater than 55% and severe pulmonary hypertension no significant change since prior study.  Right heart cath showed severe pulmonary Hypertension, markedly elevated PVR (9.3 - 10.5) and low cardiac output / index. HRCT scan shows unchanged fibrotic pulmonary sarcoidosis with bilateral segmental and subsegmental fibrosis post left upper lobectomy with unchanged apical right upper lobe and superior segment left lower lobe bullae.   -- Continue inhaled iloprost 5 mcg 6 times a day q4h; please do not mix with other nebulizer solutions   -- Resuming Lasix 80 mg BID on 9/29, continue home Sildenafil 40mg  tid  -- trend pro BNP, strict I/Os, Daily weights  -- Plan to transition to Tyvaso nebs on discharge. Approval pending   -- [facility] rep to do teaching prior to DC    #Acute kidney injury on CKD3 (downtrending)  On admission patient's creatinine 1.31 which appears to be baseline.  On 9/19 patient was found to have an AKI by definition of > 0.3 increase in creatinine in 48 hours.  -- Check lactate f/up   -- Renally dose medications  -- Daily BMP    #Secondary Hyperparathyroidism, Hypocalcemia, and Hyperphosphatemia  Findings are consistent with secondary hyperparathyroidism due to CKD.  Ionized calcium: 4.37 mg/dL (low)  Phosphate: 5.6 mg/dL (elevated)  PTH: 630.1 pg/mL (elevated)  Vitamin D Total (25OH) 35  -- Reconsider need for future oral calcium carbonate 1600 mg TID  -- Reconsider need for future phoslo 667 mg PO TID   -- Reassess PTH levels in 4-6 weeks.    #Type 2 diabetes mellitus with hyperglycemia and hypoglycemia  Hemoglobin A1c 8.3%.  Home regimen includes glargine 18 units nightly and sliding scale insulin.  Large fluctuations in glucose greater than 300 and occasional values ~50.  -- Sliding scale insulin insulin sensitivity factor 40  --12 glargine nightly  -- 3 units lispro tid     #Left leg swelling (resolved )  Patient endorses intermittent left > right leg swelling. Venous doppler unremarkable.      Chronic Problems     #Chronic Pain   - Continuing home gabapentin   - PT/OT consulted, appreciate recs     #Chronic pulmonary aspergillosis  Patient has history of aspergilloma and is on chronic posaconazole. Soluble IL-2 receptor, Aspergillus Galactomannan Antigen, IgE checked 10/2022 all wnl  -Continue home posaconazole Ppx  -Outpatient evaluation for [facility] scheduled for 10/25 to evaluate recurrent hemoptysis     #Iron deficiency anemia  #Chronic normocytic anemia (baseline hemoglobin 11)  Patient has some mild normocytic anemia with hemoglobin of 11. Iron panel shows iron of 37 with iron saturation of 15.   --Iron supplementation with iron dextran 1,000mg  in 250cc    #Hyperlipidemia  Last lipid profile showed an LDL of 146  -- Follow-up outpatient    Issues Impacting Complexity of Management:  -The patient is at high risk for the development of complications of volume overload due to the need to provide IV hydration for suspected hypovolemia in the setting of: Pulmonary hypertension  -Need for the following intensive monitoring parameter(s) due to high risk of clinical decline: continuous oxygen monitoring, telemetry, and frequent monitoring of urine output to assess for the efficacy of diuretic regimen  -Need for intensive oxygen therapy of HFNC, which places the patient at high risk for oxygen toxicity  -The patient is at high risk of complications from pulmonary hypertension    Daily Checklist:  Diet: Regular  Diet   DVT PPx: Lovenox 40mg  q24h  Electrolytes: Replete Potassium to >/= 3.6 and Magnesium to >/= 1.8  Code Status: DNR and DNI  Dispo:  Continue MPCU    Team Contact Information:   Primary Team: Infectious Disease ([facility])  Primary Resident: Cydney Ok, MD  Resident's Pager: [phone] (Infect Disease Intern - Tower)    Interval History:   No acute events overnight.     Patient with no new concerns this morning. She is discouraged by her set-back from an oxygen standpoint, but remains hopeful for improvement. Continues to cough up pink-tinged sputum, feels that she is producing more sputum since they started giving her HTS nebulizer treatment as a separate treatment from her other inhaled medications.    ROS negative except for where otherwise noted above.    Objective:   Temp:  [36.3 ??C (97.4 ??F)-36.9 ??C (98.4 ??F)] 36.4 ??C (97.5 ??F)  Heart Rate:  [76-90] 80  SpO2 Pulse:  [75-88] 77  Resp:  [14-28] 14  BP: (91-102)/(63-68) 100/66  FiO2 (%):  [50 %-60 %] 50 %  SpO2:  [86 %-100 %] 99 %,   Intake/Output Summary (Last 24 hours) at 12/17/2022 0704  Last data filed at 12/17/2022 0330  Gross per 24 hour   Intake 360 ml   Output 1400 ml   Net -1040 ml   , FiO2 (%):  [50 %-60 %] 50 %  O2 Device: Heated high flow nasal cannula  O2 Flow Rate (L/min):  [8 L/min-50 L/min] 50 L/min    Gen: NAD, converses, appears comfortable on large bore nasal cannula  HENT: atraumatic, normocephalic  Heart: RRR  Lungs: CTAB, no crackles or wheezes, reduced breath sounds LUL   Abdomen: soft, NTND  Extremities: no edema

## 2022-12-17 NOTE — Unmapped (Signed)
Pulmonary Consult Service  Consult Follow Up Note      Primary Service:   Medicine  Primary Service Attending:  Ebony Hail, MD  Reason for Consult:   Assistance with diuresis    ASSESSMENT and PLAN     Karen Barber is a 57 y.o. 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 Pt was found to have acute on chronic respiratory failure (sat in 70s on home 6L) in the setting of not taking her home medications (sildenafil, lasix, nebs, inhalers) in preparation for PET scan, admitted 9/11. She was admitted to the ICU initially, has since been weaned back to 6-8L [place] with airway clearance and diuresis.     Her acute on chronic hypoxemia and increase O2 requirement was likely multifactorial -- combination of volume overload as well as lack of airway clearance at home, however I think chronically her O2 requirement (related to her PH) is higher than the amount she has been on at home and she continues to have severe exertional hypoxemia. More recently (9/28) she has had increased O2 requirements in the setting of holding diuretics, recommend restarting as below.    RV Failure  PAH (Group 3, 5)  TTE 9/17 showed severe pulmonary HTN (PASP ), severely dilated RV with mildly reduced systolic function, moderately dilated RA. Likely multifactorial in the setting of known COPD, pulmonary sarcoidosis. Pro-BNP still elevated at 3,125. Since admission cumulatively -16.2 L and pt appears to be euvolemic on exam. RHC 9/20 demonstrated severe pulmonary hypertension, markedly elevated PVR (9.3-10.5) and low cardiac output/index. HRCT completed for PH-ILD diagnosis and Tyvaso enrollment sent to [facility]. 9/26 pt received her oxymizer. She attempted to perform but had a drop in BP to 63/50 after walking several feet and ultimately had a rapid response called. She was able to be assisted to her bed with recovery of BP to 90s/50s and sat of 93% on 7L. Received a 1x bolus of of LR. 12/14/22.  At the time of exam on 12/17/22, she was on 50L/50% of HFNC with O2 sat of 100%    Plan:  - s/p IV lasix 40 mg today. Would hold off lasix IV for today given low BP this AM.   - Will likely be good with po lasix 80 mg starting tomorrow  - monitor O2 and BP, she has quite a narrow window to optimize volume and oxygenation at the same time  - Please try large bore 8L if satting above 95% with HFNC  - Continue sildenafil 40 mg  - Continue inhaled iloprost 5 mcg q4h. Please do not mix with other nebulizer solutions  - Plan to transition to Tyvaso nebs on discharge (arranged with [facility] clinic RN). Approval is still pending  - Continue daily weight with strict I/O  - cont daily pro-BNP    Acute on Chronic Hypoxemic Respiratory Failure  COPD/Asthma  Pulmonary Sarcoidosis  Bronchiectasis s/s sarcoid  Soluble IL-2 receptor, Aspergillus Galactomannan Antigen, IgE checked 10/2022 all wnl. PET scan 9/17 does not appear to be active pulmonary sarcoidosis and again it is overall felt to be burned out from sarcoidosis standpoint. Patient says this is the best that she's felt since coming to the hospital. Unable to complete due to recurrent nosebleeds and required 15L via NRB. Currently  requiring 7-8 L [place] with appropriate airway clearance. It is likely she has higher O2 requirement chronically than the amount she has been using at home. This may reflect her new baseline and this was discussed with the patient.   Plan:  - continue home pulmicort, brovana, PRN albuterol, azithromycin  - please cont airway clearance:   - duonebs TID and HTS (3%) BID with aerobika    Chronic Pulmonary Aspergillosis   Aspergillus Galactomannan Antigen, IgE checked 10/2022 all wnl.   - continue home posaconazole   - pt has outpt evaluation for [facility] scheduled for 10/25 for history of recurrent hemoptysis    Karen Barber was seen, examined and discussed with  Dr. Nada Libman  Thank you for involving [place] in her care. We look forward to following with you.  Recommendations were communicated to the primary team at 10:14 AM  Please page [place] with any questions or concerns at [phone] (pulmonary consult fellow).    Theda Belfast, MD  Pulmonary Critical Care Fellow     HISTORY:     Interval Hx:   Overall unstable O2 and BP balance    History of Present Illness:  Karen Barber is a 57 y.o. female with hx pf pulmonary sarcoidosis, COPD, PAH (baseline home O2 6L), chronic pulmonary aspergillosis, s/p LUL lobectomy 2016 who initially presented to the hospital for a PET scan to evaluate her sarcoidosis.     Pt was found to have acute on chronic respiratory failure (sat in 70s on home 6L) in the setting of not taking her home medications (sildenafil, lasix, nebs, inhalers) in preparation for PET scan. As her respiratory status did not improve with NRB, placed on HFNC. 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 She received one dose of 125mg  of IV methylprednisolone and then was transferred to the MICU for further management of her hypoxia. Pt had a week long stay in the MICU and was weaned from HFNC to [facility] after IV diuresis and course of antibiotics (completed course of levaquin) for possible pneumonia. Pt has been continuing home airway clearance (Aerobika and HTS neb), and home pulmicort, brovana, atrovent, and PRN albuterol. Showing significant signs of improvement but still having desaturations with ambulation.     Pt started iloprost 9/19 and has tolerated it well [name] far. RHC 9/20 demonstrated severe pulmonary hypertension, markedly elevated PVR (9.3-10.5) and low cardiac output/index. HRCT chest showed unchanged fibrotic pulmonary sarcoidosis with bilateral segmental and subsegmental fibrosis post left upper lobectomy with unchanged apical right upper lobe and superior segment left lower lobe bullae.     Of note, has been hospitalized twice prior to this in the past 6 weeks.  8/18-8/25: presented with increased O2 requirement, increased swelling, brown sputum production.   - treated for serratia pneumonia (completed levaquin)  - Was discharged on AVAPS for home, as well as with HTS/albuterol and [place].  10/21/22-10/25/22: presented with DOE, up to 8-10L with exertion and desats to 70%.   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 A.niger from sputum 08/2016-09/2016.    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    Sarcoidosis 1995    Type 2 diabetes mellitus with hyperglycemia (CMS-HCC) 07/27/2014    Visual impairment     glasses     Past Surgical History:   Procedure Laterality Date    LUNG LOBECTOMY      PR AMPUTATION TOE,MT-P JT Right 12/12/2019    Procedure: Right foot fifth toe amputation at the metatarsophalangeal joint level;  Surgeon: Britt Bottom, DPM;  Location: MAIN OR [facility];  Service: Vascular    PR AMPUTATION TOE,MT-P JT Right 06/16/2020    Procedure: Right foot amputation of 3rd toe at mpj level;  Surgeon: Britt Bottom, DPM;  Location: MAIN OR [facility];  Service: Vascular    PR RIGHT HEART CATH O2 SATURATION & CARDIAC OUTPUT N/A 04/29/2019    Procedure: Right Heart Catheterization;  Surgeon: Rosana Hoes, MD;  Location: [facility] CATH;  Service: Cardiology    PR RIGHT HEART CATH O2 SATURATION & CARDIAC OUTPUT N/A 12/08/2022    Procedure: Right Heart Catheterization;  Surgeon: Neal Dy, MD;  Location: [facility] CATH;  Service: Cardiology    PR THORACOSCOPY SURG TOT PULM DECORT Left 12/14/2014    Procedure: THORACOSCOPY SURG; W/TOT PULM DECORTIC/PNEUMOLYS;  Surgeon: Evert Kohl, MD;  Location: MAIN OR [facility];  Service: Cardiothoracic    PR THORACOSCOPY W/THERA WEDGE RESEXN INITIAL UNILAT Left 12/03/2014    Procedure: THORACOSCOPY, SURGICAL; WITH THERAPEUTIC WEDGE RESECTION (EG, MASS, NODULE) INITIAL UNILATERAL;  Surgeon: Evert Kohl, MD;  Location: MAIN OR [facility];  Service: Cardiothoracic       Other History:  Family History   Problem Relation Age of Onset    Dementia Mother     Diabetes Father     Diabetes Brother     Diabetes Maternal Aunt     Sarcoidosis Maternal Aunt     Diabetes Maternal Uncle     Diabetes Paternal Aunt     Diabetes Paternal Uncle      Social History     Socioeconomic History    Marital status: Single   Tobacco Use    Smoking status: Former     Current packs/day: 0.00     Average packs/day: 0.5 packs/day for 9.0 years (4.5 ttl pk-yrs)     Types: Cigarettes     Start date: 05/02/1978     Quit date: 05/03/1987     Years since quitting: 35.6    Smokeless tobacco: Never   Vaping Use    Vaping status: Never Used   Substance and Sexual Activity    Alcohol use: No     Alcohol/week: 0.0 standard drinks of alcohol    Drug use: No    Sexual activity: Yes   Other Topics Concern    Do you use sunscreen? No    Tanning bed use? No    Are you easily burned? No    Excessive sun exposure? No    Blistering sunburns? No     Social Determinants of Health     Financial Resource Strain: Low Risk  (11/06/2022)  Overall Financial Resource Strain (CARDIA)     Difficulty of Paying Living Expenses: Not hard at all   Food Insecurity: No Food Insecurity (11/06/2022)    Hunger Vital Sign     Worried About Running Out of Food in the Last Year: Never true     Ran Out of Food in the Last Year: Never true   Transportation Needs: No Transportation Needs (11/06/2022)    PRAPARE - Therapist, art (Medical): No     Lack of Transportation (Non-Medical): No       Home Medications:  No current facility-administered medications on file prior to encounter.     Current Outpatient Medications on File Prior to Encounter   Medication Sig Dispense Refill    ACCU-CHEK GUIDE GLUCOSE METER Misc 1 Device by Other route Four (4) times a day (before meals and nightly). AS DIRECTED 1 kit 0    albuterol 2.5 mg /3 mL (0.083 %) nebulizer solution Inhale 3 mL (2.5 mg total) by nebulization every six (6) hours as needed for wheezing or shortness of breath (airway clearance/cough). 360 mL 3    albuterol HFA 90 mcg/actuation inhaler Inhale 2 puffs every four (4) hours as needed for wheezing or shortness of breath. 54 g 0    arformoterol (BROVANA) 15 mcg/2 mL Inhale 2 mL (15 mcg total) by nebulization in the morning and 2 mL (15 mcg total) in the evening. 120 mL 2    aspirin-caffeine (BAYER BACK AND BODY) 500-32.5 mg Tab Take by mouth. Takes 2 1/2 pills 3-4 times a day as needed.      azithromycin (ZITHROMAX) 250 MG tablet Take 1 tablet (250 mg total) by mouth daily. Start on 9/1 after completing the levofloxacin 90 tablet 0    blood sugar diagnostic Strp by Other route Three (3) times a day. ACCU-CHEK Guide meter 300 strip 1    budesonide (PULMICORT) 0.5 mg/2 mL nebulizer solution Inhale 2 mL (0.5 mg total) by nebulization in the morning and 2 mL (0.5 mg total) in the evening. 120 mL 2    furosemide (LASIX) 80 MG tablet Take 1 tablet (80 mg total) by mouth two (2) times a day. Take a third dose as needed for increasing weight or increasing swelling. 60 tablet 0    gabapentin (NEURONTIN) 300 MG capsule Take 1 capsule (300 mg total) by mouth Three (3) times a day. 270 capsule 3    insulin aspart (NOVOLOG U-100 INSULIN ASPART) 100 unit/mL vial Inject 0.01-0.2 mL (1-20 Units total) under the skin Four (4) times a day (before meals and nightly). SLIDING SCALE 20 mL 1    insulin glargine (BASAGLAR KWIKPEN U-100 INSULIN) 100 unit/mL (3 mL) injection pen Inject 0.18 mL (18 Units total) under the skin nightly. 15 mL 0    ipratropium (ATROVENT) 0.02 % nebulizer solution Inhale 2.5 mL (500 mcg total) by nebulization four (4) times a day. 300 mL 0    posaconazole (NOXAFIL) 100 mg delayed released tablet Take 2 tablets by mouth once daily 60 tablet 0    sildenafiL, pulm.hypertension, (REVATIO) 20 mg tablet Take 1 tablet (20 mg total) by mouth Three (3) times a day. 90 tablet 0    OXYGEN-AIR DELIVERY SYSTEMS MISC Dose: 2-3 LPM         In-Hospital Medications:  Scheduled:   arformoterol  15 mcg Nebulization BID (RT)    azithromycin  250 mg Oral Daily    budesonide  0.5 mg Nebulization BID (  RT)    enoxaparin (LOVENOX) injection  40 mg Subcutaneous Nightly    flu vacc [id number] 6mos up(PF)  0.5 mL Intramuscular During hospitalization    [Provider Hold] furosemide  80 mg Oral BID    gabapentin  300 mg Oral TID    iloprost  5 mcg Nebulization 6XD    insulin glargine  12 Units Subcutaneous Nightly    insulin lispro  0-20 Units Subcutaneous ACHS    insulin lispro  3 Units Subcutaneous 3xd Meals    ipratropium-albuterol  3 mL Nebulization TID (RT)    lidocaine  1 patch Transdermal Daily    magnesium oxide-Mg AA chelate  2 tablet Oral BID    polyethylene glycol  17 g Oral Daily    posaconazole  200 mg Oral Daily    sildenafiL (pulm.hypertension)  40 mg Oral TID    sodium chloride  4 mL Nebulization BID (RT)         PRN Medications:  acetaminophen, albuterol, calcium carbonate, dextrose in water, glucagon, melatonin, [EXPIRED] Implement **AND** [EXPIRED] Care order/instruction **AND** [EXPIRED] Vital signs **AND** Adult Oxygen therapy **AND** [EXPIRED] sodium chloride **AND** [EXPIRED] sodium chloride 0.9% **AND** [EXPIRED] diphenhydrAMINE **AND** [EXPIRED] famotidine **AND** methylPREDNISolone sodium succinate **AND** [EXPIRED] EPINEPHrine, ondansetron **OR** ondansetron, oxymetazoline, senna, sodium chloride    Allergies:  Allergies as of 11/29/2022    (No Known Allergies)         PHYSICAL EXAM:   BP 97/67  - Pulse 75  - Temp 36.5 ??C (97.7 ??F) (Oral)  - Resp 18  - Ht 180.3 cm (5' 11)  - Wt 86.7 kg (191 lb 3.2 oz)  - LMP  (LMP Unknown)  - SpO2 93%  - BMI 26.67 kg/m??   General: Alert, well-appearing, and in no distress.  Eyes: Anicteric sclera, conjunctiva clear.  ENT:   no drainage.  Cardiovascular: Regular rate and rhythm.  Pulmonary: Diminished lung sounds in bilateral upper lobe, crackles bilateral bases  Musculoskeletal: No clubbing.  Skin: No rashes or lesions., trace to 1+ LE edema  Neuro: No focal neurological deficits.    LABORATORY and RADIOLOGY DATA:     Pertinent Laboratory Data:  Lab Results   Component Value Date    WBC 4.0 12/16/2022    HGB 10.6 (L) 12/16/2022    HCT 33.2 (L) 12/16/2022    PLT 168 12/16/2022       Lab Results   Component Value Date    NA 139 12/17/2022    K 4.3 12/17/2022    CL 102 12/17/2022    CO2 29.0 12/17/2022    BUN 33 (H) 12/17/2022    CREATININE 1.60 (H) 12/17/2022    GLU 129 12/17/2022    CALCIUM 9.7 12/17/2022    MG 2.1 12/14/2022    PHOS 5.4 (H) 12/16/2022       Lab Results   Component Value Date    BILITOT 0.7 12/14/2022    BILIDIR <0.10 05/30/2019    PROT 6.9 12/14/2022    ALBUMIN 3.5 12/16/2022    ALT 11 12/14/2022    AST 15 12/14/2022    ALKPHOS 70 12/14/2022    GGT 60 (H) 05/31/2011       Lab Results   Component Value Date    INR 1.02 12/11/2019    APTT 30.9 12/11/2019       Pertinent Micro Data:  Microbiology Results (last day)       ** No results found for the last 24 hours. **  Pertinent Imaging Data:  9/11 CXR   - Extensive fibrotic changes and biapical bulla appear grossly unchanged. Extensive pulmonary fibrosis limits evaluation for superimposed infection.     9/17 Echo    Summary    1. Limited study.    2. The left ventricle is normal in size with normal wall thickness.    3. The left ventricular systolic function is normal, LVEF is visually  estimated at > 55%.    4. The right ventricle is severely dilated in size, with mildly reduced  systolic function.    5. There is severe pulmonary hypertension.    6. TR maximum velocity: 4.4 m/s  Estimated PASP: 86 mmHg.    7. IVC size and inspiratory change suggest mildly elevated right atrial  pressure. (5-10 mmHg).    8. No significant change since prior study from 10/23/2022    9/18 PET CT  Impression   Bulky, partially calcified lymph nodes in the mediastinum and bilateral hilar with no significant to mild uptake, decreased in intensity since prior.   Grossly unchanged metabolically active right supraclavicular lymph nodes, accounting for differences in positioning of the arms and lack of IV contrast.   Bronchiectasis and partially metabolically active reticular opacities are seen bilaterally, most prominent in the lower lobes, overall similar to prior.   Dilated main pulmonary artery measuring up to 4.0 cm, suggestive of pulmonary arterial hypertension.     9/20 RHC  FINAL CARDIAC CATHETERIZATION REPORT    CONCLUSIONS:  - Severe pulmonary Hypertension  - Markedly elevated PVR (9.3 - 10.5)  - Low cardiac output / index     9/25 CXR  No significant interval change since the prior chest radiograph.     9/25 CT Chest High Resolution w/o contrast  Unchanged fibrotic pulmonary sarcoidosis with bilateral segmental and subsegmental fibrosis post left upper lobectomy with unchanged apical right upper lobe and superior segment left lower lobe bullae.     9/27 CXR  Fibrotic interstitial lung disease with biapical large bullae which are better characterized on the prior CT chest. No significant interval changes.

## 2022-12-18 LAB — CBC
HEMATOCRIT: 31.6 % — ABNORMAL LOW (ref 34.0–44.0)
HEMOGLOBIN: 10.3 g/dL — ABNORMAL LOW (ref 11.3–14.9)
MEAN CORPUSCULAR HEMOGLOBIN CONC: 32.6 g/dL (ref 32.0–36.0)
MEAN CORPUSCULAR HEMOGLOBIN: 27.2 pg (ref 25.9–32.4)
MEAN CORPUSCULAR VOLUME: 83.3 fL (ref 77.6–95.7)
MEAN PLATELET VOLUME: 8.8 fL (ref 6.8–10.7)
PLATELET COUNT: 151 10*9/L (ref 150–450)
RED BLOOD CELL COUNT: 3.79 10*12/L — ABNORMAL LOW (ref 3.95–5.13)
RED CELL DISTRIBUTION WIDTH: 17.7 % — ABNORMAL HIGH (ref 12.2–15.2)
WBC ADJUSTED: 3.1 10*9/L — ABNORMAL LOW (ref 3.6–11.2)

## 2022-12-18 LAB — BASIC METABOLIC PANEL
ANION GAP: 1 mmol/L — ABNORMAL LOW (ref 5–14)
BLOOD UREA NITROGEN: 36 mg/dL — ABNORMAL HIGH (ref 9–23)
BUN / CREAT RATIO: 23
CALCIUM: 9.2 mg/dL (ref 8.7–10.4)
CHLORIDE: 104 mmol/L (ref 98–107)
CO2: 34 mmol/L — ABNORMAL HIGH (ref 20.0–31.0)
CREATININE: 1.54 mg/dL — ABNORMAL HIGH
EGFR CKD-EPI (2021) FEMALE: 39 mL/min/{1.73_m2} — ABNORMAL LOW (ref >=60)
GLUCOSE RANDOM: 131 mg/dL (ref 70–179)
POTASSIUM: 4.3 mmol/L (ref 3.4–4.8)
SODIUM: 139 mmol/L (ref 135–145)

## 2022-12-18 LAB — IONIZED CALCIUM VENOUS: CALCIUM IONIZED VENOUS (MG/DL): 4.71 mg/dL (ref 4.40–5.40)

## 2022-12-18 LAB — PRO-BNP: PRO-BNP: 2836 pg/mL — ABNORMAL HIGH (ref <=300.0)

## 2022-12-18 LAB — LACTATE, VENOUS, WHOLE BLOOD: LACTATE BLOOD VENOUS: 1.6 mmol/L (ref 0.5–1.8)

## 2022-12-18 MED ADMIN — gabapentin (NEURONTIN) capsule 300 mg: 300 mg | ORAL | @ 19:00:00

## 2022-12-18 MED ADMIN — gabapentin (NEURONTIN) capsule 300 mg: 300 mg | ORAL

## 2022-12-18 MED ADMIN — sildenafiL (pulm.hypertension) (REVATIO) tablet 40 mg: 40 mg | ORAL | @ 19:00:00

## 2022-12-18 MED ADMIN — insulin lispro (HumaLOG) injection 3 Units: 3 [IU] | SUBCUTANEOUS | @ 22:00:00

## 2022-12-18 MED ADMIN — sildenafiL (pulm.hypertension) (REVATIO) tablet 40 mg: 40 mg | ORAL

## 2022-12-18 MED ADMIN — arformoterol (BROVANA) nebulizer solution 15 mcg/2 mL: 15 ug | RESPIRATORY_TRACT | @ 12:00:00

## 2022-12-18 MED ADMIN — magnesium oxide-Mg AA chelate (Magnesium Plus Protein) 2 tablet: 2 | ORAL | @ 13:00:00

## 2022-12-18 MED ADMIN — enoxaparin (LOVENOX) syringe 40 mg: 40 mg | SUBCUTANEOUS

## 2022-12-18 MED ADMIN — sodium chloride 3 % NEBULIZER solution 4 mL: 4 mL | RESPIRATORY_TRACT | @ 12:00:00

## 2022-12-18 MED ADMIN — iloprost (VENTAVIS) nebulizer solution: 5 ug | RESPIRATORY_TRACT | @ 17:00:00

## 2022-12-18 MED ADMIN — arformoterol (BROVANA) nebulizer solution 15 mcg/2 mL: 15 ug | RESPIRATORY_TRACT | @ 01:00:00

## 2022-12-18 MED ADMIN — diclofenac sodium (VOLTAREN) 1 % gel 2 g: 2 g | TOPICAL | @ 22:00:00

## 2022-12-18 MED ADMIN — sodium chloride 3 % NEBULIZER solution 4 mL: 4 mL | RESPIRATORY_TRACT | @ 01:00:00

## 2022-12-18 MED ADMIN — gabapentin (NEURONTIN) capsule 300 mg: 300 mg | ORAL | @ 13:00:00

## 2022-12-18 MED ADMIN — diclofenac sodium (VOLTAREN) 1 % gel 2 g: 2 g | TOPICAL | @ 11:00:00

## 2022-12-18 MED ADMIN — budesonide (PULMICORT) nebulizer solution 0.5 mg: .5 mg | RESPIRATORY_TRACT | @ 01:00:00

## 2022-12-18 MED ADMIN — sildenafiL (pulm.hypertension) (REVATIO) tablet 40 mg: 40 mg | ORAL | @ 13:00:00

## 2022-12-18 MED ADMIN — magnesium oxide-Mg AA chelate (Magnesium Plus Protein) 2 tablet: 2 | ORAL

## 2022-12-18 MED ADMIN — insulin lispro (HumaLOG) injection 3 Units: 3 [IU] | SUBCUTANEOUS | @ 15:00:00

## 2022-12-18 MED ADMIN — iloprost (VENTAVIS) nebulizer solution: 5 ug | RESPIRATORY_TRACT | @ 20:00:00

## 2022-12-18 MED ADMIN — furosemide (LASIX) tablet 80 mg: 80 mg | ORAL | @ 15:00:00 | Stop: 2022-12-18

## 2022-12-18 MED ADMIN — ipratropium-albuterol (DUO-NEB) 0.5-2.5 mg/3 mL nebulizer solution 3 mL: 3 mL | RESPIRATORY_TRACT | @ 18:00:00

## 2022-12-18 MED ADMIN — ipratropium-albuterol (DUO-NEB) 0.5-2.5 mg/3 mL nebulizer solution 3 mL: 3 mL | RESPIRATORY_TRACT | @ 12:00:00

## 2022-12-18 MED ADMIN — iloprost (VENTAVIS) nebulizer solution: 5 ug | RESPIRATORY_TRACT | @ 01:00:00

## 2022-12-18 MED ADMIN — ipratropium-albuterol (DUO-NEB) 0.5-2.5 mg/3 mL nebulizer solution 3 mL: 3 mL | RESPIRATORY_TRACT | @ 01:00:00

## 2022-12-18 MED ADMIN — budesonide (PULMICORT) nebulizer solution 0.5 mg: .5 mg | RESPIRATORY_TRACT | @ 12:00:00

## 2022-12-18 MED ADMIN — diclofenac sodium (VOLTAREN) 1 % gel 2 g: 2 g | TOPICAL

## 2022-12-18 MED ADMIN — insulin glargine (LANTUS) injection 12 Units: 12 [IU] | SUBCUTANEOUS | @ 01:00:00

## 2022-12-18 MED ADMIN — iloprost (VENTAVIS) nebulizer solution: 5 ug | RESPIRATORY_TRACT | @ 04:00:00

## 2022-12-18 MED ADMIN — melatonin tablet 6 mg: 6 mg | ORAL

## 2022-12-18 MED ADMIN — iloprost (VENTAVIS) nebulizer solution: 5 ug | RESPIRATORY_TRACT | @ 13:00:00

## 2022-12-18 MED ADMIN — posaconazole (NOXAFIL) delayed released tablet 200 mg: 200 mg | ORAL | @ 13:00:00

## 2022-12-18 MED ADMIN — azithromycin (ZITHROMAX) tablet 250 mg: 250 mg | ORAL | @ 13:00:00 | Stop: 2025-08-25

## 2022-12-18 NOTE — Unmapped (Addendum)
Pt A&O x4. VSS, NSR, soft BP but within parameters, afebrile. Pt weaned down to 8L LBNC from HFNC by RT. Pt expresses DOE, utilizing NRB to recover. No complaints of pain.   Pt remains on regular diet. Nutritional insulin given per order. Adequate UO. 1 BM.  All falls & safety protocols remain in place.       Problem: Adult Inpatient Plan of Care  Goal: Plan of Care Review  Outcome: Ongoing - Unchanged  Goal: Patient-Specific Goal (Individualized)  Outcome: Ongoing - Unchanged  Goal: Absence of Hospital-Acquired Illness or Injury  Outcome: Ongoing - Unchanged  Intervention: Identify and Manage Fall Risk  Recent Flowsheet Documentation  Taken 12/18/2022 0800 by Luanne Bras, RN  Safety Interventions:   aspiration precautions   bleeding precautions   commode/urinal/bedpan at bedside   fall reduction program maintained   infection management   low bed   nonskid shoes/slippers when out of bed  Intervention: Prevent and Manage VTE (Venous Thromboembolism) Risk  Recent Flowsheet Documentation  Taken 12/18/2022 1110 by Luanne Bras, RN  Anti-Embolism Intervention: Other (Comment)  Taken 12/18/2022 0730 by Luanne Bras, RN  Anti-Embolism Intervention: Other (Comment)  Intervention: Prevent Infection  Recent Flowsheet Documentation  Taken 12/18/2022 0800 by Luanne Bras, RN  Infection Prevention:   environmental surveillance performed   equipment surfaces disinfected   hand hygiene promoted   personal protective equipment utilized  Goal: Optimal Comfort and Wellbeing  Outcome: Ongoing - Unchanged  Goal: Readiness for Transition of Care  Outcome: Ongoing - Unchanged  Goal: Rounds/Family Conference  Outcome: Ongoing - Unchanged

## 2022-12-18 NOTE — Unmapped (Signed)
Patient is A&O x 4. VSS on 50%/50L HFNC. Tylenol given x 1 for lung pain with little improvement. Provider notified of pain, voltaren gel ordered. Patient also reporting increasing discomfort and swelling in lower extremities. LLE and foot noted to be more swollen than the right. Provider notified, no new orders. Patient assisted with elevating lower extremities while in bed. Adequate urine output via purewick. No BM overnight. Glycemic management maintained. Free from falls and injury. Call bell within reach. See flowsheets/MAR for further info.     Problem: Adult Inpatient Plan of Care  Goal: Optimal Comfort and Wellbeing  Outcome: Ongoing - Unchanged  Goal: Readiness for Transition of Care  Outcome: Ongoing - Unchanged     Problem: Comorbidity Management  Goal: Blood Glucose Levels Within Targeted Range  Outcome: Ongoing - Unchanged

## 2022-12-18 NOTE — Unmapped (Signed)
Pulmonary Consult Service  Consult Follow Up Note      Primary Service:   Medicine  Primary Service Attending:  Melven Sartorius, MD  Reason for Consult:   Assistance with diuresis    ASSESSMENT and PLAN     Karen Barber is a 57 y.o. 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 Pt was found to have acute on chronic respiratory failure (sat in 70s on home 6L) in the setting of not taking her home medications (sildenafil, lasix, nebs, inhalers) in preparation for PET scan, admitted 9/11. She was admitted to the ICU initially, has since been weaned back to 6-8L Karen Barber with airway clearance and diuresis.     Her acute on chronic hypoxemia and increase O2 requirement was likely multifactorial -- combination of volume overload as well as lack of airway clearance at home, however I think chronically her O2 requirement (related to her PH) is higher than the amount she has been on at home and she continues to have severe exertional hypoxemia. More recently (9/28) she has had increased O2 requirements in the setting of holding diuretics, recommend restarting as below.    RV Failure  PAH (Group 3, 5)  TTE 9/17 showed severe pulmonary HTN (PASP ), severely dilated RV with mildly reduced systolic function, moderately dilated RA. Likely multifactorial in the setting of known COPD, pulmonary sarcoidosis. Pro-BNP still elevated at 3,125. Since admission cumulatively -16.2 L and pt appears to be euvolemic on exam. RHC 9/20 demonstrated severe pulmonary hypertension, markedly elevated PVR (9.3-10.5) and low cardiac output/index. HRCT completed for PH-ILD diagnosis and Tyvaso enrollment sent to [facility]. 9/26 pt received her oxymizer.   At the time of exam on 12/18/22, she was on 40L/40% of HFNC with O2 sat of 100%, her O2 requirements have increased over the past several days, but are now stable. Likely related to volume/holding diuretics (for hypotension).    Plan:  - s/p PO lasix 80 mg today. Continue to titrate appropriate diuretic dose with recent low Bps. Monitor O2 and BP, she has quite a narrow window to optimize volume and oxygenation at the same time  - repeat sputum cx today  - Continue sildenafil 40 mg  - Continue inhaled iloprost 5 mcg q4h. Please do not mix with other nebulizer solutions  - Plan to transition to Tyvaso nebs on discharge (arranged with [facility] clinic RN). Approval is still pending  - Continue daily weight with strict I/O  - cont daily pro-BNP    Acute on Chronic Hypoxemic Respiratory Failure  COPD/Asthma  Pulmonary Sarcoidosis  Bronchiectasis s/s sarcoid  Soluble IL-2 receptor, Aspergillus Galactomannan Antigen, IgE checked 10/2022 all wnl. PET scan 9/17 does not appear to be active pulmonary sarcoidosis and again it is overall felt to be burned out from sarcoidosis standpoint. Attempted to perform with oxymizer on 9/26 but had a drop in BP to 63/50 after walking several feet and ultimately had a rapid response called. She was able to be assisted to her bed with recovery of BP to 90s/50s and sat of 93% on 7L. Received a 1x bolus of of LR.  Per RT note on 9/28, pt began struggling with breathing and desatting to 70s. Was started on 12L large bore nasal cannula and transitioned to HFNC 40L and  60% FiO2 satting at 97-98%. On 9/30 was able to deescalate back to large bore nasal cannula and is currently on 10L satting at 91% with appropriate airway clearance. It is likely she has higher O2 requirement chronically than the amount she has been using at home. This may reflect her new baseline and this was discussed with the patient.     Plan:  - Repeat lower respiratory culture  - continue home pulmicort, brovana, PRN albuterol, azithromycin  - please cont airway clearance:   - duonebs TID and HTS (3%) BID with [place]  - wean oxygen as tolerated    Chronic Pulmonary Aspergillosis   Aspergillus Galactomannan Antigen, IgE checked 10/2022 all wnl.   - continue home posaconazole   - pt has outpt evaluation for [facility] scheduled for 10/25 for history of recurrent hemoptysis    Karen Barber was seen, examined and discussed with  Dr. Nada Libman  Thank you for involving [place] in her care. We look forward to following with you.  Recommendations were communicated to the primary team at 10:14 AM  Please page [place] with any questions or concerns at [phone] (pulmonary consult fellow).      Karen Barber  Medical Student    Karen Banda, MD  Pulmonary Critical Care Fellow      HISTORY:     Interval Hx:   - remains on HFNC, 40% and 40L  - net negative 780 cc    History of Present Illness:  Karen Barber is a 57 y.o. female with hx pf pulmonary sarcoidosis, COPD, PAH (baseline home O2 6L), chronic pulmonary aspergillosis, s/p LUL lobectomy 2016 who initially presented to the hospital for a PET scan to evaluate her sarcoidosis.     Pt was found to have acute on chronic respiratory failure (sat in 70s on home 6L) in the setting of not taking her home medications (sildenafil, lasix, nebs, inhalers) in preparation for PET scan. As her respiratory status did not improve with NRB, placed on HFNC. 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 She received one dose of 125mg  of IV methylprednisolone and then was transferred to the MICU for further management of her hypoxia. Pt had a week long stay in the MICU and was weaned from HFNC to [facility] after IV diuresis and course of antibiotics (completed course of levaquin) for possible pneumonia. Pt has been continuing home airway clearance (Aerobika and HTS neb), and home pulmicort, brovana, atrovent, and PRN albuterol. Showing significant signs of improvement but still having desaturations with ambulation.     Pt started iloprost 9/19 and has tolerated it well so far. RHC 9/20 demonstrated severe pulmonary hypertension, markedly elevated PVR (9.3-10.5) and low cardiac output/index. HRCT chest showed unchanged fibrotic pulmonary sarcoidosis with bilateral segmental and subsegmental fibrosis post left upper lobectomy with unchanged apical right upper lobe and superior segment left lower lobe bullae.     Of note, has been hospitalized twice prior to this in the past 6 weeks.  8/18-8/25: presented with increased O2 requirement, increased swelling, brown sputum production.   - treated for serratia pneumonia (completed levaquin)  - Was discharged on AVAPS for home, as well as with HTS/albuterol and [place].  10/21/22-10/25/22: presented with DOE, up to 8-10L with exertion and desats to 70%.   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 A.niger from sputum 08/2016-09/2016.    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    Sarcoidosis 1995    Type 2 diabetes mellitus with hyperglycemia (CMS-HCC) 07/27/2014    Visual impairment     glasses     Past Surgical History:   Procedure Laterality Date    LUNG LOBECTOMY      PR AMPUTATION TOE,MT-P JT Right 12/12/2019    Procedure: Right foot fifth toe amputation at the metatarsophalangeal joint level;  Surgeon: Britt Bottom, DPM; Location: MAIN OR [facility];  Service: Vascular    PR AMPUTATION TOE,MT-P JT Right 06/16/2020    Procedure: Right foot amputation of 3rd toe at mpj level;  Surgeon: Britt Bottom, DPM;  Location: MAIN OR [facility];  Service: Vascular    PR RIGHT HEART CATH O2 SATURATION & CARDIAC OUTPUT N/A 04/29/2019    Procedure: Right Heart Catheterization;  Surgeon: Rosana Hoes, MD;  Location: [facility] CATH;  Service: Cardiology    PR RIGHT HEART CATH O2 SATURATION & CARDIAC OUTPUT N/A 12/08/2022    Procedure: Right Heart Catheterization;  Surgeon: Neal Dy, MD;  Location: [facility] CATH;  Service: Cardiology    PR THORACOSCOPY SURG TOT PULM DECORT Left 12/14/2014    Procedure: THORACOSCOPY SURG; W/TOT PULM DECORTIC/PNEUMOLYS;  Surgeon: Evert Kohl, MD;  Location: MAIN OR [facility];  Service: Cardiothoracic    PR THORACOSCOPY W/THERA WEDGE RESEXN INITIAL UNILAT Left 12/03/2014    Procedure: THORACOSCOPY, SURGICAL; WITH THERAPEUTIC WEDGE RESECTION (EG, MASS, NODULE) INITIAL UNILATERAL;  Surgeon: Evert Kohl, MD;  Location: MAIN OR [facility];  Service: Cardiothoracic       Other History:  Family History   Problem Relation Age of Onset    Dementia Mother     Diabetes Father     Diabetes Brother     Diabetes Maternal Aunt     Sarcoidosis Maternal Aunt     Diabetes Maternal Uncle     Diabetes Paternal Aunt     Diabetes Paternal Uncle      Social History     Socioeconomic History    Marital status: Single   Tobacco Use    Smoking status: Former     Current packs/day: 0.00     Average packs/day: 0.5 packs/day for 9.0 years (4.5 ttl pk-yrs)     Types: Cigarettes     Start date: 05/02/1978     Quit date: 05/03/1987     Years since quitting: 35.6    Smokeless tobacco: Never   Vaping Use    Vaping status: Never Used   Substance and Sexual Activity    Alcohol use: No     Alcohol/week: 0.0 standard drinks of alcohol    Drug use: No    Sexual activity: Yes   Other Topics Concern    Do you use  sunscreen? No    Tanning bed use? No    Are you easily burned? No    Excessive sun exposure? No    Blistering sunburns? No     Social Determinants of Health     Financial Resource Strain: Low Risk  (11/06/2022)    Overall Financial Resource Strain (CARDIA)     Difficulty of Paying Living Expenses: Not hard at all   Food Insecurity: No Food Insecurity (11/06/2022)    Hunger Vital Sign     Worried About Running Out of Food in the Last Year: Never true     Ran Out of Food in the Last Year: Never true   Transportation Needs: No Transportation Needs (11/06/2022)    PRAPARE - Therapist, art (Medical): No     Lack of Transportation (Non-Medical): No       Home Medications:  No current facility-administered medications on file prior to encounter.     Current Outpatient Medications on File Prior to Encounter   Medication Sig Dispense Refill    ACCU-CHEK GUIDE GLUCOSE METER Misc 1 Device by Other route Four (4) times a day (before meals and nightly). AS DIRECTED 1 kit 0    albuterol 2.5 mg /3 mL (0.083 %) nebulizer solution Inhale 3 mL (2.5 mg total) by nebulization every six (6) hours as needed for wheezing or shortness of breath (airway clearance/cough). 360 mL 3    albuterol HFA 90 mcg/actuation inhaler Inhale 2 puffs every four (4) hours as needed for wheezing or shortness of breath. 54 g 0    arformoterol (BROVANA) 15 mcg/2 mL Inhale 2 mL (15 mcg total) by nebulization in the morning and 2 mL (15 mcg total) in the evening. 120 mL 2    aspirin-caffeine (BAYER BACK AND BODY) 500-32.5 mg Tab Take by mouth. Takes 2 1/2 pills 3-4 times a day as needed.      azithromycin (ZITHROMAX) 250 MG tablet Take 1 tablet (250 mg total) by mouth daily. Start on 9/1 after completing the levofloxacin 90 tablet 0    blood sugar diagnostic Strp by Other route Three (3) times a day. ACCU-CHEK Guide meter 300 strip 1    budesonide (PULMICORT) 0.5 mg/2 mL nebulizer solution Inhale 2 mL (0.5 mg total) by nebulization in the morning and 2 mL (0.5 mg total) in the evening. 120 mL 2    furosemide (LASIX) 80 MG tablet Take 1 tablet (80 mg total) by mouth two (2) times a day. Take a third dose as needed for increasing weight or increasing swelling. 60 tablet 0    gabapentin (NEURONTIN) 300 MG capsule Take 1 capsule (300 mg total) by mouth Three (3) times a day. 270 capsule 3    insulin aspart (NOVOLOG U-100 INSULIN ASPART) 100 unit/mL vial Inject 0.01-0.2 mL (1-20 Units total) under the skin Four (4) times a day (before meals and nightly). SLIDING SCALE 20 mL 1    insulin glargine (BASAGLAR KWIKPEN U-100 INSULIN) 100 unit/mL (3 mL) injection pen Inject 0.18 mL (18 Units total) under the skin nightly. 15 mL 0    ipratropium (ATROVENT) 0.02 % nebulizer solution Inhale 2.5 mL (500 mcg total) by nebulization four (4) times a day. 300 mL 0    posaconazole (NOXAFIL) 100 mg delayed released tablet Take 2 tablets by mouth once daily 60 tablet 0    sildenafiL, pulm.hypertension, (REVATIO) 20 mg tablet Take 1 tablet (20 mg total) by mouth Three (3) times a day.  90 tablet 0    OXYGEN-AIR DELIVERY SYSTEMS MISC Dose: 2-3 LPM         In-Hospital Medications:  Scheduled:   arformoterol  15 mcg Nebulization BID (RT)    azithromycin  250 mg Oral Daily    budesonide  0.5 mg Nebulization BID (RT)    diclofenac sodium  2 g Topical QID    enoxaparin (LOVENOX) injection  40 mg Subcutaneous Nightly    flu vacc [id number] 6mos up(PF)  0.5 mL Intramuscular During hospitalization    [Provider Hold] furosemide  80 mg Oral BID    gabapentin  300 mg Oral TID    iloprost  5 mcg Nebulization 6XD    insulin glargine  12 Units Subcutaneous Nightly    insulin lispro  0-20 Units Subcutaneous ACHS    insulin lispro  3 Units Subcutaneous 3xd Meals    ipratropium-albuterol  3 mL Nebulization TID (RT)    lidocaine  1 patch Transdermal Daily    magnesium oxide-Mg AA chelate  2 tablet Oral BID    polyethylene glycol  17 g Oral Daily    posaconazole  200 mg Oral Daily    sildenafiL (pulm.hypertension)  40 mg Oral TID    sodium chloride  4 mL Nebulization BID (RT)         PRN Medications:  acetaminophen, albuterol, calcium carbonate, dextrose in water, glucagon, melatonin, [EXPIRED] Implement **AND** [EXPIRED] Care order/instruction **AND** [EXPIRED] Vital signs **AND** Adult Oxygen therapy **AND** [EXPIRED] sodium chloride **AND** [EXPIRED] sodium chloride 0.9% **AND** [EXPIRED] diphenhydrAMINE **AND** [EXPIRED] famotidine **AND** methylPREDNISolone sodium succinate **AND** [EXPIRED] EPINEPHrine, ondansetron **OR** ondansetron, senna, sodium chloride    Allergies:  Allergies as of 11/29/2022    (No Known Allergies)         PHYSICAL EXAM:   BP 108/82  - Pulse 87  - Temp 36.2 ??C (97.2 ??F) (Oral)  - Resp 24  - Ht 180.3 cm (5' 11)  - Wt 86.7 kg (191 lb 3.2 oz)  - LMP  (LMP Unknown)  - SpO2 91%  - BMI 26.67 kg/m??   General: Alert, well-appearing, and in no distress.  Eyes: Anicteric sclera, conjunctiva clear.  ENT:   no drainage.  Cardiovascular: Regular rate and rhythm.  Pulmonary: Diminished lung sounds in bilateral upper lobes, crackles bilateral bases, occassional rhonchi  Musculoskeletal: No clubbing.  Skin: No rashes or lesions.,    Neuro: No focal neurological deficits.    LABORATORY and RADIOLOGY DATA:     Pertinent Laboratory Data:  Lab Results   Component Value Date    WBC 3.1 (L) 12/18/2022    HGB 10.3 (L) 12/18/2022    HCT 31.6 (L) 12/18/2022    PLT 151 12/18/2022       Lab Results   Component Value Date    NA 139 12/18/2022    K 4.3 12/18/2022    CL 104 12/18/2022    CO2 34.0 (H) 12/18/2022    BUN 36 (H) 12/18/2022    CREATININE 1.54 (H) 12/18/2022    GLU 131 12/18/2022    CALCIUM 9.2 12/18/2022    MG 2.1 12/14/2022    PHOS 5.4 (H) 12/16/2022       Lab Results   Component Value Date    BILITOT 0.7 12/14/2022    BILIDIR <0.10 05/30/2019    PROT 6.9 12/14/2022    ALBUMIN 3.5 12/16/2022    ALT 11 12/14/2022    AST 15 12/14/2022    ALKPHOS 70 12/14/2022  GGT 60 (H) 05/31/2011       Lab Results   Component Value Date INR 1.02 12/11/2019    APTT 30.9 12/11/2019       Pertinent Micro Data:  Microbiology Results (last day)       ** No results found for the last 24 hours. **             Pertinent Imaging Data:  9/11 CXR   - Extensive fibrotic changes and biapical bulla appear grossly unchanged. Extensive pulmonary fibrosis limits evaluation for superimposed infection.     9/17 Echo    Summary    1. Limited study.    2. The left ventricle is normal in size with normal wall thickness.    3. The left ventricular systolic function is normal, LVEF is visually  estimated at > 55%.    4. The right ventricle is severely dilated in size, with mildly reduced  systolic function.    5. There is severe pulmonary hypertension.    6. TR maximum velocity: 4.4 m/s  Estimated PASP: 86 mmHg.    7. IVC size and inspiratory change suggest mildly elevated right atrial  pressure. (5-10 mmHg).    8. No significant change since prior study from 10/23/2022    9/18 PET CT  Impression   Bulky, partially calcified lymph nodes in the mediastinum and bilateral hilar with no significant to mild uptake, decreased in intensity since prior.   Grossly unchanged metabolically active right supraclavicular lymph nodes, accounting for differences in positioning of the arms and lack of IV contrast.   Bronchiectasis and partially metabolically active reticular opacities are seen bilaterally, most prominent in the lower lobes, overall similar to prior.   Dilated main pulmonary artery measuring up to 4.0 cm, suggestive of pulmonary arterial hypertension.     9/20 RHC  FINAL CARDIAC CATHETERIZATION REPORT    CONCLUSIONS:  - Severe pulmonary Hypertension  - Markedly elevated PVR (9.3 - 10.5)  - Low cardiac output / index     9/25 CXR  No significant interval change since the prior chest radiograph.     9/25 CT Chest High Resolution w/o contrast  Unchanged fibrotic pulmonary sarcoidosis with bilateral segmental and subsegmental fibrosis post left upper lobectomy with unchanged apical right upper lobe and superior segment left lower lobe bullae.     9/27 CXR  Fibrotic interstitial lung disease with biapical large bullae which are better characterized on the prior CT chest. No significant interval changes.     9/28 CXR  Fibrotic interstitial lung disease with biapical large bullae which are better characterized on the prior CT chest. No significant interval changes.

## 2022-12-18 NOTE — Unmapped (Signed)
Infectious Disease ([facility]) Progress Note    Assessment & Plan:   Karen Barber is a 57 y.o. female whose presentation is complicated by bronchiectasis type 1 in the setting of pulmonary sarcoidosis (6L [place] home O2), HTN, T2DM, COPD, s/p LUL lobectomy, and asthma that presented to [facility] admitted to MICU after becoming hypoxic after holding home meds prior to PET scan. After 1 week diuresing ICU stabilized patient she was transferred to the MPCU.    Principal Problem:    Acute on chronic hypoxic respiratory failure (CMS-HCC)  Active Problems:    Pulmonary sarcoidosis (CMS-HCC)    Type 2 diabetes mellitus with hyperglycemia (CMS-HCC)    COPD (chronic obstructive pulmonary disease) (CMS-HCC)    Chronic pulmonary aspergillosis (CMS-HCC)    Pulmonary hypertension (CMS-HCC)    AKI (acute kidney injury) (CMS-HCC)    Active Problems  #Acute on Chronic Hypoxemic Respiratory Failure -   #Pulmonary Sarcoidosis -   #COPD/asthma -   #Bronchiectasis  Patient has notable history of pulmonary sarcoidosis, COPD, pulmonary hypertension and is on baseline oxygen 6 L.  Recent admission with pneumonia growing Serratia marcescens  s/p 1x Rocephin (8/18), Vancomycin (8/18-8/19), Zosyn (8/18-8/23) discharged on levofloxacin 500 mg ending on 8/31.  Patient held her medications only for the morning prior to an outpatient PET CT scan during which she became hypoxic to the 70s and was admitted to the MICU.  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 She received one dose of 125mg  of IV methylprednisolone. She was weaned from high flow nasal cannula to large bore nasal cannula after IV diuresis and a course of antibiotics for possible pneumonia with Vanco/cefepime later transition to levofloxacin with a total 1 week treatment with antibiotics. Started on vanc/cefepime for broad coverage --> stopped vanc and cefepime 9/12- then levaquin (9/12---9/16).  Patient was transferred to the floor on 9/18 after showing significant signs of improvement but still having occasional desaturations with ambulation. PET scan 9/18 is not indicative of active pulmonary sarcoidosis.  Acute exacerbation likely multifactorial including in not taking her home medications as per directed for airway clearance, volume overload, and higher O2 requirement chronically than the amount she has been using. RHC 9/20 showing significant pulm HTN disease with mean PA pressure of 47, for which pulmonology team started Iloprost. Notably, wedge pressure on that study was 5, which is reassuring against volume as driver of her respiratory status at that time. Patient with increasing O2 requirements and now requiring HFNC after saturating to mid-80s even with 8L and Venturi mask. Possibly patient is volume overloaded from holding Lasix, however only +1 pitting edema noted on physical exam that has been stable. Will trial of IV Lasix 40 to assess response. CXR unchanged from prior CT on 9/25  -- plan to get oxymizer for her while admitted. Will need oxymizer at home  -- Please try large bore 8L if satting above 95% with HFNC   -- pulmonology, appreciate recommendations  -- Continue home airway clearance (DuoNebs and HTS 3 times daily with [place])  -- Continue home Pulmicort, Brovana, Atrovent, as needed albuterol, and azithromycin  -- Give Lasix PO 80 mg ONCE daily on 9/30 AM and reassess in PM for second dose  -- PT and OT consult     #Pulmonary hypertension - RV failure - orthostatic hypotension  Patient has history of pulmonary hypertension due to pulmonary sarcoidosis/COPD.  TTE this admission 9/17 with severely dilated right atrium with severe pulmonary hypertension PASP 86 mmHg.  Estimated right atrial  pressure 5 to 10 mmHg.  On Lasix 80 mg twice daily at home.  Echo showed LVEF greater than 55% and severe pulmonary hypertension no significant change since prior study.  Right heart cath showed severe pulmonary Hypertension, markedly elevated PVR (9.3 - 10.5) and low cardiac output / index. HRCT scan shows unchanged fibrotic pulmonary sarcoidosis with bilateral segmental and subsegmental fibrosis post left upper lobectomy with unchanged apical right upper lobe and superior segment left lower lobe bullae.   -- Continue inhaled iloprost 5 mcg 6 times a day q4h; please do not mix with other nebulizer solutions   -- Resuming Lasix 80 mg BID on 9/29, continue home Sildenafil 40mg  tid  -- trend pro BNP, strict I/Os, Daily weights  -- Plan to transition to Tyvaso nebs on discharge. Approval pending   -- [facility] rep to do teaching prior to DC    #Acute kidney injury on CKD3 (downtrending)  On admission patient's creatinine 1.31 which appears to be baseline.  On 9/19 patient was found to have an AKI by definition of > 0.3 increase in creatinine in 48 hours.  -- Check lactate f/up   -- Renally dose medications  -- Daily BMP    #Secondary Hyperparathyroidism, Hypocalcemia, and Hyperphosphatemia  Findings are consistent with secondary hyperparathyroidism due to CKD.  Ionized calcium: 4.37 mg/dL (low)  Phosphate: 5.6 mg/dL (elevated)  PTH: 161.0 pg/mL (elevated)  Vitamin D Total (25OH) 35  -- Reconsider need for future oral calcium carbonate 1600 mg TID  -- Reconsider need for future phoslo 667 mg PO TID   -- Reassess PTH levels in 4-6 weeks.    #Type 2 diabetes mellitus with hyperglycemia and hypoglycemia  Hemoglobin A1c 8.3%.  Home regimen includes glargine 18 units nightly and sliding scale insulin.  Large fluctuations in glucose greater than 300 and occasional values ~50.  -- Sliding scale insulin insulin sensitivity factor 40  --12 glargine nightly  -- 3 units lispro tid     #Left leg swelling (resolved )  Patient endorses intermittent left > right leg swelling. Venous doppler unremarkable.      Chronic Problems     #Chronic Pain   - Continuing home gabapentin   - PT/OT consulted, appreciate recs     #Chronic pulmonary aspergillosis  Patient has history of aspergilloma and is on chronic posaconazole. Soluble IL-2 receptor, Aspergillus Galactomannan Antigen, IgE checked 10/2022 all wnl  -Continue home posaconazole Ppx  -Outpatient evaluation for [facility] scheduled for 10/25 to evaluate recurrent hemoptysis     #Iron deficiency anemia  #Chronic normocytic anemia (baseline hemoglobin 11)  Patient has some mild normocytic anemia with hemoglobin of 11. Iron panel shows iron of 37 with iron saturation of 15.   --Iron supplementation with iron dextran 1,000mg  in 250cc    #Hyperlipidemia  Last lipid profile showed an LDL of 146  -- Follow-up outpatient    Issues Impacting Complexity of Management:  -The patient is at high risk for the development of complications of volume overload due to the need to provide IV hydration for suspected hypovolemia in the setting of: Pulmonary hypertension  -Need for the following intensive monitoring parameter(s) due to high risk of clinical decline: continuous oxygen monitoring, telemetry, and frequent monitoring of urine output to assess for the efficacy of diuretic regimen  -Need for intensive oxygen therapy of HFNC, which places the patient at high risk for oxygen toxicity  -The patient is at high risk of complications from pulmonary hypertension    Daily Checklist:  Diet: Regular Diet   DVT PPx: Lovenox 40mg  q24h  Electrolytes: Replete Potassium to >/= 3.6 and Magnesium to >/= 1.8  Code Status: DNR and DNI  Dispo:  Continue MPCU    Team Contact Information:   Primary Team: Infectious Disease ([facility])  Primary Resident: Conception Chancy, MD  Resident's Pager: [phone] (Infect Disease Intern - [facility])    Interval History:   No acute events overnight.   Lung pain reported - gave voltaren gel, lower extremity swelling L>R    ROS negative except for where otherwise noted above.    Objective:   Temp:  [36.3 ??C (97.3 ??F)-36.8 ??C (98.3 ??F)] 36.6 ??C (97.8 ??F)  Heart Rate:  [74-92] 74  SpO2 Pulse:  [74-91] 74  Resp:  [18-31] 21  BP: (82-103)/(58-68) 90/64  FiO2 (%):  [40 %-50 %] 40 %  SpO2:  [91 %-97 %] 91 %,   Intake/Output Summary (Last 24 hours) at 12/18/2022 0656  Last data filed at 12/18/2022 0330  Gross per 24 hour   Intake 120 ml   Output 900 ml   Net -780 ml   , FiO2 (%):  [40 %-50 %] 40 %  O2 Device: Heated high flow nasal cannula  O2 Flow Rate (L/min):  [50 L/min] 50 L/min    Gen: NAD, converses, appears comfortable on large bore nasal cannula  HENT: atraumatic, normocephalic  Heart: RRR  Lungs: CTAB, no crackles or wheezes, reduced breath sounds LUL   Abdomen: soft, NTND  Extremities: no edema     Courtni Balash Raelyn Mora, MD   [facility] Internal Medicine PGY-1

## 2022-12-19 LAB — BASIC METABOLIC PANEL
ANION GAP: 6 mmol/L (ref 5–14)
ANION GAP: 5 mmol/L (ref 5–14)
BLOOD UREA NITROGEN: 34 mg/dL — ABNORMAL HIGH (ref 9–23)
BLOOD UREA NITROGEN: 33 mg/dL — ABNORMAL HIGH (ref 9–23)
BUN / CREAT RATIO: 21
BUN / CREAT RATIO: 20
CALCIUM: 9.9 mg/dL (ref 8.7–10.4)
CALCIUM: 9.7 mg/dL (ref 8.7–10.4)
CHLORIDE: 101 mmol/L (ref 98–107)
CHLORIDE: 100 mmol/L (ref 98–107)
CO2: 33 mmol/L — ABNORMAL HIGH (ref 20.0–31.0)
CO2: 34 mmol/L — ABNORMAL HIGH (ref 20.0–31.0)
CREATININE: 1.62 mg/dL — ABNORMAL HIGH
CREATININE: 1.67 mg/dL — ABNORMAL HIGH
EGFR CKD-EPI (2021) FEMALE: 36 mL/min/{1.73_m2} — ABNORMAL LOW (ref >=60)
EGFR CKD-EPI (2021) FEMALE: 35 mL/min/{1.73_m2} — ABNORMAL LOW (ref >=60)
GLUCOSE RANDOM: 148 mg/dL (ref 70–179)
GLUCOSE RANDOM: 187 mg/dL — ABNORMAL HIGH (ref 70–179)
POTASSIUM: 4.9 mmol/L — ABNORMAL HIGH (ref 3.4–4.8)
POTASSIUM: 4.3 mmol/L (ref 3.4–4.8)
SODIUM: 140 mmol/L (ref 135–145)
SODIUM: 139 mmol/L (ref 135–145)

## 2022-12-19 LAB — CBC
HEMATOCRIT: 32.4 % — ABNORMAL LOW (ref 34.0–44.0)
HEMOGLOBIN: 10.7 g/dL — ABNORMAL LOW (ref 11.3–14.9)
MEAN CORPUSCULAR HEMOGLOBIN CONC: 33 g/dL (ref 32.0–36.0)
MEAN CORPUSCULAR HEMOGLOBIN: 27.5 pg (ref 25.9–32.4)
MEAN CORPUSCULAR VOLUME: 83.4 fL (ref 77.6–95.7)
MEAN PLATELET VOLUME: 8.7 fL (ref 6.8–10.7)
PLATELET COUNT: 150 10*9/L (ref 150–450)
RED BLOOD CELL COUNT: 3.89 10*12/L — ABNORMAL LOW (ref 3.95–5.13)
RED CELL DISTRIBUTION WIDTH: 17.6 % — ABNORMAL HIGH (ref 12.2–15.2)
WBC ADJUSTED: 3.3 10*9/L — ABNORMAL LOW (ref 3.6–11.2)

## 2022-12-19 LAB — PRO-BNP: PRO-BNP: 3276 pg/mL — ABNORMAL HIGH (ref <=300.0)

## 2022-12-19 LAB — MAGNESIUM: MAGNESIUM: 2.1 mg/dL (ref 1.6–2.6)

## 2022-12-19 LAB — LACTATE, VENOUS, WHOLE BLOOD: LACTATE BLOOD VENOUS: 1.8 mmol/L (ref 0.5–1.8)

## 2022-12-19 MED ADMIN — insulin glargine (LANTUS) injection 12 Units: 12 [IU] | SUBCUTANEOUS

## 2022-12-19 MED ADMIN — iloprost (VENTAVIS) nebulizer solution: 5 ug | RESPIRATORY_TRACT | @ 04:00:00

## 2022-12-19 MED ADMIN — sodium chloride 3 % NEBULIZER solution 4 mL: 4 mL | RESPIRATORY_TRACT | @ 01:00:00

## 2022-12-19 MED ADMIN — diclofenac sodium (VOLTAREN) 1 % gel 2 g: 2 g | TOPICAL | @ 21:00:00

## 2022-12-19 MED ADMIN — lidocaine (ASPERCREME) 4 % 1 patch: 1 | TRANSDERMAL | @ 13:00:00

## 2022-12-19 MED ADMIN — furosemide (LASIX) tablet 80 mg: 80 mg | ORAL | @ 21:00:00 | Stop: 2022-12-19

## 2022-12-19 MED ADMIN — diclofenac sodium (VOLTAREN) 1 % gel 2 g: 2 g | TOPICAL | @ 01:00:00

## 2022-12-19 MED ADMIN — insulin lispro (HumaLOG) injection 0-20 Units: 0-20 [IU] | SUBCUTANEOUS

## 2022-12-19 MED ADMIN — ipratropium-albuterol (DUO-NEB) 0.5-2.5 mg/3 mL nebulizer solution 3 mL: 3 mL | RESPIRATORY_TRACT | @ 19:00:00

## 2022-12-19 MED ADMIN — sodium chloride 3 % NEBULIZER solution 4 mL: 4 mL | RESPIRATORY_TRACT | @ 13:00:00

## 2022-12-19 MED ADMIN — iloprost (VENTAVIS) nebulizer solution: 5 ug | RESPIRATORY_TRACT | @ 13:00:00

## 2022-12-19 MED ADMIN — sildenafiL (pulm.hypertension) (REVATIO) tablet 40 mg: 40 mg | ORAL

## 2022-12-19 MED ADMIN — arformoterol (BROVANA) nebulizer solution 15 mcg/2 mL: 15 ug | RESPIRATORY_TRACT | @ 01:00:00

## 2022-12-19 MED ADMIN — azithromycin (ZITHROMAX) tablet 250 mg: 250 mg | ORAL | @ 13:00:00 | Stop: 2025-08-25

## 2022-12-19 MED ADMIN — magnesium oxide-Mg AA chelate (Magnesium Plus Protein) 2 tablet: 2 | ORAL

## 2022-12-19 MED ADMIN — gabapentin (NEURONTIN) capsule 300 mg: 300 mg | ORAL | @ 19:00:00

## 2022-12-19 MED ADMIN — ipratropium-albuterol (DUO-NEB) 0.5-2.5 mg/3 mL nebulizer solution 3 mL: 3 mL | RESPIRATORY_TRACT | @ 01:00:00

## 2022-12-19 MED ADMIN — iloprost (VENTAVIS) nebulizer solution: 5 ug | RESPIRATORY_TRACT | @ 01:00:00

## 2022-12-19 MED ADMIN — gabapentin (NEURONTIN) capsule 300 mg: 300 mg | ORAL | @ 13:00:00

## 2022-12-19 MED ADMIN — furosemide (LASIX) tablet 80 mg: 80 mg | ORAL | @ 15:00:00 | Stop: 2022-12-19

## 2022-12-19 MED ADMIN — budesonide (PULMICORT) nebulizer solution 0.5 mg: .5 mg | RESPIRATORY_TRACT | @ 13:00:00

## 2022-12-19 MED ADMIN — iloprost (VENTAVIS) nebulizer solution: 5 ug | RESPIRATORY_TRACT | @ 22:00:00

## 2022-12-19 MED ADMIN — iloprost (VENTAVIS) nebulizer solution: 5 ug | RESPIRATORY_TRACT | @ 19:00:00

## 2022-12-19 MED ADMIN — magnesium oxide-Mg AA chelate (Magnesium Plus Protein) 2 tablet: 2 | ORAL | @ 13:00:00

## 2022-12-19 MED ADMIN — sildenafiL (pulm.hypertension) (REVATIO) tablet 40 mg: 40 mg | ORAL | @ 13:00:00

## 2022-12-19 MED ADMIN — ipratropium-albuterol (DUO-NEB) 0.5-2.5 mg/3 mL nebulizer solution 3 mL: 3 mL | RESPIRATORY_TRACT | @ 13:00:00

## 2022-12-19 MED ADMIN — iloprost (VENTAVIS) nebulizer solution: 5 ug | RESPIRATORY_TRACT | @ 17:00:00

## 2022-12-19 MED ADMIN — insulin lispro (HumaLOG) injection 3 Units: 3 [IU] | SUBCUTANEOUS | @ 15:00:00

## 2022-12-19 MED ADMIN — melatonin tablet 6 mg: 6 mg | ORAL

## 2022-12-19 MED ADMIN — arformoterol (BROVANA) nebulizer solution 15 mcg/2 mL: 15 ug | RESPIRATORY_TRACT | @ 13:00:00

## 2022-12-19 MED ADMIN — posaconazole (NOXAFIL) delayed released tablet 200 mg: 200 mg | ORAL | @ 13:00:00

## 2022-12-19 MED ADMIN — sildenafiL (pulm.hypertension) (REVATIO) tablet 40 mg: 40 mg | ORAL | @ 19:00:00

## 2022-12-19 MED ADMIN — gabapentin (NEURONTIN) capsule 300 mg: 300 mg | ORAL

## 2022-12-19 MED ADMIN — enoxaparin (LOVENOX) syringe 40 mg: 40 mg | SUBCUTANEOUS

## 2022-12-19 MED ADMIN — budesonide (PULMICORT) nebulizer solution 0.5 mg: .5 mg | RESPIRATORY_TRACT | @ 01:00:00

## 2022-12-19 MED ADMIN — insulin lispro (HumaLOG) injection 3 Units: 3 [IU] | SUBCUTANEOUS | @ 21:00:00

## 2022-12-19 NOTE — Unmapped (Signed)
Additional order received and the patient is already on the caseload. Continue with plan of care.

## 2022-12-19 NOTE — Unmapped (Signed)
Infectious Disease ([facility]) Progress Note    Assessment & Plan:   Karen Barber is a 57 y.o. female whose presentation is complicated by bronchiectasis type 1 in the setting of pulmonary sarcoidosis (6L [place] home O2), HTN, T2DM, COPD, s/p LUL lobectomy, and asthma that presented to [facility] admitted to MICU after becoming hypoxic after holding home meds prior to PET scan. After 1 week diuresing ICU stabilized patient she was transferred to the MPCU.    Principal Problem:    Acute on chronic hypoxic respiratory failure (CMS-HCC)  Active Problems:    Pulmonary sarcoidosis (CMS-HCC)    Type 2 diabetes mellitus with hyperglycemia (CMS-HCC)    COPD (chronic obstructive pulmonary disease) (CMS-HCC)    Chronic pulmonary aspergillosis (CMS-HCC)    Pulmonary hypertension (CMS-HCC)    AKI (acute kidney injury) (CMS-HCC)    Active Problems  #Acute on Chronic Hypoxemic Respiratory Failure -   #Pulmonary Sarcoidosis -   #COPD/asthma -   #Bronchiectasis  Patient has notable history of pulmonary sarcoidosis, COPD, pulmonary hypertension and is on baseline oxygen 6 L.  Recent admission with pneumonia growing Serratia marcescens  s/p 1x Rocephin (8/18), Vancomycin (8/18-8/19), Zosyn (8/18-8/23) discharged on levofloxacin 500 mg ending on 8/31.  Patient held her medications only for the morning prior to an outpatient PET CT scan during which she became hypoxic to the 70s and was admitted to the MICU.  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 She received one dose of 125mg  of IV methylprednisolone. She was weaned from high flow nasal cannula to large bore nasal cannula after IV diuresis and a course of antibiotics for possible pneumonia with Vanco/cefepime later transition to levofloxacin with a total 1 week treatment with antibiotics. Started on vanc/cefepime for broad coverage --> stopped vanc and cefepime 9/12- then levaquin (9/12---9/16).  Patient was transferred to the floor on 9/18 after showing significant signs of improvement but still having occasional desaturations with ambulation. PET scan 9/18 is not indicative of active pulmonary sarcoidosis.  Acute exacerbation likely multifactorial including in not taking her home medications as per directed for airway clearance, volume overload, and higher O2 requirement chronically than the amount she has been using. RHC 9/20 showing significant pulm HTN disease with mean PA pressure of 47, for which pulmonology team started Iloprost. Notably, wedge pressure on that study was 5, which is reassuring against volume as driver of her respiratory status at that time. Patient with increasing O2 requirements and now requiring HFNC after saturating to mid-80s even with 8L and Venturi mask. Possibly patient is volume overloaded from holding Lasix, however only +1 pitting edema noted on physical exam that has been stable. Trailing Lasix.  -- Continue to wean off O2 with goal saturations of 90-94% per pulmonology  -- plan to get oxymizer for her while admitted. Will need oxymizer at home  -- pulmonology, appreciate recommendations  -- Continue home airway clearance (DuoNebs and HTS 3 times daily with [place])  -- Continue home Pulmicort, Brovana, Atrovent, as needed albuterol, and azithromycin  -- AM Lasix 80 PO dose with plan for redose in PM pending IOs  -- PT and OT consult     #Pulmonary hypertension - RV failure - orthostatic hypotension  Patient has history of pulmonary hypertension due to pulmonary sarcoidosis/COPD.  TTE this admission 9/17 with severely dilated right atrium with severe pulmonary hypertension PASP 86 mmHg.  Estimated right atrial pressure 5 to 10 mmHg.  On Lasix 80 mg twice daily at home.  Echo showed LVEF  greater than 55% and severe pulmonary hypertension no significant change since prior study.  Right heart cath showed severe pulmonary Hypertension, markedly elevated PVR (9.3 - 10.5) and low cardiac output / index. HRCT scan shows unchanged fibrotic pulmonary sarcoidosis with bilateral segmental and subsegmental fibrosis post left upper lobectomy with unchanged apical right upper lobe and superior segment left lower lobe bullae.   -- Continue inhaled iloprost 5 mcg 6 times a day q4h; please do not mix with other nebulizer solutions   -- trend pro BNP, strict I/Os, Daily weights  -- Plan to transition to Tyvaso nebs on discharge. Approval pending   -- [facility] rep to do teaching prior to DC    #Acute kidney injury on CKD3 (downtrending)  On admission patient's creatinine 1.31 which appears to be baseline.  On 9/19 patient was found to have an AKI by definition of > 0.3 increase in creatinine in 48 hours.  -- Check lactate f/up   -- Renally dose medications  -- Daily BMP    #Secondary Hyperparathyroidism, Hypocalcemia, and Hyperphosphatemia  Findings are consistent with secondary hyperparathyroidism due to CKD.  Ionized calcium: 4.37 mg/dL (low)  Phosphate: 5.6 mg/dL (elevated)  PTH: 130.8 pg/mL (elevated)  Vitamin D Total (25OH) 35  -- Reconsider need for future oral calcium carbonate 1600 mg TID  -- Reconsider need for future phoslo 667 mg PO TID   -- Reassess PTH levels in 4-6 weeks.    #Type 2 diabetes mellitus with hyperglycemia and hypoglycemia  Hemoglobin A1c 8.3%.  Home regimen includes glargine 18 units nightly and sliding scale insulin.  Large fluctuations in glucose greater than 300 and occasional values ~50.  -- Sliding scale insulin insulin sensitivity factor 40  --12 glargine nightly  -- 3 units lispro tid     #Left leg swelling (resolved )  Patient endorses intermittent left > right leg swelling. Venous doppler unremarkable.      Chronic Problems     #Chronic Pain   - Continuing home gabapentin   - PT/OT consulted, appreciate recs     #Chronic pulmonary aspergillosis  Patient has history of aspergilloma and is on chronic posaconazole. Soluble IL-2 receptor, Aspergillus Galactomannan Antigen, IgE checked 10/2022 all wnl  -Continue home posaconazole Ppx  -Outpatient evaluation for [facility] scheduled for 10/25 to evaluate recurrent hemoptysis     #Iron deficiency anemia  #Chronic normocytic anemia (baseline hemoglobin 11)  Patient has some mild normocytic anemia with hemoglobin of 11. Iron panel shows iron of 37 with iron saturation of 15.   --Iron supplementation with iron dextran 1,000mg  in 250cc    #Hyperlipidemia  Last lipid profile showed an LDL of 146  -- Follow-up outpatient    Issues Impacting Complexity of Management:  -The patient is at high risk for the development of complications of volume overload due to the need to provide IV hydration for suspected hypovolemia in the setting of: Pulmonary hypertension  -Need for the following intensive monitoring parameter(s) due to high risk of clinical decline: continuous oxygen monitoring, telemetry, and frequent monitoring of urine output to assess for the efficacy of diuretic regimen  -Need for intensive oxygen therapy of HFNC, which places the patient at high risk for oxygen toxicity  -The patient is at high risk of complications from pulmonary hypertension    Daily Checklist:  Diet: Regular Diet   DVT PPx: Lovenox 40mg  q24h  Electrolytes: Replete Potassium to >/= 3.6 and Magnesium to >/= 1.8  Code Status: DNR and DNI  Dispo:  Continue  MPCU    Team Contact Information:   Primary Team: Infectious Disease ([facility])  Primary Resident: Zella Richer, MD  Resident's Pager: [phone] Evelene Croon Disease Intern - [facility])    Interval History:   No acute events overnight.     No complaints this morning.  Denies any fevers, chills, coughs. Encouraged trialing deep breathings first and only reaching for the non-rebreather if she feels dyspneic with low saturations.  Otherwise, low saturation alarm is likely due to poor reading and recommended deep breathing first/readjusting finger probe before calling nurse.    ROS negative except for where otherwise noted above.    Objective:   Temp:  [35.6 ??C (96 ??F)-36.4 ??C (97.6 ??F)] 35.9 ??C (96.6 ??F)  Heart Rate:  [75-90] 89  SpO2 Pulse:  [78-90] 89  Resp:  [14-29] 22  BP: (102-119)/(52-82) 103/63  SpO2:  [80 %-100 %] 97 %,   Intake/Output Summary (Last 24 hours) at 12/19/2022 1404  Last data filed at 12/19/2022 1200  Gross per 24 hour   Intake 490 ml   Output 1300 ml   Net -810 ml   , O2 Device: Large bore nasal cannula (cool high flow)  O2 Flow Rate (L/min):  [8 L/min-10 L/min] 10 L/min    Gen: NAD, converses, appears comfortable on large bore nasal cannula  HENT: atraumatic, normocephalic  Heart: RRR  Lungs: CTAB, no crackles or wheezes, reduced breath sounds LUL   Abdomen: soft, NTND  Extremities: no edema     Shireen Quan, MD  PGY-1 Resident, [facility] ([facility] Internal Medicine

## 2022-12-19 NOTE — Unmapped (Signed)
Pulmonary Consult Service  Consult Follow Up Note      Primary Service:   Medicine  Primary Service Attending:  Melven Sartorius, MD  Reason for Consult:   Assistance with diuresis    ASSESSMENT and PLAN     Karen Barber is a 57 y.o. 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 Pt was found to have acute on chronic respiratory failure (sat in 70s on home 6L) in the setting of not taking her home medications (sildenafil, lasix, nebs, inhalers) in preparation for PET scan, admitted 9/11. She was admitted to the ICU initially, has since been weaned back to 6-8L [place] with airway clearance and diuresis.     Her acute on chronic hypoxemia and increase O2 requirement was likely multifactorial -- combination of volume overload as well as lack of airway clearance at home, however I think chronically her O2 requirement (related to her PH) is higher than the amount she has been on at home and she continues to have severe exertional hypoxemia. More recently (9/28) she has had increased O2 requirements in the setting of holding diuretics, recommend restarting as below.    RV Failure  PAH (Group 3, 5)  RHC 9/20 demonstrated severe pulmonary hypertension, markedly elevated PVR (9.3-10.5) and low cardiac output/index. HRCT completed for PH-ILD diagnosis and Tyvaso enrollment sent to [facility].   Her O2 requirements at baseline now seem to be stable at 8-10 L [facility].    Plan:  - Continue 80mg  PO lasix  - repeat sputum cx today  - Continue sildenafil 40 mg TID  - Continue inhaled iloprost 5 mcg q4h. Please do not mix with other nebulizer solutions  - Plan to transition to Tyvaso nebs on discharge (arranged with [facility] clinic RN).  - Continue daily weight with strict I/O  - cont daily pro-BNP    Acute on Chronic Hypoxemic Respiratory Failure  COPD/Asthma  Pulmonary Sarcoidosis  Bronchiectasis s/s sarcoid  Soluble IL-2 receptor, Aspergillus Galactomannan Antigen, IgE checked 10/2022 all wnl. PET scan 9/17 does not appear to be active pulmonary sarcoidosis and again it is overall felt to be burned out from sarcoidosis standpoint. She is now stable satting at >95% on 8-10L via [facility]. It is likely she has higher O2 requirement chronically than the amount she has been using at home. This may reflect her new baseline and this was discussed with the patient. There also needs to be a discussion with the patient about more realistic oxygen sats for her and limits on activity at home to prevent desats.     Plan:  - Repeat lower respiratory culture  - Set O2 sat goal at 90-94%, wean oxygen as tolerated  - continue home pulmicort, brovana, PRN albuterol, azithromycin  - please cont airway clearance:   - duonebs TID and HTS (3%) BID with aerobika    Chronic Pulmonary Aspergillosis   Aspergillus Galactomannan Antigen, IgE checked 10/2022 all wnl.   - continue home posaconazole   - pt has outpt evaluation for [facility] scheduled for 10/25 for history of recurrent hemoptysis    Karen Barber was seen, examined and discussed with  Dr. Nada Libman  Thank you for involving [place] in her care. We look forward to following with you.  Recommendations were communicated to the primary team at 10:14  AM  Please page [place] with any questions or concerns at [phone] (pulmonary consult fellow).      Armed forces training and education officer  Medical Student      HISTORY:     Interval Hx:   - now on 8L [facility]NC  - net negative 1.3L on lasix 80mg  PO    History of Present Illness:  Karen Barber is a 57 y.o. female with hx pf pulmonary sarcoidosis, COPD, PAH (baseline home O2 6L), chronic pulmonary aspergillosis, s/p LUL lobectomy 2016 who initially presented to the hospital for a PET scan to evaluate her sarcoidosis.     Pt was found to have acute on chronic respiratory failure (sat in 70s on home 6L) in the setting of not taking her home medications (sildenafil, lasix, nebs, inhalers) in preparation for PET scan. As her respiratory status did not improve with NRB, placed on HFNC. 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 She received one dose of 125mg  of IV methylprednisolone and then was transferred to the MICU for further management of her hypoxia. Pt had a week long stay in the MICU and was weaned from HFNC to [facility] after IV diuresis and course of antibiotics (completed course of levaquin) for possible pneumonia. Pt has been continuing home airway clearance (Aerobika and HTS neb), and home pulmicort, brovana, atrovent, and PRN albuterol. Showing significant signs of improvement but still having desaturations with ambulation.     Pt started iloprost 9/19 and has tolerated it well so far. RHC 9/20 demonstrated severe pulmonary hypertension, markedly elevated PVR (9.3-10.5) and low cardiac output/index. HRCT chest showed unchanged fibrotic pulmonary sarcoidosis with bilateral segmental and subsegmental fibrosis post left upper lobectomy with unchanged apical right upper lobe and superior segment left lower lobe bullae.     Of note, has been hospitalized twice prior to this in the past 6 weeks.  8/18-8/25: presented with increased O2 requirement, increased swelling, brown sputum production.   - treated for serratia pneumonia (completed levaquin)  - Was discharged on AVAPS for home, as well as with HTS/albuterol and [place].  10/21/22-10/25/22: presented with DOE, up to 8-10L with exertion and desats to 70%.   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 A.niger from sputum 08/2016-09/2016.    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    Sarcoidosis 1995    Type 2 diabetes mellitus with hyperglycemia (CMS-HCC) 07/27/2014    Visual impairment     glasses     Past Surgical History:   Procedure Laterality Date    LUNG LOBECTOMY      PR AMPUTATION TOE,MT-P JT Right 12/12/2019    Procedure: Right foot fifth toe  amputation at the metatarsophalangeal joint level;  Surgeon: Britt Bottom, DPM;  Location: MAIN OR [facility];  Service: Vascular    PR AMPUTATION TOE,MT-P JT Right 06/16/2020    Procedure: Right foot amputation of 3rd toe at mpj level;  Surgeon: Britt Bottom, DPM;  Location: MAIN OR [facility];  Service: Vascular    PR RIGHT HEART CATH O2 SATURATION & CARDIAC OUTPUT N/A 04/29/2019    Procedure: Right Heart Catheterization;  Surgeon: Rosana Hoes, MD;  Location: [facility] CATH;  Service: Cardiology    PR RIGHT HEART CATH O2 SATURATION & CARDIAC OUTPUT N/A 12/08/2022    Procedure: Right Heart Catheterization;  Surgeon: Neal Dy, MD;  Location: [facility] CATH;  Service: Cardiology    PR THORACOSCOPY SURG TOT PULM DECORT Left 12/14/2014    Procedure: THORACOSCOPY SURG; W/TOT PULM DECORTIC/PNEUMOLYS;  Surgeon: Evert Kohl, MD;  Location: MAIN OR [facility];  Service: Cardiothoracic    PR THORACOSCOPY W/THERA WEDGE RESEXN INITIAL UNILAT Left 12/03/2014    Procedure: THORACOSCOPY, SURGICAL; WITH THERAPEUTIC WEDGE RESECTION (EG, MASS, NODULE) INITIAL UNILATERAL;  Surgeon: Evert Kohl, MD;  Location: MAIN OR [facility];  Service: Cardiothoracic       Other History:  Family History   Problem Relation Age of Onset    Dementia Mother     Diabetes Father     Diabetes Brother     Diabetes Maternal Aunt     Sarcoidosis Maternal Aunt     Diabetes Maternal Uncle     Diabetes Paternal Aunt     Diabetes Paternal Uncle      Social History     Socioeconomic History    Marital status: Single   Tobacco Use    Smoking status: Former     Current packs/day: 0.00     Average packs/day: 0.5 packs/day for 9.0 years (4.5 ttl pk-yrs)     Types: Cigarettes     Start date: 05/02/1978     Quit date: 05/03/1987     Years since quitting: 35.6    Smokeless tobacco: Never   Vaping Use    Vaping status: Never Used   Substance and Sexual Activity    Alcohol use: No     Alcohol/week: 0.0 standard drinks of alcohol    Drug use: No    Sexual activity: Yes   Other Topics Concern    Do you use sunscreen? No    Tanning bed use? No    Are you easily burned? No    Excessive sun exposure? No    Blistering sunburns? No     Social Determinants of Health     Financial Resource Strain: Low Risk  (11/06/2022)    Overall Financial Resource Strain (CARDIA)     Difficulty of Paying Living Expenses: Not hard at all   Food Insecurity: No Food Insecurity (11/06/2022)    Hunger Vital Sign     Worried About Running Out of Food in the Last Year: Never true     Ran Out of Food in the Last Year: Never true   Transportation Needs: No Transportation Needs (11/06/2022)    PRAPARE - Therapist, art (Medical): No     Lack of Transportation (Non-Medical): No       Home Medications:  No current facility-administered medications on file prior to encounter.     Current Outpatient Medications on File Prior to Encounter   Medication Sig Dispense Refill    ACCU-CHEK GUIDE GLUCOSE METER Misc 1  Device by Other route Four (4) times a day (before meals and nightly). AS DIRECTED 1 kit 0    albuterol 2.5 mg /3 mL (0.083 %) nebulizer solution Inhale 3 mL (2.5 mg total) by nebulization every six (6) hours as needed for wheezing or shortness of breath (airway clearance/cough). 360 mL 3    albuterol HFA 90 mcg/actuation inhaler Inhale 2 puffs every four (4) hours as needed for wheezing or shortness of breath. 54 g 0    arformoterol (BROVANA) 15 mcg/2 mL Inhale 2 mL (15 mcg total) by nebulization in the morning and 2 mL (15 mcg total) in the evening. 120 mL 2    aspirin-caffeine (BAYER BACK AND BODY) 500-32.5 mg Tab Take by mouth. Takes 2 1/2 pills 3-4 times a day as needed.      azithromycin (ZITHROMAX) 250 MG tablet Take 1 tablet (250 mg total) by mouth daily. Start on 9/1 after completing the levofloxacin 90 tablet 0    blood sugar diagnostic Strp by Other route Three (3) times a day. ACCU-CHEK Guide meter 300 strip 1    budesonide (PULMICORT) 0.5 mg/2 mL nebulizer solution Inhale 2 mL (0.5 mg total) by nebulization in the morning and 2 mL (0.5 mg total) in the evening. 120 mL 2    furosemide (LASIX) 80 MG tablet Take 1 tablet (80 mg total) by mouth two (2) times a day. Take a third dose as needed for increasing weight or increasing swelling. 60 tablet 0    gabapentin (NEURONTIN) 300 MG capsule Take 1 capsule (300 mg total) by mouth Three (3) times a day. 270 capsule 3    insulin aspart (NOVOLOG U-100 INSULIN ASPART) 100 unit/mL vial Inject 0.01-0.2 mL (1-20 Units total) under the skin Four (4) times a day (before meals and nightly). SLIDING SCALE 20 mL 1    insulin glargine (BASAGLAR KWIKPEN U-100 INSULIN) 100 unit/mL (3 mL) injection pen Inject 0.18 mL (18 Units total) under the skin nightly. 15 mL 0    ipratropium (ATROVENT) 0.02 % nebulizer solution Inhale 2.5 mL (500 mcg total) by nebulization four (4) times a day. 300 mL 0    posaconazole (NOXAFIL) 100 mg delayed released tablet Take 2 tablets by mouth once daily 60 tablet 0    sildenafiL, pulm.hypertension, (REVATIO) 20 mg tablet Take 1 tablet (20 mg total) by mouth Three (3) times a day. 90 tablet 0    OXYGEN-AIR DELIVERY SYSTEMS MISC Dose: 2-3 LPM         In-Hospital Medications:  Scheduled:   arformoterol  15 mcg Nebulization BID (RT)    azithromycin  250 mg Oral Daily    budesonide  0.5 mg Nebulization BID (RT)    diclofenac sodium  2 g Topical QID    enoxaparin (LOVENOX) injection  40 mg Subcutaneous Nightly    flu vacc [id number] 6mos up(PF)  0.5 mL Intramuscular During hospitalization    [Provider Hold] furosemide  80 mg Oral BID    gabapentin  300 mg Oral TID    iloprost  5 mcg Nebulization 6XD    insulin glargine  12 Units Subcutaneous Nightly    insulin lispro  0-20 Units Subcutaneous ACHS    insulin lispro  3 Units Subcutaneous 3xd Meals    ipratropium-albuterol  3 mL Nebulization TID (RT)    lidocaine  1 patch Transdermal Daily    magnesium oxide-Mg AA chelate  2 tablet Oral BID    polyethylene glycol  17 g Oral Daily  posaconazole  200 mg Oral Daily    sildenafiL (pulm.hypertension)  40 mg Oral TID    sodium chloride  4 mL Nebulization BID (RT)         PRN Medications:  acetaminophen, albuterol, calcium carbonate, dextrose in water, glucagon, melatonin, [EXPIRED] Implement **AND** [EXPIRED] Care order/instruction **AND** [EXPIRED] Vital signs **AND** Adult Oxygen therapy **AND** [EXPIRED] sodium chloride **AND** [EXPIRED] sodium chloride 0.9% **AND** [EXPIRED] diphenhydrAMINE **AND** [EXPIRED] famotidine **AND** methylPREDNISolone sodium succinate **AND** [EXPIRED] EPINEPHrine, ondansetron **OR** ondansetron, senna, sodium chloride    Allergies:  Allergies as of 11/29/2022    (No Known Allergies)         PHYSICAL EXAM:   BP 102/70  - Pulse 75  - Temp 35.6 ??C (96 ??F) (Axillary)  - Resp 14  - Ht 180.3 cm (5' 11)  - Wt 86.8 kg (191 lb 5.8 oz)  - LMP  (LMP Unknown)  - SpO2 98%  - BMI 26.69 kg/m??   General: Alert, well-appearing, and in no distress.  Eyes: Anicteric sclera, conjunctiva clear.  ENT:   no drainage.  Cardiovascular: Regular rate and rhythm.  Pulmonary: Diminished lung sounds in bilateral upper lobes, crackles bilateral bases, occassional rhonchi  Musculoskeletal: No clubbing., no LE edema  Skin: No rashes or lesions.,    Neuro: No focal neurological deficits.    LABORATORY and RADIOLOGY DATA:     Pertinent Laboratory Data:  Lab Results   Component Value Date    WBC 3.3 (L) 12/19/2022    HGB 10.7 (L) 12/19/2022    HCT 32.4 (L) 12/19/2022    PLT 150 12/19/2022       Lab Results   Component Value Date    NA 139 12/18/2022    K 4.3 12/18/2022    CL 104 12/18/2022    CO2 34.0 (H) 12/18/2022    BUN 36 (H) 12/18/2022    CREATININE 1.54 (H) 12/18/2022    GLU 131 12/18/2022    CALCIUM 9.2 12/18/2022    MG 2.1 12/14/2022    PHOS 5.4 (H) 12/16/2022       Lab Results   Component Value Date    BILITOT 0.7 12/14/2022    BILIDIR <0.10 05/30/2019    PROT 6.9 12/14/2022    ALBUMIN 3.5 12/16/2022    ALT 11 12/14/2022    AST 15 12/14/2022    ALKPHOS 70 12/14/2022    GGT 60 (H) 05/31/2011       Lab Results   Component Value Date    INR 1.02 12/11/2019    APTT 30.9 12/11/2019       Pertinent Micro Data:  Microbiology Results (last day)       ** No results found for the last 24 hours. **             Pertinent Imaging Data:  9/11 CXR   - Extensive fibrotic changes and biapical bulla appear grossly unchanged. Extensive pulmonary fibrosis limits evaluation for superimposed infection.     9/17 Echo    Summary    1. Limited study.    2. The left ventricle is normal in size with normal wall thickness.    3. The left ventricular systolic function is normal, LVEF is visually  estimated at > 55%.    4. The right ventricle is severely dilated in size, with mildly reduced  systolic function.    5. There is severe pulmonary hypertension.    6. TR maximum velocity: 4.4 m/s  Estimated PASP: 86 mmHg.  7. IVC size and inspiratory change suggest mildly elevated right atrial  pressure. (5-10 mmHg).    8. No significant change since prior study from 10/23/2022    9/18 PET CT  Impression   Bulky, partially calcified lymph nodes in the mediastinum and bilateral hilar with no significant to mild uptake, decreased in intensity since prior.   Grossly unchanged metabolically active right supraclavicular lymph nodes, accounting for differences in positioning of the arms and lack of IV contrast.   Bronchiectasis and partially metabolically active reticular opacities are seen bilaterally, most prominent in the lower lobes, overall similar to prior.   Dilated main pulmonary artery measuring up to 4.0 cm, suggestive of pulmonary arterial hypertension.     9/20 RHC  FINAL CARDIAC CATHETERIZATION REPORT    CONCLUSIONS:  - Severe pulmonary Hypertension  - Markedly elevated PVR (9.3 - 10.5)  - Low cardiac output / index     9/25 CXR  No significant interval change since the prior chest radiograph.     9/25 CT Chest High Resolution w/o contrast  Unchanged fibrotic pulmonary sarcoidosis with bilateral segmental and subsegmental fibrosis post left upper lobectomy with unchanged apical right upper lobe and superior segment left lower lobe bullae.     9/27 CXR  Fibrotic interstitial lung disease with biapical large bullae which are better characterized on the prior CT chest. No significant interval changes.     9/28 CXR  Fibrotic interstitial lung disease with biapical large bullae which are better characterized on the prior CT chest. No significant interval changes.

## 2022-12-20 LAB — CBC
HEMATOCRIT: 32.1 % — ABNORMAL LOW (ref 34.0–44.0)
HEMOGLOBIN: 10.5 g/dL — ABNORMAL LOW (ref 11.3–14.9)
MEAN CORPUSCULAR HEMOGLOBIN CONC: 32.6 g/dL (ref 32.0–36.0)
MEAN CORPUSCULAR HEMOGLOBIN: 27.7 pg (ref 25.9–32.4)
MEAN CORPUSCULAR VOLUME: 84.8 fL (ref 77.6–95.7)
MEAN PLATELET VOLUME: 9.2 fL (ref 6.8–10.7)
PLATELET COUNT: 134 10*9/L — ABNORMAL LOW (ref 150–450)
RED BLOOD CELL COUNT: 3.79 10*12/L — ABNORMAL LOW (ref 3.95–5.13)
RED CELL DISTRIBUTION WIDTH: 17.9 % — ABNORMAL HIGH (ref 12.2–15.2)
WBC ADJUSTED: 3.2 10*9/L — ABNORMAL LOW (ref 3.6–11.2)

## 2022-12-20 LAB — BASIC METABOLIC PANEL
ANION GAP: 7 mmol/L (ref 5–14)
ANION GAP: 2 mmol/L — ABNORMAL LOW (ref 5–14)
BLOOD UREA NITROGEN: 31 mg/dL — ABNORMAL HIGH (ref 9–23)
BLOOD UREA NITROGEN: 34 mg/dL — ABNORMAL HIGH (ref 9–23)
BUN / CREAT RATIO: 19
BUN / CREAT RATIO: 21
CALCIUM: 9.5 mg/dL (ref 8.7–10.4)
CALCIUM: 9.8 mg/dL (ref 8.7–10.4)
CHLORIDE: 102 mmol/L (ref 98–107)
CHLORIDE: 105 mmol/L (ref 98–107)
CO2: 30 mmol/L (ref 20.0–31.0)
CO2: 35 mmol/L — ABNORMAL HIGH (ref 20.0–31.0)
CREATININE: 1.66 mg/dL — ABNORMAL HIGH
CREATININE: 1.61 mg/dL — ABNORMAL HIGH
EGFR CKD-EPI (2021) FEMALE: 35 mL/min/{1.73_m2} — ABNORMAL LOW (ref >=60)
EGFR CKD-EPI (2021) FEMALE: 37 mL/min/{1.73_m2} — ABNORMAL LOW (ref >=60)
GLUCOSE RANDOM: 125 mg/dL (ref 70–179)
GLUCOSE RANDOM: 99 mg/dL (ref 70–179)
POTASSIUM: 4.5 mmol/L (ref 3.4–4.8)
POTASSIUM: 4.3 mmol/L (ref 3.4–4.8)
SODIUM: 139 mmol/L (ref 135–145)
SODIUM: 142 mmol/L (ref 135–145)

## 2022-12-20 LAB — MAGNESIUM
MAGNESIUM: 2.1 mg/dL (ref 1.6–2.6)
MAGNESIUM: 2 mg/dL (ref 1.6–2.6)

## 2022-12-20 LAB — PRO-BNP: PRO-BNP: 5280 pg/mL — ABNORMAL HIGH (ref <=300.0)

## 2022-12-20 LAB — LACTATE, VENOUS, WHOLE BLOOD: LACTATE BLOOD VENOUS: 1.3 mmol/L (ref 0.5–1.8)

## 2022-12-20 MED ADMIN — sildenafiL (pulm.hypertension) (REVATIO) tablet 40 mg: 40 mg | ORAL | @ 12:00:00

## 2022-12-20 MED ADMIN — iloprost (VENTAVIS) nebulizer solution: 5 ug | RESPIRATORY_TRACT | @ 13:00:00

## 2022-12-20 MED ADMIN — sodium chloride 3 % NEBULIZER solution 4 mL: 4 mL | RESPIRATORY_TRACT | @ 01:00:00

## 2022-12-20 MED ADMIN — posaconazole (NOXAFIL) delayed released tablet 200 mg: 200 mg | ORAL | @ 12:00:00

## 2022-12-20 MED ADMIN — budesonide (PULMICORT) nebulizer solution 0.5 mg: .5 mg | RESPIRATORY_TRACT | @ 01:00:00

## 2022-12-20 MED ADMIN — enoxaparin (LOVENOX) syringe 40 mg: 40 mg | SUBCUTANEOUS

## 2022-12-20 MED ADMIN — sodium chloride 3 % NEBULIZER solution 4 mL: 4 mL | RESPIRATORY_TRACT | @ 13:00:00

## 2022-12-20 MED ADMIN — insulin lispro (HumaLOG) injection 3 Units: 3 [IU] | SUBCUTANEOUS | @ 21:00:00

## 2022-12-20 MED ADMIN — acetaminophen (TYLENOL) tablet 650 mg: 650 mg | ORAL | @ 12:00:00

## 2022-12-20 MED ADMIN — ipratropium-albuterol (DUO-NEB) 0.5-2.5 mg/3 mL nebulizer solution 3 mL: 3 mL | RESPIRATORY_TRACT | @ 13:00:00

## 2022-12-20 MED ADMIN — sodium chloride 3 % NEBULIZER solution 4 mL: 4 mL | RESPIRATORY_TRACT

## 2022-12-20 MED ADMIN — iloprost (VENTAVIS) nebulizer solution: 5 ug | RESPIRATORY_TRACT | @ 01:00:00

## 2022-12-20 MED ADMIN — sildenafiL (pulm.hypertension) (REVATIO) tablet 40 mg: 40 mg | ORAL | @ 17:00:00

## 2022-12-20 MED ADMIN — iloprost (VENTAVIS) nebulizer solution: 5 ug | RESPIRATORY_TRACT | @ 05:00:00

## 2022-12-20 MED ADMIN — sildenafiL (pulm.hypertension) (REVATIO) tablet 40 mg: 40 mg | ORAL

## 2022-12-20 MED ADMIN — furosemide (LASIX) injection 80 mg: 80 mg | INTRAVENOUS | @ 16:00:00 | Stop: 2022-12-20

## 2022-12-20 MED ADMIN — budesonide (PULMICORT) nebulizer solution 0.5 mg: .5 mg | RESPIRATORY_TRACT

## 2022-12-20 MED ADMIN — iloprost (VENTAVIS) nebulizer solution: 5 ug | RESPIRATORY_TRACT | @ 19:00:00

## 2022-12-20 MED ADMIN — ipratropium-albuterol (DUO-NEB) 0.5-2.5 mg/3 mL nebulizer solution 3 mL: 3 mL | RESPIRATORY_TRACT | @ 22:00:00

## 2022-12-20 MED ADMIN — gabapentin (NEURONTIN) capsule 300 mg: 300 mg | ORAL

## 2022-12-20 MED ADMIN — azithromycin (ZITHROMAX) tablet 250 mg: 250 mg | ORAL | @ 12:00:00 | Stop: 2025-08-25

## 2022-12-20 MED ADMIN — ipratropium-albuterol (DUO-NEB) 0.5-2.5 mg/3 mL nebulizer solution 3 mL: 3 mL | RESPIRATORY_TRACT | @ 01:00:00

## 2022-12-20 MED ADMIN — arformoterol (BROVANA) nebulizer solution 15 mcg/2 mL: 15 ug | RESPIRATORY_TRACT | @ 13:00:00

## 2022-12-20 MED ADMIN — budesonide (PULMICORT) nebulizer solution 0.5 mg: .5 mg | RESPIRATORY_TRACT | @ 13:00:00

## 2022-12-20 MED ADMIN — insulin lispro (HumaLOG) injection 0-20 Units: 0-20 [IU] | SUBCUTANEOUS

## 2022-12-20 MED ADMIN — diclofenac sodium (VOLTAREN) 1 % gel 2 g: 2 g | TOPICAL

## 2022-12-20 MED ADMIN — ipratropium-albuterol (DUO-NEB) 0.5-2.5 mg/3 mL nebulizer solution 3 mL: 3 mL | RESPIRATORY_TRACT | @ 19:00:00

## 2022-12-20 MED ADMIN — ipratropium-albuterol (DUO-NEB) 0.5-2.5 mg/3 mL nebulizer solution 3 mL: 3 mL | RESPIRATORY_TRACT

## 2022-12-20 MED ADMIN — arformoterol (BROVANA) nebulizer solution 15 mcg/2 mL: 15 ug | RESPIRATORY_TRACT | @ 01:00:00

## 2022-12-20 MED ADMIN — insulin glargine (LANTUS) injection 12 Units: 12 [IU] | SUBCUTANEOUS

## 2022-12-20 MED ADMIN — insulin lispro (HumaLOG) injection 3 Units: 3 [IU] | SUBCUTANEOUS | @ 16:00:00

## 2022-12-20 MED ADMIN — iloprost (VENTAVIS) nebulizer solution: 5 ug | RESPIRATORY_TRACT | @ 16:00:00

## 2022-12-20 MED ADMIN — iloprost (VENTAVIS) nebulizer solution: 5 ug | RESPIRATORY_TRACT | @ 22:00:00

## 2022-12-20 MED ADMIN — magnesium oxide-Mg AA chelate (Magnesium Plus Protein) 2 tablet: 2 | ORAL | @ 12:00:00

## 2022-12-20 MED ADMIN — magnesium oxide-Mg AA chelate (Magnesium Plus Protein) 2 tablet: 2 | ORAL

## 2022-12-20 NOTE — Unmapped (Signed)
Pulmonary Consult Service  Consult Follow Up Note      Primary Service:   Medicine  Primary Service Attending:  Melven Sartorius, MD  Reason for Consult:   Assistance with diuresis    ASSESSMENT and PLAN     Karen Barber is a 57 y.o. 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 Pt was found to have acute on chronic respiratory failure (sat in 70s on home 6L) in the setting of not taking her home medications (sildenafil, lasix, nebs, inhalers) in preparation for PET scan, admitted 9/11. She was admitted to the ICU initially, has since been weaned back to 6-8L [place] with airway clearance and diuresis. Her acute on chronic hypoxemia and increase O2 requirement was likely multifactorial -- combination of volume overload as well as lack of airway clearance at home, however I think chronically her O2 requirement (related to her PH) is higher than the amount she has been on at home and she continues to have severe exertional hypoxemia. Working on weaning her back to her home oxygen requirement as follows.     RV Failure  PAH (Group 3, 5)  RHC 9/20 demonstrated severe pulmonary hypertension, markedly elevated PVR (9.3-10.5) and low CO/CI  HRCT completed for PH-ILD diagnosis and Tyvaso enrollment sent to [facility], pending teaching.   Her O2 requirements at baseline now seem to be stable at 8-10 L [facility].    Plan:  - Agree with trial of IV diuresis to see if this improves symptoms in the setting of rising BNP/worsening dyspnea.  - Continue sildenafil 40 mg TID  - Continue inhaled iloprost 5 mcg q4h. Please do not mix with other nebulizer solutions  - Plan to transition to Tyvaso nebs on discharge (arranged with [facility] clinic RN).  - Will clarify with patient status of teaching with [facility]  - Continue daily weight with strict I/O  - cont daily pro-BNP    Acute on Chronic Hypoxemic Respiratory Failure  COPD/Asthma  Pulmonary Sarcoidosis  Bronchiectasis s/s sarcoid  Soluble IL-2 receptor, Aspergillus Galactomannan Antigen, IgE checked 10/2022 all wnl. PET scan 9/17 does not appear to be active pulmonary sarcoidosis and again it is overall felt to be burned out from sarcoidosis standpoint. She is now stable satting at >95% on 8-10L via [facility]. It is likely she has higher O2 requirement chronically than the amount she has been using at home. This may reflect her new baseline and this was discussed with the patient. Discussion had with the patient about more realistic oxygen sats for her and limits on activity at home to prevent desats.     Plan:  - Repeat lower respiratory culture if able  - Set O2 sat goal at 90-94%, wean oxygen as tolerated  - continue home pulmicort, brovana, PRN albuterol, azithromycin  - please cont airway clearance:   - duonebs TID and HTS (3%) BID with aerobika    Chronic Pulmonary Aspergillosis   Aspergillus Galactomannan Antigen, IgE checked 10/2022 all wnl.   - continue home posaconazole   - pt has outpt evaluation for [facility] scheduled for 10/25 for history of recurrent hemoptysis    Ms. Bunyan was seen, examined and discussed with  Dr. Sharlett Iles  Thank you for involving [place] in her care. We look forward to following with you.  Recommendations were communicated to the primary team at 10:14 AM  Please page [place] with any questions or concerns at [phone] (pulmonary consult fellow).    [employer]    I attest that I have reviewed the medical student note and that the components of the history of the present illness, the physical exam, and the assessment and plan documented were performed by me or were performed in my presence by the student where I verified the documentation and performed (or re-performed) the exam and medical decision making. Kandis Fantasia, MD      HISTORY: Interval Hx:   - now on 8L LBNC  - net negative 1.3L on lasix 80mg  PO    History of Present Illness:  Karen Barber is a 57 y.o. female with hx pf pulmonary sarcoidosis, COPD, PAH (baseline home O2 6L), chronic pulmonary aspergillosis, s/p LUL lobectomy 2016 who initially presented to the hospital for a PET scan to evaluate her sarcoidosis.     Pt was found to have acute on chronic respiratory failure (sat in 70s on home 6L) in the setting of not taking her home medications (sildenafil, lasix, nebs, inhalers) in preparation for PET scan. As her respiratory status did not improve with NRB, placed on HFNC. 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 She received one dose of 125mg  of IV methylprednisolone and then was transferred to the MICU for further management of her hypoxia. Pt had a week long stay in the MICU and was weaned from HFNC to [facility] after IV diuresis and course of antibiotics (completed course of levaquin) for possible pneumonia. Pt has been continuing home airway clearance (Aerobika and HTS neb), and home pulmicort, brovana, atrovent, and PRN albuterol. Showing significant signs of improvement but still having desaturations with ambulation.     Pt started iloprost 9/19 and has tolerated it well so far. RHC 9/20 demonstrated severe pulmonary hypertension, markedly elevated PVR (9.3-10.5) and low cardiac output/index. HRCT chest showed unchanged fibrotic pulmonary sarcoidosis with bilateral segmental and subsegmental fibrosis post left upper lobectomy with unchanged apical right upper lobe and superior segment left lower lobe bullae.     Of note, has been hospitalized twice prior to this in the past 6 weeks.  8/18-8/25: presented with increased O2 requirement, increased swelling, brown sputum production.   - treated for serratia pneumonia (completed levaquin)  - Was discharged on AVAPS for home, as well as with HTS/albuterol and [place].  10/21/22-10/25/22: presented with DOE, up to 8-10L with exertion and desats to 70%.   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 A.niger from sputum 08/2016-09/2016.  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    Sarcoidosis 1995    Type 2 diabetes mellitus with hyperglycemia (CMS-HCC) 07/27/2014    Visual impairment     glasses     Past Surgical History:   Procedure Laterality Date    LUNG LOBECTOMY      PR AMPUTATION TOE,MT-P JT Right 12/12/2019    Procedure: Right foot fifth toe amputation at the metatarsophalangeal joint level;  Surgeon: Britt Bottom, DPM;  Location: MAIN OR [facility];  Service: Vascular    PR AMPUTATION TOE,MT-P JT Right 06/16/2020    Procedure: Right foot amputation of 3rd toe at mpj level;  Surgeon: Britt Bottom, DPM;  Location: MAIN [facility];  Service: Vascular    PR RIGHT HEART CATH O2 SATURATION & CARDIAC OUTPUT N/A 04/29/2019    Procedure: Right Heart Catheterization;  Surgeon: Rosana Hoes, MD;  Location: [facility] CATH;  Service: Cardiology    PR RIGHT HEART CATH O2 SATURATION & CARDIAC OUTPUT N/A 12/08/2022    Procedure: Right Heart Catheterization;  Surgeon: Neal Dy, MD;  Location: [facility] CATH;  Service: Cardiology    PR THORACOSCOPY SURG TOT PULM DECORT Left 12/14/2014    Procedure: THORACOSCOPY SURG; W/TOT PULM DECORTIC/PNEUMOLYS;  Surgeon: Evert Kohl, MD;  Location: MAIN OR [facility];  Service: Cardiothoracic    PR THORACOSCOPY W/THERA WEDGE RESEXN INITIAL UNILAT Left 12/03/2014    Procedure: THORACOSCOPY, SURGICAL; WITH THERAPEUTIC WEDGE RESECTION (EG, MASS, NODULE) INITIAL UNILATERAL;  Surgeon: Evert Kohl, MD;  Location: MAIN OR [facility];  Service: Cardiothoracic       Other History:  Family History   Problem Relation Age of Onset    Dementia Mother     Diabetes Father     Diabetes Brother     Diabetes Maternal Aunt     Sarcoidosis Maternal Aunt     Diabetes Maternal Uncle     Diabetes Paternal Aunt     Diabetes Paternal Uncle      Social History     Socioeconomic History    Marital status: Single   Tobacco Use    Smoking status: Former     Current packs/day: 0.00     Average packs/day: 0.5 packs/day for 9.0 years (4.5 ttl pk-yrs)     Types: Cigarettes     Start date: 05/02/1978     Quit date: 05/03/1987     Years since quitting: 35.6    Smokeless tobacco: Never   Vaping Use    Vaping status: Never Used   Substance and Sexual Activity    Alcohol use: No     Alcohol/week: 0.0 standard drinks of alcohol    Drug use: No    Sexual activity: Yes   Other Topics Concern    Do you use sunscreen? No    Tanning bed use? No    Are you easily burned? No    Excessive sun exposure? No    Blistering sunburns? No     Social Determinants of Health     Financial Resource Strain: Low Risk  (11/06/2022)    Overall Financial Resource Strain (CARDIA)     Difficulty of Paying Living Expenses: Not hard at all   Food Insecurity: No Food Insecurity (11/06/2022)    Hunger Vital Sign     Worried About Running Out of Food in the Last Year: Never true     Ran Out of Food in the Last Year: Never true   Transportation Needs: No Transportation Needs (11/06/2022)  PRAPARE - Therapist, art (Medical): No     Lack of Transportation (Non-Medical): No       Home Medications:  No current facility-administered medications on file prior to encounter.     Current Outpatient Medications on File Prior to Encounter   Medication Sig Dispense Refill    ACCU-CHEK GUIDE GLUCOSE METER Misc 1 Device by Other route Four (4) times a day (before meals and nightly). AS DIRECTED 1 kit 0    albuterol 2.5 mg /3 mL (0.083 %) nebulizer solution Inhale 3 mL (2.5 mg total) by nebulization every six (6) hours as needed for wheezing or shortness of breath (airway clearance/cough). 360 mL 3    albuterol HFA 90 mcg/actuation inhaler Inhale 2 puffs every four (4) hours as needed for wheezing or shortness of breath. 54 g 0    arformoterol (BROVANA) 15 mcg/2 mL Inhale 2 mL (15 mcg total) by nebulization in the morning and 2 mL (15 mcg total) in the evening. 120 mL 2    aspirin-caffeine (BAYER BACK AND BODY) 500-32.5 mg Tab Take by mouth. Takes 2 1/2 pills 3-4 times a day as needed.      azithromycin (ZITHROMAX) 250 MG tablet Take 1 tablet (250 mg total) by mouth daily. Start on 9/1 after completing the levofloxacin 90 tablet 0    blood sugar diagnostic Strp by Other route Three (3) times a day. ACCU-CHEK Guide meter 300 strip 1    budesonide (PULMICORT) 0.5 mg/2 mL nebulizer solution Inhale 2 mL (0.5 mg total) by nebulization in the morning and 2 mL (0.5 mg total) in the evening. 120 mL 2    furosemide (LASIX) 80 MG tablet Take 1 tablet (80 mg total) by mouth two (2) times a day. Take a third dose as needed for increasing weight or increasing swelling. 60 tablet 0    gabapentin (NEURONTIN) 300 MG capsule Take 1 capsule (300 mg total) by mouth Three (3) times a day. 270 capsule 3    insulin aspart (NOVOLOG U-100 INSULIN ASPART) 100 unit/mL vial Inject 0.01-0.2 mL (1-20 Units total) under the skin Four (4) times a day (before meals and nightly). SLIDING SCALE 20 mL 1    insulin glargine (BASAGLAR KWIKPEN U-100 INSULIN) 100 unit/mL (3 mL) injection pen Inject 0.18 mL (18 Units total) under the skin nightly. 15 mL 0    ipratropium (ATROVENT) 0.02 % nebulizer solution Inhale 2.5 mL (500 mcg total) by nebulization four (4) times a day. 300 mL 0    posaconazole (NOXAFIL) 100 mg delayed released tablet Take 2 tablets by mouth once daily 60 tablet 0    sildenafiL, pulm.hypertension, (REVATIO) 20 mg tablet Take 1 tablet (20 mg total) by mouth Three (3) times a day. 90 tablet 0    OXYGEN-AIR DELIVERY SYSTEMS MISC Dose: 2-3 LPM         In-Hospital Medications:  Scheduled:   arformoterol  15 mcg Nebulization BID (RT)    azithromycin  250 mg Oral Daily    budesonide  0.5 mg Nebulization BID (RT)    diclofenac sodium  2 g Topical QID    enoxaparin (LOVENOX) injection  40 mg Subcutaneous Nightly    flu vacc [id number] 6mos up(PF)  0.5 mL Intramuscular During hospitalization    [Provider Hold] furosemide  80 mg Oral BID    gabapentin  300 mg Oral TID    iloprost  5 mcg Nebulization 6XD    insulin glargine  12 Units  Subcutaneous Nightly    insulin lispro  0-20 Units Subcutaneous ACHS    insulin lispro  3 Units Subcutaneous 3xd Meals    ipratropium-albuterol  3 mL Nebulization TID (RT)    lidocaine  1 patch Transdermal Daily    magnesium oxide-Mg AA chelate  2 tablet Oral BID    polyethylene glycol  17 g Oral Daily    posaconazole  200 mg Oral Daily    sildenafiL (pulm.hypertension)  40 mg Oral TID    sodium chloride  4 mL Nebulization BID (RT)         PRN Medications:  acetaminophen, albuterol, calcium carbonate, dextrose in water, glucagon, melatonin, [EXPIRED] Implement **AND** [EXPIRED] Care order/instruction **AND** [EXPIRED] Vital signs **AND** Adult Oxygen therapy **AND** [EXPIRED] sodium chloride **AND** [EXPIRED] sodium chloride 0.9% **AND** [EXPIRED] diphenhydrAMINE **AND** [EXPIRED] famotidine **AND** methylPREDNISolone sodium succinate **AND** [EXPIRED] EPINEPHrine, ondansetron **OR** ondansetron, senna, sodium chloride    Allergies:  Allergies as of 11/29/2022    (No Known Allergies)         PHYSICAL EXAM:   BP 90/51  - Pulse 89  - Temp 36.3 ??C (97.4 ??F) (Axillary)  - Resp 19  - Ht 180.3 cm (5' 11)  - Wt 86.8 kg (191 lb 5.8 oz)  - LMP  (LMP Unknown)  - SpO2 (!) 87%  - BMI 26.69 kg/m??   General: Alert, well-appearing, and in no distress.  Eyes: Anicteric sclera, conjunctiva clear.  ENT:   no drainage.  Cardiovascular: Regular rate and rhythm.  Pulmonary: Diminished lung sounds in bilateral upper lobes, crackles bilateral bases, occassional rhonchi  Musculoskeletal: No clubbing., 1+ LE edema L>R  Skin: No rashes or lesions.,    Neuro: No focal neurological deficits.    LABORATORY and RADIOLOGY DATA:     Pertinent Laboratory Data:  Lab Results   Component Value Date    WBC 3.2 (L) 12/20/2022    HGB 10.5 (L) 12/20/2022    HCT 32.1 (L) 12/20/2022    PLT 134 (L) 12/20/2022       Lab Results   Component Value Date    NA 142 12/20/2022    K 4.3 12/20/2022    CL 105 12/20/2022    CO2 35.0 (H) 12/20/2022    BUN 34 (H) 12/20/2022    CREATININE 1.61 (H) 12/20/2022    GLU 99 12/20/2022    CALCIUM 9.8 12/20/2022    MG 2.1 12/20/2022    PHOS 5.4 (H) 12/16/2022       Lab Results   Component Value Date    BILITOT 0.7 12/14/2022    BILIDIR <0.10 05/30/2019    PROT 6.9 12/14/2022    ALBUMIN 3.5 12/16/2022    ALT 11 12/14/2022    AST 15 12/14/2022    ALKPHOS 70 12/14/2022    GGT 60 (H) 05/31/2011       Lab Results   Component Value Date    INR 1.02 12/11/2019    APTT 30.9 12/11/2019       Pertinent Micro Data:  Microbiology Results (last day)       ** No results found for the last 24 hours. **             Pertinent Imaging Data:  9/11 CXR   - Extensive fibrotic changes and biapical bulla appear grossly unchanged. Extensive pulmonary fibrosis limits evaluation for superimposed infection.     9/17 Echo    Summary    1. Limited study.    2. The left  ventricle is normal in size with normal wall thickness.    3. The left ventricular systolic function is normal, LVEF is visually  estimated at > 55%.    4. The right ventricle is severely dilated in size, with mildly reduced  systolic function.    5. There is severe pulmonary hypertension.    6. TR maximum velocity: 4.4 m/s  Estimated PASP: 86 mmHg.    7. IVC size and inspiratory change suggest mildly elevated right atrial  pressure. (5-10 mmHg).    8. No significant change since prior study from 10/23/2022    9/18 PET CT  Impression   Bulky, partially calcified lymph nodes in the mediastinum and bilateral hilar with no significant to mild uptake, decreased in intensity since prior.   Grossly unchanged metabolically active right supraclavicular lymph nodes, accounting for differences in positioning of the arms and lack of IV contrast.   Bronchiectasis and partially metabolically active reticular opacities are seen bilaterally, most prominent in the lower lobes, overall similar to prior.   Dilated main pulmonary artery measuring up to 4.0 cm, suggestive of pulmonary arterial hypertension.     9/20 RHC  FINAL CARDIAC CATHETERIZATION REPORT    CONCLUSIONS:  - Severe pulmonary Hypertension  - Markedly elevated PVR (9.3 - 10.5)  - Low cardiac output / index     9/25 CXR  No significant interval change since the prior chest radiograph.     9/25 CT Chest High Resolution w/o contrast  Unchanged fibrotic pulmonary sarcoidosis with bilateral segmental and subsegmental fibrosis post left upper lobectomy with unchanged apical right upper lobe and superior segment left lower lobe bullae.     9/27 CXR  Fibrotic interstitial lung disease with biapical large bullae which are better characterized on the prior CT chest. No significant interval changes.     9/28 CXR  Fibrotic interstitial lung disease with biapical large bullae which are better characterized on the prior CT chest. No significant interval changes.

## 2022-12-20 NOTE — Unmapped (Signed)
Problem: Adult Inpatient Plan of Care  Goal: Plan of Care Review  Outcome: Ongoing - Unchanged  Goal: Patient-Specific Goal (Individualized)  Outcome: Ongoing - Unchanged  Goal: Absence of Hospital-Acquired Illness or Injury  Outcome: Ongoing - Unchanged  Goal: Optimal Comfort and Wellbeing  Outcome: Ongoing - Unchanged  Goal: Readiness for Transition of Care  Outcome: Ongoing - Unchanged  Goal: Rounds/Family Conference  Outcome: Ongoing - Unchanged     Problem: Fall Injury Risk  Goal: Absence of Fall and Fall-Related Injury  Outcome: Ongoing - Unchanged     Problem: Gas Exchange Impaired  Goal: Optimal Gas Exchange  Outcome: Ongoing - Unchanged     Problem: Comorbidity Management  Goal: Blood Glucose Levels Within Targeted Range  Outcome: Ongoing - Unchanged  Goal: Blood Pressure in Desired Range  Outcome: Ongoing - Unchanged

## 2022-12-20 NOTE — Unmapped (Signed)
Karen Barber/Ox4, ambulates, sitting in chair all day. 8-10L LBNC, NRB needed for activities. Soft Bps, but in parameters. NSR/ST. Voiding, purewick, Bm today 10/1. Good appetite. ACHS.      Problem: Adult Inpatient Plan of Care  Goal: Plan of Care Review  Outcome: Ongoing - Unchanged  Goal: Patient-Specific Goal (Individualized)  Outcome: Ongoing - Unchanged  Goal: Absence of Hospital-Acquired Illness or Injury  Outcome: Ongoing - Unchanged  Intervention: Identify and Manage Fall Risk  Recent Flowsheet Documentation  Taken 12/19/2022 0800 by Arlan Organ, RN  Safety Interventions: low bed  Intervention: Prevent Skin Injury  Recent Flowsheet Documentation  Taken 12/19/2022 1200 by Arlan Organ, RN  Positioning for Skin: Sitting in Chair  Taken 12/19/2022 1000 by Arlan Organ, RN  Positioning for Skin: Sitting in Chair  Taken 12/19/2022 0800 by Arlan Organ, RN  Positioning for Skin: Sitting in Chair  Device Skin Pressure Protection: absorbent pad utilized/changed  Skin Protection: incontinence pads utilized  Intervention: Prevent Infection  Recent Flowsheet Documentation  Taken 12/19/2022 0800 by Arlan Organ, RN  Infection Prevention: hand hygiene promoted  Goal: Optimal Comfort and Wellbeing  Outcome: Ongoing - Unchanged  Goal: Readiness for Transition of Care  Outcome: Ongoing - Unchanged  Goal: Rounds/Family Conference  Outcome: Ongoing - Unchanged     Problem: Fall Injury Risk  Goal: Absence of Fall and Fall-Related Injury  Outcome: Ongoing - Unchanged  Intervention: Promote Injury-Free Environment  Recent Flowsheet Documentation  Taken 12/19/2022 0800 by Arlan Organ, RN  Safety Interventions: low bed     Problem: Gas Exchange Impaired  Goal: Optimal Gas Exchange  Outcome: Ongoing - Unchanged     Problem: Comorbidity Management  Goal: Blood Glucose Levels Within Targeted Range  Outcome: Ongoing - Unchanged  Goal: Blood Pressure in Desired Range  Outcome: Ongoing - Unchanged

## 2022-12-20 NOTE — Unmapped (Signed)
Pt is A&Ox4, O2 sats >90% on 8-10L LBNC, NRB with exertion. Afebrile. Complained of a 9/10 headache this morning, tylenol given per patients request and team made aware. Pt also had some soft pressures with systolic's in the mid to low 80s, MD at bedside and made aware. UO adequate via purewick, no BM this shift. Pt's appetite has been adequate. Standard precautions maintained. No falls/injuries this shift. All monitors with appropriate alarm settings, call bell within reach, see flowsheets/MAR for further info.      Problem: Adult Inpatient Plan of Care  Goal: Plan of Care Review  Outcome: Ongoing - Unchanged  Goal: Patient-Specific Goal (Individualized)  Outcome: Ongoing - Unchanged  Goal: Absence of Hospital-Acquired Illness or Injury  Outcome: Ongoing - Unchanged  Intervention: Identify and Manage Fall Risk  Recent Flowsheet Documentation  Taken 12/20/2022 0800 by Shearon Balo, RN  Safety Interventions:   bed alarm   bleeding precautions   commode/urinal/bedpan at bedside   environmental modification   fall reduction program maintained   lighting adjusted for tasks/safety   low bed  Intervention: Prevent Infection  Recent Flowsheet Documentation  Taken 12/20/2022 0800 by Shearon Balo, RN  Infection Prevention:   environmental surveillance performed   personal protective equipment utilized   equipment surfaces disinfected   hand hygiene promoted   single patient room provided  Goal: Optimal Comfort and Wellbeing  Outcome: Ongoing - Unchanged  Goal: Readiness for Transition of Care  Outcome: Ongoing - Unchanged  Goal: Rounds/Family Conference  Outcome: Ongoing - Unchanged

## 2022-12-20 NOTE — Unmapped (Signed)
Infectious Disease ([facility]) Progress Note    Assessment & Plan:   Karen Barber is a 57 y.o. female whose presentation is complicated by bronchiectasis type 1 in the setting of pulmonary sarcoidosis (6L [place] home O2), HTN, T2DM, COPD, s/p LUL lobectomy, and asthma that presented to [facility] admitted to MICU after becoming hypoxic after holding home meds prior to PET scan. After 1 week diuresing ICU stabilized patient she was transferred to the MPCU.    Principal Problem:    Acute on chronic hypoxic respiratory failure (CMS-HCC)  Active Problems:    Pulmonary sarcoidosis (CMS-HCC)    Type 2 diabetes mellitus with hyperglycemia (CMS-HCC)    COPD (chronic obstructive pulmonary disease) (CMS-HCC)    Chronic pulmonary aspergillosis (CMS-HCC)    Pulmonary hypertension (CMS-HCC)    AKI (acute kidney injury) (CMS-HCC)    Active Problems  #Acute on Chronic Hypoxemic Respiratory Failure -   #Pulmonary Sarcoidosis -   #COPD/asthma -   #Bronchiectasis  Patient has notable history of pulmonary sarcoidosis, COPD, pulmonary hypertension and is on baseline oxygen 6 L.  Recent admission with pneumonia growing Serratia marcescens  s/p 1x Rocephin (8/18), Vancomycin (8/18-8/19), Zosyn (8/18-8/23) discharged on levofloxacin 500 mg ending on 8/31.  Patient held her medications only for the morning prior to an outpatient PET CT scan during which she became hypoxic to the 70s and was admitted to the MICU.  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 She received one dose of 125mg  of IV methylprednisolone. She was weaned from high flow nasal cannula to large bore nasal cannula after IV diuresis and a course of antibiotics for possible pneumonia with Vanco/cefepime later transition to levofloxacin with a total 1 week treatment with antibiotics. Started on vanc/cefepime for broad coverage --> stopped vanc and cefepime 9/12- then levaquin (9/12---9/16).  Patient was transferred to the floor on 9/18 after showing significant signs of improvement but still having occasional desaturations with ambulation. PET scan 9/18 is not indicative of active pulmonary sarcoidosis.  Acute exacerbation likely multifactorial including in not taking her home medications as per directed for airway clearance, volume overload, and higher O2 requirement chronically than the amount she has been using. RHC 9/20 showing significant pulm HTN disease with mean PA pressure of 47, for which pulmonology team started Iloprost. Notably, wedge pressure on that study was 5, which is reassuring against volume as driver of her respiratory status at that time. Patient with increasing O2 requirements and now requiring HFNC after saturating to mid-80s even with 8L and Venturi mask. patient is volume overloaded and will dose with IV Lasix.  -- Continue to wean off O2 with goal saturations of 90-94% per pulmonology  -- AM Lasix 80 IV dose   -- Standing weights daily  -- Reacquiring sputum cultures  -- Plan to get oxymizer for her while admitted. Will need oxymizer at home  -- Continue home airway clearance (DuoNebs and HTS 3 times daily with [place])  -- Continue home Pulmicort, Brovana, Atrovent, as needed albuterol, and azithromycin  -- PT and OT consult     #Pulmonary hypertension - RV failure - orthostatic hypotension  Patient has history of pulmonary hypertension due to pulmonary sarcoidosis/COPD.  TTE this admission 9/17 with severely dilated right atrium with severe pulmonary hypertension PASP 86 mmHg.  Estimated right atrial pressure 5 to 10 mmHg.  On Lasix 80 mg twice daily at home.  Echo showed LVEF greater than 55% and severe pulmonary hypertension no significant change since prior study.  Right  heart cath showed severe pulmonary Hypertension, markedly elevated PVR (9.3 - 10.5) and low cardiac output / index. HRCT scan shows unchanged fibrotic pulmonary sarcoidosis with bilateral segmental and subsegmental fibrosis post left upper lobectomy with unchanged apical right upper lobe and superior segment left lower lobe bullae.   -- Continue inhaled iloprost 5 mcg 6 times a day q4h; please do not mix with other nebulizer solutions   -- Plan to transition to Tyvaso nebs on discharge. Approval pending   -- [facility] rep to do teaching prior to DC    #Acute kidney injury on CKD3 (downtrending)  On admission patient's creatinine 1.31 which appears to be baseline.  On 9/19 patient was found to have an AKI by definition of > 0.3 increase in creatinine in 48 hours.  -- Check lactate f/up   -- Renally dose medications  -- Daily BMP    #Secondary Hyperparathyroidism, Hypocalcemia, and Hyperphosphatemia  Findings are consistent with secondary hyperparathyroidism due to CKD.  Ionized calcium: 4.37 mg/dL (low)  Phosphate: 5.6 mg/dL (elevated)  PTH: 161.0 pg/mL (elevated)  Vitamin D Total (25OH) 35  -- Reconsider need for future oral calcium carbonate 1600 mg TID  -- Reconsider need for future phoslo 667 mg PO TID   -- Reassess PTH levels in 4-6 weeks.    #Type 2 diabetes mellitus with hyperglycemia and hypoglycemia  Hemoglobin A1c 8.3%.  Home regimen includes glargine 18 units nightly and sliding scale insulin.  Large fluctuations in glucose greater than 300 and occasional values ~50.  -- Sliding scale insulin insulin sensitivity factor 40  --12 glargine nightly  -- 3 units lispro tid     #Left leg swelling (resolved )  Patient endorses intermittent left > right leg swelling. Venous doppler unremarkable.      Chronic Problems     #Chronic Pain   - Continuing home gabapentin   - PT/OT consulted, appreciate recs     #Chronic pulmonary aspergillosis  Patient has history of aspergilloma and is on chronic posaconazole. Soluble IL-2 receptor, Aspergillus Galactomannan Antigen, IgE checked 10/2022 all wnl  -Continue home posaconazole Ppx  -Outpatient evaluation for [facility] scheduled for 10/25 to evaluate recurrent hemoptysis     #Iron deficiency anemia  #Chronic normocytic anemia (baseline hemoglobin 11)  Patient has some mild normocytic anemia with hemoglobin of 11. Iron panel shows iron of 37 with iron saturation of 15.   --Iron supplementation with iron dextran 1,000mg  in 250cc    #Hyperlipidemia  Last lipid profile showed an LDL of 146  -- Follow-up outpatient    Issues Impacting Complexity of Management:  -The patient is at high risk for the development of complications of volume overload due to the need to provide IV hydration for suspected hypovolemia in the setting of: Pulmonary hypertension  -Need for the following intensive monitoring parameter(s) due to high risk of clinical decline: continuous oxygen monitoring, telemetry, and frequent monitoring of urine output to assess for the efficacy of diuretic regimen  -Need for intensive oxygen therapy of HFNC, which places the patient at high risk for oxygen toxicity  -The patient is at high risk of complications from pulmonary hypertension    Daily Checklist:  Diet: Regular Diet   DVT PPx: Lovenox 40mg  q24h  Electrolytes: Replete Potassium to >/= 3.6 and Magnesium to >/= 1.8  Code Status: DNR and DNI  Dispo:  Continue MPCU    Team Contact Information:   Primary Team: Infectious Disease (MEDK)  Primary Resident: Zella Richer, MD  Resident's Pager:  [phone] [facility]    Interval History:   No acute events overnight.     No complaints or concerns this morning. Encouraged continued discretion in fluid intake to help with diuresis progress. Denies any worsening shortness of breath, chest pain, fatigue, fevers, or chills.    ROS negative except for where otherwise noted above.    Objective:   Temp:  [36.2 ??C (97.2 ??F)-36.6 ??C (97.8 ??F)] 36.3 ??C (97.4 ??F)  Heart Rate:  [82-93] 89  SpO2 Pulse:  [82-93] 89  Resp:  [19-32] 19  BP: (83-96)/(51-70) 90/51  SpO2:  [87 %-98 %] 87 %,   Intake/Output Summary (Last 24 hours) at 12/20/2022 1229  Last data filed at 12/20/2022 1110  Gross per 24 hour   Intake 600 ml   Output 950 ml   Net -350 ml   , O2 Device: Large bore nasal cannula (cool high flow)  O2 Flow Rate (L/min):  [8 L/min] 8 L/min    Gen: NAD, converses, appears comfortable on large bore nasal cannula  HENT: atraumatic, normocephalic  Heart: RRR  Lungs: Reduced breath sounds in lower lung fields  Abdomen: soft, NTND  Extremities: 1+ lower extremity pitting edema    Shireen Quan, MD  PGY-1 Resident, [facility] Internal Medicine

## 2022-12-21 LAB — BASIC METABOLIC PANEL
ANION GAP: 5 mmol/L (ref 5–14)
ANION GAP: 4 mmol/L — ABNORMAL LOW (ref 5–14)
BLOOD UREA NITROGEN: 40 mg/dL — ABNORMAL HIGH (ref 9–23)
BLOOD UREA NITROGEN: 38 mg/dL — ABNORMAL HIGH (ref 9–23)
BUN / CREAT RATIO: 22
BUN / CREAT RATIO: 22
CALCIUM: 9.7 mg/dL (ref 8.7–10.4)
CALCIUM: 9.8 mg/dL (ref 8.7–10.4)
CHLORIDE: 100 mmol/L (ref 98–107)
CHLORIDE: 101 mmol/L (ref 98–107)
CO2: 35 mmol/L — ABNORMAL HIGH (ref 20.0–31.0)
CO2: 36 mmol/L — ABNORMAL HIGH (ref 20.0–31.0)
CREATININE: 1.82 mg/dL — ABNORMAL HIGH
CREATININE: 1.72 mg/dL — ABNORMAL HIGH
EGFR CKD-EPI (2021) FEMALE: 32 mL/min/{1.73_m2} — ABNORMAL LOW (ref >=60)
EGFR CKD-EPI (2021) FEMALE: 34 mL/min/{1.73_m2} — ABNORMAL LOW (ref >=60)
GLUCOSE RANDOM: 100 mg/dL (ref 70–179)
GLUCOSE RANDOM: 101 mg/dL (ref 70–179)
POTASSIUM: 4.4 mmol/L (ref 3.4–4.8)
POTASSIUM: 4.5 mmol/L (ref 3.4–4.8)
SODIUM: 140 mmol/L (ref 135–145)
SODIUM: 141 mmol/L (ref 135–145)

## 2022-12-21 LAB — MAGNESIUM
MAGNESIUM: 2.2 mg/dL (ref 1.6–2.6)
MAGNESIUM: 2.1 mg/dL (ref 1.6–2.6)

## 2022-12-21 LAB — CBC
HEMATOCRIT: 32.4 % — ABNORMAL LOW (ref 34.0–44.0)
HEMOGLOBIN: 10.7 g/dL — ABNORMAL LOW (ref 11.3–14.9)
MEAN CORPUSCULAR HEMOGLOBIN CONC: 32.9 g/dL (ref 32.0–36.0)
MEAN CORPUSCULAR HEMOGLOBIN: 27.7 pg (ref 25.9–32.4)
MEAN CORPUSCULAR VOLUME: 84.2 fL (ref 77.6–95.7)
MEAN PLATELET VOLUME: 8.9 fL (ref 6.8–10.7)
PLATELET COUNT: 168 10*9/L (ref 150–450)
RED BLOOD CELL COUNT: 3.84 10*12/L — ABNORMAL LOW (ref 3.95–5.13)
RED CELL DISTRIBUTION WIDTH: 18.1 % — ABNORMAL HIGH (ref 12.2–15.2)
WBC ADJUSTED: 3.6 10*9/L (ref 3.6–11.2)

## 2022-12-21 LAB — LACTATE, VENOUS, WHOLE BLOOD: LACTATE BLOOD VENOUS: 1.2 mmol/L (ref 0.5–1.8)

## 2022-12-21 LAB — PRO-BNP: PRO-BNP: 6879 pg/mL — ABNORMAL HIGH (ref <=300.0)

## 2022-12-21 MED ADMIN — ipratropium-albuterol (DUO-NEB) 0.5-2.5 mg/3 mL nebulizer solution 3 mL: 3 mL | RESPIRATORY_TRACT

## 2022-12-21 MED ADMIN — gabapentin (NEURONTIN) capsule 300 mg: 300 mg | ORAL | @ 12:00:00

## 2022-12-21 MED ADMIN — iloprost (VENTAVIS) nebulizer solution: 5 ug | RESPIRATORY_TRACT | @ 19:00:00

## 2022-12-21 MED ADMIN — sildenafiL (pulm.hypertension) (REVATIO) tablet 40 mg: 40 mg | ORAL | @ 01:00:00

## 2022-12-21 MED ADMIN — ipratropium-albuterol (DUO-NEB) 0.5-2.5 mg/3 mL nebulizer solution 3 mL: 3 mL | RESPIRATORY_TRACT | @ 13:00:00

## 2022-12-21 MED ADMIN — diclofenac sodium (VOLTAREN) 1 % gel 2 g: 2 g | TOPICAL | @ 16:00:00

## 2022-12-21 MED ADMIN — insulin glargine (LANTUS) injection 12 Units: 12 [IU] | SUBCUTANEOUS | @ 01:00:00

## 2022-12-21 MED ADMIN — iloprost (VENTAVIS) nebulizer solution: 5 ug | RESPIRATORY_TRACT

## 2022-12-21 MED ADMIN — sildenafiL (pulm.hypertension) (REVATIO) tablet 40 mg: 40 mg | ORAL | @ 12:00:00

## 2022-12-21 MED ADMIN — iloprost (VENTAVIS) nebulizer solution: 5 ug | RESPIRATORY_TRACT | @ 16:00:00

## 2022-12-21 MED ADMIN — furosemide (LASIX) injection 80 mg: 80 mg | INTRAVENOUS | @ 12:00:00 | Stop: 2022-12-21

## 2022-12-21 MED ADMIN — acetaminophen (TYLENOL) tablet 650 mg: 650 mg | ORAL | @ 10:00:00

## 2022-12-21 MED ADMIN — budesonide (PULMICORT) nebulizer solution 0.5 mg: .5 mg | RESPIRATORY_TRACT

## 2022-12-21 MED ADMIN — azithromycin (ZITHROMAX) tablet 250 mg: 250 mg | ORAL | @ 12:00:00 | Stop: 2025-08-25

## 2022-12-21 MED ADMIN — magnesium oxide-Mg AA chelate (Magnesium Plus Protein) 2 tablet: 2 | ORAL | @ 01:00:00

## 2022-12-21 MED ADMIN — iloprost (VENTAVIS) nebulizer solution: 5 ug | RESPIRATORY_TRACT | @ 22:00:00

## 2022-12-21 MED ADMIN — arformoterol (BROVANA) nebulizer solution 15 mcg/2 mL: 15 ug | RESPIRATORY_TRACT

## 2022-12-21 MED ADMIN — iloprost (VENTAVIS) nebulizer solution: 5 ug | RESPIRATORY_TRACT | @ 13:00:00

## 2022-12-21 MED ADMIN — magnesium oxide-Mg AA chelate (Magnesium Plus Protein) 2 tablet: 2 | ORAL | @ 12:00:00

## 2022-12-21 MED ADMIN — sildenafiL (pulm.hypertension) (REVATIO) tablet 40 mg: 40 mg | ORAL | @ 18:00:00

## 2022-12-21 MED ADMIN — furosemide (LASIX) injection 80 mg: 80 mg | INTRAVENOUS | @ 19:00:00 | Stop: 2022-12-21

## 2022-12-21 MED ADMIN — budesonide (PULMICORT) nebulizer solution 0.5 mg: .5 mg | RESPIRATORY_TRACT | @ 13:00:00

## 2022-12-21 MED ADMIN — arformoterol (BROVANA) nebulizer solution 15 mcg/2 mL: 15 ug | RESPIRATORY_TRACT | @ 13:00:00

## 2022-12-21 MED ADMIN — posaconazole (NOXAFIL) delayed released tablet 200 mg: 200 mg | ORAL | @ 12:00:00

## 2022-12-21 MED ADMIN — ipratropium-albuterol (DUO-NEB) 0.5-2.5 mg/3 mL nebulizer solution 3 mL: 3 mL | RESPIRATORY_TRACT | @ 19:00:00

## 2022-12-21 MED ADMIN — enoxaparin (LOVENOX) syringe 40 mg: 40 mg | SUBCUTANEOUS | @ 01:00:00

## 2022-12-21 MED ADMIN — sodium chloride 3 % NEBULIZER solution 4 mL: 4 mL | RESPIRATORY_TRACT | @ 13:00:00

## 2022-12-21 MED ADMIN — sodium chloride 3 % NEBULIZER solution 4 mL: 4 mL | RESPIRATORY_TRACT

## 2022-12-21 NOTE — Unmapped (Signed)
Infectious Disease (MEDK) Progress Note    Assessment & Plan:   Karen Barber is a 57 y.o. female whose presentation is complicated by bronchiectasis type 1 in the setting of pulmonary sarcoidosis (6L [place] home O2), HTN, T2DM, COPD, s/p LUL lobectomy, and asthma that presented to [facility] admitted to MICU after becoming hypoxic after holding home meds prior to PET scan. After 1 week diuresing ICU stabilized patient she was transferred to the MPCU.    Principal Problem:    Acute on chronic hypoxic respiratory failure (CMS-HCC)  Active Problems:    Pulmonary sarcoidosis (CMS-HCC)    Type 2 diabetes mellitus with hyperglycemia (CMS-HCC)    COPD (chronic obstructive pulmonary disease) (CMS-HCC)    Chronic pulmonary aspergillosis (CMS-HCC)    Pulmonary hypertension (CMS-HCC)    AKI (acute kidney injury) (CMS-HCC)    Active Problems  #Acute on Chronic Hypoxemic Respiratory Failure -   #Pulmonary Sarcoidosis -   #COPD/asthma -   #Bronchiectasis  Patient has notable history of pulmonary sarcoidosis, COPD, pulmonary hypertension and is on baseline oxygen 6 L.  Recent admission with pneumonia growing Serratia marcescens  s/p 1x Rocephin (8/18), Vancomycin (8/18-8/19), Zosyn (8/18-8/23) discharged on levofloxacin 500 mg ending on 8/31.  Patient held her medications only for the morning prior to an outpatient PET CT scan during which she became hypoxic to the 70s and was admitted to the MICU.  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 She received one dose of 125mg  of IV methylprednisolone. She was weaned from high flow nasal cannula to large bore nasal cannula after IV diuresis and a course of antibiotics for possible pneumonia with Vanco/cefepime later transition to levofloxacin with a total 1 week treatment with antibiotics. Started on vanc/cefepime for broad coverage --> stopped vanc and cefepime 9/12- then levaquin (9/12---9/16).  Patient was transferred to the floor on 9/18 after showing significant signs of improvement but still having occasional desaturations with ambulation. PET scan 9/18 is not indicative of active pulmonary sarcoidosis.  Acute exacerbation likely multifactorial including in not taking her home medications as per directed for airway clearance, volume overload, and higher O2 requirement chronically than the amount she has been using. RHC 9/20 showing significant pulm HTN disease with mean PA pressure of 47, for which pulmonology team started Iloprost. Notably, wedge pressure on that study was 5, which is reassuring against volume as driver of her respiratory status at that time. Patient with increasing O2 requirements and now requiring HFNC after saturating to mid-80s even with 8L and Venturi mask. patient is volume overloaded and will dose with IV Lasix.  -- Continue to wean off O2 with goal saturations of 90-94% per pulmonology  -- Spot dosing Lasix 80 IV with revaluation in the afternoon for redose.  -- Standing weights daily  -- Reacquiring sputum cultures  -- Plan to get oxymizer for her while admitted. Will need oxymizer at home  -- Continue home airway clearance (DuoNebs and HTS 3 times daily with [place])  -- Continue home Pulmicort, Brovana, Atrovent, as needed albuterol, and azithromycin  -- PT and OT consult     #Pulmonary hypertension - RV failure - orthostatic hypotension  Patient has history of pulmonary hypertension due to pulmonary sarcoidosis/COPD.  TTE this admission 9/17 with severely dilated right atrium with severe pulmonary hypertension PASP 86 mmHg.  Estimated right atrial pressure 5 to 10 mmHg.  On Lasix 80 mg twice daily at home.  Echo showed LVEF greater than 55% and severe pulmonary hypertension no significant  change since prior study.  Right heart cath showed severe pulmonary Hypertension, markedly elevated PVR (9.3 - 10.5) and low cardiac output / index. HRCT scan shows unchanged fibrotic pulmonary sarcoidosis with bilateral segmental and subsegmental fibrosis post left upper lobectomy with unchanged apical right upper lobe and superior segment left lower lobe bullae.   -- Continue inhaled iloprost 5 mcg 6 times a day q4h; please do not mix with other nebulizer solutions   -- Plan to transition to Tyvaso nebs on discharge. Approval pending   -- [facility] rep to do teaching prior to DC    #Acute kidney injury on CKD3 (downtrending)  On admission patient's creatinine 1.31 which appears to be baseline.  On 9/19 patient was found to have an AKI by definition of > 0.3 increase in creatinine in 48 hours.  -- Check lactate f/up   -- Renally dose medications  -- Daily BMP    #Secondary Hyperparathyroidism, Hypocalcemia, and Hyperphosphatemia  Findings are consistent with secondary hyperparathyroidism due to CKD.  Ionized calcium: 4.37 mg/dL (low)  Phosphate: 5.6 mg/dL (elevated)  PTH: 161.0 pg/mL (elevated)  Vitamin D Total (25OH) 35  -- Reconsider need for future oral calcium carbonate 1600 mg TID  -- Reconsider need for future phoslo 667 mg PO TID   -- Reassess PTH levels in 4-6 weeks.    #Type 2 diabetes mellitus with hyperglycemia and hypoglycemia  Hemoglobin A1c 8.3%.  Home regimen includes glargine 18 units nightly and sliding scale insulin.  Large fluctuations in glucose greater than 300 and occasional values ~50.  -- Sliding scale insulin insulin sensitivity factor 40  --12 glargine nightly  -- 3 units lispro tid     #Left leg swelling (resolved )  Patient endorses intermittent left > right leg swelling. Venous doppler unremarkable.      Chronic Problems     #Chronic Pain   - Continuing home gabapentin   - PT/OT consulted, appreciate recs     #Chronic pulmonary aspergillosis  Patient has history of aspergilloma and is on chronic posaconazole. Soluble IL-2 receptor, Aspergillus Galactomannan Antigen, IgE checked 10/2022 all wnl  -Continue home posaconazole Ppx  -Outpatient evaluation for [facility] scheduled for 10/25 to evaluate recurrent hemoptysis     #Iron deficiency anemia  #Chronic normocytic anemia (baseline hemoglobin 11)  Patient has some mild normocytic anemia with hemoglobin of 11. Iron panel shows iron of 37 with iron saturation of 15.   --Iron supplementation with iron dextran 1,000mg  in 250cc    #Hyperlipidemia  Last lipid profile showed an LDL of 146  -- Follow-up outpatient    Issues Impacting Complexity of Management:  -The patient is at high risk for the development of complications of volume overload due to the need to provide IV hydration for suspected hypovolemia in the setting of: Pulmonary hypertension  -Need for the following intensive monitoring parameter(s) due to high risk of clinical decline: continuous oxygen monitoring, telemetry, and frequent monitoring of urine output to assess for the efficacy of diuretic regimen  -Need for intensive oxygen therapy of HFNC, which places the patient at high risk for oxygen toxicity  -The patient is at high risk of complications from pulmonary hypertension    Daily Checklist:  Diet: Regular Diet   DVT PPx: Lovenox 40mg  q24h  Electrolytes: Replete Potassium to >/= 3.6 and Magnesium to >/= 1.8  Code Status: DNR and DNI  Dispo:  Continue MPCU    Team Contact Information:   Primary Team: Infectious Disease (MEDK)  Primary Resident: Rosanne Gutting  Bronson Curb, MD  Resident's Pager: [phone] [facility] Disease Intern - Tower)    Interval History:   No acute events overnight.     No complaints or concerns this morning. Encourage continued discretion with fluid intake. Denies any worsening shortness of breath, chest pain, fatigue, fevers, or chills.    ROS negative except for where otherwise noted above.    Objective:   Temp:  [36.3 ??C (97.3 ??F)-36.6 ??C (97.8 ??F)] 36.6 ??C (97.8 ??F)  Heart Rate:  [80-91] 88  SpO2 Pulse:  [32-89] 32  Resp:  [16-23] 18  BP: (90-141)/(55-121) 92/57  SpO2:  [95 %-100 %] 95 %,   Intake/Output Summary (Last 24 hours) at 12/21/2022 1207  Last data filed at 12/21/2022 1130  Gross per 24 hour   Intake 0 ml   Output 2050 ml   Net -2050 ml , O2 Flow Rate (L/min):  [7 L/min-8 L/min] 7 L/min    Gen: NAD, converses, appears comfortable on large bore nasal cannula  HENT: atraumatic, normocephalic  Heart: RRR  Lungs: Reduced breath sounds in lower lung fields, stable  Abdomen: soft, NTND  Extremities: continued 01+ lower extremity pitting edema    Shireen Quan, MD  PGY-1 Resident, [facility] Internal Medicine

## 2022-12-21 NOTE — Unmapped (Signed)
Pt A/Ox4, ambulates, sitting in chair most of the shift 8-10L LBNC, NRB needed for activities. VSS, Voiding, purewick, patient had a nose bleed at the beginning of shift, has now resolved. Good appetite. ACHS      Problem: Adult Inpatient Plan of Care  Goal: Plan of Care Review  Outcome: Ongoing - Unchanged  Goal: Patient-Specific Goal (Individualized)  Outcome: Ongoing - Unchanged  Goal: Absence of Hospital-Acquired Illness or Injury  Outcome: Ongoing - Unchanged  Intervention: Prevent Skin Injury  Recent Flowsheet Documentation  Taken 12/20/2022 1930 by Raoul Pitch, RN  Positioning for Skin: Sitting in Chair  Goal: Optimal Comfort and Wellbeing  Outcome: Ongoing - Unchanged  Goal: Readiness for Transition of Care  Outcome: Ongoing - Unchanged  Goal: Rounds/Family Conference  Outcome: Ongoing - Unchanged     Problem: Fall Injury Risk  Goal: Absence of Fall and Fall-Related Injury  Outcome: Ongoing - Unchanged     Problem: Gas Exchange Impaired  Goal: Optimal Gas Exchange  Outcome: Ongoing - Unchanged     Problem: Comorbidity Management  Goal: Blood Glucose Levels Within Targeted Range  Outcome: Ongoing - Unchanged  Goal: Blood Pressure in Desired Range  Outcome: Ongoing - Unchanged

## 2022-12-21 NOTE — Unmapped (Unsigned)
Assessment and Plan:     There are no diagnoses linked to this encounter.       I personally spent *** minutes face-to-face and non-face-to-face in the care of this patient, which includes all pre, intra, and post visit time on the date of service.    No follow-ups on file.    HPI:      Karen Barber is here for No chief complaint on file.    {kerichronicdx:74702}    {keriacutedx:74703}     ROS:      Comprehensive 10 point ROS negative unless otherwise stated in the HPI.      [facility] Components:     Medication adherence and barriers to the treatment plan have been addressed. Opportunities to optimize healthy behaviors have been discussed. Patient / caregiver voiced understanding.    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 A.niger from sputum 08/2016-09/2016.    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    Sarcoidosis 1995    Type 2 diabetes mellitus with hyperglycemia (CMS-HCC) 07/27/2014    Visual impairment     glasses     Past Surgical History:   Procedure Laterality Date    LUNG LOBECTOMY      PR AMPUTATION TOE,MT-P JT Right 12/12/2019    Procedure: Right foot fifth toe amputation at the metatarsophalangeal joint level;  Surgeon: Britt Bottom, DPM;  Location: MAIN OR [facility];  Service: Vascular    PR AMPUTATION TOE,MT-P JT Right 06/16/2020    Procedure: Right foot amputation of 3rd toe at mpj level;  Surgeon: Britt Bottom, DPM;  Location: MAIN OR [facility];  Service: Vascular    PR RIGHT HEART CATH O2 SATURATION & CARDIAC OUTPUT N/A 04/29/2019    Procedure: Right Heart Catheterization;  Surgeon: Rosana Hoes, MD;  Location: [facility] CATH;  Service: Cardiology    PR RIGHT HEART CATH O2 SATURATION & CARDIAC OUTPUT N/A 12/08/2022    Procedure: Right Heart Catheterization;  Surgeon: Neal Dy, MD;  Location: [facility] CATH;  Service: Cardiology    PR THORACOSCOPY SURG TOT PULM DECORT Left 12/14/2014    Procedure: THORACOSCOPY SURG; W/TOT PULM DECORTIC/PNEUMOLYS;  Surgeon: Evert Kohl, MD;  Location: MAIN OR [facility];  Service: Cardiothoracic    PR THORACOSCOPY W/THERA WEDGE RESEXN INITIAL UNILAT Left 12/03/2014    Procedure: THORACOSCOPY, SURGICAL; WITH THERAPEUTIC WEDGE RESECTION (EG, MASS, NODULE) INITIAL UNILATERAL;  Surgeon: Evert Kohl, MD;  Location: MAIN OR [facility];  Service: Cardiothoracic       Family History:     Family History   Problem Relation Age of Onset    Dementia Mother     Diabetes Father     Diabetes Brother  Diabetes Maternal Aunt     Sarcoidosis Maternal Aunt     Diabetes Maternal Uncle     Diabetes Paternal Aunt     Diabetes Paternal Uncle        Social History:     Social History     Tobacco Use    Smoking status: Former     Current packs/day: 0.00     Average packs/day: 0.5 packs/day for 9.0 years (4.5 ttl pk-yrs)     Types: Cigarettes     Start date: 05/02/1978     Quit date: 05/03/1987     Years since quitting: 35.6    Smokeless tobacco: Never   Vaping Use    Vaping status: Never Used   Substance Use Topics    Alcohol use: No     Alcohol/week: 0.0 standard drinks of alcohol    Drug use: No       Allergies:     Patient has no known allergies.    Current Medications:     No current facility-administered medications for this visit.     No current outpatient medications on file.     Facility-Administered Medications Ordered in Other Visits Medication Dose Route Frequency Provider Last Rate Last Admin    acetaminophen (TYLENOL) tablet 650 mg  650 mg Oral Q6H PRN Cisneros, Alana L, MD   650 mg at 12/21/22 0629    albuterol 2.5 mg /3 mL (0.083 %) nebulizer solution 2.5 mg  2.5 mg Nebulization Q6H PRN Cisneros, Alana L, MD   2.5 mg at 12/08/22 2038    arformoterol (BROVANA) nebulizer solution 15 mcg/2 mL  15 mcg Nebulization BID (RT) Cisneros, Alana L, MD   15 mcg at 12/20/22 0850    azithromycin (ZITHROMAX) tablet 250 mg  250 mg Oral Daily Cisneros, Alana L, MD   250 mg at 12/21/22 0803    budesonide (PULMICORT) nebulizer solution 0.5 mg  0.5 mg Nebulization BID (RT) Cisneros, Alana L, MD   0.5 mg at 12/20/22 1938    calcium carbonate (TUMS) chewable tablet 200 mg elem calcium  200 mg elem calcium Oral TID PRN Avelino Leeds L, MD   200 mg elem calcium at 12/06/22 2204    dextrose (D10W) 10% bolus 125 mL  12.5 g Intravenous Q10 Min PRN Cisneros, Alana L, MD   125 mL at 12/02/22 0409    diclofenac sodium (VOLTAREN) 1 % gel 2 g  2 g Topical QID Dauenhauer, Bubba Camp, MD   2 g at 12/19/22 2027    enoxaparin (LOVENOX) syringe 40 mg  40 mg Subcutaneous Nightly Cisneros, Alana L, MD   40 mg at 12/20/22 2041    flu vaccine TS 2024-25(44mos up)(PF)(FLULAVAL, FLUARIX, FLUZONE)  0.5 mL Intramuscular During hospitalization White, Carmelina Paddock, MD        [Provider Hold] furosemide (LASIX) tablet 80 mg  80 mg Oral BID Cisneros, Alana L, MD   80 mg at 12/14/22 1346    gabapentin (NEURONTIN) capsule 300 mg  300 mg Oral TID Avelino Leeds L, MD   300 mg at 12/21/22 0803    glucagon injection 1 mg  1 mg Intramuscular Once PRN Cisneros, Rutherford Limerick, MD        iloprost (VENTAVIS) nebulizer solution  5 mcg Nebulization 6XD So, Matsuo, MD   5 mcg at 12/20/22 2001    insulin glargine (LANTUS) injection 12 Units  12 Units Subcutaneous Nightly Conception Chancy, MD   12 Units at 12/20/22 2045  insulin lispro (HumaLOG) injection 0-20 Units  0-20 Units Subcutaneous ACHS Conception Chancy, MD   2 Units at 12/19/22 2016    insulin lispro (HumaLOG) injection 3 Units  3 Units Subcutaneous 3xd Meals Jory Ee, MD   3 Units at 12/20/22 1705    ipratropium-albuterol (DUO-NEB) 0.5-2.5 mg/3 mL nebulizer solution 3 mL  3 mL Nebulization TID (RT) Leveda Anna, MD   3 mL at 12/20/22 1936    lidocaine (ASPERCREME) 4 % 1 patch  1 patch Transdermal Daily Resweber, Collier Salina, MD   1 patch at 12/19/22 9147    magnesium oxide-Mg AA chelate (Magnesium Plus Protein) 2 tablet  2 tablet Oral BID Conception Chancy, MD   2 tablet at 12/21/22 0802    melatonin tablet 6 mg  6 mg Oral Nightly PRN Dauenhauer, Bubba Camp, MD   6 mg at 12/18/22 2025    methylPREDNISolone sodium succinate (PF) (SOLU-Medrol) injection 125 mg  125 mg Intravenous Q4H PRN Cisneros, Alana L, MD        ondansetron (ZOFRAN-ODT) disintegrating tablet 4 mg  4 mg Oral Q8H PRN Cisneros, Alana L, MD        Or    ondansetron (ZOFRAN) injection 4 mg  4 mg Intravenous Q8H PRN Cisneros, Alana L, MD        polyethylene glycol (MIRALAX) packet 17 g  17 g Oral Daily Cisneros, Alana L, MD        posaconazole (NOXAFIL) delayed released tablet 200 mg  200 mg Oral Daily Cisneros, Alana L, MD   200 mg at 12/21/22 0802    senna (SENOKOT) tablet 2 tablet  2 tablet Oral Nightly PRN Cisneros, Rutherford Limerick, MD        sildenafiL (pulm.hypertension) (REVATIO) tablet 40 mg  40 mg Oral TID Avelino Leeds L, MD   40 mg at 12/21/22 0802    sodium chloride (OCEAN) 0.65 % nasal spray 1 spray  1 spray Each Nare Q6H PRN Cisneros, Alana L, MD        sodium chloride 3 % NEBULIZER solution 4 mL  4 mL Nebulization BID (RT) Cisneros, Alana L, MD   4 mL at 12/20/22 1938       Health Maintenance:     Health Maintenance   Topic Date Due    HPV Cotest with Pap Smear (21-65)  Never done    Pap Smear with Cotest HPV (21-65)  02/27/2001    DTaP/Tdap/Td Vaccines (2 - Td or Tdap) 07/07/2017    Colon Cancer Screening  07/18/2017    Mammogram Start Age 33  03/01/2019 Retinal Eye Exam  11/15/2019    COVID-19 Vaccine (5 - 2024-25 season) 11/19/2022    Influenza Vaccine (1) 11/19/2022    Hemoglobin A1c  12/26/2022    Urine Albumin/Creatinine Ratio  01/02/2023    Foot Exam  05/17/2023    Serum Creatinine Monitoring  12/20/2023    Potassium Monitoring  12/20/2023    Lipid Screening  05/17/2027    COPD Spirometry  10/31/2027    Pneumococcal Vaccine 0-64  Completed    Hepatitis C Screen  Completed    Zoster Vaccines  Discontinued       Immunizations:     Immunization History   Administered Date(s) Administered    COVID-19 VAC,BIVALENT(13YR UP),PFIZER 06/29/2021    COVID-19 VACC,MRNA,(PFIZER)(PF) 06/11/2019, 07/02/2019, 11/21/2019    INFLUENZA INJ MDCK PF, QUAD,(FLUCELVAX)(55MO AND UP EGG FREE) 01/19/2021    INFLUENZA TIV (TRI) PF (IM) 01/02/2005, 12/14/2008  Influenza Vaccine Quad(IM)6 MO-Adult(PF) 11/28/2012, 12/19/2014, 12/16/2015, 01/03/2017, 12/06/2017, 01/08/2019, 12/15/2019, 12/21/2021    PNEUMOCOCCAL POLYSACCHARIDE 23-VALENT 07/08/2007, 01/03/2017    PPD Test 01/19/2015    Pneumococcal Conjugate 20-valent 05/16/2022    TdaP 07/08/2007     I have reviewed and (if needed) updated the patient's problem list, medications, allergies, past medical and surgical history, social and family history.     Vital Signs:     Wt Readings from Last 3 Encounters:   12/19/22 86.8 kg (191 lb 5.8 oz)   11/11/22 90.1 kg (198 lb 10.2 oz)   10/31/22 84.8 kg (187 lb)     Temp Readings from Last 3 Encounters:   12/21/22 36.5 ??C (97.7 ??F) (Axillary)   11/12/22 37 ??C (98.6 ??F) (Axillary)   10/25/22 36.6 ??C (97.9 ??F) (Oral)     BP Readings from Last 3 Encounters:   12/21/22 141/121   11/12/22 140/70   10/31/22 125/73     Pulse Readings from Last 3 Encounters:   12/20/22 91   11/12/22 80   10/31/22 79     Estimated body mass index is 26.69 kg/m?? as calculated from the following:    Height as of 11/30/22: 180.3 cm (5' 11).    Weight as of 12/19/22: 86.8 kg (191 lb 5.8 oz).  No height and weight on file for this encounter.      Objective:      General: Alert and oriented x3. Well-appearing. No acute distress. ***  HEENT:  Normocephalic.  Atraumatic. Conjunctiva and sclera normal. OP MMM without lesions. ***  Neck:  Supple. No thyroid enlargement. No adenopathy. ***  Heart:  Regular rate and rhythm. Normal S1, S2. No murmurs, rubs or gallops. ***  Lungs:  No respiratory distress.  Lungs clear to auscultation. No wheezes, rhonchi, or rales. ***  GI/GU:  Soft, +BS, nondistended, non-TTP. No palpable masses or organomegaly. ***  Extremities:  No edema. Peripheral pulses normal. ***  Skin:  Warm, dry. No rash or lesions present. ***  Neuro:  Non-focal. No obvious weakness. ***  Psych:  Affect normal, eye contact good, speech clear and coherent. ***       I attest that I, Arta Bruce, personally documented this note while acting as scribe for Noralyn Pick, FNP.      Arta Bruce, Scribe.  12/26/2022     The documentation recorded by the scribe accurately reflects the service I personally performed and the decisions made by me.     Noralyn Pick, FNP

## 2022-12-21 NOTE — Unmapped (Signed)
Pulmonary Consult Service  Consult Follow Up Note      Primary Service:   Medicine  Primary Service Attending:  Melven Sartorius, MD  Reason for Consult:   Assistance with diuresis    ASSESSMENT and PLAN     Karen Barber is a 57 y.o. 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 Pt was found to have acute on chronic respiratory failure (sat in 70s on home 6L) in the setting of not taking her home medications (sildenafil, lasix, nebs, inhalers) in preparation for PET scan, admitted 9/11. She was admitted to the ICU initially, has since been weaned back to 6-8L [place] with airway clearance and diuresis. Her acute on chronic hypoxemia and increase O2 requirement was likely multifactorial -- combination of volume overload as well as lack of airway clearance at home, however I think chronically her O2 requirement (related to her PH) is higher than the amount she has been on at home and she continues to have severe exertional hypoxemia. Working on weaning her back to her home oxygen requirement as follows.     RV Failure  PAH (Group 3, 5)  RHC 9/20 demonstrated severe pulmonary hypertension, markedly elevated PVR (9.3-10.5) and low CO/CI  HRCT completed for PH-ILD diagnosis and Tyvaso enrollment sent to [facility], pending teaching.   Her O2 requirements seem to be stable at 8-10 L LBNC.    Plan:  - Diuresis per primary team  - Today Received approval fax from [place] [employer] that patient has been approved for PAP assistance (free drug for Tyvaso for one calendar year. [facility] has been notified and plan is to have meds shipped to [facility]. Tentative discharge Oct 8. Per RN note, RN is aware and will be here to teach and start med on Oct 8  - Plan to transition to Tyvaso nebs on discharge (arranged with [facility] clinic RN).  - Continue sildenafil 40 mg TID  - Continue inhaled iloprost 5 mcg q4h. Please do not mix with other nebulizer solutions  - Continue daily weight with strict I/O  - cont daily pro-BNP    Acute on Chronic Hypoxemic Respiratory Failure  COPD/Asthma  Pulmonary Sarcoidosis  Bronchiectasis s/s sarcoid  Soluble IL-2 receptor, Aspergillus Galactomannan Antigen, IgE checked 10/2022 all wnl. PET scan 9/17 does not appear to be active pulmonary sarcoidosis and again it is overall felt to be burned out from sarcoidosis standpoint. She is now stable satting at >95% on 8-10L via [facility]. It is likely she has higher O2 requirement chronically than the amount she has been using at home. This may reflect her new baseline and this was discussed with the patient. Discussion had with the patient about more realistic oxygen sats for her and limits on activity at home to prevent desats.     Plan:  - Repeat lower respiratory culture if able  - Set O2 sat goal at 90-94%, wean oxygen as tolerated  - continue home pulmicort, brovana, PRN albuterol, azithromycin  - please cont airway clearance:   - duonebs TID and HTS (3%) BID with aerobika    Chronic Pulmonary Aspergillosis   Aspergillus Galactomannan Antigen, IgE checked 10/2022 all wnl.   - continue home posaconazole   - pt has outpt evaluation for [facility] scheduled for 10/25 for history of  recurrent hemoptysis    Karen Barber was seen, examined and discussed with  Dr. Sharlett Iles  Thank you for involving [place] in her care. We look forward to following with you.  Recommendations were communicated to the primary team at 10:14 AM  Please page [place] with any questions or concerns at [phone] (pulmonary consult fellow).    Armed forces training and education officer  Medical Student      HISTORY:     History of Present Illness:  Karen Barber is a 57 y.o. female with hx pf pulmonary sarcoidosis, COPD, PAH (baseline home O2 6L), chronic pulmonary aspergillosis, s/p LUL lobectomy 2016 who initially presented to the hospital for a PET scan to evaluate her sarcoidosis.     Pt was found to have acute on chronic respiratory failure (sat in 70s on home 6L) in the setting of not taking her home medications (sildenafil, lasix, nebs, inhalers) in preparation for PET scan. As her respiratory status did not improve with NRB, placed on HFNC. 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 She received one dose of 125mg  of IV methylprednisolone and then was transferred to the MICU for further management of her hypoxia. Pt had a week long stay in the MICU and was weaned from HFNC to [facility] after IV diuresis and course of antibiotics (completed course of levaquin) for possible pneumonia. Pt has been continuing home airway clearance (Aerobika and HTS neb), and home pulmicort, brovana, atrovent, and PRN albuterol. Showing significant signs of improvement but still having desaturations with ambulation.     Pt started iloprost 9/19 and has tolerated it well so far. RHC 9/20 demonstrated severe pulmonary hypertension, markedly elevated PVR (9.3-10.5) and low cardiac output/index. HRCT chest showed unchanged fibrotic pulmonary sarcoidosis with bilateral segmental and subsegmental fibrosis post left upper lobectomy with unchanged apical right upper lobe and superior segment left lower lobe bullae.     Of note, has been hospitalized twice prior to this in the past 6 weeks.  8/18-8/25: presented with increased O2 requirement, increased swelling, brown sputum production.   - treated for serratia pneumonia (completed levaquin)  - Was discharged on AVAPS for home, as well as with HTS/albuterol and [place].  10/21/22-10/25/22: presented with DOE, up to 8-10L with exertion and desats to 70%.   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 A.niger from sputum 08/2016-09/2016.    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    Sarcoidosis 1995    Type 2 diabetes mellitus with hyperglycemia (CMS-HCC) 07/27/2014    Visual impairment     glasses     Past Surgical History:   Procedure Laterality Date    LUNG LOBECTOMY  PR AMPUTATION TOE,MT-P JT Right 12/12/2019    Procedure: Right foot fifth toe amputation at the metatarsophalangeal joint level;  Surgeon: Britt Bottom, DPM;  Location: MAIN OR [facility];  Service: Vascular    PR AMPUTATION TOE,MT-P JT Right 06/16/2020    Procedure: Right foot amputation of 3rd toe at mpj level;  Surgeon: Britt Bottom, DPM;  Location: MAIN OR [facility];  Service: Vascular    PR RIGHT HEART CATH O2 SATURATION & CARDIAC OUTPUT N/A 04/29/2019    Procedure: Right Heart Catheterization;  Surgeon: Rosana Hoes, MD;  Location: [facility] CATH;  Service: Cardiology    PR RIGHT HEART CATH O2 SATURATION & CARDIAC OUTPUT N/A 12/08/2022    Procedure: Right Heart Catheterization;  Surgeon: Neal Dy, MD;  Location: [facility] CATH;  Service: Cardiology    PR THORACOSCOPY SURG TOT PULM DECORT Left 12/14/2014    Procedure: THORACOSCOPY SURG; W/TOT PULM DECORTIC/PNEUMOLYS;  Surgeon: Evert Kohl, MD;  Location: MAIN OR [facility];  Service: Cardiothoracic    PR THORACOSCOPY W/THERA WEDGE RESEXN INITIAL UNILAT Left 12/03/2014    Procedure: THORACOSCOPY, SURGICAL; WITH THERAPEUTIC WEDGE RESECTION (EG, MASS, NODULE) INITIAL UNILATERAL;  Surgeon: Evert Kohl, MD;  Location: MAIN OR [facility];  Service: Cardiothoracic       Other History:  Family History   Problem Relation Age of Onset    Dementia Mother     Diabetes Father     Diabetes Brother     Diabetes Maternal Aunt     Sarcoidosis Maternal Aunt     Diabetes Maternal Uncle     Diabetes Paternal Aunt     Diabetes Paternal Uncle      Social History     Socioeconomic History    Marital status: Single   Tobacco Use    Smoking status: Former     Current packs/day: 0.00     Average packs/day: 0.5 packs/day for 9.0 years (4.5 ttl pk-yrs)     Types: Cigarettes     Start date: 05/02/1978     Quit date: 05/03/1987     Years since quitting: 35.6    Smokeless tobacco: Never   Vaping Use    Vaping status: Never Used   Substance and Sexual Activity    Alcohol use: No     Alcohol/week: 0.0 standard drinks of alcohol    Drug use: No    Sexual activity: Yes   Other Topics Concern    Do you use sunscreen? No    Tanning bed use? No    Are you easily burned? No    Excessive sun exposure? No    Blistering sunburns? No     Social Determinants of Health     Financial Resource Strain: Low Risk  (11/06/2022)    Overall Financial Resource Strain (CARDIA)     Difficulty of Paying Living Expenses: Not hard at all   Food Insecurity: No Food Insecurity (11/06/2022)    Hunger Vital Sign     Worried About Running Out of Food in the Last Year: Never true     Ran Out of Food in the Last Year: Never true   Transportation Needs: No Transportation Needs (11/06/2022)    PRAPARE - Therapist, art (Medical): No     Lack of Transportation (Non-Medical): No       Home Medications:  No current facility-administered medications on file prior to encounter.     Current Outpatient Medications on File Prior to Encounter  Medication Sig Dispense Refill    ACCU-CHEK GUIDE GLUCOSE METER Misc 1 Device by Other route Four (4) times a day (before meals and nightly). AS DIRECTED 1 kit 0    albuterol 2.5 mg /3 mL (0.083 %) nebulizer solution Inhale 3 mL (2.5 mg total) by nebulization every six (6) hours as needed for wheezing or shortness of breath (airway clearance/cough). 360 mL 3    albuterol HFA 90 mcg/actuation inhaler Inhale 2 puffs every four (4) hours as needed for wheezing or shortness of breath. 54 g 0    arformoterol (BROVANA) 15 mcg/2 mL Inhale 2 mL (15 mcg total) by nebulization in the morning and 2 mL (15 mcg total) in the evening. 120 mL 2    aspirin-caffeine (BAYER BACK AND BODY) 500-32.5 mg Tab Take by mouth. Takes 2 1/2 pills 3-4 times a day as needed.      azithromycin (ZITHROMAX) 250 MG tablet Take 1 tablet (250 mg total) by mouth daily. Start on 9/1 after completing the levofloxacin 90 tablet 0    blood sugar diagnostic Strp by Other route Three (3) times a day. ACCU-CHEK Guide meter 300 strip 1    budesonide (PULMICORT) 0.5 mg/2 mL nebulizer solution Inhale 2 mL (0.5 mg total) by nebulization in the morning and 2 mL (0.5 mg total) in the evening. 120 mL 2    furosemide (LASIX) 80 MG tablet Take 1 tablet (80 mg total) by mouth two (2) times a day. Take a third dose as needed for increasing weight or increasing swelling. 60 tablet 0    gabapentin (NEURONTIN) 300 MG capsule Take 1 capsule (300 mg total) by mouth Three (3) times a day. 270 capsule 3    insulin aspart (NOVOLOG U-100 INSULIN ASPART) 100 unit/mL vial Inject 0.01-0.2 mL (1-20 Units total) under the skin Four (4) times a day (before meals and nightly). SLIDING SCALE 20 mL 1    insulin glargine (BASAGLAR KWIKPEN U-100 INSULIN) 100 unit/mL (3 mL) injection pen Inject 0.18 mL (18 Units total) under the skin nightly. 15 mL 0    ipratropium (ATROVENT) 0.02 % nebulizer solution Inhale 2.5 mL (500 mcg total) by nebulization four (4) times a day. 300 mL 0    posaconazole (NOXAFIL) 100 mg delayed released tablet Take 2 tablets by mouth once daily 60 tablet 0    sildenafiL, pulm.hypertension, (REVATIO) 20 mg tablet Take 1 tablet (20 mg total) by mouth Three (3) times a day. 90 tablet 0    OXYGEN-AIR DELIVERY SYSTEMS MISC Dose: 2-3 LPM         In-Hospital Medications:  Scheduled:   arformoterol  15 mcg Nebulization BID (RT)    azithromycin  250 mg Oral Daily    budesonide  0.5 mg Nebulization BID (RT)    diclofenac sodium  2 g Topical QID    enoxaparin (LOVENOX) injection  40 mg Subcutaneous Nightly    flu vacc [id number] 6mos up(PF)  0.5 mL Intramuscular During hospitalization    [Provider Hold] furosemide  80 mg Oral BID    gabapentin  300 mg Oral TID    iloprost  5 mcg Nebulization 6XD    insulin glargine  12 Units Subcutaneous Nightly    insulin lispro  0-20 Units Subcutaneous ACHS    insulin lispro  3 Units Subcutaneous 3xd Meals    ipratropium-albuterol  3 mL Nebulization TID (RT)    lidocaine  1 patch Transdermal Daily    magnesium oxide-Mg AA chelate  2 tablet Oral  BID    polyethylene glycol  17 g Oral Daily    posaconazole  200 mg Oral Daily    sildenafiL (pulm.hypertension)  40 mg Oral TID    sodium chloride  4 mL Nebulization BID (RT)         PRN Medications:  acetaminophen, albuterol, calcium carbonate, dextrose in water, glucagon, melatonin, [EXPIRED] Implement **AND** [EXPIRED] Care order/instruction **AND** [EXPIRED] Vital signs **AND** Adult Oxygen therapy **AND** [EXPIRED] sodium chloride **AND** [EXPIRED] sodium chloride 0.9% **AND** [EXPIRED] diphenhydrAMINE **AND** [EXPIRED] famotidine **AND** methylPREDNISolone sodium succinate **AND** [EXPIRED] EPINEPHrine, ondansetron **OR** ondansetron, senna, sodium chloride    Allergies:  Allergies as of 11/29/2022    (No Known Allergies)         PHYSICAL EXAM:   BP 92/57  - Pulse 88  - Temp 36.6 ??C (97.8 ??F) (Oral)  - Resp 18  - Ht 180.3 cm (5' 11)  - Wt 87.7 kg (193 lb 5.5 oz)  - LMP  (LMP Unknown)  - SpO2 95%  - BMI 26.97 kg/m??   General: Alert, well-appearing, and in no distress.  Eyes: Anicteric sclera, conjunctiva clear.  ENT:   no drainage.  Cardiovascular: Regular rate and rhythm.  Pulmonary: Diminished lung sounds in bilateral upper lobes, crackles bilateral bases  Musculoskeletal: No clubbing., trace LE edema  Skin: No rashes or lesions.,    Neuro: No focal neurological deficits.    LABORATORY and RADIOLOGY DATA:     Pertinent Laboratory Data:  Lab Results   Component Value Date    WBC 3.6 12/21/2022    HGB 10.7 (L) 12/21/2022    HCT 32.4 (L) 12/21/2022    PLT 168 12/21/2022       Lab Results   Component Value Date    NA 141 12/21/2022    K 4.5 12/21/2022    CL 101 12/21/2022    CO2 36.0 (H) 12/21/2022    BUN 38 (H) 12/21/2022    CREATININE 1.72 (H) 12/21/2022    GLU 101 12/21/2022    CALCIUM 9.8 12/21/2022    MG 2.2 12/21/2022    PHOS 5.4 (H) 12/16/2022       Lab Results   Component Value Date    BILITOT 0.7 12/14/2022    BILIDIR <0.10 05/30/2019    PROT 6.9 12/14/2022    ALBUMIN 3.5 12/16/2022    ALT 11 12/14/2022    AST 15 12/14/2022    ALKPHOS 70 12/14/2022    GGT 60 (H) 05/31/2011       Lab Results   Component Value Date    INR 1.02 12/11/2019    APTT 30.9 12/11/2019       Pertinent Micro Data:  Microbiology Results (last day)       ** No results found for the last 24 hours. **             Pertinent Imaging Data:  9/11 CXR   - Extensive fibrotic changes and biapical bulla appear grossly unchanged. Extensive pulmonary fibrosis limits evaluation for superimposed infection.     9/17 Echo    Summary    1. Limited study.    2. The left ventricle is normal in size with normal wall thickness.    3. The left ventricular systolic function is normal, LVEF is visually  estimated at > 55%.    4. The right ventricle is severely dilated in size, with mildly reduced  systolic function.    5. There is severe pulmonary hypertension.    6.  TR maximum velocity: 4.4 m/s  Estimated PASP: 86 mmHg.    7. IVC size and inspiratory change suggest mildly elevated right atrial  pressure. (5-10 mmHg).    8. No significant change since prior study from 10/23/2022    9/18 PET CT  Impression   Bulky, partially calcified lymph nodes in the mediastinum and bilateral hilar with no significant to mild uptake, decreased in intensity since prior.   Grossly unchanged metabolically active right supraclavicular lymph nodes, accounting for differences in positioning of the arms and lack of IV contrast.   Bronchiectasis and partially metabolically active reticular opacities are seen bilaterally, most prominent in the lower lobes, overall similar to prior.   Dilated main pulmonary artery measuring up to 4.0 cm, suggestive of pulmonary arterial hypertension.     9/20 RHC  FINAL CARDIAC CATHETERIZATION REPORT    CONCLUSIONS:  - Severe pulmonary Hypertension  - Markedly elevated PVR (9.3 - 10.5)  - Low cardiac output / index     9/25 CXR  No significant interval change since the prior chest radiograph.     9/25 CT Chest High Resolution w/o contrast  Unchanged fibrotic pulmonary sarcoidosis with bilateral segmental and subsegmental fibrosis post left upper lobectomy with unchanged apical right upper lobe and superior segment left lower lobe bullae.     9/27 CXR  Fibrotic interstitial lung disease with biapical large bullae which are better characterized on the prior CT chest. No significant interval changes.     9/28 CXR  Fibrotic interstitial lung disease with biapical large bullae which are better characterized on the prior CT chest. No significant interval changes.

## 2022-12-21 NOTE — Unmapped (Signed)
Problem: Adult Inpatient Plan of Care  Goal: Plan of Care Review  Outcome: Ongoing - Unchanged  Goal: Patient-Specific Goal (Individualized)  Outcome: Ongoing - Unchanged  Goal: Absence of Hospital-Acquired Illness or Injury  Outcome: Ongoing - Unchanged  Intervention: Prevent Skin Injury  Recent Flowsheet Documentation  Taken 12/20/2022 1400 by Natalia Leatherwood, RN  Positioning for Skin: Sitting in Chair  Device Skin Pressure Protection: absorbent pad utilized/changed  Skin Protection: incontinence pads utilized  Intervention: Prevent and Manage VTE (Venous Thromboembolism) Risk  Recent Flowsheet Documentation  Taken 12/20/2022 1400 by Natalia Leatherwood, RN  Anti-Embolism Intervention: Refused  Goal: Optimal Comfort and Wellbeing  Outcome: Ongoing - Unchanged  Goal: Readiness for Transition of Care  Outcome: Ongoing - Unchanged  Goal: Rounds/Family Conference  Outcome: Ongoing - Unchanged

## 2022-12-21 NOTE — Unmapped (Signed)
Received approval fax from [place] [employer] that patient has been approved for PAP assistance (free drug) for Tyvaso for one calendar year. Copy of letter imported to [employer]. I have also notified [facility]. Plan is to have medication shipped to [facility] and tentative discharge on Tuesday, Oct 8. Annamarie Major, RN is aware and will be here to teach and start medication on Tuesday.

## 2022-12-21 NOTE — Unmapped (Signed)
Pt A/Ox4, up to chair during day. 8L LBNC. Pressures softer, within parameters. NSR. Afebrile. IV lasix given x2. Voiding with purewick. Decreased appetite.      Problem: Adult Inpatient Plan of Care  Goal: Plan of Care Review  Outcome: Ongoing - Unchanged  Goal: Patient-Specific Goal (Individualized)  Outcome: Ongoing - Unchanged  Goal: Absence of Hospital-Acquired Illness or Injury  Outcome: Ongoing - Unchanged  Intervention: Prevent Skin Injury  Recent Flowsheet Documentation  Taken 12/21/2022 1600 by Arlan Organ, RN  Positioning for Skin: Sitting in Chair  Goal: Optimal Comfort and Wellbeing  Outcome: Ongoing - Unchanged  Goal: Readiness for Transition of Care  Outcome: Ongoing - Unchanged  Goal: Rounds/Family Conference  Outcome: Ongoing - Unchanged     Problem: Fall Injury Risk  Goal: Absence of Fall and Fall-Related Injury  Outcome: Ongoing - Unchanged     Problem: Gas Exchange Impaired  Goal: Optimal Gas Exchange  Outcome: Ongoing - Unchanged     Problem: Comorbidity Management  Goal: Blood Glucose Levels Within Targeted Range  Outcome: Ongoing - Unchanged  Goal: Blood Pressure in Desired Range  Outcome: Ongoing - Unchanged

## 2022-12-22 LAB — CBC
HEMATOCRIT: 32.7 % — ABNORMAL LOW (ref 34.0–44.0)
HEMOGLOBIN: 10.7 g/dL — ABNORMAL LOW (ref 11.3–14.9)
MEAN CORPUSCULAR HEMOGLOBIN CONC: 32.8 g/dL (ref 32.0–36.0)
MEAN CORPUSCULAR HEMOGLOBIN: 27.6 pg (ref 25.9–32.4)
MEAN CORPUSCULAR VOLUME: 84 fL (ref 77.6–95.7)
MEAN PLATELET VOLUME: 8.6 fL (ref 6.8–10.7)
PLATELET COUNT: 156 10*9/L (ref 150–450)
RED BLOOD CELL COUNT: 3.89 10*12/L — ABNORMAL LOW (ref 3.95–5.13)
RED CELL DISTRIBUTION WIDTH: 17.8 % — ABNORMAL HIGH (ref 12.2–15.2)
WBC ADJUSTED: 2.7 10*9/L — ABNORMAL LOW (ref 3.6–11.2)

## 2022-12-22 LAB — BASIC METABOLIC PANEL
ANION GAP: 4 mmol/L — ABNORMAL LOW (ref 5–14)
ANION GAP: 9 mmol/L (ref 5–14)
BLOOD UREA NITROGEN: 41 mg/dL — ABNORMAL HIGH (ref 9–23)
BLOOD UREA NITROGEN: 41 mg/dL — ABNORMAL HIGH (ref 9–23)
BUN / CREAT RATIO: 23
BUN / CREAT RATIO: 26
CALCIUM: 9.9 mg/dL (ref 8.7–10.4)
CALCIUM: 9.6 mg/dL (ref 8.7–10.4)
CHLORIDE: 100 mmol/L (ref 98–107)
CHLORIDE: 100 mmol/L (ref 98–107)
CO2: 38 mmol/L — ABNORMAL HIGH (ref 20.0–31.0)
CO2: 32 mmol/L — ABNORMAL HIGH (ref 20.0–31.0)
CREATININE: 1.79 mg/dL — ABNORMAL HIGH
CREATININE: 1.58 mg/dL — ABNORMAL HIGH
EGFR CKD-EPI (2021) FEMALE: 32 mL/min/{1.73_m2} — ABNORMAL LOW (ref >=60)
EGFR CKD-EPI (2021) FEMALE: 38 mL/min/{1.73_m2} — ABNORMAL LOW (ref >=60)
GLUCOSE RANDOM: 86 mg/dL (ref 70–179)
GLUCOSE RANDOM: 196 mg/dL — ABNORMAL HIGH (ref 70–179)
POTASSIUM: 4.1 mmol/L (ref 3.4–4.8)
SODIUM: 142 mmol/L (ref 135–145)
SODIUM: 141 mmol/L (ref 135–145)

## 2022-12-22 LAB — PRO-BNP: PRO-BNP: 6048 pg/mL — ABNORMAL HIGH (ref <=300.0)

## 2022-12-22 LAB — POTASSIUM: POTASSIUM: 4.2 mmol/L (ref 3.4–4.8)

## 2022-12-22 LAB — MAGNESIUM
MAGNESIUM: 2.1 mg/dL (ref 1.6–2.6)
MAGNESIUM: 2.1 mg/dL (ref 1.6–2.6)

## 2022-12-22 LAB — LACTATE, VENOUS, WHOLE BLOOD: LACTATE BLOOD VENOUS: 1.5 mmol/L (ref 0.5–1.8)

## 2022-12-22 MED ADMIN — magnesium oxide-Mg AA chelate (Magnesium Plus Protein) 2 tablet: 2 | ORAL | @ 13:00:00

## 2022-12-22 MED ADMIN — azithromycin (ZITHROMAX) tablet 250 mg: 250 mg | ORAL | @ 13:00:00 | Stop: 2025-08-25

## 2022-12-22 MED ADMIN — insulin lispro (HumaLOG) injection 3 Units: 3 [IU] | SUBCUTANEOUS | @ 20:00:00

## 2022-12-22 MED ADMIN — posaconazole (NOXAFIL) delayed released tablet 200 mg: 200 mg | ORAL | @ 13:00:00

## 2022-12-22 MED ADMIN — iloprost (VENTAVIS) nebulizer solution: 5 ug | RESPIRATORY_TRACT | @ 13:00:00

## 2022-12-22 MED ADMIN — iloprost (VENTAVIS) nebulizer solution: 5 ug | RESPIRATORY_TRACT | @ 22:00:00

## 2022-12-22 MED ADMIN — diclofenac sodium (VOLTAREN) 1 % gel 2 g: 2 g | TOPICAL | @ 21:00:00

## 2022-12-22 MED ADMIN — magnesium oxide-Mg AA chelate (Magnesium Plus Protein) 2 tablet: 2 | ORAL

## 2022-12-22 MED ADMIN — furosemide (LASIX) injection 80 mg: 80 mg | INTRAVENOUS | @ 16:00:00 | Stop: 2022-12-22

## 2022-12-22 MED ADMIN — sildenafiL (pulm.hypertension) (REVATIO) tablet 40 mg: 40 mg | ORAL

## 2022-12-22 MED ADMIN — iloprost (VENTAVIS) nebulizer solution: 5 ug | RESPIRATORY_TRACT | @ 03:00:00

## 2022-12-22 MED ADMIN — furosemide (LASIX) injection 80 mg: 80 mg | INTRAVENOUS | @ 21:00:00 | Stop: 2022-12-22

## 2022-12-22 MED ADMIN — sildenafiL (pulm.hypertension) (REVATIO) tablet 40 mg: 40 mg | ORAL | @ 18:00:00

## 2022-12-22 MED ADMIN — diclofenac sodium (VOLTAREN) 1 % gel 2 g: 2 g | TOPICAL | @ 16:00:00

## 2022-12-22 MED ADMIN — diclofenac sodium (VOLTAREN) 1 % gel 2 g: 2 g | TOPICAL

## 2022-12-22 MED ADMIN — arformoterol (BROVANA) nebulizer solution 15 mcg/2 mL: 15 ug | RESPIRATORY_TRACT | @ 13:00:00

## 2022-12-22 MED ADMIN — lidocaine (ASPERCREME) 4 % 1 patch: 1 | TRANSDERMAL | @ 13:00:00

## 2022-12-22 MED ADMIN — ipratropium-albuterol (DUO-NEB) 0.5-2.5 mg/3 mL nebulizer solution 3 mL: 3 mL | RESPIRATORY_TRACT | @ 13:00:00

## 2022-12-22 MED ADMIN — acetaminophen (TYLENOL) tablet 650 mg: 650 mg | ORAL | @ 18:00:00

## 2022-12-22 MED ADMIN — ipratropium-albuterol (DUO-NEB) 0.5-2.5 mg/3 mL nebulizer solution 3 mL: 3 mL | RESPIRATORY_TRACT | @ 19:00:00

## 2022-12-22 MED ADMIN — sildenafiL (pulm.hypertension) (REVATIO) tablet 40 mg: 40 mg | ORAL | @ 13:00:00

## 2022-12-22 MED ADMIN — iloprost (VENTAVIS) nebulizer solution: 5 ug | RESPIRATORY_TRACT | @ 19:00:00

## 2022-12-22 MED ADMIN — insulin lispro (HumaLOG) injection 3 Units: 3 [IU] | SUBCUTANEOUS

## 2022-12-22 MED ADMIN — enoxaparin (LOVENOX) syringe 40 mg: 40 mg | SUBCUTANEOUS

## 2022-12-22 MED ADMIN — sodium chloride 3 % NEBULIZER solution 4 mL: 4 mL | RESPIRATORY_TRACT | @ 13:00:00

## 2022-12-22 MED ADMIN — insulin glargine (LANTUS) injection 12 Units: 12 [IU] | SUBCUTANEOUS

## 2022-12-22 MED ADMIN — iloprost (VENTAVIS) nebulizer solution: 5 ug | RESPIRATORY_TRACT | @ 17:00:00

## 2022-12-22 MED ADMIN — insulin lispro (HumaLOG) injection 3 Units: 3 [IU] | SUBCUTANEOUS | @ 14:00:00

## 2022-12-22 MED ADMIN — budesonide (PULMICORT) nebulizer solution 0.5 mg: .5 mg | RESPIRATORY_TRACT | @ 13:00:00

## 2022-12-22 NOTE — Unmapped (Signed)
Infectious Disease ([facility]) Progress Note    Assessment & Plan:   Karen Barber is a 57 y.o. female whose presentation is complicated by bronchiectasis type 1 in the setting of pulmonary sarcoidosis (6L [place] home O2), HTN, T2DM, COPD, s/p LUL lobectomy, and asthma that presented to [facility] admitted to MICU after becoming hypoxic after holding home meds prior to PET scan. After 1 week diuresing ICU stabilized patient she was transferred to the MPCU.    Principal Problem:    Acute on chronic hypoxic respiratory failure (CMS-HCC)  Active Problems:    Pulmonary sarcoidosis (CMS-HCC)    Type 2 diabetes mellitus with hyperglycemia (CMS-HCC)    COPD (chronic obstructive pulmonary disease) (CMS-HCC)    Chronic pulmonary aspergillosis (CMS-HCC)    Pulmonary hypertension (CMS-HCC)    AKI (acute kidney injury) (CMS-HCC)    Active Problems  #Acute on Chronic Hypoxemic Respiratory Failure -   #Pulmonary Sarcoidosis -   #COPD/asthma -   #Bronchiectasis  Patient has notable history of pulmonary sarcoidosis, COPD, pulmonary hypertension and is on baseline oxygen 6 L.  Recent admission with pneumonia growing Serratia marcescens  s/p 1x Rocephin (8/18), Vancomycin (8/18-8/19), Zosyn (8/18-8/23) discharged on levofloxacin 500 mg ending on 8/31.  Patient held her medications only for the morning prior to an outpatient PET CT scan during which she became hypoxic to the 70s and was admitted to the MICU.  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 She received one dose of 125mg  of IV methylprednisolone. She was weaned from high flow nasal cannula to large bore nasal cannula after IV diuresis and a course of antibiotics for possible pneumonia with Vanco/cefepime later transition to levofloxacin with a total 1 week treatment with antibiotics. Started on vanc/cefepime for broad coverage --> stopped vanc and cefepime 9/12- then levaquin (9/12---9/16).  Patient was transferred to the floor on 9/18 after showing significant signs of improvement but still having occasional desaturations with ambulation. PET scan 9/18 is not indicative of active pulmonary sarcoidosis.  Acute exacerbation likely multifactorial including in not taking her home medications as per directed for airway clearance, volume overload, and higher O2 requirement chronically than the amount she has been using. RHC 9/20 showing significant pulm HTN disease with mean PA pressure of 47, for which pulmonology team started Iloprost. Notably, wedge pressure on that study was 5, which is reassuring against volume as driver of her respiratory status at that time. Patient with increasing O2 requirements and now requiring HFNC after saturating to mid-80s even with 8L and Venturi mask. patient is volume overloaded and will dose with IV Lasix.  -- Continue to wean O2 with goal saturations of 90-94% per pulmonology  -- Spot dosing Lasix 80 IV (hoping for 2x doses today, 10/4) with net negative goal -1L  -- Standing weights daily  -- Reacquiring sputum cultures  -- Plan to get oxymizer for her while admitted. Will need oxymizer at home  -- Continue home airway clearance (DuoNebs and HTS 3 times daily with [place])  -- Continue home Pulmicort, Brovana, Atrovent, as needed albuterol, and azithromycin  -- PT and OT consult     #Pulmonary hypertension - RV failure - orthostatic hypotension  Patient has history of pulmonary hypertension due to pulmonary sarcoidosis/COPD.  TTE this admission 9/17 with severely dilated right atrium with severe pulmonary hypertension PASP 86 mmHg.  Estimated right atrial pressure 5 to 10 mmHg.  On Lasix 80 mg twice daily at home.  Echo showed LVEF greater than 55% and severe pulmonary  hypertension no significant change since prior study.  Right heart cath showed severe pulmonary Hypertension, markedly elevated PVR (9.3 - 10.5) and low cardiac output / index. HRCT scan shows unchanged fibrotic pulmonary sarcoidosis with bilateral segmental and subsegmental fibrosis post left upper lobectomy with unchanged apical right upper lobe and superior segment left lower lobe bullae.   -- Continue inhaled iloprost 5 mcg 6 times a day q4h; please do not mix with other nebulizer solutions   -- Plan to transition to Tyvaso nebs on discharge. Approval pending   -- [facility] rep to do teaching prior to DC    #Acute kidney injury on CKD3 (downtrending)  On admission patient's creatinine 1.3 which appears to be baseline. Cr fluctuating in 1.5-1.8 range.  -- Renally dose medications  -- Daily BMP    #Secondary Hyperparathyroidism, Hypocalcemia, and Hyperphosphatemia  Findings are consistent with secondary hyperparathyroidism due to CKD.  Ionized calcium: 4.37 mg/dL (low)  Phosphate: 5.6 mg/dL (elevated)  PTH: 161.0 pg/mL (elevated)  Vitamin D Total (25OH) 35  -- Reconsider need for future oral calcium carbonate 1600 mg TID  -- Reconsider need for future phoslo 667 mg PO TID   -- Reassess PTH levels in 4-6 weeks.    #Type 2 diabetes mellitus with hyperglycemia and hypoglycemia  Hemoglobin A1c 8.3%.  Home regimen includes glargine 18 units nightly and sliding scale insulin.  Large fluctuations in glucose greater than 300 and occasional values ~50.  -- Sliding scale insulin insulin sensitivity factor 40  --12 glargine nightly  -- 3 units lispro tid     #Left leg swelling (resolved )  Patient endorses intermittent left > right leg swelling. Venous doppler unremarkable.      Chronic Problems     #Chronic Pain   - Continuing home gabapentin   - PT/OT consulted, appreciate recs     #Chronic pulmonary aspergillosis  Patient has history of aspergilloma and is on chronic posaconazole. Soluble IL-2 receptor, Aspergillus Galactomannan Antigen, IgE checked 10/2022 all wnl  -Continue home posaconazole Ppx  -Outpatient evaluation for [facility] scheduled for 10/25 to evaluate recurrent hemoptysis     #Iron deficiency anemia  #Chronic normocytic anemia (baseline hemoglobin 11)  Patient has some mild normocytic anemia with hemoglobin of 11. Iron panel shows iron of 37 with iron saturation of 15.   --Iron supplementation with iron dextran 1,000mg  in 250cc    #Hyperlipidemia  Last lipid profile showed an LDL of 146  -- Follow-up outpatient    Issues Impacting Complexity of Management:  -The patient is at high risk for the development of complications of volume overload due to the need to provide IV hydration for suspected hypovolemia in the setting of: Pulmonary hypertension  -Need for the following intensive monitoring parameter(s) due to high risk of clinical decline: continuous oxygen monitoring, telemetry, and frequent monitoring of urine output to assess for the efficacy of diuretic regimen  -Need for intensive oxygen therapy of HFNC, which places the patient at high risk for oxygen toxicity  -The patient is at high risk of complications from pulmonary hypertension    Daily Checklist:  Diet: Regular Diet   DVT PPx: Lovenox 40mg  q24h  Electrolytes: Replete Potassium to >/= 3.6 and Magnesium to >/= 1.8  Code Status: DNR and DNI  Dispo:  Continue MPCU    Team Contact Information:   Primary Team: Infectious Disease ([facility])  Primary Resident: Ricarda Frame, MD  Resident's Pager: [phone] (Infect Disease Intern - Tower)    Interval History:   No  acute events overnight.     No complaints or concerns this morning. Continues to have some dyspnea with exertion, which has been present. Also with similar pedal edema. No chest pain, fever, chills.       ROS negative except for where otherwise noted above.    Objective:   Temp:  [36.4 ??C (97.5 ??F)-36.6 ??C (97.8 ??F)] 36.4 ??C (97.5 ??F)  Heart Rate:  [75-88] 80  SpO2 Pulse:  [32-88] 73  Resp:  [18-26] 23  BP: (92-120)/(57-82) 109/69  SpO2:  [92 %-100 %] 100 %,   Intake/Output Summary (Last 24 hours) at 12/22/2022 0836  Last data filed at 12/21/2022 1900  Gross per 24 hour   Intake 380 ml   Output 2200 ml   Net -1820 ml   , O2 Flow Rate (L/min):  [7 L/min-8 L/min] 8 L/min    Gen: NAD, converses, appears comfortable on large bore nasal cannula  HENT: atraumatic, normocephalic  Heart: RRR  Lungs: Diminished breath sounds in lower lung fields  Abdomen: soft, NTND  Extremities: continued 1+ pedal edema

## 2022-12-22 NOTE — Unmapped (Signed)
Problem: Adult Inpatient Plan of Care  Goal: Plan of Care Review  Outcome: Ongoing - Unchanged  Goal: Patient-Specific Goal (Individualized)  Outcome: Ongoing - Unchanged  Goal: Absence of Hospital-Acquired Illness or Injury  Outcome: Ongoing - Unchanged  Goal: Optimal Comfort and Wellbeing  Outcome: Ongoing - Unchanged  Goal: Readiness for Transition of Care  Outcome: Ongoing - Unchanged  Goal: Rounds/Family Conference  Outcome: Ongoing - Unchanged     Problem: Fall Injury Risk  Goal: Absence of Fall and Fall-Related Injury  Outcome: Ongoing - Unchanged     Problem: Gas Exchange Impaired  Goal: Optimal Gas Exchange  Outcome: Ongoing - Unchanged     Problem: Comorbidity Management  Goal: Blood Glucose Levels Within Targeted Range  Outcome: Ongoing - Unchanged  Goal: Blood Pressure in Desired Range  Outcome: Ongoing - Unchanged

## 2022-12-23 LAB — CBC
HEMATOCRIT: 32.2 % — ABNORMAL LOW (ref 34.0–44.0)
HEMOGLOBIN: 10.5 g/dL — ABNORMAL LOW (ref 11.3–14.9)
MEAN CORPUSCULAR HEMOGLOBIN CONC: 32.8 g/dL (ref 32.0–36.0)
MEAN CORPUSCULAR HEMOGLOBIN: 27.5 pg (ref 25.9–32.4)
MEAN CORPUSCULAR VOLUME: 83.9 fL (ref 77.6–95.7)
MEAN PLATELET VOLUME: 8.9 fL (ref 6.8–10.7)
PLATELET COUNT: 144 10*9/L — ABNORMAL LOW (ref 150–450)
RED BLOOD CELL COUNT: 3.83 10*12/L — ABNORMAL LOW (ref 3.95–5.13)
RED CELL DISTRIBUTION WIDTH: 17.8 % — ABNORMAL HIGH (ref 12.2–15.2)
WBC ADJUSTED: 3 10*9/L — ABNORMAL LOW (ref 3.6–11.2)

## 2022-12-23 LAB — BASIC METABOLIC PANEL
ANION GAP: 7 mmol/L (ref 5–14)
ANION GAP: 6 mmol/L (ref 5–14)
BLOOD UREA NITROGEN: 32 mg/dL — ABNORMAL HIGH (ref 9–23)
BLOOD UREA NITROGEN: 36 mg/dL — ABNORMAL HIGH (ref 9–23)
BUN / CREAT RATIO: 19
BUN / CREAT RATIO: 21
CALCIUM: 9.4 mg/dL (ref 8.7–10.4)
CALCIUM: 10.1 mg/dL (ref 8.7–10.4)
CHLORIDE: 101 mmol/L (ref 98–107)
CHLORIDE: 98 mmol/L (ref 98–107)
CO2: 32 mmol/L — ABNORMAL HIGH (ref 20.0–31.0)
CO2: 36 mmol/L — ABNORMAL HIGH (ref 20.0–31.0)
CREATININE: 1.7 mg/dL — ABNORMAL HIGH
CREATININE: 1.74 mg/dL — ABNORMAL HIGH
EGFR CKD-EPI (2021) FEMALE: 34 mL/min/{1.73_m2} — ABNORMAL LOW (ref >=60)
EGFR CKD-EPI (2021) FEMALE: 33 mL/min/{1.73_m2} — ABNORMAL LOW (ref >=60)
GLUCOSE RANDOM: 89 mg/dL (ref 70–179)
GLUCOSE RANDOM: 137 mg/dL (ref 70–179)
POTASSIUM: 4.1 mmol/L (ref 3.4–4.8)
POTASSIUM: 4.3 mmol/L (ref 3.4–4.8)
SODIUM: 140 mmol/L (ref 135–145)
SODIUM: 140 mmol/L (ref 135–145)

## 2022-12-23 LAB — LACTATE, VENOUS, WHOLE BLOOD: LACTATE BLOOD VENOUS: 1.3 mmol/L (ref 0.5–1.8)

## 2022-12-23 LAB — MAGNESIUM
MAGNESIUM: 2.1 mg/dL (ref 1.6–2.6)
MAGNESIUM: 2.2 mg/dL (ref 1.6–2.6)

## 2022-12-23 LAB — PRO-BNP: PRO-BNP: 6531 pg/mL — ABNORMAL HIGH (ref <=300.0)

## 2022-12-23 MED ADMIN — diclofenac sodium (VOLTAREN) 1 % gel 2 g: 2 g | TOPICAL | @ 22:00:00

## 2022-12-23 MED ADMIN — enoxaparin (LOVENOX) syringe 40 mg: 40 mg | SUBCUTANEOUS | @ 01:00:00

## 2022-12-23 MED ADMIN — arformoterol (BROVANA) nebulizer solution 15 mcg/2 mL: 15 ug | RESPIRATORY_TRACT | @ 01:00:00

## 2022-12-23 MED ADMIN — sodium chloride 3 % NEBULIZER solution 4 mL: 4 mL | RESPIRATORY_TRACT | @ 12:00:00

## 2022-12-23 MED ADMIN — budesonide (PULMICORT) nebulizer solution 0.5 mg: .5 mg | RESPIRATORY_TRACT | @ 01:00:00

## 2022-12-23 MED ADMIN — insulin glargine (LANTUS) injection 12 Units: 12 [IU] | SUBCUTANEOUS | @ 01:00:00

## 2022-12-23 MED ADMIN — ipratropium-albuterol (DUO-NEB) 0.5-2.5 mg/3 mL nebulizer solution 3 mL: 3 mL | RESPIRATORY_TRACT | @ 19:00:00

## 2022-12-23 MED ADMIN — furosemide (LASIX) injection 80 mg: 80 mg | INTRAVENOUS | @ 19:00:00 | Stop: 2022-12-23

## 2022-12-23 MED ADMIN — sildenafiL (pulm.hypertension) (REVATIO) tablet 40 mg: 40 mg | ORAL | @ 18:00:00

## 2022-12-23 MED ADMIN — iloprost (VENTAVIS) nebulizer solution: 5 ug | RESPIRATORY_TRACT | @ 01:00:00

## 2022-12-23 MED ADMIN — iloprost (VENTAVIS) nebulizer solution: 5 ug | RESPIRATORY_TRACT | @ 16:00:00

## 2022-12-23 MED ADMIN — ipratropium-albuterol (DUO-NEB) 0.5-2.5 mg/3 mL nebulizer solution 3 mL: 3 mL | RESPIRATORY_TRACT | @ 01:00:00

## 2022-12-23 MED ADMIN — azithromycin (ZITHROMAX) tablet 250 mg: 250 mg | ORAL | @ 14:00:00 | Stop: 2025-08-25

## 2022-12-23 MED ADMIN — sildenafiL (pulm.hypertension) (REVATIO) tablet 40 mg: 40 mg | ORAL | @ 01:00:00

## 2022-12-23 MED ADMIN — iloprost (VENTAVIS) nebulizer solution: 5 ug | RESPIRATORY_TRACT | @ 19:00:00

## 2022-12-23 MED ADMIN — diclofenac sodium (VOLTAREN) 1 % gel 2 g: 2 g | TOPICAL | @ 01:00:00

## 2022-12-23 MED ADMIN — ipratropium-albuterol (DUO-NEB) 0.5-2.5 mg/3 mL nebulizer solution 3 mL: 3 mL | RESPIRATORY_TRACT | @ 12:00:00

## 2022-12-23 MED ADMIN — insulin lispro (HumaLOG) injection 3 Units: 3 [IU] | SUBCUTANEOUS | @ 19:00:00

## 2022-12-23 MED ADMIN — diclofenac sodium (VOLTAREN) 1 % gel 2 g: 2 g | TOPICAL | @ 17:00:00

## 2022-12-23 MED ADMIN — sildenafiL (pulm.hypertension) (REVATIO) tablet 40 mg: 40 mg | ORAL | @ 14:00:00

## 2022-12-23 MED ADMIN — furosemide (LASIX) injection 80 mg: 80 mg | INTRAVENOUS | @ 17:00:00 | Stop: 2022-12-23

## 2022-12-23 MED ADMIN — melatonin tablet 6 mg: 6 mg | ORAL | @ 05:00:00

## 2022-12-23 MED ADMIN — magnesium oxide-Mg AA chelate (Magnesium Plus Protein) 2 tablet: 2 | ORAL | @ 01:00:00

## 2022-12-23 MED ADMIN — iloprost (VENTAVIS) nebulizer solution: 5 ug | RESPIRATORY_TRACT | @ 12:00:00

## 2022-12-23 MED ADMIN — budesonide (PULMICORT) nebulizer solution 0.5 mg: .5 mg | RESPIRATORY_TRACT | @ 12:00:00

## 2022-12-23 MED ADMIN — posaconazole (NOXAFIL) delayed released tablet 200 mg: 200 mg | ORAL | @ 14:00:00

## 2022-12-23 MED ADMIN — iloprost (VENTAVIS) nebulizer solution: 5 ug | RESPIRATORY_TRACT | @ 04:00:00

## 2022-12-23 MED ADMIN — magnesium oxide-Mg AA chelate (Magnesium Plus Protein) 2 tablet: 2 | ORAL | @ 14:00:00

## 2022-12-23 MED ADMIN — insulin lispro (HumaLOG) injection 0-20 Units: 0-20 [IU] | SUBCUTANEOUS | @ 01:00:00

## 2022-12-23 MED ADMIN — polyethylene glycol (MIRALAX) packet 17 g: 17 g | ORAL | @ 14:00:00

## 2022-12-23 MED ADMIN — iloprost (VENTAVIS) nebulizer solution: 5 ug | RESPIRATORY_TRACT | @ 22:00:00

## 2022-12-23 MED ADMIN — lidocaine (ASPERCREME) 4 % 1 patch: 1 | TRANSDERMAL | @ 14:00:00

## 2022-12-23 MED ADMIN — arformoterol (BROVANA) nebulizer solution 15 mcg/2 mL: 15 ug | RESPIRATORY_TRACT | @ 12:00:00

## 2022-12-23 NOTE — Unmapped (Signed)
Patient is alert and oriented, 8L large bore, uses a non-rebreather mask for exertion, compression stockings applied per pt request, BG 250 at bedtime, purposeful rounding maintained, no acute events overnight, plan of care ongoing.    Problem: Adult Inpatient Plan of Care  Goal: Plan of Care Review  Outcome: Progressing  Goal: Patient-Specific Goal (Individualized)  Outcome: Progressing  Goal: Absence of Hospital-Acquired Illness or Injury  Outcome: Progressing  Intervention: Prevent and Manage VTE (Venous Thromboembolism) Risk  Recent Flowsheet Documentation  Taken 12/22/2022 2000 by Deretha Emory, RN  VTE Prevention/Management: anticoagulant therapy  Goal: Optimal Comfort and Wellbeing  Outcome: Progressing  Goal: Readiness for Transition of Care  Outcome: Progressing  Goal: Rounds/Family Conference  Outcome: Progressing     Problem: Fall Injury Risk  Goal: Absence of Fall and Fall-Related Injury  Outcome: Progressing     Problem: Gas Exchange Impaired  Goal: Optimal Gas Exchange  Outcome: Progressing     Problem: Comorbidity Management  Goal: Blood Glucose Levels Within Targeted Range  Outcome: Progressing  Goal: Blood Pressure in Desired Range  Outcome: Progressing

## 2022-12-23 NOTE — Unmapped (Signed)
Infectious Disease ([facility]) Progress Note    Assessment & Plan:   Karen Barber is a 57 y.o. female whose presentation is complicated by bronchiectasis type 1 in the setting of pulmonary sarcoidosis (6L [place] home O2), HTN, T2DM, COPD, s/p LUL lobectomy, and asthma that presented to [facility] admitted to MICU after becoming hypoxic after holding home meds prior to PET scan. After 1 week diuresing ICU stabilized patient she was transferred to the MPCU.    Principal Problem:    Acute on chronic hypoxic respiratory failure (CMS-HCC)  Active Problems:    Pulmonary sarcoidosis (CMS-HCC)    Type 2 diabetes mellitus with hyperglycemia (CMS-HCC)    COPD (chronic obstructive pulmonary disease) (CMS-HCC)    Chronic pulmonary aspergillosis (CMS-HCC)    Pulmonary hypertension (CMS-HCC)    AKI (acute kidney injury) (CMS-HCC)    Active Problems  #Acute on Chronic Hypoxemic Respiratory Failure -   #Pulmonary Sarcoidosis -   #COPD/asthma -   #Bronchiectasis  Patient has notable history of pulmonary sarcoidosis, COPD, pulmonary hypertension and is on baseline oxygen 6 L.  Recent admission with pneumonia growing Serratia marcescens  s/p 1x Rocephin (8/18), Vancomycin (8/18-8/19), Zosyn (8/18-8/23) discharged on levofloxacin 500 mg ending on 8/31.  Patient held her medications only for the morning prior to an outpatient PET CT scan during which she became hypoxic to the 70s and was admitted to the MICU.  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 She received one dose of 125mg  of IV methylprednisolone. She was weaned from high flow nasal cannula to large bore nasal cannula after IV diuresis and a course of antibiotics for possible pneumonia with Vanco/cefepime later transition to levofloxacin with a total 1 week treatment with antibiotics. Started on vanc/cefepime for broad coverage --> stopped vanc and cefepime 9/12- then levaquin (9/12---9/16).  Patient was transferred to the floor on 9/18 after showing significant signs of improvement but still having occasional desaturations with ambulation. PET scan 9/18 is not indicative of active pulmonary sarcoidosis.  Acute exacerbation likely multifactorial including in not taking her home medications as per directed for airway clearance, volume overload, and higher O2 requirement chronically than the amount she has been using. RHC 9/20 showing significant pulm HTN disease with mean PA pressure of 47, for which pulmonology team started Iloprost. Notably, wedge pressure on that study was 5, which is reassuring against volume as driver of her respiratory status at that time. Patient with increasing O2 requirements and now requiring HFNC after saturating to mid-80s even with 8L and Venturi mask. patient is volume overloaded and will dose with IV Lasix.  -- Continue to wean O2 with goal saturations of 90-94% per pulmonology  -- Continue spot dosing Lasix 80 IV with net negative goal -1L  -- Standing weights daily  -- Plan to get oxymizer for her while admitted. Will need oxymizer at home  -- Continue home airway clearance (DuoNebs and HTS 3 times daily with [place])  -- Continue home Pulmicort, Brovana, Atrovent, as needed albuterol, and azithromycin  -- PT and OT consult  --S/p follow LR Cx     #Pulmonary hypertension - RV failure - orthostatic hypotension  Patient has history of pulmonary hypertension due to pulmonary sarcoidosis/COPD.  TTE this admission 9/17 with severely dilated right atrium with severe pulmonary hypertension PASP 86 mmHg.  Estimated right atrial pressure 5 to 10 mmHg.  On Lasix 80 mg twice daily at home.  Echo showed LVEF greater than 55% and severe pulmonary hypertension no significant change since  prior study.  Right heart cath showed severe pulmonary Hypertension, markedly elevated PVR (9.3 - 10.5) and low cardiac output / index. HRCT scan shows unchanged fibrotic pulmonary sarcoidosis with bilateral segmental and subsegmental fibrosis post left upper lobectomy with unchanged apical right upper lobe and superior segment left lower lobe bullae.   -- Continue inhaled iloprost 5 mcg 6 times a day q4h; please do not mix with other nebulizer solutions   -- Plan to transition to Tyvaso nebs on discharge.  Scheduled to arrive on 10/8, will trial inpatient  -- [facility] rep to do teaching prior to DC    #Acute kidney injury on CKD3 (downtrending)  On admission patient's creatinine 1.3 which appears to be baseline. Cr fluctuating in 1.5-1.8 range.  -- Renally dose medications  -- Daily BMP    #Secondary Hyperparathyroidism, Hypocalcemia, and Hyperphosphatemia  Findings are consistent with secondary hyperparathyroidism due to CKD.  Ionized calcium: 4.37 mg/dL (low)  Phosphate: 5.6 mg/dL (elevated)  PTH: 563.8 pg/mL (elevated)  Vitamin D Total (25OH) 35  -- Reconsider need for future oral calcium carbonate 1600 mg TID  -- Reconsider need for future phoslo 667 mg PO TID   -- Reassess PTH levels in 4-6 weeks.    #Type 2 diabetes mellitus with hyperglycemia and hypoglycemia  Hemoglobin A1c 8.3%.  Home regimen includes glargine 18 units nightly and sliding scale insulin.  Large fluctuations in glucose greater than 300 and occasional values ~50.  -- Sliding scale insulin insulin sensitivity factor 40  --12 glargine nightly  -- 3 units lispro tid     #Left leg swelling (resolved )  Patient endorses intermittent left > right leg swelling. Venous doppler unremarkable.      Chronic Problems     #Chronic Pain   - Continuing home gabapentin   - PT/OT consulted, appreciate recs     #Chronic pulmonary aspergillosis  Patient has history of aspergilloma and is on chronic posaconazole. Soluble IL-2 receptor, Aspergillus Galactomannan Antigen, IgE checked 10/2022 all wnl  -Continue home posaconazole Ppx  -Outpatient evaluation for [facility] scheduled for 10/25 to evaluate recurrent hemoptysis     #Iron deficiency anemia  #Chronic normocytic anemia (baseline hemoglobin 11)  Patient has some mild normocytic anemia with hemoglobin of 11. Iron panel shows iron of 37 with iron saturation of 15.   --Iron supplementation with iron dextran 1,000mg  in 250cc    #Hyperlipidemia  Last lipid profile showed an LDL of 146  -- Follow-up outpatient    Issues Impacting Complexity of Management:  -The patient is at high risk for the development of complications of volume overload due to the need to provide IV hydration for suspected hypovolemia in the setting of: Pulmonary hypertension  -Need for the following intensive monitoring parameter(s) due to high risk of clinical decline: continuous oxygen monitoring, telemetry, and frequent monitoring of urine output to assess for the efficacy of diuretic regimen  -Need for intensive oxygen therapy of HFNC, which places the patient at high risk for oxygen toxicity  -The patient is at high risk of complications from pulmonary hypertension    Daily Checklist:  Diet: Regular Diet   DVT PPx: Lovenox 40mg  q24h  Electrolytes: Replete Potassium to >/= 3.6 and Magnesium to >/= 1.8  Code Status: DNR and DNI  Dispo:  Continue MPCU    Team Contact Information:   Primary Team: Infectious Disease ([facility])  Primary Resident: Zella Richer, MD  Resident's Pager: [phone] (Infect Disease Intern - Tower)    Interval History:  No acute events overnight.     No complaints or concerns this morning.  Continues to have mild shortness of breath with exertion that resolves with nonrebreather and rest.  Notes presence of lower extremity edema, but denies any chest pain fevers or chills.    ROS negative except for where otherwise noted above.    Objective:   Temp:  [35.7 ??C (96.3 ??F)-36.8 ??C (98.2 ??F)] 36.3 ??C (97.3 ??F)  Heart Rate:  [76-100] 76  SpO2 Pulse:  [78-96] 78  Resp:  [16-30] 18  BP: (93-122)/(67-82) 115/81  SpO2:  [93 %-100 %] 98 %,   Intake/Output Summary (Last 24 hours) at 12/23/2022 1216  Last data filed at 12/22/2022 2345  Gross per 24 hour   Intake --   Output 1500 ml   Net -1500 ml   , O2 Device: Large bore nasal cannula (cool high flow)  O2 Flow Rate (L/min):  [8 L/min] 8 L/min    Gen: NAD, converses, appears comfortable on large bore nasal cannula  HENT: atraumatic, normocephalic  Heart: RRR  Lungs: Diminished breath sounds lower lung fields  Abdomen: Soft, NTND  Extremities: Continued 1+ lower extremity edema

## 2022-12-23 NOTE — Unmapped (Signed)
Problem: Adult Inpatient Plan of Care  Goal: Plan of Care Review  Outcome: Progressing  Goal: Patient-Specific Goal (Individualized)  Outcome: Progressing  Goal: Absence of Hospital-Acquired Illness or Injury  Outcome: Progressing  Goal: Optimal Comfort and Wellbeing  Outcome: Progressing  Goal: Readiness for Transition of Care  Outcome: Progressing  Goal: Rounds/Family Conference  Outcome: Progressing     Problem: Fall Injury Risk  Goal: Absence of Fall and Fall-Related Injury  Outcome: Progressing     Problem: Gas Exchange Impaired  Goal: Optimal Gas Exchange  Outcome: Progressing     Problem: Comorbidity Management  Goal: Blood Glucose Levels Within Targeted Range  Outcome: Progressing  Goal: Blood Pressure in Desired Range  Outcome: Progressing

## 2022-12-24 LAB — BASIC METABOLIC PANEL
ANION GAP: 10 mmol/L (ref 5–14)
ANION GAP: 6 mmol/L (ref 5–14)
BLOOD UREA NITROGEN: 35 mg/dL — ABNORMAL HIGH (ref 9–23)
BLOOD UREA NITROGEN: 36 mg/dL — ABNORMAL HIGH (ref 9–23)
BUN / CREAT RATIO: 20
BUN / CREAT RATIO: 21
CALCIUM: 9.7 mg/dL (ref 8.7–10.4)
CALCIUM: 9.7 mg/dL (ref 8.7–10.4)
CHLORIDE: 100 mmol/L (ref 98–107)
CHLORIDE: 98 mmol/L (ref 98–107)
CO2: 30 mmol/L (ref 20.0–31.0)
CO2: 34 mmol/L — ABNORMAL HIGH (ref 20.0–31.0)
CREATININE: 1.74 mg/dL — ABNORMAL HIGH
CREATININE: 1.7 mg/dL — ABNORMAL HIGH
EGFR CKD-EPI (2021) FEMALE: 33 mL/min/{1.73_m2} — ABNORMAL LOW (ref >=60)
EGFR CKD-EPI (2021) FEMALE: 34 mL/min/{1.73_m2} — ABNORMAL LOW (ref >=60)
GLUCOSE RANDOM: 118 mg/dL (ref 70–179)
GLUCOSE RANDOM: 158 mg/dL (ref 70–179)
POTASSIUM: 4.1 mmol/L (ref 3.4–4.8)
POTASSIUM: 4.9 mmol/L — ABNORMAL HIGH (ref 3.4–4.8)
SODIUM: 140 mmol/L (ref 135–145)
SODIUM: 138 mmol/L (ref 135–145)

## 2022-12-24 LAB — CBC
HEMATOCRIT: 32 % — ABNORMAL LOW (ref 34.0–44.0)
HEMOGLOBIN: 10.7 g/dL — ABNORMAL LOW (ref 11.3–14.9)
MEAN CORPUSCULAR HEMOGLOBIN CONC: 33.4 g/dL (ref 32.0–36.0)
MEAN CORPUSCULAR HEMOGLOBIN: 27.8 pg (ref 25.9–32.4)
MEAN CORPUSCULAR VOLUME: 83.3 fL (ref 77.6–95.7)
MEAN PLATELET VOLUME: 8.6 fL (ref 6.8–10.7)
PLATELET COUNT: 152 10*9/L (ref 150–450)
RED BLOOD CELL COUNT: 3.84 10*12/L — ABNORMAL LOW (ref 3.95–5.13)
RED CELL DISTRIBUTION WIDTH: 17.6 % — ABNORMAL HIGH (ref 12.2–15.2)
WBC ADJUSTED: 2.7 10*9/L — ABNORMAL LOW (ref 3.6–11.2)

## 2022-12-24 LAB — MAGNESIUM
MAGNESIUM: 2.2 mg/dL (ref 1.6–2.6)
MAGNESIUM: 2.2 mg/dL (ref 1.6–2.6)

## 2022-12-24 LAB — LACTATE, VENOUS, WHOLE BLOOD: LACTATE BLOOD VENOUS: 1.2 mmol/L (ref 0.5–1.8)

## 2022-12-24 LAB — PRO-BNP: PRO-BNP: 5272 pg/mL — ABNORMAL HIGH (ref <=300.0)

## 2022-12-24 MED ADMIN — acetaminophen (TYLENOL) tablet 650 mg: 650 mg | ORAL | @ 01:00:00

## 2022-12-24 MED ADMIN — ondansetron (ZOFRAN) injection 4 mg: 4 mg | INTRAVENOUS

## 2022-12-24 MED ADMIN — iloprost (VENTAVIS) nebulizer solution: 5 ug | RESPIRATORY_TRACT | @ 12:00:00

## 2022-12-24 MED ADMIN — iloprost (VENTAVIS) nebulizer solution: 5 ug | RESPIRATORY_TRACT | @ 22:00:00

## 2022-12-24 MED ADMIN — budesonide (PULMICORT) nebulizer solution 0.5 mg: .5 mg | RESPIRATORY_TRACT | @ 01:00:00

## 2022-12-24 MED ADMIN — sodium chloride 3 % NEBULIZER solution 4 mL: 4 mL | RESPIRATORY_TRACT | @ 01:00:00

## 2022-12-24 MED ADMIN — insulin lispro (HumaLOG) injection 3 Units: 3 [IU] | SUBCUTANEOUS | @ 15:00:00

## 2022-12-24 MED ADMIN — furosemide (LASIX) tablet 80 mg: 80 mg | ORAL | @ 17:00:00

## 2022-12-24 MED ADMIN — enoxaparin (LOVENOX) syringe 40 mg: 40 mg | SUBCUTANEOUS | @ 01:00:00

## 2022-12-24 MED ADMIN — budesonide (PULMICORT) nebulizer solution 0.5 mg: .5 mg | RESPIRATORY_TRACT | @ 12:00:00

## 2022-12-24 MED ADMIN — sildenafiL (pulm.hypertension) (REVATIO) tablet 40 mg: 40 mg | ORAL | @ 12:00:00

## 2022-12-24 MED ADMIN — arformoterol (BROVANA) nebulizer solution 15 mcg/2 mL: 15 ug | RESPIRATORY_TRACT | @ 01:00:00

## 2022-12-24 MED ADMIN — iloprost (VENTAVIS) nebulizer solution: 5 ug | RESPIRATORY_TRACT | @ 19:00:00

## 2022-12-24 MED ADMIN — sildenafiL (pulm.hypertension) (REVATIO) tablet 40 mg: 40 mg | ORAL | @ 17:00:00

## 2022-12-24 MED ADMIN — acetaminophen (TYLENOL) tablet 650 mg: 650 mg | ORAL | @ 20:00:00

## 2022-12-24 MED ADMIN — azithromycin (ZITHROMAX) tablet 250 mg: 250 mg | ORAL | @ 12:00:00 | Stop: 2025-08-25

## 2022-12-24 MED ADMIN — insulin lispro (HumaLOG) injection 3 Units: 3 [IU] | SUBCUTANEOUS | @ 21:00:00

## 2022-12-24 MED ADMIN — iloprost (VENTAVIS) nebulizer solution: 5 ug | RESPIRATORY_TRACT | @ 17:00:00

## 2022-12-24 MED ADMIN — magnesium oxide-Mg AA chelate (Magnesium Plus Protein) 2 tablet: 2 | ORAL | @ 12:00:00

## 2022-12-24 MED ADMIN — insulin glargine (LANTUS) injection 12 Units: 12 [IU] | SUBCUTANEOUS | @ 01:00:00

## 2022-12-24 MED ADMIN — furosemide (LASIX) tablet 80 mg: 80 mg | ORAL | @ 10:00:00

## 2022-12-24 MED ADMIN — arformoterol (BROVANA) nebulizer solution 15 mcg/2 mL: 15 ug | RESPIRATORY_TRACT | @ 12:00:00

## 2022-12-24 MED ADMIN — iloprost (VENTAVIS) nebulizer solution: 5 ug | RESPIRATORY_TRACT | @ 04:00:00

## 2022-12-24 MED ADMIN — magnesium oxide-Mg AA chelate (Magnesium Plus Protein) 2 tablet: 2 | ORAL | @ 01:00:00

## 2022-12-24 MED ADMIN — sildenafiL (pulm.hypertension) (REVATIO) tablet 40 mg: 40 mg | ORAL | @ 01:00:00

## 2022-12-24 MED ADMIN — ipratropium-albuterol (DUO-NEB) 0.5-2.5 mg/3 mL nebulizer solution 3 mL: 3 mL | RESPIRATORY_TRACT | @ 12:00:00

## 2022-12-24 MED ADMIN — ipratropium-albuterol (DUO-NEB) 0.5-2.5 mg/3 mL nebulizer solution 3 mL: 3 mL | RESPIRATORY_TRACT | @ 17:00:00

## 2022-12-24 MED ADMIN — sodium chloride 3 % NEBULIZER solution 4 mL: 4 mL | RESPIRATORY_TRACT | @ 12:00:00

## 2022-12-24 MED ADMIN — posaconazole (NOXAFIL) delayed released tablet 200 mg: 200 mg | ORAL | @ 12:00:00

## 2022-12-24 MED ADMIN — iloprost (VENTAVIS) nebulizer solution: 5 ug | RESPIRATORY_TRACT | @ 01:00:00

## 2022-12-24 MED ADMIN — diclofenac sodium (VOLTAREN) 1 % gel 2 g: 2 g | TOPICAL | @ 10:00:00

## 2022-12-24 MED ADMIN — diclofenac sodium (VOLTAREN) 1 % gel 2 g: 2 g | TOPICAL | @ 01:00:00

## 2022-12-24 MED ADMIN — ipratropium-albuterol (DUO-NEB) 0.5-2.5 mg/3 mL nebulizer solution 3 mL: 3 mL | RESPIRATORY_TRACT | @ 01:00:00

## 2022-12-24 NOTE — Unmapped (Signed)
Problem: Adult Inpatient Plan of Care  Goal: Plan of Care Review  Outcome: Ongoing - Unchanged  Goal: Patient-Specific Goal (Individualized)  Outcome: Ongoing - Unchanged  Goal: Absence of Hospital-Acquired Illness or Injury  Outcome: Ongoing - Unchanged  Intervention: Identify and Manage Fall Risk  Recent Flowsheet Documentation  Taken 12/23/2022 2000 by Chesley Noon, RN  Safety Interventions:   aspiration precautions   bed alarm   bleeding precautions   fall reduction program maintained   infection management  Intervention: Prevent Skin Injury  Recent Flowsheet Documentation  Taken 12/24/2022 0000 by Chesley Noon, RN  Positioning for Skin: (Patient is independent with turns)   Right   Left  Taken 12/23/2022 2200 by Chesley Noon, RN  Positioning for Skin: (Patient is independent with turns)   Right   Left  Taken 12/23/2022 2000 by Chesley Noon, RN  Positioning for Skin: (Patient is independent with turns)   Right   Left  Intervention: Prevent and Manage VTE (Venous Thromboembolism) Risk  Recent Flowsheet Documentation  Taken 12/23/2022 2000 by Chesley Noon, RN  VTE Prevention/Management:   anticoagulant therapy   ambulation promoted  Intervention: Prevent Infection  Recent Flowsheet Documentation  Taken 12/23/2022 2000 by Chesley Noon, RN  Infection Prevention:   environmental surveillance performed   equipment surfaces disinfected   hand hygiene promoted   personal protective equipment utilized   rest/sleep promoted   single patient room provided  Goal: Optimal Comfort and Wellbeing  Outcome: Ongoing - Unchanged  Goal: Readiness for Transition of Care  Outcome: Ongoing - Unchanged  Goal: Rounds/Family Conference  Outcome: Ongoing - Unchanged     Problem: Fall Injury Risk  Goal: Absence of Fall and Fall-Related Injury  Outcome: Ongoing - Unchanged  Intervention: Promote Injury-Free Environment  Recent Flowsheet Documentation  Taken 12/23/2022 2000 by Chesley Noon, RN  Safety Interventions: aspiration precautions   bed alarm   bleeding precautions   fall reduction program maintained   infection management     Problem: Gas Exchange Impaired  Goal: Optimal Gas Exchange  Outcome: Ongoing - Unchanged  Intervention: Optimize Oxygenation and Ventilation  Recent Flowsheet Documentation  Taken 12/23/2022 2000 by Chesley Noon, RN  Head of Bed [facility]) Positioning: HOB at 45 degrees  Taken 12/23/2022 1930 by Chesley Noon, RN  Head of Bed [facility]) Positioning: HOB at 30-45 degrees     Problem: Comorbidity Management  Goal: Blood Glucose Levels Within Targeted Range  Outcome: Ongoing - Unchanged  Intervention: Monitor and Manage Glycemia  Recent Flowsheet Documentation  Taken 12/23/2022 2000 by Chesley Noon, RN  Glycemic Management: blood glucose monitored  Goal: Blood Pressure in Desired Range  Outcome: Ongoing - Unchanged

## 2022-12-24 NOTE — Unmapped (Signed)
Pt is A&Ox4. NSR with HR 80s. Bps stable with systolic's 90s-110s. O2 sats stable on 6L LBNC and nonrebreather when needed. Afebrile.  c/o of a headache and prn tylenol was given. UO adequate via purewick, no BM this shift. Pt's appetite has been adequate. Skin intact, Pt remained in the chair throughout the day and standard precautions maintained. No falls/injuries this shift. On SQ Lovenox for DVT prophylaxis. All monitors with appropriate alarm settings.     Problem: Comorbidity Management  Goal: Blood Glucose Levels Within Targeted Range  Outcome: Ongoing - Unchanged     Problem: Adult Inpatient Plan of Care  Goal: Plan of Care Review  Outcome: Ongoing - Unchanged  Goal: Patient-Specific Goal (Individualized)  Outcome: Ongoing - Unchanged  Goal: Absence of Hospital-Acquired Illness or Injury  Outcome: Ongoing - Unchanged  Intervention: Identify and Manage Fall Risk  Recent Flowsheet Documentation  Taken 12/24/2022 0800 by Linward Foster, RN  Safety Interventions:   aspiration precautions   commode/urinal/bedpan at bedside   environmental modification   fall reduction program maintained   infection management   lighting adjusted for tasks/safety   low bed   nonskid shoes/slippers when out of bed  Intervention: Prevent Skin Injury  Recent Flowsheet Documentation  Taken 12/24/2022 1600 by Linward Foster, RN  Positioning for Skin: Sitting in Chair  Taken 12/24/2022 1400 by Linward Foster, RN  Positioning for Skin: Sitting in Chair  Taken 12/24/2022 1200 by Linward Foster, RN  Positioning for Skin: Sitting in Chair  Taken 12/24/2022 0800 by Linward Foster, RN  Positioning for Skin: Sitting in Chair  Intervention: Prevent Infection  Recent Flowsheet Documentation  Taken 12/24/2022 0800 by Linward Foster, RN  Infection Prevention:   cohorting utilized   hand hygiene promoted   personal protective equipment utilized   rest/sleep promoted     Problem: Fall Injury Risk  Goal: Absence of Fall and Fall-Related Injury  Outcome: Ongoing - Unchanged  Intervention: Promote Scientist, clinical (histocompatibility and immunogenetics) Documentation  Taken 12/24/2022 0800 by Linward Foster, RN  Safety Interventions:   aspiration precautions   commode/urinal/bedpan at bedside   environmental modification   fall reduction program maintained   infection management   lighting adjusted for tasks/safety   low bed   nonskid shoes/slippers when out of bed

## 2022-12-24 NOTE — Unmapped (Signed)
Infectious Disease ([facility]) Progress Note    Assessment & Plan:   Karen Barber is a 57 y.o. female whose presentation is complicated by bronchiectasis type 1 in the setting of pulmonary sarcoidosis (6L [place] home O2), HTN, T2DM, COPD, s/p LUL lobectomy, and asthma that presented to [facility] admitted to MICU after becoming hypoxic after holding home meds prior to PET scan. After 1 week diuresing ICU stabilized patient she was transferred to the MPCU.    Principal Problem:    Acute on chronic hypoxic respiratory failure (CMS-HCC)  Active Problems:    Pulmonary sarcoidosis (CMS-HCC)    Type 2 diabetes mellitus with hyperglycemia (CMS-HCC)    COPD (chronic obstructive pulmonary disease) (CMS-HCC)    Chronic pulmonary aspergillosis (CMS-HCC)    Pulmonary hypertension (CMS-HCC)    AKI (acute kidney injury) (CMS-HCC)    Active Problems  #Acute on Chronic Hypoxemic Respiratory Failure -   #Pulmonary Sarcoidosis -   #COPD/asthma -   #Bronchiectasis  Patient has notable history of pulmonary sarcoidosis, COPD, pulmonary hypertension and is on baseline oxygen 6 L.  Recent admission with pneumonia growing Serratia marcescens  s/p 1x Rocephin (8/18), Vancomycin (8/18-8/19), Zosyn (8/18-8/23) discharged on levofloxacin 500 mg ending on 8/31.  Patient held her medications only for the morning prior to an outpatient PET CT scan during which she became hypoxic to the 70s and was admitted to the MICU.  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 She received one dose of 125mg  of IV methylprednisolone. She was weaned from high flow nasal cannula to large bore nasal cannula after IV diuresis and a course of antibiotics for possible pneumonia with Vanco/cefepime later transition to levofloxacin with a total 1 week treatment with antibiotics. Started on vanc/cefepime for broad coverage --> stopped vanc and cefepime 9/12- then levaquin (9/12---9/16).  Patient was transferred to the floor on 9/18 after showing significant signs of improvement but still having occasional desaturations with ambulation. PET scan 9/18 is not indicative of active pulmonary sarcoidosis.  Acute exacerbation likely multifactorial including in not taking her home medications as per directed for airway clearance, volume overload, and higher O2 requirement chronically than the amount she has been using. RHC 9/20 showing significant pulm HTN disease with mean PA pressure of 47, for which pulmonology team started Iloprost. Notably, wedge pressure on that study was 5, which is reassuring against volume as driver of her respiratory status at that time. Patient has been improving in her O2 requirement, now at her home oxygen rate. Will transition back to home PO lasix dose to assess management with oral regimen.  -- Continue to wean O2 with goal saturations of 90-94% per pulmonology  -- Trialing PO Lasix 80 BID for maintenance coverage  -- Plan to get oxymizer for her while admitted. Will need oxymizer at home  -- Continue home airway clearance (DuoNebs and HTS 3 times daily with [place])  -- Continue home Pulmicort, Brovana, Atrovent, as needed albuterol, and azithromycin  -- PT and OT consult  -- Standing weights daily     #Pulmonary hypertension - RV failure - orthostatic hypotension  Patient has history of pulmonary hypertension due to pulmonary sarcoidosis/COPD.  TTE this admission 9/17 with severely dilated right atrium with severe pulmonary hypertension PASP 86 mmHg.  Estimated right atrial pressure 5 to 10 mmHg.  On Lasix 80 mg twice daily at home.  Echo showed LVEF greater than 55% and severe pulmonary hypertension no significant change since prior study.  Right heart cath showed severe pulmonary  Hypertension, markedly elevated PVR (9.3 - 10.5) and low cardiac output / index. HRCT scan shows unchanged fibrotic pulmonary sarcoidosis with bilateral segmental and subsegmental fibrosis post left upper lobectomy with unchanged apical right upper lobe and superior segment left lower lobe bullae.   -- Continue inhaled iloprost 5 mcg 6 times a day q4h; please do not mix with other nebulizer solutions   -- Plan to transition to Tyvaso nebs on discharge.  Scheduled to arrive on 10/8, will trial inpatient  -- [facility] rep to do teaching prior to DC    #Acute kidney injury on CKD3 (downtrending)  On admission patient's creatinine 1.3 which appears to be baseline. Cr fluctuating in 1.5-1.8 range.  -- Renally dose medications  -- Daily BMP    #Secondary Hyperparathyroidism, Hypocalcemia, and Hyperphosphatemia  Findings are consistent with secondary hyperparathyroidism due to CKD.  Ionized calcium: 4.37 mg/dL (low)  Phosphate: 5.6 mg/dL (elevated)  PTH: 413.2 pg/mL (elevated)  Vitamin D Total (25OH) 35  -- Reconsider need for future oral calcium carbonate 1600 mg TID  -- Reconsider need for future phoslo 667 mg PO TID   -- Reassess PTH levels in 4-6 weeks.    #Type 2 diabetes mellitus with hyperglycemia and hypoglycemia  Hemoglobin A1c 8.3%.  Home regimen includes glargine 18 units nightly and sliding scale insulin.  Large fluctuations in glucose greater than 300 and occasional values ~50.  -- Sliding scale insulin insulin sensitivity factor 40  --12 glargine nightly  -- 3 units lispro tid     #Left leg swelling (resolved )  Patient endorses intermittent left > right leg swelling. Venous doppler unremarkable.      Chronic Problems     #Chronic Pain   - Continuing home gabapentin   - PT/OT consulted, appreciate recs     #Chronic pulmonary aspergillosis  Patient has history of aspergilloma and is on chronic posaconazole. Soluble IL-2 receptor, Aspergillus Galactomannan Antigen, IgE checked 10/2022 all wnl  -Continue home posaconazole Ppx  -Outpatient evaluation for [facility] scheduled for 10/25 to evaluate recurrent hemoptysis     #Iron deficiency anemia  #Chronic normocytic anemia (baseline hemoglobin 11)  Patient has some mild normocytic anemia with hemoglobin of 11. Iron panel shows iron of 37 with iron saturation of 15.   --Iron supplementation with iron dextran 1,000mg  in 250cc    #Hyperlipidemia  Last lipid profile showed an LDL of 146  -- Follow-up outpatient    Issues Impacting Complexity of Management:  -The patient is at high risk for the development of complications of volume overload due to the need to provide IV hydration for suspected hypovolemia in the setting of: Pulmonary hypertension  -Need for the following intensive monitoring parameter(s) due to high risk of clinical decline: continuous oxygen monitoring, telemetry, and frequent monitoring of urine output to assess for the efficacy of diuretic regimen  -Need for intensive oxygen therapy of HFNC, which places the patient at high risk for oxygen toxicity  -The patient is at high risk of complications from pulmonary hypertension    Daily Checklist:  Diet: Regular Diet   DVT PPx: Lovenox 40mg  q24h  Electrolytes: Replete Potassium to >/= 3.6 and Magnesium to >/= 1.8  Code Status: DNR and DNI  Dispo:  Continue MPCU    Team Contact Information:   Primary Team: Infectious Disease ([facility])  Primary Resident: Zella Richer, MD  Resident's Pager: [phone] (Infect Disease Intern - Tower)    Interval History:   No acute events overnight.  No complaints or concerns this morning.  Continues to have mild shortness of breath with exertion that resolves with nonrebreather and rest. Notes presence of lower extremity edema, however denies any chest pain, fevers, or worsening coughs.    ROS negative except for where otherwise noted above.    Objective:   Temp:  [36.1 ??C (97 ??F)-36.7 ??C (98.1 ??F)] 36.1 ??C (97 ??F)  Heart Rate:  [76-88] 81  SpO2 Pulse:  [76-88] 82  Resp:  [17-25] 17  BP: (95-112)/(66-82) 102/79  FiO2 (%):  [45 %] 45 %  SpO2:  [82 %-100 %] 91 %,   Intake/Output Summary (Last 24 hours) at 12/24/2022 0852  Last data filed at 12/24/2022 0600  Gross per 24 hour   Intake 286 ml   Output 900 ml   Net -614 ml   , FiO2 (%):  [45 %] 45 %  O2 Device: Large bore nasal cannula (cool high flow)  O2 Flow Rate (L/min):  [6 L/min] 6 L/min    Gen: NAD, converses well  HENT: Atraumatic, normocephalic  Heart: RRR  Lungs: Decreased breath sounds bilaterally on lower lung fields  Abdomen: Soft, NTND  Extremities: Continued 1+ pedal edema, improved lower leg exam

## 2022-12-25 LAB — LACTATE, VENOUS, WHOLE BLOOD: LACTATE BLOOD VENOUS: 1.5 mmol/L (ref 0.5–1.8)

## 2022-12-25 LAB — PHOSPHORUS: PHOSPHORUS: 5.3 mg/dL — ABNORMAL HIGH (ref 2.4–5.1)

## 2022-12-25 LAB — BASIC METABOLIC PANEL
ANION GAP: 7 mmol/L (ref 5–14)
ANION GAP: 6 mmol/L (ref 5–14)
BLOOD UREA NITROGEN: 34 mg/dL — ABNORMAL HIGH (ref 9–23)
BLOOD UREA NITROGEN: 41 mg/dL — ABNORMAL HIGH (ref 9–23)
BUN / CREAT RATIO: 17
BUN / CREAT RATIO: 20
CALCIUM: 9.8 mg/dL (ref 8.7–10.4)
CALCIUM: 9.7 mg/dL (ref 8.7–10.4)
CHLORIDE: 98 mmol/L (ref 98–107)
CHLORIDE: 98 mmol/L (ref 98–107)
CO2: 32 mmol/L — ABNORMAL HIGH (ref 20.0–31.0)
CO2: 32 mmol/L — ABNORMAL HIGH (ref 20.0–31.0)
CREATININE: 1.99 mg/dL — ABNORMAL HIGH
CREATININE: 2.06 mg/dL — ABNORMAL HIGH
EGFR CKD-EPI (2021) FEMALE: 28 mL/min/{1.73_m2} — ABNORMAL LOW (ref >=60)
EGFR CKD-EPI (2021) FEMALE: 27 mL/min/{1.73_m2} — ABNORMAL LOW (ref >=60)
GLUCOSE RANDOM: 149 mg/dL (ref 70–179)
GLUCOSE RANDOM: 187 mg/dL — ABNORMAL HIGH (ref 70–179)
POTASSIUM: 4.7 mmol/L (ref 3.4–4.8)
POTASSIUM: 5.2 mmol/L — ABNORMAL HIGH (ref 3.4–4.8)
SODIUM: 137 mmol/L (ref 135–145)
SODIUM: 136 mmol/L (ref 135–145)

## 2022-12-25 LAB — CBC W/ AUTO DIFF
BASOPHILS ABSOLUTE COUNT: 0 10*9/L (ref 0.0–0.1)
BASOPHILS RELATIVE PERCENT: 1 %
EOSINOPHILS ABSOLUTE COUNT: 0.2 10*9/L (ref 0.0–0.5)
EOSINOPHILS RELATIVE PERCENT: 6.5 %
HEMATOCRIT: 34.3 % (ref 34.0–44.0)
HEMOGLOBIN: 11.4 g/dL (ref 11.3–14.9)
LYMPHOCYTES ABSOLUTE COUNT: 0.6 10*9/L — ABNORMAL LOW (ref 1.1–3.6)
LYMPHOCYTES RELATIVE PERCENT: 19.3 %
MEAN CORPUSCULAR HEMOGLOBIN CONC: 33.3 g/dL (ref 32.0–36.0)
MEAN CORPUSCULAR HEMOGLOBIN: 27.9 pg (ref 25.9–32.4)
MEAN CORPUSCULAR VOLUME: 83.8 fL (ref 77.6–95.7)
MEAN PLATELET VOLUME: 8.5 fL (ref 6.8–10.7)
MONOCYTES ABSOLUTE COUNT: 0.5 10*9/L (ref 0.3–0.8)
MONOCYTES RELATIVE PERCENT: 15.2 %
NEUTROPHILS ABSOLUTE COUNT: 1.8 10*9/L (ref 1.8–7.8)
NEUTROPHILS RELATIVE PERCENT: 58 %
PLATELET COUNT: 133 10*9/L — ABNORMAL LOW (ref 150–450)
RED BLOOD CELL COUNT: 4.09 10*12/L (ref 3.95–5.13)
RED CELL DISTRIBUTION WIDTH: 17.2 % — ABNORMAL HIGH (ref 12.2–15.2)
WBC ADJUSTED: 3.1 10*9/L — ABNORMAL LOW (ref 3.6–11.2)

## 2022-12-25 LAB — MAGNESIUM
MAGNESIUM: 2.3 mg/dL (ref 1.6–2.6)
MAGNESIUM: 2.3 mg/dL (ref 1.6–2.6)
MAGNESIUM: 2.2 mg/dL (ref 1.6–2.6)

## 2022-12-25 LAB — CBC
HEMATOCRIT: 31.5 % — ABNORMAL LOW (ref 34.0–44.0)
HEMOGLOBIN: 10.4 g/dL — ABNORMAL LOW (ref 11.3–14.9)
MEAN CORPUSCULAR HEMOGLOBIN CONC: 33 g/dL (ref 32.0–36.0)
MEAN CORPUSCULAR HEMOGLOBIN: 27.6 pg (ref 25.9–32.4)
MEAN CORPUSCULAR VOLUME: 83.7 fL (ref 77.6–95.7)
MEAN PLATELET VOLUME: 9.4 fL (ref 6.8–10.7)
PLATELET COUNT: 140 10*9/L — ABNORMAL LOW (ref 150–450)
RED BLOOD CELL COUNT: 3.77 10*12/L — ABNORMAL LOW (ref 3.95–5.13)
RED CELL DISTRIBUTION WIDTH: 17.3 % — ABNORMAL HIGH (ref 12.2–15.2)
WBC ADJUSTED: 3.4 10*9/L — ABNORMAL LOW (ref 3.6–11.2)

## 2022-12-25 LAB — COMPREHENSIVE METABOLIC PANEL
ALBUMIN: 3.6 g/dL (ref 3.4–5.0)
ALKALINE PHOSPHATASE: 91 U/L (ref 46–116)
ALT (SGPT): 20 U/L (ref 10–49)
ANION GAP: 6 mmol/L (ref 5–14)
AST (SGOT): 27 U/L (ref <=34)
BILIRUBIN TOTAL: 0.7 mg/dL (ref 0.3–1.2)
BLOOD UREA NITROGEN: 34 mg/dL — ABNORMAL HIGH (ref 9–23)
BUN / CREAT RATIO: 17
CALCIUM: 9.9 mg/dL (ref 8.7–10.4)
CHLORIDE: 99 mmol/L (ref 98–107)
CO2: 33 mmol/L — ABNORMAL HIGH (ref 20.0–31.0)
CREATININE: 1.96 mg/dL — ABNORMAL HIGH
EGFR CKD-EPI (2021) FEMALE: 29 mL/min/{1.73_m2} — ABNORMAL LOW (ref >=60)
GLUCOSE RANDOM: 172 mg/dL (ref 70–179)
POTASSIUM: 4.8 mmol/L (ref 3.4–4.8)
PROTEIN TOTAL: 7.3 g/dL (ref 5.7–8.2)
SODIUM: 138 mmol/L (ref 135–145)

## 2022-12-25 LAB — PRO-BNP: PRO-BNP: 6722 pg/mL — ABNORMAL HIGH (ref <=300.0)

## 2022-12-25 MED ADMIN — iloprost (VENTAVIS) nebulizer solution: 5 ug | RESPIRATORY_TRACT | @ 22:00:00

## 2022-12-25 MED ADMIN — gabapentin (NEURONTIN) capsule 100 mg: 100 mg | ORAL | @ 15:00:00

## 2022-12-25 MED ADMIN — gabapentin (NEURONTIN) capsule 100 mg: 100 mg | ORAL | @ 20:00:00

## 2022-12-25 MED ADMIN — diclofenac sodium (VOLTAREN) 1 % gel 2 g: 2 g | TOPICAL | @ 01:00:00

## 2022-12-25 MED ADMIN — iloprost (VENTAVIS) nebulizer solution: 5 ug | RESPIRATORY_TRACT | @ 19:00:00

## 2022-12-25 MED ADMIN — azithromycin (ZITHROMAX) tablet 250 mg: 250 mg | ORAL | @ 12:00:00 | Stop: 2025-08-25

## 2022-12-25 MED ADMIN — ondansetron (ZOFRAN) injection 4 mg: 4 mg | INTRAVENOUS | @ 12:00:00

## 2022-12-25 MED ADMIN — iloprost (VENTAVIS) nebulizer solution: 5 ug | RESPIRATORY_TRACT | @ 16:00:00

## 2022-12-25 MED ADMIN — posaconazole (NOXAFIL) delayed released tablet 200 mg: 200 mg | ORAL | @ 12:00:00

## 2022-12-25 MED ADMIN — insulin glargine (LANTUS) injection 12 Units: 12 [IU] | SUBCUTANEOUS | @ 01:00:00

## 2022-12-25 MED ADMIN — sildenafiL (pulm.hypertension) (REVATIO) tablet 40 mg: 40 mg | ORAL | @ 20:00:00

## 2022-12-25 MED ADMIN — ipratropium-albuterol (DUO-NEB) 0.5-2.5 mg/3 mL nebulizer solution 3 mL: 3 mL | RESPIRATORY_TRACT | @ 19:00:00

## 2022-12-25 MED ADMIN — acetaminophen (TYLENOL) tablet 650 mg: 650 mg | ORAL | @ 10:00:00

## 2022-12-25 MED ADMIN — magnesium oxide-Mg AA chelate (Magnesium Plus Protein) 2 tablet: 2 | ORAL | @ 12:00:00

## 2022-12-25 MED ADMIN — sildenafiL (pulm.hypertension) (REVATIO) tablet 40 mg: 40 mg | ORAL | @ 01:00:00

## 2022-12-25 MED ADMIN — iloprost (VENTAVIS) nebulizer solution: 5 ug | RESPIRATORY_TRACT | @ 01:00:00

## 2022-12-25 MED ADMIN — magnesium oxide-Mg AA chelate (Magnesium Plus Protein) 2 tablet: 2 | ORAL | @ 01:00:00

## 2022-12-25 MED ADMIN — calcium carbonate (TUMS) chewable tablet 200 mg elem calcium: 200 mg | ORAL | @ 14:00:00

## 2022-12-25 MED ADMIN — iloprost (VENTAVIS) nebulizer solution: 5 ug | RESPIRATORY_TRACT | @ 04:00:00

## 2022-12-25 MED ADMIN — calcium carbonate (TUMS) chewable tablet 200 mg elem calcium: 200 mg | ORAL | @ 01:00:00

## 2022-12-25 MED ADMIN — lidocaine (ASPERCREME) 4 % 1 patch: 1 | TRANSDERMAL | @ 12:00:00

## 2022-12-25 MED ADMIN — sildenafiL (pulm.hypertension) (REVATIO) tablet 40 mg: 40 mg | ORAL | @ 12:00:00

## 2022-12-25 MED ADMIN — prochlorperazine (COMPAZINE) injection 2.5 mg: 2.5 mg | INTRAVENOUS | @ 17:00:00 | Stop: 2022-12-25

## 2022-12-25 MED ADMIN — diclofenac sodium (VOLTAREN) 1 % gel 2 g: 2 g | TOPICAL | @ 10:00:00

## 2022-12-25 MED ADMIN — enoxaparin (LOVENOX) syringe 40 mg: 40 mg | SUBCUTANEOUS | @ 01:00:00

## 2022-12-25 NOTE — Unmapped (Signed)
Problem: Adult Inpatient Plan of Care  Goal: Plan of Care Review  Outcome: Ongoing - Unchanged  Goal: Patient-Specific Goal (Individualized)  Outcome: Ongoing - Unchanged  Goal: Absence of Hospital-Acquired Illness or Injury  Outcome: Ongoing - Unchanged  Intervention: Identify and Manage Fall Risk  Recent Flowsheet Documentation  Taken 12/24/2022 2000 by Chesley Noon, RN  Safety Interventions:   aspiration precautions   bed alarm   bleeding precautions   environmental modification   fall reduction program maintained   infection management  Intervention: Prevent Skin Injury  Recent Flowsheet Documentation  Taken 12/24/2022 2000 by Chesley Noon, RN  Positioning for Skin: Sitting in Chair  Intervention: Prevent and Manage VTE (Venous Thromboembolism) Risk  Recent Flowsheet Documentation  Taken 12/24/2022 2000 by Chesley Noon, RN  VTE Prevention/Management:   anticoagulant therapy   ambulation promoted  Intervention: Prevent Infection  Recent Flowsheet Documentation  Taken 12/24/2022 2000 by Chesley Noon, RN  Infection Prevention:   environmental surveillance performed   equipment surfaces disinfected   hand hygiene promoted   personal protective equipment utilized   rest/sleep promoted   single patient room provided  Goal: Optimal Comfort and Wellbeing  Outcome: Ongoing - Unchanged  Goal: Readiness for Transition of Care  Outcome: Ongoing - Unchanged  Goal: Rounds/Family Conference  Outcome: Ongoing - Unchanged     Problem: Fall Injury Risk  Goal: Absence of Fall and Fall-Related Injury  Outcome: Ongoing - Unchanged  Intervention: Promote Injury-Free Environment  Recent Flowsheet Documentation  Taken 12/24/2022 2000 by Chesley Noon, RN  Safety Interventions:   aspiration precautions   bed alarm   bleeding precautions   environmental modification   fall reduction program maintained   infection management     Problem: Gas Exchange Impaired  Goal: Optimal Gas Exchange  Outcome: Ongoing - Unchanged  Intervention: Optimize Oxygenation and Ventilation  Recent Flowsheet Documentation  Taken 12/24/2022 2000 by Chesley Noon, RN  Head of Bed [facility]) Positioning: HOB at 30-45 degrees  Taken 12/24/2022 1930 by Chesley Noon, RN  Head of Bed [facility]) Positioning: HOB at 30-45 degrees     Problem: Comorbidity Management  Goal: Blood Glucose Levels Within Targeted Range  Outcome: Ongoing - Unchanged  Goal: Blood Pressure in Desired Range  Outcome: Ongoing - Unchanged

## 2022-12-25 NOTE — Unmapped (Signed)
Critical Response Nurse Consult Note    Critical Response Nurse consult was ordered for Secondary Assessment and pt with generalized concern/ feels plan of care is not adequately addressing concerns. In particular, the order of 250 ml bolus of IV lactated ringers in sitting of her increased work of breathing and decreased urine output.      On Unit  [facility]    The patient???s primary language is [place].    Interpreter services were N/A    The consult was Not triggered by DI      The patient???s DI score at time of consult was 41-50    Interventions included:   Dr. Vernell Barrier went to bedside to address pt concerns. LR bolus discontinued.     02:00 vital signs    HR 79  RR 18  BP 95/70 (76)  O2 sat 94% on 6 liters nasal canula (with pt holding non rebreather to face at times to talk)    01:44 Vital signs     HR 84  RR 19  BP 84/53 (60)  O2 96% on 6 liters nasal canula    I/O net at 00:00 per Epic charting is negative 2375 ml    Per Primary RN,  pt resting in bed at this time. Pt with order for oral Lasix 80 mg scheduled for 06:00.       The time frame for follow-up reassessment 2 hour    Primary nurse was educated to submit page at the above time frame established, but if patient exhibits any acute deviations from baseline and or have signs and symptoms of patient deterioration, the recommendation is to activate the rapid response team.    Thank you for your consult,    Tilda Burrow, RN

## 2022-12-25 NOTE — Unmapped (Signed)
Pulmonary Consult Service  Consult Follow Up Note      Primary Service:   Medicine  Primary Service Attending:  Ebony Hail, MD  Reason for Consult:   Assistance with diuresis    ASSESSMENT and PLAN     Karen Barber is a 57 y.o. 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 Pt was found to have acute on chronic respiratory failure (sat in 70s on home 6L) in the setting of not taking her home medications (sildenafil, lasix, nebs, inhalers) in preparation for PET scan, admitted 9/11. She was admitted to the ICU initially, has since been weaned back to 6-8L [place] with airway clearance and diuresis. Her acute on chronic hypoxemia and increase O2 requirement was likely multifactorial -- combination of volume overload as well as lack of airway clearance at home, however I think chronically her O2 requirement (related to her PH) is higher than the amount she has been on at home and she continues to have severe exertional hypoxemia.     Working on weaning her back to her home oxygen requirement and initiating inhaled Tyvaso at discharge.  Continuing to have challenges managing volume status, now complicated by general malaise/nausea vomiting -     RV Failure  PAH (Group 3, 5)  RHC 9/20 demonstrated severe pulmonary hypertension, markedly elevated PVR (9.3-10.5) and low CO/CI  HRCT completed for PH-ILD diagnosis and Tyvaso enrollment sent to [facility], pending teaching.   Her O2 requirements seem to be stable at 8-10 L [facility] however since 10/5 has been satting appropriately at 6L.     Plan:  - Diuresis per primary team. Being held today for increasing Cr and concern of hypovolemia contributing to hypotension and rapid response - this is reasonable, would anticipate resuming tomorrow 10/8 at a perhaps a lower dose (60mg  BID to aim for more net even) as long as renal function is not worsening  - Patient has been approved for PAP assistance (free drug for Tyvaso for one calendar year. [facility] has been notified and plan is to have meds shipped to [facility] or pt home. Medication teaching should occur on the day of discharge -previously planned for 10/8, on hold in the setting of patient feeling unwell  - Continue sildenafil 40 mg TID  - Continue inhaled iloprost 5 mcg q4h. Please do not mix with other nebulizer solutions  - Continue daily weight with strict I/O  - cont daily pro-BNP    Acute on Chronic Hypoxemic Respiratory Failure  COPD/Asthma  Pulmonary Sarcoidosis  Bronchiectasis s/s sarcoid  Soluble IL-2 receptor, Aspergillus Galactomannan Antigen, IgE checked 10/2022 all wnl. PET scan 9/17 does not appear to be active pulmonary sarcoidosis and again it is overall felt to be burned out from sarcoidosis standpoint. She is now stable satting at >95% on 8-10L via [facility]. It is likely she has higher O2 requirement chronically than the amount she has been using at home. This may reflect her new baseline and this was discussed with the patient. Discussion had with the patient about more realistic oxygen sats for her and limits on activity at home to prevent desats.     Plan:  - Set O2 sat goal at 90-94%, wean oxygen as tolerated  - continue home pulmicort, brovana, PRN albuterol, azithromycin  -  please cont airway clearance:   - duonebs TID and HTS (3%) BID with aerobika    Chronic Pulmonary Aspergillosis   Aspergillus Galactomannan Antigen, IgE checked 10/2022 all wnl.   - continue home posaconazole   - pt has outpt evaluation for [facility]AE scheduled for 10/25 for history of recurrent hemoptysis    Karen Barber was seen, examined and discussed with  Dr. Lurena Nida  Thank you for involving [place] in her care. We look forward to following with you.  Recommendations were communicated to the primary team at 10:14 AM  Please page [place] with any questions or concerns at [phone] (pulmonary consult fellow).    Audery Amel  Medical Student    Kandis Fantasia, MD    Attestation     I saw and evaluated the patient, participating in key portions of the E/M service. I personally reviewed and agree with the plan as documented in the resident's note. I personally spent 50 minutes face-to-face and non face-to-face in the care of this patient, which includes all pre, intra and post visit time on the date of service including examining the patient, evaluating/interpreting objective clinical data, coordinating care with the entire multidisciplinary care team and developing a comprehensive management plan as outlined above.  All documented time was specific to the E/M visit and does not include any procedures that may have been performed.     Dellis Filbert, MD        Interval/HPI:     Interval history: Overnight, had a rapid response called for hypotension, at which time she felt relatively asymptomatic.  Today, she complains of nausea with reflux sensation improved with Tums.    History of Present Illness:  Karen Barber is a 57 y.o. female with hx pf pulmonary sarcoidosis, COPD, PAH (baseline home O2 6L), chronic pulmonary aspergillosis, s/p LUL lobectomy 2016 who initially presented to the hospital for a PET scan to evaluate her sarcoidosis.     Pt was found to have acute on chronic respiratory failure (sat in 70s on home 6L) in the setting of not taking her home medications (sildenafil, lasix, nebs, inhalers) in preparation for PET scan. As her respiratory status did not improve with NRB, placed on HFNC. 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 She received one dose of 125mg  of IV methylprednisolone and then was transferred to the MICU for further management of her hypoxia. Pt had a week long stay in the MICU and was weaned from HFNC to [facility] after IV diuresis and course of antibiotics (completed course of levaquin) for possible pneumonia. Pt has been continuing home airway clearance (Aerobika and HTS neb), and home pulmicort, brovana, atrovent, and PRN albuterol. Showing significant signs of improvement but still having desaturations with ambulation.     Pt started iloprost 9/19 and has tolerated it well so far. RHC 9/20 demonstrated severe pulmonary hypertension, markedly elevated PVR (9.3-10.5) and low cardiac output/index. HRCT chest showed unchanged fibrotic pulmonary sarcoidosis with bilateral segmental and subsegmental fibrosis post left upper lobectomy with unchanged apical right upper lobe and superior segment left lower lobe bullae.     Of note, has been hospitalized twice prior to this in the past 6 weeks.  8/18-8/25: presented with increased O2 requirement, increased swelling, brown sputum production.   - treated for serratia pneumonia (completed levaquin)  - Was discharged on AVAPS for home, as well as with HTS/albuterol and [place].  10/21/22-10/25/22: presented with DOE, up to 8-10L with exertion and desats to 70%.   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 A.niger from sputum 08/2016-09/2016.    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    Sarcoidosis 1995    Type 2 diabetes mellitus with hyperglycemia (CMS-HCC) 07/27/2014    Visual impairment     glasses     Past Surgical History:   Procedure Laterality Date    LUNG LOBECTOMY      PR AMPUTATION TOE,MT-P JT Right 12/12/2019    Procedure: Right foot fifth toe amputation at the metatarsophalangeal joint level;  Surgeon: Britt Bottom, DPM;  Location: MAIN OR [facility];  Service: Vascular    PR AMPUTATION TOE,MT-P JT Right 06/16/2020    Procedure: Right foot amputation of 3rd toe at mpj level;  Surgeon: Britt Bottom, DPM;  Location: MAIN OR [facility];  Service: Vascular    PR RIGHT HEART CATH O2 SATURATION & CARDIAC OUTPUT N/A 04/29/2019    Procedure: Right Heart Catheterization;  Surgeon: Rosana Hoes, MD;  Location: [facility] CATH;  Service: Cardiology    PR RIGHT HEART CATH O2 SATURATION & CARDIAC OUTPUT N/A 12/08/2022    Procedure: Right Heart Catheterization;  Surgeon: Neal Dy, MD;  Location: [facility] CATH;  Service: Cardiology    PR THORACOSCOPY SURG TOT PULM DECORT Left 12/14/2014    Procedure: THORACOSCOPY SURG; W/TOT PULM DECORTIC/PNEUMOLYS;  Surgeon: Evert Kohl, MD;  Location: MAIN OR [facility];  Service: Cardiothoracic    PR THORACOSCOPY W/THERA WEDGE RESEXN INITIAL UNILAT Left 12/03/2014    Procedure: THORACOSCOPY, SURGICAL; WITH THERAPEUTIC WEDGE RESECTION (EG, MASS, NODULE) INITIAL UNILATERAL;  Surgeon: Evert Kohl, MD;  Location: MAIN OR [facility];  Service: Cardiothoracic       Other History:  Family History   Problem Relation Age of Onset    Dementia Mother     Diabetes Father     Diabetes Brother     Diabetes Maternal Aunt     Sarcoidosis Maternal Aunt     Diabetes Maternal Uncle     Diabetes Paternal Aunt     Diabetes Paternal Uncle      Social History     Socioeconomic History    Marital status: Single   Tobacco Use    Smoking status: Former     Current packs/day: 0.00     Average packs/day: 0.5 packs/day for 9.0 years (4.5 ttl pk-yrs)     Types: Cigarettes     Start date: 05/02/1978     Quit date: 05/03/1987     Years since quitting: 35.6 Smokeless tobacco: Never   Vaping Use    Vaping status: Never Used   Substance and Sexual Activity  Alcohol use: No     Alcohol/week: 0.0 standard drinks of alcohol    Drug use: No    Sexual activity: Yes   Other Topics Concern    Do you use sunscreen? No    Tanning bed use? No    Are you easily burned? No    Excessive sun exposure? No    Blistering sunburns? No     Social Determinants of Health     Financial Resource Strain: Low Risk  (11/06/2022)    Overall Financial Resource Strain (CARDIA)     Difficulty of Paying Living Expenses: Not hard at all   Food Insecurity: No Food Insecurity (11/06/2022)    Hunger Vital Sign     Worried About Running Out of Food in the Last Year: Never true     Ran Out of Food in the Last Year: Never true   Transportation Needs: No Transportation Needs (11/06/2022)    PRAPARE - Therapist, art (Medical): No     Lack of Transportation (Non-Medical): No       Home Medications:  No current facility-administered medications on file prior to encounter.     Current Outpatient Medications on File Prior to Encounter   Medication Sig Dispense Refill    ACCU-CHEK GUIDE GLUCOSE METER Misc 1 Device by Other route Four (4) times a day (before meals and nightly). AS DIRECTED 1 kit 0    albuterol 2.5 mg /3 mL (0.083 %) nebulizer solution Inhale 3 mL (2.5 mg total) by nebulization every six (6) hours as needed for wheezing or shortness of breath (airway clearance/cough). 360 mL 3    albuterol HFA 90 mcg/actuation inhaler Inhale 2 puffs every four (4) hours as needed for wheezing or shortness of breath. 54 g 0    arformoterol (BROVANA) 15 mcg/2 mL Inhale 2 mL (15 mcg total) by nebulization in the morning and 2 mL (15 mcg total) in the evening. 120 mL 2    aspirin-caffeine (BAYER BACK AND BODY) 500-32.5 mg Tab Take by mouth. Takes 2 1/2 pills 3-4 times a day as needed.      azithromycin (ZITHROMAX) 250 MG tablet Take 1 tablet (250 mg total) by mouth daily. Start on 9/1 after completing the levofloxacin 90 tablet 0    blood sugar diagnostic Strp by Other route Three (3) times a day. ACCU-CHEK Guide meter 300 strip 1    budesonide (PULMICORT) 0.5 mg/2 mL nebulizer solution Inhale 2 mL (0.5 mg total) by nebulization in the morning and 2 mL (0.5 mg total) in the evening. 120 mL 2    furosemide (LASIX) 80 MG tablet Take 1 tablet (80 mg total) by mouth two (2) times a day. Take a third dose as needed for increasing weight or increasing swelling. 60 tablet 0    gabapentin (NEURONTIN) 300 MG capsule Take 1 capsule (300 mg total) by mouth Three (3) times a day. 270 capsule 3    insulin aspart (NOVOLOG U-100 INSULIN ASPART) 100 unit/mL vial Inject 0.01-0.2 mL (1-20 Units total) under the skin Four (4) times a day (before meals and nightly). SLIDING SCALE 20 mL 1    insulin glargine (BASAGLAR KWIKPEN U-100 INSULIN) 100 unit/mL (3 mL) injection pen Inject 0.18 mL (18 Units total) under the skin nightly. 15 mL 0    ipratropium (ATROVENT) 0.02 % nebulizer solution Inhale 2.5 mL (500 mcg total) by nebulization four (4) times a day. 300 mL 0    posaconazole (NOXAFIL) 100  mg delayed released tablet Take 2 tablets by mouth once daily 60 tablet 0    sildenafiL, pulm.hypertension, (REVATIO) 20 mg tablet Take 1 tablet (20 mg total) by mouth Three (3) times a day. 90 tablet 0    OXYGEN-AIR DELIVERY SYSTEMS MISC Dose: 2-3 LPM         In-Hospital Medications:  Scheduled:   arformoterol  15 mcg Nebulization BID (RT)    azithromycin  250 mg Oral Daily    budesonide  0.5 mg Nebulization BID (RT)    diclofenac sodium  2 g Topical QID    enoxaparin (LOVENOX) injection  40 mg Subcutaneous Nightly    flu vacc [id number] 6mos up(PF)  0.5 mL Intramuscular During hospitalization    [Provider Hold] furosemide  80 mg Oral BID    gabapentin  100 mg Oral TID    iloprost  5 mcg Nebulization 6XD    insulin glargine  12 Units Subcutaneous Nightly    insulin lispro  0-20 Units Subcutaneous ACHS    insulin lispro  3 Units Subcutaneous 3xd Meals    ipratropium-albuterol  3 mL Nebulization TID (RT)    lidocaine  1 patch Transdermal Daily    magnesium oxide-Mg AA chelate  2 tablet Oral BID    polyethylene glycol  17 g Oral Daily    posaconazole  200 mg Oral Daily    sildenafiL (pulm.hypertension)  40 mg Oral TID    sodium chloride  4 mL Nebulization BID (RT)         PRN Medications:  acetaminophen, albuterol, calcium carbonate, dextrose in water, glucagon, melatonin, [EXPIRED] Implement **AND** [EXPIRED] Care order/instruction **AND** [EXPIRED] Vital signs **AND** Adult Oxygen therapy **AND** [EXPIRED] sodium chloride **AND** [EXPIRED] sodium chloride 0.9% **AND** [EXPIRED] diphenhydrAMINE **AND** [EXPIRED] famotidine **AND** methylPREDNISolone sodium succinate **AND** [EXPIRED] EPINEPHrine, ondansetron **OR** ondansetron, senna, sodium chloride    Allergies:  Allergies as of 11/29/2022    (No Known Allergies)         PHYSICAL EXAM:   BP 116/82  - Pulse 86  - Temp 36.8 ??C (98.2 ??F) (Oral)  - Resp 25  - Ht 180.3 cm (5' 11)  - Wt 87.7 kg (193 lb 5.5 oz)  - LMP  (LMP Unknown)  - SpO2 100%  - BMI 26.97 kg/m??   General: Alert, well-appearing, and in no distress.  Eyes: Anicteric sclera, conjunctiva clear.  ENT:   no drainage.  Cardiovascular: Regular rate and rhythm.  Pulmonary: Diminished lung sounds in bilateral upper lobes, crackles bilateral bases  Musculoskeletal: No clubbing. trace LE edema  Skin: No rashes or lesions.,    Neuro: No focal neurological deficits.    LABORATORY and RADIOLOGY DATA:     Pertinent Laboratory Data:  Lab Results   Component Value Date    WBC 3.4 (L) 12/25/2022    HGB 10.4 (L) 12/25/2022    HCT 31.5 (L) 12/25/2022    PLT 140 (L) 12/25/2022       Lab Results   Component Value Date    NA 136 12/25/2022    K 5.2 (H) 12/25/2022    CL 98 12/25/2022    CO2 32.0 (H) 12/25/2022    BUN 41 (H) 12/25/2022    CREATININE 2.06 (H) 12/25/2022    GLU 187 (H) 12/25/2022    CALCIUM 9.7 12/25/2022    MG 2.2 12/25/2022    PHOS 5.3 (H) 12/25/2022       Lab Results   Component Value Date    BILITOT 0.7  12/25/2022    BILIDIR <0.10 05/30/2019    PROT 7.3 12/25/2022    ALBUMIN 3.6 12/25/2022    ALT 20 12/25/2022    AST 27 12/25/2022    ALKPHOS 91 12/25/2022    GGT 60 (H) 05/31/2011       Lab Results   Component Value Date    INR 1.02 12/11/2019    APTT 30.9 12/11/2019       Pertinent Micro Data:  Microbiology Results (last day)       ** No results found for the last 24 hours. **             Pertinent Imaging Data:  9/11 CXR   - Extensive fibrotic changes and biapical bulla appear grossly unchanged. Extensive pulmonary fibrosis limits evaluation for superimposed infection.     9/17 Echo    Summary    1. Limited study.    2. The left ventricle is normal in size with normal wall thickness.    3. The left ventricular systolic function is normal, LVEF is visually  estimated at > 55%.    4. The right ventricle is severely dilated in size, with mildly reduced  systolic function.    5. There is severe pulmonary hypertension.    6. TR maximum velocity: 4.4 m/s  Estimated PASP: 86 mmHg.    7. IVC size and inspiratory change suggest mildly elevated right atrial  pressure. (5-10 mmHg).    8. No significant change since prior study from 10/23/2022    9/18 PET CT  Impression   Bulky, partially calcified lymph nodes in the mediastinum and bilateral hilar with no significant to mild uptake, decreased in intensity since prior.   Grossly unchanged metabolically active right supraclavicular lymph nodes, accounting for differences in positioning of the arms and lack of IV contrast.   Bronchiectasis and partially metabolically active reticular opacities are seen bilaterally, most prominent in the lower lobes, overall similar to prior.   Dilated main pulmonary artery measuring up to 4.0 cm, suggestive of pulmonary arterial hypertension.     9/20 RHC  FINAL CARDIAC CATHETERIZATION REPORT    CONCLUSIONS:  - Severe pulmonary Hypertension  - Markedly elevated PVR (9.3 - 10.5)  - Low cardiac output / index     9/25 CXR  No significant interval change since the prior chest radiograph.     9/25 CT Chest High Resolution w/o contrast  Unchanged fibrotic pulmonary sarcoidosis with bilateral segmental and subsegmental fibrosis post left upper lobectomy with unchanged apical right upper lobe and superior segment left lower lobe bullae.     9/27 CXR  Fibrotic interstitial lung disease with biapical large bullae which are better characterized on the prior CT chest. No significant interval changes.     9/28 CXR  Fibrotic interstitial lung disease with biapical large bullae which are better characterized on the prior CT chest. No significant interval changes.    10/4 CXR  Unchanged combined pulmonary fibrosis and emphysema with biapical bullae.     10/7 CXR  Little interval change from prior.

## 2022-12-25 NOTE — Unmapped (Signed)
Infectious Disease ([facility]) Progress Note    Assessment & Plan:   Karen Barber is a 57 y.o. female whose presentation is complicated by bronchiectasis type 1 in the setting of pulmonary sarcoidosis (6L [place] home O2), HTN, T2DM, COPD, s/p LUL lobectomy, and asthma that presented to [facility] admitted to MICU after becoming hypoxic after holding home meds prior to PET scan. After 1 week diuresing ICU stabilized patient she was transferred to the MPCU.    Principal Problem:    Acute on chronic hypoxic respiratory failure (CMS-HCC)  Active Problems:    Pulmonary sarcoidosis (CMS-HCC)    Type 2 diabetes mellitus with hyperglycemia (CMS-HCC)    COPD (chronic obstructive pulmonary disease) (CMS-HCC)    Chronic pulmonary aspergillosis (CMS-HCC)    Pulmonary hypertension (CMS-HCC)    AKI (acute kidney injury) (CMS-HCC)    Active Problems  #Acute on Chronic Hypoxemic Respiratory Failure -   #Pulmonary Sarcoidosis -   #COPD/asthma -   #Bronchiectasis  Patient has notable history of pulmonary sarcoidosis, COPD, pulmonary hypertension and is on baseline oxygen 6 L.  Recent admission with pneumonia growing Serratia marcescens  s/p 1x Rocephin (8/18), Vancomycin (8/18-8/19), Zosyn (8/18-8/23) discharged on levofloxacin 500 mg ending on 8/31.  Patient held her medications only for the morning prior to an outpatient PET CT scan during which she became hypoxic to the 70s and was admitted to the MICU.  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 She received one dose of 125mg  of IV methylprednisolone. She was weaned from high flow nasal cannula to large bore nasal cannula after IV diuresis and a course of antibiotics for possible pneumonia with Vanco/cefepime later transition to levofloxacin with a total 1 week treatment with antibiotics. Started on vanc/cefepime for broad coverage --> stopped vanc and cefepime 9/12- then levaquin (9/12---9/16).  Patient was transferred to the floor on 9/18 after showing significant signs of improvement but still having occasional desaturations with ambulation. PET scan 9/18 is not indicative of active pulmonary sarcoidosis.  Acute exacerbation likely multifactorial including in not taking her home medications as per directed for airway clearance, volume overload, and higher O2 requirement chronically than the amount she has been using. RHC 9/20 showing significant pulm HTN disease with mean PA pressure of 47, for which pulmonology team started Iloprost. Notably, wedge pressure on that study was 5, which is reassuring against volume as driver of her respiratory status at that time. Patient has been improving in her O2 requirement, now at her home oxygen rate. Will transition back to home PO lasix dose to assess management with oral regimen.  -- Continue to wean O2 with goal saturations of 90-94% per pulmonology  -- Holding PO Lasix BID in setting of increased creatinine and significant hypotension  -- Consult palliative for goals of care conversation as well as symptom management.  -- Plan to get oxymizer for her while admitted. Will need oxymizer at home  -- Continue home airway clearance (DuoNebs and HTS 3 times daily with [place])  -- Continue home Pulmicort, Brovana, Atrovent, as needed albuterol, and azithromycin  -- PT and OT consult  -- Standing weights daily     #Pulmonary hypertension - RV failure - orthostatic hypotension  Patient has history of pulmonary hypertension due to pulmonary sarcoidosis/COPD.  TTE this admission 9/17 with severely dilated right atrium with severe pulmonary hypertension PASP 86 mmHg.  Estimated right atrial pressure 5 to 10 mmHg.  On Lasix 80 mg twice daily at home.  Echo showed LVEF greater than  55% and severe pulmonary hypertension no significant change since prior study.  Right heart cath showed severe pulmonary Hypertension, markedly elevated PVR (9.3 - 10.5) and low cardiac output / index. HRCT scan shows unchanged fibrotic pulmonary sarcoidosis with bilateral segmental and subsegmental fibrosis post left upper lobectomy with unchanged apical right upper lobe and superior segment left lower lobe bullae.   -- Continue inhaled iloprost 5 mcg 6 times a day q4h; please do not mix with other nebulizer solutions   -- Plan to transition to Tyvaso nebs on discharge.  Scheduled to arrive on 10/8, will trial inpatient  -- [facility] rep to do teaching prior to DC    #Acute kidney injury on CKD3 (downtrending)  On admission patient's creatinine 1.3 which appears to be baseline. Cr fluctuating in 1.5-1.8 range.  -- Renally dose medications  -- Daily BMP    #Secondary Hyperparathyroidism, Hypocalcemia, and Hyperphosphatemia  Findings are consistent with secondary hyperparathyroidism due to CKD.  Ionized calcium: 4.37 mg/dL (low)  Phosphate: 5.6 mg/dL (elevated)  PTH: 500.9 pg/mL (elevated)  Vitamin D Total (25OH) 35  -- Reconsider need for future oral calcium carbonate 1600 mg TID  -- Reconsider need for future phoslo 667 mg PO TID   -- Reassess PTH levels in 4-6 weeks.    #Type 2 diabetes mellitus with hyperglycemia and hypoglycemia  Hemoglobin A1c 8.3%.  Home regimen includes glargine 18 units nightly and sliding scale insulin.  Large fluctuations in glucose greater than 300 and occasional values ~50.  -- Sliding scale insulin insulin sensitivity factor 40  --12 glargine nightly  -- 3 units lispro tid     #Left leg swelling (resolved )  Patient endorses intermittent left > right leg swelling. Venous doppler unremarkable.      Chronic Problems     #Chronic Pain   - Resuming gabapentin, though at reduced dose  - PT/OT consulted, appreciate recs     #Chronic pulmonary aspergillosis  Patient has history of aspergilloma and is on chronic posaconazole. Soluble IL-2 receptor, Aspergillus Galactomannan Antigen, IgE checked 10/2022 all wnl  -Continue home posaconazole Ppx  -Outpatient evaluation for [facility] scheduled for 10/25 to evaluate recurrent hemoptysis     #Iron deficiency anemia  #Chronic normocytic anemia (baseline hemoglobin 11)  Patient has some mild normocytic anemia with hemoglobin of 11. Iron panel shows iron of 37 with iron saturation of 15.   --Iron supplementation with iron dextran 1,000mg  in 250cc    #Hyperlipidemia  Last lipid profile showed an LDL of 146  -- Follow-up outpatient    Issues Impacting Complexity of Management:  -The patient is at high risk for the development of complications of volume overload due to the need to provide IV hydration for suspected hypovolemia in the setting of: Pulmonary hypertension  -Need for the following intensive monitoring parameter(s) due to high risk of clinical decline: continuous oxygen monitoring, telemetry, and frequent monitoring of urine output to assess for the efficacy of diuretic regimen  -Need for intensive oxygen therapy of HFNC, which places the patient at high risk for oxygen toxicity  -The patient is at high risk of complications from pulmonary hypertension    Daily Checklist:  Diet: Regular Diet   DVT PPx: Lovenox 40mg  q24h  Electrolytes: Replete Potassium to >/= 3.6 and Magnesium to >/= 1.8  Code Status: DNR and DNI  Dispo:  Continue MPCU    Team Contact Information:   Primary Team: Infectious Disease ([facility])  Primary Resident: Zella Richer, MD  Resident's Pager: [phone]  Infect Disease Intern - [facility])    Interval History:     Overnight, a rapid response was called for concerns of hypotension.  See significant event note.    Patient reports concerns for nausea this morning as well as paresthesias in her feet bilaterally that she experiences when she feels that her feet are swollen.  The nausea is partially responsive to Compazine.  Denies any increased shortness of breath with exertion, continues to use nonrebreather as needed.  Denies any fevers, chills, chest pain, or worsening cough.    ROS negative except for where otherwise noted above.    Objective:   Temp:  [36.3 ??C (97.4 ??F)-36.8 ??C (98.2 ??F)] 36.8 ??C (98.2 ??F)  Heart Rate: [75-86] 86  SpO2 Pulse:  [77-86] 86  Resp:  [14-37] 25  BP: (55-116)/(12-82) 116/82  FiO2 (%):  [45 %] 45 %  SpO2:  [75 %-100 %] 100 %,   Intake/Output Summary (Last 24 hours) at 12/25/2022 1240  Last data filed at 12/25/2022 0819  Gross per 24 hour   Intake 400 ml   Output 1275 ml   Net -875 ml   , FiO2 (%):  [45 %] 45 %  O2 Device: Large bore nasal cannula (cool high flow)  O2 Flow Rate (L/min):  [6 L/min-8 L/min] 6 L/min    Gen: NAD, converses well  HENT: Atraumatic, normocephalic  Heart: RRR  Lungs: Decreased breath sounds bilaterally in the lower lung fields  Abdomen: Soft, nontender nondistended  Extremities: Trace pedal edema, improved lower leg exam compared to yesterday

## 2022-12-26 LAB — BASIC METABOLIC PANEL
ANION GAP: 6 mmol/L (ref 5–14)
ANION GAP: 6 mmol/L (ref 5–14)
BLOOD UREA NITROGEN: 43 mg/dL — ABNORMAL HIGH (ref 9–23)
BLOOD UREA NITROGEN: 43 mg/dL — ABNORMAL HIGH (ref 9–23)
BUN / CREAT RATIO: 18
BUN / CREAT RATIO: 17
CALCIUM: 10.1 mg/dL (ref 8.7–10.4)
CALCIUM: 10.1 mg/dL (ref 8.7–10.4)
CHLORIDE: 97 mmol/L — ABNORMAL LOW (ref 98–107)
CHLORIDE: 99 mmol/L (ref 98–107)
CO2: 36 mmol/L — ABNORMAL HIGH (ref 20.0–31.0)
CO2: 34 mmol/L — ABNORMAL HIGH (ref 20.0–31.0)
CREATININE: 2.41 mg/dL — ABNORMAL HIGH
CREATININE: 2.46 mg/dL — ABNORMAL HIGH
EGFR CKD-EPI (2021) FEMALE: 23 mL/min/{1.73_m2} — ABNORMAL LOW (ref >=60)
EGFR CKD-EPI (2021) FEMALE: 22 mL/min/{1.73_m2} — ABNORMAL LOW (ref >=60)
GLUCOSE RANDOM: 87 mg/dL (ref 70–179)
GLUCOSE RANDOM: 125 mg/dL (ref 70–179)
POTASSIUM: 4.8 mmol/L (ref 3.4–4.8)
POTASSIUM: 5.2 mmol/L — ABNORMAL HIGH (ref 3.4–4.8)
SODIUM: 139 mmol/L (ref 135–145)
SODIUM: 139 mmol/L (ref 135–145)

## 2022-12-26 LAB — CBC
HEMATOCRIT: 34.6 % (ref 34.0–44.0)
HEMOGLOBIN: 11.1 g/dL — ABNORMAL LOW (ref 11.3–14.9)
MEAN CORPUSCULAR HEMOGLOBIN CONC: 32.2 g/dL (ref 32.0–36.0)
MEAN CORPUSCULAR HEMOGLOBIN: 27.2 pg (ref 25.9–32.4)
MEAN CORPUSCULAR VOLUME: 84.5 fL (ref 77.6–95.7)
MEAN PLATELET VOLUME: 8.8 fL (ref 6.8–10.7)
PLATELET COUNT: 160 10*9/L (ref 150–450)
RED BLOOD CELL COUNT: 4.09 10*12/L (ref 3.95–5.13)
RED CELL DISTRIBUTION WIDTH: 17.6 % — ABNORMAL HIGH (ref 12.2–15.2)
WBC ADJUSTED: 3.4 10*9/L — ABNORMAL LOW (ref 3.6–11.2)

## 2022-12-26 LAB — MAGNESIUM
MAGNESIUM: 2.4 mg/dL (ref 1.6–2.6)
MAGNESIUM: 2.4 mg/dL (ref 1.6–2.6)

## 2022-12-26 LAB — PRO-BNP: PRO-BNP: 7263 pg/mL — ABNORMAL HIGH (ref <=300.0)

## 2022-12-26 LAB — LACTATE, VENOUS, WHOLE BLOOD: LACTATE BLOOD VENOUS: 1.4 mmol/L (ref 0.5–1.8)

## 2022-12-26 MED ADMIN — arformoterol (BROVANA) nebulizer solution 15 mcg/2 mL: 15 ug | RESPIRATORY_TRACT | @ 12:00:00

## 2022-12-26 MED ADMIN — insulin lispro (HumaLOG) injection 3 Units: 3 [IU] | SUBCUTANEOUS | @ 21:00:00

## 2022-12-26 MED ADMIN — acetaminophen (TYLENOL) tablet 650 mg: 650 mg | ORAL | @ 21:00:00

## 2022-12-26 MED ADMIN — iloprost (VENTAVIS) nebulizer solution: 5 ug | RESPIRATORY_TRACT | @ 05:00:00

## 2022-12-26 MED ADMIN — magnesium oxide-Mg AA chelate (Magnesium Plus Protein) 2 tablet: 2 | ORAL | @ 13:00:00

## 2022-12-26 MED ADMIN — arformoterol (BROVANA) nebulizer solution 15 mcg/2 mL: 15 ug | RESPIRATORY_TRACT

## 2022-12-26 MED ADMIN — furosemide (LASIX) tablet 60 mg: 60 mg | ORAL | @ 13:00:00 | Stop: 2022-12-26

## 2022-12-26 MED ADMIN — magnesium oxide-Mg AA chelate (Magnesium Plus Protein) 2 tablet: 2 | ORAL | @ 01:00:00

## 2022-12-26 MED ADMIN — insulin glargine (LANTUS) injection 12 Units: 12 [IU] | SUBCUTANEOUS | @ 01:00:00

## 2022-12-26 MED ADMIN — ipratropium-albuterol (DUO-NEB) 0.5-2.5 mg/3 mL nebulizer solution 3 mL: 3 mL | RESPIRATORY_TRACT | @ 12:00:00

## 2022-12-26 MED ADMIN — gabapentin (NEURONTIN) capsule 100 mg: 100 mg | ORAL | @ 19:00:00

## 2022-12-26 MED ADMIN — calcium carbonate (TUMS) chewable tablet 200 mg elem calcium: 200 mg | ORAL

## 2022-12-26 MED ADMIN — iloprost (VENTAVIS) nebulizer solution: 5 ug | RESPIRATORY_TRACT | @ 19:00:00

## 2022-12-26 MED ADMIN — azithromycin (ZITHROMAX) tablet 250 mg: 250 mg | ORAL | @ 13:00:00 | Stop: 2025-08-25

## 2022-12-26 MED ADMIN — iloprost (VENTAVIS) nebulizer solution: 5 ug | RESPIRATORY_TRACT | @ 13:00:00

## 2022-12-26 MED ADMIN — posaconazole (NOXAFIL) delayed released tablet 200 mg: 200 mg | ORAL | @ 13:00:00

## 2022-12-26 MED ADMIN — ondansetron (ZOFRAN-ODT) disintegrating tablet 4 mg: 4 mg | ORAL

## 2022-12-26 MED ADMIN — enoxaparin (LOVENOX) syringe 40 mg: 40 mg | SUBCUTANEOUS | @ 01:00:00

## 2022-12-26 MED ADMIN — sildenafiL (pulm.hypertension) (REVATIO) tablet 40 mg: 40 mg | ORAL | @ 01:00:00

## 2022-12-26 MED ADMIN — sildenafiL (pulm.hypertension) (REVATIO) tablet 40 mg: 40 mg | ORAL | @ 19:00:00

## 2022-12-26 MED ADMIN — sodium chloride 3 % NEBULIZER solution 4 mL: 4 mL | RESPIRATORY_TRACT

## 2022-12-26 MED ADMIN — gabapentin (NEURONTIN) capsule 100 mg: 100 mg | ORAL | @ 13:00:00

## 2022-12-26 MED ADMIN — sildenafiL (pulm.hypertension) (REVATIO) tablet 40 mg: 40 mg | ORAL | @ 13:00:00

## 2022-12-26 MED ADMIN — iloprost (VENTAVIS) nebulizer solution: 5 ug | RESPIRATORY_TRACT

## 2022-12-26 MED ADMIN — budesonide (PULMICORT) nebulizer solution 0.5 mg: .5 mg | RESPIRATORY_TRACT | @ 12:00:00

## 2022-12-26 MED ADMIN — sodium chloride 3 % NEBULIZER solution 4 mL: 4 mL | RESPIRATORY_TRACT | @ 12:00:00

## 2022-12-26 MED ADMIN — budesonide (PULMICORT) nebulizer solution 0.5 mg: .5 mg | RESPIRATORY_TRACT

## 2022-12-26 MED ADMIN — lidocaine (ASPERCREME) 4 % 1 patch: 1 | TRANSDERMAL | @ 13:00:00

## 2022-12-26 MED ADMIN — iloprost (VENTAVIS) nebulizer solution: 5 ug | RESPIRATORY_TRACT | @ 16:00:00

## 2022-12-26 MED ADMIN — ipratropium-albuterol (DUO-NEB) 0.5-2.5 mg/3 mL nebulizer solution 3 mL: 3 mL | RESPIRATORY_TRACT | @ 19:00:00

## 2022-12-26 MED ADMIN — gabapentin (NEURONTIN) capsule 100 mg: 100 mg | ORAL | @ 01:00:00

## 2022-12-26 NOTE — Unmapped (Signed)
Patient on LBNC 8LPM.  No distress noted.

## 2022-12-26 NOTE — Unmapped (Signed)
Patient on 8LBNC, had to be titrated up due to SPO2 staying consistently at lower limit of SPO2 goal.    Using non re breather frequently for dyspnea, patien encouraged to utilize pursed lip breathing exercises before using NRB    Ventavis given as ordered with no adverse rxn,   care ongoing.

## 2022-12-26 NOTE — Unmapped (Signed)
Palliative Care Consult Note    Consultation from Requesting Attending Physician:  Karen Hail, MD  Service Requesting Consult:  Infectious Disease ([facility])  Reason for Consult Request from Attending Physician:  Evaluation of Symptoms and Goals of Care / Decision Making  Primary Care Provider:  Loran Senters, FNP        Assessment/Plan:      SUMMARY:  This 57 y.o. patient is seriously and acutely ill due to acute on chronic hypoxemic respiratory failure secondary to volume overload and lack of airway clearance at home, complicated by co-morbid acute and chronic conditions including pulmonary sarcoidosis with PAH (on baseline 6 L supplemental O2 at home), RV failure, COPD, chronic pulmonary aspergillosis s/p LUL lobectomy 2016 for aspergilloma and bronchiectasis. Intially admitted to the MICU for respiratory failure requiring HiFlo nasal canula. Has since been transferred to stepdown given improving respiratory failure although there is concern she likely will have chronically higher O2 requirement related to her PH moving forward. Hospital course also complicated by AKI on CKD intermittently limiting diuresis.     Symptom Assessment and Recommendations:      # Nausea without vomiting: New onset nausea on 10/7 which improved with PRN ODT Zofran and IV Compazine. No present nausea at the time of our evaluation. Thought to be secondary to frequent breathing nebulizers and supplemental O2 vs volume overload.   - CONTINUE Zofran 4 mg q8hr PRN nausea ODT or IV if unable to tolerate po   - START Compazine 5 mg PO q6hr PRN nausea (2nd line if unrelieved by Zofran) -> if nausea is more secondary to volume overload Compazine may be more helpful in symptom management     # Pain in neck and upper back and Paraesthesia in lower extremities: Pain mainly chronic in nature but has been exacerbated since hospitalization. Neck and back pain mainly positional in nature and secondary to posture/hospital bed. Lower extremity paraesthesia and pain previously 2/2 diabetic neuropathy but more recently from volume overload/LE swelling, now improving with diuresis.   - CONTINUE Gabapentin 100 mg TID -> current dose working well for patient (was previously on 300 mg TID at home)  - CONTINUE scheduled Lidocaine patches (can add additional patch if needed)  - Consider scheduling Tylenol 1 g TID   - Continue working with PT/OT     # Dyspnea: 2/2 to underlying pulmonary sarcoidosis and PAH. Dyspnea mainly with any type of exertion. Her goal is to get home with supplemental oxygen and she realizes this likely will be more than she was previously requiring.   - Diuresis and PAH management per primary team   - For now would hold on opiates for air hunger/dyspnea however this is something that can be considered at low dose if exertional dyspnea becomes more of a burden for her/barrier to discharge home     # Insomnia: Minimal sleep since being admitted due to necessary interventions waking her at night.   - Try to minimize night time awakening as much as possible  - Consider scheduling Melatonin (currently PRN)     # Bowel Regimen: Patient endorses regular bowel movements without the scheduled Miralax.   - DISCONTINUE scheduled Miralax and make PRN daily for constipation   - She is certainly at high risk for constipation so may have to reschedule if this becomes an issue     Goals of Care and Decision Making Assessment and Recommendations:     Healthcare Decision Maker if lacks capacity:    HCDM (  patient stated preference) (Active): Karen Barber [phone]    Advance Directive: no    Code status:   Code Status: DNR and DNI         Goals of care as discussed on 12/26/22:  Karen Barber is clear that her goal is to get home to continue to recover. She feels her recovery will be better at home because she will get better sleep and be happier (notes that being in the hospital has made her depressed, often mad, and question her faith. She is clear that she does not want life support including intubation/mechanical ventilations. After discussing more she said that she would not want chest compressions if she had cardiac arrest since this would likely lead to intubation. She also noted that she would not want a feeding tube for life support.     At this point in time she says she is not ready to die and has more life to live. She feels that God has a purpose for her and she is IT sales professional. She would want to be re hospitalized in the future if she were to need it. She did tell us that her Barber ultimately opted to transition to comfort care at home after being diagnosed with COVID so she is familiar with this process. She stated that she is not ready for that option at this time but understands it is available if her wishes were to change.     - No change to code status - DNR/DNI  - Continue full medical support as primary team is doing including going back to the ICU if needed   - Primary goal for patient is to discharge home and she would want re hospitalization if needed       Practical, Emotional, Spiritual Support Assessment and Recommendations:  Prior to the hospitalization Karen Barber lived home alone and was able to support herself. Prior to her getting this sick she enjoyed gardening at home. Her Barber, Karen Barber, lives close by and would be able to help if needed at home. Her friend, Karen Barber, also will be able to move back with her in December which she looks forward to. Her home is currently set up for her to mobilize with how her breathing is.     She relies heavily on her faith and admits this hospitalization has made her question her faith and purpose at times. She has enjoyed speaking to the chaplain during this admission and feels that God has a plan for her.     - Recommend chaplain consult as needed since patient benefited from this   - Appreciate primary team, PT/OT and case management's assistance with helping fulfil patient's wishes of discharging home with oxygen and HH support       Recommendations shared with primary team via Epic chat      Thank you for this consult. Please contact  Cyndie Mull or Liana Crocker  or page Palliative Care if there are any questions.   Palliative Care plans to visit the patient again on 10/9     Subjective:     HPI:  obtained from patient     We talked to Ms. Ackerley at bedside today. She let us know that this admission has been difficult for her given how long she has been in the hospital and how sick she has been. Prior to getting this sick she was living at home alone and enjoyed gardening flowers and playing with her dogs as able. She notes  that prior to this admission she was admitted for pneumonia which is when her breathing really started to get worse. She is saddened and frustrated because her worry is that her pneumonia was initially caused by a change in her inhalers and since then she has not be able to get back to where shew as before from a health standpoint.     Ms. Racy note that at some point during this hospitalization she has  quested her faith and almost lost hope. She notes that being in the hospital has made her depressed and mad sometimes given then amount of times she has to be poked, the amount of inhalers/breathing treatments she has to be doing, and the lack of sleep. She does have hope that the addition of Tyvaso will help with her breathing and she said she was willing to try this if it would make her feel better. She says that she is ready to go home and she feels she will recover better there. She realizes she may need to be on more oxygen when she leaves the hospital than she was before.    She notes that she has good family support with her Barber, Karen Barber, who lives only 2-3 minutes away from her. She also has a friend, Karen Barber, who will be getting discharged from rehab in December and will be coming to stay with her for which she is excited about. Her dad lives in [place] and is able to help as needed.      Symptom Severity, Assessment and Current Medication / Treatment:     Pain:  Pain mainly in the neck and upper back that has been exacerbated by her posture while in the hospital. Trying to manage by sitting up and lying down flat. PT/OT has been helping her with this. Also has pain and tingling in her feet and legs which she notes was secondary to diabetic neuropathy several years ago but has been exacerbated since hospitalization due to swelling.   - Currently on Gabapentin 100 mg TID, Tylenol 650 mg PRN q6hr, and scheduled Lidocaine patch   Shortness of breath:  SOB with any type of exertion causing tightness in chest with movement. Overall stable to slightly improved since admission. Limiting her ability to do much outside of the bed.   Nausea:  Episode of severe nausea with no vomiting on 10/7. Improved with IV Zofran and Compazine as well as ODT Zofran. She feels this due all of the medications and breathing treatments she is receiving   - Currently on Zofran 4 mg q8hr PRN ODT or IV   Constipation:  not an issue   Other symptoms:  insomnia - reports only 2-3 hours of sleep a night           Objective:       Function:  60% - Ambulation: Reduced / unable to do hobby or some housework, significant disease / Self-Care:Occasional assist as necessary / Intake:Normal or reduced / Level of Conscious: Full or confusion    Temp:  [36.2 ??C (97.2 ??F)-36.8 ??C (98.2 ??F)] 36.2 ??C (97.2 ??F)  Heart Rate:  [71-82] 71  SpO2 Pulse:  [71-80] 71  Resp:  [14-34] 30  BP: (88-106)/(57-75) 106/68  FiO2 (%):  [50 %] 50 %  SpO2:  [88 %-97 %] 96 %    Physical Exam:  Constitutional: sitting up in bed on phone, in NAD    Eyes: anicteric sclera, no discharge  ENMT: moist oral mucosa  Pulm: fine crackles noted  throughout, mild increased WOB on 8 L large bore [place] with conversation    CV: RRR, well perfused LE   Skin: dry and intact   Neuro: cognitive status normal    Psych: mood and affect - saddened but hopeful     Testing reviewed and interpreted:   None today    I personally spent 45 minutes face-to-face and non-face-to-face in the care of this patient, which includes all pre, intra, and post visit time on the date of service.  All documented time was specific to the E/M visit and does not include any procedures that may have been performed.     See ACP Note from today for additional billable service:  No.     Carolanne Grumbling, MD, [facility]  [facility] PGY-3  Pager: [phone]

## 2022-12-26 NOTE — Unmapped (Signed)
Fax requested, signed form to [employer] for oxygen.

## 2022-12-26 NOTE — Unmapped (Signed)
Infectious Disease ([facility]) Progress Note    Assessment & Plan:   Karen Barber is a 57 y.o. female whose presentation is complicated by bronchiectasis type 1 in the setting of pulmonary sarcoidosis (6L [place] home O2), HTN, T2DM, COPD, s/p LUL lobectomy, and asthma that presented to [facility] admitted to MICU after becoming hypoxic after holding home meds prior to PET scan. After 1 week diuresing ICU stabilized patient she was transferred to the MPCU.    Principal Problem:    Acute on chronic hypoxic respiratory failure (CMS-HCC)  Active Problems:    Pulmonary sarcoidosis (CMS-HCC)    Type 2 diabetes mellitus with hyperglycemia (CMS-HCC)    COPD (chronic obstructive pulmonary disease) (CMS-HCC)    Chronic pulmonary aspergillosis (CMS-HCC)    Pulmonary hypertension (CMS-HCC)    AKI (acute kidney injury) (CMS-HCC)    Active Problems  #Acute on Chronic Hypoxemic Respiratory Failure -   #Pulmonary Sarcoidosis -   #COPD/asthma -   #Bronchiectasis  Patient has notable history of pulmonary sarcoidosis, COPD, pulmonary hypertension and is on baseline oxygen 6 L.  Recent admission with pneumonia growing Serratia marcescens  s/p 1x Rocephin (8/18), Vancomycin (8/18-8/19), Zosyn (8/18-8/23) discharged on levofloxacin 500 mg ending on 8/31.  Patient held her medications only for the morning prior to an outpatient PET CT scan during which she became hypoxic to the 70s and was admitted to the MICU.  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 She received one dose of 125mg  of IV methylprednisolone. She was weaned from high flow nasal cannula to large bore nasal cannula after IV diuresis and a course of antibiotics for possible pneumonia with Vanco/cefepime later transition to levofloxacin with a total 1 week treatment with antibiotics. Started on vanc/cefepime for broad coverage --> stopped vanc and cefepime 9/12- then levaquin (9/12---9/16).  Patient was transferred to the floor on 9/18 after showing significant signs of improvement but still having occasional desaturations with ambulation. PET scan 9/18 is not indicative of active pulmonary sarcoidosis.  Acute exacerbation likely multifactorial including in not taking her home medications as per directed for airway clearance, volume overload, and higher O2 requirement chronically than the amount she has been using. RHC 9/20 showing significant pulm HTN disease with mean PA pressure of 47, for which pulmonology team started Iloprost. Notably, wedge pressure on that study was 5, which is reassuring against volume as driver of her respiratory status at that time. Patient has been improving in her O2 requirement, now at her home oxygen rate. Will transition back to home PO lasix dose to assess management with oral regimen.  -- Continue to wean O2 with goal saturations of 90-94% per pulmonology  -- Spot dosed PO Lasix 60 due to increased dyspneic symptoms, will hold given Cr  -- Consulted palliative for goals of care conversation as well as symptom management.  -- Plan to get oxymizer for her while admitted. Will need oxymizer at home  -- Continue home airway clearance (DuoNebs and HTS 3 times daily with [place])  -- Continue home Pulmicort, Brovana, Atrovent, as needed albuterol, and azithromycin  -- PT and OT consult  -- Standing weights daily     #Pulmonary hypertension - RV failure - orthostatic hypotension  Patient has history of pulmonary hypertension due to pulmonary sarcoidosis/COPD.  TTE this admission 9/17 with severely dilated right atrium with severe pulmonary hypertension PASP 86 mmHg.  Estimated right atrial pressure 5 to 10 mmHg.  On Lasix 80 mg twice daily at home.  Echo showed LVEF  greater than 55% and severe pulmonary hypertension no significant change since prior study.  Right heart cath showed severe pulmonary Hypertension, markedly elevated PVR (9.3 - 10.5) and low cardiac output / index. HRCT scan shows unchanged fibrotic pulmonary sarcoidosis with bilateral segmental and subsegmental fibrosis post left upper lobectomy with unchanged apical right upper lobe and superior segment left lower lobe bullae.   -- Continue inhaled iloprost 5 mcg 6 times a day q4h; please do not mix with other nebulizer solutions   -- Plan to transition to Tyvaso nebs on discharge.  Scheduled to arrive on 10/8, will trial inpatient  -- [facility] rep to do teaching prior to DC    #Acute kidney injury on CKD3 (downtrending)  On admission patient's creatinine 1.3 which appears to be baseline. Cr fluctuating in 1.5-1.8 range.  -- Renally dose medications  -- Daily BMP    #Secondary Hyperparathyroidism, Hypocalcemia, and Hyperphosphatemia  Findings are consistent with secondary hyperparathyroidism due to CKD.  Ionized calcium: 4.37 mg/dL (low)  Phosphate: 5.6 mg/dL (elevated)  PTH: 161.0 pg/mL (elevated)  Vitamin D Total (25OH) 35  -- Reconsider need for future oral calcium carbonate 1600 mg TID  -- Reconsider need for future phoslo 667 mg PO TID   -- Reassess PTH levels in 4-6 weeks.    #Type 2 diabetes mellitus with hyperglycemia and hypoglycemia  Hemoglobin A1c 8.3%.  Home regimen includes glargine 18 units nightly and sliding scale insulin.  Large fluctuations in glucose greater than 300 and occasional values ~50.  -- Sliding scale insulin insulin sensitivity factor 40  --12 glargine nightly  -- 3 units lispro tid     #Left leg swelling (resolved )  Patient endorses intermittent left > right leg swelling. Venous doppler unremarkable.      Chronic Problems     #Chronic Pain   - Resuming gabapentin, though at reduced dose  - PT/OT consulted, appreciate recs     #Chronic pulmonary aspergillosis  Patient has history of aspergilloma and is on chronic posaconazole. Soluble IL-2 receptor, Aspergillus Galactomannan Antigen, IgE checked 10/2022 all wnl  -Continue home posaconazole Ppx  -Outpatient evaluation for [facility] scheduled for 10/25 to evaluate recurrent hemoptysis     #Iron deficiency anemia  #Chronic normocytic anemia (baseline hemoglobin 11)  Patient has some mild normocytic anemia with hemoglobin of 11. Iron panel shows iron of 37 with iron saturation of 15.   --Iron supplementation with iron dextran 1,000mg  in 250cc    #Hyperlipidemia  Last lipid profile showed an LDL of 146  -- Follow-up outpatient    Issues Impacting Complexity of Management:  -The patient is at high risk for the development of complications of volume overload due to the need to provide IV hydration for suspected hypovolemia in the setting of: Pulmonary hypertension  -Need for the following intensive monitoring parameter(s) due to high risk of clinical decline: continuous oxygen monitoring, telemetry, and frequent monitoring of urine output to assess for the efficacy of diuretic regimen  -Need for intensive oxygen therapy of HFNC, which places the patient at high risk for oxygen toxicity  -The patient is at high risk of complications from pulmonary hypertension    Daily Checklist:  Diet: Regular Diet   DVT PPx: Lovenox 40mg  q24h  Electrolytes: Replete Potassium to >/= 3.6 and Magnesium to >/= 1.8  Code Status: DNR and DNI  Dispo:  Continue MPCU    Team Contact Information:   Primary Team: Infectious Disease ([facility])  Primary Resident: Zella Richer, MD  Resident's  Pager: [phone] Evelene Croon Disease Intern - [facility])    Interval History:     No acute events overnight.  Patient reports no concerns or complaints this morning.  Denies any burning sensation changes today, reporting that the gabapentin was helpful as well as improvement in her nausea.  Denies any shortness of breath with exertion, fevers, or chills.    ROS negative except for where otherwise noted above.    Objective:   Temp:  [36.2 ??C (97.2 ??F)-36.8 ??C (98.2 ??F)] 36.2 ??C (97.2 ??F)  Heart Rate:  [71-82] 71  SpO2 Pulse:  [71-80] 71  Resp:  [14-34] 30  BP: (88-106)/(57-75) 106/68  FiO2 (%):  [50 %] 50 %  SpO2:  [88 %-97 %] 96 %,   Intake/Output Summary (Last 24 hours) at 12/26/2022 1247  Last data filed at 12/26/2022 1233  Gross per 24 hour   Intake 150 ml   Output 450 ml   Net -300 ml   , FiO2 (%):  [50 %] 50 %  O2 Device: Large bore nasal cannula (cool high flow)  O2 Flow Rate (L/min):  [6 L/min-8 L/min] 8 L/min    Gen: NAD, converses well  HENT: Atraumatic, normocephalic  Heart: RRR  Lungs: Decreased breath sounds bilaterally in the lower lung fields  Abdomen: Soft, nontender nondistended  Extremities: Trace pedal edema

## 2022-12-26 NOTE — Unmapped (Signed)
Pt is A&Ox4. NSR with HR 70s-80s. Bps stable with systolic's 90s-110s. O2 sats stable on 6L LBNC and nonrebreather for recovery. Afebrile. No c/o of pain. Prn nausea medication was given twice this shift, along with tums. UO diminished, no BM this shift. Pt's appetite has been diminished, pt stated that she did not feel well enough to eat today. Skin intact, Q2H  and standard precautions maintained. No falls/injuries this shift. On SQ Lovenox for DVT prophylaxis. All monitors with appropriate alarm settings.     Problem: Adult Inpatient Plan of Care  Goal: Plan of Care Review  Outcome: Ongoing - Unchanged  Goal: Patient-Specific Goal (Individualized)  Outcome: Ongoing - Unchanged  Goal: Absence of Hospital-Acquired Illness or Injury  Intervention: Identify and Manage Fall Risk  Recent Flowsheet Documentation  Taken 12/25/2022 0800 by Linward Foster, RN  Safety Interventions:   aspiration precautions   commode/urinal/bedpan at bedside   environmental modification   fall reduction program maintained   infection management   lighting adjusted for tasks/safety   low bed   nonskid shoes/slippers when out of bed  Intervention: Prevent Infection  Recent Flowsheet Documentation  Taken 12/25/2022 0800 by Linward Foster, RN  Infection Prevention:   cohorting utilized   personal protective equipment utilized   rest/sleep promoted     Problem: Fall Injury Risk  Goal: Absence of Fall and Fall-Related Injury  Outcome: Ongoing - Unchanged  Intervention: Promote Scientist, clinical (histocompatibility and immunogenetics) Documentation  Taken 12/25/2022 0800 by Linward Foster, RN  Safety Interventions:   aspiration precautions   commode/urinal/bedpan at bedside   environmental modification   fall reduction program maintained   infection management   lighting adjusted for tasks/safety   low bed   nonskid shoes/slippers when out of bed     Problem: Gas Exchange Impaired  Goal: Optimal Gas Exchange  Outcome: Ongoing - Unchanged  Intervention: Optimize Oxygenation and Ventilation  Recent Flowsheet Documentation  Taken 12/25/2022 1559 by Linward Foster, RN  Head of Bed [facility]) Positioning: HOB at 30-45 degrees  Taken 12/25/2022 1230 by Winfred Redel Z, RN  Head of Bed [facility]) Positioning: HOB at 30-45 degrees  Taken 12/25/2022 1134 by Elke Holtry Z, RN  Head of Bed [facility]) Positioning: HOB at 30-45 degrees  Taken 12/25/2022 0751 by Linward Foster, RN  Head of Bed (HOB) Positioning: HOB at 30-45 degrees     Problem: Comorbidity Management  Goal: Blood Glucose Levels Within Targeted Range  Outcome: Ongoing - Unchanged

## 2022-12-26 NOTE — Unmapped (Signed)
Pulmonary Consult Service  Consult Follow Up Note      Primary Service:   Medicine  Primary Service Attending:  Ebony Hail, MD  Reason for Consult:   Assistance with diuresis    ASSESSMENT and PLAN     Karen Barber is a 57 y.o. 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 Pt was found to have acute on chronic respiratory failure (sat in 70s on home 6L) in the setting of not taking her home medications (sildenafil, lasix, nebs, inhalers) in preparation for PET scan, admitted 9/11. She was admitted to the ICU initially, has since been weaned back to 6-8L [place] with airway clearance and diuresis. Her acute on chronic hypoxemia and increase O2 requirement was likely multifactorial -- combination of volume overload as well as lack of airway clearance at home, however I think chronically her O2 requirement (related to her PH) is higher than the amount she has been on at home and she continues to have severe exertional hypoxemia probably due to her parenchymal disease, at least in part.      Working on weaning her back to her home oxygen requirement and initiating inhaled Tyvaso at discharge though unclear if this has helped with symptoms and/or oxygenation.  Continuing to have challenges managing volume status and renal function. She unfortunately has a tenuous balance and now has worsening renal function. She is unfortunately not a transplant candidate due to PA plasty per chart review/anatomic concerns related to this.     RV Failure - PAH (Group 3, 5)  RHC 9/20 demonstrated severe pulmonary hypertension, markedly elevated PVR (9.3-10.5) and low CO/CI  HRCT completed for PH-ILD diagnosis and Tyvaso enrollment sent to [facility]. She is planned to continue Tyvaso at discharge. Her oxygen requirements are approaching her baseline of 6L.     Plan:  -Would recommend holding lasix for now with worsening renal function - her oxygenation appears stable for now despite rising BNP  - Monitor renal function closely, consider renal US, urine lytes, strict I/O, nephrology consult if worsening  - Patient has been approved for PAP assistance (free drug for Tyvaso for one calendar year. [facility] has been notified and plan is to have meds shipped to [facility] or pt home. Medication teaching should occur on the day of discharge -previously planned for 10/8, on hold while volume status and oxygen requirements are optimized   - Continue sildenafil 40 mg TID  - Continue inhaled iloprost 5 mcg q4h. Please do not mix with other nebulizer solutions  - Continue daily weight with strict I/O  - cont daily pro-BNP    Acute on Chronic Hypoxemic Respiratory Failure  COPD/Asthma  Pulmonary Sarcoidosis c/b Bronchiectasis  Soluble IL-2 receptor, Aspergillus Galactomannan Antigen, IgE checked 10/2022 all wnl. PET scan 9/17 does not appear to be active pulmonary sarcoidosis and again it is overall felt to be burned out from sarcoidosis standpoint. She is now stable satting at >95% on 8-10L via [facility]. It is likely she has higher O2 requirement chronically than the amount she has been using at home. This may reflect her new baseline and this was discussed with the patient. Discussion had with the patient about more realistic oxygen sats for her and limits on activity at home to prevent desats.  Plan:  - Set O2 sat goal at 90-94%, wean oxygen as tolerated  - continue home pulmicort, brovana, PRN albuterol, azithromycin  - please cont airway clearance:   - duonebs TID and HTS (3%) BID with aerobika    Chronic Pulmonary Aspergillosis   Aspergillus Galactomannan Antigen, IgE checked 10/2022 all wnl.   - continue home posaconazole   - pt has outpt evaluation for [facility] scheduled for 10/25 for history of recurrent hemoptysis    Ms. Scipio was seen, examined and discussed with  Dr. Lurena Nida  Thank you for involving [place] in her care. We look forward to following with you.  Recommendations were communicated to the primary team at 10:14 AM  Please page [place] with any questions or concerns at [phone] (pulmonary consult fellow).    [employer]    I attest that I have reviewed the student note and that the components of the history of the present illness, the physical exam, and the assessment and plan documented were performed by me or were performed in my presence by the student where I verified the documentation and performed (or re-performed) the exam and medical decision making.   Kandis Fantasia, MD    HPI:     Interval history: Nausea improved.    History of Present Illness:  Karen Barber is a 57 y.o. female with hx pf pulmonary sarcoidosis, COPD, PAH (baseline home O2 6L), chronic pulmonary aspergillosis, s/p LUL lobectomy 2016 who initially presented to the hospital for a PET scan to evaluate her sarcoidosis.     Pt was found to have acute on chronic respiratory failure (sat in 70s on home 6L) in the setting of not taking her home medications (sildenafil, lasix, nebs, inhalers) in preparation for PET scan. As her respiratory status did not improve with NRB, placed on HFNC. 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 She received one dose of 125mg  of IV methylprednisolone and then was transferred to the MICU for further management of her hypoxia. Pt had a week long stay in the MICU and was weaned from HFNC to [facility] after IV diuresis and course of antibiotics (completed course of levaquin) for possible pneumonia. Pt has been continuing home airway clearance (Aerobika and HTS neb), and home pulmicort, brovana, atrovent, and PRN albuterol. Showing significant signs of improvement but still having desaturations with ambulation.     Pt started iloprost 9/19 and has tolerated it well so far. RHC 9/20 demonstrated severe pulmonary hypertension, markedly elevated PVR (9.3-10.5) and low cardiac output/index. HRCT chest showed unchanged fibrotic pulmonary sarcoidosis with bilateral segmental and subsegmental fibrosis post left upper lobectomy with unchanged apical right upper lobe and superior segment left lower lobe bullae.     Of note, has been hospitalized twice prior to this in the past 6 weeks.  8/18-8/25: presented with increased O2 requirement, increased swelling, brown sputum production.   - treated for serratia pneumonia (completed levaquin)  - Was discharged on AVAPS for home, as well as with HTS/albuterol and [place].  10/21/22-10/25/22: presented with DOE, up to 8-10L with exertion and desats to 70%.   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 A.niger from sputum 08/2016-09/2016.    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    Sarcoidosis 1995    Type 2 diabetes mellitus with hyperglycemia (CMS-HCC) 07/27/2014    Visual impairment     glasses     Past Surgical History:   Procedure Laterality Date    LUNG LOBECTOMY      PR AMPUTATION TOE,MT-P JT Right 12/12/2019    Procedure: Right foot fifth toe amputation at the metatarsophalangeal joint level;  Surgeon: Britt Bottom, DPM;  Location: MAIN OR [facility]; Service: Vascular    PR AMPUTATION TOE,MT-P JT Right 06/16/2020    Procedure: Right foot amputation of 3rd toe at mpj level;  Surgeon: Britt Bottom, DPM;  Location: MAIN OR [facility];  Service: Vascular    PR RIGHT HEART CATH O2 SATURATION & CARDIAC OUTPUT N/A 04/29/2019    Procedure: Right Heart Catheterization;  Surgeon: Rosana Hoes, MD;  Location: [facility] CATH;  Service: Cardiology    PR RIGHT HEART CATH O2 SATURATION & CARDIAC OUTPUT N/A 12/08/2022    Procedure: Right Heart Catheterization;  Surgeon: Neal Dy, MD;  Location: [facility] CATH;  Service: Cardiology    PR THORACOSCOPY SURG TOT PULM DECORT Left 12/14/2014    Procedure: THORACOSCOPY SURG; W/TOT PULM DECORTIC/PNEUMOLYS;  Surgeon: Evert Kohl, MD;  Location: MAIN OR [facility];  Service: Cardiothoracic    PR THORACOSCOPY W/THERA WEDGE RESEXN INITIAL UNILAT Left 12/03/2014    Procedure: THORACOSCOPY, SURGICAL; WITH THERAPEUTIC WEDGE RESECTION (EG, MASS, NODULE) INITIAL UNILATERAL;  Surgeon: Evert Kohl, MD;  Location: MAIN OR [facility];  Service: Cardiothoracic       Other History:  Family History   Problem Relation Age of Onset    Dementia Mother     Diabetes Father     Diabetes Brother     Diabetes Maternal Aunt     Sarcoidosis Maternal Aunt     Diabetes Maternal Uncle     Diabetes Paternal Aunt     Diabetes Paternal Uncle      Social History     Socioeconomic History    Marital status: Single   Tobacco Use    Smoking status: Former     Current packs/day: 0.00     Average packs/day: 0.5 packs/day for 9.0 years (4.5 ttl pk-yrs)     Types: Cigarettes     Start date: 05/02/1978     Quit date: 05/03/1987     Years since quitting: 35.6    Smokeless tobacco: Never   Vaping Use    Vaping status: Never Used   Substance and Sexual Activity    Alcohol use: No     Alcohol/week: 0.0 standard drinks of alcohol    Drug use: No    Sexual activity: Yes   Other Topics Concern    Do you use sunscreen? No    Tanning bed use? No    Are you easily burned? No    Excessive sun exposure? No    Blistering sunburns?  No     Social Determinants of Health     Financial Resource Strain: Low Risk  (11/06/2022)    Overall Financial Resource Strain (CARDIA)     Difficulty of Paying Living Expenses: Not hard at all   Food Insecurity: No Food Insecurity (11/06/2022)    Hunger Vital Sign     Worried About Running Out of Food in the Last Year: Never true     Ran Out of Food in the Last Year: Never true   Transportation Needs: No Transportation Needs (11/06/2022)    PRAPARE - Therapist, art (Medical): No     Lack of Transportation (Non-Medical): No       Home Medications:  No current facility-administered medications on file prior to encounter.     Current Outpatient Medications on File Prior to Encounter   Medication Sig Dispense Refill    ACCU-CHEK GUIDE GLUCOSE METER Misc 1 Device by Other route Four (4) times a day (before meals and nightly). AS DIRECTED 1 kit 0    albuterol 2.5 mg /3 mL (0.083 %) nebulizer solution Inhale 3 mL (2.5 mg total) by nebulization every six (6) hours as needed for wheezing or shortness of breath (airway clearance/cough). 360 mL 3    albuterol HFA 90 mcg/actuation inhaler Inhale 2 puffs every four (4) hours as needed for wheezing or shortness of breath. 54 g 0    arformoterol (BROVANA) 15 mcg/2 mL Inhale 2 mL (15 mcg total) by nebulization in the morning and 2 mL (15 mcg total) in the evening. 120 mL 2    aspirin-caffeine (BAYER BACK AND BODY) 500-32.5 mg Tab Take by mouth. Takes 2 1/2 pills 3-4 times a day as needed.      azithromycin (ZITHROMAX) 250 MG tablet Take 1 tablet (250 mg total) by mouth daily. Start on 9/1 after completing the levofloxacin 90 tablet 0    blood sugar diagnostic Strp by Other route Three (3) times a day. ACCU-CHEK Guide meter 300 strip 1    budesonide (PULMICORT) 0.5 mg/2 mL nebulizer solution Inhale 2 mL (0.5 mg total) by nebulization in the morning and 2 mL (0.5 mg total) in the evening. 120 mL 2    furosemide (LASIX) 80 MG tablet Take 1 tablet (80 mg total) by mouth two (2) times a day. Take a third dose as needed for increasing weight or increasing swelling. 60 tablet 0    gabapentin (NEURONTIN) 300 MG capsule Take 1 capsule (300 mg total) by mouth Three (3) times a day. 270 capsule 3    insulin aspart (NOVOLOG U-100 INSULIN ASPART) 100 unit/mL vial Inject 0.01-0.2 mL (1-20 Units total) under the skin Four (4) times a day (before meals and nightly). SLIDING SCALE 20 mL 1    insulin glargine (BASAGLAR KWIKPEN U-100 INSULIN) 100 unit/mL (3 mL) injection pen Inject 0.18 mL (18 Units total) under the skin nightly. 15 mL 0    ipratropium (ATROVENT) 0.02 % nebulizer solution Inhale 2.5 mL (500 mcg total) by nebulization four (4) times a day. 300 mL 0    posaconazole (NOXAFIL) 100 mg delayed released tablet Take 2 tablets by mouth once daily 60 tablet 0    sildenafiL, pulm.hypertension, (REVATIO) 20 mg tablet Take 1 tablet (20 mg total) by mouth Three (3) times a day. 90 tablet 0    OXYGEN-AIR DELIVERY SYSTEMS MISC Dose: 2-3 LPM         In-Hospital Medications:  Scheduled:   arformoterol  15 mcg Nebulization BID (RT)    azithromycin  250 mg Oral Daily    budesonide  0.5 mg Nebulization BID (RT)    diclofenac sodium  2 g Topical QID    enoxaparin (LOVENOX) injection  40 mg Subcutaneous Nightly    flu vacc [id number] 6mos up(PF)  0.5 mL Intramuscular During hospitalization    [Provider Hold] furosemide  80 mg Oral BID    gabapentin  100 mg Oral TID    iloprost  5 mcg Nebulization 6XD    insulin glargine  12 Units Subcutaneous Nightly    insulin lispro  0-20 Units Subcutaneous ACHS    insulin lispro  3 Units Subcutaneous 3xd Meals    ipratropium-albuterol  3 mL Nebulization TID (RT)    lidocaine  1 patch Transdermal Daily    magnesium oxide-Mg AA chelate  2 tablet Oral BID    polyethylene glycol  17 g Oral Daily    posaconazole  200 mg Oral Daily    sildenafiL (pulm.hypertension)  40 mg Oral TID    sodium chloride  4 mL Nebulization BID (RT)         PRN Medications:  acetaminophen, albuterol, calcium carbonate, dextrose in water, glucagon, melatonin, [EXPIRED] Implement **AND** [EXPIRED] Care order/instruction **AND** [EXPIRED] Vital signs **AND** Adult Oxygen therapy **AND** [EXPIRED] sodium chloride **AND** [EXPIRED] sodium chloride 0.9% **AND** [EXPIRED] diphenhydrAMINE **AND** [EXPIRED] famotidine **AND** methylPREDNISolone sodium succinate **AND** [EXPIRED] EPINEPHrine, ondansetron **OR** ondansetron, senna, sodium chloride    Allergies:  Allergies as of 11/29/2022    (No Known Allergies)         PHYSICAL EXAM:   BP 92/60  - Pulse 73  - Temp 36.4 ??C (97.5 ??F) (Oral)  - Resp 14  - Ht 180.3 cm (5' 11)  - Wt 87.7 kg (193 lb 5.5 oz)  - LMP  (LMP Unknown)  - SpO2 97%  - BMI 26.97 kg/m??   General: Alert, well-appearing, and in no distress.  Eyes: Anicteric sclera, conjunctiva clear.  ENT:   no drainage.  Cardiovascular: Regular rate and rhythm.  Pulmonary: Diminished lung sounds in bilateral upper lobes, crackles bilateral bases  Musculoskeletal: No clubbing. trace LE edema  Skin: No rashes or lesions.,    Neuro: No focal neurological deficits.    LABORATORY and RADIOLOGY DATA:     Pertinent Laboratory Data:  Lab Results   Component Value Date    WBC 3.4 (L) 12/26/2022    HGB 11.1 (L) 12/26/2022    HCT 34.6 12/26/2022    PLT 160 12/26/2022       Lab Results   Component Value Date    NA 139 12/26/2022    K 4.8 12/26/2022    CL 97 (L) 12/26/2022    CO2 36.0 (H) 12/26/2022    BUN 43 (H) 12/26/2022    CREATININE 2.41 (H) 12/26/2022    GLU 87 12/26/2022    CALCIUM 10.1 12/26/2022    MG 2.4 12/26/2022    PHOS 5.3 (H) 12/25/2022       Lab Results   Component Value Date    BILITOT 0.7 12/25/2022    BILIDIR <0.10 05/30/2019    PROT 7.3 12/25/2022    ALBUMIN 3.6 12/25/2022    ALT 20 12/25/2022    AST 27 12/25/2022    ALKPHOS 91 12/25/2022    GGT 60 (H) 05/31/2011       Lab Results   Component Value Date    INR 1.02 12/11/2019 APTT 30.9 12/11/2019  Pertinent Micro Data:  Microbiology Results (last day)       ** No results found for the last 24 hours. **             Pertinent Imaging Data:  9/11 CXR   - Extensive fibrotic changes and biapical bulla appear grossly unchanged. Extensive pulmonary fibrosis limits evaluation for superimposed infection.     9/17 Echo    Summary    1. Limited study.    2. The left ventricle is normal in size with normal wall thickness.    3. The left ventricular systolic function is normal, LVEF is visually  estimated at > 55%.    4. The right ventricle is severely dilated in size, with mildly reduced  systolic function.    5. There is severe pulmonary hypertension.    6. TR maximum velocity: 4.4 m/s  Estimated PASP: 86 mmHg.    7. IVC size and inspiratory change suggest mildly elevated right atrial  pressure. (5-10 mmHg).    8. No significant change since prior study from 10/23/2022    9/18 PET CT  Impression   Bulky, partially calcified lymph nodes in the mediastinum and bilateral hilar with no significant to mild uptake, decreased in intensity since prior.   Grossly unchanged metabolically active right supraclavicular lymph nodes, accounting for differences in positioning of the arms and lack of IV contrast.   Bronchiectasis and partially metabolically active reticular opacities are seen bilaterally, most prominent in the lower lobes, overall similar to prior.   Dilated main pulmonary artery measuring up to 4.0 cm, suggestive of pulmonary arterial hypertension.     9/20 RHC  FINAL CARDIAC CATHETERIZATION REPORT    CONCLUSIONS:  - Severe pulmonary Hypertension  - Markedly elevated PVR (9.3 - 10.5)  - Low cardiac output / index     9/25 CXR  No significant interval change since the prior chest radiograph.     9/25 CT Chest High Resolution w/o contrast  Unchanged fibrotic pulmonary sarcoidosis with bilateral segmental and subsegmental fibrosis post left upper lobectomy with unchanged apical right upper lobe and superior segment left lower lobe bullae.     9/27 CXR  Fibrotic interstitial lung disease with biapical large bullae which are better characterized on the prior CT chest. No significant interval changes.     9/28 CXR  Fibrotic interstitial lung disease with biapical large bullae which are better characterized on the prior CT chest. No significant interval changes.    10/4 CXR  Unchanged combined pulmonary fibrosis and emphysema with biapical bullae.     10/7 CXR  Little interval change from prior.

## 2022-12-27 LAB — BASIC METABOLIC PANEL
ANION GAP: 3 mmol/L — ABNORMAL LOW (ref 5–14)
ANION GAP: 6 mmol/L (ref 5–14)
BLOOD UREA NITROGEN: 50 mg/dL — ABNORMAL HIGH (ref 9–23)
BLOOD UREA NITROGEN: 48 mg/dL — ABNORMAL HIGH (ref 9–23)
BUN / CREAT RATIO: 17
BUN / CREAT RATIO: 16
CALCIUM: 9.6 mg/dL (ref 8.7–10.4)
CALCIUM: 9.7 mg/dL (ref 8.7–10.4)
CHLORIDE: 97 mmol/L — ABNORMAL LOW (ref 98–107)
CHLORIDE: 96 mmol/L — ABNORMAL LOW (ref 98–107)
CO2: 36 mmol/L — ABNORMAL HIGH (ref 20.0–31.0)
CO2: 34 mmol/L — ABNORMAL HIGH (ref 20.0–31.0)
CREATININE: 2.95 mg/dL — ABNORMAL HIGH
CREATININE: 3 mg/dL — ABNORMAL HIGH
EGFR CKD-EPI (2021) FEMALE: 18 mL/min/{1.73_m2} — ABNORMAL LOW (ref >=60)
EGFR CKD-EPI (2021) FEMALE: 17 mL/min/{1.73_m2} — ABNORMAL LOW (ref >=60)
GLUCOSE RANDOM: 124 mg/dL (ref 70–179)
GLUCOSE RANDOM: 115 mg/dL (ref 70–179)
POTASSIUM: 4.9 mmol/L — ABNORMAL HIGH (ref 3.4–4.8)
POTASSIUM: 4.8 mmol/L (ref 3.4–4.8)
SODIUM: 136 mmol/L (ref 135–145)
SODIUM: 136 mmol/L (ref 135–145)

## 2022-12-27 LAB — CBC
HEMATOCRIT: 33.7 % — ABNORMAL LOW (ref 34.0–44.0)
HEMOGLOBIN: 10.9 g/dL — ABNORMAL LOW (ref 11.3–14.9)
MEAN CORPUSCULAR HEMOGLOBIN CONC: 32.4 g/dL (ref 32.0–36.0)
MEAN CORPUSCULAR HEMOGLOBIN: 27.3 pg (ref 25.9–32.4)
MEAN CORPUSCULAR VOLUME: 84.2 fL (ref 77.6–95.7)
MEAN PLATELET VOLUME: 8.9 fL (ref 6.8–10.7)
PLATELET COUNT: 137 10*9/L — ABNORMAL LOW (ref 150–450)
RED BLOOD CELL COUNT: 4 10*12/L (ref 3.95–5.13)
RED CELL DISTRIBUTION WIDTH: 17.2 % — ABNORMAL HIGH (ref 12.2–15.2)
WBC ADJUSTED: 3.1 10*9/L — ABNORMAL LOW (ref 3.6–11.2)

## 2022-12-27 LAB — UREA NITROGEN, URINE: UREA NITROGEN URINE: 468 mg/dL

## 2022-12-27 LAB — MAGNESIUM
MAGNESIUM: 2.5 mg/dL (ref 1.6–2.6)
MAGNESIUM: 2.5 mg/dL (ref 1.6–2.6)

## 2022-12-27 LAB — LACTATE, VENOUS, WHOLE BLOOD: LACTATE BLOOD VENOUS: 1.6 mmol/L (ref 0.5–1.8)

## 2022-12-27 LAB — POTASSIUM, URINE, RANDOM: POTASSIUM URINE: 42.7 mmol/L

## 2022-12-27 LAB — SODIUM, URINE, RANDOM: SODIUM URINE: 34 mmol/L

## 2022-12-27 LAB — CREATININE, URINE: CREATININE, URINE: 166.5 mg/dL

## 2022-12-27 LAB — PRO-BNP: PRO-BNP: 8309 pg/mL — ABNORMAL HIGH (ref <=300.0)

## 2022-12-27 MED ADMIN — iloprost (VENTAVIS) nebulizer solution: 5 ug | RESPIRATORY_TRACT | @ 12:00:00

## 2022-12-27 MED ADMIN — gabapentin (NEURONTIN) capsule 100 mg: 100 mg | ORAL | @ 13:00:00

## 2022-12-27 MED ADMIN — enoxaparin (LOVENOX) syringe 40 mg: 40 mg | SUBCUTANEOUS | @ 01:00:00

## 2022-12-27 MED ADMIN — budesonide (PULMICORT) nebulizer solution 0.5 mg: .5 mg | RESPIRATORY_TRACT | @ 01:00:00

## 2022-12-27 MED ADMIN — magnesium oxide-Mg AA chelate (Magnesium Plus Protein) 2 tablet: 2 | ORAL | @ 01:00:00

## 2022-12-27 MED ADMIN — acetaminophen (TYLENOL) tablet 650 mg: 650 mg | ORAL | @ 15:00:00

## 2022-12-27 MED ADMIN — diclofenac sodium (VOLTAREN) 1 % gel 2 g: 2 g | TOPICAL | @ 10:00:00

## 2022-12-27 MED ADMIN — acetaminophen (TYLENOL) tablet 650 mg: 650 mg | ORAL | @ 21:00:00

## 2022-12-27 MED ADMIN — iloprost (VENTAVIS) nebulizer solution: 5 ug | RESPIRATORY_TRACT | @ 17:00:00

## 2022-12-27 MED ADMIN — insulin glargine (LANTUS) injection 12 Units: 12 [IU] | SUBCUTANEOUS | @ 01:00:00

## 2022-12-27 MED ADMIN — iloprost (VENTAVIS) nebulizer solution: 5 ug | RESPIRATORY_TRACT | @ 22:00:00

## 2022-12-27 MED ADMIN — gabapentin (NEURONTIN) capsule 100 mg: 100 mg | ORAL | @ 17:00:00

## 2022-12-27 MED ADMIN — iloprost (VENTAVIS) nebulizer solution: 5 ug | RESPIRATORY_TRACT | @ 01:00:00

## 2022-12-27 MED ADMIN — diclofenac sodium (VOLTAREN) 1 % gel 2 g: 2 g | TOPICAL | @ 15:00:00

## 2022-12-27 MED ADMIN — arformoterol (BROVANA) nebulizer solution 15 mcg/2 mL: 15 ug | RESPIRATORY_TRACT | @ 12:00:00

## 2022-12-27 MED ADMIN — ipratropium-albuterol (DUO-NEB) 0.5-2.5 mg/3 mL nebulizer solution 3 mL: 3 mL | RESPIRATORY_TRACT

## 2022-12-27 MED ADMIN — budesonide (PULMICORT) nebulizer solution 0.5 mg: .5 mg | RESPIRATORY_TRACT | @ 12:00:00

## 2022-12-27 MED ADMIN — diclofenac sodium (VOLTAREN) 1 % gel 2 g: 2 g | TOPICAL | @ 21:00:00

## 2022-12-27 MED ADMIN — arformoterol (BROVANA) nebulizer solution 15 mcg/2 mL: 15 ug | RESPIRATORY_TRACT | @ 01:00:00

## 2022-12-27 MED ADMIN — gabapentin (NEURONTIN) capsule 100 mg: 100 mg | ORAL | @ 01:00:00

## 2022-12-27 MED ADMIN — sildenafiL (pulm.hypertension) (REVATIO) tablet 40 mg: 40 mg | ORAL | @ 13:00:00

## 2022-12-27 MED ADMIN — calcium carbonate (TUMS) chewable tablet 200 mg elem calcium: 200 mg | ORAL | @ 02:00:00

## 2022-12-27 MED ADMIN — iloprost (VENTAVIS) nebulizer solution: 5 ug | RESPIRATORY_TRACT | @ 19:00:00

## 2022-12-27 MED ADMIN — acetaminophen (TYLENOL) tablet 650 mg: 650 mg | ORAL | @ 02:00:00

## 2022-12-27 MED ADMIN — albuterol 2.5 mg /3 mL (0.083 %) nebulizer solution 2.5 mg: 2.5 mg | RESPIRATORY_TRACT | @ 17:00:00

## 2022-12-27 MED ADMIN — iloprost (VENTAVIS) nebulizer solution: 5 ug | RESPIRATORY_TRACT | @ 04:00:00

## 2022-12-27 MED ADMIN — calcium carbonate (TUMS) chewable tablet 200 mg elem calcium: 200 mg | ORAL | @ 19:00:00

## 2022-12-27 MED ADMIN — posaconazole (NOXAFIL) delayed released tablet 200 mg: 200 mg | ORAL | @ 13:00:00

## 2022-12-27 MED ADMIN — sildenafiL (pulm.hypertension) (REVATIO) tablet 40 mg: 40 mg | ORAL | @ 17:00:00

## 2022-12-27 MED ADMIN — lidocaine (ASPERCREME) 4 % 1 patch: 1 | TRANSDERMAL | @ 13:00:00

## 2022-12-27 MED ADMIN — sildenafiL (pulm.hypertension) (REVATIO) tablet 40 mg: 40 mg | ORAL | @ 01:00:00

## 2022-12-27 MED ADMIN — azithromycin (ZITHROMAX) tablet 250 mg: 250 mg | ORAL | @ 13:00:00 | Stop: 2025-08-25

## 2022-12-27 MED ADMIN — furosemide (LASIX) injection 80 mg: 80 mg | INTRAVENOUS | @ 19:00:00 | Stop: 2022-12-27

## 2022-12-27 MED ADMIN — diclofenac sodium (VOLTAREN) 1 % gel 2 g: 2 g | TOPICAL | @ 01:00:00

## 2022-12-27 MED ADMIN — acetaminophen (TYLENOL) tablet 650 mg: 650 mg | ORAL | @ 10:00:00

## 2022-12-27 MED ADMIN — magnesium oxide-Mg AA chelate (Magnesium Plus Protein) 2 tablet: 2 | ORAL | @ 13:00:00

## 2022-12-27 MED ADMIN — sodium chloride 3 % NEBULIZER solution 4 mL: 4 mL | RESPIRATORY_TRACT

## 2022-12-27 MED ADMIN — ipratropium-albuterol (DUO-NEB) 0.5-2.5 mg/3 mL nebulizer solution 3 mL: 3 mL | RESPIRATORY_TRACT | @ 12:00:00

## 2022-12-27 MED ADMIN — insulin lispro (HumaLOG) injection 3 Units: 3 [IU] | SUBCUTANEOUS | @ 19:00:00

## 2022-12-27 MED ADMIN — ondansetron (ZOFRAN-ODT) disintegrating tablet 4 mg: 4 mg | ORAL | @ 13:00:00

## 2022-12-27 MED ADMIN — sodium chloride 3 % NEBULIZER solution 4 mL: 4 mL | RESPIRATORY_TRACT | @ 12:00:00

## 2022-12-27 NOTE — Unmapped (Signed)
Infectious Disease ([facility]) Progress Note    Assessment & Plan:   Karen Barber is a 57 y.o. female whose presentation is complicated by bronchiectasis type 1 in the setting of pulmonary sarcoidosis (6L [place] home O2), HTN, T2DM, COPD, s/p LUL lobectomy, and asthma that presented to [facility] admitted to MICU after becoming hypoxic after holding home meds prior to PET scan. After 1 week diuresing ICU stabilized patient she was transferred to the MPCU.    Principal Problem:    Acute on chronic hypoxic respiratory failure (CMS-HCC)  Active Problems:    Pulmonary sarcoidosis (CMS-HCC)    Type 2 diabetes mellitus with hyperglycemia (CMS-HCC)    COPD (chronic obstructive pulmonary disease) (CMS-HCC)    Chronic pulmonary aspergillosis (CMS-HCC)    Pulmonary hypertension (CMS-HCC)    AKI (acute kidney injury) (CMS-HCC)    Active Problems  #Acute on Chronic Hypoxemic Respiratory Failure -   #Pulmonary Sarcoidosis -   #COPD/asthma -   #Bronchiectasis  Patient has notable history of pulmonary sarcoidosis, COPD, pulmonary hypertension and is on baseline oxygen 6 L.  Recent admission with pneumonia growing Serratia marcescens  s/p 1x Rocephin (8/18), Vancomycin (8/18-8/19), Zosyn (8/18-8/23) discharged on levofloxacin 500 mg ending on 8/31.  Patient held her medications only for the morning prior to an outpatient PET CT scan during which she became hypoxic to the 70s and was admitted to the MICU.  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 She received one dose of 125mg  of IV methylprednisolone. She was weaned from high flow nasal cannula to large bore nasal cannula after IV diuresis and a course of antibiotics for possible pneumonia with Vanco/cefepime later transition to levofloxacin with a total 1 week treatment with antibiotics. Started on vanc/cefepime for broad coverage --> stopped vanc and cefepime 9/12- then levaquin (9/12---9/16).  Patient was transferred to the floor on 9/18 after showing significant signs of improvement but still having occasional desaturations with ambulation. PET scan 9/18 is not indicative of active pulmonary sarcoidosis.  Acute exacerbation likely multifactorial including in not taking her home medications as per directed for airway clearance, volume overload, and higher O2 requirement chronically than the amount she has been using. RHC 9/20 showing significant pulm HTN disease with mean PA pressure of 47, for which pulmonology team started Iloprost. Notably, wedge pressure on that study was 5, which is reassuring against volume as driver of her respiratory status at that time. Patient has been improving in her O2 requirement, now at her home oxygen rate. Will transition back to home PO lasix dose to assess management with oral regimen.  -- Continue to wean O2 with goal saturations of 90-94% per pulmonology  -- Consulted nephrology, who recommend holding diuretics, and pulmonology, who recommend 80 IV Lasix.  -- Continue to hold diuretics at this time iso continued consultant discussion  -- Consulted palliative for goals of care conversation as well as symptom management.  -- Plan to get oxymizer for her while admitted. Will need oxymizer at home  -- Continue home airway clearance (DuoNebs and HTS 3 times daily with [place])  -- Continue home Pulmicort, Brovana, Atrovent, as needed albuterol, and azithromycin  -- PT and OT consult  -- Standing weights daily     #Pulmonary hypertension - RV failure - orthostatic hypotension  Patient has history of pulmonary hypertension due to pulmonary sarcoidosis/COPD.  TTE this admission 9/17 with severely dilated right atrium with severe pulmonary hypertension PASP 86 mmHg.  Estimated right atrial pressure 5 to 10 mmHg.  On Lasix 80 mg twice daily at home.  Echo showed LVEF greater than 55% and severe pulmonary hypertension no significant change since prior study.  Right heart cath showed severe pulmonary Hypertension, markedly elevated PVR (9.3 - 10.5) and low cardiac output / index. HRCT scan shows unchanged fibrotic pulmonary sarcoidosis with bilateral segmental and subsegmental fibrosis post left upper lobectomy with unchanged apical right upper lobe and superior segment left lower lobe bullae.   -- Continue inhaled iloprost 5 mcg 6 times a day q4h; please do not mix with other nebulizer solutions   -- Plan to transition to Tyvaso nebs on discharge.  Scheduled to arrive on 10/8, will trial inpatient  -- [facility] rep to do teaching prior to DC    #Acute kidney injury on CKD3 (downtrending)  On admission patient's creatinine 1.3 which appears to be baseline. Cr fluctuating in 1.5-1.8 range.  -- Renally dose medications  -- Daily BMP    #Secondary Hyperparathyroidism, Hypocalcemia, and Hyperphosphatemia  Findings are consistent with secondary hyperparathyroidism due to CKD.  Ionized calcium: 4.37 mg/dL (low)  Phosphate: 5.6 mg/dL (elevated)  PTH: 244.0 pg/mL (elevated)  Vitamin D Total (25OH) 35  -- Reconsider need for future oral calcium carbonate 1600 mg TID  -- Reconsider need for future phoslo 667 mg PO TID   -- Reassess PTH levels in 4-6 weeks.    #Type 2 diabetes mellitus with hyperglycemia and hypoglycemia  Hemoglobin A1c 8.3%.  Home regimen includes glargine 18 units nightly and sliding scale insulin.  Large fluctuations in glucose greater than 300 and occasional values ~50.  -- Sliding scale insulin insulin sensitivity factor 40  --12 glargine nightly  -- 3 units lispro tid     #Left leg swelling (resolved )  Patient endorses intermittent left > right leg swelling. Venous doppler unremarkable.      Chronic Problems     #Chronic Pain   - Resuming gabapentin, though at reduced dose  - PT/OT consulted, appreciate recs     #Chronic pulmonary aspergillosis  Patient has history of aspergilloma and is on chronic posaconazole. Soluble IL-2 receptor, Aspergillus Galactomannan Antigen, IgE checked 10/2022 all wnl  -Continue home posaconazole Ppx  -Outpatient evaluation for [facility] scheduled for 10/25 to evaluate recurrent hemoptysis     #Iron deficiency anemia  #Chronic normocytic anemia (baseline hemoglobin 11)  Patient has some mild normocytic anemia with hemoglobin of 11. Iron panel shows iron of 37 with iron saturation of 15.   --Iron supplementation with iron dextran 1,000mg  in 250cc    #Hyperlipidemia  Last lipid profile showed an LDL of 146  -- Follow-up outpatient    Issues Impacting Complexity of Management:  -The patient is at high risk for the development of complications of volume overload due to the need to provide IV hydration for suspected hypovolemia in the setting of: Pulmonary hypertension  -Need for the following intensive monitoring parameter(s) due to high risk of clinical decline: continuous oxygen monitoring, telemetry, and frequent monitoring of urine output to assess for the efficacy of diuretic regimen  -Need for intensive oxygen therapy of HFNC, which places the patient at high risk for oxygen toxicity  -The patient is at high risk of complications from pulmonary hypertension    Daily Checklist:  Diet: Regular Diet   DVT PPx: Lovenox 40mg  q24h  Electrolytes: Replete Potassium to >/= 3.6 and Magnesium to >/= 1.8  Code Status: DNR and DNI  Dispo:  Continue MPCU    Team Contact Information:   Primary Team:  Infectious Disease ([facility])  Primary Resident: Zella Richer, MD  Resident's Pager: [phone] Evelene Croon Disease Intern - [facility])    Interval History:     No acute events overnight.  Patient reports no concerns or complaints this morning.  Reports improvement in her nausea as well as significant improvement in the burning sensation she had prior in her legs.  Reports that the gabapentin has improved her symptoms.  Denies any worsening shortness of breath, fevers, chills, body aches, abdominal pain.    ROS negative except for where otherwise noted above.    Objective:   Temp:  [36 ??C (96.8 ??F)-36.9 ??C (98.4 ??F)] 36.3 ??C (97.4 ??F)  Heart Rate:  [69-82] 69  SpO2 Pulse: [68-86] 68  Resp:  [17-31] 25  BP: (90-110)/(48-77) 101/77  FiO2 (%):  [60 %] 60 %  SpO2:  [88 %-100 %] 100 %,   Intake/Output Summary (Last 24 hours) at 12/27/2022 1326  Last data filed at 12/27/2022 1200  Gross per 24 hour   Intake 590 ml   Output 500 ml   Net 90 ml   , FiO2 (%):  [60 %] 60 %  O2 Device: Large bore nasal cannula (cool high flow)  O2 Flow Rate (L/min):  [6 L/min-10 L/min] 9 L/min    Gen: NAD, converses well  HENT: Atraumatic, normocephalic  Heart: RRR  Lungs: Decreased breath sounds bilaterally in the lower lung fields  Abdomen: Soft, nontender nondistended  Extremities: Trace pedal edema

## 2022-12-27 NOTE — Unmapped (Signed)
SBP in the 90's. On LBNC at 8L. Attempted to wean O2 down but pt desaturated to low 80's. Placed back on 8L with no desaturation noted. Pt with no urine output since start of shift. Bladder scan with 332 ml volume in bladder. MD notified and encourage pt to urinate which she urinated about 200 ml. Urine electrolytes send to lab. No c/o discomfort.   Problem: Adult Inpatient Plan of Care  Goal: Plan of Care Review  Outcome: Ongoing - Unchanged  Goal: Patient-Specific Goal (Individualized)  Outcome: Ongoing - Unchanged  Goal: Absence of Hospital-Acquired Illness or Injury  Outcome: Ongoing - Unchanged  Intervention: Identify and Manage Fall Risk  Recent Flowsheet Documentation  Taken 12/26/2022 2000 by Greggory Stallion, RN  Safety Interventions:   commode/urinal/bedpan at bedside   fall reduction program maintained   lighting adjusted for tasks/safety   low bed  Intervention: Prevent Skin Injury  Recent Flowsheet Documentation  Taken 12/26/2022 2015 by Greggory Stallion, RN  Positioning for Skin: (pt able to turn) Other (Comment)  Device Skin Pressure Protection: absorbent pad utilized/changed  Skin Protection:   adhesive use limited   tubing/devices free from skin contact  Taken 12/26/2022 2000 by Greggory Stallion, RN  Positioning for Skin: (pt able to turn) Other (Comment)  Device Skin Pressure Protection: absorbent pad utilized/changed  Skin Protection: adhesive use limited  Intervention: Prevent Infection  Recent Flowsheet Documentation  Taken 12/26/2022 2000 by Greggory Stallion, RN  Infection Prevention:   rest/sleep promoted   single patient room provided   hand hygiene promoted  Goal: Optimal Comfort and Wellbeing  Outcome: Ongoing - Unchanged  Goal: Readiness for Transition of Care  Outcome: Ongoing - Unchanged  Goal: Rounds/Family Conference  Outcome: Ongoing - Unchanged     Problem: Fall Injury Risk  Goal: Absence of Fall and Fall-Related Injury  Outcome: Ongoing - Unchanged  Intervention: Promote Scientist, clinical (histocompatibility and immunogenetics) Documentation  Taken 12/26/2022 2000 by Greggory Stallion, RN  Safety Interventions:   commode/urinal/bedpan at bedside   fall reduction program maintained   lighting adjusted for tasks/safety   low bed     Problem: Gas Exchange Impaired  Goal: Optimal Gas Exchange  Outcome: Ongoing - Unchanged  Intervention: Optimize Oxygenation and Ventilation  Recent Flowsheet Documentation  Taken 12/27/2022 0400 by Jalecia Leon, RN  Head of Bed [facility]) Positioning: HOB at 30 degrees  Taken 12/27/2022 0000 by Pricilla Moehle, RN  Head of Bed [facility]) Positioning: HOB at 30 degrees  Taken 12/26/2022 2200 by Rosario Kushner, RN  Head of Bed [facility]) Positioning: HOB at 30 degrees  Taken 12/26/2022 2000 by Agape Hardiman, RN  Head of Bed (HOB) Positioning: HOB at 30-45 degrees     Problem: Comorbidity Management  Goal: Blood Glucose Levels Within Targeted Range  Outcome: Ongoing - Unchanged  Intervention: Monitor and Manage Glycemia  Recent Flowsheet Documentation  Taken 12/27/2022 0200 by Bridgett Hattabaugh, RN  Glycemic Management: blood glucose monitored  Taken 12/27/2022 0000 by Greggory Stallion, RN  Glycemic Management: blood glucose monitored  Taken 12/26/2022 2200 by Ankush Gintz, RN  Glycemic Management: blood glucose monitored  Taken 12/26/2022 2000 by Governor Matos, RN  Glycemic Management: blood glucose monitored  Goal: Blood Pressure in Desired Range  Outcome: Ongoing - Unchanged     Problem: Skin Injury Risk Increased  Goal: Skin Health and Integrity  Outcome: Ongoing - Unchanged  Intervention: Optimize Skin Protection  Recent Flowsheet Documentation  Taken 12/27/2022 0400 by Katheline Brendlinger, RN  Head of Bed (HOB)  Positioning: HOB at 30 degrees  Taken 12/27/2022 0000 by Greggory Stallion, RN  Activity Management: up ad lib  Head of Bed [facility]) Positioning: HOB at 30 degrees  Taken 12/26/2022 2200 by Emelynn Rance, RN  Activity Management: up ad lib  Head of Bed [facility]) Positioning: HOB at 30 degrees  Taken 12/26/2022 2015 by Jozlyn Schatz, RN  Pressure Reduction Techniques: frequent weight shift encouraged  Pressure Reduction Devices: pressure-redistributing mattress utilized  Skin Protection:   adhesive use limited   tubing/devices free from skin contact  Taken 12/26/2022 2000 by Greggory Stallion, RN  Activity Management: up ad lib  Pressure Reduction Techniques: frequent weight shift encouraged  Head of Bed (HOB) Positioning: HOB at 30-45 degrees  Pressure Reduction Devices: pressure-redistributing mattress utilized  Skin Protection: adhesive use limited

## 2022-12-27 NOTE — Unmapped (Signed)
Pulmonary Consult Service  Consult Follow Up Note      Primary Service:   Medicine  Primary Service Attending:  Ebony Hail, MD  Reason for Consult:   Assistance with diuresis    ASSESSMENT and PLAN     Karen Barber is a 57 y.o. 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 Pt was found to have acute on chronic respiratory failure (sat in 70s on home 6L) in the setting of not taking her home medications (sildenafil, lasix, nebs, inhalers) in preparation for PET scan, admitted 9/11. She was admitted to the ICU initially, has since been weaned back to 6-8L [place] with airway clearance and diuresis. Her acute on chronic hypoxemia and increase O2 requirement was likely multifactorial -- combination of volume overload as well as lack of airway clearance at home, however I think chronically her O2 requirement (related to her PH) is higher than the amount she has been on at home and she continues to have severe exertional hypoxemia probably due to her parenchymal disease, at least in part.      Working on weaning her back to her home oxygen requirements. Unclear if iloprost has helped with symptoms and/or oxygenation.  Continuing to have challenges managing volume status and renal function. She unfortunately has a tenuous balance and now has worsening renal function. She is unfortunately not a transplant candidate due to PA plasty per chart review/anatomic concerns related to this.     RV Failure - PAH (Group 3, 5)  RHC 9/20 demonstrated severe pulmonary hypertension, markedly elevated PVR (9.3-10.5) and low CO/CI  HRCT completed for PH-ILD diagnosis and Tyvaso enrollment sent to [facility]. She is planned to continue Tyvaso at discharge. Her oxygen requirements are still 8-10L.   Tyvaso has been delivered to the hospital and pharmacy said she can start taking it early before the teaching with the help of RRT and it will not affect her insurance approval. Given limited remaining supply of iloprost, I agree with making this transition now. This involves stopping iloprost & starting tyvaso neb as a patient own med at 3 breaths qid.   I agree with nephrology that AKI at this time is mostly due to subtle volume overload with poor cardiac output & decreased renal blood flow. Would advise resuming diuresis as below. Low threshold for inotropic support if needed -- although overall her exam & her functional improvement since starting iloprost are reassuring.      Plan:  - Diuresis per primary team and nephrology -  1x lasix 80 mg IV given today.  - Monitor renal function closely. Nephrology following  - Start tyvaso 3 breaths qid. Please do not mix with other nebulizer solutions. Appreciate pharmacy assistance  - Continue sildenafil 40 mg TID  - Discontinue iloprost  - Continue daily weight with strict I/O  - cont daily pro-BNP    Acute on Chronic Hypoxemic Respiratory Failure  COPD/Asthma  Pulmonary Sarcoidosis c/b Bronchiectasis  Soluble IL-2 receptor, Aspergillus Galactomannan Antigen, IgE checked 10/2022 all wnl. PET scan 9/17 does not appear to be active pulmonary sarcoidosis and again it is overall felt to be burned out from sarcoidosis standpoint. She is now stable satting at >95% on 8-10L via [facility]. It is likely she has higher O2 requirement chronically than the amount  she has been using at home. This may reflect her new baseline and this was discussed with the patient. Discussion had with the patient about more realistic oxygen sats for her and limits on activity at home to prevent desats.     Plan:  - Set O2 sat goal at 90-94%, wean oxygen as tolerated  - continue home pulmicort, brovana, PRN albuterol, azithromycin  - please cont airway clearance:   - duonebs TID and HTS (3%) BID with aerobika    Chronic Pulmonary Aspergillosis   Aspergillus Galactomannan Antigen, IgE checked 10/2022 all wnl.   - continue home posaconazole   - pt has outpt evaluation for [facility] scheduled for 10/25 for history of recurrent hemoptysis    Karen Barber was seen, examined and discussed with  Dr. Lurena Nida  Thank you for involving [place] in her care. We look forward to following with you.  Recommendations were communicated to the primary team at 10:14 AM  Please page [place] with any questions or concerns at [phone] (pulmonary consult fellow).    [employer]    I attest that I have reviewed the student note and that the components of the history of the present illness, the physical exam, and the assessment and plan documented were performed by me or were performed in my presence by the student where I verified the documentation and performed (or re-performed) the exam and medical decision making.    Karen Jersey E. Talmadge Coventry, MD  Pulmonary & Critical Care Fellow  Pager: [phone]      HPI:     Interval history: Nausea improved.    History of Present Illness:  Karen Barber is a 57 y.o. female with hx pf pulmonary sarcoidosis, COPD, PAH (baseline home O2 6L), chronic pulmonary aspergillosis, s/p LUL lobectomy 2016 who initially presented to the hospital for a PET scan to evaluate her sarcoidosis.     Pt was found to have acute on chronic respiratory failure (sat in 70s on home 6L) in the setting of not taking her home medications (sildenafil, lasix, nebs, inhalers) in preparation for PET scan. As her respiratory status did not improve with NRB, placed on HFNC. 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 She received one dose of 125mg  of IV methylprednisolone and then was transferred to the MICU for further management of her hypoxia. Pt had a week long stay in the MICU and was weaned from HFNC to [facility] after IV diuresis and course of antibiotics (completed course of levaquin) for possible pneumonia. Pt has been continuing home airway clearance (Aerobika and HTS neb), and home pulmicort, brovana, atrovent, and PRN albuterol. Showing significant signs of improvement but still having desaturations with ambulation.     Pt started iloprost 9/19 and has tolerated it well so far. RHC 9/20 demonstrated severe pulmonary hypertension, markedly elevated PVR (9.3-10.5) and low cardiac output/index. HRCT chest showed unchanged fibrotic pulmonary sarcoidosis with bilateral segmental and subsegmental fibrosis post left upper lobectomy with unchanged apical right upper lobe and superior segment left lower lobe bullae.     Of note, has been hospitalized twice prior to this in the past 6 weeks.  8/18-8/25: presented with increased O2 requirement, increased swelling, brown sputum production.   - treated for serratia pneumonia (completed levaquin)  - Was discharged on AVAPS for home, as well as with HTS/albuterol and [place].  10/21/22-10/25/22: presented with DOE, up to 8-10L with exertion and desats to 70%.   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 A.niger from sputum 08/2016-09/2016.    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    Sarcoidosis 1995    Type 2 diabetes mellitus with hyperglycemia (CMS-HCC) 07/27/2014    Visual impairment glasses     Past Surgical History:   Procedure Laterality Date    LUNG LOBECTOMY      PR AMPUTATION TOE,MT-P JT Right 12/12/2019    Procedure: Right foot fifth toe amputation at the metatarsophalangeal joint level;  Surgeon: Britt Bottom, DPM;  Location: MAIN OR [facility];  Service: Vascular    PR AMPUTATION TOE,MT-P JT Right 06/16/2020    Procedure: Right foot amputation of 3rd toe at mpj level;  Surgeon: Britt Bottom, DPM;  Location: MAIN OR [facility];  Service: Vascular    PR RIGHT HEART CATH O2 SATURATION & CARDIAC OUTPUT N/A 04/29/2019    Procedure: Right Heart Catheterization;  Surgeon: Rosana Hoes, MD;  Location: [facility] CATH;  Service: Cardiology    PR RIGHT HEART CATH O2 SATURATION & CARDIAC OUTPUT N/A 12/08/2022    Procedure: Right Heart Catheterization;  Surgeon: Neal Dy, MD;  Location: [facility] CATH;  Service: Cardiology    PR THORACOSCOPY SURG TOT PULM DECORT Left 12/14/2014    Procedure: THORACOSCOPY SURG; W/TOT PULM DECORTIC/PNEUMOLYS;  Surgeon: Evert Kohl, MD;  Location: MAIN OR [facility];  Service: Cardiothoracic    PR THORACOSCOPY W/THERA WEDGE RESEXN INITIAL UNILAT Left 12/03/2014    Procedure: THORACOSCOPY, SURGICAL; WITH THERAPEUTIC WEDGE RESECTION (EG, MASS, NODULE) INITIAL UNILATERAL;  Surgeon: Evert Kohl, MD;  Location: MAIN OR [facility];  Service: Cardiothoracic       Other History:  Family History   Problem Relation Age of Onset    Dementia Mother     Diabetes Father     Diabetes Brother     Diabetes Maternal Aunt     Sarcoidosis Maternal Aunt     Diabetes Maternal Uncle     Diabetes Paternal Aunt     Diabetes Paternal Uncle      Social History     Socioeconomic History    Marital status: Single   Tobacco Use    Smoking status: Former     Current packs/day: 0.00     Average packs/day: 0.5 packs/day for 9.0 years (4.5 ttl pk-yrs)     Types: Cigarettes     Start date: 05/02/1978     Quit date: 05/03/1987     Years since quitting: 35.6    Smokeless tobacco: Never Vaping Use    Vaping status: Never Used   Substance and Sexual Activity    Alcohol use: No     Alcohol/week: 0.0 standard drinks  of alcohol    Drug use: No    Sexual activity: Yes   Other Topics Concern    Do you use sunscreen? No    Tanning bed use? No    Are you easily burned? No    Excessive sun exposure? No    Blistering sunburns? No     Social Determinants of Health     Financial Resource Strain: Low Risk  (11/06/2022)    Overall Financial Resource Strain (CARDIA)     Difficulty of Paying Living Expenses: Not hard at all   Food Insecurity: No Food Insecurity (11/06/2022)    Hunger Vital Sign     Worried About Running Out of Food in the Last Year: Never true     Ran Out of Food in the Last Year: Never true   Transportation Needs: No Transportation Needs (11/06/2022)    PRAPARE - Therapist, art (Medical): No     Lack of Transportation (Non-Medical): No       Home Medications:  No current facility-administered medications on file prior to encounter.     Current Outpatient Medications on File Prior to Encounter   Medication Sig Dispense Refill    ACCU-CHEK GUIDE GLUCOSE METER Misc 1 Device by Other route Four (4) times a day (before meals and nightly). AS DIRECTED 1 kit 0    albuterol 2.5 mg /3 mL (0.083 %) nebulizer solution Inhale 3 mL (2.5 mg total) by nebulization every six (6) hours as needed for wheezing or shortness of breath (airway clearance/cough). 360 mL 3    albuterol HFA 90 mcg/actuation inhaler Inhale 2 puffs every four (4) hours as needed for wheezing or shortness of breath. 54 g 0    arformoterol (BROVANA) 15 mcg/2 mL Inhale 2 mL (15 mcg total) by nebulization in the morning and 2 mL (15 mcg total) in the evening. 120 mL 2    aspirin-caffeine (BAYER BACK AND BODY) 500-32.5 mg Tab Take by mouth. Takes 2 1/2 pills 3-4 times a day as needed.      azithromycin (ZITHROMAX) 250 MG tablet Take 1 tablet (250 mg total) by mouth daily. Start on 9/1 after completing the levofloxacin 90 tablet 0    blood sugar diagnostic Strp by Other route Three (3) times a day. ACCU-CHEK Guide meter 300 strip 1    budesonide (PULMICORT) 0.5 mg/2 mL nebulizer solution Inhale 2 mL (0.5 mg total) by nebulization in the morning and 2 mL (0.5 mg total) in the evening. 120 mL 2    furosemide (LASIX) 80 MG tablet Take 1 tablet (80 mg total) by mouth two (2) times a day. Take a third dose as needed for increasing weight or increasing swelling. 60 tablet 0    gabapentin (NEURONTIN) 300 MG capsule Take 1 capsule (300 mg total) by mouth Three (3) times a day. 270 capsule 3    insulin aspart (NOVOLOG U-100 INSULIN ASPART) 100 unit/mL vial Inject 0.01-0.2 mL (1-20 Units total) under the skin Four (4) times a day (before meals and nightly). SLIDING SCALE 20 mL 1    insulin glargine (BASAGLAR KWIKPEN U-100 INSULIN) 100 unit/mL (3 mL) injection pen Inject 0.18 mL (18 Units total) under the skin nightly. 15 mL 0    ipratropium (ATROVENT) 0.02 % nebulizer solution Inhale 2.5 mL (500 mcg total) by nebulization four (4) times a day. 300 mL 0    posaconazole (NOXAFIL) 100 mg delayed released tablet Take 2 tablets by mouth once daily  60 tablet 0    sildenafiL, pulm.hypertension, (REVATIO) 20 mg tablet Take 1 tablet (20 mg total) by mouth Three (3) times a day. 90 tablet 0    OXYGEN-AIR DELIVERY SYSTEMS MISC Dose: 2-3 LPM         In-Hospital Medications:  Scheduled:   acetaminophen  650 mg Oral Q6H    arformoterol  15 mcg Nebulization BID (RT)    azithromycin  250 mg Oral Daily    budesonide  0.5 mg Nebulization BID (RT)    diclofenac sodium  2 g Topical QID    enoxaparin (LOVENOX) injection  40 mg Subcutaneous Nightly    flu vacc [id number] 6mos up(PF)  0.5 mL Intramuscular During hospitalization    [Provider Hold] furosemide  80 mg Oral BID    gabapentin  100 mg Oral TID    iloprost  5 mcg Nebulization 6XD    insulin glargine  12 Units Subcutaneous Nightly    insulin lispro  0-20 Units Subcutaneous ACHS    insulin lispro  3 Units Subcutaneous 3xd Meals    ipratropium-albuterol  3 mL Nebulization TID (RT)    lidocaine  1 patch Transdermal Daily    magnesium oxide-Mg AA chelate  2 tablet Oral BID    posaconazole  200 mg Oral Daily    sildenafiL (pulm.hypertension)  40 mg Oral TID    sodium chloride  4 mL Nebulization BID (RT)         PRN Medications:  albuterol, calcium carbonate, dextrose in water, glucagon, melatonin, [EXPIRED] Implement **AND** [EXPIRED] Care order/instruction **AND** [EXPIRED] Vital signs **AND** Adult Oxygen therapy **AND** [EXPIRED] sodium chloride **AND** [EXPIRED] sodium chloride 0.9% **AND** [EXPIRED] diphenhydrAMINE **AND** [EXPIRED] famotidine **AND** methylPREDNISolone sodium succinate **AND** [EXPIRED] EPINEPHrine, ondansetron **OR** ondansetron, polyethylene glycol, prochlorperazine, senna, sodium chloride    Allergies:  Allergies as of 11/29/2022    (No Known Allergies)         PHYSICAL EXAM:   BP 91/66  - Pulse 74  - Temp 36.3 ??C (97.4 ??F) (Oral)  - Resp 26  - Ht 180.3 cm (5' 11)  - Wt 86.6 kg (190 lb 14.7 oz)  - LMP  (LMP Unknown)  - SpO2 91%  - BMI 26.63 kg/m??   General: Alert, well-appearing, and in no distress.  Eyes: Anicteric sclera, conjunctiva clear.  ENT:   no drainage.  Cardiovascular: Regular rate and rhythm.  Pulmonary: Diminished lung sounds in bilateral upper lobes, crackles bilateral bases  Musculoskeletal: No clubbing. trace LE edema  Skin: No rashes or lesions.,    Neuro: No focal neurological deficits.    LABORATORY and RADIOLOGY DATA:     Pertinent Laboratory Data:  Lab Results   Component Value Date    WBC 3.1 (L) 12/27/2022    HGB 10.9 (L) 12/27/2022    HCT 33.7 (L) 12/27/2022    PLT 137 (L) 12/27/2022       Lab Results   Component Value Date    NA 136 12/27/2022    K 4.9 (H) 12/27/2022    CL 97 (L) 12/27/2022    CO2 36.0 (H) 12/27/2022    BUN 50 (H) 12/27/2022    CREATININE 2.95 (H) 12/27/2022    GLU 124 12/27/2022    CALCIUM 9.6 12/27/2022    MG 2.5 12/27/2022    PHOS 5.3 (H) 12/25/2022 Lab Results   Component Value Date    BILITOT 0.7 12/25/2022    BILIDIR <0.10 05/30/2019    PROT 7.3 12/25/2022  ALBUMIN 3.6 12/25/2022    ALT 20 12/25/2022    AST 27 12/25/2022    ALKPHOS 91 12/25/2022    GGT 60 (H) 05/31/2011       Lab Results   Component Value Date    INR 1.02 12/11/2019    APTT 30.9 12/11/2019       Pertinent Micro Data:  Microbiology Results (last day)       ** No results found for the last 24 hours. **             Pertinent Imaging Data:  9/11 CXR   - Extensive fibrotic changes and biapical bulla appear grossly unchanged. Extensive pulmonary fibrosis limits evaluation for superimposed infection.     9/17 Echo    Summary    1. Limited study.    2. The left ventricle is normal in size with normal wall thickness.    3. The left ventricular systolic function is normal, LVEF is visually  estimated at > 55%.    4. The right ventricle is severely dilated in size, with mildly reduced  systolic function.    5. There is severe pulmonary hypertension.    6. TR maximum velocity: 4.4 m/s  Estimated PASP: 86 mmHg.    7. IVC size and inspiratory change suggest mildly elevated right atrial  pressure. (5-10 mmHg).    8. No significant change since prior study from 10/23/2022    9/18 PET CT  Impression   Bulky, partially calcified lymph nodes in the mediastinum and bilateral hilar with no significant to mild uptake, decreased in intensity since prior.   Grossly unchanged metabolically active right supraclavicular lymph nodes, accounting for differences in positioning of the arms and lack of IV contrast.   Bronchiectasis and partially metabolically active reticular opacities are seen bilaterally, most prominent in the lower lobes, overall similar to prior.   Dilated main pulmonary artery measuring up to 4.0 cm, suggestive of pulmonary arterial hypertension.     9/20 RHC  FINAL CARDIAC CATHETERIZATION REPORT    CONCLUSIONS:  - Severe pulmonary Hypertension  - Markedly elevated PVR (9.3 - 10.5)  - Low cardiac output / index     9/25 CXR  No significant interval change since the prior chest radiograph.     9/25 CT Chest High Resolution w/o contrast  Unchanged fibrotic pulmonary sarcoidosis with bilateral segmental and subsegmental fibrosis post left upper lobectomy with unchanged apical right upper lobe and superior segment left lower lobe bullae.     9/27 CXR  Fibrotic interstitial lung disease with biapical large bullae which are better characterized on the prior CT chest. No significant interval changes.     9/28 CXR  Fibrotic interstitial lung disease with biapical large bullae which are better characterized on the prior CT chest. No significant interval changes.    10/4 CXR  Unchanged combined pulmonary fibrosis and emphysema with biapical bullae.     10/7 CXR  Little interval change from prior.     10/8 Renal US  FINDINGS:      KIDNEYS: Normal size and echogenicity. No solid masses or calculi. No hydronephrosis.        Right kidney: 9.7 cm        Left kidney: 9.7 cm      BLADDER: Small volume of echogenic debris within the bladder, nonspecific.        Bladder volume prevoid: 125.4 mL

## 2022-12-27 NOTE — Unmapped (Signed)
Assessment: Karen Barber is a 57 y.o. female who was admitted for Acute on chronic hypoxic respiratory failure (CMS-HCC)       Shift Events: Karen Barber remains on MPCU. A&Ox4. On 7-10L LBNC; intermittently using the NRB. Lung sounds diminished with crackles; DOE. HR- 60s-70s. SBP-90s-100s. Afebrile. Voiding per purewick; UOP diminished. PRN TUMS given x1; PRN zofran given x1.         Current Hospital Length of Stay: 28 Days    INFUSIONS:      VITALS:  Vitals:    12/21/22 1100 12/26/22 1846   Weight: 87.7 kg (193 lb 5.5 oz) 86.6 kg (190 lb 14.7 oz)      Weight change:      Temp:  [36 ??C (96.8 ??F)-36.9 ??C (98.4 ??F)] 36.3 ??C (97.4 ??F)  Heart Rate:  [69-75] 74  SpO2 Pulse:  [68-78] 74  Resp:  [17-31] 26  BP: (90-110)/(48-77) 91/66  MAP (mmHg):  [61-82] 72  FiO2 (%):  [60 %] 60 %  SpO2:  [88 %-100 %] 91 %     VENT/OXYGEN SETTINGS:  FiO2 (%): 60 %    INS & OUTS:  Date 12/26/22 1901 - 12/27/22 0700 12/27/22 0701 - 12/28/22 0700   Shift 1901-0700 24 Hour Total 0701-1900 1901-0700 24 Hour Total   INTAKE   P.O. 100 820 250  250   Shift Total 100 820 250  250   OUTPUT   Urine 200 700 150  150   Shift Total 200 700 150  150     Intake/Output Summary (Last 24 hours) at 12/27/2022 1658  Last data filed at 12/27/2022 1600  Gross per 24 hour   Intake 350 ml   Output 350 ml   Net 0 ml        ACCESS:  Patient Lines/Drains/Airways Status       Active Active Lines, Drains, & Airways       Name Placement date Placement time Site Days    External Urinary Device 12/02/22 With Suction 12/02/22  1900  -- 24    Peripheral IV 12/11/22 Left;Posterior Forearm 12/11/22  1400  Forearm  16                           See flowsheets/MAR for further information      Karen Gauss, RN  December 27, 2022 4:58 PM                                                    Problem: Adult Inpatient Plan of Care  Goal: Plan of Care Review  Outcome: Ongoing - Unchanged  Goal: Patient-Specific Goal (Individualized)  Outcome: Ongoing - Unchanged  Goal: Absence of Hospital-Acquired Illness or Injury  Outcome: Ongoing - Unchanged  Intervention: Identify and Manage Fall Risk  Recent Flowsheet Documentation  Taken 12/27/2022 0800 by Karen Gauss, RN  Safety Interventions:   aspiration precautions   bleeding precautions   fall reduction program maintained   infection management   lighting adjusted for tasks/safety   low bed   room near unit station   nonskid shoes/slippers when out of bed  Intervention: Prevent Skin Injury  Recent Flowsheet Documentation  Taken 12/27/2022 1600 by Karen Gauss, RN  Positioning for Skin: Sitting in Chair  Taken 12/27/2022 1400 by  Karen Gauss, RN  Positioning for Skin: Sitting in Chair  Taken 12/27/2022 1200 by Karen Gauss, RN  Positioning for Skin: Sitting in Chair  Taken 12/27/2022 1000 by Karen Gauss, RN  Positioning for Skin: Sitting in Chair  Taken 12/27/2022 0800 by Karen Gauss, RN  Positioning for Skin: Right  Device Skin Pressure Protection:   adhesive use limited   absorbent pad utilized/changed   pressure points protected   positioning supports utilized   skin-to-device areas padded   skin-to-skin areas padded   tubing/devices free from skin contact  Skin Protection:   adhesive use limited   skin-to-skin areas padded   skin-to-device areas padded   tubing/devices free from skin contact   incontinence pads utilized  Intervention: Prevent Infection  Recent Flowsheet Documentation  Taken 12/27/2022 0800 by Karen Gauss, RN  Infection Prevention:   cohorting utilized   equipment surfaces disinfected   hand hygiene promoted   personal protective equipment utilized   rest/sleep promoted   visitors restricted/screened   single patient room provided   environmental surveillance performed  Goal: Optimal Comfort and Wellbeing  Outcome: Ongoing - Unchanged  Goal: Readiness for Transition of Care  Outcome: Ongoing - Unchanged  Goal: Rounds/Family Conference  Outcome: Ongoing - Unchanged     Problem: Fall Injury Risk  Goal: Absence of Fall and Fall-Related Injury  Outcome: Ongoing - Unchanged  Intervention: Promote Scientist, clinical (histocompatibility and immunogenetics) Documentation  Taken 12/27/2022 0800 by Karen Gauss, RN  Safety Interventions:   aspiration precautions   bleeding precautions   fall reduction program maintained   infection management   lighting adjusted for tasks/safety   low bed   room near unit station   nonskid shoes/slippers when out of bed     Problem: Comorbidity Management  Goal: Blood Glucose Levels Within Targeted Range  Outcome: Progressing     Problem: Comorbidity Management  Goal: Blood Glucose Levels Within Targeted Range  Outcome: Progressing  Goal: Blood Pressure in Desired Range  Outcome: Ongoing - Unchanged     Problem: Skin Injury Risk Increased  Goal: Skin Health and Integrity  Outcome: Ongoing - Unchanged  Intervention: Optimize Skin Protection  Recent Flowsheet Documentation  Taken 12/27/2022 0800 by Karen Gauss, RN  Pressure Reduction Techniques:   frequent weight shift encouraged   heels elevated off bed  Head of Bed (HOB) Positioning: HOB elevated  Pressure Reduction Devices: positioning supports utilized  Skin Protection:   adhesive use limited   skin-to-skin areas padded   skin-to-device areas padded   tubing/devices free from skin contact   incontinence pads utilized

## 2022-12-27 NOTE — Unmapped (Signed)
Palliative Care Progress Note    Consultation from Requesting Attending Physician:  Ebony Hail, MD  Primary Care Provider:  Loran Senters, FNP    Code Status:   Code Status: DNR and DNI   Healthcare decision-maker if lacks capacity:   HCDM (patient stated preference) (Active): Muchow,Bryant - Brother [phone]    HCDM, back-up (If primary HCDM is unavailable): [name] - Domestic Partner [phone]     Assessment/Plan:      SUMMARY:  This 57 y.o. 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 Intially admitted to the MICU for respiratory failure requiring HiFlo nasal canula. Now with slowly improving respiratory failure. Hospital course also complicated by AKI on CKD intermittently limiting diuresis.     Symptom Assessment and Recommendations:      # Nausea without vomiting: Nausea seems to correlate around the time of her Iloprost nebulizer treatments and has been relieved with Zofran.   - CONTINUE Zofran 4 mg q8hr PRN nausea ODT or IV if unable to tolerate po   - Continue Compazine 5 mg PO q6hr PRN nausea (2nd line if unrelieved by Zofran) -> if nausea is more secondary to volume overload Compazine may be more helpful in symptom management      # Pain in neck and upper back and Paraesthesia in lower extremities: Stable. Neck and back pain mainly positional in nature and secondary to posture/hospital bed. Lower extremity paraesthesia and pain previously 2/2 diabetic neuropathy but more recently from volume overload/LE swelling, now improving with diuresis.   - CONTINUE Gabapentin 100 mg TID -> current dose working well for patient (was previously on 300 mg TID at home)  - CONTINUE scheduled Lidocaine patches (can add additional patch if needed)  - Continue scheduled Tylenol 650 mg every 6 hours  - Continue working with PT/OT      # Dyspnea: 2/2 to underlying pulmonary sarcoidosis and PAH. Dyspnea mainly with exertion or lying flat. Her goal is to get home with supplemental oxygen.   - Diuresis and PAH management per primary team   - For now would hold on opiates for air hunger/dyspnea however this is something that can be considered at low dose if exertional dyspnea becomes more of a burden for her/barrier to discharge home      # Insomnia: Minimal sleep since being admitted due to necessary interventions waking her at night.   - Try to minimize night time awakening as much as possible  - Consider scheduling Melatonin (currently PRN)      # Bowel Regimen: Patient endorses regular bowel movements without the scheduled Miralax.   - Continue Miralax PRN daily for constipation   - She is certainly at high risk for constipation so may have to reschedule if this becomes an issue        Goals of care / ACP:    Decisional capacity at time of visit:  yes    Current goals of care:  DNR/DNI  Jackie's main goal is to be discharged home when medically stable to continue her recovery from this hospitalization. She feels she will be able to care for her self at home and her brother lives close by and can help her as needed.  She says she is  not ready to give up and would want escalation of care if needed during this hospitalization however would not want life support including intubation/mechanical ventilation, CPR, or feeding tubes. She is worried about her kidney function and is interested in talking with nephrology today. She notes she has heard about dialysis in the past and she is unsure if this is something she would want if it gets to that point.     We gave patient [facility] Directive form today for her to review and fill out         Practical, Emotional, Spiritual Support / Other Communication and Counseling:      [name] says that she is more depressed today given the news that her kidney function was worsened today. She says she is very nervous to talk with nephrology today. She did speak with the chaplain yesterday which was helpful for her although she worries that when the chaplain comes by that this means the team is really worried about her. She says she misses her dogs and would love for them to be able to be here with her.     - Continue chaplain support -> I let her know that [place] recommending the chaplain come by does not mean that we are more worried about her and her disease process but they are an additional aspect of support for her  - If able she would love to have the therapy dog come see her, I discussed this with her nurse to see if it was possible in the MPCU             Thank you for this consult. Please page  Linna Hoff or Liana Crocker  or Palliative Care [phone]) if there are any questions.     Subjective:     Recent Events:      No acute events overnight. Patient up in the chair after working with PT this morning when I saw her. She says she is down today after hearing from her primary team that her kidneys were getting worse. She says she came into the hospital with one problem but now has multiple additional problems including issues with her kidneys, blood pressure, and swelling that are bothersome to her.     Her nausea is overall controlled. She feels that this comes on when she is getting the Iloprost nebulizer. No vomiting. Pain is currently controlled. No having any neuropathy in the feet today.           No updates to past / social / family history    Scheduled Meds:   acetaminophen  650 mg Oral Q6H    arformoterol  15 mcg Nebulization BID (RT)    azithromycin  250 mg Oral Daily    budesonide  0.5 mg Nebulization BID (RT)    diclofenac sodium  2 g Topical QID    enoxaparin (LOVENOX) injection  40 mg Subcutaneous Nightly    flu vacc [id number] 6mos up(PF)  0.5 mL Intramuscular During hospitalization    [Provider Hold] furosemide  80 mg Oral BID    gabapentin  100 mg Oral TID    iloprost  5 mcg Nebulization 6XD    insulin glargine  12 Units Subcutaneous Nightly    insulin lispro  0-20 Units Subcutaneous ACHS    insulin lispro  3 Units Subcutaneous 3xd Meals    ipratropium-albuterol  3 mL Nebulization TID (RT)    lidocaine  1  patch Transdermal Daily    magnesium oxide-Mg AA chelate  2 tablet Oral BID    posaconazole  200 mg Oral Daily    sildenafiL (pulm.hypertension)  40 mg Oral TID    sodium chloride  4 mL Nebulization BID (RT)     Continuous Infusions:  PRN Meds:.albuterol, calcium carbonate, dextrose in water, glucagon, melatonin, [EXPIRED] Implement **AND** [EXPIRED] Care order/instruction **AND** [EXPIRED] Vital signs **AND** Adult Oxygen therapy **AND** [EXPIRED] sodium chloride **AND** [EXPIRED] sodium chloride 0.9% **AND** [EXPIRED] diphenhydrAMINE **AND** [EXPIRED] famotidine **AND** methylPREDNISolone sodium succinate **AND** [EXPIRED] EPINEPHrine, ondansetron **OR** ondansetron, polyethylene glycol, prochlorperazine, senna, sodium chloride     Objective:       Function:  60% - Ambulation: Reduced / unable to do hobby or some housework, significant disease / Self-Care:Occasional assist as necessary / Intake:Normal or reduced / Level of Conscious: Full or confusion    Temp:  [36 ??C (96.8 ??F)-36.9 ??C (98.4 ??F)] 36.4 ??C (97.5 ??F)  Heart Rate:  [69-82] 74  SpO2 Pulse:  [70-86] 76  Resp:  [17-30] 17  BP: (90-110)/(48-72) 90/72  FiO2 (%):  [60 %] 60 %  SpO2:  [88 %-100 %] 88 %    Physical Exam:  Constitutional: sitting up in chair NAD    Eyes: anicteric sclera, no discharge  ENMT: moist oral mucosa  Pulm: fine crackles noted throughout, normal work up breathing on 8L large bore [place] with conversation    CV: RRR, well perfused LE   Skin: dry and intact   Neuro: cognitive status normal    Psych: mood and affect - down and depressed but hopeful Test Results:  Lab Results   Component Value Date    WBC 3.1 (L) 12/27/2022    RBC 4.00 12/27/2022    HGB 10.9 (L) 12/27/2022    HCT 33.7 (L) 12/27/2022    MCV 84.2 12/27/2022    MCH 27.3 12/27/2022    MCHC 32.4 12/27/2022    RDW 17.2 (H) 12/27/2022    PLT 137 (L) 12/27/2022    MPV 8.9 12/27/2022     Lab Results   Component Value Date    NA 136 12/27/2022    K 4.9 (H) 12/27/2022    CL 97 (L) 12/27/2022    CO2 36.0 (H) 12/27/2022    BUN 50 (H) 12/27/2022    CREATININE 2.95 (H) 12/27/2022    GFR >= 60 05/31/2011    GLU 124 12/27/2022    CALCIUM 9.6 12/27/2022    ALBUMIN 3.6 12/25/2022    PHOS 5.3 (H) 12/25/2022      Lab Results   Component Value Date    ALKPHOS 91 12/25/2022    BILITOT 0.7 12/25/2022    BILIDIR <0.10 05/30/2019    PROT 7.3 12/25/2022    ALBUMIN 3.6 12/25/2022    ALT 20 12/25/2022    AST 27 12/25/2022         I personally spent 35 minutes on the floor or unit in direct patient care. The direct patient care time included face-to-face time with the patient, reviewing the patient's chart, communicating with the family and/or other professionals and coordinating care.   Start time - stop time if >60 minutes:    Greater than 50% of this time spent on counseling/coordination of care:  Yes.   See ACP Note from today for additional billable service:  No.      Carolanne Grumbling, MD, [facility]  [facility] Internal Medicine PGY-3  Pager: [phone]

## 2022-12-27 NOTE — Unmapped (Signed)
Patient currently on 10LPM LBNC due to desaturation (SpO2 of 80-82).

## 2022-12-27 NOTE — Unmapped (Addendum)
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  On 10/7 began rising after diuresis of 2.7 L of urine on 10/6, and creatinine up to 2.95 at the time of consult.  DDx of prerenal from relative volume depletion, cardiorenal given narrow threshold for forward flow, congestion though suspect this is not the case in the setting of good urine output and rising CO2 despite BNP's rising over the last few days.  Volume status exam is extremely challenging in the setting of her pulmonary hypertension though I am led to believe that she is close to euvolemic and may be even a little bit volume down.  Did have an acute blood pressure drop though unclear if this was completely accurate on early morning on 10/6 superhigh raises possibility of ATN from that ischemic injury. No hydronephrosis on renal ultrasound though agree with continuing to monitor postvoid residuals  Reviewed sediment today and bland with squamous cells only. Discussed with primary team and even though clinically appears close to euvolemic (and maybe a little volume down), given narrow window of compensation, rising BNP and trend to ?more O2 (though satting extremely well), can trial higher dose diuretics today since the risk of getting behind on diuresis is very, very high. Would aim for gently net negative at most if pursuing this. If robust UOP and worse renal function, at that time, would be more consistent with ATN vs prerenal physiology vs worsening PAH.   -May consider repeat right heart cath to get a sense of PA pressures and filling pressures given hypoxemia increasing?  So appreciate pulmonary recommendations  For now which would manage as ATN with maintenance of MAP greater than 65    # Pulmonary sarcoidosis complicated by pulmonary hypertension on chronic oxygen  - Evaluation and management per primary team  - No changes to management from a nephrology standpoint at this time    RECOMMENDATIONS:   Reviewed sediment today and bland with squamous cells only. Discussed with primary team and even though clinically appears close to euvolemic (and maybe a little volume down), given narrow window of compensation, rising BNP and trend to ?more O2 (though satting extremely well), can trial higher dose diuretics today since the risk of getting behind on diuresis is very, very high. Would aim for gently net negative at most if pursuing this. If robust UOP and worse renal function, at that time, would be more consistent with ATN vs prerenal physiology vs worsening PAH.   -May consider repeat right heart cath to get a sense of PA pressures and filling pressures given hypoxemia increasing?  So appreciate pulmonary recommendations  For now which would manage as ATN with maintenance of MAP greater than 65  - We will continue to follow.     [name], MD  12/27/2022 9:54 AM     Medical decision-making for 12/27/22  Findings / Data     Patient has: []  acute illness w/systemic sxs  [mod]  []  two or more stable chronic illnesses [mod]  []   one chronic illness with acute exacerbation [mod]  []  acute complicated illness  [mod]  []  Undiagnosed new problem with uncertain prognosis  [mod] [x]  illness posing risk to life or bodily function (ex. AKI)  [high]  []  chronic illness with severe exacerbation/progression  [high]  []  chronic illness with severe side effects of treatment  [high] AKI Probs At least 2:  Probs, Data, Risk   I reviewed: [x]  primary team note  [x]  consultant note(s)  []  external records [x]  chemistry results  []  CBC results  []  blood gas results  []  Other []  procedure/op note(s)   [x]  radiology report(s)  []  micro result(s)  []  w/ independent historian(s) Pulm notes reviewed, Cr 2.95; no hydronephrosis >=3 Data Review (2 of 3)    I independently interpreted: []  Urine Sediment  []  Renal US []  CXR Images  []  CT Images  []  Other []  EKG Tracing  Any     I discussed: []  Pathology results w/ QHPs(s) from other specialties  []  Procedural findings w/ QHPs(s) from other specialties []  Imaging w/ QHP(s) from other specialties  [x]  Treatment plan w/ QHP(s) from other specialties Plan discussed with primary team Any     Mgm't requires: []  Prescription drug(s)  [mod]  []  Kidney biopsy  [mod]  []  Central line placement  [mod] []  High risk medication use and/or intensive toxicity monitoring [high]  []  Renal replacement therapy [high]  []  High risk kidney biopsy  [high]  []  Escalation of care  [high]  []  High risk central line placement  [high] IV Diuresis: check BMP/Mg  Risk      ____________________________________________________    History of Present Illness: Karen Barber is 57 y.o. 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 Nephrology has been consulted for AKI.     Patient was initially admitted 9/11 when she presented for a PET scan to evaluate her sarcoidosis but unfortunately was found to be hypoxemic on her home 6 L of nasal cannula requiring high flow nasal cannula and was admitted initially to the ICU.  She was diuresed and her oxygenation improved and she was stepped down to the [facility].  She was treated with antibiotics as well as steroids for infection and bronchiectasis.  She underwent right heart cath on 9/20 with elevated mean PA pressures and was started on iloprost.  Notes that she has not been told she has kidney issues prior to this as she regularly follows up with a PCP.  She notes that she is not eating or drinking very well as she is worried about accumulating fluid whenever she eats salt and she does not like the food here.  Denies nausea.    Lasix dosing over the last few days has included 80 mg IV once or twice twice a day till 10/6, with diuresis holiday 10/7 and then given 60 mg IV Lasix on 10/8.    Patient does note that she is urinating less over the last few days and has to push a little bit to empty her bladder at the end.    Of note O2 requirement is gradually increasing from baseline of 6 L nasal cannula on 10/6 and 10/7 up to 9 and up to 10 L on day of  consult.  Urine output of 300 up to 700 today.  Curiously proBNP has been rising the last few days though team has felt she is relatively euvolemic during this time.    Creatinine recently has steady down in the 1.6-1.7 range however starting 10/7 rising up and up to 2.95 on day of consult    INPATIENT MEDICATIONS:    Current Facility-Administered Medications:     acetaminophen (TYLENOL) tablet 650 mg, Oral, Q6H    albuterol 2.5 mg /3 mL (0.083 %) nebulizer solution 2.5 mg, Nebulization, Q6H PRN    arformoterol (BROVANA) nebulizer solution 15 mcg/2 mL, Nebulization, BID (RT)    azithromycin (ZITHROMAX) tablet 250 mg, Oral, Daily    budesonide (PULMICORT) nebulizer solution 0.5 mg, Nebulization, BID (RT)    calcium carbonate (TUMS) chewable tablet 200 mg elem calcium, Oral, TID PRN    dextrose (D10W) 10% bolus 125 mL, Intravenous, Q10 Min PRN    diclofenac sodium (VOLTAREN) 1 % gel 2 g, Topical, QID    enoxaparin (LOVENOX) syringe 40 mg, Subcutaneous, Nightly    flu vaccine TS 2024-25(4mos up)(PF)(FLULAVAL, FLUARIX, FLUZONE), Intramuscular, During hospitalization    [Provider Hold] furosemide (LASIX) tablet 80 mg, Oral, BID    gabapentin (NEURONTIN) capsule 100 mg, Oral, TID    glucagon injection 1 mg, Intramuscular, Once PRN    iloprost (VENTAVIS) nebulizer solution, Nebulization, 6XD    insulin glargine (LANTUS) injection 12 Units, Subcutaneous, Nightly    insulin lispro (HumaLOG) injection 0-20 Units, Subcutaneous, ACHS    insulin lispro (HumaLOG) injection 3 Units, Subcutaneous, 3xd Meals    ipratropium-albuterol (DUO-NEB) 0.5-2.5 mg/3 mL nebulizer solution 3 mL, Nebulization, TID (RT)    lidocaine (ASPERCREME) 4 % 1 patch, Transdermal, Daily    magnesium oxide-Mg AA chelate (Magnesium Plus Protein) 2 tablet, Oral, BID    melatonin tablet 6 mg, Oral, Nightly PRN    [EXPIRED] Implement, , Until Discontinued **AND** [EXPIRED] Care order/instruction, , PRN **AND** [EXPIRED] Vital signs, , PRN **AND** Adult Oxygen therapy, , PRN **AND** [EXPIRED] sodium chloride (NS) 0.9 % infusion, Intravenous, Continuous PRN **AND** [EXPIRED] sodium chloride 0.9% (NS) bolus 1,000 mL, Intravenous, Daily PRN **AND** [EXPIRED] diphenhydrAMINE (BENADRYL) injection 25 mg, Intravenous, Q4H PRN **AND** [EXPIRED] famotidine (PEPCID) injection 20 mg, Intravenous, Q4H PRN **AND** methylPREDNISolone sodium succinate (PF) (SOLU-Medrol) injection 125 mg, Intravenous, Q4H PRN **AND** [EXPIRED] EPINEPHrine (EPIPEN) injection 0.3 mg, Intramuscular, Daily PRN    ondansetron (ZOFRAN-ODT) disintegrating tablet 4 mg, Oral, Q8H PRN **OR** ondansetron (ZOFRAN) injection 4 mg, Intravenous, Q8H PRN    polyethylene glycol (MIRALAX) packet 17 g, Oral, Daily PRN    posaconazole (NOXAFIL) delayed released tablet 200 mg, Oral, Daily    prochlorperazine (COMPAZINE) tablet 5 mg, Oral, Q6H PRN    senna (SENOKOT) tablet 2 tablet, Oral, Nightly PRN    sildenafiL (pulm.hypertension) (REVATIO) tablet 40 mg, Oral, TID    sodium chloride (OCEAN) 0.65 % nasal spray 1 spray, Each Nare, Q6H PRN    sodium chloride 3 % NEBULIZER solution 4 mL, Nebulization, BID (RT)    OUTPATIENT MEDICATIONS:  Prior to Admission medications    Medication Dose, Route, Frequency   ACCU-CHEK GUIDE GLUCOSE METER Misc 1 Device, Other, 4 times a day ([facility]), AS DIRECTED   albuterol 2.5 mg /3 mL (0.083 %) nebulizer solution 2.5 mg, Nebulization, Every 6 hours PRN   albuterol HFA 90 mcg/actuation inhaler 2 puffs, Inhalation, Every 4 hours PRN   arformoterol (BROVANA) 15 mcg/2 mL 15 mcg, Nebulization, 2 times daily (RT)  aspirin-caffeine (BAYER BACK AND BODY) 500-32.5 mg Tab Oral, Takes 2 1/2 pills 3-4 times a day as needed.   azithromycin (ZITHROMAX) 250 MG tablet 250 mg, Oral, Every 24 hours, Start on 9/1 after completing the levofloxacin   blood sugar diagnostic Strp Other, 3 times a day (standard), ACCU-CHEK Guide meter   budesonide (PULMICORT) 0.5 mg/2 mL nebulizer solution 0.5 mg, Nebulization, 2 times daily (RT)   furosemide (LASIX) 80 MG tablet 80 mg, Oral, 2 times a day (standard), Take a third dose as needed for increasing weight or increasing swelling.   gabapentin (NEURONTIN) 300 MG capsule 300 mg, Oral, 3 times a day (standard)   insulin aspart (NOVOLOG U-100 INSULIN ASPART) 100 unit/mL vial 1-20 Units, Subcutaneous, 4 times a day ([facility]), SLIDING SCALE   insulin glargine (BASAGLAR KWIKPEN U-100 INSULIN) 100 unit/mL (3 mL) injection pen 18 Units, Subcutaneous, Nightly   ipratropium (ATROVENT) 0.02 % nebulizer solution 500 mcg, Nebulization, 4 times a day   posaconazole (NOXAFIL) 100 mg delayed released tablet 200 mg, Oral, Daily (standard)   sildenafiL, pulm.hypertension, (REVATIO) 20 mg tablet 20 mg, Oral, 3 times a day (standard)   OXYGEN-AIR DELIVERY SYSTEMS MISC Dose: 2-3 LPM        ALLERGIES:  Patient has no known allergies.    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 A.niger from sputum 08/2016-09/2016.    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    Sarcoidosis 1995    Type 2 diabetes mellitus with hyperglycemia (CMS-HCC) 07/27/2014    Visual impairment     glasses     Past Surgical History:   Procedure Laterality Date    LUNG LOBECTOMY      PR AMPUTATION TOE,MT-P JT Right 12/12/2019    Procedure: Right foot fifth toe amputation at the metatarsophalangeal joint level;  Surgeon: Britt Bottom, DPM;  Location: MAIN OR [facility];  Service: Vascular    PR AMPUTATION TOE,MT-P JT Right 06/16/2020    Procedure: Right foot amputation of 3rd toe at mpj level;  Surgeon: Britt Bottom, DPM;  Location: MAIN OR [facility];  Service: Vascular    PR RIGHT HEART CATH O2 SATURATION & CARDIAC OUTPUT N/A 04/29/2019    Procedure: Right Heart Catheterization;  Surgeon: Rosana Hoes, MD;  Location: [facility] CATH;  Service: Cardiology    PR RIGHT HEART CATH O2 SATURATION & CARDIAC OUTPUT N/A 12/08/2022    Procedure: Right Heart Catheterization;  Surgeon: Neal Dy, MD;  Location: [facility] CATH;  Service: Cardiology    PR THORACOSCOPY SURG TOT PULM DECORT Left 12/14/2014    Procedure: THORACOSCOPY SURG; W/TOT PULM DECORTIC/PNEUMOLYS;  Surgeon: Evert Kohl, MD;  Location: MAIN OR [facility];  Service: Cardiothoracic    PR Augustine Radar Betsey Amen  WEDGE RESEXN INITIAL UNILAT Left 12/03/2014    Procedure: THORACOSCOPY, SURGICAL; WITH THERAPEUTIC WEDGE RESECTION (EG, MASS, NODULE) INITIAL UNILATERAL;  Surgeon: Evert Kohl, MD;  Location: MAIN OR [facility];  Service: Cardiothoracic     SOCIAL HISTORY  Social History     Social History Narrative    Not on file      reports that she quit smoking about 35 years ago. Her smoking use included cigarettes. She started smoking about 44 years ago. She has a 4.5 pack-year smoking history. She has never used smokeless tobacco. She reports that she does not drink alcohol and does not use drugs.   FAMILY HISTORY  Family History   Problem Relation Age of Onset    Dementia Mother     Diabetes Father     Diabetes Brother     Diabetes Maternal Aunt     Sarcoidosis Maternal Aunt     Diabetes Maternal Uncle     Diabetes Paternal Aunt     Diabetes Paternal Uncle         Physical Exam:   Vitals:    12/27/22 0409 12/27/22 0729 12/27/22 0800 12/27/22 0822   BP: 97/48  90/72    Pulse: 72 73 70 69   Resp: 28 29 24 20    Temp:  36.4 ??C (97.5 ??F)     TempSrc:  Oral     SpO2: 99% 100% 100% 100%   Weight:       Height:         No intake/output data recorded.    Intake/Output Summary (Last 24 hours) at 12/27/2022 0954  Last data filed at 12/27/2022 0444  Gross per 24 hour   Intake 820 ml   Output 500 ml   Net 320 ml     Constitutional: well-appearing, no acute distress sitting up in chair  Heart: RRR, no m/r/g  Lungs: Faint crackles in left lower lung field, otherwise clear bilaterally  Abd: soft, non-tender, non-distended  Ext: No significant edema

## 2022-12-28 LAB — BASIC METABOLIC PANEL
ANION GAP: 5 mmol/L (ref 5–14)
ANION GAP: 7 mmol/L (ref 5–14)
BLOOD UREA NITROGEN: 50 mg/dL — ABNORMAL HIGH (ref 9–23)
BLOOD UREA NITROGEN: 41 mg/dL — ABNORMAL HIGH (ref 9–23)
BUN / CREAT RATIO: 17
BUN / CREAT RATIO: 16
CALCIUM: 9.7 mg/dL (ref 8.7–10.4)
CALCIUM: 9.9 mg/dL (ref 8.7–10.4)
CHLORIDE: 96 mmol/L — ABNORMAL LOW (ref 98–107)
CHLORIDE: 98 mmol/L (ref 98–107)
CO2: 37 mmol/L — ABNORMAL HIGH (ref 20.0–31.0)
CO2: 31 mmol/L (ref 20.0–31.0)
CREATININE: 2.93 mg/dL — ABNORMAL HIGH
CREATININE: 2.62 mg/dL — ABNORMAL HIGH
EGFR CKD-EPI (2021) FEMALE: 18 mL/min/{1.73_m2} — ABNORMAL LOW (ref >=60)
EGFR CKD-EPI (2021) FEMALE: 20 mL/min/{1.73_m2} — ABNORMAL LOW (ref >=60)
GLUCOSE RANDOM: 81 mg/dL (ref 70–179)
GLUCOSE RANDOM: 143 mg/dL (ref 70–179)
POTASSIUM: 4.8 mmol/L (ref 3.4–4.8)
POTASSIUM: 5 mmol/L — ABNORMAL HIGH (ref 3.4–4.8)
SODIUM: 138 mmol/L (ref 135–145)
SODIUM: 136 mmol/L (ref 135–145)

## 2022-12-28 LAB — CBC
HEMATOCRIT: 33.7 % — ABNORMAL LOW (ref 34.0–44.0)
HEMOGLOBIN: 11 g/dL — ABNORMAL LOW (ref 11.3–14.9)
MEAN CORPUSCULAR HEMOGLOBIN CONC: 32.8 g/dL (ref 32.0–36.0)
MEAN CORPUSCULAR HEMOGLOBIN: 27.5 pg (ref 25.9–32.4)
MEAN CORPUSCULAR VOLUME: 83.8 fL (ref 77.6–95.7)
MEAN PLATELET VOLUME: 9 fL (ref 6.8–10.7)
PLATELET COUNT: 145 10*9/L — ABNORMAL LOW (ref 150–450)
RED BLOOD CELL COUNT: 4.02 10*12/L (ref 3.95–5.13)
RED CELL DISTRIBUTION WIDTH: 17.1 % — ABNORMAL HIGH (ref 12.2–15.2)
WBC ADJUSTED: 2.8 10*9/L — ABNORMAL LOW (ref 3.6–11.2)

## 2022-12-28 LAB — MAGNESIUM
MAGNESIUM: 2.6 mg/dL (ref 1.6–2.6)
MAGNESIUM: 2.6 mg/dL (ref 1.6–2.6)

## 2022-12-28 LAB — PRO-BNP: PRO-BNP: 7209 pg/mL — ABNORMAL HIGH (ref <=300.0)

## 2022-12-28 LAB — LACTATE, VENOUS, WHOLE BLOOD: LACTATE BLOOD VENOUS: 1.2 mmol/L (ref 0.5–1.8)

## 2022-12-28 MED ADMIN — sildenafiL (pulm.hypertension) (REVATIO) tablet 40 mg: 40 mg | ORAL | @ 13:00:00

## 2022-12-28 MED ADMIN — sodium chloride 3 % NEBULIZER solution 4 mL: 4 mL | RESPIRATORY_TRACT | @ 01:00:00

## 2022-12-28 MED ADMIN — insulin glargine (LANTUS) injection 12 Units: 12 [IU] | SUBCUTANEOUS | @ 01:00:00

## 2022-12-28 MED ADMIN — furosemide (LASIX) injection 80 mg: 80 mg | INTRAVENOUS | @ 01:00:00 | Stop: 2022-12-27

## 2022-12-28 MED ADMIN — ondansetron (ZOFRAN-ODT) disintegrating tablet 4 mg: 4 mg | ORAL | @ 22:00:00

## 2022-12-28 MED ADMIN — magnesium oxide-Mg AA chelate (Magnesium Plus Protein) 2 tablet: 2 | ORAL | @ 01:00:00

## 2022-12-28 MED ADMIN — posaconazole (NOXAFIL) delayed released tablet 200 mg: 200 mg | ORAL | @ 13:00:00

## 2022-12-28 MED ADMIN — iloprost (VENTAVIS) nebulizer solution: 5 ug | RESPIRATORY_TRACT | @ 17:00:00

## 2022-12-28 MED ADMIN — ipratropium-albuterol (DUO-NEB) 0.5-2.5 mg/3 mL nebulizer solution 3 mL: 3 mL | RESPIRATORY_TRACT | @ 01:00:00

## 2022-12-28 MED ADMIN — iloprost (VENTAVIS) nebulizer solution: 5 ug | RESPIRATORY_TRACT | @ 14:00:00

## 2022-12-28 MED ADMIN — magnesium oxide-Mg AA chelate (Magnesium Plus Protein) 2 tablet: 2 | ORAL | @ 13:00:00

## 2022-12-28 MED ADMIN — iloprost (VENTAVIS) nebulizer solution: 5 ug | RESPIRATORY_TRACT | @ 22:00:00

## 2022-12-28 MED ADMIN — budesonide (PULMICORT) nebulizer solution 0.5 mg: .5 mg | RESPIRATORY_TRACT | @ 12:00:00

## 2022-12-28 MED ADMIN — arformoterol (BROVANA) nebulizer solution 15 mcg/2 mL: 15 ug | RESPIRATORY_TRACT | @ 01:00:00

## 2022-12-28 MED ADMIN — iloprost (VENTAVIS) nebulizer solution: 5 ug | RESPIRATORY_TRACT | @ 04:00:00 | Stop: 2022-12-28

## 2022-12-28 MED ADMIN — diclofenac sodium (VOLTAREN) 1 % gel 2 g: 2 g | TOPICAL | @ 17:00:00

## 2022-12-28 MED ADMIN — acetaminophen (TYLENOL) tablet 650 mg: 650 mg | ORAL | @ 22:00:00

## 2022-12-28 MED ADMIN — sildenafiL (pulm.hypertension) (REVATIO) tablet 40 mg: 40 mg | ORAL | @ 19:00:00

## 2022-12-28 MED ADMIN — acetaminophen (TYLENOL) tablet 650 mg: 650 mg | ORAL | @ 03:00:00

## 2022-12-28 MED ADMIN — lidocaine (ASPERCREME) 4 % 1 patch: 1 | TRANSDERMAL | @ 13:00:00

## 2022-12-28 MED ADMIN — ipratropium-albuterol (DUO-NEB) 0.5-2.5 mg/3 mL nebulizer solution 3 mL: 3 mL | RESPIRATORY_TRACT | @ 18:00:00

## 2022-12-28 MED ADMIN — gabapentin (NEURONTIN) capsule 100 mg: 100 mg | ORAL | @ 19:00:00

## 2022-12-28 MED ADMIN — furosemide (LASIX) injection 80 mg: 80 mg | INTRAVENOUS | @ 19:00:00 | Stop: 2022-12-28

## 2022-12-28 MED ADMIN — gabapentin (NEURONTIN) capsule 100 mg: 100 mg | ORAL | @ 14:00:00

## 2022-12-28 MED ADMIN — sodium chloride 3 % NEBULIZER solution 4 mL: 4 mL | RESPIRATORY_TRACT | @ 12:00:00

## 2022-12-28 MED ADMIN — calcium carbonate (TUMS) chewable tablet 200 mg elem calcium: 200 mg | ORAL | @ 01:00:00

## 2022-12-28 MED ADMIN — calcium carbonate (TUMS) chewable tablet 200 mg elem calcium: 200 mg | ORAL | @ 20:00:00

## 2022-12-28 MED ADMIN — iloprost (VENTAVIS) nebulizer solution: 5 ug | RESPIRATORY_TRACT | @ 01:00:00

## 2022-12-28 MED ADMIN — insulin lispro (HumaLOG) injection 3 Units: 3 [IU] | SUBCUTANEOUS | @ 17:00:00

## 2022-12-28 MED ADMIN — budesonide (PULMICORT) nebulizer solution 0.5 mg: .5 mg | RESPIRATORY_TRACT | @ 01:00:00

## 2022-12-28 MED ADMIN — calcium carbonate (TUMS) chewable tablet 200 mg elem calcium: 200 mg | ORAL | @ 14:00:00

## 2022-12-28 MED ADMIN — arformoterol (BROVANA) nebulizer solution 15 mcg/2 mL: 15 ug | RESPIRATORY_TRACT | @ 12:00:00

## 2022-12-28 MED ADMIN — enoxaparin (LOVENOX) syringe 40 mg: 40 mg | SUBCUTANEOUS | @ 01:00:00

## 2022-12-28 MED ADMIN — azithromycin (ZITHROMAX) tablet 250 mg: 250 mg | ORAL | @ 13:00:00 | Stop: 2025-08-25

## 2022-12-28 MED ADMIN — iloprost (VENTAVIS) nebulizer solution: 5 ug | RESPIRATORY_TRACT | @ 20:00:00

## 2022-12-28 MED ADMIN — furosemide (LASIX) injection 80 mg: 80 mg | INTRAVENOUS | @ 13:00:00 | Stop: 2022-12-28

## 2022-12-28 MED ADMIN — ipratropium-albuterol (DUO-NEB) 0.5-2.5 mg/3 mL nebulizer solution 3 mL: 3 mL | RESPIRATORY_TRACT | @ 12:00:00

## 2022-12-28 MED ADMIN — diclofenac sodium (VOLTAREN) 1 % gel 2 g: 2 g | TOPICAL | @ 22:00:00

## 2022-12-28 MED ADMIN — diclofenac sodium (VOLTAREN) 1 % gel 2 g: 2 g | TOPICAL | @ 01:00:00

## 2022-12-28 MED ADMIN — diclofenac sodium (VOLTAREN) 1 % gel 2 g: 2 g | TOPICAL | @ 10:00:00

## 2022-12-28 MED ADMIN — gabapentin (NEURONTIN) capsule 100 mg: 100 mg | ORAL | @ 01:00:00

## 2022-12-28 MED ADMIN — sildenafiL (pulm.hypertension) (REVATIO) tablet 40 mg: 40 mg | ORAL | @ 01:00:00

## 2022-12-28 NOTE — Unmapped (Signed)
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  On 10/7 began rising after diuresis of 2.7 L of urine on 10/6, and creatinine up to 2.95 at the time of consult.  DDx of prerenal from relative volume depletion, cardiorenal given narrow threshold for forward flow, congestion though suspect this is not the case in the setting of good urine output and rising CO2 despite BNP's rising over the last few days.  Volume status exam is extremely challenging in the setting of her pulmonary hypertension but fortunately has stabilized and ?maybe improved after gentle diuresis yesterday favoring cardiorenal congestive AKI rather than the opposite with improvement in her BNP. This reinforces a very narrow window of euvolemia  - Sediment 10/9 bland  - if continues to fluctuate in O2 requirements and renal function, may consider repeat right heart cath to get a sense of PA pressures and filling pressures given hypoxemia increasing? appreciate pulmonary recommendations regarding this  - agree with lasix 80mg  IV bid today for gently net neg 500cc  For now which would manage as ATN with maintenance of MAP greater than 65    # Pulmonary sarcoidosis complicated by pulmonary hypertension on chronic oxygen  - Evaluation and management per primary team  - No changes to management from a nephrology standpoint at this time    RECOMMENDATIONS:   - if continues to fluctuate in O2 requirements and renal function, may consider repeat right heart cath to get a sense of PA pressures and filling pressures given hypoxemia increasing? appreciate pulmonary recommendations regarding this  - agree with lasix 80mg  IV bid today for gently net neg 500cc  - We will continue to follow.     [name], MD  12/28/2022 9:51 AM     Medical decision-making for 12/28/22  Findings / Data     Patient has: []  acute illness w/systemic sxs  [mod]  []  two or more stable chronic illnesses [mod]  []  one chronic illness with acute exacerbation [mod]  []  acute complicated illness  [mod]  []  Undiagnosed new problem with uncertain prognosis  [mod] [x]  illness posing risk to life or bodily function (ex. AKI)  [high]  []  chronic illness with severe exacerbation/progression  [high]  []  chronic illness with severe side effects of treatment  [high] AKI Probs At least 2:  Probs, Data, Risk   I reviewed: [x]  primary team note  [x]  consultant note(s)  []  external records [x]  chemistry results  []  CBC results  []  blood gas results  []  Other []  procedure/op note(s)   []  radiology report(s)  []  micro result(s)  []  w/ independent historian(s) Pulm notes reviewed, Cr 2.9 stable; diuresing >=3 Data Review (2 of 3)    I independently interpreted: []  Urine Sediment  []  Renal US []  CXR Images  []  CT Images  []  Other []  EKG Tracing  Any     I discussed: []  Pathology results w/ QHPs(s) from other specialties  []  Procedural findings w/ QHPs(s) from other specialties []  Imaging w/  QHP(s) from other specialties  [x]  Treatment plan w/ QHP(s) from other specialties Plan discussed with primary team Any     Mgm't requires: []  Prescription drug(s)  [mod]  []  Kidney biopsy  [mod]  []  Central line placement  [mod] []  High risk medication use and/or intensive toxicity monitoring [high]  []  Renal replacement therapy [high]  []  High risk kidney biopsy  [high]  []  Escalation of care  [high]  []  High risk central line placement  [high] IV Diuresis: check BMP/Mg  Risk ____________________________________________________  Interval Hx  Feeling well, maybe better. Remains on 8L and satting well. Notes able to move to chair without chest tightness today. Switched to tyvaso.   Net neg 570 yesterday    History of Present Illness: Karen Barber is 57 y.o. 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 Nephrology has been consulted for AKI.     Patient was initially admitted 9/11 when she presented for a PET scan to evaluate her sarcoidosis but unfortunately was found to be hypoxemic on her home 6 L of nasal cannula requiring high flow nasal cannula and was admitted initially to the ICU.  She was diuresed and her oxygenation improved and she was stepped down to the MPCU.  She was treated with antibiotics as well as steroids for infection and bronchiectasis.  She underwent right heart cath on 9/20 with elevated mean PA pressures and was started on iloprost.  Notes that she has not been told she has kidney issues prior to this as she regularly follows up with a PCP.  She notes that she is not eating or drinking very well as she is worried about accumulating fluid whenever she eats salt and she does not like the food here.  Denies nausea.    Lasix dosing over the last few days has included 80 mg IV once or twice twice a day till 10/6, with diuresis holiday 10/7 and then given 60 mg IV Lasix on 10/8.    Patient does note that she is urinating less over the last few days and has to push a little bit to empty her bladder at the end.    Of note O2 requirement is gradually increasing from baseline of 6 L nasal cannula on 10/6 and 10/7 up to 9 and up to 10 L on day of consult.  Urine output of 300 up to 700 today.  Curiously proBNP has been rising the last few days though team has felt she is relatively euvolemic during this time.    Creatinine recently has steady down in the 1.6-1.7 range however starting 10/7 rising up and up to 2.95 on day of consult    INPATIENT MEDICATIONS:    Current Facility-Administered Medications:     acetaminophen (TYLENOL) tablet 650 mg, Oral, Q6H    albuterol 2.5 mg /3 mL (0.083 %) nebulizer solution 2.5 mg, Nebulization, Q6H PRN    arformoterol (BROVANA) nebulizer solution 15 mcg/2 mL, Nebulization, BID (RT)    azithromycin (ZITHROMAX) tablet 250 mg, Oral, Daily    budesonide (PULMICORT) nebulizer solution 0.5 mg, Nebulization, BID (RT)    calcium carbonate (TUMS) chewable tablet 200 mg elem calcium, Oral, TID PRN    dextrose (D10W) 10% bolus 125 mL,  Intravenous, Q10 Min PRN    diclofenac sodium (VOLTAREN) 1 % gel 2 g, Topical, QID    enoxaparin (LOVENOX) syringe 40 mg, Subcutaneous, Nightly    flu vaccine TS 2024-25(80mos up)(PF)(FLULAVAL, FLUARIX, FLUZONE), Intramuscular, During hospitalization    [Provider Hold] furosemide (LASIX) tablet 80 mg, Oral, BID    gabapentin (NEURONTIN) capsule 100 mg, Oral, TID    glucagon injection 1 mg, Intramuscular, Once PRN    iloprost (VENTAVIS) nebulizer solution, Nebulization, 6XD    insulin glargine (LANTUS) injection 12 Units, Subcutaneous, Nightly    insulin lispro (HumaLOG) injection 0-20 Units, Subcutaneous, ACHS    insulin lispro (HumaLOG) injection 3 Units, Subcutaneous, 3xd Meals    ipratropium-albuterol (DUO-NEB) 0.5-2.5 mg/3 mL nebulizer solution 3 mL, Nebulization, TID (RT)    lidocaine (ASPERCREME) 4 % 1 patch, Transdermal, Daily    magnesium oxide-Mg AA chelate (Magnesium Plus Protein) 2 tablet, Oral, BID    melatonin tablet 6 mg, Oral, Nightly PRN    [EXPIRED] Implement, , Until Discontinued **AND** [EXPIRED] Care order/instruction, , PRN **AND** [EXPIRED] Vital signs, , PRN **AND** Adult Oxygen therapy, , PRN **AND** [EXPIRED] sodium chloride (NS) 0.9 % infusion, Intravenous, Continuous PRN **AND** [EXPIRED] sodium chloride 0.9% (NS) bolus 1,000 mL, Intravenous, Daily PRN **AND** [EXPIRED] diphenhydrAMINE (BENADRYL) injection 25 mg, Intravenous, Q4H PRN **AND** [EXPIRED] famotidine (PEPCID) injection 20 mg, Intravenous, Q4H PRN **AND** methylPREDNISolone sodium succinate (PF) (SOLU-Medrol) injection 125 mg, Intravenous, Q4H PRN **AND** [EXPIRED] EPINEPHrine (EPIPEN) injection 0.3 mg, Intramuscular, Daily PRN    ondansetron (ZOFRAN-ODT) disintegrating tablet 4 mg, Oral, Q8H PRN **OR** ondansetron (ZOFRAN) injection 4 mg, Intravenous, Q8H PRN    polyethylene glycol (MIRALAX) packet 17 g, Oral, Daily PRN    posaconazole (NOXAFIL) delayed released tablet 200 mg, Oral, Daily    prochlorperazine (COMPAZINE) tablet 5 mg, Oral, Q6H PRN    senna (SENOKOT) tablet 2 tablet, Oral, Nightly PRN    sildenafiL (pulm.hypertension) (REVATIO) tablet 40 mg, Oral, TID    sodium chloride (OCEAN) 0.65 % nasal spray 1 spray, Each Nare, Q6H PRN    sodium chloride 3 % NEBULIZER solution 4 mL, Nebulization, BID (RT)    OUTPATIENT MEDICATIONS:  Prior to Admission medications    Medication Dose, Route, Frequency   ACCU-CHEK GUIDE GLUCOSE METER Misc 1 Device, Other, 4 times a day (ACHS), AS DIRECTED   albuterol 2.5 mg /3 mL (0.083 %) nebulizer solution 2.5 mg, Nebulization, Every 6 hours PRN   albuterol HFA 90 mcg/actuation inhaler 2 puffs, Inhalation, Every 4 hours PRN   arformoterol (BROVANA) 15 mcg/2 mL 15 mcg, Nebulization, 2 times daily (RT)   aspirin-caffeine (BAYER BACK AND BODY) 500-32.5 mg Tab Oral, Takes 2 1/2 pills 3-4 times a day as needed.   azithromycin (ZITHROMAX) 250 MG tablet 250 mg, Oral, Every 24 hours, Start on 9/1 after completing the levofloxacin   blood sugar diagnostic Strp Other, 3 times a day (standard), ACCU-CHEK Guide meter   budesonide (PULMICORT) 0.5 mg/2 mL nebulizer solution 0.5 mg, Nebulization, 2 times daily (RT)   furosemide (LASIX) 80 MG tablet 80 mg, Oral, 2 times a day (standard), Take a third dose as needed for increasing weight or increasing swelling.   gabapentin (NEURONTIN) 300 MG capsule 300 mg, Oral, 3 times a day (standard)   insulin aspart (NOVOLOG U-100 INSULIN ASPART) 100 unit/mL vial 1-20 Units, Subcutaneous, 4 times a day (ACHS), SLIDING SCALE   insulin glargine (BASAGLAR KWIKPEN U-100 INSULIN) 100 unit/mL (3 mL) injection pen 18 Units, Subcutaneous, Nightly   ipratropium (  ATROVENT) 0.02 % nebulizer solution 500 mcg, Nebulization, 4 times a day   posaconazole (NOXAFIL) 100 mg delayed released tablet 200 mg, Oral, Daily (standard)   sildenafiL, pulm.hypertension, (REVATIO) 20 mg tablet 20 mg, Oral, 3 times a day (standard)   OXYGEN-AIR DELIVERY SYSTEMS MISC Dose: 2-3 LPM        ALLERGIES:  Patient has no known allergies.    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 A.niger from sputum 08/2016-09/2016.    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    Sarcoidosis 1995    Type 2 diabetes mellitus with hyperglycemia (CMS-HCC) 07/27/2014    Visual impairment     glasses     Past Surgical History:   Procedure Laterality Date    LUNG LOBECTOMY      PR AMPUTATION TOE,MT-P JT Right 12/12/2019    Procedure: Right foot fifth toe amputation at the metatarsophalangeal joint level; Surgeon: Britt Bottom, DPM;  [facility]: MA[facility]  Service: Vascular    PR AMPUTATION TOE,MT-P JT Right 06/16/2020    Procedure: Right foot amputation of 3rd toe at mpj level;  Surgeon: Britt Bottom, DPM;  Location: MAIN [facility];  Service: Vascular    PR RIGHT HEART CATH O2 SATURATION & CARDIAC OUTPUT N/A 04/29/2019    Procedure: Right Heart Catheterization;  Surgeon: Rosana Hoes, MD;  Location: [facility] CATH;  Service: Cardiology    PR RIGHT HEART CATH O2 SATURATION & CARDIAC OUTPUT N/A 12/08/2022    Procedure: Right Heart Catheterization;  Surgeon: Neal Dy, MD;  Location: [facility] C[facility];  Service: Cardiology    PR THORACOSCOPY SURG TOT PULM DECORT Left 12/14/2014    Procedure: THORACOSCOPY SURG; W/TOT PULM DECORTIC/PNEUMOLYS;  Surgeon: Evert Kohl, MD;  Location: MAIN OR [facility];  Service: Cardiothoracic    PR THORACOSCOPY W/THERA WEDGE RESEXN INITIAL UNILAT Left 12/03/2014    Procedure: THORACOSCOPY, SURGICAL; WITH THERAPEUTIC WEDGE RESECTION (EG, MASS, NODULE) INITIAL UNILATERAL;  Surgeon: Evert Kohl, MD;  Location: MAIN OR [facility];  Service: Cardiothoracic     SOCIAL HISTORY  Social History     Social History Narrative    Not on file      reports that she quit smoking about 35 years ago. Her smoking use included cigarettes. She started smoking about 44 years ago. She has a 4.5 pack-year smoking history. She has never used smokeless tobacco. She reports that she does not drink alcohol and does not use drugs.   FAMILY HISTORY  Family History   Problem Relation Age of Onset    Dementia Mother     Diabetes Father     Diabetes Brother     Diabetes Maternal Aunt     Sarcoidosis Maternal Aunt  Diabetes Maternal Uncle     Diabetes Paternal Aunt     Diabetes Paternal Uncle         Physical Exam:   Vitals:    12/28/22 0046 12/28/22 0419 12/28/22 0746 12/28/22 0811   BP:  120/79 85/59    Pulse: 64 69 70 72   Resp:  24 22 19    Temp:  36.3 ??C (97.4 ??F) 36.4 ??C (97.5 ??F) TempSrc:  Oral     SpO2:  100% 100% 98%   Weight:       Height:         No intake/output data recorded.    Intake/Output Summary (Last 24 hours) at 12/28/2022 0951  Last data filed at 12/28/2022 0600  Gross per 24 hour   Intake 780 ml   Output 1350 ml   Net -570 ml     Constitutional: well-appearing, no acute distress sitting up in bed  Heart: RRR, no m/r/g  Lungs: clear bilaterally  Abd: non-distended  Ext: No significant edema

## 2022-12-28 NOTE — Unmapped (Signed)
Infectious Disease ([facility]) Progress Note    Assessment & Plan:   Karen Barber is a 57 y.o. female whose presentation is complicated by bronchiectasis type 1 in the setting of pulmonary sarcoidosis (6L  home O2), HTN, T2DM, COPD, s/p LUL lobectomy, and asthma that presented to [facility] admitted to MICU after becoming hypoxic after holding home meds prior to PET scan. After 1 week diuresing ICU stabilized patient she was transferred to the MPCU.    Principal Problem:    Acute on chronic hypoxic respiratory failure (CMS-HCC)  Active Problems:    Pulmonary sarcoidosis (CMS-HCC)    Type 2 diabetes mellitus with hyperglycemia (CMS-HCC)    COPD (chronic obstructive pulmonary disease) (CMS-HCC)    Chronic pulmonary aspergillosis (CMS-HCC)    Pulmonary hypertension (CMS-HCC)    AKI (acute kidney injury) (CMS-HCC)    Active Problems  #Acute on Chronic Hypoxemic Respiratory Failure -   #Pulmonary Sarcoidosis -   #COPD/asthma -   #Bronchiectasis  Patient has notable history of pulmonary sarcoidosis, COPD, pulmonary hypertension and is on baseline oxygen 6 L.  Recent admission with pneumonia growing Serratia marcescens  s/p 1x Rocephin (8/18), Vancomycin (8/18-8/19), Zosyn (8/18-8/23) discharged on levofloxacin 500 mg ending on 8/31.  Patient held her medications only for the morning prior to an outpatient PET CT scan during which she became hypoxic to the 70s and was admitted to the MICU.  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 She received one dose of 125mg  of IV methylprednisolone. She was weaned from high flow nasal cannula to large bore nasal cannula after IV diuresis and a course of antibiotics for possible pneumonia with Vanco/cefepime later transition to levofloxacin with a total 1 week treatment with antibiotics. Started on vanc/cefepime for broad coverage --> stopped vanc and cefepime 9/12- then levaquin (9/12---9/16).  Patient was transferred to the floor on 9/18 after showing significant signs of improvement but still having occasional desaturations with ambulation. PET scan 9/18 is not indicative of active pulmonary sarcoidosis.  Acute exacerbation likely multifactorial including in not taking her home medications as per directed for airway clearance, volume overload, and higher O2 requirement chronically than the amount she has been using. RHC 9/20 showing significant pulm HTN disease with mean PA pressure of 47, for which pulmonology team started Iloprost. Notably, wedge pressure on that study was 5, which is reassuring against volume as driver of her respiratory status at that time. Patient has been having variations in oxygen requirement.  Nephrology considering RHC, however pulmonology is against.  Continuing IV diuresis with goal of net -500 mL.  -- Continue to wean O2 with goal saturations of 90-94% per pulmonology  -- Continued 80 IV Lasix  -- Consulted palliative for goals of care conversation as well as symptom management.  -- Plan to get oxymizer for her while admitted. Will need oxymizer at home  -- Continue home airway clearance (DuoNebs and HTS 3 times daily with [place])  -- Continue home Pulmicort, Brovana, Atrovent, as needed albuterol, and azithromycin  -- PT and OT consult  -- Standing weights daily     #Pulmonary hypertension - RV failure - orthostatic hypotension  Patient has history of pulmonary hypertension due to pulmonary sarcoidosis/COPD.  TTE this admission 9/17 with severely dilated right atrium with severe pulmonary hypertension PASP 86 mmHg.  Estimated right atrial pressure 5 to 10 mmHg.  On Lasix 80 mg twice daily at home.  Echo showed LVEF greater than 55% and severe pulmonary hypertension no significant change since prior  study.  Right heart cath showed severe pulmonary Hypertension, markedly elevated PVR (9.3 - 10.5) and low cardiac output / index. HRCT scan shows unchanged fibrotic pulmonary sarcoidosis with bilateral segmental and subsegmental fibrosis post left upper lobectomy with unchanged apical right upper lobe and superior segment left lower lobe bullae.   -- Continue inhaled iloprost 5 mcg 6 times a day q4h; please do not mix with other nebulizer solutions   -- Plan to transition to Tyvaso nebs, working with pharmacy and [facility] nurse coordinator  -- [facility] rep to do teaching prior to DC    #Acute kidney injury on CKD3 (downtrending)  On admission patient's creatinine 1.3 which appears to be baseline. Cr fluctuating in 1.5-1.8 range.  -- Renally dose medications  -- Daily BMP    #Secondary Hyperparathyroidism, Hypocalcemia, and Hyperphosphatemia  Findings are consistent with secondary hyperparathyroidism due to CKD.  Ionized calcium: 4.37 mg/dL (low)  Phosphate: 5.6 mg/dL (elevated)  PTH: 161.0 pg/mL (elevated)  Vitamin D Total (25OH) 35  -- Reconsider need for future oral calcium carbonate 1600 mg TID  -- Reconsider need for future phoslo 667 mg PO TID   -- Reassess PTH levels in 4-6 weeks.    #Type 2 diabetes mellitus with hyperglycemia and hypoglycemia  Hemoglobin A1c 8.3%.  Home regimen includes glargine 18 units nightly and sliding scale insulin.  Large fluctuations in glucose greater than 300 and occasional values ~50.  -- Sliding scale insulin insulin sensitivity factor 40  --12 glargine nightly  -- 3 units lispro tid     #Left leg swelling (resolved )  Patient endorses intermittent left > right leg swelling. Venous doppler unremarkable.      Chronic Problems     #Chronic Pain   - Resuming gabapentin, though at reduced dose  - PT/OT consulted, appreciate recs     #Chronic pulmonary aspergillosis  Patient has history of aspergilloma and is on chronic posaconazole. Soluble IL-2 receptor, Aspergillus Galactomannan Antigen, IgE checked 10/2022 all wnl  -Continue home posaconazole Ppx  -Outpatient evaluation for [facility]AE scheduled for 10/25 to evaluate recurrent hemoptysis     #Iron deficiency anemia  #Chronic normocytic anemia (baseline hemoglobin 11)  Patient has some mild normocytic anemia with hemoglobin of 11. Iron panel shows iron of 37 with iron saturation of 15.   --Iron supplementation with iron dextran 1,000mg  in 250cc    #Hyperlipidemia  Last lipid profile showed an LDL of 146  -- Follow-up outpatient    Issues Impacting Complexity of Management:  -The patient is at high risk for the development of complications of volume overload due to the need to provide IV hydration for suspected hypovolemia in the setting of: Pulmonary hypertension  -Need for the following intensive monitoring parameter(s) due to high risk of clinical decline: continuous oxygen monitoring, telemetry, and frequent monitoring of urine output to assess for the efficacy of diuretic regimen  -Need for intensive oxygen therapy of HFNC, which places the patient at high risk for oxygen toxicity  -The patient is at high risk of complications from pulmonary hypertension    Daily Checklist:  Diet: Regular Diet   DVT PPx: Lovenox 40mg  q24h  Electrolytes: Replete Potassium to >/= 3.6 and Magnesium to >/= 1.8  Code Status: DNR and DNI  Dispo:  Continue MPCU    Team Contact Information:   Primary Team: Infectious Disease ([facility])  Primary Resident: Zella Richer, MD  Resident's Pager: [phone] (Infect Disease Intern - Tower)    Interval History:  No acute events overnight.     Patient reports no concerns or complaints this morning.  Continues to endorse improvement in her nausea as well as improvement in her prior paresthesias.  Denies any worsening shortness of breath, fevers, chills, body aches, new abdominal pain.  Discussed with the patient the current state of her illness and the difficulty associated with optimizing her fluid status.  She endorses understanding and appreciation for education.    ROS negative except for where otherwise noted above.    Objective:   Temp:  [36.3 ??C (97.3 ??F)-36.4 ??C (97.5 ??F)] 36.4 ??C (97.5 ??F)  Heart Rate:  [64-79] 72  SpO2 Pulse:  [68-80] 68  Resp:  [19-35] 19  BP: (85-120)/(59-79) 90/67  SpO2:  [91 %-100 %] 98 %,   Intake/Output Summary (Last 24 hours) at 12/28/2022 1324  Last data filed at 12/28/2022 1300  Gross per 24 hour   Intake 980 ml   Output 2050 ml   Net -1070 ml   , O2 Device: Large bore nasal cannula (cool high flow)  O2 Flow Rate (L/min):  [7 L/min-8 L/min] 8 L/min    Gen: NAD, converses well  HENT: Atraumatic, normocephalic  Heart: RRR  Lungs: Decreased breath sounds bilaterally, no adventitious lung sound identified.  No increased work of breathing  Abdomen: Soft, nontender nondistended  Extremities: Trace pedal edema

## 2022-12-28 NOTE — Unmapped (Signed)
VSS. Remains on 8L LBNC and slightly dyspneic with ambulating to bed from chair. Given Lasix 80mg  IV last with 1L total urine output post lasix via purewick. Pt resting most of the night.No c/o pain. Refused tylenol. Call bell within reached.  Problem: Adult Inpatient Plan of Care  Goal: Plan of Care Review  Outcome: Ongoing - Unchanged  Goal: Patient-Specific Goal (Individualized)  Outcome: Ongoing - Unchanged  Goal: Absence of Hospital-Acquired Illness or Injury  Outcome: Ongoing - Unchanged  Intervention: Identify and Manage Fall Risk  Recent Flowsheet Documentation  Taken 12/27/2022 2000 by Greggory Stallion, RN  Safety Interventions:   aspiration precautions   fall reduction program maintained   lighting adjusted for tasks/safety   low bed  Intervention: Prevent Infection  Recent Flowsheet Documentation  Taken 12/27/2022 2000 by Greggory Stallion, RN  Infection Prevention:   hand hygiene promoted   rest/sleep promoted   single patient room provided  Goal: Optimal Comfort and Wellbeing  Outcome: Ongoing - Unchanged  Goal: Readiness for Transition of Care  Outcome: Ongoing - Unchanged  Goal: Rounds/Family Conference  Outcome: Ongoing - Unchanged     Problem: Fall Injury Risk  Goal: Absence of Fall and Fall-Related Injury  Outcome: Ongoing - Unchanged  Intervention: Promote Scientist, clinical (histocompatibility and immunogenetics) Documentation  Taken 12/27/2022 2000 by Greggory Stallion, RN  Safety Interventions:   aspiration precautions   fall reduction program maintained   lighting adjusted for tasks/safety   low bed     Problem: Gas Exchange Impaired  Goal: Optimal Gas Exchange  Outcome: Ongoing - Unchanged  Intervention: Optimize Oxygenation and Ventilation  Recent Flowsheet Documentation  Taken 12/28/2022 0200 by Oscar Forman, RN  Head of Bed [facility]) Positioning: HOB at 30 degrees  Taken 12/28/2022 0005 by Demetries Coia, RN  Head of Bed [facility]) Positioning: HOB at 30 degrees  Taken 12/28/2022 0000 by Hughey Rittenberry, RN  Head of Bed (HOB) Positioning: HOB at 20-30 degrees     Problem: Comorbidity Management  Goal: Blood Glucose Levels Within Targeted Range  Outcome: Ongoing - Unchanged  Goal: Blood Pressure in Desired Range  Outcome: Ongoing - Unchanged     Problem: Skin Injury Risk Increased  Goal: Skin Health and Integrity  Outcome: Ongoing - Unchanged  Intervention: Optimize Skin Protection  Recent Flowsheet Documentation  Taken 12/28/2022 0200 by Hodges Treiber, RN  Head of Bed [facility]) Positioning: HOB at 30 degrees  Taken 12/28/2022 0005 by Greggory Stallion, RN  Activity Management: back to bed  Head of Bed [facility]) Positioning: HOB at 30 degrees  Taken 12/28/2022 0000 by Burnette Valenti, RN  Head of Bed [facility]) Positioning: HOB at 20-30 degrees  Taken 12/27/2022 2000 by Greggory Stallion, RN  Activity Management: up in chair

## 2022-12-28 NOTE — Unmapped (Signed)
Pulmonary Consult Service  Consult Follow Up Note      Primary Service:   Medicine  Primary Service Attending:  Ebony Hail, MD  Reason for Consult:   Assistance with diuresis    ASSESSMENT and PLAN     Karen Barber is a 57 y.o. 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 Pt was found to have acute on chronic respiratory failure (sat in 70s on home 6L) in the setting of not taking her home medications (sildenafil, lasix, nebs, inhalers) in preparation for PET scan, admitted 9/11. She was admitted to the ICU initially, has since been weaned back to 6-8L [place] with airway clearance and diuresis. Her acute on chronic hypoxemia and increase O2 requirement was likely multifactorial -- combination of volume overload as well as lack of airway clearance at home, however I think chronically her O2 requirement (related to her PH) is higher than the amount she has been on at home and she continues to have severe exertional hypoxemia probably due to her parenchymal disease, at least in part.      Working on weaning her back to her home oxygen requirements. Unclear if iloprost has helped with symptoms and/or oxygenation.  Continuing to have challenges managing volume status and renal function. She unfortunately has a tenuous balance and now has worsening renal function. She is unfortunately not a transplant candidate due to PA plasty per chart review/anatomic concerns related to this.     RV Failure - PAH (Group 3, 5)  RHC 9/20 demonstrated severe pulmonary hypertension, markedly elevated PVR (9.3-10.5) and low CO/CI  HRCT completed for PH-ILD diagnosis and Tyvaso enrollment sent to [facility]. She is planned to continue Tyvaso at discharge. Her oxygen requirements are still 8-10L.   I agree with nephrology that AKI at this time is mostly due to subtle volume overload with poor cardiac output & decreased renal blood flow. Diuresis per primary team and nephrology. Low threshold for inotropic support if needed -- although overall her exam & her functional improvement since starting iloprost are reassuring.      Plan:  - Diuresis per primary team and nephrology -  Cr seems to have plateaued ~3   - appreciate nephrology assistance- seems like she has tolerated diuresis with relatively stable Cr, think reasonable to continue today and consider transition to oral regimen in the next day or two  - Tyvaso teaching will take place when there is a confirmed discharge date. Patient does not currently have delivery device.  - Order repeat limited echo to eval RV size and systolic function   - Would defer RHC as this time as not really likely to change management regarding tyvaso (ie if worse would continue, if better would also continue). She is WWP, no elevated lactate to suggest cardiogenic shock.   - Continue inhaled iloprost 5 mcg q4h. Tyvaso at discharge.   - Continue sildenafil 40 mg TID  - Continue daily weight with strict I/O  - cont daily pro-BNP    Acute on Chronic Hypoxemic Respiratory Failure  COPD/Asthma  Pulmonary Sarcoidosis c/b Bronchiectasis  Soluble IL-2 receptor, Aspergillus Galactomannan Antigen, IgE checked 10/2022 all wnl. PET scan 9/17 does not appear to be active pulmonary sarcoidosis and again it is overall felt to be burned out from sarcoidosis standpoint.  She is now stable satting at >95% on 8-10L via [facility]. It is likely she has higher O2 requirement chronically than the amount she has been using at home. This may reflect her new baseline and this was discussed with the patient. Discussion had with the patient about more realistic oxygen sats for her and limits on activity at home to prevent desats.     Plan:  - Set O2 sat goal at 90-94%, wean oxygen as tolerated  - continue home pulmicort, brovana, PRN albuterol, azithromycin  - please cont airway clearance:   - duonebs TID and HTS (3%) BID with aerobika    Chronic Pulmonary Aspergillosis   Aspergillus Galactomannan Antigen, IgE checked 10/2022 all wnl.   - continue home posaconazole   - pt has outpt evaluation for [facility]AE scheduled for 10/25 for history of recurrent hemoptysis    Ms. Kari was seen, examined and discussed with  Dr. Lurena Nida  Thank you for involving [place] in her care. We look forward to following with you.  Recommendations were communicated to the primary team at 10:14 AM  Please page [place] with any questions or concerns at [phone] (pulmonary consult fellow).    [employer]    I attest that I have reviewed the student note and that the components of the history of the present illness, the physical exam, and the assessment and plan documented were performed by me or were performed in my presence by the student where I verified the documentation and performed (or re-performed) the exam and medical decision making.   Kandis Fantasia, MD      HPI:     Interval Hx: Feeling better today, dizziness and nausea improved. Hoping to go home soon, misses her dogs. Ankles feel less swollen with more urine output yesterday.     History of Present Illness:  Karen Barber is a 57 y.o. female with hx pf pulmonary sarcoidosis, COPD, PAH (baseline home O2 6L), chronic pulmonary aspergillosis, s/p LUL lobectomy 2016 who initially presented to the hospital for a PET scan to evaluate her sarcoidosis.     Pt was found to have acute on chronic respiratory failure (sat in 70s on home 6L) in the setting of not taking her home medications (sildenafil, lasix, nebs, inhalers) in preparation for PET scan. As her respiratory status did not improve with NRB, placed on HFNC. 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 She received one dose of 125mg  of IV methylprednisolone and then was transferred to the MICU for further management of her hypoxia. Pt had a week long stay in the MICU and was weaned from HFNC to [facility] after IV diuresis and course of antibiotics (completed course of levaquin) for possible pneumonia. Pt has been continuing home airway clearance (Aerobika and HTS neb), and home pulmicort, brovana, atrovent, and PRN albuterol. Showing significant signs of improvement but still having desaturations with ambulation.     Pt started iloprost 9/19 and has tolerated it well so far. RHC 9/20 demonstrated severe pulmonary hypertension, markedly elevated PVR (9.3-10.5) and low cardiac output/index. HRCT chest showed unchanged fibrotic pulmonary sarcoidosis with bilateral segmental and subsegmental fibrosis post left upper lobectomy with unchanged apical right upper lobe and superior segment left lower lobe bullae.     Of note, has been hospitalized twice prior to this in the past 6 weeks.  8/18-8/25: presented with increased O2 requirement, increased swelling, brown sputum production.   - treated for serratia pneumonia (completed levaquin)  - Was discharged  on AVAPS for home, as well as with HTS/albuterol and [place].  10/21/22-10/25/22: presented with DOE, up to 8-10L with exertion and desats to 70%.   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 A.niger from sputum 08/2016-09/2016.    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    Sarcoidosis 1995    Type 2 diabetes mellitus with hyperglycemia (CMS-HCC) 07/27/2014    Visual impairment     glasses     Past Surgical History:   Procedure Laterality Date    LUNG LOBECTOMY      PR AMPUTATION TOE,MT-P JT Right 12/12/2019    Procedure: Right foot fifth toe amputation at the metatarsophalangeal joint level;  Surgeon: Britt Bottom, DPM;  Location: MAIN OR [facility]  Service: Vascular    PR AMPUTATION TOE,MT-P JT Right 06/16/2020    Procedure: Right foot amputation of 3rd toe at mpj level;  Surgeon: Britt Bottom, DPM;  Location: MAIN OR [facility];  Service: Vascular    PR RIGHT HEART CATH O2 SATURATION & CARDIAC OUTPUT N/A 04/29/2019    Procedure: Right Heart Catheterization;  Surgeon: Rosana Hoes, MD;  Location: [facility] CATH;  Service: Cardiology    PR RIGHT HEART CATH O2 SATURATION & CARDIAC OUTPUT N/A 12/08/2022    Procedure: Right Heart Catheterization;  Surgeon: Neal Dy, MD;  Location: [facility] CATH;  Service: Cardiology    PR THORACOSCOPY SURG TOT PULM DECORT Left 12/14/2014    Procedure: THORACOSCOPY SURG; W/TOT PULM DECORTIC/PNEUMOLYS;  Surgeon: Evert Kohl, MD;  Location: MAIN [facility];  Service: Cardiothoracic    PR THORACOSCOPY W/THERA WEDGE RESEXN INITIAL UNILAT Left 12/03/2014    Procedure: THORACOSCOPY, SURGICAL; WITH THERAPEUTIC WEDGE RESECTION (EG, MASS, NODULE) INITIAL UNILATERAL;  Surgeon: Evert Kohl, MD;  Location: MAIN OR [facility];  Service: Cardiothoracic       Other History:  Family History   Problem Relation Age of Onset    Dementia Mother     Diabetes Father     Diabetes Brother     Diabetes Maternal Aunt     Sarcoidosis Maternal Aunt     Diabetes Maternal Uncle     Diabetes Paternal Aunt     Diabetes Paternal Uncle      Social History     Socioeconomic History    Marital status: Single   Tobacco Use    Smoking status: Former     Current packs/day: 0.00     Average packs/day: 0.5 packs/day for 9.0 years (4.5 ttl pk-yrs)     Types: Cigarettes     Start date: 05/02/1978     Quit date: 05/03/1987  Years since quitting: 35.6    Smokeless tobacco: Never   Vaping Use    Vaping status: Never Used   Substance and Sexual Activity    Alcohol use: No     Alcohol/week: 0.0 standard drinks of alcohol    Drug use: No    Sexual activity: Yes   Other Topics Concern    Do you use sunscreen? No    Tanning bed use? No    Are you easily burned? No    Excessive sun exposure? No    Blistering sunburns? No     Social Determinants of Health     Financial Resource Strain: Low Risk  (11/06/2022)    Overall Financial Resource Strain (CARDIA)     Difficulty of Paying Living Expenses: Not hard at all   Food Insecurity: No Food Insecurity (11/06/2022)    Hunger Vital Sign     Worried About Running Out of Food in the Last Year: Never true     Ran Out of Food in the Last Year: Never true   Transportation Needs: No Transportation Needs (11/06/2022)    PRAPARE - Therapist, art (Medical): No     Lack of Transportation (Non-Medical): No       Home Medications:  No current facility-administered medications on file prior to encounter.     Current Outpatient Medications on File Prior to Encounter   Medication Sig Dispense Refill    ACCU-CHEK GUIDE GLUCOSE METER Misc 1 Device by Other route Four (4) times a day (before meals and nightly). AS DIRECTED 1 kit 0    albuterol 2.5 mg /3 mL (0.083 %) nebulizer solution Inhale 3 mL (2.5 mg total) by nebulization every six (6) hours as needed for wheezing or shortness of breath (airway clearance/cough). 360 mL 3    albuterol HFA 90 mcg/actuation inhaler Inhale 2 puffs every four (4) hours as needed for wheezing or shortness of breath. 54 g 0    arformoterol (BROVANA) 15 mcg/2 mL Inhale 2 mL (15 mcg total) by nebulization in the morning and 2 mL (15 mcg total) in the evening. 120 mL 2    aspirin-caffeine (BAYER BACK AND BODY) 500-32.5 mg Tab Take by mouth. Takes 2 1/2 pills 3-4 times a day as needed.      azithromycin (ZITHROMAX) 250 MG tablet Take 1 tablet (250 mg total) by mouth daily. Start on 9/1 after completing the levofloxacin 90 tablet 0    blood sugar diagnostic Strp by Other route Three (3) times a day. ACCU-CHEK Guide meter 300 strip 1    budesonide (PULMICORT) 0.5 mg/2 mL nebulizer solution Inhale 2 mL (0.5 mg total) by nebulization in the morning and 2 mL (0.5 mg total) in the evening. 120 mL 2    furosemide (LASIX) 80 MG tablet Take 1 tablet (80 mg total) by mouth two (2) times a day. Take a third dose as needed for increasing weight or increasing swelling. 60 tablet 0    gabapentin (NEURONTIN) 300 MG capsule Take 1 capsule (300 mg total) by mouth Three (3) times a day. 270 capsule 3    insulin aspart (NOVOLOG U-100 INSULIN ASPART) 100 unit/mL vial Inject 0.01-0.2 mL (1-20 Units total) under the skin Four (4) times a day (before meals and nightly). SLIDING SCALE 20 mL 1    insulin glargine (BASAGLAR KWIKPEN U-100 INSULIN) 100 unit/mL (3 mL) injection pen Inject 0.18 mL (18 Units total) under the skin nightly. 15 mL 0  ipratropium (ATROVENT) 0.02 % nebulizer solution Inhale 2.5 mL (500 mcg total) by nebulization four (4) times a day. 300 mL 0    posaconazole (NOXAFIL) 100 mg delayed released tablet Take 2 tablets by mouth once daily 60 tablet 0    sildenafiL, pulm.hypertension, (REVATIO) 20 mg tablet Take 1 tablet (20 mg total) by mouth Three (3) times a day. 90 tablet 0    OXYGEN-AIR DELIVERY SYSTEMS MISC Dose: 2-3 LPM         In-Hospital Medications:  Scheduled:   acetaminophen  650 mg Oral Q6H    arformoterol  15 mcg Nebulization BID (RT)    azithromycin  250 mg Oral Daily    budesonide  0.5 mg Nebulization BID (RT)    diclofenac sodium  2 g Topical QID    enoxaparin (LOVENOX) injection  40 mg Subcutaneous Nightly    flu vacc [id number] 6mos up(PF)  0.5 mL Intramuscular During hospitalization    [Provider Hold] furosemide  80 mg Oral BID gabapentin  100 mg Oral TID    iloprost  5 mcg Nebulization 6XD    insulin glargine  12 Units Subcutaneous Nightly    insulin lispro  0-20 Units Subcutaneous ACHS    insulin lispro  3 Units Subcutaneous 3xd Meals    ipratropium-albuterol  3 mL Nebulization TID (RT)    lidocaine  1 patch Transdermal Daily    magnesium oxide-Mg AA chelate  2 tablet Oral BID    posaconazole  200 mg Oral Daily    sildenafiL (pulm.hypertension)  40 mg Oral TID    sodium chloride  4 mL Nebulization BID (RT)         PRN Medications:  albuterol, calcium carbonate, dextrose in water, glucagon, melatonin, [EXPIRED] Implement **AND** [EXPIRED] Care order/instruction **AND** [EXPIRED] Vital signs **AND** Adult Oxygen therapy **AND** [EXPIRED] sodium chloride **AND** [EXPIRED] sodium chloride 0.9% **AND** [EXPIRED] diphenhydrAMINE **AND** [EXPIRED] famotidine **AND** methylPREDNISolone sodium succinate **AND** [EXPIRED] EPINEPHrine, ondansetron **OR** ondansetron, polyethylene glycol, prochlorperazine, senna, sodium chloride    Allergies:  Allergies as of 11/29/2022    (No Known Allergies)         PHYSICAL EXAM:   BP 90/67  - Pulse 72  - Temp 36.4 ??C (97.5 ??F)  - Resp 19  - Ht 180.3 cm (5' 11)  - Wt 86.6 kg (190 lb 14.7 oz)  - LMP  (LMP Unknown)  - SpO2 98%  - BMI 26.63 kg/m??   General: Alert, well-appearing, and in no distress.  Eyes: Anicteric sclera, conjunctiva clear.  ENT:   no drainage.  Cardiovascular: Regular rate and rhythm.  Pulmonary: Diminished lung sounds in bilateral upper lobes, crackles bilateral bases  Musculoskeletal: No clubbing. trace LE edema improved from prior exams  Skin: no lesions noted on clothed exam  Neuro: No focal neurological deficits.    LABORATORY and RADIOLOGY DATA:     Pertinent Laboratory Data:  Lab Results   Component Value Date    WBC 2.8 (L) 12/28/2022    HGB 11.0 (L) 12/28/2022    HCT 33.7 (L) 12/28/2022    PLT 145 (L) 12/28/2022       Lab Results   Component Value Date    NA 138 12/28/2022    K 4.8 12/28/2022    CL 96 (L) 12/28/2022    CO2 37.0 (H) 12/28/2022    BUN 50 (H) 12/28/2022    CREATININE 2.93 (H) 12/28/2022    GLU 81 12/28/2022    CALCIUM 9.7 12/28/2022  MG 2.6 12/28/2022    PHOS 5.3 (H) 12/25/2022       Lab Results   Component Value Date    BILITOT 0.7 12/25/2022    BILIDIR <0.10 05/30/2019    PROT 7.3 12/25/2022    ALBUMIN 3.6 12/25/2022    ALT 20 12/25/2022    AST 27 12/25/2022    ALKPHOS 91 12/25/2022    GGT 60 (H) 05/31/2011       Lab Results   Component Value Date    INR 1.02 12/11/2019    APTT 30.9 12/11/2019       Pertinent Micro Data:  Microbiology Results (last day)       ** No results found for the last 24 hours. **             Pertinent Imaging Data:  9/11 CXR   - Extensive fibrotic changes and biapical bulla appear grossly unchanged. Extensive pulmonary fibrosis limits evaluation for superimposed infection.     9/17 Echo    Summary    1. Limited study.    2. The left ventricle is normal in size with normal wall thickness.    3. The left ventricular systolic function is normal, LVEF is visually  estimated at > 55%.    4. The right ventricle is severely dilated in size, with mildly reduced  systolic function.    5. There is severe pulmonary hypertension.    6. TR maximum velocity: 4.4 m/s  Estimated PASP: 86 mmHg.    7. IVC size and inspiratory change suggest mildly elevated right atrial  pressure. (5-10 mmHg).    8. No significant change since prior study from 10/23/2022    9/18 PET CT  Impression   Bulky, partially calcified lymph nodes in the mediastinum and bilateral hilar with no significant to mild uptake, decreased in intensity since prior.   Grossly unchanged metabolically active right supraclavicular lymph nodes, accounting for differences in positioning of the arms and lack of IV contrast.   Bronchiectasis and partially metabolically active reticular opacities are seen bilaterally, most prominent in the lower lobes, overall similar to prior.   Dilated main pulmonary artery measuring up to 4.0 cm, suggestive of pulmonary arterial hypertension.     9/20 RHC  FINAL CARDIAC CATHETERIZATION REPORT    CONCLUSIONS:  - Severe pulmonary Hypertension  - Markedly elevated PVR (9.3 - 10.5)  - Low cardiac output / index     9/25 CXR  No significant interval change since the prior chest radiograph.     9/25 CT Chest High Resolution w/o contrast  Unchanged fibrotic pulmonary sarcoidosis with bilateral segmental and subsegmental fibrosis post left upper lobectomy with unchanged apical right upper lobe and superior segment left lower lobe bullae.     9/27 CXR  Fibrotic interstitial lung disease with biapical large bullae which are better characterized on the prior CT chest. No significant interval changes.     9/28 CXR  Fibrotic interstitial lung disease with biapical large bullae which are better characterized on the prior CT chest. No significant interval changes.    10/4 CXR  Unchanged combined pulmonary fibrosis and emphysema with biapical bullae.     10/7 CXR  Little interval change from prior.     10/8 Renal US  FINDINGS:      KIDNEYS: Normal size and echogenicity. No solid masses or calculi. No hydronephrosis.        Right kidney: 9.7 cm        Left kidney: 9.7 cm  BLADDER: Small volume of echogenic debris within the bladder, nonspecific.        Bladder volume prevoid: 125.4 mL

## 2022-12-28 NOTE — Unmapped (Signed)
Patient on [facility] 5-6LPM.  All doses of Iloprost administered Q3. SpO2 of 92-100% up in chair.

## 2022-12-29 LAB — CBC
HEMATOCRIT: 34.6 % (ref 34.0–44.0)
HEMOGLOBIN: 11.5 g/dL (ref 11.3–14.9)
MEAN CORPUSCULAR HEMOGLOBIN CONC: 33.1 g/dL (ref 32.0–36.0)
MEAN CORPUSCULAR HEMOGLOBIN: 27.8 pg (ref 25.9–32.4)
MEAN CORPUSCULAR VOLUME: 83.8 fL (ref 77.6–95.7)
MEAN PLATELET VOLUME: 8.3 fL (ref 6.8–10.7)
PLATELET COUNT: 145 10*9/L — ABNORMAL LOW (ref 150–450)
RED BLOOD CELL COUNT: 4.13 10*12/L (ref 3.95–5.13)
RED CELL DISTRIBUTION WIDTH: 17.5 % — ABNORMAL HIGH (ref 12.2–15.2)
WBC ADJUSTED: 2.8 10*9/L — ABNORMAL LOW (ref 3.6–11.2)

## 2022-12-29 LAB — MAGNESIUM
MAGNESIUM: 2.7 mg/dL — ABNORMAL HIGH (ref 1.6–2.6)
MAGNESIUM: 2.7 mg/dL — ABNORMAL HIGH (ref 1.6–2.6)

## 2022-12-29 LAB — BASIC METABOLIC PANEL
ANION GAP: 2 mmol/L — ABNORMAL LOW (ref 5–14)
ANION GAP: 3 mmol/L — ABNORMAL LOW (ref 5–14)
BLOOD UREA NITROGEN: 56 mg/dL — ABNORMAL HIGH (ref 9–23)
BLOOD UREA NITROGEN: 54 mg/dL — ABNORMAL HIGH (ref 9–23)
BUN / CREAT RATIO: 21
BUN / CREAT RATIO: 22
CALCIUM: 9.8 mg/dL (ref 8.7–10.4)
CALCIUM: 9.5 mg/dL (ref 8.7–10.4)
CHLORIDE: 97 mmol/L — ABNORMAL LOW (ref 98–107)
CHLORIDE: 100 mmol/L (ref 98–107)
CO2: 40 mmol/L — ABNORMAL HIGH (ref 20.0–31.0)
CO2: 36 mmol/L — ABNORMAL HIGH (ref 20.0–31.0)
CREATININE: 2.68 mg/dL — ABNORMAL HIGH
CREATININE: 2.5 mg/dL — ABNORMAL HIGH
EGFR CKD-EPI (2021) FEMALE: 20 mL/min/{1.73_m2} — ABNORMAL LOW (ref >=60)
EGFR CKD-EPI (2021) FEMALE: 22 mL/min/{1.73_m2} — ABNORMAL LOW (ref >=60)
GLUCOSE RANDOM: 124 mg/dL (ref 70–179)
GLUCOSE RANDOM: 159 mg/dL (ref 70–179)
POTASSIUM: 4.9 mmol/L — ABNORMAL HIGH (ref 3.4–4.8)
POTASSIUM: 5.2 mmol/L — ABNORMAL HIGH (ref 3.4–4.8)
SODIUM: 139 mmol/L (ref 135–145)
SODIUM: 139 mmol/L (ref 135–145)

## 2022-12-29 LAB — LACTATE, VENOUS, WHOLE BLOOD: LACTATE BLOOD VENOUS: 1.2 mmol/L (ref 0.5–1.8)

## 2022-12-29 LAB — PRO-BNP: PRO-BNP: 8979 pg/mL — ABNORMAL HIGH (ref <=300.0)

## 2022-12-29 MED ADMIN — aluminum-magnesium hydroxide-simethicone (MAALOX MAX) 80-80-8 mg/mL oral suspension: 30 mL | ORAL | @ 17:00:00

## 2022-12-29 MED ADMIN — sodium chloride 3 % NEBULIZER solution 4 mL: 4 mL | RESPIRATORY_TRACT | @ 01:00:00

## 2022-12-29 MED ADMIN — acetaminophen (TYLENOL) tablet 650 mg: 650 mg | ORAL | @ 17:00:00

## 2022-12-29 MED ADMIN — acetaminophen (TYLENOL) tablet 650 mg: 650 mg | ORAL | @ 22:00:00

## 2022-12-29 MED ADMIN — arformoterol (BROVANA) nebulizer solution 15 mcg/2 mL: 15 ug | RESPIRATORY_TRACT | @ 01:00:00

## 2022-12-29 MED ADMIN — insulin lispro (HumaLOG) injection 3 Units: 3 [IU] | SUBCUTANEOUS | @ 19:00:00

## 2022-12-29 MED ADMIN — sildenafiL (pulm.hypertension) (REVATIO) tablet 40 mg: 40 mg | ORAL | @ 19:00:00

## 2022-12-29 MED ADMIN — aluminum-magnesium hydroxide-simethicone (MAALOX MAX) 80-80-8 mg/mL oral suspension: 30 mL | ORAL | @ 01:00:00

## 2022-12-29 MED ADMIN — magnesium oxide-Mg AA chelate (Magnesium Plus Protein) 2 tablet: 2 | ORAL

## 2022-12-29 MED ADMIN — iloprost (VENTAVIS) nebulizer solution: 5 ug | RESPIRATORY_TRACT | @ 05:00:00

## 2022-12-29 MED ADMIN — ipratropium-albuterol (DUO-NEB) 0.5-2.5 mg/3 mL nebulizer solution 3 mL: 3 mL | RESPIRATORY_TRACT | @ 13:00:00

## 2022-12-29 MED ADMIN — arformoterol (BROVANA) nebulizer solution 15 mcg/2 mL: 15 ug | RESPIRATORY_TRACT | @ 12:00:00

## 2022-12-29 MED ADMIN — budesonide (PULMICORT) nebulizer solution 0.5 mg: .5 mg | RESPIRATORY_TRACT | @ 12:00:00

## 2022-12-29 MED ADMIN — magnesium oxide-Mg AA chelate (Magnesium Plus Protein) 2 tablet: 2 | ORAL | @ 13:00:00

## 2022-12-29 MED ADMIN — furosemide (LASIX) injection 80 mg: 80 mg | INTRAVENOUS | @ 22:00:00 | Stop: 2022-12-29

## 2022-12-29 MED ADMIN — aluminum-magnesium hydroxide-simethicone (MAALOX MAX) 80-80-8 mg/mL oral suspension: 30 mL | ORAL | @ 08:00:00

## 2022-12-29 MED ADMIN — gabapentin (NEURONTIN) capsule 100 mg: 100 mg | ORAL | @ 13:00:00

## 2022-12-29 MED ADMIN — gabapentin (NEURONTIN) capsule 100 mg: 100 mg | ORAL | @ 19:00:00

## 2022-12-29 MED ADMIN — iloprost (VENTAVIS) nebulizer solution: 5 ug | RESPIRATORY_TRACT | @ 19:00:00

## 2022-12-29 MED ADMIN — enoxaparin (LOVENOX) syringe 40 mg: 40 mg | SUBCUTANEOUS

## 2022-12-29 MED ADMIN — budesonide (PULMICORT) nebulizer solution 0.5 mg: .5 mg | RESPIRATORY_TRACT | @ 01:00:00

## 2022-12-29 MED ADMIN — iloprost (VENTAVIS) nebulizer solution: 5 ug | RESPIRATORY_TRACT | @ 01:00:00

## 2022-12-29 MED ADMIN — sildenafiL (pulm.hypertension) (REVATIO) tablet 40 mg: 40 mg | ORAL

## 2022-12-29 MED ADMIN — ondansetron (ZOFRAN-ODT) disintegrating tablet 4 mg: 4 mg | ORAL | @ 13:00:00

## 2022-12-29 MED ADMIN — ipratropium-albuterol (DUO-NEB) 0.5-2.5 mg/3 mL nebulizer solution 3 mL: 3 mL | RESPIRATORY_TRACT | @ 18:00:00

## 2022-12-29 MED ADMIN — prochlorperazine (COMPAZINE) tablet 5 mg: 5 mg | ORAL | @ 02:00:00

## 2022-12-29 MED ADMIN — iloprost (VENTAVIS) nebulizer solution: 5 ug | RESPIRATORY_TRACT | @ 14:00:00

## 2022-12-29 MED ADMIN — sildenafiL (pulm.hypertension) (REVATIO) tablet 40 mg: 40 mg | ORAL | @ 13:00:00

## 2022-12-29 MED ADMIN — acetaminophen (TYLENOL) tablet 650 mg: 650 mg | ORAL | @ 03:00:00

## 2022-12-29 MED ADMIN — gabapentin (NEURONTIN) capsule 100 mg: 100 mg | ORAL

## 2022-12-29 MED ADMIN — posaconazole (NOXAFIL) delayed released tablet 200 mg: 200 mg | ORAL | @ 13:00:00

## 2022-12-29 MED ADMIN — insulin glargine (LANTUS) injection 12 Units: 12 [IU] | SUBCUTANEOUS

## 2022-12-29 MED ADMIN — iloprost (VENTAVIS) nebulizer solution: 5 ug | RESPIRATORY_TRACT | @ 22:00:00

## 2022-12-29 MED ADMIN — furosemide (LASIX) tablet 80 mg: 80 mg | ORAL | @ 17:00:00 | Stop: 2022-12-29

## 2022-12-29 MED ADMIN — ipratropium-albuterol (DUO-NEB) 0.5-2.5 mg/3 mL nebulizer solution 3 mL: 3 mL | RESPIRATORY_TRACT | @ 01:00:00

## 2022-12-29 MED ADMIN — iloprost (VENTAVIS) nebulizer solution: 5 ug | RESPIRATORY_TRACT | @ 16:00:00

## 2022-12-29 MED ADMIN — azithromycin (ZITHROMAX) tablet 250 mg: 250 mg | ORAL | @ 13:00:00 | Stop: 2025-08-25

## 2022-12-29 MED ADMIN — sodium chloride 3 % NEBULIZER solution 4 mL: 4 mL | RESPIRATORY_TRACT | @ 13:00:00

## 2022-12-29 NOTE — Unmapped (Signed)
Palliative Care Progress Note    Consultation from Requesting Attending Physician:  Ebony Hail, MD  Primary Care Provider:  Loran Senters, FNP    Code Status:   Code Status: DNR and DNI   Healthcare decision-maker if lacks capacity:   HCDM (patient stated preference) (Active): Puleo,Bryant - Brother [phone]    HCDM, back-up (If primary HCDM is unavailable): [name] - Domestic Partner [phone]     Assessment/Plan:      SUMMARY:  This 57 y.o. 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 Intially admitted to the MICU for respiratory failure requiring HiFlo nasal canula. Now with slowly improving respiratory failure. Hospital course also complicated by AKI on CKD intermittently limiting diuresis.     Symptom Assessment and Recommendations:      # Nausea without vomiting: Nausea seems to correlate around the time of her Iloprost nebulizer treatments and with increasing O2 requirements. Also nausea with taking medications on empty stomach. Has had some relief with ODT Zofran as well as Tums. Suspect nausea may be multifactorial including medication/nebulizer induced, possible volume overload (gut edema), and poor po intake.   - CONTINUE Zofran 4 mg q8hr PRN nausea ODT or IV if unable to tolerate po   - Continue Compazine 5 mg PO q6hr PRN nausea (2nd line if unrelieved by Zofran)   - Discussed with patient about her family bringing a bedside fan which has helped her at home with nausea and sleeping  - Encouraged her to eat small snacks/meals prior to her medications; we also talked about ginger products including ginger ale and ginger chews for which she will discuss with her brother to bring   - Would recommend discharging with PRN Zofran and/or Compazine to help with nausea if she has this when starting the Tyvaso      # Pain in neck and upper back and Paraesthesia in lower extremities: Stable. Neck and back pain mainly positional in nature and secondary to posture/hospital bed. Lower extremity paraesthesia and pain previously 2/2 diabetic neuropathy but more recently from volume overload/LE swelling, now improving with diuresis.   - CONTINUE Gabapentin 100 mg TID -> current dose working well for patient (was previously on 300 mg TID at home)  - CONTINUE scheduled Lidocaine patches (can switch to two patches if needed)  - Continue scheduled Tylenol 650 mg every 6 hours  - Continue working with PT/OT      # Dyspnea: 2/2 to underlying pulmonary sarcoidosis and PAH. Dyspnea mainly with exertion or lying flat. Her goal is to get home with supplemental oxygen.   - Diuresis and PAH management per primary team   - Discussed bedside fan with patient as this can help with air hunger when in bed (she will talk with her brother about bringing)  - For now would hold on opiates for air hunger/dyspnea however this is something that can be considered at low dose if exertional dyspnea becomes more of a burden for her/barrier to discharge home      # Insomnia: Minimal sleep since being admitted due to necessary interventions waking her at night.   - Try to minimize night time awakening as much as possible  - Consider scheduling Melatonin (currently  PRN)      # Bowel Regimen: 1 BM on 10/10 but prior to this had gone at least 2 days without BM   - Would scheduled Miralax daily given intermittent constipation and high risk for constipation with regular zofran use        Goals of care / ACP:    Decisional capacity at time of visit:  yes    Current goals of care:  DNR/DNI  Jackie's main goal is to be discharged home when medically stable to continue her recovery from this hospitalization. She feels she will be able to care for her self at home and her brother lives close by and can help her as needed. She says she is  not ready to give up and would want escalation of care if needed during this hospitalization however would not want life support including intubation/mechanical ventilation, CPR, or feeding tubes.     She has [facility] Directive form at bedside and will look at filling this out. This can be notarized while inpatient if she would like.         Practical, Emotional, Spiritual Support / Other Communication and Counseling:      Feeling depressed about still be in the hospital but reassured about the news of improving kidney function this AM. Still very eager to get home as soon as safe.     - Continue chaplain support   - Therapy dog if able (seems like they only come to the hospital every other Thursday)         Thank you for this consult. The palliative care team will SIGN OFF at this time but please page Palliative Care [phone]) if there are any questions or other concerns arise.     Subjective:     Recent Events:      Ms. Shoen does endorse nausea and feeling of pit in her stomach yesterday around the time of some of her nebulizer treatments and with the need for increased oxygen. She says that Tums and the ODT Zofran has helped with her nausea. She admits that she had one large bowel movement yesterday which seemed to help some with the nausea but prior to this had not had a BM in 2-3 days. She is ready to get home. Her brother has plans to visit today or tomorrow to bring her clothes. She would love to be able to shower because she has not been able to wash her hair for 3 days.           No updates to past / social / family history    Scheduled Meds:   acetaminophen  650 mg Oral Q6H    arformoterol  15 mcg Nebulization BID (RT)    azithromycin  250 mg Oral Daily    budesonide  0.5 mg Nebulization BID (RT)    diclofenac sodium  2 g Topical QID    enoxaparin (LOVENOX) injection  40 mg Subcutaneous Nightly    flu vacc [id number] 6mos up(PF)  0.5 mL Intramuscular During hospitalization    [Provider Hold] furosemide  80 mg Oral BID    gabapentin  100 mg Oral TID    iloprost  5 mcg Nebulization 6XD    insulin glargine  12 Units Subcutaneous Nightly    insulin lispro  0-20 Units Subcutaneous ACHS    insulin lispro  3 Units Subcutaneous 3xd Meals    ipratropium-albuterol  3 mL Nebulization TID (RT)    lidocaine  1 patch Transdermal Daily    magnesium oxide-Mg AA chelate  2 tablet Oral BID    posaconazole  200 mg Oral Daily    sildenafiL (pulm.hypertension)  40 mg Oral TID    sodium chloride  4 mL Nebulization BID (RT)     Continuous Infusions:  PRN Meds:.albuterol, aluminum-magnesium hydroxide-simethicone, calcium carbonate, dextrose in water, glucagon, melatonin, [EXPIRED] Implement **AND** [EXPIRED] Care order/instruction **AND** [EXPIRED] Vital signs **AND** Adult Oxygen therapy **AND** [EXPIRED] sodium chloride **AND** [EXPIRED] sodium chloride 0.9% **AND** [EXPIRED] diphenhydrAMINE **AND** [EXPIRED] famotidine **AND** methylPREDNISolone sodium succinate **AND** [EXPIRED] EPINEPHrine, ondansetron **OR** ondansetron, polyethylene glycol, prochlorperazine, senna, sodium chloride     Objective:       Function:  60% - Ambulation: Reduced / unable to do hobby or some housework, significant disease / Self-Care:Occasional assist as necessary / Intake:Normal or reduced / Level of Conscious: Full or confusion    Temp:  [36.3 ??C (97.4 ??F)-36.5 ??C (97.7 ??F)] 36.4 ??C (97.6 ??F)  Heart Rate:  [72-90] 72  SpO2 Pulse:  [74-87] 78  Resp:  [14-27] 14  BP: (82-107)/(61-72) 82/63  FiO2 (%):  [50 %] 50 %  SpO2:  [84 %-100 %] 95 %    Physical Exam:  Constitutional: sitting up in bed leaning forward in NAD   Eyes: anicteric sclera, no discharge  ENMT: moist oral mucosa  Pulm: breathing comfortably on 10 L large bore    Skin: dry and intact   Neuro: cognitive status normal    Psych: mood and affect - down but hopeful     Test Results:  Lab Results   Component Value Date    WBC 2.8 (L) 12/28/2022    RBC 4.02 12/28/2022    HGB 11.0 (L) 12/28/2022    HCT 33.7 (L) 12/28/2022    MCV 83.8 12/28/2022    MCH 27.5 12/28/2022    MCHC 32.8 12/28/2022    RDW 17.1 (H) 12/28/2022    PLT 145 (L) 12/28/2022    MPV 9.0 12/28/2022     Lab Results   Component Value Date    NA 136 12/28/2022    K 5.0 (H) 12/28/2022    CL 98 12/28/2022    CO2 31.0 12/28/2022    BUN 41 (H) 12/28/2022    CREATININE 2.62 (H) 12/28/2022    GFR >= 60 05/31/2011    GLU 143 12/28/2022    CALCIUM 9.9 12/28/2022    ALBUMIN 3.6 12/25/2022    PHOS 5.3 (H) 12/25/2022      Lab Results   Component Value Date    ALKPHOS 91 12/25/2022    BILITOT 0.7 12/25/2022    BILIDIR <0.10 05/30/2019    PROT 7.3 12/25/2022    ALBUMIN 3.6 12/25/2022    ALT 20 12/25/2022    AST 27 12/25/2022         I personally spent  25 minutes on the floor or unit in direct patient care. The direct patient care time included face-to-face time with the patient, reviewing the patient's chart, communicating with the family and/or other professionals and coordinating care.   Start time - stop time if >60 minutes:    Greater than 50% of this time spent on counseling/coordination of care:  Yes.   See ACP Note from today for additional billable service:  No.      Carolanne Grumbling, MD, [facility]  [facility] Internal Medicine PGY-3  Pager: [phone]    I personally spent 30 minutes face-to-face and non-face-to-face in the care of  this patient on December 29, 2022 , which includes all pre, intra, and post visit time on the date of service.  All documented time was specific to the E/M visit and does not include any procedures that may have been performed.  I saw and evaluated the patient, participating in the key portions of the service.  I reviewed and edited the resident???s note.  I agree with the resident???s findings and plan. Keturah Shavers, MD

## 2022-12-29 NOTE — Unmapped (Signed)
Infectious Disease ([facility]) Progress Note    Assessment & Plan:   Karen Barber is a 57 y.o. female whose presentation is complicated by bronchiectasis type 1 in the setting of pulmonary sarcoidosis (6L [place] home O2), HTN, T2DM, COPD, s/p LUL lobectomy, and asthma that presented to [facility] admitted to MICU after becoming hypoxic after holding home meds prior to PET scan. After 1 week diuresing ICU stabilized patient she was transferred to the MPCU.    Principal Problem:    Acute on chronic hypoxic respiratory failure (CMS-HCC)  Active Problems:    Pulmonary sarcoidosis (CMS-HCC)    Type 2 diabetes mellitus with hyperglycemia (CMS-HCC)    COPD (chronic obstructive pulmonary disease) (CMS-HCC)    Chronic pulmonary aspergillosis (CMS-HCC)    Pulmonary hypertension (CMS-HCC)    AKI (acute kidney injury) (CMS-HCC)    Active Problems  #Acute on Chronic Hypoxemic Respiratory Failure -   #Pulmonary Sarcoidosis -   #COPD/asthma -   #Bronchiectasis  Patient has notable history of pulmonary sarcoidosis, COPD, pulmonary hypertension and is on baseline oxygen 6 L.  Recent admission with pneumonia growing Serratia marcescens  s/p 1x Rocephin (8/18), Vancomycin (8/18-8/19), Zosyn (8/18-8/23) discharged on levofloxacin 500 mg ending on 8/31.  Patient held her medications only for the morning prior to an outpatient PET CT scan during which she became hypoxic to the 70s and was admitted to the MICU.  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 She received one dose of 125mg  of IV methylprednisolone. She was weaned from high flow nasal cannula to large bore nasal cannula after IV diuresis and a course of antibiotics for possible pneumonia with Vanco/cefepime later transition to levofloxacin with a total 1 week treatment with antibiotics. Started on vanc/cefepime for broad coverage --> stopped vanc and cefepime 9/12- then levaquin (9/12---9/16).  Patient was transferred to the floor on 9/18 after showing significant signs of improvement but still having occasional desaturations with ambulation. PET scan 9/18 is not indicative of active pulmonary sarcoidosis.  Acute exacerbation likely multifactorial including in not taking her home medications as per directed for airway clearance, volume overload, and higher O2 requirement chronically than the amount she has been using. RHC 9/20 showing significant pulm HTN disease with mean PA pressure of 47, for which pulmonology team started Iloprost. Notably, wedge pressure on that study was 5, which is reassuring against volume as driver of her respiratory status at that time. Patient has been having variations in oxygen requirement.  Nephrology considering RHC, however pulmonology is against.  Continuing IV diuresis with goal of net -500 mL.  -- Continue to wean O2 with goal saturations of 90-94% per pulmonology  -- Trialing PO Lasix 80 per pulmonology  -- Consulted palliative for goals of care conversation as well as symptom management.  -- Plan to get oxymizer for her while admitted. Will need oxymizer at home  -- Continue home airway clearance (DuoNebs and HTS 3 times daily with [place])  -- Continue home Pulmicort, Brovana, Atrovent, as needed albuterol, and azithromycin  -- PT and OT consult  -- Standing weights daily     #Pulmonary hypertension - RV failure - orthostatic hypotension  Patient has history of pulmonary hypertension due to pulmonary sarcoidosis/COPD.  TTE this admission 9/17 with severely dilated right atrium with severe pulmonary hypertension PASP 86 mmHg.  Estimated right atrial pressure 5 to 10 mmHg.  On Lasix 80 mg twice daily at home.  Echo showed LVEF greater than 55% and severe pulmonary hypertension no significant change  since prior study.  Right heart cath showed severe pulmonary Hypertension, markedly elevated PVR (9.3 - 10.5) and low cardiac output / index. HRCT scan shows unchanged fibrotic pulmonary sarcoidosis with bilateral segmental and subsegmental fibrosis post left upper lobectomy with unchanged apical right upper lobe and superior segment left lower lobe bullae.   -- Continue inhaled iloprost 5 mcg 6 times a day q4h; please do not mix with other nebulizer solutions   -- Plan to transition to Tyvaso nebs, working with pharmacy and [facility] nurse coordinator  -- [facility] rep to do teaching prior to DC    #Acute kidney injury on CKD3 (downtrending)  On admission patient's creatinine 1.3 which appears to be baseline. Cr fluctuating in 1.5-1.8 range.  -- Renally dose medications  -- Daily BMP    #Secondary Hyperparathyroidism, Hypocalcemia, and Hyperphosphatemia  Findings are consistent with secondary hyperparathyroidism due to CKD.  Ionized calcium: 4.37 mg/dL (low)  Phosphate: 5.6 mg/dL (elevated)  PTH: 098.1 pg/mL (elevated)  Vitamin D Total (25OH) 35  -- Reconsider need for future oral calcium carbonate 1600 mg TID  -- Reconsider need for future phoslo 667 mg PO TID   -- Reassess PTH levels in 4-6 weeks.    #Type 2 diabetes mellitus with hyperglycemia and hypoglycemia  Hemoglobin A1c 8.3%.  Home regimen includes glargine 18 units nightly and sliding scale insulin.  Large fluctuations in glucose greater than 300 and occasional values ~50.  -- Sliding scale insulin insulin sensitivity factor 40  --12 glargine nightly  -- 3 units lispro tid     #Left leg swelling (resolved )  Patient endorses intermittent left > right leg swelling. Venous doppler unremarkable.      Chronic Problems     #Chronic Pain   - Resuming gabapentin, though at reduced dose  - PT/OT consulted, appreciate recs     #Chronic pulmonary aspergillosis  Patient has history of aspergilloma and is on chronic posaconazole. Soluble IL-2 receptor, Aspergillus Galactomannan Antigen, IgE checked 10/2022 all wnl  -Continue home posaconazole Ppx  -Outpatient evaluation for [facility] scheduled for 10/25 to evaluate recurrent hemoptysis     #Iron deficiency anemia  #Chronic normocytic anemia (baseline hemoglobin 11)  Patient has some mild normocytic anemia with hemoglobin of 11. Iron panel shows iron of 37 with iron saturation of 15.   --Iron supplementation with iron dextran 1,000mg  in 250cc    #Hyperlipidemia  Last lipid profile showed an LDL of 146  -- Follow-up outpatient    Issues Impacting Complexity of Management:  -The patient is at high risk for the development of complications of volume overload due to the need to provide IV hydration for suspected hypovolemia in the setting of: Pulmonary hypertension  -Need for the following intensive monitoring parameter(s) due to high risk of clinical decline: continuous oxygen monitoring, telemetry, and frequent monitoring of urine output to assess for the efficacy of diuretic regimen  -Need for intensive oxygen therapy of HFNC, which places the patient at high risk for oxygen toxicity  -The patient is at high risk of complications from pulmonary hypertension    Daily Checklist:  Diet: Regular Diet   DVT PPx: Lovenox 40mg  q24h  Electrolytes: Replete Potassium to >/= 3.6 and Magnesium to >/= 1.8  Code Status: DNR and DNI  Dispo:  Continue MPCU    Team Contact Information:   Primary Team: Infectious Disease ([facility])  Primary Resident: Zella Richer, MD  Resident's Pager: [phone] (Infect Disease Intern - Tower)    Interval History:  No acute events overnight.     Patient reports no concerns or complaints this morning.  Continues to endorse improvement in GI symptoms as well as lower extremity paresthesias.  Feels that her shortness of breath is stable, denying any worsening of her shortness of breath, fevers, chills, body aches, or new abdominal pain.  Discussed the patient goal of continued diuresis as well as working with case Estate agent to acquire the Tyvaso.    ROS negative except for where otherwise noted above.    Objective:   Temp:  [36.3 ??C (97.4 ??F)-36.5 ??C (97.7 ??F)] 36.4 ??C (97.6 ??F)  Heart Rate:  [74-90] 80  SpO2 Pulse:  [74-87] 81  Resp:  [15-22] 22  BP: (84-107)/(61-72) 84/63  FiO2 (%):  [50 %] 50 %  SpO2:  [90 %-100 %] 90 %,   Intake/Output Summary (Last 24 hours) at 12/29/2022 0906  Last data filed at 12/29/2022 0415  Gross per 24 hour   Intake 520 ml   Output 1350 ml   Net -830 ml   , FiO2 (%):  [50 %] 50 %  O2 Device: Large bore nasal cannula (cool high flow)  O2 Flow Rate (L/min):  [8 L/min] 8 L/min    Gen: NAD, converses well  HENT: Atraumatic, normocephalic  Heart: RRR  Lungs: Decreased breath sounds bilaterally, no adventitious lung sounds, no increased work of breathing  Abdomen: Soft, nontender nondistended  Extremities: Stable lower extremity pedal edema

## 2022-12-29 NOTE — Unmapped (Signed)
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  On 10/7 began rising after diuresis of 2.7 L of urine on 10/6, and creatinine up to 2.95 at the time of consult.  DDx of prerenal from relative volume depletion, cardiorenal given narrow threshold for forward flow, congestion though suspect this is not the case in the setting of good urine output and rising CO2 despite BNP's rising over the last few days.  Volume status exam is extremely challenging in the setting of her pulmonary hypertension but fortunately has stabilized and improved after gentle diuresis yesterday favoring cardiorenal congestive AKI rather than the opposite with improvement in her BNP. This reinforces a very narrow window of euvolemia  - Sediment 10/9 bland  - if continues to fluctuate in O2 requirements and renal function, may consider repeat right heart cath to get a sense of PA pressures and filling pressures given hypoxemia increasing? appreciate pulmonary recommendations regarding this  - Would continue one additional day of lasix 80mg  IV bid today for gently net neg 500cc - can transition to PO tomorrow with lasix 80 PO bid  For now which would manage as ATN with maintenance of MAP greater than 65    # Pulmonary sarcoidosis complicated by pulmonary hypertension on chronic oxygen  - Evaluation and management per primary team  - No changes to management from a nephrology standpoint at this time    RECOMMENDATIONS:   - if continues to fluctuate in O2 requirements and renal function, may consider repeat right heart cath to get a sense of PA pressures and filling pressures given hypoxemia increasing? appreciate pulmonary recommendations regarding this  - agree with lasix 80mg  IV bid today for gently net neg 500cc  - We will continue to follow.     [name], MD  12/29/2022 10:35 AM     Medical decision-making for 12/29/22  Findings / Data     Patient has: []  acute illness w/systemic sxs  [mod]  []  two or more stable chronic illnesses [mod]  []  one chronic illness with acute exacerbation [mod]  []  acute complicated illness  [mod]  []  Undiagnosed new problem with uncertain prognosis  [mod] [x]  illness posing risk to life or bodily function (ex. AKI)  [high]  []  chronic illness with severe exacerbation/progression  [high]  []  chronic illness with severe side effects of treatment  [high] AKI Probs At least 2:  Probs, Data, Risk   I reviewed: [x]  primary team note  [x]  consultant note(s)  []  external records [x]  chemistry results  []  CBC results  []  blood gas results  []  Other []  procedure/op note(s)   []  radiology report(s)  []  micro result(s)  []  w/ independent historian(s) Pulm notes reviewed, Cr 2.6 stable; diuresing >=3 Data Review (2 of 3)    I independently interpreted: []  Urine Sediment  []  Renal US []  CXR Images  []  CT Images  []  Other []  EKG Tracing  Any     I discussed: []  Pathology results w/ QHPs(s) from  other specialties  []  Procedural findings w/ QHPs(s) from other specialties []  Imaging w/ QHP(s) from other specialties  [x]  Treatment plan w/ QHP(s) from other specialties Plan discussed with primary team Any     Mgm't requires: []  Prescription drug(s)  [mod]  []  Kidney biopsy  [mod]  []  Central line placement  [mod] []  High risk medication use and/or intensive toxicity monitoring [high]  []  Renal replacement therapy [high]  []  High risk kidney biopsy  [high]  []  Escalation of care  [high]  []  High risk central line placement  [high] IV Diuresis: check BMP/Mg  Risk      ____________________________________________________  Interval Hx  Feeling well, O2 down to 6L; no swelling but does note some nausea and would like to try ensure    History of Present Illness: Karen Barber is 57 y.o. 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 Nephrology has been consulted for AKI.     Patient was initially admitted 9/11 when she presented for a PET scan to evaluate her sarcoidosis but unfortunately was found to be hypoxemic on her home 6 L of nasal cannula requiring high flow nasal cannula and was admitted initially to the ICU.  She was diuresed and her oxygenation improved and she was stepped down to the [facility].  She was treated with antibiotics as well as steroids for infection and bronchiectasis.  She underwent right heart cath on 9/20 with elevated mean PA pressures and was started on iloprost.  Notes that she has not been told she has kidney issues prior to this as she regularly follows up with a PCP.  She notes that she is not eating or drinking very well as she is worried about accumulating fluid whenever she eats salt and she does not like the food here.  Denies nausea.    Lasix dosing over the last few days has included 80 mg IV once or twice twice a day till 10/6, with diuresis holiday 10/7 and then given 60 mg IV Lasix on 10/8.    Patient does note that she is urinating less over the last few days and has to push a little bit to empty her bladder at the end.    Of note O2 requirement is gradually increasing from baseline of 6 L nasal cannula on 10/6 and 10/7 up to 9 and up to 10 L on day of consult.  Urine output of 300 up to 700 today.  Curiously proBNP has been rising the last few days though team has felt she is relatively euvolemic during this time.    Creatinine recently has steady down in the 1.6-1.7 range however starting 10/7 rising up and up to 2.95 on day of consult    INPATIENT MEDICATIONS:    Current Facility-Administered Medications:     acetaminophen (TYLENOL) tablet 650 mg, Oral, Q6H    albuterol 2.5 mg /3 mL (0.083 %) nebulizer solution 2.5 mg, Nebulization, Q6H PRN    aluminum-magnesium hydroxide-simethicone (MAALOX MAX) 80-80-8 mg/mL oral suspension, Oral, Q6H PRN    arformoterol (BROVANA) nebulizer solution 15 mcg/2 mL, Nebulization, BID (RT)    azithromycin (ZITHROMAX) tablet 250 mg, Oral, Daily    budesonide (PULMICORT) nebulizer solution 0.5 mg, Nebulization, BID (RT)  calcium carbonate (TUMS) chewable tablet 200 mg elem calcium, Oral, TID PRN    dextrose (D10W) 10% bolus 125 mL, Intravenous, Q10 Min PRN    diclofenac sodium (VOLTAREN) 1 % gel 2 g, Topical, QID    enoxaparin (LOVENOX) syringe 40 mg, Subcutaneous, Nightly    flu vaccine TS 2024-25(29mos up)(PF)(FLULAVAL, FLUARIX, FLUZONE), Intramuscular, During hospitalization    [Provider Hold] furosemide (LASIX) tablet 80 mg, Oral, BID    gabapentin (NEURONTIN) capsule 100 mg, Oral, TID    glucagon injection 1 mg, Intramuscular, Once PRN    iloprost (VENTAVIS) nebulizer solution, Nebulization, 6XD    insulin glargine (LANTUS) injection 12 Units, Subcutaneous, Nightly    insulin lispro (HumaLOG) injection 0-20 Units, Subcutaneous, ACHS    insulin lispro (HumaLOG) injection 3 Units, Subcutaneous, 3xd Meals    ipratropium-albuterol (DUO-NEB) 0.5-2.5 mg/3 mL nebulizer solution 3 mL, Nebulization, TID (RT)    lidocaine (ASPERCREME) 4 % 1 patch, Transdermal, Daily    magnesium oxide-Mg AA chelate (Magnesium Plus Protein) 2 tablet, Oral, BID    melatonin tablet 6 mg, Oral, Nightly PRN    [EXPIRED] Implement, , Until Discontinued **AND** [EXPIRED] Care order/instruction, , PRN **AND** [EXPIRED] Vital signs, , PRN **AND** Adult Oxygen therapy, , PRN **AND** [EXPIRED] sodium chloride (NS) 0.9 % infusion, Intravenous, Continuous PRN **AND** [EXPIRED] sodium chloride 0.9% (NS) bolus 1,000 mL, Intravenous, Daily PRN **AND** [EXPIRED] diphenhydrAMINE (BENADRYL) injection 25 mg, Intravenous, Q4H PRN **AND** [EXPIRED] famotidine (PEPCID) injection 20 mg, Intravenous, Q4H PRN **AND** methylPREDNISolone sodium succinate (PF) (SOLU-Medrol) injection 125 mg, Intravenous, Q4H PRN **AND** [EXPIRED] EPINEPHrine (EPIPEN) injection 0.3 mg, Intramuscular, Daily PRN    ondansetron (ZOFRAN-ODT) disintegrating tablet 4 mg, Oral, Q8H PRN **OR** ondansetron (ZOFRAN) injection 4 mg, Intravenous, Q8H PRN    polyethylene glycol (MIRALAX) packet 17 g, Oral, Daily PRN    posaconazole (NOXAFIL) delayed released tablet 200 mg, Oral, Daily    prochlorperazine (COMPAZINE) tablet 5 mg, Oral, Q6H PRN    senna (SENOKOT) tablet 2 tablet, Oral, Nightly PRN    sildenafiL (pulm.hypertension) (REVATIO) tablet 40 mg, Oral, TID    sodium chloride (OCEAN) 0.65 % nasal spray 1 spray, Each Nare, Q6H PRN    sodium chloride 3 % NEBULIZER solution 4 mL, Nebulization, BID (RT)    OUTPATIENT MEDICATIONS:  Prior to Admission medications    Medication Dose, Route, Frequency   ACCU-CHEK GUIDE GLUCOSE METER Misc 1 Device, Other, 4 times a day ([facility]), AS DIRECTED   albuterol 2.5 mg /3 mL (0.083 %) nebulizer solution 2.5 mg, Nebulization, Every 6 hours PRN   albuterol HFA 90 mcg/actuation inhaler 2 puffs, Inhalation, Every 4 hours PRN   arformoterol (BROVANA) 15 mcg/2 mL 15 mcg, Nebulization, 2 times daily (RT)   aspirin-caffeine (BAYER BACK AND BODY) 500-32.5 mg Tab Oral, Takes 2 1/2 pills 3-4 times a day as needed.   azithromycin (ZITHROMAX) 250 MG tablet 250 mg, Oral, Every 24 hours, Start on 9/1 after completing the levofloxacin   blood sugar diagnostic Strp Other, 3 times a day (standard), ACCU-CHEK Guide meter   budesonide (PULMICORT) 0.5 mg/2 mL nebulizer solution 0.5 mg, Nebulization, 2 times daily (RT)   furosemide (LASIX) 80 MG tablet 80 mg, Oral, 2 times a day (standard), Take a third dose as needed for increasing weight or increasing swelling.   gabapentin (NEURONTIN) 300 MG capsule 300 mg, Oral, 3 times a day (standard)   insulin aspart (NOVOLOG U-100 INSULIN ASPART) 100 unit/mL vial 1-20 Units, Subcutaneous, 4 times a day ([facility]), SLIDING SCALE  insulin glargine (BASAGLAR KWIKPEN U-100 INSULIN) 100 unit/mL (3 mL) injection pen 18 Units, Subcutaneous, Nightly   ipratropium (ATROVENT) 0.02 % nebulizer solution 500 mcg, Nebulization, 4 times a day   posaconazole (NOXAFIL) 100 mg delayed released tablet 200 mg, Oral, Daily (standard)   sildenafiL, pulm.hypertension, (REVATIO) 20 mg tablet 20 mg, Oral, 3 times a day (standard)   OXYGEN-AIR DELIVERY SYSTEMS MISC Dose: 2-3 LPM        ALLERGIES:  Patient has no known allergies.    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 A.niger from sputum 08/2016-09/2016.    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    Sarcoidosis 1995    Type 2 diabetes mellitus with hyperglycemia (CMS-HCC) 07/27/2014    Visual impairment     glasses     Past Surgical History:   Procedure Laterality Date    LUNG LOBECTOMY      PR AMPUTATION TOE,MT-P JT Right 12/12/2019 Procedure: Right foot fifth toe amputation at the metatarsophalangeal joint level;  Surgeon: Britt Bottom, DPM;  Location: MAIN OR [facility];  Service: Vascular    PR AMPUTATION TOE,MT-P JT Right 06/16/2020    Procedure: Right foot amputation of 3rd toe at mpj level;  Surgeon: Britt Bottom, DPM;  Location: MAIN OR [facility];  Service: Vascular    PR RIGHT HEART CATH O2 SATURATION & CARDIAC OUTPUT N/A 04/29/2019    Procedure: Right Heart Catheterization;  Surgeon: Rosana Hoes, MD;  Location: [facility] CATH;  Service: Cardiology    PR RIGHT HEART CATH O2 SATURATION & CARDIAC OUTPUT N/A 12/08/2022    Procedure: Right Heart Catheterization;  Surgeon: Neal Dy, MD;  Location: [facility] CATH;  Service: Cardiology    PR THORACOSCOPY SURG TOT PULM DECORT Left 12/14/2014    Procedure: THORACOSCOPY SURG; W/TOT PULM DECORTIC/PNEUMOLYS;  Surgeon: Evert Kohl, MD;  Location: MAIN OR [facility];  Service: Cardiothoracic    PR THORACOSCOPY W/THERA WEDGE RESEXN INITIAL UNILAT Left 12/03/2014    Procedure: THORACOSCOPY, SURGICAL; WITH THERAPEUTIC WEDGE RESECTION (EG, MASS, NODULE) INITIAL UNILATERAL;  Surgeon: Evert Kohl, MD;  Location: MAIN [facility];  Service: Cardiothoracic     SOCIAL HISTORY  Social History     Social History Narrative    Not on file      reports that she quit smoking about 35 years ago. Her smoking use included cigarettes. She started smoking about 44 years ago. She has a 4.5 pack-year smoking history. She has never used smokeless tobacco. She reports that she does not drink alcohol and does not use drugs.   FAMILY HISTORY  Family History   Problem Relation Age of Onset    Dementia Mother     Diabetes Father  Diabetes Brother     Diabetes Maternal Aunt     Sarcoidosis Maternal Aunt     Diabetes Maternal Uncle     Diabetes Paternal Aunt     Diabetes Paternal Uncle         Physical Exam:   Vitals:    12/29/22 0757 12/29/22 0910 12/29/22 1005 12/29/22 1015   BP: 82/63      Pulse: 76 78 72 72   Resp: 27 18 20 14    Temp: 36.4 ??C (97.6 ??F)      TempSrc: Oral      SpO2: 90% (!) 84% 99% 95%   Weight:       Height:         No intake/output data recorded.    Intake/Output Summary (Last 24 hours) at 12/29/2022 1035  Last data filed at 12/29/2022 0415  Gross per 24 hour   Intake 520 ml   Output 1350 ml   Net -830 ml     Constitutional: well-appearing, no acute distress sitting up in bed  Heart: RRR, no m/r/g  Lungs: fine crackles in bases  Abd: non-distended  Ext: No significant edema

## 2022-12-29 NOTE — Unmapped (Signed)
Pt alert and oriented x 4. BP soft but MAP>65. Other VSS. Pt on 8L LBNC though went on 50% VM for part of the night due to discomfort in her nose. Pt c/o intermittent nausea, finds maalox to work well. Pt uses call bell appropriately. Pt resting at this time. Will continue to monitor.      Problem: Adult Inpatient Plan of Care  Goal: Plan of Care Review  Outcome: Progressing  Goal: Patient-Specific Goal (Individualized)  Outcome: Progressing  Goal: Absence of Hospital-Acquired Illness or Injury  Outcome: Progressing  Intervention: Identify and Manage Fall Risk  Recent Flowsheet Documentation  Taken 12/28/2022 2000 by Heidi Dach, RN  Safety Interventions:   environmental modification   fall reduction program maintained   lighting adjusted for tasks/safety   low bed  Goal: Optimal Comfort and Wellbeing  Outcome: Progressing  Goal: Readiness for Transition of Care  Outcome: Progressing  Goal: Rounds/Family Conference  Outcome: Progressing     Problem: Fall Injury Risk  Goal: Absence of Fall and Fall-Related Injury  Outcome: Progressing  Intervention: Promote Scientist, clinical (histocompatibility and immunogenetics) Documentation  Taken 12/28/2022 2000 by Heidi Dach, RN  Safety Interventions:   environmental modification   fall reduction program maintained   lighting adjusted for tasks/safety   low bed     Problem: Gas Exchange Impaired  Goal: Optimal Gas Exchange  Outcome: Progressing     Problem: Comorbidity Management  Goal: Blood Glucose Levels Within Targeted Range  Outcome: Progressing  Goal: Blood Pressure in Desired Range  Outcome: Progressing     Problem: Skin Injury Risk Increased  Goal: Skin Health and Integrity  Outcome: Progressing

## 2022-12-29 NOTE — Unmapped (Signed)
Pulmonary Consult Service  Follow-Up Note      Primary Service:   Medicine - MedK  Primary Service Attending:  Ebony Hail, MD  Reason for Consult:   Assistance with Diuresis, Pulmonary HTN    ASSESSMENT and PLAN     AMIYLAH Barber is a 57 y.o. 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 Pt was found to have acute on chronic respiratory failure (sat in 70s on home 6L) in the setting of not taking her home medications (sildenafil, lasix, nebs, inhalers) in preparation for PET scan, admitted 9/11. She was admitted to the ICU initially, and weaned back to 6-8L   with airway clearance and diuresis and transitioned to [facility]PCU. Her acute on chronic hypoxemia and increase O2 requirement was likely multifactorial -- combination of volume overload as well as lack of airway clearance at home, however I think chronically her O2 requirement (related to her PH) is higher than the amount she has been on at home and she continues to have severe exertional hypoxemia probably due to her parenchymal disease, at least in part.       Continues to have some challenging managing volume status and renal function, but renal function seems to improve with gentle diuresis aiming for net -500 cc.      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 She is planned to continue Tyvaso at discharge. Her oxygen requirements are still 8-10L.   AKI at this time is mostly due to subtle volume overload with poor cardiac output & decreased renal blood flow. Cr is improving with diuresis per primary team and nephrology. Low threshold for inotropic support if needed -- although overall her exam & her functional improvement since starting iloprost are reassuring.   Repeat limited echo for evaluation of RV size and function shows moderately reduced systolic function compared to mildly reduced systolic function from echo 9/17. IVC size and inspiratory change suggest increased R atrial pressure (10-20 mm Hg) when compared to previous (5-10 mm Hg). Estimated PASP 84 down from 86. TR max velocity 4.1 down from 4.4.     Plan:  - Appreciate nephrology assistance with diuresis -will tentatively plan to transition to oral diuretic tomorrow pending renal function/oxygenation  - Tyvaso teaching will take place when there is a confirmed discharge date. Patient does not currently have delivery device.  - Would defer RHC as this time as not really likely to change management regarding tyvaso (ie if worse would continue, if better would also continue). She is WWP, no elevated lactate to suggest cardiogenic shock.   - Continue inhaled iloprost 5 mcg q4h. Tyvaso at discharge.   - Continue sildenafil 40 mg TID  - Continue daily weight with strict I/O  - cont daily pro-BNP      2. Acute on Chronic Hypoxemic Respiratory Failure  COPD/Asthma  Pulmonary Sarcoidosis c/b Bronchiectasis  Soluble IL-2 receptor, Aspergillus Galactomannan Antigen, IgE checked 10/2022 all wnl. PET scan 9/17 does not appear to be active pulmonary sarcoidosis and again it is overall felt to be burned out from sarcoidosis standpoint. She is now stable satting at >95% on 8-10L via [facility].  It is likely she has higher O2 requirement chronically than the amount she has been using at home. This may reflect her new baseline and this was discussed with the patient. Discussion had with the patient about more realistic oxygen sats for her and limits on activity at home to prevent desats.      Plan:  - Set O2 sat goal at 90-94%, wean oxygen as tolerated  - continue home pulmicort, brovana, PRN albuterol, azithromycin  - please cont airway clearance:              - duonebs TID and HTS (3%) BID with aerobika      3. Chronic Pulmonary Aspergillosis   Aspergillus Galactomannan Antigen, IgE checked 10/2022 all wnl.   - continue home posaconazole   - pt has outpt evaluation for [facility] scheduled for 10/25 for history of recurrent hemoptysis    Ms. Affinito was seen, examined and discussed with  Dr. Jhonnie Garner. Lurena Nida   Thank you for involving [place] in her care. We look forward to following with you.  Please don't hesitate to page [place] with any questions or concerns at [phone] (pulmonary consult fellow).    [employer]     I attest that I have reviewed the student note and that the components of the history of the present illness, the physical exam, and the assessment and plan documented were performed by me or were performed in my presence by the student where I verified the documentation and performed (or re-performed) the exam and medical decision making.   Kandis Fantasia, MD      SUBJECTIVE:     See prior notes for HPI.    No acute concerns this morning. She says that occasionally she will use NRB when her nose gets too dry/senstive from using nasal cannula. Denies new SOB, CP.      Medications:     Scheduled Meds:   acetaminophen  650 mg Oral Q6H    arformoterol  15 mcg Nebulization BID (RT)    azithromycin  250 mg Oral Daily    budesonide  0.5 mg Nebulization BID (RT)    diclofenac sodium  2 g Topical QID    enoxaparin (LOVENOX) injection  40 mg Subcutaneous Nightly    flu vacc [id number] 6mos up(PF)  0.5 mL Intramuscular During hospitalization    [Provider Hold] furosemide  80 mg Oral BID    gabapentin  100 mg Oral TID    iloprost  5 mcg Nebulization 6XD    insulin glargine  12 Units Subcutaneous Nightly    insulin lispro  0-20 Units Subcutaneous ACHS    insulin lispro  3 Units Subcutaneous 3xd Meals    ipratropium-albuterol  3 mL Nebulization TID (RT)    lidocaine  1 patch Transdermal Daily    magnesium oxide-Mg AA chelate  2 tablet Oral BID    posaconazole  200 mg Oral Daily    sildenafiL (pulm.hypertension)  40 mg Oral TID    sodium chloride  4 mL Nebulization BID (RT)     Continuous Infusions:  PRN Meds:.albuterol, aluminum-magnesium hydroxide-simethicone, calcium carbonate, dextrose in water, glucagon, melatonin, [EXPIRED] Implement **AND** [EXPIRED] Care order/instruction **AND** [EXPIRED] Vital signs **AND** Adult Oxygen therapy **AND** [EXPIRED] sodium chloride **AND** [EXPIRED] sodium chloride 0.9% **AND** [EXPIRED] diphenhydrAMINE **AND** [EXPIRED] famotidine **AND** methylPREDNISolone sodium succinate **AND** [EXPIRED] EPINEPHrine, ondansetron **OR** ondansetron, polyethylene glycol, prochlorperazine, senna, sodium chloride     PHYSICAL EXAM:   BP 91/55  - Pulse  73  - Temp 36.9 ??C (98.5 ??F) (Oral)  - Resp 27  - Ht 180.3 cm (5' 11)  - Wt 86.6 kg (190 lb 14.7 oz)  - LMP  (LMP Unknown)  - SpO2 100%  - BMI 26.63 kg/m??   General: Alert, well-appearing, and in no distress.  Eyes: Anicteric sclera, conjunctiva clear.  ENT:   no drainage.  Cardiovascular: Regular rate and rhythm.  Pulmonary: Diminished lung sounds in bilateral upper lobes, crackles bilateral bases  Musculoskeletal: No clubbing. trace LE edema improved from prior exams  Skin: no lesions noted on clothed exam  Neuro: No focal neurological deficits.    LABORATORY and RADIOLOGY DATA:     Pertinent Laboratory Data from the last 24 hours:  Lab Results   Component Value Date    WBC 2.8 (L) 12/29/2022    HGB 11.5 12/29/2022    HCT 34.6 12/29/2022    PLT 145 (L) 12/29/2022     Lab Results   Component Value Date    NA 139 12/29/2022    K 4.9 (H) 12/29/2022    CL 97 (L) 12/29/2022    CO2 40.0 (H) 12/29/2022    BUN 56 (H) 12/29/2022    CREATININE 2.68 (H) 12/29/2022    GLU 124 12/29/2022    CALCIUM 9.8 12/29/2022    MG 2.7 (H) 12/29/2022    PHOS 5.3 (H) 12/25/2022       Lab Results   Component Value Date    BILITOT 0.7 12/25/2022    BILIDIR <0.10 05/30/2019    PROT 7.3 12/25/2022 ALBUMIN 3.6 12/25/2022    ALT 20 12/25/2022    AST 27 12/25/2022    ALKPHOS 91 12/25/2022    GGT 60 (H) 05/31/2011       Lab Results   Component Value Date    INR 1.02 12/11/2019    APTT 30.9 12/11/2019       Pertinent Micro Data:  Microbiology Results (last day)       ** No results found for the last 24 hours. **             Pertinent Imaging Data from the last 24 hours:  10/10 Limited Echo  Summary    1. The left ventricle is relatively small in size with normal wall  thickness.    2. The left ventricular systolic function is normal, LVEF is visually  estimated at 55-60%.    3. The aortic valve is trileaflet with moderately thickened leaflets with  normal excursion.    4. The right ventricle is severely dilated in size, with moderately reduced  systolic function.    5. There is moderate tricuspid regurgitation.    6. There is severe pulmonary hypertension.    7. The right atrium is severely dilated in size.    8. IVC size and inspiratory change suggest elevated right atrial pressure.  (10-20 mmHg).

## 2022-12-30 LAB — MAGNESIUM
MAGNESIUM: 3 mg/dL — ABNORMAL HIGH (ref 1.6–2.6)
MAGNESIUM: 2.9 mg/dL — ABNORMAL HIGH (ref 1.6–2.6)

## 2022-12-30 LAB — BASIC METABOLIC PANEL
ANION GAP: 5 mmol/L (ref 5–14)
ANION GAP: 2 mmol/L — ABNORMAL LOW (ref 5–14)
BLOOD UREA NITROGEN: 61 mg/dL — ABNORMAL HIGH (ref 9–23)
BLOOD UREA NITROGEN: 60 mg/dL — ABNORMAL HIGH (ref 9–23)
BUN / CREAT RATIO: 24
BUN / CREAT RATIO: 24
CALCIUM: 9.6 mg/dL (ref 8.7–10.4)
CALCIUM: 9.9 mg/dL (ref 8.7–10.4)
CHLORIDE: 95 mmol/L — ABNORMAL LOW (ref 98–107)
CHLORIDE: 98 mmol/L (ref 98–107)
CO2: 36 mmol/L — ABNORMAL HIGH (ref 20.0–31.0)
CO2: 39 mmol/L — ABNORMAL HIGH (ref 20.0–31.0)
CREATININE: 2.58 mg/dL — ABNORMAL HIGH
CREATININE: 2.49 mg/dL — ABNORMAL HIGH
EGFR CKD-EPI (2021) FEMALE: 21 mL/min/{1.73_m2} — ABNORMAL LOW (ref >=60)
EGFR CKD-EPI (2021) FEMALE: 22 mL/min/{1.73_m2} — ABNORMAL LOW (ref >=60)
GLUCOSE RANDOM: 204 mg/dL — ABNORMAL HIGH (ref 70–179)
GLUCOSE RANDOM: 125 mg/dL (ref 70–179)
POTASSIUM: 5.1 mmol/L — ABNORMAL HIGH (ref 3.4–4.8)
POTASSIUM: 5.3 mmol/L — ABNORMAL HIGH (ref 3.4–4.8)
SODIUM: 136 mmol/L (ref 135–145)
SODIUM: 139 mmol/L (ref 135–145)

## 2022-12-30 LAB — CBC
HEMATOCRIT: 34 % (ref 34.0–44.0)
HEMOGLOBIN: 11.1 g/dL — ABNORMAL LOW (ref 11.3–14.9)
MEAN CORPUSCULAR HEMOGLOBIN CONC: 32.6 g/dL (ref 32.0–36.0)
MEAN CORPUSCULAR HEMOGLOBIN: 27.7 pg (ref 25.9–32.4)
MEAN CORPUSCULAR VOLUME: 85 fL (ref 77.6–95.7)
MEAN PLATELET VOLUME: 8.6 fL (ref 6.8–10.7)
PLATELET COUNT: 145 10*9/L — ABNORMAL LOW (ref 150–450)
RED BLOOD CELL COUNT: 4 10*12/L (ref 3.95–5.13)
RED CELL DISTRIBUTION WIDTH: 18 % — ABNORMAL HIGH (ref 12.2–15.2)
WBC ADJUSTED: 3 10*9/L — ABNORMAL LOW (ref 3.6–11.2)

## 2022-12-30 LAB — PRO-BNP: PRO-BNP: 9713 pg/mL — ABNORMAL HIGH (ref <=300.0)

## 2022-12-30 LAB — LACTATE, VENOUS, WHOLE BLOOD: LACTATE BLOOD VENOUS: 2.3 mmol/L — ABNORMAL HIGH (ref 0.5–1.8)

## 2022-12-30 MED ADMIN — furosemide (LASIX) injection 80 mg: 80 mg | INTRAVENOUS | @ 14:00:00 | Stop: 2022-12-30

## 2022-12-30 MED ADMIN — magnesium oxide-Mg AA chelate (Magnesium Plus Protein) 2 tablet: 2 | ORAL | @ 13:00:00

## 2022-12-30 MED ADMIN — furosemide (LASIX) injection 80 mg: 80 mg | INTRAVENOUS | @ 23:00:00 | Stop: 2022-12-30

## 2022-12-30 MED ADMIN — albuterol 2.5 mg /3 mL (0.083 %) nebulizer solution 2.5 mg: 2.5 mg | RESPIRATORY_TRACT

## 2022-12-30 MED ADMIN — sodium chloride 3 % NEBULIZER solution 4 mL: 4 mL | RESPIRATORY_TRACT | @ 12:00:00 | Stop: 2022-12-30

## 2022-12-30 MED ADMIN — posaconazole (NOXAFIL) delayed released tablet 200 mg: 200 mg | ORAL | @ 13:00:00

## 2022-12-30 MED ADMIN — gabapentin (NEURONTIN) capsule 100 mg: 100 mg | ORAL | @ 01:00:00

## 2022-12-30 MED ADMIN — iloprost (VENTAVIS) nebulizer solution: 5 ug | RESPIRATORY_TRACT | @ 01:00:00

## 2022-12-30 MED ADMIN — budesonide (PULMICORT) nebulizer solution 0.5 mg: .5 mg | RESPIRATORY_TRACT

## 2022-12-30 MED ADMIN — arformoterol (BROVANA) nebulizer solution 15 mcg/2 mL: 15 ug | RESPIRATORY_TRACT | @ 12:00:00

## 2022-12-30 MED ADMIN — arformoterol (BROVANA) nebulizer solution 15 mcg/2 mL: 15 ug | RESPIRATORY_TRACT

## 2022-12-30 MED ADMIN — iloprost (VENTAVIS) nebulizer solution: 5 ug | RESPIRATORY_TRACT | @ 16:00:00

## 2022-12-30 MED ADMIN — acetaminophen (TYLENOL) tablet 650 mg: 650 mg | ORAL | @ 04:00:00

## 2022-12-30 MED ADMIN — insulin glargine (LANTUS) injection 12 Units: 12 [IU] | SUBCUTANEOUS | @ 01:00:00

## 2022-12-30 MED ADMIN — budesonide (PULMICORT) nebulizer solution 0.5 mg: .5 mg | RESPIRATORY_TRACT | @ 12:00:00

## 2022-12-30 MED ADMIN — gabapentin (NEURONTIN) capsule 100 mg: 100 mg | ORAL | @ 18:00:00

## 2022-12-30 MED ADMIN — ipratropium-albuterol (DUO-NEB) 0.5-2.5 mg/3 mL nebulizer solution 3 mL: 3 mL | RESPIRATORY_TRACT

## 2022-12-30 MED ADMIN — acetaminophen (TYLENOL) tablet 650 mg: 650 mg | ORAL | @ 18:00:00

## 2022-12-30 MED ADMIN — magnesium oxide-Mg AA chelate (Magnesium Plus Protein) 2 tablet: 2 | ORAL | @ 01:00:00

## 2022-12-30 MED ADMIN — ipratropium-albuterol (DUO-NEB) 0.5-2.5 mg/3 mL nebulizer solution 3 mL: 3 mL | RESPIRATORY_TRACT | @ 20:00:00

## 2022-12-30 MED ADMIN — insulin lispro (HumaLOG) injection 3 Units: 3 [IU] | SUBCUTANEOUS | @ 20:00:00

## 2022-12-30 MED ADMIN — gabapentin (NEURONTIN) capsule 100 mg: 100 mg | ORAL | @ 13:00:00

## 2022-12-30 MED ADMIN — iloprost (VENTAVIS) nebulizer solution: 5 ug | RESPIRATORY_TRACT | @ 23:00:00

## 2022-12-30 MED ADMIN — iloprost (VENTAVIS) nebulizer solution: 5 ug | RESPIRATORY_TRACT | @ 12:00:00

## 2022-12-30 MED ADMIN — acetaminophen (TYLENOL) tablet 650 mg: 650 mg | ORAL | @ 10:00:00

## 2022-12-30 MED ADMIN — sildenafiL (pulm.hypertension) (REVATIO) tablet 40 mg: 40 mg | ORAL | @ 13:00:00

## 2022-12-30 MED ADMIN — enoxaparin (LOVENOX) syringe 40 mg: 40 mg | SUBCUTANEOUS | @ 01:00:00

## 2022-12-30 MED ADMIN — sildenafiL (pulm.hypertension) (REVATIO) tablet 40 mg: 40 mg | ORAL | @ 18:00:00

## 2022-12-30 MED ADMIN — sildenafiL (pulm.hypertension) (REVATIO) tablet 40 mg: 40 mg | ORAL | @ 01:00:00

## 2022-12-30 MED ADMIN — sodium chloride 3 % NEBULIZER solution 4 mL: 4 mL | RESPIRATORY_TRACT

## 2022-12-30 MED ADMIN — azithromycin (ZITHROMAX) tablet 250 mg: 250 mg | ORAL | @ 13:00:00 | Stop: 2025-08-25

## 2022-12-30 MED ADMIN — iloprost (VENTAVIS) nebulizer solution: 5 ug | RESPIRATORY_TRACT | @ 20:00:00

## 2022-12-30 MED ADMIN — calcium carbonate (TUMS) chewable tablet 200 mg elem calcium: 200 mg | ORAL | @ 11:00:00

## 2022-12-30 MED ADMIN — insulin lispro (HumaLOG) injection 3 Units: 3 [IU] | SUBCUTANEOUS | @ 01:00:00

## 2022-12-30 MED ADMIN — ipratropium-albuterol (DUO-NEB) 0.5-2.5 mg/3 mL nebulizer solution 3 mL: 3 mL | RESPIRATORY_TRACT | @ 12:00:00 | Stop: 2022-12-30

## 2022-12-30 NOTE — Unmapped (Signed)
Pulmonary Consult Service  Follow-Up Note      Primary Service:   Medicine - MedK  Primary Service Attending:  Ebony Hail, MD  Reason for Consult:   Assistance with Diuresis, Pulmonary HTN    ASSESSMENT and PLAN     Karen Barber is a 57 y.o. 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 Pt was found to have acute on chronic respiratory failure (sat in 70s on home 6L) in the setting of not taking her home medications (sildenafil, lasix, nebs, inhalers) in preparation for PET scan, admitted 9/11. She was admitted to the ICU initially, and weaned back to 6-8L [place]  with airway clearance and diuresis and transitioned to MPCU. Her acute on chronic hypoxemia and increase O2 requirement was likely multifactorial -- combination of volume overload as well as lack of airway clearance at home, however I think chronically her O2 requirement (related to her PH) is higher than the amount she has been on at home and she continues to have severe exertional hypoxemia probably due to her parenchymal disease, at least in part.       Continues to have some challenging managing volume status and renal function, but renal function seems to improve with gentle diuresis aiming for net -500 ml. O2 sats and volume status stable today -- hoping to transition to PO tomorrow and home early next week.    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 She is planned to continue Tyvaso at discharge. Her oxygen requirements are still 8-10L.   AKI at this time is mostly due to subtle volume overload with poor cardiac output & decreased renal blood flow. Cr is improving with diuresis per primary team and nephrology. Low threshold for inotropic support if needed -- although overall her exam & her functional improvement since starting iloprost are reassuring.   Repeat limited echo for evaluation of RV size and function shows moderately reduced systolic function compared to mildly reduced systolic function from echo 9/17. IVC size and inspiratory change suggest increased R atrial pressure (10-20 mm Hg) when compared to previous (5-10 mm Hg). Estimated PASP 84 down from 86. TR max velocity 4.1 down from 4.4.     Plan:  - Appreciate nephrology assistance with diuresis - plan to switch to PO tomorrow (Sunday, 10/12)  - Tyvaso teaching will take place when there is a confirmed discharge date. Patient does not currently have delivery device.  - Would defer RHC as this time as not really likely to change management regarding tyvaso (ie if worse would continue, if better would also continue). She is WWP, no elevated lactate to suggest cardiogenic shock.   - Continue inhaled iloprost 5 mcg q4h. Tyvaso at discharge.   - Continue sildenafil 40 mg TID  - Continue daily weight with strict I/O  - cont daily pro-BNP    2. Acute on Chronic Hypoxemic Respiratory Failure  COPD/Asthma  Pulmonary Sarcoidosis c/b Bronchiectasis  Soluble IL-2 receptor, Aspergillus Galactomannan Antigen, IgE checked 10/2022 all wnl. PET scan 9/17 does not appear to be active pulmonary sarcoidosis and again it is overall felt to be burned out from sarcoidosis  standpoint. She is now stable satting at >95% on 8-10L via [facility]. It is likely she has higher O2 requirement chronically than the amount she has been using at home. This may reflect her new baseline and this was discussed with the patient. Discussion had with the patient about more realistic oxygen sats for her and limits on activity at home to prevent desats.      Plan:  - Set O2 sat goal at 90-94%, wean oxygen as tolerated  - continue home pulmicort, brovana, PRN albuterol, azithromycin  - please cont airway clearance:              - duonebs TID and HTS (3%) BID with aerobika    3. Chronic Pulmonary Aspergillosis   Aspergillus Galactomannan Antigen, IgE checked 10/2022 all wnl.   - continue home posaconazole   - pt has outpt evaluation for [facility] scheduled for 10/25 for history of recurrent hemoptysis    [name] was seen, examined and discussed with  Dr. Jhonnie Garner. Lurena Nida   Thank you for involving [place] in her care. We look forward to following with you.  Please don't hesitate to page [place] with any questions or concerns at [phone] (pulmonary consult fellow).    Alesia Banda, MD  Pulmonary Critical Care Fellow     SUBJECTIVE:     See prior notes for HPI.    No acute concerns this morning. She says that occasionally she will use NRB when her nose gets too dry/senstive from using nasal cannula. Denies new SOB, CP.      Medications:     Scheduled Meds:   acetaminophen  650 mg Oral Q6H    arformoterol  15 mcg Nebulization BID (RT)    azithromycin  250 mg Oral Daily    budesonide  0.5 mg Nebulization BID (RT)    diclofenac sodium  2 g Topical QID    enoxaparin (LOVENOX) injection  40 mg Subcutaneous Nightly    flu vacc [id number] 6mos up(PF)  0.5 mL Intramuscular During hospitalization    [Provider Hold] furosemide  80 mg Oral BID    gabapentin  100 mg Oral TID    iloprost  5 mcg Nebulization 6XD    insulin glargine  12 Units Subcutaneous Nightly    insulin lispro  0-20 Units Subcutaneous ACHS    insulin lispro  3 Units Subcutaneous 3xd Meals    ipratropium-albuterol  3 mL Nebulization Q6H (RT)    lidocaine  1 patch Transdermal Daily    magnesium oxide-Mg AA chelate  2 tablet Oral BID    posaconazole  200 mg Oral Daily    sildenafiL (pulm.hypertension)  40 mg Oral TID     Continuous Infusions:  PRN Meds:.albuterol, aluminum-magnesium hydroxide-simethicone, calcium carbonate, dextrose in water, glucagon, melatonin, ondansetron **OR** ondansetron, polyethylene glycol, prochlorperazine, senna, sodium chloride     PHYSICAL EXAM:   BP 93/64  - Pulse 73  - Temp 36.3 ??C (97.4 ??F) (Oral)  - Resp 24  - Ht 180.3 cm (5' 11)  - Wt 86.6 kg (190 lb 14.7 oz)  - LMP  (LMP Unknown)  - SpO2 97%  - BMI 26.63 kg/m??   General: Alert, well-appearing, and in no distress.  Eyes: Anicteric sclera, conjunctiva clear.  ENT:   no drainage.  Cardiovascular: Regular rate and rhythm.  Pulmonary: Diminished lung sounds in bilateral upper lobes, crackles bilateral bases but good airmovement at bases. Comfortable on RA  Musculoskeletal: No clubbing. No LE edema.   Skin: no  lesions noted on clothed exam  Neuro: No focal neurological deficits.    LABORATORY and RADIOLOGY DATA:     Pertinent Laboratory Data from the last 24 hours:  Lab Results   Component Value Date    WBC 3.0 (L) 12/30/2022    HGB 11.1 (L) 12/30/2022    HCT 34.0 12/30/2022    PLT 145 (L) 12/30/2022     Lab Results   Component Value Date    NA 136 12/30/2022    K 5.1 (H) 12/30/2022    CL 95 (L) 12/30/2022    CO2 36.0 (H) 12/30/2022    BUN 61 (H) 12/30/2022    CREATININE 2.58 (H) 12/30/2022    GLU 204 (H) 12/30/2022    CALCIUM 9.6 12/30/2022    MG 3.0 (H) 12/30/2022    PHOS 5.3 (H) 12/25/2022       Lab Results   Component Value Date    BILITOT 0.7 12/25/2022    BILIDIR <0.10 05/30/2019    PROT 7.3 12/25/2022    ALBUMIN 3.6 12/25/2022    ALT 20 12/25/2022    AST 27 12/25/2022    ALKPHOS 91 12/25/2022    GGT 60 (H) 05/31/2011       Lab Results   Component Value Date    INR 1.02 12/11/2019    APTT 30.9 12/11/2019       Pertinent Micro Data:  Microbiology Results (last day)       ** No results found for the last 24 hours. **             Pertinent Imaging Data from the last 24 hours:  10/10 Limited Echo  Summary    1. The left ventricle is relatively small in size with normal wall  thickness.    2. The left ventricular systolic function is normal, LVEF is visually  estimated at 55-60%.    3. The aortic valve is trileaflet with moderately thickened leaflets with  normal excursion.    4. The right ventricle is severely dilated in size, with moderately reduced  systolic function.    5. There is moderate tricuspid regurgitation.    6. There is severe pulmonary hypertension.    7. The right atrium is severely dilated in size.    8. IVC size and inspiratory change suggest elevated right atrial pressure.  (10-20 mmHg).

## 2022-12-30 NOTE — Unmapped (Signed)
Patient on [facility] 6LPM.  No distress noted.

## 2022-12-30 NOTE — Unmapped (Signed)
Infectious Disease ([facility]) Progress Note    Assessment & Plan:   Karen Barber is a 57 y.o. female whose presentation is complicated by bronchiectasis type 1 in the setting of pulmonary sarcoidosis (6L [place] home O2), HTN, T2DM, COPD, s/p LUL lobectomy, and asthma that presented to [facility] admitted to MICU after becoming hypoxic after holding home meds prior to PET scan. After 1 week diuresing ICU stabilized patient she was transferred to the MPCU.    Principal Problem:    Acute on chronic hypoxic respiratory failure (CMS-HCC)  Active Problems:    Pulmonary sarcoidosis (CMS-HCC)    Type 2 diabetes mellitus with hyperglycemia (CMS-HCC)    COPD (chronic obstructive pulmonary disease) (CMS-HCC)    Chronic pulmonary aspergillosis (CMS-HCC)    Pulmonary hypertension (CMS-HCC)    AKI (acute kidney injury) (CMS-HCC)    Active Problems  #Acute on Chronic Hypoxemic Respiratory Failure -   #Pulmonary Sarcoidosis -   #COPD/asthma -   #Bronchiectasis  Patient has notable history of pulmonary sarcoidosis, COPD, pulmonary hypertension and is on baseline oxygen 6 L.  Recent admission with pneumonia growing Serratia marcescens  s/p 1x Rocephin (8/18), Vancomycin (8/18-8/19), Zosyn (8/18-8/23) discharged on levofloxacin 500 mg ending on 8/31.  Patient held her medications only for the morning prior to an outpatient PET CT scan during which she became hypoxic to the 70s and was admitted to the MICU.  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 She received one dose of 125mg  of IV methylprednisolone. She was weaned from high flow nasal cannula to large bore nasal cannula after IV diuresis and a course of antibiotics for possible pneumonia with Vanco/cefepime later transition to levofloxacin with a total 1 week treatment with antibiotics. Started on vanc/cefepime for broad coverage --> stopped vanc and cefepime 9/12- then levaquin (9/12---9/16).  Patient was transferred to the floor on 9/18 after showing significant signs of improvement but still having occasional desaturations with ambulation. PET scan 9/18 is not indicative of active pulmonary sarcoidosis.  Acute exacerbation likely multifactorial including in not taking her home medications as per directed for airway clearance, volume overload, and higher O2 requirement chronically than the amount she has been using. RHC 9/20 showing significant pulm HTN disease with mean PA pressure of 47, for which pulmonology team started Iloprost. Notably, wedge pressure on that study was 5, which is reassuring against volume as driver of her respiratory status at that time. Patient has been having variations in oxygen requirement.  Nephrology considering RHC, however pulmonology is against.  Continuing IV diuresis with goal of net -500 mL.  -- Continue to wean O2 with goal saturations of 90-94% per pulmonology  -- Continuing Lasix 80 IV   -- Consulted palliative for goals of care conversation as well as symptom management.  -- Plan to get oxymizer for her while admitted. Will need oxymizer at home  -- Continue home airway clearance (DuoNebs and HTS 3 times daily with [place])  -- Continue home Pulmicort, Brovana, Atrovent, as needed albuterol, and azithromycin  -- PT and OT consult  -- Standing weights daily     #Pulmonary hypertension - RV failure - orthostatic hypotension  Patient has history of pulmonary hypertension due to pulmonary sarcoidosis/COPD.  TTE this admission 9/17 with severely dilated right atrium with severe pulmonary hypertension PASP 86 mmHg.  Estimated right atrial pressure 5 to 10 mmHg.  On Lasix 80 mg twice daily at home.  Echo showed LVEF greater than 55% and severe pulmonary hypertension no significant change since  prior study.  Right heart cath showed severe pulmonary Hypertension, markedly elevated PVR (9.3 - 10.5) and low cardiac output / index. HRCT scan shows unchanged fibrotic pulmonary sarcoidosis with bilateral segmental and subsegmental fibrosis post left upper lobectomy with unchanged apical right upper lobe and superior segment left lower lobe bullae.   -- Continue inhaled iloprost 5 mcg 6 times a day q4h; please do not mix with other nebulizer solutions   -- Plan to transition to Tyvaso nebs, working with pharmacy and [facility] nurse coordinator  -- [employer] rep to do teaching prior to DC    #Acute kidney injury on CKD3 (downtrending)  On admission patient's creatinine 1.3 which appears to be baseline. Cr fluctuating in 1.5-1.8 range.  -- Renally dose medications  -- Daily BMP    #Secondary Hyperparathyroidism, Hypocalcemia, and Hyperphosphatemia  Findings are consistent with secondary hyperparathyroidism due to CKD.  Ionized calcium: 4.37 mg/dL (low)  Phosphate: 5.6 mg/dL (elevated)  PTH: 161.0 pg/mL (elevated)  Vitamin D Total (25OH) 35  -- Reconsider need for future oral calcium carbonate 1600 mg TID  -- Reconsider need for future phoslo 667 mg PO TID   -- Reassess PTH levels in 4-6 weeks.    #Type 2 diabetes mellitus with hyperglycemia and hypoglycemia  Hemoglobin A1c 8.3%.  Home regimen includes glargine 18 units nightly and sliding scale insulin.  Large fluctuations in glucose greater than 300 and occasional values ~50.  -- Sliding scale insulin insulin sensitivity factor 40  --12 glargine nightly  -- 3 units lispro tid     #Left leg swelling (resolved )  Patient endorses intermittent left > right leg swelling. Venous doppler unremarkable.      Chronic Problems     #Chronic Pain   - Resuming gabapentin, though at reduced dose  - PT/OT consulted, appreciate recs     #Chronic pulmonary aspergillosis  Patient has history of aspergilloma and is on chronic posaconazole. Soluble IL-2 receptor, Aspergillus Galactomannan Antigen, IgE checked 10/2022 all wnl  -Continue home posaconazole Ppx  -Outpatient evaluation for [facility] scheduled for 10/25 to evaluate recurrent hemoptysis     #Iron deficiency anemia  #Chronic normocytic anemia (baseline hemoglobin 11)  Patient has some mild normocytic anemia with hemoglobin of 11. Iron panel shows iron of 37 with iron saturation of 15.   --Iron supplementation with iron dextran 1,000mg  in 250cc    #Hyperlipidemia  Last lipid profile showed an LDL of 146  -- Follow-up outpatient    Issues Impacting Complexity of Management:  -The patient is at high risk for the development of complications of volume overload due to the need to provide IV hydration for suspected hypovolemia in the setting of: Pulmonary hypertension  -Need for the following intensive monitoring parameter(s) due to high risk of clinical decline: continuous oxygen monitoring, telemetry, and frequent monitoring of urine output to assess for the efficacy of diuretic regimen  -Need for intensive oxygen therapy of HFNC, which places the patient at high risk for oxygen toxicity  -The patient is at high risk of complications from pulmonary hypertension    Daily Checklist:  Diet: Regular Diet   DVT PPx: Lovenox 40mg  q24h  Electrolytes: Replete Potassium to >/= 3.6 and Magnesium to >/= 1.8  Code Status: DNR and DNI  Dispo:  Continue MPCU    Team Contact Information:   Primary Team: Infectious Disease ([facility])  Primary Resident: Zella Richer, MD  Resident's Pager: [phone] (Infect Disease Intern - Tower)    Interval History:  No acute events overnight.     Patient reports no concerns or complaints this morning.  Continues to endorse stable shortness of breath that worsens with exertion.  Denies any fevers, chills, body aches, or new abdominal pain.  Discussed with the patient continued goal for IV diuretics to achieve stable dry weight with oral medications afterwards for maintenance.    ROS negative except for where otherwise noted above.    Objective:   Temp:  [36.3 ??C (97.4 ??F)-36.6 ??C (97.9 ??F)] 36.6 ??C (97.9 ??F)  Heart Rate:  [73-83] 73  SpO2 Pulse:  [75-82] 75  Resp:  [18-29] 24  BP: (90-99)/(50-71) 93/64  FiO2 (%):  [44 %] 44 %  SpO2:  [90 %-100 %] 97 %,   Intake/Output Summary (Last 24 hours) at 12/30/2022 1348  Last data filed at 12/30/2022 0530  Gross per 24 hour   Intake 530 ml   Output 600 ml   Net -70 ml   , FiO2 (%):  [44 %] 44 %  O2 Device: Large bore nasal cannula (cool high flow)  O2 Flow Rate (L/min):  [6 L/min-9 L/min] 6 L/min    Gen: NAD, converses well  HENT: Atraumatic, normocephalic  Heart: RRR  Lungs: Decreased breath sounds bilaterally, no adventitious lung sounds, no increased work of breathing  Abdomen: Soft, nontender nondistended  Extremities: Stable lower extremity pedal edema

## 2022-12-31 LAB — BASIC METABOLIC PANEL
ANION GAP: 4 mmol/L — ABNORMAL LOW (ref 5–14)
BLOOD UREA NITROGEN: 62 mg/dL — ABNORMAL HIGH (ref 9–23)
BUN / CREAT RATIO: 24
CALCIUM: 10.2 mg/dL (ref 8.7–10.4)
CHLORIDE: 95 mmol/L — ABNORMAL LOW (ref 98–107)
CO2: 38 mmol/L — ABNORMAL HIGH (ref 20.0–31.0)
CREATININE: 2.57 mg/dL — ABNORMAL HIGH
EGFR CKD-EPI (2021) FEMALE: 21 mL/min/{1.73_m2} — ABNORMAL LOW (ref >=60)
GLUCOSE RANDOM: 88 mg/dL (ref 70–179)
POTASSIUM: 5 mmol/L — ABNORMAL HIGH (ref 3.4–4.8)
SODIUM: 137 mmol/L (ref 135–145)

## 2022-12-31 LAB — CBC W/ AUTO DIFF
BASOPHILS ABSOLUTE COUNT: 0 10*9/L (ref 0.0–0.1)
BASOPHILS RELATIVE PERCENT: 1.3 %
EOSINOPHILS ABSOLUTE COUNT: 0.3 10*9/L (ref 0.0–0.5)
EOSINOPHILS RELATIVE PERCENT: 9.4 %
HEMATOCRIT: 36.6 % (ref 34.0–44.0)
HEMOGLOBIN: 11.9 g/dL (ref 11.3–14.9)
LYMPHOCYTES ABSOLUTE COUNT: 0.7 10*9/L — ABNORMAL LOW (ref 1.1–3.6)
LYMPHOCYTES RELATIVE PERCENT: 22.8 %
MEAN CORPUSCULAR HEMOGLOBIN CONC: 32.5 g/dL (ref 32.0–36.0)
MEAN CORPUSCULAR HEMOGLOBIN: 27.6 pg (ref 25.9–32.4)
MEAN CORPUSCULAR VOLUME: 85 fL (ref 77.6–95.7)
MEAN PLATELET VOLUME: 8.3 fL (ref 6.8–10.7)
MONOCYTES ABSOLUTE COUNT: 0.6 10*9/L (ref 0.3–0.8)
MONOCYTES RELATIVE PERCENT: 17.5 %
NEUTROPHILS ABSOLUTE COUNT: 1.6 10*9/L — ABNORMAL LOW (ref 1.8–7.8)
NEUTROPHILS RELATIVE PERCENT: 49 %
PLATELET COUNT: 168 10*9/L (ref 150–450)
RED BLOOD CELL COUNT: 4.31 10*12/L (ref 3.95–5.13)
RED CELL DISTRIBUTION WIDTH: 18.3 % — ABNORMAL HIGH (ref 12.2–15.2)
WBC ADJUSTED: 3.2 10*9/L — ABNORMAL LOW (ref 3.6–11.2)

## 2022-12-31 LAB — PRO-BNP: PRO-BNP: 10871 pg/mL — ABNORMAL HIGH (ref <=300.0)

## 2022-12-31 LAB — LACTATE, VENOUS, WHOLE BLOOD: LACTATE BLOOD VENOUS: 1.8 mmol/L (ref 0.5–1.8)

## 2022-12-31 MED ADMIN — melatonin tablet 6 mg: 6 mg | ORAL | @ 01:00:00

## 2022-12-31 MED ADMIN — iloprost (VENTAVIS) nebulizer solution: 5 ug | RESPIRATORY_TRACT | @ 19:00:00

## 2022-12-31 MED ADMIN — bumetanide (BUMEX) tablet 3 mg: 3 mg | ORAL | @ 14:00:00

## 2022-12-31 MED ADMIN — sildenafiL (pulm.hypertension) (REVATIO) tablet 40 mg: 40 mg | ORAL | @ 01:00:00

## 2022-12-31 MED ADMIN — sildenafiL (pulm.hypertension) (REVATIO) tablet 40 mg: 40 mg | ORAL | @ 18:00:00

## 2022-12-31 MED ADMIN — acetaminophen (TYLENOL) tablet 650 mg: 650 mg | ORAL | @ 23:00:00

## 2022-12-31 MED ADMIN — insulin lispro (HumaLOG) injection 0-20 Units: 0-20 [IU] | SUBCUTANEOUS | @ 01:00:00

## 2022-12-31 MED ADMIN — enoxaparin (LOVENOX) syringe 40 mg: 40 mg | SUBCUTANEOUS | @ 01:00:00

## 2022-12-31 MED ADMIN — acetaminophen (TYLENOL) tablet 650 mg: 650 mg | ORAL | @ 09:00:00

## 2022-12-31 MED ADMIN — posaconazole (NOXAFIL) delayed released tablet 200 mg: 200 mg | ORAL | @ 13:00:00

## 2022-12-31 MED ADMIN — ipratropium-albuterol (DUO-NEB) 0.5-2.5 mg/3 mL nebulizer solution 3 mL: 3 mL | RESPIRATORY_TRACT | @ 02:00:00

## 2022-12-31 MED ADMIN — sildenafiL (pulm.hypertension) (REVATIO) tablet 40 mg: 40 mg | ORAL | @ 13:00:00

## 2022-12-31 MED ADMIN — gabapentin (NEURONTIN) capsule 100 mg: 100 mg | ORAL | @ 18:00:00

## 2022-12-31 MED ADMIN — iloprost (VENTAVIS) nebulizer solution: 5 ug | RESPIRATORY_TRACT | @ 16:00:00

## 2022-12-31 MED ADMIN — furosemide (LASIX) injection 80 mg: 80 mg | INTRAVENOUS | @ 21:00:00 | Stop: 2022-12-31

## 2022-12-31 MED ADMIN — iloprost (VENTAVIS) nebulizer solution: 5 ug | RESPIRATORY_TRACT | @ 22:00:00

## 2022-12-31 MED ADMIN — magnesium oxide-Mg AA chelate (Magnesium Plus Protein) 2 tablet: 2 | ORAL | @ 13:00:00

## 2022-12-31 MED ADMIN — insulin lispro (HumaLOG) injection 3 Units: 3 [IU] | SUBCUTANEOUS | @ 23:00:00

## 2022-12-31 MED ADMIN — ipratropium-albuterol (DUO-NEB) 0.5-2.5 mg/3 mL nebulizer solution 3 mL: 3 mL | RESPIRATORY_TRACT | @ 19:00:00

## 2022-12-31 MED ADMIN — iloprost (VENTAVIS) nebulizer solution: 5 ug | RESPIRATORY_TRACT | @ 05:00:00

## 2022-12-31 MED ADMIN — magnesium oxide-Mg AA chelate (Magnesium Plus Protein) 2 tablet: 2 | ORAL | @ 01:00:00

## 2022-12-31 MED ADMIN — gabapentin (NEURONTIN) capsule 100 mg: 100 mg | ORAL | @ 01:00:00

## 2022-12-31 MED ADMIN — iloprost (VENTAVIS) nebulizer solution: 5 ug | RESPIRATORY_TRACT | @ 02:00:00

## 2022-12-31 MED ADMIN — azithromycin (ZITHROMAX) tablet 250 mg: 250 mg | ORAL | @ 13:00:00 | Stop: 2025-08-25

## 2022-12-31 MED ADMIN — arformoterol (BROVANA) nebulizer solution 15 mcg/2 mL: 15 ug | RESPIRATORY_TRACT | @ 12:00:00

## 2022-12-31 MED ADMIN — iloprost (VENTAVIS) nebulizer solution: 5 ug | RESPIRATORY_TRACT | @ 12:00:00

## 2022-12-31 MED ADMIN — lidocaine (ASPERCREME) 4 % 1 patch: 1 | TRANSDERMAL | @ 13:00:00

## 2022-12-31 MED ADMIN — lidocaine (ASPERCREME) 4 % 1 patch: 1 | TRANSDERMAL | @ 01:00:00

## 2022-12-31 MED ADMIN — ipratropium-albuterol (DUO-NEB) 0.5-2.5 mg/3 mL nebulizer solution 3 mL: 3 mL | RESPIRATORY_TRACT | @ 14:00:00

## 2022-12-31 MED ADMIN — budesonide (PULMICORT) nebulizer solution 0.5 mg: .5 mg | RESPIRATORY_TRACT | @ 12:00:00

## 2022-12-31 MED ADMIN — budesonide (PULMICORT) nebulizer solution 0.5 mg: .5 mg | RESPIRATORY_TRACT | @ 02:00:00

## 2022-12-31 MED ADMIN — gabapentin (NEURONTIN) capsule 100 mg: 100 mg | ORAL | @ 13:00:00

## 2022-12-31 MED ADMIN — arformoterol (BROVANA) nebulizer solution 15 mcg/2 mL: 15 ug | RESPIRATORY_TRACT | @ 02:00:00

## 2022-12-31 MED ADMIN — insulin glargine (LANTUS) injection 12 Units: 12 [IU] | SUBCUTANEOUS | @ 01:00:00

## 2022-12-31 MED ADMIN — acetaminophen (TYLENOL) tablet 650 mg: 650 mg | ORAL | @ 04:00:00

## 2022-12-31 MED ADMIN — acetaminophen (TYLENOL) tablet 650 mg: 650 mg | ORAL | @ 16:00:00

## 2022-12-31 NOTE — Unmapped (Signed)
Nephrology Treatment Plan Note    Requesting Attending Physician :  Ebony Hail, MD  Service Requesting Consult : Infectious Disease ([facility])  Reason for Consult: AKI    Assessment and Plan:  57 year old woman with a history of pulmonary sarcoidosis complicated by pulmonary hypertension on sildenafil and iloprost with 6 L nasal cannula O2 baseline initially admitted for hypoxemia status post diuresis and treatment for superimposed pneumonia now with AKI    # AKI on CKD  Baseline creatinine 1.3-1.5 (?diabetes) though recently has stabilized in the 1.5-1.7 range.  On 10/7 began rising after diuresis of 2.7 L of urine on 10/6, and creatinine up to 2.95 at the time of consult.  DDx of prerenal from relative volume depletion, cardiorenal given narrow threshold for forward flow, congestion though suspect this is not the case in the setting of good urine output and rising CO2 despite BNP's rising over the last few days.  Volume status exam is extremely challenging in the setting of her pulmonary hypertension but fortunately has stabilized and improved after gentle diuresis favoring cardiorenal congestive AKI rather than the opposite with improvement in her BNP. However, now not making much urine and respiratory status still remains tenuous. This reinforces a very narrow window of euvolemia  - Sediment 10/9 bland  - if continues to fluctuate in O2 requirements and renal function, may consider repeat right heart cath to get a sense of PA pressures and filling pressures given hypoxemia increasing? appreciate pulmonary recommendations regarding this  - If no response to PO bumex today, would have low threshold to switch back to IV lasix at increased dose for net neg 500cc    # Pulmonary sarcoidosis complicated by pulmonary hypertension on chronic oxygen  - Evaluation and management per primary team  - No changes to management from a nephrology standpoint at this time    Karen Lundborg, MD  12/31/2022 3:28 PM

## 2022-12-31 NOTE — Unmapped (Signed)
Infectious Disease ([facility]) Progress Note    Assessment & Plan:   Karen Barber is a 56 y.o. female whose presentation is complicated by bronchiectasis type 1 in the setting of pulmonary sarcoidosis (6L [place] home O2), HTN, T2DM, COPD, s/p LUL lobectomy, and asthma that presented to [facility] admitted to MICU after becoming hypoxic after holding home meds prior to PET scan. After 1 week diuresing ICU stabilized patient she was transferred to the MPCU.    Principal Problem:    Acute on chronic hypoxic respiratory failure (CMS-HCC)  Active Problems:    Pulmonary sarcoidosis (CMS-HCC)    Type 2 diabetes mellitus with hyperglycemia (CMS-HCC)    COPD (chronic obstructive pulmonary disease) (CMS-HCC)    Chronic pulmonary aspergillosis (CMS-HCC)    Pulmonary hypertension (CMS-HCC)    AKI (acute kidney injury) (CMS-HCC)    Active Problems  #Acute on Chronic Hypoxemic Respiratory Failure -   #Pulmonary Sarcoidosis -   #COPD/asthma -   #Bronchiectasis  Patient has notable history of pulmonary sarcoidosis, COPD, pulmonary hypertension and is on baseline oxygen 6 L.  Recent admission with pneumonia growing Serratia marcescens  s/p 1x Rocephin (8/18), Vancomycin (8/18-8/19), Zosyn (8/18-8/23) discharged on levofloxacin 500 mg ending on 8/31.  Patient held her medications only for the morning prior to an outpatient PET CT scan during which she became hypoxic to the 70s and was admitted to the MICU.  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 She received one dose of 125mg  of IV methylprednisolone. She was weaned from high flow nasal cannula to large bore nasal cannula after IV diuresis and a course of antibiotics for possible pneumonia with Vanco/cefepime later transition to levofloxacin with a total 1 week treatment with antibiotics. Started on vanc/cefepime for broad coverage --> stopped vanc and cefepime 9/12- then levaquin (9/12---9/16).  Patient was transferred to the floor on 9/18 after showing significant signs of improvement but still having occasional desaturations with ambulation. PET scan 9/18 is not indicative of active pulmonary sarcoidosis.  Acute exacerbation likely multifactorial including in not taking her home medications as per directed for airway clearance, volume overload, and higher O2 requirement chronically than the amount she has been using. RHC 9/20 showing significant pulm HTN disease with mean PA pressure of 47, for which pulmonology team started Iloprost. Notably, wedge pressure on that study was 5, which is reassuring against volume as driver of her respiratory status at that time. Patient has been having variations in oxygen requirement.  Nephrology considering RHC, however pulmonology is against.  Continuing IV diuresis with goal of net -500 mL. Will trial oral Bumex to transition for IV diuresis.   -- Continue to wean O2 with goal saturations of 90-94% per pulmonology  -- Start po Bumex 3 mg, monitor UOP. Will likely need BID dosing.  Previously tolerating IV Lasix 80 twice daily  -- Consulted palliative for goals of care conversation as well as symptom management.  -- Plan to get oxymizer for her while admitted. Will need oxymizer at home  -- Continue home airway clearance (DuoNebs and HTS 3 times daily with [place])  -- Continue home Pulmicort, Brovana, Atrovent, as needed albuterol, and azithromycin  -- PT and OT consult  -- Standing weights daily     #Pulmonary hypertension - RV failure - orthostatic hypotension  Patient has history of pulmonary hypertension due to pulmonary sarcoidosis/COPD.  TTE this admission 9/17 with severely dilated right atrium with severe pulmonary hypertension PASP 86 mmHg.  Estimated right atrial pressure 5 to 10  mmHg.  On Lasix 80 mg twice daily at home.  Echo showed LVEF greater than 55% and severe pulmonary hypertension no significant change since prior study.  Right heart cath showed severe pulmonary Hypertension, markedly elevated PVR (9.3 - 10.5) and low cardiac output / index. HRCT scan shows unchanged fibrotic pulmonary sarcoidosis with bilateral segmental and subsegmental fibrosis post left upper lobectomy with unchanged apical right upper lobe and superior segment left lower lobe bullae.   -- Continue inhaled iloprost 5 mcg 6 times a day q4h; please do not mix with other nebulizer solutions   -- Plan to transition to Tyvaso nebs, working with pharmacy and [facility] nurse coordinator  -- [employer] rep to do teaching prior to DC    #Acute kidney injury on CKD3 (downtrending)  On admission patient's creatinine 1.3 which appears to be baseline. Cr fluctuating in 1.5-1.8 range. Cr improving with diuresis   -- Renally dose medications  -- Daily BMP    #Secondary Hyperparathyroidism, Hypocalcemia, and Hyperphosphatemia  Findings are consistent with secondary hyperparathyroidism due to CKD.  Ionized calcium: 4.37 mg/dL (low)  Phosphate: 5.6 mg/dL (elevated)  PTH: 191.4 pg/mL (elevated)  Vitamin D Total (25OH) 35  -- Reconsider need for future oral calcium carbonate 1600 mg TID  -- Reconsider need for future phoslo 667 mg PO TID   -- Reassess PTH levels in 4-6 weeks.    #Type 2 diabetes mellitus with hyperglycemia and hypoglycemia  Hemoglobin A1c 8.3%.  Home regimen includes glargine 18 units nightly and sliding scale insulin.  Large fluctuations in glucose greater than 300 and occasional values ~50.  -- Sliding scale insulin insulin sensitivity factor 40  --12 glargine nightly  -- 3 units lispro tid     #Left leg swelling (resolved )  Patient endorses intermittent left > right leg swelling. Venous doppler unremarkable.      Chronic Problems     #Chronic Pain   - Resuming gabapentin, though at reduced dose  - PT/OT consulted, appreciate recs     #Chronic pulmonary aspergillosis  Patient has history of aspergilloma and is on chronic posaconazole. Soluble IL-2 receptor, Aspergillus Galactomannan Antigen, IgE checked 10/2022 all wnl  -Continue home posaconazole Ppx  -Outpatient evaluation for [facility] scheduled for 10/25 to evaluate recurrent hemoptysis     #Iron deficiency anemia  #Chronic normocytic anemia (baseline hemoglobin 11)  Patient has some mild normocytic anemia with hemoglobin of 11. Iron panel shows iron of 37 with iron saturation of 15.   --Iron supplementation with iron dextran 1,000mg  in 250cc    #Hyperlipidemia  Last lipid profile showed an LDL of 146  -- Follow-up outpatient    Issues Impacting Complexity of Management:  -The patient is at high risk for the development of complications of volume overload due to the need to provide IV hydration for suspected hypovolemia in the setting of: Pulmonary hypertension  -Need for the following intensive monitoring parameter(s) due to high risk of clinical decline: continuous oxygen monitoring, telemetry, and frequent monitoring of urine output to assess for the efficacy of diuretic regimen  -Need for intensive oxygen therapy of HFNC, which places the patient at high risk for oxygen toxicity  -The patient is at high risk of complications from pulmonary hypertension    Daily Checklist:  Diet: Regular Diet   DVT PPx: Lovenox 40mg  q24h  Electrolytes: Replete Potassium to >/= 3.6 and Magnesium to >/= 1.8  Code Status: DNR and DNI  Dispo:  Continue MPCU    Team Contact Information:  Primary Team: Infectious Disease ([facility])  Primary Resident: Dahlia Byes, MD  Resident's Pager: [phone] Evelene Croon Disease Intern - [facility])    Interval History:     No acute events overnight.     Patient reports that she was having intermittent thoughts of depression feeling that she was not making significant strides in her care.  However, she was comforted when a nurse talked her about her faith as she slept better last night than she has in quite a while.  We discussed the reasoning for transitioning to oral diuresis in order to try to get her home.  She has no other acute concerns at this time    ROS negative except for where otherwise noted above.    Objective: Temp:  [35.9 ??C (96.6 ??F)-36.6 ??C (97.9 ??F)] 35.9 ??C (96.6 ??F)  Heart Rate:  [71-82] 71  SpO2 Pulse:  [76-83] 76  Resp:  [16-29] 29  BP: (80-104)/(50-68) 80/50  FiO2 (%):  [44 %-60 %] 60 %  SpO2:  [90 %-99 %] 99 %,   Intake/Output Summary (Last 24 hours) at 12/31/2022 0612  Last data filed at 12/31/2022 0506  Gross per 24 hour   Intake 460 ml   Output 750 ml   Net -290 ml   , FiO2 (%):  [44 %-60 %] 60 %  O2 Device: Large bore nasal cannula (cool high flow)  O2 Flow Rate (L/min):  [6 L/min-12 L/min] 8 L/min    Gen: NAD, converses well in full sentences  HENT: Atraumatic, normocephalic  Heart: RRR,   Lungs: Decreased breath sounds bilaterally, no adventitious lung sounds, no increased work of breathing, no wheezing  Abdomen: Soft, nontender nondistended  Extremities: Stable lower extremity pedal edema

## 2022-12-31 NOTE — Unmapped (Signed)
Pulmonary Consult Service  Follow-Up Note      Primary Service:   Medicine - MedK  Primary Service Attending:  Ebony Hail, MD  Reason for Consult:   Assistance with Diuresis, Pulmonary HTN    ASSESSMENT and PLAN     Karen Barber is a 57 y.o. 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 Pt was found to have acute on chronic respiratory failure (sat in 70s on home 6L) in the setting of not taking her home medications (sildenafil, lasix, nebs, inhalers) in preparation for PET scan, admitted 9/11. She was admitted to the ICU initially, and weaned back to 6-8L [place]  with airway clearance and diuresis and transitioned to MPCU. Her acute on chronic hypoxemia and increase O2 requirement was likely multifactorial -- combination of volume overload as well as lack of airway clearance at home, however I think chronically her O2 requirement (related to her PH) is higher than the amount she has been on at home and she continues to have severe exertional hypoxemia probably due to her parenchymal disease, at least in part.       Continues to have some challenging managing volume status and renal function, but renal function seems to improve with gentle diuresis aiming for net -500 ml. Had some hypotensive episodes with IV diuresis, although was responding well.    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 She is planned to continue Tyvaso at discharge. Her oxygen requirements are still 8-10L.   AKI at this time is mostly due to subtle volume overload with poor cardiac output & decreased renal blood flow. Cr is improving with diuresis per primary team and nephrology. Low threshold for inotropic support if needed -- although overall her exam & her functional improvement since starting iloprost are reassuring.   Repeat limited echo for evaluation of RV size and function shows moderately reduced systolic function compared to mildly reduced systolic function from echo 9/17. IVC size and inspiratory change suggest increased R atrial pressure (10-20 mm Hg) when compared to previous (5-10 mm Hg). Estimated PASP 84 down from 86. TR max velocity 4.1 down from 4.4.     Plan:  - okay to trial Bumex PO today from our standpoint for softer BP and increasing sCr, despite rising BNP  - Tyvaso teaching will take place when there is a confirmed discharge date. Patient does not currently have delivery device.  - Would defer RHC as this time as not really likely to change management regarding tyvaso (ie if worse would continue, if better would also continue). She is WWP, no elevated lactate to suggest cardiogenic shock.   - Continue inhaled iloprost 5 mcg q4h. Tyvaso at discharge.   - Continue sildenafil 40 mg TID  - Continue daily weight with strict I/O  - cont daily pro-BNP    2. Acute on Chronic Hypoxemic Respiratory Failure  COPD/Asthma  Pulmonary Sarcoidosis c/b Bronchiectasis  Soluble IL-2 receptor, Aspergillus Galactomannan Antigen, IgE checked 10/2022 all wnl. PET scan 9/17 does not appear to be active pulmonary sarcoidosis and again it is overall felt to be burned out from sarcoidosis standpoint. She is now  stable satting at >95% on 8-10L via LBNC. It is likely she has higher O2 requirement chronically than the amount she has been using at home. This may reflect her new baseline and this was discussed with the patient. Discussion had with the patient about more realistic oxygen sats for her and limits on activity at home to prevent desats.      Plan:  - Set O2 sat goal at 90-94%, wean oxygen as tolerated  - continue home pulmicort, brovana, PRN albuterol, azithromycin  - please cont airway clearance:              - duonebs TID and HTS (3%) BID with aerobika    3. Chronic Pulmonary Aspergillosis   Aspergillus Galactomannan Antigen, IgE checked 10/2022 all wnl.   - continue home posaconazole   - pt has outpt evaluation for [facility] scheduled for 10/25 for history of recurrent hemoptysis    Karen Barber was seen, examined and discussed with  Dr. Jhonnie Garner. Lurena Nida   Thank you for involving [place] in her care. We look forward to following with you.  Please don't hesitate to page [place] with any questions or concerns at [phone] (pulmonary consult fellow).    Alesia Banda, MD  Pulmonary Critical Care Fellow     SUBJECTIVE:     See prior notes for HPI.    - No acute concerns this morning.   - on 8L, doing a neb at time of eval  - denies swelling, abd pain   - was not symptomatic during hypotensive episode this AM    Medications:     Scheduled Meds:   acetaminophen  650 mg Oral Q6H    arformoterol  15 mcg Nebulization BID (RT)    azithromycin  250 mg Oral Daily    budesonide  0.5 mg Nebulization BID (RT)    bumetanide  3 mg Oral Daily    diclofenac sodium  2 g Topical QID    enoxaparin (LOVENOX) injection  40 mg Subcutaneous Nightly    flu vacc [id number] 6mos up(PF)  0.5 mL Intramuscular During hospitalization    [Provider Hold] furosemide  80 mg Oral BID    gabapentin  100 mg Oral TID    iloprost  5 mcg Nebulization 6XD    insulin glargine  12 Units Subcutaneous Nightly    insulin lispro  0-20 Units Subcutaneous ACHS    insulin lispro  3 Units Subcutaneous 3xd Meals    ipratropium-albuterol  3 mL Nebulization Q6H (RT)    lidocaine  1 patch Transdermal Daily    magnesium oxide-Mg AA chelate  2 tablet Oral BID    posaconazole  200 mg Oral Daily    sildenafiL (pulm.hypertension)  40 mg Oral TID     Continuous Infusions:  PRN Meds:.albuterol, aluminum-magnesium hydroxide-simethicone, calcium carbonate, dextrose in water, glucagon, melatonin, ondansetron **OR** ondansetron, polyethylene glycol, prochlorperazine, senna, sodium chloride     PHYSICAL EXAM:   BP 96/69  - Pulse 80  - Temp 36.3 ??C (97.4 ??F) (Oral)  - Resp 25  - Ht 180.3 cm (5' 11)  - Wt 86.6 kg (190 lb 14.7 oz)  - LMP  (LMP Unknown)  - SpO2 100%  - BMI 26.63 kg/m??   General: Alert, well-appearing, and in no distress.  Eyes: Anicteric sclera, conjunctiva clear.  ENT:   no drainage.  Cardiovascular: Regular rate and rhythm.  Pulmonary: Diminished lung sounds in bilateral upper lobes, crackles bilateral bases but good airmovement at bases. Comfortable on RA  Musculoskeletal: No clubbing. No LE edema.   Skin: no lesions noted on clothed exam  Neuro: No focal neurological deficits.    LABORATORY and RADIOLOGY DATA:     Pertinent Laboratory Data from the last 24 hours:  Lab Results   Component Value Date    WBC 3.2 (L) 12/31/2022    HGB 11.9 12/31/2022    HCT 36.6 12/31/2022    PLT 168 12/31/2022     Lab Results   Component Value Date    NA 137 12/31/2022    K 5.0 (H) 12/31/2022    CL 95 (L) 12/31/2022    CO2 38.0 (H) 12/31/2022    BUN 62 (H) 12/31/2022    CREATININE 2.57 (H) 12/31/2022    GLU 88 12/31/2022    CALCIUM 10.2 12/31/2022    MG 2.9 (H) 12/30/2022    PHOS 5.3 (H) 12/25/2022       Lab Results   Component Value Date    BILITOT 0.7 12/25/2022    BILIDIR <0.10 05/30/2019    PROT 7.3 12/25/2022    ALBUMIN 3.6 12/25/2022    ALT 20 12/25/2022    AST 27 12/25/2022    ALKPHOS 91 12/25/2022    GGT 60 (H) 05/31/2011       Lab Results   Component Value Date    INR 1.02 12/11/2019    APTT 30.9 12/11/2019       Pertinent Micro Data:  Microbiology Results (last day)       ** No results found for the last 24 hours. **             Pertinent Imaging Data from the last 24 hours:  10/10 Limited Echo  Summary    1. The left ventricle is relatively small in size with normal wall  thickness.    2. The left ventricular systolic function is normal, LVEF is visually  estimated at 55-60%.    3. The aortic valve is trileaflet with moderately thickened leaflets with  normal excursion.    4. The right ventricle is severely dilated in size, with moderately reduced  systolic function.    5. There is moderate tricuspid regurgitation.    6. There is severe pulmonary hypertension.    7. The right atrium is severely dilated in size.    8. IVC size and inspiratory change suggest elevated right atrial pressure.  (10-20 mmHg).

## 2023-01-01 LAB — BASIC METABOLIC PANEL
ANION GAP: 4 mmol/L — ABNORMAL LOW (ref 5–14)
BLOOD UREA NITROGEN: 60 mg/dL — ABNORMAL HIGH (ref 9–23)
BUN / CREAT RATIO: 24
CALCIUM: 10.2 mg/dL (ref 8.7–10.4)
CHLORIDE: 95 mmol/L — ABNORMAL LOW (ref 98–107)
CO2: 37 mmol/L — ABNORMAL HIGH (ref 20.0–31.0)
CREATININE: 2.55 mg/dL — ABNORMAL HIGH
EGFR CKD-EPI (2021) FEMALE: 21 mL/min/{1.73_m2} — ABNORMAL LOW (ref >=60)
GLUCOSE RANDOM: 93 mg/dL (ref 70–179)
POTASSIUM: 5.3 mmol/L — ABNORMAL HIGH (ref 3.4–4.8)
SODIUM: 136 mmol/L (ref 135–145)

## 2023-01-01 LAB — CBC
HEMATOCRIT: 34.8 % (ref 34.0–44.0)
HEMOGLOBIN: 11.4 g/dL (ref 11.3–14.9)
MEAN CORPUSCULAR HEMOGLOBIN CONC: 32.8 g/dL (ref 32.0–36.0)
MEAN CORPUSCULAR HEMOGLOBIN: 27.9 pg (ref 25.9–32.4)
MEAN CORPUSCULAR VOLUME: 85.2 fL (ref 77.6–95.7)
MEAN PLATELET VOLUME: 8.9 fL (ref 6.8–10.7)
PLATELET COUNT: 171 10*9/L (ref 150–450)
RED BLOOD CELL COUNT: 4.08 10*12/L (ref 3.95–5.13)
RED CELL DISTRIBUTION WIDTH: 18.7 % — ABNORMAL HIGH (ref 12.2–15.2)
WBC ADJUSTED: 3.1 10*9/L — ABNORMAL LOW (ref 3.6–11.2)

## 2023-01-01 LAB — CYSTATIN C
CYSTATIN C: 3.26 mg/L — ABNORMAL HIGH (ref 0.64–1.23)
EGFR CKD-EPI (2012) CYSTATIN C FEMALE: 15 mL/min/{1.73_m2} — ABNORMAL LOW (ref >=60)

## 2023-01-01 LAB — MAGNESIUM
MAGNESIUM: 3 mg/dL — ABNORMAL HIGH (ref 1.6–2.6)
MAGNESIUM: 2.8 mg/dL — ABNORMAL HIGH (ref 1.6–2.6)

## 2023-01-01 LAB — LACTATE, VENOUS, WHOLE BLOOD: LACTATE BLOOD VENOUS: 2.1 mmol/L — ABNORMAL HIGH (ref 0.5–1.8)

## 2023-01-01 LAB — PRO-BNP: PRO-BNP: 10193 pg/mL — ABNORMAL HIGH (ref <=300.0)

## 2023-01-01 MED ADMIN — acetaminophen (TYLENOL) tablet 650 mg: 650 mg | ORAL | @ 09:00:00

## 2023-01-01 MED ADMIN — posaconazole (NOXAFIL) delayed released tablet 200 mg: 200 mg | ORAL | @ 12:00:00

## 2023-01-01 MED ADMIN — insulin lispro (HumaLOG) injection 3 Units: 3 [IU] | SUBCUTANEOUS | @ 22:00:00

## 2023-01-01 MED ADMIN — magnesium oxide-Mg AA chelate (Magnesium Plus Protein) 2 tablet: 2 | ORAL | @ 12:00:00

## 2023-01-01 MED ADMIN — iloprost (VENTAVIS) nebulizer solution: 5 ug | RESPIRATORY_TRACT | @ 01:00:00

## 2023-01-01 MED ADMIN — insulin glargine (LANTUS) injection 12 Units: 12 [IU] | SUBCUTANEOUS | @ 01:00:00

## 2023-01-01 MED ADMIN — ipratropium-albuterol (DUO-NEB) 0.5-2.5 mg/3 mL nebulizer solution 3 mL: 3 mL | RESPIRATORY_TRACT | @ 20:00:00

## 2023-01-01 MED ADMIN — azithromycin (ZITHROMAX) tablet 250 mg: 250 mg | ORAL | @ 12:00:00 | Stop: 2025-08-25

## 2023-01-01 MED ADMIN — gabapentin (NEURONTIN) capsule 100 mg: 100 mg | ORAL | @ 17:00:00

## 2023-01-01 MED ADMIN — bumetanide (BUMEX) tablet 4 mg: 4 mg | ORAL | @ 15:00:00 | Stop: 2023-01-01

## 2023-01-01 MED ADMIN — ipratropium-albuterol (DUO-NEB) 0.5-2.5 mg/3 mL nebulizer solution 3 mL: 3 mL | RESPIRATORY_TRACT | @ 01:00:00

## 2023-01-01 MED ADMIN — insulin lispro (HumaLOG) injection 3 Units: 3 [IU] | SUBCUTANEOUS | @ 15:00:00

## 2023-01-01 MED ADMIN — iloprost (VENTAVIS) nebulizer solution: 5 ug | RESPIRATORY_TRACT | @ 05:00:00

## 2023-01-01 MED ADMIN — iloprost (VENTAVIS) nebulizer solution: 5 ug | RESPIRATORY_TRACT | @ 20:00:00

## 2023-01-01 MED ADMIN — arformoterol (BROVANA) nebulizer solution 15 mcg/2 mL: 15 ug | RESPIRATORY_TRACT | @ 13:00:00

## 2023-01-01 MED ADMIN — furosemide (LASIX) injection 80 mg: 80 mg | INTRAVENOUS | @ 20:00:00 | Stop: 2023-01-01

## 2023-01-01 MED ADMIN — gabapentin (NEURONTIN) capsule 100 mg: 100 mg | ORAL | @ 01:00:00

## 2023-01-01 MED ADMIN — acetaminophen (TYLENOL) tablet 650 mg: 650 mg | ORAL | @ 22:00:00

## 2023-01-01 MED ADMIN — budesonide (PULMICORT) nebulizer solution 0.5 mg: .5 mg | RESPIRATORY_TRACT | @ 01:00:00

## 2023-01-01 MED ADMIN — enoxaparin (LOVENOX) syringe 40 mg: 40 mg | SUBCUTANEOUS | @ 01:00:00

## 2023-01-01 MED ADMIN — sildenafiL (pulm.hypertension) (REVATIO) tablet 40 mg: 40 mg | ORAL | @ 01:00:00

## 2023-01-01 MED ADMIN — lidocaine (ASPERCREME) 4 % 1 patch: 1 | TRANSDERMAL | @ 12:00:00

## 2023-01-01 MED ADMIN — iloprost (VENTAVIS) nebulizer solution: 5 ug | RESPIRATORY_TRACT | @ 17:00:00

## 2023-01-01 MED ADMIN — iloprost (VENTAVIS) nebulizer solution: 5 ug | RESPIRATORY_TRACT | @ 13:00:00

## 2023-01-01 MED ADMIN — melatonin tablet 6 mg: 6 mg | ORAL | @ 01:00:00

## 2023-01-01 MED ADMIN — gabapentin (NEURONTIN) capsule 100 mg: 100 mg | ORAL | @ 12:00:00

## 2023-01-01 MED ADMIN — magnesium oxide-Mg AA chelate (Magnesium Plus Protein) 2 tablet: 2 | ORAL | @ 01:00:00

## 2023-01-01 MED ADMIN — ipratropium-albuterol (DUO-NEB) 0.5-2.5 mg/3 mL nebulizer solution 3 mL: 3 mL | RESPIRATORY_TRACT | @ 13:00:00

## 2023-01-01 MED ADMIN — sildenafiL (pulm.hypertension) (REVATIO) tablet 40 mg: 40 mg | ORAL | @ 17:00:00

## 2023-01-01 MED ADMIN — acetaminophen (TYLENOL) tablet 650 mg: 650 mg | ORAL | @ 04:00:00

## 2023-01-01 MED ADMIN — arformoterol (BROVANA) nebulizer solution 15 mcg/2 mL: 15 ug | RESPIRATORY_TRACT | @ 01:00:00

## 2023-01-01 MED ADMIN — iloprost (VENTAVIS) nebulizer solution: 5 ug | RESPIRATORY_TRACT | @ 22:00:00

## 2023-01-01 MED ADMIN — insulin lispro (HumaLOG) injection 0-20 Units: 0-20 [IU] | SUBCUTANEOUS | @ 01:00:00

## 2023-01-01 MED ADMIN — diclofenac sodium (VOLTAREN) 1 % gel 2 g: 2 g | TOPICAL | @ 01:00:00

## 2023-01-01 MED ADMIN — sildenafiL (pulm.hypertension) (REVATIO) tablet 40 mg: 40 mg | ORAL | @ 12:00:00

## 2023-01-01 MED ADMIN — budesonide (PULMICORT) nebulizer solution 0.5 mg: .5 mg | RESPIRATORY_TRACT | @ 13:00:00

## 2023-01-01 NOTE — Unmapped (Signed)
VENOUS ACCESS TEAM PROCEDURE    Nurse request was placed for a PIV by Venous Access Team (VAT).  Patient was assessed at bedside for placement of a PIV. PPE were donned per protocol.  Access was obtained. Blood return noted.  Dressing intact and device well secured.  Flushed with normal saline.  See LDA for details.  Pt advised to inform RN of any s/s of discomfort at the PIV site.    Workup / Procedure Time:  15 minutes       Primary RN was notified.       Thank you,     Melodye Ped, RN Venous Access Team

## 2023-01-01 NOTE — Unmapped (Signed)
Pt is A&Ox4, VSS, O2 sats >90% on7-12L LBNC. Afebrile. No c/o pain. UO adequate, no BM this shift. Pt's appetite has been adequate. Standard precautions maintained. No falls/injuries this shift. All monitors with appropriate alarm settings, call bell within reach, see flowsheets/MAR for further info.    Specific Goal (Individualized)  Outcome: Ongoing - Unchanged  Goal: Absence of Hospital-Acquired Illness or Injury  Outcome: Ongoing - Unchanged  Intervention: Identify and Manage Fall Risk  Recent Flowsheet Documentation  Taken 01/01/2023 0800 by Shearon Balo, RN  Safety Interventions:   aspiration precautions   bed alarm   bleeding precautions   environmental modification   fall reduction program maintained   lighting adjusted for tasks/safety   low bed  Intervention: Prevent Infection  Recent Flowsheet Documentation  Taken 01/01/2023 0800 by Shearon Balo, RN  Infection Prevention:   environmental surveillance performed   equipment surfaces disinfected   hand hygiene promoted   personal protective equipment utilized   single patient room provided  Goal: Optimal Comfort and Wellbeing  Outcome: Ongoing - Unchanged  Goal: Readiness for Transition of Care  Outcome: Ongoing - Unchanged  Goal: Rounds/Family Conference  Outcome: Ongoing - Unchanged

## 2023-01-01 NOTE — Unmapped (Signed)
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  On 10/7 began rising after diuresis of 2.7 L of urine on 10/6, and creatinine up to 2.95 at the time of consult.  DDx of prerenal from relative volume depletion, cardiorenal given narrow threshold for forward flow, congestion though suspect this is not the case in the setting of good urine output and rising CO2 despite BNP's rising over the last few days.  Volume status exam is extremely challenging in the setting of her pulmonary hypertension but fortunately has stabilized and improved after gentle diuresis yesterday favoring cardiorenal congestive AKI rather than the opposite with improvement in her BNP. This reinforces a very narrow window of euvolemia  - Sediment 10/9 bland  - Pt oxygenation is at baseline of 6-8L  - unfortunately did not respond well to bumetanide 3mg  , therefore restarted on furosemide 80 mg IV with 1.3L output. Creatinine continues to be stable at 2.4-2.5. Originally we were considering R heart cath however since she is diuresing well, recc to continue for now. Assess output to bumex 4 mg oral. If improved output may transition off of IV furosemide and use oral water pills ie bumex BID.   - attempted to transition to PO bumetanide 3 mg yesterday however urine response was limited response, therefore we was placed back on furosemide 80 mg     # Pulmonary sarcoidosis complicated by pulmonary hypertension on chronic oxygen  - Evaluation and management per primary team  - No changes to management from a nephrology standpoint at this time    RECOMMENDATIONS:   - Diurese to goal of net negative 0.5-1L if monitoring ins appropriately  - Ideally use oral bumex if good response as above  Strict I and O  Recc daily weight  - We will continue to follow.     [name], MD  01/01/2023 9:59 AM     Medical decision-making for 01/01/23  Findings / Data     Patient has: []  acute illness w/systemic sxs  [mod]  []  two or more stable chronic illnesses [mod]  []  one chronic illness with acute exacerbation [mod]  []  acute complicated illness  [mod]  []  Undiagnosed new problem with uncertain prognosis  [mod] [x]  illness posing risk to life or bodily function (ex. AKI)  [high]  []  chronic illness with severe exacerbation/progression  [high]  []  chronic illness with severe side effects of treatment  [high] AKI Probs At least 2:  Probs, Data, Risk   I reviewed: [x]  primary team note  [x]  consultant note(s)  []  external records [x]  chemistry results  []  CBC results  []  blood gas results  []  Other []  procedure/op note(s)   []  radiology report(s)  []  micro result(s)  []  w/ independent historian(s) Pulm notes reviewed, Cr 2.6 stable; diuresing >=3 Data Review (2 of 3)    I independently interpreted: []  Urine Sediment  []  Renal US []  CXR Images  []  CT Images  []  Other []  EKG Tracing  Any  I discussed: []  Pathology results w/ QHPs(s) from other specialties  []  Procedural findings w/ QHPs(s) from other specialties []  Imaging w/ QHP(s) from other specialties  [x]  Treatment plan w/ QHP(s) from other specialties Plan discussed with primary team Any     Mgm't requires: []  Prescription drug(s)  [mod]  []  Kidney biopsy  [mod]  []  Central line placement  [mod] []  High risk medication use and/or intensive toxicity monitoring [high]  []  Renal replacement therapy [high]  []  High risk kidney biopsy  [high]  []  Escalation of care  [high]  []  High risk central line placement [high] IV Diuresis: check BMP/Mg  Risk      ____________________________________________________  Interval Hx  Pt is feeling very confused about her medical history and treatment. She has many questions which we attempted to answer. Denies CP, but ongoing SOB, no N, V, diarrhea or abd pain     History of Present Illness: Karen Barber is 57 y.o. 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 Nephrology has been consulted for AKI.     Patient was initially admitted 9/11 when she presented for a PET scan to evaluate her sarcoidosis but unfortunately was found to be hypoxemic on her home 6 L of nasal cannula requiring high flow nasal cannula and was admitted initially to the ICU.  She was diuresed and her oxygenation improved and she was stepped down to the MPCU.  She was treated with antibiotics as well as steroids for infection and bronchiectasis.  She underwent right heart cath on 9/20 with elevated mean PA pressures and was started on iloprost.  Notes that she has not been told she has kidney issues prior to this as she regularly follows up with a PCP.  She notes that she is not eating or drinking very well as she is worried about accumulating fluid whenever she eats salt and she does not like the food here.  Denies nausea.    Lasix dosing over the last few days has included 80 mg IV once or twice twice a day till 10/6, with diuresis holiday 10/7 and then given 60 mg IV Lasix on 10/8.    Patient does note that she is urinating less over the last few days and has to push a little bit to empty her bladder at the end.    Of note O2 requirement is gradually increasing from baseline of 6 L nasal cannula on 10/6 and 10/7 up to 9 and up to 10 L on day of consult.  Urine output of 300 up to 700 today.  Curiously proBNP has been rising the last few days though team has felt she is relatively euvolemic during this time.    Creatinine recently has steady down in the 1.6-1.7 range however starting 10/7 rising up and up to 2.95 on day of consult    INPATIENT MEDICATIONS:    Current Facility-Administered Medications:     acetaminophen (TYLENOL) tablet 650 mg, Oral, Q6H    albuterol 2.5 mg /3 mL (0.083 %) nebulizer solution 2.5 mg, Nebulization, Q6H PRN    aluminum-magnesium hydroxide-simethicone (MAALOX MAX) 80-80-8 mg/mL oral suspension, Oral, Q6H PRN    arformoterol (BROVANA) nebulizer solution 15 mcg/2 mL, Nebulization, BID (RT)  azithromycin (ZITHROMAX) tablet 250 mg, Oral, Daily    budesonide (PULMICORT) nebulizer solution 0.5 mg, Nebulization, BID (RT)    bumetanide (BUMEX) tablet 4 mg, Oral, Once    calcium carbonate (TUMS) chewable tablet 200 mg elem calcium, Oral, TID PRN    dextrose (D10W) 10% bolus 125 mL, Intravenous, Q10 Min PRN    diclofenac sodium (VOLTAREN) 1 % gel 2 g, Topical, QID    enoxaparin (LOVENOX) syringe 40 mg, Subcutaneous, Nightly    flu vaccine TS 2024-25(52mos up)(PF)(FLULAVAL, FLUARIX, FLUZONE), Intramuscular, During hospitalization    [Provider Hold] furosemide (LASIX) tablet 80 mg, Oral, BID    gabapentin (NEURONTIN) capsule 100 mg, Oral, TID    glucagon injection 1 mg, Intramuscular, Once PRN    iloprost (VENTAVIS) nebulizer solution, Nebulization, 6XD    insulin glargine (LANTUS) injection 12 Units, Subcutaneous, Nightly    insulin lispro (HumaLOG) injection 0-20 Units, Subcutaneous, ACHS    insulin lispro (HumaLOG) injection 3 Units, Subcutaneous, 3xd Meals    ipratropium-albuterol (DUO-NEB) 0.5-2.5 mg/3 mL nebulizer solution 3 mL, Nebulization, Q6H (RT)    lidocaine (ASPERCREME) 4 % 1 patch, Transdermal, Daily    magnesium oxide-Mg AA chelate (Magnesium Plus Protein) 2 tablet, Oral, BID    melatonin tablet 6 mg, Oral, Nightly PRN    ondansetron (ZOFRAN-ODT) disintegrating tablet 4 mg, Oral, Q8H PRN **OR** ondansetron (ZOFRAN) injection 4 mg, Intravenous, Q8H PRN    polyethylene glycol (MIRALAX) packet 17 g, Oral, Daily PRN    posaconazole (NOXAFIL) delayed released tablet 200 mg, Oral, Daily    prochlorperazine (COMPAZINE) tablet 5 mg, Oral, Q6H PRN    senna (SENOKOT) tablet 2 tablet, Oral, Nightly PRN    sildenafiL (pulm.hypertension) (REVATIO) tablet 40 mg, Oral, TID    sodium chloride (OCEAN) 0.65 % nasal spray 1 spray, Each Nare, Q6H PRN    OUTPATIENT MEDICATIONS:  Prior to Admission medications    Medication Dose, Route, Frequency   ACCU-CHEK GUIDE GLUCOSE METER Misc 1 Device, Other, 4 times a day ([facility]), AS DIRECTED   albuterol 2.5 mg /3 mL (0.083 %) nebulizer solution 2.5 mg, Nebulization, Every 6 hours PRN   albuterol HFA 90 mcg/actuation inhaler 2 puffs, Inhalation, Every 4 hours PRN   arformoterol (BROVANA) 15 mcg/2 mL 15 mcg, Nebulization, 2 times daily (RT)   aspirin-caffeine (BAYER BACK AND BODY) 500-32.5 mg Tab Oral, Takes 2 1/2 pills 3-4 times a day as needed.   azithromycin (ZITHROMAX) 250 MG tablet 250 mg, Oral, Every 24 hours, Start on 9/1 after completing the levofloxacin   blood sugar diagnostic Strp Other, 3 times a day (standard), ACCU-CHEK Guide meter   budesonide (PULMICORT) 0.5 mg/2 mL nebulizer solution 0.5 mg, Nebulization, 2 times daily (RT)   furosemide (LASIX) 80 MG tablet 80 mg, Oral, 2 times a day (standard), Take a third dose as needed for increasing weight or increasing swelling.   gabapentin (NEURONTIN) 300 MG capsule 300 mg, Oral, 3 times a day (standard)   insulin aspart (NOVOLOG U-100 INSULIN ASPART) 100 unit/mL vial 1-20 Units, Subcutaneous, 4 times a day ([facility]), SLIDING SCALE   insulin glargine (BASAGLAR KWIKPEN U-100 INSULIN) 100 unit/mL (3 mL) injection pen 18 Units, Subcutaneous, Nightly   ipratropium (ATROVENT) 0.02 % nebulizer solution 500 mcg, Nebulization, 4 times a day   posaconazole (NOXAFIL) 100 mg delayed released tablet 200 mg, Oral, Daily (standard)   sildenafiL, pulm.hypertension, (REVATIO) 20 mg tablet 20 mg, Oral, 3 times a day (standard)   OXYGEN-AIR DELIVERY SYSTEMS MISC Dose: 2-3 LPM  ALLERGIES:  Patient has no known allergies.    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 A.niger from sputum 08/2016-09/2016.    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    Sarcoidosis 1995    Type 2 diabetes mellitus with hyperglycemia (CMS-HCC) 07/27/2014    Visual impairment     glasses     Past Surgical History:   Procedure Laterality Date    LUNG LOBECTOMY      PR AMPUTATION TOE,MT-P JT Right 12/12/2019    Procedure: Right foot fifth toe amputation at the metatarsophalangeal joint level;  Surgeon: Britt Bottom, DPM;  Location: MAIN OR [facility];  Service: Vascular    PR AMPUTATION TOE,MT-P JT Right 06/16/2020    Procedure: Right foot amputation of 3rd toe at mpj level;  Surgeon: Britt Bottom, DPM;  Location: MAIN OR [facility];  Service: Vascular    PR RIGHT HEART CATH O2 SATURATION & CARDIAC OUTPUT N/A 04/29/2019    Procedure: Right Heart Catheterization;  Surgeon: Rosana Hoes, MD;  Location: [facility] CATH;  Service: Cardiology    PR RIGHT HEART CATH O2 SATURATION & CARDIAC OUTPUT N/A 12/08/2022    Procedure: Right Heart Catheterization;  Surgeon: Neal Dy, MD;  Location: [facility] CATH;  Service: Cardiology    PR THORACOSCOPY SURG TOT PULM DECORT Left 12/14/2014    Procedure: THORACOSCOPY SURG; W/TOT PULM DECORTIC/PNEUMOLYS;  Surgeon: Evert Kohl, MD;  Location: MAIN OR [facility];  Service: Cardiothoracic    PR THORACOSCOPY W/THERA WEDGE RESEXN INITIAL UNILAT Left 12/03/2014    Procedure: THORACOSCOPY, SURGICAL; WITH THERAPEUTIC WEDGE RESECTION (EG, MASS, NODULE) INITIAL UNILATERAL;  Surgeon: Evert Kohl, MD;  Location: MAIN OR [facility];  Service: Cardiothoracic     SOCIAL HISTORY  Social History     Social History Narrative    Not on file      reports that she quit smoking about 35 years ago. Her smoking use included cigarettes. She started smoking about 44 years ago. She has a 4.5 pack-year smoking history. She has never used smokeless tobacco. She reports that she does not drink alcohol and does not use drugs.   FAMILY HISTORY  Family History   Problem Relation Age of Onset    Dementia Mother     Diabetes Father     Diabetes Brother     Diabetes Maternal Aunt     Sarcoidosis Maternal Aunt     Diabetes Maternal Uncle     Diabetes Paternal Aunt     Diabetes Paternal Uncle         Physical Exam:   Vitals:    01/01/23 0409 01/01/23 0417 01/01/23 0818 01/01/23 0858   BP: 70/51 85/59 95/66     Pulse: 74 72 70  71   Resp: 15 21 16 21    Temp:   36.4 ??C (97.5 ??F)    TempSrc:   Oral    SpO2: 90% 97% 98% 98%   Weight:       Height:         I/O this shift:  In: 350 [P.O.:350]  Out: -     Intake/Output Summary (Last 24 hours) at 01/01/2023 0959  Last data filed at 01/01/2023 0842  Gross per 24 hour   Intake 600 ml   Output 1135 ml   Net -535 ml     Constitutional: well-appearing, no acute distress sitting up in bed  Heart: RRR, no m/r/g  Lungs: fine crackles in bases RLL>LLL  Abd: non-distended  Ext: No significant edema

## 2023-01-01 NOTE — Unmapped (Signed)
Assessment: Karen Barber is a 57 y.o. female who was admitted for Acute on chronic hypoxic respiratory failure (CMS-HCC)       Shift Events: Patient remained A&Ox4, on 8L LBNC, SBP 80s-100s, NSR, and afebrile. One time desat to low 70s. Non-rebreather applied for a few minutes and then switched back to [facility] at 8L. No complaints of pain or nausea. No PRNs given. No BM. UO stated below. Moderate PO intake. Patient up in chair for majority of day. Pastoral care consulted for spiritual guidance. Rounds completed Q2 hours.         Current Hospital Length of Stay: 32 Days    INFUSIONS:      VITALS:  Vitals:    12/21/22 1100 12/26/22 1846   Weight: 87.7 kg (193 lb 5.5 oz) 86.6 kg (190 lb 14.7 oz)      Weight change:      Temp:  [35.9 ??C (96.6 ??F)-36.4 ??C (97.6 ??F)] 36.4 ??C (97.6 ??F)  Heart Rate:  [69-82] 75  SpO2 Pulse:  [70-83] 81  Resp:  [16-29] 17  BP: (80-102)/(50-69) 102/69  MAP (mmHg):  [57-79] 79  SpO2:  [86 %-100 %] 96 %     VENT/OXYGEN SETTINGS:       INS & OUTS:  Date 12/30/22 1901 - 12/31/22 0700 12/31/22 0701 - 01/01/23 0700   Shift 1901-0700 24 Hour Total 0701-1900 1901-0700 24 Hour Total   INTAKE   P.O. 60 460      Shift Total 60 460      OUTPUT   Urine 500 750 360  360   Shift Total 500 750 360  360     Intake/Output Summary (Last 24 hours) at 12/31/2022 1759  Last data filed at 12/31/2022 1630  Gross per 24 hour   Intake 60 ml   Output 860 ml   Net -800 ml        ACCESS:  Patient Lines/Drains/Airways Status       Active Active Lines, Drains, & Airways       Name Placement date Placement time Site Days    External Urinary Device 12/02/22 With Suction 12/02/22  1900  -- 28    Peripheral IV 12/11/22 Left;Posterior Forearm 12/11/22  1400  Forearm  20                           See flowsheets/MAR for further information      Virgia Land, RN  December 31, 2022 5:59 PM        Problem: Adult Inpatient Plan of Care  Goal: Plan of Care Review  Outcome: Ongoing - Unchanged  Goal: Patient-Specific Goal (Individualized)  Outcome: Ongoing - Unchanged  Goal: Absence of Hospital-Acquired Illness or Injury  Outcome: Ongoing - Unchanged  Intervention: Identify and Manage Fall Risk  Recent Flowsheet Documentation  Taken 12/31/2022 0800 by Virgia Land, RN  Safety Interventions:   aspiration precautions   bed alarm   bleeding precautions   fall reduction program maintained   commode/urinal/bedpan at bedside   lighting adjusted for tasks/safety   low bed   nonskid shoes/slippers when out of bed  Intervention: Prevent Skin Injury  Recent Flowsheet Documentation  Taken 12/31/2022 1600 by Virgia Land, RN  Positioning for Skin: Sitting in Chair  Taken 12/31/2022 1400 by Virgia Land, RN  Positioning for Skin: Sitting in Chair  Taken 12/31/2022 1200 by Virgia Land, RN  Positioning for Skin: Sitting  in Chair  Taken 12/31/2022 1000 by Virgia Land, RN  Positioning for Skin: Sitting in Chair  Taken 12/31/2022 0800 by Virgia Land, RN  Positioning for Skin: (sitting up in bed) Other (Comment)  Intervention: Prevent Infection  Recent Flowsheet Documentation  Taken 12/31/2022 0800 by Virgia Land, RN  Infection Prevention:   rest/sleep promoted   personal protective equipment utilized   hand hygiene promoted   equipment surfaces disinfected   single patient room provided  Goal: Optimal Comfort and Wellbeing  Outcome: Ongoing - Unchanged  Goal: Readiness for Transition of Care  Outcome: Ongoing - Unchanged  Goal: Rounds/Family Conference  Outcome: Ongoing - Unchanged     Problem: Fall Injury Risk  Goal: Absence of Fall and Fall-Related Injury  Outcome: Ongoing - Unchanged  Intervention: Promote Injury-Free Environment  Recent Flowsheet Documentation  Taken 12/31/2022 0800 by Virgia Land, RN  Safety Interventions:   aspiration precautions   bed alarm   bleeding precautions   fall reduction program maintained   commode/urinal/bedpan at bedside   lighting adjusted for tasks/safety   low bed nonskid shoes/slippers when out of bed     Problem: Gas Exchange Impaired  Goal: Optimal Gas Exchange  Outcome: Ongoing - Unchanged  Intervention: Optimize Oxygenation and Ventilation  Recent Flowsheet Documentation  Taken 12/31/2022 0800 by Virgia Land, RN  Head of Bed (HOB) Positioning: HOB at 60-90 degrees     Problem: Comorbidity Management  Goal: Blood Glucose Levels Within Targeted Range  Outcome: Ongoing - Unchanged  Goal: Blood Pressure in Desired Range  Outcome: Ongoing - Unchanged     Problem: Skin Injury Risk Increased  Goal: Skin Health and Integrity  Outcome: Ongoing - Unchanged  Intervention: Optimize Skin Protection  Recent Flowsheet Documentation  Taken 12/31/2022 1600 by Virgia Land, RN  Pressure Reduction Techniques: frequent weight shift encouraged  Taken 12/31/2022 1400 by Virgia Land, RN  Pressure Reduction Techniques: frequent weight shift encouraged  Taken 12/31/2022 1200 by Virgia Land, RN  Pressure Reduction Techniques: frequent weight shift encouraged  Taken 12/31/2022 1000 by Virgia Land, RN  Pressure Reduction Techniques: frequent weight shift encouraged  Taken 12/31/2022 0800 by Virgia Land, RN  Pressure Reduction Techniques: frequent weight shift encouraged  Head of Bed (HOB) Positioning: HOB at 60-90 degrees  Pressure Reduction Devices: pressure-redistributing mattress utilized

## 2023-01-01 NOTE — Unmapped (Signed)
Infectious Disease ([facility]) Progress Note    Assessment & Plan:   Karen Barber is a 57 y.o. female whose presentation is complicated by bronchiectasis type 1 in the setting of pulmonary sarcoidosis (6L [place] home O2), HTN, T2DM, COPD, s/p LUL lobectomy, and asthma that presented to [facility] admitted to MICU after becoming hypoxic after holding home meds prior to PET scan. After 1 week diuresing ICU stabilized patient she was transferred to the MPCU.    Principal Problem:    Acute on chronic hypoxic respiratory failure (CMS-HCC)  Active Problems:    Pulmonary sarcoidosis (CMS-HCC)    Type 2 diabetes mellitus with hyperglycemia (CMS-HCC)    COPD (chronic obstructive pulmonary disease) (CMS-HCC)    Chronic pulmonary aspergillosis (CMS-HCC)    Pulmonary hypertension (CMS-HCC)    AKI (acute kidney injury) (CMS-HCC)    Active Problems  #Acute on Chronic Hypoxemic Respiratory Failure -   #Pulmonary Sarcoidosis -   #COPD/asthma -   #Bronchiectasis  Patient has notable history of pulmonary sarcoidosis, COPD, pulmonary hypertension and is on baseline oxygen 6 L.  Recent admission with pneumonia growing Serratia marcescens  s/p 1x Rocephin (8/18), Vancomycin (8/18-8/19), Zosyn (8/18-8/23) discharged on levofloxacin 500 mg ending on 8/31.  Patient held her medications only for the morning prior to an outpatient PET CT scan during which she became hypoxic to the 70s and was admitted to the MICU.  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 She received one dose of 125mg  of IV methylprednisolone. She was weaned from high flow nasal cannula to large bore nasal cannula after IV diuresis and a course of antibiotics for possible pneumonia with Vanco/cefepime later transition to levofloxacin with a total 1 week treatment with antibiotics. Started on vanc/cefepime for broad coverage --> stopped vanc and cefepime 9/12- then levaquin (9/12---9/16).  Patient was transferred to the floor on 9/18 after showing significant signs of improvement but still having occasional desaturations with ambulation. PET scan 9/18 is not indicative of active pulmonary sarcoidosis.  Acute exacerbation likely multifactorial including in not taking her home medications as per directed for airway clearance, volume overload, and higher O2 requirement chronically than the amount she has been using. RHC 9/20 showing significant pulm HTN disease with mean PA pressure of 47, for which pulmonology team started Iloprost. Notably, wedge pressure on that study was 5, which is reassuring against volume as driver of her respiratory status at that time. Patient has been having variations in oxygen requirement.  Nephrology considering RHC, however pulmonology is against.  Continuing IV diuresis with goal of net -500 mL. Will trial oral Bumex to transition for IV diuresis.   -- Continue to wean O2 with goal saturations of 90-94% per pulmonology  -- Trialing PO Bumex 4 mg this morning with likely needing second dose afternoon.  -- Consulted palliative for goals of care conversation as well as symptom management.  -- Plan to get oxymizer for her while admitted. Will need oxymizer at home  -- Continue home airway clearance (DuoNebs and HTS 3 times daily with [place])  -- Continue home Pulmicort, Brovana, Atrovent, as needed albuterol, and azithromycin  -- PT and OT consult  -- Standing weights daily  -- Discussion with pulmonology and nephrology about utility of RHC for further management and prognostication information     #Pulmonary hypertension - RV failure - orthostatic hypotension  Patient has history of pulmonary hypertension due to pulmonary sarcoidosis/COPD.  TTE this admission 9/17 with severely dilated right atrium with severe pulmonary hypertension PASP  86 mmHg.  Estimated right atrial pressure 5 to 10 mmHg.  On Lasix 80 mg twice daily at home.  Echo showed LVEF greater than 55% and severe pulmonary hypertension no significant change since prior study.  Right heart cath showed severe pulmonary Hypertension, markedly elevated PVR (9.3 - 10.5) and low cardiac output / index. HRCT scan shows unchanged fibrotic pulmonary sarcoidosis with bilateral segmental and subsegmental fibrosis post left upper lobectomy with unchanged apical right upper lobe and superior segment left lower lobe bullae.   -- Continue inhaled iloprost 5 mcg 6 times a day q4h; please do not mix with other nebulizer solutions   -- Plan to transition to Tyvaso nebs, working with pharmacy and [facility]  -- [facility] rep to do teaching prior to DC    #Acute kidney injury on CKD3 (downtrending)  On admission patient's creatinine 1.3 which appears to be baseline. Cr fluctuating in 1.5-1.8 range. Cr improving with diuresis   -- Renally dose medications  -- Daily BMP    #Secondary Hyperparathyroidism, Hypocalcemia, and Hyperphosphatemia  Findings are consistent with secondary hyperparathyroidism due to CKD.  Ionized calcium: 4.37 mg/dL (low)  Phosphate: 5.6 mg/dL (elevated)  PTH: 811.9 pg/mL (elevated)  Vitamin D Total (25OH) 35  -- Reconsider need for future oral calcium carbonate 1600 mg TID  -- Reconsider need for future phoslo 667 mg PO TID   -- Reassess PTH levels in 4-6 weeks.    #Type 2 diabetes mellitus with hyperglycemia and hypoglycemia  Hemoglobin A1c 8.3%.  Home regimen includes glargine 18 units nightly and sliding scale insulin.  Large fluctuations in glucose greater than 300 and occasional values ~50.  -- Sliding scale insulin insulin sensitivity factor 40  --12 glargine nightly  -- 3 units lispro tid     #Left leg swelling (resolved )  Patient endorses intermittent left > right leg swelling. Venous doppler unremarkable.      Chronic Problems     #Chronic Pain   - Resuming gabapentin, though at reduced dose  - PT/OT consulted, appreciate recs     #Chronic pulmonary aspergillosis  Patient has history of aspergilloma and is on chronic posaconazole. Soluble IL-2 receptor, Aspergillus Galactomannan Antigen, IgE checked 10/2022 all wnl  -Continue home posaconazole Ppx  -Outpatient evaluation for [facility]AE scheduled for 10/25 to evaluate recurrent hemoptysis     #Iron deficiency anemia  #Chronic normocytic anemia (baseline hemoglobin 11)  Patient has some mild normocytic anemia with hemoglobin of 11. Iron panel shows iron of 37 with iron saturation of 15.   --Iron supplementation with iron dextran 1,000mg  in 250cc    #Hyperlipidemia  Last lipid profile showed an LDL of 146  -- Follow-up outpatient    Issues Impacting Complexity of Management:  -The patient is at high risk for the development of complications of volume overload due to the need to provide IV hydration for suspected hypovolemia in the setting of: Pulmonary hypertension  -Need for the following intensive monitoring parameter(s) due to high risk of clinical decline: continuous oxygen monitoring, telemetry, and frequent monitoring of urine output to assess for the efficacy of diuretic regimen  -Need for intensive oxygen therapy of HFNC, which places the patient at high risk for oxygen toxicity  -The patient is at high risk of complications from pulmonary hypertension    Daily Checklist:  Diet: Regular Diet   DVT PPx: Lovenox 40mg  q24h  Electrolytes: Replete Potassium to >/= 3.6 and Magnesium to >/= 1.8  Code Status: DNR and DNI  Dispo:  Continue MPCU    Team Contact Information:   Primary Team: Infectious Disease (MEDK)  Primary Resident: Zella Richer, MD  Resident's Pager: [phone] Evelene Croon Disease Intern - Tower)    Interval History:     No acute events overnight.     Patient reports no acute concerns or complaints this morning.  Feels that her shortness of breath is stable at rest, however continued desaturations with exertion.  Discussed plan for transition to oral diuresis with trial today.    ROS negative except for where otherwise noted above.    Objective:   Temp:  [36.3 ??C (97.4 ??F)-36.8 ??C (98.2 ??F)] 36.8 ??C (98.2 ??F)  Heart Rate:  [70-84] 78  SpO2 Pulse:  [70-83] 78  Resp:  [15-28] 28  BP: (70-102)/(51-69) 85/54  SpO2:  [85 %-98 %] 98 %,   Intake/Output Summary (Last 24 hours) at 01/01/2023 1220  Last data filed at 01/01/2023 1152  Gross per 24 hour   Intake 600 ml   Output 1135 ml   Net -535 ml   , O2 Device: Large bore nasal cannula (cool high flow)  O2 Flow Rate (L/min):  [7 L/min-8 L/min] 8 L/min    Gen: NAD, converses well in full sentences  HENT: Atraumatic, normocephalic  Heart: RRR,   Lungs: Decreased breath sounds bilaterally, no adventitious lung sounds, no increased work of breathing, no wheezing  Abdomen: Soft, nontender nondistended  Extremities: Stable lower extremity pedal edema

## 2023-01-01 NOTE — Unmapped (Signed)
Pulmonary Consult Service  Follow-Up Note      Primary Service:   Medicine - [facility]  Primary Service Attending:  Hulen Skains, MD  Reason for Consult:   Assistance with Diuresis, Pulmonary HTN    ASSESSMENT and PLAN     Karen Barber is a 57 y.o. 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 Pt was found to have acute on chronic respiratory failure (sat in 70s on home 6L) in the setting of not taking her home medications (sildenafil, lasix, nebs, inhalers) in preparation for PET scan, admitted 9/11. She was admitted to the ICU initially, and weaned back to 6-8L [place]  with airway clearance and diuresis and transitioned to MPCU. Her acute on chronic hypoxemia and increase O2 requirement was likely multifactorial -- combination of volume overload as well as lack of airway clearance at home, however I think chronically her O2 requirement (related to her PH) is higher than the amount she has been on at home and she continues to have severe exertional hypoxemia probably due to her parenchymal disease, at least in part.       Continues to have some challenging managing volume status and renal function. Overall, her oxygenation continues to fluctuate and is maybe a little worse (more consistently needing 8L vs prior 6-7) with rising BNP.     RV Failure - PAH (Group 3, 5): RHC 9/20 this admission demonstrated severe pulmonary hypertension, markedly elevated PVR (9.3-10.5) and low CO/CI. Ultimately started on Tyvaso shortly thereafter for her PH-ILD diagnosis. She remains in the hospital for worsening AKI and challenges managing her volume status, though creatinine seems to be improving with diuresis and repeat echo last week shows relatively similar findings. Unfortunately she has limited options with high baseline O2 requirement and severe lung disease and is not a transplant candidate.   - please resume her home AVAPS based on her chronic hypercarbic RF, this may provide some improvement and will give her a rest from [place] at night (reports having some epistaxis)  - Defer diuresis to nephrology  - Tyvaso teaching will take place when there is a confirmed discharge date. Patient does not currently have delivery device.  - Would defer RHC   - Continue inhaled iloprost 5 mcg q4h -> Tyvaso at discharge.   - Continue sildenafil 40 mg TID  - Continue daily weight with strict I/O  - cont daily pro-BNP  - please obtain daily weights    Pulmonary Sarcoidosis c/b Bronchiectasis  Soluble IL-2 receptor, Aspergillus Galactomannan Antigen, IgE checked 10/2022 all wnl. PET scan 9/17 does not appear to be active pulmonary sarcoidosis and again it is overall felt to be burned out from sarcoidosis standpoint. She is now stable satting at >95% on 8-10L via [facility]. It is likely she has higher O2 requirement chronically than the amount she has been using at home.   - O2 sat goal at 90-94%, wean oxygen as tolerated  - continue home pulmicort, brovana, PRN albuterol, azithromycin  - please cont airway clearance:              - duonebs TID and HTS (3%) BID with aerobika    Chronic Pulmonary Aspergillosis   Aspergillus Galactomannan Antigen, IgE checked 10/2022 all wnl.   - continue home posaconazole  Ms. Depaoli was seen, examined and discussed with  Dr. Judie Petit. Karen Barber   Thank you for involving [place] in her care. We look forward to following with you.  Please don't hesitate to page [place] with any questions or concerns at [phone] (pulmonary consult fellow).    Kandis Fantasia, MD  Pulmonary Critical Care Fellow     SUBJECTIVE:     No acute events overnight. Feels swelling is better. Breathing is stable. Interested in having AVAPS machine during this admission.     Medications:     Scheduled Meds:   acetaminophen  650 mg Oral Q6H    arformoterol  15 mcg Nebulization BID (RT)    azithromycin  250 mg Oral Daily    budesonide  0.5 mg Nebulization BID (RT)    diclofenac sodium  2 g Topical QID    enoxaparin (LOVENOX) injection  40 mg Subcutaneous Nightly    flu vacc [id number] 6mos up(PF)  0.5 mL Intramuscular During hospitalization    [Provider Hold] furosemide  80 mg Oral BID    gabapentin  100 mg Oral TID    iloprost  5 mcg Nebulization 6XD    insulin glargine  12 Units Subcutaneous Nightly    insulin lispro  0-20 Units Subcutaneous ACHS    insulin lispro  3 Units Subcutaneous 3xd Meals    ipratropium-albuterol  3 mL Nebulization Q6H (RT)    lidocaine  1 patch Transdermal Daily    [Provider Hold] magnesium oxide-Mg AA chelate  2 tablet Oral BID    posaconazole  200 mg Oral Daily    sildenafiL (pulm.hypertension)  40 mg Oral TID     Continuous Infusions:  PRN Meds:.albuterol, aluminum-magnesium hydroxide-simethicone, calcium carbonate, dextrose in water, glucagon, melatonin, ondansetron **OR** ondansetron, polyethylene glycol, prochlorperazine, senna, sodium chloride     PHYSICAL EXAM:   BP 85/54  - Pulse 78  - Temp 36.8 ??C (98.2 ??F) (Oral)  - Resp 28  - Ht 180.3 cm (5' 11)  - Wt 86.6 kg (190 lb 14.7 oz)  - LMP  (LMP Unknown)  - SpO2 98%  - BMI 26.63 kg/m??   General: NAD, alert sitting in bedside chair  HEENT: MMM, sclera anicteric   Cardiovascular: RRR  Pulmonary: comfortable on LBNC, bilateral crackles present predominantly in the bases   Musculoskeletal: trace LE edema  Skin: no lesions noted on clothed exam  Neuro: No focal neurological deficits.    LABORATORY and RADIOLOGY DATA:     Pertinent Laboratory Data from the last 24 hours:  Lab Results   Component Value Date    WBC 3.1 (L) 01/01/2023    HGB 11.4 01/01/2023    HCT 34.8 01/01/2023    PLT 171 01/01/2023     Lab Results   Component Value Date    NA 136 01/01/2023    K 5.3 (H) 01/01/2023    CL 95 (L) 01/01/2023    CO2 37.0 (H) 01/01/2023    BUN 60 (H) 01/01/2023    CREATININE 2.55 (H) 01/01/2023    GLU 93 01/01/2023    CALCIUM 10.2 01/01/2023    MG 3.0 (H) 01/01/2023    PHOS 5.3 (H) 12/25/2022       Lab Results   Component Value Date    BILITOT 0.7 12/25/2022    BILIDIR <0.10 05/30/2019    PROT 7.3 12/25/2022    ALBUMIN 3.6 12/25/2022    ALT 20 12/25/2022    AST 27 12/25/2022    ALKPHOS  91 12/25/2022    GGT 60 (H) 05/31/2011       Lab Results   Component Value Date    INR 1.02 12/11/2019    APTT 30.9 12/11/2019       Pertinent Micro Data:  Microbiology Results (last day)       ** No results found for the last 24 hours. **          Pertinent Imaging Data from the last 24 hours:  10/10 Limited Echo  Summary    1. The left ventricle is relatively small in size with normal wall  thickness.    2. The left ventricular systolic function is normal, LVEF is visually  estimated at 55-60%.    3. The aortic valve is trileaflet with moderately thickened leaflets with  normal excursion.    4. The right ventricle is severely dilated in size, with moderately reduced  systolic function.    5. There is moderate tricuspid regurgitation.    6. There is severe pulmonary hypertension.    7. The right atrium is severely dilated in size.    8. IVC size and inspiratory change suggest elevated right atrial pressure.  (10-20 mmHg).

## 2023-01-02 LAB — BASIC METABOLIC PANEL
ANION GAP: 4 mmol/L — ABNORMAL LOW (ref 5–14)
ANION GAP: 5 mmol/L (ref 5–14)
BLOOD UREA NITROGEN: 64 mg/dL — ABNORMAL HIGH (ref 9–23)
BLOOD UREA NITROGEN: 65 mg/dL — ABNORMAL HIGH (ref 9–23)
BUN / CREAT RATIO: 25
BUN / CREAT RATIO: 26
CALCIUM: 10.2 mg/dL (ref 8.7–10.4)
CALCIUM: 9.9 mg/dL (ref 8.7–10.4)
CHLORIDE: 95 mmol/L — ABNORMAL LOW (ref 98–107)
CHLORIDE: 96 mmol/L — ABNORMAL LOW (ref 98–107)
CO2: 39 mmol/L — ABNORMAL HIGH (ref 20.0–31.0)
CO2: 37 mmol/L — ABNORMAL HIGH (ref 20.0–31.0)
CREATININE: 2.51 mg/dL — ABNORMAL HIGH
CREATININE: 2.53 mg/dL — ABNORMAL HIGH
EGFR CKD-EPI (2021) FEMALE: 22 mL/min/{1.73_m2} — ABNORMAL LOW (ref >=60)
EGFR CKD-EPI (2021) FEMALE: 21 mL/min/{1.73_m2} — ABNORMAL LOW (ref >=60)
GLUCOSE RANDOM: 87 mg/dL (ref 70–179)
GLUCOSE RANDOM: 108 mg/dL (ref 70–179)
POTASSIUM: 5.6 mmol/L — ABNORMAL HIGH (ref 3.4–4.8)
POTASSIUM: 5.3 mmol/L — ABNORMAL HIGH (ref 3.4–4.8)
SODIUM: 138 mmol/L (ref 135–145)
SODIUM: 138 mmol/L (ref 135–145)

## 2023-01-02 LAB — PRO-BNP: PRO-BNP: 9355 pg/mL — ABNORMAL HIGH (ref <=300.0)

## 2023-01-02 LAB — CBC
HEMATOCRIT: 34.9 % (ref 34.0–44.0)
HEMOGLOBIN: 11.2 g/dL — ABNORMAL LOW (ref 11.3–14.9)
MEAN CORPUSCULAR HEMOGLOBIN CONC: 32.2 g/dL (ref 32.0–36.0)
MEAN CORPUSCULAR HEMOGLOBIN: 27.5 pg (ref 25.9–32.4)
MEAN CORPUSCULAR VOLUME: 85.3 fL (ref 77.6–95.7)
MEAN PLATELET VOLUME: 8.3 fL (ref 6.8–10.7)
PLATELET COUNT: 147 10*9/L — ABNORMAL LOW (ref 150–450)
RED BLOOD CELL COUNT: 4.09 10*12/L (ref 3.95–5.13)
RED CELL DISTRIBUTION WIDTH: 18.5 % — ABNORMAL HIGH (ref 12.2–15.2)
WBC ADJUSTED: 2.8 10*9/L — ABNORMAL LOW (ref 3.6–11.2)

## 2023-01-02 LAB — MAGNESIUM
MAGNESIUM: 2.8 mg/dL — ABNORMAL HIGH (ref 1.6–2.6)
MAGNESIUM: 3 mg/dL — ABNORMAL HIGH (ref 1.6–2.6)
MAGNESIUM: 2.8 mg/dL — ABNORMAL HIGH (ref 1.6–2.6)

## 2023-01-02 LAB — LACTATE, VENOUS, WHOLE BLOOD: LACTATE BLOOD VENOUS: 1.5 mmol/L (ref 0.5–1.8)

## 2023-01-02 MED ADMIN — budesonide (PULMICORT) nebulizer solution 0.5 mg: .5 mg | RESPIRATORY_TRACT | @ 01:00:00

## 2023-01-02 MED ADMIN — ipratropium-albuterol (DUO-NEB) 0.5-2.5 mg/3 mL nebulizer solution 3 mL: 3 mL | RESPIRATORY_TRACT | @ 19:00:00

## 2023-01-02 MED ADMIN — sildenafiL (pulm.hypertension) (REVATIO) tablet 40 mg: 40 mg | ORAL | @ 18:00:00

## 2023-01-02 MED ADMIN — ipratropium-albuterol (DUO-NEB) 0.5-2.5 mg/3 mL nebulizer solution 3 mL: 3 mL | RESPIRATORY_TRACT | @ 14:00:00

## 2023-01-02 MED ADMIN — melatonin tablet 6 mg: 6 mg | ORAL

## 2023-01-02 MED ADMIN — ipratropium-albuterol (DUO-NEB) 0.5-2.5 mg/3 mL nebulizer solution 3 mL: 3 mL | RESPIRATORY_TRACT | @ 07:00:00

## 2023-01-02 MED ADMIN — azithromycin (ZITHROMAX) tablet 250 mg: 250 mg | ORAL | @ 12:00:00 | Stop: 2025-08-25

## 2023-01-02 MED ADMIN — sildenafiL (pulm.hypertension) (REVATIO) tablet 40 mg: 40 mg | ORAL

## 2023-01-02 MED ADMIN — gabapentin (NEURONTIN) capsule 100 mg: 100 mg | ORAL | @ 18:00:00

## 2023-01-02 MED ADMIN — gabapentin (NEURONTIN) capsule 100 mg: 100 mg | ORAL | @ 12:00:00

## 2023-01-02 MED ADMIN — iloprost (VENTAVIS) nebulizer solution: 5 ug | RESPIRATORY_TRACT | @ 16:00:00

## 2023-01-02 MED ADMIN — furosemide (LASIX) injection 80 mg: 80 mg | INTRAVENOUS | @ 22:00:00 | Stop: 2023-01-02

## 2023-01-02 MED ADMIN — insulin glargine (LANTUS) injection 12 Units: 12 [IU] | SUBCUTANEOUS

## 2023-01-02 MED ADMIN — diclofenac sodium (VOLTAREN) 1 % gel 2 g: 2 g | TOPICAL | @ 20:00:00

## 2023-01-02 MED ADMIN — iloprost (VENTAVIS) nebulizer solution: 5 ug | RESPIRATORY_TRACT | @ 05:00:00

## 2023-01-02 MED ADMIN — iloprost (VENTAVIS) nebulizer solution: 5 ug | RESPIRATORY_TRACT | @ 13:00:00

## 2023-01-02 MED ADMIN — arformoterol (BROVANA) nebulizer solution 15 mcg/2 mL: 15 ug | RESPIRATORY_TRACT | @ 12:00:00

## 2023-01-02 MED ADMIN — gabapentin (NEURONTIN) capsule 100 mg: 100 mg | ORAL

## 2023-01-02 MED ADMIN — ipratropium-albuterol (DUO-NEB) 0.5-2.5 mg/3 mL nebulizer solution 3 mL: 3 mL | RESPIRATORY_TRACT | @ 01:00:00

## 2023-01-02 MED ADMIN — insulin lispro (HumaLOG) injection 3 Units: 3 [IU] | SUBCUTANEOUS | @ 20:00:00

## 2023-01-02 MED ADMIN — diclofenac sodium (VOLTAREN) 1 % gel 2 g: 2 g | TOPICAL | @ 17:00:00

## 2023-01-02 MED ADMIN — lidocaine (ASPERCREME) 4 % 1 patch: 1 | TRANSDERMAL | @ 12:00:00

## 2023-01-02 MED ADMIN — arformoterol (BROVANA) nebulizer solution 15 mcg/2 mL: 15 ug | RESPIRATORY_TRACT | @ 01:00:00

## 2023-01-02 MED ADMIN — iloprost (VENTAVIS) nebulizer solution: 5 ug | RESPIRATORY_TRACT | @ 19:00:00

## 2023-01-02 MED ADMIN — iloprost (VENTAVIS) nebulizer solution: 5 ug | RESPIRATORY_TRACT | @ 01:00:00

## 2023-01-02 MED ADMIN — sildenafiL (pulm.hypertension) (REVATIO) tablet 40 mg: 40 mg | ORAL | @ 12:00:00

## 2023-01-02 MED ADMIN — insulin lispro (HumaLOG) injection 3 Units: 3 [IU] | SUBCUTANEOUS | @ 15:00:00

## 2023-01-02 MED ADMIN — metOLazone (ZAROXOLYN) tablet 5 mg: 5 mg | ORAL | @ 22:00:00 | Stop: 2023-01-02

## 2023-01-02 MED ADMIN — budesonide (PULMICORT) nebulizer solution 0.5 mg: .5 mg | RESPIRATORY_TRACT | @ 12:00:00

## 2023-01-02 MED ADMIN — iloprost (VENTAVIS) nebulizer solution: 5 ug | RESPIRATORY_TRACT | @ 22:00:00

## 2023-01-02 MED ADMIN — acetaminophen (TYLENOL) tablet 650 mg: 650 mg | ORAL | @ 08:00:00

## 2023-01-02 MED ADMIN — posaconazole (NOXAFIL) delayed released tablet 200 mg: 200 mg | ORAL | @ 12:00:00

## 2023-01-02 MED ADMIN — acetaminophen (TYLENOL) tablet 650 mg: 650 mg | ORAL | @ 20:00:00

## 2023-01-02 MED ADMIN — enoxaparin (LOVENOX) syringe 40 mg: 40 mg | SUBCUTANEOUS

## 2023-01-02 MED ADMIN — acetaminophen (TYLENOL) tablet 650 mg: 650 mg | ORAL | @ 13:00:00

## 2023-01-02 MED ADMIN — insulin lispro (HumaLOG) injection 0-20 Units: 0-20 [IU] | SUBCUTANEOUS

## 2023-01-02 NOTE — Unmapped (Signed)
Pulmonary Consult Service  Follow-Up Note      Primary Service:   Medicine - MedK  Primary Service Attending:  Hulen Skains, MD  Reason for Consult:   Assistance with Diuresis, Pulmonary HTN    ASSESSMENT and PLAN     Karen Barber is a 57 y.o. 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 Pt was found to have acute on chronic respiratory failure (sat in 70s on home 6L) in the setting of not taking her home medications (sildenafil, lasix, nebs, inhalers) in preparation for PET scan, admitted 9/11. She was admitted to the ICU initially, and weaned back to 6-8L [place]  with airway clearance and diuresis and transitioned to MPCU. Her acute on chronic hypoxemia and increase O2 requirement was likely multifactorial -- combination of volume overload as well as lack of airway clearance at home, however I think chronically her O2 requirement (related to her PH) is higher than the amount she has been on at home and she continues to have severe exertional hypoxemia probably mostly related to her parenchymal disease.      We have been struggling to strike a balance with diuresis for her with overall poor renal function (eGFR 15) and pHTN. She seems fairly stably increased in her O2 needs to ~7-8L [facility]. There is not much more to offer her in terms of PAH therapies but will discuss with her primary pulmonologist this week.     RV Failure - PAH (Group 3, 5): RHC 9/20 this admission demonstrated severe pulmonary hypertension, markedly elevated PVR (9.3-10.5) and low CO/CI. Ultimately started on Tyvaso shortly thereafter for her PH-ILD diagnosis. She remains in the hospital for worsening AKI and challenges managing her volume status; creatinine initially improving with diuresis (now seems to have plateaued) and repeat echo last week shows relatively similar findings. Unfortunately she has limited options with high baseline O2 requirement and severe lung disease and is not a transplant candidate.   - please continue home AVAPS based on her chronic hypercarbic RF  - Defer diuresis to nephrology  - Tyvaso teaching will take place when there is a confirmed discharge date. Patient does not currently have delivery device.  - Continue inhaled iloprost 5 mcg q4h -> Tyvaso at discharge.   - Continue sildenafil 40 mg TID  - Continue daily weight with strict I/O  - cont monitoring pro-BNP, can be every other day  - please obtain daily weights    Pulmonary Sarcoidosis c/b Bronchiectasis  Soluble IL-2 receptor, Aspergillus Galactomannan Antigen, IgE checked 10/2022 all wnl. PET scan 9/17 does not appear to be active pulmonary sarcoidosis and again it is overall felt to be burned out from sarcoidosis standpoint. She is now stable satting at >95% on 8-10L via [facility]. It is likely she has higher O2 requirement chronically than the amount she has been using at home.   - O2 sat goal at 90-94%, wean oxygen as tolerated  - continue home pulmicort, brovana, PRN albuterol, azithromycin  - please cont airway clearance:              - duonebs TID and HTS (3%) BID with aerobika    Chronic Pulmonary Aspergillosis   Aspergillus Galactomannan Antigen, IgE checked 10/2022 all wnl.   - continue home posaconazole  Karen Barber was seen, examined and discussed with  Dr. Judie Petit. [name]   Thank you for involving [place] in her care. We look forward to following with you.  Please don't hesitate to page [place] with any questions or concerns at [phone] (pulmonary consult fellow).    Kandis Fantasia, MD  Pulmonary Critical Care Fellow     SUBJECTIVE:     On AVAPS overnight, reports that this was tough for the first 59-54min but after that, rested well and able to be on 7L today instead of the 8L she was on yesterday. Leg swelling somewhat improved. Medications:     Scheduled Meds:   acetaminophen  650 mg Oral Q6H    arformoterol  15 mcg Nebulization BID (RT)    azithromycin  250 mg Oral Daily    budesonide  0.5 mg Nebulization BID (RT)    diclofenac sodium  2 g Topical QID    enoxaparin (LOVENOX) injection  40 mg Subcutaneous Nightly    flu vacc [id number] 6mos up(PF)  0.5 mL Intramuscular During hospitalization    [Provider Hold] furosemide  80 mg Oral BID    gabapentin  100 mg Oral TID    iloprost  5 mcg Nebulization 6XD    insulin glargine  12 Units Subcutaneous Nightly    insulin lispro  0-20 Units Subcutaneous ACHS    insulin lispro  3 Units Subcutaneous 3xd Meals    ipratropium-albuterol  3 mL Nebulization Q6H (RT)    lidocaine  1 patch Transdermal Daily    [Provider Hold] magnesium oxide-Mg AA chelate  2 tablet Oral BID    posaconazole  200 mg Oral Daily    sildenafiL (pulm.hypertension)  40 mg Oral TID     Continuous Infusions:  PRN Meds:.albuterol, aluminum-magnesium hydroxide-simethicone, calcium carbonate, dextrose in water, glucagon, melatonin, ondansetron **OR** ondansetron, polyethylene glycol, prochlorperazine, senna, sodium chloride     PHYSICAL EXAM:   BP 99/74  - Pulse 73  - Temp 35.9 ??C (96.6 ??F) (Axillary)  - Resp 25  - Ht 180.3 cm (5' 11)  - Wt 86.6 kg (190 lb 14.7 oz)  - LMP  (LMP Unknown)  - SpO2 98%  - BMI 26.63 kg/m??   General: NAD, alert sitting up in bed   HEENT: MMM, wearing [place]  Cardiovascular: RRR  Pulmonary: comfortable respirations on LBNC, coarse crackles bilaterally mostly in the bases R>L, diminished in the apices  Musculoskeletal: trace LE edema similar to prior  Skin: no lesions noted on clothed exam  Neuro: No focal neurological deficits.    LABORATORY and RADIOLOGY DATA:     Pertinent Laboratory Data from the last 24 hours:  Lab Results   Component Value Date    WBC 3.1 (L) 01/01/2023    HGB 11.4 01/01/2023    HCT 34.8 01/01/2023    PLT 171 01/01/2023     Lab Results   Component Value Date    NA 136 01/01/2023    K 5.3 (H) 01/01/2023    CL 95 (L) 01/01/2023    CO2 37.0 (H) 01/01/2023    BUN 60 (H) 01/01/2023    CREATININE 2.55 (H) 01/01/2023    GLU 93 01/01/2023    CALCIUM 10.2 01/01/2023    MG 2.8 (H) 01/01/2023    PHOS 5.3 (H) 12/25/2022       Lab Results   Component Value Date    BILITOT 0.7 12/25/2022    BILIDIR <0.10 05/30/2019    PROT 7.3 12/25/2022  ALBUMIN 3.6 12/25/2022    ALT 20 12/25/2022    AST 27 12/25/2022    ALKPHOS 91 12/25/2022    GGT 60 (H) 05/31/2011       Lab Results   Component Value Date    INR 1.02 12/11/2019    APTT 30.9 12/11/2019       Pertinent Micro Data:  Microbiology Results (last day)       ** No results found for the last 24 hours. **          Pertinent Imaging Data from the last 24 hours:  10/10 Limited Echo  Summary    1. The left ventricle is relatively small in size with normal wall  thickness.    2. The left ventricular systolic function is normal, LVEF is visually  estimated at 55-60%.    3. The aortic valve is trileaflet with moderately thickened leaflets with  normal excursion.    4. The right ventricle is severely dilated in size, with moderately reduced  systolic function.    5. There is moderate tricuspid regurgitation.    6. There is severe pulmonary hypertension.    7. The right atrium is severely dilated in size.    8. IVC size and inspiratory change suggest elevated right atrial pressure.  (10-20 mmHg).

## 2023-01-02 NOTE — Unmapped (Addendum)
Patient is A&O x 4. VSS on 7L LBNC and AVAPS while sleeping. No complaints of pain. Adequate urine output. No BM overnight. Glycemic management maintained. Free from falls and injury. Call bell within reach. See flowsheets/MAR for further info.    Problem: Adult Inpatient Plan of Care  Goal: Readiness for Transition of Care  Outcome: Ongoing - Unchanged     Problem: Gas Exchange Impaired  Goal: Optimal Gas Exchange  Outcome: Ongoing - Unchanged  Intervention: Optimize Oxygenation and Ventilation  Recent Flowsheet Documentation  Taken 01/02/2023 0400 by Otis Dials, RN  Head of Bed [facility]) Positioning: HOB at 30-45 degrees  Taken 01/02/2023 0200 by Otis Dials, RN  Head of Bed [facility]) Positioning: HOB at 30-45 degrees  Taken 01/02/2023 0000 by Otis Dials, RN  Head of Bed [facility]) Positioning: HOB at 30-45 degrees  Taken 01/01/2023 2200 by Otis Dials, RN  Head of Bed [facility]) Positioning: HOB at 30-45 degrees  Taken 01/01/2023 2000 by Otis Dials, RN  Head of Bed [facility]) Positioning: HOB at 30-45 degrees     Problem: Comorbidity Management  Goal: Blood Pressure in Desired Range  Outcome: Ongoing - Unchanged

## 2023-01-02 NOTE — Unmapped (Signed)
Spiritual Care Visit Note    Assessment Summary: Visited with Karen Barber who was open and receptive to care.  Karen Barber expressed being angry with the physician who prescribed they medicine that led to her developing pneumonia and with the multiple attempts it takes to get blood drawn currently  Her spiritual orienting system sees her condition as a spiritual attack.  She expressed wanting to go outside.  Informed her she could make that request with her care team.  Also, I followed up with her nurse to let him know that she would like to go out.      Clinical Encounter Type  Type of Visit: Initial visit  Care Provided To: Patient  Referral Source: Physician  On-Call Visit?: Yes  Reason for Visit: Routine spiritual support  Minutes Spent: 20 minutes       Spiritual Assessment  Faith: [employer]  Presenting Concern(s): Anger, Concerns about relationship with God, Grief and loss, Extended Length of Stay, Recent/Multiple admissions  Beliefs: the doctor gave me the medicine that caused this stay  Emotions: anger  Community: Family, Friend(s)  Needs: Go outside  Hopes: Fully recover  Resources: faith, family, friends, care team, pastoral care    Spiritual Care Interventions  Interventions Made: Established relationship of care and support, Compassionate presence, Reflective listening, Supported patient's sources of spiritual strength, Explored feelings, Use of storytelling, Prayer, Non-anxious presence  Outcomes: Appreciated Chaplain visit, Open to continued care      Spiritual Care Plan  Spiritual Care Plan: No active spiritual needs at this time. Please consult if new needs emerge.       Signed: Cheryle Horsfall, Chaplain 5:58 PM 01/01/2023

## 2023-01-02 NOTE — Unmapped (Signed)
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  On 10/7 began rising after diuresis of 2.7 L of urine on 10/6, and creatinine up to 2.95 at the time of consult.  DDx of prerenal from relative volume depletion, cardiorenal given narrow threshold for forward flow, congestion though suspect this is not the case in the setting of good urine output and rising CO2 despite BNP's rising over the last few days.  Volume status exam is extremely challenging in the setting of her pulmonary hypertension but fortunately has stabilized and improved after gentle diuresis yesterday favoring cardiorenal congestive AKI rather than the opposite with improvement in her BNP. This reinforces a very narrow window of euvolemia  - Sediment 10/9 bland  - Pt oxygenation slightly above baseline at 7 L from home 6L.   - weight relatively unchanged since coming to ED 85-87 kg  - Plan to diurese today with metolazone 5 mg and furosemide 80 mg IV  - plan to switch to oral furosemide tomorrow.     # Pulmonary sarcoidosis complicated by pulmonary hypertension on chronic oxygen  - Evaluation and management per primary team  - No changes to management from a nephrology standpoint at this time    RECOMMENDATIONS:   - Diurese to goal of net negative 0.5-1L if monitoring ins appropriately  - plan for diurese today with metolazone 5 mg and furosemide 80 mg IV  Strict I and O  Recc daily weight  - We will continue to follow.     [name], MD  01/02/2023 4:30 PM     Medical decision-making for 01/02/23  Findings / Data     Patient has: []  acute illness w/systemic sxs  [mod]  []  two or more stable chronic illnesses [mod]  []  one chronic illness with acute exacerbation [mod]  []  acute complicated illness  [mod]  []  Undiagnosed new problem with uncertain prognosis  [mod] [x]  illness posing risk to life or bodily function (ex. AKI)  [high]  []  chronic illness with severe exacerbation/progression  [high]  []  chronic illness with severe side effects of treatment  [high] AKI Probs At least 2:  Probs, Data, Risk   I reviewed: [x]  primary team note  [x]  consultant note(s)  []  external records [x]  chemistry results  []  CBC results  []  blood gas results  []  Other []  procedure/op note(s)   []  radiology report(s)  []  micro result(s)  []  w/ independent historian(s) Pulm notes reviewed, Cr 2.6 stable; diuresing >=3 Data Review (2 of 3)    I independently interpreted: []  Urine Sediment  []  Renal US []  CXR Images  []  CT Images  []  Other []  EKG Tracing  Any     I discussed: []  Pathology results w/ QHPs(s) from other specialties  []  Procedural findings w/ QHPs(s) from other specialties []  Imaging w/ QHP(s) from other specialties  [x]  Treatment plan w/ QHP(s) from other specialties Plan discussed with primary team Any     Mgm't requires: []  Prescription drug(s)  [  mod]  []  Kidney biopsy  [mod]  []  Central line placement  [mod] []  High risk medication use and/or intensive toxicity monitoring [high]  []  Renal replacement therapy [high]  []  High risk kidney biopsy  [high]  []  Escalation of care  [high]  []  High risk central line placement  [high] IV Diuresis: check BMP/Mg  Risk      ____________________________________________________  Interval Hx  Pt is in great spirits. She states she feels better but is unable to describe why.   Denies CP, SOB, N, V  She is concerned dthat she has gained weight since being in the hospital  She states she drinks about 1L of water daily (crystal light) and is very careful about additional intake     History of Present Illness: Karen Barber is 57 y.o. 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 Nephrology has been consulted for AKI.     Patient was initially admitted 9/11 when she presented for a PET scan to evaluate her sarcoidosis but unfortunately was found to be hypoxemic on her home 6 L of nasal cannula requiring high flow nasal cannula and was admitted initially to the ICU.  She was diuresed and her oxygenation improved and she was stepped down to the MPCU.  She was treated with antibiotics as well as steroids for infection and bronchiectasis.  She underwent right heart cath on 9/20 with elevated mean PA pressures and was started on iloprost.  Notes that she has not been told she has kidney issues prior to this as she regularly follows up with a PCP.  She notes that she is not eating or drinking very well as she is worried about accumulating fluid whenever she eats salt and she does not like the food here.  Denies nausea.    Lasix dosing over the last few days has included 80 mg IV once or twice twice a day till 10/6, with diuresis holiday 10/7 and then given 60 mg IV Lasix on 10/8.    Patient does note that she is urinating less over the last few days and has to push a little bit to empty her bladder at the end.    Of note O2 requirement is gradually increasing from baseline of 6 L nasal cannula on 10/6 and 10/7 up to 9 and up to 10 L on day of consult.  Urine output of 300 up to 700 today.  Curiously proBNP has been rising the last few days though team has felt she is relatively euvolemic during this time.    Creatinine recently has steady down in the 1.6-1.7 range however starting 10/7 rising up and up to 2.95 on day of consult    INPATIENT MEDICATIONS:    Current Facility-Administered Medications:     acetaminophen (TYLENOL) tablet 650 mg, Oral, Q6H    albuterol 2.5 mg /3 mL (0.083 %) nebulizer solution 2.5 mg, Nebulization, Q6H PRN    aluminum-magnesium hydroxide-simethicone (MAALOX MAX) 80-80-8 mg/mL oral suspension, Oral, Q6H PRN    arformoterol (BROVANA) nebulizer solution 15 mcg/2 mL, Nebulization, BID (RT)    azithromycin (ZITHROMAX) tablet 250 mg, Oral, Daily    budesonide (PULMICORT) nebulizer solution 0.5 mg, Nebulization, BID (RT)    calcium carbonate (TUMS)  chewable tablet 200 mg elem calcium, Oral, TID PRN    dextrose (D10W) 10% bolus 125 mL, Intravenous, Q10 Min PRN    diclofenac sodium (VOLTAREN) 1 % gel 2 g, Topical, QID    enoxaparin (LOVENOX) syringe 40 mg, Subcutaneous, Nightly    flu vaccine TS 2024-25(55mos up)(PF)(FLULAVAL, FLUARIX, FLUZONE), Intramuscular, During hospitalization    [Provider Hold] furosemide (LASIX) tablet 80 mg, Oral, BID    gabapentin (NEURONTIN) capsule 100 mg, Oral, TID    glucagon injection 1 mg, Intramuscular, Once PRN    iloprost (VENTAVIS) nebulizer solution, Nebulization, 6XD    insulin glargine (LANTUS) injection 12 Units, Subcutaneous, Nightly    insulin lispro (HumaLOG) injection 0-20 Units, Subcutaneous, ACHS    insulin lispro (HumaLOG) injection 3 Units, Subcutaneous, 3xd Meals    ipratropium-albuterol (DUO-NEB) 0.5-2.5 mg/3 mL nebulizer solution 3 mL, Nebulization, Q6H (RT)    lidocaine (ASPERCREME) 4 % 1 patch, Transdermal, Daily    [Provider Hold] magnesium oxide-Mg AA chelate (Magnesium Plus Protein) 2 tablet, Oral, BID    melatonin tablet 6 mg, Oral, Nightly PRN    ondansetron (ZOFRAN-ODT) disintegrating tablet 4 mg, Oral, Q8H PRN **OR** ondansetron (ZOFRAN) injection 4 mg, Intravenous, Q8H PRN    polyethylene glycol (MIRALAX) packet 17 g, Oral, Daily PRN    posaconazole (NOXAFIL) delayed released tablet 200 mg, Oral, Daily    prochlorperazine (COMPAZINE) tablet 5 mg, Oral, Q6H PRN    senna (SENOKOT) tablet 2 tablet, Oral, Nightly PRN    sildenafiL (pulm.hypertension) (REVATIO) tablet 40 mg, Oral, TID    sodium chloride (OCEAN) 0.65 % nasal spray 1 spray, Each Nare, Q6H PRN    OUTPATIENT MEDICATIONS:  Prior to Admission medications    Medication Dose, Route, Frequency   ACCU-CHEK GUIDE GLUCOSE METER Misc 1 Device, Other, 4 times a day (ACHS), AS DIRECTED   albuterol 2.5 mg /3 mL (0.083 %) nebulizer solution 2.5 mg, Nebulization, Every 6 hours PRN   albuterol HFA 90 mcg/actuation inhaler 2 puffs, Inhalation, Every 4 hours PRN   arformoterol (BROVANA) 15 mcg/2 mL 15 mcg, Nebulization, 2 times daily (RT)   aspirin-caffeine (BAYER BACK AND BODY) 500-32.5 mg Tab Oral, Takes 2 1/2 pills 3-4 times a day as needed.   azithromycin (ZITHROMAX) 250 MG tablet 250 mg, Oral, Every 24 hours, Start on 9/1 after completing the levofloxacin   blood sugar diagnostic Strp Other, 3 times a day (standard), ACCU-CHEK Guide meter   budesonide (PULMICORT) 0.5 mg/2 mL nebulizer solution 0.5 mg, Nebulization, 2 times daily (RT)   furosemide (LASIX) 80 MG tablet 80 mg, Oral, 2 times a day (standard), Take a third dose as needed for increasing weight or increasing swelling.   gabapentin (NEURONTIN) 300 MG capsule 300 mg, Oral, 3 times a day (standard)   insulin aspart (NOVOLOG U-100 INSULIN ASPART) 100 unit/mL vial 1-20 Units, Subcutaneous, 4 times a day (ACHS), SLIDING SCALE   insulin glargine (BASAGLAR KWIKPEN U-100 INSULIN) 100 unit/mL (3 mL) injection pen 18 Units, Subcutaneous, Nightly   ipratropium (ATROVENT) 0.02 % nebulizer solution 500 mcg, Nebulization, 4 times a day   posaconazole (NOXAFIL) 100 mg delayed released tablet 200 mg, Oral, Daily (standard)   sildenafiL, pulm.hypertension, (REVATIO) 20 mg tablet 20 mg, Oral, 3 times a day (standard)   OXYGEN-AIR DELIVERY SYSTEMS MISC Dose: 2-3 LPM        ALLERGIES:  Patient has no known allergies.    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 A.niger from sputum 08/2016-09/2016.    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    Sarcoidosis 1995    Type 2 diabetes mellitus with hyperglycemia (CMS-HCC) 07/27/2014    Visual impairment     glasses     Past Surgical History:   Procedure Laterality Date    LUNG LOBECTOMY      PR AMPUTATION TOE,MT-P JT Right 12/12/2019    Procedure: Right foot fifth toe amputation at the metatarsophalangeal joint level;  Surgeon: Britt Bottom, DPM;  Location: MAIN OR [facility];  Service: Vascular    PR AMPUTATION TOE,MT-P JT Right 06/16/2020    Procedure: Right foot amputation of 3rd toe at mpj level;  Surgeon: Britt Bottom, DPM;  Location: MAIN OR [facility];  Service: Vascular    PR RIGHT HEART CATH O2 SATURATION & CARDIAC OUTPUT N/A 04/29/2019    Procedure: Right Heart Catheterization;  Surgeon: Rosana Hoes, MD;  Location: [facility] CATH;  Service: Cardiology    PR RIGHT HEART CATH O2 SATURATION & CARDIAC OUTPUT N/A 12/08/2022    Procedure: Right Heart Catheterization;  Surgeon: Neal Dy, MD;  Location: [facility] CATH;  Service: Cardiology    PR THORACOSCOPY SURG TOT PULM DECORT Left 12/14/2014    Procedure: THORACOSCOPY SURG; W/TOT PULM DECORTIC/PNEUMOLYS;  Surgeon: Evert Kohl, MD; Location: MAIN OR [facility];  Service: Cardiothoracic    PR THORACOSCOPY W/THERA WEDGE RESEXN INITIAL UNILAT Left 12/03/2014    Procedure: THORACOSCOPY, SURGICAL; WITH THERAPEUTIC WEDGE RESECTION (EG, MASS, NODULE) INITIAL UNILATERAL;  Surgeon: Evert Kohl, MD;  Location: MAIN OR [facility];  Service: Cardiothoracic     SOCIAL HISTORY  Social History     Social History Narrative    Not on file      reports that she quit smoking about 35 years ago. Her smoking use included cigarettes. She started smoking about 44 years ago. She has a 4.5 pack-year smoking history. She has never used smokeless tobacco. She reports that she does not drink alcohol and does not use drugs.   FAMILY HISTORY  Family History   Problem Relation Age of Onset    Dementia Mother     Diabetes Father     Diabetes Brother     Diabetes Maternal Aunt     Sarcoidosis Maternal Aunt     Diabetes Maternal Uncle     Diabetes Paternal Aunt     Diabetes Paternal Uncle         Physical Exam:   Vitals:    01/02/23 0829 01/02/23 0830 01/02/23 1111 01/02/23 1600   BP: 76/55 85/59 103/76 100/58   Pulse: 69 70 96 84   Resp: 19 21 22 20    Temp:  36.4 ??C (97.5 ??F) 36.4 ??C (97.6 ??F)    TempSrc:  Oral Oral    SpO2:  93% 93% 93% 92%   Weight:  87.5 kg (192 lb 14.4 oz)     Height:         I/O this shift:  In: 580 [P.O.:580]  Out: -     Intake/Output Summary (Last 24 hours) at 01/02/2023 1630  Last data filed at 01/02/2023 1111  Gross per 24 hour   Intake 580 ml   Output 950 ml   Net -370 ml     Constitutional: well-appearing, no acute distress sitting up in bed  Heart: RRR, no m/r/g  Lungs: little to no crackle sin lung bases  Abd: non-distended  Ext: No significant edema

## 2023-01-02 NOTE — Unmapped (Signed)
Infectious Disease ([facility]) Progress Note    Assessment & Plan:   Karen Barber is a 57 y.o. female whose presentation is complicated by bronchiectasis type 1 in the setting of pulmonary sarcoidosis (6L [place] home O2), HTN, T2DM, COPD, s/p LUL lobectomy, and asthma that presented to [facility] admitted to MICU after becoming hypoxic after holding home meds prior to PET scan. After 1 week diuresing ICU stabilized patient she was transferred to the MPCU.    Principal Problem:    Acute on chronic hypoxic respiratory failure (CMS-HCC)  Active Problems:    Pulmonary sarcoidosis (CMS-HCC)    Type 2 diabetes mellitus with hyperglycemia (CMS-HCC)    COPD (chronic obstructive pulmonary disease) (CMS-HCC)    Chronic pulmonary aspergillosis (CMS-HCC)    Pulmonary hypertension (CMS-HCC)    AKI (acute kidney injury) (CMS-HCC)    Active Problems  #Acute on Chronic Hypoxemic Respiratory Failure -   #Pulmonary Sarcoidosis -   #COPD/asthma -   #Bronchiectasis  Baseline oxygen 6L.  Recent admission with pneumonia growing Serratia marcescens. Patient held her morning medications prior to outpatient PET CT scan during which she became hypoxic to the 70s and admitted to MICU.  CXR consistent with extensive pulmonary fibrosis. She was weaned from high flow nasal cannula to large bore nasal cannula after IV diuresis and antibiotics. PET scan 9/18 is not indicative of active pulmonary sarcoidosis. RHC 9/20 showing significant pulm HTN disease with mean PA pressure of 47, for which pulmonology team started Iloprost. Notably, wedge pressure on that study was 5, which is reassuring against volume as driver of her respiratory status at that time. Patient has been having variations in oxygen requirement.  Continuing IV diuresis with goal of net -500 mL.   -- Continue to wean O2 with goal saturations of 90-94% per pulmonology  -- Minimal UOP with oral Bumex  -- Net negative 1L yesterday with 80 mg IV Lasix  -- Consulted palliative for goals of care conversation as well as symptom management.  -- Plan to get oxymizer for her while admitted. Will need oxymizer at home  -- Continue home airway clearance (DuoNebs and HTS 3 times daily with [place])  -- Continue home Pulmicort, Brovana, Atrovent, as needed albuterol, and azithromycin  -- PT/OT: 5xL  -- Standing weights daily  -- Following nephrology recs regarding diuresis     #Pulmonary hypertension - RV failure - Orthostatic hypotension  History of pulmonary hypertension secondary to pulmonary sarcoidosis/COPD.  TTE this admission with severely dilated right atrium with severe pulmonary hypertension PASP 86 mmHg.  Estimated right atrial pressure 5 to 10 mmHg.  On Lasix 80 mg twice daily at home.  Echo showed LVEF > 55% and severe pulmonary hypertension without significant change from prior.  Right heart cath showed severe pulmonary hypertension, markedly elevated PVR (9.3 - 10.5) and low cardiac output / index. HRCT scan shows unchanged fibrotic pulmonary sarcoidosis with bilateral segmental and subsegmental fibrosis post left upper lobectomy with unchanged apical right upper lobe and superior segment left lower lobe bullae.   -- Continue inhaled iloprost 5 mcg 6 times a day q4h; please do not mix with other nebulizer solutions   -- Plan to transition to Tyvaso nebs, working with pharmacy and [facility] nurse coordinator  -- [facility] rep to do teaching prior to DC    #Hyperkalemia  Potassium 5.6  - EKG reassuring  - CTM    #Acute kidney injury on CKD3 (downtrending)  Creatinine 1.3 on admission which seems to be baseline. Hospital  course with rise of creatinine up to 3.   -- Creatinine 2.51 today  -- Renally dose medications  -- Daily BMP    #Secondary Hyperparathyroidism, Hypocalcemia, and Hyperphosphatemia  Secondary hyperparathyroidism due to CKD.  -- Reconsider need for future oral calcium carbonate 1600 mg TID  -- Reconsider need for future phoslo 667 mg PO TID   -- Reassess PTH levels in 4-6 weeks. Last checked 9/22.    #Type 2 diabetes mellitus with hyperglycemia and hypoglycemia  Hemoglobin A1c 8.3%.  Home regimen includes glargine 18 units nightly and sliding scale insulin.  Large fluctuations in glucose greater than 300 and occasional values ~50.  -- Sliding scale insulin insulin sensitivity factor 40  --12 glargine nightly  -- 3 units lispro tid      Chronic Problems     #Chronic Pain   - Gabapentin resumed at reduced dose     #Chronic pulmonary aspergillosis  On chronic posaconazole. Soluble IL-2 receptor, Aspergillus Galactomannan Antigen, IgE checked 10/2022 all wnl  -Continue home posaconazole Ppx  -Outpatient evaluation for [facility] scheduled for 10/25 to evaluate recurrent hemoptysis     #Iron deficiency anemia  #Chronic normocytic anemia (baseline hemoglobin 11)  --Iron supplementation with iron dextran 1,000mg  in 250cc    #Hyperlipidemia  LDL of 146  -- Follow-up outpatient    Issues Impacting Complexity of Management:  -The patient is at high risk for the development of complications of volume overload due to the need to provide IV hydration for suspected hypovolemia in the setting of: Pulmonary hypertension  -Need for the following intensive monitoring parameter(s) due to high risk of clinical decline: continuous oxygen monitoring, telemetry, and frequent monitoring of urine output to assess for the efficacy of diuretic regimen  -Need for intensive oxygen therapy of HFNC, which places the patient at high risk for oxygen toxicity  -The patient is at high risk of complications from pulmonary hypertension    Daily Checklist:  Diet: Regular Diet   DVT PPx: Lovenox 40mg  q24h  Electrolytes: Replete Potassium to >/= 3.6 and Magnesium to >/= 1.8  Code Status: DNR and DNI  Dispo:  Continue MPCU    Team Contact Information:   Primary Team: Infectious Disease ([facility])  Primary Resident: Sindy Guadeloupe, DO  Resident's Pager: [phone] (Infect Disease Intern - Tower)    Interval History:     No acute events overnight. Patient wearing BiPAP on morning exam and would like it to be taken off. Otherwise, no concerns or complaints.  Feels that her shortness of breath is stable at rest, however continued desaturations with exertion.  Had good diuresis with IV Lasix yesterday. Blood pressures are softer today so will hold off on further diuresis until discussed with nephrology.    ROS negative except for where otherwise noted above.    Objective:   Temp:  [35.9 ??C (96.6 ??F)-36.9 ??C (98.4 ??F)] 36.4 ??C (97.5 ??F)  Heart Rate:  [69-79] 70  SpO2 Pulse:  [70-85] 70  Resp:  [16-34] 21  BP: (76-114)/(50-75) 85/59  FiO2 (%):  [50 %] 50 %  SpO2:  [90 %-100 %] 93 %,   Intake/Output Summary (Last 24 hours) at 01/02/2023 0931  Last data filed at 01/02/2023 0345  Gross per 24 hour   Intake 0 ml   Output 1350 ml   Net -1350 ml   , FiO2 (%):  [50 %] 50 %  O2 Device: Large bore nasal cannula (cool high flow)  O2 Flow Rate (L/min):  [7 L/min-8  L/min] 7 L/min    Gen: NAD, converses well in full sentences  HENT: Atraumatic, normocephalic  Heart: RRR,   Lungs: Crackles at bilateral lung bases  Abdomen: Soft, nontender nondistended  Extremities: Stable lower extremity 1+ pitting edema

## 2023-01-02 NOTE — Unmapped (Signed)
Patient received all scheduled nebulizers throughout the evening. Patient was put on bipap after to rest and was able to tolerate the bipap all night. Patient was is positive and good spirits about the bipap to prepare for wearing one at home. Patient currently still on bipap.

## 2023-01-03 LAB — HEPATIC FUNCTION PANEL
ALBUMIN: 3.9 g/dL (ref 3.4–5.0)
ALKALINE PHOSPHATASE: 121 U/L — ABNORMAL HIGH (ref 46–116)
ALT (SGPT): 25 U/L (ref 10–49)
AST (SGOT): 22 U/L (ref <=34)
BILIRUBIN DIRECT: 0.2 mg/dL (ref 0.00–0.30)
BILIRUBIN TOTAL: 0.7 mg/dL (ref 0.3–1.2)
PROTEIN TOTAL: 7.4 g/dL (ref 5.7–8.2)

## 2023-01-03 LAB — MAGNESIUM
MAGNESIUM: 2.6 mg/dL (ref 1.6–2.6)
MAGNESIUM: 2.6 mg/dL (ref 1.6–2.6)
MAGNESIUM: 2.5 mg/dL (ref 1.6–2.6)

## 2023-01-03 LAB — BASIC METABOLIC PANEL
ANION GAP: 8 mmol/L (ref 5–14)
ANION GAP: 7 mmol/L (ref 5–14)
BLOOD UREA NITROGEN: 65 mg/dL — ABNORMAL HIGH (ref 9–23)
BLOOD UREA NITROGEN: 60 mg/dL — ABNORMAL HIGH (ref 9–23)
BUN / CREAT RATIO: 26
BUN / CREAT RATIO: 25
CALCIUM: 9.8 mg/dL (ref 8.7–10.4)
CALCIUM: 9.8 mg/dL (ref 8.7–10.4)
CHLORIDE: 96 mmol/L — ABNORMAL LOW (ref 98–107)
CHLORIDE: 95 mmol/L — ABNORMAL LOW (ref 98–107)
CO2: 36 mmol/L — ABNORMAL HIGH (ref 20.0–31.0)
CO2: 36 mmol/L — ABNORMAL HIGH (ref 20.0–31.0)
CREATININE: 2.46 mg/dL — ABNORMAL HIGH
CREATININE: 2.36 mg/dL — ABNORMAL HIGH
EGFR CKD-EPI (2021) FEMALE: 22 mL/min/{1.73_m2} — ABNORMAL LOW (ref >=60)
EGFR CKD-EPI (2021) FEMALE: 23 mL/min/{1.73_m2} — ABNORMAL LOW (ref >=60)
GLUCOSE RANDOM: 97 mg/dL (ref 70–179)
GLUCOSE RANDOM: 109 mg/dL (ref 70–179)
POTASSIUM: 4.8 mmol/L (ref 3.4–4.8)
POTASSIUM: 4.7 mmol/L (ref 3.4–4.8)
SODIUM: 140 mmol/L (ref 135–145)
SODIUM: 138 mmol/L (ref 135–145)

## 2023-01-03 LAB — ALBUMIN: ALBUMIN: 3.7 g/dL (ref 3.4–5.0)

## 2023-01-03 LAB — CBC
HEMATOCRIT: 33.4 % — ABNORMAL LOW (ref 34.0–44.0)
HEMOGLOBIN: 10.8 g/dL — ABNORMAL LOW (ref 11.3–14.9)
MEAN CORPUSCULAR HEMOGLOBIN CONC: 32.4 g/dL (ref 32.0–36.0)
MEAN CORPUSCULAR HEMOGLOBIN: 27.5 pg (ref 25.9–32.4)
MEAN CORPUSCULAR VOLUME: 85 fL (ref 77.6–95.7)
MEAN PLATELET VOLUME: 8.8 fL (ref 6.8–10.7)
PLATELET COUNT: 156 10*9/L (ref 150–450)
RED BLOOD CELL COUNT: 3.92 10*12/L — ABNORMAL LOW (ref 3.95–5.13)
RED CELL DISTRIBUTION WIDTH: 18.3 % — ABNORMAL HIGH (ref 12.2–15.2)
WBC ADJUSTED: 2.8 10*9/L — ABNORMAL LOW (ref 3.6–11.2)

## 2023-01-03 LAB — IRON PANEL
IRON SATURATION: 14 % — ABNORMAL LOW (ref 20–55)
IRON: 43 ug/dL — ABNORMAL LOW
TOTAL IRON BINDING CAPACITY: 305 ug/dL (ref 250–425)

## 2023-01-03 LAB — RETICULOCYTES
RETICULOCYTE ABSOLUTE COUNT: 130.7 10*9/L — ABNORMAL HIGH (ref 23.0–100.0)
RETICULOCYTE COUNT PCT: 3.34 % — ABNORMAL HIGH (ref 0.50–2.17)

## 2023-01-03 LAB — LACTATE, VENOUS, WHOLE BLOOD: LACTATE BLOOD VENOUS: 1.3 mmol/L (ref 0.5–1.8)

## 2023-01-03 LAB — FERRITIN: FERRITIN: 604.6 ng/mL — ABNORMAL HIGH

## 2023-01-03 MED ADMIN — oxyCODONE (ROXICODONE) immediate release tablet 2.5 mg: 2.5 mg | ORAL | @ 11:00:00 | Stop: 2023-01-03

## 2023-01-03 MED ADMIN — furosemide (LASIX) tablet 120 mg: 120 mg | ORAL | @ 22:00:00 | Stop: 2023-01-03

## 2023-01-03 MED ADMIN — arformoterol (BROVANA) nebulizer solution 15 mcg/2 mL: 15 ug | RESPIRATORY_TRACT | @ 12:00:00

## 2023-01-03 MED ADMIN — acetaminophen (TYLENOL) tablet 650 mg: 650 mg | ORAL | @ 13:00:00

## 2023-01-03 MED ADMIN — arformoterol (BROVANA) nebulizer solution 15 mcg/2 mL: 15 ug | RESPIRATORY_TRACT | @ 01:00:00

## 2023-01-03 MED ADMIN — ipratropium-albuterol (DUO-NEB) 0.5-2.5 mg/3 mL nebulizer solution 3 mL: 3 mL | RESPIRATORY_TRACT | @ 19:00:00

## 2023-01-03 MED ADMIN — gabapentin (NEURONTIN) capsule 100 mg: 100 mg | ORAL | @ 13:00:00

## 2023-01-03 MED ADMIN — posaconazole (NOXAFIL) delayed released tablet 200 mg: 200 mg | ORAL | @ 13:00:00

## 2023-01-03 MED ADMIN — insulin glargine (LANTUS) injection 12 Units: 12 [IU] | SUBCUTANEOUS | @ 01:00:00

## 2023-01-03 MED ADMIN — oxyCODONE (ROXICODONE) immediate release tablet 5 mg: 5 mg | ORAL | @ 02:00:00 | Stop: 2023-01-02

## 2023-01-03 MED ADMIN — iloprost (VENTAVIS) nebulizer solution: 5 ug | RESPIRATORY_TRACT | @ 22:00:00

## 2023-01-03 MED ADMIN — iloprost (VENTAVIS) nebulizer solution: 5 ug | RESPIRATORY_TRACT | @ 19:00:00

## 2023-01-03 MED ADMIN — insulin lispro (HumaLOG) injection 3 Units: 3 [IU] | SUBCUTANEOUS | @ 21:00:00

## 2023-01-03 MED ADMIN — acetaminophen (TYLENOL) tablet 650 mg: 650 mg | ORAL | @ 01:00:00

## 2023-01-03 MED ADMIN — sildenafiL (pulm.hypertension) (REVATIO) tablet 40 mg: 40 mg | ORAL | @ 01:00:00

## 2023-01-03 MED ADMIN — furosemide (LASIX) tablet 120 mg: 120 mg | ORAL | @ 16:00:00 | Stop: 2023-01-03

## 2023-01-03 MED ADMIN — enoxaparin (LOVENOX) syringe 40 mg: 40 mg | SUBCUTANEOUS | @ 01:00:00

## 2023-01-03 MED ADMIN — ipratropium-albuterol (DUO-NEB) 0.5-2.5 mg/3 mL nebulizer solution 3 mL: 3 mL | RESPIRATORY_TRACT | @ 13:00:00

## 2023-01-03 MED ADMIN — iloprost (VENTAVIS) nebulizer solution: 5 ug | RESPIRATORY_TRACT | @ 13:00:00

## 2023-01-03 MED ADMIN — insulin lispro (HumaLOG) injection 3 Units: 3 [IU] | SUBCUTANEOUS | @ 15:00:00

## 2023-01-03 MED ADMIN — gabapentin (NEURONTIN) capsule 100 mg: 100 mg | ORAL | @ 01:00:00

## 2023-01-03 MED ADMIN — azithromycin (ZITHROMAX) tablet 250 mg: 250 mg | ORAL | @ 13:00:00 | Stop: 2025-08-25

## 2023-01-03 MED ADMIN — ipratropium-albuterol (DUO-NEB) 0.5-2.5 mg/3 mL nebulizer solution 3 mL: 3 mL | RESPIRATORY_TRACT | @ 01:00:00

## 2023-01-03 MED ADMIN — iloprost (VENTAVIS) nebulizer solution: 5 ug | RESPIRATORY_TRACT | @ 01:00:00

## 2023-01-03 MED ADMIN — acetaminophen (TYLENOL) tablet 650 mg: 650 mg | ORAL | @ 21:00:00

## 2023-01-03 MED ADMIN — diclofenac sodium (VOLTAREN) 1 % gel 2 g: 2 g | TOPICAL | @ 01:00:00

## 2023-01-03 MED ADMIN — budesonide (PULMICORT) nebulizer solution 0.5 mg: .5 mg | RESPIRATORY_TRACT

## 2023-01-03 MED ADMIN — iloprost (VENTAVIS) nebulizer solution: 5 ug | RESPIRATORY_TRACT | @ 04:00:00

## 2023-01-03 MED ADMIN — sildenafiL (pulm.hypertension) (REVATIO) tablet 40 mg: 40 mg | ORAL | @ 13:00:00

## 2023-01-03 MED ADMIN — iloprost (VENTAVIS) nebulizer solution: 5 ug | RESPIRATORY_TRACT | @ 16:00:00

## 2023-01-03 MED ADMIN — arformoterol (BROVANA) nebulizer solution 15 mcg/2 mL: 15 ug | RESPIRATORY_TRACT

## 2023-01-03 MED ADMIN — budesonide (PULMICORT) nebulizer solution 0.5 mg: .5 mg | RESPIRATORY_TRACT | @ 12:00:00

## 2023-01-03 MED ADMIN — gabapentin (NEURONTIN) capsule 100 mg: 100 mg | ORAL | @ 17:00:00

## 2023-01-03 MED ADMIN — budesonide (PULMICORT) nebulizer solution 0.5 mg: .5 mg | RESPIRATORY_TRACT | @ 01:00:00

## 2023-01-03 MED ADMIN — ipratropium-albuterol (DUO-NEB) 0.5-2.5 mg/3 mL nebulizer solution 3 mL: 3 mL | RESPIRATORY_TRACT | @ 08:00:00

## 2023-01-03 MED ADMIN — sildenafiL (pulm.hypertension) (REVATIO) tablet 40 mg: 40 mg | ORAL | @ 17:00:00

## 2023-01-03 NOTE — Unmapped (Signed)
Assessment: Karen Barber is a 57 y.o. female who was admitted for Acute on chronic hypoxic respiratory failure (CMS-HCC)       Shift Events: Karen Barber remains on MPCU. A&OX4. On 7L LBNC/Bipap 12/8 @ 50%; lung sounds diminished with faint crackles; DOE. HR- 70s-80s. SBP- 80s-90s. Afebrile. Voiding per purewick. No BM this shift.         Current Hospital Length of Stay: 35 Days    INFUSIONS:      VITALS:  Vitals:    01/02/23 0830 01/03/23 0532   Weight: 87.5 kg (192 lb 14.4 oz) 86.8 kg (191 lb 6.4 oz)      Weight change:      Temp:  [35.9 ??C (96.7 ??F)-36.8 ??C (98.2 ??F)] 35.9 ??C (96.7 ??F)  Heart Rate:  [69-96] 78  SpO2 Pulse:  [70-84] 77  Resp:  [18-30] 18  BP: (76-103)/(55-76) 88/57  MAP (mmHg):  [61-84] 68  FiO2 (%):  [50 %] 50 %  SpO2:  [92 %-99 %] 94 %     VENT/OXYGEN SETTINGS:  FiO2 (%): 50 %    INS & OUTS:  Date 01/02/23 0701 - 01/03/23 0700 01/03/23 0701 - 01/04/23 0700   Shift 0701-1900 1901-0700 24 Hour Total 0701-1900 1901-0700 24 Hour Total   INTAKE   P.O. 980 50 1030      Shift Total 980 50 1030      OUTPUT   Urine  1300 1300      Shift Total  1300 1300        Intake/Output Summary (Last 24 hours) at 01/03/2023 0536  Last data filed at 01/03/2023 0530  Gross per 24 hour   Intake 1030 ml   Output 1300 ml   Net -270 ml        ACCESS:  Patient Lines/Drains/Airways Status       Active Active Lines, Drains, & Airways       Name Placement date Placement time Site Days    External Urinary Device 12/02/22 With Suction 12/02/22  1900  -- 31    Peripheral IV 01/01/23 Posterior;Right Forearm 01/01/23  0543  Forearm  1                           See flowsheets/MAR for further information      Karen Gauss, RN  January 03, 2023 5:36 AM                                                    Problem: Adult Inpatient Plan of Care  Goal: Plan of Care Review  Outcome: Ongoing - Unchanged     Problem: Adult Inpatient Plan of Care  Goal: Plan of Care Review  Outcome: Ongoing - Unchanged  Goal: Patient-Specific Goal (Individualized)  Outcome: Ongoing - Unchanged  Goal: Absence of Hospital-Acquired Illness or Injury  Outcome: Ongoing - Unchanged  Intervention: Prevent Skin Injury  Recent Flowsheet Documentation  Taken 01/03/2023 0400 by Karen Gauss, RN  Positioning for Skin: Supine/Back  Taken 01/03/2023 0200 by Karen Gauss, RN  Positioning for Skin: Supine/Back  Device Skin Pressure Protection:   absorbent pad utilized/changed   adhesive use limited   positioning supports utilized   pressure points protected   skin-to-device areas padded   tubing/devices free from skin contact  skin-to-skin areas padded  Skin Protection:   adhesive use limited   incontinence pads utilized   skin-to-device areas padded   skin-to-skin areas padded   tubing/devices free from skin contact  Goal: Optimal Comfort and Wellbeing  Outcome: Ongoing - Unchanged  Goal: Readiness for Transition of Care  Outcome: Ongoing - Unchanged  Goal: Rounds/Family Conference  Outcome: Ongoing - Unchanged     Problem: Gas Exchange Impaired  Goal: Optimal Gas Exchange  Outcome: Ongoing - Unchanged  Intervention: Optimize Oxygenation and Ventilation  Recent Flowsheet Documentation  Taken 01/03/2023 0400 by Karen Gauss, RN  Head of Bed [facility]) Positioning: HOB elevated  Taken 01/03/2023 0200 by Karen Gauss, RN  Head of Bed [facility]) Positioning: HOB elevated     Problem: Comorbidity Management  Goal: Blood Glucose Levels Within Targeted Range  Outcome: Ongoing - Unchanged     Problem: Comorbidity Management  Goal: Blood Glucose Levels Within Targeted Range  Outcome: Ongoing - Unchanged     Problem: Comorbidity Management  Goal: Blood Glucose Levels Within Targeted Range  Outcome: Ongoing - Unchanged  Goal: Blood Pressure in Desired Range  Outcome: Ongoing - Unchanged     Problem: Skin Injury Risk Increased  Goal: Skin Health and Integrity  Outcome: Ongoing - Unchanged  Intervention: Optimize Skin Protection  Recent Flowsheet Documentation  Taken 01/03/2023 0400 by Karen Gauss, RN  Head of Bed [facility]) Positioning: HOB elevated  Taken 01/03/2023 0200 by Karen Gauss, RN  Pressure Reduction Techniques:   heels elevated off bed   frequent weight shift encouraged  Head of Bed (HOB) Positioning: HOB elevated  Pressure Reduction Devices: positioning supports utilized  Skin Protection:   adhesive use limited   incontinence pads utilized   skin-to-device areas padded   skin-to-skin areas padded   tubing/devices free from skin contact

## 2023-01-03 NOTE — Unmapped (Signed)
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  On 10/7 began rising after diuresis of 2.7 L of urine on 10/6, and creatinine up to 2.95 at the time of consult.  DDx of prerenal from relative volume depletion, cardiorenal given narrow threshold for forward flow, congestion though suspect this is not the case in the setting of good urine output and rising CO2 despite BNP's rising over the last few days.  Volume status exam is extremely challenging in the setting of her pulmonary hypertension but fortunately has stabilized and improved after gentle diuresis yesterday favoring cardiorenal congestive AKI rather than the opposite with improvement in her BNP. This reinforces a very narrow window of euvolemia  - Sediment 10/9 bland  - Pt oxygenation slightly above baseline at 7 L from home 6L.   - weight relatively unchanged since coming to ED 85-87 kg, down by 0.7 kgs today  - Plan to switch IV diuretics to oral diuretics furosemide 120 mg oral BID and reassess output.   - will need outpt labs on Friday (10/18) for K and creatinine  - will refer to [facility] with nephrology, pt is agreeable     # Pulmonary sarcoidosis complicated by pulmonary hypertension on chronic oxygen  - Evaluation and management per primary team  - No changes to management from a nephrology standpoint at this time    RECOMMENDATIONS:   - plan for diurese today furosemide 120 mg oral BID  -outpt nephrology follow up with [facility]  Repeat labs 10/18 to assess potassium and creatinine  Strict I and O  Recc daily weight  - We will continue to follow.     [name], MD  01/03/2023 1:06 PM     Medical decision-making for 01/03/23  Findings / Data     Patient has: []  acute illness w/systemic sxs  [mod]  []  two or more stable chronic illnesses [mod]  []  one chronic illness with acute exacerbation [mod]  []  acute complicated illness  [mod]  []  Undiagnosed new problem with uncertain prognosis  [mod] [x]  illness posing risk to life or bodily function (ex. AKI)  [high]  []  chronic illness with severe exacerbation/progression  [high]  []  chronic illness with severe side effects of treatment  [high] AKI Probs At least 2:  Probs, Data, Risk   I reviewed: [x]  primary team note  [x]  consultant note(s)  []  external records [x]  chemistry results  []  CBC results  []  blood gas results  []  Other []  procedure/op note(s)   []  radiology report(s)  []  micro result(s)  []  w/ independent historian(s) Pulm notes reviewed, Cr 2.6 stable; diuresing >=3 Data Review (2 of 3)    I independently interpreted: []  Urine Sediment  []  Renal US []  CXR Images  []  CT Images  []  Other []  EKG Tracing  Any     I discussed: []  Pathology results w/ QHPs(s) from other specialties  []  Procedural findings w/ QHPs(s) from other specialties []  Imaging w/ QHP(s) from other specialties  [  x] Treatment plan w/ QHP(s) from other specialties Plan discussed with primary team Any     Mgm't requires: []  Prescription drug(s)  [mod]  []  Kidney biopsy  [mod]  []  Central line placement  [mod] []  High risk medication use and/or intensive toxicity monitoring [high]  []  Renal replacement therapy [high]  []  High risk kidney biopsy  [high]  []  Escalation of care  [high]  []  High risk central line placement  [high] IV Diuresis: check BMP/Mg  Risk      ____________________________________________________  Interval Hx  Pt feels well. No complaints. Denies CP, SOB, N, v, diarrhea, abd pain      History of Present Illness: Karen Barber is 57 y.o. 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 Nephrology has been consulted for AKI.     Patient was initially admitted 9/11 when she presented for a PET scan to evaluate her sarcoidosis but unfortunately was found to be hypoxemic on her home 6 L of nasal cannula requiring high flow nasal cannula and was admitted initially to the ICU.  She was diuresed and her oxygenation improved and she was stepped down to the MPCU.  She was treated with antibiotics as well as steroids for infection and bronchiectasis.  She underwent right heart cath on 9/20 with elevated mean PA pressures and was started on iloprost.  Notes that she has not been told she has kidney issues prior to this as she regularly follows up with a PCP.  She notes that she is not eating or drinking very well as she is worried about accumulating fluid whenever she eats salt and she does not like the food here.  Denies nausea.    Lasix dosing over the last few days has included 80 mg IV once or twice twice a day till 10/6, with diuresis holiday 10/7 and then given 60 mg IV Lasix on 10/8.    Patient does note that she is urinating less over the last few days and has to push a little bit to empty her bladder at the end.    Of note O2 requirement is gradually increasing from baseline of 6 L nasal cannula on 10/6 and 10/7 up to 9 and up to 10 L on day of consult.  Urine output of 300 up to 700 today.  Curiously proBNP has been rising the last few days though team has felt she is relatively euvolemic during this time.    Creatinine recently has steady down in the 1.6-1.7 range however starting 10/7 rising up and up to 2.95 on day of consult    INPATIENT MEDICATIONS:    Current Facility-Administered Medications:     acetaminophen (TYLENOL) tablet 650 mg, Oral, Q6H    albuterol 2.5 mg /3 mL (0.083 %) nebulizer solution 2.5 mg, Nebulization, Q6H PRN    aluminum-magnesium hydroxide-simethicone (MAALOX MAX) 80-80-8 mg/mL oral suspension, Oral, Q6H PRN    arformoterol (BROVANA) nebulizer solution 15 mcg/2 mL, Nebulization, BID (RT)    azithromycin (ZITHROMAX) tablet 250 mg, Oral, Daily    budesonide (PULMICORT) nebulizer solution 0.5 mg, Nebulization, BID (RT)    calcium carbonate (TUMS) chewable tablet 200 mg elem calcium, Oral, TID PRN    dextrose (D10W) 10% bolus 125 mL,  Intravenous, Q10 Min PRN    diclofenac sodium (VOLTAREN) 1 % gel 2 g, Topical, QID    enoxaparin (LOVENOX) syringe 40 mg, Subcutaneous, Nightly    flu vaccine TS 2024-25(53mos up)(PF)(FLULAVAL, FLUARIX, FLUZONE), Intramuscular, During hospitalization    [START ON 01/04/2023] furosemide (LASIX) tablet 120 mg, Oral, BID    furosemide (LASIX) tablet 120 mg, Oral, Once    gabapentin (NEURONTIN) capsule 100 mg, Oral, TID    glucagon injection 1 mg, Intramuscular, Once PRN    iloprost (VENTAVIS) nebulizer solution, Nebulization, 6XD    insulin glargine (LANTUS) injection 12 Units, Subcutaneous, Nightly    insulin lispro (HumaLOG) injection 0-20 Units, Subcutaneous, ACHS    insulin lispro (HumaLOG) injection 3 Units, Subcutaneous, 3xd Meals    ipratropium-albuterol (DUO-NEB) 0.5-2.5 mg/3 mL nebulizer solution 3 mL, Nebulization, Q6H (RT)    lidocaine (ASPERCREME) 4 % 1 patch, Transdermal, Daily    [Provider Hold] magnesium oxide-Mg AA chelate (Magnesium Plus Protein) 2 tablet, Oral, BID    melatonin tablet 6 mg, Oral, Nightly PRN    ondansetron (ZOFRAN-ODT) disintegrating tablet 4 mg, Oral, Q8H PRN **OR** ondansetron (ZOFRAN) injection 4 mg, Intravenous, Q8H PRN    polyethylene glycol (MIRALAX) packet 17 g, Oral, Daily PRN    posaconazole (NOXAFIL) delayed released tablet 200 mg, Oral, Daily    prochlorperazine (COMPAZINE) tablet 5 mg, Oral, Q6H PRN    senna (SENOKOT) tablet 2 tablet, Oral, Nightly PRN    sildenafiL (pulm.hypertension) (REVATIO) tablet 40 mg, Oral, TID    sodium chloride (OCEAN) 0.65 % nasal spray 1 spray, Each Nare, Q6H PRN    OUTPATIENT MEDICATIONS:  Prior to Admission medications    Medication Dose, Route, Frequency   ACCU-CHEK GUIDE GLUCOSE METER Misc 1 Device, Other, 4 times a day (ACHS), AS DIRECTED   albuterol 2.5 mg /3 mL (0.083 %) nebulizer solution 2.5 mg, Nebulization, Every 6 hours PRN   albuterol HFA 90 mcg/actuation inhaler 2 puffs, Inhalation, Every 4 hours PRN   arformoterol (BROVANA) 15 mcg/2 mL 15 mcg, Nebulization, 2 times daily (RT)   aspirin-caffeine (BAYER BACK AND BODY) 500-32.5 mg Tab Oral, Takes 2 1/2 pills 3-4 times a day as needed.   azithromycin (ZITHROMAX) 250 MG tablet 250 mg, Oral, Every 24 hours, Start on 9/1 after completing the levofloxacin   blood sugar diagnostic Strp Other, 3 times a day (standard), ACCU-CHEK Guide meter   budesonide (PULMICORT) 0.5 mg/2 mL nebulizer solution 0.5 mg, Nebulization, 2 times daily (RT)   furosemide (LASIX) 80 MG tablet 80 mg, Oral, 2 times a day (standard), Take a third dose as needed for increasing weight or increasing swelling.   gabapentin (NEURONTIN) 300 MG capsule 300 mg, Oral, 3 times a day (standard)   insulin aspart (NOVOLOG U-100 INSULIN ASPART) 100 unit/mL vial 1-20 Units, Subcutaneous, 4 times a day (ACHS), SLIDING SCALE   insulin glargine (BASAGLAR KWIKPEN U-100 INSULIN) 100 unit/mL (3 mL) injection pen 18 Units, Subcutaneous, Nightly   ipratropium (ATROVENT) 0.02 % nebulizer solution 500 mcg, Nebulization, 4 times a day   posaconazole (NOXAFIL) 100 mg delayed released tablet 200 mg, Oral, Daily (standard)   sildenafiL, pulm.hypertension, (REVATIO) 20 mg tablet 20 mg, Oral, 3 times a day (standard)   OXYGEN-AIR DELIVERY SYSTEMS MISC Dose: 2-3 LPM        ALLERGIES:  Patient has no known allergies.    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 A.niger from sputum 08/2016-09/2016.    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    Sarcoidosis 1995    Type 2 diabetes mellitus with hyperglycemia (CMS-HCC) 07/27/2014    Visual impairment     glasses     Past Surgical History:   Procedure Laterality Date    LUNG LOBECTOMY      PR AMPUTATION TOE,MT-P JT Right 12/12/2019    Procedure: Right foot fifth toe amputation at the metatarsophalangeal joint level;  Surgeon: Britt Bottom, DPM;  Location: MAIN OR [facility];  Service: Vascular    PR AMPUTATION TOE,MT-P JT Right 06/16/2020    Procedure: Right foot amputation of 3rd toe at mpj level;  Surgeon: Britt Bottom, DPM;  Location: MAIN OR [facility];  Service: Vascular    PR RIGHT HEART CATH O2 SATURATION & CARDIAC OUTPUT N/A 04/29/2019    Procedure: Right Heart Catheterization;  Surgeon: Rosana Hoes, MD;  Location: [facility] CATH;  Service: Cardiology    PR RIGHT HEART CATH O2 SATURATION & CARDIAC OUTPUT N/A 12/08/2022    Procedure: Right Heart Catheterization;  Surgeon: Neal Dy, MD;  Location: [facility] CATH;  Service: Cardiology    PR THORACOSCOPY SURG TOT PULM DECORT Left 12/14/2014    Procedure: THORACOSCOPY SURG; W/TOT PULM DECORTIC/PNEUMOLYS;  Surgeon: Evert Kohl, MD; Location: MAIN OR [facility];  Service: Cardiothoracic    PR THORACOSCOPY W/THERA WEDGE RESEXN INITIAL UNILAT Left 12/03/2014    Procedure: THORACOSCOPY, SURGICAL; WITH THERAPEUTIC WEDGE RESECTION (EG, MASS, NODULE) INITIAL UNILATERAL;  Surgeon: Evert Kohl, MD;  Location: MAIN OR [facility];  Service: Cardiothoracic     SOCIAL HISTORY  Social History     Social History Narrative    Not on file      reports that she quit smoking about 35 years ago. Her smoking use included cigarettes. She started smoking about 44 years ago. She has a 4.5 pack-year smoking history. She has never used smokeless tobacco. She reports that she does not drink alcohol and does not use drugs.   FAMILY HISTORY  Family History   Problem Relation Age of Onset    Dementia Mother     Diabetes Father     Diabetes Brother     Diabetes Maternal Aunt     Sarcoidosis Maternal Aunt     Diabetes Maternal Uncle     Diabetes Paternal Aunt     Diabetes Paternal Uncle         Physical Exam:   Vitals:    01/03/23 0600 01/03/23 0713 01/03/23 0715 01/03/23 1200   BP:  102/70 102/70 91/56   Pulse: 70  70 75   Resp: 26 18 18 24    Temp:  36.1 ??C (97 ??F)  36.3 ??C (97.4 ??F)   TempSrc:  Axillary  Oral   SpO2: 99% 99% 99% 95%  Weight:       Height:         I/O this shift:  In: 150 [P.O.:150]  Out: 0     Intake/Output Summary (Last 24 hours) at 01/03/2023 1306  Last data filed at 01/03/2023 1200  Gross per 24 hour   Intake 600 ml   Output 1300 ml   Net -700 ml     Constitutional: well-appearing, no acute distress sitting up in bed  Heart: RRR, no m/r/g  Lungs: little to no crackle sin lung bases  Abd: non-distended  Ext: No significant edema

## 2023-01-03 NOTE — Unmapped (Signed)
Infectious Disease ([facility]) Progress Note    Assessment & Plan:   Karen Barber is a 57 y.o. female whose presentation is complicated by bronchiectasis type 1 in the setting of pulmonary sarcoidosis (6L [place] home O2), HTN, T2DM, COPD, s/p LUL lobectomy, and asthma that presented to [facility] admitted to MICU after becoming hypoxic after holding home meds prior to PET scan. After 1 week diuresing in ICU, patient stabilized and transferred to the MPCU.    Principal Problem:    Acute on chronic hypoxic respiratory failure (CMS-HCC)  Active Problems:    Pulmonary sarcoidosis (CMS-HCC)    Type 2 diabetes mellitus with hyperglycemia (CMS-HCC)    COPD (chronic obstructive pulmonary disease) (CMS-HCC)    Chronic pulmonary aspergillosis (CMS-HCC)    Pulmonary hypertension (CMS-HCC)    AKI (acute kidney injury) (CMS-HCC)    Active Problems  #Acute on Chronic Hypoxemic Respiratory Failure -   #Pulmonary Sarcoidosis -   #COPD/asthma -   #Bronchiectasis  Baseline oxygen 6L.  Recent admission with pneumonia growing Serratia marcescens. Patient held her morning medications prior to outpatient PET CT scan during which she became hypoxic to the 70s and admitted to MICU.  CXR consistent with extensive pulmonary fibrosis. She was weaned from high flow nasal cannula to large bore nasal cannula after IV diuresis and antibiotics. PET scan 9/18 is not indicative of active pulmonary sarcoidosis. RHC 9/20 showing significant pulm HTN disease with mean PA pressure of 47, for which pulmonology team started Iloprost. Notably, wedge pressure on that study was 5, which is reassuring against volume as driver of her respiratory status at that time. Patient has been having variations in oxygen requirement.  Continuing IV diuresis with goal of net -500 mL.   -- Continue to wean O2 with goal saturations of 90-94% per pulmonology  -- Consulted palliative for goals of care conversation as well as symptom management.  -- Plan to get oxymizer for her while admitted. Will need oxymizer at home  -- Continue home airway clearance (DuoNebs and HTS 3 times daily with [place])  -- Continue home Pulmicort, Brovana, Atrovent, as needed albuterol, and azithromycin  -- PT/OT: 5xL  -- Standing weights daily  -- Nephrology following and recommend transition to 120 mg PO Lasix BID today     #Pulmonary hypertension - RV failure - Orthostatic hypotension  History of pulmonary hypertension secondary to pulmonary sarcoidosis/COPD.  TTE this admission with severely dilated right atrium with severe pulmonary hypertension PASP 86 mmHg.  Estimated right atrial pressure 5 to 10 mmHg.  On Lasix 80 mg twice daily at home.  Echo showed LVEF > 55% and severe pulmonary hypertension without significant change from prior.  Right heart cath showed severe pulmonary hypertension, markedly elevated PVR (9.3 - 10.5) and low cardiac output / index. HRCT scan shows unchanged fibrotic pulmonary sarcoidosis with bilateral segmental and subsegmental fibrosis post left upper lobectomy with unchanged apical right upper lobe and superior segment left lower lobe bullae.   -- Continue inhaled iloprost 5 mcg 6 times a day q4h; please do not mix with other nebulizer solutions   -- Plan to transition to Tyvaso nebs, working with pharmacy and [facility] nurse coordinator  -- [facility] rep to do teaching prior to DC  -- Discussion with pulmonology regarding future goals for patient    #Acute kidney injury on CKD3 (downtrending)  Creatinine 1.3 on admission which seems to be baseline. Hospital course with rise of creatinine up to 3.   -- Creatinine 2.46 today  --  Renally dose medications  -- Daily BMP    #Secondary Hyperparathyroidism, Hypocalcemia, and Hyperphosphatemia  Secondary hyperparathyroidism due to CKD.  -- Reconsider need for future oral calcium carbonate 1600 mg TID  -- Reconsider need for future phoslo 667 mg PO TID   -- Reassess PTH levels in 4-6 weeks. Last checked 9/22.    #Type 2 diabetes mellitus with hyperglycemia and hypoglycemia  Hemoglobin A1c 8.3%.  Home regimen includes glargine 18 units nightly and sliding scale insulin.  Large fluctuations in glucose greater than 300 and occasional values ~50.  -- Sliding scale insulin insulin sensitivity factor 40  --12 glargine nightly  -- 3 units lispro tid      Chronic Problems     #Chronic Pain   - Gabapentin resumed at reduced dose     #Chronic pulmonary aspergillosis  On chronic posaconazole. Soluble IL-2 receptor, Aspergillus Galactomannan Antigen, IgE checked 10/2022 all wnl  -Continue home posaconazole Ppx  -Outpatient evaluation for [facility] scheduled for 10/25 to evaluate recurrent hemoptysis     #Iron deficiency anemia  #Chronic normocytic anemia (baseline hemoglobin 11)  --Iron supplementation with iron dextran 1,000mg  in 250cc    #Hyperlipidemia  LDL of 146  -- Follow-up outpatient    Issues Impacting Complexity of Management:  -The patient is at high risk for the development of complications of volume overload due to the need to provide IV hydration for suspected hypovolemia in the setting of: Pulmonary hypertension  -Need for the following intensive monitoring parameter(s) due to high risk of clinical decline: continuous oxygen monitoring, telemetry, and frequent monitoring of urine output to assess for the efficacy of diuretic regimen  -Need for intensive oxygen therapy of HFNC, which places the patient at high risk for oxygen toxicity  -The patient is at high risk of complications from pulmonary hypertension    Daily Checklist:  Diet: Regular Diet   DVT PPx: Lovenox 40mg  q24h  Electrolytes: Replete Potassium to >/= 3.6 and Magnesium to >/= 1.8  Code Status: DNR and DNI  Dispo:  Continue MPCU    Team Contact Information:   Primary Team: Infectious Disease ([facility])  Primary Resident: Sindy Guadeloupe, DO  Resident's Pager: [phone] (Infect Disease Intern - Tower)    Interval History:     No acute events overnight.     Patient wearing BiPAP during exam. She endorses lower back pain and frustration due to weakness/deconditioning. Feels that her shortness of breath is limiting her ability to work with PT and OT. Total output 1.3L yesterday with net negative 270 yesterday with IV lasix and metolazone. Blood pressures low this morning but she denies headache, lightheadedness, dizziness. JVP slightly elevated compared to yesterday. Plan to transition to oral diuresis today    ROS negative except for where otherwise noted above.    Objective:   Temp:  [35.9 ??C (96.7 ??F)-36.8 ??C (98.2 ??F)] 36.1 ??C (97 ??F)  Heart Rate:  [70-96] 70  SpO2 Pulse:  [70-84] 70  Resp:  [18-30] 18  BP: (87-103)/(57-76) 102/70  FiO2 (%):  [45 %-50 %] 45 %  SpO2:  [92 %-99 %] 99 %,   Intake/Output Summary (Last 24 hours) at 01/03/2023 0938  Last data filed at 01/03/2023 0800  Gross per 24 hour   Intake 690 ml   Output 1300 ml   Net -610 ml   , FiO2 (%):  [45 %-50 %] 45 %  O2 Device: Large bore nasal cannula (cool high flow)  O2 Flow Rate (L/min):  [7 L/min]  7 L/min    Gen: NAD, converses well in full sentences  HENT: Atraumatic, normocephalic  Heart: RRR,   Lungs: Crackles at left lung base. No crackles or rhonchi heard in right lung.  Abdomen: Soft, nontender nondistended  Extremities: No lower extremity edema

## 2023-01-03 NOTE — Unmapped (Signed)
Pulmonary Consult Service  Follow-Up Note      Primary Service:   Medicine - MedK  Primary Service Attending:  Hulen Skains, MD  Reason for Consult:   Assistance with Diuresis, Pulmonary HTN    ASSESSMENT and PLAN     Karen Barber is a 57 y.o. 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 Pt was found to have acute on chronic respiratory failure (sat in 70s on home 6L) in the setting of not taking her home medications (sildenafil, lasix, nebs, inhalers) in preparation for PET scan, admitted 9/11. She was admitted to the ICU initially, and weaned back to 6-8L [place]  with airway clearance and diuresis and transitioned to [facility]PCU. Her acute on chronic hypoxemia and increase O2 requirement was likely multifactorial -- combination of volume overload as well as lack of airway clearance at home, however I think chronically her O2 requirement (related to her PH) is higher than the amount she has been on at home and she continues to have severe exertional hypoxemia probably mostly related to her parenchymal disease.      We have been struggling to strike a balance with diuresis for her with overall poor renal function (eGFR 15) and pHTN. She seems fairly stably increased in her O2 needs to ~7-8L [facility]. There is not much more to offer her in terms of additional PAH therapies.     RV Failure - PAH (Group 3, 5): RHC 9/20 this admission demonstrated severe pulmonary hypertension, markedly elevated PVR (9.3-10.5) and low CO/CI. Ultimately started on Tyvaso shortly thereafter for her PH-ILD diagnosis. She remains in the hospital for worsening AKI and challenges managing her volume status; creatinine initially improving with diuresis (now seems to have plateaued) and repeat echo last week shows relatively similar findings. Unfortunately she has limited options with high baseline O2 requirement and severe lung disease and is not a transplant candidate.   - please continue home AVAPS based on her chronic hypercarbic RF. We have corrected the order and spoke with RT regarding this on 10/16.   - Defer diuresis to nephrology  - Tyvaso teaching will take place when there is a confirmed discharge date. Patient does not currently have delivery device.  - Continue inhaled iloprost 5 mcg q4h -> Tyvaso at discharge.   - Continue sildenafil 40 mg TID  - Continue daily weight with strict I/Os  - cont monitoring pro-BNP, can be every other day  - please obtain daily standing weights  - Would obtain iron studies, LFTs, and coags   - Would discuss if would ever be a dialysis candidate for volume management with nephrology to help with GOC discussions    Pulmonary Sarcoidosis c/b Bronchiectasis  Soluble IL-2 receptor, Aspergillus Galactomannan Antigen, IgE checked 10/2022 all wnl. PET scan 9/17 does not appear to be active pulmonary sarcoidosis and again it is overall felt to be burned out from sarcoidosis standpoint. She is now stable satting at >95% on 8-10L via [facility]. It is likely she has higher O2 requirement chronically than the amount she has been using at home.   - O2 sat goal at 90-94%, wean oxygen as tolerated  - continue home pulmicort, brovana, PRN albuterol, azithromycin  - please cont airway clearance:              -  duonebs TID and HTS (3%) BID with [place]. Please resume HTS (had fallen off MAR)     Chronic Pulmonary Aspergillosis   Aspergillus Galactomannan Antigen, IgE checked 10/2022 all wnl.   - continue home posaconazole     [name] was seen, examined and discussed with  Dr. Judie Petit. [name]   Thank you for involving [place] in her care. We look forward to following with you.  Please don't hesitate to page [place] with any questions or concerns at [phone] (pulmonary consult fellow).    Birder Robson, MD  Pulmonary Critical Care Fellow     SUBJECTIVE:     - Had back pain overnight, received oxycodone  - On BIPAP overnight  - feeling discouraged today about being in the hospital for so long. Would really love a good shower.   - Hasn't been getting HTS nebs. Feels like they were very helpful.   - Would like to try AVAPs.    - Desaturating throughout conversation     Medications:     Scheduled Meds:   acetaminophen  650 mg Oral Q6H    arformoterol  15 mcg Nebulization BID (RT)    azithromycin  250 mg Oral Daily    budesonide  0.5 mg Nebulization BID (RT)    diclofenac sodium  2 g Topical QID    enoxaparin (LOVENOX) injection  40 mg Subcutaneous Nightly    flu vacc [id number] 6mos up(PF)  0.5 mL Intramuscular During hospitalization    [Provider Hold] furosemide  80 mg Oral BID    gabapentin  100 mg Oral TID    iloprost  5 mcg Nebulization 6XD    insulin glargine  12 Units Subcutaneous Nightly    insulin lispro  0-20 Units Subcutaneous ACHS    insulin lispro  3 Units Subcutaneous 3xd Meals    ipratropium-albuterol  3 mL Nebulization Q6H (RT)    lidocaine  1 patch Transdermal Daily    [Provider Hold] magnesium oxide-Mg AA chelate  2 tablet Oral BID    posaconazole  200 mg Oral Daily    sildenafiL (pulm.hypertension)  40 mg Oral TID     Continuous Infusions:  PRN Meds:.albuterol, aluminum-magnesium hydroxide-simethicone, calcium carbonate, dextrose in water, glucagon, melatonin, ondansetron **OR** ondansetron, polyethylene glycol, prochlorperazine, senna, sodium chloride     PHYSICAL EXAM:   BP 88/57 Comment: Simultaneous filing. User may not have seen previous data. - Pulse 70  - Temp 36.1 ??C (97 ??F) (Axillary)  - Resp 26  - Ht 180.3 cm (5' 11)  - Wt 86.8 kg (191 lb 6.4 oz)  - LMP  (LMP Unknown)  - SpO2 99%  - BMI 26.69 kg/m??   General: NAD, alert sitting up in bed   HEENT: MMM, wearing [place]  Cardiovascular: RRR  Pulmonary: comfortable respirations on LBNC, coarse crackles bilaterally mostly in the bases R>L, diminished in the apices  Musculoskeletal: trace LE edema similar to prior  Skin: no lesions noted on clothed exam  Neuro: No focal neurological deficits.    LABORATORY and RADIOLOGY DATA:     Pertinent Laboratory Data from the last 24 hours:  Lab Results   Component Value Date    WBC 2.8 (L) 01/02/2023    HGB 11.2 (L) 01/02/2023    HCT 34.9 01/02/2023    PLT 147 (L) 01/02/2023     Lab Results   Component Value Date    NA 138 01/02/2023    K 5.3 (H) 01/02/2023    CL 96 (  L) 01/02/2023    CO2 37.0 (H) 01/02/2023    BUN 65 (H) 01/02/2023    CREATININE 2.53 (H) 01/02/2023    GLU 108 01/02/2023    CALCIUM 9.9 01/02/2023    MG 2.8 (H) 01/02/2023    PHOS 5.3 (H) 12/25/2022       Lab Results   Component Value Date    BILITOT 0.7 12/25/2022    BILIDIR <0.10 05/30/2019    PROT 7.3 12/25/2022    ALBUMIN 3.6 12/25/2022    ALT 20 12/25/2022    AST 27 12/25/2022    ALKPHOS 91 12/25/2022    GGT 60 (H) 05/31/2011       Lab Results   Component Value Date    INR 1.02 12/11/2019    APTT 30.9 12/11/2019       Pertinent Micro Data:  Microbiology Results (last day)       ** No results found for the last 24 hours. **          Pertinent Imaging Data from this admission:  10/10 Limited Echo  Summary    1. The left ventricle is relatively small in size with normal wall  thickness.    2. The left ventricular systolic function is normal, LVEF is visually  estimated at 55-60%.    3. The aortic valve is trileaflet with moderately thickened leaflets with  normal excursion.    4. The right ventricle is severely dilated in size, with moderately reduced  systolic function.    5. There is moderate tricuspid regurgitation.    6. There is severe pulmonary hypertension.    7. The right atrium is severely dilated in size.    8. IVC size and inspiratory change suggest elevated right atrial pressure.  (10-20 mmHg).

## 2023-01-03 NOTE — Unmapped (Signed)
0700-1900:    Patient is A&O x 4. On 7L LBNC and AVAPS during night time. Pt ventilated with the chaplain about feeling loneliness and sad. Wanting to get back home where pt thinks she belongs. This RN provided the emotional support and listened the pt's concerned.Pt complaints of lower back pain. Schedule tylenol relief the pain. Diminished urine output.1 BM this shift. Glycemic management maintained as per the order. Free from falls and injury. Call bell within reach. See flowsheets/MAR for further info.       Current Hospital Length of Stay: 34 Days    INFUSIONS:      VITALS:  Vitals:    12/26/22 1846 01/02/23 0830   Weight: 86.6 kg (190 lb 14.7 oz) 87.5 kg (192 lb 14.4 oz)      Weight change:      Temp:  [35.9 ??C (96.6 ??F)-36.5 ??C (97.7 ??F)] 36.5 ??C (97.7 ??F)  Heart Rate:  [69-96] 84  SpO2 Pulse:  [70-85] 84  Resp:  [17-34] 20  BP: (76-106)/(50-76) 100/58  MAP (mmHg):  [61-84] 71  FiO2 (%):  [50 %] 50 %  SpO2:  [90 %-100 %] 92 %     VENT/OXYGEN SETTINGS:  FiO2 (%): 50 %    INS & OUTS:  Date 01/01/23 1901 - 01/02/23 0700 01/02/23 0701 - 01/03/23 0700   Shift 1901-0700 24 Hour Total 0701-1900 1901-0700 24 Hour Total   INTAKE   P.O.  350 980  980   Shift Total  350 980  980   OUTPUT   Urine 950 1350      Shift Total 950 1350        Intake/Output Summary (Last 24 hours) at 01/02/2023 1711  Last data filed at 01/02/2023 1700  Gross per 24 hour   Intake 980 ml   Output 950 ml   Net 30 ml        ACCESS:  Patient Lines/Drains/Airways Status       Active Active Lines, Drains, & Airways       Name Placement date Placement time Site Days    External Urinary Device 12/02/22 With Suction 12/02/22  1900  -- 30    Peripheral IV 01/01/23 Posterior;Right Forearm 01/01/23  0543  Forearm  1                   Problem: Adult Inpatient Plan of Care  Goal: Plan of Care Review  Outcome: Ongoing - Unchanged  Goal: Patient-Specific Goal (Individualized)  Outcome: Ongoing - Unchanged  Goal: Absence of Hospital-Acquired Illness or Injury  Outcome: Ongoing - Unchanged  Intervention: Identify and Manage Fall Risk  Recent Flowsheet Documentation  Taken 01/02/2023 0830 by Lilyan Gilford, RN  Safety Interventions:   aspiration precautions   bed alarm   commode/urinal/bedpan at bedside   lighting adjusted for tasks/safety   low bed   nonskid shoes/slippers when out of bed  Intervention: Prevent and Manage VTE (Venous Thromboembolism) Risk  Recent Flowsheet Documentation  Taken 01/02/2023 0830 by Lilyan Gilford, RN  VTE Prevention/Management:   anticoagulant therapy   ambulation promoted  Intervention: Prevent Infection  Recent Flowsheet Documentation  Taken 01/02/2023 0830 by Lilyan Gilford, RN  Infection Prevention:   cohorting utilized   equipment surfaces disinfected   hand hygiene promoted   personal protective equipment utilized   rest/sleep promoted   single patient room provided  Goal: Optimal Comfort and Wellbeing  Outcome: Ongoing - Unchanged  Goal: Readiness for Transition of Care  Outcome: Ongoing -  Unchanged  Goal: Rounds/Family Conference  Outcome: Ongoing - Unchanged     Problem: Fall Injury Risk  Goal: Absence of Fall and Fall-Related Injury  Outcome: Ongoing - Unchanged  Intervention: Promote Scientist, clinical (histocompatibility and immunogenetics) Documentation  Taken 01/02/2023 0830 by Lilyan Gilford, RN  Safety Interventions:   aspiration precautions   bed alarm   commode/urinal/bedpan at bedside   lighting adjusted for tasks/safety   low bed   nonskid shoes/slippers when out of bed     Problem: Gas Exchange Impaired  Goal: Optimal Gas Exchange  Outcome: Ongoing - Unchanged  Intervention: Optimize Oxygenation and Ventilation  Recent Flowsheet Documentation  Taken 01/02/2023 1600 by Lilyan Gilford, RN  Head of Bed [facility]) Positioning: (up in the chair.) --  Taken 01/02/2023 1111 by Lilyan Gilford, RN  Head of Bed [facility]) Positioning: HOB at 90 degrees  Taken 01/02/2023 0830 by Lilyan Gilford, RN  Head of Bed [facility]) Positioning: HOB at 45 degrees Problem: Comorbidity Management  Goal: Blood Glucose Levels Within Targeted Range  Outcome: Ongoing - Unchanged  Intervention: Monitor and Manage Glycemia  Recent Flowsheet Documentation  Taken 01/02/2023 0830 by Lilyan Gilford, RN  Glycemic Management:   blood glucose monitored   insulin dose matched to carbohydrate intake  Goal: Blood Pressure in Desired Range  Outcome: Ongoing - Unchanged     Problem: Skin Injury Risk Increased  Goal: Skin Health and Integrity  Outcome: Ongoing - Unchanged  Intervention: Optimize Skin Protection  Recent Flowsheet Documentation  Taken 01/02/2023 1600 by Lilyan Gilford, RN  Head of Bed [facility]) Positioning: (up in the chair.) --  Taken 01/02/2023 1111 by Lilyan Gilford, RN  Head of Bed [facility]) Positioning: HOB at 90 degrees  Taken 01/02/2023 0830 by Lilyan Gilford, RN  Pressure Reduction Techniques:   frequent weight shift encouraged   heels elevated off bed  Head of Bed (HOB) Positioning: HOB at 45 degrees  Pressure Reduction Devices: pressure-redistributing mattress utilized             See flowsheets/MAR for further information      Lilyan Gilford, RN  January 02, 2023 5:11 PM

## 2023-01-04 LAB — BASIC METABOLIC PANEL
ANION GAP: 6 mmol/L (ref 5–14)
ANION GAP: 8 mmol/L (ref 5–14)
BLOOD UREA NITROGEN: 62 mg/dL — ABNORMAL HIGH (ref 9–23)
BLOOD UREA NITROGEN: 63 mg/dL — ABNORMAL HIGH (ref 9–23)
BUN / CREAT RATIO: 26
BUN / CREAT RATIO: 27
CALCIUM: 9.8 mg/dL (ref 8.7–10.4)
CALCIUM: 10.1 mg/dL (ref 8.7–10.4)
CHLORIDE: 95 mmol/L — ABNORMAL LOW (ref 98–107)
CHLORIDE: 93 mmol/L — ABNORMAL LOW (ref 98–107)
CO2: 39 mmol/L — ABNORMAL HIGH (ref 20.0–31.0)
CO2: 37 mmol/L — ABNORMAL HIGH (ref 20.0–31.0)
CREATININE: 2.39 mg/dL — ABNORMAL HIGH
CREATININE: 2.37 mg/dL — ABNORMAL HIGH
EGFR CKD-EPI (2021) FEMALE: 23 mL/min/{1.73_m2} — ABNORMAL LOW (ref >=60)
EGFR CKD-EPI (2021) FEMALE: 23 mL/min/{1.73_m2} — ABNORMAL LOW (ref >=60)
GLUCOSE RANDOM: 88 mg/dL (ref 70–179)
GLUCOSE RANDOM: 124 mg/dL (ref 70–179)
POTASSIUM: 4.1 mmol/L (ref 3.4–4.8)
POTASSIUM: 4.2 mmol/L (ref 3.4–4.8)
SODIUM: 140 mmol/L (ref 135–145)
SODIUM: 138 mmol/L (ref 135–145)

## 2023-01-04 LAB — PROTIME-INR
INR: 1.04
PROTIME: 11.6 s (ref 9.9–12.6)

## 2023-01-04 LAB — CBC
HEMATOCRIT: 33.7 % — ABNORMAL LOW (ref 34.0–44.0)
HEMOGLOBIN: 11.1 g/dL — ABNORMAL LOW (ref 11.3–14.9)
MEAN CORPUSCULAR HEMOGLOBIN CONC: 32.9 g/dL (ref 32.0–36.0)
MEAN CORPUSCULAR HEMOGLOBIN: 28 pg (ref 25.9–32.4)
MEAN CORPUSCULAR VOLUME: 85.2 fL (ref 77.6–95.7)
MEAN PLATELET VOLUME: 8.2 fL (ref 6.8–10.7)
PLATELET COUNT: 140 10*9/L — ABNORMAL LOW (ref 150–450)
RED BLOOD CELL COUNT: 3.95 10*12/L (ref 3.95–5.13)
RED CELL DISTRIBUTION WIDTH: 18.6 % — ABNORMAL HIGH (ref 12.2–15.2)
WBC ADJUSTED: 2.3 10*9/L — ABNORMAL LOW (ref 3.6–11.2)

## 2023-01-04 LAB — MAGNESIUM
MAGNESIUM: 2.3 mg/dL (ref 1.6–2.6)
MAGNESIUM: 2.3 mg/dL (ref 1.6–2.6)

## 2023-01-04 LAB — APTT
APTT: 37.2 s (ref 24.8–38.4)
HEPARIN CORRELATION: 0.2

## 2023-01-04 LAB — PHOSPHORUS
PHOSPHORUS: 6.1 mg/dL — ABNORMAL HIGH (ref 2.4–5.1)
PHOSPHORUS: 4.9 mg/dL (ref 2.4–5.1)

## 2023-01-04 LAB — LACTATE, VENOUS, WHOLE BLOOD
LACTATE BLOOD VENOUS: 1.4 mmol/L (ref 0.5–1.8)
LACTATE BLOOD VENOUS: 1.6 mmol/L (ref 0.5–1.8)

## 2023-01-04 LAB — PRO-BNP: PRO-BNP: 9190 pg/mL — ABNORMAL HIGH (ref <=300.0)

## 2023-01-04 MED ADMIN — ipratropium-albuterol (DUO-NEB) 0.5-2.5 mg/3 mL nebulizer solution 3 mL: 3 mL | RESPIRATORY_TRACT | @ 13:00:00 | Stop: 2023-01-04

## 2023-01-04 MED ADMIN — posaconazole (NOXAFIL) delayed released tablet 200 mg: 200 mg | ORAL | @ 12:00:00

## 2023-01-04 MED ADMIN — gabapentin (NEURONTIN) capsule 100 mg: 100 mg | ORAL | @ 01:00:00

## 2023-01-04 MED ADMIN — gabapentin (NEURONTIN) capsule 100 mg: 100 mg | ORAL | @ 12:00:00

## 2023-01-04 MED ADMIN — iloprost (VENTAVIS) nebulizer solution: 5 ug | RESPIRATORY_TRACT | @ 16:00:00

## 2023-01-04 MED ADMIN — ipratropium-albuterol (DUO-NEB) 0.5-2.5 mg/3 mL nebulizer solution 3 mL: 3 mL | RESPIRATORY_TRACT | @ 08:00:00 | Stop: 2023-01-04

## 2023-01-04 MED ADMIN — iloprost (VENTAVIS) nebulizer solution: 5 ug | RESPIRATORY_TRACT | @ 01:00:00

## 2023-01-04 MED ADMIN — sildenafiL (pulm.hypertension) (REVATIO) tablet 40 mg: 40 mg | ORAL | @ 01:00:00

## 2023-01-04 MED ADMIN — iloprost (VENTAVIS) nebulizer solution: 5 ug | RESPIRATORY_TRACT | @ 04:00:00

## 2023-01-04 MED ADMIN — enoxaparin (LOVENOX) syringe 40 mg: 40 mg | SUBCUTANEOUS | @ 01:00:00

## 2023-01-04 MED ADMIN — insulin lispro (HumaLOG) injection 3 Units: 3 [IU] | SUBCUTANEOUS | @ 13:00:00

## 2023-01-04 MED ADMIN — ipratropium-albuterol (DUO-NEB) 0.5-2.5 mg/3 mL nebulizer solution 3 mL: 3 mL | RESPIRATORY_TRACT | @ 01:00:00

## 2023-01-04 MED ADMIN — iloprost (VENTAVIS) nebulizer solution: 5 ug | RESPIRATORY_TRACT | @ 19:00:00

## 2023-01-04 MED ADMIN — insulin glargine (LANTUS) injection 12 Units: 12 [IU] | SUBCUTANEOUS | @ 01:00:00

## 2023-01-04 MED ADMIN — sildenafiL (pulm.hypertension) (REVATIO) tablet 40 mg: 40 mg | ORAL | @ 12:00:00

## 2023-01-04 MED ADMIN — furosemide (LASIX) tablet 120 mg: 120 mg | ORAL | @ 10:00:00

## 2023-01-04 MED ADMIN — sodium chloride 3 % NEBULIZER solution 4 mL: 4 mL | RESPIRATORY_TRACT | @ 13:00:00 | Stop: 2023-01-04

## 2023-01-04 MED ADMIN — sodium chloride 3 % NEBULIZER solution 4 mL: 4 mL | RESPIRATORY_TRACT | @ 01:00:00

## 2023-01-04 MED ADMIN — insulin lispro (HumaLOG) injection 0-20 Units: 0-20 [IU] | SUBCUTANEOUS | @ 01:00:00

## 2023-01-04 MED ADMIN — furosemide (LASIX) tablet 120 mg: 120 mg | ORAL | @ 19:00:00

## 2023-01-04 MED ADMIN — insulin lispro (HumaLOG) injection 3 Units: 3 [IU] | SUBCUTANEOUS | @ 22:00:00

## 2023-01-04 MED ADMIN — budesonide (PULMICORT) nebulizer solution 0.5 mg: .5 mg | RESPIRATORY_TRACT | @ 13:00:00

## 2023-01-04 MED ADMIN — arformoterol (BROVANA) nebulizer solution 15 mcg/2 mL: 15 ug | RESPIRATORY_TRACT | @ 13:00:00

## 2023-01-04 MED ADMIN — iloprost (VENTAVIS) nebulizer solution: 5 ug | RESPIRATORY_TRACT | @ 13:00:00

## 2023-01-04 MED ADMIN — iloprost (VENTAVIS) nebulizer solution: 5 ug | RESPIRATORY_TRACT

## 2023-01-04 MED ADMIN — sildenafiL (pulm.hypertension) (REVATIO) tablet 40 mg: 40 mg | ORAL | @ 17:00:00

## 2023-01-04 MED ADMIN — oxyCODONE (ROXICODONE) immediate release tablet 2.5 mg: 2.5 mg | ORAL | @ 11:00:00 | Stop: 2023-01-04

## 2023-01-04 MED ADMIN — lidocaine (ASPERCREME) 4 % 1 patch: 1 | TRANSDERMAL | @ 12:00:00

## 2023-01-04 MED ADMIN — gabapentin (NEURONTIN) capsule 100 mg: 100 mg | ORAL | @ 17:00:00

## 2023-01-04 MED ADMIN — azithromycin (ZITHROMAX) tablet 250 mg: 250 mg | ORAL | @ 12:00:00 | Stop: 2025-08-25

## 2023-01-04 NOTE — Unmapped (Signed)
Patient on 8LPM LBNC.  No distress noted.

## 2023-01-04 NOTE — Unmapped (Signed)
Pt is A&Ox4. VSS, NSR, 1st degree AV block. Sys BP goal: 85+. O2 sats >88% on 7L large bore [place]. Pulmonology consulted for AVAP usage, BiPAP used at bedtime. Afebrile. Chronic back pain noted. UO adequate, net negative goal of - , 0 BMs this shift. Pt's appetite has not been adequate. Scattered bruising noted, generalized weakness, pt. Repositions self, and standard precautions maintained. No falls/injuries this shift. All monitors with appropriate alarm settings, call bell within reach, see flowsheets/MAR for further info.       Problem: Adult Inpatient Plan of Care  Goal: Plan of Care Review  Outcome: Ongoing - Unchanged  Goal: Patient-Specific Goal (Individualized)  Outcome: Ongoing - Unchanged  Goal: Absence of Hospital-Acquired Illness or Injury  Outcome: Ongoing - Unchanged  Intervention: Identify and Manage Fall Risk  Recent Flowsheet Documentation  Taken 01/03/2023 0800 by Lendell Caprice, RN  Safety Interventions:   aspiration precautions   bleeding precautions   environmental modification   fall reduction program maintained   infection management   lighting adjusted for tasks/safety   nonskid shoes/slippers when out of bed  Intervention: Prevent Skin Injury  Recent Flowsheet Documentation  Taken 01/03/2023 1600 by Lendell Caprice, RN  Positioning for Skin: Bed in Chair  Taken 01/03/2023 1400 by Lendell Caprice, RN  Positioning for Skin: Bed in Chair  Taken 01/03/2023 1200 by Lendell Caprice, RN  Positioning for Skin: Bed in Chair  Taken 01/03/2023 1000 by Lendell Caprice, RN  Positioning for Skin: Bed in Chair  Taken 01/03/2023 0800 by Lendell Caprice, RN  Positioning for Skin: Bed in Chair  Intervention: Prevent Infection  Recent Flowsheet Documentation  Taken 01/03/2023 0800 by Lendell Caprice, RN  Infection Prevention:   environmental surveillance performed   equipment surfaces disinfected   hand hygiene promoted   personal protective equipment utilized   single patient room provided  Goal: Optimal Comfort and Wellbeing  Outcome: Ongoing - Unchanged  Goal: Readiness for Transition of Care  Outcome: Ongoing - Unchanged  Goal: Rounds/Family Conference  Outcome: Ongoing - Unchanged     Problem: Fall Injury Risk  Goal: Absence of Fall and Fall-Related Injury  Outcome: Ongoing - Unchanged  Intervention: Promote Injury-Free Environment  Recent Flowsheet Documentation  Taken 01/03/2023 0800 by Lendell Caprice, RN  Safety Interventions:   aspiration precautions   bleeding precautions   environmental modification   fall reduction program maintained   infection management   lighting adjusted for tasks/safety   nonskid shoes/slippers when out of bed     Problem: Gas Exchange Impaired  Goal: Optimal Gas Exchange  Outcome: Ongoing - Unchanged  Intervention: Optimize Oxygenation and Ventilation  Recent Flowsheet Documentation  Taken 01/03/2023 1600 by Lendell Caprice, RN  Head of Bed [facility]) Positioning: HOB at 30-45 degrees  Taken 01/03/2023 1552 by Lendell Caprice, RN  Head of Bed [facility]) Positioning: HOB at 30-45 degrees  Taken 01/03/2023 1400 by Lendell Caprice, RN  Head of Bed [facility]) Positioning: HOB at 30-45 degrees  Taken 01/03/2023 1200 by Lendell Caprice, RN  Head of Bed [facility]) Positioning: HOB at 30-45 degrees  Taken 01/03/2023 1000 by Lendell Caprice, RN  Head of Bed [facility]) Positioning: HOB at 60-90 degrees  Taken 01/03/2023 0800 by Lendell Caprice, RN  Head of Bed [facility]) Positioning: HOB at 60-90 degrees  Taken 01/03/2023 0713 by Lendell Caprice, RN  Head of Bed [facility]) Positioning: HOB elevated     Problem: Comorbidity Management  Goal: Blood Glucose Levels Within Targeted Range  Outcome: Ongoing - Unchanged  Goal:  Blood Pressure in Desired Range  Outcome: Ongoing - Unchanged     Problem: Skin Injury Risk Increased  Goal: Skin Health and Integrity  Outcome: Ongoing - Unchanged  Intervention: Optimize Skin Protection  Recent Flowsheet Documentation  Taken 01/03/2023 1600 by Lendell Caprice, RN  Pressure Reduction Techniques:   frequent weight shift encouraged   heels elevated off bed  Head of Bed [facility]) Positioning: HOB at 30-45 degrees  Taken 01/03/2023 1552 by Lendell Caprice, RN  Head of Bed [facility]) Positioning: HOB at 30-45 degrees  Taken 01/03/2023 1400 by Lendell Caprice, RN  Pressure Reduction Techniques:   heels elevated off bed   frequent weight shift encouraged  Head of Bed [facility]) Positioning: HOB at 30-45 degrees  Taken 01/03/2023 1200 by Lendell Caprice, RN  Pressure Reduction Techniques:   frequent weight shift encouraged   heels elevated off bed  Head of Bed [facility]) Positioning: HOB at 30-45 degrees  Taken 01/03/2023 1000 by Lendell Caprice, RN  Pressure Reduction Techniques:   frequent weight shift encouraged   heels elevated off bed  Head of Bed [facility]) Positioning: HOB at 60-90 degrees  Taken 01/03/2023 0800 by Lendell Caprice, RN  Activity Management: patient refuses activity  Pressure Reduction Techniques:   frequent weight shift encouraged   heels elevated off bed  Head of Bed [facility]) Positioning: HOB at 60-90 degrees  Taken 01/03/2023 0713 by Lendell Caprice, RN  Head of Bed [facility]) Positioning: HOB elevated

## 2023-01-04 NOTE — Unmapped (Signed)
Infectious Disease ([facility]) Progress Note    Assessment & Plan:   Karen Barber is a 57 y.o. female whose presentation is complicated by bronchiectasis type 1 in the setting of pulmonary sarcoidosis (6L [place] home O2), HTN, T2DM, COPD, s/p LUL lobectomy, and asthma that presented to [facility] admitted to MICU after becoming hypoxic after holding home meds prior to PET scan. After 1 week diuresing in ICU, patient stabilized and transferred to the MPCU.    Principal Problem:    Acute on chronic hypoxic respiratory failure (CMS-HCC)  Active Problems:    Pulmonary sarcoidosis (CMS-HCC)    Type 2 diabetes mellitus with hyperglycemia (CMS-HCC)    COPD (chronic obstructive pulmonary disease) (CMS-HCC)    Chronic pulmonary aspergillosis (CMS-HCC)    Pulmonary hypertension (CMS-HCC)    AKI (acute kidney injury) (CMS-HCC)    Active Problems  #Acute on Chronic Hypoxemic Respiratory Failure -   #Pulmonary Sarcoidosis -   #COPD/asthma -   #Bronchiectasis  Baseline oxygen 6L.  Recent admission with pneumonia growing Serratia marcescens. Patient held her morning medications prior to outpatient PET CT scan during which she became hypoxic to the 70s and admitted to MICU.  CXR consistent with extensive pulmonary fibrosis. She was weaned from high flow nasal cannula to large bore nasal cannula after IV diuresis and antibiotics. PET scan 9/18 is not indicative of active pulmonary sarcoidosis. RHC 9/20 showing significant pulm HTN disease with mean PA pressure of 47, for which pulmonology team started Iloprost. Notably, wedge pressure on that study was 5, which is reassuring against volume as driver of her respiratory status at that time. Patient has been having variations in oxygen requirement.  Received IV diuresis followed by transition to oral with 120 mg Lasix BID.  -- Continue to wean O2 with goal saturations of 90-94% per pulmonology, currently requiring 7-8L.  -- Plan to get oxymizer for her while admitted. Will need oxymizer at home  -- Continue home airway clearance (DuoNebs and HTS 3 times daily with [place])  -- Continue home Pulmicort, Brovana, Atrovent, as needed albuterol, and azithromycin  -- PT/OT: 5xL  -- Standing weights daily  -- Nephrology recommend continuing 120 mg PO Lasix BID plan to sign-off. Recommend outpatient follow-up  -- Will benefit from ongoing GOC conversations. Palliative care previously consulted however may benefit from re-consult.     #Pulmonary hypertension - RV failure - Orthostatic hypotension  History of pulmonary hypertension secondary to pulmonary sarcoidosis/COPD.  TTE this admission with severely dilated right atrium with severe pulmonary hypertension PASP 86 mmHg.  Estimated right atrial pressure 5 to 10 mmHg.  On Lasix 80 mg twice daily at home.  Echo showed LVEF > 55% and severe pulmonary hypertension without significant change from prior.  Right heart cath showed severe pulmonary hypertension, markedly elevated PVR (9.3 - 10.5) and low cardiac output / index. HRCT scan shows unchanged fibrotic pulmonary sarcoidosis with bilateral segmental and subsegmental fibrosis post left upper lobectomy with unchanged apical right upper lobe and superior segment left lower lobe bullae.   -- Continue inhaled iloprost 5 mcg 6 times a day q4h; please do not mix with other nebulizer solutions   -- Plan to transition to Tyvaso nebs, working with pharmacy and [facility] nurse coordinator  -- [facility] rep to do teaching prior to DC  -- Discussion with pulmonology regarding future goals for patient    #Acute kidney injury on CKD3  Creatinine 1.3 on admission which seems to be baseline. Hospital course with rise of creatinine  up to 3.   -- Creatinine 2.39 today  -- Renally dose medications  -- Daily BMP    #Secondary Hyperparathyroidism, Hypocalcemia, and Hyperphosphatemia  Secondary hyperparathyroidism due to CKD.  -- Reconsider need for future oral calcium carbonate 1600 mg TID  -- Reconsider need for future phoslo 667 mg PO TID   -- Reassess PTH levels in 4-6 weeks. Last checked 9/22.    #Type 2 diabetes mellitus with hyperglycemia and hypoglycemia  Hemoglobin A1c 8.3%.  Home regimen includes glargine 18 units nightly and sliding scale insulin.  Large fluctuations in glucose greater than 300 and occasional values ~50.  -- Sliding scale insulin insulin sensitivity factor 40  --12 glargine nightly  -- 3 units lispro tid      Chronic Problems     #Chronic Pain   - Gabapentin resumed at reduced dose     #Chronic pulmonary aspergillosis  On chronic posaconazole. Soluble IL-2 receptor, Aspergillus Galactomannan Antigen, IgE checked 10/2022 all wnl  -Continue home posaconazole Ppx  -Outpatient evaluation for [facility] scheduled for 10/25 to evaluate recurrent hemoptysis     #Iron deficiency anemia  #Chronic normocytic anemia (baseline hemoglobin 11)  --Iron supplementation with iron dextran 1,000mg  in 250cc on 9/23  --Iron level improving but still low   --Considering another supplementation with dextran    #Hyperlipidemia  LDL of 146  -- Follow-up outpatient    Issues Impacting Complexity of Management:  -The patient is at high risk for the development of complications of volume overload due to the need to provide IV hydration for suspected hypovolemia in the setting of: Pulmonary hypertension  -Need for the following intensive monitoring parameter(s) due to high risk of clinical decline: continuous oxygen monitoring, telemetry, and frequent monitoring of urine output to assess for the efficacy of diuretic regimen  -Need for intensive oxygen therapy of HFNC, which places the patient at high risk for oxygen toxicity  -The patient is at high risk of complications from pulmonary hypertension    Daily Checklist:  Diet: Regular Diet   DVT PPx: Lovenox 40mg  q24h  Electrolytes: Replete Potassium to >/= 3.6 and Magnesium to >/= 1.8  Code Status: DNR and DNI  Dispo:  Continue MPCU    Team Contact Information:   Primary Team: Infectious Disease ([facility])  Primary Resident: Sindy Guadeloupe, DO  Resident's Pager: [phone] (Infect Disease Intern - Tower)    Interval History:     No acute events overnight.     Patient required 1 prn 2.5 mg oxycodone for low back pain. She had great UOP yesterday, close to 2L with net negative of 1,575 mL. JVP today about the same as yesterday. She denies any lightheadedness, dizziness, headache related to diuresis. She has been unable to participate with PT/OT that last few days due to severe shortness of breath. Encouraged her to try to work with them today. Plan today to discuss with nephrology and pulmonology regarding setting expectations for future.    ROS negative except for where otherwise noted above.    Objective:   Temp:  [35.8 ??C (96.5 ??F)-36.8 ??C (98.2 ??F)] 36.8 ??C (98.2 ??F)  Heart Rate:  [70-85] 70  SpO2 Pulse:  [74-85] 74  Resp:  [12-28] 28  BP: (91-105)/(56-70) 99/70  FiO2 (%):  [45 %-50 %] 45 %  SpO2:  [83 %-97 %] 97 %,   Intake/Output Summary (Last 24 hours) at 01/04/2023 0817  Last data filed at 01/04/2023 0400  Gross per 24 hour   Intake 550 ml  Output 2125 ml   Net -1575 ml   , S RR:  [12] 12  FiO2 (%):  [45 %-50 %] 45 %  O2 Device: BiPAP  O2 Flow Rate (L/min):  [7 L/min-9 L/min] 7 L/min    Gen: NAD, converses well in full sentences  HENT: Atraumatic, normocephalic  Heart: RRR,   Lungs: Crackles at bilateral lung bases  Abdomen: Soft, nontender nondistended  Extremities: No lower extremity edema

## 2023-01-04 NOTE — Unmapped (Signed)
Patient was compliant with all inhaled scheduled medications and was under no apparent distress. Pt is currently on AVAPS.

## 2023-01-04 NOTE — Unmapped (Signed)
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  On 10/7 began rising after diuresis of 2.7 L of urine on 10/6, and creatinine up to 2.95 at the time of consult.  DDx of prerenal from relative volume depletion, cardiorenal given narrow threshold for forward flow, congestion though suspect this is not the case in the setting of good urine output and rising CO2 despite BNP's rising over the last few days.  Volume status exam is extremely challenging in the setting of her pulmonary hypertension but fortunately has stabilized and improved after gentle diuresis yesterday favoring cardiorenal congestive AKI rather than the opposite with improvement in her BNP. This reinforces a very narrow window of euvolemia  - Sediment 10/9 bland  - Pt oxygenation slightly above baseline at 7 L from home 6L.   - weight relatively unchanged since coming to ED 85-87 kg, pending weight this AM  - Great response to furosemide 120mg  BID oral, cont on dc, follow up with labs 2-3 days after Discharge to assess potassium and creatinine  - will refer to [facility] clinic with nephrology, pt is agreeable, our office will call     # Pulmonary sarcoidosis complicated by pulmonary hypertension on chronic oxygen  - Evaluation and management per primary team  - No changes to management from a nephrology standpoint at this time    RECOMMENDATIONS:   - cont diuresis with  furosemide 120 mg oral BID today and on dc  -outpt nephrology follow up with [facility], we will call to set up appt  Repeat labs 2-3 days post discharge  to assess potassium and creatinine  Strict I and O  Recc daily weight  - We will continue to follow.     [name], MD  01/04/2023 9:00 AM     Medical decision-making for 01/04/23  Findings / Data     Patient has: []  acute illness w/systemic sxs  [mod]  []  two or more stable chronic illnesses [mod]  []  one chronic illness with acute exacerbation [mod]  []  acute complicated illness  [mod]  []  Undiagnosed new problem with uncertain prognosis  [mod] [x]  illness posing risk to life or bodily function (ex. AKI)  [high]  []  chronic illness with severe exacerbation/progression  [high]  []  chronic illness with severe side effects of treatment  [high] AKI Probs At least 2:  Probs, Data, Risk   I reviewed: [x]  primary team note  [x]  consultant note(s)  []  external records [x]  chemistry results  []  CBC results  []  blood gas results  []  Other []  procedure/op note(s)   []  radiology report(s)  []  micro result(s)  []  w/ independent historian(s) Pulm notes reviewed, Cr 2.6 stable; diuresing >=3 Data Review (2 of 3)    I independently interpreted: []  Urine Sediment  []  Renal US []  CXR Images  []  CT Images  []  Other []  EKG Tracing  Any     I discussed: []  Pathology results w/ QHPs(s) from other specialties  []  Procedural findings w/  QHPs(s) from other specialties []  Imaging w/ QHP(s) from other specialties  [x]  Treatment plan w/ QHP(s) from other specialties Plan discussed with primary team Any     Mgm't requires: []  Prescription drug(s)  [mod]  []  Kidney biopsy  [mod]  []  Central line placement  [mod] []  High risk medication use and/or intensive toxicity monitoring [high]  []  Renal replacement therapy [high]  []  High risk kidney biopsy  [high]  []  Escalation of care  [high]  []  High risk central line placement  [high] IV Diuresis: check BMP/Mg  Risk      ____________________________________________________  Interval Hx  Pt feels well. She is very pleased with breathing. Machine not delivered to home so she is still in the hospital. No complaints. Denies CP, SOB, N, v, diarrhea, abd pain  Will follow up with nephro on discharge      History of Present Illness: Karen Barber is 57 y.o. 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 Nephrology has been consulted for AKI.     Patient was initially admitted 9/11 when she presented for a PET scan to evaluate her sarcoidosis but unfortunately was found to be hypoxemic on her home 6 L of nasal cannula requiring high flow nasal cannula and was admitted initially to the ICU.  She was diuresed and her oxygenation improved and she was stepped down to the MPCU.  She was treated with antibiotics as well as steroids for infection and bronchiectasis.  She underwent right heart cath on 9/20 with elevated mean PA pressures and was started on iloprost.  Notes that she has not been told she has kidney issues prior to this as she regularly follows up with a PCP.  She notes that she is not eating or drinking very well as she is worried about accumulating fluid whenever she eats salt and she does not like the food here.  Denies nausea.    Lasix dosing over the last few days has included 80 mg IV once or twice twice a day till 10/6, with diuresis holiday 10/7 and then given 60 mg IV Lasix on 10/8.    Patient does note that she is urinating less over the last few days and has to push a little bit to empty her bladder at the end.    Of note O2 requirement is gradually increasing from baseline of 6 L nasal cannula on 10/6 and 10/7 up to 9 and up to 10 L on day of consult.  Urine output of 300 up to 700 today.  Curiously proBNP has been rising the last few days though team has felt she is relatively euvolemic during this time.    Creatinine recently has steady down in the 1.6-1.7 range however starting 10/7 rising up and up to 2.95 on day of consult    INPATIENT MEDICATIONS:    Current Facility-Administered Medications:     acetaminophen (TYLENOL) tablet 650 mg, Oral, Q6H    albuterol 2.5 mg /3 mL (0.083 %) nebulizer solution 2.5 mg, Nebulization, Q6H PRN    aluminum-magnesium hydroxide-simethicone (MAALOX MAX) 80-80-8 mg/mL oral suspension, Oral, Q6H PRN    arformoterol (BROVANA) nebulizer solution 15 mcg/2 mL, Nebulization, BID (RT)    azithromycin (ZITHROMAX) tablet 250 mg,  Oral, Daily    budesonide (PULMICORT) nebulizer solution 0.5 mg, Nebulization, BID (RT)    calcium carbonate (TUMS) chewable tablet 200 mg elem calcium, Oral, TID PRN    dextrose (D10W) 10% bolus 125 mL, Intravenous, Q10 Min PRN    diclofenac sodium (VOLTAREN) 1 % gel 2 g, Topical, QID    enoxaparin (LOVENOX) syringe 40 mg, Subcutaneous, Nightly    flu vaccine TS 2024-25(75mos up)(PF)(FLULAVAL, FLUARIX, FLUZONE), Intramuscular, During hospitalization    furosemide (LASIX) tablet 120 mg, Oral, BID    gabapentin (NEURONTIN) capsule 100 mg, Oral, TID    glucagon injection 1 mg, Intramuscular, Once PRN    iloprost (VENTAVIS) nebulizer solution, Nebulization, 6XD    insulin glargine (LANTUS) injection 12 Units, Subcutaneous, Nightly    insulin lispro (HumaLOG) injection 0-20 Units, Subcutaneous, ACHS    insulin lispro (HumaLOG) injection 3 Units, Subcutaneous, 3xd Meals    ipratropium-albuterol (DUO-NEB) 0.5-2.5 mg/3 mL nebulizer solution 3 mL, Nebulization, Q6H (RT)    lidocaine (ASPERCREME) 4 % 1 patch, Transdermal, Daily    [Provider Hold] magnesium oxide-Mg AA chelate (Magnesium Plus Protein) 2 tablet, Oral, BID    melatonin tablet 6 mg, Oral, Nightly PRN    ondansetron (ZOFRAN-ODT) disintegrating tablet 4 mg, Oral, Q8H PRN **OR** ondansetron (ZOFRAN) injection 4 mg, Intravenous, Q8H PRN    polyethylene glycol (MIRALAX) packet 17 g, Oral, Daily PRN    posaconazole (NOXAFIL) delayed released tablet 200 mg, Oral, Daily    prochlorperazine (COMPAZINE) tablet 5 mg, Oral, Q6H PRN    senna (SENOKOT) tablet 2 tablet, Oral, Nightly PRN    sildenafiL (pulm.hypertension) (REVATIO) tablet 40 mg, Oral, TID    sodium chloride (OCEAN) 0.65 % nasal spray 1 spray, Each Nare, Q6H PRN    sodium chloride 3 % NEBULIZER solution 4 mL, Nebulization, BID    OUTPATIENT MEDICATIONS:  Prior to Admission medications    Medication Dose, Route, Frequency   ACCU-CHEK GUIDE GLUCOSE METER Misc 1 Device, Other, 4 times a day (ACHS), AS DIRECTED   albuterol 2.5 mg /3 mL (0.083 %) nebulizer solution 2.5 mg, Nebulization, Every 6 hours PRN   albuterol HFA 90 mcg/actuation inhaler 2 puffs, Inhalation, Every 4 hours PRN   arformoterol (BROVANA) 15 mcg/2 mL 15 mcg, Nebulization, 2 times daily (RT)   aspirin-caffeine (BAYER BACK AND BODY) 500-32.5 mg Tab Oral, Takes 2 1/2 pills 3-4 times a day as needed.   azithromycin (ZITHROMAX) 250 MG tablet 250 mg, Oral, Every 24 hours, Start on 9/1 after completing the levofloxacin   blood sugar diagnostic Strp Other, 3 times a day (standard), ACCU-CHEK Guide meter   budesonide (PULMICORT) 0.5 mg/2 mL nebulizer solution 0.5 mg, Nebulization, 2 times daily (RT)   furosemide (LASIX) 80 MG tablet 80 mg, Oral, 2 times a day (standard), Take a third dose as needed for increasing weight or increasing swelling.   gabapentin (NEURONTIN) 300 MG capsule 300 mg, Oral, 3 times a day (standard)   insulin aspart (NOVOLOG U-100 INSULIN ASPART) 100 unit/mL vial 1-20 Units, Subcutaneous, 4 times a day (ACHS), SLIDING SCALE   insulin glargine (BASAGLAR KWIKPEN U-100 INSULIN) 100 unit/mL (3 mL) injection pen 18 Units, Subcutaneous, Nightly   ipratropium (ATROVENT) 0.02 % nebulizer solution 500 mcg, Nebulization, 4 times a day   posaconazole (NOXAFIL) 100 mg delayed released tablet 200 mg, Oral, Daily (standard)   sildenafiL, pulm.hypertension, (REVATIO) 20 mg tablet 20 mg, Oral, 3 times a day (standard)   OXYGEN-AIR DELIVERY SYSTEMS MISC Dose: 2-3 LPM  ALLERGIES:  Patient has no known allergies.    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 A.niger from sputum 08/2016-09/2016.    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    Sarcoidosis 1995    Type 2 diabetes mellitus with hyperglycemia (CMS-HCC) 07/27/2014    Visual impairment     glasses     Past Surgical History:   Procedure Laterality Date    LUNG LOBECTOMY      PR AMPUTATION TOE,MT-P JT Right 12/12/2019    Procedure: Right foot fifth toe amputation at the metatarsophalangeal joint level;  Surgeon: Britt Bottom, DPM;  Location: MAIN OR [facility];  Service: Vascular    PR AMPUTATION TOE,MT-P JT Right 06/16/2020    Procedure: Right foot amputation of 3rd toe at mpj level;  Surgeon: Britt Bottom, DPM;  Location: MAIN OR [facility];  Service: Vascular    PR RIGHT HEART CATH O2 SATURATION & CARDIAC OUTPUT N/A 04/29/2019    Procedure: Right Heart Catheterization;  Surgeon: Rosana Hoes, MD;  Location: [facility] CATH;  Service: Cardiology    PR RIGHT HEART CATH O2 SATURATION & CARDIAC OUTPUT N/A 12/08/2022    Procedure: Right Heart Catheterization;  Surgeon: Neal Dy, MD;  Location: [facility] CATH; Service: Cardiology    PR THORACOSCOPY SURG TOT PULM DECORT Left 12/14/2014    Procedure: THORACOSCOPY SURG; W/TOT PULM DECORTIC/PNEUMOLYS;  Surgeon: Evert Kohl, MD;  Location: MA[facility];  Service: Cardiothoracic    PR THORACOSCOPY W/THERA WEDGE RESEXN INITIAL UNILAT Left 12/03/2014    Procedure: THORACOSCOPY, SURGICAL; WITH THERAPEUTIC WEDGE RESECTION (EG, MASS, NODULE) INITIAL UNILATERAL;  Surgeon: Evert Kohl, MD;  Location: MAIN OR [facility];  Service: Cardiothoracic     SOCIAL HISTORY  Social History     Social History Narrative    Not on file      reports that she quit smoking about 35 years ago. Her smoking use included cigarettes. She started smoking about 44 years ago. She has a 4.5 pack-year smoking history. She has never used smokeless tobacco. She reports that she does not drink alcohol and does not use drugs.   FAMILY HISTORY  Family History   Problem Relation Age of Onset    Dementia Mother     Diabetes Father     Diabetes Brother     Diabetes Maternal Aunt     Sarcoidosis Maternal Aunt     Diabetes Maternal Uncle     Diabetes Paternal Aunt     Diabetes Paternal Uncle         Physical Exam:   Vitals:    01/03/23 2040 01/03/23 2338 01/04/23 0345 01/04/23 0800   BP: 105/68 91/65 99/70     Pulse: 79 79 70  Resp: 22 21 28     Temp: 36.4 ??C (97.5 ??F) 35.9 ??C (96.6 ??F) 35.8 ??C (96.5 ??F) 36.8 ??C (98.2 ??F)   TempSrc: Axillary Axillary Axillary Oral   SpO2: 95%  97%    Weight:       Height:         No intake/output data recorded.    Intake/Output Summary (Last 24 hours) at 01/04/2023 0900  Last data filed at 01/04/2023 0400  Gross per 24 hour   Intake 550 ml   Output 2125 ml   Net -1575 ml     Constitutional: well-appearing, no acute distress sitting up in bed  Heart: RRR, no m/r/g  Lungs: LLL crackles appreciated, getting breathing treatment, on [place]  Abd: non-distended  Ext: No significant edema

## 2023-01-04 NOTE — Unmapped (Signed)
Pt placed on AVAPS charted settings.  Pt was on 7-9L [facility] [place] through out the day with frequent desat episodes.   Treatments given as ordered.

## 2023-01-05 LAB — CBC
HEMATOCRIT: 36.4 % (ref 34.0–44.0)
HEMOGLOBIN: 11.7 g/dL (ref 11.3–14.9)
MEAN CORPUSCULAR HEMOGLOBIN CONC: 32.2 g/dL (ref 32.0–36.0)
MEAN CORPUSCULAR HEMOGLOBIN: 27.2 pg (ref 25.9–32.4)
MEAN CORPUSCULAR VOLUME: 84.7 fL (ref 77.6–95.7)
MEAN PLATELET VOLUME: 9 fL (ref 6.8–10.7)
PLATELET COUNT: 186 10*9/L (ref 150–450)
RED BLOOD CELL COUNT: 4.3 10*12/L (ref 3.95–5.13)
RED CELL DISTRIBUTION WIDTH: 17.9 % — ABNORMAL HIGH (ref 12.2–15.2)
WBC ADJUSTED: 3.3 10*9/L — ABNORMAL LOW (ref 3.6–11.2)

## 2023-01-05 LAB — MAGNESIUM
MAGNESIUM: 2.1 mg/dL (ref 1.6–2.6)
MAGNESIUM: 2.1 mg/dL (ref 1.6–2.6)

## 2023-01-05 LAB — BASIC METABOLIC PANEL
ANION GAP: 5 mmol/L (ref 5–14)
ANION GAP: 9 mmol/L (ref 5–14)
BLOOD UREA NITROGEN: 63 mg/dL — ABNORMAL HIGH (ref 9–23)
BLOOD UREA NITROGEN: 63 mg/dL — ABNORMAL HIGH (ref 9–23)
BUN / CREAT RATIO: 26
BUN / CREAT RATIO: 25
CALCIUM: 9.8 mg/dL (ref 8.7–10.4)
CALCIUM: 10.2 mg/dL (ref 8.7–10.4)
CHLORIDE: 93 mmol/L — ABNORMAL LOW (ref 98–107)
CHLORIDE: 92 mmol/L — ABNORMAL LOW (ref 98–107)
CO2: 38 mmol/L — ABNORMAL HIGH (ref 20.0–31.0)
CO2: 37 mmol/L — ABNORMAL HIGH (ref 20.0–31.0)
CREATININE: 2.41 mg/dL — ABNORMAL HIGH
CREATININE: 2.5 mg/dL — ABNORMAL HIGH
EGFR CKD-EPI (2021) FEMALE: 23 mL/min/{1.73_m2} — ABNORMAL LOW (ref >=60)
EGFR CKD-EPI (2021) FEMALE: 22 mL/min/{1.73_m2} — ABNORMAL LOW (ref >=60)
GLUCOSE RANDOM: 148 mg/dL (ref 70–179)
GLUCOSE RANDOM: 170 mg/dL (ref 70–179)
POTASSIUM: 4 mmol/L (ref 3.4–4.8)
POTASSIUM: 4 mmol/L (ref 3.4–4.8)
SODIUM: 136 mmol/L (ref 135–145)
SODIUM: 138 mmol/L (ref 135–145)

## 2023-01-05 LAB — PHOSPHORUS
PHOSPHORUS: 5.1 mg/dL (ref 2.4–5.1)
PHOSPHORUS: 4.9 mg/dL (ref 2.4–5.1)

## 2023-01-05 LAB — LACTATE, VENOUS, WHOLE BLOOD: LACTATE BLOOD VENOUS: 1.7 mmol/L (ref 0.5–1.8)

## 2023-01-05 MED ADMIN — iloprost (VENTAVIS) nebulizer solution: 5 ug | RESPIRATORY_TRACT | @ 19:00:00

## 2023-01-05 MED ADMIN — albuterol 2.5 mg /3 mL (0.083 %) nebulizer solution 2.5 mg: 2.5 mg | RESPIRATORY_TRACT | @ 22:00:00

## 2023-01-05 MED ADMIN — budesonide (PULMICORT) nebulizer solution 0.5 mg: .5 mg | RESPIRATORY_TRACT | @ 12:00:00

## 2023-01-05 MED ADMIN — insulin lispro (HumaLOG) injection 3 Units: 3 [IU] | SUBCUTANEOUS | @ 20:00:00

## 2023-01-05 MED ADMIN — gabapentin (NEURONTIN) capsule 100 mg: 100 mg | ORAL | @ 12:00:00

## 2023-01-05 MED ADMIN — iloprost (VENTAVIS) nebulizer solution: 5 ug | RESPIRATORY_TRACT | @ 13:00:00

## 2023-01-05 MED ADMIN — insulin glargine (LANTUS) injection 12 Units: 12 [IU] | SUBCUTANEOUS | @ 02:00:00

## 2023-01-05 MED ADMIN — Pfizer COVID-19 Vaccine (12 yr+) (2024-2025) pre-filled syringe: .3 mL | INTRAMUSCULAR | Stop: 2023-01-05

## 2023-01-05 MED ADMIN — insulin lispro (HumaLOG) injection 0-20 Units: 0-20 [IU] | SUBCUTANEOUS | @ 02:00:00

## 2023-01-05 MED ADMIN — diph,pertuss(acel),tetanus vaccine-Tdap (BOOSTRIX) injection 0.5 mL: .5 mL | INTRAMUSCULAR | Stop: 2023-01-05

## 2023-01-05 MED ADMIN — sildenafiL (pulm.hypertension) (REVATIO) tablet 40 mg: 40 mg | ORAL | @ 12:00:00

## 2023-01-05 MED ADMIN — sodium chloride 3 % NEBULIZER solution 4 mL: 4 mL | RESPIRATORY_TRACT | @ 12:00:00

## 2023-01-05 MED ADMIN — arformoterol (BROVANA) nebulizer solution 15 mcg/2 mL: 15 ug | RESPIRATORY_TRACT

## 2023-01-05 MED ADMIN — azithromycin (ZITHROMAX) tablet 250 mg: 250 mg | ORAL | @ 12:00:00 | Stop: 2025-08-25

## 2023-01-05 MED ADMIN — furosemide (LASIX) tablet 120 mg: 120 mg | ORAL | @ 18:00:00

## 2023-01-05 MED ADMIN — gabapentin (NEURONTIN) capsule 100 mg: 100 mg | ORAL | @ 18:00:00

## 2023-01-05 MED ADMIN — enoxaparin (LOVENOX) syringe 40 mg: 40 mg | SUBCUTANEOUS | @ 01:00:00

## 2023-01-05 MED ADMIN — iloprost (VENTAVIS) nebulizer solution: 5 ug | RESPIRATORY_TRACT | @ 16:00:00

## 2023-01-05 MED ADMIN — sildenafiL (pulm.hypertension) (REVATIO) tablet 40 mg: 40 mg | ORAL | @ 01:00:00

## 2023-01-05 MED ADMIN — sodium chloride 3 % NEBULIZER solution 4 mL: 4 mL | RESPIRATORY_TRACT

## 2023-01-05 MED ADMIN — ipratropium-albuterol (DUO-NEB) 0.5-2.5 mg/3 mL nebulizer solution 3 mL: 3 mL | RESPIRATORY_TRACT

## 2023-01-05 MED ADMIN — posaconazole (NOXAFIL) delayed released tablet 200 mg: 200 mg | ORAL | @ 12:00:00

## 2023-01-05 MED ADMIN — gabapentin (NEURONTIN) capsule 100 mg: 100 mg | ORAL | @ 01:00:00

## 2023-01-05 MED ADMIN — iloprost (VENTAVIS) nebulizer solution: 5 ug | RESPIRATORY_TRACT | @ 22:00:00

## 2023-01-05 MED ADMIN — iloprost (VENTAVIS) nebulizer solution: 5 ug | RESPIRATORY_TRACT | @ 05:00:00

## 2023-01-05 MED ADMIN — furosemide (LASIX) tablet 120 mg: 120 mg | ORAL | @ 11:00:00

## 2023-01-05 MED ADMIN — sildenafiL (pulm.hypertension) (REVATIO) tablet 40 mg: 40 mg | ORAL | @ 18:00:00

## 2023-01-05 MED ADMIN — budesonide (PULMICORT) nebulizer solution 0.5 mg: .5 mg | RESPIRATORY_TRACT

## 2023-01-05 MED ADMIN — arformoterol (BROVANA) nebulizer solution 15 mcg/2 mL: 15 ug | RESPIRATORY_TRACT | @ 12:00:00

## 2023-01-05 MED ADMIN — iloprost (VENTAVIS) nebulizer solution: 5 ug | RESPIRATORY_TRACT | @ 07:00:00

## 2023-01-05 MED ADMIN — ipratropium-albuterol (DUO-NEB) 0.5-2.5 mg/3 mL nebulizer solution 3 mL: 3 mL | RESPIRATORY_TRACT | @ 12:00:00

## 2023-01-05 NOTE — Unmapped (Signed)
Patient on 8LPM LBNC.  No distress noted.

## 2023-01-05 NOTE — Unmapped (Signed)
Pulmonary Consult Service  Follow-Up Note      Primary Service:   Medicine - MedK  Primary Service Attending:  Hulen Skains, MD  Reason for Consult:   Assistance with Diuresis, Pulmonary HTN    ASSESSMENT and PLAN     Karen Barber is a 57 y.o. 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 Pt was found to have acute on chronic respiratory failure (sat in 70s on home 6L) in the setting of not taking her home medications (sildenafil, lasix, nebs, inhalers) in preparation for PET scan, admitted 9/11. She was admitted to the ICU initially, and weaned back to 6-8L   with airway clearance and diuresis and transitioned to MPCU. Her acute on chronic hypoxemia and increase O2 requirement was likely multifactorial -- combination of volume overload as well as lack of airway clearance at home, however I think chronically her O2 requirement (related to her PH) is higher than the amount she has been on at home and she continues to have severe exertional hypoxemia probably mostly related to her parenchymal disease.      We have been struggling to strike a balance with diuresis for her with overall poor renal function (eGFR ~15) and pHTN. She seems fairly stably increased in her O2 needs to ~7-8L [facility].  Unfortunately, there is not much more to offer her in terms of additional PAH therapies.  We can adjust her Tyvaso, but this can only be done after discharge, as she must remain on iloprost until discharge.    We discussed today our concerns regarding her tenuous respiratory status and poor prognosis.  It is unfortunate that she is not a transplant candidate at [facility]; considered a heart-lung transplant, but she is likely not a candidate for this either in the setting of poor renal function (GFR <50 since at least August based on chemistry and suspect even lower based on cystatin c measurements from around this time).  With her multisystem organ dysfunction, we discussed that she may have a narrow window of opportunity to remain out of the hospital while her current oxygen needs can be supported at home.  Hospice would not be in line with her current GOC, as this would entail stopping her PAH therapies, which she is not ready for.  Her primary goal is to be at home/not die in the hospital.  We would like to support her in discharging in the coming days with is much medical optimization is possible.    RV Failure - PAH (Group 3, 5): RHC 9/20 this admission demonstrated severe pulmonary hypertension, markedly elevated PVR (9.3-10.5) and low CO/CI. Ultimately started on Tyvaso shortly thereafter for her PH-ILD diagnosis. She remains in the hospital for worsening AKI and challenges managing her volume status; creatinine initially improving with diuresis (now seems to have plateaued) and repeat echo last week shows relatively similar findings. Unfortunately she has limited options with high baseline O2 requirement and severe lung disease.  She is not a transplant candidate at [facility] due to prior PA plasty.  - please continue home AVAPS based on her chronic hypercarbic RF.  This should be continued at discharge.  - Defer diuresis to nephrology -happy to have found an oral diuretic regimen that works  - [employer] will take place when there is  a confirmed discharge date. Patient does not currently have delivery device.  Will work on coordinating in the coming days.  - Continue inhaled iloprost 5 mcg q4h -> Tyvaso at discharge.   - Continue sildenafil 40 mg TID  - Continue daily weight with strict I/Os  - cont monitoring pro-BNP, can be every other day  - please obtain daily standing weights      Pulmonary Sarcoidosis c/b Bronchiectasis  Soluble IL-2 receptor, Aspergillus Galactomannan Antigen, IgE checked 10/2022 all wnl. PET scan 9/17 does not appear to be active pulmonary sarcoidosis and again it is overall felt to be burned out from sarcoidosis standpoint. She is now stable satting at >95% on 8-10L via [facility]. It is likely she has higher O2 requirement chronically than the amount she has been using at home.   - O2 sat goal at 90-94%, wean oxygen as tolerated  - continue home pulmicort, brovana, PRN albuterol, azithromycin  - please cont airway clearance:              - duonebs TID and HTS (3%) BID with [place]. Please resume HTS (had fallen off MAR)     Chronic Pulmonary Aspergillosis   Aspergillus Galactomannan Antigen, IgE checked 10/2022 all wnl.   - continue home posaconazole     Karen Barber was seen, examined and discussed with  Dr. Judie Petit. [name]   Thank you for involving [place] in her care. We look forward to following with you.  Please don't hesitate to page [place] with any questions or concerns at [phone] (pulmonary consult fellow).    Kandis Fantasia, MD  Pulmonary Critical Care Fellow     SUBJECTIVE:     -Tolerating AVAPS overnight, first 30 to 40 minutes were difficult but better thereafter  -Saddened to discuss her prognosis today, feels she has lost faith in God    Medications:     Scheduled Meds:   acetaminophen  650 mg Oral Q6H    arformoterol  15 mcg Nebulization BID (RT)    azithromycin  250 mg Oral Daily    budesonide  0.5 mg Nebulization BID (RT)    diclofenac sodium  2 g Topical QID    enoxaparin (LOVENOX) injection  40 mg Subcutaneous Nightly    flu vacc [id number] 6mos up(PF)  0.5 mL Intramuscular During hospitalization    furosemide  120 mg Oral BID    gabapentin  100 mg Oral TID    iloprost  5 mcg Nebulization 6XD    insulin glargine  12 Units Subcutaneous Nightly    insulin lispro  0-20 Units Subcutaneous ACHS    insulin lispro  3 Units Subcutaneous 3xd Meals    ipratropium-albuterol  3 mL Nebulization Q12H (RT)    lidocaine  1 patch Transdermal Daily    [Provider Hold] magnesium oxide-Mg AA chelate 2 tablet Oral BID    posaconazole  200 mg Oral Daily    sildenafiL (pulm.hypertension)  40 mg Oral TID    sodium chloride  4 mL Nebulization BID     Continuous Infusions:  PRN Meds:.albuterol, aluminum-magnesium hydroxide-simethicone, calcium carbonate, dextrose in water, glucagon, melatonin, ondansetron **OR** ondansetron, polyethylene glycol, prochlorperazine, senna, sodium chloride     PHYSICAL EXAM:   BP 100/67  - Pulse 77  - Temp 36.6 ??C (97.9 ??F) (Oral)  - Resp 12  - Ht 180.3 cm (5' 11)  - Wt 86.6 kg (190 lb 14.7 oz)  - LMP  (LMP Unknown)  - SpO2 97%  -  BMI 26.63 kg/m??   General: NAD, alert sitting up in bed   HEENT: Mucous membranes moist, wearing large bore nasal cannula  Cardiovascular: trace peripheral edema  Pulmonary: comfortable respirations on [facility]  Musculoskeletal: Trace peripheral edema  Skin: no lesions noted on clothed exam  Neuro: No focal neurological deficits.    LABORATORY and RADIOLOGY DATA:     Pertinent Laboratory Data from the last 24 hours:  Lab Results   Component Value Date    WBC 2.3 (L) 01/04/2023    HGB 11.1 (L) 01/04/2023    HCT 33.7 (L) 01/04/2023    PLT 140 (L) 01/04/2023     Lab Results   Component Value Date    NA 138 01/04/2023    K 4.2 01/04/2023    CL 93 (L) 01/04/2023    CO2 37.0 (H) 01/04/2023    BUN 63 (H) 01/04/2023    CREATININE 2.37 (H) 01/04/2023    GLU 124 01/04/2023    CALCIUM 10.1 01/04/2023    MG 2.3 01/04/2023    PHOS 4.9 01/04/2023       Lab Results   Component Value Date    BILITOT 0.7 01/03/2023    BILIDIR 0.20 01/03/2023    PROT 7.4 01/03/2023    ALBUMIN 3.9 01/03/2023    ALT 25 01/03/2023    AST 22 01/03/2023    ALKPHOS 121 (H) 01/03/2023    GGT 60 (H) 05/31/2011       Lab Results   Component Value Date    INR 1.04 01/04/2023    APTT 37.2 01/04/2023       Pertinent Micro Data:  Microbiology Results (last day)       ** No results found for the last 24 hours. **          Pertinent Imaging Data from this admission:  10/10 Limited Echo  Summary    1. The left ventricle is relatively small in size with normal wall  thickness.    2. The left ventricular systolic function is normal, LVEF is visually  estimated at 55-60%.    3. The aortic valve is trileaflet with moderately thickened leaflets with  normal excursion.    4. The right ventricle is severely dilated in size, with moderately reduced  systolic function.    5. There is moderate tricuspid regurgitation.    6. There is severe pulmonary hypertension.    7. The right atrium is severely dilated in size.    8. IVC size and inspiratory change suggest elevated right atrial pressure.  (10-20 mmHg).

## 2023-01-05 NOTE — Unmapped (Signed)
Infectious Disease ([facility]) Progress Note    Assessment & Plan:   Karen Barber is a 57 y.o. female whose presentation is complicated by bronchiectasis type 1 in the setting of pulmonary sarcoidosis (6L [place] home O2), HTN, T2DM, COPD, s/p LUL lobectomy, and asthma that presented to [facility] admitted to MICU after becoming hypoxic after holding home meds prior to PET scan. After 1 week diuresing in ICU, patient stabilized and transferred to the MPCU.    Principal Problem:    Acute on chronic hypoxic respiratory failure (CMS-HCC)  Active Problems:    Pulmonary sarcoidosis (CMS-HCC)    Type 2 diabetes mellitus with hyperglycemia (CMS-HCC)    COPD (chronic obstructive pulmonary disease) (CMS-HCC)    Chronic pulmonary aspergillosis (CMS-HCC)    Pulmonary hypertension (CMS-HCC)    AKI (acute kidney injury) (CMS-HCC)    Active Problems  #Acute on Chronic Hypoxemic Respiratory Failure -   #Pulmonary Sarcoidosis -   #COPD/asthma -   #Bronchiectasis  Baseline oxygen 6L.  Recent admission with pneumonia growing Serratia marcescens. Patient held her morning medications prior to outpatient PET CT scan during which she became hypoxic to the 70s and admitted to MICU.  CXR consistent with extensive pulmonary fibrosis. She was weaned from high flow nasal cannula to large bore nasal cannula after IV diuresis and antibiotics. PET scan 9/18 is not indicative of active pulmonary sarcoidosis. RHC 9/20 showing significant pulm HTN disease with mean PA pressure of 47, for which pulmonology team started Iloprost. Notably, wedge pressure on that study was 5, which is reassuring against volume as driver of her respiratory status at that time. Patient has been having variations in oxygen requirement.  Received IV diuresis followed by transition to oral with 120 mg Lasix BID.  -- Continue to wean O2 with goal saturations of 90-94% per pulmonology, currently requiring 7-8L.  -- Continue home airway clearance (DuoNebs and HTS 3 times daily with [place])  -- Continue home Pulmicort, Brovana, Atrovent, as needed albuterol, and azithromycin  -- PT/OT: 5xL  -- Standing weights daily  -- Nephrology signed off. Recommend discharging on 120 mg PO Lasix BID and they will set up outpatient follow-up  -- Will benefit from ongoing GOC conversations. Palliative care previously consulted however may benefit from re-consult.  -- Evaluate if patient able to perform ADLs on [facility]  -- Plan to get oxymizer for her while admitted. Will need oxymizer at home    #Pulmonary hypertension - RV failure - Orthostatic hypotension  History of pulmonary hypertension secondary to pulmonary sarcoidosis/COPD.  TTE this admission with severely dilated right atrium with severe pulmonary hypertension PASP 86 mmHg.  Estimated right atrial pressure 5 to 10 mmHg.  On Lasix 80 mg twice daily at home.  Echo showed LVEF > 55% and severe pulmonary hypertension without significant change from prior.  Right heart cath showed severe pulmonary hypertension, markedly elevated PVR (9.3 - 10.5) and low cardiac output / index. HRCT scan shows unchanged fibrotic pulmonary sarcoidosis with bilateral segmental and subsegmental fibrosis post left upper lobectomy with unchanged apical right upper lobe and superior segment left lower lobe bullae.   -- Continue inhaled iloprost 5 mcg 6 times a day q4h; please do not mix with other nebulizer solutions   -- Plan to transition to Tyvaso nebs, working with pharmacy and [facility] nurse coordinator  -- [employer] rep to do teaching prior to DC - likely on Monday  -- Palliative care consulted for ongoing GOCs discussion given end-stage PAH.    #Acute  kidney injury on CKD3  Creatinine 1.3 on admission which seems to be baseline. Hospital course with rise of creatinine up to 3.   -- Creatinine 2.41 today  -- Renally dose medications  -- Daily BMP    #Secondary Hyperparathyroidism, Hypocalcemia, and Hyperphosphatemia  Secondary hyperparathyroidism due to CKD.  -- Reconsider need for future oral calcium carbonate 1600 mg TID  -- Reconsider need for future phoslo 667 mg PO TID   -- Reassess PTH levels in 4-6 weeks. Last checked 9/22.    #Type 2 diabetes mellitus with hyperglycemia and hypoglycemia  Hemoglobin A1c 8.3%.  Home regimen includes glargine 18 units nightly and sliding scale insulin.  Large fluctuations in glucose greater than 300 and occasional values ~50.  -- Sliding scale insulin insulin sensitivity factor 40  --12 glargine nightly  -- 3 units lispro tid      Chronic Problems     #Chronic Pain   - Gabapentin resumed at reduced dose     #Chronic pulmonary aspergillosis  On chronic posaconazole. Soluble IL-2 receptor, Aspergillus Galactomannan Antigen, IgE checked 10/2022 all wnl  -Continue home posaconazole Ppx  -Outpatient evaluation for [facility] scheduled for 10/25 to evaluate recurrent hemoptysis     #Iron deficiency anemia  #Chronic normocytic anemia (baseline hemoglobin 11)  --Iron supplementation with iron dextran 1,000mg  in 250cc on 9/23  --Iron level improving but still low   --Considering another supplementation with dextran    #Hyperlipidemia  LDL of 146  -- Follow-up outpatient    Issues Impacting Complexity of Management:  -The patient is at high risk for the development of complications of volume overload due to the need to provide IV hydration for suspected hypovolemia in the setting of: Pulmonary hypertension  -Need for the following intensive monitoring parameter(s) due to high risk of clinical decline: continuous oxygen monitoring, telemetry, and frequent monitoring of urine output to assess for the efficacy of diuretic regimen  -Need for intensive oxygen therapy of HFNC, which places the patient at high risk for oxygen toxicity  -The patient is at high risk of complications from pulmonary hypertension    Daily Checklist:  Diet: Regular Diet   DVT PPx: Lovenox 40mg  q24h  Electrolytes: Replete Potassium to >/= 3.6 and Magnesium to >/= 1.8  Code Status: DNR and DNI  Dispo:  Continue MPCU    Team Contact Information:   Primary Team: Infectious Disease (MED[facility])  Primary Resident: Sindy Guadeloupe, DO  Resident's Pager: [phone] (Infect Disease Intern - Tower)    Interval History:     No acute events overnight.     Patient sitting up in bed this morning and endorses feeling like she needs to cough to clear her lungs but not being able to cough anything up. She also endorses urinating often throughout the night. UOP yesterday was 2.2 L. She had long discussion with pulmonology yesterday regarding prognosis given few therapies for her PAH remain. She states she would like to be able to return home as opposed to SNF as she feels she would do better at home. Discussed that our plan is also to get her home and we are working on getting her Tyvaso and teaching set up so that she can discharge.    ROS negative except for where otherwise noted above.    Objective:   Temp:  [36.3 ??C (97.4 ??F)-36.7 ??C (98.1 ??F)] 36.7 ??C (98.1 ??F)  Heart Rate:  [76-83] 79  SpO2 Pulse:  [76-81] 78  Resp:  [12-34] 25  BP: (  83-127)/(50-97) 101/79  FiO2 (%):  [36 %-60 %] 36 %  SpO2:  [87 %-99 %] 94 %,   Intake/Output Summary (Last 24 hours) at 01/05/2023 1110  Last data filed at 01/05/2023 0800  Gross per 24 hour   Intake 520 ml   Output 1725 ml   Net -1205 ml   , S RR:  [12] 12  FiO2 (%):  [36 %-60 %] 36 %  O2 Device: Large bore nasal cannula (cool high flow)  O2 Flow Rate (L/min):  [8 L/min] 8 L/min    Gen: NAD, converses well in full sentences  HENT: Atraumatic, normocephalic  Heart: RRR,   Lungs: Normal work of breathing on 8L LBNC. Scattered wheezes and rhonchi R > L  Abdomen: Soft, nontender nondistended  Extremities: No lower extremity edema

## 2023-01-05 NOTE — Unmapped (Signed)
Pulmonary Consult Service  Follow-Up Note      Primary Service:   Medicine - Med K  Primary Service Attending:  Hulen Skains, MD  Reason for Consult:   Pulmonary hypertension    ASSESSMENT and PLAN     Karen Barber is a 57 y.o. 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 Pt was found to have acute on chronic respiratory failure (sat in 70s on home 6L) in the setting of not taking her home medications (sildenafil, lasix, nebs, inhalers) in preparation for PET scan, admitted 9/11. She was admitted to the ICU initially, and weaned back to 6-8L [place]  with airway clearance and diuresis and transitioned to MPCU. Her acute on chronic hypoxemia and increase O2 requirement was likely multifactorial -- combination of volume overload as well as lack of airway clearance at home, however I think chronically her O2 requirement (related to her PH) is higher than the amount she has been on at home and she continues to have severe exertional hypoxemia probably mostly related to her parenchymal disease.      We have been struggling to strike a balance with diuresis for her with overall poor renal function (eGFR ~15) and pHTN. She seems fairly stably increased in her O2 needs to ~7-8L [facility].  Unfortunately, there is not much more to offer her in terms of additional PAH therapies.  We can adjust her Tyvaso, but this can only be done after discharge, as she must remain on iloprost until discharge.     We discussed 10/17 our concerns regarding her tenuous respiratory status and poor prognosis.  It is unfortunate that she is not a transplant candidate at [facility]; considered a heart-lung transplant, but she is likely not a candidate for this either in the setting of poor renal function (GFR <50 since at least August based on chemistry and suspect even lower based on cystatin c measurements from around this time).  With her multisystem organ dysfunction, we discussed that she may have a narrow window of opportunity to remain out of the hospital while her current oxygen needs can be supported at home.  Hospice would not be in line with her current GOC, as this would entail stopping her PAH therapies, which she is not ready for.  Her primary goal is to be at home/not die in the hospital.  She also expressed desire to make it to her wedding in May. She is open to home palliative care services for symptom management, however, and is hopeful to have support from PT, OT, nursing, after she leaves the hospital. We did also discuss her upcoming BAE; she fortunately has not had hemoptysis this admission and worry that contrast administration may worsen her renal function - she is in agreement. If she has life threatening hemoptysis then would pursue, obviously.     RV Failure - PAH (Group 3, 5): RHC 9/20 this admission demonstrated severe pulmonary hypertension, markedly elevated PVR (9.3-10.5) and low CO/CI. Ultimately started on Tyvaso shortly thereafter for her PH-ILD diagnosis. She remains in the hospital for worsening AKI and challenges managing her volume status; creatinine initially improving with diuresis (now seems to have plateaued) and repeat echo last week shows relatively similar findings. Unfortunately she has limited options with high baseline O2 requirement and severe lung  disease.  She is not a transplant candidate at [facility] due to prior PA plasty.  - please continue home AVAPS based on her chronic hypercarbic RF.  This should be continued at discharge.  - Defer diuresis to nephrology - currently on oral Lasix twice daily, tolerating well with stable renal function  - Tyvaso teaching will take place when there is a confirmed discharge date - anticipating Monday.  - Continue inhaled iloprost 5 mcg q4h -> Tyvaso at discharge.   - Continue sildenafil 40 mg TID; cannot increase with current BP  - Continue daily standing weight with strict I/Os  - pro-BNP every 2-3 days  - please arrange home palliative care services  - we will try to arrange RHC in the outpatient setting on tyvaso      Pulmonary Sarcoidosis c/b Bronchiectasis  Soluble IL-2 receptor, Aspergillus Galactomannan Antigen, IgE checked 10/2022 all wnl. PET scan 9/17 does not appear to be active pulmonary sarcoidosis and again it is overall felt to be burned out from sarcoidosis standpoint. She is now stable satting at >95% on 8-10L via [facility]. It is likely she has higher O2 requirement chronically than the amount she has been using at home.   - O2 sat goal at 90-94%, wean oxygen as tolerated  - continue home pulmicort, brovana, PRN albuterol, azithromycin  - please cont airway clearance:              - duonebs TID and HTS (3%) BID with [place]  - please provide COVID 19 vaccine      Chronic Pulmonary Aspergillosis   Aspergillus Galactomannan Antigen, IgE checked 10/2022 all wnl.   - continue home posaconazole   - would cancel BAE next week    Ms. Glatt was seen, examined and discussed with  Dr. Judie Petit. [name]   Thank you for involving [place] in her care. We look forward to following with you.  Please don't hesitate to page [place] with any questions or concerns at [phone] (pulmonary consult fellow).    Kandis Fantasia, MD  Pulmonary & Critical Care Fellow      SUBJECTIVE:     On 8 L large bore this morning, vital signs stable, briefly on AVAPS but took it off/did not like wearing it. Feeling more heartened today after praying.     Medications:     Scheduled Meds:   acetaminophen  650 mg Oral Q6H    arformoterol  15 mcg Nebulization BID (RT)    azithromycin  250 mg Oral Daily    budesonide  0.5 mg Nebulization BID (RT)    diclofenac sodium  2 g Topical QID    enoxaparin (LOVENOX) injection  40 mg Subcutaneous Nightly    flu vacc [id number] 6mos up(PF)  0.5 mL Intramuscular During hospitalization    furosemide  120 mg Oral BID    gabapentin  100 mg Oral TID    iloprost  5 mcg Nebulization 6XD    insulin glargine  12 Units Subcutaneous Nightly    insulin lispro  0-20 Units Subcutaneous ACHS    insulin lispro  3 Units Subcutaneous 3xd Meals    ipratropium-albuterol  3 mL Nebulization Q12H (RT)    lidocaine  1 patch Transdermal Daily    [Provider Hold] magnesium oxide-Mg AA chelate  2 tablet Oral BID    posaconazole  200 mg Oral Daily    sildenafiL (pulm.hypertension)  40 mg Oral TID    sodium chloride  4 mL Nebulization BID  Continuous Infusions:  PRN Meds:.albuterol, aluminum-magnesium hydroxide-simethicone, calcium carbonate, dextrose in water, glucagon, melatonin, ondansetron **OR** ondansetron, polyethylene glycol, prochlorperazine, senna, sodium chloride     PHYSICAL EXAM:   BP 127/97  - Pulse 76  - Temp 36.4 ??C (97.6 ??F) (Oral)  - Resp 27  - Ht 180.3 cm (5' 11)  - Wt 86.6 kg (190 lb 14.7 oz)  - LMP  (LMP Unknown)  - SpO2 97%  - BMI 26.63 kg/m??   General: No acute distress   HEENT: Moist mucus membranes, no mucosal or conjunctival pallor  Respiratory: respirations even/unlabored on nasal cannula  Cardiovascular: Regular rate and rhythm  Extremities: Moves all extremities well, normal capillary refill  Integumentary: Intact,  Neurologic: Alert & Oriented  Psychiatric: Age appropriate, logical thought, full range of affect      LABORATORY and RADIOLOGY DATA:     Pertinent Laboratory Data from the last 24 hours:  Lab Results   Component Value Date    WBC 2.3 (L) 01/04/2023    HGB 11.1 (L) 01/04/2023    HCT 33.7 (L) 01/04/2023    PLT 140 (L) 01/04/2023     Lab Results   Component Value Date    NA 138 01/04/2023    K 4.2 01/04/2023    CL 93 (L) 01/04/2023    CO2 37.0 (H) 01/04/2023    BUN 63 (H) 01/04/2023    CREATININE 2.37 (H) 01/04/2023    GLU 124 01/04/2023    CALCIUM 10.1 01/04/2023    MG 2.3 01/04/2023    PHOS 4.9 01/04/2023       Lab Results   Component Value Date    BILITOT 0.7 01/03/2023    BILIDIR 0.20 01/03/2023    PROT 7.4 01/03/2023    ALBUMIN 3.9 01/03/2023    ALT 25 01/03/2023    AST 22 01/03/2023    ALKPHOS 121 (H) 01/03/2023    GGT 60 (H) 05/31/2011       Lab Results   Component Value Date    INR 1.04 01/04/2023    APTT 37.2 01/04/2023       Pertinent Micro Data:  Microbiology Results (last day)       ** No results found for the last 24 hours. **             Pertinent Imaging Data from the last 24 hours:  No new imaging in the last 24 hours

## 2023-01-06 LAB — BASIC METABOLIC PANEL
ANION GAP: 6 mmol/L (ref 5–14)
ANION GAP: 8 mmol/L (ref 5–14)
BLOOD UREA NITROGEN: 70 mg/dL — ABNORMAL HIGH (ref 9–23)
BLOOD UREA NITROGEN: 63 mg/dL — ABNORMAL HIGH (ref 9–23)
BUN / CREAT RATIO: 28
BUN / CREAT RATIO: 28
CALCIUM: 9.9 mg/dL (ref 8.7–10.4)
CALCIUM: 9.5 mg/dL (ref 8.7–10.4)
CHLORIDE: 93 mmol/L — ABNORMAL LOW (ref 98–107)
CHLORIDE: 92 mmol/L — ABNORMAL LOW (ref 98–107)
CO2: 39 mmol/L — ABNORMAL HIGH (ref 20.0–31.0)
CO2: 36 mmol/L — ABNORMAL HIGH (ref 20.0–31.0)
CREATININE: 2.46 mg/dL — ABNORMAL HIGH
CREATININE: 2.24 mg/dL — ABNORMAL HIGH
EGFR CKD-EPI (2021) FEMALE: 22 mL/min/{1.73_m2} — ABNORMAL LOW (ref >=60)
EGFR CKD-EPI (2021) FEMALE: 25 mL/min/{1.73_m2} — ABNORMAL LOW (ref >=60)
GLUCOSE RANDOM: 146 mg/dL (ref 70–179)
GLUCOSE RANDOM: 237 mg/dL — ABNORMAL HIGH (ref 70–179)
POTASSIUM: 3.8 mmol/L (ref 3.4–4.8)
POTASSIUM: 3.9 mmol/L (ref 3.4–4.8)
SODIUM: 138 mmol/L (ref 135–145)
SODIUM: 136 mmol/L (ref 135–145)

## 2023-01-06 LAB — MAGNESIUM
MAGNESIUM: 2.2 mg/dL (ref 1.6–2.6)
MAGNESIUM: 1.9 mg/dL (ref 1.6–2.6)

## 2023-01-06 LAB — CBC
HEMATOCRIT: 36 % (ref 34.0–44.0)
HEMOGLOBIN: 11.7 g/dL (ref 11.3–14.9)
MEAN CORPUSCULAR HEMOGLOBIN CONC: 32.5 g/dL (ref 32.0–36.0)
MEAN CORPUSCULAR HEMOGLOBIN: 27.5 pg (ref 25.9–32.4)
MEAN CORPUSCULAR VOLUME: 84.4 fL (ref 77.6–95.7)
MEAN PLATELET VOLUME: 8.5 fL (ref 6.8–10.7)
PLATELET COUNT: 176 10*9/L (ref 150–450)
RED BLOOD CELL COUNT: 4.27 10*12/L (ref 3.95–5.13)
RED CELL DISTRIBUTION WIDTH: 18.2 % — ABNORMAL HIGH (ref 12.2–15.2)
WBC ADJUSTED: 3.4 10*9/L — ABNORMAL LOW (ref 3.6–11.2)

## 2023-01-06 LAB — LACTATE, VENOUS, WHOLE BLOOD: LACTATE BLOOD VENOUS: 1.7 mmol/L (ref 0.5–1.8)

## 2023-01-06 LAB — PHOSPHORUS
PHOSPHORUS: 5.6 mg/dL — ABNORMAL HIGH (ref 2.4–5.1)
PHOSPHORUS: 4.3 mg/dL (ref 2.4–5.1)

## 2023-01-06 LAB — PRO-BNP: PRO-BNP: 7868 pg/mL — ABNORMAL HIGH (ref <=300.0)

## 2023-01-06 MED ADMIN — insulin lispro (HumaLOG) injection 3 Units: 3 [IU] | SUBCUTANEOUS | @ 22:00:00

## 2023-01-06 MED ADMIN — azithromycin (ZITHROMAX) tablet 250 mg: 250 mg | ORAL | @ 14:00:00 | Stop: 2025-08-25

## 2023-01-06 MED ADMIN — iloprost (VENTAVIS) nebulizer solution: 5 ug | RESPIRATORY_TRACT | @ 22:00:00

## 2023-01-06 MED ADMIN — budesonide (PULMICORT) nebulizer solution 0.5 mg: .5 mg | RESPIRATORY_TRACT | @ 12:00:00

## 2023-01-06 MED ADMIN — arformoterol (BROVANA) nebulizer solution 15 mcg/2 mL: 15 ug | RESPIRATORY_TRACT | @ 12:00:00

## 2023-01-06 MED ADMIN — insulin lispro (HumaLOG) injection 0-20 Units: 0-20 [IU] | SUBCUTANEOUS | @ 19:00:00

## 2023-01-06 MED ADMIN — budesonide (PULMICORT) nebulizer solution 0.5 mg: .5 mg | RESPIRATORY_TRACT | @ 01:00:00

## 2023-01-06 MED ADMIN — insulin lispro (HumaLOG) injection 3 Units: 3 [IU] | SUBCUTANEOUS | @ 14:00:00

## 2023-01-06 MED ADMIN — insulin lispro (HumaLOG) injection 0-20 Units: 0-20 [IU] | SUBCUTANEOUS | @ 01:00:00

## 2023-01-06 MED ADMIN — sildenafiL (pulm.hypertension) (REVATIO) tablet 40 mg: 40 mg | ORAL | @ 19:00:00

## 2023-01-06 MED ADMIN — insulin lispro (HumaLOG) injection 3 Units: 3 [IU] | SUBCUTANEOUS | @ 19:00:00

## 2023-01-06 MED ADMIN — iloprost (VENTAVIS) nebulizer solution: 5 ug | RESPIRATORY_TRACT | @ 01:00:00

## 2023-01-06 MED ADMIN — sodium chloride 3 % NEBULIZER solution 4 mL: 4 mL | RESPIRATORY_TRACT | @ 12:00:00

## 2023-01-06 MED ADMIN — gabapentin (NEURONTIN) capsule 100 mg: 100 mg | ORAL | @ 01:00:00

## 2023-01-06 MED ADMIN — enoxaparin (LOVENOX) syringe 40 mg: 40 mg | SUBCUTANEOUS | @ 01:00:00

## 2023-01-06 MED ADMIN — furosemide (LASIX) tablet 120 mg: 120 mg | ORAL | @ 19:00:00

## 2023-01-06 MED ADMIN — ipratropium-albuterol (DUO-NEB) 0.5-2.5 mg/3 mL nebulizer solution 3 mL: 3 mL | RESPIRATORY_TRACT | @ 12:00:00

## 2023-01-06 MED ADMIN — iloprost (VENTAVIS) nebulizer solution: 5 ug | RESPIRATORY_TRACT | @ 20:00:00

## 2023-01-06 MED ADMIN — insulin glargine (LANTUS) injection 12 Units: 12 [IU] | SUBCUTANEOUS | @ 01:00:00

## 2023-01-06 MED ADMIN — gabapentin (NEURONTIN) capsule 100 mg: 100 mg | ORAL | @ 19:00:00

## 2023-01-06 MED ADMIN — iloprost (VENTAVIS) nebulizer solution: 5 ug | RESPIRATORY_TRACT | @ 13:00:00

## 2023-01-06 MED ADMIN — iloprost (VENTAVIS) nebulizer solution: 5 ug | RESPIRATORY_TRACT | @ 04:00:00

## 2023-01-06 MED ADMIN — arformoterol (BROVANA) nebulizer solution 15 mcg/2 mL: 15 ug | RESPIRATORY_TRACT | @ 01:00:00

## 2023-01-06 MED ADMIN — sildenafiL (pulm.hypertension) (REVATIO) tablet 40 mg: 40 mg | ORAL | @ 14:00:00

## 2023-01-06 MED ADMIN — furosemide (LASIX) tablet 120 mg: 120 mg | ORAL | @ 09:00:00

## 2023-01-06 MED ADMIN — diclofenac sodium (VOLTAREN) 1 % gel 2 g: 2 g | TOPICAL | @ 19:00:00

## 2023-01-06 MED ADMIN — iloprost (VENTAVIS) nebulizer solution: 5 ug | RESPIRATORY_TRACT | @ 17:00:00

## 2023-01-06 MED ADMIN — ipratropium-albuterol (DUO-NEB) 0.5-2.5 mg/3 mL nebulizer solution 3 mL: 3 mL | RESPIRATORY_TRACT | @ 01:00:00

## 2023-01-06 MED ADMIN — sodium chloride 3 % NEBULIZER solution 4 mL: 4 mL | RESPIRATORY_TRACT | @ 01:00:00

## 2023-01-06 MED ADMIN — posaconazole (NOXAFIL) delayed released tablet 200 mg: 200 mg | ORAL | @ 14:00:00

## 2023-01-06 MED ADMIN — gabapentin (NEURONTIN) capsule 100 mg: 100 mg | ORAL | @ 14:00:00

## 2023-01-06 MED ADMIN — sildenafiL (pulm.hypertension) (REVATIO) tablet 40 mg: 40 mg | ORAL | @ 01:00:00

## 2023-01-06 MED ADMIN — oxyCODONE (ROXICODONE) immediate release tablet 2.5 mg: 2.5 mg | ORAL | @ 16:00:00 | Stop: 2023-01-06

## 2023-01-06 NOTE — Unmapped (Signed)
Patient was compliant with use of NPPV this shift, tolerating well. Inhaled medications given with no adverse reaction.  Uses large bore nasal cannula when CPAP is not in use.

## 2023-01-06 NOTE — Unmapped (Addendum)
Pt AO x4. SR 1AVB. HR 70-80s. SBP 70-100s. MD aware. O2 >90% on 8L LBNC/50% CPAP. UOP adequate. No BM. Safety precautions maintained. POC ongoing.    Problem: Adult Inpatient Plan of Care  Goal: Plan of Care Review  Outcome: Ongoing - Unchanged  Flowsheets (Taken 01/06/2023 0404)  Progress: no change  Plan of Care Reviewed With: patient  Goal: Patient-Specific Goal (Individualized)  Outcome: Ongoing - Unchanged  Goal: Absence of Hospital-Acquired Illness or Injury  Outcome: Ongoing - Unchanged  Intervention: Identify and Manage Fall Risk  Recent Flowsheet Documentation  Taken 01/05/2023 2000 by Earlie Raveling, RN  Safety Interventions:   aspiration precautions   bleeding precautions   environmental modification   fall reduction program maintained   infection management   low bed   lighting adjusted for tasks/safety  Intervention: Prevent Infection  Recent Flowsheet Documentation  Taken 01/05/2023 2000 by Earlie Raveling, RN  Infection Prevention:   cohorting utilized   hand hygiene promoted  Goal: Optimal Comfort and Wellbeing  Outcome: Ongoing - Unchanged  Goal: Readiness for Transition of Care  Outcome: Ongoing - Unchanged  Goal: Rounds/Family Conference  Outcome: Ongoing - Unchanged     Problem: Fall Injury Risk  Goal: Absence of Fall and Fall-Related Injury  Outcome: Ongoing - Unchanged  Intervention: Promote Injury-Free Environment  Recent Flowsheet Documentation  Taken 01/05/2023 2000 by Earlie Raveling, RN  Safety Interventions:   aspiration precautions   bleeding precautions   environmental modification   fall reduction program maintained   infection management   low bed   lighting adjusted for tasks/safety     Problem: Gas Exchange Impaired  Goal: Optimal Gas Exchange  Outcome: Ongoing - Unchanged  Intervention: Optimize Oxygenation and Ventilation  Recent Flowsheet Documentation  Taken 01/05/2023 2000 by Earlie Raveling, RN  Head of Bed [facility]) Positioning: HOB at 45 degrees     Problem: Comorbidity Management  Goal: Blood Glucose Levels Within Targeted Range  Outcome: Ongoing - Unchanged  Goal: Blood Pressure in Desired Range  Outcome: Ongoing - Unchanged     Problem: Skin Injury Risk Increased  Goal: Skin Health and Integrity  Outcome: Ongoing - Unchanged  Intervention: Optimize Skin Protection  Recent Flowsheet Documentation  Taken 01/05/2023 2000 by Earlie Raveling, RN  Head of Bed [facility]) Positioning: HOB at 45 degrees

## 2023-01-06 NOTE — Unmapped (Signed)
Infectious Disease ([facility]) Progress Note    Assessment & Plan:   Karen Barber is a 57 y.o. female whose presentation is complicated by bronchiectasis type 1 in the setting of pulmonary sarcoidosis (6L [place] home O2), HTN, T2DM, COPD, s/p LUL lobectomy, and asthma that presented to [facility] admitted to MICU after becoming hypoxic after holding home meds prior to PET scan. After 1 week diuresing in ICU, patient stabilized and transferred to the MPCU.    Principal Problem:    Acute on chronic hypoxic respiratory failure (CMS-HCC)  Active Problems:    Pulmonary sarcoidosis (CMS-HCC)    Type 2 diabetes mellitus with hyperglycemia (CMS-HCC)    COPD (chronic obstructive pulmonary disease) (CMS-HCC)    Chronic pulmonary aspergillosis (CMS-HCC)    Pulmonary hypertension (CMS-HCC)    AKI (acute kidney injury) (CMS-HCC)    Active Problems  #Acute on Chronic Hypoxemic Respiratory Failure -   #Pulmonary Sarcoidosis -   #COPD/asthma -   #Bronchiectasis  Baseline oxygen 6L. Severe PAH w/ RV failure and limited treatment options. Followed by Pulm. Stable O2 req today (~7L LBNC) with robust UOP on oral Lasix 120 mg BID.  Continues to have severe O2 desaturations with exertion. Thoughtful discussion between Med K, Pulmonology consultants, and patient on 10/18 surrounding overall prognosis and GOC. Patient desires making it to her wedding in May with an important goal to get out of the hospital.  She is open to home palliative care services for symptom management.  Planning for Tyvaso equipment and teaching on 10/21 and palliative care referral for home services and eventual transition to hospice on discharge.  -- Continue to wean O2 with goal saturations of 90-94% per pulmonology, currently requiring 7-8L.  -- Continue home airway clearance (DuoNebs and HTS 3 times daily with [place])  -- Continue home Pulmicort, Brovana, Atrovent, as needed albuterol, and azithromycin  -- PT/OT: 5xL  -- Standing weights daily  -- Nephrology signed off. Recommend discharging on 120 mg PO Lasix BID and they will set up outpatient follow-up  -- Plan for palliative care referral on discharge  -- Evaluate if patient able to perform ADLs on [facility]  -- Plan to get oxymizer for her while admitted. Will need oxymizer at home    #Pulmonary hypertension - RV failure - Orthostatic hypotension  History of pulmonary hypertension secondary to pulmonary sarcoidosis/COPD.  TTE this admission with severely dilated right atrium with severe pulmonary hypertension PASP 86 mmHg.  Estimated right atrial pressure 5 to 10 mmHg.  On Lasix 80 mg twice daily at home.  Echo showed LVEF > 55% and severe pulmonary hypertension without significant change from prior.  Right heart cath showed severe pulmonary hypertension, markedly elevated PVR (9.3 - 10.5) and low cardiac output / index. HRCT scan shows unchanged fibrotic pulmonary sarcoidosis with bilateral segmental and subsegmental fibrosis post left upper lobectomy with unchanged apical right upper lobe and superior segment left lower lobe bullae.   -- Continue inhaled iloprost 5 mcg 6 times a day q4h; please do not mix with other nebulizer solutions   -- Plan to transition to Tyvaso nebs, working with pharmacy and [facility] nurse coordinator  -- [facility] rep to do teaching prior to DC - likely on 10/21 v 10/22    #Acute kidney injury on CKD3  Stably elevated. Creatinine 1.3 on admission which seems to be baseline. Hospital course with rise of creatinine up to 3. Permissive hypercreatinemia given tenuous and narrow euvolemia window with respiratory status.   -- Creatinine 2.41  today  -- Renally dose medications  -- Daily BMP    #Secondary Hyperparathyroidism, Hypocalcemia, and Hyperphosphatemia  Secondary hyperparathyroidism due to CKD.  -- Reconsider need for future oral calcium carbonate 1600 mg TID  -- Reconsider need for future phoslo 667 mg PO TID   -- Reassess PTH levels in 4-6 weeks. Last checked 9/22.    #Type 2 diabetes mellitus with hyperglycemia and hypoglycemia  Hemoglobin A1c 8.3%.  Home regimen includes glargine 18 units nightly and sliding scale insulin.  Large fluctuations in glucose greater than 300 and occasional values ~50.  -- Sliding scale insulin insulin sensitivity factor 40  --12 glargine nightly  -- 3 units lispro tid      Chronic Problems     #Chronic Pain   - Gabapentin resumed at reduced dose     #Chronic pulmonary aspergillosis  On chronic posaconazole. Soluble IL-2 receptor, Aspergillus Galactomannan Antigen, IgE checked 10/2022 all wnl  -Continue home posaconazole Ppx  -Outpatient evaluation for BAE scheduled for 10/25 to evaluate recurrent hemoptysis     #Iron deficiency anemia  #Chronic normocytic anemia (baseline hemoglobin 11)  --Iron supplementation with iron dextran 1,000mg  in 250cc on 9/23  --Iron level improving but still low   --Considering another supplementation with dextran    #Hyperlipidemia  LDL of 146  -- Follow-up outpatient    Issues Impacting Complexity of Management:  -The patient is at high risk for the development of complications of volume overload due to the need to provide IV hydration for suspected hypovolemia in the setting of: Pulmonary hypertension  -Need for the following intensive monitoring parameter(s) due to high risk of clinical decline: continuous oxygen monitoring, telemetry, and frequent monitoring of urine output to assess for the efficacy of diuretic regimen  -Need for intensive oxygen therapy of HFNC, which places the patient at high risk for oxygen toxicity  -The patient is at high risk of complications from pulmonary hypertension    Daily Checklist:  Diet: Regular Diet   DVT PPx: Lovenox 40mg  q24h  Electrolytes: Replete Potassium to >/= 3.6 and Magnesium to >/= 1.8  Code Status: DNR and DNI  Dispo: Working towards discharge 10/21 to be 10/22.  Barriers include Tyvaso applicator and teaching    Team Contact Information:   Primary Team: Infectious Disease (MEDK)  Primary Resident: Cleotis Nipper, MD  Resident's Pager: [phone] Evelene Croon Disease Senior- [facility])    Interval History:     No acute events overnight.     Patient seen on bedside rounds this morning.  From a respiratory standpoint she feels stable from days prior but she is endorsing 9/10 lower back pain.  The pain is bilateral and involves the paraspinal muscles.  She endorses chronic low back pain and attributes the pain to her sleeping position.  She is aware that we are working on [employer] supplies and teaching.  We are hoping for discharge home early this week.  She is on board with [place] referring her for palliative care services when she is home.    ROS negative except for where otherwise noted above.    Objective:   Temp:  [36 ??C (96.8 ??F)-36.7 ??C (98.1 ??F)] 36.4 ??C (97.6 ??F)  Heart Rate:  [79-85] 82  SpO2 Pulse:  [78-85] 82  Resp:  [20-33] 24  BP: (83-100)/(60-74) 93/61  FiO2 (%):  [50 %] 50 %  SpO2:  [91 %-99 %] 91 %,   Intake/Output Summary (Last 24 hours) at 01/06/2023 1207  Last data filed at 01/06/2023  0530  Gross per 24 hour   Intake 200 ml   Output 1550 ml   Net -1350 ml   , FiO2 (%):  [50 %] 50 %  O2 Device: Large bore nasal cannula (cool high flow)  O2 Flow Rate (L/min):  [8 L/min] 8 L/min    Gen: NAD, converses well in full sentences  HENT: Atraumatic, normocephalic  Heart: RRR,   Lungs: Normal work of breathing on 8L LBNC. Scattered wheezes and rhonchi R > L  Abdomen: Soft, nontender nondistended  Extremities: No lower extremity edema

## 2023-01-07 LAB — BASIC METABOLIC PANEL
ANION GAP: 8 mmol/L (ref 5–14)
ANION GAP: 7 mmol/L (ref 5–14)
BLOOD UREA NITROGEN: 60 mg/dL — ABNORMAL HIGH (ref 9–23)
BLOOD UREA NITROGEN: 63 mg/dL — ABNORMAL HIGH (ref 9–23)
BUN / CREAT RATIO: 26
BUN / CREAT RATIO: 28
CALCIUM: 9.7 mg/dL (ref 8.7–10.4)
CALCIUM: 9.8 mg/dL (ref 8.7–10.4)
CHLORIDE: 93 mmol/L — ABNORMAL LOW (ref 98–107)
CHLORIDE: 96 mmol/L — ABNORMAL LOW (ref 98–107)
CO2: 38 mmol/L — ABNORMAL HIGH (ref 20.0–31.0)
CO2: 36 mmol/L — ABNORMAL HIGH (ref 20.0–31.0)
CREATININE: 2.28 mg/dL — ABNORMAL HIGH
CREATININE: 2.25 mg/dL — ABNORMAL HIGH
EGFR CKD-EPI (2021) FEMALE: 24 mL/min/{1.73_m2} — ABNORMAL LOW (ref >=60)
EGFR CKD-EPI (2021) FEMALE: 25 mL/min/{1.73_m2} — ABNORMAL LOW (ref >=60)
GLUCOSE RANDOM: 96 mg/dL (ref 70–179)
GLUCOSE RANDOM: 121 mg/dL (ref 70–179)
POTASSIUM: 4 mmol/L (ref 3.4–4.8)
POTASSIUM: 4 mmol/L (ref 3.4–4.8)
SODIUM: 139 mmol/L (ref 135–145)
SODIUM: 139 mmol/L (ref 135–145)

## 2023-01-07 LAB — PHOSPHORUS
PHOSPHORUS: 5.3 mg/dL — ABNORMAL HIGH (ref 2.4–5.1)
PHOSPHORUS: 5.6 mg/dL — ABNORMAL HIGH (ref 2.4–5.1)

## 2023-01-07 LAB — LACTATE, VENOUS, WHOLE BLOOD: LACTATE BLOOD VENOUS: 1.2 mmol/L (ref 0.5–1.8)

## 2023-01-07 LAB — CBC
HEMATOCRIT: 37.3 % (ref 34.0–44.0)
HEMOGLOBIN: 12.1 g/dL (ref 11.3–14.9)
MEAN CORPUSCULAR HEMOGLOBIN CONC: 32.4 g/dL (ref 32.0–36.0)
MEAN CORPUSCULAR HEMOGLOBIN: 27.3 pg (ref 25.9–32.4)
MEAN CORPUSCULAR VOLUME: 84.3 fL (ref 77.6–95.7)
MEAN PLATELET VOLUME: 8 fL (ref 6.8–10.7)
PLATELET COUNT: 154 10*9/L (ref 150–450)
RED BLOOD CELL COUNT: 4.43 10*12/L (ref 3.95–5.13)
RED CELL DISTRIBUTION WIDTH: 17.7 % — ABNORMAL HIGH (ref 12.2–15.2)
WBC ADJUSTED: 3.4 10*9/L — ABNORMAL LOW (ref 3.6–11.2)

## 2023-01-07 LAB — MAGNESIUM
MAGNESIUM: 2.1 mg/dL (ref 1.6–2.6)
MAGNESIUM: 2.2 mg/dL (ref 1.6–2.6)

## 2023-01-07 MED ADMIN — enoxaparin (LOVENOX) syringe 40 mg: 40 mg | SUBCUTANEOUS | @ 01:00:00

## 2023-01-07 MED ADMIN — gabapentin (NEURONTIN) capsule 100 mg: 100 mg | ORAL | @ 18:00:00

## 2023-01-07 MED ADMIN — budesonide (PULMICORT) nebulizer solution 0.5 mg: .5 mg | RESPIRATORY_TRACT

## 2023-01-07 MED ADMIN — gabapentin (NEURONTIN) capsule 100 mg: 100 mg | ORAL | @ 13:00:00

## 2023-01-07 MED ADMIN — insulin lispro (HumaLOG) injection 3 Units: 3 [IU] | SUBCUTANEOUS | @ 22:00:00

## 2023-01-07 MED ADMIN — arformoterol (BROVANA) nebulizer solution 15 mcg/2 mL: 15 ug | RESPIRATORY_TRACT

## 2023-01-07 MED ADMIN — sildenafiL (pulm.hypertension) (REVATIO) tablet 40 mg: 40 mg | ORAL | @ 01:00:00

## 2023-01-07 MED ADMIN — insulin lispro (HumaLOG) injection 3 Units: 3 [IU] | SUBCUTANEOUS | @ 15:00:00

## 2023-01-07 MED ADMIN — sodium chloride 3 % NEBULIZER solution 4 mL: 4 mL | RESPIRATORY_TRACT | @ 12:00:00

## 2023-01-07 MED ADMIN — arformoterol (BROVANA) nebulizer solution 15 mcg/2 mL: 15 ug | RESPIRATORY_TRACT | @ 12:00:00

## 2023-01-07 MED ADMIN — iloprost (VENTAVIS) nebulizer solution: 5 ug | RESPIRATORY_TRACT | @ 15:00:00

## 2023-01-07 MED ADMIN — ipratropium-albuterol (DUO-NEB) 0.5-2.5 mg/3 mL nebulizer solution 3 mL: 3 mL | RESPIRATORY_TRACT

## 2023-01-07 MED ADMIN — iloprost (VENTAVIS) nebulizer solution: 5 ug | RESPIRATORY_TRACT | @ 01:00:00

## 2023-01-07 MED ADMIN — ipratropium-albuterol (DUO-NEB) 0.5-2.5 mg/3 mL nebulizer solution 3 mL: 3 mL | RESPIRATORY_TRACT | @ 12:00:00

## 2023-01-07 MED ADMIN — iloprost (VENTAVIS) nebulizer solution: 5 ug | RESPIRATORY_TRACT | @ 04:00:00

## 2023-01-07 MED ADMIN — iloprost (VENTAVIS) nebulizer solution: 5 ug | RESPIRATORY_TRACT | @ 21:00:00

## 2023-01-07 MED ADMIN — sildenafiL (pulm.hypertension) (REVATIO) tablet 40 mg: 40 mg | ORAL | @ 18:00:00

## 2023-01-07 MED ADMIN — furosemide (LASIX) tablet 120 mg: 120 mg | ORAL | @ 10:00:00

## 2023-01-07 MED ADMIN — gabapentin (NEURONTIN) capsule 100 mg: 100 mg | ORAL | @ 01:00:00

## 2023-01-07 MED ADMIN — iloprost (VENTAVIS) nebulizer solution: 5 ug | RESPIRATORY_TRACT | @ 12:00:00

## 2023-01-07 MED ADMIN — sodium chloride 3 % NEBULIZER solution 4 mL: 4 mL | RESPIRATORY_TRACT

## 2023-01-07 MED ADMIN — sildenafiL (pulm.hypertension) (REVATIO) tablet 40 mg: 40 mg | ORAL | @ 13:00:00

## 2023-01-07 MED ADMIN — insulin lispro (HumaLOG) injection 0-20 Units: 0-20 [IU] | SUBCUTANEOUS | @ 01:00:00

## 2023-01-07 MED ADMIN — posaconazole (NOXAFIL) delayed released tablet 200 mg: 200 mg | ORAL | @ 13:00:00

## 2023-01-07 MED ADMIN — acetaminophen (TYLENOL) tablet 650 mg: 650 mg | ORAL | @ 13:00:00

## 2023-01-07 MED ADMIN — budesonide (PULMICORT) nebulizer solution 0.5 mg: .5 mg | RESPIRATORY_TRACT | @ 12:00:00

## 2023-01-07 MED ADMIN — azithromycin (ZITHROMAX) tablet 250 mg: 250 mg | ORAL | @ 13:00:00 | Stop: 2025-08-25

## 2023-01-07 MED ADMIN — insulin glargine (LANTUS) injection 12 Units: 12 [IU] | SUBCUTANEOUS | @ 01:00:00

## 2023-01-07 MED ADMIN — iloprost (VENTAVIS) nebulizer solution: 5 ug | RESPIRATORY_TRACT | @ 18:00:00

## 2023-01-07 MED ADMIN — furosemide (LASIX) tablet 120 mg: 120 mg | ORAL | @ 18:00:00

## 2023-01-07 NOTE — Unmapped (Signed)
1900-0700:    Pt A&O x4. NSR with 1st deg AVB. HR 70-80s. SBP soft in 80-90s. MD aware. O2 >90% on 8L LBNC/50% BiPAP. UOP adequate. No BM during the shift. Blood sugar monitor as order and insulin as order. Safety precautions maintained. POC ongoing. Plan for the oxymizer for the home and tyvaso nebs treatment teaching at the bedside next week.       Current Hospital Length of Stay: 39 Days    INFUSIONS:      VITALS:  Vitals:    01/03/23 0532 01/04/23 1015   Weight: 86.8 kg (191 lb 6.4 oz) 86.6 kg (190 lb 14.7 oz)      Weight change:      Temp:  [36 ??C (96.8 ??F)-36.7 ??C (98.1 ??F)] 36 ??C (96.8 ??F)  Heart Rate:  [77-90] 79  SpO2 Pulse:  [81-94] 94  Resp:  [18-31] 26  BP: (86-95)/(57-67) 86/61  MAP (mmHg):  [66-74] 68  FiO2 (%):  [50 %] 50 %  SpO2:  [84 %-99 %] 96 %     VENT/OXYGEN SETTINGS:  FiO2 (%): 50 %    INS & OUTS:  Date 01/06/23 0701 - 01/07/23 0700 01/07/23 0701 - 01/08/23 0700   Shift 0701-1900 1901-0700 24 Hour Total 0701-1900 1901-0700 24 Hour Total   INTAKE   P.O. 400 350 750      Shift Total 400 350 750      OUTPUT   Urine [phone]      Shift Total [phone]        Intake/Output Summary (Last 24 hours) at 01/07/2023 0450  Last data filed at 01/07/2023 0404  Gross per 24 hour   Intake 750 ml   Output 1750 ml   Net -1000 ml        ACCESS:  Patient Lines/Drains/Airways Status       Active Active Lines, Drains, & Airways       Name Placement date Placement time Site Days    External Urinary Device 12/02/22 With Suction 12/02/22  1900  -- 35    Peripheral IV 01/01/23 Posterior;Right Forearm 01/01/23  0543  Forearm  5                     Problem: Adult Inpatient Plan of Care  Goal: Plan of Care Review  Outcome: Ongoing - Unchanged  Goal: Patient-Specific Goal (Individualized)  Outcome: Ongoing - Unchanged  Goal: Absence of Hospital-Acquired Illness or Injury  Outcome: Ongoing - Unchanged  Intervention: Identify and Manage Fall Risk  Recent Flowsheet Documentation  Taken 01/06/2023 2000 by Lilyan Gilford, RN  Safety Interventions:   aspiration precautions   commode/urinal/bedpan at bedside   lighting adjusted for tasks/safety   low bed   nonskid shoes/slippers when out of bed   bleeding precautions   room near unit station  Intervention: Prevent and Manage VTE (Venous Thromboembolism) Risk  Recent Flowsheet Documentation  Taken 01/06/2023 1950 by Lilyan Gilford, RN  VTE Prevention/Management:   fluids promoted   ambulation promoted   anticoagulant therapy  Intervention: Prevent Infection  Recent Flowsheet Documentation  Taken 01/06/2023 2000 by Lilyan Gilford, RN  Infection Prevention:   cohorting utilized   equipment surfaces disinfected   hand hygiene promoted   personal protective equipment utilized   rest/sleep promoted   single patient room provided  Goal: Optimal Comfort and Wellbeing  Outcome: Ongoing - Unchanged  Goal: Readiness for Transition of Care  Outcome: Ongoing - Unchanged  Goal:  Rounds/Family Conference  Outcome: Ongoing - Unchanged     Problem: Fall Injury Risk  Goal: Absence of Fall and Fall-Related Injury  Outcome: Ongoing - Unchanged  Intervention: Promote Injury-Free Environment  Recent Flowsheet Documentation  Taken 01/06/2023 2000 by Lilyan Gilford, RN  Safety Interventions:   aspiration precautions   commode/urinal/bedpan at bedside   lighting adjusted for tasks/safety   low bed   nonskid shoes/slippers when out of bed   bleeding precautions   room near unit station     Problem: Gas Exchange Impaired  Goal: Optimal Gas Exchange  Outcome: Ongoing - Unchanged  Intervention: Optimize Oxygenation and Ventilation  Recent Flowsheet Documentation  Taken 01/07/2023 0404 by Lilyan Gilford, RN  Head of Bed [facility]) Positioning: HOB at 90 degrees  Taken 01/06/2023 2000 by Lilyan Gilford, RN  Head of Bed [facility]) Positioning: HOB at 30-45 degrees  Taken 01/06/2023 1950 by Lilyan Gilford, RN  Head of Bed [facility]) Positioning: HOB at 90 degrees     Problem: Comorbidity Management  Goal: Blood Glucose Levels Within Targeted Range  Outcome: Ongoing - Unchanged  Intervention: Monitor and Manage Glycemia  Recent Flowsheet Documentation  Taken 01/06/2023 2000 by Lilyan Gilford, RN  Glycemic Management: blood glucose monitored  Goal: Blood Pressure in Desired Range  Outcome: Ongoing - Unchanged     Problem: Skin Injury Risk Increased  Goal: Skin Health and Integrity  Outcome: Ongoing - Unchanged  Intervention: Optimize Skin Protection  Recent Flowsheet Documentation  Taken 01/07/2023 0404 by Lilyan Gilford, RN  Head of Bed [facility]) Positioning: HOB at 90 degrees  Taken 01/06/2023 2000 by Lilyan Gilford, RN  Activity Management: back to bed  Pressure Reduction Techniques:   frequent weight shift encouraged   heels elevated off bed  Head of Bed [facility]) Positioning: HOB at 30-45 degrees  Taken 01/06/2023 1950 by Lilyan Gilford, RN  Pressure Reduction Techniques:   frequent weight shift encouraged   heels elevated off bed  Head of Bed (HOB) Positioning: HOB at 90 degrees  Pressure Reduction Devices: pressure-redistributing mattress utilized           See flowsheets/MAR for further information      Lilyan Gilford, RN  January 07, 2023 4:50 AM

## 2023-01-07 NOTE — Unmapped (Signed)
Patient is compliant with using CPAP HS, large bore nasal cannula during the day when CPAP isn't in use. Inhaled medication given with no adverse reaction.

## 2023-01-07 NOTE — Unmapped (Signed)
Pulmonary Consult Service  Follow-Up Note      Primary Service:   Medicine - Med K  Primary Service Attending:  Hulen Skains, MD  Reason for Consult:   Pulmonary hypertension    ASSESSMENT and PLAN     Karen Barber is a 57 y.o. 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 Pt was found to have acute on chronic respiratory failure (sat in 70s on home 6L) in the setting of not taking her home medications (sildenafil, lasix, nebs, inhalers) in preparation for PET scan, admitted 9/11. She was admitted to the ICU initially, and weaned back to 6-8L [place]  with airway clearance and diuresis and transitioned to [facility]PCU. Her acute on chronic hypoxemia and increase O2 requirement was likely multifactorial -- combination of volume overload as well as lack of airway clearance at home, however I think chronically her O2 requirement (related to her PH) is higher than the amount she has been on at home and she continues to have severe exertional hypoxemia probably mostly related to her parenchymal disease.      We have been struggling to strike a balance with diuresis for her with overall poor renal function (eGFR ~15) and pHTN. She seems fairly stably increased in her O2 needs to ~7-8L [facility].  Unfortunately, there is not much more to offer her in terms of additional PAH therapies.  We can adjust her Tyvaso, but this can only be done after discharge, as she must remain on iloprost until discharge.     We had several conversations over the last several days regarding her tenuous respiratory status and poor prognosis.  She is not a transplant candidate at [facility] due to prior penoplasty until cannot actually sleep in her bed again; considered a heart-lung transplant, but she is likely not a candidate for this either in the setting of poor renal function (GFR <50 since at least August based on chemistry and suspect even lower based on cystatin c measurements from around this time).  With her multisystem organ dysfunction, we discussed that she may have a narrow window of opportunity to remain out of the hospital while her current oxygen needs can be supported at home.  Hospice would not be in line with her current GOC, as this would entail stopping her PAH therapies, which she is not ready for.      Her primary goal is to be at home/not die in the hospital.  She also expressed desire to make it to her wedding in May. She is open to home palliative care services for symptom management; appreciate primary team coordinating. We did also discuss her upcoming BAE; she fortunately has not had hemoptysis this admission and worry that contrast administration may worsen her renal function - she is in agreement with canceling the procedure. If she has life threatening hemoptysis then would pursue.     RV Failure - PAH (Group 3, 5): RHC 9/20 this admission demonstrated severe pulmonary hypertension, markedly elevated PVR (9.3-10.5) and low CO/CI. Ultimately started on Tyvaso shortly thereafter for her PH-ILD diagnosis. She remains in the hospital for AKI and challenges managing her volume status; creatinine initially improving with diuresis (now seems to have plateaued) and repeat echo last week shows relatively similar findings. Unfortunately she has limited options with high  baseline O2 requirement and severe lung disease.    - please continue home AVAPS based on her chronic hypercarbic RF.  This should be continued at discharge -adjusted her settings today to be more comfortable   - Have pended an updated Rx to be signed at discharge and faxed to [facility] by CM  - Defer diuresis to nephrology - currently on oral Lasix twice daily, tolerating well with stable renal function  - Tyvaso teaching will take place when there is a confirmed discharge date - anticipating Monday.  - Continue inhaled iloprost 5 mcg q4h -> Tyvaso at discharge.   - Continue sildenafil 40 mg TID; cannot increase with current BP  - Continue daily standing weight with strict I/Os  - pro-BNP every 2-3 days  - please arrange home palliative care services  - we will try to arrange RHC in the outpatient setting on tyvaso      Pulmonary Sarcoidosis c/b Bronchiectasis  Soluble IL-2 receptor, Aspergillus Galactomannan Antigen, IgE checked 10/2022 all wnl. PET scan 9/17 does not appear to be active pulmonary sarcoidosis and again it is overall felt to be burned out from sarcoidosis standpoint. She is now stable satting at >95% on 8-10L via [facility]. It is likely she has higher O2 requirement chronically than the amount she has been using at home.   - O2 sat goal at 90-94%, wean oxygen as tolerated  - continue home pulmicort, brovana, PRN albuterol, azithromycin  - please cont airway clearance:              - duonebs TID and HTS (3%) BID with [place]  - please provide COVID 19 vaccine      Chronic Pulmonary Aspergillosis   Aspergillus Galactomannan Antigen, IgE checked 10/2022 all wnl.   - continue home posaconazole   - would cancel BAE next week    Ms. Thammavong was seen, examined and discussed with  Dr. Judie Petit. [name]   Thank you for involving [place] in her care. We look forward to following with you.  Please don't hesitate to page [place] with any questions or concerns at [phone] (pulmonary consult fellow).    Kandis Fantasia, MD  Pulmonary & Critical Care Fellow      SUBJECTIVE:     No acute events overnight, doing well on Oxymizer.  Looking forward to discharge.  No hemoptysis, did not do well with AVAPS as she reports feeling like she was unable to completely exhale prior to the next breath delivery.  CPAP has been more comfortable for her, open to adjusting settings on her machine.    Medications:     Scheduled Meds:   acetaminophen  650 mg Oral Q6H    arformoterol  15 mcg Nebulization BID (RT)    azithromycin  250 mg Oral Daily    budesonide  0.5 mg Nebulization BID (RT)    diclofenac sodium  2 g Topical QID    enoxaparin (LOVENOX) injection  40 mg Subcutaneous Nightly    flu vacc [id number] 6mos up(PF)  0.5 mL Intramuscular During hospitalization    furosemide  120 mg Oral BID    gabapentin  100 mg Oral TID    iloprost  5 mcg Nebulization 6XD    insulin glargine  12 Units Subcutaneous Nightly    insulin lispro  0-20 Units Subcutaneous ACHS    insulin lispro  3 Units Subcutaneous 3xd Meals    ipratropium-albuterol  3 mL Nebulization Q12H (RT)    lidocaine  1 patch  Transdermal Daily    [Provider Hold] magnesium oxide-Mg AA chelate  2 tablet Oral BID    posaconazole  200 mg Oral Daily    sildenafiL (pulm.hypertension)  40 mg Oral TID    sodium chloride  4 mL Nebulization BID     Continuous Infusions:  PRN Meds:.albuterol, aluminum-magnesium hydroxide-simethicone, calcium carbonate, dextrose in water, glucagon, melatonin, ondansetron **OR** ondansetron, polyethylene glycol, prochlorperazine, senna, sodium chloride     PHYSICAL EXAM:   BP 86/66  - Pulse 85  - Temp 35.7 ??C (96.2 ??F) (Axillary)  - Resp 20  - Ht 180.3 cm (5' 11)  - Wt 83.5 kg (184 lb 1.6 oz)  - LMP  (LMP Unknown)  - SpO2 97%  - BMI 25.68 kg/m??   General: NAD, rouses easily, sitting in bed  HEENT: Moist mucous membranes, no conjunctival pallor  Respiratory: Respirations unlabored on Oxymizer, coarse crackles bilaterally with diminished breath sounds in the apices  Cardiovascular: Regular rate and rhythm, no peripheral edema  Extremities: Moves all extremities well, normal capillary refill  Neurologic: Alert & Oriented  Psychiatric: Age appropriate, logical thought, full range of affect      LABORATORY and RADIOLOGY DATA:     Pertinent Laboratory Data from the last 24 hours:  Lab Results   Component Value Date    WBC 3.4 (L) 01/07/2023    HGB 12.1 01/07/2023    HCT 37.3 01/07/2023    PLT 154 01/07/2023     Lab Results Component Value Date    NA 139 01/07/2023    K 4.0 01/07/2023    CL 93 (L) 01/07/2023    CO2 38.0 (H) 01/07/2023    BUN 60 (H) 01/07/2023    CREATININE 2.28 (H) 01/07/2023    GLU 96 01/07/2023    CALCIUM 9.7 01/07/2023    MG 2.1 01/07/2023    PHOS 5.3 (H) 01/07/2023       Lab Results   Component Value Date    BILITOT 0.7 01/03/2023    BILIDIR 0.20 01/03/2023    PROT 7.4 01/03/2023    ALBUMIN 3.9 01/03/2023    ALT 25 01/03/2023    AST 22 01/03/2023    ALKPHOS 121 (H) 01/03/2023    GGT 60 (H) 05/31/2011       Lab Results   Component Value Date    INR 1.04 01/04/2023    APTT 37.2 01/04/2023       Pertinent Micro Data:  Microbiology Results (last day)       ** No results found for the last 24 hours. **             Pertinent Imaging Data from the last 24 hours:  No new imaging in the last 24 hours

## 2023-01-07 NOTE — Unmapped (Signed)
Infectious Disease (MEDK) Progress Note    Assessment & Plan:   Karen Barber is a 57 y.o. female whose presentation is complicated by bronchiectasis type 1 in the setting of pulmonary sarcoidosis (6L [place] home O2), HTN, T2DM, COPD, s/p LUL lobectomy, and asthma that presented to [facility] admitted to MICU after becoming hypoxic after holding home meds prior to PET scan. After 1 week diuresing in ICU, patient stabilized and transferred to the [facility] where she has continued diuresing and we have been working on obtaining discharge medications and supplies for her.    Principal Problem:    Acute on chronic hypoxic respiratory failure (CMS-HCC)  Active Problems:    Pulmonary sarcoidosis (CMS-HCC)    Type 2 diabetes mellitus with hyperglycemia (CMS-HCC)    COPD (chronic obstructive pulmonary disease) (CMS-HCC)    Chronic pulmonary aspergillosis (CMS-HCC)    Pulmonary hypertension (CMS-HCC)    AKI (acute kidney injury) (CMS-HCC)    Active Problems  #Acute on Chronic Hypoxemic Respiratory Failure -   #Pulmonary Sarcoidosis -   #COPD/asthma -   #Bronchiectasis  Baseline oxygen 6L. Severe PAH w/ RV failure and limited treatment options. Followed by Pulm. Stable O2 req today (~7L LBNC) with robust UOP on oral Lasix 120 mg BID.  Continues to have severe O2 desaturations with exertion. Thoughtful discussion between Med K, Pulmonology consultants, and patient on 10/18 surrounding overall prognosis and GOC. Patient desires making it to her wedding in May with an important goal to get out of the hospital.  She is open to home palliative care services for symptom management.  Planning for Tyvaso equipment and teaching on 10/21 and palliative care referral for home services and eventual transition to hospice on discharge.  -- Continue to wean O2 with goal saturations of 90-94% per pulmonology, currently requiring 7-8L.  -- Continue home airway clearance (DuoNebs and HTS 3 times daily with [place])  -- Continue home Pulmicort, Brovana, Atrovent, as needed albuterol, and azithromycin  -- PT/OT: 5xL  -- Standing weights daily  -- Nephrology signed off. Recommend discharging on 120 mg PO Lasix BID and they will set up outpatient follow-up  -- Plan for palliative care referral on discharge  -- Evaluate if patient able to perform ADLs on [facility] with oxymizer    #Pulmonary hypertension - RV failure - Orthostatic hypotension  History of pulmonary hypertension secondary to pulmonary sarcoidosis/COPD.  TTE this admission with severely dilated right atrium with severe pulmonary hypertension PASP 86 mmHg.  Estimated right atrial pressure 5 to 10 mmHg.  On Lasix 80 mg twice daily at home.  Echo showed LVEF > 55% and severe pulmonary hypertension without significant change from prior.  Right heart cath showed severe pulmonary hypertension, markedly elevated PVR (9.3 - 10.5) and low cardiac output / index. HRCT scan shows unchanged fibrotic pulmonary sarcoidosis with bilateral segmental and subsegmental fibrosis post left upper lobectomy with unchanged apical right upper lobe and superior segment left lower lobe bullae.   -- Continue inhaled iloprost 5 mcg 6 times a day q4h; please do not mix with other nebulizer solutions   -- Plan to transition to Tyvaso nebs, working with pharmacy and [facility] nurse coordinator  -- [facility] rep to do teaching prior to DC - likely on 10/21 v 10/22    #Acute kidney injury on CKD3  Stably elevated. Creatinine 1.3 on admission which seems to be baseline. Hospital course with rise of creatinine up to 3. Permissive hypercreatinemia given tenuous and narrow euvolemia window with respiratory status.   --  Creatinine 2.28 today  -- Renally dose medications  -- Daily BMP    #Secondary Hyperparathyroidism, Hypocalcemia, and Hyperphosphatemia  Secondary hyperparathyroidism due to CKD.  -- Reconsider need for future oral calcium carbonate 1600 mg TID  -- Reconsider need for future phoslo 667 mg PO TID   -- Reassess PTH levels in 4-6 weeks. Last checked 9/22.    #Type 2 diabetes mellitus with hyperglycemia and hypoglycemia  Hemoglobin A1c 8.3%.  Home regimen includes glargine 18 units nightly and sliding scale insulin.  Large fluctuations in glucose greater than 300 and occasional values ~50.  -- Sliding scale insulin insulin sensitivity factor 40  --12 glargine nightly  -- 3 units lispro tid      Chronic Problems     #Chronic Pain   - Gabapentin resumed at reduced dose     #Chronic pulmonary aspergillosis  On chronic posaconazole. Soluble IL-2 receptor, Aspergillus Galactomannan Antigen, IgE checked 10/2022 all wnl  -Continue home posaconazole Ppx  -Outpatient evaluation for BAE scheduled for 10/25 to be cancelled per pulmonology recommendation.     #Iron deficiency anemia  #Chronic normocytic anemia (baseline hemoglobin 11)  --Iron supplementation with iron dextran 1,000mg  in 250cc on 9/23  --Iron level improving but still low   --Given it can take 4-6+ weeks for dextran to have full effects, will hold off on another iron infusion for now    #Hyperlipidemia  LDL of 146  -- Follow-up outpatient    Issues Impacting Complexity of Management:  -The patient is at high risk for the development of complications of volume overload due to the need to provide IV hydration for suspected hypovolemia in the setting of: Pulmonary hypertension  -Need for the following intensive monitoring parameter(s) due to high risk of clinical decline: continuous oxygen monitoring, telemetry, and frequent monitoring of urine output to assess for the efficacy of diuretic regimen  -Need for intensive oxygen therapy of HFNC, which places the patient at high risk for oxygen toxicity  -The patient is at high risk of complications from pulmonary hypertension    Daily Checklist:  Diet: Regular Diet   DVT PPx: Heparin 5000units q8h  Electrolytes: Replete Potassium to >/= 3.6 and Magnesium to >/= 1.8  Code Status: DNR and DNI  Dispo: Working towards discharge 10/21 to be 10/22.  Barriers include Tyvaso applicator and teaching, [facility] delivery of HTS    Team Contact Information:   Primary Team: Infectious Disease (MEDK)  Primary Resident: Sindy Guadeloupe, DO  Resident's Pager: [phone] (Infect Disease Intern- Tower)    Interval History:     No acute events overnight.     Patient doing well this morning. Hoping to discharge home tomorrow. Explained that we need to ensure all her medications, equipment, supplies and teaching have been established prior to her discharging. Patient understands and denies any concerns or complaints.    ROS negative except for where otherwise noted above.    Objective:   Temp:  [35.7 ??C (96.2 ??F)-36.3 ??C (97.4 ??F)] 35.7 ??C (96.2 ??F)  Heart Rate:  [77-90] 85  SpO2 Pulse:  [81-94] 85  Resp:  [18-31] 20  BP: (86-89)/(57-67) 86/66  FiO2 (%):  [50 %] 50 %  SpO2:  [84 %-99 %] 97 %,   Intake/Output Summary (Last 24 hours) at 01/07/2023 1241  Last data filed at 01/07/2023 1229  Gross per 24 hour   Intake 700 ml   Output 1450 ml   Net -750 ml   , S RR:  [  8] 8  FiO2 (%):  [50 %] 50 %  O2 Device: Oxygen reservoir cannula  O2 Flow Rate (L/min):  [6 L/min-8 L/min] 6 L/min    Gen: NAD, converses well in full sentences  HENT: Atraumatic, normocephalic  Heart: RRR,   Lungs: Normal work of breathing on 8L LBNC. Scattered wheezes and rhonchi.  Abdomen: Soft, nontender nondistended  Extremities: No lower extremity edema

## 2023-01-08 LAB — BASIC METABOLIC PANEL
ANION GAP: 7 mmol/L (ref 5–14)
ANION GAP: 7 mmol/L (ref 5–14)
BLOOD UREA NITROGEN: 71 mg/dL — ABNORMAL HIGH (ref 9–23)
BLOOD UREA NITROGEN: 72 mg/dL — ABNORMAL HIGH (ref 9–23)
BUN / CREAT RATIO: 32
BUN / CREAT RATIO: 31
CALCIUM: 9.9 mg/dL (ref 8.7–10.4)
CALCIUM: 9.9 mg/dL (ref 8.7–10.4)
CHLORIDE: 94 mmol/L — ABNORMAL LOW (ref 98–107)
CHLORIDE: 92 mmol/L — ABNORMAL LOW (ref 98–107)
CO2: 36 mmol/L — ABNORMAL HIGH (ref 20.0–31.0)
CO2: 38 mmol/L — ABNORMAL HIGH (ref 20.0–31.0)
CREATININE: 2.2 mg/dL — ABNORMAL HIGH
CREATININE: 2.31 mg/dL — ABNORMAL HIGH
EGFR CKD-EPI (2021) FEMALE: 25 mL/min/{1.73_m2} — ABNORMAL LOW (ref >=60)
EGFR CKD-EPI (2021) FEMALE: 24 mL/min/{1.73_m2} — ABNORMAL LOW (ref >=60)
GLUCOSE RANDOM: 108 mg/dL (ref 70–179)
GLUCOSE RANDOM: 120 mg/dL (ref 70–179)
POTASSIUM: 3.6 mmol/L (ref 3.4–4.8)
POTASSIUM: 4.2 mmol/L (ref 3.4–4.8)
SODIUM: 137 mmol/L (ref 135–145)
SODIUM: 137 mmol/L (ref 135–145)

## 2023-01-08 LAB — CBC
HEMATOCRIT: 36.1 % (ref 34.0–44.0)
HEMOGLOBIN: 11.8 g/dL (ref 11.3–14.9)
MEAN CORPUSCULAR HEMOGLOBIN CONC: 32.7 g/dL (ref 32.0–36.0)
MEAN CORPUSCULAR HEMOGLOBIN: 27.2 pg (ref 25.9–32.4)
MEAN CORPUSCULAR VOLUME: 83.1 fL (ref 77.6–95.7)
MEAN PLATELET VOLUME: 8.1 fL (ref 6.8–10.7)
PLATELET COUNT: 160 10*9/L (ref 150–450)
RED BLOOD CELL COUNT: 4.34 10*12/L (ref 3.95–5.13)
RED CELL DISTRIBUTION WIDTH: 17.5 % — ABNORMAL HIGH (ref 12.2–15.2)
WBC ADJUSTED: 3.4 10*9/L — ABNORMAL LOW (ref 3.6–11.2)

## 2023-01-08 LAB — MAGNESIUM
MAGNESIUM: 2.1 mg/dL (ref 1.6–2.6)
MAGNESIUM: 2.1 mg/dL (ref 1.6–2.6)

## 2023-01-08 LAB — PRO-BNP: PRO-BNP: 7342 pg/mL — ABNORMAL HIGH (ref <=300.0)

## 2023-01-08 LAB — PHOSPHORUS
PHOSPHORUS: 4.9 mg/dL (ref 2.4–5.1)
PHOSPHORUS: 5 mg/dL (ref 2.4–5.1)

## 2023-01-08 LAB — LACTATE, VENOUS, WHOLE BLOOD: LACTATE BLOOD VENOUS: 1.3 mmol/L (ref 0.5–1.8)

## 2023-01-08 MED ORDER — INSULIN GLARGINE (U-100) 100 UNIT/ML SUBCUTANEOUS SOLUTION
Freq: Every evening | SUBCUTANEOUS | 0 refills | 30 days | Status: CN
Start: 2023-01-08 — End: 2023-02-07

## 2023-01-08 MED ORDER — INSULIN LISPRO (U-100) 100 UNIT/ML SUBCUTANEOUS SOLUTION
Freq: Three times a day (TID) | SUBCUTANEOUS | 0 refills | 111 days | Status: CN
Start: 2023-01-08 — End: 2023-02-07

## 2023-01-08 MED ORDER — SODIUM CHLORIDE 3 % FOR NEBULIZATION
Freq: Two times a day (BID) | RESPIRATORY_TRACT | 0 refills | 30 days
Start: 2023-01-08 — End: 2023-02-07

## 2023-01-08 MED ORDER — SILDENAFIL (PULMONARY HYPERTENSION) 20 MG TABLET
ORAL_TABLET | Freq: Three times a day (TID) | ORAL | 0 refills | 30 days
Start: 2023-01-08 — End: 2023-02-07

## 2023-01-08 MED ORDER — BASAGLAR KWIKPEN U-100 INSULIN 100 UNIT/ML (3 ML) SUBCUTANEOUS
Freq: Every evening | SUBCUTANEOUS | 0 refills | 30 days
Start: 2023-01-08 — End: 2023-02-07

## 2023-01-08 MED ORDER — GABAPENTIN 100 MG CAPSULE
ORAL_CAPSULE | Freq: Three times a day (TID) | ORAL | 0 refills | 30 days
Start: 2023-01-08 — End: 2023-02-07

## 2023-01-08 MED ORDER — INSULIN ASPART (NIACINAMIDE)(U-100) 100 UNIT/ML(3 ML) SUBCUTANEOUS PEN
Freq: Three times a day (TID) | SUBCUTANEOUS | 0 refills | 30 days
Start: 2023-01-08 — End: 2023-02-07

## 2023-01-08 MED ORDER — IPRATROPIUM 0.5 MG-ALBUTEROL 3 MG (2.5 MG BASE)/3 ML NEBULIZATION SOLN
Freq: Two times a day (BID) | RESPIRATORY_TRACT | 0 refills | 30 days
Start: 2023-01-08 — End: 2023-02-07

## 2023-01-08 MED ADMIN — treprostinil (TYVASO) inhalation solution 18 mcg: 18 ug

## 2023-01-08 MED ADMIN — budesonide (PULMICORT) nebulizer solution 0.5 mg: .5 mg | RESPIRATORY_TRACT | @ 12:00:00

## 2023-01-08 MED ADMIN — heparin (porcine) 5,000 unit/mL injection 5,000 Units: 5000 [IU] | SUBCUTANEOUS | @ 19:00:00

## 2023-01-08 MED ADMIN — heparin (porcine) 5,000 unit/mL injection 5,000 Units: 5000 [IU] | SUBCUTANEOUS | @ 02:00:00

## 2023-01-08 MED ADMIN — azithromycin (ZITHROMAX) tablet 250 mg: 250 mg | ORAL | @ 13:00:00 | Stop: 2025-08-25

## 2023-01-08 MED ADMIN — sildenafiL (pulm.hypertension) (REVATIO) tablet 40 mg: 40 mg | ORAL | @ 19:00:00

## 2023-01-08 MED ADMIN — diclofenac sodium (VOLTAREN) 1 % gel 2 g: 2 g | TOPICAL | @ 02:00:00

## 2023-01-08 MED ADMIN — iloprost (VENTAVIS) nebulizer solution: 5 ug | RESPIRATORY_TRACT | @ 04:00:00 | Stop: 2023-01-08

## 2023-01-08 MED ADMIN — insulin lispro (HumaLOG) injection 3 Units: 3 [IU] | SUBCUTANEOUS | @ 16:00:00

## 2023-01-08 MED ADMIN — insulin lispro (HumaLOG) injection 0-20 Units: 0-20 [IU] | SUBCUTANEOUS | @ 02:00:00

## 2023-01-08 MED ADMIN — budesonide (PULMICORT) nebulizer solution 0.5 mg: .5 mg | RESPIRATORY_TRACT | @ 01:00:00

## 2023-01-08 MED ADMIN — ipratropium-albuterol (DUO-NEB) 0.5-2.5 mg/3 mL nebulizer solution 3 mL: 3 mL | RESPIRATORY_TRACT | @ 01:00:00

## 2023-01-08 MED ADMIN — arformoterol (BROVANA) nebulizer solution 15 mcg/2 mL: 15 ug | RESPIRATORY_TRACT | @ 12:00:00

## 2023-01-08 MED ADMIN — insulin lispro (HumaLOG) injection 3 Units: 3 [IU] | SUBCUTANEOUS | @ 22:00:00

## 2023-01-08 MED ADMIN — posaconazole (NOXAFIL) delayed released tablet 200 mg: 200 mg | ORAL | @ 13:00:00

## 2023-01-08 MED ADMIN — furosemide (LASIX) tablet 120 mg: 120 mg | ORAL | @ 09:00:00

## 2023-01-08 MED ADMIN — sodium chloride 3 % NEBULIZER solution 4 mL: 4 mL | RESPIRATORY_TRACT | @ 12:00:00

## 2023-01-08 MED ADMIN — acetaminophen (TYLENOL) tablet 650 mg: 650 mg | ORAL | @ 07:00:00

## 2023-01-08 MED ADMIN — acetaminophen (TYLENOL) tablet 650 mg: 650 mg | ORAL | @ 13:00:00

## 2023-01-08 MED ADMIN — iloprost (VENTAVIS) nebulizer solution: 5 ug | RESPIRATORY_TRACT | @ 12:00:00 | Stop: 2023-01-08

## 2023-01-08 MED ADMIN — gabapentin (NEURONTIN) capsule 100 mg: 100 mg | ORAL | @ 01:00:00

## 2023-01-08 MED ADMIN — oxyCODONE (ROXICODONE) immediate release tablet 5 mg: 5 mg | ORAL | @ 07:00:00 | Stop: 2023-01-08

## 2023-01-08 MED ADMIN — gabapentin (NEURONTIN) capsule 100 mg: 100 mg | ORAL | @ 19:00:00

## 2023-01-08 MED ADMIN — sodium chloride 3 % NEBULIZER solution 4 mL: 4 mL | RESPIRATORY_TRACT | @ 01:00:00

## 2023-01-08 MED ADMIN — gabapentin (NEURONTIN) capsule 100 mg: 100 mg | ORAL | @ 13:00:00

## 2023-01-08 MED ADMIN — sildenafiL (pulm.hypertension) (REVATIO) tablet 40 mg: 40 mg | ORAL | @ 01:00:00

## 2023-01-08 MED ADMIN — sildenafiL (pulm.hypertension) (REVATIO) tablet 40 mg: 40 mg | ORAL | @ 13:00:00

## 2023-01-08 MED ADMIN — heparin (porcine) 5,000 unit/mL injection 5,000 Units: 5000 [IU] | SUBCUTANEOUS | @ 09:00:00

## 2023-01-08 MED ADMIN — arformoterol (BROVANA) nebulizer solution 15 mcg/2 mL: 15 ug | RESPIRATORY_TRACT | @ 01:00:00

## 2023-01-08 MED ADMIN — iloprost (VENTAVIS) nebulizer solution: 5 ug | RESPIRATORY_TRACT | @ 01:00:00

## 2023-01-08 MED ADMIN — ipratropium-albuterol (DUO-NEB) 0.5-2.5 mg/3 mL nebulizer solution 3 mL: 3 mL | RESPIRATORY_TRACT | @ 12:00:00

## 2023-01-08 MED ADMIN — insulin glargine (LANTUS) injection 12 Units: 12 [IU] | SUBCUTANEOUS | @ 01:00:00

## 2023-01-08 MED ADMIN — furosemide (LASIX) tablet 120 mg: 120 mg | ORAL | @ 19:00:00

## 2023-01-08 NOTE — Unmapped (Signed)
Infectious Disease (MEDK) Progress Note    Assessment & Plan:   Karen Barber is a 57 y.o. female whose presentation is complicated by bronchiectasis type 1 in the setting of pulmonary sarcoidosis (6L [place] home O2), HTN, T2DM, COPD, s/p LUL lobectomy, and asthma that presented to [facility] admitted to MICU after becoming hypoxic after holding home meds prior to PET scan. After 1 week diuresing in ICU, patient stabilized and transferred to the [facility] where she has continued diuresing and we have been working on obtaining discharge medications and supplies for her.    Principal Problem:    Acute on chronic hypoxic respiratory failure (CMS-HCC)  Active Problems:    Pulmonary sarcoidosis (CMS-HCC)    Type 2 diabetes mellitus with hyperglycemia (CMS-HCC)    COPD (chronic obstructive pulmonary disease) (CMS-HCC)    Chronic pulmonary aspergillosis (CMS-HCC)    Pulmonary hypertension (CMS-HCC)    AKI (acute kidney injury) (CMS-HCC)    Active Problems  #Acute on Chronic Hypoxemic Respiratory Failure -   #Pulmonary Sarcoidosis -   #COPD/asthma -   #Bronchiectasis  Baseline oxygen 6L. Severe PAH w/ RV failure and limited treatment options. Followed by Pulm. Stable O2 req today (~7L LBNC) with robust UOP on oral Lasix 120 mg BID.  Continues to have severe O2 desaturations with exertion. Thoughtful discussion between Med K, Pulmonology consultants, and patient on 10/18 surrounding overall prognosis and GOC. Patient desires making it to her wedding in May with an important goal to get out of the hospital.  She is open to home palliative care services for symptom management.  Planning for Tyvaso equipment and teaching on 10/21 and palliative care referral for home services and eventual transition to hospice on discharge.  -- Continue to wean O2 with goal saturations of 90-94% per pulmonology, currently requiring 7-8L.  -- Continue home airway clearance (DuoNebs and HTS 3 times daily with [place])  -- Continue home Pulmicort, Brovana, Atrovent, as needed albuterol, and azithromycin  -- PT/OT: 5xL  -- Standing weights daily  -- Nephrology signed off. Recommend discharging on 120 mg PO Lasix BID and they will set up outpatient follow-up  -- Plan for palliative care referral on discharge  -- Evaluate if patient able to perform ADLs on [facility] with oxymizer    #Pulmonary hypertension - RV failure - Orthostatic hypotension  History of pulmonary hypertension secondary to pulmonary sarcoidosis/COPD.  TTE this admission with severely dilated right atrium with severe pulmonary hypertension PASP 86 mmHg.  Estimated right atrial pressure 5 to 10 mmHg.  On Lasix 80 mg twice daily at home.  Echo showed LVEF > 55% and severe pulmonary hypertension without significant change from prior.  Right heart cath showed severe pulmonary hypertension, markedly elevated PVR (9.3 - 10.5) and low cardiac output / index. HRCT scan shows unchanged fibrotic pulmonary sarcoidosis with bilateral segmental and subsegmental fibrosis post left upper lobectomy with unchanged apical right upper lobe and superior segment left lower lobe bullae.   -- Continue inhaled iloprost 5 mcg 6 times a day q4h; please do not mix with other nebulizer solutions   -- Plan to transition to Tyvaso nebs, working with pharmacy and [facility] nurse coordinator  -- [facility] rep to do teaching today    #Acute kidney injury on CKD3  Stably elevated. Creatinine 1.3 on admission which seems to be baseline. Hospital course with rise of creatinine up to 3. Permissive hypercreatinemia given tenuous and narrow euvolemia window with respiratory status.   -- Creatinine 2.20 today  --  Renally dose medications  -- Daily BMP    #Secondary Hyperparathyroidism, Hypocalcemia, and Hyperphosphatemia  Secondary hyperparathyroidism due to CKD.  -- Reconsider need for future oral calcium carbonate 1600 mg TID  -- Reconsider need for future phoslo 667 mg PO TID   -- Reassess PTH levels in 4-6 weeks. Last checked 9/22.    #Type 2 diabetes mellitus with hyperglycemia and hypoglycemia  Hemoglobin A1c 8.3%.  Home regimen includes glargine 18 units nightly and sliding scale insulin.  Large fluctuations in glucose greater than 300 and occasional values ~50.  -- Sliding scale insulin insulin sensitivity factor 40  --12 glargine nightly  -- 3 units lispro tid      Chronic Problems     #Chronic Pain   - Gabapentin resumed at reduced dose     #Chronic pulmonary aspergillosis  On chronic posaconazole. Soluble IL-2 receptor, Aspergillus Galactomannan Antigen, IgE checked 10/2022 all wnl  -Continue home posaconazole Ppx  -Outpatient evaluation for BAE scheduled for 10/25 to be cancelled per pulmonology recommendation.     #Iron deficiency anemia  #Chronic normocytic anemia (baseline hemoglobin 11)  --Iron supplementation with iron dextran 1,000mg  in 250cc on 9/23  --Iron level improving but still low   --Given it can take 4-6+ weeks for dextran to have full effects, will hold off on another iron infusion for now    #Hyperlipidemia  LDL of 146  -- Follow-up outpatient    Issues Impacting Complexity of Management:  -The patient is at high risk for the development of complications of volume overload due to the need to provide IV hydration for suspected hypovolemia in the setting of: Pulmonary hypertension  -Need for the following intensive monitoring parameter(s) due to high risk of clinical decline: continuous oxygen monitoring, telemetry, and frequent monitoring of urine output to assess for the efficacy of diuretic regimen  -Need for intensive oxygen therapy of HFNC, which places the patient at high risk for oxygen toxicity  -The patient is at high risk of complications from pulmonary hypertension    Daily Checklist:  Diet: Regular Diet   DVT PPx: Heparin 5000units q8h  Electrolytes: Replete Potassium to >/= 3.6 and Magnesium to >/= 1.8  Code Status: DNR and DNI  Dispo: Working towards discharge 10/21 to be 10/22.  Barriers include Tyvaso applicator and teaching, [facility] pharmacy delivery of HTS    Team Contact Information:   Primary Team: Infectious Disease (MEDK)  Primary Resident: Sindy Guadeloupe, DO  Resident's Pager: [phone] (Infect Disease Intern- Tower)    Interval History:     No acute events overnight.     Patient doing well this morning. Hoping to discharge today. Discussed we need to ensure Tyvaso teaching gets completed and that she has all the supplies she needs prior to discharge. Patient understands.    ROS negative except for where otherwise noted above.    Objective:   Temp:  [35.7 ??C (96.2 ??F)-36.7 ??C (98 ??F)] 36.7 ??C (98 ??F)  Heart Rate:  [77-88] 77  SpO2 Pulse:  [77-88] 77  Resp:  [20-25] 24  BP: (76-88)/(52-67) 84/57  FiO2 (%):  [50 %] 50 %  SpO2:  [91 %-98 %] 93 %,   Intake/Output Summary (Last 24 hours) at 01/08/2023 0655  Last data filed at 01/08/2023 0324  Gross per 24 hour   Intake 340 ml   Output 1250 ml   Net -910 ml   , S RR:  [8] 8  FiO2 (%):  [50 %] 50 %  O2 Device: Large bore nasal cannula (cool high flow)  O2 Flow Rate (L/min):  [6 L/min-8 L/min] 8 L/min    Gen: NAD, converses well in full sentences  HENT: Atraumatic, normocephalic  Heart: RRR,   Lungs: Normal work of breathing on 8L LBNC. Scattered wheezing  Abdomen: Soft, nontender nondistended  Extremities: No lower extremity edema

## 2023-01-08 NOTE — Unmapped (Signed)
Assessment: Karen Barber is a 57 y.o. female who was admitted for Acute on chronic hypoxic respiratory failure (CMS-HCC)       Shift Events:         Current Hospital Length of Stay: 40 Days    INFUSIONS:      VITALS:  Vitals:    01/04/23 1015 01/07/23 0640   Weight: 86.6 kg (190 lb 14.7 oz) 83.5 kg (184 lb 1.6 oz)      Weight change:      Temp:  [35.7 ??C (96.2 ??F)-36.7 ??C (98 ??F)] 36.7 ??C (98 ??F)  Heart Rate:  [77-88] 77  SpO2 Pulse:  [77-88] 77  Resp:  [20-25] 24  BP: (76-88)/(52-67) 84/57  MAP (mmHg):  [58-71] 62  FiO2 (%):  [50 %] 50 %  SpO2:  [91 %-98 %] 93 %     VENT/OXYGEN SETTINGS:  FiO2 (%): 50 %  S RR: 8    INS & OUTS:  Date 01/07/23 0701 - 01/08/23 0700 01/08/23 0701 - 01/09/23 0700   Shift 0701-1900 1901-0700 24 Hour Total 0701-1900 1901-0700 24 Hour Total   INTAKE   P.O. 100 240 340      Shift Total 100 240 340      OUTPUT   Urine [phone]      Shift Total [phone]        Intake/Output Summary (Last 24 hours) at 01/08/2023 0655  Last data filed at 01/08/2023 0324  Gross per 24 hour   Intake 340 ml   Output 1250 ml   Net -910 ml        ACCESS:  Patient Lines/Drains/Airways Status       Active Active Lines, Drains, & Airways       Name Placement date Placement time Site Days    External Urinary Device 12/02/22 With Suction 12/02/22  1900  -- 36    Peripheral IV 01/01/23 Posterior;Right Forearm 01/01/23  0543  Forearm  7                           See flowsheets/MAR for further information      Lilyan Gilford, RN  January 08, 2023 6:55 AM                                                    Problem: Adult Inpatient Plan of Care  Goal: Plan of Care Review  Outcome: Ongoing - Unchanged  Goal: Patient-Specific Goal (Individualized)  Outcome: Ongoing - Unchanged  Goal: Absence of Hospital-Acquired Illness or Injury  Outcome: Ongoing - Unchanged  Intervention: Identify and Manage Fall Risk  Recent Flowsheet Documentation  Taken 01/07/2023 2000 by Lilyan Gilford, RN  Safety Interventions:   aspiration precautions   commode/urinal/bedpan at bedside   lighting adjusted for tasks/safety   low bed   nonskid shoes/slippers when out of bed   room near unit station  Intervention: Prevent and Manage VTE (Venous Thromboembolism) Risk  Recent Flowsheet Documentation  Taken 01/07/2023 1945 by Lilyan Gilford, RN  VTE Prevention/Management:   fluids promoted   intravenous hydration  Intervention: Prevent Infection  Recent Flowsheet Documentation  Taken 01/07/2023 2000 by Lilyan Gilford, RN  Infection Prevention:   cohorting utilized   equipment surfaces disinfected   hand hygiene promoted  personal protective equipment utilized   rest/sleep promoted   single patient room provided  Goal: Optimal Comfort and Wellbeing  Outcome: Ongoing - Unchanged  Goal: Readiness for Transition of Care  Outcome: Ongoing - Unchanged  Goal: Rounds/Family Conference  Outcome: Ongoing - Unchanged     Problem: Fall Injury Risk  Goal: Absence of Fall and Fall-Related Injury  Outcome: Ongoing - Unchanged  Intervention: Promote Scientist, clinical (histocompatibility and immunogenetics) Documentation  Taken 01/07/2023 2000 by Lilyan Gilford, RN  Safety Interventions:   aspiration precautions   commode/urinal/bedpan at bedside   lighting adjusted for tasks/safety   low bed   nonskid shoes/slippers when out of bed   room near unit station     Problem: Gas Exchange Impaired  Goal: Optimal Gas Exchange  Outcome: Ongoing - Unchanged  Intervention: Optimize Oxygenation and Ventilation  Recent Flowsheet Documentation  Taken 01/07/2023 2000 by Lilyan Gilford, RN  Head of Bed [facility]) Positioning: HOB at 30-45 degrees  Taken 01/07/2023 1931 by Lilyan Gilford, RN  Head of Bed [facility]) Positioning: HOB at 45 degrees     Problem: Comorbidity Management  Goal: Blood Glucose Levels Within Targeted Range  Outcome: Ongoing - Unchanged  Intervention: Monitor and Manage Glycemia  Recent Flowsheet Documentation  Taken 01/07/2023 2000 by Lilyan Gilford, RN  Glycemic Management: blood glucose monitored  Goal: Blood Pressure in Desired Range  Outcome: Ongoing - Unchanged     Problem: Skin Injury Risk Increased  Goal: Skin Health and Integrity  Outcome: Ongoing - Unchanged  Intervention: Optimize Skin Protection  Recent Flowsheet Documentation  Taken 01/07/2023 2000 by Lilyan Gilford, RN  Activity Management: bedrest  Pressure Reduction Techniques:   frequent weight shift encouraged   heels elevated off bed  Head of Bed (HOB) Positioning: HOB at 30-45 degrees  Pressure Reduction Devices: pressure-redistributing mattress utilized  Taken 01/07/2023 1945 by Lilyan Gilford, RN  Pressure Reduction Techniques:   frequent weight shift encouraged   heels elevated off bed  Pressure Reduction Devices: pressure-redistributing mattress utilized  Taken 01/07/2023 1931 by Lilyan Gilford, RN  Head of Bed [facility]) Positioning: HOB at 45 degrees

## 2023-01-08 NOTE — Unmapped (Incomplete)
Pt is A&Ox4. NSR with HR. Bps stable with systolics. O2 sats stable on RA. Afebrile. No c/o of pain. UO adequate, no BM this shift. Pt's appetite has been adequate. Skin intact, Q2H turns and standard precautions maintained. No falls/injuries this shift. On SCDs/SQ heparin/Lovenox for DVT prophylaxis. All monitors with appropriate alarm settings.     Problem: Adult Inpatient Plan of Care  Goal: Plan of Care Review  Outcome: Ongoing - Unchanged  Goal: Patient-Specific Goal (Individualized)  Outcome: Ongoing - Unchanged  Goal: Absence of Hospital-Acquired Illness or Injury  Outcome: Ongoing - Unchanged  Intervention: Identify and Manage Fall Risk  Recent Flowsheet Documentation  Taken 01/07/2023 0800 by Linward Foster, RN  Safety Interventions:   aspiration precautions   commode/urinal/bedpan at bedside   fall reduction program maintained   infection management   lighting adjusted for tasks/safety   low bed   nonskid shoes/slippers when out of bed  Intervention: Prevent Infection  Recent Flowsheet Documentation  Taken 01/07/2023 0800 by Linward Foster, RN  Infection Prevention:   cohorting utilized   personal protective equipment utilized   rest/sleep promoted   single patient room provided     Problem: Fall Injury Risk  Goal: Absence of Fall and Fall-Related Injury  Outcome: Ongoing - Unchanged  Intervention: Promote Scientist, clinical (histocompatibility and immunogenetics) Documentation  Taken 01/07/2023 0800 by Linward Foster, RN  Safety Interventions:   aspiration precautions   commode/urinal/bedpan at bedside   fall reduction program maintained   infection management   lighting adjusted for tasks/safety   low bed   nonskid shoes/slippers when out of bed     Problem: Skin Injury Risk Increased  Goal: Skin Health and Integrity  Outcome: Ongoing - Unchanged  Intervention: Optimize Skin Protection  Recent Flowsheet Documentation  Taken 01/07/2023 1605 by Linward Foster, RN  Head of Bed [facility]) Positioning: HOB at 30-45 degrees  Taken 01/07/2023 1600 by Gesenia Bantz Z, RN  Pressure Reduction Techniques:   frequent weight shift encouraged   heels elevated off bed  Pressure Reduction Devices: pressure-redistributing mattress utilized  Taken 01/07/2023 1400 by Linward Foster, RN  Pressure Reduction Techniques:   heels elevated off bed   frequent weight shift encouraged  Pressure Reduction Devices: pressure-redistributing mattress utilized  Taken 01/07/2023 1227 by Linward Foster, RN  Head of Bed [facility]) Positioning: HOB at 30-45 degrees  Taken 01/07/2023 1000 by Linward Foster, RN  Pressure Reduction Techniques: frequent weight shift encouraged  Taken 01/07/2023 0800 by Linward Foster, RN  Pressure Reduction Techniques:   frequent weight shift encouraged   heels elevated off bed  Pressure Reduction Devices: pressure-redistributing mattress utilized  Taken 01/07/2023 0758 by Linward Foster, RN  Head of Bed (HOB) Positioning: HOB at 30-45 degrees

## 2023-01-08 NOTE — Unmapped (Signed)
Pulmonary Consult Service  Follow-Up Note      Primary Service:   Medicine - Med K  Primary Service Attending:  Minerva Fester, MD  Reason for Consult:   Pulmonary hypertension    ASSESSMENT and PLAN     Karen Barber is a 57 y.o. 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 Pt was found to have acute on chronic respiratory failure (sat in 70s on home 6L) in the setting of not taking her home medications (sildenafil, lasix, nebs, inhalers) in preparation for PET scan, admitted 9/11. She was admitted to the ICU initially, and weaned back to 6-8L [place]  with airway clearance and diuresis and transitioned to [facility]PCU. Her acute on chronic hypoxemia and increase O2 requirement was likely multifactorial -- combination of volume overload as well as lack of airway clearance at home, however I think chronically her O2 requirement (related to her PH) is higher than the amount she has been on at home and she continues to have severe exertional hypoxemia probably mostly related to her parenchymal disease.      There has been a struggle to strike a balance with diuresis for her with overall poor renal function (eGFR ~15) and pHTN. She seems fairly stably increased in her O2 needs to ~7-8L [facility].  Unfortunately, there is not much more to offer her in terms of additional PAH therapies.  We can adjust her Tyvaso, but this can only be done after discharge, as she must remain on iloprost until discharge.     There have been several conversations this admission regarding her tenuous respiratory status and poor prognosis.  She is not a transplant candidate at [facility] due to prior penoplasty; considered a heart-lung transplant, but she is likely not a candidate for this either in the setting of poor renal function (GFR <50 since at least August based on chemistry and suspect even lower based on cystatin c measurements from around this time).  With her multisystem organ dysfunction, we discussed that she may have a narrow window of opportunity to remain out of the hospital while her current oxygen needs can be supported at home.  Hospice would not be in line with her current GOC, as this would entail stopping her PAH therapies, which she is not ready for.      Her primary goal is to be at home/not die in the hospital.  She also expressed desire to make it to her wedding in May. She is open to home palliative care services for symptom management; appreciate primary team coordinating. We did also discuss her upcoming BAE; she fortunately has not had hemoptysis this admission and worry that contrast administration may worsen her renal function - she is in agreement with canceling the procedure. If she has life threatening hemoptysis then would pursue.     RV Failure - PAH (Group 3, 5): RHC 9/20 this admission demonstrated severe pulmonary hypertension, markedly elevated PVR (9.3-10.5) and low CO/CI. Ultimately started on Tyvaso shortly thereafter for her PH-ILD diagnosis. She remained in the hospital for AKI and challenges managing her volume status; creatinine initially improved with diuresis (now seems to have plateaued) and repeat echo showed relatively similar findings. Unfortunately she has limited options with high baseline O2 requirement and severe lung disease.    -  Ok to discharge from pulmonary perspective as tolerated dose of Tyvaso well   - please continue home AVAPS based on her chronic hypercarbic RF.  This should be continued at discharge. I spoke to Karen Barber on 10/21 and informed them of the changes to her NIV settings.   - Defer diuresis to nephrology - currently on oral Lasix twice daily, tolerating well with stable renal function  - Continue inhaled iloprost 5 mcg q4h -> Tyvaso at discharge.   - Continue sildenafil 40 mg TID; cannot increase with current BP  - Continue daily standing weight with strict I/Os while inpatient   - pro-BNP every 2-3 days while inpatient   - please arrange home palliative care services  - we will try to arrange RHC in the outpatient setting on tyvaso      Pulmonary Sarcoidosis c/b Bronchiectasis  Soluble IL-2 receptor, Aspergillus Galactomannan Antigen, IgE checked 10/2022 all wnl. PET scan 9/17 does not appear to be active pulmonary sarcoidosis and again it is overall felt to be burned out from sarcoidosis standpoint. She is now stable satting at >95% on 8-10L via [facility]. It is likely she has higher O2 requirement chronically than the amount she has been using at home.   - O2 sat goal at 90-94%, wean oxygen as tolerated  - continue home pulmicort, brovana, PRN albuterol, azithromycin  - please cont airway clearance:              - duonebs TID and HTS (3%) BID with [place]  - please provide COVID 19 vaccine      Chronic Pulmonary Aspergillosis   Aspergillus Galactomannan Antigen, IgE checked 10/2022 all wnl.   - continue home posaconazole   - would cancel BAE     Karen Barber was seen, examined and discussed with  Dr. Ignacia Marvel   Thank you for involving [place] in her care. We will sign off at this time.  Please don't hesitate to page [place] with any questions or concerns at [phone] (pulmonary consult fellow).    Birder Robson, MD  Pulmonary & Critical Care Fellow      SUBJECTIVE:     - No acute events overnight, doing well on Oxymizer.    - Oxygen requirements stable  - Got Tyvaso teaching today and dose x 1. BP stably low.   - Is excited to leave the hospital today     Medications:     Scheduled Meds:   acetaminophen  650 mg Oral Q6H    arformoterol  15 mcg Nebulization BID (RT)    azithromycin  250 mg Oral Daily    budesonide  0.5 mg Nebulization BID (RT)    diclofenac sodium  2 g Topical QID    flu vacc [id number] 6mos up(PF)  0.5 mL Intramuscular During hospitalization    furosemide  120 mg Oral BID    gabapentin  100 mg Oral TID    heparin (porcine) for subcutaneous use  5,000 Units Subcutaneous Q8H SCH    [Provider Hold] iloprost  5 mcg Nebulization 6XD    insulin glargine  12 Units Subcutaneous Nightly    insulin lispro  0-20 Units Subcutaneous ACHS    insulin lispro  3 Units Subcutaneous 3xd Meals    ipratropium-albuterol  3 mL Nebulization Q12H (RT)    lidocaine  1 patch Transdermal Daily    [Provider Hold] magnesium oxide-Mg AA chelate  2 tablet Oral BID    posaconazole  200 mg Oral Daily    sildenafiL (  pulm.hypertension)  40 mg Oral TID    sodium chloride  4 mL Nebulization BID     Continuous Infusions:  PRN Meds:.albuterol, aluminum-magnesium hydroxide-simethicone, calcium carbonate, dextrose in water, glucagon, melatonin, ondansetron **OR** ondansetron, polyethylene glycol, prochlorperazine, senna, sodium chloride     PHYSICAL EXAM:   BP 99/68  - Pulse 79  - Temp 36.4 ??C (97.6 ??F) (Oral)  - Resp 25  - Ht 180.3 cm (5' 11)  - Wt 83.5 kg (184 lb 1.6 oz)  - LMP  (LMP Unknown)  - SpO2 95%  - BMI 25.68 kg/m??   General: NAD, sitting in bed watching videos   HEENT: Moist mucous membranes, no conjunctival pallor  Respiratory: Respirations unlabored on Oxymizer  Cardiovascular: Regular rate and rhythm, no peripheral edema  Extremities: Moves all extremities well   Neurologic: Alert & Oriented  Psychiatric: Age appropriate, logical thought, full range of affect      LABORATORY and RADIOLOGY DATA:     Pertinent Laboratory Data from the last 24 hours:  Lab Results   Component Value Date    WBC 3.4 (L) 01/08/2023    HGB 11.8 01/08/2023    HCT 36.1 01/08/2023    PLT 160 01/08/2023     Lab Results   Component Value Date    NA 137 01/08/2023    K 4.2 01/08/2023    CL 92 (L) 01/08/2023    CO2 38.0 (H) 01/08/2023    BUN 72 (H) 01/08/2023    CREATININE 2.31 (H) 01/08/2023    GLU 120 01/08/2023    CALCIUM 9.9 01/08/2023    MG 2.1 01/08/2023    PHOS 5.0 01/08/2023       Lab Results   Component Value Date BILITOT 0.7 01/03/2023    BILIDIR 0.20 01/03/2023    PROT 7.4 01/03/2023    ALBUMIN 3.9 01/03/2023    ALT 25 01/03/2023    AST 22 01/03/2023    ALKPHOS 121 (H) 01/03/2023    GGT 60 (H) 05/31/2011       Lab Results   Component Value Date    INR 1.04 01/04/2023    APTT 37.2 01/04/2023       Pertinent Micro Data:  Microbiology Results (last day)       ** No results found for the last 24 hours. **             Pertinent Imaging Data from the last 24 hours:  No new imaging in the last 24 hours

## 2023-01-09 LAB — BASIC METABOLIC PANEL
ANION GAP: 6 mmol/L (ref 5–14)
BLOOD UREA NITROGEN: 72 mg/dL — ABNORMAL HIGH (ref 9–23)
BUN / CREAT RATIO: 32
CALCIUM: 9.9 mg/dL (ref 8.7–10.4)
CHLORIDE: 98 mmol/L (ref 98–107)
CO2: 36 mmol/L — ABNORMAL HIGH (ref 20.0–31.0)
CREATININE: 2.26 mg/dL — ABNORMAL HIGH
EGFR CKD-EPI (2021) FEMALE: 24 mL/min/{1.73_m2} — ABNORMAL LOW (ref >=60)
GLUCOSE RANDOM: 118 mg/dL (ref 70–179)
POTASSIUM: 3.8 mmol/L (ref 3.4–4.8)
SODIUM: 140 mmol/L (ref 135–145)

## 2023-01-09 LAB — CBC
HEMATOCRIT: 37.3 % (ref 34.0–44.0)
HEMOGLOBIN: 12.1 g/dL (ref 11.3–14.9)
MEAN CORPUSCULAR HEMOGLOBIN CONC: 32.5 g/dL (ref 32.0–36.0)
MEAN CORPUSCULAR HEMOGLOBIN: 27.5 pg (ref 25.9–32.4)
MEAN CORPUSCULAR VOLUME: 84.5 fL (ref 77.6–95.7)
MEAN PLATELET VOLUME: 7.9 fL (ref 6.8–10.7)
PLATELET COUNT: 146 10*9/L — ABNORMAL LOW (ref 150–450)
RED BLOOD CELL COUNT: 4.41 10*12/L (ref 3.95–5.13)
RED CELL DISTRIBUTION WIDTH: 17.6 % — ABNORMAL HIGH (ref 12.2–15.2)
WBC ADJUSTED: 2.9 10*9/L — ABNORMAL LOW (ref 3.6–11.2)

## 2023-01-09 LAB — PHOSPHORUS: PHOSPHORUS: 5.2 mg/dL — ABNORMAL HIGH (ref 2.4–5.1)

## 2023-01-09 LAB — MAGNESIUM: MAGNESIUM: 2.1 mg/dL (ref 1.6–2.6)

## 2023-01-09 LAB — LACTATE, VENOUS, WHOLE BLOOD: LACTATE BLOOD VENOUS: 1.1 mmol/L (ref 0.5–1.8)

## 2023-01-09 MED ORDER — BASAGLAR KWIKPEN U-100 INSULIN 100 UNIT/ML (3 ML) SUBCUTANEOUS
Freq: Every evening | SUBCUTANEOUS | 0 refills | 125 days | Status: CP
Start: 2023-01-09 — End: 2023-05-14
  Filled 2023-01-09: qty 15, 125d supply, fill #0

## 2023-01-09 MED ORDER — FUROSEMIDE 40 MG TABLET
ORAL_TABLET | Freq: Two times a day (BID) | ORAL | 0 refills | 30.00000 days | Status: CP
Start: 2023-01-09 — End: 2023-02-08
  Filled 2023-01-09: qty 180, 30d supply, fill #0

## 2023-01-09 MED ORDER — GABAPENTIN 100 MG CAPSULE
ORAL_CAPSULE | Freq: Three times a day (TID) | ORAL | 0 refills | 30 days | Status: CP
Start: 2023-01-09 — End: 2023-02-08
  Filled 2023-01-09: qty 90, 30d supply, fill #0

## 2023-01-09 MED ORDER — IPRATROPIUM 0.5 MG-ALBUTEROL 3 MG (2.5 MG BASE)/3 ML NEBULIZATION SOLN
Freq: Two times a day (BID) | RESPIRATORY_TRACT | 0 refills | 30 days | Status: CP
Start: 2023-01-09 — End: 2023-02-08
  Filled 2023-01-09: qty 180, 30d supply, fill #0

## 2023-01-09 MED ORDER — INSULIN ASPART (NIACINAMIDE)(U-100) 100 UNIT/ML(3 ML) SUBCUTANEOUS PEN
Freq: Three times a day (TID) | SUBCUTANEOUS | 0 refills | 30 days | Status: CP
Start: 2023-01-09 — End: 2023-01-09
  Filled 2023-01-09: qty 15, 140d supply, fill #0

## 2023-01-09 MED ORDER — SILDENAFIL (PULMONARY HYPERTENSION) 20 MG TABLET
ORAL_TABLET | Freq: Three times a day (TID) | ORAL | 0 refills | 30 days | Status: CP
Start: 2023-01-09 — End: 2023-02-08
  Filled 2023-01-09: qty 180, 30d supply, fill #0

## 2023-01-09 MED ORDER — SODIUM CHLORIDE 3 % FOR NEBULIZATION
Freq: Two times a day (BID) | RESPIRATORY_TRACT | 0 refills | 30 days | Status: CP
Start: 2023-01-09 — End: 2023-02-08

## 2023-01-09 MED ADMIN — budesonide (PULMICORT) nebulizer solution 0.5 mg: .5 mg | RESPIRATORY_TRACT

## 2023-01-09 MED ADMIN — arformoterol (BROVANA) nebulizer solution 15 mcg/2 mL: 15 ug | RESPIRATORY_TRACT

## 2023-01-09 MED ADMIN — insulin lispro (HumaLOG) injection 3 Units: 3 [IU] | SUBCUTANEOUS | @ 14:00:00 | Stop: 2023-01-09

## 2023-01-09 MED ADMIN — sodium chloride 3 % NEBULIZER solution 4 mL: 4 mL | RESPIRATORY_TRACT | @ 12:00:00 | Stop: 2023-01-09

## 2023-01-09 MED ADMIN — arformoterol (BROVANA) nebulizer solution 15 mcg/2 mL: 15 ug | RESPIRATORY_TRACT | @ 12:00:00 | Stop: 2023-01-09

## 2023-01-09 MED ADMIN — furosemide (LASIX) tablet 120 mg: 120 mg | ORAL | @ 10:00:00 | Stop: 2023-01-09

## 2023-01-09 MED ADMIN — treprostinil (TYVASO) inhalation solution 18 mcg: 18 ug | @ 16:00:00 | Stop: 2023-01-09

## 2023-01-09 MED ADMIN — ipratropium-albuterol (DUO-NEB) 0.5-2.5 mg/3 mL nebulizer solution 3 mL: 3 mL | RESPIRATORY_TRACT

## 2023-01-09 MED ADMIN — treprostinil (TYVASO) inhalation solution 18 mcg: 18 ug | @ 12:00:00 | Stop: 2023-01-09

## 2023-01-09 MED ADMIN — heparin (porcine) 5,000 unit/mL injection 5,000 Units: 5000 [IU] | SUBCUTANEOUS | @ 10:00:00 | Stop: 2023-01-09

## 2023-01-09 MED ADMIN — furosemide (LASIX) tablet 120 mg: 120 mg | ORAL | @ 18:00:00 | Stop: 2023-01-09

## 2023-01-09 MED ADMIN — heparin (porcine) 5,000 unit/mL injection 5,000 Units: 5000 [IU] | SUBCUTANEOUS | @ 02:00:00

## 2023-01-09 MED ADMIN — sildenafiL (pulm.hypertension) (REVATIO) tablet 40 mg: 40 mg | ORAL | @ 13:00:00 | Stop: 2023-01-09

## 2023-01-09 MED ADMIN — sildenafiL (pulm.hypertension) (REVATIO) tablet 40 mg: 40 mg | ORAL | @ 01:00:00

## 2023-01-09 MED ADMIN — ipratropium-albuterol (DUO-NEB) 0.5-2.5 mg/3 mL nebulizer solution 3 mL: 3 mL | RESPIRATORY_TRACT | @ 12:00:00 | Stop: 2023-01-09

## 2023-01-09 MED ADMIN — gabapentin (NEURONTIN) capsule 100 mg: 100 mg | ORAL | @ 01:00:00

## 2023-01-09 MED ADMIN — posaconazole (NOXAFIL) delayed released tablet 200 mg: 200 mg | ORAL | @ 13:00:00 | Stop: 2023-01-09

## 2023-01-09 MED ADMIN — azithromycin (ZITHROMAX) tablet 250 mg: 250 mg | ORAL | @ 13:00:00 | Stop: 2023-01-09

## 2023-01-09 MED ADMIN — sildenafiL (pulm.hypertension) (REVATIO) tablet 40 mg: 40 mg | ORAL | @ 18:00:00 | Stop: 2023-01-09

## 2023-01-09 MED ADMIN — budesonide (PULMICORT) nebulizer solution 0.5 mg: .5 mg | RESPIRATORY_TRACT | @ 12:00:00 | Stop: 2023-01-09

## 2023-01-09 MED ADMIN — gabapentin (NEURONTIN) capsule 100 mg: 100 mg | ORAL | @ 18:00:00 | Stop: 2023-01-09

## 2023-01-09 MED ADMIN — insulin glargine (LANTUS) injection 12 Units: 12 [IU] | SUBCUTANEOUS | @ 01:00:00

## 2023-01-09 MED ADMIN — gabapentin (NEURONTIN) capsule 100 mg: 100 mg | ORAL | @ 13:00:00 | Stop: 2023-01-09

## 2023-01-09 MED ADMIN — sodium chloride 3 % NEBULIZER solution 4 mL: 4 mL | RESPIRATORY_TRACT

## 2023-01-09 NOTE — Unmapped (Signed)
Patient continued on 6-8L Large bore nasal cannula before resting on NPPV AVAPS 450 / X 8 / PMax 30 PMin 10 / +5 / 50% overnight this shift. See flow sheets for more detail. The patient received inhaled respiratory medications and Tyvaso as scheduled without event or adverse effect. No other changes or respiratory interventions made at this time. RT reassessed throughout the shift for changes in the patient's respiratory status.    Problem: Breathing Pattern Ineffective  Goal: Effective Breathing Pattern  Outcome: Ongoing - Unchanged     Problem: Noninvasive Ventilation Acute  Goal: Effective Unassisted Ventilation and Oxygenation  Outcome: Ongoing - Unchanged

## 2023-01-09 NOTE — Unmapped (Signed)
Pt is A&Ox4. NSR with HR 70s-80s. Bps stable with systolic's 100s-80s meeting goals. O2 sats stable on LBNC. Afebrile. No c/o of pain. UO adequate, 1 BM this shift. Pt's appetite has been adequate. Skin intact,  and standard precautions maintained. No falls/injuries this shift. On SQ heparin for DVT prophylaxis. All monitors with appropriate alarm settings. Tyvaso reps did stop by the bedside and complete the teaching. [employer] (home respiratory care therapist) stopped by and was not able to complete teaching because the machine is at home.     Problem: Adult Inpatient Plan of Care  Goal: Plan of Care Review  Outcome: Ongoing - Unchanged  Goal: Patient-Specific Goal (Individualized)  Outcome: Ongoing - Unchanged  Goal: Absence of Hospital-Acquired Illness or Injury  Outcome: Ongoing - Unchanged  Intervention: Identify and Manage Fall Risk  Recent Flowsheet Documentation  Taken 01/08/2023 0800 by Linward Foster, RN  Safety Interventions:   aspiration precautions   commode/urinal/bedpan at bedside   fall reduction program maintained   lighting adjusted for tasks/safety   low bed   infection management   nonskid shoes/slippers when out of bed  Intervention: Prevent Infection  Recent Flowsheet Documentation  Taken 01/08/2023 0800 by Linward Foster, RN  Infection Prevention:   environmental surveillance performed   personal protective equipment utilized   rest/sleep promoted   single patient room provided     Problem: Fall Injury Risk  Goal: Absence of Fall and Fall-Related Injury  Outcome: Ongoing - Unchanged  Intervention: Promote Scientist, clinical (histocompatibility and immunogenetics) Documentation  Taken 01/08/2023 0800 by Linward Foster, RN  Safety Interventions:   aspiration precautions   commode/urinal/bedpan at bedside   fall reduction program maintained   lighting adjusted for tasks/safety   low bed   infection management   nonskid shoes/slippers when out of bed     Problem: Skin Injury Risk Increased  Goal: Skin Health and Integrity  Outcome: Ongoing - Unchanged  Intervention: Optimize Skin Protection  Recent Flowsheet Documentation  Taken 01/08/2023 1600 by Linward Foster, RN  Pressure Reduction Techniques: frequent weight shift encouraged  Pressure Reduction Devices: pressure-redistributing mattress utilized  Taken 01/08/2023 1514 by Linward Foster, RN  Head of Bed [facility]) Positioning: HOB at 30-45 degrees  Taken 01/08/2023 1400 by Linward Foster, RN  Pressure Reduction Techniques: frequent weight shift encouraged  Pressure Reduction Devices: pressure-redistributing mattress utilized  Taken 01/08/2023 1200 by Linward Foster, RN  Pressure Reduction Techniques:   frequent weight shift encouraged   heels elevated off bed  Pressure Reduction Devices: pressure-redistributing mattress utilized  Taken 01/08/2023 1115 by Linward Foster, RN  Head of Bed [facility]) Positioning: HOB at 30-45 degrees  Taken 01/08/2023 1000 by Linward Foster, RN  Pressure Reduction Techniques:   frequent weight shift encouraged   heels elevated off bed  Pressure Reduction Devices: pressure-redistributing mattress utilized  Taken 01/08/2023 0800 by Linward Foster, RN  Pressure Reduction Techniques:   frequent weight shift encouraged   heels elevated off bed  Pressure Reduction Devices: pressure-redistributing mattress utilized  Taken 01/08/2023 0749 by Linward Foster, RN  Head of Bed (HOB) Positioning: HOB at 30-45 degrees

## 2023-01-09 NOTE — Unmapped (Signed)
1900-0700:    Pt is A&Ox4. 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 HR 70s-80s. Bps stable with systolic's >75 meeting goals. O2 sats stable on LBNC and AVAPS at night. Afebrile. No c/o of pain. UO adequate, no BM this shift. Pt's appetite has been adequate.Request late night meal. Pt got the grab and go. Skin intact,  and standard precautions maintained. No falls/injuries this shift. On SQ heparin for DVT prophylaxis. All monitors with appropriate alarm settings. Tyvaso treatment done by independently on RT supervision at the bedside last night.  Pt eager to get discharge home this morning.          Current Hospital Length of Stay: 41 Days    INFUSIONS:      VITALS:  Vitals:    01/04/23 1015 01/07/23 0640   Weight: 86.6 kg (190 lb 14.7 oz) 83.5 kg (184 lb 1.6 oz)      Weight change:      Temp:  [35.7 ??C (96.2 ??F)-36.7 ??C (98.1 ??F)] 36.3 ??C (97.3 ??F)  Heart Rate:  [76-86] 78  SpO2 Pulse:  [76-86] 78  Resp:  [22-31] 30  BP: (81-102)/(52-68) 95/62  MAP (mmHg):  [60-75] 72  FiO2 (%):  [50 %] 50 %  SpO2:  [87 %-98 %] 94 %     VENT/OXYGEN SETTINGS:  FiO2 (%): 50 %    INS & OUTS:  Date 01/08/23 0701 - 01/09/23 0700 01/09/23 0701 - 01/10/23 0700   Shift 0701-1900 1901-0700 24 Hour Total 0701-1900 1901-0700 24 Hour Total   INTAKE   P.O. 340 580 920      Shift Total 340 580 920      OUTPUT   Urine 800  800      Shift Total 800  800        Intake/Output Summary (Last 24 hours) at 01/09/2023 0510  Last data filed at 01/09/2023 0400  Gross per 24 hour   Intake 920 ml   Output 800 ml   Net 120 ml        ACCESS:  Patient Lines/Drains/Airways Status       Active Active Lines, Drains, & Airways       Name Placement date Placement time Site Days    External Urinary Device 12/02/22 With Suction 12/02/22  1900  -- 37    Peripheral IV 01/01/23 Posterior;Right Forearm 01/01/23  0543  Forearm  7                   Problem: Adult Inpatient Plan of Care  Goal: Plan of Care Review  Outcome: Progressing  Goal: Patient-Specific Goal (Individualized)  Outcome: Progressing  Goal: Absence of Hospital-Acquired Illness or Injury  Outcome: Progressing  Intervention: Identify and Manage Fall Risk  Recent Flowsheet Documentation  Taken 01/08/2023 2000 by Lilyan Gilford, RN  Safety Interventions:   aspiration precautions   commode/urinal/bedpan at bedside   lighting adjusted for tasks/safety   low bed   nonskid shoes/slippers when out of bed  Intervention: Prevent and Manage VTE (Venous Thromboembolism) Risk  Recent Flowsheet Documentation  Taken 01/08/2023 1938 by Lilyan Gilford, RN  VTE Prevention/Management:   fluids promoted   intravenous hydration  Intervention: Prevent Infection  Recent Flowsheet Documentation  Taken 01/08/2023 2000 by Lilyan Gilford, RN  Infection Prevention:   cohorting utilized   equipment surfaces disinfected   hand hygiene promoted   personal protective equipment utilized   rest/sleep promoted   single patient room provided  Goal: Optimal Comfort and Wellbeing  Outcome: Progressing  Goal: Readiness for Transition of Care  Outcome: Progressing  Goal: Rounds/Family Conference  Outcome: Progressing     Problem: Fall Injury Risk  Goal: Absence of Fall and Fall-Related Injury  Outcome: Progressing  Intervention: Promote Scientist, clinical (histocompatibility and immunogenetics) Documentation  Taken 01/08/2023 2000 by Lilyan Gilford, RN  Safety Interventions:   aspiration precautions   commode/urinal/bedpan at bedside   lighting adjusted for tasks/safety   low bed   nonskid shoes/slippers when out of bed     Problem: Gas Exchange Impaired  Goal: Optimal Gas Exchange  Outcome: Progressing  Intervention: Optimize Oxygenation and Ventilation  Recent Flowsheet Documentation  Taken 01/09/2023 0325 by Lilyan Gilford, RN  Head of Bed [facility]) Positioning: HOB at 20-30 degrees  Taken 01/08/2023 2348 by Lilyan Gilford, RN  Head of Bed [facility]) Positioning: HOB at 30 degrees  Taken 01/08/2023 2000 by Lilyan Gilford, RN  Head of Bed [facility]) Positioning: HOB at 30-45 degrees     Problem: Comorbidity Management  Goal: Blood Glucose Levels Within Targeted Range  Outcome: Progressing  Intervention: Monitor and Manage Glycemia  Recent Flowsheet Documentation  Taken 01/08/2023 2000 by Lilyan Gilford, RN  Glycemic Management: blood glucose monitored  Goal: Blood Pressure in Desired Range  Outcome: Progressing     Problem: Skin Injury Risk Increased  Goal: Skin Health and Integrity  Outcome: Progressing  Intervention: Optimize Skin Protection  Recent Flowsheet Documentation  Taken 01/09/2023 0325 by Lilyan Gilford, RN  Head of Bed [facility]) Positioning: HOB at 20-30 degrees  Taken 01/08/2023 2348 by Lilyan Gilford, RN  Head of Bed [facility]) Positioning: HOB at 30 degrees  Taken 01/08/2023 2000 by Lilyan Gilford, RN  Activity Management: back to bed  Pressure Reduction Techniques:   frequent weight shift encouraged   heels elevated off bed  Head of Bed (HOB) Positioning: HOB at 30-45 degrees  Pressure Reduction Devices: pressure-redistributing mattress utilized  Taken 01/08/2023 1938 by Lilyan Gilford, RN  Pressure Reduction Techniques:   frequent weight shift encouraged   heels elevated off bed  Pressure Reduction Devices: pressure-redistributing mattress utilized             See flowsheets/MAR for further information      Lilyan Gilford, RN  January 09, 2023 5:10 AM

## 2023-01-09 NOTE — Unmapped (Signed)
Physician Discharge Summary [facility]  [facility]  [address]  [place] [address]  Dept: [phone]  Loc: [phone]     Identifying Information:   Karen Barber  13-Dec-1965  [id number]    Primary Care Physician: Loran Senters, FNP     Code Status: DNR and DNI    Admit Date: 11/29/2022    Discharge Date: 01/09/2023     Discharge To: Home with Home Health and/or PT/OT    Discharge Service: [facility] - Infectious Disease Floor Team (MED K - Tower)     Discharge Attending Physician: Minerva Fester, MD    Discharge Diagnoses:   Principal Problem:    Acute on chronic hypoxic respiratory failure (CMS-HCC) (POA: Yes)  Active Problems:    Pulmonary sarcoidosis (CMS-HCC) (POA: Yes)    Type 2 diabetes mellitus with hyperglycemia (CMS-HCC) (POA: Yes)    Bronchiectasis without complication (CMS-HCC) (POA: Yes)    COPD (chronic obstructive pulmonary disease) (CMS-HCC) (POA: Yes)    Chronic pulmonary aspergillosis (CMS-HCC) (POA: Yes)    Pulmonary hypertension (CMS-HCC) (POA: Yes)    AKI (acute kidney injury) (CMS-HCC) (POA: Yes)    Secondary hyperparathyroidism (CMS-HCC) (POA: Unknown)    Iron deficiency anemia (POA: Unknown)  Resolved Problems:    * No resolved hospital problems. [facility] Course:   Outpatient Provider Follow-Up Issues:  []  Respiratory Failure: Adjust home oxygen requirements.  []  Pulmonary Hypertension: Tyvaso therapy and follow up with pulmonology.  []  CKD/AKI: Monitor renal function and adjust medications.  []  Hyperparathyroidism: Will need follow-up labs around week of 10/27 and adjustment in supplements based on results.  []  Diabetes: Continue Glargine 12 units nightly and SSI insulin with meals and monitor glucose.  []  Aspergillosis: Continue posaconazole; follow up with ID as scheduled.  []  Anemia: Monitor hemoglobin and continue iron supplementation.  []  Continue GOC conversations    Karen Barber is a 57 year old female with a history of bronchiectasis type 1, pulmonary sarcoidosis on home oxygen (6L [place]), COPD, s/p LUL lobectomy, asthma, HTN, and type 2 diabetes mellitus. She was admitted to the MICU after becoming hypoxic following a PET scan. After stabilization, she was transferred to the MPCU.     Acute on Chronic Hypoxemic Respiratory Failure - Pulmonary Hypertension  The patient presented with acute hypoxemia after holding her home medications prior to a PET scan. CXR showed extensive pulmonary fibrosis. PET scan on 9/18 was not indicative of active sarcoidosis. RHC on 9/20 revealed significant pulmonary hypertension with a mean PA pressure of 47 mmHg. She was started on Iloprost. She is now stable on 7-8 L [place], which may represent her new baseline oxygen requirement. Patient to continue home Pulmicort, Brovana, Atrovent, prn Albuterol, and Azithromycin upon discharge. She is discharging on Tyvaso nebulizer therapy and will follow up with pulmonology.    After recent admission with pneumonia growing Serratia marcescens. Patient held her morning medications prior to outpatient PET CT scan during which she became hypoxic to the 70s and admitted to MICU.  CXR consistent with extensive pulmonary fibrosis. She was weaned from high flow nasal cannula to large bore nasal cannula after IV diuresis and antibiotics. PET scan 9/18 is not indicative of active pulmonary sarcoidosis. RHC 9/20 showing significant pulm HTN disease with mean PA pressure of 47, for which pulmonology team started Iloprost. Notably, wedge pressure on that study was 5, which is reassuring against volume as driver of her respiratory status at that time. Patient has  been having variations in oxygen requirement.  Received IV diuresis followed by transition to oral diuresis with 120 mg Lasix BID.    Ongoing GOC conversations with patient throughout admission by [place] and by pulmonology. Referral placed for home palliative care upon discharge. On day of discharge, confirmed patient's desire to be DNR/DNI. She initially stated she would want CPR but not intubation. I explained that it is highly unlikely that she would be in a situation where CPR alone would be required and that she would recover. Upon further discussion, patient states she would not want to be resuscitated or intubated.    Acute Kidney Injury on CKD3  Patient's baseline creatinine seems roughly around 1.3-1.5.The patient had an AKI on admission with creatinine of 1.8. She was followed by nephrology and required IV diuresis initially. She was transitioned to oral diuresis with 120 mg PO Lasix BID. Renal function has continued to worsen throughout admission, with a current creatinine of 2.26. Permissive hypercreatinemia due to narrow euvolemia window with respiratory status. She will need close monitoring of renal function as an outpatient.    Secondary Hyperparathyroidism, Hypocalcemia, and Hyperphosphatemia  The patient has secondary hyperparathyroidism due to CKD. PTH of 224.7 during admission. She will need repeat labs the week of 10/27.    Type 2 Diabetes Mellitus with Hyperglycemia and Hypoglycemia  The patient has poorly controlled diabetes with significant fluctuations in glucose levels. She will continue Lantus 12 units nightly and SSI with meals on discharge, with close monitoring of blood sugars.    Chronic Pulmonary Aspergillosis  The patient has a history of chronic aspergillosis and is on posaconazole prophylaxis which was continued during admission.    Iron Deficiency Anemia  The patient has chronic normocytic anemia with a baseline hemoglobin of 11. She received iron supplementation during this admission and will need outpatient follow-up to re-evaluate further iron needs.    Left leg swelling  Patient endorsed intermittent left > right leg swelling. Venous doppler unremarkable.     Follow-Up Appointments:  Pulmonology: 01/31/23 with Dr. Ala Dach  Nephrology: Nephrology to call patient to schedule appointment in [facility]  Endocrinology: TBD  Primary Care Provider: 01/16/23 with Flossie Buffy, NP    The patient's hospital stay has been complicated by the following clinically significant conditions requiring additional evaluation and treatment or having a significant effect of this patient's care: - Chronic kidney disease POA requiring further investigation, treatment, or monitoring     Outpatient Provider Follow Up Issues:   []  Respiratory Failure: Adjust home oxygen requirements.  []  Pulmonary Hypertension: Tyvaso therapy and follow up with pulmonology.  []  CKD/AKI: Monitor renal function and adjust medications.  []  Hyperparathyroidism: Will need follow-up labs around week of 10/27 and adjustment in supplements based on results.  []  Diabetes: Continue Glargine 12 units nightly and SSI insulin with meals insulin and monitor glucose.  []  Aspergillosis: Continue posaconazole; follow up with ID as scheduled.  []  Anemia: Monitor hemoglobin and continue iron supplementation.    Touchbase with Outpatient Provider:  Warm Handoff: Completed on 01/09/23 by Sindy Guadeloupe, DO  (Intern) via [employer]    Procedures:  RHC, Echocardiogram  ______________________________________________________________________  Discharge Medications:     Your Medication List        STOP taking these medications      ipratropium 0.02 % nebulizer solution  Commonly known as: ATROVENT            START taking these medications      ipratropium-albuterol 0.5-2.5 mg/3  mL nebulizer  Commonly known as: DUO-NEB  Inhale 3 mL (contents of 1 vial) by nebulization every twelve (12) hours.     sodium chloride 3 % NEBULIZER solution  Inhale 4 mL by nebulization two (2) times a day.     treprostinil 1.74 mg/2.9 mL (0.6 mg/mL) Nebu  Commonly known as: TYVASO  3 puffs (18 mcg total) by inhalation device route every four (4) hours while awake.            CHANGE how you take these medications      BASAGLAR KWIKPEN U-100 INSULIN 100 unit/mL (3 mL) injection pen  Generic drug: insulin glargine  Inject 0.12 mL (12 Units total) under the skin nightly.  What changed: how much to take     furosemide 40 MG tablet  Commonly known as: LASIX  Take 3 tablets (120 mg total) by mouth two (2) times a day.  What changed:   medication strength  how much to take  additional instructions     gabapentin 100 MG capsule  Commonly known as: NEURONTIN  Take 1 capsule (100 mg total) by mouth Three (3) times a day.  What changed:   medication strength  how much to take     insulin aspart 100 unit/mL vial  Commonly known as: NovoLOG U-100 Insulin aspart  Inject 0.01-0.04 mL (1-4 Units total) under the skin Four (4) times a day (before meals and nightly). SLIDING SCALE:    BG 200-239 = 1 unit  BG 240-279 = 2 units  BG 280-300 = 3 units  BG > 300 = 4 units and notify provider  What changed:   how much to take  additional instructions     sildenafiL (pulm.hypertension) 20 mg tablet  Commonly known as: REVATIO  Take 2 tablets (40 mg total) by mouth Three (3) times a day.  What changed: how much to take            CONTINUE taking these medications      ACCU-CHEK GUIDE GLUCOSE METER Misc  Generic drug: blood-glucose meter  1 Device by Other route Four (4) times a day (before meals and nightly). AS DIRECTED     albuterol 2.5 mg /3 mL (0.083 %) nebulizer solution  Inhale 3 mL (2.5 mg total) by nebulization every six (6) hours as needed for wheezing or shortness of breath (airway clearance/cough).     albuterol 90 mcg/actuation inhaler  Commonly known as: PROVENTIL HFA;VENTOLIN HFA  Inhale 2 puffs every four (4) hours as needed for wheezing or shortness of breath.     arformoterol 15 mcg/2 mL  Commonly known as: BROVANA  Inhale 2 mL (15 mcg total) by nebulization in the morning and 2 mL (15 mcg total) in the evening.     azithromycin 250 MG tablet  Commonly known as: ZITHROMAX  Take 1 tablet (250 mg total) by mouth daily. Start on 9/1 after completing the levofloxacin     BAYER BACK AND BODY 500-32.5 mg Tab  Generic drug: aspirin-caffeine  Take by mouth. Takes 2 1/2 pills 3-4 times a day as needed.     blood sugar diagnostic Strp  by Other route Three (3) times a day. ACCU-CHEK Guide meter     budesonide 0.5 mg/2 mL nebulizer solution  Commonly known as: PULMICORT  Inhale 2 mL (0.5 mg total) by nebulization in the morning and 2 mL (0.5 mg total) in the evening.     OXYGEN-AIR DELIVERY SYSTEMS MISC  Dose: 2-3 LPM  posaconazole 100 mg delayed released tablet  Commonly known as: NOXAFIL  Take 2 tablets by mouth once daily              Allergies:  Patient has no known allergies.  ______________________________________________________________________  Pending Test Results:      Most Recent Labs:  All lab results last 24 hours -   Recent Results (from the past 24 hour(s))   Magnesium Level    Collection Time: 01/08/23  2:42 PM   Result Value Ref Range    Magnesium 2.1 1.6 - 2.6 mg/dL   Basic Metabolic Panel    Collection Time: 01/08/23  2:42 PM   Result Value Ref Range    Sodium 137 135 - 145 mmol/L    Potassium 4.2 3.4 - 4.8 mmol/L    Chloride 92 (L) 98 - 107 mmol/L    CO2 38.0 (H) 20.0 - 31.0 mmol/L    Anion Gap 7 5 - 14 mmol/L    BUN 72 (H) 9 - 23 mg/dL    Creatinine 1.30 (H) 0.55 - 1.02 mg/dL    BUN/Creatinine Ratio 31     eGFR CKD-EPI (2021) Female 24 (L) >=60 mL/min/1.32m2    Glucose 120 70 - 179 mg/dL    Calcium 9.9 8.7 - 86.5 mg/dL   Phosphorus Level    Collection Time: 01/08/23  2:42 PM   Result Value Ref Range    Phosphorus 5.0 2.4 - 5.1 mg/dL   POCT Glucose    Collection Time: 01/08/23  5:13 PM   Result Value Ref Range    Glucose, POC 99 70 - 179 mg/dL   POCT Glucose    Collection Time: 01/08/23  8:53 PM   Result Value Ref Range    Glucose, POC 173 70 - 179 mg/dL   POCT Glucose    Collection Time: 01/09/23  6:28 AM   Result Value Ref Range    Glucose, POC 122 70 - 179 mg/dL   Lactate, Venous, Whole Blood    Collection Time: 01/09/23  7:39 AM   Result Value Ref Range    Lactate, Venous 1.1 0.5 - 1.8 mmol/L   CBC    Collection Time: 01/09/23  7:39 AM   Result Value Ref Range    WBC 2.9 (L) 3.6 - 11.2 10*9/L    RBC 4.41 3.95 - 5.13 10*12/L    HGB 12.1 11.3 - 14.9 g/dL    HCT 78.4 69.6 - 29.5 %    MCV 84.5 77.6 - 95.7 fL    MCH 27.5 25.9 - 32.4 pg    MCHC 32.5 32.0 - 36.0 g/dL    RDW 28.4 (H) 13.2 - 15.2 %    MPV 7.9 6.8 - 10.7 fL    Platelet 146 (L) 150 - 450 10*9/L   Magnesium Level    Collection Time: 01/09/23  7:39 AM   Result Value Ref Range    Magnesium 2.1 1.6 - 2.6 mg/dL   Basic Metabolic Panel    Collection Time: 01/09/23  7:39 AM   Result Value Ref Range    Sodium 140 135 - 145 mmol/L    Potassium 3.8 3.4 - 4.8 mmol/L    Chloride 98 98 - 107 mmol/L    CO2 36.0 (H) 20.0 - 31.0 mmol/L    Anion Gap 6 5 - 14 mmol/L    BUN 72 (H) 9 - 23 mg/dL    Creatinine 4.40 (H) 0.55 - 1.02 mg/dL    BUN/Creatinine Ratio 32  eGFR CKD-EPI (2021) Female 24 (L) >=60 mL/min/1.32m2    Glucose 118 70 - 179 mg/dL    Calcium 9.9 8.7 - 16.1 mg/dL   Phosphorus Level    Collection Time: 01/09/23  7:39 AM   Result Value Ref Range    Phosphorus 5.2 (H) 2.4 - 5.1 mg/dL   POCT Glucose    Collection Time: 01/09/23  9:51 AM   Result Value Ref Range    Glucose, POC 129 70 - 179 mg/dL   POCT Glucose    Collection Time: 01/09/23  2:04 PM   Result Value Ref Range    Glucose, POC 138 70 - 179 mg/dL       Relevant Studies/Radiology:  ECG 12 Lead    Result Date: 01/03/2023  SINUS RHYTHM WITH 1ST DEGREE AV BLOCK INCOMPLETE RIGHT BUNDLE BRANCH BLOCK POSSIBLE RIGHT VENTRICULAR HYPERTROPHY ST & T WAVE ABNORMALITY, CONSIDER INFERIOR ISCHEMIA ST & T WAVE ABNORMALITY, CONSIDER ANTEROLATERAL ISCHEMIA ABNORMAL ECG WHEN COMPARED WITH ECG OF 14-Dec-2022 17:47, NO SIGNIFICANT CHANGE WAS FOUND Confirmed by Warnell Forester ([id number]) on 01/03/2023 10:10:38 AM    Echocardiogram Follow Up/Limited Echo    Result Date: 12/29/2022  Patient Info Name:     KHALAYA KROH Age:     57 years DOB:     September 05, 1965 Gender:     Female MRN:     [id number] Accession #:     [id number] [place] Account #: [id number] Ht:     180 cm Wt:     87 kg BSA:     2.10 m2 BP:     107 /     72 mmHg Exam Date:     12/28/2022 12:29 PM Admit Date:     11/29/2022 Exam Type:     ECHOCARDIOGRAM FOLLOW UP/LIMITED ECHO Technical Quality:     Fair Staff Sonographer:     Redmond Pulling Reading Fellow:     Danella Maiers MD Study Info Indications      - eval RV size and systolic function Procedure(s)   Limited 2D, color flow and Doppler transthoracic echocardiogram is performed. Summary   1. The left ventricle is relatively small in size with normal wall thickness.   2. The left ventricular systolic function is normal, LVEF is visually estimated at 55-60%.   3. The aortic valve is trileaflet with moderately thickened leaflets with normal excursion.   4. The right ventricle is severely dilated in size, with moderately reduced systolic function.   5. There is moderate tricuspid regurgitation.   6. There is severe pulmonary hypertension.   7. The right atrium is severely dilated in size.   8. IVC size and inspiratory change suggest elevated right atrial pressure. (10-20 mmHg). Left Ventricle   The left ventricle is relatively small in size with normal wall thickness. The left ventricular systolic function is normal, LVEF is visually estimated at 55-60%. Right Ventricle   The right ventricle is severely dilated in size, with moderately reduced systolic function. Ventricular Septum   Abnormal ventricular septal motion consistent with RV pressure and volume overload. Left Atrium   The left atrium is relatively small in size. Right Atrium   The right atrium is severely dilated in size. Aortic Valve   The aortic valve is trileaflet with moderately thickened leaflets with normal excursion. Mitral Valve   The mitral valve leaflets are normal with normal leaflet mobility. Tricuspid Valve   The tricuspid valve leaflets are normal, with normal leaflet mobility. There is moderate tricuspid regurgitation. There is  severe pulmonary hypertension. TR maximum velocity: 4.1 m/s  Estimated PASP: 84 mmHg. Pulmonic Valve   The pulmonic valve is normal. There is no significant pulmonic regurgitation. There is no evidence of a significant transvalvular gradient. Inferior Vena Cava   IVC size and inspiratory change suggest elevated right atrial pressure. (10-20 mmHg). Pericardium/Pleural   There is no pericardial effusion. Other Findings   Rhythm: Sinus Rhythm. Ventricles ---------------------------------------------------------------------- Name                                 Value        Normal ---------------------------------------------------------------------- RV Dimensions 2D/MM ----------------------------------------------------------------------  RV Basal Diastolic Dimension                           7.7 cm       2.5-4.1 TAPSE                               1.7 cm         >=1.7 Atria ---------------------------------------------------------------------- Name                                 Value        Normal ---------------------------------------------------------------------- RA Dimensions ---------------------------------------------------------------------- RA Area (4C)                      30.5 cm2        <=18.0 RA Area (4C) Index              14.6 cm2/m2               RA ESV Index (4C MOD)             69 ml/m2         15-27 Tricuspid Valve ---------------------------------------------------------------------- Name                                 Value        Normal ---------------------------------------------------------------------- TV Regurgitation Doppler ---------------------------------------------------------------------- TR Peak Velocity                   4.1 m/s               Estimated PAP/RSVP ---------------------------------------------------------------------- RA Pressure                        15 mmHg           <=5 RV Systolic Pressure               84 mmHg           <36 Pulmonic Valve ---------------------------------------------------------------------- Name                                 Value        Normal ---------------------------------------------------------------------- PV Doppler ---------------------------------------------------------------------- PV Peak Velocity                   0.8 m/s Venous ---------------------------------------------------------------------- Name  Value        Normal ---------------------------------------------------------------------- IVC/SVC ---------------------------------------------------------------------- IVC Diameter (Exp 2D)               2.9 cm         <=2.1 Report Signatures Finalized by Carin Hock  MD on 12/29/2022 11:20 AM Resident Danella Maiers  MD on 12/28/2022 01:50 PM    US Renal Complete    Result Date: 12/26/2022  EXAM: US RENAL COMPLETE ACCESSION: [id number] UN CLINICAL INDICATION: 57 years old with rising Cr r/o obstruction  COMPARISON: 12/06/2022 PET/CT TECHNIQUE: Static and cine images of the kidneys and bladder were performed. FINDINGS: KIDNEYS: Normal size and echogenicity. No solid masses or calculi. No hydronephrosis.      Right kidney: 9.7 cm      Left kidney: 9.7 cm BLADDER: Small volume of echogenic debris within the bladder, nonspecific.      Bladder volume prevoid: 125.4 mL     No hydronephrosis.     XR Chest Portable    Result Date: 12/25/2022  EXAM: XR CHEST PORTABLE ACCESSION: [id number] UN CLINICAL INDICATION: HYPOXEMIA  TECHNIQUE: Single View AP Chest Radiograph. COMPARISON: Chest radiograph 12/22/2022. FINDINGS: Left upper lobectomy. Unchanged severe bullous emphysema. Unchanged basilar reticulations and traction bronchiectasis. No pleural effusion or pneumothorax. Unchanged elevation of the left hemidiaphragm. Cardiac silhouette is unchanged in size.     Little interval change from prior.    XR Chest Portable    Result Date: 12/22/2022  EXAM: XR CHEST PORTABLE ACCESSION: [id number] [place] CLINICAL INDICATION: SHORTNESS OF BREATH  TECHNIQUE: Single View AP Chest Radiograph. COMPARISON: None FINDINGS: Left upper lobectomy. Severe bullous emphysema. Bibasilar reticulations and traction bronchiectasis.Marland Kitchen No pleural effusion or pneumothorax. Unchanged cardiomediastinal silhouette.     Unchanged combined pulmonary fibrosis and emphysema with biapical bullae.    XR Chest Portable    Result Date: 12/16/2022  EXAM: XR CHEST PORTABLE ACCESSION: [id number] UN CLINICAL INDICATION: HYPOXEMIA  TECHNIQUE: Single View AP Chest Radiograph. COMPARISON: HRCT 12/13/2022, chest radiograph 12/13/2022 FINDINGS: Left upper lobectomy. There are large biapical bullous better characterized on the prior CT chest. Bilateral perihilar and infrahilar coarse interstitial opacities with bronchiectasis. Cardiac silhouette is obscured by left basilar opacities and elevated left hemidiaphragm.     Fibrotic interstitial lung disease with biapical large bullae which are better characterized on the prior CT chest. No significant interval changes.    ECG 12 Lead    Result Date: 12/14/2022  SINUS RHYTHM WITH 1ST DEGREE AV BLOCK POSSIBLE LEFT ATRIAL ENLARGEMENT RIGHT AXIS DEVIATION INCOMPLETE RIGHT BUNDLE BRANCH BLOCK POSSIBLE RIGHT VENTRICULAR HYPERTROPHY ST & T WAVE ABNORMALITY, CONSIDER INFERIOR ISCHEMIA ST & T WAVE ABNORMALITY, CONSIDER ANTEROLATERAL ISCHEMIA PROLONGED QT ABNORMAL ECG WHEN COMPARED WITH ECG OF 10-Dec-2022 13:38, NO SIGNIFICANT CHANGE WAS FOUND Confirmed by Eldred Manges [phone]) on 12/14/2022 10:37:16 PM    ECG 12 Lead    Result Date: 12/13/2022  SINUS RHYTHM WITH 1ST DEGREE AV BLOCK POSSIBLE LEFT ATRIAL ENLARGEMENT RIGHT AXIS DEVIATION INCOMPLETE RIGHT BUNDLE BRANCH BLOCK POSSIBLE RIGHT VENTRICULAR HYPERTROPHY MARKED ST ABNORMALITY, POSSIBLE SEPTAL SUBENDOCARDIAL INJURY ABNORMAL ECG WHEN COMPARED WITH ECG OF 05-Dec-2022 19:21, NO SIGNIFICANT CHANGE WAS FOUND Confirmed by Vickey Huger [phone]) on 12/13/2022 9:50:53 PM    CT Chest High Resolution Wo Contrast    Result Date: 12/13/2022  EXAM: CT CHEST HIGH RESOLUTION WO CONTRAST ACCESSION: [id number] UN CLINICAL INDICATION: Sarcoidosis TECHNIQUE: Non-contrast CT Chest was performed.  Contiguous conventional and high resolution axial images were reconstructed through the chest following a single breath hold helical acquisition.  Images were reformatted in the sagittal and coronal planes.  Expiratory and prone images were obtained following additional breath holds. COMPARISON: PET/CT 12/06/2022 and CTA chest 10/21/2022 FINDINGS: LUNGS, AIRWAYS, AND PLEURA: Post left upper lobectomy. Unchanged apical right upper lobe bulla and superior segment of left lower lobe bulla measuring 7.5 cm and 10.6 cm respectively. Unchanged segmental and subsegmental fibrosis predominantly in the right upper lobe, right middle lobe and bibasilar lower lobes with associated traction bronchiectasis. Expiratory images show mild air trapping in both lungs. Central airways are patent. No pleural effusion or pneumothorax. Post decortication MEDIASTINUM AND LYMPH NODES: Unchanged bulky eggshell calcified mediastinal and hilar lymphadenopathy. HEART AND VASCULATURE: Cardiac chambers are normal in size. No pericardial effusion. Aorta is normal in caliber. Main pulmonary artery is enlarged, measuring 4.4 cm. CHEST WALL AND BONES: No suspicious lytic or sclerotic osseous lesions. Thoracic spine appears normal. Chest wall appears normal. UPPER ABDOMEN: Visualized upper abdomen appears normal. OTHER: Unchanged supraclavicular and lower cervical lymphadenopathy. No axillary lymphadenopathy. Thyroid appears normal.     Unchanged fibrotic pulmonary sarcoidosis with bilateral segmental and subsegmental fibrosis post left upper lobectomy with unchanged apical right upper lobe and superior segment left lower lobe bullae.     XR Chest Portable    Result Date: 12/13/2022  EXAM: XR CHEST PORTABLE ACCESSION: [id number] [place] CLINICAL INDICATION: HYPOXEMIA  TECHNIQUE: Single View AP Chest Radiograph. COMPARISON: Chest radiograph 11/29/2022. FINDINGS: Unchanged bilateral coarse reticulations and scarring within the left greater than right lung. Similar appearance of biapical bulla. Unchanged asymmetric elevation of the left hemidiaphragm. Small left pleural effusion. No pneumothorax. Partially obscured cardiomediastinal silhouette.     No significant interval change since the prior chest radiograph.    ECG 12 Lead    Result Date: 12/10/2022  SINUS RHYTHM WITH 1ST DEGREE AV BLOCK RIGHT AXIS DEVIATION INCOMPLETE RIGHT BUNDLE BRANCH BLOCK ST-T WAVE ABNORMALITIES , CONSIDER ISCHEMIA ABNORMAL ECG WHEN COMPARED WITH ECG OF 01-Dec-2022 12:00, NO SIGNIFICANT CHANGE WAS FOUND Confirmed by Huel Coventry [phone]) on 12/10/2022 9:11:40 PM    PVL Venous Duplex Lower Extremity Bilateral    Result Date: 12/09/2022   Peripheral Vascular Lab     [address]   [place], [place] [address]  PVL VENOUS DUPLEX LOWER EXTREMITY BILATERAL Patient Demographics Pt. Name: ARYIANA DEGLER Location: PVL Inpatient Bedside MRN:      [id number]           Sex:      F DOB:      [birth year]         Age:      4 years  Study Information Authorizing         [id number] [place]        Performed Time       12/08/2022 2:49:58 Provider Name       CISNEROS                                   PM Ordering Physician  Conception Chancy      Patient Location     [facility] Accession Number    [id number] [facility]         Technologist         Glennon Hamilton  RVT Diagnosis:                                Dispensing optician Ordered Reason For Exam: swelling Indication: Swelling Risk Factors: Surgery (s/p morning of 12/08/22 RT heart cath ). Anticoagulation: (Lovenox). Protocol The major deep veins from the inguinal ligament to the ankle are assessed for bilaterally for compressibility and color and spectral Doppler flow characteristics. The assessed veins include bilateral common femoral vein, femoral vein in the thigh, popliteal vein, and intramuscular calf veins. The iliac vein is assessed indirectly using Doppler waveform analysis. The great saphenous vein is assessed for compressibility at the saphenofemoral junction, and the small saphenous vein assessed for compressibility behind the knee.  Final Interpretation Right There is no evidence of DVT in the lower extremity. There is no evidence of obstruction proximal to the inguinal ligament or in the common femoral vein. Left There is no evidence of DVT in the lower extremity. There is no evidence of obstruction proximal to the inguinal ligament or in the common femoral vein.  Electronically signed by [id number] Jodell Cipro MD on 12/09/2022 at 8:15:25 AM.  -------------------------------------------------------------------------------- Right Duplex Findings All veins visualized appear fully compressible. Doppler flow signals demonstrate normal spontaneity, phasicity, and augmentation.  Left Duplex Findings All veins visualized appear fully compressible. Doppler flow signals demonstrate normal spontaneity, phasicity, and augmentation. Right Technical Summary No evidence of deep venous obstruction in the lower extremity. No indirect evidence of obstruction proximal to the inguinal ligament. Left Technical Summary No evidence of deep venous obstruction in the lower extremity. No indirect evidence of obstruction proximal to the inguinal ligament. Final    Cath/Vascular Procedure    Result Date: 12/08/2022  FINAL CARDIAC CATHETERIZATION REPORT CONCLUSIONS: - Severe pulmonary Hypertension - Markedly elevated PVR (9.3 - 10.5) - Low cardiac output / index RECOMMENDATIONS: - Per inpatient team PATIENT NAME: Kimani, Ping INDICATION: 57 year old female with pulmonary hypertension PROCEDURE DATE: 2022-12-08 ACCESS SITE: right internal jugular vein PHYSICIANS: Verdell Carmine (Fellow - Diagnostic), Angelita Ingles (Attending) REFERRING: [facility] Diagnostic procedures: right heart cath Contrast Used (ml): none reported Hemodynamics: BP / Ao (mmHg): 104/77  Mean: 86  Right Heart: RA: (Mean): 6  (a-wave): 12  (v-wave): 7 RV: 85/10 PA: 82/38  Mean: 47 Wedge: (Mean): 7  (a-wave): 12  (v-wave): 12 Ao sat(%): 97  PA sat(%): 51 Cardiac Output (Fick): 4.3 L/Min Cardiac Index (Fick): 2.1 L/Min/M^2 SVR (Fick): 1488 PVR (Fick): 9.30 Cardiac Output (thermal): 3.8 L/MinCardiac Index (thermal): 1.8 L/Min/M^2 SVR (thermal): 1684PVR (thermal): 10.53 Technique: The right internal jugular vein was accessed using micropuncture technique. Ultrasound was used for venous access. Catheters used: 19F thermodilution Complications: none reported Estimated blood loss: < 30 cc Radiation: Fluoro time (min): 2.1, Air Kerma Dose (mGy): 13.2, DAP (Gy-cm2): 2.6 I have reviewed the recent history physical and documentation. No sedation was given independent observer RN was present for the duration of the procedure to assist in patient monitoring. I (Dr. Angelita Ingles) was present for the entire procedure. 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 treatment strategy. RADIOPHARMACEUTICAL: F-18 Fluorodeoxyglucose (FDG), IV  TECHNIQUE: Following the administration of radiopharmaceutical, PET images were acquired using 3D-acquisition and reconstructed with attenuation-correction.  A single-breathhold CT scan was obtained at quiet end-expiration without oral or IV contrast for anatomic localization and attenuation-correction. Additional dedicated head and neck PET/CT images were acquired. The coregistered PET and CT images were evaluated in axial, coronal, and sagittal planes.  Scanner:  Siemens Biograph mCT Injected activity: 12.97 mCi Uptake time: 73 minutes Site of injection: Right antecubital Serum glucose: 102 mg/dL COMPARISON: PET/CT dated 12/09/2018, CT chest dated 8/18/10/21/2022  FINDINGS: HEAD, NECK and SUPRACLAVICULAR REGIONS: Grossly unchanged metabolically active right supraclavicular lymph nodes, accounting for differences in positioning of the arms and lack of IV contrast. CHEST: Thyroid: Grossly unremarkable. Breasts: Grossly unremarkable. Lungs and Pleura: Biapical bulla. Sequelae of left upper lobectomy. Bronchiectasis and partially metabolically active reticular opacities are seen bilaterally, most prominent in the lower lobes, overall similar to prior. No pleural effusion. Mediastinum, Hila and Axillae: Bulky, partially calcified lymph nodes in the mediastinum and bilateral hilar with no significant to mild uptake, decreased in intensity since prior. For reference 3.0 cm right paratracheal node (CT 105). Cardiovascular: Moderate coronary calcifications. No pericardial effusion. Dilated main pulmonary artery, measuring up to 4.0 cm, suggestive of pulmonary arterial hypertension. ABDOMEN and PELVIS: Liver: No suspicious hypermetabolic lesions. Gallbladder: No cholelithiasis. Spleen: No suspicious hypermetabolic lesions or diffuse uptake. No splenomegaly. Pancreas: No suspicious hypermetabolic lesions. GI/Peritoneum/Mesentery: No suspicious hypermetabolic lesions or diffuse uptake. Adrenals: No suspicious hypermetabolic lesions. Kidneys: Grossly unremarkable. GU: Grossly unremarkable. Adenopathy: No suspicious hypermetabolic lymph nodes. Vasculature: Scattered calcifications of the abdominal aorta and its branch vessels. MUSCULOSKELETAL: No suspicious osseous or soft tissue lesions. Uptake in bilateral paraspinal muscles (CT 86), likely benign.     Bulky, partially calcified lymph nodes in the mediastinum and bilateral hilar with no significant to mild uptake, decreased in intensity since prior. Grossly unchanged metabolically active right supraclavicular lymph nodes, accounting for differences in positioning of the arms and lack of IV contrast. Bronchiectasis and partially metabolically active reticular opacities are seen bilaterally, most prominent in the lower lobes, overall similar to prior. Dilated main pulmonary artery measuring up to 4.0 cm, suggestive of pulmonary arterial hypertension.     Echocardiogram Follow Up/Limited Echo    Result Date: 12/05/2022  Patient Info Name:     SHAMONI MEARS Age:     57 years DOB:     1965/05/29 Gender:     Female MRN:     [id number] Accession #:     [id number] UN Account #:     [id number] Ht:     180 cm Wt:     84 kg BSA:     2.06 m2 BP:     160 /     80 mmHg Exam Date:     12/05/2022 9:03 AM Admit Date:     11/29/2022 Exam Type:     ECHOCARDIOGRAM FOLLOW UP/LIMITED ECHO Technical Quality:     Fair Staff Sonographer:     Leola Brazil Study Info Indications      - eval pulm htn Procedure(s)   Limited 2D, color flow and Doppler transthoracic echocardiogram is performed. Summary   1. Limited study.   2. The left ventricle is normal in size with normal wall thickness.   3. The left ventricular systolic function is normal, LVEF is visually estimated at >  55%.   4. The right ventricle is severely dilated in size, with mildly reduced systolic function.   5. There is severe pulmonary hypertension.   6. TR maximum velocity: 4.4 m/s  Estimated PASP: 86 mmHg.   7. IVC size and inspiratory change suggest mildly elevated right atrial pressure. (5-10 mmHg).   8. No significant change since prior study from 10/23/2022. Left Ventricle   The left ventricle is normal in size with normal wall thickness. The left ventricular systolic function is normal, LVEF is visually estimated at > 55%. Right Ventricle   The right ventricle is severely dilated in size, with mildly reduced systolic function. Ventricular Septum   Abnormal ventricular septal motion consistent with RV volume overload (diastolic flattening). Left Atrium   The left atrium is normal in size. Right Atrium   The right atrium is moderately dilated in size. Mitral Valve   The mitral valve leaflets are normal with normal leaflet mobility. Tricuspid Valve   The tricuspid valve leaflets are normal, with normal leaflet mobility. There is moderate tricuspid regurgitation. There is severe pulmonary hypertension. TR maximum velocity: 4.4 m/s  Estimated PASP: 86 mmHg. Pulmonic Valve   The pulmonic valve is poorly visualized, but probably normal. There is mild pulmonic regurgitation. Inferior Vena Cava   IVC size and inspiratory change suggest mildly elevated right atrial pressure. (5-10 mmHg). Other Findings   Rhythm: Atrial Fibrillation. Ventricles ---------------------------------------------------------------------- Name                                 Value        Normal ---------------------------------------------------------------------- RV Dimensions 2D/MM ---------------------------------------------------------------------- TAPSE                               3.4 cm         >=1.7 Tricuspid Valve ---------------------------------------------------------------------- Name                                 Value        Normal ---------------------------------------------------------------------- TV Regurgitation Doppler ---------------------------------------------------------------------- TR Peak Velocity                   4.4 m/s               Estimated PAP/RSVP ---------------------------------------------------------------------- RA Pressure                         8 mmHg           <=5 RV Systolic Pressure               86 mmHg           <36 Report Signatures Finalized by Lavonna Monarch on 12/05/2022 01:18 PM Resident Darryl Hinda Lenis  MD on 12/05/2022 10:36 AM    ECG 12 Lead    Result Date: 12/01/2022  SINUS RHYTHM WITH 1ST DEGREE AV BLOCK POSSIBLE LEFT ATRIAL ENLARGEMENT RIGHT AXIS DEVIATION INCOMPLETE RIGHT BUNDLE BRANCH BLOCK RIGHT VENTRICULAR HYPERTROPHY ST & T WAVE ABNORMALITY, CONSIDER INFERIOR ISCHEMIA ST & T WAVE ABNORMALITY, CONSIDER ANTEROLATERAL ISCHEMIA ABNORMAL ECG WHEN COMPARED WITH ECG OF 01-Dec-2022 11:59, PREMATURE VENTRICULAR BEATS ARE NO LONGER PRESENT Confirmed by Eldred Manges ([id number]) on 12/01/2022 8:01:48 PM    ECG 12 Lead    Result Date: 11/30/2022  SINUS RHYTHM WITH 1ST DEGREE AV BLOCK RIGHT AXIS DEVIATION INCOMPLETE RIGHT BUNDLE BRANCH BLOCK RIGHT VENTRICULAR HYPERTROPHY ST & T WAVE ABNORMALITY, CONSIDER INFERIOR ISCHEMIA ST & T WAVE ABNORMALITY, CONSIDER ANTEROLATERAL ISCHEMIA ABNORMAL ECG WHEN COMPARED WITH ECG OF 07-Nov-2022 14:10, NO SIGNIFICANT CHANGE WAS FOUND Confirmed by Mariane Baumgarten ([id number]) on 11/30/2022 8:14:59 AM    XR Chest Portable    Result Date: 11/29/2022  EXAM: XR CHEST PORTABLE ACCESSION: [id number] UN CLINICAL INDICATION: SHORTNESS OF BREATH  TECHNIQUE: Single View AP Chest Radiograph. COMPARISON: Chest radiograph 11/19/2021 FINDINGS: Bilateral coarse reticulations with volume loss and architectural distortion left greater than right. Biapical bulla appear unchanged. Persistent left hemidiaphragm elevation. No pleural effusion or pneumothorax. Stable bilateral hilar fullness.     Extensive fibrotic changes and biapical bulla appear grossly unchanged. Extensive pulmonary fibrosis limits evaluation for superimposed infection.   ______________________________________________________________________  Discharge Instructions:   Activity Instructions       Activity as tolerated                  Resources and Referrals       Home Ventilator and Battery Pack      Length of Need: 99    Non-invasive ventilator     Target TV 6-63ml/kg, 450cc  PS Max 30  PS min 5  EPAP max 15  EPAP min 5  Breath rate: 20        Non-invasive ventilator     Target TV 6-61ml/kg, 450cc  PS Max 30  PS min 5  EPAP max 15  EPAP min 5  Breath rate: 20    Oxygen      Oxygen Type: Continuous    % saturation on room air at rest: 71    Provide Continous Oxygen at (LPM): 8    Oxygen device delivery method: Nasal Cannula    Type of portable O2 tank and concentrator to deliver: Gaseous    Other Orders: Optional Systems for Oxygen delivery    Optional System for Delivery: Other (please specify) Comment - Oxymizer    Length of Need: lifetime    Qualifying SAT levels must be within 48 hours of discharge    Additional Information:  N/A        Qualifying SAT levels must be within 48 hours of discharge    Additional Information:  N/A             Follow Up instructions and Outpatient Referrals     Ambulatory Referral to Home Health      Reason for referral: deconditioned    Physician to follow patient's care: PCP    Disciplines requested:  Occupational Therapy  Physical Therapy       Physical Therapy requested: Evaluate and treat    Occupational Therapy Requested: Evaluate and treat    Ambulatory Referral to Palliative Care      Reason for referral: 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 On last line PH therapies with narrow evolemia window.   Dyspnea is primary sx. Main goal of making it to her wedding in May 2025.    Is this a palliative referral for:  symptom management  other (please list in comments)       [place] [facility] [place] (Non-Cancer Diagnosis)    [place] P[facility]IATIVE CARE ONCOLOGY  (Cancer Diagnosis)    [place] [facility] [place] (Cancer Diagnosis - [place])      [place] [facility] (Home-Based)  [facility] CHILDRENS PALLIATIVE CARE [place] (Pediatrics)    *For [facility] Palliative Care Programs, contact the organization directly   for external referral process    Ambulatory Referral to Palliative Care      Reason for referral: end stage PAH from pulm sarcoid    Is this a palliative referral for:  symptom management  goals of care  coping support       [place] [facility]IATIVE CARE [place] (Non-Cancer Diagnosis)    [place] [facility]ALLIATIVE CARE ONCOLOGY [place] (Cancer Diagnosis)    [place] [facility] [place] (Cancer Diagnosis - [place])      [place] [facility]ALLI[facility] HOME [facility] (Home-Based)    [facility] CHILDRENS PALLIATIVE CARE [place] (Pediatrics)    *For [facility] Palliative Care Programs, contact the organization directly   for external referral process    Call MD for:  difficulty breathing, headache or visual disturbances      Call MD for:  extreme fatigue      Call MD for:  persistent dizziness or light-headedness      Call MD for:  persistent nausea or vomiting      Call MD for:  severe uncontrolled pain      Call MD for: Temperature > 38.5 Celsius ( > 101.3 Fahrenheit)      Discharge instructions          Appointments which have been scheduled for you      Jan 16, 2023 1:00 PM  (Arrive by 12:45 PM)  RETURN CARE TRANSITIONS [place] with Desmond Dike, NP  [facility] PRIMARY CARE [address] AT [facility] REGION) [address] [place] [address]  [phone]        Jan 31, 2023 11:00 AM  (Arrive by 10:45 AM)  NEW  PULM HYPERTENSION with Jettie Booze, MD  [facility] PULMONARY SPECIALTY CL [place] [facility] REGION) [address]  [facility] 1 through 4  [place] [address]  [phone]        Mar 23, 2023 12:30 PM  (Arrive by 12:15 PM)  RETURN  PULMONARY with Charna Busman, MD  [facility] PULMONARY SPECIALTY CL [place] [facility] REGION) [address]  [facility] 1 through 4  [place] [address]  [phone]        Mar 27, 2023 11:00 AM  (Arrive by 10:45 AM)  RETURN  GENERAL with Neysa Hotter, MD  [place] [facility] REGION) [address]  [place] [address]  [phone]        May 29, 2023 11:00 AM  (Arrive by 10:45 AM)  RETURN BRONCHIECTSIS with Satira Sark, MD  [facility] PULMONARY SPECIALTY CL [place] [facility] REGION) [address]  [facility] 1 through 4  [place] [address]  [phone]             ______________________________________________________________________  Discharge Day Services:  BP 89/64  - Pulse 85  - Temp 36.3 ??C (97.3 ??F) (Oral)  - Resp 19  - Ht 180.3 cm (5' 11)  - Wt 83.5 kg (184 lb 1.6 oz)  - LMP  (LMP Unknown)  - SpO2 90%  - BMI 25.68 kg/m??     Pt seen on the day of discharge and determined appropriate for discharge.    Condition at Discharge: stable    Length of Discharge: I spent greater than 30 mins  in the discharge of this patient.

## 2023-01-15 NOTE — Unmapped (Signed)
Copied from CRM #[id number]. Topic: Other - Other  >> Jan 15, 2023 11:30 AM Wilnette Kales wrote:  Lindie Spruce with [facility] is requesting for the Provider to contact them in regards to need death certificate signed by PCP.  Please contact Lindie Spruce with [facility] by phone [phone]    Routine callback turnaround time: 24-48 business hours. Programmer, systems Notified)

## 2023-01-15 NOTE — Unmapped (Signed)
Note from [facility]-        Patient Karen Barber 05-Sep-1965 has been unreachable for nursing for the first home visit follow up for the [place]. Voicemail full and as of this morning she has call restrictions on her phone.

## 2023-01-16 NOTE — Unmapped (Signed)
Spoke to [address] [place] and [place] and the confirmed the correct spelling of Etheleen Nicks. I also informed them of the issue Lorina Rabon was having logging in and that she has contacted the support team.  Hopefully this will resolve the issue. amyers

## 2023-01-16 NOTE — Unmapped (Addendum)
Joni Reining called, they are unable to find Center in [facility] [place]. Can you confirm how your name is listed?     Thanks,  Morrie Sheldon

## 2023-01-16 NOTE — Unmapped (Signed)
Phone note    It appears this patient died at home, 2023-01-24.    Likely Cause of Death:    1. Acute Respiratory Failure    2. Pulmonary Sarcoidosis    3. Diabetes, Type 2, with hyperglycemia    4. COPD       Deceased [facility] to [facility]    Contact-Nicole Torain    [phone]    Spoke with [facility].    Campbell DAVE forwarded to Debbora Dus for completion. No response.    Betha Loa, DNP

## 2023-01-17 NOTE — Unmapped (Signed)
Copied from CRM #[id number]. Topic: Access To Clinicians - Req Clinic Call Back  >> Jan 17, 2023 12:22 PM Andy Gauss wrote:  Joni Reining with [facility] is requesting for the Nurse to contact them in regards to needing Keri Mcdonald to sign the death certificate as soon as possible. Time of death was 7:45am and cause of death was natural causes.  Please contact Joni Reining with [facility] by [phone]      Urgent callback turnaround time: within 24 business hours. Programmer, systems Notified)
